# Patient Record
Sex: Male | Born: 1935
Health system: Southern US, Community
[De-identification: ages and names within clinical notes are randomized; demographics above are authoritative.]

## PROBLEM LIST (undated history)

## (undated) DIAGNOSIS — G609 Hereditary and idiopathic neuropathy, unspecified: Secondary | ICD-10-CM

## (undated) DIAGNOSIS — G4733 Obstructive sleep apnea (adult) (pediatric): Secondary | ICD-10-CM

## (undated) DIAGNOSIS — I2510 Atherosclerotic heart disease of native coronary artery without angina pectoris: Secondary | ICD-10-CM

## (undated) DIAGNOSIS — IMO0002 Reserved for concepts with insufficient information to code with codable children: Secondary | ICD-10-CM

## (undated) DIAGNOSIS — G2581 Restless legs syndrome: Secondary | ICD-10-CM

## (undated) DIAGNOSIS — I10 Essential (primary) hypertension: Secondary | ICD-10-CM

## (undated) DIAGNOSIS — M25559 Pain in unspecified hip: Secondary | ICD-10-CM

## (undated) DIAGNOSIS — L02419 Cutaneous abscess of limb, unspecified: Secondary | ICD-10-CM

## (undated) DIAGNOSIS — J189 Pneumonia, unspecified organism: Secondary | ICD-10-CM

## (undated) DIAGNOSIS — L03119 Cellulitis of unspecified part of limb: Secondary | ICD-10-CM

## (undated) DIAGNOSIS — E119 Type 2 diabetes mellitus without complications: Secondary | ICD-10-CM

## (undated) DIAGNOSIS — I319 Disease of pericardium, unspecified: Secondary | ICD-10-CM

## (undated) HISTORY — DX: Cutaneous abscess of limb, unspecified: L02.419

## (undated) HISTORY — DX: Reserved for concepts with insufficient information to code with codable children: IMO0002

## (undated) HISTORY — DX: Restless legs syndrome: G25.81

## (undated) HISTORY — DX: Hereditary and idiopathic neuropathy, unspecified: G60.9

## (undated) HISTORY — DX: Obstructive sleep apnea (adult) (pediatric): G47.33

## (undated) HISTORY — DX: Disease of pericardium, unspecified: I31.9

## (undated) HISTORY — DX: Pneumonia, unspecified organism: J18.9

## (undated) HISTORY — DX: Type 2 diabetes mellitus without complications: E11.9

## (undated) HISTORY — PX: ANAL FISSURE REPAIR: SHX2312

## (undated) HISTORY — DX: Cutaneous abscess of limb, unspecified: L03.119

## (undated) HISTORY — DX: Pain in unspecified hip: M25.559

---

## 1999-10-11 ENCOUNTER — Ambulatory Visit (HOSPITAL_BASED_OUTPATIENT_CLINIC_OR_DEPARTMENT_OTHER): Admission: RE | Admit: 1999-10-11 | Discharge: 1999-10-11 | Payer: Self-pay | Admitting: Pulmonary Disease

## 1999-12-21 ENCOUNTER — Encounter: Admission: RE | Admit: 1999-12-21 | Discharge: 2000-03-20 | Payer: Self-pay | Admitting: Pulmonary Disease

## 2000-05-01 ENCOUNTER — Encounter: Admission: RE | Admit: 2000-05-01 | Discharge: 2000-07-30 | Payer: Self-pay | Admitting: Pulmonary Disease

## 2000-10-31 ENCOUNTER — Ambulatory Visit (HOSPITAL_COMMUNITY): Admission: RE | Admit: 2000-10-31 | Discharge: 2000-10-31 | Payer: Self-pay | Admitting: Gastroenterology

## 2000-10-31 ENCOUNTER — Encounter (INDEPENDENT_AMBULATORY_CARE_PROVIDER_SITE_OTHER): Payer: Self-pay | Admitting: Specialist

## 2004-02-24 ENCOUNTER — Ambulatory Visit: Payer: Self-pay | Admitting: Internal Medicine

## 2004-02-27 ENCOUNTER — Ambulatory Visit: Payer: Self-pay | Admitting: Internal Medicine

## 2004-02-27 ENCOUNTER — Ambulatory Visit: Payer: Self-pay | Admitting: Professional

## 2004-03-19 ENCOUNTER — Ambulatory Visit: Payer: Self-pay | Admitting: Professional

## 2004-04-16 ENCOUNTER — Ambulatory Visit: Payer: Self-pay | Admitting: Professional

## 2004-05-21 ENCOUNTER — Ambulatory Visit: Payer: Self-pay | Admitting: Professional

## 2004-05-21 ENCOUNTER — Ambulatory Visit: Payer: Self-pay | Admitting: Internal Medicine

## 2004-05-28 ENCOUNTER — Ambulatory Visit: Payer: Self-pay | Admitting: Internal Medicine

## 2004-06-18 ENCOUNTER — Ambulatory Visit: Payer: Self-pay | Admitting: Professional

## 2004-07-19 ENCOUNTER — Ambulatory Visit: Payer: Self-pay | Admitting: Professional

## 2004-08-01 ENCOUNTER — Ambulatory Visit: Payer: Self-pay | Admitting: Pulmonary Disease

## 2004-08-30 ENCOUNTER — Ambulatory Visit: Payer: Self-pay | Admitting: Professional

## 2004-09-20 ENCOUNTER — Ambulatory Visit: Payer: Self-pay | Admitting: Internal Medicine

## 2004-09-25 ENCOUNTER — Ambulatory Visit: Payer: Self-pay | Admitting: Internal Medicine

## 2004-10-15 ENCOUNTER — Ambulatory Visit: Payer: Self-pay | Admitting: Professional

## 2004-11-26 ENCOUNTER — Ambulatory Visit: Payer: Self-pay | Admitting: Professional

## 2004-12-24 ENCOUNTER — Ambulatory Visit: Payer: Self-pay | Admitting: Internal Medicine

## 2004-12-24 ENCOUNTER — Ambulatory Visit: Payer: Self-pay | Admitting: Professional

## 2004-12-26 ENCOUNTER — Ambulatory Visit: Payer: Self-pay | Admitting: Internal Medicine

## 2005-01-17 ENCOUNTER — Ambulatory Visit: Payer: Self-pay

## 2005-02-04 ENCOUNTER — Ambulatory Visit: Payer: Self-pay | Admitting: Professional

## 2005-03-21 ENCOUNTER — Ambulatory Visit: Payer: Self-pay | Admitting: Internal Medicine

## 2005-03-27 ENCOUNTER — Ambulatory Visit: Payer: Self-pay | Admitting: Internal Medicine

## 2005-06-24 ENCOUNTER — Ambulatory Visit: Payer: Self-pay | Admitting: Internal Medicine

## 2005-06-26 ENCOUNTER — Ambulatory Visit: Payer: Self-pay | Admitting: Internal Medicine

## 2005-09-24 ENCOUNTER — Ambulatory Visit: Payer: Self-pay | Admitting: Internal Medicine

## 2005-09-27 ENCOUNTER — Ambulatory Visit: Payer: Self-pay | Admitting: Internal Medicine

## 2005-12-27 ENCOUNTER — Ambulatory Visit: Payer: Self-pay | Admitting: Internal Medicine

## 2005-12-31 ENCOUNTER — Ambulatory Visit: Payer: Self-pay | Admitting: Internal Medicine

## 2006-03-18 ENCOUNTER — Ambulatory Visit: Payer: Self-pay | Admitting: Internal Medicine

## 2006-03-18 LAB — CONVERTED CEMR LAB
ALT: 36 units/L (ref 0–40)
Albumin: 4.2 g/dL (ref 3.5–5.2)
Alkaline Phosphatase: 46 units/L (ref 39–117)
BUN: 24 mg/dL — ABNORMAL HIGH (ref 6–23)
CO2: 31 meq/L (ref 19–32)
GFR calc non Af Amer: 54 mL/min
Glucose, Bld: 136 mg/dL — ABNORMAL HIGH (ref 70–99)
Hgb A1c MFr Bld: 6.4 % — ABNORMAL HIGH (ref 4.6–6.0)
Potassium: 3.8 meq/L (ref 3.5–5.1)
Total Bilirubin: 1 mg/dL (ref 0.3–1.2)
Total Protein: 6.9 g/dL (ref 6.0–8.3)

## 2006-03-25 ENCOUNTER — Ambulatory Visit: Payer: Self-pay | Admitting: Internal Medicine

## 2006-06-19 ENCOUNTER — Ambulatory Visit: Payer: Self-pay | Admitting: Internal Medicine

## 2006-06-19 LAB — CONVERTED CEMR LAB
Bilirubin, Direct: 0.1 mg/dL (ref 0.0–0.3)
Cholesterol: 160 mg/dL (ref 0–200)
Direct LDL: 92.1 mg/dL
GFR calc Af Amer: 96 mL/min
GFR calc non Af Amer: 79 mL/min
Glucose, Bld: 127 mg/dL — ABNORMAL HIGH (ref 70–99)
Hgb A1c MFr Bld: 6.4 % — ABNORMAL HIGH (ref 4.6–6.0)
Sodium: 142 meq/L (ref 135–145)
TSH: 1.18 microintl units/mL (ref 0.35–5.50)
Total CHOL/HDL Ratio: 4.7
Triglycerides: 238 mg/dL (ref 0–149)
Vit D, 1,25-Dihydroxy: 9 — ABNORMAL LOW (ref 20–57)

## 2006-06-24 ENCOUNTER — Ambulatory Visit: Payer: Self-pay | Admitting: Internal Medicine

## 2006-09-18 ENCOUNTER — Ambulatory Visit: Payer: Self-pay | Admitting: Internal Medicine

## 2006-09-18 LAB — CONVERTED CEMR LAB
BUN: 15 mg/dL (ref 6–23)
Basophils Relative: 0.3 % (ref 0.0–1.0)
CO2: 33 meq/L — ABNORMAL HIGH (ref 19–32)
Calcium: 9.9 mg/dL (ref 8.4–10.5)
Creatinine, Ser: 0.9 mg/dL (ref 0.4–1.5)
GFR calc Af Amer: 108 mL/min
Monocytes Relative: 6.8 % (ref 3.0–11.0)
Neutro Abs: 5.8 10*3/uL (ref 1.4–7.7)
Platelets: 159 10*3/uL (ref 150–400)
RDW: 13 % (ref 11.5–14.6)
Saturation Ratios: 16.6 % — ABNORMAL LOW (ref 20.0–50.0)
Transferrin: 296.5 mg/dL (ref >=212.0)

## 2006-09-19 ENCOUNTER — Encounter: Payer: Self-pay | Admitting: Internal Medicine

## 2006-09-19 LAB — CONVERTED CEMR LAB: PTH: 7.8 pg/mL — ABNORMAL LOW (ref 14.0–72.0)

## 2006-09-24 ENCOUNTER — Ambulatory Visit: Payer: Self-pay | Admitting: Internal Medicine

## 2006-11-06 DIAGNOSIS — G4733 Obstructive sleep apnea (adult) (pediatric): Secondary | ICD-10-CM | POA: Insufficient documentation

## 2006-11-06 DIAGNOSIS — E119 Type 2 diabetes mellitus without complications: Secondary | ICD-10-CM | POA: Insufficient documentation

## 2006-11-06 DIAGNOSIS — E559 Vitamin D deficiency, unspecified: Secondary | ICD-10-CM | POA: Insufficient documentation

## 2006-11-06 DIAGNOSIS — F411 Generalized anxiety disorder: Secondary | ICD-10-CM | POA: Insufficient documentation

## 2006-11-06 DIAGNOSIS — I10 Essential (primary) hypertension: Secondary | ICD-10-CM | POA: Insufficient documentation

## 2006-11-06 DIAGNOSIS — G4700 Insomnia, unspecified: Secondary | ICD-10-CM

## 2006-11-06 DIAGNOSIS — G2581 Restless legs syndrome: Secondary | ICD-10-CM | POA: Insufficient documentation

## 2006-11-06 DIAGNOSIS — M1990 Unspecified osteoarthritis, unspecified site: Secondary | ICD-10-CM | POA: Insufficient documentation

## 2006-11-06 DIAGNOSIS — B009 Herpesviral infection, unspecified: Secondary | ICD-10-CM | POA: Insufficient documentation

## 2006-11-06 DIAGNOSIS — L719 Rosacea, unspecified: Secondary | ICD-10-CM

## 2006-11-06 DIAGNOSIS — Z860100 Personal history of colon polyps, unspecified: Secondary | ICD-10-CM | POA: Insufficient documentation

## 2006-11-06 DIAGNOSIS — E291 Testicular hypofunction: Secondary | ICD-10-CM

## 2006-11-06 DIAGNOSIS — R7309 Other abnormal glucose: Secondary | ICD-10-CM

## 2006-11-06 DIAGNOSIS — N400 Enlarged prostate without lower urinary tract symptoms: Secondary | ICD-10-CM

## 2007-04-09 HISTORY — PX: OTHER SURGICAL HISTORY: SHX169

## 2007-08-25 ENCOUNTER — Encounter: Payer: Self-pay | Admitting: Internal Medicine

## 2008-02-01 ENCOUNTER — Encounter: Payer: Self-pay | Admitting: Internal Medicine

## 2008-04-25 ENCOUNTER — Encounter: Payer: Self-pay | Admitting: Internal Medicine

## 2008-11-01 ENCOUNTER — Encounter: Payer: Self-pay | Admitting: Internal Medicine

## 2010-02-09 ENCOUNTER — Encounter: Admission: RE | Admit: 2010-02-09 | Discharge: 2010-02-09 | Payer: Self-pay | Admitting: Neurology

## 2010-02-09 IMAGING — CT CT L SPINE W/O CM
4 of 11 series · 10 of 33 positions shown, 12 images · non-contrast
Comparison: None.

CLINICAL DATA: Right lumbar radiculopathy.

CT LUMBAR SPINE WITHOUT CONTRAST
TECHNIQUE: Multidetector CT imaging of the lumbar spine was
performed without intravenous contrast administration. Multiplanar
CT image reconstructions were also generated.

[Series 3: l spine bone · axial · 0.27mm/px · z∈[-299,-219]mm · 2 of 97 slices shown, 3 images]
[im 33/97  soft-tissue]
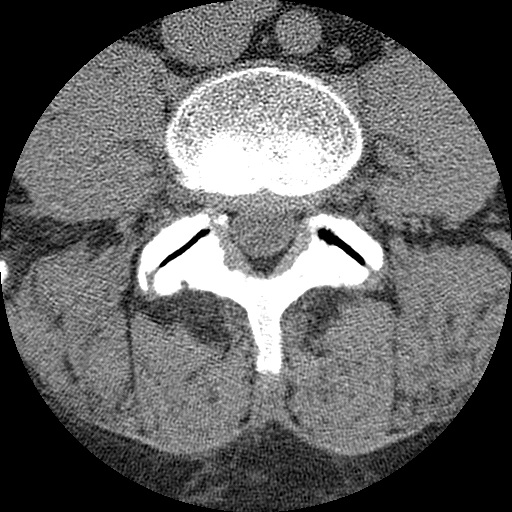
[im 33/97  bone]
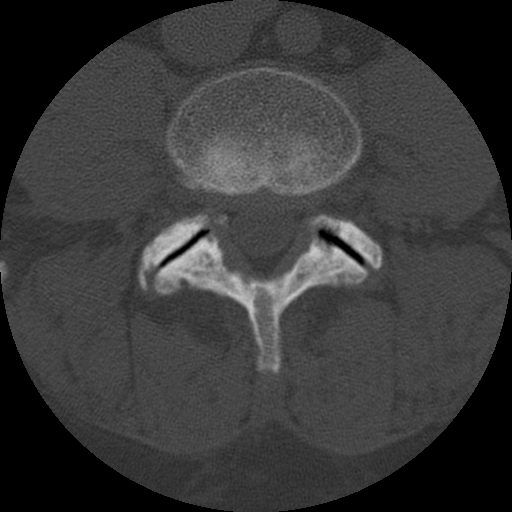
[im 65/97  bone]
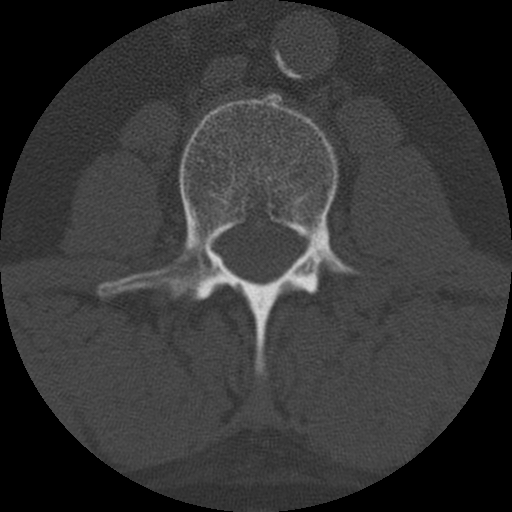

[Series 4: l spine soft · axial · 0.27mm/px · z∈[-299,-219]mm · 2 of 97 slices shown]
[im 33/97  soft-tissue]
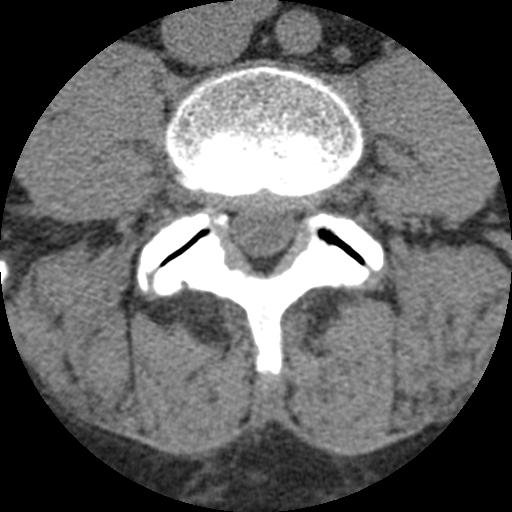
[im 65/97  soft-tissue]
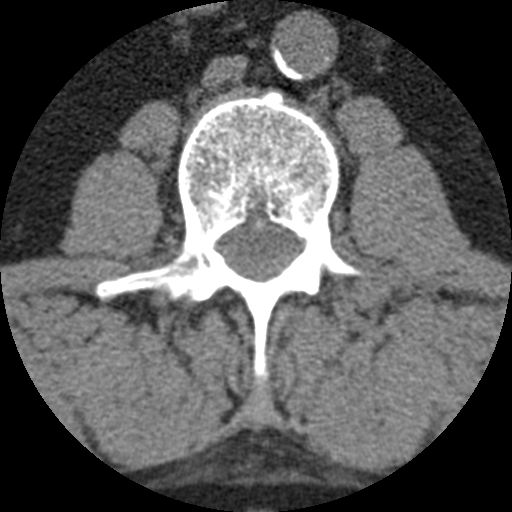

[Series 201: cor lower l-spine · coronal · 0.48mm/px · 1 of 54 slices shown]
[im 27/54  bone]
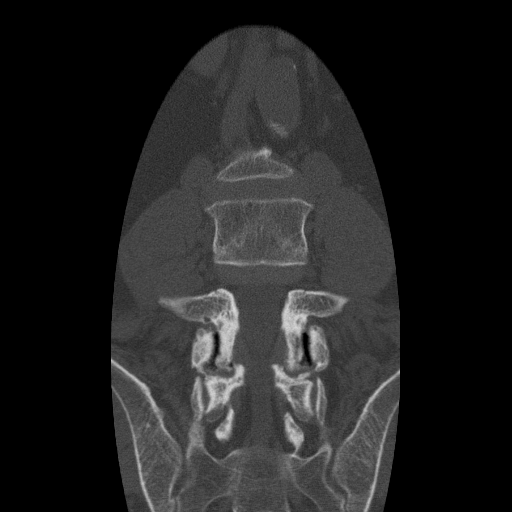

[Series 202: sag l-spine · sagittal · 0.48mm/px · 5 of 55 slices shown, 6 images]
[im 19/55  bone]
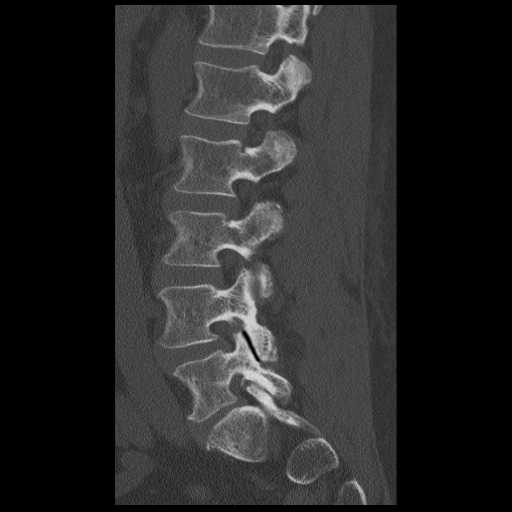
[im 23/55  bone]
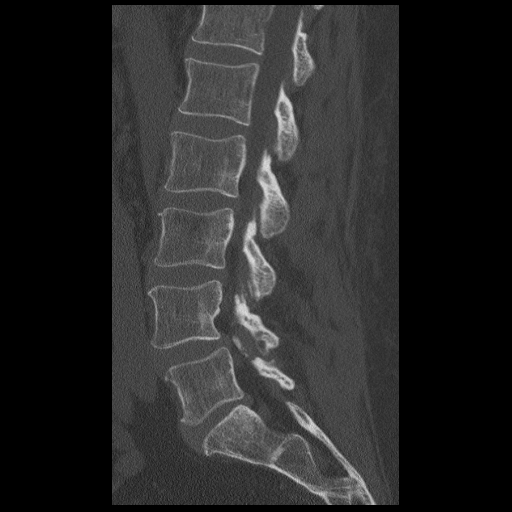
[im 28/55  soft-tissue]
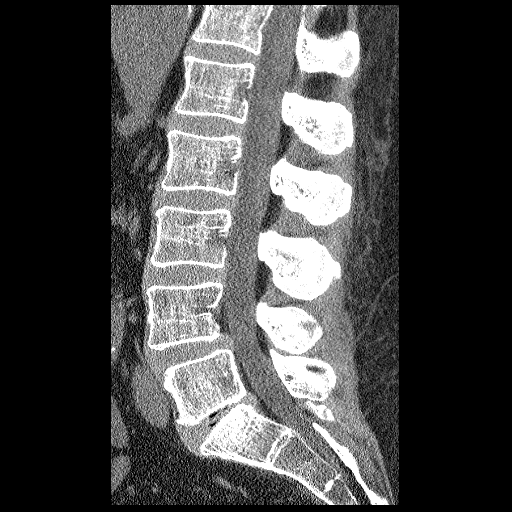
[im 28/55  bone]
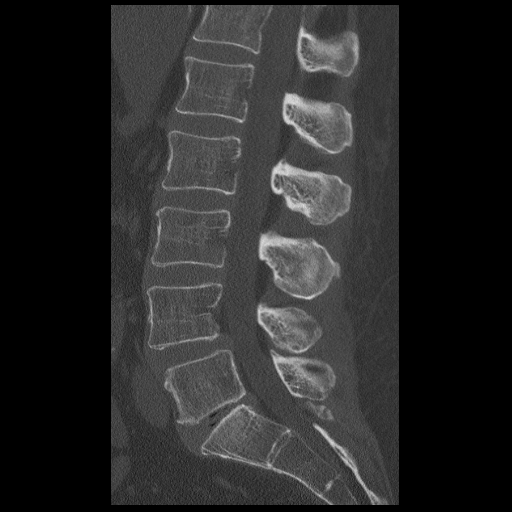
[im 32/55  bone]
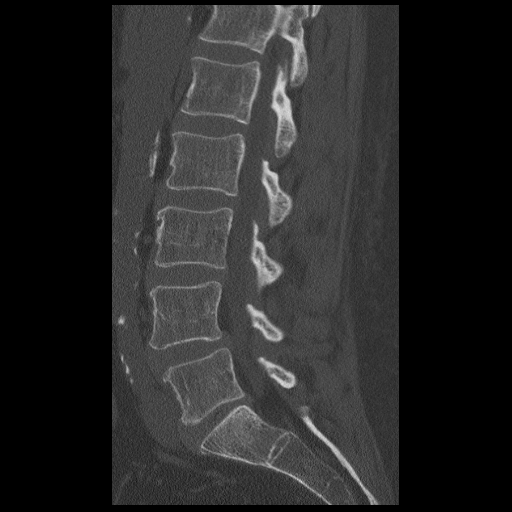
[im 37/55  bone]
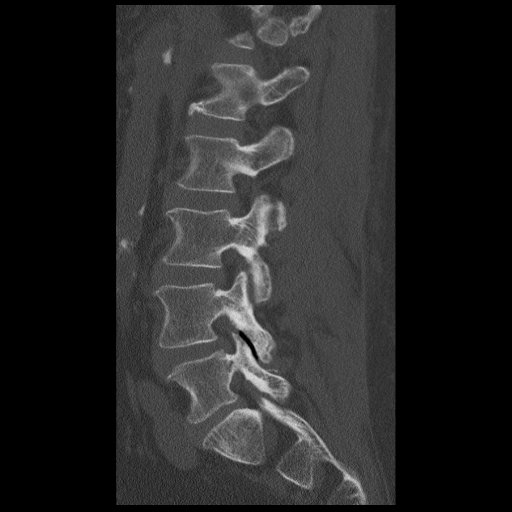

[10 of 33 positions shown; findings below may reference images not displayed]

FINDINGS: Anatomic alignment is present except for 1-2 mm
anterolisthesis L4 on L5.  Coarsening of the trabecula in the
lumbar vertebrae suggests osteopenia.  Correlate with bone density
study.  Vascular calcification without aneurysmal dilatation.  No
prevertebral or paraspinous masses.

The individual disc spaces are examined as follows:

L1-2:  Normal.

L2-3:  Normal.

L3-4:  Mild bulge.  Mild facet arthropathy.

L4-5:  1-2 mm anterolisthesis, facet mediated.  Central bulging
annular fibers.  Severe facet arthropathy and ligamentum flavum
hypertrophy.  Possible L4 and L5 nerve root encroachment, worse on
the right.

L5-S1:  Shallow central and rightward protrusion.  Mild disc space
narrowing.  Right-sided facet arthropathy.  No definite S1 nerve
root encroachment in the canal.  Asymmetric foraminal narrowing on
the right likely compresses the L5 nerve root.
IMPRESSION: Possible symptomatic right-sided neural compression at L4-5 and L5-
S1; see comments above.

1-2 mm facet mediated slip L4-L5.

Coarsening of the lumbar trabeculae could suggest osteopenia.

## 2010-02-28 ENCOUNTER — Encounter
Admission: RE | Admit: 2010-02-28 | Discharge: 2010-04-03 | Payer: Self-pay | Source: Home / Self Care | Attending: Neurology | Admitting: Neurology

## 2010-04-03 ENCOUNTER — Encounter
Admission: RE | Admit: 2010-04-03 | Discharge: 2010-05-08 | Payer: Self-pay | Source: Home / Self Care | Attending: Neurology | Admitting: Neurology

## 2010-08-24 NOTE — Assessment & Plan Note (Signed)
[facility]                                 ON-CALL NOTE   CARNELIUS, HAMMITT                          MRN:          [id number]  DATE:06/04/2006                            DOB:          09/21/1935    Patient named Jason Hartman, caller is Sharlet Salina.  June 04, 2006  at 7:30 a.m. phone number is [phone].  Patient of Dr. Posey Rea. I am  Dr. Milinda Antis on call.   CHIEF COMPLAINT:  ? Blood clot in the leg.   His wife states that woke up yesterday with fever, aches all over, fever  of 102-103.  By the end of the day his leg was kind of patchy red and  swollen and tight.  She gave him Tylenol, Motrin and Advil on and off  through the day.  The fever went down now, the leg redness is worse and  the swelling is worse.  He has had acupuncture recently and has a remote  history of a skin infection.  I told her to take him to the emergency  room now for evaluation and that is what she is going to do.     Marne A. Tower, MD  Electronically Signed    MAT/MedQ  DD: 06/04/2006  DT: 06/04/2006  Job #: [id number]   cc:   Georgina Quint. Plotnikov, MD

## 2012-07-10 ENCOUNTER — Encounter: Payer: Self-pay | Admitting: Neurology

## 2012-09-01 ENCOUNTER — Ambulatory Visit (INDEPENDENT_AMBULATORY_CARE_PROVIDER_SITE_OTHER): Payer: BC Managed Care – PPO | Admitting: Neurology

## 2012-09-01 ENCOUNTER — Encounter: Payer: Self-pay | Admitting: Neurology

## 2012-09-01 VITALS — BP 156/89 | HR 81 | Temp 98.5°F | Ht 70.0 in | Wt 215.0 lb

## 2012-09-01 DIAGNOSIS — R269 Unspecified abnormalities of gait and mobility: Secondary | ICD-10-CM

## 2012-09-01 DIAGNOSIS — G609 Hereditary and idiopathic neuropathy, unspecified: Secondary | ICD-10-CM

## 2012-09-01 DIAGNOSIS — G2581 Restless legs syndrome: Secondary | ICD-10-CM

## 2012-09-01 HISTORY — DX: Restless legs syndrome: G25.81

## 2012-09-01 MED ORDER — GABAPENTIN ENACARBIL ER 600 MG PO TBCR: 600.0000 mg | EXTENDED_RELEASE_TABLET | Freq: Two times a day (BID) | ORAL | Status: DC

## 2012-09-01 MED ORDER — ROTIGOTINE 6 MG/24HR TD PT24: 1.0000 | MEDICATED_PATCH | Freq: Every day | TRANSDERMAL | Status: DC

## 2012-09-01 MED ORDER — METANX 3-35-2 MG PO TABS: 1.0000 | ORAL_TABLET | Freq: Two times a day (BID) | ORAL | Status: DC

## 2012-09-01 MED ORDER — L-METHYLFOLATE-B6-B12 3-35-2 MG PO TABS: 1.0000 | ORAL_TABLET | Freq: Two times a day (BID) | ORAL | Status: DC

## 2012-09-01 NOTE — Patient Instructions (Addendum)
I think overall you are doing fairly well but I do want to suggest a few things today:  Remember to drink plenty of fluid, eat healthy meals and do not skip any meals. Try to eat protein with a every meal and eat a healthy snack such as fruit or nuts in between meals. Try to keep a regular sleep-wake schedule and try to exercise daily, particularly in the form of walking, 20-30 minutes a day, if you can.   As far as your medications are concerned, I would like to suggest no changes.    As far as diagnostic testing: no new test.   I would like to see you back in 6 months, sooner if we need to. Please call us with any interim questions, concerns, problems, updates or refill requests.  Steve is my clinical assistant and will answer any of your questions and relay your messages to me and also relay most of my messages to you.  Our phone number is [phone]. We also have an after hours call service for urgent matters and there is a physician on-call for urgent questions. For any emergencies you know to call 911 or go to the nearest emergency room.    

## 2012-09-01 NOTE — Progress Notes (Signed)
Subjective:    Patient ID: Jason Hartman is a 77 y.o. male.  HPI  Interim history:   Jason Hartman is a 77 year old left-handed gentleman who presents for followup consultation of his restless leg syndrome. He is accompanied by his wife today. This is his first visit with me and he previously followed with Dr. Fayrene Fearing love and was last seen by him on 11/05/2011, and which time Dr. Sandria Manly increased his Neupro to 6 mg daily. He had difficulty with Horizant in the past. Dr. Sandria Manly felt that his flareup of restless leg symptoms were secondary to a decrease in Valium dose and decrease in narcotic pain medication which were decrease while he was in the hospital for pneumonia and cellulitis. He has an underlying medical history of diabetes, restless leg syndrome, high cholesterol, depression, hypertension, recent pneumonia and cellulitis. He is currently on Neupro, doxycycline, metformin, Lovaza, Horizant, Bystolic, Metanx, magnesium, vitamin E, vitamin D, Pepcid, tamsulosin, Diovan, acyclovir, testosterone, Lantus, hydrocodone, baby aspirin, niacin, Flonase, systane.  I reviewed Dr. Imagene Gurney prior notes and the patient's records and below is a summary of that review:  77 year old left-handed gentleman with a at least 15 year history of RLS treated initially with Ultram, Sinemet, Mirapex, Permax, clonazepam, Neurontin, amitriptyline, Sonata, Seroquel, Darvocet, iron IV, Vicodin, Gabitril, Valium, Zoloft, Lexapro, Remeron, Lorcet, alprazolam, tryptophan, Risperdal, oxycodone, morphine, Tylenol No. 4, Demerol, methadone, Wellbutrin. He denies compulsive behavior or augmentation. He does have side effects with Horizant including grogginess. He has obstructive sleep apnea and is on CPAP. There is no family history of RLS. He was evaluated for left hip pain in the past at [facility]. In 2090 abnormal CMP, SEP, TSH, T4, testosterone, ACE level, B12, folate, ESR, paraneoplastic antibodies, ferritin. He was found to have low  parathyroid hormone and low in the. He has a history of pericarditis. CT lumbar spine in November 2011 showed anterolisthesis with degenerative disc disease at L4-5, as well as L5-S1. His RLS symptoms began around 4:30 PM. He is sleepy during the day.  The combination of Metanx, neupro 6 mg and Horizant is working fairly well for him. He has occasional pain in his hands, but also likes to hunt and has repetitive activity with his hands while hunting. He walks regularly and uses a walking stick. He has had some unsteadiness while walking but has not fallen recently. His symptoms of RLS are mainly at night.    His Past Medical History Is Significant For: Past Medical History  Diagnosis Date  . Pneumonia, organism unspecified   . Cellulitis and abscess of leg, except foot   . Obstructive sleep apnea (adult) (pediatric)   . Pain in joint, pelvic region and thigh   . Thoracic or lumbosacral neuritis or radiculitis, unspecified   . Unspecified hereditary and idiopathic peripheral neuropathy   . Unspecified disease of pericardium   . Type II or unspecified type diabetes mellitus without mention of complication, not stated as uncontrolled   . Restless legs syndrome (RLS)   . RLS (restless legs syndrome) 09/01/2012    His Past Surgical History Is Significant For: Past Surgical History  Procedure Laterality Date  . Pericarditis  2009    His Family History Is Significant For: Family History  Problem Relation Age of Onset  . Diabetes Father     His Social History Is Significant For: History   Social History  . Marital Status: Married    Spouse Name: N/A    Number of Children: N/A  . Years  of Education: N/A   Social History Main Topics  . Smoking status: Former Smoker    Quit date: 09/01/1980  . Smokeless tobacco: None  . Alcohol Use: No  . Drug Use: No  . Sexually Active: None   Other Topics Concern  . None   Social History Narrative  . None    His Allergies Are:   Allergies  Allergen Reactions  . Iohexol      Code: HIVES, Desc: alleric to renografin,isovue & omnipaque, hives, requires 13 hr prep//a.calhoun, Onset Date: [id number]   . Morphine Sulfate   . Oxycodone Hcl   . Penicillins   . Quetiapine   :   His Current Medications Are:  Outpatient Encounter Prescriptions as of 09/01/2012  Medication Sig Dispense Refill  . amLODipine (NORVASC) 5 MG tablet       . BYSTOLIC 5 MG tablet       . clindamycin (CLEOCIN) 300 MG capsule       . DIOVAN 320 MG tablet       . Gabapentin Enacarbil (HORIZANT) 600 MG TB24 Take 600 mg by mouth 2 (two) times daily.  180 tablet  3  . HYDROcodone-acetaminophen (NORCO/VICODIN) 5-325 MG per tablet       . l-methylfolate-B6-B12 (METANX) 3-35-2 MG TABS Take 1 tablet by mouth 2 (two) times daily.  180 tablet  3  . LANTUS SOLOSTAR 100 UNIT/ML SOPN       . metFORMIN (GLUCOPHAGE-XR) 500 MG 24 hr tablet       . NOVOFINE 32G X 6 MM MISC       . ONE TOUCH ULTRA TEST test strip       . ONETOUCH DELICA LANCETS 33G MISC       . rotigotine (NEUPRO) 6 MG/24HR Place 1 patch onto the skin daily.  90 patch  3  . tamsulosin (FLOMAX) 0.4 MG CAPS       . triamcinolone lotion (KENALOG) 0.1 %       . [DISCONTINUED] HORIZANT 600 MG TB24       . [DISCONTINUED] l-methylfolate-B6-B12 (METANX) 3-35-2 MG TABS Take 1 tablet by mouth 2 (two) times daily.      . [DISCONTINUED] NEUPRO 6 MG/24HR       . multivitamin (METANX) 3-35-2 MG TABS tablet Take 1 tablet by mouth 2 (two) times daily.  180 tablet  3  . [DISCONTINUED] STENDRA 100 MG TABS        No facility-administered encounter medications on file as of 09/01/2012.    Review of Systems  Constitutional: Positive for fatigue.  Respiratory:       Snoring  Genitourinary: Positive for difficulty urinating.  Musculoskeletal: Positive for joint swelling and arthralgias.       Cramps  Neurological: Positive for weakness.       Memory loss, restless leg  Psychiatric/Behavioral: Positive for  sleep disturbance. The patient is nervous/anxious.     Objective:  Neurologic Exam  Physical Exam Physical Examination:   Filed Vitals:   09/01/12 1555  BP: 156/89  Pulse: 81  Temp: 98.5 F (36.9 C)    General Examination: The patient is a very pleasant 77 y.o. male in no acute distress. He appears well-developed and well-nourished and well groomed.   HEENT: Normocephalic, atraumatic, pupils are equal, round and reactive to light and accommodation. Funduscopic exam is normal with sharp disc margins noted. Extraocular tracking is good without limitation to gaze excursion or nystagmus noted. Normal smooth pursuit is noted. Hearing is grossly intact.  Tympanic membranes are clear bilaterally. Face is symmetric with normal facial animation and normal facial sensation. Speech is clear with no dysarthria noted. There is no hypophonia. There is no lip, neck/head, jaw or voice tremor. Neck is supple with full range of passive and active motion. There are no carotid bruits on auscultation. Oropharynx exam reveals: mild mouth dryness, good dental hygiene and mild airway crowding. Mallampati is class II. Tongue protrudes centrally and palate elevates symmetrically.   Chest: Clear to auscultation without wheezing, rhonchi or crackles noted.  Heart: S1+S2+0, regular and normal without murmurs, rubs or gallops noted.   Abdomen: Soft, non-tender and non-distended with normal bowel sounds appreciated on auscultation.  Extremities: There is no pitting edema in the distal lower extremities bilaterally. Pedal pulses are intact.  Skin: Warm and dry without trophic changes noted. There are no varicose veins.  Musculoskeletal: exam reveals no obvious joint deformities, tenderness or joint swelling or erythema.   Neurologically:  Mental status: The patient is awake, alert and oriented in all 4 spheres. His memory, attention, language and knowledge are appropriate. There is no aphasia, agnosia, apraxia or  anomia. Speech is clear with normal prosody and enunciation. Thought process is linear. Mood is congruent and affect is normal.  Cranial nerves are as described above under HEENT exam. In addition, shoulder shrug is normal with equal shoulder height noted. Motor exam: Normal bulk, strength and tone is noted. There is no drift, tremor or rebound. Romberg is negative. Reflexes are 2+ throughout. Toes are downgoing bilaterally. Fine motor skills are intact with normal finger taps, normal hand movements, normal rapid alternating patting, normal foot taps and normal foot agility.  Cerebellar testing shows no dysmetria or intention tremor on finger to nose testing. Heel to shin is unremarkable bilaterally. There is no truncal or gait ataxia.  Sensory exam is intact to light touch, pinprick, vibration, temperature sense in the upper  extremities and there is mild decrease in vibration and temperature and pinprick sense in the distal lower extremities. There is only a very mild discrepancy between the sensation in the upper and lower extremities though. He was diagnosed with neuropathy in the past. This has not progressed and his perception.   Gait, station and balance are unremarkable with the exception of difficulty with walking in a straight line. No veering to one side is noted. No leaning to one side is noted. Posture is age-appropriate and stance is narrow based. No problems turning are noted. He turns en bloc. Tandem walk is difficult for him. Intact toe and heel stance is noted.               Assessment and Plan:   Assessment and Plan:  In summary, LOWELL MAKARA is a very pleasant 77 y.o.-year old male with a history of Restless leg syndrome and mild neuropathy. Lately he has done well with a combination of Metanx bid and Horizant 600 mg to 1200 mg daily and Neupro 6 mg. His exam appears stable and he is reassured in that regard. I did not recommend any medication changes today. I renewed all his  prescriptions today and encouraged him to continue walking regularly. He is advised to use his walking stick at all times. He has mild gait unsteadiness but this may be medication effect as well. He is advised to drink plenty of fluid and exercise regularly. I would like to see him back in 6 months from now, sooner if the need arises and encouraged them to call  with any interim questions, concerns, problems or updates and refill requests.

## 2012-11-23 ENCOUNTER — Ambulatory Visit: Payer: BC Managed Care – PPO | Admitting: Neurology

## 2012-12-17 ENCOUNTER — Ambulatory Visit (INDEPENDENT_AMBULATORY_CARE_PROVIDER_SITE_OTHER): Payer: BC Managed Care – PPO | Admitting: Neurology

## 2012-12-17 ENCOUNTER — Encounter: Payer: Self-pay | Admitting: Neurology

## 2012-12-17 VITALS — BP 159/84 | HR 78 | Temp 97.1°F | Ht 70.0 in | Wt 216.5 lb

## 2012-12-17 DIAGNOSIS — G609 Hereditary and idiopathic neuropathy, unspecified: Secondary | ICD-10-CM

## 2012-12-17 DIAGNOSIS — G2581 Restless legs syndrome: Secondary | ICD-10-CM

## 2012-12-17 DIAGNOSIS — R269 Unspecified abnormalities of gait and mobility: Secondary | ICD-10-CM

## 2012-12-17 NOTE — Progress Notes (Signed)
Subjective:    Patient ID: Jason Hartman is a 77 y.o. male.  HPI  Interim history:   Jason Hartman is a 77 year old left-handed gentleman who presents for followup consultation of his restless leg syndrome. He is unaccompanied today. I first met him on 09/01/2012, and which time he was doing fairly well with a combination of Metanx bid and Horizant 600 mg to 1200 mg daily and Neupro 6 mg. His exam was stable and I did not recommend any medication changes. I renewed all his prescriptions and encouraged him to continue walking regularly. He was advised to use his walking stick at all times. He had mild gait unsteadiness and evidence of PN. He presents for a sooner than his scheduled 6 month appointment d/t c/o lack of energy. He reports more difficulty with compliance with CPAP and he feels extreme fatigue recently. He is affected by the heat and in May he was able to walk 3 miles, but now he is able to walk only 1 mile. Walking indoors in a Gym helps a little. He yawns a lot. He had a recent stress test, which per him was normal. He has a FU on 12/22/12. He saw his PCP and was referred to PT, and he has been released from PT yesterday. He fell recently while walking outside. He has had a stomach pain and reports a bleeding peptic ulcer years ago. He has an appointment with GI. He has no BRBPR, no recent fevers or chills. He sweats a lot at night and sometimes has to change his clothes at night. He has had blood work through his PCP and his endocrinologist at [facility].   He previously followed with Dr. Fayrene Fearing love and was last seen by him on 11/05/2011, at which time Dr. Sandria Manly increased his Neupro to 6 mg daily. He had difficulty with Horizant in the past. Dr. Sandria Manly felt that his flareup of restless leg symptoms were secondary to a decrease in Valium dose and decrease in narcotic pain medication which were decreased while he was in the hospital for pneumonia and cellulitis. He has an underlying medical history of  diabetes, restless leg syndrome, high cholesterol, depression, hypertension, recent pneumonia and cellulitis. He is currently on Neupro, doxycycline, metformin, Lovaza, Horizant, Bystolic, Metanx, magnesium, vitamin E, vitamin D, Pepcid, tamsulosin, Diovan, acyclovir, testosterone, Lantus, hydrocodone, baby aspirin, niacin, Flonase, systane.  He has an at least 15 year history of RLS treated initially with Ultram, Sinemet, Mirapex, Permax, clonazepam, Neurontin, amitriptyline, Sonata, Seroquel, Darvocet, iron IV, Vicodin, Gabitril, Valium, Zoloft, Lexapro, Remeron, Lorcet, alprazolam, tryptophan, Risperdal, oxycodone, morphine, Tylenol No. 4, Demerol, methadone, Wellbutrin. He denies compulsive behavior or augmentation. He had side effects with Horizant including grogginess. He has obstructive sleep apnea and is on CPAP. There is no family history of RLS. He was evaluated for left hip pain in the past at [facility]. In 2090 abnormal CMP, SEP, TSH, T4, testosterone, ACE level, B12, folate, ESR, paraneoplastic antibodies, ferritin. He was found to have low parathyroid hormone and low in the. He has a history of pericarditis. CT lumbar spine in November 2011 showed anterolisthesis with degenerative disc disease at L4-5, as well as L5-S1. His RLS symptoms begin around 4:30 PM. He is sleepy during the day.   His Past Medical History Is Significant For: Past Medical History  Diagnosis Date  . Pneumonia, organism unspecified   . Cellulitis and abscess of leg, except foot   . Obstructive sleep apnea (adult) (pediatric)   . Pain  in joint, pelvic region and thigh   . Thoracic or lumbosacral neuritis or radiculitis, unspecified   . Unspecified hereditary and idiopathic peripheral neuropathy   . Unspecified disease of pericardium   . Type II or unspecified type diabetes mellitus without mention of complication, not stated as uncontrolled   . Restless legs syndrome (RLS)   . RLS (restless legs syndrome)  09/01/2012    His Past Surgical History Is Significant For: Past Surgical History  Procedure Laterality Date  . Pericarditis  2009    His Family History Is Significant For: Family History  Problem Relation Age of Onset  . Diabetes Father     His Social History Is Significant For: History   Social History  . Marital Status: Married    Spouse Name: Natalia Leatherwood    Number of Children: 2  . Years of Education: College   Occupational History  . CONTRACTOR    Social History Main Topics  . Smoking status: Former Smoker    Quit date: 09/01/1980  . Smokeless tobacco: None  . Alcohol Use: No  . Drug Use: No  . Sexual Activity: None   Other Topics Concern  . None   Social History Narrative   Patient lives at home with spouse.   Caffeine Use: occasionally    His Allergies Are:  Allergies  Allergen Reactions  . Iohexol      Code: HIVES, Desc: alleric to renografin,isovue & omnipaque, hives, requires 13 hr prep//a.calhoun, Onset Date: [id number]   . Morphine Sulfate   . Oxycodone Hcl   . Penicillins   . Quetiapine   :   His Current Medications Are:  Outpatient Encounter Prescriptions as of 12/17/2012  Medication Sig Dispense Refill  . amLODipine (NORVASC) 5 MG tablet       . aspirin EC 325 MG tablet Take 325 mg by mouth daily. 1-2 daily prn      . BYSTOLIC 5 MG tablet       . co-enzyme Q-10 50 MG capsule Take 50 mg by mouth daily.      Marland Kitchen DIOVAN 320 MG tablet       . Gabapentin Enacarbil (HORIZANT) 600 MG TB24 Take 600 mg by mouth 2 (two) times daily.  180 tablet  3  . HYDROcodone-acetaminophen (NORCO/VICODIN) 5-325 MG per tablet Take 1-2 tablets by mouth daily.       Marland Kitchen LANTUS SOLOSTAR 100 UNIT/ML SOPN       . metFORMIN (GLUCOPHAGE-XR) 500 MG 24 hr tablet       . multivitamin (METANX) 3-35-2 MG TABS tablet Take 1 tablet by mouth 2 (two) times daily.  180 tablet  3  . NOVOFINE 32G X 6 MM MISC       . omega-3 acid ethyl esters (LOVAZA) 1 G capsule Take 2 g by mouth 2  (two) times daily. 4 Tabs qd      . ONE TOUCH ULTRA TEST test strip       . ONETOUCH DELICA LANCETS 33G MISC       . rotigotine (NEUPRO) 6 MG/24HR Place 1 patch onto the skin daily.  90 patch  3  . STENDRA 200 MG TABS as needed.      . tamsulosin (FLOMAX) 0.4 MG CAPS       . triamcinolone lotion (KENALOG) 0.1 %       . [DISCONTINUED] clindamycin (CLEOCIN) 300 MG capsule       . [DISCONTINUED] l-methylfolate-B6-B12 (METANX) 3-35-2 MG TABS Take 1 tablet by mouth 2 (  two) times daily.  180 tablet  3   No facility-administered encounter medications on file as of 12/17/2012.  :  Review of Systems  Constitutional: Positive for fatigue.  Respiratory:       Snoring  Cardiovascular: Positive for chest pain.  Genitourinary: Positive for difficulty urinating (urination problems).  Neurological: Positive for dizziness.       Memory Loss Dizziness  Psychiatric/Behavioral: Sleep disturbance: RLS, insomnia.       Depression Not enough sleep Decreased Energy    Objective:  Neurologic Exam  Physical Exam Physical Examination:   Filed Vitals:   12/17/12 1434  BP: 159/84  Pulse: 78  Temp: 97.1 F (36.2 C)   General Examination: The patient is a very pleasant 77 y.o. male in no acute distress. He appears well-developed and well-nourished and well groomed. He is somewhat anxious today and his speech is pressured.   HEENT: Normocephalic, atraumatic, pupils are equal, round and reactive to light and accommodation. Extraocular tracking is good without limitation to gaze excursion or nystagmus noted. Normal smooth pursuit is noted. Hearing is grossly intact. Face is symmetric with normal facial animation and normal facial sensation. Speech is clear with no dysarthria noted. There is no hypophonia. There is no lip, neck/head, jaw or voice tremor. Neck is supple with full range of passive and active motion. There are no carotid bruits on auscultation. Oropharynx exam reveals: mild mouth dryness, good  dental hygiene and mild airway crowding. Mallampati is class II. Tongue protrudes centrally and palate elevates symmetrically.   Chest: Clear to auscultation without wheezing, rhonchi or crackles noted.  Heart: S1+S2+0, regular and normal without murmurs, rubs or gallops noted.   Abdomen: Soft, non-tender and non-distended with normal bowel sounds appreciated on auscultation.  Extremities: There is no pitting edema in the distal lower extremities bilaterally. Pedal pulses are intact.  Skin: Warm and dry without trophic changes noted. There are no varicose veins.  Musculoskeletal: exam reveals no obvious joint deformities, tenderness or joint swelling or erythema.   Neurologically:  Mental status: The patient is awake, alert and oriented in all 4 spheres. His memory, attention, language and knowledge are appropriate. There is no aphasia, agnosia, apraxia or anomia. Speech is clear with normal prosody and enunciation. Thought process is linear. Mood is congruent and affect is normal.  Cranial nerves are as described above under HEENT exam. In addition, shoulder shrug is normal with equal shoulder height noted. Motor exam: Normal bulk, strength and tone is noted. There is no drift, tremor or rebound. Romberg is negative. Reflexes are 2+ throughout. Fine motor skills are intact with normal finger taps, normal hand movements, normal rapid alternating patting, normal foot taps and normal foot agility.  Cerebellar testing shows no dysmetria or intention tremor on finger to nose testing. There is no truncal or gait ataxia.  Sensory exam is intact to light touch, pinprick, vibration, temperature sense in the upper extremities and there is mild decrease in vibration and temperature and pinprick sense in the distal lower extremities. There is only a very mild discrepancy between the sensation in the upper and lower extremities though. He was diagnosed with neuropathy in the past. This has not progressed and  his perception.   Gait, station and balance are unremarkable with the exception of difficulty with walking in a straight line. No veering to one side is noted. No leaning to one side is noted. Posture is age-appropriate and stance is narrow based. No problems turning are noted. He turns en  bloc. Tandem walk is difficult for him. Intact toe and heel stance is noted.               Assessment and Plan:   In summary, ZACKARY MCKEONE is a very pleasant 77 y.o.-year old male with a history of Restless leg syndrome and mild neuropathy with a hx of DDD. He presents with a number of vague and constitutional complaints today and has appointments pending for some of his issues. He has had some blood work from his PCP and his endocrinologist and I am not able to review those results today. He is advised to talk to his PCP about his Sx. I recommend, checking vitamin D level, ESR, RPR, CRP, TFTs, CK level, ANA, RF if not already checked.  From my end of things, he has done reasonably well with a combination of Metanx bid, Horizant 600 mg to 1200 mg daily and Neupro 6 mg. His exam seems stable and he is reassured in that regard. I did not recommend any medication changes today. I explained to him that his gait dysfunction is likely a combination of lower back disease, neuropathy, aging, and medication effects; some of his medications can be sedating. He is advised to use his walking stick at all times. He is advised to drink plenty of fluid and exercise regularly. I would like to see him back in 6 months from now, sooner if the need arises and encouraged them to call with any interim questions, concerns, problems or updates and refill requests.

## 2013-03-01 ENCOUNTER — Ambulatory Visit: Payer: BC Managed Care – PPO | Admitting: Neurology

## 2013-04-20 ENCOUNTER — Ambulatory Visit: Payer: BC Managed Care – PPO | Admitting: Neurology

## 2013-06-21 ENCOUNTER — Ambulatory Visit: Payer: BC Managed Care – PPO | Admitting: Neurology

## 2013-08-18 DIAGNOSIS — J984 Other disorders of lung: Secondary | ICD-10-CM | POA: Insufficient documentation

## 2013-08-18 DIAGNOSIS — L03115 Cellulitis of right lower limb: Secondary | ICD-10-CM | POA: Insufficient documentation

## 2013-08-18 DIAGNOSIS — G20 Parkinson's disease: Secondary | ICD-10-CM | POA: Insufficient documentation

## 2013-08-18 DIAGNOSIS — N529 Male erectile dysfunction, unspecified: Secondary | ICD-10-CM | POA: Insufficient documentation

## 2013-08-18 DIAGNOSIS — M545 Low back pain, unspecified: Secondary | ICD-10-CM | POA: Insufficient documentation

## 2013-08-18 DIAGNOSIS — R413 Other amnesia: Secondary | ICD-10-CM | POA: Insufficient documentation

## 2013-08-18 DIAGNOSIS — D696 Thrombocytopenia, unspecified: Secondary | ICD-10-CM | POA: Insufficient documentation

## 2013-08-18 DIAGNOSIS — D509 Iron deficiency anemia, unspecified: Secondary | ICD-10-CM | POA: Insufficient documentation

## 2013-08-18 DIAGNOSIS — I2510 Atherosclerotic heart disease of native coronary artery without angina pectoris: Secondary | ICD-10-CM | POA: Insufficient documentation

## 2013-12-04 ENCOUNTER — Emergency Department (HOSPITAL_BASED_OUTPATIENT_CLINIC_OR_DEPARTMENT_OTHER): Payer: Medicare Other

## 2013-12-04 ENCOUNTER — Emergency Department (HOSPITAL_BASED_OUTPATIENT_CLINIC_OR_DEPARTMENT_OTHER)
Admission: EM | Admit: 2013-12-04 | Discharge: 2013-12-04 | Disposition: A | Discharge status: Home / Self Care | Payer: Medicare Other | Attending: Emergency Medicine | Admitting: Emergency Medicine

## 2013-12-04 ENCOUNTER — Encounter (HOSPITAL_BASED_OUTPATIENT_CLINIC_OR_DEPARTMENT_OTHER): Payer: Self-pay | Admitting: Emergency Medicine

## 2013-12-04 DIAGNOSIS — Z87891 Personal history of nicotine dependence: Secondary | ICD-10-CM | POA: Insufficient documentation

## 2013-12-04 DIAGNOSIS — Z8669 Personal history of other diseases of the nervous system and sense organs: Secondary | ICD-10-CM | POA: Insufficient documentation

## 2013-12-04 DIAGNOSIS — N23 Unspecified renal colic: Secondary | ICD-10-CM | POA: Diagnosis not present

## 2013-12-04 DIAGNOSIS — Z880 Allergy status to penicillin: Secondary | ICD-10-CM | POA: Diagnosis not present

## 2013-12-04 DIAGNOSIS — Z8701 Personal history of pneumonia (recurrent): Secondary | ICD-10-CM | POA: Diagnosis not present

## 2013-12-04 DIAGNOSIS — Z872 Personal history of diseases of the skin and subcutaneous tissue: Secondary | ICD-10-CM | POA: Diagnosis not present

## 2013-12-04 DIAGNOSIS — E119 Type 2 diabetes mellitus without complications: Secondary | ICD-10-CM | POA: Diagnosis not present

## 2013-12-04 DIAGNOSIS — Z8739 Personal history of other diseases of the musculoskeletal system and connective tissue: Secondary | ICD-10-CM | POA: Insufficient documentation

## 2013-12-04 DIAGNOSIS — Z7982 Long term (current) use of aspirin: Secondary | ICD-10-CM | POA: Insufficient documentation

## 2013-12-04 DIAGNOSIS — I10 Essential (primary) hypertension: Secondary | ICD-10-CM | POA: Diagnosis not present

## 2013-12-04 DIAGNOSIS — R109 Unspecified abdominal pain: Secondary | ICD-10-CM | POA: Diagnosis present

## 2013-12-04 DIAGNOSIS — IMO0002 Reserved for concepts with insufficient information to code with codable children: Secondary | ICD-10-CM | POA: Insufficient documentation

## 2013-12-04 DIAGNOSIS — Z79899 Other long term (current) drug therapy: Secondary | ICD-10-CM | POA: Insufficient documentation

## 2013-12-04 HISTORY — DX: Essential (primary) hypertension: I10

## 2013-12-04 LAB — CBC WITH DIFFERENTIAL/PLATELET
BASOS ABS: 0 10*3/uL (ref 0.0–0.1)
BASOS PCT: 0 % (ref 0–1)
Eosinophils Absolute: 0.1 10*3/uL (ref 0.0–0.7)
Eosinophils Relative: 1 % (ref 0–5)
HCT: 47.9 % (ref 39.0–52.0)
Hemoglobin: 16.3 g/dL (ref 13.0–17.0)
LYMPHS PCT: 9 % — AB (ref 12–46)
Lymphs Abs: 1.1 10*3/uL (ref 0.7–4.0)
MCH: 30.5 pg (ref 26.0–34.0)
MCHC: 34 g/dL (ref 30.0–36.0)
MCV: 89.7 fL (ref 78.0–100.0)
Monocytes Absolute: 0.9 10*3/uL (ref 0.1–1.0)
Monocytes Relative: 7 % (ref 3–12)
NEUTROS ABS: 10 10*3/uL — AB (ref 1.7–7.7)
NEUTROS PCT: 83 % — AB (ref 43–77)
Platelets: 151 10*3/uL (ref 150–400)
RBC: 5.34 MIL/uL (ref 4.22–5.81)
RDW: 13.6 % (ref 11.5–15.5)
WBC: 12.1 10*3/uL — AB (ref 4.0–10.5)

## 2013-12-04 LAB — URINALYSIS, ROUTINE W REFLEX MICROSCOPIC
BILIRUBIN URINE: NEGATIVE
KETONES UR: NEGATIVE mg/dL
LEUKOCYTES UA: NEGATIVE
Nitrite: NEGATIVE
PH: 5.5 (ref 5.0–8.0)
Protein, ur: 30 mg/dL — AB
Specific Gravity, Urine: 1.029 (ref 1.005–1.030)
Urobilinogen, UA: 0.2 mg/dL (ref 0.0–1.0)

## 2013-12-04 LAB — COMPREHENSIVE METABOLIC PANEL
ALBUMIN: 3.9 g/dL (ref 3.5–5.2)
ALK PHOS: 50 U/L (ref 39–117)
ALT: 62 U/L — AB (ref 0–53)
AST: 30 U/L (ref 0–37)
Anion gap: 16 — ABNORMAL HIGH (ref 5–15)
BILIRUBIN TOTAL: 0.8 mg/dL (ref 0.3–1.2)
BUN: 17 mg/dL (ref 6–23)
CHLORIDE: 99 meq/L (ref 96–112)
CO2: 26 meq/L (ref 19–32)
Calcium: 10.5 mg/dL (ref 8.4–10.5)
Creatinine, Ser: 1.3 mg/dL (ref 0.50–1.35)
GFR calc Af Amer: 60 mL/min — ABNORMAL LOW (ref >90)
GFR, EST NON AFRICAN AMERICAN: 52 mL/min — AB (ref >90)
Glucose, Bld: 199 mg/dL — ABNORMAL HIGH (ref 70–99)
POTASSIUM: 4.2 meq/L (ref 3.7–5.3)
SODIUM: 141 meq/L (ref 137–147)
Total Protein: 7.4 g/dL (ref 6.0–8.3)

## 2013-12-04 LAB — URINE MICROSCOPIC-ADD ON

## 2013-12-04 IMAGING — CT CT ABD-PELV W/O CM
2 of 4 series · 16 of 46 positions shown, 18 images · non-contrast
Comparison: CT chest [DATE]

CLINICAL DATA: Right lower quadrant pain since yesterday. Allergy
to contrast material.

EXAM:
CT ABDOMEN AND PELVIS WITHOUT CONTRAST
TECHNIQUE: Multidetector CT imaging of the abdomen and pelvis was performed
following the standard protocol without IV contrast.

[Series 2: abd/pelvis 5.0 b31f · axial · 0.84mm/px · z∈[+923,+1423]mm · 13 of 111 slices shown, 15 images]
[im 6/111  soft-tissue]
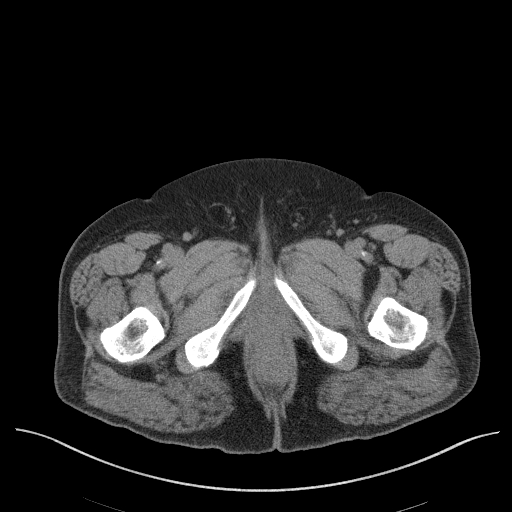
[im 6/111  bone]
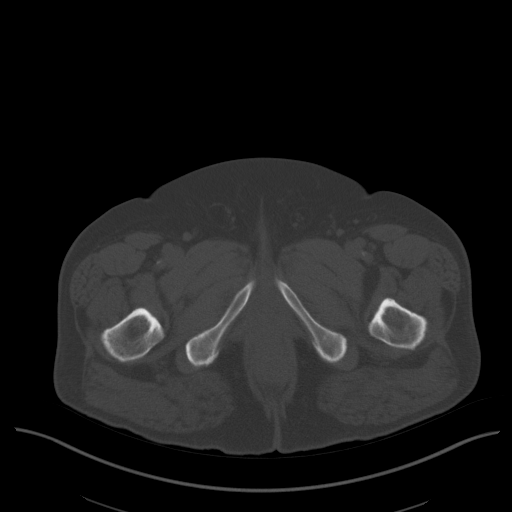
[im 16/111  soft-tissue]
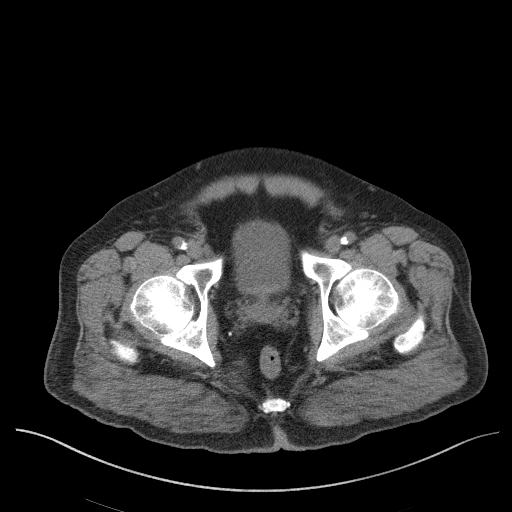
[im 26/111  soft-tissue]
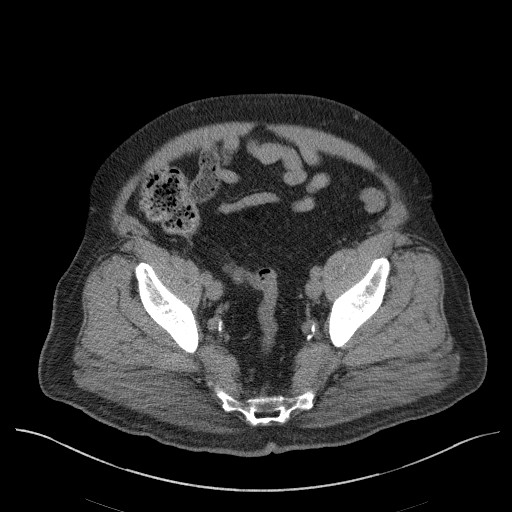
[im 31/111  soft-tissue]
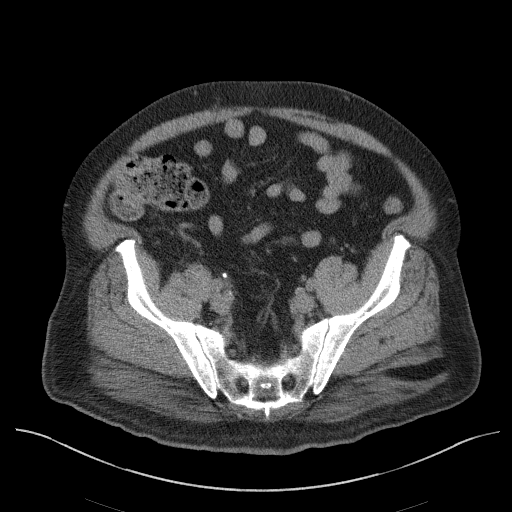
[im 41/111  soft-tissue]
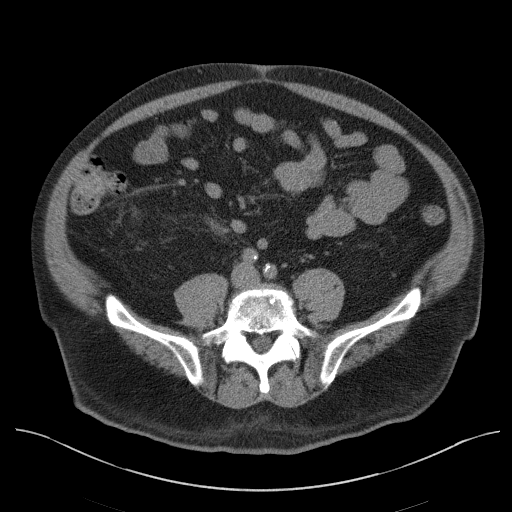
[im 46/111  soft-tissue]
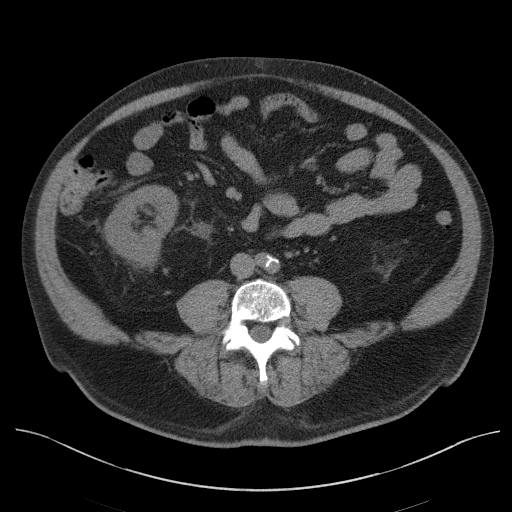
[im 56/111  soft-tissue]
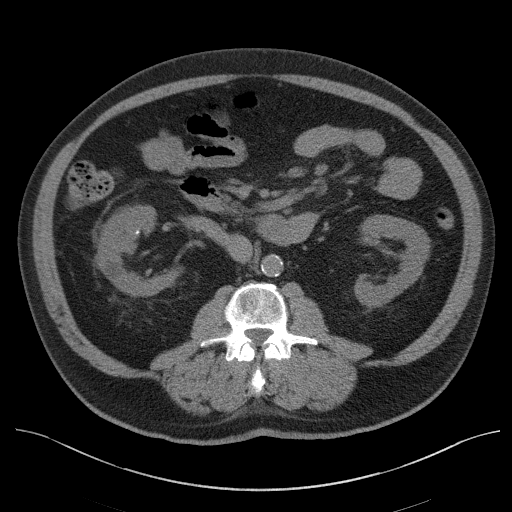
[im 66/111  soft-tissue]
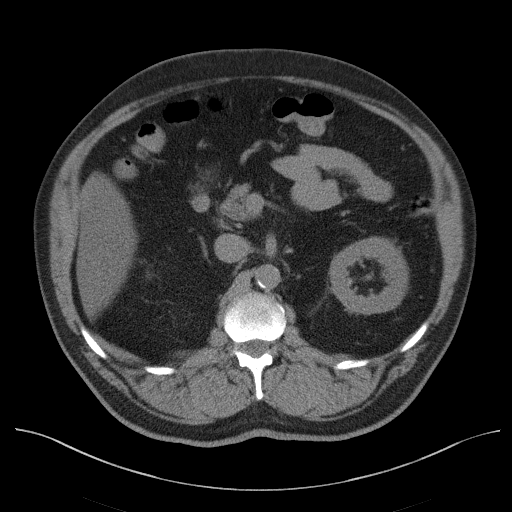
[im 71/111  soft-tissue]
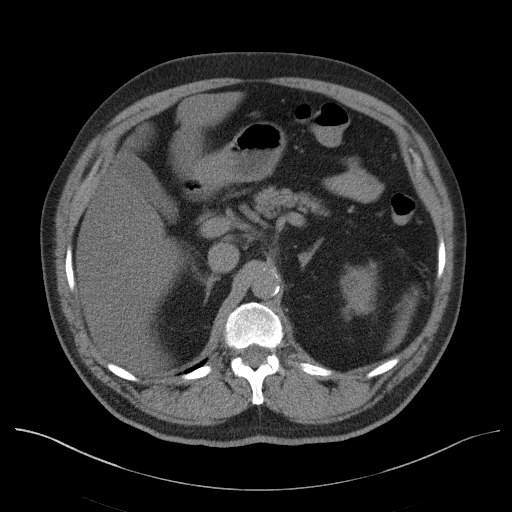
[im 71/111  bone]
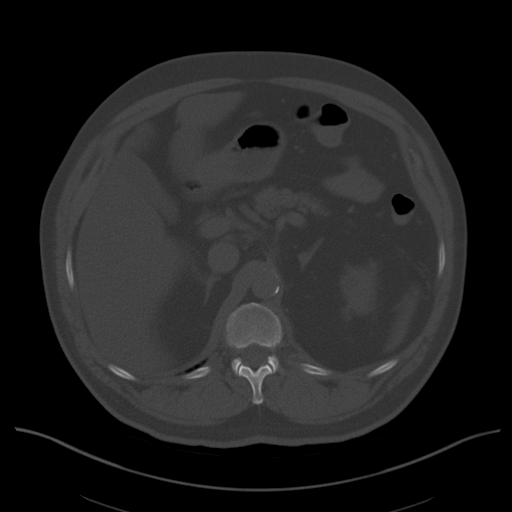
[im 81/111  soft-tissue]
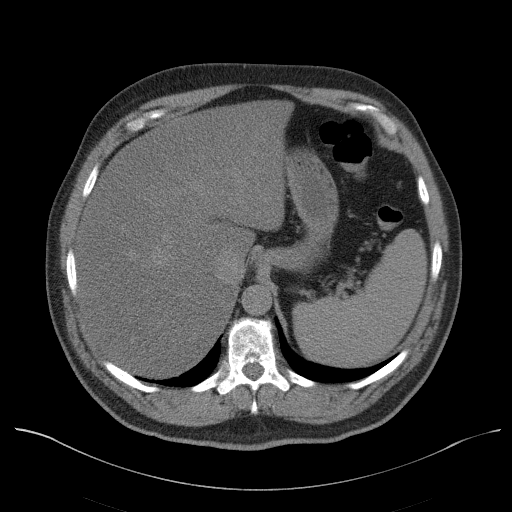
[im 86/111  soft-tissue]
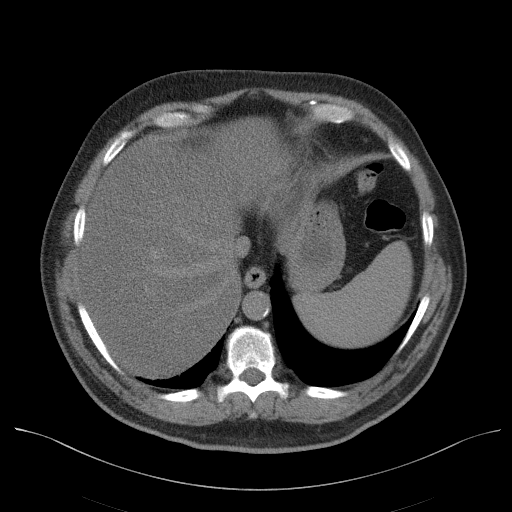
[im 96/111  soft-tissue]
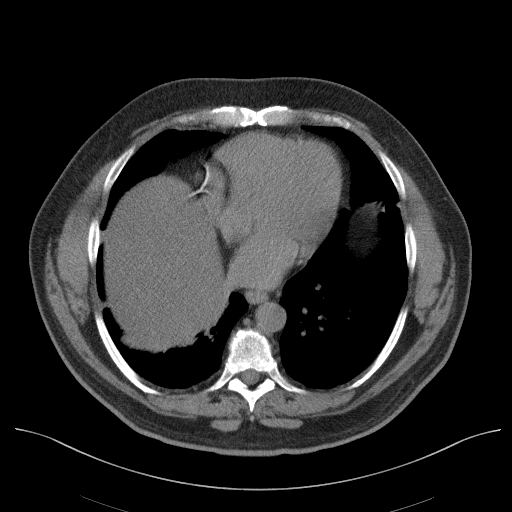
[im 106/111  soft-tissue]
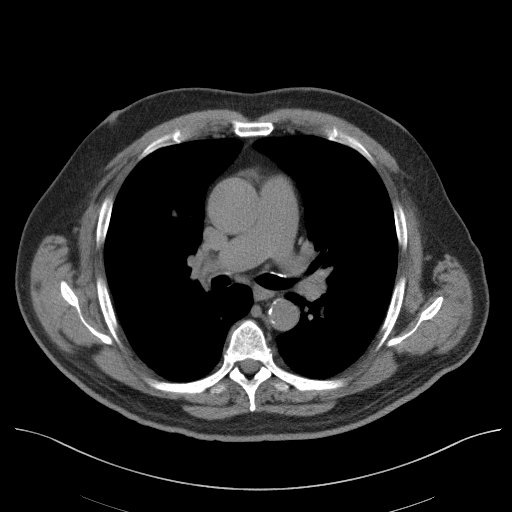

[Series 5: abd/pelvis 3.0 coronal · coronal · 0.96mm/px · 3 of 106 slices shown]
[im 36/106  soft-tissue]
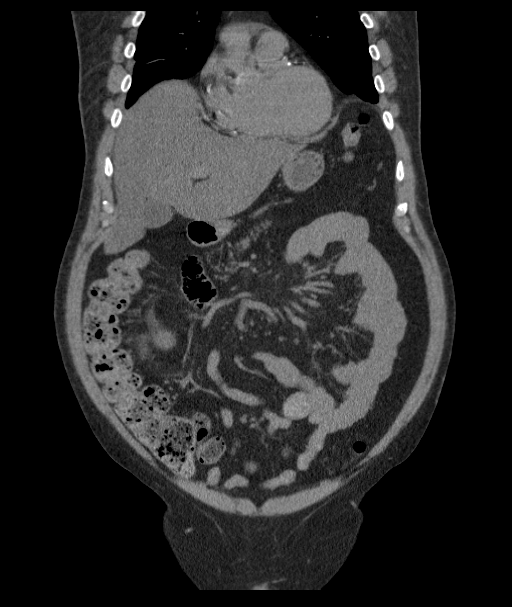
[im 47/106  soft-tissue]
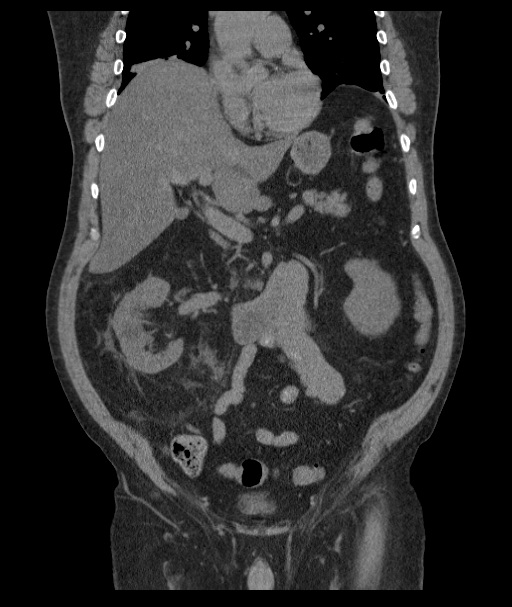
[im 59/106  soft-tissue]
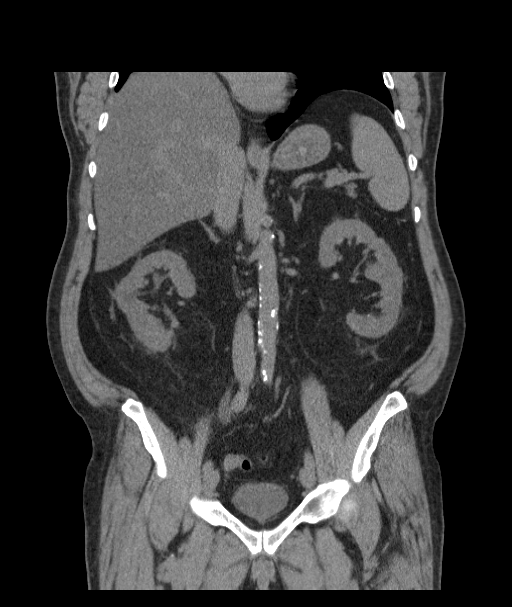

[16 of 46 positions shown; findings below may reference images not displayed]

FINDINGS: Patchy atelectasis or infiltration demonstrated in the lung bases.
Coronary artery calcifications.

Diffuse fatty infiltration of the liver. The gallbladder, pancreas,
spleen, adrenal glands, inferior vena cava, and retroperitoneal
lymph nodes are unremarkable. Calcification of the abdominal aorta
without aneurysm. Multiple stones demonstrated within both kidneys.
There is a 3 mm stone in the distal right ureter at the level of the
sacrum with proximal ureterectasis and mild pyelocaliectasis.
Perirenal and periureteral stranding. Distal ureter is decompressed.
No ureteral stones on the left. No bladder stone or bladder wall
thickening. The stomach, small bowel, and colon are decompressed. No
free air or free fluid in the abdomen. Mesenteric calcifications of
nonspecific etiology but appear benign.

Pelvis: Prostate gland is enlarged at 5 x 4.5 cm. No free or
loculated pelvic fluid collections. The appendix is normal.
Scattered diverticula in the sigmoid colon without evidence of
diverticulitis. Mild degenerative changes in the spine. No
destructive bone lesions.
IMPRESSION: 3 mm stone in the mid/distal right ureter with moderate proximal
obstruction. Multiple bilateral intrarenal stones also demonstrated.
Diffuse fatty infiltration of the liver. Normal appendix.

## 2013-12-04 MED ORDER — KETOROLAC TROMETHAMINE 30 MG/ML IJ SOLN
30.0000 mg | Freq: Once | INTRAMUSCULAR | Status: AC
  Administered 2013-12-04: 30 mg via INTRAVENOUS

## 2013-12-04 MED ORDER — FENTANYL CITRATE 0.05 MG/ML IJ SOLN
100.0000 ug | Freq: Once | INTRAMUSCULAR | Status: AC
  Administered 2013-12-04: 100 ug via INTRAVENOUS

## 2013-12-04 MED ORDER — TAMSULOSIN HCL 0.4 MG PO CAPS
0.4000 mg | ORAL_CAPSULE | Freq: Once | ORAL | Status: AC
  Administered 2013-12-04: 0.4 mg via ORAL
  Filled 2013-12-04: qty 1

## 2013-12-04 MED ORDER — FENTANYL CITRATE 0.05 MG/ML IJ SOLN
INTRAMUSCULAR | Status: AC
  Administered 2013-12-04: 100 ug via INTRAVENOUS
  Filled 2013-12-04: qty 2

## 2013-12-04 MED ORDER — KETOROLAC TROMETHAMINE 30 MG/ML IJ SOLN
INTRAMUSCULAR | Status: AC
  Administered 2013-12-04: 30 mg via INTRAVENOUS
  Filled 2013-12-04: qty 1

## 2013-12-04 MED ORDER — TAMSULOSIN HCL 0.4 MG PO CAPS: ORAL_CAPSULE | ORAL | Status: DC

## 2013-12-04 MED ORDER — ONDANSETRON HCL 4 MG/2ML IJ SOLN
INTRAMUSCULAR | Status: AC
  Filled 2013-12-04: qty 2

## 2013-12-04 MED ORDER — ONDANSETRON HCL 4 MG/2ML IJ SOLN
4.0000 mg | Freq: Once | INTRAMUSCULAR | Status: AC
  Administered 2013-12-04: 4 mg via INTRAVENOUS

## 2013-12-04 MED ORDER — HYDROCODONE-ACETAMINOPHEN 10-325 MG PO TABS: 1.0000 | ORAL_TABLET | ORAL | Status: DC | PRN

## 2013-12-04 NOTE — ED Notes (Signed)
Patient transported to CT 

## 2013-12-04 NOTE — ED Notes (Signed)
abd pain onset yesterday

## 2013-12-04 NOTE — Discharge Instructions (Signed)

## 2013-12-04 NOTE — ED Provider Notes (Signed)
CSN: [id number]     Arrival date & time 12/04/13  0108 History   First MD Initiated Contact with Patient 12/04/13 [phone]     Chief Complaint  Patient presents with  . Abdominal Pain     (Consider location/radiation/quality/duration/timing/severity/associated sxs/prior Treatment) HPI This is a 77 year old male with a two-day history of right lower quadrant abdominal pain. The pain comes and goes and is colicky. It is described as sharp and severe at its worst. It is not worse with palpation or movement. It occasionally radiates to his right testicle. He has had associated nausea but no vomiting. He has had no diarrhea but has felt constipated and bloated. He has taken a laxative and enema without relief.  Past Medical History  Diagnosis Date  . Pneumonia, organism unspecified   . Cellulitis and abscess of leg, except foot   . Obstructive sleep apnea (adult) (pediatric)   . Pain in joint, pelvic region and thigh   . Thoracic or lumbosacral neuritis or radiculitis, unspecified   . Unspecified hereditary and idiopathic peripheral neuropathy   . Unspecified disease of pericardium   . Type II or unspecified type diabetes mellitus without mention of complication, not stated as uncontrolled   . Restless legs syndrome (RLS)   . RLS (restless legs syndrome) 09/01/2012  . Hypertension    Past Surgical History  Procedure Laterality Date  . Pericarditis  2009   Family History  Problem Relation Age of Onset  . Diabetes Father    History  Substance Use Topics  . Smoking status: Former Smoker    Quit date: 09/01/1980  . Smokeless tobacco: Not on file  . Alcohol Use: No    Review of Systems  All other systems reviewed and are negative.  Allergies  Iohexol; Morphine sulfate; Oxycodone hcl; Penicillins; and Quetiapine  Home Medications   Prior to Admission medications   Medication Sig Start Date End Date Taking? Authorizing Provider  amLODipine (NORVASC) 5 MG tablet  06/30/12    Historical Provider, MD  aspirin EC 325 MG tablet Take 325 mg by mouth daily. 1-2 daily prn    Historical Provider, MD  BYSTOLIC 5 MG tablet  4/48/18   Historical Provider, MD  co-enzyme Q-10 50 MG capsule Take 50 mg by mouth daily.    Historical Provider, MD  DIOVAN 320 MG tablet  06/01/12   Historical Provider, MD  Gabapentin Enacarbil (HORIZANT) 600 MG TB24 Take 600 mg by mouth 2 (two) times daily. 09/01/12   Star Age, MD  HYDROcodone-acetaminophen (NORCO/VICODIN) 5-325 MG per tablet Take 1-2 tablets by mouth daily.  06/30/12   Historical Provider, MD  LANTUS SOLOSTAR 100 UNIT/ML SOPN  08/22/12   Historical Provider, MD  metFORMIN (GLUCOPHAGE-XR) 500 MG 24 hr tablet  07/06/12   Historical Provider, MD  multivitamin (METANX) 3-35-2 MG TABS tablet Take 1 tablet by mouth 2 (two) times daily. 09/01/12   Star Age, MD  NOVOFINE 32G X 6 MM MISC  08/19/12   Historical Provider, MD  omega-3 acid ethyl esters (LOVAZA) 1 G capsule Take 2 g by mouth 2 (two) times daily. 4 Tabs qd    Historical Provider, MD  ONE TOUCH ULTRA TEST test strip  07/02/12   Historical Provider, MD  Jonetta Speak LANCETS 56D [place]  07/01/12   Historical Provider, MD  rotigotine (NEUPRO) 6 MG/24HR Place 1 patch onto the skin daily. 09/01/12   Star Age, MD  STENDRA 200 MG TABS as needed. 09/23/12   Historical Provider, MD  tamsulosin (FLOMAX) 0.4 MG CAPS  08/10/12   Historical Provider, MD  triamcinolone lotion (KENALOG) 0.1 %  06/01/12   Historical Provider, MD   BP 154/85  Pulse 81  Temp(Src) 98.2 F (36.8 C) (Oral)  Resp 18  Ht 5' 9.5" (1.765 m)  Wt 215 lb (97.523 kg)  BMI 31.31 kg/m2  SpO2 95%  Physical Exam General: Well-developed, well-nourished male in no acute distress; appearance consistent with age of record HENT: normocephalic; atraumatic Eyes: pupils equal, round and reactive to light; extraocular muscles intact Neck: supple Heart: regular rate and rhythm Lungs: clear to auscultation bilaterally Abdomen:  soft; nondistended; nontender; no masses or hepatosplenomegaly; bowel sounds present GU: no CVA tenderness Extremities: No deformity; full range of motion; pulses normal Neurologic: Awake, alert and oriented; motor function intact in all extremities and symmetric; no facial droop Skin: Warm and dry Psychiatric: Flat affect    ED Course  Procedures (including critical care time)  MDM   Nursing notes and vitals signs, including pulse oximetry, reviewed.  Summary of this visit's results, reviewed by myself:  Labs:  Results for orders placed during the hospital encounter of 12/04/13 (from the past 24 hour(s))  URINALYSIS, ROUTINE W REFLEX MICROSCOPIC     Status: Abnormal   Collection Time    12/04/13  2:10 AM      Result Value Ref Range   Color, Urine YELLOW  YELLOW   APPearance CLEAR  CLEAR   Specific Gravity, Urine 1.029  1.005 - 1.030   pH 5.5  5.0 - 8.0   Glucose, UA >1000 (*) NEGATIVE mg/dL   Hgb urine dipstick SMALL (*) NEGATIVE   Bilirubin Urine NEGATIVE  NEGATIVE   Ketones, ur NEGATIVE  NEGATIVE mg/dL   Protein, ur 30 (*) NEGATIVE mg/dL   Urobilinogen, UA 0.2  0.0 - 1.0 mg/dL   Nitrite NEGATIVE  NEGATIVE   Leukocytes, UA NEGATIVE  NEGATIVE  URINE MICROSCOPIC-ADD ON     Status: None   Collection Time    12/04/13  2:10 AM      Result Value Ref Range   Squamous Epithelial / LPF RARE  RARE   WBC, UA 0-2  <3 WBC/hpf   RBC / HPF 3-6  <3 RBC/hpf   Bacteria, UA RARE  RARE  CBC WITH DIFFERENTIAL     Status: Abnormal   Collection Time    12/04/13  2:30 AM      Result Value Ref Range   WBC 12.1 (*) 4.0 - 10.5 K/uL   RBC 5.34  4.22 - 5.81 MIL/uL   Hemoglobin 16.3  13.0 - 17.0 g/dL   HCT 47.9  39.0 - 52.0 %   MCV 89.7  78.0 - 100.0 fL   MCH 30.5  26.0 - 34.0 pg   MCHC 34.0  30.0 - 36.0 g/dL   RDW 13.6  11.5 - 15.5 %   Platelets 151  150 - 400 K/uL   Neutrophils Relative % 83 (*) 43 - 77 %   Neutro Abs 10.0 (*) 1.7 - 7.7 K/uL   Lymphocytes Relative 9 (*) 12 - 46 %    Lymphs Abs 1.1  0.7 - 4.0 K/uL   Monocytes Relative 7  3 - 12 %   Monocytes Absolute 0.9  0.1 - 1.0 K/uL   Eosinophils Relative 1  0 - 5 %   Eosinophils Absolute 0.1  0.0 - 0.7 K/uL   Basophils Relative 0  0 - 1 %   Basophils Absolute 0.0  0.0 - 0.1 K/uL  COMPREHENSIVE METABOLIC PANEL     Status: Abnormal   Collection Time    12/04/13  2:30 AM      Result Value Ref Range   Sodium 141  137 - 147 mEq/L   Potassium 4.2  3.7 - 5.3 mEq/L   Chloride 99  96 - 112 mEq/L   CO2 26  19 - 32 mEq/L   Glucose, Bld 199 (*) 70 - 99 mg/dL   BUN 17  6 - 23 mg/dL   Creatinine, Ser 1.30  0.50 - 1.35 mg/dL   Calcium 10.5  8.4 - 10.5 mg/dL   Total Protein 7.4  6.0 - 8.3 g/dL   Albumin 3.9  3.5 - 5.2 g/dL   AST 30  0 - 37 U/L   ALT 62 (*) 0 - 53 U/L   Alkaline Phosphatase 50  39 - 117 U/L   Total Bilirubin 0.8  0.3 - 1.2 mg/dL   GFR calc non Af Amer 52 (*) >90 mL/min   GFR calc Af Amer 60 (*) >90 mL/min   Anion gap 16 (*) 5 - 15    Imaging Studies: Ct Abdomen Pelvis Wo Contrast  12/04/2013   CLINICAL DATA:  Right lower quadrant pain since yesterday. Allergy to contrast material.  EXAM: CT ABDOMEN AND PELVIS WITHOUT CONTRAST  TECHNIQUE: Multidetector CT imaging of the abdomen and pelvis was performed following the standard protocol without IV contrast.  COMPARISON:  CT chest 06/08/2013  FINDINGS: Patchy atelectasis or infiltration demonstrated in the lung bases. Coronary artery calcifications.  Diffuse fatty infiltration of the liver. The gallbladder, pancreas, spleen, adrenal glands, inferior vena cava, and retroperitoneal lymph nodes are unremarkable. Calcification of the abdominal aorta without aneurysm. Multiple stones demonstrated within both kidneys. There is a 3 mm stone in the distal right ureter at the level of the sacrum with proximal ureterectasis and mild pyelocaliectasis. Perirenal and periureteral stranding. Distal ureter is decompressed. No ureteral stones on the left. No bladder stone or  bladder wall thickening. The stomach, small bowel, and colon are decompressed. No free air or free fluid in the abdomen. Mesenteric calcifications of nonspecific etiology but appear benign.  Pelvis: Prostate gland is enlarged at 5 x 4.5 cm. No free or loculated pelvic fluid collections. The appendix is normal. Scattered diverticula in the sigmoid colon without evidence of diverticulitis. Mild degenerative changes in the spine. No destructive bone lesions.  IMPRESSION: 3 mm stone in the mid/distal right ureter with moderate proximal obstruction. Multiple bilateral intrarenal stones also demonstrated. Diffuse fatty infiltration of the liver. Normal appendix.   Electronically Signed   By: Lucienne Capers M.D.   On: 12/04/2013 04:19   4:39 AM Pain continues to be well-controlled after IV medications.     Wynetta Fines, MD 12/04/13 [phone]

## 2013-12-05 ENCOUNTER — Encounter (HOSPITAL_BASED_OUTPATIENT_CLINIC_OR_DEPARTMENT_OTHER): Payer: Self-pay | Admitting: Emergency Medicine

## 2013-12-05 ENCOUNTER — Emergency Department (HOSPITAL_BASED_OUTPATIENT_CLINIC_OR_DEPARTMENT_OTHER)
Admission: EM | Admit: 2013-12-05 | Discharge: 2013-12-05 | Disposition: A | Discharge status: Home / Self Care | Payer: Medicare Other | Attending: Emergency Medicine | Admitting: Emergency Medicine

## 2013-12-05 DIAGNOSIS — R109 Unspecified abdominal pain: Secondary | ICD-10-CM | POA: Insufficient documentation

## 2013-12-05 DIAGNOSIS — E119 Type 2 diabetes mellitus without complications: Secondary | ICD-10-CM | POA: Insufficient documentation

## 2013-12-05 DIAGNOSIS — Z880 Allergy status to penicillin: Secondary | ICD-10-CM | POA: Diagnosis not present

## 2013-12-05 DIAGNOSIS — N509 Disorder of male genital organs, unspecified: Secondary | ICD-10-CM | POA: Diagnosis not present

## 2013-12-05 DIAGNOSIS — G2581 Restless legs syndrome: Secondary | ICD-10-CM | POA: Insufficient documentation

## 2013-12-05 DIAGNOSIS — G609 Hereditary and idiopathic neuropathy, unspecified: Secondary | ICD-10-CM | POA: Insufficient documentation

## 2013-12-05 DIAGNOSIS — Z872 Personal history of diseases of the skin and subcutaneous tissue: Secondary | ICD-10-CM | POA: Insufficient documentation

## 2013-12-05 DIAGNOSIS — Z7982 Long term (current) use of aspirin: Secondary | ICD-10-CM | POA: Insufficient documentation

## 2013-12-05 DIAGNOSIS — N23 Unspecified renal colic: Secondary | ICD-10-CM | POA: Insufficient documentation

## 2013-12-05 DIAGNOSIS — Z8701 Personal history of pneumonia (recurrent): Secondary | ICD-10-CM | POA: Insufficient documentation

## 2013-12-05 DIAGNOSIS — I10 Essential (primary) hypertension: Secondary | ICD-10-CM | POA: Diagnosis not present

## 2013-12-05 DIAGNOSIS — Z79899 Other long term (current) drug therapy: Secondary | ICD-10-CM | POA: Diagnosis not present

## 2013-12-05 DIAGNOSIS — Z87891 Personal history of nicotine dependence: Secondary | ICD-10-CM | POA: Insufficient documentation

## 2013-12-05 MED ORDER — KETOROLAC TROMETHAMINE 10 MG PO TABS: 10.0000 mg | ORAL_TABLET | Freq: Four times a day (QID) | ORAL | Status: DC | PRN

## 2013-12-05 MED ORDER — ONDANSETRON HCL 4 MG/2ML IJ SOLN
INTRAMUSCULAR | Status: AC
  Administered 2013-12-05: 4 mg
  Filled 2013-12-05: qty 2

## 2013-12-05 MED ORDER — KETOROLAC TROMETHAMINE 60 MG/2ML IM SOLN
60.0000 mg | Freq: Once | INTRAMUSCULAR | Status: AC
  Administered 2013-12-05: 60 mg via INTRAMUSCULAR
  Filled 2013-12-05: qty 2

## 2013-12-05 NOTE — ED Notes (Signed)
Right flank pain. Vicodin with no relief. Obvious pain and discomfort.

## 2013-12-05 NOTE — Discharge Instructions (Signed)

## 2013-12-05 NOTE — ED Provider Notes (Signed)
CSN: [id number]     Arrival date & time 12/05/13  1505 History  This chart was scribed for Evelina Bucy, MD, by Neta Ehlers, ED Scribe. This patient was seen in room MH07/MH07 and the patient's care was started at 3:43 PM.   First MD Initiated Contact with Patient 12/05/13 1532     Chief Complaint  Patient presents with  . Nephrolithiasis    Patient is a 78 y.o. male presenting with flank pain. The history is provided by the patient. No language interpreter was used.  Flank Pain This is a recurrent problem. The current episode started 1 to 2 hours ago. The problem occurs constantly. The problem has been gradually worsening. Pertinent negatives include no chest pain and no headaches. Nothing aggravates the symptoms. The symptoms are relieved by narcotics. Treatments tried: Hydrocodone  The treatment provided no relief.   HPI Comments: ELBER GALYEAN is a 78 y.o. male, with a h/o HTN and DM, who presents to the Emergency Department complaining of right-sided flank pain which he rates as 9/10 and worsened approximately two hours ago. Mr. Mesa treated the pain PTA with hydrocodone and Vicodin without relief. He endorses mild radiation of the pain to his testicles. He denies fever, nausea, emesis, or diarrhea; in the ED his temperatures is 98.2 F. The pt was treated for similar symptoms in the ED yesterday; a CT of the abdomen revealed a 3 mm calculus in the mid/distal right ureter with moderate proximal obstruction; the pt was prescribed Flomax. Mr. Mccaster has an appointment with a urologist tomorrow.    Past Medical History  Diagnosis Date  . Pneumonia, organism unspecified   . Cellulitis and abscess of leg, except foot   . Obstructive sleep apnea (adult) (pediatric)   . Pain in joint, pelvic region and thigh   . Thoracic or lumbosacral neuritis or radiculitis, unspecified   . Unspecified hereditary and idiopathic peripheral neuropathy   . Unspecified disease of pericardium   . Type II or  unspecified type diabetes mellitus without mention of complication, not stated as uncontrolled   . Restless legs syndrome (RLS)   . RLS (restless legs syndrome) 09/01/2012  . Hypertension    Past Surgical History  Procedure Laterality Date  . Pericarditis  2009   Family History  Problem Relation Age of Onset  . Diabetes Father    History  Substance Use Topics  . Smoking status: Former Smoker    Quit date: 09/01/1980  . Smokeless tobacco: Not on file  . Alcohol Use: No    Review of Systems  Constitutional: Negative for fever.  Cardiovascular: Negative for chest pain.  Gastrointestinal: Negative for nausea, vomiting and diarrhea.  Genitourinary: Positive for flank pain and testicular pain.  Neurological: Negative for headaches.  All other systems reviewed and are negative.   Allergies  Iohexol; Morphine sulfate; Oxycodone hcl; Penicillins; and Quetiapine  Home Medications   Prior to Admission medications   Medication Sig Start Date End Date Taking? Authorizing Provider  amLODipine (NORVASC) 5 MG tablet  06/30/12   Historical Provider, MD  aspirin EC 325 MG tablet Take 325 mg by mouth daily. 1-2 daily prn    Historical Provider, MD  BYSTOLIC 5 MG tablet  7/42/59   Historical Provider, MD  co-enzyme Q-10 50 MG capsule Take 50 mg by mouth daily.    Historical Provider, MD  DIOVAN 320 MG tablet  06/01/12   Historical Provider, MD  Gabapentin Enacarbil (HORIZANT) 600 MG TB24 Take 600 mg by  mouth 2 (two) times daily. 09/01/12   Star Age, MD  HYDROcodone-acetaminophen (NORCO) 10-325 MG per tablet Take 1 tablet by mouth every 4 (four) hours as needed (for pain). 12/04/13   Karen Chafe Molpus, MD  HYDROcodone-acetaminophen (NORCO/VICODIN) 5-325 MG per tablet Take 1-2 tablets by mouth daily.  06/30/12   Historical Provider, MD  LANTUS SOLOSTAR 100 UNIT/ML SOPN  08/22/12   Historical Provider, MD  metFORMIN (GLUCOPHAGE-XR) 500 MG 24 hr tablet  07/06/12   Historical Provider, MD  multivitamin  (METANX) 3-35-2 MG TABS tablet Take 1 tablet by mouth 2 (two) times daily. 09/01/12   Star Age, MD  NOVOFINE 32G X 6 MM MISC  08/19/12   Historical Provider, MD  omega-3 acid ethyl esters (LOVAZA) 1 G capsule Take 2 g by mouth 2 (two) times daily. 4 Tabs qd    Historical Provider, MD  ONE TOUCH ULTRA TEST test strip  07/02/12   Historical Provider, MD  Jonetta Speak LANCETS 62I [place]  07/01/12   Historical Provider, MD  rotigotine (NEUPRO) 6 MG/24HR Place 1 patch onto the skin daily. 09/01/12   Star Age, MD  STENDRA 200 MG TABS as needed. 09/23/12   Historical Provider, MD  tamsulosin (FLOMAX) 0.4 MG CAPS capsule Take one capsule daily until stone passes. 12/04/13   Karen Chafe Molpus, MD  triamcinolone lotion (KENALOG) 0.1 %  06/01/12   Historical Provider, MD   Triage Vitals: BP 182/90  Pulse 91  Temp(Src) 98.2 F (36.8 C)  Resp 20  Ht 5\' 10"  (1.778 m)  Wt 215 lb (97.523 kg)  BMI 30.85 kg/m2  SpO2 97%  Physical Exam  Nursing note and vitals reviewed. Constitutional: He appears well-developed and well-nourished. No distress.  HENT:  Head: Normocephalic and atraumatic.  Mouth/Throat: Oropharynx is clear and moist. No oropharyngeal exudate.  Eyes: Conjunctivae and EOM are normal. Pupils are equal, round, and reactive to light. Right eye exhibits no discharge. Left eye exhibits no discharge. No scleral icterus.  Neck: Normal range of motion. Neck supple. No JVD present. No thyromegaly present.  Cardiovascular: Normal rate, regular rhythm, normal heart sounds and intact distal pulses.  Exam reveals no gallop and no friction rub.   No murmur heard. Pulmonary/Chest: Effort normal and breath sounds normal. No respiratory distress. He has no wheezes. He has no rales.  Abdominal: Soft. Bowel sounds are normal. He exhibits no distension and no mass. There is tenderness (R flank, lower).  Musculoskeletal: Normal range of motion. He exhibits no edema and no tenderness.  Lymphadenopathy:    He has no  cervical adenopathy.  Neurological: He is alert. Coordination normal.  Skin: Skin is warm and dry. No rash noted. No erythema.  Psychiatric: He has a normal mood and affect. His behavior is normal.    ED Course  Procedures (including critical care time)  DIAGNOSTIC STUDIES: Oxygen Saturation is 97% on room air, normal by my interpretation.    COORDINATION OF CARE:  3:51 PM- Discussed treatment plan with patient, and the patient agreed to the plan. The plan includes Toradol IM. Will also provide a prescription of Toradol.   Labs Review Labs Reviewed - No data to display  Imaging Review Ct Abdomen Pelvis Wo Contrast  12/04/2013   CLINICAL DATA:  Right lower quadrant pain since yesterday. Allergy to contrast material.  EXAM: CT ABDOMEN AND PELVIS WITHOUT CONTRAST  TECHNIQUE: Multidetector CT imaging of the abdomen and pelvis was performed following the standard protocol without IV contrast.  COMPARISON:  CT chest 06/08/2013  FINDINGS: Patchy atelectasis or infiltration demonstrated in the lung bases. Coronary artery calcifications.  Diffuse fatty infiltration of the liver. The gallbladder, pancreas, spleen, adrenal glands, inferior vena cava, and retroperitoneal lymph nodes are unremarkable. Calcification of the abdominal aorta without aneurysm. Multiple stones demonstrated within both kidneys. There is a 3 mm stone in the distal right ureter at the level of the sacrum with proximal ureterectasis and mild pyelocaliectasis. Perirenal and periureteral stranding. Distal ureter is decompressed. No ureteral stones on the left. No bladder stone or bladder wall thickening. The stomach, small bowel, and colon are decompressed. No free air or free fluid in the abdomen. Mesenteric calcifications of nonspecific etiology but appear benign.  Pelvis: Prostate gland is enlarged at 5 x 4.5 cm. No free or loculated pelvic fluid collections. The appendix is normal. Scattered diverticula in the sigmoid colon without  evidence of diverticulitis. Mild degenerative changes in the spine. No destructive bone lesions.  IMPRESSION: 3 mm stone in the mid/distal right ureter with moderate proximal obstruction. Multiple bilateral intrarenal stones also demonstrated. Diffuse fatty infiltration of the liver. Normal appendix.   Electronically Signed   By: Lucienne Capers M.D.   On: 12/04/2013 04:19     EKG Interpretation None      MDM   Final diagnoses:  Ureteral colic    78 year old male here with right flank pain. Seen last night given Toradol for a 3 mm kidney stone with great relief. At home today was unable to get comfortable with Vicodin so he returned. No fevers, nausea, vomiting. Pain is the same amount as when he came in last night, and a worsening. Will give Toradol again here. Feeling much better with Toradol, given toradol Rx. Stable for discharge.  I personally performed the services described in this documentation, which was scribed in my presence. The recorded information has been reviewed and is accurate.     Evelina Bucy, MD 12/05/13 [phone]

## 2014-02-03 DIAGNOSIS — E785 Hyperlipidemia, unspecified: Secondary | ICD-10-CM | POA: Insufficient documentation

## 2015-03-13 ENCOUNTER — Encounter: Payer: Self-pay | Admitting: Gastroenterology

## 2015-03-22 DIAGNOSIS — F410 Panic disorder [episodic paroxysmal anxiety] without agoraphobia: Secondary | ICD-10-CM | POA: Insufficient documentation

## 2015-05-02 DIAGNOSIS — R5382 Chronic fatigue, unspecified: Secondary | ICD-10-CM | POA: Insufficient documentation

## 2015-05-02 DIAGNOSIS — R5383 Other fatigue: Secondary | ICD-10-CM

## 2015-05-02 DIAGNOSIS — R5381 Other malaise: Secondary | ICD-10-CM | POA: Insufficient documentation

## 2015-08-31 DIAGNOSIS — E1142 Type 2 diabetes mellitus with diabetic polyneuropathy: Secondary | ICD-10-CM | POA: Insufficient documentation

## 2015-08-31 DIAGNOSIS — R2689 Other abnormalities of gait and mobility: Secondary | ICD-10-CM | POA: Insufficient documentation

## 2015-08-31 DIAGNOSIS — F1120 Opioid dependence, uncomplicated: Secondary | ICD-10-CM | POA: Insufficient documentation

## 2015-09-06 ENCOUNTER — Encounter: Payer: Self-pay | Admitting: Podiatry

## 2015-09-06 ENCOUNTER — Ambulatory Visit (INDEPENDENT_AMBULATORY_CARE_PROVIDER_SITE_OTHER): Payer: Medicare Other | Admitting: Podiatry

## 2015-09-06 VITALS — BP 171/89 | HR 96 | Resp 18

## 2015-09-06 DIAGNOSIS — M79674 Pain in right toe(s): Secondary | ICD-10-CM | POA: Diagnosis not present

## 2015-09-06 DIAGNOSIS — M79675 Pain in left toe(s): Secondary | ICD-10-CM

## 2015-09-06 DIAGNOSIS — B351 Tinea unguium: Secondary | ICD-10-CM

## 2015-09-06 DIAGNOSIS — L84 Corns and callosities: Secondary | ICD-10-CM

## 2015-09-06 NOTE — Progress Notes (Signed)
   Subjective:    Patient ID: Jason Hartman, male    DOB: March 18, 1936, 80 y.o.   MRN: [id number]  HPI  80 year old male presents the office they for consents calluses to the ball of the left foot and to both big toes. He also states he has painful, elongated toenails and he cannot trim himself. Denies any redness advancement toenails of the calluses. He's been using over-the-counter urea cream for the calluses which helps some. No other treatment. No other complaints.  Last A1c "8 something"   Review of Systems  All other systems reviewed and are negative.      Objective:   Physical Exam General: AAO x3, NAD  Dermatological: Hyperkeratotic lesion left some metatarsal 5 in bilateral medial hallux. Upon debridement no underlying ulceration, drainage or other signs of infection. Nails are hypertrophic, dystrophic, brittle, discolored, elongated 10. No swelling redness or drainage. Tenderness nails 1-5 bilaterally.  Vascular: Dorsalis Pedis artery and Posterior Tibial artery pedal pulses are 2/4 bilateral with immedate capillary fill time. Pedal hair growth present. No varicosities and no lower extremity edema present bilateral. There is no pain with calf compression, swelling, warmth, erythema.   Neruologic: Grossly intact via light touch bilateral. Vibratory intact via tuning fork bilateral. Protective threshold with Semmes Wienstein monofilament intact to all pedal sites bilateral.   Musculoskeletal: Prominence of the metatarsal heads plantarly with atrophy of the fat pad. No pain, crepitus, or limitation noted with foot and ankle range of motion bilateral. Muscular strength 5/5 in all groups tested bilateral.  Gait: Unassisted, Nonantalgic.      Assessment & Plan:  Symptomatic onychomycosis, hyperkeratotic lesions. -Treatment options discussed including all alternatives, risks, and complications -Etiology of symptoms were discussed -Nails debrided 10 without complications or  bleeding. -Hyperkeratotic lesions debrided 3 without complications or bleeding. -Daily foot inspection -Continue urea cream over the calluses. Calces on toenails. -Follow-up in 3 months or sooner if any problems arise. In the meantime, encouraged to call the office with any questions, concerns, change in symptoms.   Celesta Gentile, DPM

## 2015-09-14 DIAGNOSIS — B351 Tinea unguium: Secondary | ICD-10-CM

## 2015-09-29 DIAGNOSIS — B351 Tinea unguium: Secondary | ICD-10-CM

## 2015-11-24 DIAGNOSIS — B351 Tinea unguium: Secondary | ICD-10-CM

## 2015-12-04 ENCOUNTER — Ambulatory Visit (INDEPENDENT_AMBULATORY_CARE_PROVIDER_SITE_OTHER): Payer: Medicare Other | Admitting: Podiatry

## 2015-12-04 ENCOUNTER — Encounter: Payer: Self-pay | Admitting: Podiatry

## 2015-12-04 DIAGNOSIS — B351 Tinea unguium: Secondary | ICD-10-CM

## 2015-12-04 DIAGNOSIS — L84 Corns and callosities: Secondary | ICD-10-CM

## 2015-12-04 DIAGNOSIS — M79674 Pain in right toe(s): Secondary | ICD-10-CM | POA: Diagnosis not present

## 2015-12-04 DIAGNOSIS — M79675 Pain in left toe(s): Secondary | ICD-10-CM | POA: Diagnosis not present

## 2015-12-05 NOTE — Progress Notes (Signed)
Subjective: 80 y.o. returns the office today for painful, elongated, thickened toenails which he cannot trim himself. Denies any redness or drainage around the nails. Denies any acute changes since last appointment and no new complaints today. Denies any systemic complaints such as fevers, chills, nausea, vomiting.   Objective: AAO 3, NAD DP/PT pulses palpable, CRT less than 3 seconds Nails hypertrophic, dystrophic, elongated, brittle, discolored 10. There is tenderness overlying the nails 1-5 bilaterally. There is no surrounding erythema or drainage along the nail sites. Hyperkeratotic lesions present left submetatarsal 5 bilateral medial hallux. No underlying ulceration, drainage or other signs of infection. No open lesions or pre-ulcerative lesions are identified. No other areas of tenderness bilateral lower extremities. No overlying edema, erythema, increased warmth. No pain with calf compression, swelling, warmth, erythema.  Assessment: Patient presents with symptomatic onychomycosis; hyperkeratotic lesions  Plan: -Treatment options including alternatives, risks, complications were discussed -Nails sharply debrided 10 without complication/bleeding. -Hyperkeratotic lesions debrided 3 without couple complications or bleeding. -Discussed daily foot inspection. If there are any changes, to call the office immediately.  -Follow-up in 3 months or sooner if any problems are to arise. In the meantime, encouraged to call the office with any questions, concerns, changes symptoms.  Celesta Gentile, DPM

## 2016-03-04 ENCOUNTER — Ambulatory Visit: Payer: Medicare Other | Admitting: Podiatry

## 2016-03-08 ENCOUNTER — Ambulatory Visit (INDEPENDENT_AMBULATORY_CARE_PROVIDER_SITE_OTHER): Payer: Medicare Other | Admitting: Podiatry

## 2016-03-08 ENCOUNTER — Encounter: Payer: Self-pay | Admitting: Podiatry

## 2016-03-08 DIAGNOSIS — L84 Corns and callosities: Secondary | ICD-10-CM | POA: Diagnosis not present

## 2016-03-08 DIAGNOSIS — M79674 Pain in right toe(s): Secondary | ICD-10-CM

## 2016-03-08 DIAGNOSIS — B351 Tinea unguium: Secondary | ICD-10-CM | POA: Diagnosis not present

## 2016-03-08 DIAGNOSIS — M79675 Pain in left toe(s): Secondary | ICD-10-CM

## 2016-03-11 NOTE — Progress Notes (Signed)
Subjective: 80 y.o. returns the office today for painful, elongated, thickened toenails which he cannot trim himself. Denies any redness or drainage around the nails. Also has calluses that needs to be trimmed. Denies any acute changes since last appointment and no new complaints today. Denies any systemic complaints such as fevers, chills, nausea, vomiting.   Objective: AAO 3, NAD DP/PT pulses palpable, CRT less than 3 seconds Nails hypertrophic, dystrophic, elongated, brittle, discolored 10. There is tenderness overlying the nails 1-5 bilaterally. There is no surrounding erythema or drainage along the nail sites. Hyperkeratotic lesions present left submetatarsal 5 bilateral medial hallux. No underlying ulceration, drainage or other signs of infection. No open lesions or pre-ulcerative lesions are identified. No other areas of tenderness bilateral lower extremities. No overlying edema, erythema, increased warmth. No pain with calf compression, swelling, warmth, erythema.  Assessment: Patient presents with symptomatic onychomycosis; hyperkeratotic lesions  Plan: -Treatment options including alternatives, risks, complications were discussed -Nails sharply debrided 10 without complication/bleeding. -Hyperkeratotic lesions debrided 3 without couple complications or bleeding. -Discussed daily foot inspection. If there are any changes, to call the office immediately.  -Follow-up in 3 months or sooner if any problems are to arise. In the meantime, encouraged to call the office with any questions, concerns, changes symptoms.  Celesta Gentile, DPM

## 2016-04-09 DIAGNOSIS — E291 Testicular hypofunction: Secondary | ICD-10-CM | POA: Diagnosis not present

## 2016-04-10 DIAGNOSIS — I10 Essential (primary) hypertension: Secondary | ICD-10-CM | POA: Diagnosis not present

## 2016-04-10 DIAGNOSIS — N3941 Urge incontinence: Secondary | ICD-10-CM | POA: Diagnosis not present

## 2016-04-15 DIAGNOSIS — R399 Unspecified symptoms and signs involving the genitourinary system: Secondary | ICD-10-CM | POA: Insufficient documentation

## 2016-04-15 DIAGNOSIS — R350 Frequency of micturition: Secondary | ICD-10-CM | POA: Diagnosis not present

## 2016-04-15 DIAGNOSIS — Z6829 Body mass index (BMI) 29.0-29.9, adult: Secondary | ICD-10-CM | POA: Diagnosis not present

## 2016-04-23 DIAGNOSIS — R319 Hematuria, unspecified: Secondary | ICD-10-CM | POA: Diagnosis not present

## 2016-04-23 DIAGNOSIS — E291 Testicular hypofunction: Secondary | ICD-10-CM | POA: Diagnosis not present

## 2016-04-23 DIAGNOSIS — Z6831 Body mass index (BMI) 31.0-31.9, adult: Secondary | ICD-10-CM | POA: Diagnosis not present

## 2016-04-23 DIAGNOSIS — R399 Unspecified symptoms and signs involving the genitourinary system: Secondary | ICD-10-CM | POA: Diagnosis not present

## 2016-04-30 DIAGNOSIS — F410 Panic disorder [episodic paroxysmal anxiety] without agoraphobia: Secondary | ICD-10-CM | POA: Diagnosis not present

## 2016-05-02 DIAGNOSIS — R918 Other nonspecific abnormal finding of lung field: Secondary | ICD-10-CM | POA: Diagnosis not present

## 2016-05-02 DIAGNOSIS — R0602 Shortness of breath: Secondary | ICD-10-CM | POA: Diagnosis not present

## 2016-05-02 DIAGNOSIS — J984 Other disorders of lung: Secondary | ICD-10-CM | POA: Diagnosis not present

## 2016-05-02 DIAGNOSIS — J309 Allergic rhinitis, unspecified: Secondary | ICD-10-CM | POA: Diagnosis not present

## 2016-05-02 DIAGNOSIS — R05 Cough: Secondary | ICD-10-CM | POA: Diagnosis not present

## 2016-05-02 DIAGNOSIS — G4733 Obstructive sleep apnea (adult) (pediatric): Secondary | ICD-10-CM | POA: Diagnosis not present

## 2016-05-08 DIAGNOSIS — E291 Testicular hypofunction: Secondary | ICD-10-CM | POA: Diagnosis not present

## 2016-05-22 DIAGNOSIS — E291 Testicular hypofunction: Secondary | ICD-10-CM | POA: Diagnosis not present

## 2016-05-23 DIAGNOSIS — F419 Anxiety disorder, unspecified: Secondary | ICD-10-CM | POA: Diagnosis not present

## 2016-05-23 DIAGNOSIS — G2581 Restless legs syndrome: Secondary | ICD-10-CM | POA: Diagnosis not present

## 2016-05-23 DIAGNOSIS — G20 Parkinson's disease: Secondary | ICD-10-CM | POA: Diagnosis not present

## 2016-05-29 DIAGNOSIS — H35372 Puckering of macula, left eye: Secondary | ICD-10-CM | POA: Diagnosis not present

## 2016-05-29 DIAGNOSIS — H3343 Traction detachment of retina, bilateral: Secondary | ICD-10-CM | POA: Diagnosis not present

## 2016-06-05 DIAGNOSIS — E291 Testicular hypofunction: Secondary | ICD-10-CM | POA: Diagnosis not present

## 2016-06-07 ENCOUNTER — Ambulatory Visit: Payer: Medicare Other | Admitting: Podiatry

## 2016-06-13 DIAGNOSIS — Z85828 Personal history of other malignant neoplasm of skin: Secondary | ICD-10-CM | POA: Diagnosis not present

## 2016-06-13 DIAGNOSIS — L821 Other seborrheic keratosis: Secondary | ICD-10-CM | POA: Diagnosis not present

## 2016-06-13 DIAGNOSIS — L570 Actinic keratosis: Secondary | ICD-10-CM | POA: Diagnosis not present

## 2016-06-13 DIAGNOSIS — D1801 Hemangioma of skin and subcutaneous tissue: Secondary | ICD-10-CM | POA: Diagnosis not present

## 2016-06-13 DIAGNOSIS — C44319 Basal cell carcinoma of skin of other parts of face: Secondary | ICD-10-CM | POA: Diagnosis not present

## 2016-06-13 DIAGNOSIS — C44119 Basal cell carcinoma of skin of left eyelid, including canthus: Secondary | ICD-10-CM | POA: Diagnosis not present

## 2016-06-13 DIAGNOSIS — D225 Melanocytic nevi of trunk: Secondary | ICD-10-CM | POA: Diagnosis not present

## 2016-06-14 DIAGNOSIS — Z298 Encounter for other specified prophylactic measures: Secondary | ICD-10-CM | POA: Diagnosis not present

## 2016-06-18 ENCOUNTER — Ambulatory Visit (INDEPENDENT_AMBULATORY_CARE_PROVIDER_SITE_OTHER): Payer: Medicare Other | Admitting: Podiatry

## 2016-06-18 ENCOUNTER — Encounter: Payer: Self-pay | Admitting: Podiatry

## 2016-06-18 DIAGNOSIS — B351 Tinea unguium: Secondary | ICD-10-CM

## 2016-06-18 DIAGNOSIS — L84 Corns and callosities: Secondary | ICD-10-CM

## 2016-06-18 DIAGNOSIS — M79674 Pain in right toe(s): Secondary | ICD-10-CM | POA: Diagnosis not present

## 2016-06-18 DIAGNOSIS — M79675 Pain in left toe(s): Secondary | ICD-10-CM | POA: Diagnosis not present

## 2016-06-19 NOTE — Progress Notes (Signed)
Subjective: 81 y.o. returns the office today for painful, elongated, thickened toenails which he cannot trim himself. Denies any redness or drainage around the nails. Also has calluses that needs to be trimmed. No redness or drainage. Denies any acute changes since last appointment and no new complaints today. Denies any systemic complaints such as fevers, chills, nausea, vomiting.   Objective: AAO 3, NAD DP/PT pulses palpable, CRT less than 3 seconds Nails hypertrophic, dystrophic, elongated, brittle, discolored 10. There is tenderness overlying the nails 1-5 bilaterally. There is no surrounding erythema or drainage along the nail sites. Hyperkeratotic lesions present left submetatarsal 5 bilateral medial hallux. No underlying ulceration, drainage or other signs of infection. No open lesions or pre-ulcerative lesions are identified. No other areas of tenderness bilateral lower extremities. No overlying edema, erythema, increased warmth. No pain with calf compression, swelling, warmth, erythema.  Assessment: Patient presents with symptomatic onychomycosis; hyperkeratotic lesions  Plan: -Treatment options including alternatives, risks, complications were discussed -Nails sharply debrided 10 without complication/bleeding. -Hyperkeratotic lesions debrided 3 without couple complications or bleeding. -Discussed daily foot inspection. If there are any changes, to call the office immediately.  -Follow-up in 3 months or sooner if any problems are to arise. In the meantime, encouraged to call the office with any questions, concerns, changes symptoms.  Celesta Gentile, DPM

## 2016-06-20 DIAGNOSIS — G4733 Obstructive sleep apnea (adult) (pediatric): Secondary | ICD-10-CM | POA: Diagnosis not present

## 2016-06-20 DIAGNOSIS — J984 Other disorders of lung: Secondary | ICD-10-CM | POA: Diagnosis not present

## 2016-06-20 DIAGNOSIS — E291 Testicular hypofunction: Secondary | ICD-10-CM | POA: Diagnosis not present

## 2016-06-20 DIAGNOSIS — R062 Wheezing: Secondary | ICD-10-CM | POA: Diagnosis not present

## 2016-06-20 DIAGNOSIS — R319 Hematuria, unspecified: Secondary | ICD-10-CM | POA: Diagnosis not present

## 2016-06-24 DIAGNOSIS — Z6833 Body mass index (BMI) 33.0-33.9, adult: Secondary | ICD-10-CM | POA: Diagnosis not present

## 2016-06-24 DIAGNOSIS — Z880 Allergy status to penicillin: Secondary | ICD-10-CM | POA: Diagnosis not present

## 2016-06-24 DIAGNOSIS — E1169 Type 2 diabetes mellitus with other specified complication: Secondary | ICD-10-CM | POA: Diagnosis not present

## 2016-06-24 DIAGNOSIS — E1129 Type 2 diabetes mellitus with other diabetic kidney complication: Secondary | ICD-10-CM | POA: Diagnosis not present

## 2016-06-24 DIAGNOSIS — E669 Obesity, unspecified: Secondary | ICD-10-CM | POA: Diagnosis not present

## 2016-06-24 DIAGNOSIS — Z794 Long term (current) use of insulin: Secondary | ICD-10-CM | POA: Diagnosis not present

## 2016-06-24 DIAGNOSIS — Z91041 Radiographic dye allergy status: Secondary | ICD-10-CM | POA: Diagnosis not present

## 2016-06-24 DIAGNOSIS — G2581 Restless legs syndrome: Secondary | ICD-10-CM | POA: Diagnosis not present

## 2016-06-24 DIAGNOSIS — I10 Essential (primary) hypertension: Secondary | ICD-10-CM | POA: Diagnosis not present

## 2016-06-24 DIAGNOSIS — Z955 Presence of coronary angioplasty implant and graft: Secondary | ICD-10-CM | POA: Diagnosis not present

## 2016-06-24 DIAGNOSIS — E782 Mixed hyperlipidemia: Secondary | ICD-10-CM | POA: Diagnosis not present

## 2016-06-24 DIAGNOSIS — E1159 Type 2 diabetes mellitus with other circulatory complications: Secondary | ICD-10-CM | POA: Diagnosis not present

## 2016-06-24 DIAGNOSIS — E119 Type 2 diabetes mellitus without complications: Secondary | ICD-10-CM | POA: Diagnosis not present

## 2016-06-24 DIAGNOSIS — Z888 Allergy status to other drugs, medicaments and biological substances status: Secondary | ICD-10-CM | POA: Diagnosis not present

## 2016-06-24 DIAGNOSIS — R809 Proteinuria, unspecified: Secondary | ICD-10-CM | POA: Diagnosis not present

## 2016-06-24 DIAGNOSIS — G4733 Obstructive sleep apnea (adult) (pediatric): Secondary | ICD-10-CM | POA: Diagnosis not present

## 2016-06-24 DIAGNOSIS — E291 Testicular hypofunction: Secondary | ICD-10-CM | POA: Diagnosis not present

## 2016-06-24 DIAGNOSIS — Z79899 Other long term (current) drug therapy: Secondary | ICD-10-CM | POA: Diagnosis not present

## 2016-06-25 DIAGNOSIS — Z6832 Body mass index (BMI) 32.0-32.9, adult: Secondary | ICD-10-CM | POA: Diagnosis not present

## 2016-06-25 DIAGNOSIS — Z87442 Personal history of urinary calculi: Secondary | ICD-10-CM | POA: Diagnosis not present

## 2016-06-25 DIAGNOSIS — R399 Unspecified symptoms and signs involving the genitourinary system: Secondary | ICD-10-CM | POA: Diagnosis not present

## 2016-06-25 DIAGNOSIS — N200 Calculus of kidney: Secondary | ICD-10-CM | POA: Diagnosis not present

## 2016-06-26 DIAGNOSIS — I700 Atherosclerosis of aorta: Secondary | ICD-10-CM | POA: Diagnosis not present

## 2016-06-26 DIAGNOSIS — J849 Interstitial pulmonary disease, unspecified: Secondary | ICD-10-CM | POA: Diagnosis not present

## 2016-06-26 DIAGNOSIS — R918 Other nonspecific abnormal finding of lung field: Secondary | ICD-10-CM | POA: Diagnosis not present

## 2016-06-27 DIAGNOSIS — Z85828 Personal history of other malignant neoplasm of skin: Secondary | ICD-10-CM | POA: Diagnosis not present

## 2016-06-27 DIAGNOSIS — C44119 Basal cell carcinoma of skin of left eyelid, including canthus: Secondary | ICD-10-CM | POA: Diagnosis not present

## 2016-06-27 DIAGNOSIS — L570 Actinic keratosis: Secondary | ICD-10-CM | POA: Diagnosis not present

## 2016-07-04 DIAGNOSIS — E291 Testicular hypofunction: Secondary | ICD-10-CM | POA: Diagnosis not present

## 2016-07-18 DIAGNOSIS — M47812 Spondylosis without myelopathy or radiculopathy, cervical region: Secondary | ICD-10-CM | POA: Diagnosis not present

## 2016-07-19 DIAGNOSIS — E291 Testicular hypofunction: Secondary | ICD-10-CM | POA: Diagnosis not present

## 2016-07-24 DIAGNOSIS — H43822 Vitreomacular adhesion, left eye: Secondary | ICD-10-CM | POA: Diagnosis not present

## 2016-07-24 DIAGNOSIS — E119 Type 2 diabetes mellitus without complications: Secondary | ICD-10-CM | POA: Diagnosis not present

## 2016-07-24 DIAGNOSIS — H524 Presbyopia: Secondary | ICD-10-CM | POA: Diagnosis not present

## 2016-07-24 DIAGNOSIS — H25813 Combined forms of age-related cataract, bilateral: Secondary | ICD-10-CM | POA: Diagnosis not present

## 2016-08-02 DIAGNOSIS — E291 Testicular hypofunction: Secondary | ICD-10-CM | POA: Diagnosis not present

## 2016-08-09 DIAGNOSIS — E291 Testicular hypofunction: Secondary | ICD-10-CM | POA: Diagnosis not present

## 2016-08-16 DIAGNOSIS — E291 Testicular hypofunction: Secondary | ICD-10-CM | POA: Diagnosis not present

## 2016-08-21 DIAGNOSIS — Z6830 Body mass index (BMI) 30.0-30.9, adult: Secondary | ICD-10-CM | POA: Diagnosis not present

## 2016-08-21 DIAGNOSIS — R399 Unspecified symptoms and signs involving the genitourinary system: Secondary | ICD-10-CM | POA: Diagnosis not present

## 2016-08-30 DIAGNOSIS — E291 Testicular hypofunction: Secondary | ICD-10-CM | POA: Diagnosis not present

## 2016-08-30 DIAGNOSIS — G4733 Obstructive sleep apnea (adult) (pediatric): Secondary | ICD-10-CM | POA: Diagnosis not present

## 2016-08-30 DIAGNOSIS — R5382 Chronic fatigue, unspecified: Secondary | ICD-10-CM | POA: Diagnosis not present

## 2016-08-30 DIAGNOSIS — I2510 Atherosclerotic heart disease of native coronary artery without angina pectoris: Secondary | ICD-10-CM | POA: Diagnosis not present

## 2016-08-30 DIAGNOSIS — Z6831 Body mass index (BMI) 31.0-31.9, adult: Secondary | ICD-10-CM | POA: Diagnosis not present

## 2016-08-30 DIAGNOSIS — E782 Mixed hyperlipidemia: Secondary | ICD-10-CM | POA: Diagnosis not present

## 2016-09-05 DIAGNOSIS — G20 Parkinson's disease: Secondary | ICD-10-CM | POA: Diagnosis not present

## 2016-09-05 DIAGNOSIS — E1142 Type 2 diabetes mellitus with diabetic polyneuropathy: Secondary | ICD-10-CM | POA: Diagnosis not present

## 2016-09-05 DIAGNOSIS — G2581 Restless legs syndrome: Secondary | ICD-10-CM | POA: Diagnosis not present

## 2016-09-13 DIAGNOSIS — E291 Testicular hypofunction: Secondary | ICD-10-CM | POA: Diagnosis not present

## 2016-09-17 ENCOUNTER — Encounter: Payer: Self-pay | Admitting: Podiatry

## 2016-09-17 ENCOUNTER — Ambulatory Visit (INDEPENDENT_AMBULATORY_CARE_PROVIDER_SITE_OTHER): Payer: Medicare Other | Admitting: Podiatry

## 2016-09-17 DIAGNOSIS — M79675 Pain in left toe(s): Secondary | ICD-10-CM | POA: Diagnosis not present

## 2016-09-17 DIAGNOSIS — B351 Tinea unguium: Secondary | ICD-10-CM

## 2016-09-17 DIAGNOSIS — M79674 Pain in right toe(s): Secondary | ICD-10-CM

## 2016-09-17 DIAGNOSIS — L84 Corns and callosities: Secondary | ICD-10-CM | POA: Diagnosis not present

## 2016-09-17 NOTE — Progress Notes (Signed)
Subjective: 81 y.o. returns the office today for painful, elongated, thickened toenails which he cannot trim himself. Denies any redness or drainage around the nails. Also has calluses that needs to be trimmed. No redness or drainage. Denies any acute changes since last appointment and no new complaints today. Denies any systemic complaints such as fevers, chills, nausea, vomiting.   Objective: AAO 3, NAD DP/PT pulses palpable, CRT less than 3 seconds Nails hypertrophic, dystrophic, elongated, brittle, discolored 10. There is tenderness overlying the nails 1-5 bilaterally. There is no surrounding erythema or drainage along the nail sites. Hyperkeratotic lesions present bilateral medial hallux and heels medially. No underlying ulceration, drainage or other signs of infection. No open lesions or pre-ulcerative lesions are identified. No other areas of tenderness bilateral lower extremities. No overlying edema, erythema, increased warmth. No pain with calf compression, swelling, warmth, erythema.  Assessment: Patient presents with symptomatic onychomycosis; hyperkeratotic lesions  Plan: -Treatment options including alternatives, risks, complications were discussed -Nails sharply debrided 10 without complication/bleeding. -Hyperkeratotic lesions debrided 4 without couple complications or bleeding. -Discussed daily foot inspection. If there are any changes, to call the office immediately.  -Follow-up in 3 months or sooner if any problems are to arise. In the meantime, encouraged to call the office with any questions, concerns, changes symptoms.  Celesta Gentile, DPM

## 2016-09-23 ENCOUNTER — Telehealth: Payer: Self-pay | Admitting: Podiatry

## 2016-09-23 NOTE — Telephone Encounter (Signed)
Returned patient's phone call at [phone] (Home #). Pt wanted to know how much it would cost to get his nails trimmed on a monthly basis.  I talked to [facility], Librarian, academic, to ask about cost. Pt has Medicare/Medicare Part A and B. She stated, "Cost is $110 and there is no discount if you come every month to get nails trimmed. The price is given to [place] by [facility]". I told patient the cost and that we could not give him a discount. Pt stated they understood and said, "I will have to confer with my wife to see if I can do this on a monthly basis".

## 2016-09-26 DIAGNOSIS — Z85828 Personal history of other malignant neoplasm of skin: Secondary | ICD-10-CM | POA: Diagnosis not present

## 2016-09-26 DIAGNOSIS — D225 Melanocytic nevi of trunk: Secondary | ICD-10-CM | POA: Diagnosis not present

## 2016-09-26 DIAGNOSIS — E291 Testicular hypofunction: Secondary | ICD-10-CM | POA: Diagnosis not present

## 2016-09-26 DIAGNOSIS — D485 Neoplasm of uncertain behavior of skin: Secondary | ICD-10-CM | POA: Diagnosis not present

## 2016-09-26 DIAGNOSIS — L570 Actinic keratosis: Secondary | ICD-10-CM | POA: Diagnosis not present

## 2016-09-26 DIAGNOSIS — L918 Other hypertrophic disorders of the skin: Secondary | ICD-10-CM | POA: Diagnosis not present

## 2016-09-26 DIAGNOSIS — L821 Other seborrheic keratosis: Secondary | ICD-10-CM | POA: Diagnosis not present

## 2016-09-26 DIAGNOSIS — D1801 Hemangioma of skin and subcutaneous tissue: Secondary | ICD-10-CM | POA: Diagnosis not present

## 2016-09-27 DIAGNOSIS — G4733 Obstructive sleep apnea (adult) (pediatric): Secondary | ICD-10-CM | POA: Diagnosis not present

## 2016-09-27 DIAGNOSIS — E1159 Type 2 diabetes mellitus with other circulatory complications: Secondary | ICD-10-CM | POA: Diagnosis not present

## 2016-09-27 DIAGNOSIS — Z955 Presence of coronary angioplasty implant and graft: Secondary | ICD-10-CM | POA: Diagnosis not present

## 2016-09-27 DIAGNOSIS — Z6831 Body mass index (BMI) 31.0-31.9, adult: Secondary | ICD-10-CM | POA: Diagnosis not present

## 2016-09-27 DIAGNOSIS — Z79899 Other long term (current) drug therapy: Secondary | ICD-10-CM | POA: Diagnosis not present

## 2016-09-27 DIAGNOSIS — R5383 Other fatigue: Secondary | ICD-10-CM | POA: Diagnosis not present

## 2016-09-27 DIAGNOSIS — E782 Mixed hyperlipidemia: Secondary | ICD-10-CM | POA: Diagnosis not present

## 2016-09-27 DIAGNOSIS — Z888 Allergy status to other drugs, medicaments and biological substances status: Secondary | ICD-10-CM | POA: Diagnosis not present

## 2016-09-27 DIAGNOSIS — E291 Testicular hypofunction: Secondary | ICD-10-CM | POA: Diagnosis not present

## 2016-09-27 DIAGNOSIS — Z91041 Radiographic dye allergy status: Secondary | ICD-10-CM | POA: Diagnosis not present

## 2016-09-27 DIAGNOSIS — Z880 Allergy status to penicillin: Secondary | ICD-10-CM | POA: Diagnosis not present

## 2016-09-27 DIAGNOSIS — I10 Essential (primary) hypertension: Secondary | ICD-10-CM | POA: Diagnosis not present

## 2016-09-27 DIAGNOSIS — E119 Type 2 diabetes mellitus without complications: Secondary | ICD-10-CM | POA: Diagnosis not present

## 2016-09-27 DIAGNOSIS — G2581 Restless legs syndrome: Secondary | ICD-10-CM | POA: Diagnosis not present

## 2016-09-27 DIAGNOSIS — Z794 Long term (current) use of insulin: Secondary | ICD-10-CM | POA: Diagnosis not present

## 2016-10-08 DIAGNOSIS — R5383 Other fatigue: Secondary | ICD-10-CM | POA: Diagnosis not present

## 2016-10-08 DIAGNOSIS — N3941 Urge incontinence: Secondary | ICD-10-CM | POA: Diagnosis not present

## 2016-10-08 DIAGNOSIS — I10 Essential (primary) hypertension: Secondary | ICD-10-CM | POA: Diagnosis not present

## 2016-10-11 DIAGNOSIS — E291 Testicular hypofunction: Secondary | ICD-10-CM | POA: Diagnosis not present

## 2016-10-15 DIAGNOSIS — F410 Panic disorder [episodic paroxysmal anxiety] without agoraphobia: Secondary | ICD-10-CM | POA: Diagnosis not present

## 2016-10-24 DIAGNOSIS — E291 Testicular hypofunction: Secondary | ICD-10-CM | POA: Diagnosis not present

## 2016-10-29 ENCOUNTER — Ambulatory Visit (HOSPITAL_BASED_OUTPATIENT_CLINIC_OR_DEPARTMENT_OTHER)
Admission: RE | Admit: 2016-10-29 | Discharge: 2016-10-29 | Disposition: A | Discharge status: Home / Self Care | Payer: Medicare Other | Source: Ambulatory Visit | Attending: Podiatry | Admitting: Podiatry

## 2016-10-29 ENCOUNTER — Ambulatory Visit (INDEPENDENT_AMBULATORY_CARE_PROVIDER_SITE_OTHER): Payer: Medicare Other | Admitting: Podiatry

## 2016-10-29 ENCOUNTER — Encounter: Payer: Self-pay | Admitting: Podiatry

## 2016-10-29 DIAGNOSIS — M84375A Stress fracture, left foot, initial encounter for fracture: Secondary | ICD-10-CM | POA: Diagnosis not present

## 2016-10-29 DIAGNOSIS — M7989 Other specified soft tissue disorders: Secondary | ICD-10-CM | POA: Insufficient documentation

## 2016-10-29 DIAGNOSIS — M79675 Pain in left toe(s): Secondary | ICD-10-CM

## 2016-10-29 DIAGNOSIS — M25572 Pain in left ankle and joints of left foot: Secondary | ICD-10-CM | POA: Diagnosis not present

## 2016-10-29 DIAGNOSIS — B351 Tinea unguium: Secondary | ICD-10-CM | POA: Diagnosis not present

## 2016-10-29 DIAGNOSIS — M79672 Pain in left foot: Secondary | ICD-10-CM | POA: Diagnosis not present

## 2016-10-29 DIAGNOSIS — M79674 Pain in right toe(s): Secondary | ICD-10-CM

## 2016-10-29 DIAGNOSIS — L84 Corns and callosities: Secondary | ICD-10-CM

## 2016-10-29 IMAGING — CR DG FOOT COMPLETE 3+V*L*
3 series · 3 of 3 positions shown · non-contrast
Comparison: None.

CLINICAL DATA: Plantar foot pain for 1 week.

EXAM:
LEFT FOOT - COMPLETE 3+ VIEW

[t foot ap left]
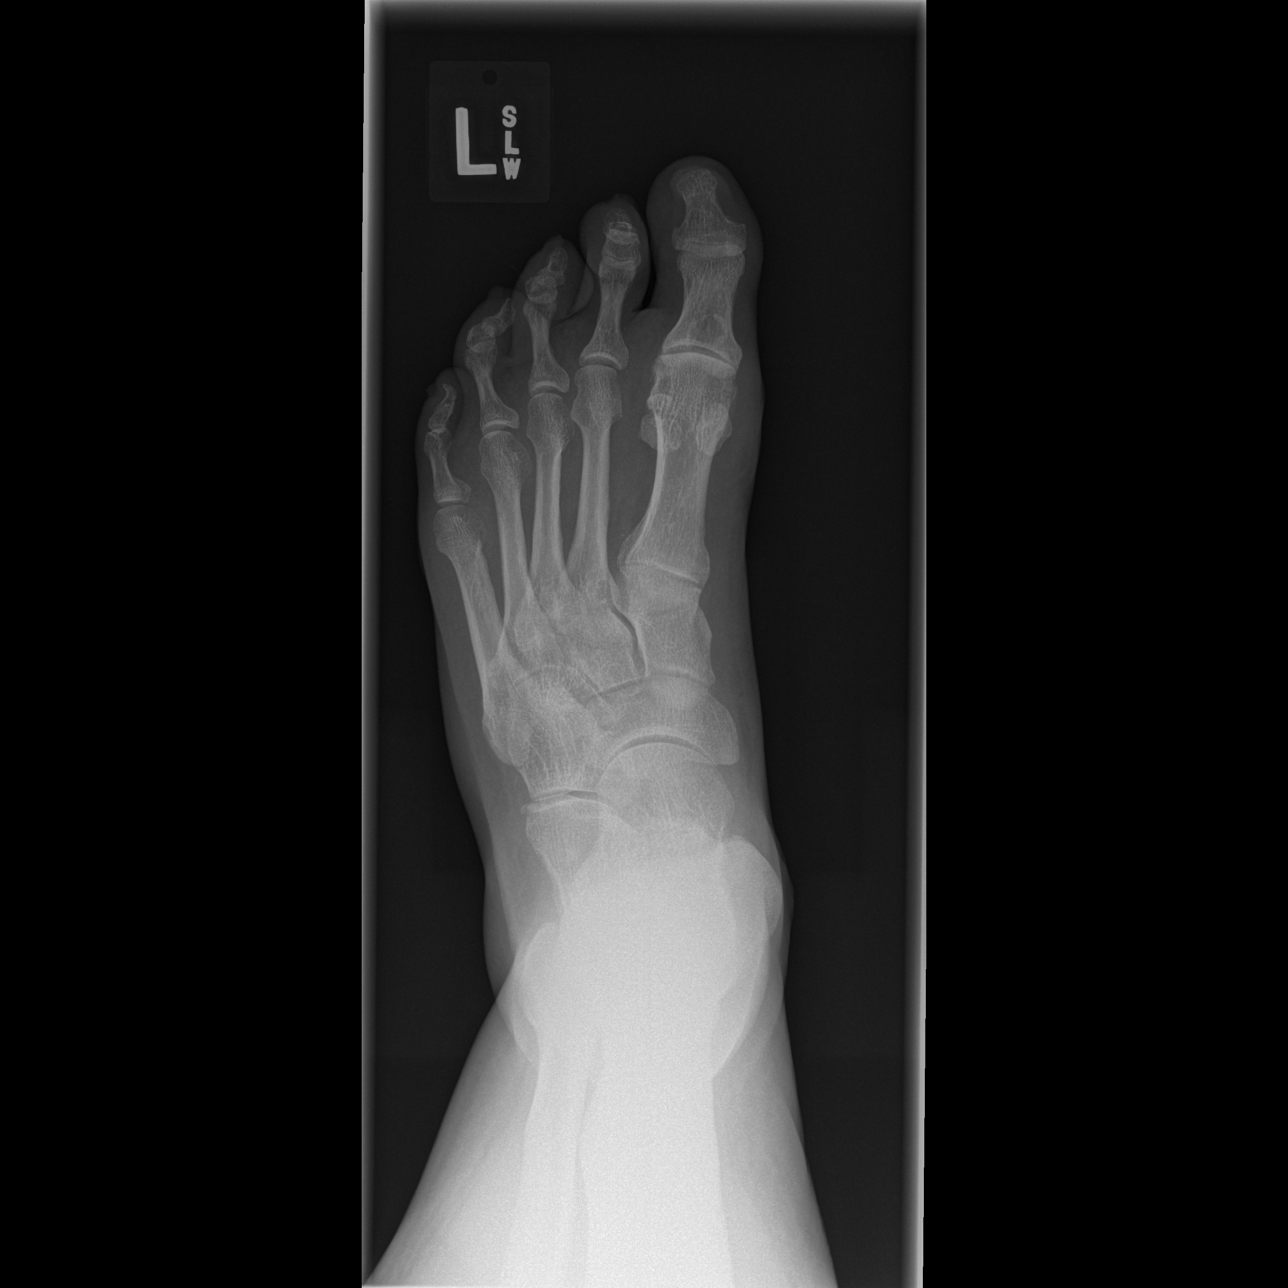

[t foot oblique left]
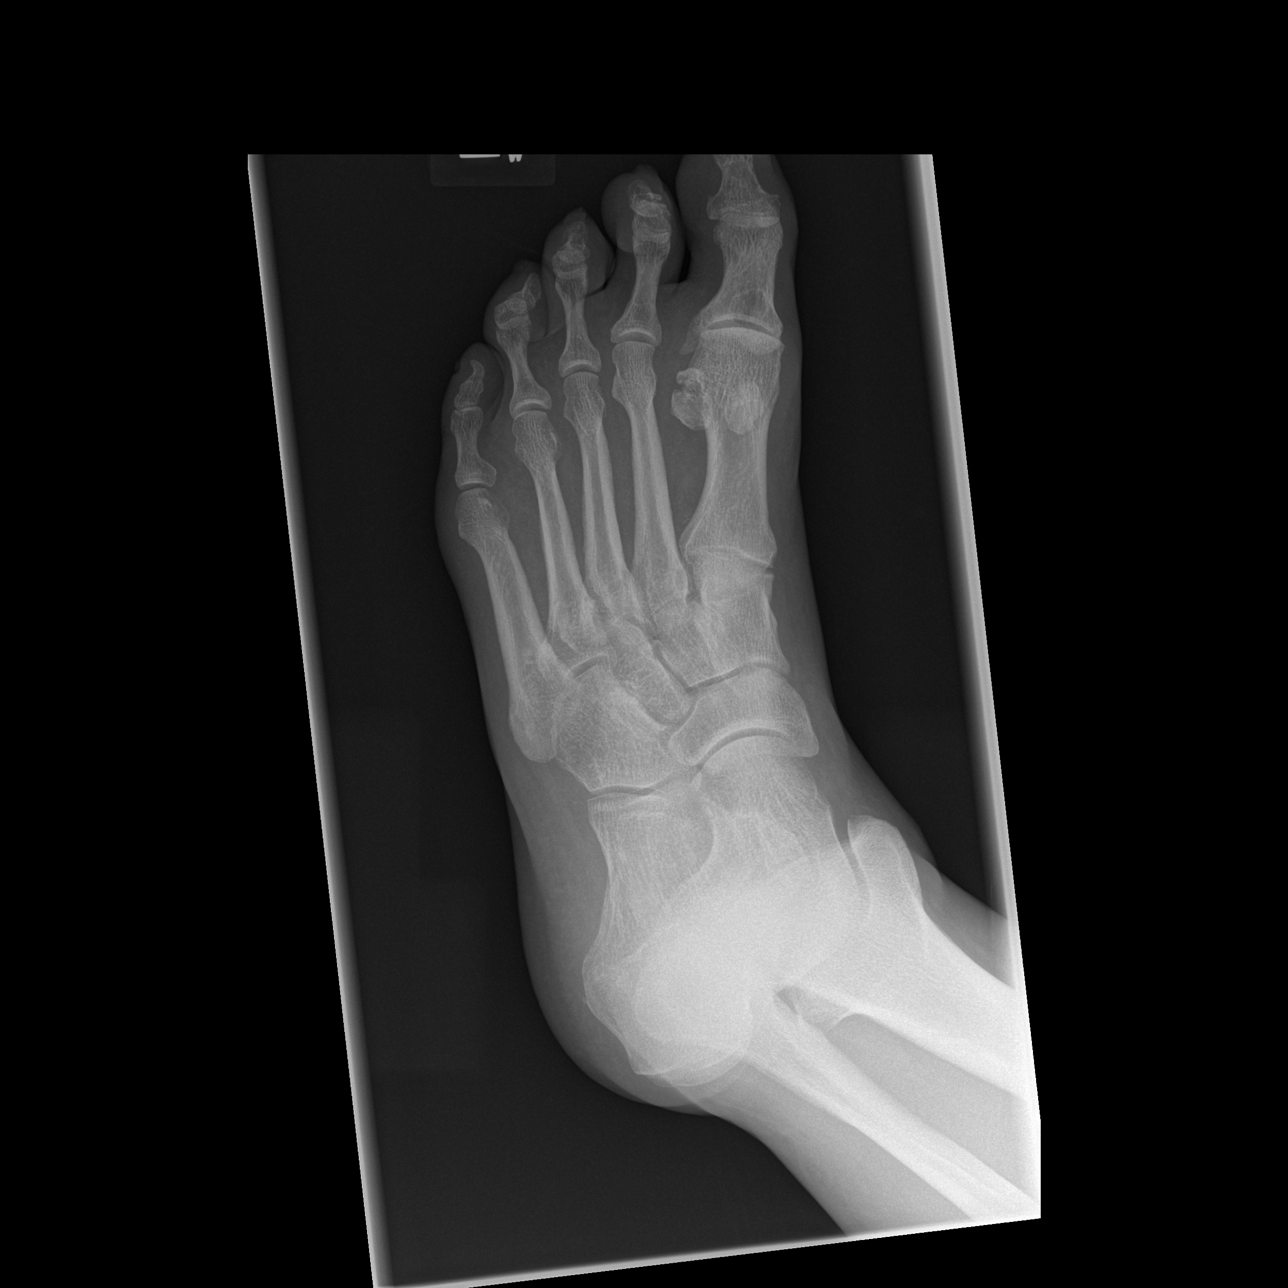

[t foot lat left]
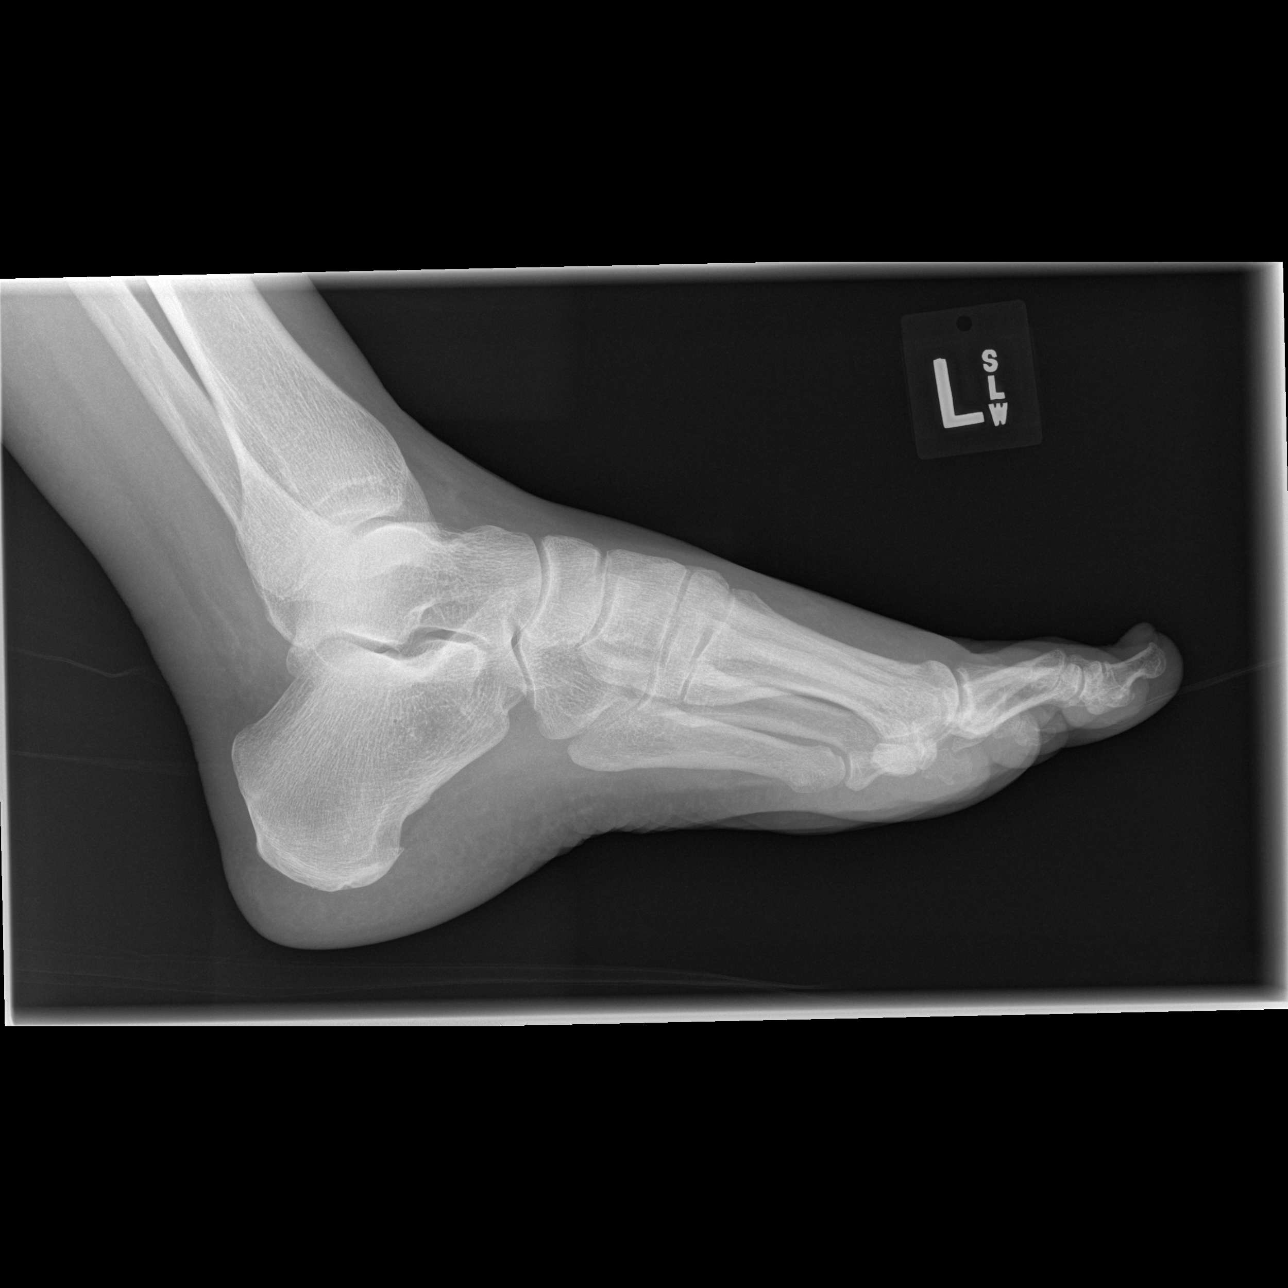

[3 of 3 positions shown; findings below may reference images not displayed]

FINDINGS: There is mild to moderate osteoarthritis of first MTP joint with a
prominent degenerative cyst in the base of the proximal phalanx of
the great toe. The other bones of the foot appear normal with no
evidence of stress fracture or stress reaction. No periosteal new
bone formation.
IMPRESSION: No acute abnormality.  Specifically, no evidence of stress fracture.

Arthritic changes of the first MTP joint.

## 2016-10-29 NOTE — Progress Notes (Signed)
Subjective: 81 y.o. returns the office today for painful, elongated, thickened toenails which he cannot trim himself. Denies any redness or drainage around the nails. Also has calluses that needs to be trimmed but they're doing better. No redness or drainage. He also states he has new concerns today of his left foot. He is no swelling to his left foot which is increased over the last week. He states he has pain to the left foot with dried blood pressure to the foot. Denies any recent injury or trauma. He does work out on a regular basis going to [employer]. Denies any change or increase in activity but the foot is becoming more painful with tried to put weight down. Denies any acute changes since last appointment and no new complaints today. Denies any systemic complaints such as fevers, chills, nausea, vomiting.   Objective: AAO 3, NAD DP/PT pulses palpable, CRT less than 3 seconds Nails hypertrophic, dystrophic, elongated, brittle, discolored 10. There is tenderness overlying the nails 1-5 bilaterally. There is no surrounding erythema or drainage along the nail sites. Hyperkeratotic lesions present bilateral medial hallux and heels medially. No underlying ulceration, drainage or other signs of infection. There is mild to moderate swelling to the left foot and there is no erythema or increase in warmth. The does wish to be tenderness diffusely submetatarsal 1-5 in most of the symptoms are under the sesamoids. There is no other area tenderness within 5 this time. No open lesions or pre-ulcerative lesions are identified. No other areas of tenderness bilateral lower extremities. No overlying edema, erythema, increased warmth. No pain with calf compression, swelling, warmth, erythema.  Assessment: Patient presents with symptomatic onychomycosis; hyperkeratotic lesions; left foot swelling/pain likely result of capsulitis however rule out stress fracture.  Plan: -Treatment options including alternatives,  risks, complications were discussed -Nails sharply debrided 10 without complication/bleeding. -Hyperkeratotic lesions debrided 4 without couple complications or bleeding. -Given the left foot swelling and pain I recommended immobilization in a surgical shoe which was dispensed today. I ordered x-rays today to rule out stress fracture. Elevation. Hold off on exercise for now. -Discussed daily foot inspection. If there are any changes, to call the office immediately.  -Follow-up as scheduled or sooner if any problems are to arise. In the meantime, encouraged to call the office with any questions, concerns, changes symptoms.  Celesta Gentile, DPM

## 2016-10-30 ENCOUNTER — Telehealth: Payer: Self-pay | Admitting: Podiatry

## 2016-10-30 MED ORDER — NONFORMULARY OR COMPOUNDED ITEM: 2 refills | Status: DC

## 2016-10-30 NOTE — Telephone Encounter (Signed)
There is no evidence of fracture. There is arthritis of his big toe joint. Continue to ice/elevate and hold off on going to the gym. If it is more comfortable to wear a regular shoe he can. We can do a topical anti-inflammatory if they would like as well.  Please make sure he has no calf pain, swelling.

## 2016-10-30 NOTE — Addendum Note (Signed)
Addended by: Harriett Sine D on: 10/30/2016 04:42 PM   Modules accepted: Orders

## 2016-10-30 NOTE — Telephone Encounter (Addendum)
Pt's wife, Curt Bears states pt's pain and swelling are much worse today in the foot the leg is not affected, worsened after wearing the shoe. I told Curt Bears to have pt rest, ice 3-4 times daily for 15-20 minutes each session protecting the skin from the ice pack with cloth and I would call with further instructions possibly today or tomorrow, because Dr. Jacqualyn Posey is in surgery. Zadie Rhine Dr. Leigh Aurora 3:52pm orders and she states pt denies calf pain or swelling. I offered the [place] pain Cream Dr. Jacqualyn Posey had recommended and they accepted. Orders faxed to [facility].

## 2016-10-30 NOTE — Telephone Encounter (Signed)
Yes, I am calling for my husband Jason Hartman. He saw Dr. Jacqualyn Posey yesterday because his foot was very swollen and painful. Dr. Jacqualyn Posey did an x-ray but we have not heard the results on the x-ray. Also, Dr. Jacqualyn Posey put him in a support shoe which we think made things worse because this morning his foot is more swollen and very, very painful. Please call me back at [phone].

## 2016-11-01 ENCOUNTER — Telehealth: Payer: Self-pay | Admitting: *Deleted

## 2016-11-01 NOTE — Telephone Encounter (Addendum)
-----   Message from Trula Slade, DPM sent at 10/30/2016  4:00 PM EDT ----- No fracture present. Please let him know. Would stay in surgical shoe unless a regular shoe is more comfortable. Ice/elevation. Hold off on going back to the gym until symptoms resolved.11/01/2016-I informed pt of Dr. Leigh Aurora review of x-ray results and instructions, pt states the surgical shoe makes things worse, is not wearing a shoe at all. I told pt I would tell Dr. Jacqualyn Posey and if he had other instructions I would call again.

## 2016-11-04 ENCOUNTER — Telehealth: Payer: Self-pay | Admitting: Podiatry

## 2016-11-04 ENCOUNTER — Other Ambulatory Visit: Payer: Self-pay | Admitting: Podiatry

## 2016-11-04 MED ORDER — MELOXICAM 15 MG PO TABS: 15.0000 mg | ORAL_TABLET | Freq: Every day | ORAL | 0 refills | Status: DC

## 2016-11-04 NOTE — Telephone Encounter (Signed)
I spoke with pt's wife, Curt Bears, I informed that arthritis when flared can take more than a few days to get under control, to have pt continue to rest and use the antiinflammatory cream.

## 2016-11-04 NOTE — Telephone Encounter (Signed)
Yes I'm calling about my husband Jason Hartman. He saw Dr. Jacqualyn Posey last week and we were told the x-ray showed arthritis per the nurse. He started using the antiinflammatory cream from [employer] this weekend. His foot is just not any better. It still has a lot of swelling, still very red on the bottom, and is still unable to walk on his foot. He has been keeping the foot elevated. We have been using ice packs on it, used the cream, and he had to take some hydrocodone today. We just wanted to know if Dr. Jacqualyn Posey or someone would know how long it would take this arthritis to go down and he would be back to normal. I actually thought it would be today. If someone could please call us back at [phone].

## 2016-11-04 NOTE — Telephone Encounter (Signed)
Please prescribe mobic 15mg  1 tab PO daily x 10 days.

## 2016-11-06 ENCOUNTER — Telehealth: Payer: Self-pay | Admitting: Podiatry

## 2016-11-06 NOTE — Telephone Encounter (Signed)
I informed pt of Dr. Leigh Aurora recommendations. Pt states he would like to discuss and call back.

## 2016-11-06 NOTE — Telephone Encounter (Signed)
It is tough because I don't want to do an oral steroid due to diabetes. We can do a steroid injection.

## 2016-11-06 NOTE — Telephone Encounter (Signed)
Pt's wife called saying that pt's endocrinologist stated pt should not take meloxicam or any anti-inflammatory or NSAID due to previous bleeding ulcer and non bleeding ulcers. Wants to know if there is anything else they can do besides using the compound cream from [employer].

## 2016-11-12 ENCOUNTER — Ambulatory Visit (INDEPENDENT_AMBULATORY_CARE_PROVIDER_SITE_OTHER): Payer: Medicare Other | Admitting: Podiatry

## 2016-11-12 ENCOUNTER — Encounter: Payer: Self-pay | Admitting: Podiatry

## 2016-11-12 VITALS — BP 178/70 | HR 56 | Temp 97.5°F

## 2016-11-12 DIAGNOSIS — M109 Gout, unspecified: Secondary | ICD-10-CM | POA: Diagnosis not present

## 2016-11-12 DIAGNOSIS — M779 Enthesopathy, unspecified: Secondary | ICD-10-CM | POA: Diagnosis not present

## 2016-11-12 MED ORDER — COLCHICINE 0.6 MG PO TABS: 0.6000 mg | ORAL_TABLET | Freq: Every day | ORAL | 0 refills | Status: DC

## 2016-11-12 NOTE — Progress Notes (Signed)
Subjective: Mr. Divis presents the ossicular. Evaluation of left foot swelling, redness and pain. He states the pain has gotten better but still somewhat red and swollen he states that it feels tight. He denies any recent injury or trauma. Denies any systemic complaints such as fevers, chills, nausea, vomiting. No acute changes since last appointment, and no other complaints at this time.   Objective: AAO x3, NAD DP/PT pulses palpable bilaterally, CRT less than 3 seconds Discontinued edema and erythema most in the first MTPJ and today there is tenderness along the entire first MPJ although it is getting better. There is no areas of fluctuance or crepitus is no open sores. There is no ascending saline disc. No open lesions or pre-ulcerative lesions and apparently. There is no pain with calf compression, swelling, warmth, erythema.  Assessment: 81 year old male left first MTPJ capsulitis, likely gout  Plan: -Treatment options discussed including all alternatives, risks, and complications -Etiology of symptoms were discussed -Discussed the steroid injection he wishes to proceed. Today a mixture of Kenalog local anesthetic was infiltrated into and around the first MTPJ on the left foot. He tolerated this well, complications. Post injection care was discussed. -Colchicine prescribed -Bloodwork ordered today -Follow up in 2 weeks or sooner if needed. If he is doing well seen back for routine care.  Celesta Gentile, DPM

## 2016-11-13 LAB — COMPREHENSIVE METABOLIC PANEL
A/G RATIO: 1.7 (ref 1.2–2.2)
ALT: 32 IU/L (ref 0–44)
AST: 23 IU/L (ref 0–40)
Albumin: 4.3 g/dL (ref 3.5–4.8)
Alkaline Phosphatase: 46 IU/L (ref 39–117)
BILIRUBIN TOTAL: 0.4 mg/dL (ref 0.0–1.2)
BUN / CREAT RATIO: 19 (ref 10–24)
BUN: 27 mg/dL (ref 8–27)
CHLORIDE: 102 mmol/L (ref 96–106)
CO2: 25 mmol/L (ref 20–29)
Calcium: 9.2 mg/dL (ref 8.6–10.2)
Creatinine, Ser: 1.41 mg/dL — ABNORMAL HIGH (ref 0.76–1.27)
GFR calc Af Amer: 55 mL/min/{1.73_m2} — ABNORMAL LOW (ref >59)
GFR calc non Af Amer: 47 mL/min/{1.73_m2} — ABNORMAL LOW (ref >59)
Globulin, Total: 2.6 g/dL (ref 1.5–4.5)
Glucose: 151 mg/dL — ABNORMAL HIGH (ref 65–99)
POTASSIUM: 4.3 mmol/L (ref 3.5–5.2)
Sodium: 143 mmol/L (ref 134–144)
Total Protein: 6.9 g/dL (ref 6.0–8.5)

## 2016-11-13 LAB — CBC WITH DIFFERENTIAL/PLATELET
BASOS ABS: 0 10*3/uL (ref 0.0–0.2)
Basos: 0 %
EOS (ABSOLUTE): 0.2 10*3/uL (ref 0.0–0.4)
EOS: 2 %
HEMATOCRIT: 41.9 % (ref 37.5–51.0)
Hemoglobin: 13.2 g/dL (ref 13.0–17.7)
Immature Grans (Abs): 0 10*3/uL (ref 0.0–0.1)
Immature Granulocytes: 0 %
LYMPHS ABS: 1.4 10*3/uL (ref 0.7–3.1)
Lymphs: 14 %
MCH: 26.2 pg — ABNORMAL LOW (ref 26.6–33.0)
MCHC: 31.5 g/dL (ref 31.5–35.7)
MCV: 83 fL (ref 79–97)
MONOS ABS: 0.5 10*3/uL (ref 0.1–0.9)
Monocytes: 5 %
NEUTROS PCT: 79 %
Neutrophils Absolute: 7.7 10*3/uL — ABNORMAL HIGH (ref 1.4–7.0)
PLATELETS: 185 10*3/uL (ref 150–379)
RBC: 5.03 x10E6/uL (ref 4.14–5.80)
RDW: 15.2 % (ref 12.3–15.4)
WBC: 9.8 10*3/uL (ref 3.4–10.8)

## 2016-11-13 LAB — C-REACTIVE PROTEIN: CRP: 7.8 mg/L — AB (ref 0.0–4.9)

## 2016-11-13 LAB — SEDIMENTATION RATE: SED RATE: 4 mm/h (ref 0–30)

## 2016-11-13 LAB — URIC ACID: Uric Acid: 8.4 mg/dL (ref 3.7–8.6)

## 2016-11-14 DIAGNOSIS — E291 Testicular hypofunction: Secondary | ICD-10-CM | POA: Diagnosis not present

## 2016-11-15 NOTE — Telephone Encounter (Signed)
-----   Message from Trula Slade, DPM sent at 11/15/2016  6:32 AM EDT ----- Val- please also send these results to his PCP due to kidney function. They are not in epic. Thank you.

## 2016-11-15 NOTE — Telephone Encounter (Deleted)
-----   Message from Trula Slade, DPM sent at 11/15/2016  6:32 AM EDT ----- Val- please also send these results to his PCP due to kidney function. They are not in epic. Thank you.

## 2016-11-15 NOTE — Telephone Encounter (Signed)
-----   Message from Trula Slade, DPM sent at 11/15/2016  6:31 AM EDT ----- Uric acid is high normal. CRP is elevated and his kidney function is slightly high. Please let him know.

## 2016-11-15 NOTE — Telephone Encounter (Signed)
Faxed copy of 11/12/2016 labs to Dr. Jeralene Huff. Left message for pt to call for results and that labs had been faxed to Dr. Jeralene Huff.

## 2016-11-18 ENCOUNTER — Telehealth: Payer: Self-pay

## 2016-11-18 DIAGNOSIS — N529 Male erectile dysfunction, unspecified: Secondary | ICD-10-CM | POA: Diagnosis not present

## 2016-11-18 DIAGNOSIS — Z6830 Body mass index (BMI) 30.0-30.9, adult: Secondary | ICD-10-CM | POA: Diagnosis not present

## 2016-11-18 NOTE — Telephone Encounter (Signed)
Patient regarding lab results, advised to continue medication for gout until complete. He states that his foot is feeling much better.   Will fax lab results to PCP, patient is to follow up at regularly scheduled appt or sooner is problems arise

## 2016-11-18 NOTE — Telephone Encounter (Signed)
Faxed lab results to Dr Jeralene Huff at [facility] [phone]

## 2016-11-26 DIAGNOSIS — H43812 Vitreous degeneration, left eye: Secondary | ICD-10-CM | POA: Diagnosis not present

## 2016-11-26 DIAGNOSIS — H35372 Puckering of macula, left eye: Secondary | ICD-10-CM | POA: Diagnosis not present

## 2016-11-26 DIAGNOSIS — E119 Type 2 diabetes mellitus without complications: Secondary | ICD-10-CM | POA: Diagnosis not present

## 2016-11-29 DIAGNOSIS — E291 Testicular hypofunction: Secondary | ICD-10-CM | POA: Diagnosis not present

## 2016-12-12 DIAGNOSIS — G2581 Restless legs syndrome: Secondary | ICD-10-CM | POA: Diagnosis not present

## 2016-12-12 DIAGNOSIS — F419 Anxiety disorder, unspecified: Secondary | ICD-10-CM | POA: Diagnosis not present

## 2016-12-12 DIAGNOSIS — M545 Low back pain: Secondary | ICD-10-CM | POA: Diagnosis not present

## 2016-12-13 DIAGNOSIS — E291 Testicular hypofunction: Secondary | ICD-10-CM | POA: Diagnosis not present

## 2016-12-17 ENCOUNTER — Encounter: Payer: Self-pay | Admitting: Podiatry

## 2016-12-17 ENCOUNTER — Ambulatory Visit (INDEPENDENT_AMBULATORY_CARE_PROVIDER_SITE_OTHER): Payer: Medicare Other | Admitting: Podiatry

## 2016-12-17 DIAGNOSIS — L84 Corns and callosities: Secondary | ICD-10-CM | POA: Diagnosis not present

## 2016-12-17 DIAGNOSIS — M79674 Pain in right toe(s): Secondary | ICD-10-CM | POA: Diagnosis not present

## 2016-12-17 DIAGNOSIS — M79675 Pain in left toe(s): Secondary | ICD-10-CM | POA: Diagnosis not present

## 2016-12-17 DIAGNOSIS — E1149 Type 2 diabetes mellitus with other diabetic neurological complication: Secondary | ICD-10-CM | POA: Diagnosis not present

## 2016-12-17 DIAGNOSIS — B351 Tinea unguium: Secondary | ICD-10-CM

## 2016-12-17 NOTE — Progress Notes (Signed)
Subjective: 81 y.o. returns the office today for painful, elongated, thickened toenails which he cannot trim himself. Denies any redness or drainage around the nails. Denies any acute changes since last appointment and no new complaints today. Denies any systemic complaints such as fevers, chills, nausea, vomiting.   Since last appointment he states that he is starting to get pain to his right foot consistent with gout he had some cultures he only took it has resolved the pain.  Objective: AAO 3, NAD DP/PT pulses palpable, CRT less than 3 seconds Protective sensation decreaced with Derrel Nip monofilament  Nails hypertrophic, dystrophic, elongated, brittle, discolored 10. There is tenderness overlying the nails 1-5 bilaterally. There is no surrounding erythema or drainage along the nail sites. Hyperkeratotic lesion left foot submetatarsal 5 as well as the right medial hallux. No underlying ulceration, drainage or any signs of infection are noted today. No open lesions or other pre-ulcerative lesions are identified. There is no severe erythema or areas of tenderness at the repetitive the foot there is no evidence of gout today. No other areas of tenderness bilateral lower extremities. No overlying edema, erythema, increased warmth. No pain with calf compression, swelling, warmth, erythema.  Assessment: Patient presents with symptomatic onychomycosis, hyperkeratotic lesions  Plan: -Treatment options including alternatives, risks, complications were discussed -Nails sharply debrided 10 without complication/bleeding. -Hyperkeratotic lesion sharply debrided 2 without any complications or bleeding. -Discussed daily foot inspection. If there are any changes, to call the office immediately.  -Follow-up in 6 weeks at his request or sooner if any problems are to arise. In the meantime, encouraged to call the office with any questions, concerns, changes symptoms.  Celesta Gentile, DPM

## 2016-12-27 DIAGNOSIS — Z6831 Body mass index (BMI) 31.0-31.9, adult: Secondary | ICD-10-CM | POA: Diagnosis not present

## 2016-12-27 DIAGNOSIS — F5104 Psychophysiologic insomnia: Secondary | ICD-10-CM | POA: Diagnosis not present

## 2016-12-27 DIAGNOSIS — E1159 Type 2 diabetes mellitus with other circulatory complications: Secondary | ICD-10-CM | POA: Diagnosis not present

## 2016-12-27 DIAGNOSIS — Z880 Allergy status to penicillin: Secondary | ICD-10-CM | POA: Diagnosis not present

## 2016-12-27 DIAGNOSIS — Z9861 Coronary angioplasty status: Secondary | ICD-10-CM | POA: Diagnosis not present

## 2016-12-27 DIAGNOSIS — E782 Mixed hyperlipidemia: Secondary | ICD-10-CM | POA: Diagnosis not present

## 2016-12-27 DIAGNOSIS — Z888 Allergy status to other drugs, medicaments and biological substances status: Secondary | ICD-10-CM | POA: Diagnosis not present

## 2016-12-27 DIAGNOSIS — I10 Essential (primary) hypertension: Secondary | ICD-10-CM | POA: Diagnosis not present

## 2016-12-27 DIAGNOSIS — G2581 Restless legs syndrome: Secondary | ICD-10-CM | POA: Diagnosis not present

## 2016-12-27 DIAGNOSIS — E1169 Type 2 diabetes mellitus with other specified complication: Secondary | ICD-10-CM | POA: Diagnosis not present

## 2016-12-27 DIAGNOSIS — Z9989 Dependence on other enabling machines and devices: Secondary | ICD-10-CM | POA: Diagnosis not present

## 2016-12-27 DIAGNOSIS — G4733 Obstructive sleep apnea (adult) (pediatric): Secondary | ICD-10-CM | POA: Diagnosis not present

## 2016-12-27 DIAGNOSIS — R5383 Other fatigue: Secondary | ICD-10-CM | POA: Diagnosis not present

## 2016-12-27 DIAGNOSIS — Z794 Long term (current) use of insulin: Secondary | ICD-10-CM | POA: Diagnosis not present

## 2016-12-30 DIAGNOSIS — E291 Testicular hypofunction: Secondary | ICD-10-CM | POA: Diagnosis not present

## 2017-01-14 DIAGNOSIS — E291 Testicular hypofunction: Secondary | ICD-10-CM | POA: Diagnosis not present

## 2017-01-21 ENCOUNTER — Encounter: Payer: Self-pay | Admitting: Podiatry

## 2017-01-21 ENCOUNTER — Ambulatory Visit (INDEPENDENT_AMBULATORY_CARE_PROVIDER_SITE_OTHER): Payer: Medicare Other | Admitting: Podiatry

## 2017-01-21 DIAGNOSIS — B351 Tinea unguium: Secondary | ICD-10-CM | POA: Diagnosis not present

## 2017-01-21 DIAGNOSIS — M79675 Pain in left toe(s): Secondary | ICD-10-CM | POA: Diagnosis not present

## 2017-01-21 DIAGNOSIS — M79674 Pain in right toe(s): Secondary | ICD-10-CM

## 2017-01-21 DIAGNOSIS — M10072 Idiopathic gout, left ankle and foot: Secondary | ICD-10-CM

## 2017-01-21 DIAGNOSIS — M79676 Pain in unspecified toe(s): Secondary | ICD-10-CM

## 2017-01-21 NOTE — Progress Notes (Signed)
Subjective: 81 y.o. returns the office today for painful, elongated, thickened toenails which he cannot trim himself. Denies any redness or drainage around the nails. Denies any acute changes since last appointment and no new complaints today. Denies any systemic complaints such as fevers, chills, nausea, vomiting.   He states he still gets swelling to the left foot that he no longer has any pain has not had any redness or warmth.  Objective: AAO 3, NAD DP/PT pulses palpable, CRT less than 3 seconds Protective sensation decreaced with Derrel Nip monofilament  Nails hypertrophic, dystrophic, elongated, brittle, discolored 10. There is tenderness overlying the nails 1-5 bilaterally. There is no surrounding erythema or drainage along the nail sites. There is still some mild swelling to the left foot and there is no associated erythema or increase in warmth. There is no area of tenderness is no pain with MPJ, midtarsal, ankle or subtalar joint range of motion. No other areas of tenderness bilateral lower extremities. No overlying edema, erythema, increased warmth. No pain with calf compression, swelling, warmth, erythema.  Assessment: Patient presents with symptomatic onychomycosis, continue swelling  Plan: -Treatment options including alternatives, risks, complications were discussed -Nails sharply debrided 10 without complication/bleeding. -Will check uric acid level given the swelling to left foot however not likely an acute event. Compression anklet was also dispensed. -Discussed daily foot inspection. If there are any changes, to call the office immediately.  -Follow-up in 6 weeks at his request or sooner if any problems are to arise. In the meantime, encouraged to call the office with any questions, concerns, changes symptoms.  Celesta Gentile, DPM

## 2017-01-23 DIAGNOSIS — F410 Panic disorder [episodic paroxysmal anxiety] without agoraphobia: Secondary | ICD-10-CM | POA: Diagnosis not present

## 2017-01-24 ENCOUNTER — Other Ambulatory Visit: Payer: Self-pay | Admitting: Podiatry

## 2017-01-24 DIAGNOSIS — M10072 Idiopathic gout, left ankle and foot: Secondary | ICD-10-CM | POA: Diagnosis not present

## 2017-01-25 LAB — URIC ACID: Uric Acid: 9.7 mg/dL — ABNORMAL HIGH (ref 3.7–8.6)

## 2017-01-28 ENCOUNTER — Ambulatory Visit: Payer: Medicare Other | Admitting: Podiatry

## 2017-01-31 ENCOUNTER — Telehealth: Payer: Self-pay | Admitting: *Deleted

## 2017-01-31 DIAGNOSIS — M109 Gout, unspecified: Secondary | ICD-10-CM

## 2017-01-31 MED ORDER — ALLOPURINOL 100 MG PO TABS
ORAL_TABLET | ORAL | 0 refills | Status: DC
Start: 2017-01-31 — End: 2017-02-03

## 2017-01-31 NOTE — Telephone Encounter (Addendum)
-----   Message from Trula Slade, DPM sent at 01/30/2017  7:17 AM EDT ----- Uric acid is elevated and higher than before. Go ahead and start allopurinol 200mg  daily and recheck uric acid in 2 weeks. Val- can you please order this and let him know? Thanks.01/31/2017-Left message informing pt Dr.Wagoner had reviewed labs and to call for instructions.

## 2017-02-03 DIAGNOSIS — E291 Testicular hypofunction: Secondary | ICD-10-CM | POA: Diagnosis not present

## 2017-02-03 MED ORDER — ALLOPURINOL 100 MG PO TABS: ORAL_TABLET | ORAL | 0 refills | Status: DC

## 2017-02-03 NOTE — Telephone Encounter (Signed)
Pt's wife, Juliann Pulse states she got my message and that pt's pharmacy is [facility] at [address].

## 2017-02-03 NOTE — Telephone Encounter (Signed)
Pt returned call from last week for results and instructions. Left message informing pt of Dr. Leigh Aurora review of results and order, and to call with concerns.

## 2017-02-03 NOTE — Addendum Note (Signed)
Addended by: Harriett Sine D on: 02/03/2017 04:07 PM   Modules accepted: Orders

## 2017-02-14 DIAGNOSIS — Z23 Encounter for immunization: Secondary | ICD-10-CM | POA: Diagnosis not present

## 2017-02-18 DIAGNOSIS — E291 Testicular hypofunction: Secondary | ICD-10-CM | POA: Diagnosis not present

## 2017-02-21 DIAGNOSIS — R197 Diarrhea, unspecified: Secondary | ICD-10-CM | POA: Diagnosis not present

## 2017-02-24 ENCOUNTER — Telehealth: Payer: Self-pay | Admitting: *Deleted

## 2017-02-24 DIAGNOSIS — R197 Diarrhea, unspecified: Secondary | ICD-10-CM | POA: Diagnosis not present

## 2017-02-24 DIAGNOSIS — M109 Gout, unspecified: Secondary | ICD-10-CM

## 2017-02-24 NOTE — Telephone Encounter (Signed)
Dr. Jacqualyn Posey states pt is a [facility] in [place], please orders Uric acid.

## 2017-02-25 DIAGNOSIS — M109 Gout, unspecified: Secondary | ICD-10-CM | POA: Diagnosis not present

## 2017-02-26 LAB — URIC ACID: Uric Acid: 6.8 mg/dL (ref 3.7–8.6)

## 2017-03-04 ENCOUNTER — Telehealth: Payer: Self-pay | Admitting: *Deleted

## 2017-03-04 ENCOUNTER — Other Ambulatory Visit: Payer: Self-pay | Admitting: Podiatry

## 2017-03-04 NOTE — Telephone Encounter (Signed)
-----   Message from Trula Slade, DPM sent at 02/26/2017 11:27 AM EST ----- Please let him know that is uric acid level is normal. Thank you.

## 2017-03-04 NOTE — Telephone Encounter (Signed)
Left message informing pt's wife to have patient to continue the allopurinol as prescribed until seen next week.

## 2017-03-04 NOTE — Telephone Encounter (Signed)
Yes continue the allopurinol at the same dose until I see him. We will look at the next step at his next appointment. Will consider MRI vs. Venous reflux study. He can come in earlier if he needs.

## 2017-03-04 NOTE — Telephone Encounter (Addendum)
Left message to call for results. Pt called and I informed of Dr. Leigh Aurora review of results. Pt states he is not having any pain, but continues to have a lot of swelling, does he need to continue the allopurinol, has an appt 03/11/2017. I told pt I would inform Dr. Jacqualyn Posey and call with further instructions.-

## 2017-03-05 DIAGNOSIS — E291 Testicular hypofunction: Secondary | ICD-10-CM | POA: Diagnosis not present

## 2017-03-05 DIAGNOSIS — N529 Male erectile dysfunction, unspecified: Secondary | ICD-10-CM | POA: Diagnosis not present

## 2017-03-05 DIAGNOSIS — N400 Enlarged prostate without lower urinary tract symptoms: Secondary | ICD-10-CM | POA: Diagnosis not present

## 2017-03-06 NOTE — Telephone Encounter (Signed)
Allopurinol approved by Dr. Jacqualyn Posey.

## 2017-03-11 ENCOUNTER — Encounter: Payer: Self-pay | Admitting: Podiatry

## 2017-03-11 ENCOUNTER — Ambulatory Visit (INDEPENDENT_AMBULATORY_CARE_PROVIDER_SITE_OTHER): Payer: Medicare Other | Admitting: Podiatry

## 2017-03-11 DIAGNOSIS — B351 Tinea unguium: Secondary | ICD-10-CM

## 2017-03-11 DIAGNOSIS — E1149 Type 2 diabetes mellitus with other diabetic neurological complication: Secondary | ICD-10-CM | POA: Diagnosis not present

## 2017-03-11 DIAGNOSIS — M79674 Pain in right toe(s): Secondary | ICD-10-CM

## 2017-03-11 DIAGNOSIS — M79675 Pain in left toe(s): Secondary | ICD-10-CM

## 2017-03-11 DIAGNOSIS — R609 Edema, unspecified: Secondary | ICD-10-CM

## 2017-03-11 MED ORDER — ALLOPURINOL 100 MG PO TABS: ORAL_TABLET | ORAL | 0 refills | Status: DC

## 2017-03-12 DIAGNOSIS — H903 Sensorineural hearing loss, bilateral: Secondary | ICD-10-CM | POA: Diagnosis not present

## 2017-03-12 NOTE — Progress Notes (Signed)
Subjective: 81 y.o. returns the office today for painful, elongated, thickened toenails which he cannot trim himself. Denies any redness or drainage around the nails.  He is also concerned of the left foot is still swelling.  He was treated for gout and he was on allopurinol which he is still been taking.  He says swelling has improved but it does continue.  He denies any change in medications when the swelling started.  He denies any other issues.  Denies any acute changes since last appointment and no new complaints today. Denies any systemic complaints such as fevers, chills, nausea, vomiting.   PCP: Jefm Petty, MD  Objective: AAO 3, NAD DP/PT pulses palpable, CRT less than 3 seconds Protective sensation decreased with Simms Weinstein monofilament Nails hypertrophic, dystrophic, elongated, brittle, discolored 10. There is tenderness overlying the nails 1-5 bilaterally. There is no surrounding erythema or drainage along the nail sites. No open lesions or pre-ulcerative lesions are identified. There is still swelling to the left foot although it does appear to be decreased today.  There is no erythema or increase in warmth.  There is no area of tenderness.  There is pitting edema to the ankle on the left side.  No significant swelling to the right side.  There is no pain with calf compression, swelling, warmth, erythema.  The calf is supple. No other areas of tenderness bilateral lower extremities. No overlying edema, erythema, increased warmth.  Assessment: Patient presents with symptomatic onychomycosis continue left lower extremity swelling;   Plan: -Treatment options including alternatives, risks, complications were discussed -Nails sharply debrided 10 without complication/bleeding. Regard to the swelling I have ordered a venous reflux study.-We will continue allopurinol until his next appointment.  His uric acid levels returned to normal.  Also recommend to follow-up with his  primary care physician.  Discussed compression sock. -Discussed daily foot inspection. If there are any changes, to call the office immediately.  -Follow-up in 6 weeks or sooner if any problems are to arise. In the meantime, encouraged to call the office with any questions, concerns, changes symptoms.  Celesta Gentile, DPM

## 2017-03-13 ENCOUNTER — Other Ambulatory Visit: Payer: Self-pay

## 2017-03-13 DIAGNOSIS — R609 Edema, unspecified: Secondary | ICD-10-CM

## 2017-03-13 DIAGNOSIS — M79674 Pain in right toe(s): Secondary | ICD-10-CM

## 2017-03-13 DIAGNOSIS — M79675 Pain in left toe(s): Principal | ICD-10-CM

## 2017-03-19 DIAGNOSIS — E291 Testicular hypofunction: Secondary | ICD-10-CM | POA: Diagnosis not present

## 2017-03-21 DIAGNOSIS — D1801 Hemangioma of skin and subcutaneous tissue: Secondary | ICD-10-CM | POA: Diagnosis not present

## 2017-03-21 DIAGNOSIS — D225 Melanocytic nevi of trunk: Secondary | ICD-10-CM | POA: Diagnosis not present

## 2017-03-21 DIAGNOSIS — L821 Other seborrheic keratosis: Secondary | ICD-10-CM | POA: Diagnosis not present

## 2017-03-21 DIAGNOSIS — L570 Actinic keratosis: Secondary | ICD-10-CM | POA: Diagnosis not present

## 2017-03-21 DIAGNOSIS — Z85828 Personal history of other malignant neoplasm of skin: Secondary | ICD-10-CM | POA: Diagnosis not present

## 2017-04-03 DIAGNOSIS — E291 Testicular hypofunction: Secondary | ICD-10-CM | POA: Diagnosis not present

## 2017-04-10 DIAGNOSIS — R5383 Other fatigue: Secondary | ICD-10-CM | POA: Diagnosis not present

## 2017-04-10 DIAGNOSIS — N3941 Urge incontinence: Secondary | ICD-10-CM | POA: Diagnosis not present

## 2017-04-10 DIAGNOSIS — I10 Essential (primary) hypertension: Secondary | ICD-10-CM | POA: Diagnosis not present

## 2017-04-10 DIAGNOSIS — G4700 Insomnia, unspecified: Secondary | ICD-10-CM | POA: Diagnosis not present

## 2017-04-11 DIAGNOSIS — N1831 Chronic kidney disease, stage 3a: Secondary | ICD-10-CM | POA: Insufficient documentation

## 2017-04-11 DIAGNOSIS — N183 Chronic kidney disease, stage 3 (moderate): Secondary | ICD-10-CM

## 2017-04-14 DIAGNOSIS — E119 Type 2 diabetes mellitus without complications: Secondary | ICD-10-CM | POA: Diagnosis not present

## 2017-04-14 DIAGNOSIS — Z794 Long term (current) use of insulin: Secondary | ICD-10-CM | POA: Diagnosis not present

## 2017-04-14 DIAGNOSIS — Z79899 Other long term (current) drug therapy: Secondary | ICD-10-CM | POA: Diagnosis not present

## 2017-04-14 DIAGNOSIS — I10 Essential (primary) hypertension: Secondary | ICD-10-CM | POA: Diagnosis not present

## 2017-04-14 DIAGNOSIS — Z888 Allergy status to other drugs, medicaments and biological substances status: Secondary | ICD-10-CM | POA: Diagnosis not present

## 2017-04-14 DIAGNOSIS — G2581 Restless legs syndrome: Secondary | ICD-10-CM | POA: Diagnosis not present

## 2017-04-14 DIAGNOSIS — Z880 Allergy status to penicillin: Secondary | ICD-10-CM | POA: Diagnosis not present

## 2017-04-14 DIAGNOSIS — Z955 Presence of coronary angioplasty implant and graft: Secondary | ICD-10-CM | POA: Diagnosis not present

## 2017-04-14 DIAGNOSIS — E785 Hyperlipidemia, unspecified: Secondary | ICD-10-CM | POA: Diagnosis not present

## 2017-04-14 DIAGNOSIS — I2510 Atherosclerotic heart disease of native coronary artery without angina pectoris: Secondary | ICD-10-CM | POA: Diagnosis not present

## 2017-04-14 DIAGNOSIS — E1159 Type 2 diabetes mellitus with other circulatory complications: Secondary | ICD-10-CM | POA: Diagnosis not present

## 2017-04-14 DIAGNOSIS — G4733 Obstructive sleep apnea (adult) (pediatric): Secondary | ICD-10-CM | POA: Diagnosis not present

## 2017-04-14 DIAGNOSIS — F5104 Psychophysiologic insomnia: Secondary | ICD-10-CM | POA: Diagnosis not present

## 2017-04-14 DIAGNOSIS — E291 Testicular hypofunction: Secondary | ICD-10-CM | POA: Diagnosis not present

## 2017-04-17 DIAGNOSIS — F419 Anxiety disorder, unspecified: Secondary | ICD-10-CM | POA: Diagnosis not present

## 2017-04-17 DIAGNOSIS — G2581 Restless legs syndrome: Secondary | ICD-10-CM | POA: Diagnosis not present

## 2017-04-17 DIAGNOSIS — E291 Testicular hypofunction: Secondary | ICD-10-CM | POA: Diagnosis not present

## 2017-04-24 ENCOUNTER — Ambulatory Visit (HOSPITAL_COMMUNITY)
Admission: RE | Admit: 2017-04-24 | Discharge: 2017-04-24 | Disposition: A | Discharge status: Home / Self Care | Payer: Medicare Other | Source: Ambulatory Visit | Attending: Cardiology | Admitting: Cardiology

## 2017-04-24 ENCOUNTER — Telehealth: Payer: Self-pay | Admitting: *Deleted

## 2017-04-24 DIAGNOSIS — R609 Edema, unspecified: Secondary | ICD-10-CM | POA: Insufficient documentation

## 2017-04-24 NOTE — Telephone Encounter (Signed)
[facility] - [facility] state Marcine Matar put in order as Venous duplex, but Dr. Jacqualyn Posey had ordered Venous Reflux, please change the order pt is coming in today. Order changed as requested.

## 2017-04-28 ENCOUNTER — Telehealth: Payer: Self-pay | Admitting: *Deleted

## 2017-04-28 NOTE — Telephone Encounter (Signed)
-----   Message from Trula Slade, DPM sent at 04/25/2017  4:31 PM EST ----- Please let him know that the venous reflux study was normal and I recommend him to follow back up with his PCP. Could consider an MRI but without pain I do not think it is necessary at this time.

## 2017-04-28 NOTE — Telephone Encounter (Signed)
I informed pt of Dr. Leigh Aurora review of results and recommendations to be followed by PCP. Pt asked why he needed to see his PCP if the test was normal.  I told pt that although the test was normal, there could be other causes for the swelling and his PCP could evaluate.

## 2017-04-29 DIAGNOSIS — R062 Wheezing: Secondary | ICD-10-CM | POA: Diagnosis not present

## 2017-04-29 DIAGNOSIS — G4733 Obstructive sleep apnea (adult) (pediatric): Secondary | ICD-10-CM | POA: Diagnosis not present

## 2017-05-02 ENCOUNTER — Encounter: Payer: Self-pay | Admitting: Podiatry

## 2017-05-02 ENCOUNTER — Ambulatory Visit (INDEPENDENT_AMBULATORY_CARE_PROVIDER_SITE_OTHER): Payer: Medicare Other | Admitting: Podiatry

## 2017-05-02 DIAGNOSIS — M79674 Pain in right toe(s): Secondary | ICD-10-CM | POA: Diagnosis not present

## 2017-05-02 DIAGNOSIS — B351 Tinea unguium: Secondary | ICD-10-CM | POA: Diagnosis not present

## 2017-05-02 DIAGNOSIS — M79675 Pain in left toe(s): Secondary | ICD-10-CM

## 2017-05-02 DIAGNOSIS — M79676 Pain in unspecified toe(s): Secondary | ICD-10-CM | POA: Diagnosis not present

## 2017-05-02 DIAGNOSIS — E1149 Type 2 diabetes mellitus with other diabetic neurological complication: Secondary | ICD-10-CM | POA: Diagnosis not present

## 2017-05-04 NOTE — Progress Notes (Signed)
Subjective: 82 y.o. returns the office today for painful, elongated, thickened toenails which he cannot trim himself. Denies any redness or drainage around the nails.  His swelling resolved and he has no redness or warmth. He is asking if he can stop allopurinol.  Denies any acute changes since last appointment and no new complaints today. Denies any systemic complaints such as fevers, chills, nausea, vomiting.   PCP: Jefm Petty, MD  Objective: AAO 3, NAD DP/PT pulses palpable, CRT less than 3 seconds Protective sensation decreased with Simms Weinstein monofilament Nails hypertrophic, dystrophic, elongated, brittle, discolored 10. There is tenderness overlying the nails 1-5 bilaterally. There is no surrounding erythema or drainage along the nail sites. No open lesions or pre-ulcerative lesions are identified. There is no swelling to the left foot, ankle, leg and there is no erythema or increase in warmth. His symptoms have resolved.  No other areas of tenderness bilateral lower extremities. No overlying edema, erythema, increased warmth.  Assessment: Patient presents with symptomatic onychomycosis continue left lower extremity swelling;   Plan: -Treatment options including alternatives, risks, complications were discussed -Nails sharply debrided 10 without complication/bleeding. -Swelling has resolved. We discussed stopping allopurinol and he is going to do so. If he notices increase in swelling, or other symptoms then we may have to restart it.  -Follow-up as scheduled or sooner if any problems are to arise. In the meantime, encouraged to call the office with any questions, concerns, changes symptoms.  Celesta Gentile, DPM

## 2017-05-06 DIAGNOSIS — E291 Testicular hypofunction: Secondary | ICD-10-CM | POA: Diagnosis not present

## 2017-05-14 DIAGNOSIS — R197 Diarrhea, unspecified: Secondary | ICD-10-CM | POA: Diagnosis not present

## 2017-05-16 ENCOUNTER — Telehealth: Payer: Self-pay | Admitting: *Deleted

## 2017-05-16 DIAGNOSIS — K279 Peptic ulcer, site unspecified, unspecified as acute or chronic, without hemorrhage or perforation: Secondary | ICD-10-CM | POA: Insufficient documentation

## 2017-05-16 DIAGNOSIS — K219 Gastro-esophageal reflux disease without esophagitis: Secondary | ICD-10-CM | POA: Insufficient documentation

## 2017-05-16 MED ORDER — ALLOPURINOL 100 MG PO TABS: 100.0000 mg | ORAL_TABLET | Freq: Every day | ORAL | 0 refills | Status: DC

## 2017-05-16 NOTE — Telephone Encounter (Signed)
Refill request Allopurinol. Reviewed LOV 05/02/2017 and Dr. Jacqualyn Posey had stated if pt had reoccurrence of gout flare would restart allopurinol. Refilled as previously.

## 2017-05-20 ENCOUNTER — Telehealth: Payer: Self-pay | Admitting: Podiatry

## 2017-05-20 DIAGNOSIS — E291 Testicular hypofunction: Secondary | ICD-10-CM | POA: Diagnosis not present

## 2017-05-20 NOTE — Telephone Encounter (Signed)
I'm a pt of Dr. Leigh Aurora. I need to speak to someone concerning some medication that is my understanding he did not want me to take anymore. However, the pharmacy keeps calling saying they have the prescription ready for me to pick up. You can call me back at [phone]. Thank you.

## 2017-05-20 NOTE — Telephone Encounter (Signed)
I told pt that he was correct Dr. Jacqualyn Posey had said he could use the Allopurinol when he had a flare of gout. Pt states he will call [facility] and cancel. Pt states he uses the [facility] on [address].

## 2017-05-26 DIAGNOSIS — D125 Benign neoplasm of sigmoid colon: Secondary | ICD-10-CM | POA: Diagnosis not present

## 2017-05-26 DIAGNOSIS — K648 Other hemorrhoids: Secondary | ICD-10-CM | POA: Diagnosis not present

## 2017-05-26 DIAGNOSIS — D122 Benign neoplasm of ascending colon: Secondary | ICD-10-CM | POA: Diagnosis not present

## 2017-05-26 DIAGNOSIS — K6389 Other specified diseases of intestine: Secondary | ICD-10-CM | POA: Diagnosis not present

## 2017-05-26 DIAGNOSIS — D126 Benign neoplasm of colon, unspecified: Secondary | ICD-10-CM | POA: Diagnosis not present

## 2017-05-26 DIAGNOSIS — R197 Diarrhea, unspecified: Secondary | ICD-10-CM | POA: Diagnosis not present

## 2017-05-26 DIAGNOSIS — K621 Rectal polyp: Secondary | ICD-10-CM | POA: Diagnosis not present

## 2017-05-26 DIAGNOSIS — D124 Benign neoplasm of descending colon: Secondary | ICD-10-CM | POA: Diagnosis not present

## 2017-05-26 DIAGNOSIS — K5730 Diverticulosis of large intestine without perforation or abscess without bleeding: Secondary | ICD-10-CM | POA: Diagnosis not present

## 2017-05-26 DIAGNOSIS — D123 Benign neoplasm of transverse colon: Secondary | ICD-10-CM | POA: Diagnosis not present

## 2017-05-26 DIAGNOSIS — Z86010 Personal history of colonic polyps: Secondary | ICD-10-CM | POA: Diagnosis not present

## 2017-05-26 DIAGNOSIS — Z1211 Encounter for screening for malignant neoplasm of colon: Secondary | ICD-10-CM | POA: Diagnosis not present

## 2017-05-26 DIAGNOSIS — K635 Polyp of colon: Secondary | ICD-10-CM | POA: Diagnosis not present

## 2017-05-26 DIAGNOSIS — D128 Benign neoplasm of rectum: Secondary | ICD-10-CM | POA: Diagnosis not present

## 2017-05-26 DIAGNOSIS — D120 Benign neoplasm of cecum: Secondary | ICD-10-CM | POA: Diagnosis not present

## 2017-06-05 ENCOUNTER — Encounter: Payer: Self-pay | Admitting: Podiatry

## 2017-06-05 ENCOUNTER — Ambulatory Visit (INDEPENDENT_AMBULATORY_CARE_PROVIDER_SITE_OTHER): Payer: Medicare Other

## 2017-06-05 ENCOUNTER — Ambulatory Visit (INDEPENDENT_AMBULATORY_CARE_PROVIDER_SITE_OTHER): Payer: Medicare Other | Admitting: Podiatry

## 2017-06-05 DIAGNOSIS — B351 Tinea unguium: Secondary | ICD-10-CM | POA: Diagnosis not present

## 2017-06-05 DIAGNOSIS — E1149 Type 2 diabetes mellitus with other diabetic neurological complication: Secondary | ICD-10-CM

## 2017-06-05 DIAGNOSIS — M79674 Pain in right toe(s): Secondary | ICD-10-CM

## 2017-06-05 DIAGNOSIS — N390 Urinary tract infection, site not specified: Secondary | ICD-10-CM | POA: Diagnosis not present

## 2017-06-05 DIAGNOSIS — S92504A Nondisplaced unspecified fracture of right lesser toe(s), initial encounter for closed fracture: Secondary | ICD-10-CM

## 2017-06-05 DIAGNOSIS — M79675 Pain in left toe(s): Secondary | ICD-10-CM

## 2017-06-05 DIAGNOSIS — E291 Testicular hypofunction: Secondary | ICD-10-CM | POA: Diagnosis not present

## 2017-06-05 DIAGNOSIS — T148XXA Other injury of unspecified body region, initial encounter: Secondary | ICD-10-CM | POA: Diagnosis not present

## 2017-06-05 DIAGNOSIS — M79671 Pain in right foot: Secondary | ICD-10-CM

## 2017-06-05 DIAGNOSIS — R300 Dysuria: Secondary | ICD-10-CM | POA: Diagnosis not present

## 2017-06-09 DIAGNOSIS — I10 Essential (primary) hypertension: Secondary | ICD-10-CM | POA: Diagnosis not present

## 2017-06-09 DIAGNOSIS — R600 Localized edema: Secondary | ICD-10-CM | POA: Insufficient documentation

## 2017-06-09 DIAGNOSIS — N3941 Urge incontinence: Secondary | ICD-10-CM | POA: Diagnosis not present

## 2017-06-09 NOTE — Progress Notes (Signed)
Subjective: 82 y.o. returns the office today for painful, elongated, thickened toenails which he cannot trim himself . Denies any redness or drainage around the nails.  He also states that his toes are bruised on the right foot this is been ongoing for the last 3-4 days.  He states that his toes are sore when he tries to touch them.  He cannot recall an injury to his feet.  He said no recent treatment for this.  Denies any acute changes since last appointment and no new complaints today. Denies any systemic complaints such as fevers, chills, nausea, vomiting.   PCP: Jefm Petty, MD   Objective: AAO 3, NAD DP/PT pulses palpable, CRT less than 3 seconds; bilateral lower extremity pitting edema Protective sensation decreased with Simms Weinstein monofilament  Nails hypertrophic, dystrophic, elongated, brittle, discolored 10. There is tenderness overlying the nails 1-5 bilaterally. There is no surrounding erythema or drainage along the nail sites. There is bruising to the digits on the right foot mostly along the third and fourth toes.  There is mild tenderness to palpation to the digits.  There is no pain in the metatarsals or other areas of the foot. No open lesions or pre-ulcerative lesions are identified. No other areas of tenderness bilateral lower extremities. No overlying edema, erythema, increased warmth. No pain with calf compression, swelling, warmth, erythema.  Assessment: Patient presents with symptomatic onychomycosis; possible toe fracture  Plan: -Treatment options including alternatives, risks, complications were discussed -Nails sharply debrided 10 without complication/bleeding. -X-rays were obtained and reviewed.  There is a questionable radiolucency of the fourth toe.  Given the pain to the toes as well as the bruising I want him to be immobilized in the surgical shoe which he already has at home.  Elevation.  Also recommended to follow-up with his primary care physician  due to the pitting edema bilaterally. -Discussed daily foot inspection. If there are any changes, to call the office immediately.  -Follow-up in 2 weeks or sooner if any problems are to arise. In the meantime, encouraged to call the office with any questions, concerns, changes symptoms.  Celesta Gentile, DPM

## 2017-06-13 ENCOUNTER — Ambulatory Visit: Payer: Medicare Other | Admitting: Podiatry

## 2017-06-18 DIAGNOSIS — E291 Testicular hypofunction: Secondary | ICD-10-CM | POA: Diagnosis not present

## 2017-06-19 ENCOUNTER — Telehealth: Payer: Self-pay | Admitting: Podiatry

## 2017-06-19 ENCOUNTER — Ambulatory Visit: Payer: Medicare Other | Admitting: Podiatry

## 2017-06-19 NOTE — Telephone Encounter (Signed)
I saw Dr. Jacqualyn Posey the other week. He diagnosed me with a slight stress fracture in my foot. He suggested I wear that boot but I just can't, it's very uncomfortable and actually my foot feels worse when I take it off and put it on. My son had a similar problem and his doctor gave him a metal insert sole to put in his shoe and I'd like to find out if I can get something like that through Dr. Jacqualyn Posey or he could tell me where I can go get it as I want to try that. My phone number is [phone]. Thank you.

## 2017-06-19 NOTE — Telephone Encounter (Signed)
Can do a graphite insert.

## 2017-06-20 NOTE — Telephone Encounter (Signed)
I informed pt of Dr. Jacqualyn Posey recommendation of the graphite insert. Pt asked how long before he would heal. I told pt that it often took 4-6 weeks for a bone callous to be seen on x-ray, which was a stage of bone healing. Pt states he will come get the graphite insert.

## 2017-06-23 DIAGNOSIS — I712 Thoracic aortic aneurysm, without rupture: Secondary | ICD-10-CM | POA: Diagnosis not present

## 2017-06-26 DIAGNOSIS — N509 Disorder of male genital organs, unspecified: Secondary | ICD-10-CM | POA: Diagnosis not present

## 2017-06-27 DIAGNOSIS — K5900 Constipation, unspecified: Secondary | ICD-10-CM | POA: Diagnosis not present

## 2017-06-30 DIAGNOSIS — I712 Thoracic aortic aneurysm, without rupture: Secondary | ICD-10-CM | POA: Diagnosis not present

## 2017-07-02 DIAGNOSIS — E291 Testicular hypofunction: Secondary | ICD-10-CM | POA: Diagnosis not present

## 2017-07-14 DIAGNOSIS — E291 Testicular hypofunction: Secondary | ICD-10-CM | POA: Diagnosis not present

## 2017-07-14 DIAGNOSIS — G4701 Insomnia due to medical condition: Secondary | ICD-10-CM | POA: Diagnosis not present

## 2017-07-14 DIAGNOSIS — E782 Mixed hyperlipidemia: Secondary | ICD-10-CM | POA: Diagnosis not present

## 2017-07-14 DIAGNOSIS — Z9989 Dependence on other enabling machines and devices: Secondary | ICD-10-CM | POA: Diagnosis not present

## 2017-07-14 DIAGNOSIS — Z880 Allergy status to penicillin: Secondary | ICD-10-CM | POA: Diagnosis not present

## 2017-07-14 DIAGNOSIS — E1159 Type 2 diabetes mellitus with other circulatory complications: Secondary | ICD-10-CM | POA: Diagnosis not present

## 2017-07-14 DIAGNOSIS — I2510 Atherosclerotic heart disease of native coronary artery without angina pectoris: Secondary | ICD-10-CM | POA: Diagnosis not present

## 2017-07-14 DIAGNOSIS — I10 Essential (primary) hypertension: Secondary | ICD-10-CM | POA: Diagnosis not present

## 2017-07-14 DIAGNOSIS — Z79899 Other long term (current) drug therapy: Secondary | ICD-10-CM | POA: Diagnosis not present

## 2017-07-14 DIAGNOSIS — Z6832 Body mass index (BMI) 32.0-32.9, adult: Secondary | ICD-10-CM | POA: Diagnosis not present

## 2017-07-14 DIAGNOSIS — G2581 Restless legs syndrome: Secondary | ICD-10-CM | POA: Diagnosis not present

## 2017-07-14 DIAGNOSIS — Z955 Presence of coronary angioplasty implant and graft: Secondary | ICD-10-CM | POA: Diagnosis not present

## 2017-07-14 DIAGNOSIS — Z888 Allergy status to other drugs, medicaments and biological substances status: Secondary | ICD-10-CM | POA: Diagnosis not present

## 2017-07-14 DIAGNOSIS — G4733 Obstructive sleep apnea (adult) (pediatric): Secondary | ICD-10-CM | POA: Diagnosis not present

## 2017-07-14 DIAGNOSIS — Z794 Long term (current) use of insulin: Secondary | ICD-10-CM | POA: Diagnosis not present

## 2017-07-14 DIAGNOSIS — E119 Type 2 diabetes mellitus without complications: Secondary | ICD-10-CM | POA: Diagnosis not present

## 2017-07-17 DIAGNOSIS — E291 Testicular hypofunction: Secondary | ICD-10-CM | POA: Diagnosis not present

## 2017-07-22 DIAGNOSIS — F410 Panic disorder [episodic paroxysmal anxiety] without agoraphobia: Secondary | ICD-10-CM | POA: Diagnosis not present

## 2017-07-28 DIAGNOSIS — H25813 Combined forms of age-related cataract, bilateral: Secondary | ICD-10-CM | POA: Diagnosis not present

## 2017-07-28 DIAGNOSIS — H5203 Hypermetropia, bilateral: Secondary | ICD-10-CM | POA: Diagnosis not present

## 2017-07-28 DIAGNOSIS — H43822 Vitreomacular adhesion, left eye: Secondary | ICD-10-CM | POA: Diagnosis not present

## 2017-07-28 DIAGNOSIS — E119 Type 2 diabetes mellitus without complications: Secondary | ICD-10-CM | POA: Diagnosis not present

## 2017-07-31 ENCOUNTER — Encounter: Payer: Self-pay | Admitting: Podiatry

## 2017-07-31 ENCOUNTER — Ambulatory Visit (INDEPENDENT_AMBULATORY_CARE_PROVIDER_SITE_OTHER): Payer: Medicare Other | Admitting: Podiatry

## 2017-07-31 DIAGNOSIS — M109 Gout, unspecified: Secondary | ICD-10-CM | POA: Diagnosis not present

## 2017-07-31 DIAGNOSIS — M79675 Pain in left toe(s): Secondary | ICD-10-CM

## 2017-07-31 DIAGNOSIS — M79674 Pain in right toe(s): Secondary | ICD-10-CM | POA: Diagnosis not present

## 2017-07-31 DIAGNOSIS — M779 Enthesopathy, unspecified: Secondary | ICD-10-CM

## 2017-07-31 DIAGNOSIS — M79676 Pain in unspecified toe(s): Secondary | ICD-10-CM

## 2017-07-31 DIAGNOSIS — B351 Tinea unguium: Secondary | ICD-10-CM | POA: Diagnosis not present

## 2017-07-31 MED ORDER — COLCHICINE 0.6 MG PO TABS: 0.6000 mg | ORAL_TABLET | Freq: Every day | ORAL | 0 refills | Status: DC

## 2017-07-31 NOTE — Progress Notes (Signed)
Subjective: 82 year old male presents  to the office today for concerns of  painful, elongated toenails that he cannot trim himself.  He also has other concerns as his left foot is been hurting.  He states that it started hurting abruptly at 3 AM yesterday morning.  His wife did give him some colchicine that they had leftover from previous and his pain is much improved today however he does continue.  No recent injury.  He says the swelling has improved mildly although that continues as well.  Pain is improved.  No other concerns. Denies any systemic complaints such as fevers, chills, nausea, vomiting. No acute changes since last appointment, and no other complaints at this time.   Objective: AAO x3, NAD DP/PT pulses palpable bilaterally, CRT less than 3 seconds There is moderate swelling to left foot.  There is mild tenderness along the medial first MPJ on the left foot.  Very minimal redness and warmth of the area.  There is no other area tenderness identified.  There is no open lesions or pre-ulcerative lesions.  Is no drainage or any other signs of infection.   Nails are hypertrophic, dystrophic, brittle, discolored, elongated 10. No surrounding redness or drainage. Tenderness nails 1-5 bilaterally.   No pain with calf compression, swelling, warmth, erythema  Assessment: Capsulitis, gout left foot; symptomatic onychomycosis  Plan: -All treatment options discussed with the patient including all alternatives, risks, complications.  -Regards to the left foot apparently this is a recurrent gout flare.  I did order colchicine once a day for him.  He declines steroid injection today.  Also given recurrence of gout I will refer to rheumatology.  For follow-up. -Nails debrided x10 without any complications or bleeding.  If no improvement over the next couple of days to call the office -RTC 9 weeks for routine care or sooner if needed. -Patient encouraged to call the office with any questions,  concerns, change in symptoms.   Trula Slade DPM

## 2017-08-01 ENCOUNTER — Ambulatory Visit: Payer: Medicare Other | Admitting: Podiatry

## 2017-08-01 ENCOUNTER — Telehealth: Payer: Self-pay | Admitting: *Deleted

## 2017-08-01 DIAGNOSIS — E291 Testicular hypofunction: Secondary | ICD-10-CM | POA: Diagnosis not present

## 2017-08-01 DIAGNOSIS — M79674 Pain in right toe(s): Secondary | ICD-10-CM

## 2017-08-01 DIAGNOSIS — M109 Gout, unspecified: Secondary | ICD-10-CM

## 2017-08-01 DIAGNOSIS — M779 Enthesopathy, unspecified: Secondary | ICD-10-CM

## 2017-08-01 DIAGNOSIS — M79675 Pain in left toe(s): Secondary | ICD-10-CM

## 2017-08-01 NOTE — Telephone Encounter (Signed)
Faxed required form, clinicals and demographics to [facility]. 

## 2017-08-01 NOTE — Telephone Encounter (Signed)
-----   Message from Trula Slade, DPM sent at 07/31/2017 12:30 PM EDT ----- Can you please refer to rheumatology for gout. Can be with Dr. Kathlene November.

## 2017-08-15 DIAGNOSIS — E291 Testicular hypofunction: Secondary | ICD-10-CM | POA: Diagnosis not present

## 2017-08-18 DIAGNOSIS — E1142 Type 2 diabetes mellitus with diabetic polyneuropathy: Secondary | ICD-10-CM | POA: Diagnosis not present

## 2017-08-18 DIAGNOSIS — G2581 Restless legs syndrome: Secondary | ICD-10-CM | POA: Diagnosis not present

## 2017-08-18 DIAGNOSIS — G20 Parkinson's disease: Secondary | ICD-10-CM | POA: Diagnosis not present

## 2017-08-26 DIAGNOSIS — E291 Testicular hypofunction: Secondary | ICD-10-CM | POA: Diagnosis not present

## 2017-09-10 DIAGNOSIS — E291 Testicular hypofunction: Secondary | ICD-10-CM | POA: Diagnosis not present

## 2017-09-10 DIAGNOSIS — N400 Enlarged prostate without lower urinary tract symptoms: Secondary | ICD-10-CM | POA: Diagnosis not present

## 2017-09-17 DIAGNOSIS — H35372 Puckering of macula, left eye: Secondary | ICD-10-CM | POA: Diagnosis not present

## 2017-09-17 DIAGNOSIS — H43823 Vitreomacular adhesion, bilateral: Secondary | ICD-10-CM | POA: Diagnosis not present

## 2017-09-17 DIAGNOSIS — H43812 Vitreous degeneration, left eye: Secondary | ICD-10-CM | POA: Diagnosis not present

## 2017-09-19 DIAGNOSIS — D1801 Hemangioma of skin and subcutaneous tissue: Secondary | ICD-10-CM | POA: Diagnosis not present

## 2017-09-19 DIAGNOSIS — D225 Melanocytic nevi of trunk: Secondary | ICD-10-CM | POA: Diagnosis not present

## 2017-09-19 DIAGNOSIS — L821 Other seborrheic keratosis: Secondary | ICD-10-CM | POA: Diagnosis not present

## 2017-09-19 DIAGNOSIS — L570 Actinic keratosis: Secondary | ICD-10-CM | POA: Diagnosis not present

## 2017-09-19 DIAGNOSIS — Z85828 Personal history of other malignant neoplasm of skin: Secondary | ICD-10-CM | POA: Diagnosis not present

## 2017-09-19 DIAGNOSIS — L814 Other melanin hyperpigmentation: Secondary | ICD-10-CM | POA: Diagnosis not present

## 2017-09-19 DIAGNOSIS — D485 Neoplasm of uncertain behavior of skin: Secondary | ICD-10-CM | POA: Diagnosis not present

## 2017-09-23 ENCOUNTER — Other Ambulatory Visit: Payer: Self-pay

## 2017-09-23 ENCOUNTER — Ambulatory Visit: Payer: Medicare Other | Admitting: Podiatry

## 2017-09-23 DIAGNOSIS — M79674 Pain in right toe(s): Secondary | ICD-10-CM

## 2017-09-23 DIAGNOSIS — M79675 Pain in left toe(s): Secondary | ICD-10-CM

## 2017-09-23 DIAGNOSIS — B351 Tinea unguium: Secondary | ICD-10-CM

## 2017-09-23 DIAGNOSIS — E1149 Type 2 diabetes mellitus with other diabetic neurological complication: Secondary | ICD-10-CM

## 2017-09-23 DIAGNOSIS — L84 Corns and callosities: Secondary | ICD-10-CM

## 2017-09-24 DIAGNOSIS — E291 Testicular hypofunction: Secondary | ICD-10-CM | POA: Diagnosis not present

## 2017-09-29 NOTE — Progress Notes (Signed)
Subjective: 82 y.o. returns the office today for painful, elongated, thickened toenails which he cannot trim himself. Denies any redness or drainage around the nails.  He has not had any recurrence of gout since I last saw him.  Denies any acute changes since last appointment and no new complaints today. Denies any systemic complaints such as fevers, chills, nausea, vomiting.   PCP: Jefm Petty, MD  Objective: AAO 3, NAD DP/PT pulses palpable, CRT less than 3 seconds Nails hypertrophic, dystrophic, elongated, brittle, discolored x 10.. There is tenderness overlying the nails 1-5 bilaterally. There is no surrounding erythema or drainage along the nail sites. Small hyperkeratotic lesion submetatarsal right foot.  No underlying ulceration drainage or signs of infection. No open lesions or pre-ulcerative lesions are identified. No other areas of tenderness bilateral lower extremities. No overlying edema, erythema, increased warmth. No pain with calf compression, swelling, warmth, erythema.  Assessment: Patient presents with symptomatic onychomycosis; hyperkeratotic lesion  Plan: -Treatment options including alternatives, risks, complications were discussed -Nails sharply debrided 10 without complication/bleeding. -Hyperkeratotic lesion sharply debrided x1 without any complications or bleeding -Discussed daily foot inspection. If there are any changes, to call the office immediately.  -Follow-up in 4 weeks at his request.  Or sooner if any problems are to arise. In the meantime, encouraged to call the office with any questions, concerns, changes symptoms.  *He is aware this visit is not covered by insurance an ABN was signed.   Celesta Gentile, DPM

## 2017-09-30 ENCOUNTER — Ambulatory Visit: Payer: Medicare Other | Admitting: Podiatry

## 2017-10-01 DIAGNOSIS — Z6831 Body mass index (BMI) 31.0-31.9, adult: Secondary | ICD-10-CM | POA: Diagnosis not present

## 2017-10-01 DIAGNOSIS — M3500 Sicca syndrome, unspecified: Secondary | ICD-10-CM | POA: Diagnosis not present

## 2017-10-01 DIAGNOSIS — M1009 Idiopathic gout, multiple sites: Secondary | ICD-10-CM | POA: Diagnosis not present

## 2017-10-01 DIAGNOSIS — M150 Primary generalized (osteo)arthritis: Secondary | ICD-10-CM | POA: Diagnosis not present

## 2017-10-01 DIAGNOSIS — R5383 Other fatigue: Secondary | ICD-10-CM | POA: Diagnosis not present

## 2017-10-01 DIAGNOSIS — M2550 Pain in unspecified joint: Secondary | ICD-10-CM | POA: Diagnosis not present

## 2017-10-01 DIAGNOSIS — E669 Obesity, unspecified: Secondary | ICD-10-CM | POA: Diagnosis not present

## 2017-10-08 DIAGNOSIS — E291 Testicular hypofunction: Secondary | ICD-10-CM | POA: Diagnosis not present

## 2017-10-20 DIAGNOSIS — E1169 Type 2 diabetes mellitus with other specified complication: Secondary | ICD-10-CM | POA: Diagnosis not present

## 2017-10-20 DIAGNOSIS — E1159 Type 2 diabetes mellitus with other circulatory complications: Secondary | ICD-10-CM | POA: Diagnosis not present

## 2017-10-20 DIAGNOSIS — I10 Essential (primary) hypertension: Secondary | ICD-10-CM | POA: Diagnosis not present

## 2017-10-20 DIAGNOSIS — Z794 Long term (current) use of insulin: Secondary | ICD-10-CM | POA: Diagnosis not present

## 2017-10-20 DIAGNOSIS — E782 Mixed hyperlipidemia: Secondary | ICD-10-CM | POA: Diagnosis not present

## 2017-10-20 DIAGNOSIS — E119 Type 2 diabetes mellitus without complications: Secondary | ICD-10-CM | POA: Diagnosis not present

## 2017-10-20 DIAGNOSIS — Z6832 Body mass index (BMI) 32.0-32.9, adult: Secondary | ICD-10-CM | POA: Diagnosis not present

## 2017-10-23 DIAGNOSIS — E291 Testicular hypofunction: Secondary | ICD-10-CM | POA: Diagnosis not present

## 2017-10-28 DIAGNOSIS — N3289 Other specified disorders of bladder: Secondary | ICD-10-CM | POA: Diagnosis not present

## 2017-10-28 DIAGNOSIS — R310 Gross hematuria: Secondary | ICD-10-CM | POA: Diagnosis not present

## 2017-10-28 DIAGNOSIS — N529 Male erectile dysfunction, unspecified: Secondary | ICD-10-CM | POA: Diagnosis not present

## 2017-10-28 DIAGNOSIS — N400 Enlarged prostate without lower urinary tract symptoms: Secondary | ICD-10-CM | POA: Diagnosis not present

## 2017-10-28 DIAGNOSIS — E291 Testicular hypofunction: Secondary | ICD-10-CM | POA: Diagnosis not present

## 2017-10-29 DIAGNOSIS — G4733 Obstructive sleep apnea (adult) (pediatric): Secondary | ICD-10-CM | POA: Diagnosis not present

## 2017-10-29 DIAGNOSIS — J309 Allergic rhinitis, unspecified: Secondary | ICD-10-CM | POA: Diagnosis not present

## 2017-10-30 ENCOUNTER — Ambulatory Visit: Payer: Medicare Other | Admitting: Podiatry

## 2017-11-05 DIAGNOSIS — R197 Diarrhea, unspecified: Secondary | ICD-10-CM | POA: Diagnosis not present

## 2017-11-06 ENCOUNTER — Encounter: Payer: Self-pay | Admitting: Podiatry

## 2017-11-06 ENCOUNTER — Ambulatory Visit (INDEPENDENT_AMBULATORY_CARE_PROVIDER_SITE_OTHER): Payer: Medicare Other | Admitting: Podiatry

## 2017-11-06 DIAGNOSIS — M79675 Pain in left toe(s): Secondary | ICD-10-CM

## 2017-11-06 DIAGNOSIS — M79674 Pain in right toe(s): Secondary | ICD-10-CM

## 2017-11-06 DIAGNOSIS — E669 Obesity, unspecified: Secondary | ICD-10-CM | POA: Diagnosis not present

## 2017-11-06 DIAGNOSIS — B351 Tinea unguium: Secondary | ICD-10-CM

## 2017-11-06 DIAGNOSIS — L84 Corns and callosities: Secondary | ICD-10-CM

## 2017-11-06 DIAGNOSIS — M3500 Sicca syndrome, unspecified: Secondary | ICD-10-CM | POA: Diagnosis not present

## 2017-11-06 DIAGNOSIS — E1149 Type 2 diabetes mellitus with other diabetic neurological complication: Secondary | ICD-10-CM

## 2017-11-06 DIAGNOSIS — M1009 Idiopathic gout, multiple sites: Secondary | ICD-10-CM | POA: Diagnosis not present

## 2017-11-06 DIAGNOSIS — M2550 Pain in unspecified joint: Secondary | ICD-10-CM | POA: Diagnosis not present

## 2017-11-06 DIAGNOSIS — Z79899 Other long term (current) drug therapy: Secondary | ICD-10-CM | POA: Diagnosis not present

## 2017-11-06 DIAGNOSIS — Z6831 Body mass index (BMI) 31.0-31.9, adult: Secondary | ICD-10-CM | POA: Diagnosis not present

## 2017-11-06 DIAGNOSIS — M150 Primary generalized (osteo)arthritis: Secondary | ICD-10-CM | POA: Diagnosis not present

## 2017-11-07 NOTE — Progress Notes (Signed)
Subjective: 82 y.o. returns the office today for painful, elongated, thickened toenails which he cannot trim himself. Denies any redness or drainage around the nails.  He has not had any recurrence of gout since I last saw him.  Denies any acute changes since last appointment and no new complaints today. Denies any systemic complaints such as fevers, chills, nausea, vomiting.   PCP: Jefm Petty, MD  Objective: AAO 3, NAD DP/PT pulses palpable, CRT less than 3 seconds Nails hypertrophic, dystrophic, elongated, brittle, discolored x 10.. There is tenderness overlying the nails 1-5 bilaterally. There is no surrounding erythema or drainage along the nail sites. Small hyperkeratotic lesion submetatarsal right foot.  No underlying ulceration drainage or signs of infection. No open lesions or pre-ulcerative lesions are identified. No other areas of tenderness bilateral lower extremities. No overlying edema, erythema, increased warmth. No pain with calf compression, swelling, warmth, erythema.  Assessment: Patient presents with symptomatic onychomycosis; hyperkeratotic lesion  Plan: -Treatment options including alternatives, risks, complications were discussed -Nails sharply debrided 10 without complication/bleeding. -Hyperkeratotic lesion sharply debrided x1 without any complications or bleeding -Discussed daily foot inspection. If there are any changes, to call the office immediately.  -Follow-up in 4 weeks at his request.  Or sooner if any problems are to arise. In the meantime, encouraged to call the office with any questions, concerns, changes symptoms.  *He is aware this visit is not covered by insurance an ABN was signed.   Celesta Gentile, DPM

## 2017-11-11 DIAGNOSIS — R300 Dysuria: Secondary | ICD-10-CM | POA: Diagnosis not present

## 2017-11-11 DIAGNOSIS — E291 Testicular hypofunction: Secondary | ICD-10-CM | POA: Diagnosis not present

## 2017-11-11 DIAGNOSIS — N390 Urinary tract infection, site not specified: Secondary | ICD-10-CM | POA: Diagnosis not present

## 2017-11-19 DIAGNOSIS — E291 Testicular hypofunction: Secondary | ICD-10-CM | POA: Diagnosis not present

## 2017-11-19 DIAGNOSIS — R399 Unspecified symptoms and signs involving the genitourinary system: Secondary | ICD-10-CM | POA: Diagnosis not present

## 2017-11-19 DIAGNOSIS — N400 Enlarged prostate without lower urinary tract symptoms: Secondary | ICD-10-CM | POA: Diagnosis not present

## 2017-11-28 DIAGNOSIS — N401 Enlarged prostate with lower urinary tract symptoms: Secondary | ICD-10-CM | POA: Diagnosis not present

## 2017-11-28 DIAGNOSIS — N138 Other obstructive and reflux uropathy: Secondary | ICD-10-CM | POA: Diagnosis not present

## 2017-11-28 DIAGNOSIS — N200 Calculus of kidney: Secondary | ICD-10-CM | POA: Diagnosis not present

## 2017-11-28 DIAGNOSIS — N281 Cyst of kidney, acquired: Secondary | ICD-10-CM | POA: Diagnosis not present

## 2017-11-28 DIAGNOSIS — E290 Testicular hyperfunction: Secondary | ICD-10-CM | POA: Diagnosis not present

## 2017-11-28 DIAGNOSIS — K760 Fatty (change of) liver, not elsewhere classified: Secondary | ICD-10-CM | POA: Diagnosis not present

## 2017-11-28 DIAGNOSIS — R319 Hematuria, unspecified: Secondary | ICD-10-CM | POA: Diagnosis not present

## 2017-12-01 DIAGNOSIS — M1009 Idiopathic gout, multiple sites: Secondary | ICD-10-CM | POA: Diagnosis not present

## 2017-12-02 DIAGNOSIS — R197 Diarrhea, unspecified: Secondary | ICD-10-CM | POA: Diagnosis not present

## 2017-12-03 DIAGNOSIS — E1121 Type 2 diabetes mellitus with diabetic nephropathy: Secondary | ICD-10-CM | POA: Diagnosis not present

## 2017-12-03 DIAGNOSIS — I2510 Atherosclerotic heart disease of native coronary artery without angina pectoris: Secondary | ICD-10-CM | POA: Diagnosis not present

## 2017-12-03 DIAGNOSIS — R5383 Other fatigue: Secondary | ICD-10-CM | POA: Diagnosis not present

## 2017-12-03 DIAGNOSIS — G2581 Restless legs syndrome: Secondary | ICD-10-CM | POA: Diagnosis not present

## 2017-12-03 DIAGNOSIS — E7800 Pure hypercholesterolemia, unspecified: Secondary | ICD-10-CM | POA: Diagnosis not present

## 2017-12-03 DIAGNOSIS — R269 Unspecified abnormalities of gait and mobility: Secondary | ICD-10-CM | POA: Diagnosis not present

## 2017-12-03 DIAGNOSIS — R197 Diarrhea, unspecified: Secondary | ICD-10-CM | POA: Diagnosis not present

## 2017-12-03 DIAGNOSIS — N183 Chronic kidney disease, stage 3 (moderate): Secondary | ICD-10-CM | POA: Diagnosis not present

## 2017-12-03 DIAGNOSIS — R259 Unspecified abnormal involuntary movements: Secondary | ICD-10-CM | POA: Diagnosis not present

## 2017-12-03 DIAGNOSIS — I129 Hypertensive chronic kidney disease with stage 1 through stage 4 chronic kidney disease, or unspecified chronic kidney disease: Secondary | ICD-10-CM | POA: Diagnosis not present

## 2017-12-03 DIAGNOSIS — I10 Essential (primary) hypertension: Secondary | ICD-10-CM | POA: Diagnosis not present

## 2017-12-09 ENCOUNTER — Encounter: Payer: Self-pay | Admitting: Podiatry

## 2017-12-09 ENCOUNTER — Ambulatory Visit (INDEPENDENT_AMBULATORY_CARE_PROVIDER_SITE_OTHER): Payer: Medicare Other | Admitting: Podiatry

## 2017-12-09 DIAGNOSIS — M79674 Pain in right toe(s): Secondary | ICD-10-CM

## 2017-12-09 DIAGNOSIS — E1149 Type 2 diabetes mellitus with other diabetic neurological complication: Secondary | ICD-10-CM | POA: Diagnosis not present

## 2017-12-09 DIAGNOSIS — Q828 Other specified congenital malformations of skin: Secondary | ICD-10-CM

## 2017-12-09 DIAGNOSIS — M79675 Pain in left toe(s): Secondary | ICD-10-CM | POA: Diagnosis not present

## 2017-12-09 DIAGNOSIS — B351 Tinea unguium: Secondary | ICD-10-CM

## 2017-12-13 NOTE — Progress Notes (Signed)
Subjective: 82 y.o. returns the office today for painful, elongated, thickened toenails which he cannot trim himself. Denies any redness or drainage around the nails.  Denies any systemic complaints such as fevers, chills, nausea, vomiting.   PCP: Jefm Petty, MD  Objective: AAO 3, NAD DP/PT pulses palpable, CRT less than 3 seconds Nails hypertrophic, dystrophic, elongated, brittle, discolored x 10.. There is tenderness overlying the nails 1-5 bilaterally. There is no surrounding erythema or drainage along the nail sites. Small hyperkeratotic lesion submetatarsal right foot.  No underlying ulceration drainage or signs of infection. No open lesions or pre-ulcerative lesions are identified. No other areas of tenderness bilateral lower extremities. No overlying edema, erythema, increased warmth. No pain with calf compression, swelling, warmth, erythema.  Assessment: Patient presents with symptomatic onychomycosis; hyperkeratotic lesion  Plan: -Treatment options including alternatives, risks, complications were discussed -Nails sharply debrided 10 without complication/bleeding. -Hyperkeratotic lesion sharply debrided x1 without any complications or bleeding -Discussed daily foot inspection. If there are any changes, to call the office immediately.  -Follow-up in 4 weeks at his request.  Or sooner if any problems are to arise. In the meantime, encouraged to call the office with any questions, concerns, changes symptoms.  Celesta Gentile, DPM

## 2017-12-18 DIAGNOSIS — E291 Testicular hypofunction: Secondary | ICD-10-CM | POA: Diagnosis not present

## 2017-12-29 DIAGNOSIS — M19012 Primary osteoarthritis, left shoulder: Secondary | ICD-10-CM | POA: Diagnosis not present

## 2018-01-01 DIAGNOSIS — E291 Testicular hypofunction: Secondary | ICD-10-CM | POA: Diagnosis not present

## 2018-01-06 ENCOUNTER — Ambulatory Visit: Payer: Medicare Other | Admitting: Podiatry

## 2018-01-06 DIAGNOSIS — B351 Tinea unguium: Secondary | ICD-10-CM

## 2018-01-06 DIAGNOSIS — E1149 Type 2 diabetes mellitus with other diabetic neurological complication: Secondary | ICD-10-CM

## 2018-01-06 DIAGNOSIS — M79675 Pain in left toe(s): Secondary | ICD-10-CM

## 2018-01-06 DIAGNOSIS — M79674 Pain in right toe(s): Secondary | ICD-10-CM

## 2018-01-06 DIAGNOSIS — Q828 Other specified congenital malformations of skin: Secondary | ICD-10-CM

## 2018-01-06 NOTE — Progress Notes (Signed)
Subjective: 82 y.o. returns the office today for painful, elongated, thickened toenails which he cannot trim himself. He did change shoes and the callus has been getting a little worse on the left foot. He also states that his big toenails are becoming. Denies any redness or drainage around the nails.  Denies any systemic complaints such as fevers, chills, nausea, vomiting.   PCP: Jefm Petty, MD  Objective: AAO 3, NAD DP/PT pulses palpable, CRT less than 3 seconds Nails hypertrophic, dystrophic, elongated, brittle, discolored x 10.. There is tenderness overlying the nails 1-5 bilaterally. There is no surrounding erythema or drainage along the nail sites. Small hyperkeratotic lesion submetatarsal right foot submet 1 and right medial hallux.  No underlying ulceration drainage or signs of infection. No open lesions or pre-ulcerative lesions are identified. No other areas of tenderness bilateral lower extremities. No overlying edema, erythema, increased warmth. No pain with calf compression, swelling, warmth, erythema.  Assessment: Patient presents with symptomatic onychomycosis; hyperkeratotic lesion  Plan: -Treatment options including alternatives, risks, complications were discussed -Nails sharply debrided 10 without complication/bleeding. -Hyperkeratotic lesion sharply debrided x1 without any complications or bleeding -Discussed daily foot inspection. If there are any changes, to call the office immediately.  -Follow-up in 4 weeks at his request.  Or sooner if any problems are to arise. In the meantime, encouraged to call the office with any questions, concerns, changes symptoms.  Celesta Gentile, DPM

## 2018-01-20 DIAGNOSIS — E291 Testicular hypofunction: Secondary | ICD-10-CM | POA: Diagnosis not present

## 2018-01-21 DIAGNOSIS — F410 Panic disorder [episodic paroxysmal anxiety] without agoraphobia: Secondary | ICD-10-CM | POA: Diagnosis not present

## 2018-01-22 DIAGNOSIS — N138 Other obstructive and reflux uropathy: Secondary | ICD-10-CM | POA: Diagnosis not present

## 2018-01-22 DIAGNOSIS — R319 Hematuria, unspecified: Secondary | ICD-10-CM | POA: Diagnosis not present

## 2018-01-22 DIAGNOSIS — N401 Enlarged prostate with lower urinary tract symptoms: Secondary | ICD-10-CM | POA: Diagnosis not present

## 2018-01-22 DIAGNOSIS — R3912 Poor urinary stream: Secondary | ICD-10-CM | POA: Diagnosis not present

## 2018-01-22 DIAGNOSIS — R351 Nocturia: Secondary | ICD-10-CM | POA: Diagnosis not present

## 2018-01-22 DIAGNOSIS — R3915 Urgency of urination: Secondary | ICD-10-CM | POA: Diagnosis not present

## 2018-01-22 DIAGNOSIS — N400 Enlarged prostate without lower urinary tract symptoms: Secondary | ICD-10-CM | POA: Diagnosis not present

## 2018-01-22 DIAGNOSIS — N200 Calculus of kidney: Secondary | ICD-10-CM | POA: Diagnosis not present

## 2018-01-23 DIAGNOSIS — E1159 Type 2 diabetes mellitus with other circulatory complications: Secondary | ICD-10-CM | POA: Diagnosis not present

## 2018-01-23 DIAGNOSIS — Z6831 Body mass index (BMI) 31.0-31.9, adult: Secondary | ICD-10-CM | POA: Diagnosis not present

## 2018-01-23 DIAGNOSIS — Z794 Long term (current) use of insulin: Secondary | ICD-10-CM | POA: Diagnosis not present

## 2018-01-25 DIAGNOSIS — R319 Hematuria, unspecified: Secondary | ICD-10-CM | POA: Diagnosis not present

## 2018-01-25 DIAGNOSIS — N401 Enlarged prostate with lower urinary tract symptoms: Secondary | ICD-10-CM | POA: Diagnosis not present

## 2018-01-25 DIAGNOSIS — N138 Other obstructive and reflux uropathy: Secondary | ICD-10-CM | POA: Diagnosis not present

## 2018-01-25 DIAGNOSIS — R339 Retention of urine, unspecified: Secondary | ICD-10-CM | POA: Diagnosis not present

## 2018-01-25 DIAGNOSIS — K5900 Constipation, unspecified: Secondary | ICD-10-CM | POA: Diagnosis not present

## 2018-01-25 DIAGNOSIS — R1030 Lower abdominal pain, unspecified: Secondary | ICD-10-CM | POA: Diagnosis not present

## 2018-01-30 ENCOUNTER — Ambulatory Visit: Payer: Medicare Other | Admitting: Podiatry

## 2018-02-03 ENCOUNTER — Ambulatory Visit: Payer: Medicare Other | Admitting: Podiatry

## 2018-02-04 DIAGNOSIS — R000 Tachycardia, unspecified: Secondary | ICD-10-CM | POA: Diagnosis not present

## 2018-02-06 DIAGNOSIS — N401 Enlarged prostate with lower urinary tract symptoms: Secondary | ICD-10-CM | POA: Diagnosis present

## 2018-02-06 DIAGNOSIS — Z955 Presence of coronary angioplasty implant and graft: Secondary | ICD-10-CM | POA: Diagnosis not present

## 2018-02-06 DIAGNOSIS — N138 Other obstructive and reflux uropathy: Secondary | ICD-10-CM | POA: Diagnosis present

## 2018-02-06 DIAGNOSIS — I440 Atrioventricular block, first degree: Secondary | ICD-10-CM | POA: Diagnosis not present

## 2018-02-06 DIAGNOSIS — C61 Malignant neoplasm of prostate: Secondary | ICD-10-CM | POA: Diagnosis not present

## 2018-02-06 DIAGNOSIS — I10 Essential (primary) hypertension: Secondary | ICD-10-CM | POA: Diagnosis present

## 2018-02-06 DIAGNOSIS — E119 Type 2 diabetes mellitus without complications: Secondary | ICD-10-CM | POA: Diagnosis present

## 2018-02-13 ENCOUNTER — Ambulatory Visit (INDEPENDENT_AMBULATORY_CARE_PROVIDER_SITE_OTHER): Payer: Medicare Other | Admitting: Podiatry

## 2018-02-13 DIAGNOSIS — E1149 Type 2 diabetes mellitus with other diabetic neurological complication: Secondary | ICD-10-CM

## 2018-02-13 DIAGNOSIS — M79674 Pain in right toe(s): Secondary | ICD-10-CM

## 2018-02-13 DIAGNOSIS — Q828 Other specified congenital malformations of skin: Secondary | ICD-10-CM

## 2018-02-13 DIAGNOSIS — M79675 Pain in left toe(s): Secondary | ICD-10-CM

## 2018-02-13 DIAGNOSIS — B351 Tinea unguium: Secondary | ICD-10-CM

## 2018-02-17 NOTE — Progress Notes (Signed)
Subjective: 82 y.o. returns the office today for painful, elongated, thickened toenails which he cannot trim himself. He did change shoes and the callus has been getting a little worse on the left foot. He also states that his big toenails are becoming. Denies any redness or drainage around the nails.  Denies any systemic complaints such as fevers, chills, nausea, vomiting.   PCP: Jefm Petty, MD  Objective: AAO 3, NAD DP/PT pulses palpable, CRT less than 3 seconds Nails hypertrophic, dystrophic, elongated, brittle, discolored x 10.. There is tenderness overlying the nails 1-5 bilaterally. There is no surrounding erythema or drainage along the nail sites. Small hyperkeratotic lesion submetatarsal right foot submet 1 and right medial hallux.  No underlying ulceration drainage or signs of infection. No open lesions or pre-ulcerative lesions are identified. No pain with calf compression, swelling, warmth, erythema.  Assessment: Patient presents with symptomatic onychomycosis; hyperkeratotic lesion  Plan: -Treatment options including alternatives, risks, complications were discussed -Nails sharply debrided 10 without complication/bleeding. -Hyperkeratotic lesion sharply debrided x1 without any complications or bleeding -Discussed daily foot inspection. If there are any changes, to call the office immediately.  -Follow-up in 4 weeks at his request.  Or sooner if any problems are to arise. In the meantime, encouraged to call the office with any questions, concerns, changes symptoms.  Celesta Gentile, DPM

## 2018-02-23 DIAGNOSIS — G2581 Restless legs syndrome: Secondary | ICD-10-CM | POA: Diagnosis not present

## 2018-02-23 DIAGNOSIS — M545 Low back pain: Secondary | ICD-10-CM | POA: Diagnosis not present

## 2018-02-24 DIAGNOSIS — E1121 Type 2 diabetes mellitus with diabetic nephropathy: Secondary | ICD-10-CM | POA: Diagnosis not present

## 2018-02-24 DIAGNOSIS — R351 Nocturia: Secondary | ICD-10-CM | POA: Diagnosis not present

## 2018-02-24 DIAGNOSIS — N401 Enlarged prostate with lower urinary tract symptoms: Secondary | ICD-10-CM | POA: Diagnosis not present

## 2018-02-24 DIAGNOSIS — Z23 Encounter for immunization: Secondary | ICD-10-CM | POA: Diagnosis not present

## 2018-02-24 DIAGNOSIS — N183 Chronic kidney disease, stage 3 (moderate): Secondary | ICD-10-CM | POA: Diagnosis not present

## 2018-02-24 DIAGNOSIS — I129 Hypertensive chronic kidney disease with stage 1 through stage 4 chronic kidney disease, or unspecified chronic kidney disease: Secondary | ICD-10-CM | POA: Diagnosis not present

## 2018-02-25 DIAGNOSIS — E291 Testicular hypofunction: Secondary | ICD-10-CM | POA: Diagnosis not present

## 2018-02-25 DIAGNOSIS — C679 Malignant neoplasm of bladder, unspecified: Secondary | ICD-10-CM | POA: Diagnosis not present

## 2018-02-25 DIAGNOSIS — R3914 Feeling of incomplete bladder emptying: Secondary | ICD-10-CM | POA: Diagnosis not present

## 2018-02-25 DIAGNOSIS — R300 Dysuria: Secondary | ICD-10-CM | POA: Diagnosis not present

## 2018-02-25 DIAGNOSIS — C680 Malignant neoplasm of urethra: Secondary | ICD-10-CM | POA: Insufficient documentation

## 2018-02-25 DIAGNOSIS — N400 Enlarged prostate without lower urinary tract symptoms: Secondary | ICD-10-CM | POA: Diagnosis not present

## 2018-03-03 DIAGNOSIS — H43822 Vitreomacular adhesion, left eye: Secondary | ICD-10-CM | POA: Diagnosis not present

## 2018-03-03 DIAGNOSIS — H35372 Puckering of macula, left eye: Secondary | ICD-10-CM | POA: Diagnosis not present

## 2018-03-03 DIAGNOSIS — H43812 Vitreous degeneration, left eye: Secondary | ICD-10-CM | POA: Diagnosis not present

## 2018-03-11 DIAGNOSIS — E291 Testicular hypofunction: Secondary | ICD-10-CM | POA: Diagnosis not present

## 2018-03-12 DIAGNOSIS — Z79899 Other long term (current) drug therapy: Secondary | ICD-10-CM | POA: Diagnosis not present

## 2018-03-12 DIAGNOSIS — M150 Primary generalized (osteo)arthritis: Secondary | ICD-10-CM | POA: Diagnosis not present

## 2018-03-12 DIAGNOSIS — M3500 Sicca syndrome, unspecified: Secondary | ICD-10-CM | POA: Diagnosis not present

## 2018-03-12 DIAGNOSIS — M1009 Idiopathic gout, multiple sites: Secondary | ICD-10-CM | POA: Diagnosis not present

## 2018-03-12 DIAGNOSIS — Z6831 Body mass index (BMI) 31.0-31.9, adult: Secondary | ICD-10-CM | POA: Diagnosis not present

## 2018-03-12 DIAGNOSIS — E669 Obesity, unspecified: Secondary | ICD-10-CM | POA: Diagnosis not present

## 2018-03-12 DIAGNOSIS — M2550 Pain in unspecified joint: Secondary | ICD-10-CM | POA: Diagnosis not present

## 2018-03-16 ENCOUNTER — Ambulatory Visit (INDEPENDENT_AMBULATORY_CARE_PROVIDER_SITE_OTHER): Payer: Medicare Other | Admitting: Podiatry

## 2018-03-16 ENCOUNTER — Encounter: Payer: Self-pay | Admitting: Podiatry

## 2018-03-16 DIAGNOSIS — Q828 Other specified congenital malformations of skin: Secondary | ICD-10-CM

## 2018-03-16 DIAGNOSIS — E1149 Type 2 diabetes mellitus with other diabetic neurological complication: Secondary | ICD-10-CM

## 2018-03-16 DIAGNOSIS — M79675 Pain in left toe(s): Secondary | ICD-10-CM | POA: Diagnosis not present

## 2018-03-16 DIAGNOSIS — M79674 Pain in right toe(s): Secondary | ICD-10-CM

## 2018-03-16 DIAGNOSIS — B351 Tinea unguium: Secondary | ICD-10-CM | POA: Diagnosis not present

## 2018-03-19 NOTE — Progress Notes (Signed)
Subjective: 82 y.o. returns the office today for painful, elongated, thickened toenails which he cannot trim himself.  He also states that the callus on the left foot is becoming more bothersome.  Denies any drainage or swelling to the area or any redness.  Denies any redness or drainage around the nails.  Denies any systemic complaints such as fevers, chills, nausea, vomiting.   PCP: Jefm Petty, MD  Objective: AAO 3, NAD DP/PT pulses palpable, CRT less than 3 seconds Nails hypertrophic, dystrophic, elongated, brittle, discolored x 10.. There is tenderness overlying the nails 1-5 bilaterally. There is no surrounding erythema or drainage along the nail sites. Small hyperkeratotic lesion submetatarsal right foot submet 1 and right medial hallux.  No underlying ulceration drainage or signs of infection. No open lesions or pre-ulcerative lesions are identified. No pain with calf compression, swelling, warmth, erythema.  Assessment: Patient presents with symptomatic onychomycosis; hyperkeratotic lesion  Plan: -Treatment options including alternatives, risks, complications were discussed -Nails sharply debrided 10 without complication/bleeding. -Hyperkeratotic lesion sharply debrided x1 without any complications or bleeding -Discussed daily foot inspection. If there are any changes, to call the office immediately.  -Follow-up in 4 weeks at his request.  Or sooner if any problems are to arise. In the meantime, encouraged to call the office with any questions, concerns, changes symptoms.  Celesta Gentile, DPM

## 2018-03-27 DIAGNOSIS — E291 Testicular hypofunction: Secondary | ICD-10-CM | POA: Diagnosis not present

## 2018-04-10 DIAGNOSIS — R8290 Unspecified abnormal findings in urine: Secondary | ICD-10-CM | POA: Diagnosis not present

## 2018-04-10 DIAGNOSIS — N481 Balanitis: Secondary | ICD-10-CM | POA: Diagnosis not present

## 2018-04-10 DIAGNOSIS — N4889 Other specified disorders of penis: Secondary | ICD-10-CM | POA: Insufficient documentation

## 2018-04-10 DIAGNOSIS — E291 Testicular hypofunction: Secondary | ICD-10-CM | POA: Diagnosis not present

## 2018-04-13 ENCOUNTER — Encounter: Payer: Self-pay | Admitting: Podiatry

## 2018-04-13 ENCOUNTER — Ambulatory Visit (INDEPENDENT_AMBULATORY_CARE_PROVIDER_SITE_OTHER): Payer: Medicare Other | Admitting: Podiatry

## 2018-04-13 DIAGNOSIS — E1142 Type 2 diabetes mellitus with diabetic polyneuropathy: Secondary | ICD-10-CM

## 2018-04-13 DIAGNOSIS — M79674 Pain in right toe(s): Secondary | ICD-10-CM

## 2018-04-13 DIAGNOSIS — B351 Tinea unguium: Secondary | ICD-10-CM

## 2018-04-13 DIAGNOSIS — M79675 Pain in left toe(s): Secondary | ICD-10-CM

## 2018-04-13 NOTE — Progress Notes (Signed)
Subjective: Jason Hartman is a 83 y.o. y.o. male who presents today with  cc of painful, discolored, thick toenails and painful callus right foot which interferes with daily activities. Pain is aggravated when wearing enclosed shoe gear and relieved with periodic professional debridement.  He also has RLS and has not been able to get any rest. He has an appointment with Neurology this Wednesday.   Today is his cash visit for nail care. He is seen monthly. He is billed Medicare every 9 weeks  and he pays cash every other month.  Jefm Petty, MD is his PCP.   Current Outpatient Medications:  .  acetaminophen (TYLENOL) 500 MG tablet, Take by mouth., Disp: , Rfl:  .  acyclovir (ZOVIRAX) 800 MG tablet, Take by mouth., Disp: , Rfl:  .  allopurinol (ZYLOPRIM) 100 MG tablet, Take 2 tablets daily., Disp: 60 tablet, Rfl: 0 .  allopurinol (ZYLOPRIM) 100 MG tablet, TAKE 2 TABLETS BY MOUTH EVERY DAY, Disp: 60 tablet, Rfl: 0 .  allopurinol (ZYLOPRIM) 100 MG tablet, Take 1 tablet (100 mg total) by mouth daily., Disp: 60 tablet, Rfl: 0 .  ALPRAZolam (XANAX) 0.5 MG tablet, Take 1 tab twice a day as needed for anxiety, Disp: , Rfl:  .  amLODipine (NORVASC) 5 MG tablet, , Disp: , Rfl:  .  aspirin EC 325 MG tablet, Take 325 mg by mouth daily. 1-2 daily prn, Disp: , Rfl:  .  BYSTOLIC 5 MG tablet, , Disp: , Rfl:  .  cetirizine (ZYRTEC) 10 MG tablet, Take by mouth., Disp: , Rfl:  .  chlorthalidone (HYGROTON) 25 MG tablet, Take by mouth., Disp: , Rfl:  .  ciprofloxacin (CIPRO) 500 MG tablet, TK 1 T PO BID, Disp: , Rfl: 0 .  clotrimazole-betamethasone (LOTRISONE) cream, Apply to affected area twice daily, Disp: , Rfl:  .  co-enzyme Q-10 50 MG capsule, Take 50 mg by mouth daily., Disp: , Rfl:  .  colchicine 0.6 MG tablet, Take 1 tablet (0.6 mg total) by mouth daily., Disp: 10 tablet, Rfl: 0 .  colchicine 0.6 MG tablet, Take 1 tablet (0.6 mg total) by mouth daily., Disp: 10 tablet, Rfl: 0 .  desonide (DESOWEN)  0.05 % cream, Apply topically., Disp: , Rfl:  .  diclofenac sodium (VOLTAREN) 1 % GEL, Apply topically., Disp: , Rfl:  .  DIOVAN 320 MG tablet, , Disp: , Rfl:  .  DOCOSAHEXAENOIC ACID PO, Take 1 g by mouth., Disp: , Rfl:  .  famotidine (PEPCID) 40 MG tablet, Take by mouth., Disp: , Rfl:  .  Flunisolide HFA (AEROSPAN) 80 MCG/ACT AERS, Inhale into the lungs., Disp: , Rfl:  .  fluticasone (FLONASE) 50 MCG/ACT nasal spray, Place into the nose., Disp: , Rfl:  .  fluvoxaMINE (LUVOX) 25 MG tablet, , Disp: , Rfl: 5 .  gabapentin (NEURONTIN) 600 MG tablet, TAKE 2 TABLETS BY MOUTH THREE TIMES DAILY, Disp: , Rfl:  .  Gabapentin Enacarbil (HORIZANT) 600 MG TB24, Take 600 mg by mouth 2 (two) times daily., Disp: 180 tablet, Rfl: 3 .  hydrochlorothiazide (HYDRODIURIL) 12.5 MG tablet, TK 1 T PO QD IN THE MORNING, Disp: , Rfl: 11 .  HYDROcodone-acetaminophen (NORCO) 10-325 MG per tablet, Take 1 tablet by mouth every 4 (four) hours as needed (for pain)., Disp: 30 tablet, Rfl: 0 .  HYDROcodone-acetaminophen (NORCO/VICODIN) 5-325 MG per tablet, Take 1-2 tablets by mouth daily. , Disp: , Rfl:  .  ketorolac (TORADOL) 10 MG tablet, Take 1 tablet (10  mg total) by mouth every 6 (six) hours as needed for moderate pain., Disp: 20 tablet, Rfl: 0 .  L-METHYLFOLATE CALCIUM PO, Take by mouth., Disp: , Rfl:  .  L-Methylfolate-Algae-B12-B6 (METANX) 3-90.314-2-35 MG CAPS, Take by mouth., Disp: , Rfl:  .  LANTUS SOLOSTAR 100 UNIT/ML SOPN, , Disp: , Rfl:  .  liraglutide (VICTOZA) 18 MG/3ML SOPN, Inject into the skin., Disp: , Rfl:  .  magnesium citrate SOLN, Take by mouth., Disp: , Rfl:  .  magnesium citrate SOLN, Take by mouth., Disp: , Rfl:  .  meloxicam (MOBIC) 15 MG tablet, Take 1 tablet (15 mg total) by mouth daily., Disp: 10 tablet, Rfl: 0 .  metFORMIN (GLUCOPHAGE-XR) 500 MG 24 hr tablet, , Disp: , Rfl:  .  multivitamin (METANX) 3-35-2 MG TABS tablet, Take 1 tablet by mouth 2 (two) times daily., Disp: 180 tablet, Rfl: 3 .   nitroGLYCERIN (NITROSTAT) 0.4 MG SL tablet, Place under the tongue., Disp: , Rfl:  .  NONFORMULARY OR COMPOUNDED ITEM, [facility]:  Antiinflammatory cream - Diclofenac 3%, Baclofen 2%, Lidocaine 2%, apply 1-2 grams to affected area 3-4 times daily., Disp: 120 each, Rfl: 2 .  NOVOFINE 32G X 6 MM MISC, , Disp: , Rfl:  .  olmesartan (BENICAR) 40 MG tablet, TAKE 1 TABLET(40 MG) BY MOUTH DAILY, Disp: , Rfl:  .  Omega-3 1000 MG CAPS, Take by mouth., Disp: , Rfl:  .  omega-3 acid ethyl esters (LOVAZA) 1 G capsule, Take 2 g by mouth 2 (two) times daily. 4 Tabs qd, Disp: , Rfl:  .  ONE TOUCH ULTRA TEST test strip, , Disp: , Rfl:  .  ONETOUCH DELICA LANCETS 01B MISC, , Disp: , Rfl:  .  Polyethylene Glycol 3350 (PEG 3350) POWD, Take by mouth., Disp: , Rfl:  .  predniSONE (DELTASONE) 5 MG tablet, , Disp: , Rfl: 0 .  rotigotine (NEUPRO) 6 MG/24HR, Place 1 patch onto the skin daily., Disp: 90 patch, Rfl: 3 .  STENDRA 200 MG TABS, as needed., Disp: , Rfl:  .  tamsulosin (FLOMAX) 0.4 MG CAPS capsule, Take one capsule daily until stone passes., Disp: 30 capsule, Rfl: 0 .  terazosin (HYTRIN) 2 MG capsule, Take by mouth., Disp: , Rfl:  .  testosterone cypionate (DEPOTESTOTERONE CYPIONATE) 100 MG/ML injection, Inject into the muscle., Disp: , Rfl:  .  triamcinolone lotion (KENALOG) 0.1 %, , Disp: , Rfl:  .  Ubiquinol 100 MG CAPS, Take by mouth., Disp: , Rfl:  .  Urea 39 % CREA, Apply topically as needed, Disp: , Rfl:  .  Vilazodone HCl (VIIBRYD) 10 MG TABS, Take 1/2 a day for a week, then 1 a day, Disp: , Rfl:   Allergies  Allergen Reactions  . Atorvastatin Itching and Other (See Comments)    Tired, weakness Tired, weakness TIRED Tired, weakness   . Colesevelam Other (See Comments)    tired TIRED tired   . Iodinated Diagnostic Agents Other (See Comments)    HIVES HIVES   . Iohexol      Code: HIVES, Desc: alleric to renografin,isovue & omnipaque, hives, requires 13 hr prep//a.calhoun, Onset  Date: [id number]   . Lovastatin Other (See Comments)    Tired, nervousness TIRED Tired, nervousness   . Methadone Hcl Other (See Comments)    HALLUCINATIONS HALLUCINATIONS   . Morphine Sulfate   . Other Other (See Comments)    HIVES  . Oxycodone Hcl   . Penicillins   . Promethazine Other (See  Comments)    Mental status change DELIRIUM Mental status change   . Quetiapine   . Rosuvastatin Other (See Comments)    Tired, weakness TIRED Tired, weakness   . Zolpidem Other (See Comments)  . Carbidopa-Levodopa Anxiety  . Clindamycin Rash  . Doxazosin Rash  . Iodine Other (See Comments) and Rash    HIVES  . Linezolid Other (See Comments) and Rash    HIVES     Objective:  Vascular Examination: Capillary refill time <3 seconds x 10 digits Dorsalis pedis and Posterior tibial pulses palpable b/l Digital hair present x 10 digits Skin temperature gradient WNL b/l  Dermatological Examination: Skin with normal turgor, texture and tone b/l  Toenails 1-5 b/l discolored, thick, dystrophic with subungual debris and pain with palpation to nailbeds due to thickness of nails.  Small hyperkeratotic lesion submet head 1 right foot and right medial hallux. No edema, no erythema, no drainage, no flocculence.  Musculoskeletal: Muscle strength 5/5 to all LE muscle groups  Neurological: Sensation diminished with 10 gram monofilament. Vibratory sensation diminished  Assessment: Painful onychomycosis toenails 1-5 b/l NIDDM with neuropathy   Plan: 1.  His next Medicare eligible visit is May 18, 2018. 2. Continue diabetic foot care principles.  3. Toenails 1-5 b/l debrided in length and girth. Offending nail borders b/l great toes debrided and curretaged.  4. Calluses not debrided today. 5. Patient to continue soft, supportive shoe gear 6. Patient to report any pedal injuries to medical professional  7. Follow up 1 month and that will be a Medicare eligible  visit. 8. Patient/POA to call should there be a concern in the interim.

## 2018-04-13 NOTE — Patient Instructions (Signed)
Diabetes Mellitus and Foot Care Foot care is an important part of your health, especially when you have diabetes. Diabetes may cause you to have problems because of poor blood flow (circulation) to your feet and legs, which can cause your skin to:  Become thinner and drier.  Break more easily.  Heal more slowly.  Peel and crack. You may also have nerve damage (neuropathy) in your legs and feet, causing decreased feeling in them. This means that you may not notice minor injuries to your feet that could lead to more serious problems. Noticing and addressing any potential problems early is the best way to prevent future foot problems. How to care for your feet Foot hygiene  Wash your feet daily with warm water and mild soap. Do not use hot water. Then, pat your feet and the areas between your toes until they are completely dry. Do not soak your feet as this can dry your skin.  Trim your toenails straight across. Do not dig under them or around the cuticle. File the edges of your nails with an emery board or nail file.  Apply a moisturizing lotion or petroleum jelly to the skin on your feet and to dry, brittle toenails. Use lotion that does not contain alcohol and is unscented. Do not apply lotion between your toes. Shoes and socks  Wear clean socks or stockings every day. Make sure they are not too tight. Do not wear knee-high stockings since they may decrease blood flow to your legs.  Wear shoes that fit properly and have enough cushioning. Always look in your shoes before you put them on to be sure there are no objects inside.  To break in new shoes, wear them for just a few hours a day. This prevents injuries on your feet. Wounds, scrapes, corns, and calluses  Check your feet daily for blisters, cuts, bruises, sores, and redness. If you cannot see the bottom of your feet, use a mirror or ask someone for help.  Do not cut corns or calluses or try to remove them with medicine.  If you  find a minor scrape, cut, or break in the skin on your feet, keep it and the skin around it clean and dry. You may clean these areas with mild soap and water. Do not clean the area with peroxide, alcohol, or iodine.  If you have a wound, scrape, corn, or callus on your foot, look at it several times a day to make sure it is healing and not infected. Check for: ? Redness, swelling, or pain. ? Fluid or blood. ? Warmth. ? Pus or a bad smell. General instructions  Do not cross your legs. This may decrease blood flow to your feet.  Do not use heating pads or hot water bottles on your feet. They may burn your skin. If you have lost feeling in your feet or legs, you may not know this is happening until it is too late.  Protect your feet from hot and cold by wearing shoes, such as at the beach or on hot pavement.  Schedule a complete foot exam at least once a year (annually) or more often if you have foot problems. If you have foot problems, report any cuts, sores, or bruises to your health care provider immediately. Contact a health care provider if:  You have a medical condition that increases your risk of infection and you have any cuts, sores, or bruises on your feet.  You have an injury that is not   healing.  You have redness on your legs or feet.  You feel burning or tingling in your legs or feet.  You have pain or cramps in your legs and feet.  Your legs or feet are numb.  Your feet always feel cold.  You have pain around a toenail. Get help right away if:  You have a wound, scrape, corn, or callus on your foot and: ? You have pain, swelling, or redness that gets worse. ? You have fluid or blood coming from the wound, scrape, corn, or callus. ? Your wound, scrape, corn, or callus feels warm to the touch. ? You have pus or a bad smell coming from the wound, scrape, corn, or callus. ? You have a fever. ? You have a red line going up your leg. Summary  Check your feet every day  for cuts, sores, red spots, swelling, and blisters.  Moisturize feet and legs daily.  Wear shoes that fit properly and have enough cushioning.  If you have foot problems, report any cuts, sores, or bruises to your health care provider immediately.  Schedule a complete foot exam at least once a year (annually) or more often if you have foot problems. This information is not intended to replace advice given to you by your health care provider. Make sure you discuss any questions you have with your health care provider. Document Released: 03/22/2000 Document Revised: 05/07/2017 Document Reviewed: 04/26/2016 Elsevier Interactive Patient Education  2019 [employer]  Onychomycosis/Fungal Toenails  WHAT IS IT? An infection that lies within the keratin of your nail plate that is caused by a fungus.  WHY ME? Fungal infections affect all ages, sexes, races, and creeds.  There may be many factors that predispose you to a fungal infection such as age, coexisting medical conditions such as diabetes, or an autoimmune disease; stress, medications, fatigue, genetics, etc.  Bottom line: fungus thrives in a warm, moist environment and your shoes offer such a location.  IS IT CONTAGIOUS? Theoretically, yes.  You do not want to share shoes, nail clippers or files with someone who has fungal toenails.  Walking around barefoot in the same room or sleeping in the same bed is unlikely to transfer the organism.  It is important to realize, however, that fungus can spread easily from one nail to the next on the same foot.  HOW DO WE TREAT THIS?  There are several ways to treat this condition.  Treatment may depend on many factors such as age, medications, pregnancy, liver and kidney conditions, etc.  It is best to ask your doctor which options are available to you.  1. No treatment.   Unlike many other medical concerns, you can live with this condition.  However for many people this can be a painful condition and  may lead to ingrown toenails or a bacterial infection.  It is recommended that you keep the nails cut short to help reduce the amount of fungal nail. 2. Topical treatment.  These range from herbal remedies to prescription strength nail lacquers.  About 40-50% effective, topicals require twice daily application for approximately 9 to 12 months or until an entirely new nail has grown out.  The most effective topicals are medical grade medications available through physicians offices. 3. Oral antifungal medications.  With an 80-90% cure rate, the most common oral medication requires 3 to 4 months of therapy and stays in your system for a year as the new nail grows out.  Oral antifungal medications do require   blood work to make sure it is a safe drug for you.  A liver function panel will be performed prior to starting the medication and after the first month of treatment.  It is important to have the blood work performed to avoid any harmful side effects.  In general, this medication safe but blood work is required. 4. Laser Therapy.  This treatment is performed by applying a specialized laser to the affected nail plate.  This therapy is noninvasive, fast, and non-painful.  It is not covered by insurance and is therefore, out of pocket.  The results have been very good with a 80-95% cure rate.  [facility] is the only practice in the area to offer this therapy. 5. Permanent Nail Avulsion.  Removing the entire nail so that a new nail will not grow back. 

## 2018-04-15 DIAGNOSIS — E611 Iron deficiency: Secondary | ICD-10-CM | POA: Diagnosis not present

## 2018-04-15 DIAGNOSIS — M545 Low back pain: Secondary | ICD-10-CM | POA: Diagnosis not present

## 2018-04-15 DIAGNOSIS — G2581 Restless legs syndrome: Secondary | ICD-10-CM | POA: Diagnosis not present

## 2018-04-17 DIAGNOSIS — N35811 Other urethral stricture, male, meatal: Secondary | ICD-10-CM | POA: Diagnosis not present

## 2018-04-17 DIAGNOSIS — N3289 Other specified disorders of bladder: Secondary | ICD-10-CM | POA: Diagnosis not present

## 2018-04-17 DIAGNOSIS — N400 Enlarged prostate without lower urinary tract symptoms: Secondary | ICD-10-CM | POA: Diagnosis not present

## 2018-04-17 DIAGNOSIS — C680 Malignant neoplasm of urethra: Secondary | ICD-10-CM | POA: Diagnosis not present

## 2018-04-17 DIAGNOSIS — R310 Gross hematuria: Secondary | ICD-10-CM | POA: Diagnosis not present

## 2018-04-24 DIAGNOSIS — E291 Testicular hypofunction: Secondary | ICD-10-CM | POA: Diagnosis not present

## 2018-05-04 DIAGNOSIS — E782 Mixed hyperlipidemia: Secondary | ICD-10-CM | POA: Diagnosis not present

## 2018-05-04 DIAGNOSIS — I10 Essential (primary) hypertension: Secondary | ICD-10-CM | POA: Diagnosis not present

## 2018-05-04 DIAGNOSIS — E1159 Type 2 diabetes mellitus with other circulatory complications: Secondary | ICD-10-CM | POA: Diagnosis not present

## 2018-05-04 DIAGNOSIS — Z794 Long term (current) use of insulin: Secondary | ICD-10-CM | POA: Diagnosis not present

## 2018-05-04 DIAGNOSIS — Z6831 Body mass index (BMI) 31.0-31.9, adult: Secondary | ICD-10-CM | POA: Diagnosis not present

## 2018-05-06 DIAGNOSIS — E1121 Type 2 diabetes mellitus with diabetic nephropathy: Secondary | ICD-10-CM | POA: Diagnosis not present

## 2018-05-06 DIAGNOSIS — I129 Hypertensive chronic kidney disease with stage 1 through stage 4 chronic kidney disease, or unspecified chronic kidney disease: Secondary | ICD-10-CM | POA: Diagnosis not present

## 2018-05-06 DIAGNOSIS — G2581 Restless legs syndrome: Secondary | ICD-10-CM | POA: Diagnosis not present

## 2018-05-06 DIAGNOSIS — N183 Chronic kidney disease, stage 3 (moderate): Secondary | ICD-10-CM | POA: Diagnosis not present

## 2018-05-06 DIAGNOSIS — K5901 Slow transit constipation: Secondary | ICD-10-CM | POA: Diagnosis not present

## 2018-05-08 DIAGNOSIS — E291 Testicular hypofunction: Secondary | ICD-10-CM | POA: Diagnosis not present

## 2018-05-11 DIAGNOSIS — R319 Hematuria, unspecified: Secondary | ICD-10-CM | POA: Diagnosis not present

## 2018-05-11 DIAGNOSIS — Z87891 Personal history of nicotine dependence: Secondary | ICD-10-CM | POA: Diagnosis not present

## 2018-05-11 DIAGNOSIS — N35911 Unspecified urethral stricture, male, meatal: Secondary | ICD-10-CM | POA: Diagnosis not present

## 2018-05-11 DIAGNOSIS — E1122 Type 2 diabetes mellitus with diabetic chronic kidney disease: Secondary | ICD-10-CM | POA: Diagnosis not present

## 2018-05-11 DIAGNOSIS — Z794 Long term (current) use of insulin: Secondary | ICD-10-CM | POA: Diagnosis not present

## 2018-05-11 DIAGNOSIS — N183 Chronic kidney disease, stage 3 (moderate): Secondary | ICD-10-CM | POA: Diagnosis not present

## 2018-05-11 DIAGNOSIS — D4959 Neoplasm of unspecified behavior of other genitourinary organ: Secondary | ICD-10-CM | POA: Diagnosis not present

## 2018-05-11 DIAGNOSIS — I129 Hypertensive chronic kidney disease with stage 1 through stage 4 chronic kidney disease, or unspecified chronic kidney disease: Secondary | ICD-10-CM | POA: Diagnosis not present

## 2018-05-11 DIAGNOSIS — N320 Bladder-neck obstruction: Secondary | ICD-10-CM | POA: Diagnosis not present

## 2018-05-11 DIAGNOSIS — N400 Enlarged prostate without lower urinary tract symptoms: Secondary | ICD-10-CM | POA: Diagnosis not present

## 2018-05-11 DIAGNOSIS — N3001 Acute cystitis with hematuria: Secondary | ICD-10-CM | POA: Diagnosis not present

## 2018-05-11 DIAGNOSIS — N3289 Other specified disorders of bladder: Secondary | ICD-10-CM | POA: Diagnosis not present

## 2018-05-11 DIAGNOSIS — G4730 Sleep apnea, unspecified: Secondary | ICD-10-CM | POA: Diagnosis not present

## 2018-05-12 DIAGNOSIS — E1122 Type 2 diabetes mellitus with diabetic chronic kidney disease: Secondary | ICD-10-CM | POA: Diagnosis not present

## 2018-05-12 DIAGNOSIS — N35911 Unspecified urethral stricture, male, meatal: Secondary | ICD-10-CM | POA: Diagnosis not present

## 2018-05-12 DIAGNOSIS — G4730 Sleep apnea, unspecified: Secondary | ICD-10-CM | POA: Diagnosis not present

## 2018-05-12 DIAGNOSIS — R319 Hematuria, unspecified: Secondary | ICD-10-CM | POA: Diagnosis not present

## 2018-05-12 DIAGNOSIS — N320 Bladder-neck obstruction: Secondary | ICD-10-CM | POA: Diagnosis not present

## 2018-05-12 DIAGNOSIS — N400 Enlarged prostate without lower urinary tract symptoms: Secondary | ICD-10-CM | POA: Diagnosis not present

## 2018-05-12 DIAGNOSIS — C679 Malignant neoplasm of bladder, unspecified: Secondary | ICD-10-CM | POA: Diagnosis not present

## 2018-05-15 DIAGNOSIS — F410 Panic disorder [episodic paroxysmal anxiety] without agoraphobia: Secondary | ICD-10-CM | POA: Diagnosis not present

## 2018-05-18 ENCOUNTER — Ambulatory Visit (INDEPENDENT_AMBULATORY_CARE_PROVIDER_SITE_OTHER): Payer: Medicare Other | Admitting: Podiatry

## 2018-05-18 ENCOUNTER — Encounter: Payer: Self-pay | Admitting: Podiatry

## 2018-05-18 DIAGNOSIS — L84 Corns and callosities: Secondary | ICD-10-CM | POA: Diagnosis not present

## 2018-05-18 DIAGNOSIS — M79674 Pain in right toe(s): Secondary | ICD-10-CM

## 2018-05-18 DIAGNOSIS — B351 Tinea unguium: Secondary | ICD-10-CM

## 2018-05-18 DIAGNOSIS — M79675 Pain in left toe(s): Secondary | ICD-10-CM | POA: Diagnosis not present

## 2018-05-18 DIAGNOSIS — E1142 Type 2 diabetes mellitus with diabetic polyneuropathy: Secondary | ICD-10-CM | POA: Diagnosis not present

## 2018-05-18 NOTE — Progress Notes (Signed)
Subjective: Jason Hartman presents today with painful, thick toenails 1-5 b/l that he cannot cut and which interfere with daily activities.  Pain is aggravated when wearing enclosed shoe gear.  Jefm Petty, MD is his PCP.   Jason Hartman continues to suffer from a severe case of RLS. He is followed by Neurology for this.   Current Outpatient Medications:  .  acetaminophen (TYLENOL) 500 MG tablet, Take by mouth., Disp: , Rfl:  .  acyclovir (ZOVIRAX) 800 MG tablet, Take by mouth., Disp: , Rfl:  .  allopurinol (ZYLOPRIM) 100 MG tablet, Take 2 tablets daily., Disp: 60 tablet, Rfl: 0 .  allopurinol (ZYLOPRIM) 100 MG tablet, TAKE 2 TABLETS BY MOUTH EVERY DAY, Disp: 60 tablet, Rfl: 0 .  allopurinol (ZYLOPRIM) 100 MG tablet, Take 1 tablet (100 mg total) by mouth daily., Disp: 60 tablet, Rfl: 0 .  ALPRAZolam (XANAX) 0.5 MG tablet, Take 1 tab twice a day as needed for anxiety, Disp: , Rfl:  .  amantadine (SYMMETREL) 100 MG capsule, TK 1 C PO QHS FOR 3 DAYS THEN TK 1 C PO BID, Disp: , Rfl:  .  amLODipine (NORVASC) 5 MG tablet, , Disp: , Rfl:  .  aspirin EC 325 MG tablet, Take 325 mg by mouth daily. 1-2 daily prn, Disp: , Rfl:  .  BYSTOLIC 5 MG tablet, , Disp: , Rfl:  .  cetirizine (ZYRTEC) 10 MG tablet, Take by mouth., Disp: , Rfl:  .  chlorthalidone (HYGROTON) 25 MG tablet, Take by mouth., Disp: , Rfl:  .  ciprofloxacin (CIPRO) 500 MG tablet, TK 1 T PO BID, Disp: , Rfl: 0 .  clonazePAM (KLONOPIN) 0.5 MG tablet, Take 1 tab twice a day consistently, and 1 extra a day if needed (to replace xanax), Disp: , Rfl:  .  clotrimazole-betamethasone (LOTRISONE) cream, Apply to affected area twice daily, Disp: , Rfl:  .  co-enzyme Q-10 50 MG capsule, Take 50 mg by mouth daily., Disp: , Rfl:  .  colchicine 0.6 MG tablet, Take 1 tablet (0.6 mg total) by mouth daily., Disp: 10 tablet, Rfl: 0 .  colchicine 0.6 MG tablet, Take 1 tablet (0.6 mg total) by mouth daily., Disp: 10 tablet, Rfl: 0 .  desonide (DESOWEN) 0.05  % cream, Apply topically., Disp: , Rfl:  .  diclofenac sodium (VOLTAREN) 1 % GEL, Apply topically., Disp: , Rfl:  .  DIOVAN 320 MG tablet, , Disp: , Rfl:  .  DOCOSAHEXAENOIC ACID PO, Take 1 g by mouth., Disp: , Rfl:  .  famotidine (PEPCID) 40 MG tablet, Take by mouth., Disp: , Rfl:  .  Flunisolide HFA (AEROSPAN) 80 MCG/ACT AERS, Inhale into the lungs., Disp: , Rfl:  .  fluticasone (FLONASE) 50 MCG/ACT nasal spray, Place into the nose., Disp: , Rfl:  .  fluvoxaMINE (LUVOX) 25 MG tablet, , Disp: , Rfl: 5 .  gabapentin (NEURONTIN) 600 MG tablet, TAKE 2 TABLETS BY MOUTH THREE TIMES DAILY, Disp: , Rfl:  .  Gabapentin Enacarbil (HORIZANT) 600 MG TB24, Take 600 mg by mouth 2 (two) times daily., Disp: 180 tablet, Rfl: 3 .  hydrochlorothiazide (HYDRODIURIL) 12.5 MG tablet, TK 1 T PO QD IN THE MORNING, Disp: , Rfl: 11 .  HYDROcodone-acetaminophen (NORCO) 10-325 MG per tablet, Take 1 tablet by mouth every 4 (four) hours as needed (for pain)., Disp: 30 tablet, Rfl: 0 .  HYDROcodone-acetaminophen (NORCO/VICODIN) 5-325 MG per tablet, Take 1-2 tablets by mouth daily. , Disp: , Rfl:  .  ketorolac (  TORADOL) 10 MG tablet, Take 1 tablet (10 mg total) by mouth every 6 (six) hours as needed for moderate pain., Disp: 20 tablet, Rfl: 0 .  L-METHYLFOLATE CALCIUM PO, Take by mouth., Disp: , Rfl:  .  L-Methylfolate-Algae-B12-B6 (METANX) 3-90.314-2-35 MG CAPS, Take by mouth., Disp: , Rfl:  .  LANTUS SOLOSTAR 100 UNIT/ML SOPN, , Disp: , Rfl:  .  liraglutide (VICTOZA) 18 MG/3ML SOPN, Inject into the skin., Disp: , Rfl:  .  magnesium citrate SOLN, Take by mouth., Disp: , Rfl:  .  magnesium citrate SOLN, Take by mouth., Disp: , Rfl:  .  meloxicam (MOBIC) 15 MG tablet, Take 1 tablet (15 mg total) by mouth daily., Disp: 10 tablet, Rfl: 0 .  metFORMIN (GLUCOPHAGE-XR) 500 MG 24 hr tablet, , Disp: , Rfl:  .  multivitamin (METANX) 3-35-2 MG TABS tablet, Take 1 tablet by mouth 2 (two) times daily., Disp: 180 tablet, Rfl: 3 .   nitroGLYCERIN (NITROSTAT) 0.4 MG SL tablet, Place under the tongue., Disp: , Rfl:  .  NONFORMULARY OR COMPOUNDED ITEM, [facility]:  Antiinflammatory cream - Diclofenac 3%, Baclofen 2%, Lidocaine 2%, apply 1-2 grams to affected area 3-4 times daily., Disp: 120 each, Rfl: 2 .  NOVOFINE 32G X 6 MM MISC, , Disp: , Rfl:  .  olmesartan (BENICAR) 40 MG tablet, TAKE 1 TABLET(40 MG) BY MOUTH DAILY, Disp: , Rfl:  .  Omega-3 1000 MG CAPS, Take by mouth., Disp: , Rfl:  .  omega-3 acid ethyl esters (LOVAZA) 1 G capsule, Take 2 g by mouth 2 (two) times daily. 4 Tabs qd, Disp: , Rfl:  .  ONE TOUCH ULTRA TEST test strip, , Disp: , Rfl:  .  ONETOUCH DELICA LANCETS 88C MISC, , Disp: , Rfl:  .  oxybutynin (DITROPAN) 5 MG tablet, TK 1 T PO BID, Disp: , Rfl:  .  OXYCONTIN 10 MG 12 hr tablet, TK 1 T PO Q 12 H FOR 5 DAYS, Disp: , Rfl:  .  Polyethylene Glycol 3350 (PEG 3350) POWD, Take by mouth., Disp: , Rfl:  .  predniSONE (DELTASONE) 5 MG tablet, , Disp: , Rfl: 0 .  rotigotine (NEUPRO) 6 MG/24HR, Place 1 patch onto the skin daily., Disp: 90 patch, Rfl: 3 .  STENDRA 200 MG TABS, as needed., Disp: , Rfl:  .  tamsulosin (FLOMAX) 0.4 MG CAPS capsule, Take one capsule daily until stone passes., Disp: 30 capsule, Rfl: 0 .  terazosin (HYTRIN) 2 MG capsule, Take by mouth., Disp: , Rfl:  .  testosterone cypionate (DEPOTESTOTERONE CYPIONATE) 100 MG/ML injection, Inject into the muscle., Disp: , Rfl:  .  triamcinolone lotion (KENALOG) 0.1 %, , Disp: , Rfl:  .  Ubiquinol 100 MG CAPS, Take by mouth., Disp: , Rfl:  .  Urea 39 % CREA, Apply topically as needed, Disp: , Rfl:  .  Vilazodone HCl (VIIBRYD) 10 MG TABS, Take 1/2 a day for a week, then 1 a day, Disp: , Rfl:   Allergies  Allergen Reactions  . Atorvastatin Itching and Other (See Comments)    Tired, weakness Tired, weakness TIRED Tired, weakness   . Colesevelam Other (See Comments)    tired TIRED tired   . Iodinated Diagnostic Agents Other (See Comments)     HIVES HIVES   . Iohexol      Code: HIVES, Desc: alleric to renografin,isovue & omnipaque, hives, requires 13 hr prep//a.calhoun, Onset Date: 16606301   . Lovastatin Other (See Comments)    Tired, nervousness  TIRED Tired, nervousness   . Methadone Hcl Other (See Comments)    HALLUCINATIONS HALLUCINATIONS   . Morphine Sulfate   . Other Other (See Comments)    HIVES  . Oxycodone Hcl   . Penicillins   . Promethazine Other (See Comments)    Mental status change DELIRIUM Mental status change   . Quetiapine   . Rosuvastatin Other (See Comments)    Tired, weakness TIRED Tired, weakness   . Zolpidem Other (See Comments)  . Carbidopa-Levodopa Anxiety  . Clindamycin Rash  . Doxazosin Rash  . Iodine Other (See Comments) and Rash    HIVES  . Linezolid Other (See Comments) and Rash    HIVES     Objective:  Vascular Examination: Capillary refill time <3 seconds x 10 digits  Dorsalis pedis and Posterior tibial pulses palpable b/l  Digital hair present x 10 digits  Skin temperature gradient WNL b/l  Dermatological Examination: Skin with normal turgor, texture and tone b/l  Toenails 1-5 b/l discolored, thick, dystrophic with subungual debris and pain with palpation to nailbeds due to thickness of nails.  Hyperkeratotic lesion submet head 5 left foot with tenderness to palpation. No edema, no erythema, no drainage.  Musculoskeletal: Muscle strength 5/5 to all LE muscle groups  No gross bony deformities b/l.  No pain, crepitus or joint limitation noted with ROM.   Neurological: Sensation diminished with 10 gram monofilament. Vibratory sensation diminished.   Assessment: 1. Painful onychomycosis toenails 1-5 b/l  2. Callus submet head 5 left, submet 1 right 3. NIDDM with neuropathy  Plan: 1. Toenails 1-5 b/l were debrided in length and girth without iatrogenic bleeding. 2. Calluses pared submet head 5 left, submet head 1 right with sterile scalpel without  incident. 3. Patient to continue soft, supportive shoe gear. Will research benefits for orthotics. I don't think they will be covered, but he does qualify for diabetic shoes. Will discuss with [facility]. 4. Patient to report any pedal injuries to medical professional immediately. 5. Follow up 4 weeks for non-eligible foot care visit.  6. Patient/POA to call should there be a concern in the interim.

## 2018-05-22 DIAGNOSIS — E291 Testicular hypofunction: Secondary | ICD-10-CM | POA: Diagnosis not present

## 2018-05-27 DIAGNOSIS — G2581 Restless legs syndrome: Secondary | ICD-10-CM | POA: Diagnosis not present

## 2018-05-27 DIAGNOSIS — G4733 Obstructive sleep apnea (adult) (pediatric): Secondary | ICD-10-CM | POA: Diagnosis not present

## 2018-05-28 DIAGNOSIS — C679 Malignant neoplasm of bladder, unspecified: Secondary | ICD-10-CM | POA: Diagnosis not present

## 2018-05-28 DIAGNOSIS — R310 Gross hematuria: Secondary | ICD-10-CM | POA: Diagnosis not present

## 2018-05-28 DIAGNOSIS — R319 Hematuria, unspecified: Secondary | ICD-10-CM | POA: Diagnosis not present

## 2018-05-28 DIAGNOSIS — N3289 Other specified disorders of bladder: Secondary | ICD-10-CM | POA: Diagnosis not present

## 2018-05-28 DIAGNOSIS — R8290 Unspecified abnormal findings in urine: Secondary | ICD-10-CM | POA: Diagnosis not present

## 2018-05-29 ENCOUNTER — Ambulatory Visit: Payer: Medicare Other | Admitting: Orthotics

## 2018-06-05 DIAGNOSIS — E291 Testicular hypofunction: Secondary | ICD-10-CM | POA: Diagnosis not present

## 2018-06-15 ENCOUNTER — Ambulatory Visit: Payer: Medicare Other | Admitting: Podiatry

## 2018-06-15 DIAGNOSIS — L821 Other seborrheic keratosis: Secondary | ICD-10-CM | POA: Diagnosis not present

## 2018-06-15 DIAGNOSIS — C44519 Basal cell carcinoma of skin of other part of trunk: Secondary | ICD-10-CM | POA: Diagnosis not present

## 2018-06-15 DIAGNOSIS — B0089 Other herpesviral infection: Secondary | ICD-10-CM | POA: Diagnosis not present

## 2018-06-15 DIAGNOSIS — D225 Melanocytic nevi of trunk: Secondary | ICD-10-CM | POA: Diagnosis not present

## 2018-06-15 DIAGNOSIS — L570 Actinic keratosis: Secondary | ICD-10-CM | POA: Diagnosis not present

## 2018-06-15 DIAGNOSIS — Z85828 Personal history of other malignant neoplasm of skin: Secondary | ICD-10-CM | POA: Diagnosis not present

## 2018-06-15 DIAGNOSIS — D1801 Hemangioma of skin and subcutaneous tissue: Secondary | ICD-10-CM | POA: Diagnosis not present

## 2018-06-16 ENCOUNTER — Ambulatory Visit (INDEPENDENT_AMBULATORY_CARE_PROVIDER_SITE_OTHER): Payer: Medicare Other | Admitting: Podiatry

## 2018-06-16 ENCOUNTER — Other Ambulatory Visit: Payer: Self-pay

## 2018-06-16 DIAGNOSIS — M79675 Pain in left toe(s): Secondary | ICD-10-CM

## 2018-06-16 DIAGNOSIS — M79674 Pain in right toe(s): Secondary | ICD-10-CM

## 2018-06-16 DIAGNOSIS — B351 Tinea unguium: Secondary | ICD-10-CM

## 2018-06-16 DIAGNOSIS — L84 Corns and callosities: Secondary | ICD-10-CM

## 2018-06-16 NOTE — Patient Instructions (Signed)
Diabetes Mellitus and Foot Care Foot care is an important part of your health, especially when you have diabetes. Diabetes may cause you to have problems because of poor blood flow (circulation) to your feet and legs, which can cause your skin to:  Become thinner and drier.  Break more easily.  Heal more slowly.  Peel and crack. You may also have nerve damage (neuropathy) in your legs and feet, causing decreased feeling in them. This means that you may not notice minor injuries to your feet that could lead to more serious problems. Noticing and addressing any potential problems early is the best way to prevent future foot problems. How to care for your feet Foot hygiene  Wash your feet daily with warm water and mild soap. Do not use hot water. Then, pat your feet and the areas between your toes until they are completely dry. Do not soak your feet as this can dry your skin.  Trim your toenails straight across. Do not dig under them or around the cuticle. File the edges of your nails with an emery board or nail file.  Apply a moisturizing lotion or petroleum jelly to the skin on your feet and to dry, brittle toenails. Use lotion that does not contain alcohol and is unscented. Do not apply lotion between your toes. Shoes and socks  Wear clean socks or stockings every day. Make sure they are not too tight. Do not wear knee-high stockings since they may decrease blood flow to your legs.  Wear shoes that fit properly and have enough cushioning. Always look in your shoes before you put them on to be sure there are no objects inside.  To break in new shoes, wear them for just a few hours a day. This prevents injuries on your feet. Wounds, scrapes, corns, and calluses  Check your feet daily for blisters, cuts, bruises, sores, and redness. If you cannot see the bottom of your feet, use a mirror or ask someone for help.  Do not cut corns or calluses or try to remove them with medicine.  If you  find a minor scrape, cut, or break in the skin on your feet, keep it and the skin around it clean and dry. You may clean these areas with mild soap and water. Do not clean the area with peroxide, alcohol, or iodine.  If you have a wound, scrape, corn, or callus on your foot, look at it several times a day to make sure it is healing and not infected. Check for: ? Redness, swelling, or pain. ? Fluid or blood. ? Warmth. ? Pus or a bad smell. General instructions  Do not cross your legs. This may decrease blood flow to your feet.  Do not use heating pads or hot water bottles on your feet. They may burn your skin. If you have lost feeling in your feet or legs, you may not know this is happening until it is too late.  Protect your feet from hot and cold by wearing shoes, such as at the beach or on hot pavement.  Schedule a complete foot exam at least once a year (annually) or more often if you have foot problems. If you have foot problems, report any cuts, sores, or bruises to your health care provider immediately. Contact a health care provider if:  You have a medical condition that increases your risk of infection and you have any cuts, sores, or bruises on your feet.  You have an injury that is not   healing.  You have redness on your legs or feet.  You feel burning or tingling in your legs or feet.  You have pain or cramps in your legs and feet.  Your legs or feet are numb.  Your feet always feel cold.  You have pain around a toenail. Get help right away if:  You have a wound, scrape, corn, or callus on your foot and: ? You have pain, swelling, or redness that gets worse. ? You have fluid or blood coming from the wound, scrape, corn, or callus. ? Your wound, scrape, corn, or callus feels warm to the touch. ? You have pus or a bad smell coming from the wound, scrape, corn, or callus. ? You have a fever. ? You have a red line going up your leg. Summary  Check your feet every day  for cuts, sores, red spots, swelling, and blisters.  Moisturize feet and legs daily.  Wear shoes that fit properly and have enough cushioning.  If you have foot problems, report any cuts, sores, or bruises to your health care provider immediately.  Schedule a complete foot exam at least once a year (annually) or more often if you have foot problems. This information is not intended to replace advice given to you by your health care provider. Make sure you discuss any questions you have with your health care provider. Document Released: 03/22/2000 Document Revised: 05/07/2017 Document Reviewed: 04/26/2016 Elsevier Interactive Patient Education  2019 [employer]  Onychomycosis/Fungal Toenails  WHAT IS IT? An infection that lies within the keratin of your nail plate that is caused by a fungus.  WHY ME? Fungal infections affect all ages, sexes, races, and creeds.  There may be many factors that predispose you to a fungal infection such as age, coexisting medical conditions such as diabetes, or an autoimmune disease; stress, medications, fatigue, genetics, etc.  Bottom line: fungus thrives in a warm, moist environment and your shoes offer such a location.  IS IT CONTAGIOUS? Theoretically, yes.  You do not want to share shoes, nail clippers or files with someone who has fungal toenails.  Walking around barefoot in the same room or sleeping in the same bed is unlikely to transfer the organism.  It is important to realize, however, that fungus can spread easily from one nail to the next on the same foot.  HOW DO WE TREAT THIS?  There are several ways to treat this condition.  Treatment may depend on many factors such as age, medications, pregnancy, liver and kidney conditions, etc.  It is best to ask your doctor which options are available to you.  1. No treatment.   Unlike many other medical concerns, you can live with this condition.  However for many people this can be a painful condition and  may lead to ingrown toenails or a bacterial infection.  It is recommended that you keep the nails cut short to help reduce the amount of fungal nail. 2. Topical treatment.  These range from herbal remedies to prescription strength nail lacquers.  About 40-50% effective, topicals require twice daily application for approximately 9 to 12 months or until an entirely new nail has grown out.  The most effective topicals are medical grade medications available through physicians offices. 3. Oral antifungal medications.  With an 80-90% cure rate, the most common oral medication requires 3 to 4 months of therapy and stays in your system for a year as the new nail grows out.  Oral antifungal medications do require   blood work to make sure it is a safe drug for you.  A liver function panel will be performed prior to starting the medication and after the first month of treatment.  It is important to have the blood work performed to avoid any harmful side effects.  In general, this medication safe but blood work is required. 4. Laser Therapy.  This treatment is performed by applying a specialized laser to the affected nail plate.  This therapy is noninvasive, fast, and non-painful.  It is not covered by insurance and is therefore, out of pocket.  The results have been very good with a 80-95% cure rate.  [facility] is the only practice in the area to offer this therapy. 5. Permanent Nail Avulsion.  Removing the entire nail so that a new nail will not grow back. 

## 2018-06-17 DIAGNOSIS — R8290 Unspecified abnormal findings in urine: Secondary | ICD-10-CM | POA: Diagnosis not present

## 2018-06-17 DIAGNOSIS — R3914 Feeling of incomplete bladder emptying: Secondary | ICD-10-CM | POA: Diagnosis not present

## 2018-06-17 DIAGNOSIS — C679 Malignant neoplasm of bladder, unspecified: Secondary | ICD-10-CM | POA: Diagnosis not present

## 2018-06-19 DIAGNOSIS — E291 Testicular hypofunction: Secondary | ICD-10-CM | POA: Diagnosis not present

## 2018-06-22 DIAGNOSIS — C679 Malignant neoplasm of bladder, unspecified: Secondary | ICD-10-CM | POA: Diagnosis not present

## 2018-06-25 NOTE — Progress Notes (Signed)
Subjective: Jason Hartman presents with diabetes, diabetic neuropathy and cc of painful, discolored, thick toenails and painful calluses which interfere with activities of daily living. Pain is aggravated when wearing enclosed shoe gear. Pain is relieved with periodic professional debridement.  Kalish, Michael, MD his PCP.   Current Outpatient Medications:  .  acetaminophen (TYLENOL) 500 MG tablet, Take by mouth., Disp: , Rfl:  .  acyclovir (ZOVIRAX) 800 MG tablet, Take by mouth., Disp: , Rfl:  .  allopurinol (ZYLOPRIM) 100 MG tablet, Take 2 tablets daily., Disp: 60 tablet, Rfl: 0 .  allopurinol (ZYLOPRIM) 100 MG tablet, TAKE 2 TABLETS BY MOUTH EVERY DAY, Disp: 60 tablet, Rfl: 0 .  allopurinol (ZYLOPRIM) 100 MG tablet, Take 1 tablet (100 mg total) by mouth daily., Disp: 60 tablet, Rfl: 0 .  allopurinol (ZYLOPRIM) 300 MG tablet, TK 1 T PO D UTD, Disp: , Rfl:  .  ALPRAZolam (XANAX) 0.5 MG tablet, Take 1 tab twice a day as needed for anxiety, Disp: , Rfl:  .  amantadine (SYMMETREL) 100 MG capsule, TK 1 C PO QHS FOR 3 DAYS THEN TK 1 C PO BID, Disp: , Rfl:  .  amLODipine (NORVASC) 5 MG tablet, , Disp: , Rfl:  .  aspirin EC 325 MG tablet, Take 325 mg by mouth daily. 1-2 daily prn, Disp: , Rfl:  .  BYSTOLIC 5 MG tablet, , Disp: , Rfl:  .  cetirizine (ZYRTEC) 10 MG tablet, Take by mouth., Disp: , Rfl:  .  chlorthalidone (HYGROTON) 25 MG tablet, Take by mouth., Disp: , Rfl:  .  ciprofloxacin (CIPRO) 500 MG tablet, TK 1 T PO BID, Disp: , Rfl: 0 .  clonazePAM (KLONOPIN) 0.5 MG tablet, Take 1 tab twice a day consistently, and 1 extra a day if needed (to replace xanax), Disp: , Rfl:  .  clotrimazole-betamethasone (LOTRISONE) cream, Apply to affected area twice daily, Disp: , Rfl:  .  co-enzyme Q-10 50 MG capsule, Take 50 mg by mouth daily., Disp: , Rfl:  .  colchicine 0.6 MG tablet, Take 1 tablet (0.6 mg total) by mouth daily., Disp: 10 tablet, Rfl: 0 .  colchicine 0.6 MG tablet, Take 1 tablet (0.6 mg  total) by mouth daily., Disp: 10 tablet, Rfl: 0 .  desonide (DESOWEN) 0.05 % cream, Apply topically., Disp: , Rfl:  .  diclofenac sodium (VOLTAREN) 1 % GEL, Apply topically., Disp: , Rfl:  .  DIOVAN 320 MG tablet, , Disp: , Rfl:  .  DOCOSAHEXAENOIC ACID PO, Take 1 g by mouth., Disp: , Rfl:  .  famotidine (PEPCID) 40 MG tablet, Take by mouth., Disp: , Rfl:  .  ferrous sulfate 325 (65 FE) MG EC tablet, , Disp: , Rfl:  .  fluconazole (DIFLUCAN) 200 MG tablet, Take by mouth., Disp: , Rfl:  .  Flunisolide HFA (AEROSPAN) 80 MCG/ACT AERS, Inhale into the lungs., Disp: , Rfl:  .  fluticasone (FLONASE) 50 MCG/ACT nasal spray, Place into the nose., Disp: , Rfl:  .  fluvoxaMINE (LUVOX) 25 MG tablet, , Disp: , Rfl: 5 .  gabapentin (NEURONTIN) 600 MG tablet, TAKE 2 TABLETS BY MOUTH THREE TIMES DAILY, Disp: , Rfl:  .  Gabapentin Enacarbil (HORIZANT) 600 MG TB24, Take 600 mg by mouth 2 (two) times daily., Disp: 180 tablet, Rfl: 3 .  hydrochlorothiazide (HYDRODIURIL) 12.5 MG tablet, TK 1 T PO QD IN THE MORNING, Disp: , Rfl: 11 .  HYDROcodone-acetaminophen (NORCO) 10-325 MG per tablet, Take 1 tablet by mouth   every 4 (four) hours as needed (for pain)., Disp: 30 tablet, Rfl: 0 .  HYDROcodone-acetaminophen (NORCO/VICODIN) 5-325 MG per tablet, Take 1-2 tablets by mouth daily. , Disp: , Rfl:  .  ketorolac (TORADOL) 10 MG tablet, Take 1 tablet (10 mg total) by mouth every 6 (six) hours as needed for moderate pain., Disp: 20 tablet, Rfl: 0 .  L-METHYLFOLATE CALCIUM PO, Take by mouth., Disp: , Rfl:  .  L-Methylfolate-Algae-B12-B6 (METANX) 3-90.314-2-35 MG CAPS, Take by mouth., Disp: , Rfl:  .  LANTUS SOLOSTAR 100 UNIT/ML SOPN, , Disp: , Rfl:  .  liraglutide (VICTOZA) 18 MG/3ML SOPN, Inject into the skin., Disp: , Rfl:  .  magnesium citrate SOLN, Take by mouth., Disp: , Rfl:  .  magnesium citrate SOLN, Take by mouth., Disp: , Rfl:  .  meloxicam (MOBIC) 15 MG tablet, Take 1 tablet (15 mg total) by mouth daily., Disp: 10  tablet, Rfl: 0 .  metFORMIN (GLUCOPHAGE-XR) 500 MG 24 hr tablet, , Disp: , Rfl:  .  multivitamin (METANX) 3-35-2 MG TABS tablet, Take 1 tablet by mouth 2 (two) times daily., Disp: 180 tablet, Rfl: 3 .  nitroGLYCERIN (NITROSTAT) 0.4 MG SL tablet, Place under the tongue., Disp: , Rfl:  .  NONFORMULARY OR COMPOUNDED ITEM, [facility]:  Antiinflammatory cream - Diclofenac 3%, Baclofen 2%, Lidocaine 2%, apply 1-2 grams to affected area 3-4 times daily., Disp: 120 each, Rfl: 2 .  NOVOFINE 32G X 6 MM MISC, , Disp: , Rfl:  .  olmesartan (BENICAR) 40 MG tablet, TAKE 1 TABLET(40 MG) BY MOUTH DAILY, Disp: , Rfl:  .  Omega-3 1000 MG CAPS, Take by mouth., Disp: , Rfl:  .  omega-3 acid ethyl esters (LOVAZA) 1 G capsule, Take 2 g by mouth 2 (two) times daily. 4 Tabs qd, Disp: , Rfl:  .  ONE TOUCH ULTRA TEST test strip, , Disp: , Rfl:  .  ONETOUCH DELICA LANCETS 33G MISC, , Disp: , Rfl:  .  oxybutynin (DITROPAN) 5 MG tablet, TK 1 T PO BID, Disp: , Rfl:  .  OXYCONTIN 10 MG 12 hr tablet, TK 1 T PO Q 12 H FOR 5 DAYS, Disp: , Rfl:  .  Polyethylene Glycol 3350 (PEG 3350) POWD, Take by mouth., Disp: , Rfl:  .  predniSONE (DELTASONE) 5 MG tablet, , Disp: , Rfl: 0 .  rotigotine (NEUPRO) 6 MG/24HR, Place 1 patch onto the skin daily., Disp: 90 patch, Rfl: 3 .  STENDRA 200 MG TABS, as needed., Disp: , Rfl:  .  tamsulosin (FLOMAX) 0.4 MG CAPS capsule, Take one capsule daily until stone passes., Disp: 30 capsule, Rfl: 0 .  terazosin (HYTRIN) 2 MG capsule, Take by mouth., Disp: , Rfl:  .  testosterone cypionate (DEPOTESTOTERONE CYPIONATE) 100 MG/ML injection, Inject into the muscle., Disp: , Rfl:  .  triamcinolone lotion (KENALOG) 0.1 %, , Disp: , Rfl:  .  Ubiquinol 100 MG CAPS, Take by mouth., Disp: , Rfl:  .  Urea 39 % CREA, Apply topically as needed, Disp: , Rfl:  .  Vilazodone HCl (VIIBRYD) 10 MG TABS, Take 1/2 a day for a week, then 1 a day, Disp: , Rfl:   Allergies  Allergen Reactions  . Atorvastatin  Itching and Other (See Comments)    Tired, weakness Tired, weakness TIRED Tired, weakness   . Colesevelam Other (See Comments)    tired TIRED tired   . Iodinated Diagnostic Agents Other (See Comments)    HIVES HIVES   .   Iohexol      Code: HIVES, Desc: alleric to renografin,isovue & omnipaque, hives, requires 13 hr prep//a.calhoun, Onset Date: [id number]   . Lovastatin Other (See Comments)    Tired, nervousness TIRED Tired, nervousness   . Methadone Hcl Other (See Comments)    HALLUCINATIONS HALLUCINATIONS   . Morphine Sulfate   . Other Other (See Comments)    HIVES  . Oxycodone Hcl   . Penicillins   . Promethazine Other (See Comments)    Mental status change DELIRIUM Mental status change   . Quetiapine   . Rosuvastatin Other (See Comments)    Tired, weakness TIRED Tired, weakness   . Zolpidem Other (See Comments)  . Carbidopa-Levodopa Anxiety  . Clindamycin Rash  . Doxazosin Rash  . Iodine Other (See Comments) and Rash    HIVES  . Linezolid Other (See Comments) and Rash    HIVES     Vascular Examination: Capillary refill time <3 seconds x 10 digits.  Dorsalis pedis and Posterior tibial pulses present b/l.  Digital hair present x 10 digits.  Skin temperature within normal limits bilaterally.  Dermatological Examination: Skin with normal turgor, texture and tone b/l.  Toenails 1-5 b/l discolored, thick, dystrophic with subungual debris and pain with palpation to nailbeds due to thickness of nails.  Hyperkeratotic lesion submet head 5 left foot, sub-met head 1 right foot with tenderness to palpation. No edema, no erythema, no drainage, no flocculence.  Musculoskeletal: Muscle strength 5/5 to all LE muscle groups  Neurological: Sensation diminished with 10 gram monofilament.  Vibratory sensation diminished  Assessment: 1. Painful onychomycosis toenails 1-5 b/l 2. Calluses submetatarsal head 5 left foot, sub-met head 1 right foot 3. NIDDM with  Diabetic neuropathy  Plan: 1. Continue diabetic foot care principles.  Literature dispensed on today. 2. Toenails 1-5 b/l were debrided in length and girth without iatrogenic bleeding. Calluses pared submetatarsal head(s) 1 right foot, submetatarsal head 5 left foot utilizing sterile scalpel blade without incident.  Patient to continue felt callus pads daily. 3. Patient to continue soft, supportive shoe gear daily. 4. Patient to report any pedal injuries to medical professional immediately. 5. Follow up 3 months.  6. Patient/POA to call should there be a concern in the interim. 

## 2018-06-26 ENCOUNTER — Encounter: Payer: Self-pay | Admitting: Podiatry

## 2018-06-30 DIAGNOSIS — E291 Testicular hypofunction: Secondary | ICD-10-CM | POA: Diagnosis not present

## 2018-06-30 DIAGNOSIS — C679 Malignant neoplasm of bladder, unspecified: Secondary | ICD-10-CM | POA: Diagnosis not present

## 2018-07-02 DIAGNOSIS — M19012 Primary osteoarthritis, left shoulder: Secondary | ICD-10-CM | POA: Diagnosis not present

## 2018-07-13 ENCOUNTER — Other Ambulatory Visit: Payer: Self-pay

## 2018-07-13 ENCOUNTER — Ambulatory Visit (INDEPENDENT_AMBULATORY_CARE_PROVIDER_SITE_OTHER): Payer: Medicare Other | Admitting: Podiatry

## 2018-07-13 ENCOUNTER — Encounter: Payer: Self-pay | Admitting: Podiatry

## 2018-07-13 VITALS — Temp 97.7°F

## 2018-07-13 DIAGNOSIS — E1142 Type 2 diabetes mellitus with diabetic polyneuropathy: Secondary | ICD-10-CM

## 2018-07-13 DIAGNOSIS — L84 Corns and callosities: Secondary | ICD-10-CM

## 2018-07-13 DIAGNOSIS — Q828 Other specified congenital malformations of skin: Secondary | ICD-10-CM | POA: Diagnosis not present

## 2018-07-13 DIAGNOSIS — M79674 Pain in right toe(s): Secondary | ICD-10-CM

## 2018-07-13 DIAGNOSIS — M79675 Pain in left toe(s): Secondary | ICD-10-CM

## 2018-07-15 NOTE — Progress Notes (Addendum)
Subjective: Jason Hartman is a 83 y.o. y.o. male who presents today with painful callus plantar aspect right foot which interferes with daily activities. Pain is aggravated when weightbearing and relieved with periodic professional debridement.  His wife is present during today's visit. Their daughter sent them N95 masks and he says he cannot wear it because he is claustrophobic.  He relates the felt callus pads really make his left foot more tolerable when weightbearing.  [name], MD is his PCP.   Current Outpatient Medications:  .  acetaminophen (TYLENOL) 500 MG tablet, Take by mouth., Disp: , Rfl:  .  acyclovir (ZOVIRAX) 800 MG tablet, Take by mouth., Disp: , Rfl:  .  allopurinol (ZYLOPRIM) 100 MG tablet, Take 2 tablets daily., Disp: 60 tablet, Rfl: 0 .  allopurinol (ZYLOPRIM) 100 MG tablet, TAKE 2 TABLETS BY MOUTH EVERY DAY, Disp: 60 tablet, Rfl: 0 .  allopurinol (ZYLOPRIM) 100 MG tablet, Take 1 tablet (100 mg total) by mouth daily., Disp: 60 tablet, Rfl: 0 .  allopurinol (ZYLOPRIM) 300 MG tablet, TK 1 T PO D UTD, Disp: , Rfl:  .  ALPRAZolam (XANAX) 0.5 MG tablet, Take 1 tab twice a day as needed for anxiety, Disp: , Rfl:  .  amantadine (SYMMETREL) 100 MG capsule, TK 1 C PO QHS FOR 3 DAYS THEN TK 1 C PO BID, Disp: , Rfl:  .  amLODipine (NORVASC) 5 MG tablet, , Disp: , Rfl:  .  aspirin EC 325 MG tablet, Take 325 mg by mouth daily. 1-2 daily prn, Disp: , Rfl:  .  BYSTOLIC 5 MG tablet, , Disp: , Rfl:  .  cetirizine (ZYRTEC) 10 MG tablet, Take by mouth., Disp: , Rfl:  .  chlorthalidone (HYGROTON) 25 MG tablet, Take by mouth., Disp: , Rfl:  .  ciprofloxacin (CIPRO) 500 MG tablet, TK 1 T PO BID, Disp: , Rfl: 0 .  clonazePAM (KLONOPIN) 0.5 MG tablet, Take 1 tab twice a day consistently, and 1 extra a day if needed (to replace xanax), Disp: , Rfl:  .  clotrimazole-betamethasone (LOTRISONE) cream, Apply to affected area twice daily, Disp: , Rfl:  .  co-enzyme Q-10 50 MG capsule, Take 50  mg by mouth daily., Disp: , Rfl:  .  colchicine 0.6 MG tablet, Take 1 tablet (0.6 mg total) by mouth daily., Disp: 10 tablet, Rfl: 0 .  colchicine 0.6 MG tablet, Take 1 tablet (0.6 mg total) by mouth daily., Disp: 10 tablet, Rfl: 0 .  desonide (DESOWEN) 0.05 % cream, Apply topically., Disp: , Rfl:  .  diclofenac sodium (VOLTAREN) 1 % GEL, Apply topically., Disp: , Rfl:  .  DIOVAN 320 MG tablet, , Disp: , Rfl:  .  DOCOSAHEXAENOIC ACID PO, Take 1 g by mouth., Disp: , Rfl:  .  famotidine (PEPCID) 40 MG tablet, Take by mouth., Disp: , Rfl:  .  ferrous sulfate 325 (65 FE) MG EC tablet, , Disp: , Rfl:  .  fluconazole (DIFLUCAN) 200 MG tablet, Take by mouth., Disp: , Rfl:  .  Flunisolide HFA (AEROSPAN) 80 MCG/ACT AERS, Inhale into the lungs., Disp: , Rfl:  .  fluticasone (FLONASE) 50 MCG/ACT nasal spray, Place into the nose., Disp: , Rfl:  .  fluvoxaMINE (LUVOX) 25 MG tablet, , Disp: , Rfl: 5 .  gabapentin (NEURONTIN) 600 MG tablet, TAKE 2 TABLETS BY MOUTH THREE TIMES DAILY, Disp: , Rfl:  .  Gabapentin Enacarbil (HORIZANT) 600 MG TB24, Take 600 mg by mouth 2 (two) times daily.,  Disp: 180 tablet, Rfl: 3 .  hydrochlorothiazide (HYDRODIURIL) 12.5 MG tablet, TK 1 T PO QD IN THE MORNING, Disp: , Rfl: 11 .  HYDROcodone-acetaminophen (NORCO) 10-325 MG per tablet, Take 1 tablet by mouth every 4 (four) hours as needed (for pain)., Disp: 30 tablet, Rfl: 0 .  HYDROcodone-acetaminophen (NORCO/VICODIN) 5-325 MG per tablet, Take 1-2 tablets by mouth daily. , Disp: , Rfl:  .  ketorolac (TORADOL) 10 MG tablet, Take 1 tablet (10 mg total) by mouth every 6 (six) hours as needed for moderate pain., Disp: 20 tablet, Rfl: 0 .  L-METHYLFOLATE CALCIUM PO, Take by mouth., Disp: , Rfl:  .  L-Methylfolate-Algae-B12-B6 (METANX) 3-90.314-2-35 MG CAPS, Take by mouth., Disp: , Rfl:  .  LANTUS SOLOSTAR 100 UNIT/ML SOPN, , Disp: , Rfl:  .  liraglutide (VICTOZA) 18 MG/3ML SOPN, Inject into the skin., Disp: , Rfl:  .  magnesium  citrate SOLN, Take by mouth., Disp: , Rfl:  .  magnesium citrate SOLN, Take by mouth., Disp: , Rfl:  .  meloxicam (MOBIC) 15 MG tablet, Take 1 tablet (15 mg total) by mouth daily., Disp: 10 tablet, Rfl: 0 .  metFORMIN (GLUCOPHAGE-XR) 500 MG 24 hr tablet, , Disp: , Rfl:  .  multivitamin (METANX) 3-35-2 MG TABS tablet, Take 1 tablet by mouth 2 (two) times daily., Disp: 180 tablet, Rfl: 3 .  nitroGLYCERIN (NITROSTAT) 0.4 MG SL tablet, Place under the tongue., Disp: , Rfl:  .  NONFORMULARY OR COMPOUNDED ITEM, [facility]:  Antiinflammatory cream - Diclofenac 3%, Baclofen 2%, Lidocaine 2%, apply 1-2 grams to affected area 3-4 times daily., Disp: 120 each, Rfl: 2 .  NOVOFINE 32G X 6 MM MISC, , Disp: , Rfl:  .  olmesartan (BENICAR) 40 MG tablet, TAKE 1 TABLET(40 MG) BY MOUTH DAILY, Disp: , Rfl:  .  Omega-3 1000 MG CAPS, Take by mouth., Disp: , Rfl:  .  omega-3 acid ethyl esters (LOVAZA) 1 G capsule, Take 2 g by mouth 2 (two) times daily. 4 Tabs qd, Disp: , Rfl:  .  ONE TOUCH ULTRA TEST test strip, , Disp: , Rfl:  .  ONETOUCH DELICA LANCETS 40G MISC, , Disp: , Rfl:  .  oxybutynin (DITROPAN) 5 MG tablet, TK 1 T PO BID, Disp: , Rfl:  .  OXYCONTIN 10 MG 12 hr tablet, TK 1 T PO Q 12 H FOR 5 DAYS, Disp: , Rfl:  .  Polyethylene Glycol 3350 (PEG 3350) POWD, Take by mouth., Disp: , Rfl:  .  predniSONE (DELTASONE) 5 MG tablet, , Disp: , Rfl: 0 .  rotigotine (NEUPRO) 6 MG/24HR, Place 1 patch onto the skin daily., Disp: 90 patch, Rfl: 3 .  STENDRA 200 MG TABS, as needed., Disp: , Rfl:  .  tamsulosin (FLOMAX) 0.4 MG CAPS capsule, Take one capsule daily until stone passes., Disp: 30 capsule, Rfl: 0 .  terazosin (HYTRIN) 2 MG capsule, Take by mouth., Disp: , Rfl:  .  testosterone cypionate (DEPOTESTOTERONE CYPIONATE) 100 MG/ML injection, Inject into the muscle., Disp: , Rfl:  .  triamcinolone lotion (KENALOG) 0.1 %, , Disp: , Rfl:  .  Ubiquinol 100 MG CAPS, Take by mouth., Disp: , Rfl:  .  Urea 39 % CREA,  Apply topically as needed, Disp: , Rfl:  .  Vilazodone HCl (VIIBRYD) 10 MG TABS, Take 1/2 a day for a week, then 1 a day, Disp: , Rfl:   Allergies  Allergen Reactions  . Atorvastatin Itching and Other (See Comments)  Tired, weakness Tired, weakness TIRED Tired, weakness   . Colesevelam Other (See Comments)    tired TIRED tired   . Iodinated Diagnostic Agents Other (See Comments)    HIVES HIVES   . Iohexol      Code: HIVES, Desc: alleric to renografin,isovue & omnipaque, hives, requires 13 hr prep//a.calhoun, Onset Date: [id number]   . Lovastatin Other (See Comments)    Tired, nervousness TIRED Tired, nervousness   . Methadone Hcl Other (See Comments)    HALLUCINATIONS HALLUCINATIONS   . Morphine Sulfate   . Other Other (See Comments)    HIVES  . Oxycodone Hcl   . Penicillins   . Promethazine Other (See Comments)    Mental status change DELIRIUM Mental status change   . Quetiapine   . Rosuvastatin Other (See Comments)    Tired, weakness TIRED Tired, weakness   . Zolpidem Other (See Comments)  . Carbidopa-Levodopa Anxiety  . Clindamycin Rash  . Doxazosin Rash  . Iodine Other (See Comments) and Rash    HIVES  . Linezolid Other (See Comments) and Rash    HIVES     Objective:  Vascular Examination: Capillary refill time <3 seconds x 10 digits.  Dorsalis pedis pulses palpable b/l.  Posterior tibial pulses palpable b/l.  Digital hair present x 10 digits.  Skin temperature gradient WNL b/l.  Dermatological Examination: Skin with normal turgor, texture and tone b/l.  Toenails 1-5 b/l discolored, thick, dystrophic with subungual debris and pain with palpation to nailbeds due to thickness of nails. Adequate length today.  Hyperkeratotic lesion right hallux. No erythema, no edema, no drainage, no flocculence noted.  Porokeratotic lesion submet head 5 left foot with tenderness to palpation. No erythema, no edema, no drainage, no flocculence.    Musculoskeletal: Muscle strength 5/5 to all LE muscle groups  Neurological: Sensation diminished with 10 gram monofilament.  Vibratory sensation diminished.  Assessment: Painful porokeratotic lesion submet head 5 left foot Callus right hallux Diabetic neuropathy   Plan: 1. Continue diabetic foot care principles. Literature dispensed on today. 2. Hyperkeratotic lesion(s) right hallux pared with sterile scalpel blade without incident. 3. Porokeratosis submet head 5 left foot pared and enucleated with sterile scalpel blade without incident. 4. Patient to continue soft, supportive shoe gear daily. 5. Patient to report any pedal injuries to medical professional immediately. 6. Follow up 5 weeks.  7. Patient/POA to call should there be a concern in the interim.

## 2018-07-16 DIAGNOSIS — E291 Testicular hypofunction: Secondary | ICD-10-CM | POA: Diagnosis not present

## 2018-07-16 DIAGNOSIS — C679 Malignant neoplasm of bladder, unspecified: Secondary | ICD-10-CM | POA: Diagnosis not present

## 2018-07-23 DIAGNOSIS — C679 Malignant neoplasm of bladder, unspecified: Secondary | ICD-10-CM | POA: Diagnosis not present

## 2018-07-31 DIAGNOSIS — C679 Malignant neoplasm of bladder, unspecified: Secondary | ICD-10-CM | POA: Diagnosis not present

## 2018-08-07 DIAGNOSIS — E291 Testicular hypofunction: Secondary | ICD-10-CM | POA: Diagnosis not present

## 2018-08-07 DIAGNOSIS — C679 Malignant neoplasm of bladder, unspecified: Secondary | ICD-10-CM | POA: Diagnosis not present

## 2018-08-17 ENCOUNTER — Ambulatory Visit (INDEPENDENT_AMBULATORY_CARE_PROVIDER_SITE_OTHER): Payer: Medicare Other | Admitting: Podiatry

## 2018-08-17 ENCOUNTER — Encounter: Payer: Self-pay | Admitting: Podiatry

## 2018-08-17 ENCOUNTER — Other Ambulatory Visit: Payer: Self-pay

## 2018-08-17 VITALS — Temp 97.7°F

## 2018-08-17 DIAGNOSIS — Q828 Other specified congenital malformations of skin: Secondary | ICD-10-CM

## 2018-08-17 DIAGNOSIS — E1142 Type 2 diabetes mellitus with diabetic polyneuropathy: Secondary | ICD-10-CM | POA: Diagnosis not present

## 2018-08-17 DIAGNOSIS — B351 Tinea unguium: Secondary | ICD-10-CM | POA: Diagnosis not present

## 2018-08-17 DIAGNOSIS — L84 Corns and callosities: Secondary | ICD-10-CM | POA: Diagnosis not present

## 2018-08-17 DIAGNOSIS — M79674 Pain in right toe(s): Secondary | ICD-10-CM | POA: Diagnosis not present

## 2018-08-17 DIAGNOSIS — M79675 Pain in left toe(s): Secondary | ICD-10-CM

## 2018-08-17 NOTE — Patient Instructions (Signed)

## 2018-08-21 DIAGNOSIS — C675 Malignant neoplasm of bladder neck: Secondary | ICD-10-CM | POA: Diagnosis not present

## 2018-08-21 DIAGNOSIS — E291 Testicular hypofunction: Secondary | ICD-10-CM | POA: Diagnosis not present

## 2018-08-23 ENCOUNTER — Encounter: Payer: Self-pay | Admitting: Podiatry

## 2018-08-23 NOTE — Progress Notes (Signed)
Subjective: Jason Hartman presents to clinic today with painful, thick toenails 1-5 b/l that he cannot cut and which interfere with daily activities.  Jason Hartman also has chronic painful callus plantar aspect left foot. Today, he states it was really sore on yesterday. He removed his callus pad and rubbed Aspercreme on it and it resolved.    Jason Manes, MD is his PCP. Last visit was January 2020.   Current Outpatient Medications:  .  acetaminophen (TYLENOL) 500 MG tablet, Take by mouth., Disp: , Rfl:  .  acyclovir (ZOVIRAX) 800 MG tablet, Take by mouth., Disp: , Rfl:  .  allopurinol (ZYLOPRIM) 100 MG tablet, Take 2 tablets daily., Disp: 60 tablet, Rfl: 0 .  allopurinol (ZYLOPRIM) 100 MG tablet, TAKE 2 TABLETS BY MOUTH EVERY DAY, Disp: 60 tablet, Rfl: 0 .  allopurinol (ZYLOPRIM) 100 MG tablet, Take 1 tablet (100 mg total) by mouth daily., Disp: 60 tablet, Rfl: 0 .  allopurinol (ZYLOPRIM) 300 MG tablet, TK 1 T PO D UTD, Disp: , Rfl:  .  ALPRAZolam (XANAX) 0.5 MG tablet, Take 1 tab twice a day as needed for anxiety, Disp: , Rfl:  .  amantadine (SYMMETREL) 100 MG capsule, TK 1 C PO QHS FOR 3 DAYS THEN TK 1 C PO BID, Disp: , Rfl:  .  amLODipine (NORVASC) 5 MG tablet, , Disp: , Rfl:  .  aspirin EC 325 MG tablet, Take 325 mg by mouth daily. 1-2 daily prn, Disp: , Rfl:  .  BYSTOLIC 5 MG tablet, , Disp: , Rfl:  .  cetirizine (ZYRTEC) 10 MG tablet, Take by mouth., Disp: , Rfl:  .  chlorthalidone (HYGROTON) 25 MG tablet, Take by mouth., Disp: , Rfl:  .  ciprofloxacin (CIPRO) 500 MG tablet, TK 1 T PO BID, Disp: , Rfl: 0 .  clonazePAM (KLONOPIN) 0.5 MG tablet, Take 1 tab twice a day consistently, and 1 extra a day if needed (to replace xanax), Disp: , Rfl:  .  clotrimazole-betamethasone (LOTRISONE) cream, Apply to affected area twice daily, Disp: , Rfl:  .  co-enzyme Q-10 50 MG capsule, Take 50 mg by mouth daily., Disp: , Rfl:  .  colchicine 0.6 MG tablet, Take 1 tablet (0.6 mg total) by mouth daily.,  Disp: 10 tablet, Rfl: 0 .  colchicine 0.6 MG tablet, Take 1 tablet (0.6 mg total) by mouth daily., Disp: 10 tablet, Rfl: 0 .  desonide (DESOWEN) 0.05 % cream, Apply topically., Disp: , Rfl:  .  diclofenac sodium (VOLTAREN) 1 % GEL, Apply topically., Disp: , Rfl:  .  DIOVAN 320 MG tablet, , Disp: , Rfl:  .  DOCOSAHEXAENOIC ACID PO, Take 1 g by mouth., Disp: , Rfl:  .  famotidine (PEPCID) 40 MG tablet, Take by mouth., Disp: , Rfl:  .  ferrous sulfate 325 (65 FE) MG EC tablet, , Disp: , Rfl:  .  fluconazole (DIFLUCAN) 200 MG tablet, Take by mouth., Disp: , Rfl:  .  Flunisolide HFA (AEROSPAN) 80 MCG/ACT AERS, Inhale into the lungs., Disp: , Rfl:  .  fluticasone (FLONASE) 50 MCG/ACT nasal spray, Place into the nose., Disp: , Rfl:  .  fluvoxaMINE (LUVOX) 25 MG tablet, , Disp: , Rfl: 5 .  gabapentin (NEURONTIN) 600 MG tablet, TAKE 2 TABLETS BY MOUTH THREE TIMES DAILY, Disp: , Rfl:  .  Gabapentin Enacarbil (HORIZANT) 600 MG TB24, Take 600 mg by mouth 2 (two) times daily., Disp: 180 tablet, Rfl: 3 .  hydrochlorothiazide (HYDRODIURIL) 12.5 MG tablet,  TK 1 T PO QD IN THE MORNING, Disp: , Rfl: 11 .  HYDROcodone-acetaminophen (NORCO) 10-325 MG per tablet, Take 1 tablet by mouth every 4 (four) hours as needed (for pain)., Disp: 30 tablet, Rfl: 0 .  HYDROcodone-acetaminophen (NORCO/VICODIN) 5-325 MG per tablet, Take 1-2 tablets by mouth daily. , Disp: , Rfl:  .  ketorolac (TORADOL) 10 MG tablet, Take 1 tablet (10 mg total) by mouth every 6 (six) hours as needed for moderate pain., Disp: 20 tablet, Rfl: 0 .  L-METHYLFOLATE CALCIUM PO, Take by mouth., Disp: , Rfl:  .  L-Methylfolate-Algae-B12-B6 (METANX) 3-90.314-2-35 MG CAPS, Take by mouth., Disp: , Rfl:  .  LANTUS SOLOSTAR 100 UNIT/ML SOPN, , Disp: , Rfl:  .  magnesium citrate SOLN, Take by mouth., Disp: , Rfl:  .  magnesium citrate SOLN, Take by mouth., Disp: , Rfl:  .  meloxicam (MOBIC) 15 MG tablet, Take 1 tablet (15 mg total) by mouth daily., Disp: 10  tablet, Rfl: 0 .  metFORMIN (GLUCOPHAGE-XR) 500 MG 24 hr tablet, , Disp: , Rfl:  .  multivitamin (METANX) 3-35-2 MG TABS tablet, Take 1 tablet by mouth 2 (two) times daily., Disp: 180 tablet, Rfl: 3 .  nitroGLYCERIN (NITROSTAT) 0.4 MG SL tablet, Place under the tongue., Disp: , Rfl:  .  NONFORMULARY OR COMPOUNDED ITEM, [facility]:  Antiinflammatory cream - Diclofenac 3%, Baclofen 2%, Lidocaine 2%, apply 1-2 grams to affected area 3-4 times daily., Disp: 120 each, Rfl: 2 .  NOVOFINE 32G X 6 MM MISC, , Disp: , Rfl:  .  olmesartan (BENICAR) 40 MG tablet, TAKE 1 TABLET(40 MG) BY MOUTH DAILY, Disp: , Rfl:  .  Omega-3 1000 MG CAPS, Take by mouth., Disp: , Rfl:  .  omega-3 acid ethyl esters (LOVAZA) 1 G capsule, Take 2 g by mouth 2 (two) times daily. 4 Tabs qd, Disp: , Rfl:  .  ONE TOUCH ULTRA TEST test strip, , Disp: , Rfl:  .  ONETOUCH DELICA LANCETS 81E MISC, , Disp: , Rfl:  .  oxybutynin (DITROPAN) 5 MG tablet, TK 1 T PO BID, Disp: , Rfl:  .  OXYCONTIN 10 MG 12 hr tablet, TK 1 T PO Q 12 H FOR 5 DAYS, Disp: , Rfl:  .  Polyethylene Glycol 3350 (PEG 3350) POWD, Take by mouth., Disp: , Rfl:  .  predniSONE (DELTASONE) 5 MG tablet, , Disp: , Rfl: 0 .  rotigotine (NEUPRO) 6 MG/24HR, Place 1 patch onto the skin daily., Disp: 90 patch, Rfl: 3 .  STENDRA 200 MG TABS, as needed., Disp: , Rfl:  .  tamsulosin (FLOMAX) 0.4 MG CAPS capsule, Take one capsule daily until stone passes., Disp: 30 capsule, Rfl: 0 .  terazosin (HYTRIN) 2 MG capsule, Take by mouth., Disp: , Rfl:  .  testosterone cypionate (DEPOTESTOTERONE CYPIONATE) 100 MG/ML injection, Inject into the muscle., Disp: , Rfl:  .  triamcinolone lotion (KENALOG) 0.1 %, , Disp: , Rfl:  .  Ubiquinol 100 MG CAPS, Take by mouth., Disp: , Rfl:  .  Urea 39 % CREA, Apply topically as needed, Disp: , Rfl:  .  Vilazodone HCl (VIIBRYD) 10 MG TABS, Take 1/2 a day for a week, then 1 a day, Disp: , Rfl:  .  liraglutide (VICTOZA) 18 MG/3ML SOPN, Inject into the  skin., Disp: , Rfl:   Allergies  Allergen Reactions  . Atorvastatin Itching and Other (See Comments)    Tired, weakness Tired, weakness TIRED Tired, weakness   . Colesevelam  Other (See Comments)    tired TIRED tired   . Iodinated Diagnostic Agents Other (See Comments)    HIVES HIVES   . Iohexol      Code: HIVES, Desc: alleric to renografin,isovue & omnipaque, hives, requires 13 hr prep//a.calhoun, Onset Date: [id number]   . Lovastatin Other (See Comments)    Tired, nervousness TIRED Tired, nervousness   . Methadone Hcl Other (See Comments)    HALLUCINATIONS HALLUCINATIONS   . Morphine Sulfate   . Other Other (See Comments)    HIVES  . Oxycodone Hcl   . Penicillins   . Promethazine Other (See Comments)    Mental status change DELIRIUM Mental status change   . Quetiapine   . Rosuvastatin Other (See Comments)    Tired, weakness TIRED Tired, weakness   . Zolpidem Other (See Comments)  . Carbidopa-Levodopa Anxiety  . Clindamycin Rash  . Doxazosin Rash  . Iodine Other (See Comments) and Rash    HIVES  . Linezolid Other (See Comments) and Rash    HIVES     Objective: Vitals:   08/17/18 1514  Temp: 97.7 F (36.5 C)   Vascular Examination: Capillary refill time <3 seconds  x 10 digits.  Dorsalis pedis and Posterior tibial pulses palpable b/l.  Digital hair present x 10 digits.  Skin temperature gradient WNL b/l.  Dermatological Examination: Skin with normal turgor, texture and tone b/l.  Toenails 1-5 b/l discolored, thick, dystrophic with subungual debris and pain with palpation to nailbeds due to thickness of nails.  Hyperkeratotic lesion right hallux. No erythema, no edema, no drainage no flocculence.  Porokeratotic lesion submet head 5 left foot with tenderness to palpation. No erythema, no edema, no drainage no flocculence.  Musculoskeletal: Muscle strength 5/5 to all LE muscle groups.  No gross bony deformities b/l.  No pain, crepitus  or joint limitation noted with ROM.   Neurological: Sensation diminished with 10 gram monofilament.  Vibratory sensation diminished.  Assessment: Painful onychomycosis toenails 1-5 b/l  Porokeratosis submet head 5 left Callus right hallux Diabetic neuropathy  Plan: 1. Toenails 1-5 b/l were debrided in length and girth without iatrogenic bleeding. 2. Calluses pared right hallux utilizing sterile scalpel blade without incident. Porokeratotic lesions pared and enucleated submet head 5 left foot utilizing sterile scalpel blade without incident.  Advised patient to refrain from callus pads for a few days. Be cognizant of shoe gear he is using. Patient to continue soft, supportive shoe gear daily. Patient to report any pedal injuries to medical professional immediately. Last Medicare visit 07/13/2018. Follow up 4-5 weeks for Medicare eligible visit. Patient/POA to call should there be a concern in the interim.

## 2018-09-11 DIAGNOSIS — I129 Hypertensive chronic kidney disease with stage 1 through stage 4 chronic kidney disease, or unspecified chronic kidney disease: Secondary | ICD-10-CM | POA: Diagnosis not present

## 2018-09-11 DIAGNOSIS — N183 Chronic kidney disease, stage 3 (moderate): Secondary | ICD-10-CM | POA: Diagnosis not present

## 2018-09-12 DIAGNOSIS — N481 Balanitis: Secondary | ICD-10-CM | POA: Diagnosis not present

## 2018-09-12 DIAGNOSIS — N39498 Other specified urinary incontinence: Secondary | ICD-10-CM | POA: Diagnosis not present

## 2018-09-12 DIAGNOSIS — N472 Paraphimosis: Secondary | ICD-10-CM | POA: Diagnosis not present

## 2018-09-14 DIAGNOSIS — E119 Type 2 diabetes mellitus without complications: Secondary | ICD-10-CM | POA: Diagnosis not present

## 2018-09-14 DIAGNOSIS — E291 Testicular hypofunction: Secondary | ICD-10-CM | POA: Diagnosis not present

## 2018-09-14 DIAGNOSIS — I10 Essential (primary) hypertension: Secondary | ICD-10-CM | POA: Diagnosis not present

## 2018-09-14 DIAGNOSIS — Z794 Long term (current) use of insulin: Secondary | ICD-10-CM | POA: Diagnosis not present

## 2018-09-14 DIAGNOSIS — E785 Hyperlipidemia, unspecified: Secondary | ICD-10-CM | POA: Diagnosis not present

## 2018-09-18 DIAGNOSIS — G20 Parkinson's disease: Secondary | ICD-10-CM | POA: Diagnosis not present

## 2018-09-18 DIAGNOSIS — M545 Low back pain: Secondary | ICD-10-CM | POA: Diagnosis not present

## 2018-09-18 DIAGNOSIS — E1142 Type 2 diabetes mellitus with diabetic polyneuropathy: Secondary | ICD-10-CM | POA: Diagnosis not present

## 2018-09-18 DIAGNOSIS — G4700 Insomnia, unspecified: Secondary | ICD-10-CM | POA: Diagnosis not present

## 2018-09-18 DIAGNOSIS — F419 Anxiety disorder, unspecified: Secondary | ICD-10-CM | POA: Diagnosis not present

## 2018-09-18 DIAGNOSIS — G2581 Restless legs syndrome: Secondary | ICD-10-CM | POA: Diagnosis not present

## 2018-09-18 DIAGNOSIS — R2681 Unsteadiness on feet: Secondary | ICD-10-CM | POA: Diagnosis not present

## 2018-09-21 ENCOUNTER — Other Ambulatory Visit: Payer: Self-pay

## 2018-09-21 ENCOUNTER — Encounter: Payer: Self-pay | Admitting: Podiatry

## 2018-09-21 ENCOUNTER — Ambulatory Visit (INDEPENDENT_AMBULATORY_CARE_PROVIDER_SITE_OTHER): Payer: Medicare Other | Admitting: Podiatry

## 2018-09-21 VITALS — Temp 98.4°F

## 2018-09-21 DIAGNOSIS — M79674 Pain in right toe(s): Secondary | ICD-10-CM

## 2018-09-21 DIAGNOSIS — E1142 Type 2 diabetes mellitus with diabetic polyneuropathy: Secondary | ICD-10-CM | POA: Diagnosis not present

## 2018-09-21 DIAGNOSIS — M79675 Pain in left toe(s): Secondary | ICD-10-CM | POA: Diagnosis not present

## 2018-09-21 DIAGNOSIS — L84 Corns and callosities: Secondary | ICD-10-CM | POA: Diagnosis not present

## 2018-09-21 DIAGNOSIS — B351 Tinea unguium: Secondary | ICD-10-CM | POA: Diagnosis not present

## 2018-09-21 NOTE — Patient Instructions (Signed)
Diabetes Mellitus and Foot Care Foot care is an important part of your health, especially when you have diabetes. Diabetes may cause you to have problems because of poor blood flow (circulation) to your feet and legs, which can cause your skin to:  Become thinner and drier.  Break more easily.  Heal more slowly.  Peel and crack. You may also have nerve damage (neuropathy) in your legs and feet, causing decreased feeling in them. This means that you may not notice minor injuries to your feet that could lead to more serious problems. Noticing and addressing any potential problems early is the best way to prevent future foot problems. How to care for your feet Foot hygiene  Wash your feet daily with warm water and mild soap. Do not use hot water. Then, pat your feet and the areas between your toes until they are completely dry. Do not soak your feet as this can dry your skin.  Trim your toenails straight across. Do not dig under them or around the cuticle. File the edges of your nails with an emery board or nail file.  Apply a moisturizing lotion or petroleum jelly to the skin on your feet and to dry, brittle toenails. Use lotion that does not contain alcohol and is unscented. Do not apply lotion between your toes. Shoes and socks  Wear clean socks or stockings every day. Make sure they are not too tight. Do not wear knee-high stockings since they may decrease blood flow to your legs.  Wear shoes that fit properly and have enough cushioning. Always look in your shoes before you put them on to be sure there are no objects inside.  To break in new shoes, wear them for just a few hours a day. This prevents injuries on your feet. Wounds, scrapes, corns, and calluses  Check your feet daily for blisters, cuts, bruises, sores, and redness. If you cannot see the bottom of your feet, use a mirror or ask someone for help.  Do not cut corns or calluses or try to remove them with medicine.  If you  find a minor scrape, cut, or break in the skin on your feet, keep it and the skin around it clean and dry. You may clean these areas with mild soap and water. Do not clean the area with peroxide, alcohol, or iodine.  If you have a wound, scrape, corn, or callus on your foot, look at it several times a day to make sure it is healing and not infected. Check for: ? Redness, swelling, or pain. ? Fluid or blood. ? Warmth. ? Pus or a bad smell. General instructions  Do not cross your legs. This may decrease blood flow to your feet.  Do not use heating pads or hot water bottles on your feet. They may burn your skin. If you have lost feeling in your feet or legs, you may not know this is happening until it is too late.  Protect your feet from hot and cold by wearing shoes, such as at the beach or on hot pavement.  Schedule a complete foot exam at least once a year (annually) or more often if you have foot problems. If you have foot problems, report any cuts, sores, or bruises to your health care provider immediately. Contact a health care provider if:  You have a medical condition that increases your risk of infection and you have any cuts, sores, or bruises on your feet.  You have an injury that is not   healing.  You have redness on your legs or feet.  You feel burning or tingling in your legs or feet.  You have pain or cramps in your legs and feet.  Your legs or feet are numb.  Your feet always feel cold.  You have pain around a toenail. Get help right away if:  You have a wound, scrape, corn, or callus on your foot and: ? You have pain, swelling, or redness that gets worse. ? You have fluid or blood coming from the wound, scrape, corn, or callus. ? Your wound, scrape, corn, or callus feels warm to the touch. ? You have pus or a bad smell coming from the wound, scrape, corn, or callus. ? You have a fever. ? You have a red line going up your leg. Summary  Check your feet every day  for cuts, sores, red spots, swelling, and blisters.  Moisturize feet and legs daily.  Wear shoes that fit properly and have enough cushioning.  If you have foot problems, report any cuts, sores, or bruises to your health care provider immediately.  Schedule a complete foot exam at least once a year (annually) or more often if you have foot problems. This information is not intended to replace advice given to you by your health care provider. Make sure you discuss any questions you have with your health care provider. Document Released: 03/22/2000 Document Revised: 05/07/2017 Document Reviewed: 04/26/2016 Elsevier Interactive Patient Education  2019 [employer]  Corns and Calluses Corns are small areas of thickened skin that occur on the top, sides, or tip of a toe. They contain a cone-shaped core with a point that can press on a nerve below. This causes pain.  Calluses are areas of thickened skin that can occur anywhere on the body, including the hands, fingers, palms, soles of the feet, and heels. Calluses are usually larger than corns. What are the causes? Corns and calluses are caused by rubbing (friction) or pressure, such as from shoes that are too tight or do not fit properly. What increases the risk? Corns are more likely to develop in people who have misshapen toes (toe deformities), such as hammer toes. Calluses can occur with friction to any area of the skin. They are more likely to develop in people who:  Work with their hands.  Wear shoes that fit poorly, are too tight, or are high-heeled.  Have toe deformities. What are the signs or symptoms? Symptoms of a corn or callus include:  A hard growth on the skin.  Pain or tenderness under the skin.  Redness and swelling.  Increased discomfort while wearing tight-fitting shoes, if your feet are affected. If a corn or callus becomes infected, symptoms may include:  Redness and swelling that gets  worse.  Pain.  Fluid, blood, or pus draining from the corn or callus. How is this diagnosed? Corns and calluses may be diagnosed based on your symptoms, your medical history, and a physical exam. How is this treated? Treatment for corns and calluses may include:  Removing the cause of the friction or pressure. This may involve: ? Changing your shoes. ? Wearing shoe inserts (orthotics) or other protective layers in your shoes, such as a corn pad. ? Wearing gloves.  Applying medicine to the skin (topical medicine) to help soften skin in the hardened, thickened areas.  Removing layers of dead skin with a file to reduce the size of the corn or callus.  Removing the corn or callus with a scalpel   or laser.  Taking antibiotic medicines, if your corn or callus is infected.  Having surgery, if a toe deformity is the cause. Follow these instructions at home:   Take over-the-counter and prescription medicines only as told by your health care provider.  If you were prescribed an antibiotic, take it as told by your health care provider. Do not stop taking it even if your condition starts to improve.  Wear shoes that fit well. Avoid wearing high-heeled shoes and shoes that are too tight or too loose.  Wear any padding, protective layers, gloves, or orthotics as told by your health care provider.  Soak your hands or feet and then use a file or pumice stone to soften your corn or callus. Do this as told by your health care provider.  Check your corn or callus every day for symptoms of infection. Contact a health care provider if you:  Notice that your symptoms do not improve with treatment.  Have redness or swelling that gets worse.  Notice that your corn or callus becomes painful.  Have fluid, blood, or pus coming from your corn or callus.  Have new symptoms. Summary  Corns are small areas of thickened skin that occur on the top, sides, or tip of a toe.  Calluses are areas of  thickened skin that can occur anywhere on the body, including the hands, fingers, palms, and soles of the feet. Calluses are usually larger than corns.  Corns and calluses are caused by rubbing (friction) or pressure, such as from shoes that are too tight or do not fit properly.  Treatment may include wearing any padding, protective layers, gloves, or orthotics as told by your health care provider. This information is not intended to replace advice given to you by your health care provider. Make sure you discuss any questions you have with your health care provider. Document Released: 12/30/2003 Document Revised: 02/05/2017 Document Reviewed: 02/05/2017 Elsevier Interactive Patient Education  2019 [employer]  

## 2018-09-29 DIAGNOSIS — E291 Testicular hypofunction: Secondary | ICD-10-CM | POA: Diagnosis not present

## 2018-09-29 DIAGNOSIS — C675 Malignant neoplasm of bladder neck: Secondary | ICD-10-CM | POA: Diagnosis not present

## 2018-10-01 NOTE — Progress Notes (Signed)
Subjective: Jason Hartman presents with h/o diabetes, diabetic neuropathy and cc of painful, discolored, thick toenails and painful calluses which interfere with activities of daily living. Pain is aggravated when wearing enclosed shoe gear. Pain is relieved with periodic professional debridement.  Jason Manes, MD is his PCP. Last visit 08/17/2018 per patient recall.   Current Outpatient Medications:  .  acetaminophen (TYLENOL) 500 MG tablet, Take by mouth., Disp: , Rfl:  .  acyclovir (ZOVIRAX) 800 MG tablet, Take by mouth., Disp: , Rfl:  .  allopurinol (ZYLOPRIM) 100 MG tablet, Take 2 tablets daily., Disp: 60 tablet, Rfl: 0 .  allopurinol (ZYLOPRIM) 100 MG tablet, TAKE 2 TABLETS BY MOUTH EVERY DAY, Disp: 60 tablet, Rfl: 0 .  allopurinol (ZYLOPRIM) 100 MG tablet, Take 1 tablet (100 mg total) by mouth daily., Disp: 60 tablet, Rfl: 0 .  allopurinol (ZYLOPRIM) 300 MG tablet, TK 1 T PO D UTD, Disp: , Rfl:  .  ALPRAZolam (XANAX) 0.5 MG tablet, Take 1 tab twice a day as needed for anxiety, Disp: , Rfl:  .  amantadine (SYMMETREL) 100 MG capsule, TK 1 C PO QHS FOR 3 DAYS THEN TK 1 C PO BID, Disp: , Rfl:  .  amLODipine (NORVASC) 5 MG tablet, , Disp: , Rfl:  .  aspirin EC 325 MG tablet, Take 325 mg by mouth daily. 1-2 daily prn, Disp: , Rfl:  .  BYSTOLIC 5 MG tablet, , Disp: , Rfl:  .  cetirizine (ZYRTEC) 10 MG tablet, Take by mouth., Disp: , Rfl:  .  chlorthalidone (HYGROTON) 25 MG tablet, Take by mouth., Disp: , Rfl:  .  ciprofloxacin (CIPRO) 500 MG tablet, TK 1 T PO BID, Disp: , Rfl: 0 .  clonazePAM (KLONOPIN) 0.5 MG tablet, Take 1 tab twice a day consistently, and 1 extra a day if needed (to replace xanax), Disp: , Rfl:  .  clotrimazole-betamethasone (LOTRISONE) cream, Apply to affected area twice daily, Disp: , Rfl:  .  co-enzyme Q-10 50 MG capsule, Take 50 mg by mouth daily., Disp: , Rfl:  .  colchicine 0.6 MG tablet, Take 1 tablet (0.6 mg total) by mouth daily., Disp: 10 tablet, Rfl: 0 .   colchicine 0.6 MG tablet, Take 1 tablet (0.6 mg total) by mouth daily., Disp: 10 tablet, Rfl: 0 .  desonide (DESOWEN) 0.05 % cream, Apply topically., Disp: , Rfl:  .  diclofenac sodium (VOLTAREN) 1 % GEL, Apply topically., Disp: , Rfl:  .  DIOVAN 320 MG tablet, , Disp: , Rfl:  .  DOCOSAHEXAENOIC ACID PO, Take 1 g by mouth., Disp: , Rfl:  .  famotidine (PEPCID) 40 MG tablet, Take by mouth., Disp: , Rfl:  .  ferrous sulfate 325 (65 FE) MG EC tablet, , Disp: , Rfl:  .  fluconazole (DIFLUCAN) 200 MG tablet, Take by mouth., Disp: , Rfl:  .  Flunisolide HFA (AEROSPAN) 80 MCG/ACT AERS, Inhale into the lungs., Disp: , Rfl:  .  fluticasone (FLONASE) 50 MCG/ACT nasal spray, Place into the nose., Disp: , Rfl:  .  fluvoxaMINE (LUVOX) 25 MG tablet, , Disp: , Rfl: 5 .  gabapentin (NEURONTIN) 600 MG tablet, TAKE 2 TABLETS BY MOUTH THREE TIMES DAILY, Disp: , Rfl:  .  Gabapentin Enacarbil (HORIZANT) 600 MG TB24, Take 600 mg by mouth 2 (two) times daily., Disp: 180 tablet, Rfl: 3 .  hydrochlorothiazide (HYDRODIURIL) 12.5 MG tablet, TK 1 T PO QD IN THE MORNING, Disp: , Rfl: 11 .  HYDROcodone-acetaminophen (NORCO) 10-325  MG per tablet, Take 1 tablet by mouth every 4 (four) hours as needed (for pain)., Disp: 30 tablet, Rfl: 0 .  HYDROcodone-acetaminophen (NORCO/VICODIN) 5-325 MG per tablet, Take 1-2 tablets by mouth daily. , Disp: , Rfl:  .  ketorolac (TORADOL) 10 MG tablet, Take 1 tablet (10 mg total) by mouth every 6 (six) hours as needed for moderate pain., Disp: 20 tablet, Rfl: 0 .  L-METHYLFOLATE CALCIUM PO, Take by mouth., Disp: , Rfl:  .  L-Methylfolate-Algae-B12-B6 (METANX) 3-90.314-2-35 MG CAPS, Take by mouth., Disp: , Rfl:  .  LANTUS SOLOSTAR 100 UNIT/ML SOPN, , Disp: , Rfl:  .  liraglutide (VICTOZA) 18 MG/3ML SOPN, Inject into the skin., Disp: , Rfl:  .  magnesium citrate SOLN, Take by mouth., Disp: , Rfl:  .  magnesium citrate SOLN, Take by mouth., Disp: , Rfl:  .  meloxicam (MOBIC) 15 MG tablet, Take  1 tablet (15 mg total) by mouth daily., Disp: 10 tablet, Rfl: 0 .  metFORMIN (GLUCOPHAGE-XR) 500 MG 24 hr tablet, , Disp: , Rfl:  .  multivitamin (METANX) 3-35-2 MG TABS tablet, Take 1 tablet by mouth 2 (two) times daily., Disp: 180 tablet, Rfl: 3 .  nitroGLYCERIN (NITROSTAT) 0.4 MG SL tablet, Place under the tongue., Disp: , Rfl:  .  NONFORMULARY OR COMPOUNDED ITEM, [facility]:  Antiinflammatory cream - Diclofenac 3%, Baclofen 2%, Lidocaine 2%, apply 1-2 grams to affected area 3-4 times daily., Disp: 120 each, Rfl: 2 .  NOVOFINE 32G X 6 MM MISC, , Disp: , Rfl:  .  olmesartan (BENICAR) 40 MG tablet, TAKE 1 TABLET(40 MG) BY MOUTH DAILY, Disp: , Rfl:  .  Omega-3 1000 MG CAPS, Take by mouth., Disp: , Rfl:  .  omega-3 acid ethyl esters (LOVAZA) 1 G capsule, Take 2 g by mouth 2 (two) times daily. 4 Tabs qd, Disp: , Rfl:  .  ONE TOUCH ULTRA TEST test strip, , Disp: , Rfl:  .  ONETOUCH DELICA LANCETS 82N MISC, , Disp: , Rfl:  .  oxybutynin (DITROPAN) 5 MG tablet, TK 1 T PO BID, Disp: , Rfl:  .  OXYCONTIN 10 MG 12 hr tablet, TK 1 T PO Q 12 H FOR 5 DAYS, Disp: , Rfl:  .  Polyethylene Glycol 3350 (PEG 3350) POWD, Take by mouth., Disp: , Rfl:  .  predniSONE (DELTASONE) 5 MG tablet, , Disp: , Rfl: 0 .  rotigotine (NEUPRO) 6 MG/24HR, Place 1 patch onto the skin daily., Disp: 90 patch, Rfl: 3 .  STENDRA 200 MG TABS, as needed., Disp: , Rfl:  .  tamsulosin (FLOMAX) 0.4 MG CAPS capsule, Take one capsule daily until stone passes., Disp: 30 capsule, Rfl: 0 .  terazosin (HYTRIN) 2 MG capsule, Take by mouth., Disp: , Rfl:  .  testosterone cypionate (DEPOTESTOTERONE CYPIONATE) 100 MG/ML injection, Inject into the muscle., Disp: , Rfl:  .  triamcinolone lotion (KENALOG) 0.1 %, , Disp: , Rfl:  .  Ubiquinol 100 MG CAPS, Take by mouth., Disp: , Rfl:  .  Urea 39 % CREA, Apply topically as needed, Disp: , Rfl:  .  Vilazodone HCl (VIIBRYD) 10 MG TABS, Take 1/2 a day for a week, then 1 a day, Disp: , Rfl:    Allergies  Allergen Reactions  . Atorvastatin Itching and Other (See Comments)    Tired, weakness Tired, weakness TIRED Tired, weakness   . Colesevelam Other (See Comments)    tired TIRED tired   . Iodinated Diagnostic Agents Other (See  Comments)    HIVES HIVES   . Iohexol      Code: HIVES, Desc: alleric to renografin,isovue & omnipaque, hives, requires 13 hr prep//a.calhoun, Onset Date: [id number]   . Lovastatin Other (See Comments)    Tired, nervousness TIRED Tired, nervousness   . Methadone Hcl Other (See Comments)    HALLUCINATIONS HALLUCINATIONS   . Morphine Sulfate   . Other Other (See Comments)    HIVES  . Oxycodone Hcl   . Penicillins   . Promethazine Other (See Comments)    Mental status change DELIRIUM Mental status change   . Quetiapine   . Rosuvastatin Other (See Comments)    Tired, weakness TIRED Tired, weakness   . Zolpidem Other (See Comments)  . Carbidopa-Levodopa Anxiety  . Clindamycin Rash  . Doxazosin Rash  . Iodine Other (See Comments) and Rash    HIVES  . Linezolid Other (See Comments) and Rash    HIVES     Objective: Vitals:   09/21/18 1037  Temp: 98.4 F (36.9 C)    Vascular Examination: Capillary refill time < seconds x 10 digits.  Dorsalis pedis pulses palpable b/l.  Posterior tibial pulses palpable b/l.  Digital hair present x 10 digits.  Skin temperature gradient WNL b/l.  Dermatological Examination: Skin with normal turgor, texture and tone b/l.  Toenails 1-5 b/l discolored, thick, dystrophic with subungual debris and pain with palpation to nailbeds due to thickness of nails. Incurvated nailplate left great toe lateral border with tenderness to palpation. No erythema, no edema, no drainage noted.  Hyperkeratotic lesion(s) submet head 5 left foot and right hallux. No erythema, no edema, no drainage, no flocculence noted.   Musculoskeletal: Muscle strength 5/5 to all LE muscle groups.  No pain, crepitus or  joint limitation with passive/active ROM.  Neurological: Sensation diminished b/l with 10 gram monofilament.  Vibratory sensation diminished  Assessment: 1. Painful onychomycosis toenails 1-5 b/l 2. Callus submet head 5 left foot 3. Callus right hallux 4. NIDDM with Diabetic neuropathy  Plan: 1. Continue diabetic foot care principles. Literature dispensed on today. 2. Toenails 1-5 b/l were debrided in length and girth without iatrogenic bleeding.Offending nail border debrided and curretaged left great toe. Border cleansed with alcohol. Antibiotic ointment applied. Mr. Heeter instructed to apply triple antibiotic ointment to bilateral great toes once daily for one week. 3. Calluses pared utilizing sterile scalpel blade submetatarsal head(s) 5 left foot without incident.and sustained small laceration during paring of right hallux callus. Lumicain hemostatic solution and antibiotic ointment applied.  4. Patient to continue soft, supportive shoe gear 5. Patient to report any pedal injuries to medical professional  6. Follow up 4 weeks for non-Medicare eligible visit. 7. Patient/POA to call should there be a concern in the interim.

## 2018-10-02 DIAGNOSIS — M19012 Primary osteoarthritis, left shoulder: Secondary | ICD-10-CM | POA: Diagnosis not present

## 2018-10-06 DIAGNOSIS — R896 Abnormal cytological findings in specimens from other organs, systems and tissues: Secondary | ICD-10-CM | POA: Diagnosis not present

## 2018-10-06 DIAGNOSIS — C675 Malignant neoplasm of bladder neck: Secondary | ICD-10-CM | POA: Diagnosis not present

## 2018-10-14 DIAGNOSIS — E291 Testicular hypofunction: Secondary | ICD-10-CM | POA: Diagnosis not present

## 2018-10-19 DIAGNOSIS — E1121 Type 2 diabetes mellitus with diabetic nephropathy: Secondary | ICD-10-CM | POA: Diagnosis not present

## 2018-10-19 DIAGNOSIS — R000 Tachycardia, unspecified: Secondary | ICD-10-CM | POA: Diagnosis not present

## 2018-10-19 DIAGNOSIS — I129 Hypertensive chronic kidney disease with stage 1 through stage 4 chronic kidney disease, or unspecified chronic kidney disease: Secondary | ICD-10-CM | POA: Diagnosis not present

## 2018-10-19 DIAGNOSIS — N390 Urinary tract infection, site not specified: Secondary | ICD-10-CM | POA: Diagnosis not present

## 2018-10-19 DIAGNOSIS — Z79899 Other long term (current) drug therapy: Secondary | ICD-10-CM | POA: Diagnosis not present

## 2018-10-19 DIAGNOSIS — L749 Eccrine sweat disorder, unspecified: Secondary | ICD-10-CM | POA: Diagnosis not present

## 2018-10-19 DIAGNOSIS — K5901 Slow transit constipation: Secondary | ICD-10-CM | POA: Diagnosis not present

## 2018-10-19 DIAGNOSIS — N183 Chronic kidney disease, stage 3 (moderate): Secondary | ICD-10-CM | POA: Diagnosis not present

## 2018-10-20 DIAGNOSIS — N390 Urinary tract infection, site not specified: Secondary | ICD-10-CM | POA: Diagnosis not present

## 2018-10-23 ENCOUNTER — Ambulatory Visit (INDEPENDENT_AMBULATORY_CARE_PROVIDER_SITE_OTHER): Payer: Medicare Other | Admitting: Podiatry

## 2018-10-23 ENCOUNTER — Other Ambulatory Visit: Payer: Self-pay

## 2018-10-23 ENCOUNTER — Encounter: Payer: Self-pay | Admitting: Podiatry

## 2018-10-23 VITALS — Temp 97.2°F

## 2018-10-23 DIAGNOSIS — M79674 Pain in right toe(s): Secondary | ICD-10-CM

## 2018-10-23 DIAGNOSIS — B351 Tinea unguium: Secondary | ICD-10-CM

## 2018-10-23 DIAGNOSIS — M79675 Pain in left toe(s): Secondary | ICD-10-CM

## 2018-10-23 NOTE — Patient Instructions (Signed)

## 2018-10-26 DIAGNOSIS — E1121 Type 2 diabetes mellitus with diabetic nephropathy: Secondary | ICD-10-CM | POA: Diagnosis not present

## 2018-10-26 DIAGNOSIS — R259 Unspecified abnormal involuntary movements: Secondary | ICD-10-CM | POA: Diagnosis not present

## 2018-10-26 DIAGNOSIS — N183 Chronic kidney disease, stage 3 (moderate): Secondary | ICD-10-CM | POA: Diagnosis not present

## 2018-10-26 DIAGNOSIS — L749 Eccrine sweat disorder, unspecified: Secondary | ICD-10-CM | POA: Diagnosis not present

## 2018-10-26 DIAGNOSIS — I129 Hypertensive chronic kidney disease with stage 1 through stage 4 chronic kidney disease, or unspecified chronic kidney disease: Secondary | ICD-10-CM | POA: Diagnosis not present

## 2018-10-26 NOTE — Progress Notes (Signed)
Subjective: Presents for non-Medicare covered visit Jason Hartman presents with diabetes, diabetic neuropathy and cc of painful, discolored, thick toenails which interfere with activities of daily living. Pain is aggravated when wearing enclosed shoe gear. Pain is relieved with periodic professional.   He relates his callus feels much better using the Foot Miracle Cream.  [name], MD is his PCP.   His wife is present during the visit.   Current Outpatient Medications:  .  methylPREDNISolone acetate (DEPO-MEDROL) 80 MG/ML injection, by Intra-Lesional route., Disp: , Rfl:  .  acetaminophen (TYLENOL) 500 MG tablet, Take by mouth., Disp: , Rfl:  .  acyclovir (ZOVIRAX) 800 MG tablet, Take by mouth., Disp: , Rfl:  .  allopurinol (ZYLOPRIM) 100 MG tablet, Take 2 tablets daily., Disp: 60 tablet, Rfl: 0 .  allopurinol (ZYLOPRIM) 100 MG tablet, TAKE 2 TABLETS BY MOUTH EVERY DAY, Disp: 60 tablet, Rfl: 0 .  allopurinol (ZYLOPRIM) 100 MG tablet, Take 1 tablet (100 mg total) by mouth daily., Disp: 60 tablet, Rfl: 0 .  allopurinol (ZYLOPRIM) 300 MG tablet, TK 1 T PO D UTD, Disp: , Rfl:  .  ALPRAZolam (XANAX) 0.5 MG tablet, Take 1 tab twice a day as needed for anxiety, Disp: , Rfl:  .  amantadine (SYMMETREL) 100 MG capsule, TK 1 C PO QHS FOR 3 DAYS THEN TK 1 C PO BID, Disp: , Rfl:  .  amLODipine (NORVASC) 5 MG tablet, , Disp: , Rfl:  .  aspirin EC 325 MG tablet, Take 325 mg by mouth daily. 1-2 daily prn, Disp: , Rfl:  .  BYSTOLIC 5 MG tablet, , Disp: , Rfl:  .  cetirizine (ZYRTEC) 10 MG tablet, Take by mouth., Disp: , Rfl:  .  chlorthalidone (HYGROTON) 25 MG tablet, Take by mouth., Disp: , Rfl:  .  ciprofloxacin (CIPRO) 500 MG tablet, TK 1 T PO BID, Disp: , Rfl: 0 .  clonazePAM (KLONOPIN) 0.5 MG tablet, Take 1 tab twice a day consistently, and 1 extra a day if needed (to replace xanax), Disp: , Rfl:  .  clotrimazole-betamethasone (LOTRISONE) cream, Apply to affected area twice daily, Disp: , Rfl:   .  co-enzyme Q-10 50 MG capsule, Take 50 mg by mouth daily., Disp: , Rfl:  .  colchicine 0.6 MG tablet, Take 1 tablet (0.6 mg total) by mouth daily., Disp: 10 tablet, Rfl: 0 .  colchicine 0.6 MG tablet, Take 1 tablet (0.6 mg total) by mouth daily., Disp: 10 tablet, Rfl: 0 .  desonide (DESOWEN) 0.05 % cream, Apply topically., Disp: , Rfl:  .  diclofenac sodium (VOLTAREN) 1 % GEL, Apply topically., Disp: , Rfl:  .  diltiazem (CARDIZEM CD) 120 MG 24 hr capsule, TK 1 C PO QD, Disp: , Rfl:  .  DIOVAN 320 MG tablet, , Disp: , Rfl:  .  DOCOSAHEXAENOIC ACID PO, Take 1 g by mouth., Disp: , Rfl:  .  famotidine (PEPCID) 40 MG tablet, Take by mouth., Disp: , Rfl:  .  ferrous sulfate 325 (65 FE) MG EC tablet, , Disp: , Rfl:  .  fluconazole (DIFLUCAN) 200 MG tablet, Take by mouth., Disp: , Rfl:  .  Flunisolide HFA (AEROSPAN) 80 MCG/ACT AERS, Inhale into the lungs., Disp: , Rfl:  .  fluticasone (FLONASE) 50 MCG/ACT nasal spray, Place into the nose., Disp: , Rfl:  .  fluvoxaMINE (LUVOX) 25 MG tablet, , Disp: , Rfl: 5 .  gabapentin (NEURONTIN) 600 MG tablet, TAKE 2 TABLETS BY MOUTH THREE TIMES  DAILY, Disp: , Rfl:  .  Gabapentin Enacarbil (HORIZANT) 600 MG TB24, Take 600 mg by mouth 2 (two) times daily., Disp: 180 tablet, Rfl: 3 .  hydrochlorothiazide (HYDRODIURIL) 12.5 MG tablet, TK 1 T PO QD IN THE MORNING, Disp: , Rfl: 11 .  HYDROcodone-acetaminophen (NORCO) 10-325 MG per tablet, Take 1 tablet by mouth every 4 (four) hours as needed (for pain)., Disp: 30 tablet, Rfl: 0 .  HYDROcodone-acetaminophen (NORCO/VICODIN) 5-325 MG per tablet, Take 1-2 tablets by mouth daily. , Disp: , Rfl:  .  ketorolac (TORADOL) 10 MG tablet, Take 1 tablet (10 mg total) by mouth every 6 (six) hours as needed for moderate pain., Disp: 20 tablet, Rfl: 0 .  L-METHYLFOLATE CALCIUM PO, Take by mouth., Disp: , Rfl:  .  L-Methylfolate-Algae-B12-B6 (METANX) 3-90.314-2-35 MG CAPS, Take by mouth., Disp: , Rfl:  .  LANTUS SOLOSTAR 100 UNIT/ML  SOPN, , Disp: , Rfl:  .  liraglutide (VICTOZA) 18 MG/3ML SOPN, Inject into the skin., Disp: , Rfl:  .  magnesium citrate SOLN, Take by mouth., Disp: , Rfl:  .  magnesium citrate SOLN, Take by mouth., Disp: , Rfl:  .  meloxicam (MOBIC) 15 MG tablet, Take 1 tablet (15 mg total) by mouth daily., Disp: 10 tablet, Rfl: 0 .  metFORMIN (GLUCOPHAGE-XR) 500 MG 24 hr tablet, , Disp: , Rfl:  .  metoprolol succinate (TOPROL-XL) 25 MG 24 hr tablet, TK 1 T PO QD, Disp: , Rfl:  .  multivitamin (METANX) 3-35-2 MG TABS tablet, Take 1 tablet by mouth 2 (two) times daily., Disp: 180 tablet, Rfl: 3 .  nitroGLYCERIN (NITROSTAT) 0.4 MG SL tablet, Place under the tongue., Disp: , Rfl:  .  NONFORMULARY OR COMPOUNDED ITEM, [facility]:  Antiinflammatory cream - Diclofenac 3%, Baclofen 2%, Lidocaine 2%, apply 1-2 grams to affected area 3-4 times daily., Disp: 120 each, Rfl: 2 .  NOVOFINE 32G X 6 MM MISC, , Disp: , Rfl:  .  olmesartan (BENICAR) 40 MG tablet, TAKE 1 TABLET(40 MG) BY MOUTH DAILY, Disp: , Rfl:  .  Omega-3 1000 MG CAPS, Take by mouth., Disp: , Rfl:  .  omega-3 acid ethyl esters (LOVAZA) 1 G capsule, Take 2 g by mouth 2 (two) times daily. 4 Tabs qd, Disp: , Rfl:  .  ONE TOUCH ULTRA TEST test strip, , Disp: , Rfl:  .  ONETOUCH DELICA LANCETS 93J MISC, , Disp: , Rfl:  .  oxybutynin (DITROPAN) 5 MG tablet, TK 1 T PO BID, Disp: , Rfl:  .  OXYCONTIN 10 MG 12 hr tablet, TK 1 T PO Q 12 H FOR 5 DAYS, Disp: , Rfl:  .  Polyethylene Glycol 3350 (PEG 3350) POWD, Take by mouth., Disp: , Rfl:  .  predniSONE (DELTASONE) 5 MG tablet, , Disp: , Rfl: 0 .  rotigotine (NEUPRO) 6 MG/24HR, Place 1 patch onto the skin daily., Disp: 90 patch, Rfl: 3 .  STENDRA 200 MG TABS, as needed., Disp: , Rfl:  .  tamsulosin (FLOMAX) 0.4 MG CAPS capsule, Take one capsule daily until stone passes., Disp: 30 capsule, Rfl: 0 .  terazosin (HYTRIN) 2 MG capsule, Take by mouth., Disp: , Rfl:  .  testosterone cypionate (DEPOTESTOTERONE  CYPIONATE) 100 MG/ML injection, Inject into the muscle., Disp: , Rfl:  .  triamcinolone lotion (KENALOG) 0.1 %, , Disp: , Rfl:  .  Ubiquinol 100 MG CAPS, Take by mouth., Disp: , Rfl:  .  Urea 39 % CREA, Apply topically as needed, Disp: ,  Rfl:  .  Vilazodone HCl (VIIBRYD) 10 MG TABS, Take 1/2 a day for a week, then 1 a day, Disp: , Rfl:   Allergies  Allergen Reactions  . Atorvastatin Itching and Other (See Comments)    Tired, weakness Tired, weakness TIRED Tired, weakness   . Colesevelam Other (See Comments)    tired TIRED tired   . Iodinated Diagnostic Agents Other (See Comments)    HIVES HIVES   . Iohexol      Code: HIVES, Desc: alleric to renografin,isovue & omnipaque, hives, requires 13 hr prep//a.calhoun, Onset Date: [id number]   . Lovastatin Other (See Comments)    Tired, nervousness TIRED Tired, nervousness   . Methadone Hcl Other (See Comments)    HALLUCINATIONS HALLUCINATIONS   . Morphine Sulfate   . Other Other (See Comments)    HIVES  . Oxycodone Hcl   . Penicillins   . Promethazine Other (See Comments)    Mental status change DELIRIUM Mental status change   . Quetiapine   . Rosuvastatin Other (See Comments)    Tired, weakness TIRED Tired, weakness   . Zolpidem Other (See Comments)  . Carbidopa-Levodopa Anxiety  . Clindamycin Rash  . Doxazosin Rash  . Iodine Other (See Comments) and Rash    HIVES  . Linezolid Other (See Comments) and Rash    HIVES     Objective: Vitals:   10/23/18 0907  Temp: (!) 97.2 F (36.2 C)    Vascular Examination: Capillary refill time <3 seconds  x 10 digits.  Dorsalis pedis pulses palpable b/l.  Posterior tibial pulses palpable b/l.  Digital hair present x 10 digits.  Skin temperature gradient WNL b/l.  Dermatological Examination: Skin with normal turgor, texture and tone b/l.  Toenails 1-5 b/l discolored, thick, dystrophic with subungual debris and pain with palpation to nailbeds due to thickness of  nails.  Musculoskeletal: Muscle strength 5/5 to all LE muscle groups.  No pain, crepitus or joint limitation with passive/active ROM.  Neurological: Sensation diminished with 10 gram monofilament.  Vibratory sensation diminished  Assessment: 1. Painful onychomycosis toenails 1-5 b/l 2. NIDDM with Diabetic neuropathy  Plan: 1. Continue diabetic foot care principles. Literature dispensed on today. 2. Toenails 1-5 b/l were debrided in length and girth without iatrogenic bleeding. 3. Patient to continue soft, supportive shoe gear 4. Patient to report any pedal injuries to medical professional  5. Follow up 4 weeks for Medicare covered visit.  6. Patient/POA to call should there be a concern in the interim.

## 2018-10-28 DIAGNOSIS — E291 Testicular hypofunction: Secondary | ICD-10-CM | POA: Diagnosis not present

## 2018-10-30 ENCOUNTER — Other Ambulatory Visit: Payer: Self-pay | Admitting: Geriatric Medicine

## 2018-10-30 ENCOUNTER — Telehealth: Payer: Self-pay | Admitting: Nurse Practitioner

## 2018-10-30 DIAGNOSIS — R61 Generalized hyperhidrosis: Secondary | ICD-10-CM

## 2018-10-30 NOTE — Telephone Encounter (Signed)
Phone call to patient to review instructions for 13 hr prep for CT w/ contrast on 8/4 at 1340. Prescription called into  Pharmacy. Pt aware and verbalized understanding of instructions. Prescription: 0040- 50mg  Prednisone 0640- 50mg  Prednisone 1240- 50mg  Prednisone and 50mg  Benadryl

## 2018-11-04 DIAGNOSIS — R1011 Right upper quadrant pain: Secondary | ICD-10-CM | POA: Diagnosis not present

## 2018-11-04 DIAGNOSIS — K5900 Constipation, unspecified: Secondary | ICD-10-CM | POA: Diagnosis not present

## 2018-11-04 DIAGNOSIS — R194 Change in bowel habit: Secondary | ICD-10-CM | POA: Diagnosis not present

## 2018-11-04 DIAGNOSIS — R197 Diarrhea, unspecified: Secondary | ICD-10-CM | POA: Diagnosis not present

## 2018-11-10 ENCOUNTER — Ambulatory Visit
Admission: RE | Admit: 2018-11-10 | Discharge: 2018-11-10 | Disposition: A | Discharge status: Home / Self Care | Payer: Medicare Other | Source: Ambulatory Visit | Attending: Geriatric Medicine | Admitting: Geriatric Medicine

## 2018-11-10 DIAGNOSIS — R61 Generalized hyperhidrosis: Secondary | ICD-10-CM

## 2018-11-10 DIAGNOSIS — K5730 Diverticulosis of large intestine without perforation or abscess without bleeding: Secondary | ICD-10-CM | POA: Diagnosis not present

## 2018-11-10 DIAGNOSIS — R918 Other nonspecific abnormal finding of lung field: Secondary | ICD-10-CM | POA: Diagnosis not present

## 2018-11-10 DIAGNOSIS — C61 Malignant neoplasm of prostate: Secondary | ICD-10-CM | POA: Diagnosis not present

## 2018-11-10 IMAGING — CT CT ABDOMEN AND PELVIS WITH CONTRAST
2 of 5 series · 12 of 46 positions shown, 14 images · IV contrast (iopamidol)
Comparison: CT abdomen pelvis [DATE], CT chest [DATE]

CLINICAL DATA: Persistent diaphoresis at night, evaluate for
adenopathy. No history of malignancy

EXAM:
CT CHEST, ABDOMEN, AND PELVIS WITH CONTRAST
TECHNIQUE: Multidetector CT imaging of the chest, abdomen and pelvis was
performed following the standard protocol during bolus
administration of intravenous contrast.
CONTRAST:  80 mL [PF] IOPAMIDOL ([PF]) INJECTION 61%

[Series 2: cap with 5.00 br40 s3 axial · axial · 0.74mm/px · z∈[+1281,+1876]mm · 9 of 139 slices shown, 11 images]
[im 10/139  soft-tissue]
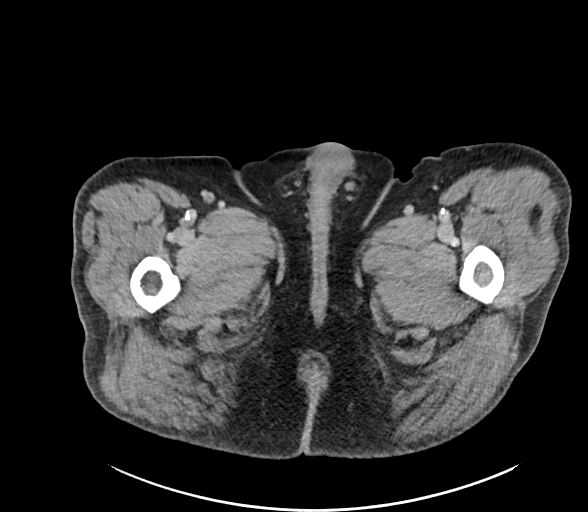
[im 10/139  bone]
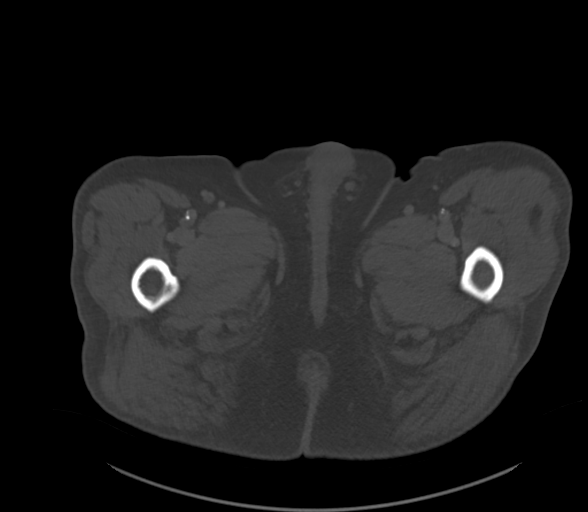
[im 30/139  soft-tissue]
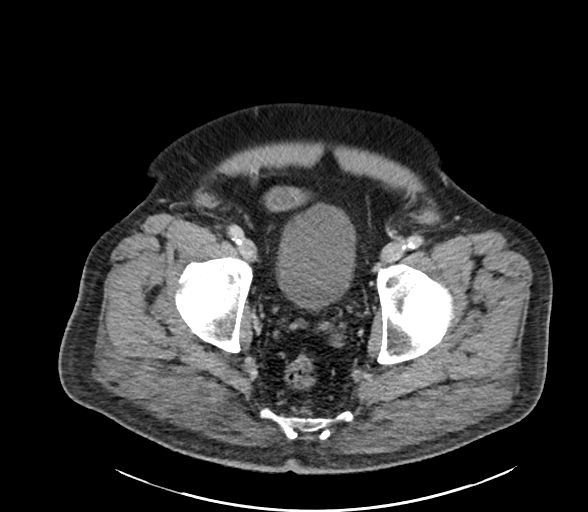
[im 40/139  soft-tissue]
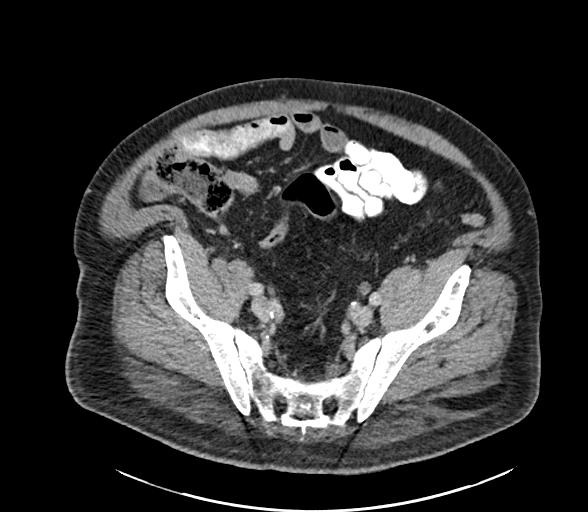
[im 60/139  soft-tissue]
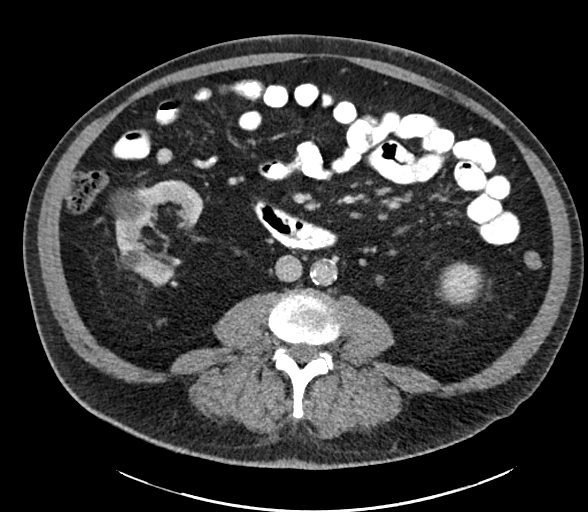
[im 70/139  soft-tissue]
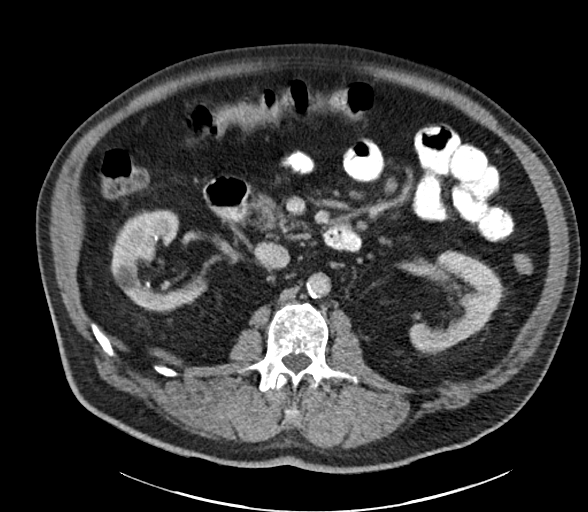
[im 79/139  soft-tissue]
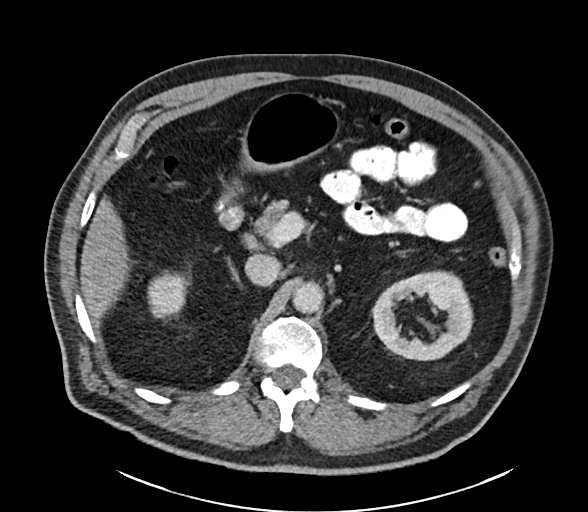
[im 99/139  soft-tissue]
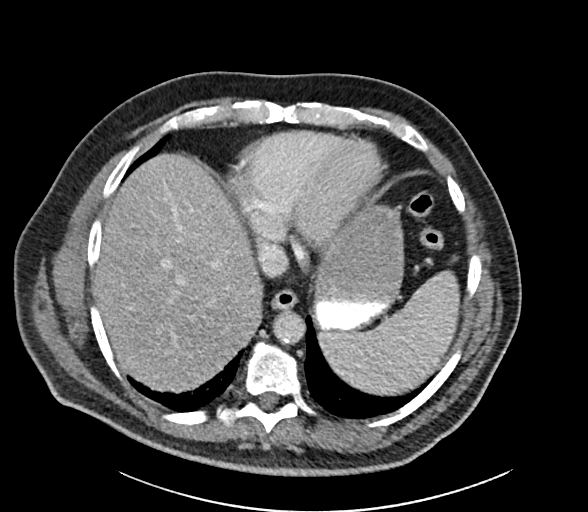
[im 109/139  soft-tissue]
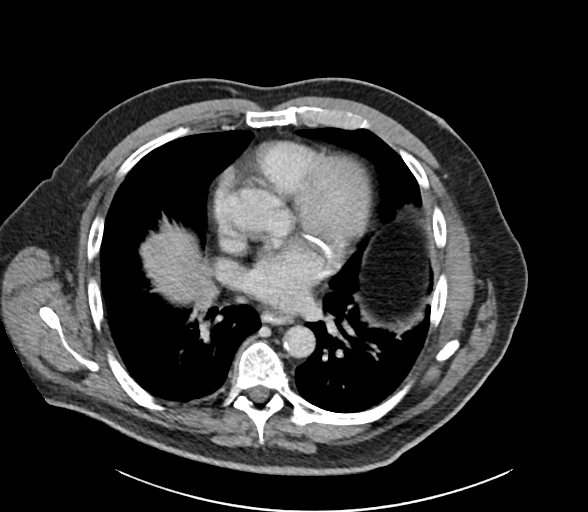
[im 129/139  soft-tissue]
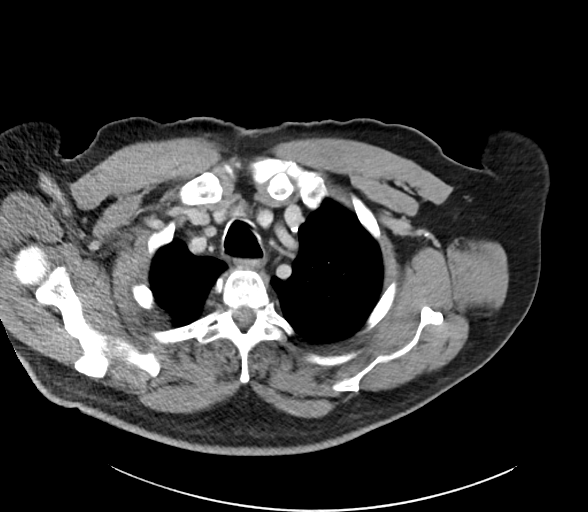
[im 129/139  bone]
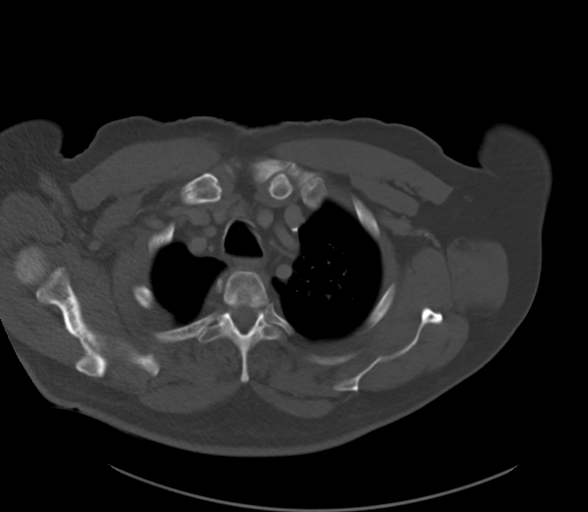

[Series 6: cap with 2.00 br40 s3 cor · coronal · 0.86mm/px · 3 of 190 slices shown]
[im 64/190  soft-tissue]
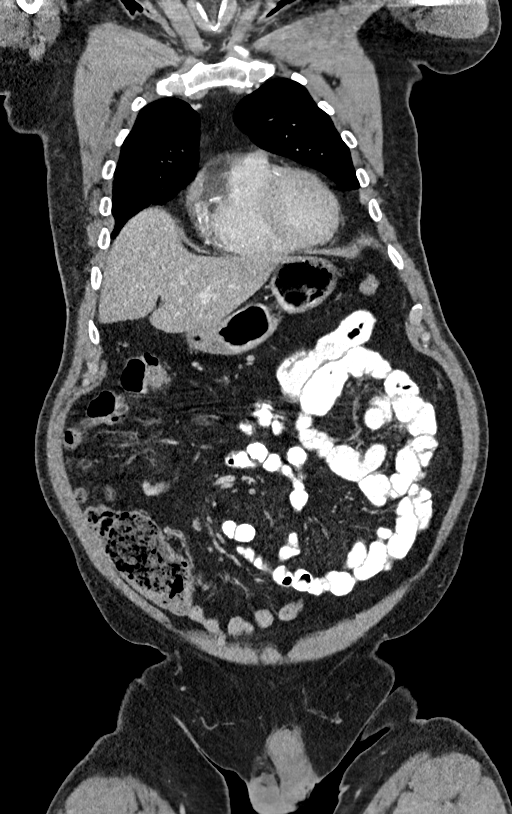
[im 85/190  soft-tissue]
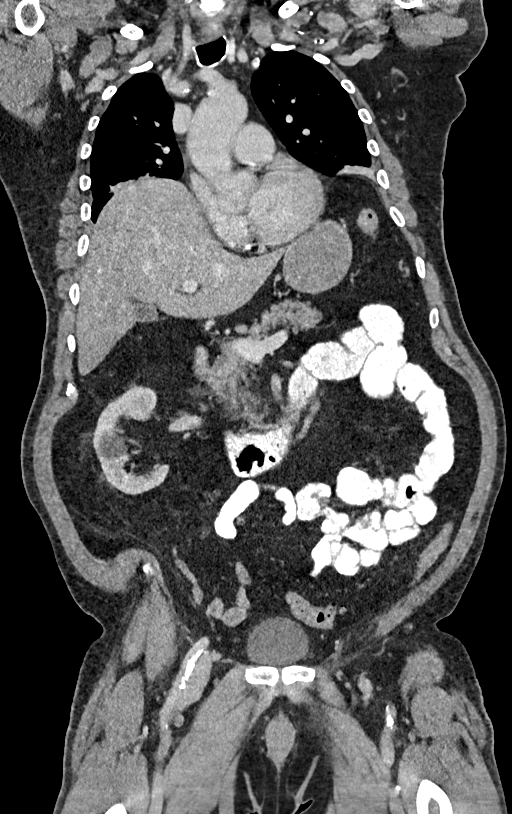
[im 106/190  soft-tissue]
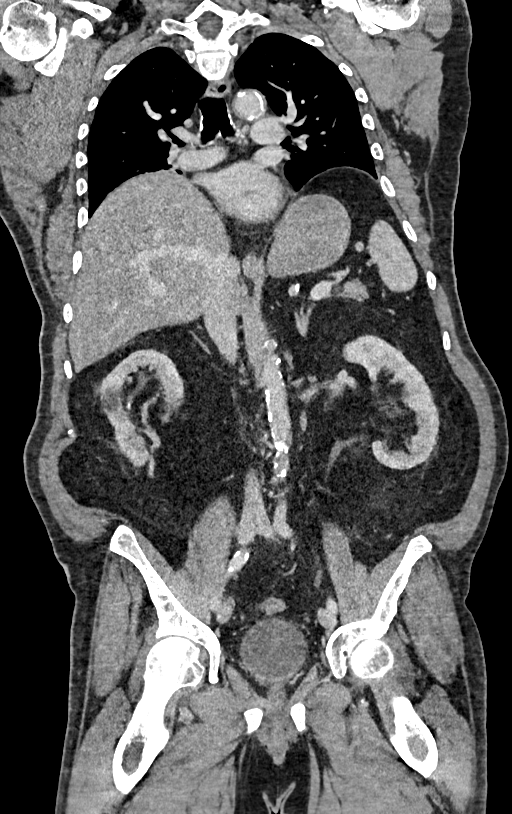

[12 of 46 positions shown; findings below may reference images not displayed]

FINDINGS: CT CHEST FINDINGS

Cardiovascular: Stable dilation of the ascending aorta measuring up
to 4.2 cm. Cardiac pulsation artifact limits evaluation of the
aortic root. There is preservation of the BAZA junction.
Extensive atherosclerotic calcification of the coronary arteries.
Normal heart size. No pericardial effusion.

Mediastinum/Nodes: No enlarged mediastinal or axillary lymph nodes.
Thyroid gland, trachea, and esophagus demonstrate no significant
findings.

Lungs/Pleura: Stable areas of bandlike opacity in the lung bases
compatible with scarring. Dependent atelectasis is seen posteriorly.
No concerning consolidative process. No suspicious nodules or
masses. No pneumothorax. No effusion.

Musculoskeletal: No chest wall mass or suspicious bone lesions
identified. Slight exaggeration of the thoracic kyphosis similar to
prior.

CT ABDOMEN PELVIS FINDINGS

Hepatobiliary: No focal liver abnormality is seen. Focal
hyperattenuating thickening at the gallbladder fundus compatible
with adenomyomatosis. No gallstones, other gallbladder wall
thickening, or biliary dilatation.

Pancreas: Unremarkable. No pancreatic ductal dilatation or
surrounding inflammatory changes.

Spleen: Normal in size without focal abnormality.

Adrenals/Urinary Tract: Adrenal glands are normal and symmetric.

Few fluid attenuation cysts are present in both kidneys. Additional
intermediate attenuation cyst is seen in the left kidney, interpolar
region measuring up to 1.8 cm with a density 50 Hounsfield units
stable in size and attenuation from the noncontrast CT dated [DATE]. These compared to findings favor benign entity such as a
hyperdense or proteinaceous cyst. No hydronephrosis. Ureters are
normal course and caliber. Urinary bladder is unremarkable.

Stomach/Bowel: Distal esophagus, stomach and duodenal sweep are
unremarkable. No bowel wall thickening or dilatation. No evidence of
obstruction. A normal appendix is visualized. Scattered colonic
diverticula without focal pericolonic inflammation to suggest
diverticulitis.

Vascular/Lymphatic: Atherosclerotic plaque within the normal caliber
aorta. No suspicious or enlarged lymph nodes in the included
lymphatic chains.

Reproductive: Keyhole defect at the level of the central prostate
may reflect prior TURP. The prostate and seminal vesicles are
otherwise unremarkable.

Other: No abdominopelvic free fluid or free gas. No bowel containing
hernias.

Musculoskeletal: Multilevel degenerative changes are present in the
imaged portions of the spine. Findings are maximal at L5-S1. No
acute osseous abnormality or suspicious osseous lesion.
IMPRESSION: 1. No evidence of lymphadenopathy in the chest, abdomen, or pelvis.
2. Hyperattenuating cyst in the left kidney with lack of enhancement
postcontrast administration stable size since [PF] favoring benign
hyperdense/proteinaceous cyst. If felt to be clinically warranted
multiphase CT could be obtained.
3. Diverticulosis without evidence of diverticulitis.
4. Adenomyomatosis of the gallbladder fundus.
5. Extensive three-vessel coronary artery disease.
6. Stable dilation of the ascending aorta measuring up to 4.2 cm.
Recommend semi-annual imaging followup by CTA or MRA and referral to
cardiothoracic surgery if not already obtained. This recommendation
follows [PF] ACCF/AHA/AATS/ACR/ASA/SCA/BAZA/BAZA/BAZA/BAZA Guidelines
for the Diagnosis and Management of Patients With Thoracic Aortic
Disease. Circulation. [PF]; 121: E266-e36. Aortic aneurysm NOS
([PF]-[PF])
7. Aortic Atherosclerosis ([PF]-[PF]).

## 2018-11-10 IMAGING — CT CT CHEST WITH CONTRAST
2 of 5 series · 12 of 46 positions shown, 14 images · IV contrast (iopamidol)
Comparison: CT abdomen pelvis [DATE], CT chest [DATE]

CLINICAL DATA: Persistent diaphoresis at night, evaluate for
adenopathy. No history of malignancy

EXAM:
CT CHEST, ABDOMEN, AND PELVIS WITH CONTRAST
TECHNIQUE: Multidetector CT imaging of the chest, abdomen and pelvis was
performed following the standard protocol during bolus
administration of intravenous contrast.
CONTRAST:  80 mL [PF] IOPAMIDOL ([PF]) INJECTION 61%

[Series 2: cap with 5.00 br40 s3 axial · axial · 0.74mm/px · z∈[+1281,+1876]mm · 9 of 139 slices shown, 11 images]
[im 10/139  soft-tissue]
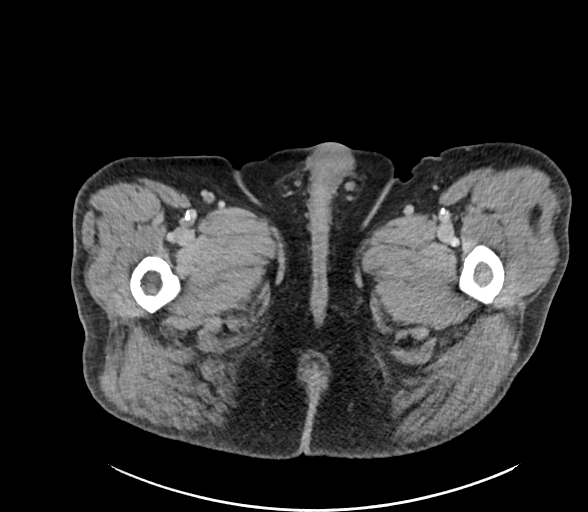
[im 10/139  bone]
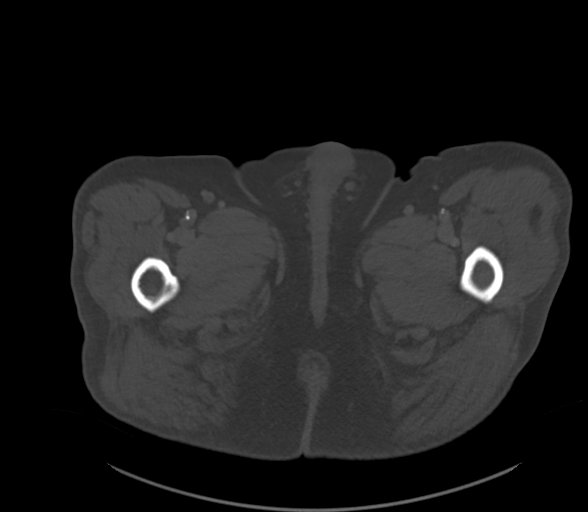
[im 30/139  soft-tissue]
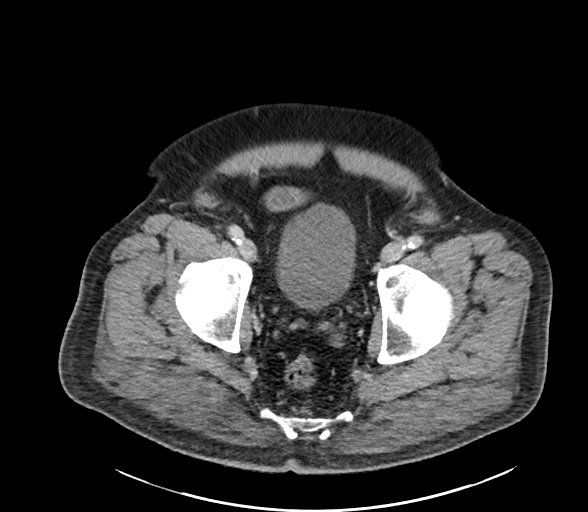
[im 40/139  soft-tissue]
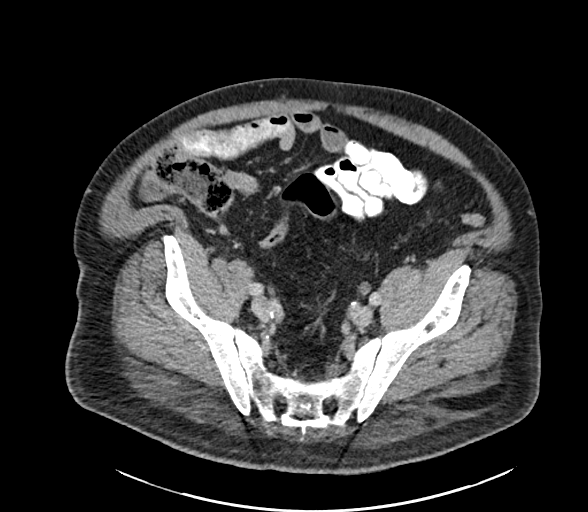
[im 60/139  soft-tissue]
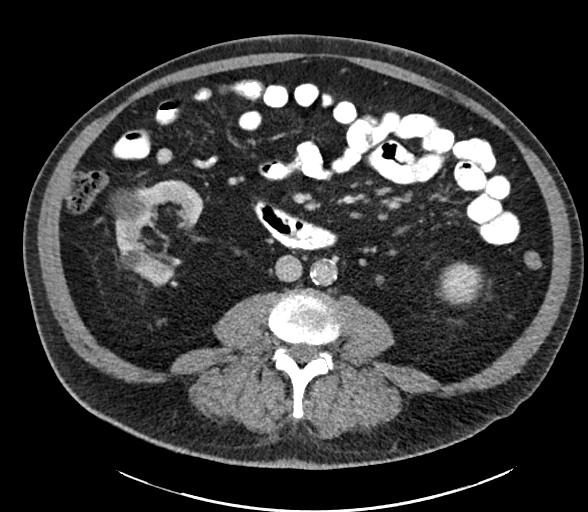
[im 70/139  soft-tissue]
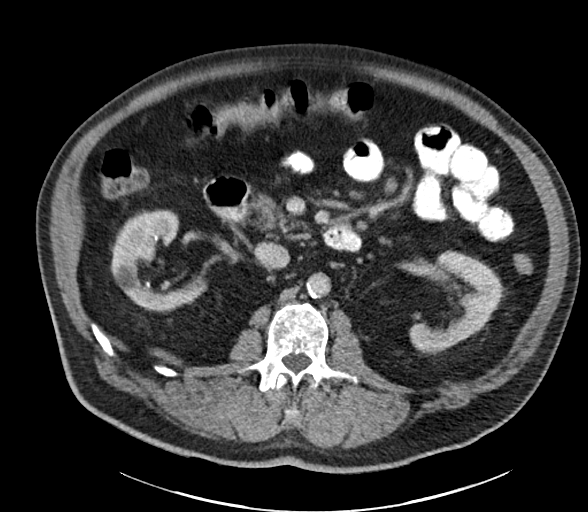
[im 79/139  soft-tissue]
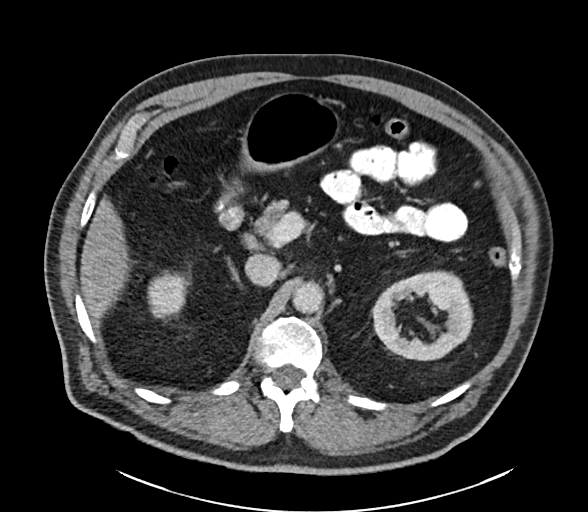
[im 99/139  soft-tissue]
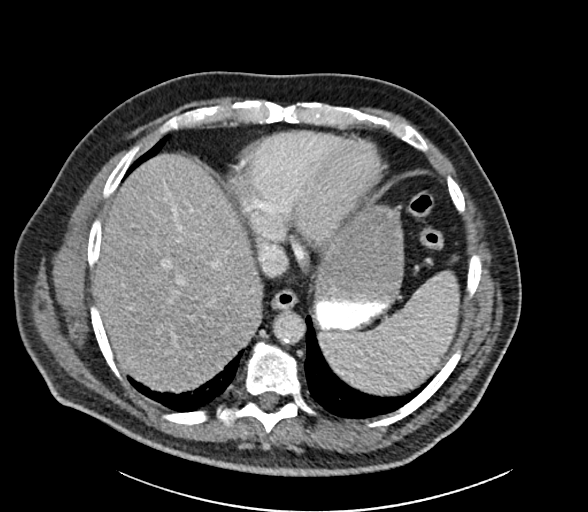
[im 109/139  soft-tissue]
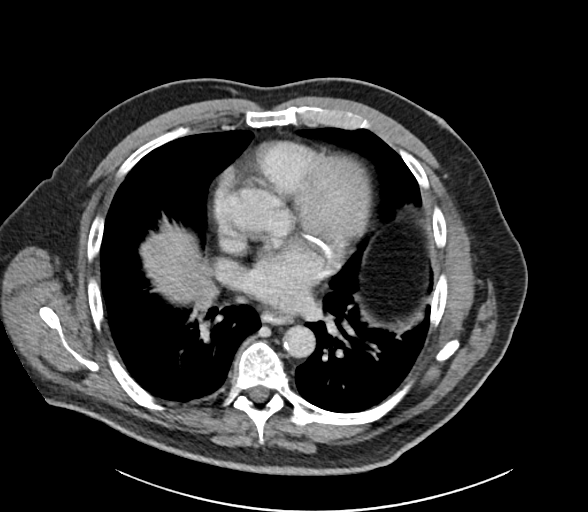
[im 129/139  soft-tissue]
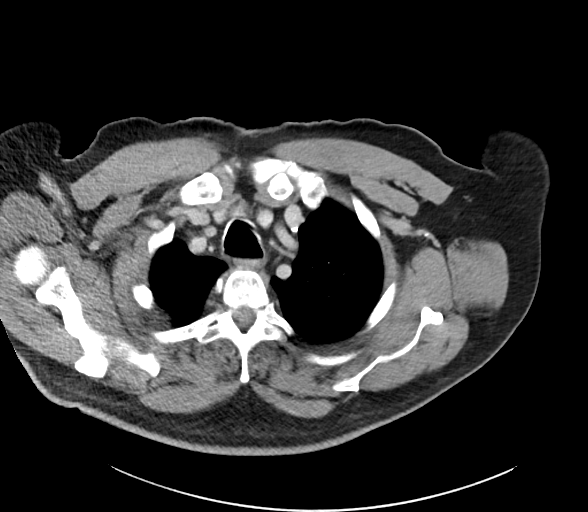
[im 129/139  bone]
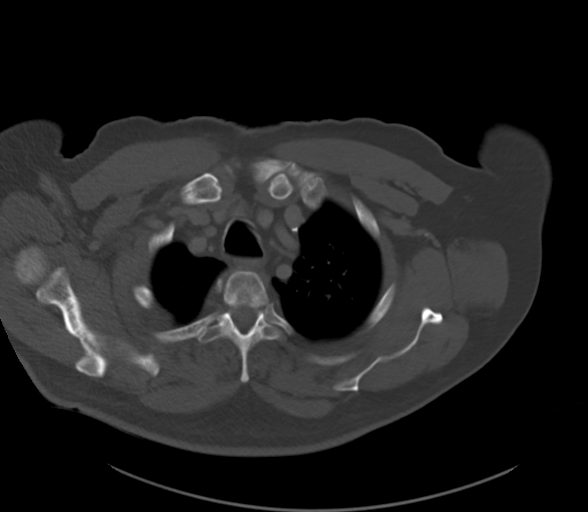

[Series 6: cap with 2.00 br40 s3 cor · coronal · 0.86mm/px · 3 of 190 slices shown]
[im 64/190  soft-tissue]
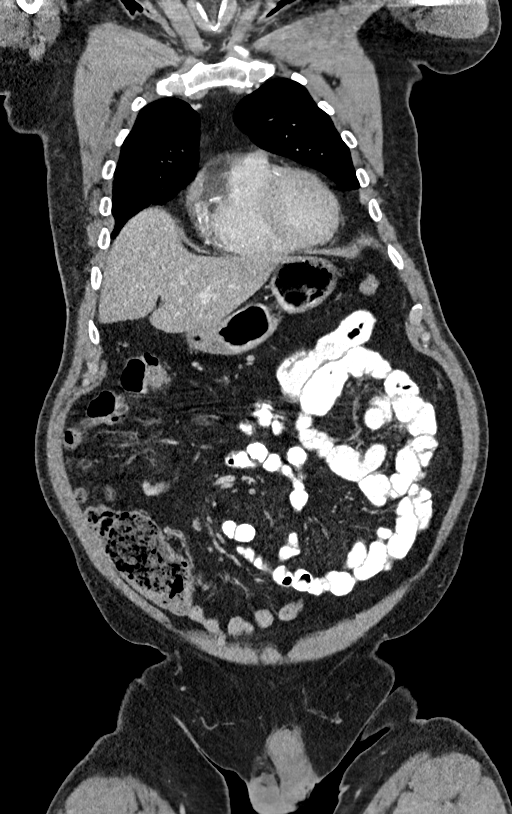
[im 85/190  soft-tissue]
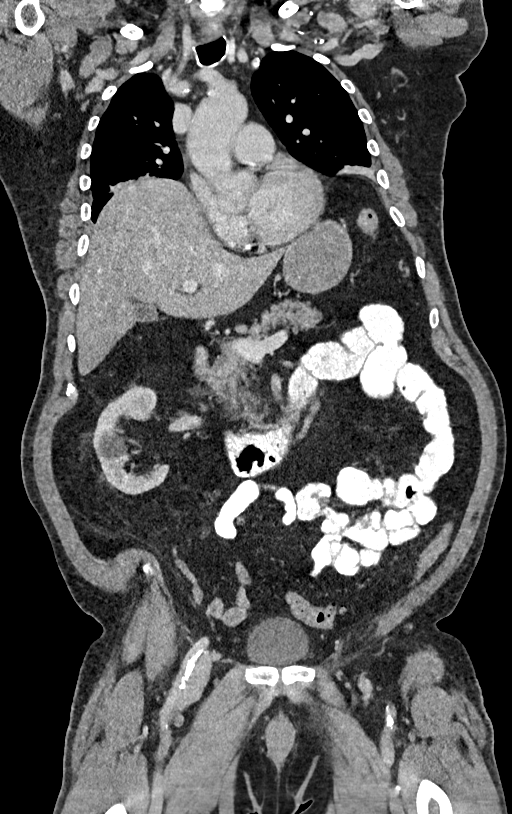
[im 106/190  soft-tissue]
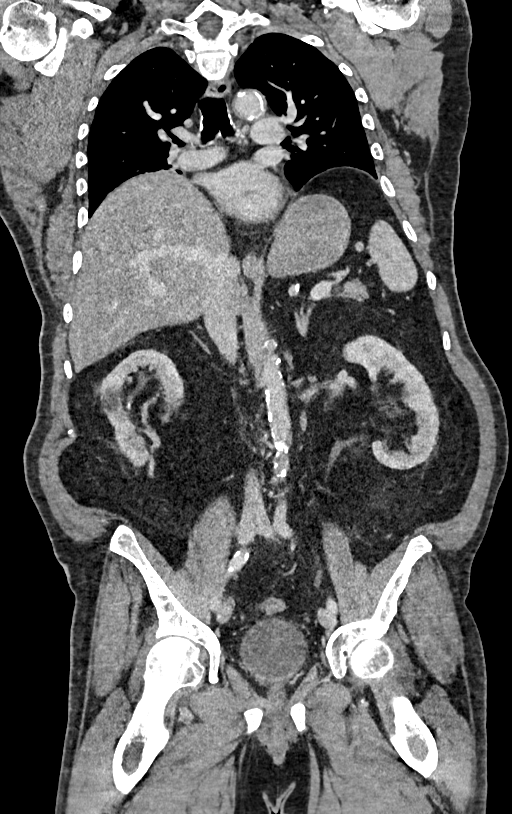

[12 of 46 positions shown; findings below may reference images not displayed]

FINDINGS: CT CHEST FINDINGS

Cardiovascular: Stable dilation of the ascending aorta measuring up
to 4.2 cm. Cardiac pulsation artifact limits evaluation of the
aortic root. There is preservation of the BAZA junction.
Extensive atherosclerotic calcification of the coronary arteries.
Normal heart size. No pericardial effusion.

Mediastinum/Nodes: No enlarged mediastinal or axillary lymph nodes.
Thyroid gland, trachea, and esophagus demonstrate no significant
findings.

Lungs/Pleura: Stable areas of bandlike opacity in the lung bases
compatible with scarring. Dependent atelectasis is seen posteriorly.
No concerning consolidative process. No suspicious nodules or
masses. No pneumothorax. No effusion.

Musculoskeletal: No chest wall mass or suspicious bone lesions
identified. Slight exaggeration of the thoracic kyphosis similar to
prior.

CT ABDOMEN PELVIS FINDINGS

Hepatobiliary: No focal liver abnormality is seen. Focal
hyperattenuating thickening at the gallbladder fundus compatible
with adenomyomatosis. No gallstones, other gallbladder wall
thickening, or biliary dilatation.

Pancreas: Unremarkable. No pancreatic ductal dilatation or
surrounding inflammatory changes.

Spleen: Normal in size without focal abnormality.

Adrenals/Urinary Tract: Adrenal glands are normal and symmetric.

Few fluid attenuation cysts are present in both kidneys. Additional
intermediate attenuation cyst is seen in the left kidney, interpolar
region measuring up to 1.8 cm with a density 50 Hounsfield units
stable in size and attenuation from the noncontrast CT dated [DATE]. These compared to findings favor benign entity such as a
hyperdense or proteinaceous cyst. No hydronephrosis. Ureters are
normal course and caliber. Urinary bladder is unremarkable.

Stomach/Bowel: Distal esophagus, stomach and duodenal sweep are
unremarkable. No bowel wall thickening or dilatation. No evidence of
obstruction. A normal appendix is visualized. Scattered colonic
diverticula without focal pericolonic inflammation to suggest
diverticulitis.

Vascular/Lymphatic: Atherosclerotic plaque within the normal caliber
aorta. No suspicious or enlarged lymph nodes in the included
lymphatic chains.

Reproductive: Keyhole defect at the level of the central prostate
may reflect prior TURP. The prostate and seminal vesicles are
otherwise unremarkable.

Other: No abdominopelvic free fluid or free gas. No bowel containing
hernias.

Musculoskeletal: Multilevel degenerative changes are present in the
imaged portions of the spine. Findings are maximal at L5-S1. No
acute osseous abnormality or suspicious osseous lesion.
IMPRESSION: 1. No evidence of lymphadenopathy in the chest, abdomen, or pelvis.
2. Hyperattenuating cyst in the left kidney with lack of enhancement
postcontrast administration stable size since [PF] favoring benign
hyperdense/proteinaceous cyst. If felt to be clinically warranted
multiphase CT could be obtained.
3. Diverticulosis without evidence of diverticulitis.
4. Adenomyomatosis of the gallbladder fundus.
5. Extensive three-vessel coronary artery disease.
6. Stable dilation of the ascending aorta measuring up to 4.2 cm.
Recommend semi-annual imaging followup by CTA or MRA and referral to
cardiothoracic surgery if not already obtained. This recommendation
follows [PF] ACCF/AHA/AATS/ACR/ASA/SCA/BAZA/BAZA/BAZA/BAZA Guidelines
for the Diagnosis and Management of Patients With Thoracic Aortic
Disease. Circulation. [PF]; 121: [id number]. Aortic aneurysm NOS
([PF]-[PF])
7. Aortic Atherosclerosis ([PF]-[PF]).

## 2018-11-10 MED ORDER — IOPAMIDOL (ISOVUE-300) INJECTION 61%
100.0000 mL | Freq: Once | INTRAVENOUS | Status: AC | PRN
  Administered 2018-11-10: 100 mL via INTRAVENOUS

## 2018-11-17 DIAGNOSIS — E291 Testicular hypofunction: Secondary | ICD-10-CM | POA: Diagnosis not present

## 2018-11-17 DIAGNOSIS — R8271 Bacteriuria: Secondary | ICD-10-CM | POA: Diagnosis not present

## 2018-11-17 DIAGNOSIS — N471 Phimosis: Secondary | ICD-10-CM | POA: Diagnosis not present

## 2018-11-17 DIAGNOSIS — C675 Malignant neoplasm of bladder neck: Secondary | ICD-10-CM | POA: Diagnosis not present

## 2018-11-17 DIAGNOSIS — B951 Streptococcus, group B, as the cause of diseases classified elsewhere: Secondary | ICD-10-CM | POA: Diagnosis not present

## 2018-11-17 DIAGNOSIS — N390 Urinary tract infection, site not specified: Secondary | ICD-10-CM | POA: Diagnosis not present

## 2018-11-18 ENCOUNTER — Ambulatory Visit (INDEPENDENT_AMBULATORY_CARE_PROVIDER_SITE_OTHER): Payer: Medicare Other | Admitting: Podiatry

## 2018-11-18 ENCOUNTER — Other Ambulatory Visit: Payer: Self-pay

## 2018-11-18 ENCOUNTER — Encounter: Payer: Self-pay | Admitting: Podiatry

## 2018-11-18 VITALS — Temp 97.8°F

## 2018-11-18 DIAGNOSIS — M79674 Pain in right toe(s): Secondary | ICD-10-CM | POA: Diagnosis not present

## 2018-11-18 DIAGNOSIS — E1142 Type 2 diabetes mellitus with diabetic polyneuropathy: Secondary | ICD-10-CM

## 2018-11-18 DIAGNOSIS — L84 Corns and callosities: Secondary | ICD-10-CM

## 2018-11-18 DIAGNOSIS — B351 Tinea unguium: Secondary | ICD-10-CM | POA: Diagnosis not present

## 2018-11-18 DIAGNOSIS — M79675 Pain in left toe(s): Secondary | ICD-10-CM

## 2018-11-18 NOTE — Patient Instructions (Signed)

## 2018-11-26 DIAGNOSIS — M19012 Primary osteoarthritis, left shoulder: Secondary | ICD-10-CM | POA: Diagnosis not present

## 2018-11-27 DIAGNOSIS — F410 Panic disorder [episodic paroxysmal anxiety] without agoraphobia: Secondary | ICD-10-CM | POA: Diagnosis not present

## 2018-11-29 NOTE — Progress Notes (Signed)
Subjective: Jason Hartman presents to clinic for preventative diabetic foot care. He c/o painful mycotic toenails and callus right foot which are aggravated when weightbearing with and without shoe gear.  This pain limits his daily activities. Pain symptoms resolve with periodic professional debridement.  He is seen monthly due to pain. He states his left foot callus feels better when he uses the Foot Miracle Cream.   His wife is present during the visit.   Current Outpatient Medications:  .  acetaminophen (TYLENOL) 500 MG tablet, Take by mouth., Disp: , Rfl:  .  acyclovir (ZOVIRAX) 800 MG tablet, Take by mouth., Disp: , Rfl:  .  allopurinol (ZYLOPRIM) 100 MG tablet, Take 2 tablets daily., Disp: 60 tablet, Rfl: 0 .  allopurinol (ZYLOPRIM) 100 MG tablet, TAKE 2 TABLETS BY MOUTH EVERY DAY, Disp: 60 tablet, Rfl: 0 .  allopurinol (ZYLOPRIM) 100 MG tablet, Take 1 tablet (100 mg total) by mouth daily., Disp: 60 tablet, Rfl: 0 .  allopurinol (ZYLOPRIM) 300 MG tablet, TK 1 T PO D UTD, Disp: , Rfl:  .  ALPRAZolam (XANAX) 0.5 MG tablet, Take 1 tab twice a day as needed for anxiety, Disp: , Rfl:  .  amantadine (SYMMETREL) 100 MG capsule, TK 1 C PO QHS FOR 3 DAYS THEN TK 1 C PO BID, Disp: , Rfl:  .  amLODipine (NORVASC) 5 MG tablet, , Disp: , Rfl:  .  aspirin EC 325 MG tablet, Take 325 mg by mouth daily. 1-2 daily prn, Disp: , Rfl:  .  BYSTOLIC 5 MG tablet, , Disp: , Rfl:  .  cetirizine (ZYRTEC) 10 MG tablet, Take by mouth., Disp: , Rfl:  .  chlorthalidone (HYGROTON) 25 MG tablet, Take by mouth., Disp: , Rfl:  .  ciprofloxacin (CIPRO) 500 MG tablet, TK 1 T PO BID, Disp: , Rfl: 0 .  clonazePAM (KLONOPIN) 0.5 MG tablet, Take 1 tab twice a day consistently, and 1 extra a day if needed (to replace xanax), Disp: , Rfl:  .  clotrimazole-betamethasone (LOTRISONE) cream, Apply to affected area twice daily, Disp: , Rfl:  .  co-enzyme Q-10 50 MG capsule, Take 50 mg by mouth daily., Disp: , Rfl:  .  colchicine  0.6 MG tablet, Take 1 tablet (0.6 mg total) by mouth daily., Disp: 10 tablet, Rfl: 0 .  colchicine 0.6 MG tablet, Take 1 tablet (0.6 mg total) by mouth daily., Disp: 10 tablet, Rfl: 0 .  desonide (DESOWEN) 0.05 % cream, Apply topically., Disp: , Rfl:  .  diclofenac sodium (VOLTAREN) 1 % GEL, Apply topically., Disp: , Rfl:  .  diltiazem (CARDIZEM CD) 120 MG 24 hr capsule, TK 1 C PO QD, Disp: , Rfl:  .  DIOVAN 320 MG tablet, , Disp: , Rfl:  .  DOCOSAHEXAENOIC ACID PO, Take 1 g by mouth., Disp: , Rfl:  .  famotidine (PEPCID) 40 MG tablet, Take by mouth., Disp: , Rfl:  .  ferrous sulfate 325 (65 FE) MG EC tablet, , Disp: , Rfl:  .  fluconazole (DIFLUCAN) 200 MG tablet, Take by mouth., Disp: , Rfl:  .  Flunisolide HFA (AEROSPAN) 80 MCG/ACT AERS, Inhale into the lungs., Disp: , Rfl:  .  fluticasone (FLONASE) 50 MCG/ACT nasal spray, Place into the nose., Disp: , Rfl:  .  fluvoxaMINE (LUVOX) 25 MG tablet, , Disp: , Rfl: 5 .  gabapentin (NEURONTIN) 600 MG tablet, TAKE 2 TABLETS BY MOUTH THREE TIMES DAILY, Disp: , Rfl:  .  Gabapentin Enacarbil (HORIZANT)  600 MG TB24, Take 600 mg by mouth 2 (two) times daily., Disp: 180 tablet, Rfl: 3 .  hydrochlorothiazide (HYDRODIURIL) 12.5 MG tablet, TK 1 T PO QD IN THE MORNING, Disp: , Rfl: 11 .  HYDROcodone-acetaminophen (NORCO) 10-325 MG per tablet, Take 1 tablet by mouth every 4 (four) hours as needed (for pain)., Disp: 30 tablet, Rfl: 0 .  HYDROcodone-acetaminophen (NORCO/VICODIN) 5-325 MG per tablet, Take 1-2 tablets by mouth daily. , Disp: , Rfl:  .  ketorolac (TORADOL) 10 MG tablet, Take 1 tablet (10 mg total) by mouth every 6 (six) hours as needed for moderate pain., Disp: 20 tablet, Rfl: 0 .  L-METHYLFOLATE CALCIUM PO, Take by mouth., Disp: , Rfl:  .  L-Methylfolate-Algae-B12-B6 (METANX) 3-90.314-2-35 MG CAPS, Take by mouth., Disp: , Rfl:  .  LANTUS SOLOSTAR 100 UNIT/ML SOPN, , Disp: , Rfl:  .  liraglutide (VICTOZA) 18 MG/3ML SOPN, Inject into the skin.,  Disp: , Rfl:  .  magnesium citrate SOLN, Take by mouth., Disp: , Rfl:  .  magnesium citrate SOLN, Take by mouth., Disp: , Rfl:  .  meloxicam (MOBIC) 15 MG tablet, Take 1 tablet (15 mg total) by mouth daily., Disp: 10 tablet, Rfl: 0 .  metFORMIN (GLUCOPHAGE-XR) 500 MG 24 hr tablet, , Disp: , Rfl:  .  methylPREDNISolone acetate (DEPO-MEDROL) 80 MG/ML injection, by Intra-Lesional route., Disp: , Rfl:  .  metoprolol succinate (TOPROL-XL) 25 MG 24 hr tablet, TK 1 T PO QD, Disp: , Rfl:  .  multivitamin (METANX) 3-35-2 MG TABS tablet, Take 1 tablet by mouth 2 (two) times daily., Disp: 180 tablet, Rfl: 3 .  nitroGLYCERIN (NITROSTAT) 0.4 MG SL tablet, Place under the tongue., Disp: , Rfl:  .  NONFORMULARY OR COMPOUNDED ITEM, [facility]:  Antiinflammatory cream - Diclofenac 3%, Baclofen 2%, Lidocaine 2%, apply 1-2 grams to affected area 3-4 times daily., Disp: 120 each, Rfl: 2 .  NOVOFINE 32G X 6 MM MISC, , Disp: , Rfl:  .  olmesartan (BENICAR) 40 MG tablet, TAKE 1 TABLET(40 MG) BY MOUTH DAILY, Disp: , Rfl:  .  Omega-3 1000 MG CAPS, Take by mouth., Disp: , Rfl:  .  omega-3 acid ethyl esters (LOVAZA) 1 G capsule, Take 2 g by mouth 2 (two) times daily. 4 Tabs qd, Disp: , Rfl:  .  ONE TOUCH ULTRA TEST test strip, , Disp: , Rfl:  .  ONETOUCH DELICA LANCETS 99991111 MISC, , Disp: , Rfl:  .  oxybutynin (DITROPAN) 5 MG tablet, TK 1 T PO BID, Disp: , Rfl:  .  OXYCONTIN 10 MG 12 hr tablet, TK 1 T PO Q 12 H FOR 5 DAYS, Disp: , Rfl:  .  Polyethylene Glycol 3350 (PEG 3350) POWD, Take by mouth., Disp: , Rfl:  .  predniSONE (DELTASONE) 5 MG tablet, , Disp: , Rfl: 0 .  rotigotine (NEUPRO) 6 MG/24HR, Place 1 patch onto the skin daily., Disp: 90 patch, Rfl: 3 .  STENDRA 200 MG TABS, as needed., Disp: , Rfl:  .  tamsulosin (FLOMAX) 0.4 MG CAPS capsule, Take one capsule daily until stone passes., Disp: 30 capsule, Rfl: 0 .  terazosin (HYTRIN) 2 MG capsule, Take by mouth., Disp: , Rfl:  .  testosterone cypionate  (DEPOTESTOTERONE CYPIONATE) 100 MG/ML injection, Inject into the muscle., Disp: , Rfl:  .  triamcinolone lotion (KENALOG) 0.1 %, , Disp: , Rfl:  .  Ubiquinol 100 MG CAPS, Take by mouth., Disp: , Rfl:  .  Urea 39 % CREA,  Apply topically as needed, Disp: , Rfl:  .  Vilazodone HCl (VIIBRYD) 10 MG TABS, Take 1/2 a day for a week, then 1 a day, Disp: , Rfl:    Allergies  Allergen Reactions  . Atorvastatin Itching and Other (See Comments)    Tired, weakness Tired, weakness TIRED Tired, weakness   . Colesevelam Other (See Comments)    tired TIRED tired   . Iodinated Diagnostic Agents Other (See Comments)    HIVES HIVES   . Iohexol      Code: HIVES, Desc: alleric to renografin,isovue & omnipaque, hives, requires 13 hr prep//a.calhoun, Onset Date: [id number]   . Lovastatin Other (See Comments)    Tired, nervousness TIRED Tired, nervousness   . Methadone Hcl Other (See Comments)    HALLUCINATIONS HALLUCINATIONS   . Morphine Sulfate   . Other Other (See Comments)    HIVES  . Oxycodone Hcl   . Penicillins   . Promethazine Other (See Comments)    Mental status change DELIRIUM Mental status change   . Quetiapine   . Rosuvastatin Other (See Comments)    Tired, weakness TIRED Tired, weakness   . Zolpidem Other (See Comments)  . Carbidopa-Levodopa Anxiety  . Clindamycin Rash  . Doxazosin Rash  . Iodine Other (See Comments) and Rash    HIVES  . Linezolid Other (See Comments) and Rash    HIVES      Objective: Vitals:   11/18/18 1523  Temp: 97.8 F (36.6 C)    Physical Examination:  Vascular  Examination: Capillary refill time < 3 seconds x 10 digits.  Palpable DP/PT pulses b/l.  Digital hair present b/l x 10.   No edema noted b/l.  Skin temperature gradient WNL b/l.  Dermatological Examination: Skin with normal turgor, texture and tone b/l.  No open wounds b/l.  No interdigital macerations noted b/l.  Elongated, thick, discolored brittle toenails  with subungual debris and pain on dorsal palpation of nailbeds 1-5 b/l.  Hyperkeratotic lesion submet head 5 left foot and right hallux with tenderness to palpation. No edema, no erythema, no drainage, no flocculence.  Musculoskeletal Examination: Muscle strength 5/5 to all muscle groups b/l.  No pain, crepitus or joint discomfort with active/passive ROM.  Neurological Examination: Sensation diminished b/l with 10 gram monofilament.  Assessment: 1. Mycotic nail infection with pain 1-5 b/l 2. Calluses submet head 5 left foot and right hallux 3. NIDDM with neuropathy  Plan: 1. Toenails 1-5 b/l were debrided in length and girth without iatrogenic laceration. 2. Calluses pared submetatarsal head 5 left foot and right hallux utilizing sterile scalpel blade without incident. 3. Continue soft, supportive shoe gear daily. 4. Report any pedal injuries to medical professional. 5. Follow up 5 weeks. 6. Patient/POA to call should there be a question/concern in there interim.

## 2018-12-01 DIAGNOSIS — C675 Malignant neoplasm of bladder neck: Secondary | ICD-10-CM | POA: Diagnosis not present

## 2018-12-01 DIAGNOSIS — E291 Testicular hypofunction: Secondary | ICD-10-CM | POA: Diagnosis not present

## 2018-12-08 DIAGNOSIS — C675 Malignant neoplasm of bladder neck: Secondary | ICD-10-CM | POA: Diagnosis not present

## 2018-12-15 DIAGNOSIS — E291 Testicular hypofunction: Secondary | ICD-10-CM | POA: Diagnosis not present

## 2018-12-15 DIAGNOSIS — C675 Malignant neoplasm of bladder neck: Secondary | ICD-10-CM | POA: Diagnosis not present

## 2018-12-28 DIAGNOSIS — E782 Mixed hyperlipidemia: Secondary | ICD-10-CM | POA: Diagnosis not present

## 2018-12-28 DIAGNOSIS — I10 Essential (primary) hypertension: Secondary | ICD-10-CM | POA: Diagnosis not present

## 2018-12-28 DIAGNOSIS — Z794 Long term (current) use of insulin: Secondary | ICD-10-CM | POA: Diagnosis not present

## 2018-12-28 DIAGNOSIS — Z6830 Body mass index (BMI) 30.0-30.9, adult: Secondary | ICD-10-CM | POA: Diagnosis not present

## 2018-12-28 DIAGNOSIS — E1159 Type 2 diabetes mellitus with other circulatory complications: Secondary | ICD-10-CM | POA: Diagnosis not present

## 2019-01-04 DIAGNOSIS — E291 Testicular hypofunction: Secondary | ICD-10-CM | POA: Diagnosis not present

## 2019-01-15 DIAGNOSIS — E7800 Pure hypercholesterolemia, unspecified: Secondary | ICD-10-CM | POA: Diagnosis not present

## 2019-01-15 DIAGNOSIS — I2510 Atherosclerotic heart disease of native coronary artery without angina pectoris: Secondary | ICD-10-CM | POA: Diagnosis not present

## 2019-01-15 DIAGNOSIS — I517 Cardiomegaly: Secondary | ICD-10-CM | POA: Diagnosis not present

## 2019-01-15 DIAGNOSIS — Z789 Other specified health status: Secondary | ICD-10-CM | POA: Diagnosis not present

## 2019-01-20 DIAGNOSIS — E113292 Type 2 diabetes mellitus with mild nonproliferative diabetic retinopathy without macular edema, left eye: Secondary | ICD-10-CM | POA: Diagnosis not present

## 2019-01-20 DIAGNOSIS — E291 Testicular hypofunction: Secondary | ICD-10-CM | POA: Diagnosis not present

## 2019-01-20 DIAGNOSIS — H43822 Vitreomacular adhesion, left eye: Secondary | ICD-10-CM | POA: Diagnosis not present

## 2019-01-20 DIAGNOSIS — H35373 Puckering of macula, bilateral: Secondary | ICD-10-CM | POA: Diagnosis not present

## 2019-01-22 ENCOUNTER — Other Ambulatory Visit: Payer: Self-pay

## 2019-01-22 ENCOUNTER — Ambulatory Visit (INDEPENDENT_AMBULATORY_CARE_PROVIDER_SITE_OTHER): Payer: Medicare Other | Admitting: Podiatry

## 2019-01-22 DIAGNOSIS — E1142 Type 2 diabetes mellitus with diabetic polyneuropathy: Secondary | ICD-10-CM

## 2019-01-22 DIAGNOSIS — B351 Tinea unguium: Secondary | ICD-10-CM | POA: Diagnosis not present

## 2019-01-22 DIAGNOSIS — M79674 Pain in right toe(s): Secondary | ICD-10-CM | POA: Diagnosis not present

## 2019-01-22 DIAGNOSIS — L84 Corns and callosities: Secondary | ICD-10-CM

## 2019-01-22 DIAGNOSIS — M79675 Pain in left toe(s): Secondary | ICD-10-CM | POA: Diagnosis not present

## 2019-01-28 ENCOUNTER — Encounter: Payer: Self-pay | Admitting: Podiatry

## 2019-01-28 NOTE — Progress Notes (Signed)
Subjective: Jason Hartman is seen today for follow up painful, elongated, thickened toenails 1-5 b/l feet that he cannot cut. Pain interferes with daily activities. Aggravating factor includes wearing enclosed shoe gear and relieved with periodic debridement.   His wife is present during the visit.  Current Outpatient Medications on File Prior to Visit  Medication Sig  . acetaminophen (TYLENOL) 500 MG tablet Take by mouth.  Marland Kitchen acyclovir (ZOVIRAX) 800 MG tablet Take by mouth.  Marland Kitchen allopurinol (ZYLOPRIM) 100 MG tablet Take 2 tablets daily.  Marland Kitchen allopurinol (ZYLOPRIM) 100 MG tablet TAKE 2 TABLETS BY MOUTH EVERY DAY  . allopurinol (ZYLOPRIM) 100 MG tablet Take 1 tablet (100 mg total) by mouth daily.  Marland Kitchen allopurinol (ZYLOPRIM) 300 MG tablet TK 1 T PO D UTD  . ALPRAZolam (XANAX) 0.5 MG tablet Take 1 tab twice a day as needed for anxiety  . amantadine (SYMMETREL) 100 MG capsule TK 1 C PO QHS FOR 3 DAYS THEN TK 1 C PO BID  . amLODipine (NORVASC) 5 MG tablet   . aspirin EC 325 MG tablet Take 325 mg by mouth daily. 1-2 daily prn  . BYSTOLIC 5 MG tablet   . cetirizine (ZYRTEC) 10 MG tablet Take by mouth.  . chlorthalidone (HYGROTON) 25 MG tablet Take by mouth.  . ciprofloxacin (CIPRO) 500 MG tablet TK 1 T PO BID  . clonazePAM (KLONOPIN) 0.5 MG tablet Take 1 tab twice a day consistently, and 1 extra a day if needed (to replace xanax)  . clotrimazole-betamethasone (LOTRISONE) cream Apply to affected area twice daily  . co-enzyme Q-10 50 MG capsule Take 50 mg by mouth daily.  . colchicine 0.6 MG tablet Take 1 tablet (0.6 mg total) by mouth daily.  . colchicine 0.6 MG tablet Take 1 tablet (0.6 mg total) by mouth daily.  Marland Kitchen desonide (DESOWEN) 0.05 % cream Apply topically.  . diclofenac sodium (VOLTAREN) 1 % GEL Apply topically.  Marland Kitchen diltiazem (CARDIZEM CD) 120 MG 24 hr capsule TK 1 C PO QD  . DIOVAN 320 MG tablet   . DOCOSAHEXAENOIC ACID PO Take 1 g by mouth.  . famotidine (PEPCID) 40 MG tablet Take by mouth.   . ferrous sulfate 325 (65 FE) MG EC tablet   . fluconazole (DIFLUCAN) 200 MG tablet Take by mouth.  . Flunisolide HFA (AEROSPAN) 80 MCG/ACT AERS Inhale into the lungs.  . fluticasone (FLONASE) 50 MCG/ACT nasal spray Place into the nose.  . fluvoxaMINE (LUVOX) 25 MG tablet   . gabapentin (NEURONTIN) 600 MG tablet TAKE 2 TABLETS BY MOUTH THREE TIMES DAILY  . Gabapentin Enacarbil (HORIZANT) 600 MG TB24 Take 600 mg by mouth 2 (two) times daily.  . hydrochlorothiazide (HYDRODIURIL) 12.5 MG tablet TK 1 T PO QD IN THE MORNING  . HYDROcodone-acetaminophen (NORCO) 10-325 MG per tablet Take 1 tablet by mouth every 4 (four) hours as needed (for pain).  Marland Kitchen HYDROcodone-acetaminophen (NORCO/VICODIN) 5-325 MG per tablet Take 1-2 tablets by mouth daily.   Marland Kitchen ketorolac (TORADOL) 10 MG tablet Take 1 tablet (10 mg total) by mouth every 6 (six) hours as needed for moderate pain.  . L-METHYLFOLATE CALCIUM PO Take by mouth.  Marland Kitchen L-Methylfolate-Algae-B12-B6 (METANX) 3-90.314-2-35 MG CAPS Take by mouth.  Marland Kitchen LANTUS SOLOSTAR 100 UNIT/ML SOPN   . liraglutide (VICTOZA) 18 MG/3ML SOPN Inject into the skin.  . magnesium citrate SOLN Take by mouth.  . magnesium citrate SOLN Take by mouth.  . meloxicam (MOBIC) 15 MG tablet Take 1 tablet (15 mg total)  by mouth daily.  . metFORMIN (GLUCOPHAGE-XR) 500 MG 24 hr tablet   . methylPREDNISolone acetate (DEPO-MEDROL) 80 MG/ML injection by Intra-Lesional route.  . metoprolol succinate (TOPROL-XL) 25 MG 24 hr tablet TK 1 T PO QD  . multivitamin (METANX) 3-35-2 MG TABS tablet Take 1 tablet by mouth 2 (two) times daily.  . nitroGLYCERIN (NITROSTAT) 0.4 MG SL tablet Place under the tongue.  . NONFORMULARY OR COMPOUNDED ITEM [facility]:  Antiinflammatory cream - Diclofenac 3%, Baclofen 2%, Lidocaine 2%, apply 1-2 grams to affected area 3-4 times daily.  Marland Kitchen NOVOFINE 32G X 6 MM MISC   . olmesartan (BENICAR) 40 MG tablet TAKE 1 TABLET(40 MG) BY MOUTH DAILY  . Omega-3 1000 MG CAPS Take  by mouth.  . omega-3 acid ethyl esters (LOVAZA) 1 G capsule Take 2 g by mouth 2 (two) times daily. 4 Tabs qd  . ONE TOUCH ULTRA TEST test strip   . ONETOUCH DELICA LANCETS 99991111 MISC   . oxybutynin (DITROPAN) 5 MG tablet TK 1 T PO BID  . OXYCONTIN 10 MG 12 hr tablet TK 1 T PO Q 12 H FOR 5 DAYS  . Polyethylene Glycol 3350 (PEG 3350) POWD Take by mouth.  . predniSONE (DELTASONE) 5 MG tablet   . rotigotine (NEUPRO) 6 MG/24HR Place 1 patch onto the skin daily.  Marland Kitchen STENDRA 200 MG TABS as needed.  . tamsulosin (FLOMAX) 0.4 MG CAPS capsule Take one capsule daily until stone passes.  Marland Kitchen terazosin (HYTRIN) 2 MG capsule Take by mouth.  . testosterone cypionate (DEPOTESTOTERONE CYPIONATE) 100 MG/ML injection Inject into the muscle.  . triamcinolone lotion (KENALOG) 0.1 %   . Ubiquinol 100 MG CAPS Take by mouth.  . Urea 39 % CREA Apply topically as needed  . Vilazodone HCl (VIIBRYD) 10 MG TABS Take 1/2 a day for a week, then 1 a day   No current facility-administered medications on file prior to visit.      Allergies  Allergen Reactions  . Atorvastatin Itching and Other (See Comments)    Tired, weakness Tired, weakness TIRED Tired, weakness   . Colesevelam Other (See Comments)    tired TIRED tired   . Iodinated Diagnostic Agents Other (See Comments)    HIVES HIVES   . Iohexol      Code: HIVES, Desc: alleric to renografin,isovue & omnipaque, hives, requires 13 hr prep//a.calhoun, Onset Date: [id number]   . Lovastatin Other (See Comments)    Tired, nervousness TIRED Tired, nervousness   . Methadone Hcl Other (See Comments)    HALLUCINATIONS HALLUCINATIONS   . Morphine Sulfate   . Other Other (See Comments)    HIVES  . Oxycodone Hcl   . Penicillins   . Promethazine Other (See Comments)    Mental status change DELIRIUM Mental status change   . Quetiapine   . Rosuvastatin Other (See Comments)    Tired, weakness TIRED Tired, weakness   . Zolpidem Other (See Comments)  .  Carbidopa-Levodopa Anxiety  . Clindamycin Rash  . Doxazosin Rash  . Iodine Other (See Comments) and Rash    HIVES  . Linezolid Other (See Comments) and Rash    HIVES      Objective:  Vascular Examination: Capillary refill time <3 seconds x 10 digits.  Dorsalis pedis present b/l.  Posterior tibial pulses present b/l.  Digital hair present x 10 digits.  Skin temperature gradient WNL b/l.   Dermatological Examination: Skin with normal turgor, texture and tone b/l  Toenails 1-5 b/l  discolored, thick, dystrophic with subungual debris and pain with palpation to nailbeds due to thickness of nails.  Hyperkeratotic lesion submet head 5 left foot with tenderness to palpation. No edema, no erythema, no drainage, no flocculence.  Musculoskeletal: Muscle strength 5/5 to all LE muscle groups b/l.  No gross bony deformities b/l.  No pain, crepitus or joint limitation noted with ROM.   Neurological Examination: Protective sensation diminished b/l with 10 gram monofilament bilaterally.  Assessment: Painful onychomycosis toenails 1-5 b/l  Callus submet head 5 left foot NIDDM with neuropathy  Plan: 1. Non-Medicare covered visit for: Toenails 1-5 b/l were debrided in length and girth without iatrogenic bleeding. Calluses pared submetatarsal head(s) 5 left foot utilizing sterile scalpel blade without incident. 2. Patient to continue soft, supportive shoe gear daily. 3. Patient to report any pedal injuries to medical professional immediately. 4. Follow up 4 weeks.  5. Patient/POA to call should there be a concern in the interim.

## 2019-02-03 DIAGNOSIS — G2581 Restless legs syndrome: Secondary | ICD-10-CM | POA: Diagnosis not present

## 2019-02-03 DIAGNOSIS — E1121 Type 2 diabetes mellitus with diabetic nephropathy: Secondary | ICD-10-CM | POA: Diagnosis not present

## 2019-02-03 DIAGNOSIS — H903 Sensorineural hearing loss, bilateral: Secondary | ICD-10-CM | POA: Diagnosis not present

## 2019-02-03 DIAGNOSIS — I129 Hypertensive chronic kidney disease with stage 1 through stage 4 chronic kidney disease, or unspecified chronic kidney disease: Secondary | ICD-10-CM | POA: Diagnosis not present

## 2019-02-03 DIAGNOSIS — N1832 Chronic kidney disease, stage 3b: Secondary | ICD-10-CM | POA: Diagnosis not present

## 2019-02-03 DIAGNOSIS — Z23 Encounter for immunization: Secondary | ICD-10-CM | POA: Diagnosis not present

## 2019-02-09 DIAGNOSIS — E291 Testicular hypofunction: Secondary | ICD-10-CM | POA: Diagnosis not present

## 2019-02-11 DIAGNOSIS — M19012 Primary osteoarthritis, left shoulder: Secondary | ICD-10-CM | POA: Diagnosis not present

## 2019-02-12 DIAGNOSIS — H0014 Chalazion left upper eyelid: Secondary | ICD-10-CM | POA: Diagnosis not present

## 2019-02-12 DIAGNOSIS — H0100B Unspecified blepharitis left eye, upper and lower eyelids: Secondary | ICD-10-CM | POA: Diagnosis not present

## 2019-02-12 DIAGNOSIS — H1032 Unspecified acute conjunctivitis, left eye: Secondary | ICD-10-CM | POA: Diagnosis not present

## 2019-02-12 DIAGNOSIS — H16202 Unspecified keratoconjunctivitis, left eye: Secondary | ICD-10-CM | POA: Diagnosis not present

## 2019-02-19 ENCOUNTER — Encounter: Payer: Self-pay | Admitting: Podiatry

## 2019-02-19 ENCOUNTER — Other Ambulatory Visit: Payer: Self-pay

## 2019-02-19 ENCOUNTER — Ambulatory Visit (INDEPENDENT_AMBULATORY_CARE_PROVIDER_SITE_OTHER): Payer: Medicare Other | Admitting: Podiatry

## 2019-02-19 DIAGNOSIS — M79674 Pain in right toe(s): Secondary | ICD-10-CM | POA: Diagnosis not present

## 2019-02-19 DIAGNOSIS — M79675 Pain in left toe(s): Secondary | ICD-10-CM | POA: Diagnosis not present

## 2019-02-19 DIAGNOSIS — B351 Tinea unguium: Secondary | ICD-10-CM | POA: Diagnosis not present

## 2019-02-25 DIAGNOSIS — E291 Testicular hypofunction: Secondary | ICD-10-CM | POA: Diagnosis not present

## 2019-02-25 NOTE — Progress Notes (Signed)
Subjective: Jason Hartman is seen today for follow up painful, elongated, thickened toenails 1-5 b/l feet that he cannot cut. Pain interferes with daily activities. Aggravating factor includes wearing enclosed shoe gear and relieved with periodic debridement.  Today is his self-pay visit.  Current Outpatient Medications on File Prior to Visit  Medication Sig  . acetaminophen (TYLENOL) 500 MG tablet Take by mouth.  Marland Kitchen acyclovir (ZOVIRAX) 800 MG tablet Take by mouth.  Marland Kitchen allopurinol (ZYLOPRIM) 100 MG tablet Take 2 tablets daily.  Marland Kitchen allopurinol (ZYLOPRIM) 100 MG tablet TAKE 2 TABLETS BY MOUTH EVERY DAY  . allopurinol (ZYLOPRIM) 100 MG tablet Take 1 tablet (100 mg total) by mouth daily.  Marland Kitchen allopurinol (ZYLOPRIM) 300 MG tablet TK 1 T PO D UTD  . ALPRAZolam (XANAX) 0.5 MG tablet Take 1 tab twice a day as needed for anxiety  . amantadine (SYMMETREL) 100 MG capsule TK 1 C PO QHS FOR 3 DAYS THEN TK 1 C PO BID  . amLODipine (NORVASC) 5 MG tablet   . aspirin EC 325 MG tablet Take 325 mg by mouth daily. 1-2 daily prn  . BYSTOLIC 5 MG tablet   . cetirizine (ZYRTEC) 10 MG tablet Take by mouth.  . chlorthalidone (HYGROTON) 25 MG tablet Take by mouth.  . ciprofloxacin (CIPRO) 500 MG tablet TK 1 T PO BID  . clonazePAM (KLONOPIN) 0.5 MG tablet Take 1 tab twice a day consistently, and 1 extra a day if needed (to replace xanax)  . clotrimazole-betamethasone (LOTRISONE) cream Apply to affected area twice daily  . co-enzyme Q-10 50 MG capsule Take 50 mg by mouth daily.  . colchicine 0.6 MG tablet Take 1 tablet (0.6 mg total) by mouth daily.  . colchicine 0.6 MG tablet Take 1 tablet (0.6 mg total) by mouth daily.  Marland Kitchen desonide (DESOWEN) 0.05 % cream Apply topically.  . diclofenac sodium (VOLTAREN) 1 % GEL Apply topically.  Marland Kitchen diltiazem (CARDIZEM CD) 120 MG 24 hr capsule TK 1 C PO QD  . DIOVAN 320 MG tablet   . DOCOSAHEXAENOIC ACID PO Take 1 g by mouth.  . erythromycin ophthalmic ointment APP TO LEFT EYELID MARGIN  BID FOR 1 WEEK THEN QD  . famotidine (PEPCID) 40 MG tablet Take by mouth.  . ferrous sulfate 325 (65 FE) MG EC tablet   . fluconazole (DIFLUCAN) 200 MG tablet Take by mouth.  . Flunisolide HFA (AEROSPAN) 80 MCG/ACT AERS Inhale into the lungs.  . fluticasone (FLONASE) 50 MCG/ACT nasal spray Place into the nose.  . fluvoxaMINE (LUVOX) 25 MG tablet   . gabapentin (NEURONTIN) 600 MG tablet TAKE 2 TABLETS BY MOUTH THREE TIMES DAILY  . Gabapentin Enacarbil (HORIZANT) 600 MG TB24 Take 600 mg by mouth 2 (two) times daily.  . hydrochlorothiazide (HYDRODIURIL) 12.5 MG tablet TK 1 T PO QD IN THE MORNING  . HYDROcodone-acetaminophen (NORCO) 10-325 MG per tablet Take 1 tablet by mouth every 4 (four) hours as needed (for pain).  Marland Kitchen HYDROcodone-acetaminophen (NORCO/VICODIN) 5-325 MG per tablet Take 1-2 tablets by mouth daily.   Marland Kitchen ketorolac (TORADOL) 10 MG tablet Take 1 tablet (10 mg total) by mouth every 6 (six) hours as needed for moderate pain.  . L-METHYLFOLATE CALCIUM PO Take by mouth.  Marland Kitchen L-Methylfolate-Algae-B12-B6 (METANX) 3-90.314-2-35 MG CAPS Take by mouth.  Marland Kitchen LANTUS SOLOSTAR 100 UNIT/ML SOPN   . liraglutide (VICTOZA) 18 MG/3ML SOPN Inject into the skin.  . magnesium citrate SOLN Take by mouth.  . magnesium citrate SOLN Take by mouth.  Marland Kitchen  meloxicam (MOBIC) 15 MG tablet Take 1 tablet (15 mg total) by mouth daily.  . metFORMIN (GLUCOPHAGE-XR) 500 MG 24 hr tablet   . methylPREDNISolone acetate (DEPO-MEDROL) 80 MG/ML injection by Intra-Lesional route.  . metoprolol succinate (TOPROL-XL) 25 MG 24 hr tablet TK 1 T PO QD  . multivitamin (METANX) 3-35-2 MG TABS tablet Take 1 tablet by mouth 2 (two) times daily.  . nitroGLYCERIN (NITROSTAT) 0.4 MG SL tablet Place under the tongue.  . NONFORMULARY OR COMPOUNDED ITEM [facility]:  Antiinflammatory cream - Diclofenac 3%, Baclofen 2%, Lidocaine 2%, apply 1-2 grams to affected area 3-4 times daily.  Marland Kitchen NOVOFINE 32G X 6 MM MISC   . ofloxacin (OCUFLOX) 0.3 %  ophthalmic solution SMARTSIG:1-2 Drop(s) Left Eye 4 Times Daily  . olmesartan (BENICAR) 40 MG tablet TAKE 1 TABLET(40 MG) BY MOUTH DAILY  . Omega-3 1000 MG CAPS Take by mouth.  . omega-3 acid ethyl esters (LOVAZA) 1 G capsule Take 2 g by mouth 2 (two) times daily. 4 Tabs qd  . ONE TOUCH ULTRA TEST test strip   . ONETOUCH DELICA LANCETS 99991111 MISC   . oxybutynin (DITROPAN) 5 MG tablet TK 1 T PO BID  . OXYCONTIN 10 MG 12 hr tablet TK 1 T PO Q 12 H FOR 5 DAYS  . Polyethylene Glycol 3350 (PEG 3350) POWD Take by mouth.  . predniSONE (DELTASONE) 5 MG tablet   . rotigotine (NEUPRO) 6 MG/24HR Place 1 patch onto the skin daily.  Marland Kitchen STENDRA 200 MG TABS as needed.  . tamsulosin (FLOMAX) 0.4 MG CAPS capsule Take one capsule daily until stone passes.  Marland Kitchen terazosin (HYTRIN) 2 MG capsule Take by mouth.  . testosterone cypionate (DEPOTESTOTERONE CYPIONATE) 100 MG/ML injection Inject into the muscle.  . triamcinolone lotion (KENALOG) 0.1 %   . Ubiquinol 100 MG CAPS Take by mouth.  . Urea 39 % CREA Apply topically as needed  . Vilazodone HCl (VIIBRYD) 10 MG TABS Take 1/2 a day for a week, then 1 a day   No current facility-administered medications on file prior to visit.      Allergies  Allergen Reactions  . Atorvastatin Itching and Other (See Comments)    Tired, weakness Tired, weakness TIRED Tired, weakness   . Colesevelam Other (See Comments)    tired TIRED tired   . Iodinated Diagnostic Agents Other (See Comments)    HIVES HIVES   . Iohexol      Code: HIVES, Desc: alleric to renografin,isovue & omnipaque, hives, requires 13 hr prep//a.calhoun, Onset Date: [id number]   . Lovastatin Other (See Comments)    Tired, nervousness TIRED Tired, nervousness   . Methadone Hcl Other (See Comments)    HALLUCINATIONS HALLUCINATIONS   . Morphine Sulfate   . Other Other (See Comments)    HIVES  . Oxycodone Hcl   . Penicillins   . Promethazine Other (See Comments)    Mental status  change DELIRIUM Mental status change   . Quetiapine   . Rosuvastatin Other (See Comments)    Tired, weakness TIRED Tired, weakness   . Zolpidem Other (See Comments)  . Carbidopa-Levodopa Anxiety  . Clindamycin Rash  . Doxazosin Rash  . Iodine Other (See Comments) and Rash    HIVES  . Linezolid Other (See Comments) and Rash    HIVES    Objective:  Vascular Examination: Capillary refill time <3 seconds b/l.  Dorsalis pedis present b/l.  Posterior tibial pulses present b/l.  +Digital hair b/l.  Skin temperature  gradient WNL b/l.   Dermatological Examination: Skin with normal turgor, texture and tone b/l.  Toenails 1-5 b/l discolored, thick, dystrophic with subungual debris and pain with palpation to nailbeds due to thickness of nails.  Musculoskeletal: Muscle strength 5/5 to all LE muscle groups b/l.  No gross bony deformities b/l.  No pain, crepitus or joint limitation noted with ROM.   Neurological Examination: Protective sensation absent with 10 gram monofilament bilaterally.  Assessment: Painful onychomycosis toenails 1-5 b/l   Plan: 1. Toenails 1-5 b/l were debrided in length and girth without iatrogenic bleeding. 2. Patient to continue soft, supportive shoe gear 3. Patient to report any pedal injuries to medical professional immediately. 4. Follow up 5 weeks for Medicare visit. 5. Patient/POA to call should there be a concern in the interim.

## 2019-03-11 DIAGNOSIS — E291 Testicular hypofunction: Secondary | ICD-10-CM | POA: Diagnosis not present

## 2019-03-29 ENCOUNTER — Ambulatory Visit: Payer: Medicare Other | Admitting: Podiatry

## 2019-04-07 ENCOUNTER — Ambulatory Visit (INDEPENDENT_AMBULATORY_CARE_PROVIDER_SITE_OTHER): Payer: Medicare Other | Admitting: Podiatry

## 2019-04-07 ENCOUNTER — Other Ambulatory Visit: Payer: Self-pay

## 2019-04-07 ENCOUNTER — Encounter: Payer: Self-pay | Admitting: Podiatry

## 2019-04-07 DIAGNOSIS — E1142 Type 2 diabetes mellitus with diabetic polyneuropathy: Secondary | ICD-10-CM | POA: Diagnosis not present

## 2019-04-07 DIAGNOSIS — M79675 Pain in left toe(s): Secondary | ICD-10-CM

## 2019-04-07 DIAGNOSIS — M79674 Pain in right toe(s): Secondary | ICD-10-CM | POA: Diagnosis not present

## 2019-04-07 DIAGNOSIS — B351 Tinea unguium: Secondary | ICD-10-CM | POA: Diagnosis not present

## 2019-04-07 NOTE — Progress Notes (Signed)
Complaint:  Visit Type: Patient returns to my office for continued preventative foot care services. Complaint: Patient states" my nails have grown long and thick and become painful to walk and wear shoes" Patient has been diagnosed with DM with diabetic peripheral neuropathy.. The patient presents for preventative foot care services.  Podiatric Exam: Vascular: dorsalis pedis and posterior tibial pulses are palpable bilateral. Capillary return is immediate. Temperature gradient is WNL. Skin turgor WNL  Sensorium: Sbsent  Semmes Weinstein monofilament test. Normal tactile sensation bilaterally. Nail Exam: Pt has thick disfigured discolored nails with subungual debris noted bilateral entire nail hallux through fifth toenails Ulcer Exam: There is no evidence of ulcer or pre-ulcerative changes or infection. Orthopedic Exam: Muscle tone and strength are WNL. No limitations in general ROM. No crepitus or effusions noted. Foot type and digits show no abnormalities. Bony prominences are unremarkable. Skin: No Porokeratosis. No infection or ulcers  Diagnosis:  Onychomycosis, , Pain in right toe, pain in left toes  Treatment & Plan Procedures and Treatment: Consent by patient was obtained for treatment procedures.   Debridement of mycotic and hypertrophic toenails, 1 through 5 bilateral and clearing of subungual debris. No ulceration, no infection noted.  Return Visit-Office Procedure: Patient instructed to return to the office for a follow up visit 6 weeks  for continued evaluation and treatment.    Gardiner Barefoot DPM

## 2019-04-12 DIAGNOSIS — E291 Testicular hypofunction: Secondary | ICD-10-CM | POA: Diagnosis not present

## 2019-04-25 ENCOUNTER — Observation Stay (HOSPITAL_BASED_OUTPATIENT_CLINIC_OR_DEPARTMENT_OTHER)
Admission: EM | Admit: 2019-04-25 | Discharge: 2019-04-27 | Disposition: A | Discharge status: Home / Self Care | Payer: Medicare Other | Attending: Internal Medicine | Admitting: Internal Medicine

## 2019-04-25 ENCOUNTER — Encounter (HOSPITAL_BASED_OUTPATIENT_CLINIC_OR_DEPARTMENT_OTHER): Payer: Self-pay

## 2019-04-25 ENCOUNTER — Other Ambulatory Visit: Payer: Self-pay

## 2019-04-25 DIAGNOSIS — E860 Dehydration: Secondary | ICD-10-CM | POA: Diagnosis not present

## 2019-04-25 DIAGNOSIS — G4733 Obstructive sleep apnea (adult) (pediatric): Secondary | ICD-10-CM | POA: Diagnosis not present

## 2019-04-25 DIAGNOSIS — Z881 Allergy status to other antibiotic agents status: Secondary | ICD-10-CM | POA: Insufficient documentation

## 2019-04-25 DIAGNOSIS — Z888 Allergy status to other drugs, medicaments and biological substances status: Secondary | ICD-10-CM | POA: Insufficient documentation

## 2019-04-25 DIAGNOSIS — Z87891 Personal history of nicotine dependence: Secondary | ICD-10-CM | POA: Diagnosis not present

## 2019-04-25 DIAGNOSIS — G20 Parkinson's disease: Secondary | ICD-10-CM | POA: Diagnosis not present

## 2019-04-25 DIAGNOSIS — Z79899 Other long term (current) drug therapy: Secondary | ICD-10-CM | POA: Diagnosis not present

## 2019-04-25 DIAGNOSIS — Z880 Allergy status to penicillin: Secondary | ICD-10-CM | POA: Insufficient documentation

## 2019-04-25 DIAGNOSIS — E1165 Type 2 diabetes mellitus with hyperglycemia: Secondary | ICD-10-CM | POA: Diagnosis not present

## 2019-04-25 DIAGNOSIS — M1990 Unspecified osteoarthritis, unspecified site: Secondary | ICD-10-CM | POA: Insufficient documentation

## 2019-04-25 DIAGNOSIS — I119 Hypertensive heart disease without heart failure: Secondary | ICD-10-CM | POA: Diagnosis present

## 2019-04-25 DIAGNOSIS — Z885 Allergy status to narcotic agent status: Secondary | ICD-10-CM | POA: Diagnosis not present

## 2019-04-25 DIAGNOSIS — K760 Fatty (change of) liver, not elsewhere classified: Secondary | ICD-10-CM | POA: Insufficient documentation

## 2019-04-25 DIAGNOSIS — I160 Hypertensive urgency: Secondary | ICD-10-CM | POA: Diagnosis not present

## 2019-04-25 DIAGNOSIS — K5730 Diverticulosis of large intestine without perforation or abscess without bleeding: Secondary | ICD-10-CM | POA: Diagnosis not present

## 2019-04-25 DIAGNOSIS — E785 Hyperlipidemia, unspecified: Secondary | ICD-10-CM | POA: Insufficient documentation

## 2019-04-25 DIAGNOSIS — G2581 Restless legs syndrome: Secondary | ICD-10-CM | POA: Diagnosis not present

## 2019-04-25 DIAGNOSIS — R7989 Other specified abnormal findings of blood chemistry: Secondary | ICD-10-CM | POA: Diagnosis not present

## 2019-04-25 DIAGNOSIS — E1142 Type 2 diabetes mellitus with diabetic polyneuropathy: Secondary | ICD-10-CM

## 2019-04-25 DIAGNOSIS — R112 Nausea with vomiting, unspecified: Secondary | ICD-10-CM

## 2019-04-25 DIAGNOSIS — Z7982 Long term (current) use of aspirin: Secondary | ICD-10-CM | POA: Insufficient documentation

## 2019-04-25 DIAGNOSIS — R945 Abnormal results of liver function studies: Secondary | ICD-10-CM

## 2019-04-25 DIAGNOSIS — Z794 Long term (current) use of insulin: Secondary | ICD-10-CM | POA: Diagnosis not present

## 2019-04-25 DIAGNOSIS — F419 Anxiety disorder, unspecified: Secondary | ICD-10-CM | POA: Insufficient documentation

## 2019-04-25 DIAGNOSIS — R778 Other specified abnormalities of plasma proteins: Secondary | ICD-10-CM | POA: Diagnosis not present

## 2019-04-25 DIAGNOSIS — I2510 Atherosclerotic heart disease of native coronary artery without angina pectoris: Secondary | ICD-10-CM | POA: Insufficient documentation

## 2019-04-25 DIAGNOSIS — J9811 Atelectasis: Secondary | ICD-10-CM | POA: Diagnosis not present

## 2019-04-25 DIAGNOSIS — Z20822 Contact with and (suspected) exposure to covid-19: Secondary | ICD-10-CM | POA: Insufficient documentation

## 2019-04-25 DIAGNOSIS — Z833 Family history of diabetes mellitus: Secondary | ICD-10-CM | POA: Insufficient documentation

## 2019-04-25 DIAGNOSIS — K219 Gastro-esophageal reflux disease without esophagitis: Secondary | ICD-10-CM | POA: Insufficient documentation

## 2019-04-25 DIAGNOSIS — R9431 Abnormal electrocardiogram [ECG] [EKG]: Secondary | ICD-10-CM | POA: Diagnosis not present

## 2019-04-25 MED ORDER — LORAZEPAM 2 MG/ML IJ SOLN
0.5000 mg | Freq: Once | INTRAMUSCULAR | Status: AC
  Administered 2019-04-25: 0.5 mg via INTRAVENOUS
  Filled 2019-04-25: qty 1

## 2019-04-25 MED ORDER — ONDANSETRON HCL 4 MG/2ML IJ SOLN
4.0000 mg | Freq: Once | INTRAMUSCULAR | Status: AC
  Administered 2019-04-25: 4 mg via INTRAVENOUS
  Filled 2019-04-25: qty 2

## 2019-04-25 NOTE — ED Notes (Signed)
Pt reports vomiting X 2 since yesterday.  Denies any diarrhea or pain.

## 2019-04-25 NOTE — ED Triage Notes (Signed)
Pt screaming "I'm sick." Pt refuses to explain. When questioned further. Pt has had N/V since yesterday. Denies CP, ShOB, or fever.

## 2019-04-25 NOTE — ED Provider Notes (Addendum)
[place] EMERGENCY DEPARTMENT Provider Note   CSN: [id number] Arrival date & time: 04/25/19  2306     History Chief Complaint  Patient presents with  . Emesis    Jason Hartman is a 84 y.o. male.  HPI     This is an 84 year old male with a history of hypertension, restless leg, diabetes who presents with nausea and vomiting.  Patient reports onset of symptoms yesterday.  He is very anxious on exam.  He recurrently states, "please help me" and "don't leave me."  He will not elaborate on his symptoms.  He denies sick contacts.  Denies fevers.  Denies chest pain, shortness of breath, abdominal pain, diarrhea.  He cannot identify anything that makes his symptoms better or worse.  He has not taken anything for his symptoms.  Denies Covid exposures.  Past Medical History:  Diagnosis Date  . Cellulitis and abscess of leg, except foot   . Hypertension   . Obstructive sleep apnea (adult) (pediatric)   . Pain in joint, pelvic region and thigh   . Pneumonia, organism unspecified(486)   . Restless legs syndrome (RLS)   . RLS (restless legs syndrome) 09/01/2012  . Thoracic or lumbosacral neuritis or radiculitis, unspecified   . Type II or unspecified type diabetes mellitus without mention of complication, not stated as uncontrolled   . Unspecified disease of pericardium   . Unspecified hereditary and idiopathic peripheral neuropathy     Patient Active Problem List   Diagnosis Date Noted  . Hypertensive urgency 04/26/2019  . Malignant HTN with heart disease, w/o CHF, w/o chronic kidney disease 04/26/2019  . Elevated troponin   . LFTs abnormal   . Penis pain 04/10/2018  . Urethra cancer ([place]) 02/25/2018  . Lower extremity edema 06/09/2017  . Peptic ulcer disease 05/16/2017  . GERD (gastroesophageal reflux disease) 05/16/2017  . Chronic kidney disease, stage 3 04/11/2017  . Lower urinary tract symptoms (LUTS) 04/15/2016  . Narcotic dependence ([place]) 08/31/2015  .  Imbalance 08/31/2015  . Diabetic peripheral neuropathy ([place]) 08/31/2015  . Other malaise and fatigue 05/02/2015  . Chronic fatigue 05/02/2015  . Panic disorder without agoraphobia 03/22/2015  . Hyperlipidemia 02/03/2014  . Thrombocytopenia ([place]) 08/18/2013  . Parkinson disease ([place]) 08/18/2013  . Other disorders of lung 08/18/2013  . Organic impotence 08/18/2013  . Memory loss 08/18/2013  . Low back pain 08/18/2013  . Iron deficiency anemia 08/18/2013  . Hypercalcemia 08/18/2013  . Cellulitis of right leg 08/18/2013  . Atherosclerotic heart disease of native coronary artery without angina pectoris 08/18/2013  . RLS (restless legs syndrome) 09/01/2012  . HERPES SIMPLEX INFECTION 11/06/2006  . DIABETES MELLITUS, TYPE II 11/06/2006  . HYPOGONADISM 11/06/2006  . VITAMIN D DEFICIENCY 11/06/2006  . ANXIETY 11/06/2006  . OBSTRUCTIVE SLEEP APNEA 11/06/2006  . RESTLESS LEG SYNDROME 11/06/2006  . HYPERTENSION 11/06/2006  . BENIGN PROSTATIC HYPERTROPHY 11/06/2006  . ROSACEA 11/06/2006  . OSTEOARTHRITIS 11/06/2006  . INSOMNIA 11/06/2006  . HYPERGLYCEMIA 11/06/2006  . COLONIC POLYPS, HX OF 11/06/2006    Past Surgical History:  Procedure Laterality Date  . ANAL FISSURE REPAIR    . pericarditis  2009       Family History  Problem Relation Age of Onset  . Diabetes Father     Social History   Tobacco Use  . Smoking status: Former Smoker    Quit date: 09/01/1980    Years since quitting: 38.6  . Smokeless tobacco: Never Used  Substance Use Topics  . Alcohol  use: No  . Drug use: No    Home Medications Prior to Admission medications   Medication Sig Start Date End Date Taking? Authorizing Provider  ALPRAZolam Duanne Moron) 0.5 MG tablet Take 0.5 mg by mouth 2 (two) times daily as needed for anxiety.  10/15/16  Yes [provider]  cetirizine (ZYRTEC) 10 MG tablet Take 10 mg by mouth daily as needed for allergies.    Yes [provider]  co-enzyme Q-10 50 MG capsule  Take 50 mg by mouth daily.   Yes [provider]  diltiazem (CARDIZEM CD) 120 MG 24 hr capsule Take 120 mg by mouth daily.  10/19/18  Yes [provider]  famotidine (PEPCID) 40 MG tablet Take 40 mg by mouth daily.  04/20/14  Yes [provider]  ferrous sulfate 325 (65 FE) MG EC tablet Take 325 mg by mouth daily.  05/28/18  Yes [provider]  fluticasone (FLONASE) 50 MCG/ACT nasal spray Place 2 sprays into the nose daily.  04/11/14  Yes [provider]  HYDROcodone-acetaminophen (NORCO/VICODIN) 5-325 MG per tablet Take 1 tablet by mouth at bedtime as needed for moderate pain.  06/30/12  Yes [provider]  L-Methylfolate-Algae-B12-B6 Glade Stanford) 3-90.314-2-35 MG CAPS Take by mouth. 06/11/17  Yes [provider]  LANTUS SOLOSTAR 100 UNIT/ML SOPN Inject 85 Units into the skin 2 (two) times daily.  08/22/12  Yes [provider]  liraglutide (VICTOZA) 18 MG/3ML SOPN Inject 1.2 mg into the skin daily.  09/16/16 04/26/19 Yes [provider]  metFORMIN (GLUCOPHAGE-XR) 500 MG 24 hr tablet  07/06/12  Yes [provider]  valsartan (DIOVAN) 320 MG tablet Take 320 mg by mouth daily.   Yes [provider]  allopurinol (ZYLOPRIM) 100 MG tablet Take 2 tablets daily. Patient not taking: Reported on 04/26/2019 02/03/17   Trula Slade, DPM  aspirin EC 81 MG tablet Take 1 tablet (81 mg total) by mouth daily. 04/27/19 04/26/20  Geradine Girt, DO  gabapentin (NEURONTIN) 600 MG tablet Take 1 tablet (600 mg total) by mouth 3 (three) times daily. 04/27/19   Eulogio Bear U, DO  NEUPRO 8 MG/24HR PT24 Place 1 patch onto the skin daily. 04/27/19   Geradine Girt, DO    Allergies    Atorvastatin, Colesevelam, Iodinated diagnostic agents, Iohexol, Lovastatin, Methadone hcl, Morphine sulfate, Other, Oxycodone hcl, Penicillins, Promethazine, Quetiapine, Rosuvastatin, Zolpidem, Carbidopa-levodopa, Clindamycin, Doxazosin, Iodine, and  Linezolid  Review of Systems   Review of Systems  Constitutional: Negative for fever.  Respiratory: Negative for shortness of breath.   Cardiovascular: Negative for chest pain.  Gastrointestinal: Positive for nausea and vomiting. Negative for abdominal pain and diarrhea.  Genitourinary: Negative for dysuria.  Psychiatric/Behavioral: The patient is nervous/anxious.   All other systems reviewed and are negative.   Physical Exam Updated Vital Signs BP (!) 143/81   Pulse 88   Temp 98.4 F (36.9 C) (Oral)   Resp 18   Ht 1.753 m (5\' 9" )   Wt 97.1 kg   SpO2 94%   BMI 31.60 kg/m   Physical Exam Vitals and nursing note reviewed.  Constitutional:      Appearance: He is well-developed.     Comments: Anxious appearing, nontoxic  HENT:     Head: Normocephalic and atraumatic.     Mouth/Throat:     Mouth: Mucous membranes are moist.  Eyes:     Extraocular Movements: Extraocular movements intact.     Pupils: Pupils are equal, round, and reactive  to light.  Cardiovascular:     Rate and Rhythm: Regular rhythm. Tachycardia present.     Heart sounds: Normal heart sounds. No murmur.  Pulmonary:     Effort: Pulmonary effort is normal. No respiratory distress.     Breath sounds: Normal breath sounds. No wheezing.  Abdominal:     General: Bowel sounds are normal.     Palpations: Abdomen is soft.     Tenderness: There is abdominal tenderness. There is no rebound.     Comments: Diffuse tenderness to palpation, no rebound or guarding, no point tenderness or signs of peritonitis  Musculoskeletal:     Cervical back: Neck supple.     Right lower leg: No edema.     Left lower leg: No edema.  Skin:    General: Skin is warm and dry.  Neurological:     Mental Status: He is alert and oriented to person, place, and time.  Psychiatric:        Mood and Affect: Mood normal.     ED Results / Procedures / Treatments   Labs (all labs ordered are listed, but only abnormal results are  displayed) Labs Reviewed  CBC WITH DIFFERENTIAL/PLATELET - Abnormal; Notable for the following components:      Result Value   WBC 13.8 (*)    RBC 6.10 (*)    Hemoglobin 17.7 (*)    HCT 54.5 (*)    Neutro Abs 12.2 (*)    Abs Immature Granulocytes 0.08 (*)    All other components within normal limits  URINALYSIS, ROUTINE W REFLEX MICROSCOPIC - Abnormal; Notable for the following components:   Specific Gravity, Urine >1.030 (*)    Glucose, UA >=500 (*)    Hgb urine dipstick LARGE (*)    Ketones, ur 40 (*)    Protein, ur >300 (*)    All other components within normal limits  COMPREHENSIVE METABOLIC PANEL - Abnormal; Notable for the following components:   CO2 20 (*)    Glucose, Bld 308 (*)    BUN 25 (*)    ALT 53 (*)    Total Bilirubin 2.1 (*)    GFR calc non Af Amer 59 (*)    Anion gap 17 (*)    All other components within normal limits  URINALYSIS, MICROSCOPIC (REFLEX) - Abnormal; Notable for the following components:   Bacteria, UA FEW (*)    Non Squamous Epithelial PRESENT (*)    All other components within normal limits  BASIC METABOLIC PANEL - Abnormal; Notable for the following components:   CO2 21 (*)    Glucose, Bld 288 (*)    BUN 26 (*)    GFR calc non Af Amer 59 (*)    All other components within normal limits  GLUCOSE, CAPILLARY - Abnormal; Notable for the following components:   Glucose-Capillary 141 (*)    All other components within normal limits  HEMOGLOBIN A1C - Abnormal; Notable for the following components:   Hgb A1c MFr Bld 9.1 (*)    All other components within normal limits  BASIC METABOLIC PANEL - Abnormal; Notable for the following components:   Glucose, Bld 179 (*)    BUN 34 (*)    Creatinine, Ser 1.53 (*)    Calcium 8.2 (*)    GFR calc non Af Amer 42 (*)    GFR calc Af Amer 49 (*)    All other components within normal limits  HEPATIC FUNCTION PANEL - Abnormal; Notable for the following components:  Total Protein 6.4 (*)    Bilirubin, Direct  0.3 (*)    All other components within normal limits  GLUCOSE, CAPILLARY - Abnormal; Notable for the following components:   Glucose-Capillary 349 (*)    All other components within normal limits  GLUCOSE, CAPILLARY - Abnormal; Notable for the following components:   Glucose-Capillary 171 (*)    All other components within normal limits  GLUCOSE, CAPILLARY - Abnormal; Notable for the following components:   Glucose-Capillary 244 (*)    All other components within normal limits  GLUCOSE, CAPILLARY - Abnormal; Notable for the following components:   Glucose-Capillary 234 (*)    All other components within normal limits  BASIC METABOLIC PANEL - Abnormal; Notable for the following components:   Glucose, Bld 245 (*)    BUN 34 (*)    Creatinine, Ser 1.36 (*)    Calcium 8.5 (*)    GFR calc non Af Amer 48 (*)    GFR calc Af Amer 56 (*)    All other components within normal limits  GLUCOSE, CAPILLARY - Abnormal; Notable for the following components:   Glucose-Capillary 216 (*)    All other components within normal limits  CBG MONITORING, ED - Abnormal; Notable for the following components:   Glucose-Capillary 147 (*)    All other components within normal limits  CBG MONITORING, ED - Abnormal; Notable for the following components:   Glucose-Capillary 224 (*)    All other components within normal limits  TROPONIN I (HIGH SENSITIVITY) - Abnormal; Notable for the following components:   Troponin I (High Sensitivity) 31 (*)    All other components within normal limits  TROPONIN I (HIGH SENSITIVITY) - Abnormal; Notable for the following components:   Troponin I (High Sensitivity) 44 (*)    All other components within normal limits  TROPONIN I (HIGH SENSITIVITY) - Abnormal; Notable for the following components:   Troponin I (High Sensitivity) 55 (*)    All other components within normal limits  TROPONIN I (HIGH SENSITIVITY) - Abnormal; Notable for the following components:   Troponin I (High  Sensitivity) 40 (*)    All other components within normal limits  TROPONIN I (HIGH SENSITIVITY) - Abnormal; Notable for the following components:   Troponin I (High Sensitivity) 32 (*)    All other components within normal limits  SARS CORONAVIRUS 2 BY RT PCR (HOSPITAL ORDER, [facility])  LIPASE, BLOOD  TROPONIN I (HIGH SENSITIVITY)    EKG EKG Interpretation  Date/Time:  Monday April 26 2019 10:03:16 EST Ventricular Rate:  88 PR Interval:    QRS Duration: 94 QT Interval:  379 QTC Calculation: 459 R Axis:   37 Text Interpretation: Sinus rhythm Sinus pause Abnormal R-wave progression, early transition Nonspecific T abnormalities, lateral leads Confirmed by Nanda Quinton [phone]) on 04/26/2019 10:42:54 AM   Radiology No results found.  Procedures Procedures (including critical care time)  CRITICAL CARE Performed by: Merryl Hacker   Total critical care time: 45 minutes  Critical care time was exclusive of separately billable procedures and treating other patients.  Critical care was necessary to treat or prevent imminent or life-threatening deterioration.  Critical care was time spent personally by me on the following activities: development of treatment plan with patient and/or surrogate as well as nursing, discussions with consultants, evaluation of patient's response to treatment, examination of patient, obtaining history from patient or surrogate, ordering and performing treatments and interventions, ordering and review of laboratory studies, ordering  and review of radiographic studies, pulse oximetry and re-evaluation of patient's condition.   Medications Ordered in ED Medications  LORazepam (ATIVAN) injection 0.5 mg (0.5 mg Intravenous Given 04/25/19 2347)  ondansetron (ZOFRAN) injection 4 mg (4 mg Intravenous Given 04/25/19 2347)  sodium chloride 0.9 % bolus 1,000 mL (0 mLs Intravenous Stopped 04/26/19 0225)  gabapentin (NEURONTIN) capsule  300 mg (300 mg Oral Given 04/26/19 0133)  aspirin EC tablet 325 mg (325 mg Oral Given 04/26/19 0254)  LORazepam (ATIVAN) injection 0.5 mg (0.5 mg Intravenous Given 04/26/19 0347)  0.9 %  sodium chloride infusion ( Intravenous New Bag/Given 04/26/19 0345)  diltiazem (CARDIZEM CD) 24 hr capsule 120 mg (120 mg Oral Given 04/26/19 0346)  docusate sodium (COLACE) capsule 100 mg (100 mg Oral Given 04/26/19 1405)  insulin aspart (novoLOG) injection 4 Units (4 Units Subcutaneous Given 04/26/19 2156)    ED Course  I have reviewed the triage vital signs and the nursing notes.  Pertinent labs & imaging results that were available during my care of the patient were reviewed by me and considered in my medical decision making (see chart for details).  Clinical Course as of Apr 29 1122  Mon Apr 26, 2019  0328 Spoke with the patient's stepdaughter.  She reports that he has been without his medications for over the last 2 days.  Her mother usually tends to his medications but she has been having medical problems as well.  She states that at baseline he is very anxious especially without his wife.  She can confirm diltiazem dosing.  He is on Lantus and NovoLog but with unknown amounts.   [CH]    Clinical Course User Index [CH] Rosell Khouri, Barbette Hair, MD   MDM Rules/Calculators/A&P                       Patient presents with nausea.  He is very anxious on exam.  He denies any other symptoms besides nausea.  He is afebrile.  Low risk Covid regarding exposures.  Vital signs notable for heart rate of 116 and blood pressure of 200/104.  Patient was given fluids and Ativan.  Lab work was obtained.  EKG shows no evidence of acute ischemia.  Work is evident of dehydration with hemoconcentration with leukocytosis and elevated hemoglobin.  Again he was given fluids.  Additionally he has a slight anion gap at 17 with elevated glucose to 308.  He is not a type I diabetic.  Suspect this may be related to dehydration versus DKA.  No  history of DKA.  Initial troponin is negative.  Repeat troponin elevated to 31.  This may be related to hypertensive urgency and or emergency.  After speaking with his stepdaughter, concern for patient not receiving his medications over the last 2 days.  Patient was given a dose of his carvedilol and was placed on sliding scale insulin.  Repeat BMP shows improving gap after just fluids.  Will not start glucose stabilizer.  Patient continues to be without chest pain or discomfort.  Regarding his vomiting and abdominal tenderness, will obtain CT to r/o obstructive process.  I asked his stepdaughter whether he takes Xanax daily and whether he could be withdrawing.  She is unsure.  He was given another dose of Ativan.  Given evidence of elevated troponin, dehydration, hyperglycemia, will admit for observation.  Suspect he may have some element of hypertensive urgency and less likely his nausea be in an anginal equivalent.  He has not  had any emesis while in the emergency department.   Final Clinical Impression(s) / ED Diagnoses Final diagnoses:  Dehydration  Elevated troponin  Hypertensive urgency  Nausea and vomiting, intractability of vomiting not specified, unspecified vomiting type    Rx / DC Orders ED Discharge Orders         Ordered    Increase activity slowly     04/27/19 1329    Increase activity slowly     04/27/19 1329    Diet - low sodium heart healthy     04/27/19 1329    Diet Carb Modified     04/27/19 1329    NEUPRO 8 MG/24HR PT24  Daily     04/27/19 1329    gabapentin (NEURONTIN) 600 MG tablet  3 times daily     04/27/19 1329    aspirin EC 81 MG tablet  Daily     04/27/19 1329           Miette Molenda, Barbette Hair, MD 04/26/19 [id number]    Merryl Hacker, MD 04/30/19 1124    Dina Rich, Barbette Hair, MD 04/30/19 1126

## 2019-04-25 NOTE — ED Notes (Signed)
Per daughter Sonia Baller - pt hasn't had his medications since Friday night or Saturday morning. His wife usually manages them but she is in the hospital. She reports that he last had insulin yesterday, states today he had some gabapentin and zofran but threw that up. Also had diltiazem today at 2000.

## 2019-04-26 ENCOUNTER — Encounter (HOSPITAL_BASED_OUTPATIENT_CLINIC_OR_DEPARTMENT_OTHER): Payer: Self-pay | Admitting: Internal Medicine

## 2019-04-26 ENCOUNTER — Emergency Department (HOSPITAL_BASED_OUTPATIENT_CLINIC_OR_DEPARTMENT_OTHER): Payer: Medicare Other

## 2019-04-26 DIAGNOSIS — Z880 Allergy status to penicillin: Secondary | ICD-10-CM | POA: Diagnosis not present

## 2019-04-26 DIAGNOSIS — Z833 Family history of diabetes mellitus: Secondary | ICD-10-CM | POA: Diagnosis not present

## 2019-04-26 DIAGNOSIS — F419 Anxiety disorder, unspecified: Secondary | ICD-10-CM | POA: Diagnosis not present

## 2019-04-26 DIAGNOSIS — K760 Fatty (change of) liver, not elsewhere classified: Secondary | ICD-10-CM | POA: Diagnosis not present

## 2019-04-26 DIAGNOSIS — Z20822 Contact with and (suspected) exposure to covid-19: Secondary | ICD-10-CM | POA: Diagnosis not present

## 2019-04-26 DIAGNOSIS — E860 Dehydration: Secondary | ICD-10-CM | POA: Diagnosis not present

## 2019-04-26 DIAGNOSIS — I160 Hypertensive urgency: Secondary | ICD-10-CM | POA: Diagnosis not present

## 2019-04-26 DIAGNOSIS — R7989 Other specified abnormal findings of blood chemistry: Secondary | ICD-10-CM

## 2019-04-26 DIAGNOSIS — E785 Hyperlipidemia, unspecified: Secondary | ICD-10-CM | POA: Diagnosis not present

## 2019-04-26 DIAGNOSIS — G4733 Obstructive sleep apnea (adult) (pediatric): Secondary | ICD-10-CM | POA: Diagnosis not present

## 2019-04-26 DIAGNOSIS — Z881 Allergy status to other antibiotic agents status: Secondary | ICD-10-CM | POA: Diagnosis not present

## 2019-04-26 DIAGNOSIS — Z87891 Personal history of nicotine dependence: Secondary | ICD-10-CM | POA: Diagnosis not present

## 2019-04-26 DIAGNOSIS — I119 Hypertensive heart disease without heart failure: Secondary | ICD-10-CM | POA: Diagnosis present

## 2019-04-26 DIAGNOSIS — G20 Parkinson's disease: Secondary | ICD-10-CM | POA: Diagnosis not present

## 2019-04-26 DIAGNOSIS — K219 Gastro-esophageal reflux disease without esophagitis: Secondary | ICD-10-CM | POA: Diagnosis not present

## 2019-04-26 DIAGNOSIS — R945 Abnormal results of liver function studies: Secondary | ICD-10-CM | POA: Diagnosis not present

## 2019-04-26 DIAGNOSIS — I2510 Atherosclerotic heart disease of native coronary artery without angina pectoris: Secondary | ICD-10-CM | POA: Diagnosis not present

## 2019-04-26 DIAGNOSIS — K5730 Diverticulosis of large intestine without perforation or abscess without bleeding: Secondary | ICD-10-CM | POA: Diagnosis not present

## 2019-04-26 DIAGNOSIS — E1142 Type 2 diabetes mellitus with diabetic polyneuropathy: Secondary | ICD-10-CM | POA: Diagnosis not present

## 2019-04-26 DIAGNOSIS — Z79899 Other long term (current) drug therapy: Secondary | ICD-10-CM | POA: Diagnosis not present

## 2019-04-26 DIAGNOSIS — R778 Other specified abnormalities of plasma proteins: Secondary | ICD-10-CM | POA: Diagnosis not present

## 2019-04-26 DIAGNOSIS — E1165 Type 2 diabetes mellitus with hyperglycemia: Secondary | ICD-10-CM | POA: Diagnosis not present

## 2019-04-26 DIAGNOSIS — G2581 Restless legs syndrome: Secondary | ICD-10-CM | POA: Diagnosis not present

## 2019-04-26 DIAGNOSIS — Z7982 Long term (current) use of aspirin: Secondary | ICD-10-CM | POA: Diagnosis not present

## 2019-04-26 DIAGNOSIS — J9811 Atelectasis: Secondary | ICD-10-CM | POA: Diagnosis not present

## 2019-04-26 DIAGNOSIS — Z794 Long term (current) use of insulin: Secondary | ICD-10-CM | POA: Diagnosis not present

## 2019-04-26 DIAGNOSIS — Z885 Allergy status to narcotic agent status: Secondary | ICD-10-CM | POA: Diagnosis not present

## 2019-04-26 DIAGNOSIS — M1990 Unspecified osteoarthritis, unspecified site: Secondary | ICD-10-CM | POA: Diagnosis not present

## 2019-04-26 DIAGNOSIS — R112 Nausea with vomiting, unspecified: Secondary | ICD-10-CM | POA: Diagnosis not present

## 2019-04-26 LAB — BASIC METABOLIC PANEL
Anion gap: 15 (ref 5–15)
BUN: 26 mg/dL — ABNORMAL HIGH (ref 8–23)
CO2: 21 mmol/L — ABNORMAL LOW (ref 22–32)
Calcium: 9.2 mg/dL (ref 8.9–10.3)
Chloride: 103 mmol/L (ref 98–111)
Creatinine, Ser: 1.16 mg/dL (ref 0.61–1.24)
GFR calc Af Amer: >60 mL/min (ref >60)
GFR calc non Af Amer: 59 mL/min — ABNORMAL LOW (ref >60)
Glucose, Bld: 288 mg/dL — ABNORMAL HIGH (ref 70–99)
Potassium: 3.8 mmol/L (ref 3.5–5.1)
Sodium: 139 mmol/L (ref 135–145)

## 2019-04-26 LAB — COMPREHENSIVE METABOLIC PANEL
ALT: 53 U/L — ABNORMAL HIGH (ref 0–44)
AST: 33 U/L (ref 15–41)
Albumin: 4.3 g/dL (ref 3.5–5.0)
Alkaline Phosphatase: 52 U/L (ref 38–126)
Anion gap: 17 — ABNORMAL HIGH (ref 5–15)
BUN: 25 mg/dL — ABNORMAL HIGH (ref 8–23)
CO2: 20 mmol/L — ABNORMAL LOW (ref 22–32)
Calcium: 9.8 mg/dL (ref 8.9–10.3)
Chloride: 100 mmol/L (ref 98–111)
Creatinine, Ser: 1.15 mg/dL (ref 0.61–1.24)
GFR calc Af Amer: >60 mL/min (ref >60)
GFR calc non Af Amer: 59 mL/min — ABNORMAL LOW (ref >60)
Glucose, Bld: 308 mg/dL — ABNORMAL HIGH (ref 70–99)
Potassium: 4 mmol/L (ref 3.5–5.1)
Sodium: 137 mmol/L (ref 135–145)
Total Bilirubin: 2.1 mg/dL — ABNORMAL HIGH (ref 0.3–1.2)
Total Protein: 7.9 g/dL (ref 6.5–8.1)

## 2019-04-26 LAB — CBC WITH DIFFERENTIAL/PLATELET
Abs Immature Granulocytes: 0.08 10*3/uL — ABNORMAL HIGH (ref 0.00–0.07)
Basophils Absolute: 0 10*3/uL (ref 0.0–0.1)
Basophils Relative: 0 %
Eosinophils Absolute: 0 10*3/uL (ref 0.0–0.5)
Eosinophils Relative: 0 %
HCT: 54.5 % — ABNORMAL HIGH (ref 39.0–52.0)
Hemoglobin: 17.7 g/dL — ABNORMAL HIGH (ref 13.0–17.0)
Immature Granulocytes: 1 %
Lymphocytes Relative: 7 %
Lymphs Abs: 1 10*3/uL (ref 0.7–4.0)
MCH: 29 pg (ref 26.0–34.0)
MCHC: 32.5 g/dL (ref 30.0–36.0)
MCV: 89.3 fL (ref 80.0–100.0)
Monocytes Absolute: 0.6 10*3/uL (ref 0.1–1.0)
Monocytes Relative: 4 %
Neutro Abs: 12.2 10*3/uL — ABNORMAL HIGH (ref 1.7–7.7)
Neutrophils Relative %: 88 %
Platelets: 152 10*3/uL (ref 150–400)
RBC: 6.1 MIL/uL — ABNORMAL HIGH (ref 4.22–5.81)
RDW: 14.4 % (ref 11.5–15.5)
WBC: 13.8 10*3/uL — ABNORMAL HIGH (ref 4.0–10.5)
nRBC: 0 % (ref 0.0–0.2)

## 2019-04-26 LAB — URINALYSIS, ROUTINE W REFLEX MICROSCOPIC
Bilirubin Urine: NEGATIVE
Glucose, UA: >=500 mg/dL — AB
Ketones, ur: 40 mg/dL — AB
Leukocytes,Ua: NEGATIVE
Nitrite: NEGATIVE
Protein, ur: >300 mg/dL — AB
Specific Gravity, Urine: >1.03 — ABNORMAL HIGH (ref 1.005–1.030)
pH: 5.5 (ref 5.0–8.0)

## 2019-04-26 LAB — URINALYSIS, MICROSCOPIC (REFLEX)

## 2019-04-26 LAB — GLUCOSE, CAPILLARY
Glucose-Capillary: 141 mg/dL — ABNORMAL HIGH (ref 70–99)
Glucose-Capillary: 349 mg/dL — ABNORMAL HIGH (ref 70–99)

## 2019-04-26 LAB — TROPONIN I (HIGH SENSITIVITY)
Troponin I (High Sensitivity): 16 ng/L (ref <18)
Troponin I (High Sensitivity): 31 ng/L — ABNORMAL HIGH (ref <18)
Troponin I (High Sensitivity): 40 ng/L — ABNORMAL HIGH (ref <18)
Troponin I (High Sensitivity): 44 ng/L — ABNORMAL HIGH (ref <18)
Troponin I (High Sensitivity): 55 ng/L — ABNORMAL HIGH (ref <18)

## 2019-04-26 LAB — SARS CORONAVIRUS 2 BY RT PCR (HOSPITAL ORDER, PERFORMED IN ~~LOC~~ HOSPITAL LAB): SARS Coronavirus 2: NEGATIVE

## 2019-04-26 LAB — HEMOGLOBIN A1C
Hgb A1c MFr Bld: 9.1 % — ABNORMAL HIGH (ref 4.8–5.6)
Mean Plasma Glucose: 214.47 mg/dL

## 2019-04-26 LAB — CBG MONITORING, ED
Glucose-Capillary: 147 mg/dL — ABNORMAL HIGH (ref 70–99)
Glucose-Capillary: 224 mg/dL — ABNORMAL HIGH (ref 70–99)

## 2019-04-26 LAB — LIPASE, BLOOD: Lipase: 23 U/L (ref 11–51)

## 2019-04-26 IMAGING — DX DG CHEST 1V PORT
1 series · 1 of 1 positions shown · non-contrast
Comparison: Lung bases from abdominal CT earlier this day.

CLINICAL DATA: Elevated troponin. Nausea and vomiting.

EXAM:
PORTABLE CHEST 1 VIEW

[chest ap]
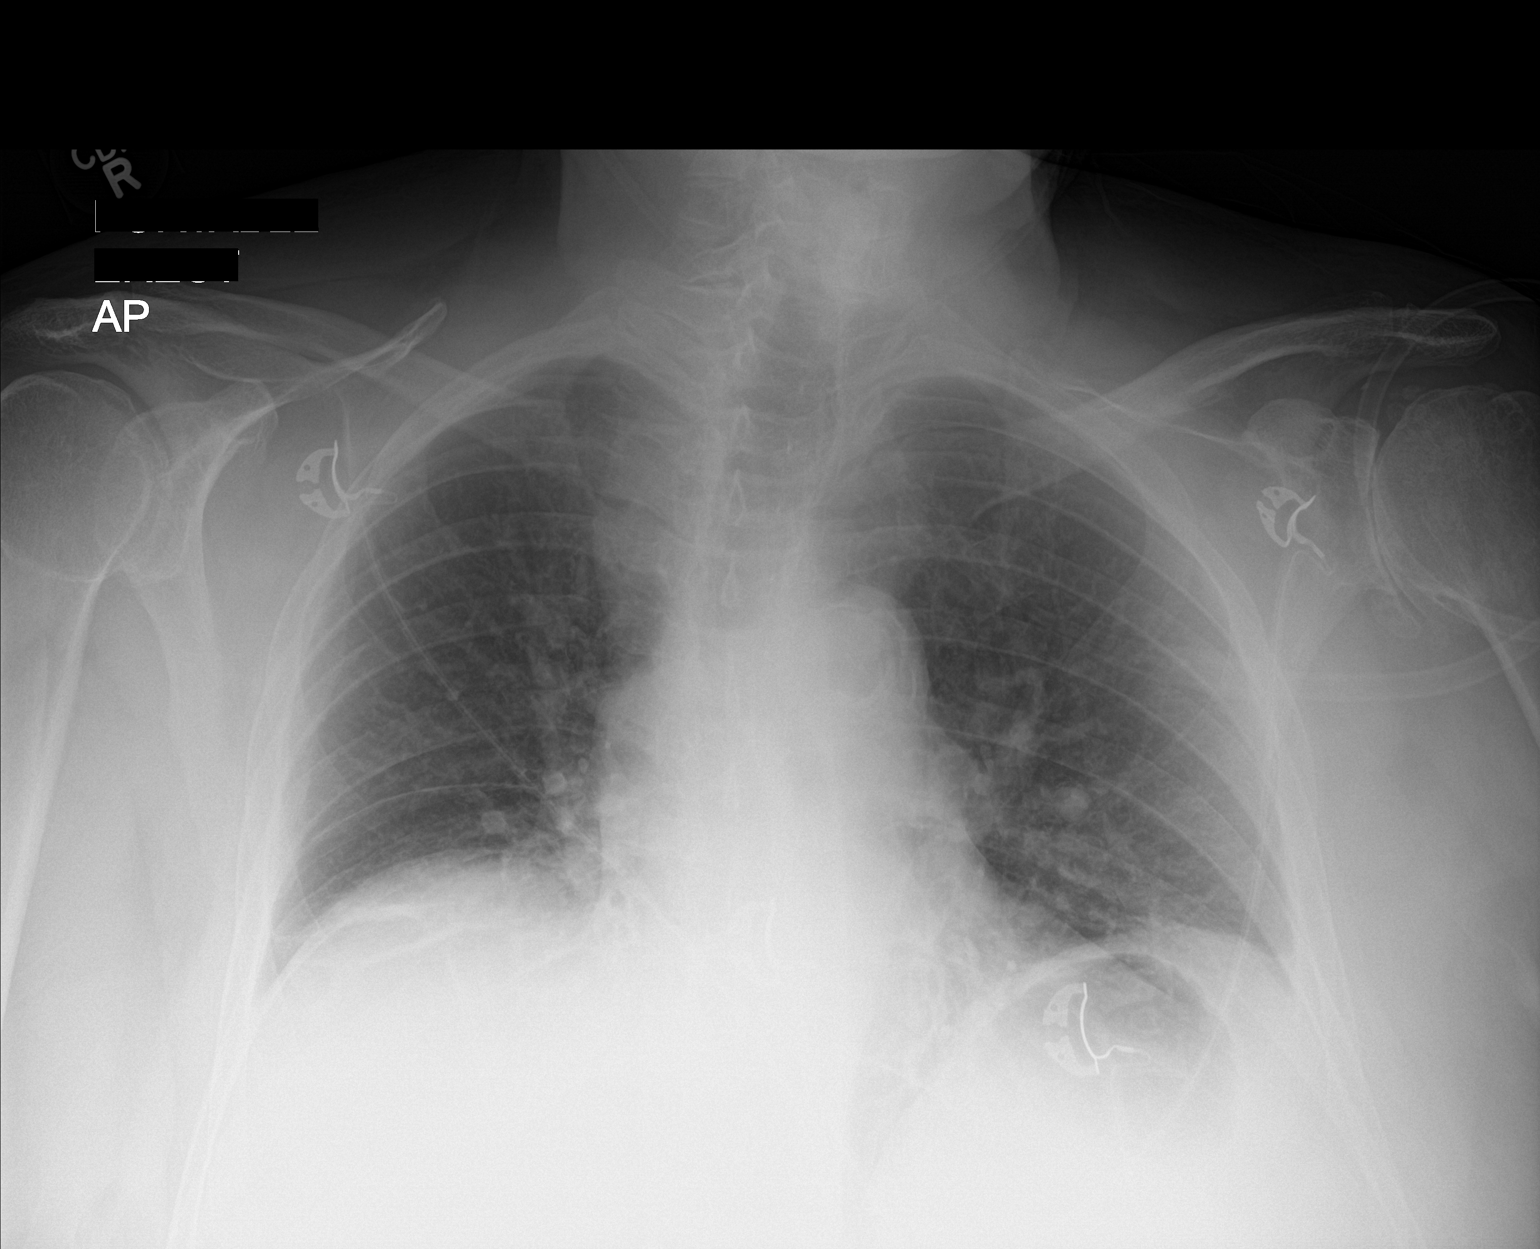

[1 of 1 positions shown; findings below may reference images not displayed]

FINDINGS: Low lung volumes. Mild streaky bibasilar atelectasis. Heart appears
normal in size, not well assessed due to positioning and low lung
volumes. Aortic atherosclerosis. No pulmonary edema, large pleural
effusion or pneumothorax. Degenerative change in the left shoulder.
IMPRESSION: Hypoventilatory chest with bibasilar atelectasis.

Aortic Atherosclerosis ([E7]-[E7]).

## 2019-04-26 IMAGING — CT CT ABD-PELV W/O CM
2 of 4 series · 16 of 46 positions shown, 18 images · non-contrast
Comparison: [DATE]

CLINICAL DATA: Nausea and vomiting.

EXAM:
CT ABDOMEN AND PELVIS WITHOUT CONTRAST
TECHNIQUE: Multidetector CT imaging of the abdomen and pelvis was performed
following the standard protocol without IV contrast.

[Series 7: axial st · axial · 0.94mm/px · z∈[-549,-74]mm · 13 of 105 slices shown, 15 images]
[im 5/105  soft-tissue]
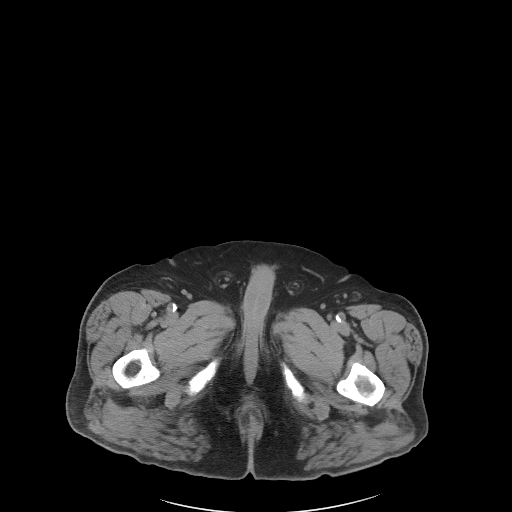
[im 5/105  bone]
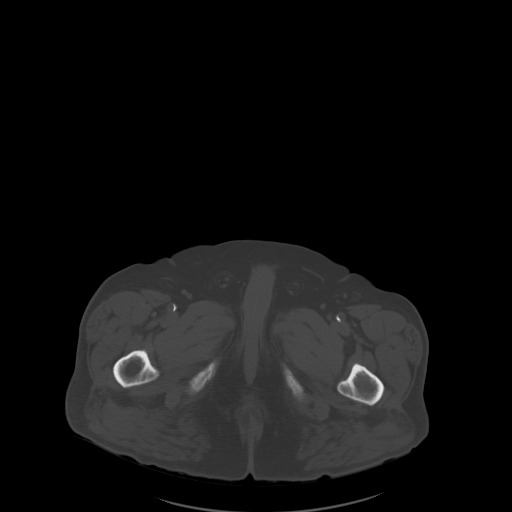
[im 15/105  soft-tissue]
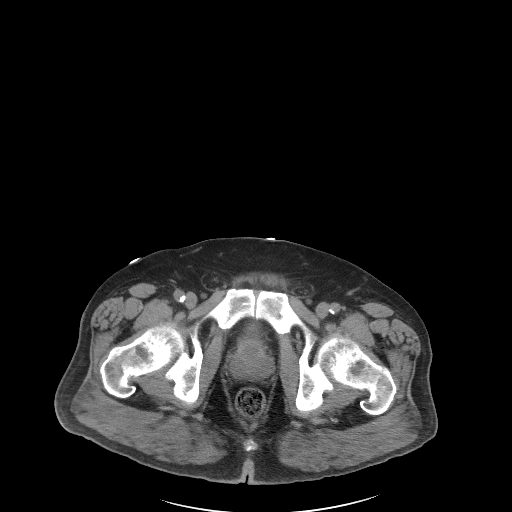
[im 24/105  soft-tissue]
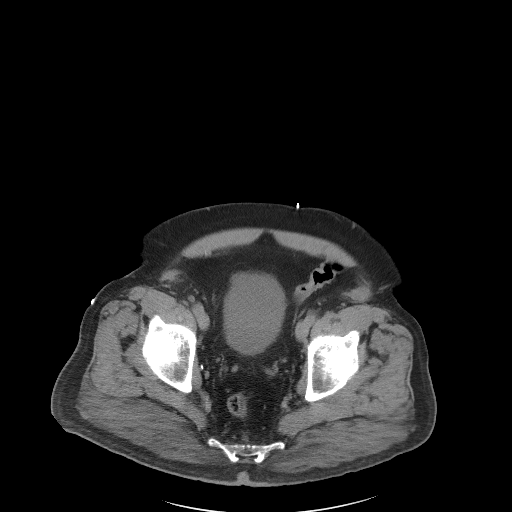
[im 29/105  soft-tissue]
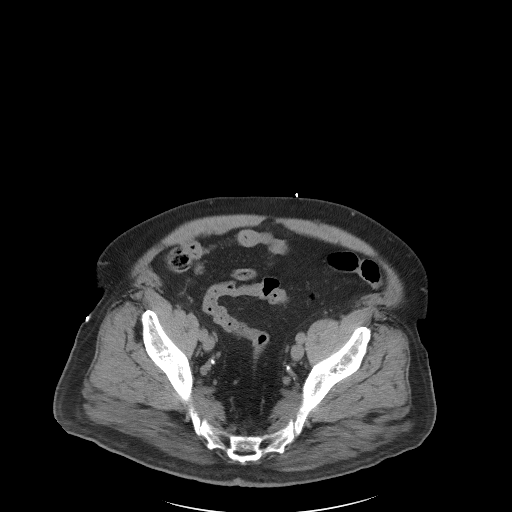
[im 38/105  soft-tissue]
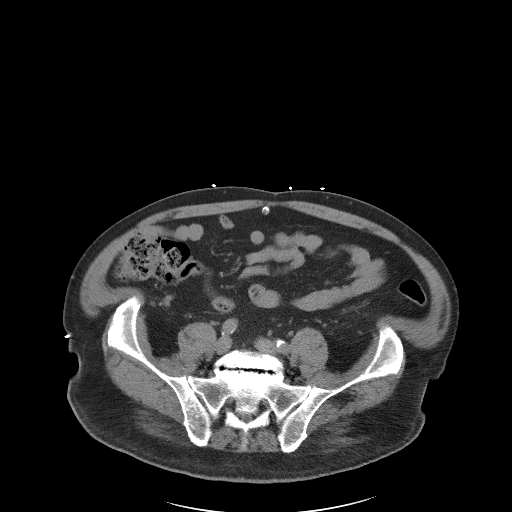
[im 43/105  soft-tissue]
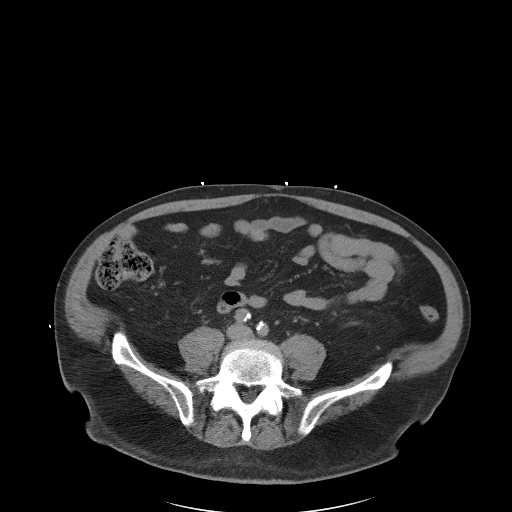
[im 53/105  soft-tissue]
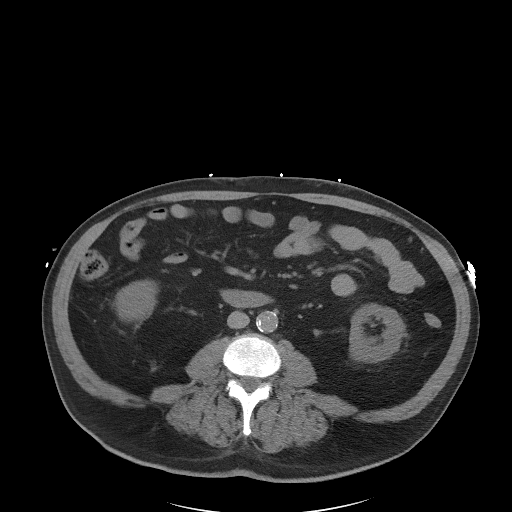
[im 62/105  soft-tissue]
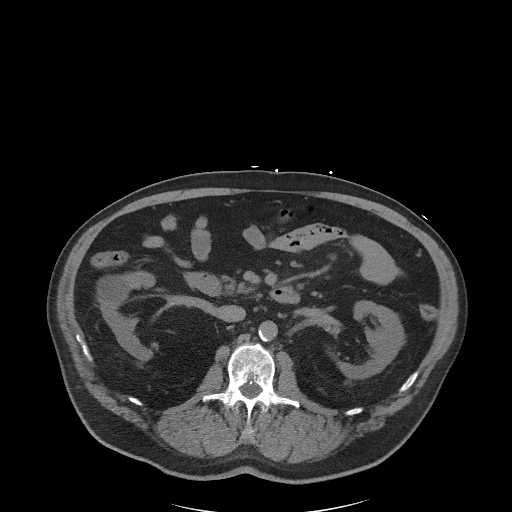
[im 67/105  soft-tissue]
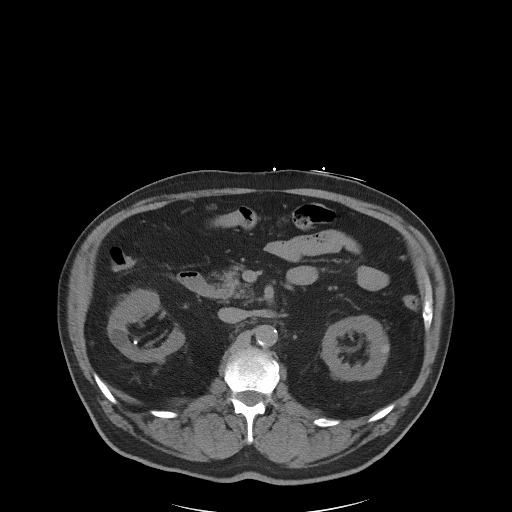
[im 67/105  bone]
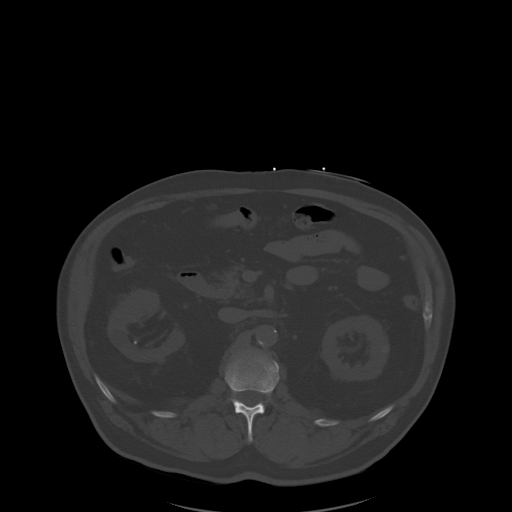
[im 76/105  soft-tissue]
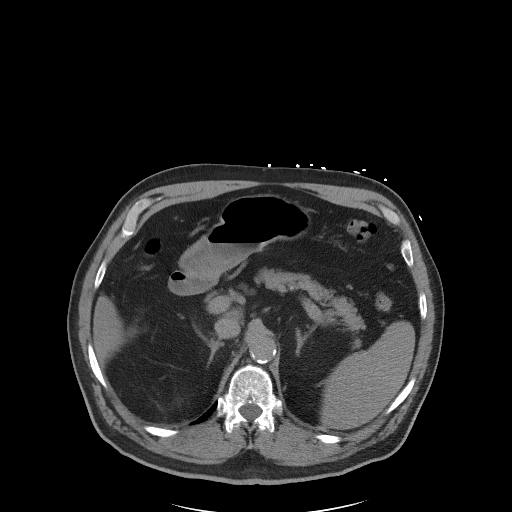
[im 81/105  soft-tissue]
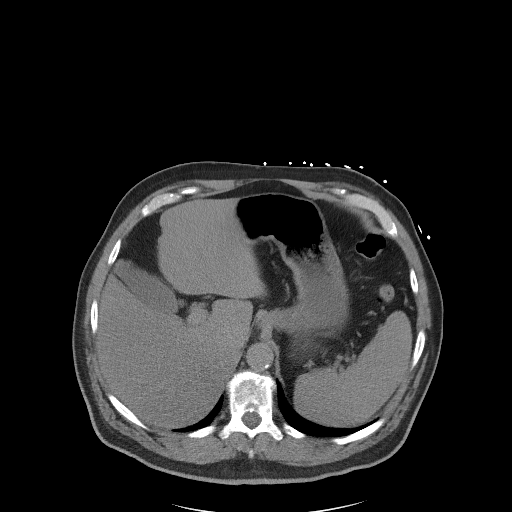
[im 90/105  soft-tissue]
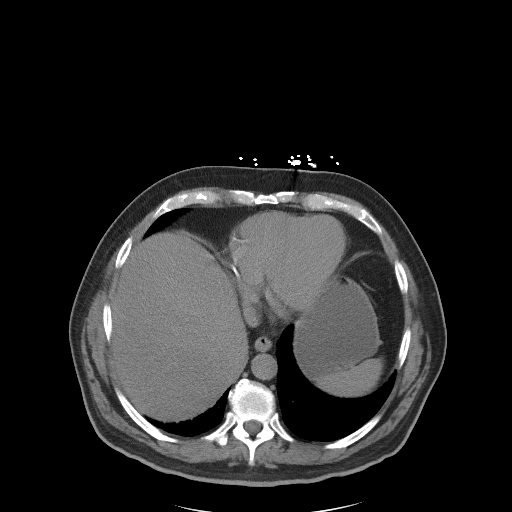
[im 100/105  soft-tissue]
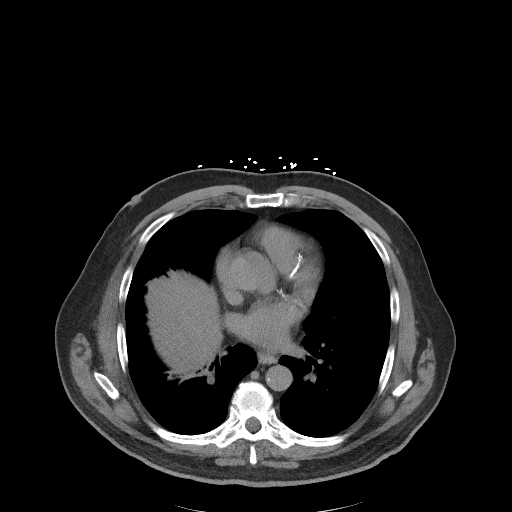

[Series 8: coronal st · coronal · 0.82mm/px · 3 of 90 slices shown]
[im 30/90  soft-tissue]
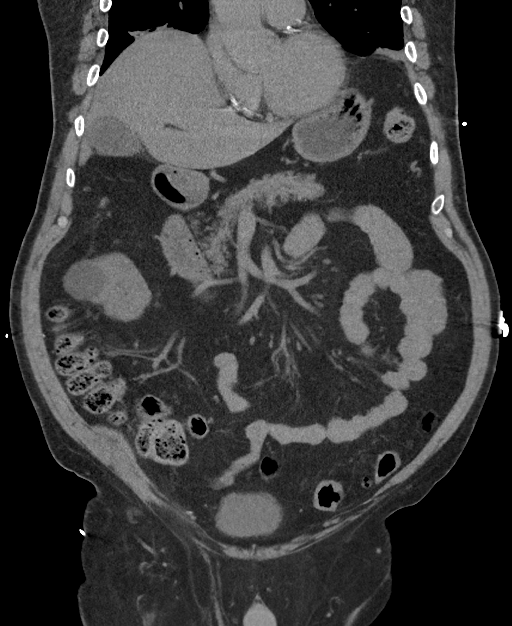
[im 40/90  soft-tissue]
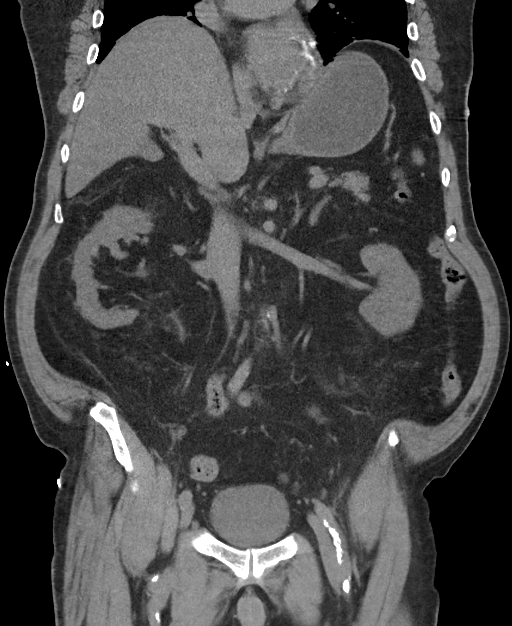
[im 50/90  soft-tissue]
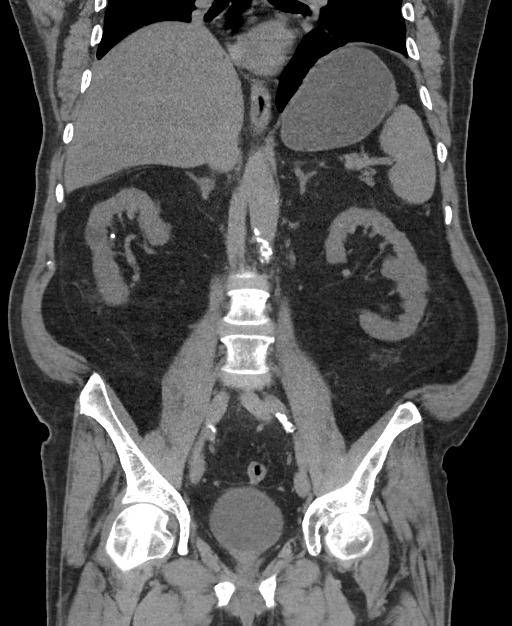

[16 of 46 positions shown; findings below may reference images not displayed]

FINDINGS: Lower chest: There is atelectasis versus scarring at the lung bases
the ascending thoracic aorta appears to be at least 4.1 cm in
diameter and therefore is aneurysmal. Aortic calcifications are
noted. Coronary artery calcifications are noted.

Hepatobiliary: There is decreased hepatic attenuation suggestive of
hepatic steatosis. Normal gallbladder.There is no biliary ductal
dilation.

Pancreas: Normal contours without ductal dilatation. No
peripancreatic fluid collection.

Spleen: No splenic laceration or hematoma.

Adrenals/Urinary Tract:

--Adrenal glands: No adrenal hemorrhage.

--Right kidney/ureter: Again noted is a hyperdense exophytic
structure arising from the interpolar region of the right kidney
measuring approximately 1.2 cm. Multiple nonobstructing stones are
noted. Multiple cysts are noted.

--Left kidney/ureter: There is an exophytic hyperdense 1.6 cm nodule
rising from the interpolar region of the left kidney. There is no
hydronephrosis. There are no radiopaque obstructing kidney stones.

--Urinary bladder: Unremarkable.

Stomach/Bowel:

--Stomach/Duodenum: No hiatal hernia or other gastric abnormality.
Normal duodenal course and caliber.

--Small bowel: No dilatation or inflammation.

--Colon: Rectosigmoid diverticulosis without acute inflammation.

--Appendix: Normal.

Vascular/Lymphatic: Atherosclerotic calcification is present within
the non-aneurysmal abdominal aorta, without hemodynamically
significant stenosis.

--No retroperitoneal lymphadenopathy.

--there are mildly enlarged mesenteric lymph nodes, stable from
prior study.

--No pelvic or inguinal lymphadenopathy.

Reproductive: Unremarkable

Other: No ascites or free air. There are bilateral fat containing
inguinal hernias.

Musculoskeletal. No acute displaced fractures.
IMPRESSION: 1. Rectosigmoid diverticulosis without acute diverticulitis.
2. Stable ascending thoracic aortic aneurysm.
3. Hepatic steatosis.
4. Bilateral fat containing inguinal hernias.

Aortic Atherosclerosis ([KC]-[KC]).

## 2019-04-26 MED ORDER — ASPIRIN EC 325 MG PO TBEC
325.0000 mg | DELAYED_RELEASE_TABLET | Freq: Once | ORAL | Status: AC
  Administered 2019-04-26: 325 mg via ORAL
  Filled 2019-04-26: qty 1

## 2019-04-26 MED ORDER — INSULIN ASPART 100 UNIT/ML ~~LOC~~ SOLN
0.0000 [IU] | SUBCUTANEOUS | Status: DC
  Administered 2019-04-26: 5 [IU] via SUBCUTANEOUS
  Administered 2019-04-26: 8 [IU] via SUBCUTANEOUS
  Administered 2019-04-26: 2 [IU] via SUBCUTANEOUS
  Filled 2019-04-26 (×3): qty 1

## 2019-04-26 MED ORDER — ALLOPURINOL 100 MG PO TABS
200.0000 mg | ORAL_TABLET | Freq: Every day | ORAL | Status: DC
  Administered 2019-04-26 – 2019-04-27 (×2): 200 mg via ORAL
  Filled 2019-04-26 (×2): qty 2

## 2019-04-26 MED ORDER — MELOXICAM 7.5 MG PO TABS
15.0000 mg | ORAL_TABLET | Freq: Every day | ORAL | Status: DC
  Administered 2019-04-26 – 2019-04-27 (×2): 15 mg via ORAL
  Filled 2019-04-26 (×2): qty 2

## 2019-04-26 MED ORDER — AMLODIPINE BESYLATE 5 MG PO TABS
5.0000 mg | ORAL_TABLET | Freq: Every day | ORAL | Status: DC
  Administered 2019-04-26 – 2019-04-27 (×2): 5 mg via ORAL
  Filled 2019-04-26 (×2): qty 1

## 2019-04-26 MED ORDER — TAMSULOSIN HCL 0.4 MG PO CAPS
0.4000 mg | ORAL_CAPSULE | Freq: Every day | ORAL | Status: DC
  Administered 2019-04-26: 0.4 mg via ORAL
  Filled 2019-04-26: qty 1

## 2019-04-26 MED ORDER — SODIUM CHLORIDE 0.9 % IV BOLUS
1000.0000 mL | Freq: Once | INTRAVENOUS | Status: AC
  Administered 2019-04-26: 1000 mL via INTRAVENOUS

## 2019-04-26 MED ORDER — L-METHYLFOLATE-B6-B12 3-35-2 MG PO TABS
1.0000 | ORAL_TABLET | Freq: Two times a day (BID) | ORAL | Status: DC
  Administered 2019-04-26 – 2019-04-27 (×2): 1 via ORAL
  Filled 2019-04-26 (×3): qty 1

## 2019-04-26 MED ORDER — COLCHICINE 0.6 MG PO TABS
0.6000 mg | ORAL_TABLET | Freq: Every day | ORAL | Status: DC
  Administered 2019-04-26 – 2019-04-27 (×2): 0.6 mg via ORAL
  Filled 2019-04-26 (×2): qty 1

## 2019-04-26 MED ORDER — HYDROCODONE-ACETAMINOPHEN 5-325 MG PO TABS
1.0000 | ORAL_TABLET | Freq: Four times a day (QID) | ORAL | Status: DC | PRN
  Administered 2019-04-26: 1 via ORAL
  Filled 2019-04-26: qty 1

## 2019-04-26 MED ORDER — HYDROCODONE-ACETAMINOPHEN 5-325 MG PO TABS: 1.0000 | ORAL_TABLET | Freq: Every day | ORAL | Status: DC

## 2019-04-26 MED ORDER — INSULIN ASPART 100 UNIT/ML ~~LOC~~ SOLN
0.0000 [IU] | Freq: Three times a day (TID) | SUBCUTANEOUS | Status: DC
  Administered 2019-04-27 (×2): 5 [IU] via SUBCUTANEOUS

## 2019-04-26 MED ORDER — ASPIRIN EC 325 MG PO TBEC
325.0000 mg | DELAYED_RELEASE_TABLET | Freq: Every day | ORAL | Status: DC
  Administered 2019-04-26 – 2019-04-27 (×2): 325 mg via ORAL
  Filled 2019-04-26 (×2): qty 1

## 2019-04-26 MED ORDER — CLONAZEPAM 0.5 MG PO TABS
0.5000 mg | ORAL_TABLET | Freq: Two times a day (BID) | ORAL | Status: DC
  Administered 2019-04-26 – 2019-04-27 (×2): 0.5 mg via ORAL
  Filled 2019-04-26 (×2): qty 1

## 2019-04-26 MED ORDER — HEPARIN SODIUM (PORCINE) 5000 UNIT/ML IJ SOLN
5000.0000 [IU] | Freq: Three times a day (TID) | INTRAMUSCULAR | Status: DC
  Administered 2019-04-26 – 2019-04-27 (×2): 5000 [IU] via SUBCUTANEOUS
  Filled 2019-04-26 (×2): qty 1

## 2019-04-26 MED ORDER — ROTIGOTINE 6 MG/24HR TD PT24: 1.0000 | MEDICATED_PATCH | Freq: Every day | TRANSDERMAL | Status: DC

## 2019-04-26 MED ORDER — FERROUS SULFATE 325 (65 FE) MG PO TABS
325.0000 mg | ORAL_TABLET | Freq: Every day | ORAL | Status: DC
  Administered 2019-04-27: 325 mg via ORAL
  Filled 2019-04-26 (×2): qty 1

## 2019-04-26 MED ORDER — SODIUM CHLORIDE 0.9 % IV SOLN: Freq: Once | INTRAVENOUS | Status: AC

## 2019-04-26 MED ORDER — HYDROCODONE-ACETAMINOPHEN 10-325 MG PO TABS: 1.0000 | ORAL_TABLET | ORAL | Status: DC | PRN

## 2019-04-26 MED ORDER — IRBESARTAN 300 MG PO TABS
300.0000 mg | ORAL_TABLET | Freq: Every day | ORAL | Status: DC
  Administered 2019-04-26 – 2019-04-27 (×2): 300 mg via ORAL
  Filled 2019-04-26 (×2): qty 1

## 2019-04-26 MED ORDER — GABAPENTIN 400 MG PO CAPS
400.0000 mg | ORAL_CAPSULE | Freq: Three times a day (TID) | ORAL | Status: DC
  Administered 2019-04-26 – 2019-04-27 (×2): 400 mg via ORAL
  Filled 2019-04-26 (×2): qty 1

## 2019-04-26 MED ORDER — GABAPENTIN 300 MG PO CAPS
300.0000 mg | ORAL_CAPSULE | Freq: Once | ORAL | Status: AC
  Administered 2019-04-26: 300 mg via ORAL
  Filled 2019-04-26: qty 1

## 2019-04-26 MED ORDER — LORAZEPAM 2 MG/ML IJ SOLN
0.5000 mg | Freq: Once | INTRAMUSCULAR | Status: AC
  Administered 2019-04-26: 0.5 mg via INTRAVENOUS
  Filled 2019-04-26: qty 1

## 2019-04-26 MED ORDER — GABAPENTIN 300 MG PO CAPS
600.0000 mg | ORAL_CAPSULE | Freq: Three times a day (TID) | ORAL | Status: DC
  Administered 2019-04-26: 600 mg via ORAL
  Filled 2019-04-26: qty 2

## 2019-04-26 MED ORDER — TERAZOSIN HCL 1 MG PO CAPS
2.0000 mg | ORAL_CAPSULE | Freq: Every day | ORAL | Status: DC
  Filled 2019-04-26: qty 1
  Filled 2019-04-26 (×2): qty 2

## 2019-04-26 MED ORDER — FLUTICASONE PROPIONATE 50 MCG/ACT NA SUSP
1.0000 | Freq: Every day | NASAL | Status: DC
  Administered 2019-04-26 – 2019-04-27 (×2): 1 via NASAL
  Filled 2019-04-26: qty 16

## 2019-04-26 MED ORDER — NEBIVOLOL HCL 5 MG PO TABS
5.0000 mg | ORAL_TABLET | Freq: Every day | ORAL | Status: DC
  Administered 2019-04-26 – 2019-04-27 (×2): 5 mg via ORAL
  Filled 2019-04-26 (×2): qty 1

## 2019-04-26 MED ORDER — ALPRAZOLAM 0.25 MG PO TABS
0.2500 mg | ORAL_TABLET | Freq: Two times a day (BID) | ORAL | Status: DC | PRN
  Administered 2019-04-26: 0.25 mg via ORAL
  Filled 2019-04-26: qty 1

## 2019-04-26 MED ORDER — INSULIN ASPART 100 UNIT/ML ~~LOC~~ SOLN
4.0000 [IU] | Freq: Once | SUBCUTANEOUS | Status: AC
  Administered 2019-04-26: 4 [IU] via SUBCUTANEOUS

## 2019-04-26 MED ORDER — DOCUSATE SODIUM 100 MG PO CAPS
100.0000 mg | ORAL_CAPSULE | Freq: Once | ORAL | Status: AC
  Administered 2019-04-26: 100 mg via ORAL
  Filled 2019-04-26: qty 1

## 2019-04-26 MED ORDER — DILTIAZEM HCL ER COATED BEADS 120 MG PO CP24
120.0000 mg | ORAL_CAPSULE | Freq: Once | ORAL | Status: AC
  Administered 2019-04-26: 120 mg via ORAL
  Filled 2019-04-26: qty 1

## 2019-04-26 MED ORDER — FAMOTIDINE 20 MG PO TABS
20.0000 mg | ORAL_TABLET | Freq: Every day | ORAL | Status: DC
  Administered 2019-04-26 – 2019-04-27 (×2): 20 mg via ORAL
  Filled 2019-04-26 (×2): qty 1

## 2019-04-26 MED ORDER — KETOROLAC TROMETHAMINE 10 MG PO TABS
10.0000 mg | ORAL_TABLET | Freq: Four times a day (QID) | ORAL | Status: DC | PRN
  Filled 2019-04-26: qty 1

## 2019-04-26 MED ORDER — GABAPENTIN ENACARBIL ER 600 MG PO TBCR: 600.0000 mg | EXTENDED_RELEASE_TABLET | Freq: Two times a day (BID) | ORAL | Status: DC

## 2019-04-26 NOTE — ED Notes (Signed)
Pt's 02 sats decreased to 85% after ativan. Pt encouraged to deep breath and sats increased to 95%. Pt. Resting again and 02 sats decreased to 85% on RA. 02 placed at 2L via n/c. 02 sats increased to 94%. Primary RN and MD aware.

## 2019-04-26 NOTE — ED Notes (Signed)
X-ray at bedside

## 2019-04-26 NOTE — ED Notes (Signed)
In room to give aspirin per order.  Noted to be asleep.  O2 sat hanging out in upper 80's to low 90's.  Endorses a hx of sleep apnea.  Placed back on 2L via nasal cannula at this time.

## 2019-04-26 NOTE — ED Notes (Signed)
Pt assisted with urinal

## 2019-04-26 NOTE — ED Notes (Signed)
Spoke with Dr. Dina Rich and she request Novolog coverage for blood sugar (288) that  Resulted at 0315 be given now.

## 2019-04-26 NOTE — ED Notes (Signed)
Patient found with oxygen on forehead with O2 sat noted to be 95-96% on room air.  Left off at this time.

## 2019-04-26 NOTE — Progress Notes (Signed)
Patient admitted in room 10 2W from [facility]. Patient alert oriented X 4 no complaint of pain, VS within normal range. Pt is in bed locked at lower level, bedside table call light and telephone within patient reach. We will continue to monitor patient.

## 2019-04-26 NOTE — ED Notes (Signed)
Pt requesting gabapentin, EDP notified.

## 2019-04-26 NOTE — ED Notes (Signed)
Pt assisted to BSC

## 2019-04-26 NOTE — ED Notes (Signed)
Pt son called, and call transferred to portable phone for pt to be able to speak to son.

## 2019-04-26 NOTE — Plan of Care (Signed)
  Problem: Clinical Measurements: Goal: Ability to maintain clinical measurements within normal limits will improve Outcome: Progressing Goal: Will remain free from infection Outcome: Progressing   Problem: Elimination: Goal: Will not experience complications related to bowel motility Outcome: Progressing   Problem: Safety: Goal: Ability to remain free from injury will improve Outcome: Progressing   

## 2019-04-26 NOTE — ED Notes (Signed)
Called into room by patient.  Yelling "you haven't got my results back yet"  Explained they were not back.  States just package me up and get me out of here.  Upon arrival back at desk results posted.  Water provided with okay from Dr Dina Rich.

## 2019-04-26 NOTE — ED Notes (Signed)
Family called Probation officer) and updated that we are waiting for bed at [facility].

## 2019-04-26 NOTE — H&P (Addendum)
History and Physical    Jason Hartman [id number] DOB: [birth year] DOA: 04/25/2019  PCP: [name], MD   Patient coming from: Home  I have personally briefly reviewed patient's old medical records in [place]  Chief Complaint: I feel better  HPI: THIAGO CURE is a 84 y.o. male with medical history significant of HTN, gout, restless leg syndrome, IDDM, polypharmacy presented with nauseous vomiting.  Patient asked more than 20 medication for his chronic ongoing problem, his wife has been taking care of organize all his medications and slotting all his daily supplies.  But for the last 2 days the patient wife has been sick with her medical problems and was not able to manage the patient's medication supplies, ended up patient did not take any of his blood pressure medications for 2 days, and started to feel nauseous and vomited couple times of stomach content.  Denied any chest pain no short of breath, no cough no fever or chills, no palpitations no headaches no blurred vision no numbness were weakness of any of his limbs.  He did take his insulin for the last 2 days. ED Course: He went to [facility] ER, was found his sugar in the 300s, with mild elevation of anion gap, systolic pressure in [id number], HR in 100s, CT abd showed diverticulosis and a stable ascending thoracic aneurysm.  Review of Systems: As per HPI otherwise 10 point review of systems negative.    Past Medical History:  Diagnosis Date  . Cellulitis and abscess of leg, except foot   . Hypertension   . Obstructive sleep apnea (adult) (pediatric)   . Pain in joint, pelvic region and thigh   . Pneumonia, organism unspecified(486)   . Restless legs syndrome (RLS)   . RLS (restless legs syndrome) 09/01/2012  . Thoracic or lumbosacral neuritis or radiculitis, unspecified   . Type II or unspecified type diabetes mellitus without mention of complication, not stated as uncontrolled   . Unspecified disease of pericardium   .  Unspecified hereditary and idiopathic peripheral neuropathy     Past Surgical History:  Procedure Laterality Date  . ANAL FISSURE REPAIR    . pericarditis  2009     reports that he quit smoking about 38 years ago. He has never used smokeless tobacco. He reports that he does not drink alcohol or use drugs.  Allergies  Allergen Reactions  . Atorvastatin Itching and Other (See Comments)    Tired, weakness Tired, weakness TIRED Tired, weakness   . Colesevelam Other (See Comments)    tired TIRED tired   . Iodinated Diagnostic Agents Other (See Comments)    HIVES HIVES   . Iohexol      Code: HIVES, Desc: alleric to renografin,isovue & omnipaque, hives, requires 13 hr prep//a.calhoun, Onset Date: [id number]   . Lovastatin Other (See Comments)    Tired, nervousness TIRED Tired, nervousness   . Methadone Hcl Other (See Comments)    HALLUCINATIONS HALLUCINATIONS   . Morphine Sulfate   . Other Other (See Comments)    HIVES  . Oxycodone Hcl   . Penicillins   . Promethazine Other (See Comments)    Mental status change DELIRIUM Mental status change   . Quetiapine   . Rosuvastatin Other (See Comments)    Tired, weakness TIRED Tired, weakness   . Zolpidem Other (See Comments)  . Carbidopa-Levodopa Anxiety  . Clindamycin Rash  . Doxazosin Rash  . Iodine Other (See Comments) and Rash    HIVES  .  Linezolid Other (See Comments) and Rash    HIVES     Family History  Problem Relation Age of Onset  . Diabetes Father      Prior to Admission medications   Medication Sig Start Date End Date Taking? Authorizing Provider  aspirin EC 325 MG tablet Take 325-650 mg by mouth daily as needed for mild pain or moderate pain.    Yes [provider]  chlorthalidone (HYGROTON) 25 MG tablet Take 12.5 mg by mouth every morning.  01/09/16  Yes [provider]  co-enzyme Q-10 50 MG capsule Take 50 mg by mouth daily.   Yes [provider]  diltiazem  (CARDIZEM CD) 120 MG 24 hr capsule Take 120 mg by mouth daily.  10/19/18  Yes [provider]  famotidine (PEPCID) 40 MG tablet Take 40 mg by mouth daily.  04/20/14  Yes [provider]  gabapentin (NEURONTIN) 600 MG tablet Take 1,200 mg by mouth 3 (three) times daily.  06/21/16  Yes [provider]  hydrochlorothiazide (HYDRODIURIL) 12.5 MG tablet Take 12.5 mg by mouth daily.  02/25/18  Yes [provider]  L-Methylfolate-Algae-B12-B6 Glade Stanford) 3-90.314-2-35 MG CAPS Take by mouth. 06/11/17  Yes [provider]  liraglutide (VICTOZA) 18 MG/3ML SOPN Inject 1.2 mg into the skin daily.  09/16/16 04/26/19 Yes [provider]  metFORMIN (GLUCOPHAGE-XR) 500 MG 24 hr tablet  07/06/12  Yes [provider]  NEUPRO 8 MG/24HR PT24 Place 3 patches onto the skin every other day. 04/19/19  Yes [provider]  nitroGLYCERIN (NITROSTAT) 0.4 MG SL tablet Place 0.4 mg under the tongue every 5 (five) minutes as needed for chest pain.  01/23/15  Yes [provider]  Omega-3 1000 MG CAPS Take by mouth.   Yes [provider]  valsartan (DIOVAN) 320 MG tablet Take 320 mg by mouth daily.   Yes [provider]  acyclovir (ZOVIRAX) 800 MG tablet Take by mouth. 03/23/12   [provider]  allopurinol (ZYLOPRIM) 100 MG tablet Take 2 tablets daily. Patient not taking: Reported on 04/26/2019 02/03/17   Trula Slade, DPM  allopurinol (ZYLOPRIM) 100 MG tablet TAKE 2 TABLETS BY MOUTH EVERY DAY Patient not taking: Reported on 04/26/2019 03/11/17   Trula Slade, DPM  allopurinol (ZYLOPRIM) 100 MG tablet Take 1 tablet (100 mg total) by mouth daily. Patient not taking: Reported on 04/26/2019 05/16/17   Trula Slade, DPM  allopurinol (ZYLOPRIM) 300 MG tablet Take 300 mg by mouth daily.  05/27/18   [provider]  ALPRAZolam Duanne Moron) 0.5 MG tablet Take 0.5 mg by mouth 2 (two) times daily as needed for anxiety.  10/15/16    [provider]  amantadine (SYMMETREL) 100 MG capsule TK 1 C PO QHS FOR 3 DAYS THEN TK 1 C PO BID 04/23/18   [provider]  amLODipine (NORVASC) 5 MG tablet  06/30/12   [provider]  BYSTOLIC 5 MG tablet  [id number]   [provider]  cetirizine (ZYRTEC) 10 MG tablet Take by mouth.    [provider]  clonazePAM (KLONOPIN) 0.5 MG tablet Take 0.5 mg by mouth 2 (two) times daily.  05/15/18   [provider]  clotrimazole-betamethasone (LOTRISONE) cream Apply to affected area twice daily 04/10/18   [provider]  colchicine 0.6 MG tablet Take 1 tablet (0.6 mg total) by mouth daily. Patient not taking: Reported on 04/26/2019 11/12/16   Trula Slade, DPM  colchicine 0.6 MG tablet Take  1 tablet (0.6 mg total) by mouth daily. 07/31/17   Trula Slade, DPM  desonide (DESOWEN) 0.05 % cream Apply topically.    [provider]  diclofenac sodium (VOLTAREN) 1 % GEL Apply topically.    [provider]  DOCOSAHEXAENOIC ACID PO Take 1 g by mouth.    [provider]  ferrous sulfate 325 (65 FE) MG EC tablet  05/28/18   [provider]  Flunisolide HFA (AEROSPAN) 80 MCG/ACT AERS Inhale into the lungs.    [provider]  fluticasone (FLONASE) 50 MCG/ACT nasal spray Place 2 sprays into the nose daily.  04/11/14   [provider]  fluvoxaMINE (LUVOX) 25 MG tablet  07/22/17   [provider]  Gabapentin Enacarbil (HORIZANT) 600 MG TB24 Take 600 mg by mouth 2 (two) times daily. Patient not taking: Reported on 04/26/2019 09/01/12   Star Age, MD  HYDROcodone-acetaminophen (NORCO) 10-325 MG per tablet Take 1 tablet by mouth every 4 (four) hours as needed (for pain). Patient not taking: Reported on 04/26/2019 12/04/13   Molpus, John, MD  HYDROcodone-acetaminophen (NORCO/VICODIN) 5-325 MG per tablet Take 1-2 tablets by mouth daily.  06/30/12   [provider]  ketorolac (TORADOL) 10  MG tablet Take 1 tablet (10 mg total) by mouth every 6 (six) hours as needed for moderate pain. 12/05/13   Evelina Bucy, MD  L-METHYLFOLATE CALCIUM PO Take by mouth. 06/11/17   [provider]  LANTUS SOLOSTAR 100 UNIT/ML SOPN  08/22/12   [provider]  magnesium citrate SOLN Take by mouth.    [provider]  meloxicam (MOBIC) 15 MG tablet Take 1 tablet (15 mg total) by mouth daily. 11/04/16   Trula Slade, DPM  methylPREDNISolone acetate (DEPO-MEDROL) 80 MG/ML injection by Intra-Lesional route. 09/18/18   [provider]  metoprolol succinate (TOPROL-XL) 25 MG 24 hr tablet Take 25 mg by mouth daily.  09/16/18   [provider]  multivitamin (METANX) 3-35-2 MG TABS tablet Take 1 tablet by mouth 2 (two) times daily. 09/01/12   Star Age, MD  NONFORMULARY OR COMPOUNDED [place]:  Antiinflammatory cream - Diclofenac 3%, Baclofen 2%, Lidocaine 2%, apply 1-2 grams to affected area 3-4 times daily. 10/30/16   Trula Slade, DPM  olmesartan (BENICAR) 40 MG tablet Take 40 mg by mouth daily.  11/08/16   [provider]  omega-3 acid ethyl esters (LOVAZA) 1 G capsule Take 2 g by mouth 2 (two) times daily. 4 Tabs qd    [provider]  ONE TOUCH ULTRA TEST test strip  07/02/12   [provider]  oxybutynin (DITROPAN) 5 MG tablet TK 1 T PO BID 05/12/18   [provider]  STENDRA 200 MG TABS as needed. 09/23/12   [provider]  tamsulosin (FLOMAX) 0.4 MG CAPS capsule Take one capsule daily until stone passes. Patient not taking: Reported on 04/26/2019 12/04/13   Molpus, John, MD  terazosin (HYTRIN) 2 MG capsule Take by mouth. 06/09/17   [provider]  testosterone cypionate (DEPOTESTOTERONE CYPIONATE) 100 MG/ML injection Inject into the muscle. 12/17/16   [provider]  Ubiquinol 100 MG CAPS Take by mouth. 03/23/12   [provider]  Urea 39 % CREA Apply topically as needed     [provider]  Vilazodone HCl (VIIBRYD) 10 MG TABS Take 1/2 a day for a week, then 1 a day 10/30/16   [provider]    Physical Exam: Vitals:  04/26/19 1200 04/26/19 1245 04/26/19 1330 04/26/19 1556  BP: 138/71 117/71 (!) 141/71 121/64  Pulse: (!) 103 91 95 85  Resp: (!) 27 20 (!) 23 19  Temp:    98.3 F (36.8 C)  TempSrc:    Oral  SpO2: 90% (!) 85% 90% 96%  Weight:      Height:        Constitutional: NAD, calm, comfortable Vitals:   04/26/19 1200 04/26/19 1245 04/26/19 1330 04/26/19 1556  BP: 138/71 117/71 (!) 141/71 121/64  Pulse: (!) 103 91 95 85  Resp: (!) 27 20 (!) 23 19  Temp:    98.3 F (36.8 C)  TempSrc:    Oral  SpO2: 90% (!) 85% 90% 96%  Weight:      Height:       Eyes: PERRL, lids and conjunctivae normal ENMT: Mucous membranes are moist. Posterior pharynx clear of any exudate or lesions.Normal dentition.  Neck: normal, supple, no masses, no thyromegaly Respiratory: clear to auscultation bilaterally, no wheezing, no crackles. Normal respiratory effort. No accessory muscle use.  Cardiovascular: Regular rate and rhythm, no murmurs / rubs / gallops. No extremity edema. 2+ pedal pulses. No carotid bruits.  Abdomen: no tenderness, no masses palpated. No hepatosplenomegaly. Bowel sounds positive.  Musculoskeletal: no clubbing / cyanosis. No joint deformity upper and lower extremities. Good ROM, no contractures. Normal muscle tone.  Skin: no rashes, lesions, ulcers. No induration Neurologic: CN 2-12 grossly intact. Sensation intact, DTR normal. Strength 5/5 in all 4.  Psychiatric: Normal judgment and insight. Alert and oriented x 3. Normal mood.     Labs on Admission: I have personally reviewed following labs and imaging studies  CBC: Recent Labs  Lab 04/25/19 2343  WBC 13.8*  NEUTROABS 12.2*  HGB 17.7*  HCT 54.5*  MCV 89.3  PLT 0000000   Basic Metabolic Panel: Recent Labs  Lab 04/26/19 0019 04/26/19 0145  NA 137 139  K 4.0 3.8  CL  100 103  CO2 20* 21*  GLUCOSE 308* 288*  BUN 25* 26*  CREATININE 1.15 1.16  CALCIUM 9.8 9.2   GFR: Estimated Creatinine Clearance: 57.4 mL/min (by C-G formula based on SCr of 1.16 mg/dL). Liver Function Tests: Recent Labs  Lab 04/26/19 0019  AST 33  ALT 53*  ALKPHOS 52  BILITOT 2.1*  PROT 7.9  ALBUMIN 4.3   Recent Labs  Lab 04/26/19 0019  LIPASE 23   No results for input(s): AMMONIA in the last 168 hours. Coagulation Profile: No results for input(s): INR, PROTIME in the last 168 hours. Cardiac Enzymes: No results for input(s): CKTOTAL, CKMB, CKMBINDEX, TROPONINI in the last 168 hours. BNP (last 3 results) No results for input(s): PROBNP in the last 8760 hours. HbA1C: No results for input(s): HGBA1C in the last 72 hours. CBG: Recent Labs  Lab 04/26/19 0748 04/26/19 1405 04/26/19 1620  GLUCAP 147* 224* 141*   Lipid Profile: No results for input(s): CHOL, HDL, LDLCALC, TRIG, CHOLHDL, LDLDIRECT in the last 72 hours. Thyroid Function Tests: No results for input(s): TSH, T4TOTAL, FREET4, T3FREE, THYROIDAB in the last 72 hours. Anemia Panel: No results for input(s): VITAMINB12, FOLATE, FERRITIN, TIBC, IRON, RETICCTPCT in the last 72 hours. Urine analysis:    Component Value Date/Time   COLORURINE YELLOW 04/26/2019 0038   APPEARANCEUR CLEAR 04/26/2019 0038   LABSPEC >1.030 (H) 04/26/2019 0038   PHURINE 5.5 04/26/2019 0038   GLUCOSEU >=500 (A) 04/26/2019 0038   HGBUR LARGE (A) 04/26/2019 0038   BILIRUBINUR NEGATIVE  04/26/2019 0038   KETONESUR 40 (A) 04/26/2019 0038   PROTEINUR >300 (A) 04/26/2019 0038   UROBILINOGEN 0.2 12/04/2013 0210   NITRITE NEGATIVE 04/26/2019 0038   LEUKOCYTESUR NEGATIVE 04/26/2019 0038    Radiological Exams on Admission: CT ABDOMEN PELVIS WO CONTRAST  Result Date: 04/26/2019 CLINICAL DATA:  Nausea and vomiting. EXAM: CT ABDOMEN AND PELVIS WITHOUT CONTRAST TECHNIQUE: Multidetector CT imaging of the abdomen and pelvis was performed  following the standard protocol without IV contrast. COMPARISON:  November 10, 2018 FINDINGS: Lower chest: There is atelectasis versus scarring at the lung bases the ascending thoracic aorta appears to be at least 4.1 cm in diameter and therefore is aneurysmal. Aortic calcifications are noted. Coronary artery calcifications are noted. Hepatobiliary: There is decreased hepatic attenuation suggestive of hepatic steatosis. Normal gallbladder.There is no biliary ductal dilation. Pancreas: Normal contours without ductal dilatation. No peripancreatic fluid collection. Spleen: No splenic laceration or hematoma. Adrenals/Urinary Tract: --Adrenal glands: No adrenal hemorrhage. --Right kidney/ureter: Again noted is a hyperdense exophytic structure arising from the interpolar region of the right kidney measuring approximately 1.2 cm. Multiple nonobstructing stones are noted. Multiple cysts are noted. --Left kidney/ureter: There is an exophytic hyperdense 1.6 cm nodule rising from the interpolar region of the left kidney. There is no hydronephrosis. There are no radiopaque obstructing kidney stones. --Urinary bladder: Unremarkable. Stomach/Bowel: --Stomach/Duodenum: No hiatal hernia or other gastric abnormality. Normal duodenal course and caliber. --Small bowel: No dilatation or inflammation. --Colon: Rectosigmoid diverticulosis without acute inflammation. --Appendix: Normal. Vascular/Lymphatic: Atherosclerotic calcification is present within the non-aneurysmal abdominal aorta, without hemodynamically significant stenosis. --No retroperitoneal lymphadenopathy. --there are mildly enlarged mesenteric lymph nodes, stable from prior study. --No pelvic or inguinal lymphadenopathy. Reproductive: Unremarkable Other: No ascites or free air. There are bilateral fat containing inguinal hernias. Musculoskeletal. No acute displaced fractures. IMPRESSION: 1. Rectosigmoid diverticulosis without acute diverticulitis. 2. Stable ascending thoracic  aortic aneurysm. 3. Hepatic steatosis. 4. Bilateral fat containing inguinal hernias. Aortic Atherosclerosis (ICD10-I70.0). Electronically Signed   By: Constance Holster M.D.   On: 04/26/2019 02:32   DG Chest Portable 1 View  Result Date: 04/26/2019 CLINICAL DATA:  Elevated troponin. Nausea and vomiting. EXAM: PORTABLE CHEST 1 VIEW COMPARISON:  Lung bases from abdominal CT earlier this day. FINDINGS: Low lung volumes. Mild streaky bibasilar atelectasis. Heart appears normal in size, not well assessed due to positioning and low lung volumes. Aortic atherosclerosis. No pulmonary edema, large pleural effusion or pneumothorax. Degenerative change in the left shoulder. IMPRESSION: Hypoventilatory chest with bibasilar atelectasis. Aortic Atherosclerosis (ICD10-I70.0). Electronically Signed   By: Keith Rake M.D.   On: 04/26/2019 04:10    EKG: Independently reviewed.   Assessment/Plan Active Problems:   Hypertensive urgency   Malignant HTN with heart disease, w/o CHF, w/o chronic kidney disease  HTN emergency, nausea shortly patient on 3 different blood pressure medications and none of them are on max dosage.  However when I offered to adjust his BP meds the patient was not very happy and want to discuss with his PCP. I stop his HCTZ and chlorthalidone because those 2 will precipitate his gout flareup.  Stop his Cardizem, we can titrate up the beta-blocker.  Continue amlodipine and ARB. Not very clear whether this hypertension emergency episode comes solely from noncompliance with BP meds versus withdrawing from his benzo, and the fact that his blood pressure significant improved after given IV Ativan at [facility] ER. Downgrade to MedSurg, likely can be discharged in 24 hours.  Elevated troponins, likely from uncontrolled hypertension,  pattern is flat, doubt ACS.  1 more set of Trop tomorrow morning.  EKG showed some nonspecific T wave flattening in the lateral leads, repeat EKG. also ordered  echocardiogram.  Polypharmacy, but reluctant to cut any of his medications, challenged " each medication was ordered by a specialist"  Impending DKA, resolved.  On sliding scale.  Leukocytosis, probably reactive and from hemoconcentration as RBC level also increased.  Check tomorrow.  Bilirubinemia, no RUQ tenderness on physical exam, CT abdomen showed no significant hepatobiliary etiology, will order RUQ ultrasound and repeat LFT tomorrow.    DVT prophylaxis: Heparin subcu Code Status: Full code Family Communication: None at bedside Disposition Plan: Home Consults called: None Admission status: PCU admission, downgrade to MedSurg   Lequita Halt MD [facility] Pager [phone]  If 7PM-7AM, please contact night-coverage [email or web address] Password [facility]  04/26/2019, 6:33 PM

## 2019-04-26 NOTE — ED Notes (Signed)
Called into room once again wanting to know when his legs will stop "bothering him".  Encouraged to give gabapentin time to work.  Assisted to stand beside bed to urinate.  Continues to ask for something to drink.  Encouraged to wait for CT results.

## 2019-04-27 ENCOUNTER — Observation Stay (HOSPITAL_COMMUNITY): Payer: Medicare Other

## 2019-04-27 ENCOUNTER — Observation Stay (HOSPITAL_BASED_OUTPATIENT_CLINIC_OR_DEPARTMENT_OTHER): Payer: Medicare Other

## 2019-04-27 DIAGNOSIS — K760 Fatty (change of) liver, not elsewhere classified: Secondary | ICD-10-CM | POA: Diagnosis not present

## 2019-04-27 DIAGNOSIS — I119 Hypertensive heart disease without heart failure: Secondary | ICD-10-CM | POA: Diagnosis not present

## 2019-04-27 DIAGNOSIS — E860 Dehydration: Secondary | ICD-10-CM

## 2019-04-27 DIAGNOSIS — R778 Other specified abnormalities of plasma proteins: Secondary | ICD-10-CM | POA: Diagnosis not present

## 2019-04-27 DIAGNOSIS — R9431 Abnormal electrocardiogram [ECG] [EKG]: Secondary | ICD-10-CM | POA: Diagnosis not present

## 2019-04-27 DIAGNOSIS — R945 Abnormal results of liver function studies: Secondary | ICD-10-CM | POA: Diagnosis not present

## 2019-04-27 LAB — BASIC METABOLIC PANEL
Anion gap: 9 (ref 5–15)
Anion gap: 12 (ref 5–15)
BUN: 34 mg/dL — ABNORMAL HIGH (ref 8–23)
BUN: 34 mg/dL — ABNORMAL HIGH (ref 8–23)
CO2: 25 mmol/L (ref 22–32)
CO2: 22 mmol/L (ref 22–32)
Calcium: 8.2 mg/dL — ABNORMAL LOW (ref 8.9–10.3)
Calcium: 8.5 mg/dL — ABNORMAL LOW (ref 8.9–10.3)
Chloride: 104 mmol/L (ref 98–111)
Chloride: 102 mmol/L (ref 98–111)
Creatinine, Ser: 1.53 mg/dL — ABNORMAL HIGH (ref 0.61–1.24)
Creatinine, Ser: 1.36 mg/dL — ABNORMAL HIGH (ref 0.61–1.24)
GFR calc Af Amer: 49 mL/min — ABNORMAL LOW (ref >60)
GFR calc Af Amer: 56 mL/min — ABNORMAL LOW (ref >60)
GFR calc non Af Amer: 42 mL/min — ABNORMAL LOW (ref >60)
GFR calc non Af Amer: 48 mL/min — ABNORMAL LOW (ref >60)
Glucose, Bld: 179 mg/dL — ABNORMAL HIGH (ref 70–99)
Glucose, Bld: 245 mg/dL — ABNORMAL HIGH (ref 70–99)
Potassium: 3.7 mmol/L (ref 3.5–5.1)
Potassium: 3.9 mmol/L (ref 3.5–5.1)
Sodium: 138 mmol/L (ref 135–145)
Sodium: 136 mmol/L (ref 135–145)

## 2019-04-27 LAB — HEPATIC FUNCTION PANEL
ALT: 44 U/L (ref 0–44)
AST: 36 U/L (ref 15–41)
Albumin: 3.5 g/dL (ref 3.5–5.0)
Alkaline Phosphatase: 46 U/L (ref 38–126)
Bilirubin, Direct: 0.3 mg/dL — ABNORMAL HIGH (ref 0.0–0.2)
Indirect Bilirubin: 0.9 mg/dL (ref 0.3–0.9)
Total Bilirubin: 1.2 mg/dL (ref 0.3–1.2)
Total Protein: 6.4 g/dL — ABNORMAL LOW (ref 6.5–8.1)

## 2019-04-27 LAB — GLUCOSE, CAPILLARY
Glucose-Capillary: 171 mg/dL — ABNORMAL HIGH (ref 70–99)
Glucose-Capillary: 234 mg/dL — ABNORMAL HIGH (ref 70–99)
Glucose-Capillary: 244 mg/dL — ABNORMAL HIGH (ref 70–99)
Glucose-Capillary: 216 mg/dL — ABNORMAL HIGH (ref 70–99)

## 2019-04-27 LAB — ECHOCARDIOGRAM COMPLETE
Height: 69 in
Weight: 3424 oz

## 2019-04-27 LAB — TROPONIN I (HIGH SENSITIVITY): Troponin I (High Sensitivity): 32 ng/L — ABNORMAL HIGH (ref <18)

## 2019-04-27 IMAGING — US US ABDOMEN LIMITED
1 series · 14 of 25 positions shown · non-contrast
Comparison: CT yesterday

CLINICAL DATA: Abnormal LFTs.

EXAM:
ULTRASOUND ABDOMEN LIMITED RIGHT UPPER QUADRANT

[Series 1: us abdomen limited · 14 of 39 slices shown]
[im 1/39]
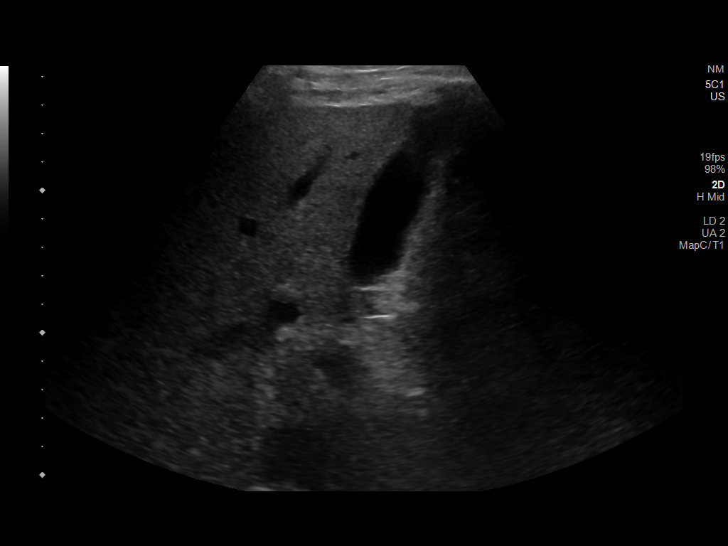
[im 4/39]
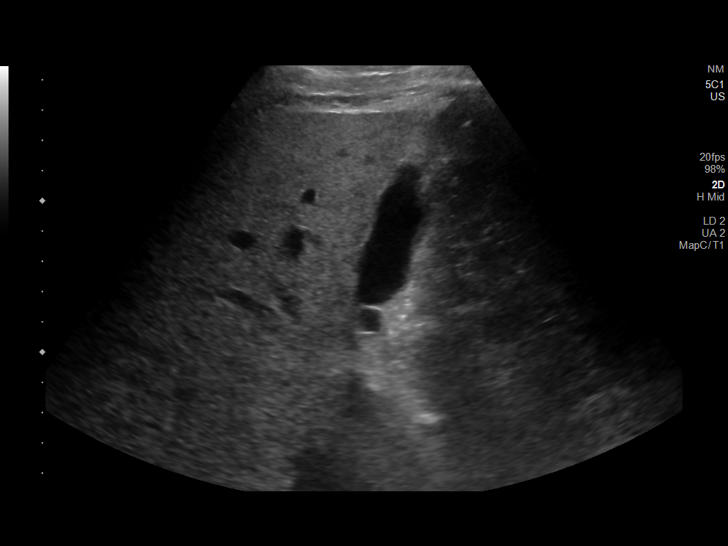
[im 7/39]
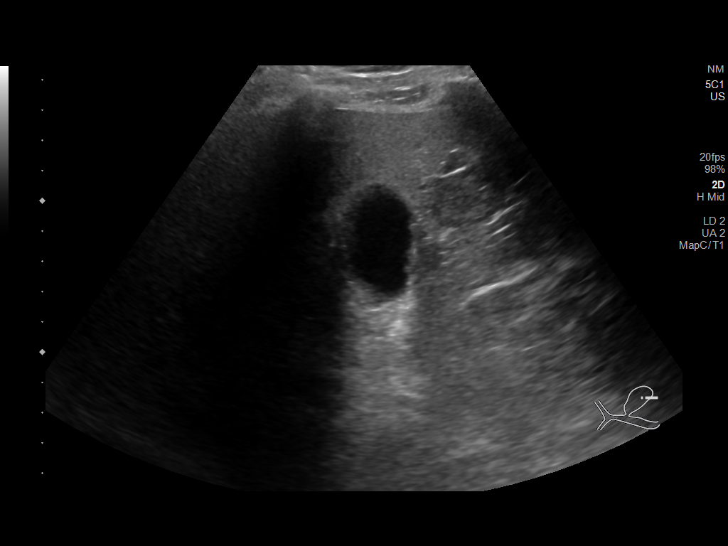
[im 10/39]
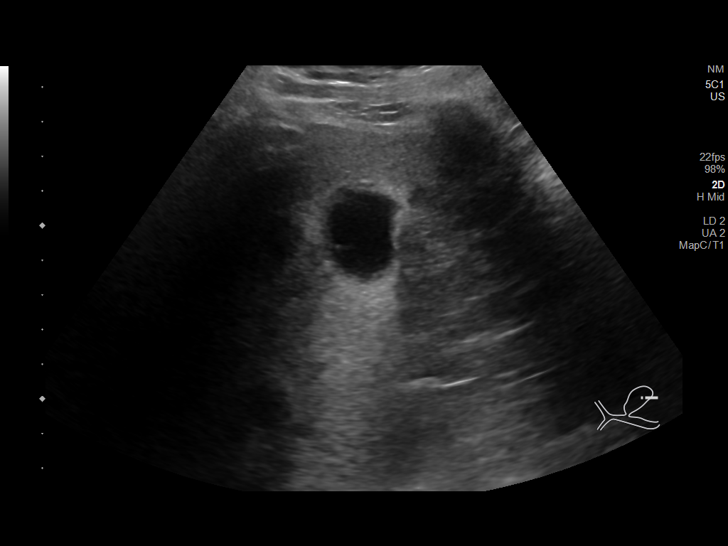
[im 13/39]
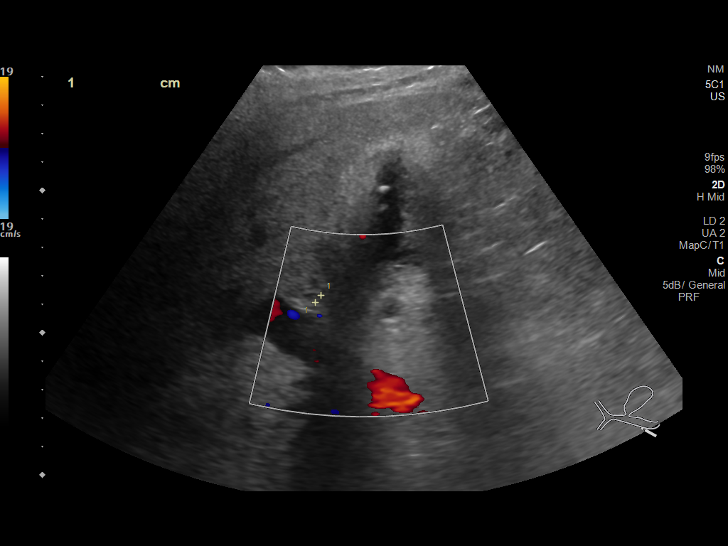
[im 15/39]
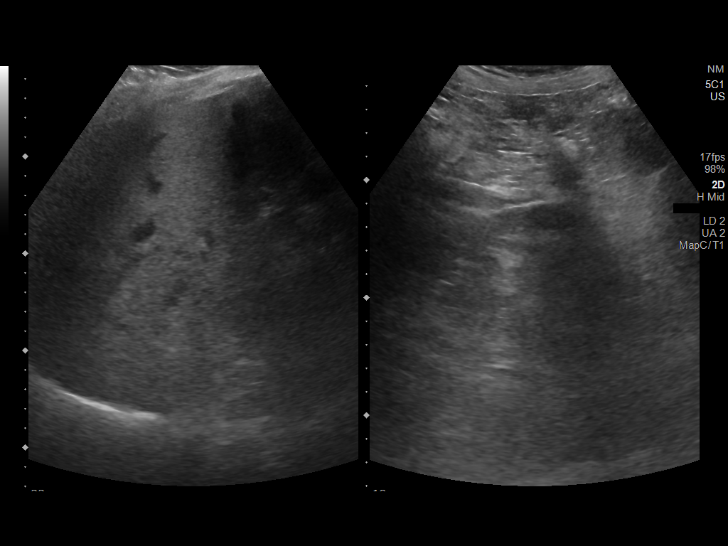
[im 18/39]
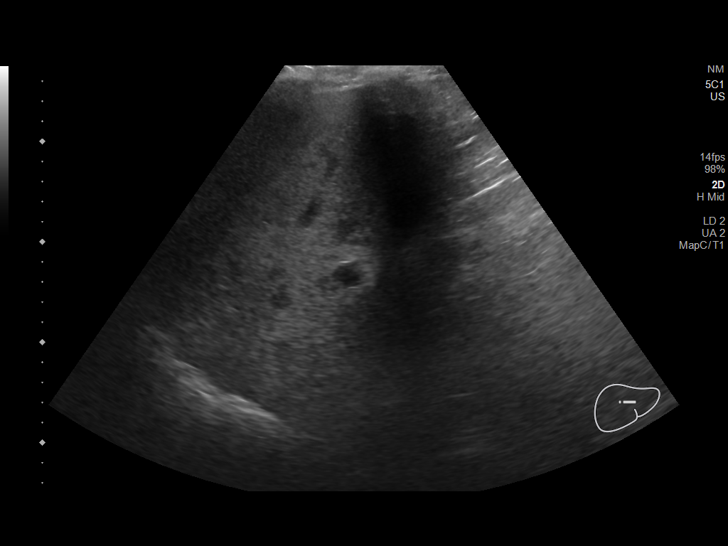
[im 21/39]
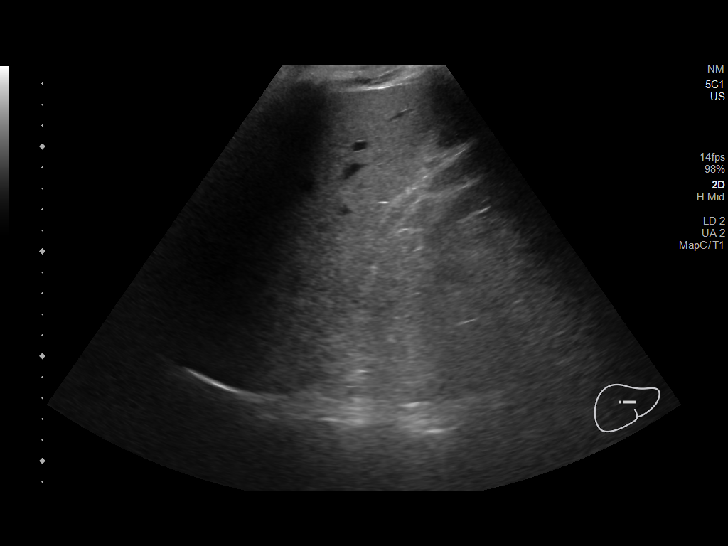
[im 24/39]
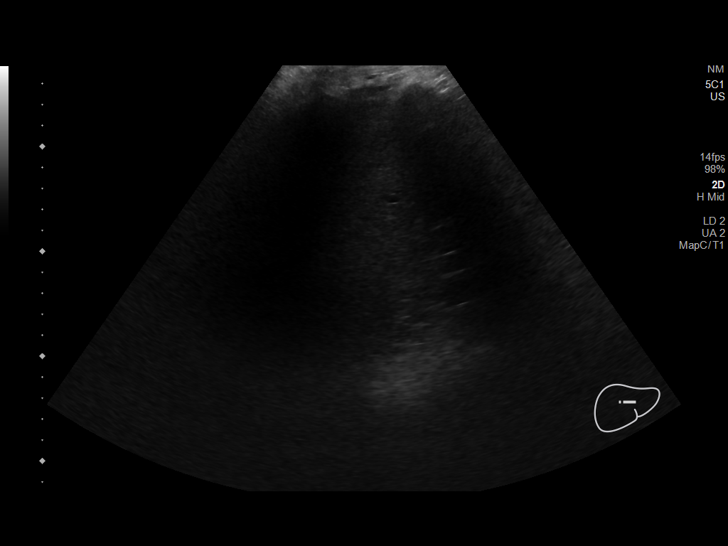
[im 26/39]
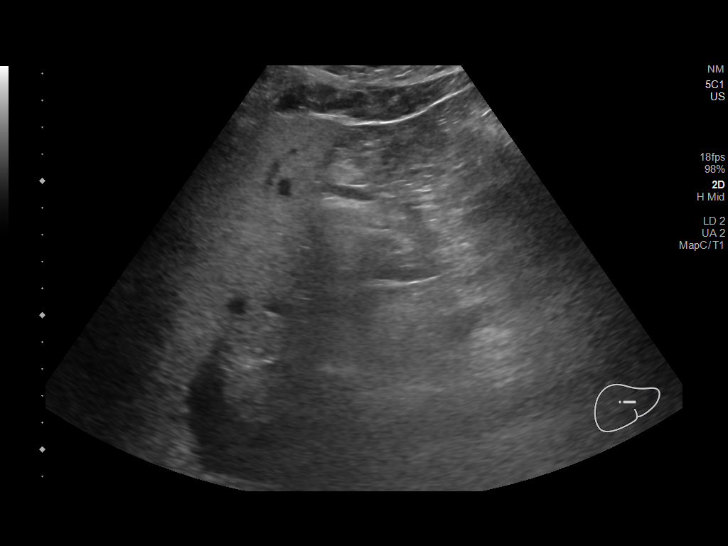
[im 29/39]
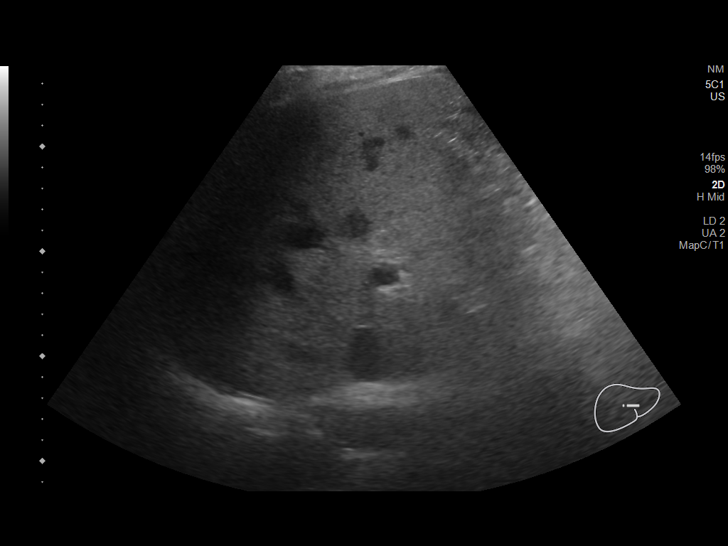
[im 32/39]
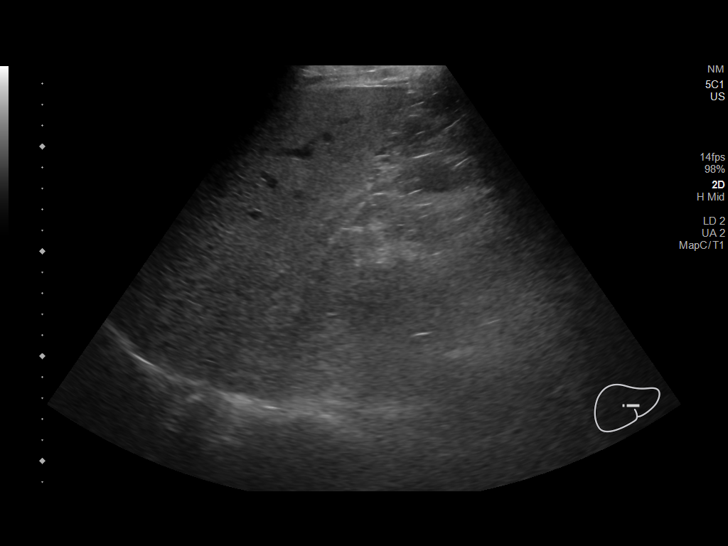
[im 35/39]
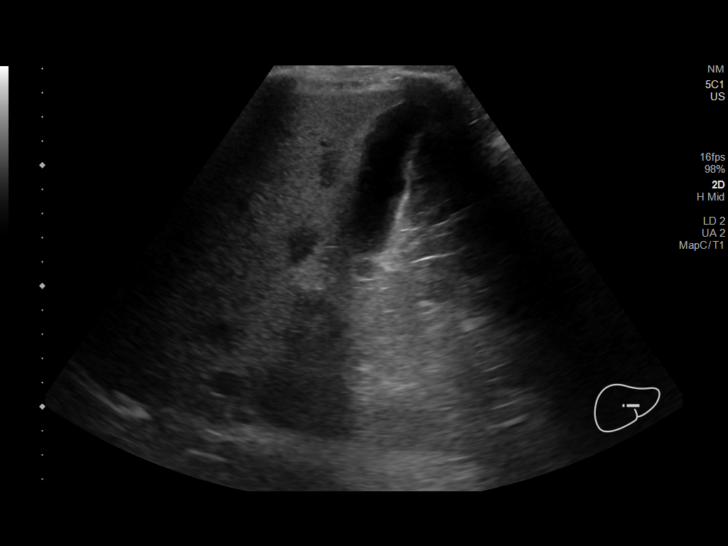
[im 39/39]
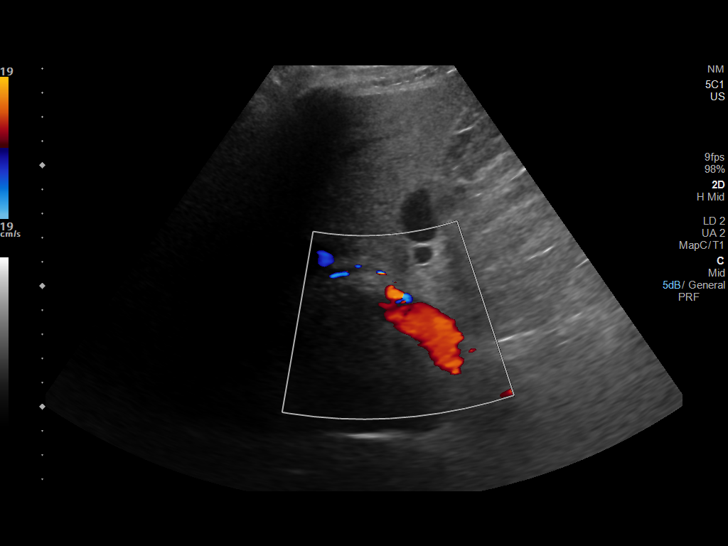

[14 of 25 positions shown; findings below may reference images not displayed]

FINDINGS: Gallbladder:

No gallstones or wall thickening visualized. No sonographic Murphy
sign noted by sonographer.

Common bile duct:

Diameter: 3 mm, normal.

Liver:

No focal lesion identified. Heterogeneously increased in parenchymal
echogenicity. Focal fatty sparing adjacent the gallbladder fossa.
Portal vein is patent on color Doppler imaging with normal direction
of blood flow towards the liver.

Other: None.
IMPRESSION: 1. Hepatic steatosis.
2. Normal sonographic appearance of gallbladder. No biliary
dilatation.

## 2019-04-27 MED ORDER — NEUPRO 8 MG/24HR TD PT24: 1.0000 | MEDICATED_PATCH | Freq: Every day | TRANSDERMAL | Status: DC

## 2019-04-27 MED ORDER — ASPIRIN EC 81 MG PO TBEC: 81.0000 mg | DELAYED_RELEASE_TABLET | Freq: Every day | ORAL | 2 refills | Status: DC

## 2019-04-27 MED ORDER — GABAPENTIN 600 MG PO TABS: 600.0000 mg | ORAL_TABLET | Freq: Three times a day (TID) | ORAL | Status: DC

## 2019-04-27 NOTE — Care Management Obs Status (Signed)
[place] NOTIFICATION   Patient Details  Name: Jason Hartman MRN: [id number] Date of Birth: 1935-04-29   Medicare Observation Status Notification Given:  Yes    Pollie Friar, RN 04/27/2019, 2:34 PM

## 2019-04-27 NOTE — TOC Transition Note (Signed)
Transition of Care [facility]) - CM/SW Discharge Note   Patient Details  Name: Jason Hartman MRN: [id number] Date of Birth: 1935/07/23  Transition of Care [facility]) CM/SW Contact:  Pollie Friar, RN Phone Number: 04/27/2019, 2:34 PM   Clinical Narrative:    Pt discharging home with self care. Pt has supervision at home.  Son is to provide transportation home today. They deny any issues with home medications.    Final next level of care: Home/Self Care Barriers to Discharge: No Barriers Identified   Patient Goals and CMS Choice        Discharge Placement                       Discharge Plan and Services                                     Social Determinants of Health (SDOH) Interventions     Readmission Risk Interventions No flowsheet data found.

## 2019-04-27 NOTE — Plan of Care (Signed)
Care plan goals met - pt adequate for discharge 

## 2019-04-27 NOTE — Progress Notes (Signed)
  Echocardiogram 2D Echocardiogram has been performed.  Jason Hartman 04/27/2019, 9:09 AM

## 2019-04-27 NOTE — Progress Notes (Signed)
Pt expresses wish to leave AMA. AMA form at the bedside. Pt informed that he may need to pay for his stay if he leaves AMA - pt agreed to stay.

## 2019-04-27 NOTE — Progress Notes (Signed)
Patient was stable at discharge. I removed his IV. We reviewed the discharge education. Patient and son verbalized understanding and had no further questions. Patient left with belongings in hand.

## 2019-04-27 NOTE — Discharge Summary (Addendum)
Physician Discharge Summary  Jason Hartman [id number] DOB: 03/01/1936 DOA: 04/25/2019  PCP: [name], MD  Admit date: 04/25/2019 Discharge date: 04/27/2019  Admitted From: Home Discharge disposition: Home   Recommendations for Outpatient Follow-Up:   1. Patient's wife has been sick so he has not been getting his medications at home I suspect this was the root of the reason patient came here 2. I spoke with a nurse at [facility] at [place] and got a copy of patient's home meds to verify and I have updated his home meds to the proper medications in epic 3. Held HCTZ due to dehydration (was NPO for RUQ U/Q)-- consider resuming at next office visit 4. CMP at next office visit 5. Son to help with medications   Discharge Diagnosis:   Active Problems:   Hypertensive urgency   Malignant HTN with heart disease, w/o CHF, w/o chronic kidney disease   Elevated troponin   LFTs abnormal    Discharge Condition: Improved.  Diet recommendation: Low sodium, heart healthy.  Carbohydrate-modified.  Wound care: None.  Code status: Full.   History of Present Illness:  Jason Hartman is a 84 y.o. male with medical history significant of HTN, gout, restless leg syndrome, IDDM, polypharmacy presented with nauseous vomiting.  Patient asked more than 20 medication for his chronic ongoing problem, his wife has been taking care of organize all his medications and slotting all his daily supplies.  But for the last 2 days the patient wife has been sick with her medical problems and was not able to manage the patient's medication supplies, ended up patient did not take any of his blood pressure medications for 2 days, and started to feel nauseous and vomited couple times of stomach content.  Denied any chest pain no short of breath, no cough no fever or chills, no palpitations no headaches no blurred vision no numbness were weakness of any of his limbs.  He did take his insulin for the last 2  days. ED Course: He went to [facility] ER, was found his sugar in the 300s, with mild elevation of anion gap, systolic pressure in [id number], HR in 100s, CT abd showed diverticulosis and a stable ascending thoracic aneurysm.   Hospital Course by Problem:   HTN emergency Not very clear whether this hypertension emergency episode comes solely from noncompliance with BP meds versus withdrawing from his benzo, and the fact that his blood pressure significant improved after given IV Ativan at [facility] ER.  Elevated troponins, likely from uncontrolled hypertension, pattern is flat, doubt ACS.   -EKG showed some nonspecific T wave flattening in the lateral leads -echo:  Left ventricular ejection fraction, by visual estimation, is 55 to 60%. The left ventricle has normal function. There is moderately increased left ventricular hypertrophy.  2. The left ventricle has no regional wall motion abnormalities.  3. Global right ventricle has normal systolic function.The right ventricular size is normal. No increase in right ventricular wall thickness.  Leukocytosis -Trending down so likely reactive  Bilirubinemia -Question if due to dehydration -Check CMP at next office visit  Uncontrolled DM type 2- hyperglycemia -not taking meds at home due to wife's hospitalization -resume home meds under son's supervision    Medical Consultants:      Discharge Exam:   Vitals:   04/27/19 0744 04/27/19 0748  BP: (!) 143/81 (!) 143/81  Pulse: 89 88  Resp:    Temp: 98.4 F (36.9 C)   SpO2:  Vitals:   04/26/19 2021 04/27/19 0528 04/27/19 0744 04/27/19 0748  BP: 120/66 (!) 111/58 (!) 143/81 (!) 143/81  Pulse: 81 69 89 88  Resp:  18    Temp: 97.8 F (36.6 C) 98.7 F (37.1 C) 98.4 F (36.9 C)   TempSrc: Oral Oral Oral   SpO2: 96% 94%    Weight:      Height:        General exam: Anxious about wife's hospitalization   The results of significant diagnostics from this hospitalization  (including imaging, microbiology, ancillary and laboratory) are listed below for reference.     Procedures and Diagnostic Studies:   CT ABDOMEN PELVIS WO CONTRAST  Result Date: 04/26/2019 CLINICAL DATA:  Nausea and vomiting. EXAM: CT ABDOMEN AND PELVIS WITHOUT CONTRAST TECHNIQUE: Multidetector CT imaging of the abdomen and pelvis was performed following the standard protocol without IV contrast. COMPARISON:  November 10, 2018 FINDINGS: Lower chest: There is atelectasis versus scarring at the lung bases the ascending thoracic aorta appears to be at least 4.1 cm in diameter and therefore is aneurysmal. Aortic calcifications are noted. Coronary artery calcifications are noted. Hepatobiliary: There is decreased hepatic attenuation suggestive of hepatic steatosis. Normal gallbladder.There is no biliary ductal dilation. Pancreas: Normal contours without ductal dilatation. No peripancreatic fluid collection. Spleen: No splenic laceration or hematoma. Adrenals/Urinary Tract: --Adrenal glands: No adrenal hemorrhage. --Right kidney/ureter: Again noted is a hyperdense exophytic structure arising from the interpolar region of the right kidney measuring approximately 1.2 cm. Multiple nonobstructing stones are noted. Multiple cysts are noted. --Left kidney/ureter: There is an exophytic hyperdense 1.6 cm nodule rising from the interpolar region of the left kidney. There is no hydronephrosis. There are no radiopaque obstructing kidney stones. --Urinary bladder: Unremarkable. Stomach/Bowel: --Stomach/Duodenum: No hiatal hernia or other gastric abnormality. Normal duodenal course and caliber. --Small bowel: No dilatation or inflammation. --Colon: Rectosigmoid diverticulosis without acute inflammation. --Appendix: Normal. Vascular/Lymphatic: Atherosclerotic calcification is present within the non-aneurysmal abdominal aorta, without hemodynamically significant stenosis. --No retroperitoneal lymphadenopathy. --there are mildly  enlarged mesenteric lymph nodes, stable from prior study. --No pelvic or inguinal lymphadenopathy. Reproductive: Unremarkable Other: No ascites or free air. There are bilateral fat containing inguinal hernias. Musculoskeletal. No acute displaced fractures. IMPRESSION: 1. Rectosigmoid diverticulosis without acute diverticulitis. 2. Stable ascending thoracic aortic aneurysm. 3. Hepatic steatosis. 4. Bilateral fat containing inguinal hernias. Aortic Atherosclerosis (ICD10-I70.0). Electronically Signed   By: Constance Holster M.D.   On: 04/26/2019 02:32   DG Chest Portable 1 View  Result Date: 04/26/2019 CLINICAL DATA:  Elevated troponin. Nausea and vomiting. EXAM: PORTABLE CHEST 1 VIEW COMPARISON:  Lung bases from abdominal CT earlier this day. FINDINGS: Low lung volumes. Mild streaky bibasilar atelectasis. Heart appears normal in size, not well assessed due to positioning and low lung volumes. Aortic atherosclerosis. No pulmonary edema, large pleural effusion or pneumothorax. Degenerative change in the left shoulder. IMPRESSION: Hypoventilatory chest with bibasilar atelectasis. Aortic Atherosclerosis (ICD10-I70.0). Electronically Signed   By: Keith Rake M.D.   On: 04/26/2019 04:10     Labs:   Basic Metabolic Panel: Recent Labs  Lab 04/26/19 0019 04/26/19 0019 04/26/19 0145 04/26/19 0145 04/27/19 0423 04/27/19 1015  NA 137  --  139  --  138 136  K 4.0   < > 3.8   < > 3.7 3.9  CL 100  --  103  --  104 102  CO2 20*  --  21*  --  25 22  GLUCOSE 308*  --  288*  --  179* 245*  BUN 25*  --  26*  --  34* 34*  CREATININE 1.15  --  1.16  --  1.53* 1.36*  CALCIUM 9.8  --  9.2  --  8.2* 8.5*   < > = values in this interval not displayed.   GFR Estimated Creatinine Clearance: 49 mL/min (A) (by C-G formula based on SCr of 1.36 mg/dL (H)). Liver Function Tests: Recent Labs  Lab 04/26/19 0019 04/27/19 0423  AST 33 36  ALT 53* 44  ALKPHOS 52 46  BILITOT 2.1* 1.2  PROT 7.9 6.4*  ALBUMIN  4.3 3.5   Recent Labs  Lab 04/26/19 0019  LIPASE 23   No results for input(s): AMMONIA in the last 168 hours. Coagulation profile No results for input(s): INR, PROTIME in the last 168 hours.  CBC: Recent Labs  Lab 04/25/19 2343  WBC 13.8*  NEUTROABS 12.2*  HGB 17.7*  HCT 54.5*  MCV 89.3  PLT 152   Cardiac Enzymes: No results for input(s): CKTOTAL, CKMB, CKMBINDEX, TROPONINI in the last 168 hours. BNP: Invalid input(s): POCBNP CBG: Recent Labs  Lab 04/26/19 2038 04/27/19 0123 04/27/19 0748 04/27/19 0750 04/27/19 1054  GLUCAP 349* 171* 244* 234* 216*   D-Dimer No results for input(s): DDIMER in the last 72 hours. Hgb A1c Recent Labs    04/26/19 1857  HGBA1C 9.1*   Lipid Profile No results for input(s): CHOL, HDL, LDLCALC, TRIG, CHOLHDL, LDLDIRECT in the last 72 hours. Thyroid function studies No results for input(s): TSH, T4TOTAL, T3FREE, THYROIDAB in the last 72 hours.  Invalid input(s): FREET3 Anemia work up No results for input(s): VITAMINB12, FOLATE, FERRITIN, TIBC, IRON, RETICCTPCT in the last 72 hours. Microbiology Recent Results (from the past 240 hour(s))  SARS Coronavirus 2 by RT PCR (hospital order, performed in [facility] hospital lab) Nasopharyngeal Nasopharyngeal Swab     Status: None   Collection Time: 04/26/19  3:44 AM   Specimen: Nasopharyngeal Swab  Result Value Ref Range Status   SARS Coronavirus 2 NEGATIVE NEGATIVE Final    Comment: Performed at [facility], [address] [place], [place] [address]     Discharge Instructions:   Discharge Instructions    Diet - low sodium heart healthy   Complete by: As directed    Diet Carb Modified   Complete by: As directed    Increase activity slowly   Complete by: As directed    Increase activity slowly   Complete by: As directed      Allergies as of 04/27/2019      Reactions   Atorvastatin Itching, Other (See Comments)   Tired, weakness   Colesevelam Other (See  Comments)   tired   Iodinated Diagnostic Agents Hives      Iohexol Hives   Code: HIVES, Desc: alleric to renografin,isovue & omnipaque, hives, requires 13 hr prep//a.calhoun, Onset Date: [id number]   Lovastatin Other (See Comments)   Tired, nervousness   Methadone Hcl Other (See Comments)   HALLUCINATIONS   Morphine Sulfate Other (See Comments)   Pt not sure about allergy    Other Hives   Oxycodone Hcl Other (See Comments)   Pt not sure about allergy    Penicillins Other (See Comments)   Pt not sure about allergy    Promethazine Other (See Comments)   Mental status change, DELIRIUM   Quetiapine Other (See Comments)   Pt not sure about allergy    Rosuvastatin Other (See Comments)   Tired, weakness  Zolpidem Other (See Comments)   Carbidopa-levodopa Anxiety   Clindamycin Rash   Doxazosin Rash   Iodine Hives, Rash   Linezolid Hives, Rash         Medication List    STOP taking these medications   chlorthalidone 25 MG tablet Commonly known as: HYGROTON   colchicine 0.6 MG tablet   Gabapentin Enacarbil 600 MG Tbcr Commonly known as: Horizant   hydrochlorothiazide 12.5 MG tablet Commonly known as: HYDRODIURIL   ketorolac 10 MG tablet Commonly known as: TORADOL   meloxicam 15 MG tablet Commonly known as: MOBIC   multivitamin 3-35-2 MG Tabs tablet   nitroGLYCERIN 0.4 MG SL tablet Commonly known as: NITROSTAT   NONFORMULARY OR COMPOUNDED ITEM   omega-3 acid ethyl esters 1 g capsule Commonly known as: LOVAZA   tamsulosin 0.4 MG Caps capsule Commonly known as: FLOMAX     TAKE these medications   allopurinol 100 MG tablet Commonly known as: Zyloprim Take 2 tablets daily. What changed: Another medication with the same name was removed. Continue taking this medication, and follow the directions you see here.   ALPRAZolam 0.5 MG tablet Commonly known as: XANAX Take 0.5 mg by mouth 2 (two) times daily as needed for anxiety.   aspirin EC 81 MG tablet Take 1  tablet (81 mg total) by mouth daily. What changed:   medication strength  how much to take  when to take this  reasons to take this   cetirizine 10 MG tablet Commonly known as: ZYRTEC Take 10 mg by mouth daily as needed for allergies.   co-enzyme Q-10 50 MG capsule Take 50 mg by mouth daily.   diltiazem 120 MG 24 hr capsule Commonly known as: CARDIZEM CD Take 120 mg by mouth daily.   famotidine 40 MG tablet Commonly known as: PEPCID Take 40 mg by mouth daily.   ferrous sulfate 325 (65 FE) MG EC tablet Take 325 mg by mouth daily.   fluticasone 50 MCG/ACT nasal spray Commonly known as: FLONASE Place 2 sprays into the nose daily.   gabapentin 600 MG tablet Commonly known as: NEURONTIN Take 1 tablet (600 mg total) by mouth 3 (three) times daily. What changed: how much to take   HYDROcodone-acetaminophen 5-325 MG tablet Commonly known as: NORCO/VICODIN Take 1 tablet by mouth at bedtime as needed for moderate pain. What changed: Another medication with the same name was removed. Continue taking this medication, and follow the directions you see here.   Lantus SoloStar 100 UNIT/ML Solostar Pen Generic drug: Insulin Glargine Inject 85 Units into the skin 2 (two) times daily.   Metanx 3-90.314-2-35 MG Caps Take by mouth.   metFORMIN 500 MG 24 hr tablet Commonly known as: GLUCOPHAGE-XR   Neupro 8 MG/24HR Pt24 Generic drug: Rotigotine Place 1 patch onto the skin daily. What changed:   how much to take  when to take this   valsartan 320 MG tablet Commonly known as: DIOVAN Take 320 mg by mouth daily.   Victoza 18 MG/3ML Sopn Generic drug: liraglutide Inject 1.2 mg into the skin daily.      Follow-up Information    Stoneking, Hal, MD Follow up in 1 week(s).   Specialty: Internal Medicine Contact information: [address] [place] [address] [phone]            Time coordinating discharge: 35 min  Signed:  Geradine Girt DO  [facility] 04/27/2019, 1:30 PM

## 2019-04-27 NOTE — Plan of Care (Signed)

## 2019-04-30 IMAGING — CR DG ELBOW COMPLETE 3+V*L*
4 series · 4 of 4 positions shown · non-contrast
Comparison: None.

CLINICAL DATA: Left elbow pain after fall.

EXAM:
LEFT ELBOW - COMPLETE 3+ VIEW

[elbow ap]
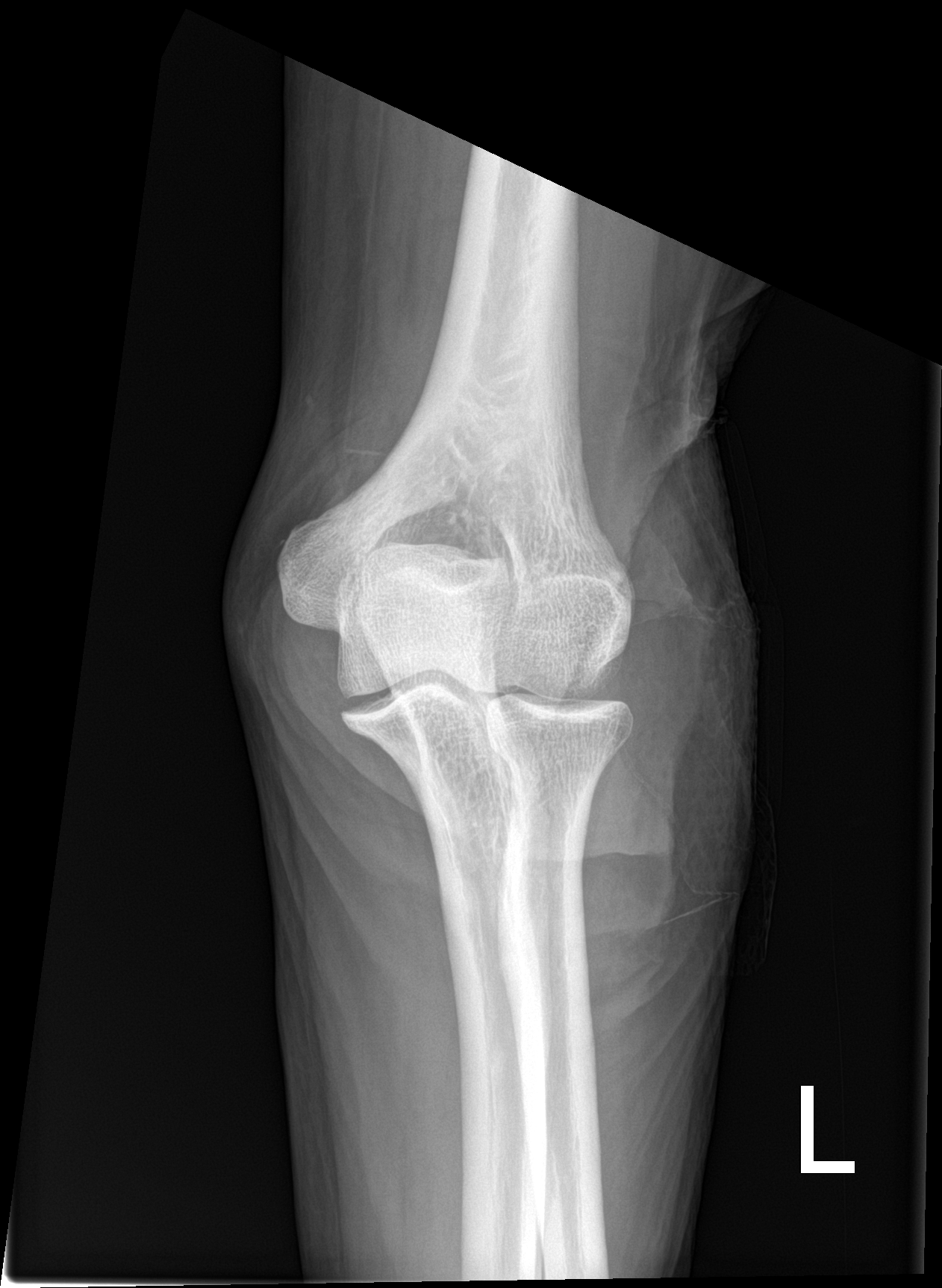

[elbow obl (1 of 2)]
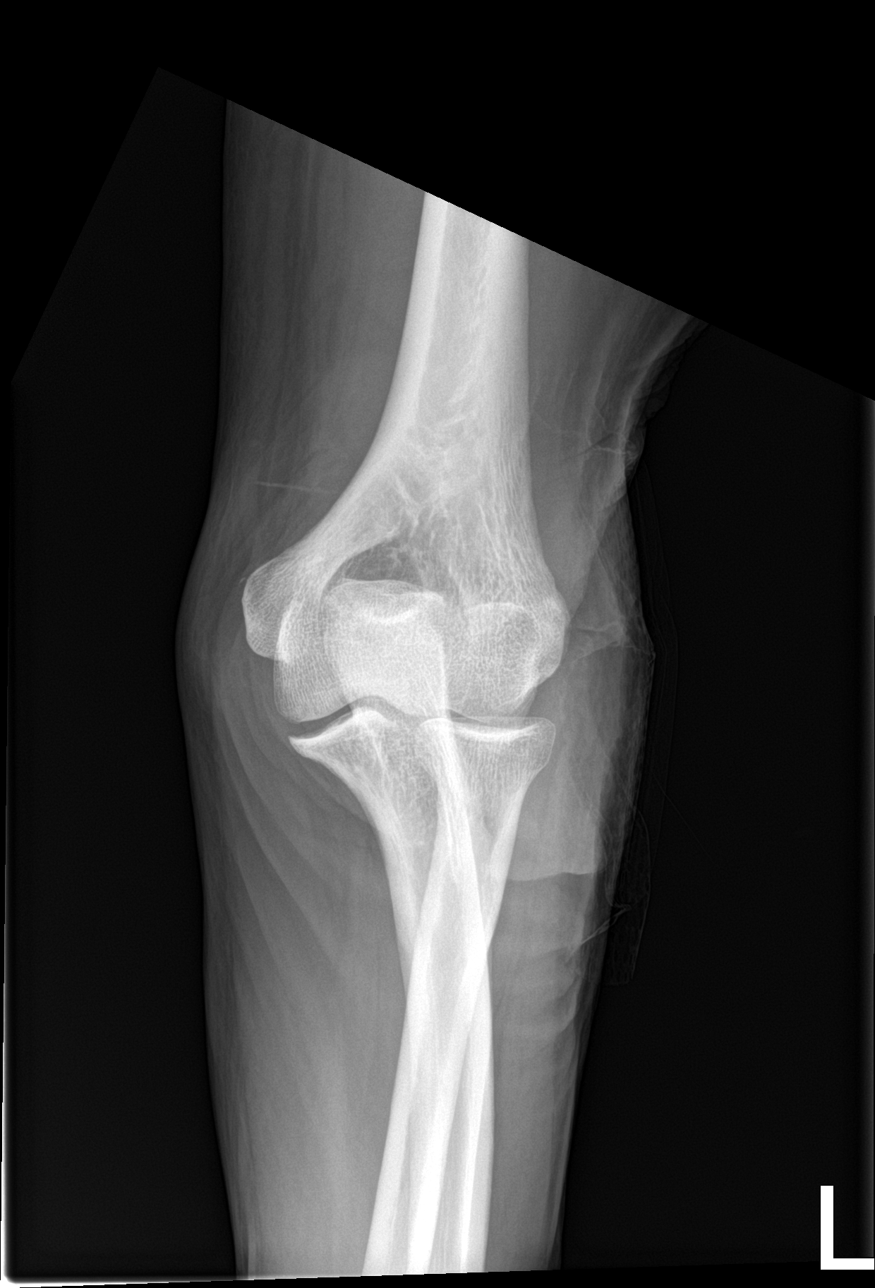

[elbow obl (2 of 2)]
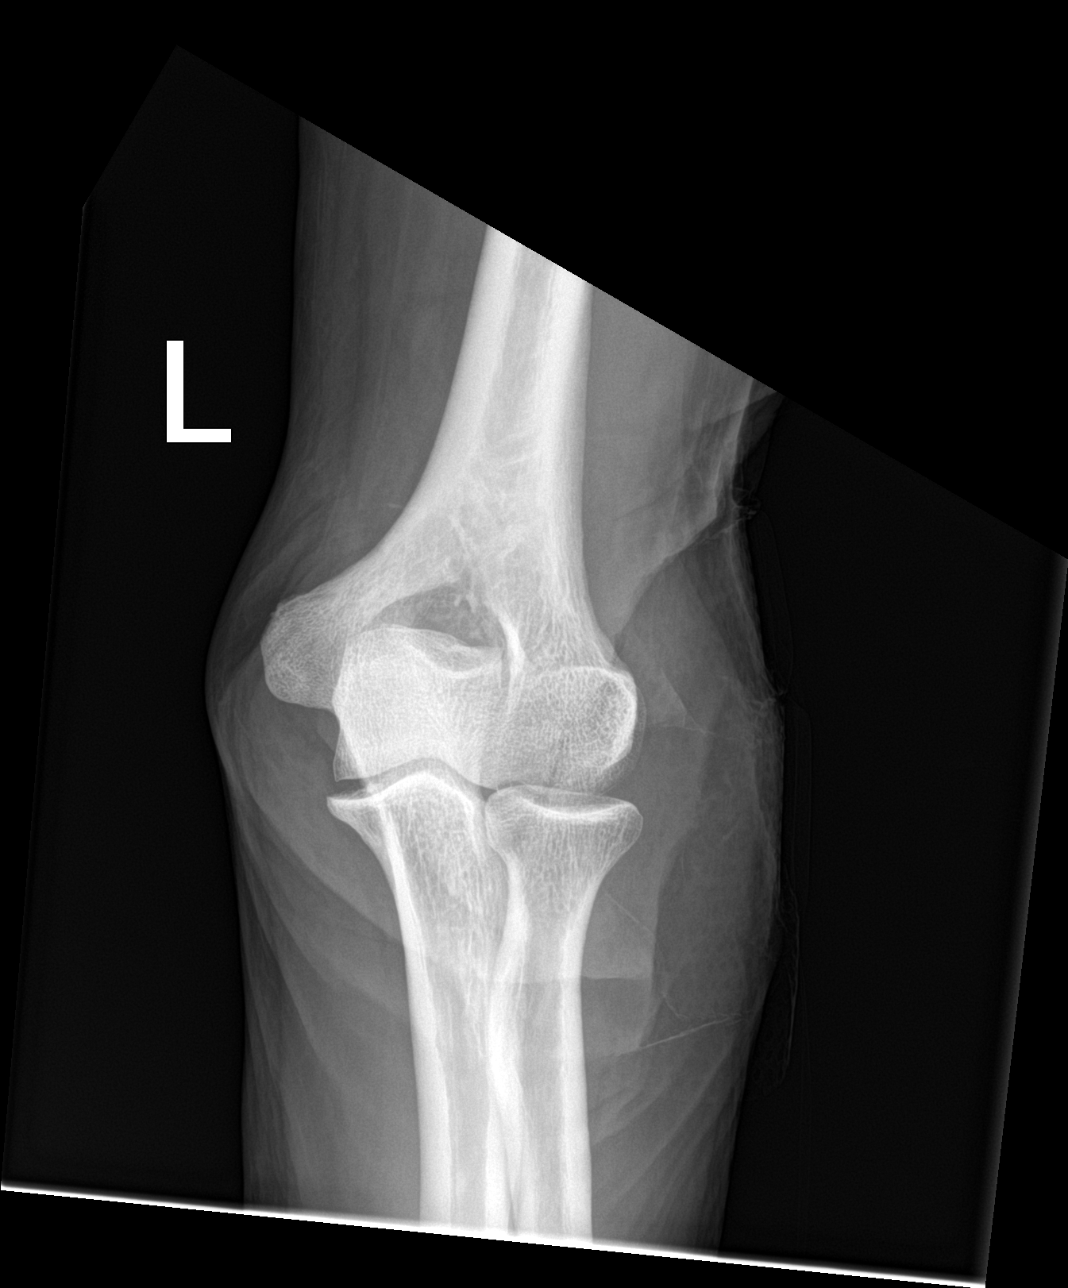

[elbow lat]
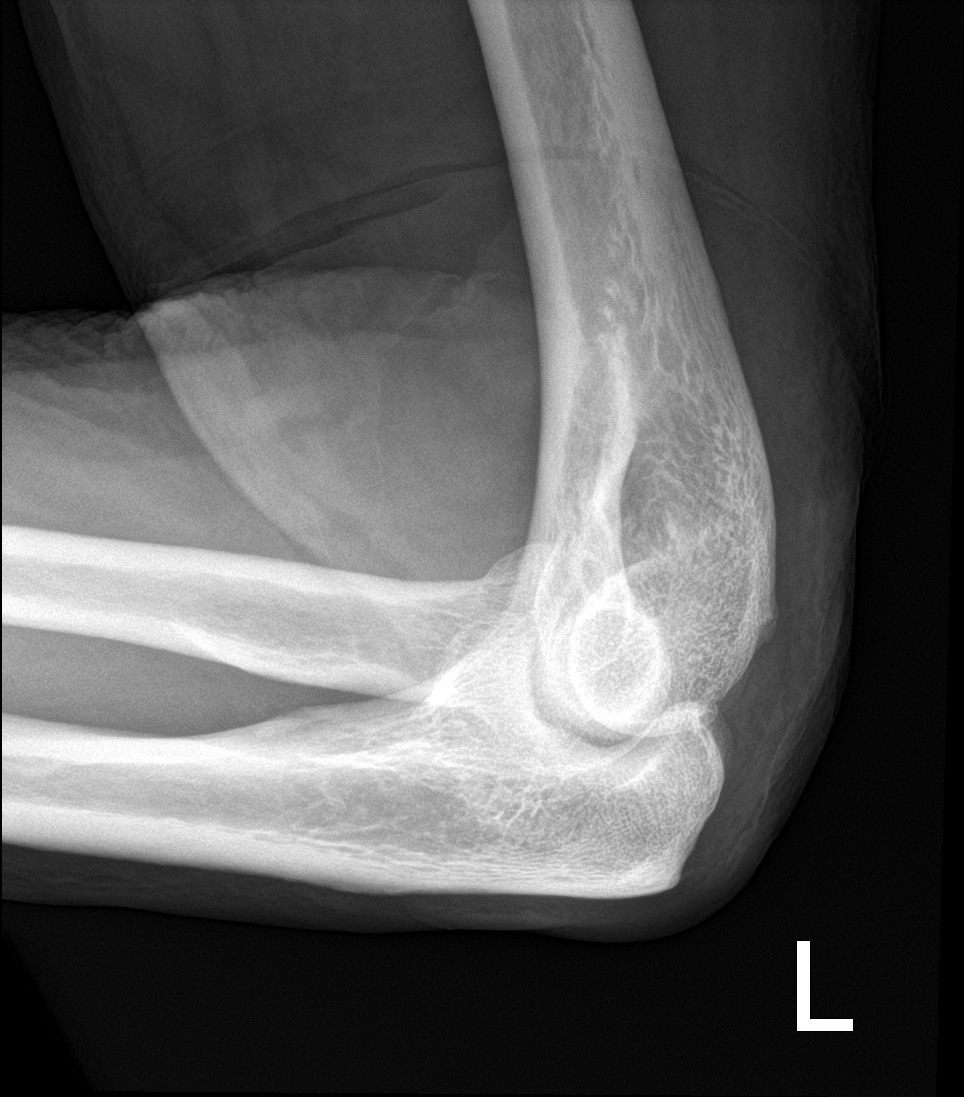

[4 of 4 positions shown; findings below may reference images not displayed]

FINDINGS: There is no evidence of fracture, dislocation, or joint effusion.
There is no evidence of arthropathy or other focal bone abnormality.
Soft tissues are unremarkable.
IMPRESSION: Negative.

## 2019-05-07 DIAGNOSIS — N1831 Chronic kidney disease, stage 3a: Secondary | ICD-10-CM | POA: Diagnosis not present

## 2019-05-07 DIAGNOSIS — E1121 Type 2 diabetes mellitus with diabetic nephropathy: Secondary | ICD-10-CM | POA: Diagnosis not present

## 2019-05-07 DIAGNOSIS — Z7984 Long term (current) use of oral hypoglycemic drugs: Secondary | ICD-10-CM | POA: Diagnosis not present

## 2019-05-07 DIAGNOSIS — I129 Hypertensive chronic kidney disease with stage 1 through stage 4 chronic kidney disease, or unspecified chronic kidney disease: Secondary | ICD-10-CM | POA: Diagnosis not present

## 2019-05-07 DIAGNOSIS — G2581 Restless legs syndrome: Secondary | ICD-10-CM | POA: Diagnosis not present

## 2019-05-13 DIAGNOSIS — E291 Testicular hypofunction: Secondary | ICD-10-CM | POA: Diagnosis not present

## 2019-05-17 ENCOUNTER — Other Ambulatory Visit: Payer: Self-pay

## 2019-05-17 ENCOUNTER — Encounter: Payer: Self-pay | Admitting: Podiatry

## 2019-05-17 ENCOUNTER — Ambulatory Visit (INDEPENDENT_AMBULATORY_CARE_PROVIDER_SITE_OTHER): Payer: Medicare Other | Admitting: Podiatry

## 2019-05-17 DIAGNOSIS — M79671 Pain in right foot: Secondary | ICD-10-CM | POA: Diagnosis not present

## 2019-05-17 DIAGNOSIS — R234 Changes in skin texture: Secondary | ICD-10-CM

## 2019-05-17 DIAGNOSIS — M79672 Pain in left foot: Secondary | ICD-10-CM

## 2019-05-17 NOTE — Patient Instructions (Addendum)
Apply Neosporin Ointment to both heels once daily. Cover left heel with 2-inch fabric band-aid.  Apply Aquaphor Ointment to both heels before bedtime.   Diabetes Mellitus and Foot Care Foot care is an important part of your health, especially when you have diabetes. Diabetes may cause you to have problems because of poor blood flow (circulation) to your feet and legs, which can cause your skin to:  Become thinner and drier.  Break more easily.  Heal more slowly.  Peel and crack. You may also have nerve damage (neuropathy) in your legs and feet, causing decreased feeling in them. This means that you may not notice minor injuries to your feet that could lead to more serious problems. Noticing and addressing any potential problems early is the best way to prevent future foot problems. How to care for your feet Foot hygiene  Wash your feet daily with warm water and mild soap. Do not use hot water. Then, pat your feet and the areas between your toes until they are completely dry. Do not soak your feet as this can dry your skin.  Trim your toenails straight across. Do not dig under them or around the cuticle. File the edges of your nails with an emery board or nail file.  Apply a moisturizing lotion or petroleum jelly to the skin on your feet and to dry, brittle toenails. Use lotion that does not contain alcohol and is unscented. Do not apply lotion between your toes. Shoes and socks  Wear clean socks or stockings every day. Make sure they are not too tight. Do not wear knee-high stockings since they may decrease blood flow to your legs.  Wear shoes that fit properly and have enough cushioning. Always look in your shoes before you put them on to be sure there are no objects inside.  To break in new shoes, wear them for just a few hours a day. This prevents injuries on your feet. Wounds, scrapes, corns, and calluses  Check your feet daily for blisters, cuts, bruises, sores, and redness. If  you cannot see the bottom of your feet, use a mirror or ask someone for help.  Do not cut corns or calluses or try to remove them with medicine.  If you find a minor scrape, cut, or break in the skin on your feet, keep it and the skin around it clean and dry. You may clean these areas with mild soap and water. Do not clean the area with peroxide, alcohol, or iodine.  If you have a wound, scrape, corn, or callus on your foot, look at it several times a day to make sure it is healing and not infected. Check for: ? Redness, swelling, or pain. ? Fluid or blood. ? Warmth. ? Pus or a bad smell. General instructions  Do not cross your legs. This may decrease blood flow to your feet.  Do not use heating pads or hot water bottles on your feet. They may burn your skin. If you have lost feeling in your feet or legs, you may not know this is happening until it is too late.  Protect your feet from hot and cold by wearing shoes, such as at the beach or on hot pavement.  Schedule a complete foot exam at least once a year (annually) or more often if you have foot problems. If you have foot problems, report any cuts, sores, or bruises to your health care provider immediately. Contact a health care provider if:  You have a medical  condition that increases your risk of infection and you have any cuts, sores, or bruises on your feet.  You have an injury that is not healing.  You have redness on your legs or feet.  You feel burning or tingling in your legs or feet.  You have pain or cramps in your legs and feet.  Your legs or feet are numb.  Your feet always feel cold.  You have pain around a toenail. Get help right away if:  You have a wound, scrape, corn, or callus on your foot and: ? You have pain, swelling, or redness that gets worse. ? You have fluid or blood coming from the wound, scrape, corn, or callus. ? Your wound, scrape, corn, or callus feels warm to the touch. ? You have pus or a  bad smell coming from the wound, scrape, corn, or callus. ? You have a fever. ? You have a red line going up your leg. Summary  Check your feet every day for cuts, sores, red spots, swelling, and blisters.  Moisturize feet and legs daily.  Wear shoes that fit properly and have enough cushioning.  If you have foot problems, report any cuts, sores, or bruises to your health care provider immediately.  Schedule a complete foot exam at least once a year (annually) or more often if you have foot problems. This information is not intended to replace advice given to you by your health care provider. Make sure you discuss any questions you have with your health care provider. Document Revised: 12/16/2018 Document Reviewed: 04/26/2016 Elsevier Patient Education  [place].

## 2019-05-18 DIAGNOSIS — N400 Enlarged prostate without lower urinary tract symptoms: Secondary | ICD-10-CM | POA: Diagnosis not present

## 2019-05-18 DIAGNOSIS — E291 Testicular hypofunction: Secondary | ICD-10-CM | POA: Diagnosis not present

## 2019-05-18 DIAGNOSIS — N390 Urinary tract infection, site not specified: Secondary | ICD-10-CM | POA: Diagnosis not present

## 2019-05-20 DIAGNOSIS — F419 Anxiety disorder, unspecified: Secondary | ICD-10-CM | POA: Diagnosis not present

## 2019-05-20 DIAGNOSIS — M545 Low back pain: Secondary | ICD-10-CM | POA: Diagnosis not present

## 2019-05-20 DIAGNOSIS — G20 Parkinson's disease: Secondary | ICD-10-CM | POA: Diagnosis not present

## 2019-05-20 DIAGNOSIS — G2581 Restless legs syndrome: Secondary | ICD-10-CM | POA: Diagnosis not present

## 2019-05-20 NOTE — Progress Notes (Signed)
Subjective: Jason Hartman presents today for follow up of follow up and relates new problem of left heel pain. Present for the past few weeks and is painful when pressure is applied.   Allergies  Allergen Reactions  . Atorvastatin Itching and Other (See Comments)    Tired, weakness   . Colesevelam Other (See Comments)    tired   . Iodinated Diagnostic Agents Hives       . Iohexol Hives    Code: HIVES, Desc: alleric to renografin,isovue & omnipaque, hives, requires 13 hr prep//a.calhoun, Onset Date: [id number]   . Lovastatin Other (See Comments)    Tired, nervousness   . Methadone Hcl Other (See Comments)    HALLUCINATIONS   . Morphine Sulfate Other (See Comments)    Pt not sure about allergy   . Other Hives  . Oxycodone Hcl Other (See Comments)    Pt not sure about allergy   . Penicillins Other (See Comments)    Pt not sure about allergy   . Promethazine Other (See Comments)    Mental status change, DELIRIUM    . Quetiapine Other (See Comments)    Pt not sure about allergy   . Rosuvastatin Other (See Comments)    Tired, weakness   . Zolpidem Other (See Comments)  . Carbidopa-Levodopa Anxiety  . Clindamycin Rash  . Doxazosin Rash  . Iodine Hives and Rash  . Linezolid Hives and Rash        Objective: There were no vitals filed for this visit.  Vascular Examination:  Capillary fill time to digits <3s b/l, palpable DP pulses b/l, palpable PT pulses b/l, pedal hair present b/l and skin temperature gradient within normal limits b/l  Dermatological Examination: Pedal skin with normal turgor, texture and tone bilaterally, no interdigital macerations bilaterally and Fissuring of skin noted b/l heels. Fissuring is longitudinal and located circumference of both heels and tender to palpation.  Musculoskeletal: Normal muscle strength 5/5 to all lower extremity muscle groups bilaterally, no gross bony deformities bilaterally and no pain crepitus or joint limitation noted  with ROM b/l  Neurological: Protective sensation absent with 10g monofilament b/l  Assessment: Fissure in skin of both feet  Pain in both feet  Plan: -Continue diabetic foot care principles. Literature dispensed on today.  -Patient to continue soft, supportive shoe gear daily. -Patient to report any pedal injuries to medical professional immediately. -Patient to apply Neosporin Cream to both heels once daily. -Patient to apply Aquaphor Ointment to both heels after Neosporin is applied once daily. -Patient/POA to call should there be question/concern in the interim.   Return in about 4 weeks (around 06/14/2019) for diabetic nail trim.

## 2019-05-26 DIAGNOSIS — C679 Malignant neoplasm of bladder, unspecified: Secondary | ICD-10-CM | POA: Diagnosis not present

## 2019-05-26 DIAGNOSIS — N3289 Other specified disorders of bladder: Secondary | ICD-10-CM | POA: Diagnosis not present

## 2019-05-26 DIAGNOSIS — N390 Urinary tract infection, site not specified: Secondary | ICD-10-CM | POA: Diagnosis not present

## 2019-05-26 DIAGNOSIS — E291 Testicular hypofunction: Secondary | ICD-10-CM | POA: Diagnosis not present

## 2019-05-26 DIAGNOSIS — C675 Malignant neoplasm of bladder neck: Secondary | ICD-10-CM | POA: Diagnosis not present

## 2019-05-26 DIAGNOSIS — N35819 Other urethral stricture, male, unspecified site: Secondary | ICD-10-CM | POA: Diagnosis not present

## 2019-06-11 DIAGNOSIS — E291 Testicular hypofunction: Secondary | ICD-10-CM | POA: Diagnosis not present

## 2019-06-15 ENCOUNTER — Ambulatory Visit (INDEPENDENT_AMBULATORY_CARE_PROVIDER_SITE_OTHER): Payer: Medicare Other | Admitting: Podiatry

## 2019-06-15 ENCOUNTER — Other Ambulatory Visit: Payer: Self-pay

## 2019-06-15 ENCOUNTER — Encounter: Payer: Self-pay | Admitting: Podiatry

## 2019-06-15 DIAGNOSIS — M79671 Pain in right foot: Secondary | ICD-10-CM

## 2019-06-15 DIAGNOSIS — M79672 Pain in left foot: Secondary | ICD-10-CM | POA: Diagnosis not present

## 2019-06-15 DIAGNOSIS — R234 Changes in skin texture: Secondary | ICD-10-CM | POA: Diagnosis not present

## 2019-06-15 NOTE — Patient Instructions (Signed)
Diabetes Mellitus and Foot Care Foot care is an important part of your health, especially when you have diabetes. Diabetes may cause you to have problems because of poor blood flow (circulation) to your feet and legs, which can cause your skin to:  Become thinner and drier.  Break more easily.  Heal more slowly.  Peel and crack. You may also have nerve damage (neuropathy) in your legs and feet, causing decreased feeling in them. This means that you may not notice minor injuries to your feet that could lead to more serious problems. Noticing and addressing any potential problems early is the best way to prevent future foot problems. How to care for your feet Foot hygiene  Wash your feet daily with warm water and mild soap. Do not use hot water. Then, pat your feet and the areas between your toes until they are completely dry. Do not soak your feet as this can dry your skin.  Trim your toenails straight across. Do not dig under them or around the cuticle. File the edges of your nails with an emery board or nail file.  Apply a moisturizing lotion or petroleum jelly to the skin on your feet and to dry, brittle toenails. Use lotion that does not contain alcohol and is unscented. Do not apply lotion between your toes. Shoes and socks  Wear clean socks or stockings every day. Make sure they are not too tight. Do not wear knee-high stockings since they may decrease blood flow to your legs.  Wear shoes that fit properly and have enough cushioning. Always look in your shoes before you put them on to be sure there are no objects inside.  To break in new shoes, wear them for just a few hours a day. This prevents injuries on your feet. Wounds, scrapes, corns, and calluses  Check your feet daily for blisters, cuts, bruises, sores, and redness. If you cannot see the bottom of your feet, use a mirror or ask someone for help.  Do not cut corns or calluses or try to remove them with medicine.  If you  find a minor scrape, cut, or break in the skin on your feet, keep it and the skin around it clean and dry. You may clean these areas with mild soap and water. Do not clean the area with peroxide, alcohol, or iodine.  If you have a wound, scrape, corn, or callus on your foot, look at it several times a day to make sure it is healing and not infected. Check for: ? Redness, swelling, or pain. ? Fluid or blood. ? Warmth. ? Pus or a bad smell. General instructions  Do not cross your legs. This may decrease blood flow to your feet.  Do not use heating pads or hot water bottles on your feet. They may burn your skin. If you have lost feeling in your feet or legs, you may not know this is happening until it is too late.  Protect your feet from hot and cold by wearing shoes, such as at the beach or on hot pavement.  Schedule a complete foot exam at least once a year (annually) or more often if you have foot problems. If you have foot problems, report any cuts, sores, or bruises to your health care provider immediately. Contact a health care provider if:  You have a medical condition that increases your risk of infection and you have any cuts, sores, or bruises on your feet.  You have an injury that is not   healing.  You have redness on your legs or feet.  You feel burning or tingling in your legs or feet.  You have pain or cramps in your legs and feet.  Your legs or feet are numb.  Your feet always feel cold.  You have pain around a toenail. Get help right away if:  You have a wound, scrape, corn, or callus on your foot and: ? You have pain, swelling, or redness that gets worse. ? You have fluid or blood coming from the wound, scrape, corn, or callus. ? Your wound, scrape, corn, or callus feels warm to the touch. ? You have pus or a bad smell coming from the wound, scrape, corn, or callus. ? You have a fever. ? You have a red line going up your leg. Summary  Check your feet every day  for cuts, sores, red spots, swelling, and blisters.  Moisturize feet and legs daily.  Wear shoes that fit properly and have enough cushioning.  If you have foot problems, report any cuts, sores, or bruises to your health care provider immediately.  Schedule a complete foot exam at least once a year (annually) or more often if you have foot problems. This information is not intended to replace advice given to you by your health care provider. Make sure you discuss any questions you have with your health care provider. Document Revised: 12/16/2018 Document Reviewed: 04/26/2016 Elsevier Patient Education  2020 [employer]  Corns and Calluses Corns are small areas of thickened skin that occur on the top, sides, or tip of a toe. They contain a cone-shaped core with a point that can press on a nerve below. This causes pain.  Calluses are areas of thickened skin that can occur anywhere on the body, including the hands, fingers, palms, soles of the feet, and heels. Calluses are usually larger than corns. What are the causes? Corns and calluses are caused by rubbing (friction) or pressure, such as from shoes that are too tight or do not fit properly. What increases the risk? Corns are more likely to develop in people who have misshapen toes (toe deformities), such as hammer toes. Calluses can occur with friction to any area of the skin. They are more likely to develop in people who:  Work with their hands.  Wear shoes that fit poorly, are too tight, or are high-heeled.  Have toe deformities. What are the signs or symptoms? Symptoms of a corn or callus include:  A hard growth on the skin.  Pain or tenderness under the skin.  Redness and swelling.  Increased discomfort while wearing tight-fitting shoes, if your feet are affected. If a corn or callus becomes infected, symptoms may include:  Redness and swelling that gets worse.  Pain.  Fluid, blood, or pus draining from the corn or  callus. How is this diagnosed? Corns and calluses may be diagnosed based on your symptoms, your medical history, and a physical exam. How is this treated? Treatment for corns and calluses may include:  Removing the cause of the friction or pressure. This may involve: ? Changing your shoes. ? Wearing shoe inserts (orthotics) or other protective layers in your shoes, such as a corn pad. ? Wearing gloves.  Applying medicine to the skin (topical medicine) to help soften skin in the hardened, thickened areas.  Removing layers of dead skin with a file to reduce the size of the corn or callus.  Removing the corn or callus with a scalpel or laser.  Taking   antibiotic medicines, if your corn or callus is infected.  Having surgery, if a toe deformity is the cause. Follow these instructions at home:   Take over-the-counter and prescription medicines only as told by your health care provider.  If you were prescribed an antibiotic, take it as told by your health care provider. Do not stop taking it even if your condition starts to improve.  Wear shoes that fit well. Avoid wearing high-heeled shoes and shoes that are too tight or too loose.  Wear any padding, protective layers, gloves, or orthotics as told by your health care provider.  Soak your hands or feet and then use a file or pumice stone to soften your corn or callus. Do this as told by your health care provider.  Check your corn or callus every day for symptoms of infection. Contact a health care provider if you:  Notice that your symptoms do not improve with treatment.  Have redness or swelling that gets worse.  Notice that your corn or callus becomes painful.  Have fluid, blood, or pus coming from your corn or callus.  Have new symptoms. Summary  Corns are small areas of thickened skin that occur on the top, sides, or tip of a toe.  Calluses are areas of thickened skin that can occur anywhere on the body, including the  hands, fingers, palms, and soles of the feet. Calluses are usually larger than corns.  Corns and calluses are caused by rubbing (friction) or pressure, such as from shoes that are too tight or do not fit properly.  Treatment may include wearing any padding, protective layers, gloves, or orthotics as told by your health care provider. This information is not intended to replace advice given to you by your health care provider. Make sure you discuss any questions you have with your health care provider. Document Revised: 07/15/2018 Document Reviewed: 02/05/2017 Elsevier Patient Education  2020 [employer]  

## 2019-06-16 DIAGNOSIS — D485 Neoplasm of uncertain behavior of skin: Secondary | ICD-10-CM | POA: Diagnosis not present

## 2019-06-16 DIAGNOSIS — L821 Other seborrheic keratosis: Secondary | ICD-10-CM | POA: Diagnosis not present

## 2019-06-16 DIAGNOSIS — Z85828 Personal history of other malignant neoplasm of skin: Secondary | ICD-10-CM | POA: Diagnosis not present

## 2019-06-16 DIAGNOSIS — D225 Melanocytic nevi of trunk: Secondary | ICD-10-CM | POA: Diagnosis not present

## 2019-06-16 DIAGNOSIS — L570 Actinic keratosis: Secondary | ICD-10-CM | POA: Diagnosis not present

## 2019-06-16 DIAGNOSIS — L814 Other melanin hyperpigmentation: Secondary | ICD-10-CM | POA: Diagnosis not present

## 2019-06-16 DIAGNOSIS — D1801 Hemangioma of skin and subcutaneous tissue: Secondary | ICD-10-CM | POA: Diagnosis not present

## 2019-06-21 NOTE — Progress Notes (Signed)
Subjective: Jason Hartman presents today for follow up of painful heel fissure b/l heels He relates his heel feels much better with application of Neosporin and Aquaphor Ointment. His wife is present during the visit. They voice no new pedal concerns on today's visit..   Allergies  Allergen Reactions  . Atorvastatin Itching and Other (See Comments)    Tired, weakness   . Colesevelam Other (See Comments)    tired   . Iodinated Diagnostic Agents Hives       . Iohexol Hives    Code: HIVES, Desc: alleric to renografin,isovue & omnipaque, hives, requires 13 hr prep//a.calhoun, Onset Date: [id number]   . Lovastatin Other (See Comments)    Tired, nervousness   . Methadone Hcl Other (See Comments)    HALLUCINATIONS   . Morphine Sulfate Other (See Comments)    Pt not sure about allergy   . Other Hives  . Oxycodone Hcl Other (See Comments)    Pt not sure about allergy   . Penicillins Other (See Comments)    Pt not sure about allergy   . Promethazine Other (See Comments)    Mental status change, DELIRIUM    . Quetiapine Other (See Comments)    Pt not sure about allergy   . Rosuvastatin Other (See Comments)    Tired, weakness   . Zolpidem Other (See Comments)  . Carbidopa-Levodopa Anxiety  . Clindamycin Rash  . Doxazosin Rash  . Iodine Hives and Rash  . Linezolid Hives and Rash          Objective: There were no vitals filed for this visit.  Pt 84 y.o. year old male  in NAD. AAO x 3.   Vascular Examination:  Capillary fill time to digits <3 seconds b/l. Palpable DP pulses b/l. Palpable PT pulses b/l. Pedal hair present b/l. Skin temperature gradient within normal limits b/l.  Dermatological Examination: Pedal skin with normal turgor, texture and tone bilaterally. No open wounds bilaterally. No interdigital macerations bilaterally. heel fissures b/l posterior heels resolved. No erythema, no tenderness to palpation, no edema, no flocculence.  Musculoskeletal: Normal muscle  strength 5/5 to all lower extremity muscle groups bilaterally, no gross bony deformities bilaterally and no pain crepitus or joint limitation noted with ROM b/l  Neurological: Protective sensation diminished with 10g monofilament b/l.  Assessment: 1. Fissure in skin of both feet   2. Pain in both feet    Plan: -Heel fissures resolved b/l. Continue Aquaphor Ointment to both heels daily to prevent heel fissures.. -Continue diabetic foot care principles. Literature dispensed on today.  -Patient to continue soft, supportive shoe gear daily. -Patient to report any pedal injuries to medical professional immediately. -Patient/POA to call should there be question/concern in the interim.  Return in about 5 weeks (around 07/20/2019) for diabetic nail and callus trim.

## 2019-07-16 DIAGNOSIS — C680 Malignant neoplasm of urethra: Secondary | ICD-10-CM | POA: Diagnosis not present

## 2019-07-16 DIAGNOSIS — N390 Urinary tract infection, site not specified: Secondary | ICD-10-CM | POA: Diagnosis not present

## 2019-07-16 DIAGNOSIS — R319 Hematuria, unspecified: Secondary | ICD-10-CM | POA: Diagnosis not present

## 2019-07-23 DIAGNOSIS — C680 Malignant neoplasm of urethra: Secondary | ICD-10-CM | POA: Diagnosis not present

## 2019-07-23 DIAGNOSIS — C675 Malignant neoplasm of bladder neck: Secondary | ICD-10-CM | POA: Diagnosis not present

## 2019-07-23 DIAGNOSIS — E291 Testicular hypofunction: Secondary | ICD-10-CM | POA: Diagnosis not present

## 2019-07-26 ENCOUNTER — Other Ambulatory Visit: Payer: Self-pay

## 2019-07-26 ENCOUNTER — Ambulatory Visit (INDEPENDENT_AMBULATORY_CARE_PROVIDER_SITE_OTHER): Payer: Medicare Other | Admitting: Podiatry

## 2019-07-26 VITALS — Temp 96.0°F

## 2019-07-26 DIAGNOSIS — L84 Corns and callosities: Secondary | ICD-10-CM

## 2019-07-26 DIAGNOSIS — B351 Tinea unguium: Secondary | ICD-10-CM | POA: Diagnosis not present

## 2019-07-26 DIAGNOSIS — M79674 Pain in right toe(s): Secondary | ICD-10-CM

## 2019-07-26 DIAGNOSIS — E1142 Type 2 diabetes mellitus with diabetic polyneuropathy: Secondary | ICD-10-CM | POA: Diagnosis not present

## 2019-07-26 DIAGNOSIS — M79675 Pain in left toe(s): Secondary | ICD-10-CM | POA: Diagnosis not present

## 2019-07-26 NOTE — Patient Instructions (Signed)
Diabetes Mellitus and Foot Care Foot care is an important part of your health, especially when you have diabetes. Diabetes may cause you to have problems because of poor blood flow (circulation) to your feet and legs, which can cause your skin to:  Become thinner and drier.  Break more easily.  Heal more slowly.  Peel and crack. You may also have nerve damage (neuropathy) in your legs and feet, causing decreased feeling in them. This means that you may not notice minor injuries to your feet that could lead to more serious problems. Noticing and addressing any potential problems early is the best way to prevent future foot problems. How to care for your feet Foot hygiene  Wash your feet daily with warm water and mild soap. Do not use hot water. Then, pat your feet and the areas between your toes until they are completely dry. Do not soak your feet as this can dry your skin.  Trim your toenails straight across. Do not dig under them or around the cuticle. File the edges of your nails with an emery board or nail file.  Apply a moisturizing lotion or petroleum jelly to the skin on your feet and to dry, brittle toenails. Use lotion that does not contain alcohol and is unscented. Do not apply lotion between your toes. Shoes and socks  Wear clean socks or stockings every day. Make sure they are not too tight. Do not wear knee-high stockings since they may decrease blood flow to your legs.  Wear shoes that fit properly and have enough cushioning. Always look in your shoes before you put them on to be sure there are no objects inside.  To break in new shoes, wear them for just a few hours a day. This prevents injuries on your feet. Wounds, scrapes, corns, and calluses  Check your feet daily for blisters, cuts, bruises, sores, and redness. If you cannot see the bottom of your feet, use a mirror or ask someone for help.  Do not cut corns or calluses or try to remove them with medicine.  If you  find a minor scrape, cut, or break in the skin on your feet, keep it and the skin around it clean and dry. You may clean these areas with mild soap and water. Do not clean the area with peroxide, alcohol, or iodine.  If you have a wound, scrape, corn, or callus on your foot, look at it several times a day to make sure it is healing and not infected. Check for: ? Redness, swelling, or pain. ? Fluid or blood. ? Warmth. ? Pus or a bad smell. General instructions  Do not cross your legs. This may decrease blood flow to your feet.  Do not use heating pads or hot water bottles on your feet. They may burn your skin. If you have lost feeling in your feet or legs, you may not know this is happening until it is too late.  Protect your feet from hot and cold by wearing shoes, such as at the beach or on hot pavement.  Schedule a complete foot exam at least once a year (annually) or more often if you have foot problems. If you have foot problems, report any cuts, sores, or bruises to your health care provider immediately. Contact a health care provider if:  You have a medical condition that increases your risk of infection and you have any cuts, sores, or bruises on your feet.  You have an injury that is not   healing.  You have redness on your legs or feet.  You feel burning or tingling in your legs or feet.  You have pain or cramps in your legs and feet.  Your legs or feet are numb.  Your feet always feel cold.  You have pain around a toenail. Get help right away if:  You have a wound, scrape, corn, or callus on your foot and: ? You have pain, swelling, or redness that gets worse. ? You have fluid or blood coming from the wound, scrape, corn, or callus. ? Your wound, scrape, corn, or callus feels warm to the touch. ? You have pus or a bad smell coming from the wound, scrape, corn, or callus. ? You have a fever. ? You have a red line going up your leg. Summary  Check your feet every day  for cuts, sores, red spots, swelling, and blisters.  Moisturize feet and legs daily.  Wear shoes that fit properly and have enough cushioning.  If you have foot problems, report any cuts, sores, or bruises to your health care provider immediately.  Schedule a complete foot exam at least once a year (annually) or more often if you have foot problems. This information is not intended to replace advice given to you by your health care provider. Make sure you discuss any questions you have with your health care provider. Document Revised: 12/16/2018 Document Reviewed: 04/26/2016 Elsevier Patient Education  2020 [employer]  Corns and Calluses Corns are small areas of thickened skin that occur on the top, sides, or tip of a toe. They contain a cone-shaped core with a point that can press on a nerve below. This causes pain.  Calluses are areas of thickened skin that can occur anywhere on the body, including the hands, fingers, palms, soles of the feet, and heels. Calluses are usually larger than corns. What are the causes? Corns and calluses are caused by rubbing (friction) or pressure, such as from shoes that are too tight or do not fit properly. What increases the risk? Corns are more likely to develop in people who have misshapen toes (toe deformities), such as hammer toes. Calluses can occur with friction to any area of the skin. They are more likely to develop in people who:  Work with their hands.  Wear shoes that fit poorly, are too tight, or are high-heeled.  Have toe deformities. What are the signs or symptoms? Symptoms of a corn or callus include:  A hard growth on the skin.  Pain or tenderness under the skin.  Redness and swelling.  Increased discomfort while wearing tight-fitting shoes, if your feet are affected. If a corn or callus becomes infected, symptoms may include:  Redness and swelling that gets worse.  Pain.  Fluid, blood, or pus draining from the corn or  callus. How is this diagnosed? Corns and calluses may be diagnosed based on your symptoms, your medical history, and a physical exam. How is this treated? Treatment for corns and calluses may include:  Removing the cause of the friction or pressure. This may involve: ? Changing your shoes. ? Wearing shoe inserts (orthotics) or other protective layers in your shoes, such as a corn pad. ? Wearing gloves.  Applying medicine to the skin (topical medicine) to help soften skin in the hardened, thickened areas.  Removing layers of dead skin with a file to reduce the size of the corn or callus.  Removing the corn or callus with a scalpel or laser.  Taking   antibiotic medicines, if your corn or callus is infected.  Having surgery, if a toe deformity is the cause. Follow these instructions at home:   Take over-the-counter and prescription medicines only as told by your health care provider.  If you were prescribed an antibiotic, take it as told by your health care provider. Do not stop taking it even if your condition starts to improve.  Wear shoes that fit well. Avoid wearing high-heeled shoes and shoes that are too tight or too loose.  Wear any padding, protective layers, gloves, or orthotics as told by your health care provider.  Soak your hands or feet and then use a file or pumice stone to soften your corn or callus. Do this as told by your health care provider.  Check your corn or callus every day for symptoms of infection. Contact a health care provider if you:  Notice that your symptoms do not improve with treatment.  Have redness or swelling that gets worse.  Notice that your corn or callus becomes painful.  Have fluid, blood, or pus coming from your corn or callus.  Have new symptoms. Summary  Corns are small areas of thickened skin that occur on the top, sides, or tip of a toe.  Calluses are areas of thickened skin that can occur anywhere on the body, including the  hands, fingers, palms, and soles of the feet. Calluses are usually larger than corns.  Corns and calluses are caused by rubbing (friction) or pressure, such as from shoes that are too tight or do not fit properly.  Treatment may include wearing any padding, protective layers, gloves, or orthotics as told by your health care provider. This information is not intended to replace advice given to you by your health care provider. Make sure you discuss any questions you have with your health care provider. Document Revised: 07/15/2018 Document Reviewed: 02/05/2017 Elsevier Patient Education  2020 [employer]  

## 2019-07-29 ENCOUNTER — Encounter: Payer: Self-pay | Admitting: Podiatry

## 2019-07-29 NOTE — Progress Notes (Signed)
Subjective: Jason Hartman presents today for follow up of at risk foot care with history of diabetic neuropathy and callus(es) b/l feet and painful mycotic toenails b/l that are difficult to trim. Pain interferes with ambulation. Aggravating factors include wearing enclosed shoe gear. Pain is relieved with periodic professional debridement.   His wife is present during the visit. Jason Hartman relates his left foot callus is tender on today's visit. He denies any redness, drainage or swelling from the callus. He also states his heels feel better and he continues to apply Aquaphor Ointment to them daily.   Allergies  Allergen Reactions  . Atorvastatin Itching and Other (See Comments)    Tired, weakness   . Colesevelam Other (See Comments)    tired   . Iodinated Diagnostic Agents Hives       . Iohexol Hives    Code: HIVES, Desc: alleric to renografin,isovue & omnipaque, hives, requires 13 hr prep//a.calhoun, Onset Date: [id number]   . Lovastatin Other (See Comments)    Tired, nervousness   . Methadone Hcl Other (See Comments)    HALLUCINATIONS   . Morphine Sulfate Other (See Comments)    Pt not sure about allergy   . Other Hives  . Oxycodone Hcl Other (See Comments)    Pt not sure about allergy   . Penicillins Other (See Comments)    Pt not sure about allergy   . Promethazine Other (See Comments)    Mental status change, DELIRIUM    . Quetiapine Other (See Comments)    Pt not sure about allergy   . Rosuvastatin Other (See Comments)    Tired, weakness   . Zolpidem Other (See Comments)  . Carbidopa-Levodopa Anxiety  . Clindamycin Rash  . Doxazosin Rash  . Iodine Hives and Rash  . Linezolid Hives and Rash          Objective: Vitals:   07/26/19 1323  Temp: (!) 72 F (35.6 C)    Pt is a pleasant 84 y.o. year old Caucasian male  in NAD. AAO x 3.   Vascular Examination:  Capillary refill time to digits immediate b/l. Palpable DP pulses b/l. Palpable PT pulses b/l. Pedal  hair present b/l. Skin temperature gradient within normal limits b/l.  Dermatological Examination: Pedal skin with normal turgor, texture and tone bilaterally. No open wounds bilaterally. No interdigital macerations bilaterally. Toenails 1-5 b/l elongated, dystrophic, thickened, crumbly with subungual debris and tenderness to dorsal palpation. Hyperkeratotic lesion(s) R hallux and submet head 5 left foot.  No erythema, no edema, no drainage, no flocculence.  Musculoskeletal: Normal muscle strength 5/5 to all lower extremity muscle groups bilaterally, no gross bony deformities bilaterally, no pain crepitus or joint limitation noted with ROM b/l and patient ambulates independent of any assistive aids  Neurological: Protective sensation diminished with 10g monofilament b/l. Vibratory sensation decreased b/l. Babinski reflex negative b/l. Clonus negative b/l.  Assessment: 1. Pain due to onychomycosis of toenails of both feet   2. Callus   3. Diabetic peripheral neuropathy associated with type 2 diabetes mellitus ([place])    Plan: -Continue diabetic foot care principles. Literature dispensed on today.  -Toenails 1-5 b/l were debrided in length and girth with sterile nail nippers and dremel without iatrogenic bleeding.  -Callus(es) R hallux and submet head 5 left foot were debrided without complication or incident. Total number debrided =2. -Patient to continue soft, supportive shoe gear daily. -Patient to report any pedal injuries to medical professional immediately. -Patient/POA to call should there be  question/concern in the interim.  Return in about 4 weeks (around 08/23/2019) for diabetic nail and callus trim.

## 2019-07-30 DIAGNOSIS — Z5111 Encounter for antineoplastic chemotherapy: Secondary | ICD-10-CM | POA: Diagnosis not present

## 2019-07-30 DIAGNOSIS — C675 Malignant neoplasm of bladder neck: Secondary | ICD-10-CM | POA: Diagnosis not present

## 2019-08-23 ENCOUNTER — Ambulatory Visit: Payer: Medicare Other | Admitting: Podiatry

## 2019-08-23 ENCOUNTER — Ambulatory Visit (INDEPENDENT_AMBULATORY_CARE_PROVIDER_SITE_OTHER): Payer: Medicare Other | Admitting: Podiatry

## 2019-08-23 ENCOUNTER — Other Ambulatory Visit: Payer: Self-pay

## 2019-08-23 DIAGNOSIS — M79675 Pain in left toe(s): Secondary | ICD-10-CM | POA: Diagnosis not present

## 2019-08-23 DIAGNOSIS — L84 Corns and callosities: Secondary | ICD-10-CM | POA: Diagnosis not present

## 2019-08-23 DIAGNOSIS — M79674 Pain in right toe(s): Secondary | ICD-10-CM

## 2019-08-23 DIAGNOSIS — B351 Tinea unguium: Secondary | ICD-10-CM | POA: Diagnosis not present

## 2019-08-23 DIAGNOSIS — E1142 Type 2 diabetes mellitus with diabetic polyneuropathy: Secondary | ICD-10-CM

## 2019-08-23 NOTE — Patient Instructions (Signed)
Diabetes Mellitus and Foot Care Foot care is an important part of your health, especially when you have diabetes. Diabetes may cause you to have problems because of poor blood flow (circulation) to your feet and legs, which can cause your skin to:  Become thinner and drier.  Break more easily.  Heal more slowly.  Peel and crack. You may also have nerve damage (neuropathy) in your legs and feet, causing decreased feeling in them. This means that you may not notice minor injuries to your feet that could lead to more serious problems. Noticing and addressing any potential problems early is the best way to prevent future foot problems. How to care for your feet Foot hygiene  Wash your feet daily with warm water and mild soap. Do not use hot water. Then, pat your feet and the areas between your toes until they are completely dry. Do not soak your feet as this can dry your skin.  Trim your toenails straight across. Do not dig under them or around the cuticle. File the edges of your nails with an emery board or nail file.  Apply a moisturizing lotion or petroleum jelly to the skin on your feet and to dry, brittle toenails. Use lotion that does not contain alcohol and is unscented. Do not apply lotion between your toes. Shoes and socks  Wear clean socks or stockings every day. Make sure they are not too tight. Do not wear knee-high stockings since they may decrease blood flow to your legs.  Wear shoes that fit properly and have enough cushioning. Always look in your shoes before you put them on to be sure there are no objects inside.  To break in new shoes, wear them for just a few hours a day. This prevents injuries on your feet. Wounds, scrapes, corns, and calluses  Check your feet daily for blisters, cuts, bruises, sores, and redness. If you cannot see the bottom of your feet, use a mirror or ask someone for help.  Do not cut corns or calluses or try to remove them with medicine.  If you  find a minor scrape, cut, or break in the skin on your feet, keep it and the skin around it clean and dry. You may clean these areas with mild soap and water. Do not clean the area with peroxide, alcohol, or iodine.  If you have a wound, scrape, corn, or callus on your foot, look at it several times a day to make sure it is healing and not infected. Check for: ? Redness, swelling, or pain. ? Fluid or blood. ? Warmth. ? Pus or a bad smell. General instructions  Do not cross your legs. This may decrease blood flow to your feet.  Do not use heating pads or hot water bottles on your feet. They may burn your skin. If you have lost feeling in your feet or legs, you may not know this is happening until it is too late.  Protect your feet from hot and cold by wearing shoes, such as at the beach or on hot pavement.  Schedule a complete foot exam at least once a year (annually) or more often if you have foot problems. If you have foot problems, report any cuts, sores, or bruises to your health care provider immediately. Contact a health care provider if:  You have a medical condition that increases your risk of infection and you have any cuts, sores, or bruises on your feet.  You have an injury that is not   healing.  You have redness on your legs or feet.  You feel burning or tingling in your legs or feet.  You have pain or cramps in your legs and feet.  Your legs or feet are numb.  Your feet always feel cold.  You have pain around a toenail. Get help right away if:  You have a wound, scrape, corn, or callus on your foot and: ? You have pain, swelling, or redness that gets worse. ? You have fluid or blood coming from the wound, scrape, corn, or callus. ? Your wound, scrape, corn, or callus feels warm to the touch. ? You have pus or a bad smell coming from the wound, scrape, corn, or callus. ? You have a fever. ? You have a red line going up your leg. Summary  Check your feet every day  for cuts, sores, red spots, swelling, and blisters.  Moisturize feet and legs daily.  Wear shoes that fit properly and have enough cushioning.  If you have foot problems, report any cuts, sores, or bruises to your health care provider immediately.  Schedule a complete foot exam at least once a year (annually) or more often if you have foot problems. This information is not intended to replace advice given to you by your health care provider. Make sure you discuss any questions you have with your health care provider. Document Revised: 12/16/2018 Document Reviewed: 04/26/2016 Elsevier Patient Education  2020 [employer]  

## 2019-08-29 ENCOUNTER — Encounter: Payer: Self-pay | Admitting: Podiatry

## 2019-08-29 NOTE — Progress Notes (Signed)
Subjective: Jason Hartman presents today at risk foot care with history of diabetic neuropathy and callus(es) left foot and right hallux and painful mycotic toenails b/l that are difficult to trim. Pain interferes with ambulation. Aggravating factors include wearing enclosed shoe gear. Pain is relieved with periodic professional debridement.   He continues to use Aquaphor Ointment to heels daily.  Jason Manes, MD is patient's PCP. Last visit was: 07/30/2019.  Past Medical History:  Diagnosis Date  . Cellulitis and abscess of leg, except foot   . Hypertension   . Obstructive sleep apnea (adult) (pediatric)   . Pain in joint, pelvic region and thigh   . Pneumonia, organism unspecified(486)   . Restless legs syndrome (RLS)   . RLS (restless legs syndrome) 09/01/2012  . Thoracic or lumbosacral neuritis or radiculitis, unspecified   . Type II or unspecified type diabetes mellitus without mention of complication, not stated as uncontrolled   . Unspecified disease of pericardium   . Unspecified hereditary and idiopathic peripheral neuropathy      Current Outpatient Medications on File Prior to Visit  Medication Sig Dispense Refill  . allopurinol (ZYLOPRIM) 100 MG tablet Take 2 tablets daily. (Patient not taking: Reported on 04/26/2019) 60 tablet 0  . ALPRAZolam (XANAX) 0.5 MG tablet Take 0.5 mg by mouth 2 (two) times daily as needed for anxiety.     Marland Kitchen aspirin EC 81 MG tablet Take 1 tablet (81 mg total) by mouth daily. 150 tablet 2  . cetirizine (ZYRTEC) 10 MG tablet Take 10 mg by mouth daily as needed for allergies.     . chlorthalidone (HYGROTON) 25 MG tablet TAKE 1/2 TABLET BY MOUTH EVERY DAY IN THE MORNING WITH FOOD    . ciprofloxacin (CIPRO) 500 MG tablet Take 500 mg by mouth 2 (two) times daily.    Marland Kitchen co-enzyme Q-10 50 MG capsule Take 50 mg by mouth daily.    Marland Kitchen diltiazem (CARDIZEM CD) 120 MG 24 hr capsule Take 120 mg by mouth daily.     . famotidine (PEPCID) 40 MG tablet Take 40 mg by  mouth daily.     . ferrous sulfate 325 (65 FE) MG EC tablet Take 325 mg by mouth daily.     . fluconazole (DIFLUCAN) 200 MG tablet     . fluticasone (FLONASE) 50 MCG/ACT nasal spray Place 2 sprays into the nose daily.     Marland Kitchen gabapentin (NEURONTIN) 600 MG tablet Take 1 tablet (600 mg total) by mouth 3 (three) times daily.    Marland Kitchen HYDROcodone-acetaminophen (NORCO/VICODIN) 5-325 MG per tablet Take 1 tablet by mouth at bedtime as needed for moderate pain.     Marland Kitchen L-Methylfolate-Algae-B12-B6 (METANX) 3-90.314-2-35 MG CAPS Take by mouth.    Marland Kitchen LANTUS SOLOSTAR 100 UNIT/ML SOPN Inject 85 Units into the skin 2 (two) times daily.     Marland Kitchen liraglutide (VICTOZA) 18 MG/3ML SOPN Inject 1.2 mg into the skin daily.     . magnesium citrate SOLN Take by mouth.    . metFORMIN (GLUCOPHAGE-XR) 500 MG 24 hr tablet     . NEUPRO 8 MG/24HR PT24 Place 1 patch onto the skin daily. 30 patch   . ofloxacin (OCUFLOX) 0.3 % ophthalmic solution INSTILL 1 TO 2 DROPS IN LEFT EYE FOUR TIMES DAILY FOR ACUTE OPIOID THERAPY DAYS    . Omega-3 Fatty Acids (FISH OIL) 1000 MG CAPS Take by mouth.    . polyethylene glycol powder (GLYCOLAX/MIRALAX) 17 GM/SCOOP powder Take by mouth.    . valsartan (  DIOVAN) 320 MG tablet Take 320 mg by mouth daily.     No current facility-administered medications on file prior to visit.     Allergies  Allergen Reactions  . Atorvastatin Itching and Other (See Comments)    Tired, weakness   . Colesevelam Other (See Comments)    tired   . Iodinated Diagnostic Agents Hives       . Iohexol Hives    Code: HIVES, Desc: alleric to renografin,isovue & omnipaque, hives, requires 13 hr prep//a.calhoun, Onset Date: [id number]   . Lovastatin Other (See Comments)    Tired, nervousness   . Methadone Hcl Other (See Comments)    HALLUCINATIONS   . Morphine Sulfate Other (See Comments)    Pt not sure about allergy   . Other Hives  . Oxycodone Hcl Other (See Comments)    Pt not sure about allergy   . Penicillins  Other (See Comments)    Pt not sure about allergy   . Promethazine Other (See Comments)    Mental status change, DELIRIUM    . Quetiapine Other (See Comments)    Pt not sure about allergy   . Rosuvastatin Other (See Comments)    Tired, weakness   . Zolpidem Other (See Comments)  . Carbidopa-Levodopa Anxiety  . Clindamycin Rash  . Doxazosin Rash  . Iodine Hives and Rash  . Linezolid Hives and Rash         Objective: Jason Hartman is a pleasant 84 y.o. y.o. Patient Race: White or Caucasian [1]  male in NAD. AAO x 3.  There were no vitals filed for this visit.  Vascular Examination: Neurovascular status unchanged b/l. Capillary refill time to digits immediate b/l. Palpable DP pulses b/l. Palpable PT pulses b/l. Pedal hair present b/l. Skin temperature gradient within normal limits b/l. No edema noted b/l.  Dermatological Examination: Pedal skin with normal turgor, texture and tone bilaterally. No open wounds bilaterally. No interdigital macerations bilaterally. Toenails 1-5 b/l elongated, dystrophic, thickened, crumbly with subungual debris and tenderness to dorsal palpation. Hyperkeratotic lesion(s) R hallux and submet head 5 left foot.  No erythema, no edema, no drainage, no flocculence.  Musculoskeletal: Normal muscle strength 5/5 to all lower extremity muscle groups bilaterally. No gross bony deformities bilaterally. No pain crepitus or joint limitation noted with ROM b/l. Patient ambulates independent of any assistive aids.  Neurological Examination: Protective sensation diminished with 10g monofilament b/l. Vibratory sensation decreased b/l.  Assessment: 1. Pain due to onychomycosis of toenails of both feet   2. Callus   3. Diabetic peripheral neuropathy associated with type 2 diabetes mellitus (Jason Hartman)    Plan: -Examined patient. -No new findings. No new orders. -Toenails 1-5 b/l were debrided in length and girth with sterile nail nippers and dremel without iatrogenic  bleeding.  -Callus(es) R hallux and submet head 5 left foot pared utilizing sterile scalpel blade without complication or incident. Total number debrided =2. -Continue Aquaphor Ointment to heels daily. -Patient to continue soft, supportive shoe gear daily. -Patient to report any pedal injuries to medical professional immediately. -Patient/POA to call should there be question/concern in the interim.  -Next visit will be Medicare covered visit.  Return in about 5 weeks (around 09/27/2019) for diabetic nail and callus trim.  Marzetta Board, DPM

## 2019-09-27 ENCOUNTER — Ambulatory Visit: Payer: Medicare Other | Admitting: Podiatry

## 2019-10-04 ENCOUNTER — Ambulatory Visit: Payer: Medicare Other | Admitting: Podiatry

## 2019-10-15 ENCOUNTER — Ambulatory Visit (INDEPENDENT_AMBULATORY_CARE_PROVIDER_SITE_OTHER): Payer: Medicare Other | Admitting: Podiatry

## 2019-10-15 ENCOUNTER — Encounter: Payer: Self-pay | Admitting: Podiatry

## 2019-10-15 ENCOUNTER — Other Ambulatory Visit: Payer: Self-pay

## 2019-10-15 VITALS — Temp 96.7°F

## 2019-10-15 DIAGNOSIS — L84 Corns and callosities: Secondary | ICD-10-CM

## 2019-10-15 DIAGNOSIS — M79674 Pain in right toe(s): Secondary | ICD-10-CM

## 2019-10-15 DIAGNOSIS — E1149 Type 2 diabetes mellitus with other diabetic neurological complication: Secondary | ICD-10-CM

## 2019-10-15 DIAGNOSIS — B351 Tinea unguium: Secondary | ICD-10-CM

## 2019-10-15 DIAGNOSIS — M79675 Pain in left toe(s): Secondary | ICD-10-CM

## 2019-10-19 NOTE — Progress Notes (Signed)
Subjective: Jason Hartman presents today at risk foot care with history of diabetic neuropathy and painful callus(es) right hallux and left foot and painful thick toenails that are difficult to trim. Pain interferes with ambulation. Aggravating factors include wearing enclosed shoe gear. Pain is relieved with periodic professional debridement.   Neuropathy pain is managed with gabapentin.  His wife is present during today's visit. She has recuperated from her surgery and is getting stronger.   Jason Hartman states his left foot plantar callus is tender on today. He voices no new pedal problems on today's visit.  Jason Manes, MD is patient's PCP. Last visit was: 07/30/2019.  Past Medical History:  Diagnosis Date  . Cellulitis and abscess of leg, except foot   . Hypertension   . Obstructive sleep apnea (adult) (pediatric)   . Pain in joint, pelvic region and thigh   . Pneumonia, organism unspecified(486)   . Restless legs syndrome (RLS)   . RLS (restless legs syndrome) 09/01/2012  . Thoracic or lumbosacral neuritis or radiculitis, unspecified   . Type II or unspecified type diabetes mellitus without mention of complication, not stated as uncontrolled   . Unspecified disease of pericardium   . Unspecified hereditary and idiopathic peripheral neuropathy      Patient Active Problem List   Diagnosis Date Noted  . Hypertensive urgency 04/26/2019  . Malignant HTN with heart disease, w/o CHF, w/o chronic kidney disease 04/26/2019  . Elevated troponin   . LFTs abnormal   . Penis pain 04/10/2018  . Urethra cancer ([address]) 02/25/2018  . Lower extremity edema 06/09/2017  . Peptic ulcer disease 05/16/2017  . GERD (gastroesophageal reflux disease) 05/16/2017  . Chronic kidney disease, stage 3 04/11/2017  . Lower urinary tract symptoms (LUTS) 04/15/2016  . Narcotic dependence ([place]) 08/31/2015  . Imbalance 08/31/2015  . Diabetic peripheral neuropathy ([place]) 08/31/2015  . Other malaise and fatigue  05/02/2015  . Chronic fatigue 05/02/2015  . Panic disorder without agoraphobia 03/22/2015  . Hyperlipidemia 02/03/2014  . Thrombocytopenia ([place]) 08/18/2013  . Parkinson disease ([place]) 08/18/2013  . Other disorders of lung 08/18/2013  . Organic impotence 08/18/2013  . Memory loss 08/18/2013  . Low back pain 08/18/2013  . Iron deficiency anemia 08/18/2013  . Hypercalcemia 08/18/2013  . Cellulitis of right leg 08/18/2013  . Atherosclerotic heart disease of native coronary artery without angina pectoris 08/18/2013  . RLS (restless legs syndrome) 09/01/2012  . HERPES SIMPLEX INFECTION 11/06/2006  . DIABETES MELLITUS, TYPE II 11/06/2006  . HYPOGONADISM 11/06/2006  . VITAMIN D DEFICIENCY 11/06/2006  . ANXIETY 11/06/2006  . OBSTRUCTIVE SLEEP APNEA 11/06/2006  . RESTLESS LEG SYNDROME 11/06/2006  . HYPERTENSION 11/06/2006  . BENIGN PROSTATIC HYPERTROPHY 11/06/2006  . ROSACEA 11/06/2006  . OSTEOARTHRITIS 11/06/2006  . INSOMNIA 11/06/2006  . HYPERGLYCEMIA 11/06/2006  . COLONIC POLYPS, HX OF 11/06/2006    Current Outpatient Medications on File Prior to Visit  Medication Sig Dispense Refill  . allopurinol (ZYLOPRIM) 100 MG tablet Take 2 tablets daily. 60 tablet 0  . ALPRAZolam (XANAX) 0.5 MG tablet Take 0.5 mg by mouth 2 (two) times daily as needed for anxiety.     Marland Kitchen aspirin EC 81 MG tablet Take 1 tablet (81 mg total) by mouth daily. 150 tablet 2  . cetirizine (ZYRTEC) 10 MG tablet Take 10 mg by mouth daily as needed for allergies.     . chlorthalidone (HYGROTON) 25 MG tablet TAKE 1/2 TABLET BY MOUTH EVERY DAY IN THE MORNING WITH FOOD    .  co-enzyme Q-10 50 MG capsule Take 50 mg by mouth daily.    Marland Kitchen diltiazem (CARDIZEM CD) 120 MG 24 hr capsule Take 120 mg by mouth daily.     . famotidine (PEPCID) 40 MG tablet Take 40 mg by mouth daily.     . ferrous sulfate 325 (65 FE) MG EC tablet Take 325 mg by mouth daily.     . fluconazole (DIFLUCAN) 200 MG tablet     . fluticasone (FLONASE) 50  MCG/ACT nasal spray Place 2 sprays into the nose daily.     Marland Kitchen gabapentin (NEURONTIN) 600 MG tablet Take 1 tablet (600 mg total) by mouth 3 (three) times daily.    Marland Kitchen HYDROcodone-acetaminophen (NORCO/VICODIN) 5-325 MG per tablet Take 1 tablet by mouth at bedtime as needed for moderate pain.     Marland Kitchen L-Methylfolate-Algae-B12-B6 (METANX) 3-90.314-2-35 MG CAPS Take by mouth.    Marland Kitchen LANTUS SOLOSTAR 100 UNIT/ML SOPN Inject 85 Units into the skin 2 (two) times daily.     . magnesium citrate SOLN Take by mouth.    . metFORMIN (GLUCOPHAGE-XR) 500 MG 24 hr tablet     . NEUPRO 8 MG/24HR PT24 Place 1 patch onto the skin daily. 30 patch   . ofloxacin (OCUFLOX) 0.3 % ophthalmic solution INSTILL 1 TO 2 DROPS IN LEFT EYE FOUR TIMES DAILY FOR ACUTE OPIOID THERAPY DAYS    . Omega-3 Fatty Acids (FISH OIL) 1000 MG CAPS Take by mouth.    . polyethylene glycol powder (GLYCOLAX/MIRALAX) 17 GM/SCOOP powder Take by mouth.    . valsartan (DIOVAN) 320 MG tablet Take 320 mg by mouth daily.    . ciprofloxacin (CIPRO) 500 MG tablet Take 500 mg by mouth 2 (two) times daily.    Marland Kitchen liraglutide (VICTOZA) 18 MG/3ML SOPN Inject 1.2 mg into the skin daily.      No current facility-administered medications on file prior to visit.     Allergies  Allergen Reactions  . Atorvastatin Itching and Other (See Comments)    Tired, weakness   . Colesevelam Other (See Comments)    tired   . Iodinated Diagnostic Agents Hives       . Iohexol Hives    Code: HIVES, Desc: alleric to renografin,isovue & omnipaque, hives, requires 13 hr prep//a.calhoun, Onset Date: [id number]   . Lovastatin Other (See Comments)    Tired, nervousness   . Methadone Hcl Other (See Comments)    HALLUCINATIONS   . Morphine Sulfate Other (See Comments)    Pt not sure about allergy   . Other Hives  . Oxycodone Hcl Other (See Comments)    Pt not sure about allergy   . Penicillins Other (See Comments)    Pt not sure about allergy   . Promethazine Other (See  Comments)    Mental status change, DELIRIUM    . Quetiapine Other (See Comments)    Pt not sure about allergy   . Rosuvastatin Other (See Comments)    Tired, weakness   . Zolpidem Other (See Comments)  . Carbidopa-Levodopa Anxiety  . Clindamycin Rash  . Doxazosin Rash  . Iodine Hives and Rash  . Linezolid Hives and Rash         Objective: Jason Hartman is a pleasant 84 y.o. Caucasian male obese in NAD. AAO x 3.  Vitals:   10/15/19 0923  Temp: (!) 96.7 F (35.9 C)    Vascular Examination: Capillary refill time to digits immediate b/l. Palpable pedal pulses b/l LE. Pedal hair present.  Lower extremity skin temperature gradient within normal limits. No pain with calf compression b/l. No edema noted b/l lower extremities.  Dermatological Examination: Pedal skin with normal turgor, texture and tone bilaterally. No open wounds bilaterally. No interdigital macerations bilaterally. Toenails 1-5 b/l elongated, discolored, dystrophic, thickened, crumbly with subungual debris and tenderness to dorsal palpation. Hyperkeratotic lesion(s) R hallux and submet head 5 left foot.  No erythema, no edema, no drainage, no flocculence.  Musculoskeletal: Normal muscle strength 5/5 to all lower extremity muscle groups bilaterally. No pain crepitus or joint limitation noted with ROM b/l. No gross bony deformities bilaterally. Uses walking stick for ambulation assistance.  Neurological Examination: Pt has subjective symptoms of neuropathy. Protective sensation diminished with 10g monofilament b/l. Vibratory sensation decreased b/l. Clonus negative b/l.  Last A1c: Hemoglobin A1C Latest Ref Rng & Units 04/26/2019  HGBA1C 4.8 - 5.6 % 9.1(H)  Some recent data might be hidden     Assessment: 1. Pain due to onychomycosis of toenails of both feet   2. Callus   3. Type II diabetes mellitus with neurological manifestations ([place])    Plan: -Examined patient. -No new findings. No new orders. -Continue  diabetic foot care principles. -Toenails 1-5 b/l were debrided in length and girth with sterile nail nippers and dremel without iatrogenic bleeding.  -Callus(es) R hallux and submet head 5 left foot pared utilizing sterile scalpel blade without complication or incident. Total number debrided =2. -Patient to report any pedal injuries to medical professional immediately. -Patient to continue soft, supportive shoe gear daily. -Patient/POA to call should there be question/concern in the interim.  Return in about 5 weeks (around 11/19/2019) for for non-Medicare covered self-pay visit.  Marzetta Board, DPM

## 2019-10-27 DIAGNOSIS — N401 Enlarged prostate with lower urinary tract symptoms: Secondary | ICD-10-CM | POA: Diagnosis not present

## 2019-10-27 DIAGNOSIS — N183 Chronic kidney disease, stage 3 unspecified: Secondary | ICD-10-CM | POA: Diagnosis not present

## 2019-10-27 DIAGNOSIS — E1121 Type 2 diabetes mellitus with diabetic nephropathy: Secondary | ICD-10-CM | POA: Diagnosis not present

## 2019-10-27 DIAGNOSIS — I129 Hypertensive chronic kidney disease with stage 1 through stage 4 chronic kidney disease, or unspecified chronic kidney disease: Secondary | ICD-10-CM | POA: Diagnosis not present

## 2019-11-03 DIAGNOSIS — E291 Testicular hypofunction: Secondary | ICD-10-CM | POA: Diagnosis not present

## 2019-11-08 DIAGNOSIS — R338 Other retention of urine: Secondary | ICD-10-CM | POA: Diagnosis not present

## 2019-11-11 ENCOUNTER — Other Ambulatory Visit: Payer: Self-pay | Admitting: Geriatric Medicine

## 2019-11-11 DIAGNOSIS — I77810 Thoracic aortic ectasia: Secondary | ICD-10-CM

## 2019-11-17 ENCOUNTER — Telehealth: Payer: Self-pay

## 2019-11-17 NOTE — Telephone Encounter (Signed)
13 hour prep called in to pt's [facility] in [place] and lmom for pt that this was done.  Prednisone 50 mg PO 11/25/19 @ 0120, 0720 and 1320; Benadryl 50 mg PO @ 1320.

## 2019-11-18 DIAGNOSIS — E291 Testicular hypofunction: Secondary | ICD-10-CM | POA: Diagnosis not present

## 2019-11-19 ENCOUNTER — Other Ambulatory Visit: Payer: Self-pay

## 2019-11-19 ENCOUNTER — Ambulatory Visit (INDEPENDENT_AMBULATORY_CARE_PROVIDER_SITE_OTHER): Payer: Medicare Other | Admitting: Podiatry

## 2019-11-19 ENCOUNTER — Encounter: Payer: Self-pay | Admitting: Podiatry

## 2019-11-19 DIAGNOSIS — M79674 Pain in right toe(s): Secondary | ICD-10-CM

## 2019-11-19 DIAGNOSIS — E1142 Type 2 diabetes mellitus with diabetic polyneuropathy: Secondary | ICD-10-CM | POA: Diagnosis not present

## 2019-11-19 DIAGNOSIS — E1149 Type 2 diabetes mellitus with other diabetic neurological complication: Secondary | ICD-10-CM

## 2019-11-19 DIAGNOSIS — L84 Corns and callosities: Secondary | ICD-10-CM | POA: Diagnosis not present

## 2019-11-19 DIAGNOSIS — B351 Tinea unguium: Secondary | ICD-10-CM

## 2019-11-19 DIAGNOSIS — M79675 Pain in left toe(s): Secondary | ICD-10-CM | POA: Diagnosis not present

## 2019-11-20 NOTE — Progress Notes (Signed)
Subjective: Jason Hartman presents today at risk foot care with history of diabetic neuropathy and painful callus(es) right hallux and left foot and painful thick toenails that are difficult to trim. Pain interferes with ambulation. Aggravating factors include wearing enclosed shoe gear. Pain is relieved with periodic professional debridement.   He is here for cash-pay visit today.  Neuropathy pain is managed with gabapentin.  His wife is present during today's visit. She has recuperated from her surgery and is getting stronger.   Jason Hartman states his left foot plantar callus is tender on today. He voices no new pedal problems on today's visit.  Jason Manes, MD is patient's PCP. Last visit was: 07/30/2019.  Past Medical History:  Diagnosis Date   Cellulitis and abscess of leg, except foot    Hypertension    Obstructive sleep apnea (adult) (pediatric)    Pain in joint, pelvic region and thigh    Pneumonia, organism unspecified(486)    Restless legs syndrome (RLS)    RLS (restless legs syndrome) 09/01/2012   Thoracic or lumbosacral neuritis or radiculitis, unspecified    Type II or unspecified type diabetes mellitus without mention of complication, not stated as uncontrolled    Unspecified disease of pericardium    Unspecified hereditary and idiopathic peripheral neuropathy      Patient Active Problem List   Diagnosis Date Noted   Hypertensive urgency 04/26/2019   Malignant HTN with heart disease, w/o CHF, w/o chronic kidney disease 04/26/2019   Elevated troponin    LFTs abnormal    Penis pain 04/10/2018   Urethra cancer ([place]) 02/25/2018   Lower extremity edema 06/09/2017   Peptic ulcer disease 05/16/2017   GERD (gastroesophageal reflux disease) 05/16/2017   Chronic kidney disease, stage 3 04/11/2017   Lower urinary tract symptoms (LUTS) 04/15/2016   Narcotic dependence ([place]) 08/31/2015   Imbalance 08/31/2015   Diabetic peripheral neuropathy ([place])  08/31/2015   Other malaise and fatigue 05/02/2015   Chronic fatigue 05/02/2015   Panic disorder without agoraphobia 03/22/2015   Hyperlipidemia 02/03/2014   Thrombocytopenia ([place]) 08/18/2013   Parkinson disease ([place]) 08/18/2013   Other disorders of lung 08/18/2013   Organic impotence 08/18/2013   Memory loss 08/18/2013   Low back pain 08/18/2013   Iron deficiency anemia 08/18/2013   Hypercalcemia 08/18/2013   Cellulitis of right leg 08/18/2013   Atherosclerotic heart disease of native coronary artery without angina pectoris 08/18/2013   RLS (restless legs syndrome) 09/01/2012   HERPES SIMPLEX INFECTION 11/06/2006   DIABETES MELLITUS, TYPE II 11/06/2006   HYPOGONADISM 11/06/2006   VITAMIN D DEFICIENCY 11/06/2006   ANXIETY 11/06/2006   OBSTRUCTIVE SLEEP APNEA 11/06/2006   RESTLESS LEG SYNDROME 11/06/2006   HYPERTENSION 11/06/2006   BENIGN PROSTATIC HYPERTROPHY 11/06/2006   ROSACEA 11/06/2006   OSTEOARTHRITIS 11/06/2006   INSOMNIA 11/06/2006   HYPERGLYCEMIA 11/06/2006   COLONIC POLYPS, HX OF 11/06/2006    Current Outpatient Medications on File Prior to Visit  Medication Sig Dispense Refill   allopurinol (ZYLOPRIM) 100 MG tablet Take 2 tablets daily. 60 tablet 0   ALPRAZolam (XANAX) 0.5 MG tablet Take 0.5 mg by mouth 2 (two) times daily as needed for anxiety.      aspirin EC 81 MG tablet Take 1 tablet (81 mg total) by mouth daily. 150 tablet 2   cetirizine (ZYRTEC) 10 MG tablet Take 10 mg by mouth daily as needed for allergies.      chlorthalidone (HYGROTON) 25 MG tablet TAKE 1/2 TABLET BY MOUTH EVERY DAY IN  THE MORNING WITH FOOD     ciprofloxacin (CIPRO) 500 MG tablet Take 500 mg by mouth 2 (two) times daily.     clotrimazole-betamethasone (LOTRISONE) cream Apply topically 2 (two) times daily.     co-enzyme Q-10 50 MG capsule Take 50 mg by mouth daily.     diltiazem (CARDIZEM CD) 120 MG 24 hr capsule Take 120 mg by mouth daily.       famotidine (PEPCID) 40 MG tablet Take 40 mg by mouth daily.      ferrous sulfate 325 (65 FE) MG EC tablet Take 325 mg by mouth daily.      fluconazole (DIFLUCAN) 200 MG tablet      fluticasone (FLONASE) 50 MCG/ACT nasal spray Place 2 sprays into the nose daily.      gabapentin (NEURONTIN) 600 MG tablet Take 1 tablet (600 mg total) by mouth 3 (three) times daily.     HYDROcodone-acetaminophen (NORCO/VICODIN) 5-325 MG per tablet Take 1 tablet by mouth at bedtime as needed for moderate pain.      L-Methylfolate-Algae-B12-B6 (METANX) 3-90.314-2-35 MG CAPS Take by mouth.     LANTUS SOLOSTAR 100 UNIT/ML SOPN Inject 85 Units into the skin 2 (two) times daily.      liraglutide (VICTOZA) 18 MG/3ML SOPN Inject 1.2 mg into the skin daily.      magnesium citrate SOLN Take by mouth.     metFORMIN (GLUCOPHAGE-XR) 500 MG 24 hr tablet      NEUPRO 8 MG/24HR PT24 Place 1 patch onto the skin daily. 30 patch    ofloxacin (OCUFLOX) 0.3 % ophthalmic solution INSTILL 1 TO 2 DROPS IN LEFT EYE FOUR TIMES DAILY FOR ACUTE OPIOID THERAPY DAYS     Omega-3 Fatty Acids (FISH OIL) 1000 MG CAPS Take by mouth.     polyethylene glycol powder (GLYCOLAX/MIRALAX) 17 GM/SCOOP powder Take by mouth.     predniSONE (DELTASONE) 50 MG tablet Take by mouth.     valsartan (DIOVAN) 320 MG tablet Take 320 mg by mouth daily.     No current facility-administered medications on file prior to visit.     Allergies  Allergen Reactions   Iodinated Diagnostic Agents Hives    alleric to renografin,isovue & omnipaque, hives, requires 13 hr prep//a.calhoun, Onset Date: [id number]      Methadone Hcl Other (See Comments)    HALLUCINATIONS    Promethazine Other (See Comments)    Mental status change, DELIRIUM     Morphine Sulfate Other (See Comments)    Pt not sure about allergy    Oxycodone Hcl Other (See Comments)    Pt not sure about allergy    Penicillins Other (See Comments)    Pt not sure about allergy     Quetiapine Other (See Comments)    Pt not sure about allergy    Zolpidem Other (See Comments)   Atorvastatin Itching and Other (See Comments)    Tired, weakness    Carbidopa-Levodopa Anxiety   Clindamycin Rash   Colesevelam Other (See Comments)    tired    Doxazosin Rash   Iodine Hives and Rash   Linezolid Hives and Rash        Lovastatin Other (See Comments)    Tired, nervousness    Rosuvastatin Other (See Comments)    Tired, weakness     Objective: Jason Hartman is a pleasant 84 y.o. Caucasian male obese in NAD. AAO x 3.  There were no vitals filed for this visit.  Vascular Examination: Capillary refill  time to digits immediate b/l. Palpable pedal pulses b/l LE. Pedal hair present. Lower extremity skin temperature gradient within normal limits. No pain with calf compression b/l. No edema noted b/l lower extremities.  Dermatological Examination: Pedal skin with normal turgor, texture and tone bilaterally. No open wounds bilaterally. No interdigital macerations bilaterally. Toenails 1-5 b/l elongated, discolored, dystrophic, thickened, crumbly with subungual debris and tenderness to dorsal palpation. Hyperkeratotic lesion(s) R hallux and submet head 5 left foot.  No erythema, no edema, no drainage, no flocculence.  Musculoskeletal: Normal muscle strength 5/5 to all lower extremity muscle groups bilaterally. No pain crepitus or joint limitation noted with ROM b/l. No gross bony deformities bilaterally. Uses walking stick for ambulation assistance.  Neurological Examination: Pt has subjective symptoms of neuropathy. Protective sensation diminished with 10g monofilament b/l. Vibratory sensation decreased b/l. Clonus negative b/l.  Last A1c: Hemoglobin A1C Latest Ref Rng & Units 04/26/2019  HGBA1C 4.8 - 5.6 % 9.1(H)  Some recent data might be hidden     Assessment: 1. Pain due to onychomycosis of toenails of both feet   2. Callus   3. Type II diabetes mellitus  with neurological manifestations ([place])   4. Diabetic peripheral neuropathy associated with type 2 diabetes mellitus ([place])    Plan: -Examined patient. -No new findings. No new orders. -Continue diabetic foot care principles. -Toenails 1-5 b/l were debrided in length and girth with sterile nail nippers and dremel without iatrogenic bleeding.  -Callus(es) R hallux and submet head 5 left foot pared utilizing sterile scalpel blade without complication or incident. Total number debrided =2. -Patient to report any pedal injuries to medical professional immediately. -Patient to continue soft, supportive shoe gear daily. -Patient/POA to call should there be question/concern in the interim.  Return in about 4 weeks (around 12/17/2019) for diabetic nail and callus trim.  Marzetta Board, DPM

## 2019-11-23 ENCOUNTER — Other Ambulatory Visit: Payer: Self-pay | Admitting: Geriatric Medicine

## 2019-11-23 DIAGNOSIS — I77810 Thoracic aortic ectasia: Secondary | ICD-10-CM

## 2019-11-23 DIAGNOSIS — K5900 Constipation, unspecified: Secondary | ICD-10-CM | POA: Diagnosis not present

## 2019-11-23 DIAGNOSIS — K219 Gastro-esophageal reflux disease without esophagitis: Secondary | ICD-10-CM | POA: Diagnosis not present

## 2019-11-23 DIAGNOSIS — R1310 Dysphagia, unspecified: Secondary | ICD-10-CM | POA: Diagnosis not present

## 2019-11-25 ENCOUNTER — Inpatient Hospital Stay: Admission: RE | Admit: 2019-11-25 | Payer: Medicare Other | Source: Ambulatory Visit

## 2019-11-26 DIAGNOSIS — M19012 Primary osteoarthritis, left shoulder: Secondary | ICD-10-CM | POA: Diagnosis not present

## 2019-11-29 ENCOUNTER — Other Ambulatory Visit: Payer: Self-pay | Admitting: Geriatric Medicine

## 2019-11-29 ENCOUNTER — Telehealth: Payer: Self-pay

## 2019-11-29 DIAGNOSIS — I77810 Thoracic aortic ectasia: Secondary | ICD-10-CM

## 2019-11-29 NOTE — Telephone Encounter (Signed)
Spoke with wife to let her know new 13-hr prep was just called in to their [facility] in [place].  Prednisone 50mg  PO 12/01/19 @ 0130, 0730 and 1330; Benadryl 50mg  PO @ 1330.  Wife stated an understanding of these instructions.

## 2019-12-01 ENCOUNTER — Ambulatory Visit
Admission: RE | Admit: 2019-12-01 | Discharge: 2019-12-01 | Disposition: A | Discharge status: Home / Self Care | Payer: Medicare Other | Source: Ambulatory Visit | Attending: Geriatric Medicine | Admitting: Geriatric Medicine

## 2019-12-01 ENCOUNTER — Other Ambulatory Visit: Payer: Self-pay

## 2019-12-01 ENCOUNTER — Other Ambulatory Visit: Payer: Self-pay | Admitting: Geriatric Medicine

## 2019-12-01 DIAGNOSIS — I77810 Thoracic aortic ectasia: Secondary | ICD-10-CM

## 2019-12-03 DIAGNOSIS — E291 Testicular hypofunction: Secondary | ICD-10-CM | POA: Diagnosis not present

## 2019-12-07 DIAGNOSIS — E1121 Type 2 diabetes mellitus with diabetic nephropathy: Secondary | ICD-10-CM | POA: Diagnosis not present

## 2019-12-07 DIAGNOSIS — N183 Chronic kidney disease, stage 3 unspecified: Secondary | ICD-10-CM | POA: Diagnosis not present

## 2019-12-07 DIAGNOSIS — G20 Parkinson's disease: Secondary | ICD-10-CM | POA: Diagnosis not present

## 2019-12-07 DIAGNOSIS — N401 Enlarged prostate with lower urinary tract symptoms: Secondary | ICD-10-CM | POA: Diagnosis not present

## 2019-12-07 DIAGNOSIS — I129 Hypertensive chronic kidney disease with stage 1 through stage 4 chronic kidney disease, or unspecified chronic kidney disease: Secondary | ICD-10-CM | POA: Diagnosis not present

## 2019-12-07 DIAGNOSIS — G2581 Restless legs syndrome: Secondary | ICD-10-CM | POA: Diagnosis not present

## 2019-12-14 ENCOUNTER — Telehealth: Payer: Self-pay | Admitting: Nurse Practitioner

## 2019-12-14 DIAGNOSIS — E291 Testicular hypofunction: Secondary | ICD-10-CM | POA: Diagnosis not present

## 2019-12-14 DIAGNOSIS — N472 Paraphimosis: Secondary | ICD-10-CM | POA: Diagnosis not present

## 2019-12-14 DIAGNOSIS — Z8551 Personal history of malignant neoplasm of bladder: Secondary | ICD-10-CM | POA: Diagnosis not present

## 2019-12-14 DIAGNOSIS — C680 Malignant neoplasm of urethra: Secondary | ICD-10-CM | POA: Diagnosis not present

## 2019-12-14 NOTE — Telephone Encounter (Signed)
Phone call to patient to review instructions for 13 hr prep for CT w/ contrast on 12/21/19 at 1440. Prescription called into [facility]. Pt aware and verbalized understanding of instructions. Prescription: 0140- 50mg  Prednisone 0740- 50mg  Prednisone 1340- 50mg  Prednisone and 50mg  Benadryl

## 2019-12-21 ENCOUNTER — Ambulatory Visit
Admission: RE | Admit: 2019-12-21 | Discharge: 2019-12-21 | Disposition: A | Discharge status: Home / Self Care | Payer: Medicare Other | Source: Ambulatory Visit | Attending: Geriatric Medicine | Admitting: Geriatric Medicine

## 2019-12-21 ENCOUNTER — Other Ambulatory Visit: Payer: Self-pay | Admitting: Geriatric Medicine

## 2019-12-21 ENCOUNTER — Other Ambulatory Visit: Payer: Self-pay

## 2019-12-21 DIAGNOSIS — I77810 Thoracic aortic ectasia: Secondary | ICD-10-CM

## 2019-12-21 DIAGNOSIS — I2510 Atherosclerotic heart disease of native coronary artery without angina pectoris: Secondary | ICD-10-CM | POA: Diagnosis not present

## 2019-12-21 DIAGNOSIS — I700 Atherosclerosis of aorta: Secondary | ICD-10-CM | POA: Diagnosis not present

## 2019-12-21 DIAGNOSIS — I712 Thoracic aortic aneurysm, without rupture: Secondary | ICD-10-CM | POA: Diagnosis not present

## 2019-12-21 IMAGING — CT CT CHEST W/O CM
2 of 4 series · 12 of 36 positions shown, 15 images · non-contrast
Comparison: Chest CT [DATE].

CLINICAL DATA: Follow up thoracic aortic aneurysm. Iodinated
contrast allergy.

EXAM:
CT CHEST WITHOUT CONTRAST
TECHNIQUE: Multidetector CT imaging of the chest was performed following the
standard protocol without IV contrast.

[Series 2: chest 2.00 br40 s3 · axial · 0.65mm/px · z∈[+1460,+1704]mm · 9 of 146 slices shown, 12 images (1 of 2)]
[im 12/146  mediastinal]
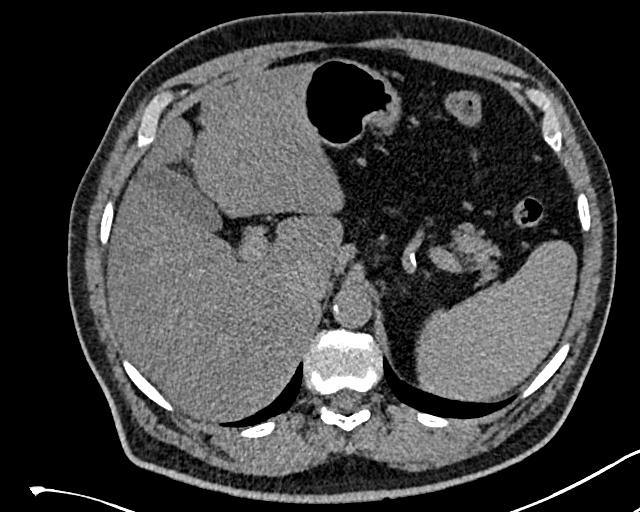
[im 12/146  lung]
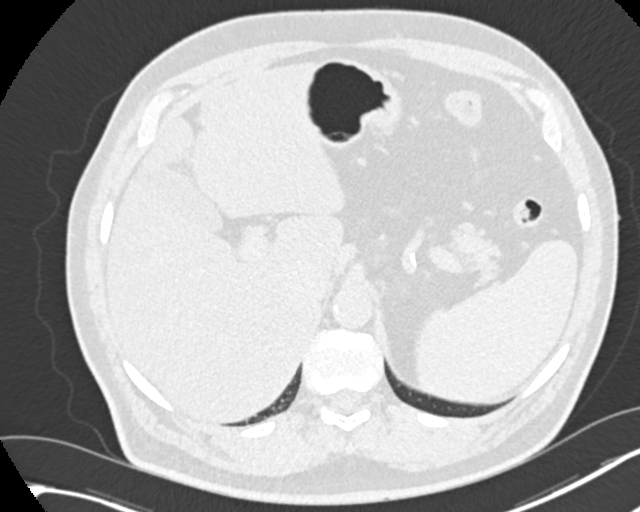
[im 34/146  lung]
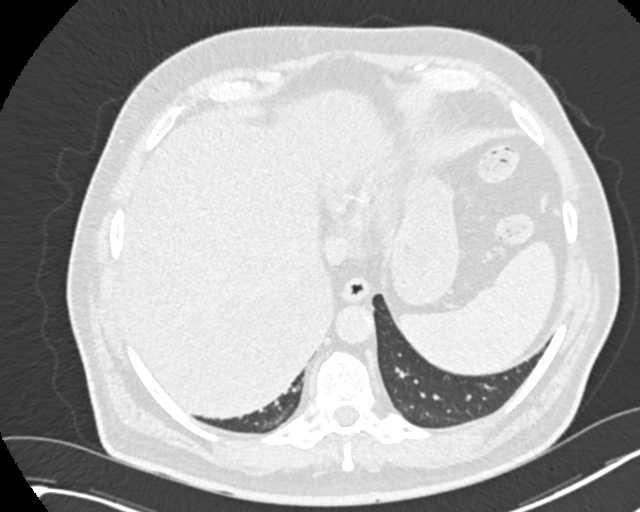
[im 45/146  lung]
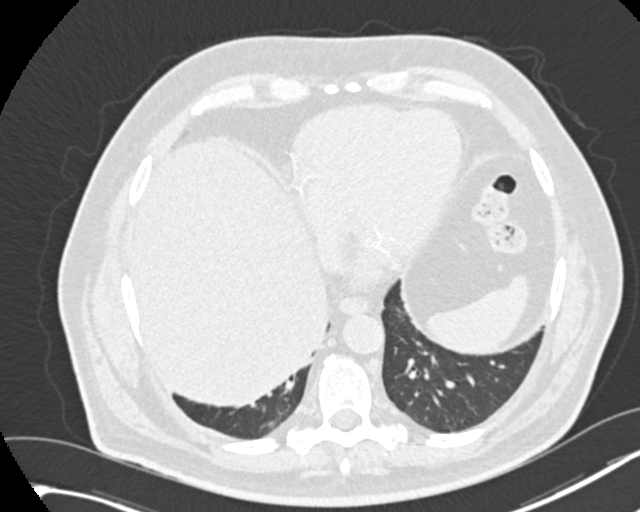
[im 56/146  lung]
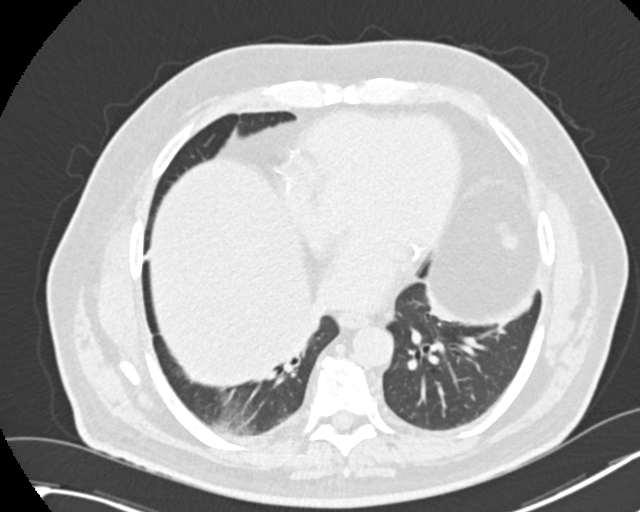
[im 79/146  mediastinal]
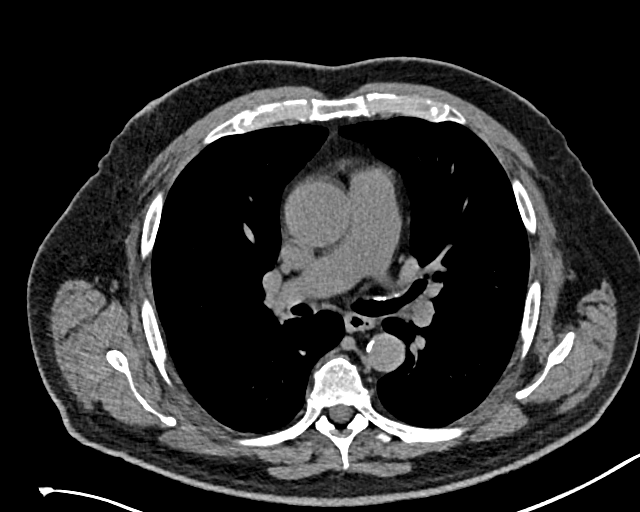
[im 79/146  lung]
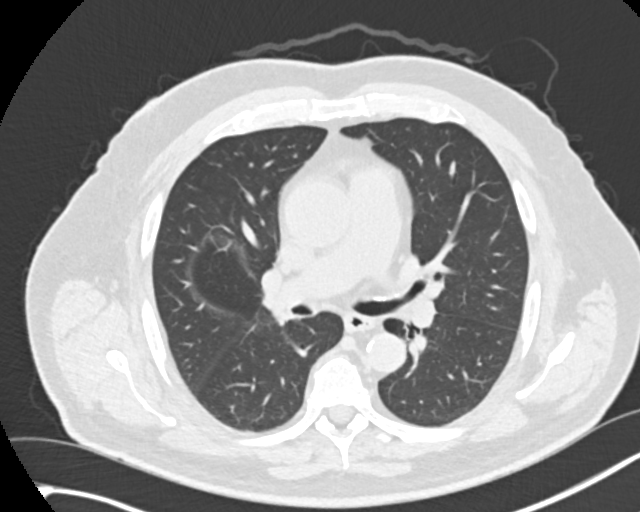
[im 90/146  lung]
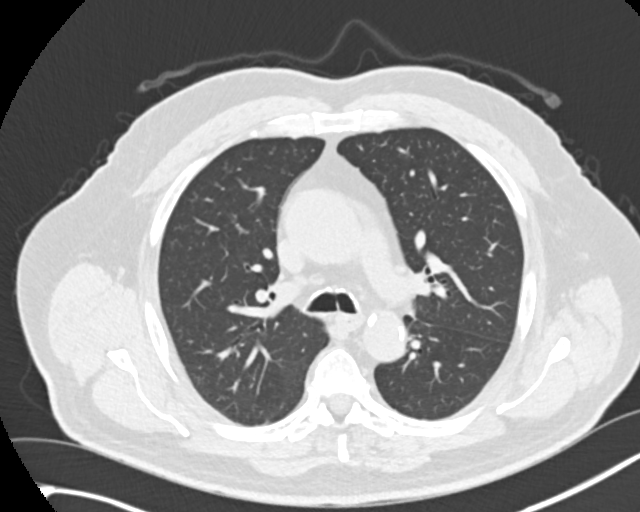
[im 101/146  lung]
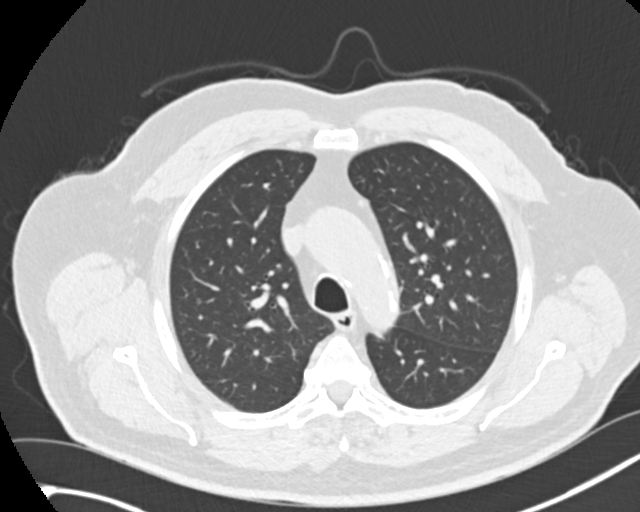
[im 123/146  lung]
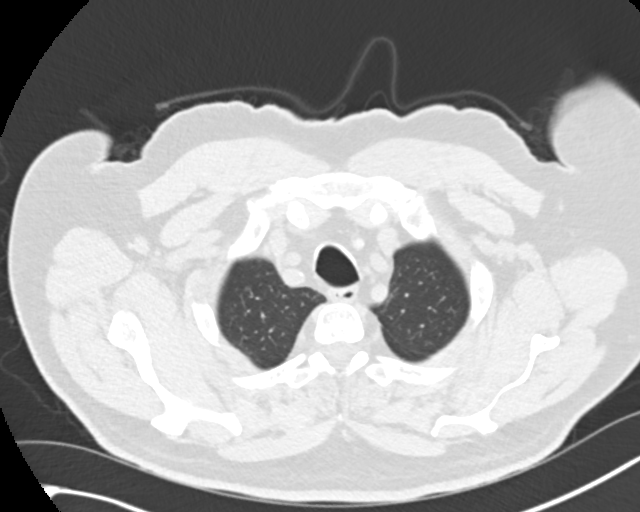
[im 134/146  mediastinal]
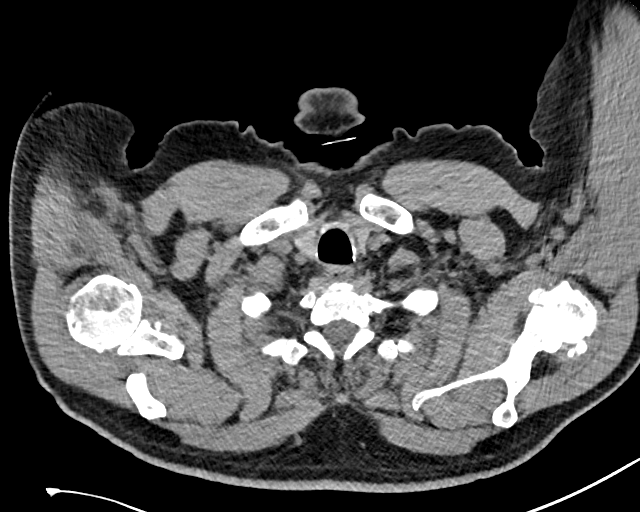
[im 134/146  lung]
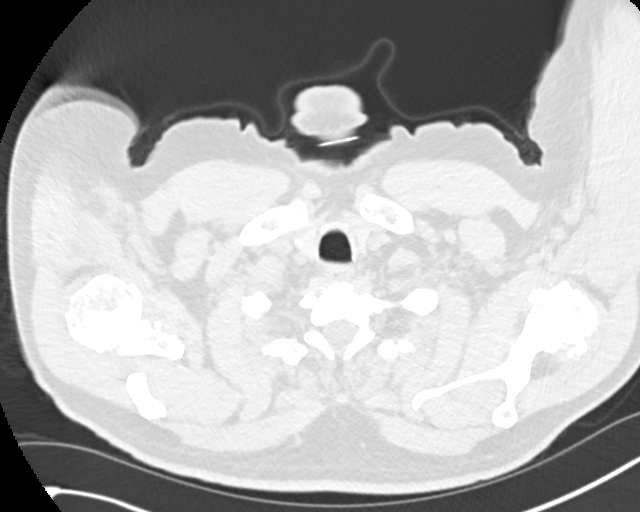

[Series 4: chest 2.00 br40 s3 · coronal · 0.57mm/px · 3 of 167 slices shown (2 of 2)]
[im 34/167  lung]
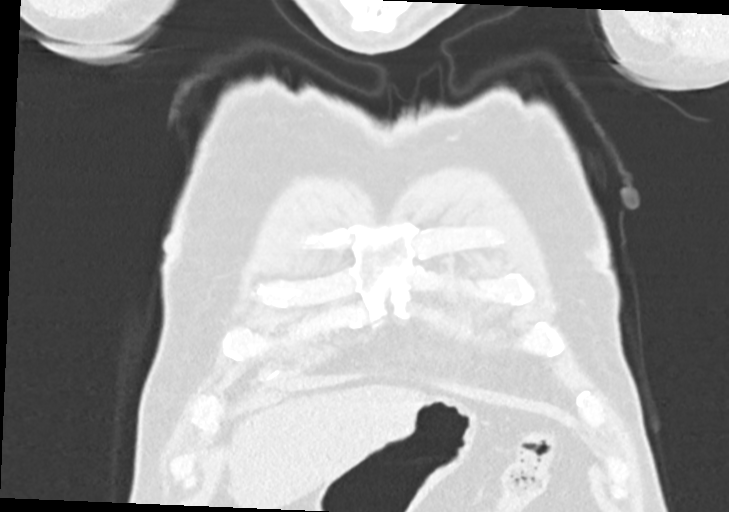
[im 67/167  lung]
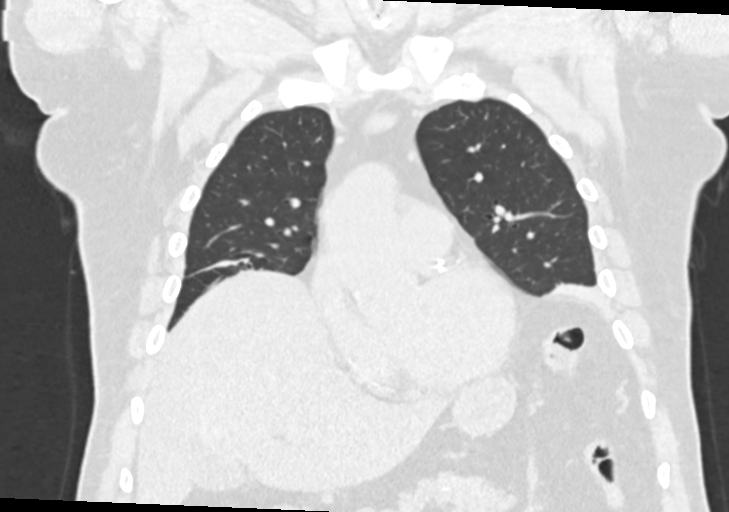
[im 100/167  lung]
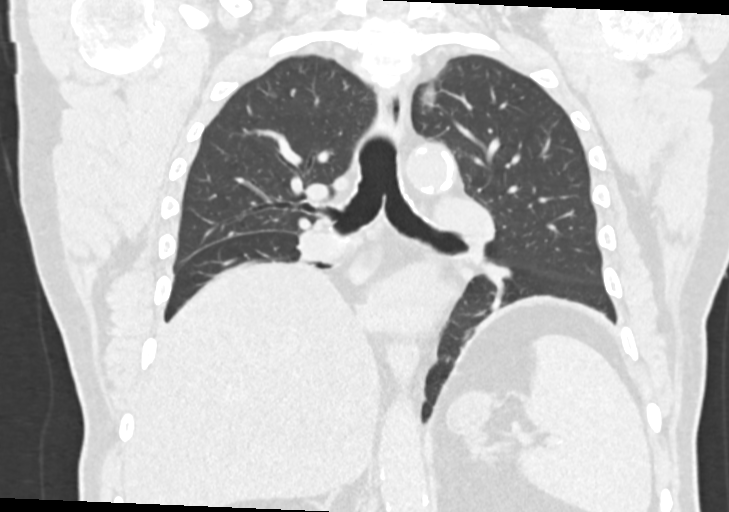

[12 of 36 positions shown; findings below may reference images not displayed]

FINDINGS: Cardiovascular: Atherosclerosis of the aorta, great vessels and
coronary arteries again noted. There is stable dilatation of the
ascending aorta, having a maximal diameter 4.4 cm. No displaced
intimal calcifications or acute vascular findings on noncontrast
imaging. The heart size is normal. There is no pericardial effusion.

Mediastinum/Nodes: There are no enlarged mediastinal, hilar or
axillary lymph nodes. The thyroid gland, trachea and esophagus
demonstrate no significant findings.

Lungs/Pleura: Chronic low lung volumes with asymmetric elevation of
the right hemidiaphragm. No pleural effusion or pneumothorax. There
is stable chronic atelectasis or scarring at both lung bases there
are small ground-glass nodules in both lungs, largest measuring 5 mm
in the right upper lobe on image [DATE]. These are stable. There are
no suspicious pulmonary nodules.

Upper abdomen: Generalized hepatic low density consistent with
steatosis. No focal or acute findings.

Musculoskeletal/Chest wall: There is no chest wall mass or
suspicious osseous finding.
IMPRESSION: 1. Stable dilatation of the ascending aorta to 4.4 cm. Recommend
annual imaging followup by CTA or MRA. This recommendation follows
[[place]] ACCF/AHA/AATS/ACR/ASA/SCA/KASMI/KASMI/KASMI/KASMI Guidelines for the
Diagnosis and Management of Patients with Thoracic Aortic Disease.
Circulation. [[place]]; 121: [id number]
2. No acute chest findings.
3. Extensive three-vessel coronary artery atherosclerosis. Aortic
Atherosclerosis ([WY]-[WY]).
4. Hepatic steatosis.

## 2019-12-24 ENCOUNTER — Other Ambulatory Visit: Payer: Self-pay

## 2019-12-24 ENCOUNTER — Encounter: Payer: Self-pay | Admitting: Podiatry

## 2019-12-24 ENCOUNTER — Ambulatory Visit (INDEPENDENT_AMBULATORY_CARE_PROVIDER_SITE_OTHER): Payer: Medicare Other | Admitting: Podiatry

## 2019-12-24 DIAGNOSIS — M79675 Pain in left toe(s): Secondary | ICD-10-CM

## 2019-12-24 DIAGNOSIS — E1149 Type 2 diabetes mellitus with other diabetic neurological complication: Secondary | ICD-10-CM | POA: Diagnosis not present

## 2019-12-24 DIAGNOSIS — L84 Corns and callosities: Secondary | ICD-10-CM | POA: Diagnosis not present

## 2019-12-24 DIAGNOSIS — B351 Tinea unguium: Secondary | ICD-10-CM | POA: Diagnosis not present

## 2019-12-24 DIAGNOSIS — M79674 Pain in right toe(s): Secondary | ICD-10-CM | POA: Diagnosis not present

## 2019-12-26 NOTE — Progress Notes (Signed)
Subjective: Jason Hartman presents today at risk foot care with history of diabetic neuropathy and painful callus(es) right hallux and left foot and painful thick toenails that are difficult to trim. Pain interferes with ambulation. Aggravating factors include wearing enclosed shoe gear. Pain is relieved with periodic professional debridement.   He is here for Medicare covered visit on today. His neuropathy pain is managed with gabapentin.  His wife is present during today's visit.  Jason Hartman states his left foot plantar callus is tender on today. He voices no new pedal problems on today's visit.  Jason Manes, MD is patient's PCP. Last visit was: 12/03/2019.  Past Medical History:  Diagnosis Date  . Cellulitis and abscess of leg, except foot   . Hypertension   . Obstructive sleep apnea (adult) (pediatric)   . Pain in joint, pelvic region and thigh   . Pneumonia, organism unspecified(486)   . Restless legs syndrome (RLS)   . RLS (restless legs syndrome) 09/01/2012  . Thoracic or lumbosacral neuritis or radiculitis, unspecified   . Type II or unspecified type diabetes mellitus without mention of complication, not stated as uncontrolled   . Unspecified disease of pericardium   . Unspecified hereditary and idiopathic peripheral neuropathy      Patient Active Problem List   Diagnosis Date Noted  . Recurrent UTI 07/16/2019  . Hypertensive urgency 04/26/2019  . Malignant HTN with heart disease, w/o CHF, w/o chronic kidney disease 04/26/2019  . Elevated troponin   . LFTs abnormal   . Penis pain 04/10/2018  . Urethra cancer ([place]) 02/25/2018  . Lower extremity edema 06/09/2017  . Peptic ulcer disease 05/16/2017  . GERD (gastroesophageal reflux disease) 05/16/2017  . Chronic kidney disease, stage 3 04/11/2017  . Lower urinary tract symptoms (LUTS) 04/15/2016  . Narcotic dependence ([place]) 08/31/2015  . Imbalance 08/31/2015  . Diabetic peripheral neuropathy ([place]) 08/31/2015  . Other  malaise and fatigue 05/02/2015  . Chronic fatigue 05/02/2015  . Panic disorder without agoraphobia 03/22/2015  . Hyperlipidemia 02/03/2014  . Thrombocytopenia ([place]) 08/18/2013  . Parkinson disease ([place]) 08/18/2013  . Other disorders of lung 08/18/2013  . Organic impotence 08/18/2013  . Memory loss 08/18/2013  . Low back pain 08/18/2013  . Iron deficiency anemia 08/18/2013  . Hypercalcemia 08/18/2013  . Cellulitis of right leg 08/18/2013  . Atherosclerotic heart disease of native coronary artery without angina pectoris 08/18/2013  . RLS (restless legs syndrome) 09/01/2012  . HERPES SIMPLEX INFECTION 11/06/2006  . DIABETES MELLITUS, TYPE II 11/06/2006  . HYPOGONADISM 11/06/2006  . VITAMIN D DEFICIENCY 11/06/2006  . ANXIETY 11/06/2006  . OBSTRUCTIVE SLEEP APNEA 11/06/2006  . RESTLESS LEG SYNDROME 11/06/2006  . HYPERTENSION 11/06/2006  . BENIGN PROSTATIC HYPERTROPHY 11/06/2006  . ROSACEA 11/06/2006  . OSTEOARTHRITIS 11/06/2006  . INSOMNIA 11/06/2006  . HYPERGLYCEMIA 11/06/2006  . COLONIC POLYPS, HX OF 11/06/2006    Current Outpatient Medications on File Prior to Visit  Medication Sig Dispense Refill  . allopurinol (ZYLOPRIM) 100 MG tablet Take 2 tablets daily. 60 tablet 0  . ALPRAZolam (XANAX) 0.5 MG tablet Take 0.5 mg by mouth 2 (two) times daily as needed for anxiety.     Marland Kitchen aspirin EC 81 MG tablet Take 1 tablet (81 mg total) by mouth daily. 150 tablet 2  . cetirizine (ZYRTEC) 10 MG tablet Take 10 mg by mouth daily as needed for allergies.     . chlorthalidone (HYGROTON) 25 MG tablet TAKE 1/2 TABLET BY MOUTH EVERY DAY IN THE MORNING WITH FOOD    .  ciprofloxacin (CIPRO) 500 MG tablet Take 500 mg by mouth 2 (two) times daily.    . clotrimazole-betamethasone (LOTRISONE) cream Apply topically 2 (two) times daily.    Marland Kitchen co-enzyme Q-10 50 MG capsule Take 50 mg by mouth daily.    Marland Kitchen diltiazem (CARDIZEM CD) 120 MG 24 hr capsule Take 120 mg by mouth daily.     . famotidine (PEPCID) 40 MG  tablet Take 40 mg by mouth daily.     . ferrous sulfate 325 (65 FE) MG EC tablet Take 325 mg by mouth daily.     . fluconazole (DIFLUCAN) 200 MG tablet     . fluticasone (FLONASE) 50 MCG/ACT nasal spray Place 2 sprays into the nose daily.     Marland Kitchen gabapentin (NEURONTIN) 600 MG tablet Take 1 tablet (600 mg total) by mouth 3 (three) times daily.    Marland Kitchen HYDROcodone-acetaminophen (NORCO/VICODIN) 5-325 MG per tablet Take 1 tablet by mouth at bedtime as needed for moderate pain.     Marland Kitchen L-Methylfolate-Algae-B12-B6 (METANX) 3-90.314-2-35 MG CAPS Take by mouth.    Marland Kitchen LANTUS SOLOSTAR 100 UNIT/ML SOPN Inject 85 Units into the skin 2 (two) times daily.     Marland Kitchen liraglutide (VICTOZA) 18 MG/3ML SOPN Inject 1.2 mg into the skin daily.     . magnesium citrate SOLN Take by mouth.    . metFORMIN (GLUCOPHAGE-XR) 500 MG 24 hr tablet     . NEUPRO 8 MG/24HR PT24 Place 1 patch onto the skin daily. 30 patch   . ofloxacin (OCUFLOX) 0.3 % ophthalmic solution INSTILL 1 TO 2 DROPS IN LEFT EYE FOUR TIMES DAILY FOR ACUTE OPIOID THERAPY DAYS    . Omega-3 Fatty Acids (FISH OIL) 1000 MG CAPS Take by mouth.    . polyethylene glycol powder (GLYCOLAX/MIRALAX) 17 GM/SCOOP powder Take by mouth.    . predniSONE (DELTASONE) 50 MG tablet Take by mouth.    . valsartan (DIOVAN) 320 MG tablet Take 320 mg by mouth daily.     No current facility-administered medications on file prior to visit.     Allergies  Allergen Reactions  . Iodinated Diagnostic Agents Hives    alleric to renografin,isovue & omnipaque, hives, requires 13 hr prep//a.calhoun, Onset Date: [id number]     . Iodine Hives and Rash    alleric to renografin,isovue & omnipaque, hives, requires 13 hr prep//a.calhoun, Onset Date: [id number]  . Linezolid Hives and Rash       . Methadone Hcl Other (See Comments)    HALLUCINATIONS   . Promethazine Other (See Comments)    Mental status change, DELIRIUM    . Morphine Sulfate Other (See Comments)    Pt not sure about allergy   .  Oxycodone Hcl Other (See Comments)    Pt not sure about allergy   . Penicillins Other (See Comments)    Pt not sure about allergy   . Quetiapine Other (See Comments)    Pt not sure about allergy   . Zolpidem Other (See Comments)  . Atorvastatin Itching and Other (See Comments)    Tired, weakness   . Carbidopa-Levodopa Anxiety  . Clindamycin Rash  . Colesevelam Other (See Comments)    tired   . Doxazosin Rash  . Lovastatin Other (See Comments)    Tired, nervousness   . Rosuvastatin Other (See Comments)    Tired, weakness     Objective: Jason Hartman is a pleasant 84 y.o. Caucasian male obese in NAD. AAO x 3.  There were no vitals filed  for this visit.  Vascular Examination: Capillary refill time to digits immediate b/l. Palpable pedal pulses b/l LE. Pedal hair present. Lower extremity skin temperature gradient within normal limits. No pain with calf compression b/l. No edema noted b/l lower extremities.  Dermatological Examination: Pedal skin with normal turgor, texture and tone bilaterally. No open wounds bilaterally. No interdigital macerations bilaterally. Toenails 1-5 b/l elongated, discolored, dystrophic, thickened, crumbly with subungual debris and tenderness to dorsal palpation. Hyperkeratotic lesion(s) R hallux and submet head 5 left foot.  No erythema, no edema, no drainage, no flocculence.  Musculoskeletal: Normal muscle strength 5/5 to all lower extremity muscle groups bilaterally. No pain crepitus or joint limitation noted with ROM b/l. No gross bony deformities bilaterally. Uses walking stick for ambulation assistance.  Neurological Examination: Pt has subjective symptoms of neuropathy. Protective sensation diminished with 10g monofilament b/l. Vibratory sensation decreased b/l. Clonus negative b/l.  Last A1c: Hemoglobin A1C Latest Ref Rng & Units 04/26/2019  HGBA1C 4.8 - 5.6 % 9.1(H)  Some recent data might be hidden     Assessment: 1. Pain due to  onychomycosis of toenails of both feet   2. Callus   3. Type II diabetes mellitus with neurological manifestations ([place])    Plan: -Examined patient. -No new findings. No new orders. -Continue diabetic foot care principles. -Toenails 1-5 b/l were debrided in length and girth with sterile nail nippers and dremel without iatrogenic bleeding.  -Callus(es) R hallux and submet head 5 left foot pared utilizing sterile scalpel blade without complication or incident. Total number debrided =2. -Patient to report any pedal injuries to medical professional immediately. -Patient to continue soft, supportive shoe gear daily. -Patient/POA to call should there be question/concern in the interim.  Return in 5 weeks (on 01/28/2020) for cash pay diabetic foot care.   Marzetta Board, DPM

## 2019-12-28 DIAGNOSIS — E291 Testicular hypofunction: Secondary | ICD-10-CM | POA: Diagnosis not present

## 2020-01-04 DIAGNOSIS — K5730 Diverticulosis of large intestine without perforation or abscess without bleeding: Secondary | ICD-10-CM | POA: Diagnosis not present

## 2020-01-04 DIAGNOSIS — K6389 Other specified diseases of intestine: Secondary | ICD-10-CM | POA: Diagnosis not present

## 2020-01-04 DIAGNOSIS — Z1211 Encounter for screening for malignant neoplasm of colon: Secondary | ICD-10-CM | POA: Diagnosis not present

## 2020-01-04 DIAGNOSIS — D124 Benign neoplasm of descending colon: Secondary | ICD-10-CM | POA: Diagnosis not present

## 2020-01-04 DIAGNOSIS — K648 Other hemorrhoids: Secondary | ICD-10-CM | POA: Diagnosis not present

## 2020-01-04 DIAGNOSIS — Z86010 Personal history of colonic polyps: Secondary | ICD-10-CM | POA: Diagnosis not present

## 2020-01-04 DIAGNOSIS — K635 Polyp of colon: Secondary | ICD-10-CM | POA: Diagnosis not present

## 2020-01-07 DIAGNOSIS — Z87891 Personal history of nicotine dependence: Secondary | ICD-10-CM | POA: Diagnosis not present

## 2020-01-07 DIAGNOSIS — Z955 Presence of coronary angioplasty implant and graft: Secondary | ICD-10-CM | POA: Diagnosis not present

## 2020-01-07 DIAGNOSIS — Z01812 Encounter for preprocedural laboratory examination: Secondary | ICD-10-CM | POA: Diagnosis not present

## 2020-01-07 DIAGNOSIS — N183 Chronic kidney disease, stage 3 unspecified: Secondary | ICD-10-CM | POA: Diagnosis not present

## 2020-01-07 DIAGNOSIS — G4730 Sleep apnea, unspecified: Secondary | ICD-10-CM | POA: Diagnosis not present

## 2020-01-07 DIAGNOSIS — Z7982 Long term (current) use of aspirin: Secondary | ICD-10-CM | POA: Diagnosis not present

## 2020-01-07 DIAGNOSIS — I129 Hypertensive chronic kidney disease with stage 1 through stage 4 chronic kidney disease, or unspecified chronic kidney disease: Secondary | ICD-10-CM | POA: Diagnosis not present

## 2020-01-07 DIAGNOSIS — I2510 Atherosclerotic heart disease of native coronary artery without angina pectoris: Secondary | ICD-10-CM | POA: Diagnosis not present

## 2020-01-07 DIAGNOSIS — E291 Testicular hypofunction: Secondary | ICD-10-CM | POA: Diagnosis not present

## 2020-01-17 DIAGNOSIS — Z6830 Body mass index (BMI) 30.0-30.9, adult: Secondary | ICD-10-CM | POA: Diagnosis not present

## 2020-01-17 DIAGNOSIS — E782 Mixed hyperlipidemia: Secondary | ICD-10-CM | POA: Diagnosis not present

## 2020-01-17 DIAGNOSIS — E1159 Type 2 diabetes mellitus with other circulatory complications: Secondary | ICD-10-CM | POA: Diagnosis not present

## 2020-01-17 DIAGNOSIS — I10 Essential (primary) hypertension: Secondary | ICD-10-CM | POA: Diagnosis not present

## 2020-01-17 DIAGNOSIS — Z794 Long term (current) use of insulin: Secondary | ICD-10-CM | POA: Diagnosis not present

## 2020-01-25 DIAGNOSIS — I129 Hypertensive chronic kidney disease with stage 1 through stage 4 chronic kidney disease, or unspecified chronic kidney disease: Secondary | ICD-10-CM | POA: Diagnosis not present

## 2020-01-25 DIAGNOSIS — N183 Chronic kidney disease, stage 3 unspecified: Secondary | ICD-10-CM | POA: Diagnosis not present

## 2020-01-25 DIAGNOSIS — N401 Enlarged prostate with lower urinary tract symptoms: Secondary | ICD-10-CM | POA: Diagnosis not present

## 2020-01-25 DIAGNOSIS — E1121 Type 2 diabetes mellitus with diabetic nephropathy: Secondary | ICD-10-CM | POA: Diagnosis not present

## 2020-01-28 ENCOUNTER — Encounter: Payer: Self-pay | Admitting: Podiatry

## 2020-01-28 ENCOUNTER — Other Ambulatory Visit: Payer: Self-pay

## 2020-01-28 ENCOUNTER — Ambulatory Visit (INDEPENDENT_AMBULATORY_CARE_PROVIDER_SITE_OTHER): Payer: Medicare Other | Admitting: Podiatry

## 2020-01-28 DIAGNOSIS — E1142 Type 2 diabetes mellitus with diabetic polyneuropathy: Secondary | ICD-10-CM

## 2020-01-28 DIAGNOSIS — B351 Tinea unguium: Secondary | ICD-10-CM | POA: Diagnosis not present

## 2020-01-28 DIAGNOSIS — M79674 Pain in right toe(s): Secondary | ICD-10-CM | POA: Diagnosis not present

## 2020-01-28 DIAGNOSIS — M79675 Pain in left toe(s): Secondary | ICD-10-CM | POA: Diagnosis not present

## 2020-01-28 DIAGNOSIS — L84 Corns and callosities: Secondary | ICD-10-CM

## 2020-01-30 NOTE — Progress Notes (Signed)
Subjective: RANDOLF SANSOUCIE presents today at risk foot care with history of diabetic neuropathy and painful callus(es) right hallux and left foot and painful thick toenails that are difficult to trim. Pain interferes with ambulation. Aggravating factors include wearing enclosed shoe gear. Pain is relieved with periodic professional debridement.   He is here for cash-pay visit on today. His neuropathy pain is managed with gabapentin.  His wife is present during today's visit.  Wife states she has been in the hospital.  [name], MD is patient's PCP. Last visit was: 12/28/2019.  Past Medical History:  Diagnosis Date  . Cellulitis and abscess of leg, except foot   . Hypertension   . Obstructive sleep apnea (adult) (pediatric)   . Pain in joint, pelvic region and thigh   . Pneumonia, organism unspecified(486)   . Restless legs syndrome (RLS)   . RLS (restless legs syndrome) 09/01/2012  . Thoracic or lumbosacral neuritis or radiculitis, unspecified   . Type II or unspecified type diabetes mellitus without mention of complication, not stated as uncontrolled   . Unspecified disease of pericardium   . Unspecified hereditary and idiopathic peripheral neuropathy      Patient Active Problem List   Diagnosis Date Noted  . Recurrent UTI 07/16/2019  . Hypertensive urgency 04/26/2019  . Malignant HTN with heart disease, w/o CHF, w/o chronic kidney disease 04/26/2019  . Elevated troponin   . LFTs abnormal   . Statin intolerance 01/15/2019  . Penis pain 04/10/2018  . 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 Lower extremity edema 06/09/2017  . Peptic ulcer disease 05/16/2017  . GERD (gastroesophageal reflux disease) 05/16/2017  . Chronic kidney disease, stage 3 ([place]) 04/11/2017  . Lower urinary tract symptoms (LUTS) 04/15/2016  . Narcotic dependence ([place]) 08/31/2015  . Imbalance 08/31/2015  . Diabetic peripheral neuropathy ([place]) 08/31/2015  . Other malaise and fatigue 05/02/2015  . Chronic  fatigue 05/02/2015  . Panic disorder without agoraphobia 03/22/2015  . Hyperlipidemia 02/03/2014  . Thrombocytopenia ([place]) 08/18/2013  . Parkinson disease ([place]) 08/18/2013  . Other disorders of lung 08/18/2013  . Organic impotence 08/18/2013  . Memory loss 08/18/2013  . Low back pain 08/18/2013  . Iron deficiency anemia 08/18/2013  . Hypercalcemia 08/18/2013  . Cellulitis of right leg 08/18/2013  . Atherosclerotic heart disease of native coronary artery without angina pectoris 08/18/2013  . RLS (restless legs syndrome) 09/01/2012  . HERPES SIMPLEX INFECTION 11/06/2006  . DIABETES MELLITUS, TYPE II 11/06/2006  . HYPOGONADISM 11/06/2006  . VITAMIN D DEFICIENCY 11/06/2006  . ANXIETY 11/06/2006  . OBSTRUCTIVE SLEEP APNEA 11/06/2006  . RESTLESS LEG SYNDROME 11/06/2006  . HYPERTENSION 11/06/2006  . BENIGN PROSTATIC HYPERTROPHY 11/06/2006  . ROSACEA 11/06/2006  . OSTEOARTHRITIS 11/06/2006  . INSOMNIA 11/06/2006  . HYPERGLYCEMIA 11/06/2006  . COLONIC POLYPS, HX OF 11/06/2006    Current Outpatient Medications on File Prior to Visit  Medication Sig Dispense Refill  . acyclovir (ZOVIRAX) 800 MG tablet Take 800 mg by mouth daily as needed.    Marland Kitchen allopurinol (ZYLOPRIM) 100 MG tablet Take 2 tablets daily. 60 tablet 0  . ALPRAZolam (XANAX) 0.5 MG tablet Take 0.5 mg by mouth 2 (two) times daily as needed for anxiety.     Marland Kitchen amitriptyline (ELAVIL) 25 MG tablet     . aspirin 81 MG chewable tablet Chew by mouth.    . BD PEN NEEDLE NANO 2ND GEN 32G X 4 MM MISC FOR LANTUS AND VICTOZA PENS    . cetirizine (ZYRTEC) 10 MG tablet  Take by mouth.    . chlorthalidone (HYGROTON) 25 MG tablet TAKE 1/2 TABLET BY MOUTH EVERY DAY IN THE MORNING WITH FOOD    . ciprofloxacin (CIPRO) 500 MG tablet Take 500 mg by mouth 2 (two) times daily.    . clotrimazole-betamethasone (LOTRISONE) cream Apply topically 2 (two) times daily.    Marland Kitchen co-enzyme Q-10 50 MG capsule Take 50 mg by mouth daily.    . cyproheptadine  (PERIACTIN) 4 MG tablet Take by mouth.    . diltiazem (CARDIZEM CD) 120 MG 24 hr capsule Take 120 mg by mouth daily.     . famotidine (PEPCID) 40 MG tablet Take 40 mg by mouth daily.     . ferrous sulfate 325 (65 FE) MG EC tablet Take 325 mg by mouth daily.     . fluconazole (DIFLUCAN) 200 MG tablet     . fluticasone (FLONASE) 50 MCG/ACT nasal spray Place 2 sprays into the nose daily.     Marland Kitchen gabapentin (NEURONTIN) 600 MG tablet Take 2 tablets by mouth 3 (three) times daily.    Marland Kitchen HYDROcodone-acetaminophen (NORCO/VICODIN) 5-325 MG tablet Take by mouth.    Marland Kitchen imipramine (TOFRANIL) 10 MG tablet Take by mouth.    . insulin glargine (LANTUS SOLOSTAR) 100 UNIT/ML Solostar Pen Inject into the skin.    Marland Kitchen L-Methylfolate-Algae-B12-B6 (METANX) 3-90.314-2-35 MG CAPS Take by mouth.    . liraglutide (VICTOZA) 18 MG/3ML SOPN Inject into the skin.    . magnesium citrate SOLN Take by mouth.    . metFORMIN (GLUCOPHAGE-XR) 500 MG 24 hr tablet Take by mouth.    . NEUPRO 8 MG/24HR PT24 Place 1 patch onto the skin daily. 30 patch   . ofloxacin (OCUFLOX) 0.3 % ophthalmic solution INSTILL 1 TO 2 DROPS IN LEFT EYE FOUR TIMES DAILY FOR ACUTE OPIOID THERAPY DAYS    . Omega-3 Fatty Acids (FISH OIL) 1000 MG CAPS Take by mouth.    Marland Kitchen OZEMPIC, 0.25 OR 0.5 MG/DOSE, 2 MG/1.5ML SOPN Inject into the skin.    . polyethylene glycol powder (GLYCOLAX/MIRALAX) 17 GM/SCOOP powder Take by mouth.    . predniSONE (DELTASONE) 50 MG tablet Take by mouth.    . primidone (MYSOLINE) 50 MG tablet Take by mouth.    . Semaglutide,0.25 or 0.5MG /DOS, (OZEMPIC, 0.25 OR 0.5 MG/DOSE,) 2 MG/1.5ML SOPN Inject into the skin.    . valsartan (DIOVAN) 320 MG tablet Take 320 mg by mouth daily.     No current facility-administered medications on file prior to visit.     Allergies  Allergen Reactions  . Iodinated Diagnostic Agents Hives    alleric to renografin,isovue & omnipaque, hives, requires 13 hr prep//a.calhoun, Onset Date: [id number]     .  Iodine Hives and Rash    alleric to renografin,isovue & omnipaque, hives, requires 13 hr prep//a.calhoun, Onset Date: [id number]  . Linezolid Hives and Rash       . Methadone Hcl Other (See Comments)    HALLUCINATIONS   . Promethazine Other (See Comments)    Mental status change, DELIRIUM    . Morphine Sulfate Other (See Comments)    Pt not sure about allergy   . Oxycodone Hcl Other (See Comments)    Pt not sure about allergy   . Penicillins Other (See Comments)    Pt not sure about allergy   . Quetiapine Other (See Comments)    Pt not sure about allergy   . Zolpidem Other (See Comments)  . Atorvastatin Itching and Other (See Comments)  Tired, weakness   . Carbidopa-Levodopa Anxiety  . Clindamycin Rash  . Colesevelam Other (See Comments)    tired   . Doxazosin Rash  . Lovastatin Other (See Comments)    Tired, nervousness   . Rosuvastatin Other (See Comments)    Tired, weakness     Objective: MALCOLM QUAST is a pleasant 84 y.o. Caucasian male obese in NAD. AAO x 3.  There were no vitals filed for this visit.  Vascular Examination: Capillary refill time to digits immediate b/l. Palpable pedal pulses b/l LE. Pedal hair present. Lower extremity skin temperature gradient within normal limits. No pain with calf compression b/l. No edema noted b/l lower extremities.  Dermatological Examination: Pedal skin with normal turgor, texture and tone bilaterally. No open wounds bilaterally. No interdigital macerations bilaterally. Toenails 1-5 b/l elongated, discolored, dystrophic, thickened, crumbly with subungual debris and tenderness to dorsal palpation. Hyperkeratotic lesion(s) R hallux and submet head 5 left foot.  No erythema, no edema, no drainage, no flocculence.  Musculoskeletal: Normal muscle strength 5/5 to all lower extremity muscle groups bilaterally. No pain crepitus or joint limitation noted with ROM b/l. No gross bony deformities bilaterally. Uses walking stick for  ambulation assistance.  Neurological Examination: Pt has subjective symptoms of neuropathy. Protective sensation diminished with 10g monofilament b/l. Vibratory sensation decreased b/l. Clonus negative b/l.  Last A1c: Hemoglobin A1C Latest Ref Rng & Units 04/26/2019  HGBA1C 4.8 - 5.6 % 9.1(H)  Some recent data might be hidden     Assessment: 1. Pain due to onychomycosis of toenails of both feet   2. Callus   3. Diabetic peripheral neuropathy associated with type 2 diabetes mellitus ([place])    Plan: -Examined patient. -No new findings. No new orders. -Continue diabetic foot care principles. -Toenails 1-5 b/l were debrided in length and girth with sterile nail nippers and dremel without iatrogenic bleeding.  -Callus(es) R hallux and submet head 5 left foot pared utilizing sterile scalpel blade without complication or incident. Total number debrided =2. -Patient to report any pedal injuries to medical professional immediately. -Patient to continue soft, supportive shoe gear daily. -Patient/POA to call should there be question/concern in the interim.  Return in 5 weeks (on 01/28/2020) for cash pay diabetic foot care.   Marzetta Board, DPM

## 2020-02-08 DIAGNOSIS — N35919 Unspecified urethral stricture, male, unspecified site: Secondary | ICD-10-CM | POA: Diagnosis not present

## 2020-02-08 DIAGNOSIS — E291 Testicular hypofunction: Secondary | ICD-10-CM | POA: Diagnosis not present

## 2020-02-08 DIAGNOSIS — N481 Balanitis: Secondary | ICD-10-CM | POA: Diagnosis not present

## 2020-02-08 DIAGNOSIS — N472 Paraphimosis: Secondary | ICD-10-CM | POA: Diagnosis not present

## 2020-02-08 DIAGNOSIS — Z8551 Personal history of malignant neoplasm of bladder: Secondary | ICD-10-CM | POA: Diagnosis not present

## 2020-02-14 DIAGNOSIS — I2510 Atherosclerotic heart disease of native coronary artery without angina pectoris: Secondary | ICD-10-CM | POA: Diagnosis not present

## 2020-02-14 DIAGNOSIS — E785 Hyperlipidemia, unspecified: Secondary | ICD-10-CM | POA: Diagnosis not present

## 2020-02-14 DIAGNOSIS — I10 Essential (primary) hypertension: Secondary | ICD-10-CM | POA: Diagnosis not present

## 2020-02-17 DIAGNOSIS — Z23 Encounter for immunization: Secondary | ICD-10-CM | POA: Diagnosis not present

## 2020-02-17 DIAGNOSIS — M25512 Pain in left shoulder: Secondary | ICD-10-CM | POA: Diagnosis not present

## 2020-02-17 DIAGNOSIS — N1831 Chronic kidney disease, stage 3a: Secondary | ICD-10-CM | POA: Diagnosis not present

## 2020-02-17 DIAGNOSIS — R110 Nausea: Secondary | ICD-10-CM | POA: Diagnosis not present

## 2020-02-17 DIAGNOSIS — E1121 Type 2 diabetes mellitus with diabetic nephropathy: Secondary | ICD-10-CM | POA: Diagnosis not present

## 2020-02-17 DIAGNOSIS — I129 Hypertensive chronic kidney disease with stage 1 through stage 4 chronic kidney disease, or unspecified chronic kidney disease: Secondary | ICD-10-CM | POA: Diagnosis not present

## 2020-02-17 DIAGNOSIS — I714 Abdominal aortic aneurysm, without rupture: Secondary | ICD-10-CM | POA: Diagnosis not present

## 2020-02-17 DIAGNOSIS — Z7984 Long term (current) use of oral hypoglycemic drugs: Secondary | ICD-10-CM | POA: Diagnosis not present

## 2020-02-17 DIAGNOSIS — R269 Unspecified abnormalities of gait and mobility: Secondary | ICD-10-CM | POA: Diagnosis not present

## 2020-02-19 ENCOUNTER — Encounter (HOSPITAL_BASED_OUTPATIENT_CLINIC_OR_DEPARTMENT_OTHER): Payer: Self-pay | Admitting: Emergency Medicine

## 2020-02-19 ENCOUNTER — Emergency Department (HOSPITAL_BASED_OUTPATIENT_CLINIC_OR_DEPARTMENT_OTHER): Payer: Medicare Other

## 2020-02-19 ENCOUNTER — Emergency Department (HOSPITAL_BASED_OUTPATIENT_CLINIC_OR_DEPARTMENT_OTHER)
Admission: EM | Admit: 2020-02-19 | Discharge: 2020-02-19 | Disposition: A | Discharge status: Home / Self Care | Payer: Medicare Other | Attending: Emergency Medicine | Admitting: Emergency Medicine

## 2020-02-19 DIAGNOSIS — M47817 Spondylosis without myelopathy or radiculopathy, lumbosacral region: Secondary | ICD-10-CM | POA: Diagnosis not present

## 2020-02-19 DIAGNOSIS — K5900 Constipation, unspecified: Secondary | ICD-10-CM | POA: Diagnosis not present

## 2020-02-19 DIAGNOSIS — R10817 Generalized abdominal tenderness: Secondary | ICD-10-CM | POA: Diagnosis not present

## 2020-02-19 DIAGNOSIS — Z20822 Contact with and (suspected) exposure to covid-19: Secondary | ICD-10-CM | POA: Diagnosis not present

## 2020-02-19 DIAGNOSIS — E1140 Type 2 diabetes mellitus with diabetic neuropathy, unspecified: Secondary | ICD-10-CM | POA: Insufficient documentation

## 2020-02-19 DIAGNOSIS — K219 Gastro-esophageal reflux disease without esophagitis: Secondary | ICD-10-CM | POA: Insufficient documentation

## 2020-02-19 DIAGNOSIS — G20 Parkinson's disease: Secondary | ICD-10-CM | POA: Diagnosis not present

## 2020-02-19 DIAGNOSIS — Z79899 Other long term (current) drug therapy: Secondary | ICD-10-CM | POA: Insufficient documentation

## 2020-02-19 DIAGNOSIS — Z8554 Personal history of malignant neoplasm of ureter: Secondary | ICD-10-CM | POA: Insufficient documentation

## 2020-02-19 DIAGNOSIS — N281 Cyst of kidney, acquired: Secondary | ICD-10-CM | POA: Diagnosis not present

## 2020-02-19 DIAGNOSIS — K5790 Diverticulosis of intestine, part unspecified, without perforation or abscess without bleeding: Secondary | ICD-10-CM | POA: Diagnosis not present

## 2020-02-19 DIAGNOSIS — N183 Chronic kidney disease, stage 3 unspecified: Secondary | ICD-10-CM | POA: Diagnosis not present

## 2020-02-19 DIAGNOSIS — Z87891 Personal history of nicotine dependence: Secondary | ICD-10-CM | POA: Insufficient documentation

## 2020-02-19 DIAGNOSIS — I2510 Atherosclerotic heart disease of native coronary artery without angina pectoris: Secondary | ICD-10-CM | POA: Insufficient documentation

## 2020-02-19 DIAGNOSIS — R000 Tachycardia, unspecified: Secondary | ICD-10-CM | POA: Insufficient documentation

## 2020-02-19 DIAGNOSIS — Z794 Long term (current) use of insulin: Secondary | ICD-10-CM | POA: Insufficient documentation

## 2020-02-19 DIAGNOSIS — Z7984 Long term (current) use of oral hypoglycemic drugs: Secondary | ICD-10-CM | POA: Diagnosis not present

## 2020-02-19 DIAGNOSIS — Z7982 Long term (current) use of aspirin: Secondary | ICD-10-CM | POA: Insufficient documentation

## 2020-02-19 DIAGNOSIS — R911 Solitary pulmonary nodule: Secondary | ICD-10-CM

## 2020-02-19 DIAGNOSIS — I129 Hypertensive chronic kidney disease with stage 1 through stage 4 chronic kidney disease, or unspecified chronic kidney disease: Secondary | ICD-10-CM | POA: Insufficient documentation

## 2020-02-19 LAB — CBC WITH DIFFERENTIAL/PLATELET
Abs Immature Granulocytes: 0.08 10*3/uL — ABNORMAL HIGH (ref 0.00–0.07)
Basophils Absolute: 0.1 10*3/uL (ref 0.0–0.1)
Basophils Relative: 1 %
Eosinophils Absolute: 0.2 10*3/uL (ref 0.0–0.5)
Eosinophils Relative: 2 %
HCT: 48.2 % (ref 39.0–52.0)
Hemoglobin: 15.5 g/dL (ref 13.0–17.0)
Immature Granulocytes: 1 %
Lymphocytes Relative: 10 %
Lymphs Abs: 1.2 10*3/uL (ref 0.7–4.0)
MCH: 28.8 pg (ref 26.0–34.0)
MCHC: 32.2 g/dL (ref 30.0–36.0)
MCV: 89.4 fL (ref 80.0–100.0)
Monocytes Absolute: 0.9 10*3/uL (ref 0.1–1.0)
Monocytes Relative: 7 %
Neutro Abs: 9.5 10*3/uL — ABNORMAL HIGH (ref 1.7–7.7)
Neutrophils Relative %: 79 %
Platelets: 153 10*3/uL (ref 150–400)
RBC: 5.39 MIL/uL (ref 4.22–5.81)
RDW: 15.9 % — ABNORMAL HIGH (ref 11.5–15.5)
WBC: 11.9 10*3/uL — ABNORMAL HIGH (ref 4.0–10.5)
nRBC: 0 % (ref 0.0–0.2)

## 2020-02-19 LAB — COMPREHENSIVE METABOLIC PANEL
ALT: 44 U/L (ref 0–44)
AST: 42 U/L — ABNORMAL HIGH (ref 15–41)
Albumin: 4.1 g/dL (ref 3.5–5.0)
Alkaline Phosphatase: 45 U/L (ref 38–126)
Anion gap: 13 (ref 5–15)
BUN: 31 mg/dL — ABNORMAL HIGH (ref 8–23)
CO2: 24 mmol/L (ref 22–32)
Calcium: 9.7 mg/dL (ref 8.9–10.3)
Chloride: 102 mmol/L (ref 98–111)
Creatinine, Ser: 1.35 mg/dL — ABNORMAL HIGH (ref 0.61–1.24)
GFR, Estimated: 53 mL/min — ABNORMAL LOW (ref >60)
Glucose, Bld: 104 mg/dL — ABNORMAL HIGH (ref 70–99)
Potassium: 4.3 mmol/L (ref 3.5–5.1)
Sodium: 139 mmol/L (ref 135–145)
Total Bilirubin: 1.2 mg/dL (ref 0.3–1.2)
Total Protein: 7.6 g/dL (ref 6.5–8.1)

## 2020-02-19 LAB — LIPASE, BLOOD: Lipase: 39 U/L (ref 11–51)

## 2020-02-19 LAB — RESPIRATORY PANEL BY RT PCR (FLU A&B, COVID)
Influenza A by PCR: NEGATIVE
Influenza B by PCR: NEGATIVE
SARS Coronavirus 2 by RT PCR: NEGATIVE

## 2020-02-19 IMAGING — CT CT ABD-PELV W/O CM
2 of 4 series · 15 of 46 positions shown, 17 images · non-contrast
Comparison: [DATE]

CLINICAL DATA: Acute abdominal pain and constipation.

EXAM:
CT ABDOMEN AND PELVIS WITHOUT CONTRAST
TECHNIQUE: Multidetector CT imaging of the abdomen and pelvis was performed
following the standard protocol without IV contrast.

[Series 2: axial st · axial · 0.88mm/px · z∈[-516,+8]mm · 12 of 117 slices shown, 14 images]
[im 6/117  soft-tissue]
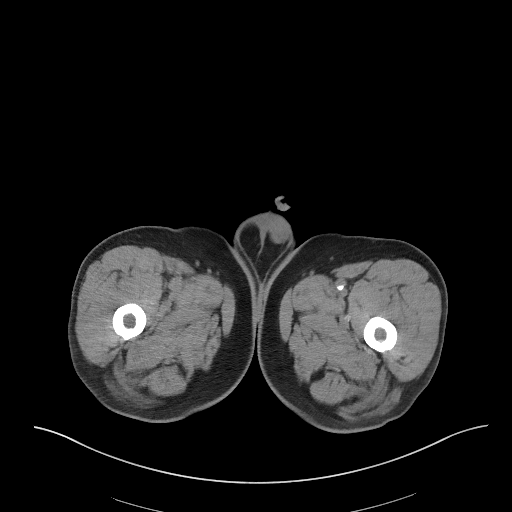
[im 6/117  bone]
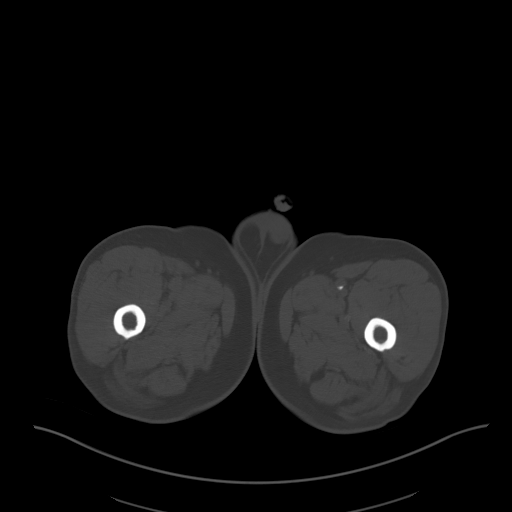
[im 16/117  soft-tissue]
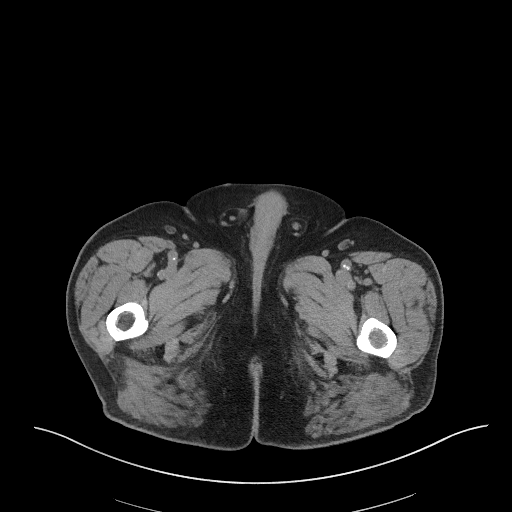
[im 26/117  soft-tissue]
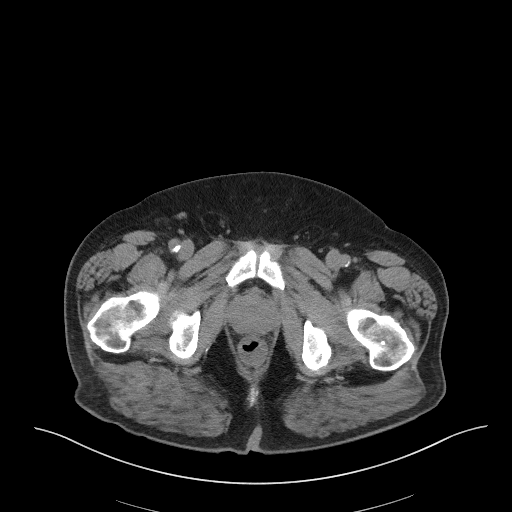
[im 36/117  soft-tissue]
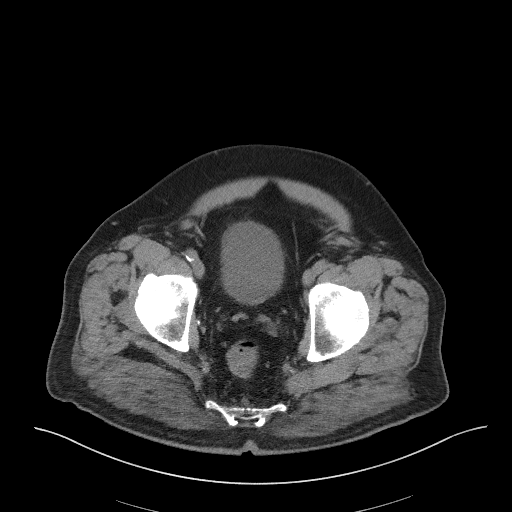
[im 46/117  soft-tissue]
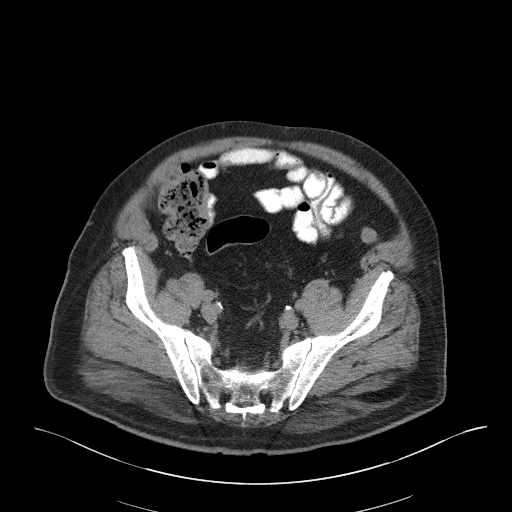
[im 56/117  soft-tissue]
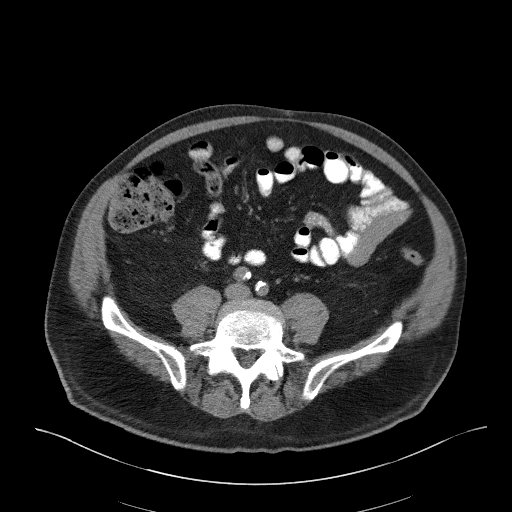
[im 61/117  soft-tissue]
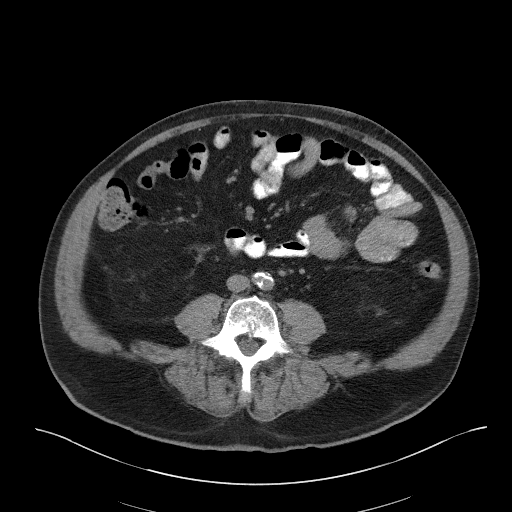
[im 71/117  soft-tissue]
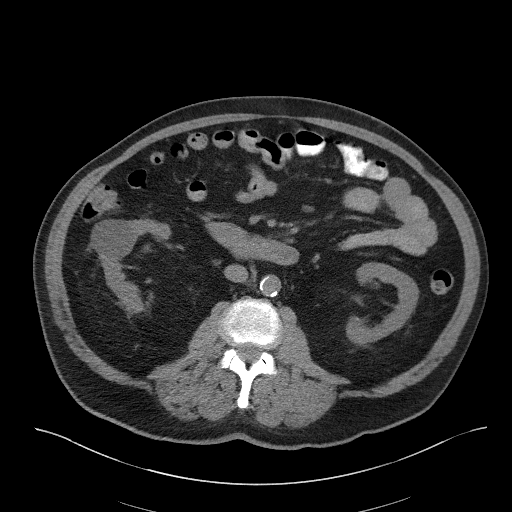
[im 81/117  soft-tissue]
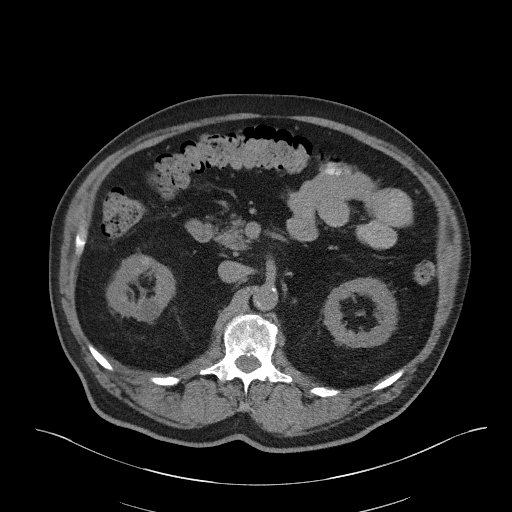
[im 81/117  bone]
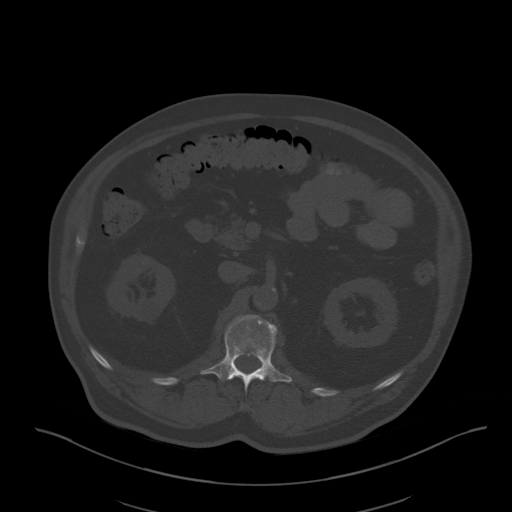
[im 91/117  soft-tissue]
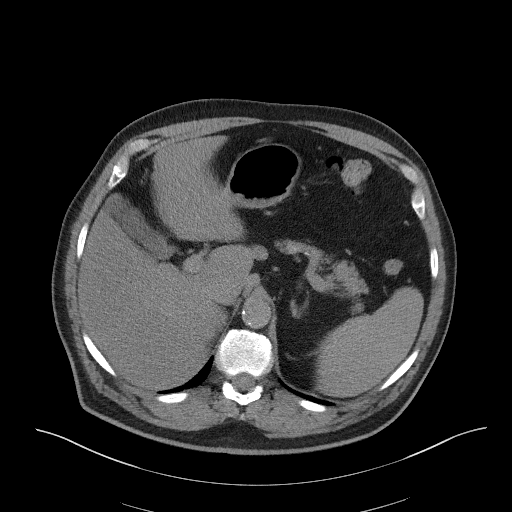
[im 101/117  soft-tissue]
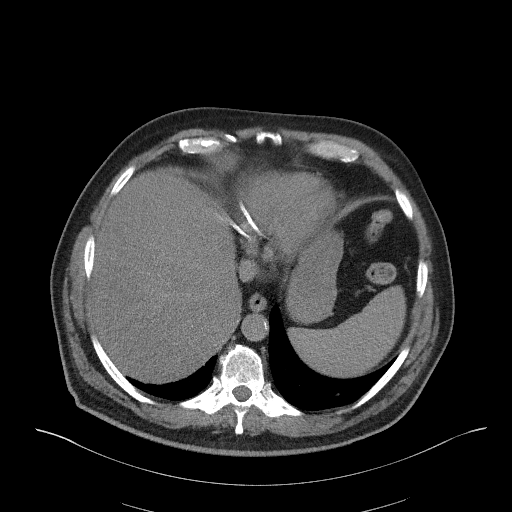
[im 111/117  soft-tissue]
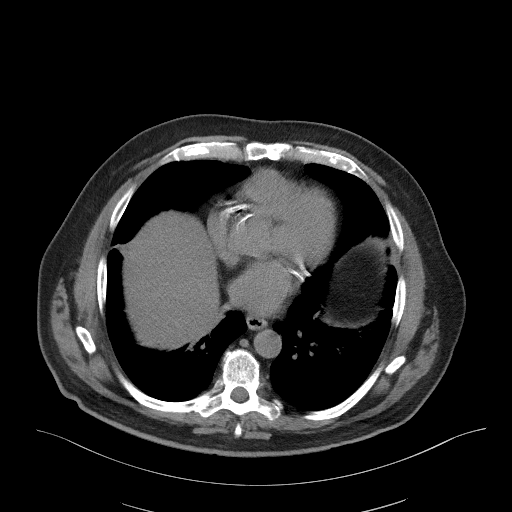

[Series 5: coronal st · coronal · 0.84mm/px · 3 of 110 slices shown]
[im 37/110  soft-tissue]
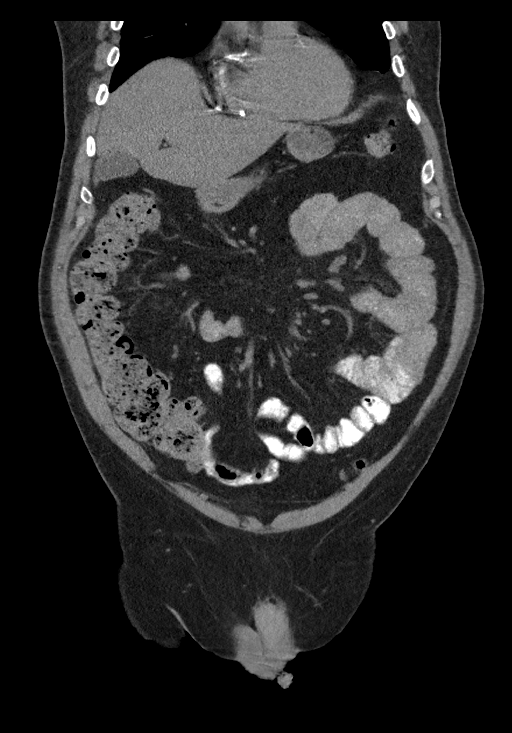
[im 49/110  soft-tissue]
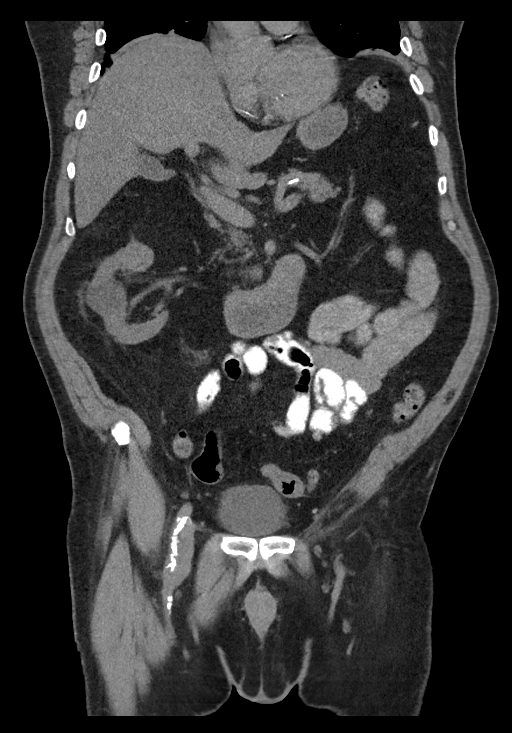
[im 61/110  soft-tissue]
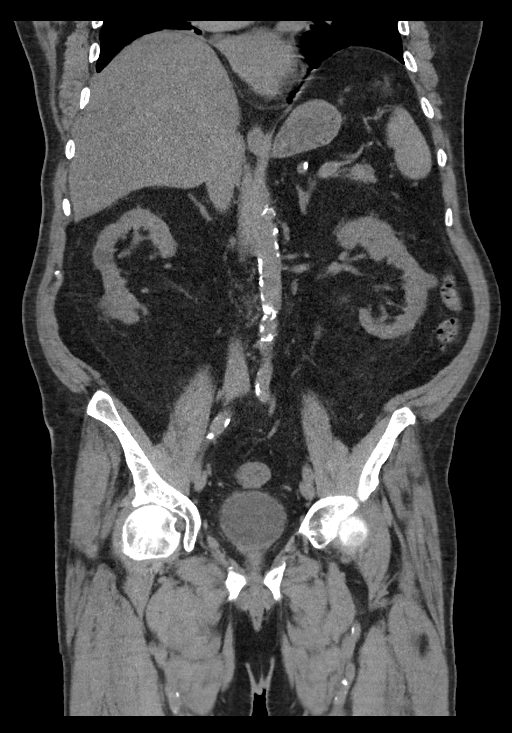

[15 of 46 positions shown; findings below may reference images not displayed]

FINDINGS: Lower chest: 7 ground-glass and solid pulmonary nodules in the left
lower lobe with predominant subpleural distribution. The dominant
nodule measures 1 cm, image 12/38,4. Calcific atherosclerotic
disease of the coronary arteries.

Hepatobiliary: No focal liver abnormality is seen. No gallstones,
gallbladder wall thickening, or biliary dilatation.

Pancreas: Unremarkable. No pancreatic ductal dilatation or
surrounding inflammatory changes.

Spleen: Normal in size without focal abnormality.

Adrenals/Urinary Tract: Bilateral cortical thinning. Bilateral renal
cysts. The dominant right midpole region cyst measures 3.4 cm.
Normal adrenal glands. Normal urinary bladder.

Stomach/Bowel: Stomach is within normal limits. Appendix appears
normal. No evidence of bowel wall thickening, distention, or
inflammatory changes. Mild stool burden. Scattered diverticulosis
without evidence of diverticulitis of the colon.

Vascular/Lymphatic: Aortic atherosclerosis. No enlarged abdominal or
pelvic lymph nodes.

Reproductive: Prostate is unremarkable.

Other: No abdominal wall hernia or abnormality. No abdominopelvic
ascites.

Musculoskeletal: Osteoarthritic changes of the lower lumbosacral
spine.
IMPRESSION: 1. No evidence of acute abnormalities within the abdomen or pelvis.
2. Scattered diverticulosis without evidence of diverticulitis.
3. Mild stool burden.
4. 7 ground-glass and solid pulmonary nodules in the left lower lobe
with predominant subpleural distribution. The dominant nodule
measures 1 cm. These were not present on the chest CT dated
[DATE], favoring infectious or inflammatory etiology.
Please correlate clinically. Non-contrast chest CT at 3-6 months is
recommended. If the nodules are stable at time of repeat CT, then
future CT at 18-24 months (from today's scan) is considered optional
for low-risk patients, but is recommended for high-risk patients.
This recommendation follows the consensus statement: Guidelines for
Management of Incidental Pulmonary Nodules Detected on CT Images:
5. Calcific atherosclerotic disease of the coronary arteries and
aorta.

Aortic Atherosclerosis ([Q4]-[Q4]).

## 2020-02-19 MED ORDER — SODIUM CHLORIDE 0.9 % IV BOLUS
500.0000 mL | Freq: Once | INTRAVENOUS | Status: AC
  Administered 2020-02-19: 500 mL via INTRAVENOUS

## 2020-02-19 MED ORDER — GLYCERIN (LAXATIVE) 2.1 G RE SUPP
1.0000 | Freq: Once | RECTAL | Status: AC
  Administered 2020-02-19: 1 via RECTAL
  Filled 2020-02-19: qty 1

## 2020-02-19 NOTE — ED Notes (Signed)
VS not obtained d/t pt insistence on leaving.

## 2020-02-19 NOTE — ED Notes (Signed)
Pt in CT.

## 2020-02-19 NOTE — ED Notes (Signed)
Pt had minimal results after suppository; small round piece of stool noted along with some liquid.

## 2020-02-19 NOTE — Discharge Instructions (Addendum)
Recommend noncontrast CT of the chest in 3 to 6 months to follow-up on left lower lobe lung nodules.  Use glycerin suppositories at home as needed as directed. Recheck with your primary care provider Return to ER for worsening or concerning symptoms.

## 2020-02-19 NOTE — ED Provider Notes (Signed)
[facility]ER[facility] DEPARTMENT Provider Note   CSN: [id number] Arrival date & time: 02/19/20  1200     History Chief Complaint  Patient presents with  . Constipation    Jason Hartman is a 84 y.o. male.  84 year old male with past medical history of diabetes, HTN, Parkinson's disease, stage III chronic kidney disease, urethral cancer, presents with complaint of abdominal pain with constipation.  Patient states that he has tried stool softeners, laxatives, enemas at home and is unable to pass stool for the past 3 days.  Reports nausea, is taking Zofran.  Denies fevers, chills.  No other complaints or concerns today.  Patient is a former smoker, no prior abdominal surgeries.        Past Medical History:  Diagnosis Date  . Cellulitis and abscess of leg, except foot   . Hypertension   . Obstructive sleep apnea (adult) (pediatric)   . Pain in joint, pelvic region and thigh   . Pneumonia, organism unspecified(486)   . Restless legs syndrome (RLS)   . RLS (restless legs syndrome) 09/01/2012  . Thoracic or lumbosacral neuritis or radiculitis, unspecified   . Type II or unspecified type diabetes mellitus without mention of complication, not stated as uncontrolled   . Unspecified disease of pericardium   . Unspecified hereditary and idiopathic peripheral neuropathy     Patient Active Problem List   Diagnosis Date Noted  . Recurrent UTI 07/16/2019  . Hypertensive urgency 04/26/2019  . Malignant HTN with heart disease, w/o CHF, w/o chronic kidney disease 04/26/2019  . Elevated troponin   . LFTs abnormal   . Statin intolerance 01/15/2019  . Penis pain 04/10/2018  . Urethra cancer ([place]) 02/25/2018  . Lower extremity edema 06/09/2017  . Peptic ulcer disease 05/16/2017  . GERD (gastroesophageal reflux disease) 05/16/2017  . Chronic kidney disease, stage 3 ([place]) 04/11/2017  . Lower urinary tract symptoms (LUTS) 04/15/2016  . Narcotic dependence ([place]) 08/31/2015  .  Imbalance 08/31/2015  . Diabetic peripheral neuropathy ([place]) 08/31/2015  . Other malaise and fatigue 05/02/2015  . Chronic fatigue 05/02/2015  . Panic disorder without agoraphobia 03/22/2015  . Hyperlipidemia 02/03/2014  . Thrombocytopenia ([place]) 08/18/2013  . Parkinson disease ([place]) 08/18/2013  . Other disorders of lung 08/18/2013  . Organic impotence 08/18/2013  . Memory loss 08/18/2013  . Low back pain 08/18/2013  . Iron deficiency anemia 08/18/2013  . Hypercalcemia 08/18/2013  . Cellulitis of right leg 08/18/2013  . Atherosclerotic heart disease of native coronary artery without angina pectoris 08/18/2013  . RLS (restless legs syndrome) 09/01/2012  . HERPES SIMPLEX INFECTION 11/06/2006  . DIABETES MELLITUS, TYPE II 11/06/2006  . HYPOGONADISM 11/06/2006  . VITAMIN D DEFICIENCY 11/06/2006  . ANXIETY 11/06/2006  . OBSTRUCTIVE SLEEP APNEA 11/06/2006  . RESTLESS LEG SYNDROME 11/06/2006  . HYPERTENSION 11/06/2006  . BENIGN PROSTATIC HYPERTROPHY 11/06/2006  . ROSACEA 11/06/2006  . OSTEOARTHRITIS 11/06/2006  . INSOMNIA 11/06/2006  . HYPERGLYCEMIA 11/06/2006  . COLONIC POLYPS, HX OF 11/06/2006    Past Surgical History:  Procedure Laterality Date  . ANAL FISSURE REPAIR    . pericarditis  2009       Family History  Problem Relation Age of Onset  . Diabetes Father     Social History   Tobacco Use  . Smoking status: Former Smoker    Quit date: 09/01/1980    Years since quitting: 39.4  . Smokeless tobacco: Never Used  Vaping Use  . Vaping Use: Never used  Substance Use Topics  .  Alcohol use: No  . Drug use: No    Home Medications Prior to Admission medications   Medication Sig Start Date End Date Taking? Authorizing Provider  acyclovir (ZOVIRAX) 800 MG tablet Take 800 mg by mouth daily as needed. 01/06/20   [provider]  allopurinol (ZYLOPRIM) 100 MG tablet Take 2 tablets daily. 02/03/17   Trula Slade, DPM  ALPRAZolam Duanne Moron) 0.5 MG tablet Take  0.5 mg by mouth 2 (two) times daily as needed for anxiety.  10/15/16   [provider]  amitriptyline (ELAVIL) 25 MG tablet  12/07/19   [provider]  aspirin 81 MG chewable tablet Chew by mouth.    [provider]  BD PEN NEEDLE NANO 2ND GEN 32G X 4 MM MISC FOR LANTUS AND VICTOZA PENS 11/30/19   [provider]  cetirizine (ZYRTEC) 10 MG tablet Take by mouth.    [provider]  chlorthalidone (HYGROTON) 25 MG tablet TAKE 1/2 TABLET BY MOUTH EVERY DAY IN THE MORNING WITH FOOD 07/16/19   [provider]  ciprofloxacin (CIPRO) 500 MG tablet Take 500 mg by mouth 2 (two) times daily. 07/16/19   [provider]  clotrimazole-betamethasone (LOTRISONE) cream Apply topically 2 (two) times daily. 11/03/19   [provider]  co-enzyme Q-10 50 MG capsule Take 50 mg by mouth daily.    [provider]  cyproheptadine (PERIACTIN) 4 MG tablet Take by mouth. 01/15/20   [provider]  diltiazem (CARDIZEM CD) 120 MG 24 hr capsule Take 120 mg by mouth daily.  10/19/18   [provider]  famotidine (PEPCID) 40 MG tablet Take 40 mg by mouth daily.  04/20/14   [provider]  ferrous sulfate 325 (65 FE) MG EC tablet Take 325 mg by mouth daily.  05/28/18   [provider]  fluconazole (DIFLUCAN) 200 MG tablet  05/19/19   [provider]  fluticasone (FLONASE) 50 MCG/ACT nasal spray Place 2 sprays into the nose daily.  04/11/14   [provider]  gabapentin (NEURONTIN) 600 MG tablet Take 2 tablets by mouth 3 (three) times daily. 01/07/20   [provider]  HYDROcodone-acetaminophen (NORCO/VICODIN) 5-325 MG tablet Take by mouth. 01/26/20 02/25/20  [provider]  imipramine (TOFRANIL) 10 MG tablet Take by mouth. 12/17/19   [provider]  insulin glargine (LANTUS SOLOSTAR) 100 UNIT/ML Solostar Pen Inject into the skin. 01/17/20   [provider]    L-Methylfolate-Algae-B12-B6 Glade [place]) 3-90.314-2-35 MG CAPS Take by mouth. 06/11/17   [provider]  liraglutide (VICTOZA) 18 MG/3ML SOPN Inject into the skin. 09/16/16   [provider]  magnesium citrate SOLN Take by mouth.    [provider]  metFORMIN (GLUCOPHAGE-XR) 500 MG 24 hr tablet Take by mouth. 01/17/20   [provider]  NEUPRO 8 MG/24HR PT24 Place 1 patch onto the skin daily. 04/27/19   Geradine Girt, DO  ofloxacin (OCUFLOX) 0.3 % ophthalmic solution INSTILL 1 TO 2 DROPS IN LEFT EYE FOUR TIMES DAILY FOR ACUTE OPIOID THERAPY DAYS 03/07/19   [provider]  Omega-3 Fatty Acids (FISH OIL) 1000 MG CAPS Take by mouth.    [provider]  OZEMPIC, 0.25 OR 0.5 MG/DOSE, 2 MG/1.5ML SOPN Inject into the skin. 01/17/20   [provider]  polyethylene glycol powder (GLYCOLAX/MIRALAX) 17 GM/SCOOP powder Take by mouth.    [provider]  predniSONE (DELTASONE) 50 MG tablet Take by mouth. 11/17/19   [provider]  primidone (MYSOLINE) 50 MG tablet Take by mouth. 01/24/20   [provider]  Semaglutide,0.25 or 0.5MG /DOS, (OZEMPIC, 0.25 OR 0.5 MG/DOSE,) 2 MG/1.5ML SOPN Inject into the skin. 01/17/20   [provider]  valsartan (DIOVAN) 320 MG tablet Take 320 mg by mouth daily.    [provider]    Allergies    Iodinated diagnostic agents, Iodine, Linezolid, Methadone hcl, Promethazine, Morphine sulfate, Oxycodone hcl, Penicillins, Quetiapine, Zolpidem, Atorvastatin, Carbidopa-levodopa, Clindamycin, Colesevelam, Doxazosin, Lovastatin, and Rosuvastatin  Review of Systems   Review of Systems  Constitutional: Negative for chills and fever.  Respiratory: Negative for shortness of breath.   Cardiovascular: Negative for chest pain.  Gastrointestinal: Positive for abdominal distention, abdominal pain, constipation and nausea. Negative for diarrhea and vomiting.  Genitourinary: Negative for  difficulty urinating and dysuria.  Musculoskeletal: Negative for arthralgias and myalgias.  Skin: Negative for wound.  Allergic/Immunologic: Positive for immunocompromised state.  Neurological: Negative for weakness.  Psychiatric/Behavioral: The patient is nervous/anxious.     Physical Exam Updated Vital Signs BP (!) 167/95   Pulse (!) 114   Temp 98.3 F (36.8 C) (Oral)   Resp (!) 24   Ht 5\' 9"  (1.753 m)   Wt 90.7 kg   SpO2 97%   BMI 29.53 kg/m   Physical Exam Vitals and nursing note reviewed.  Constitutional:      General: He is not in acute distress.    Appearance: He is well-developed. He is not diaphoretic.  HENT:     Head: Normocephalic and atraumatic.     Mouth/Throat:     Mouth: Mucous membranes are moist.  Cardiovascular:     Rate and Rhythm: Regular rhythm. Tachycardia present.     Pulses: Normal pulses.     Heart sounds: Normal heart sounds.  Pulmonary:     Effort: Pulmonary effort is normal.     Breath sounds: Normal breath sounds.  Abdominal:     General: Bowel sounds are increased. There is distension.     Palpations: Abdomen is soft.     Tenderness: There is generalized abdominal tenderness.  Skin:    General: Skin is warm and dry.     Findings: No erythema or rash.  Neurological:     Mental Status: He is alert and oriented to person, place, and time.  Psychiatric:        Mood and Affect: Mood is anxious.     ED Results / Procedures / Treatments   Labs (all labs ordered are listed, but only abnormal results are displayed) Labs Reviewed  COMPREHENSIVE METABOLIC PANEL - Abnormal; Notable for the following components:      Result Value   Glucose, Bld 104 (*)    BUN 31 (*)    Creatinine, Ser 1.35 (*)    AST 42 (*)    GFR, Estimated 53 (*)    All other components within normal limits  CBC WITH DIFFERENTIAL/PLATELET - Abnormal; Notable for the following components:   WBC 11.9 (*)    RDW 15.9 (*)    Neutro Abs 9.5 (*)    Abs Immature  Granulocytes 0.08 (*)    All other components within normal limits  RESPIRATORY PANEL BY RT PCR (FLU A&B, COVID)  LIPASE, BLOOD  URINALYSIS, ROUTINE W REFLEX MICROSCOPIC    EKG None  Radiology CT Abdomen Pelvis Wo Contrast  Result Date: 02/19/2020 CLINICAL DATA:  Acute abdominal pain and constipation. EXAM: CT ABDOMEN AND PELVIS WITHOUT CONTRAST TECHNIQUE: Multidetector CT imaging of the abdomen and  pelvis was performed following the standard protocol without IV contrast. COMPARISON:  April 26, 2019 FINDINGS: Lower chest: 7 ground-glass and solid pulmonary nodules in the left lower lobe with predominant subpleural distribution. The dominant nodule measures 1 cm, image 12/38,4. Calcific atherosclerotic disease of the coronary arteries. Hepatobiliary: No focal liver abnormality is seen. No gallstones, gallbladder wall thickening, or biliary dilatation. Pancreas: Unremarkable. No pancreatic ductal dilatation or surrounding inflammatory changes. Spleen: Normal in size without focal abnormality. Adrenals/Urinary Tract: Bilateral cortical thinning. Bilateral renal cysts. The dominant right midpole region cyst measures 3.4 cm. Normal adrenal glands. Normal urinary bladder. Stomach/Bowel: Stomach is within normal limits. Appendix appears normal. No evidence of bowel wall thickening, distention, or inflammatory changes. Mild stool burden. Scattered diverticulosis without evidence of diverticulitis of the colon. Vascular/Lymphatic: Aortic atherosclerosis. No enlarged abdominal or pelvic lymph nodes. Reproductive: Prostate is unremarkable. Other: No abdominal wall hernia or abnormality. No abdominopelvic ascites. Musculoskeletal: Osteoarthritic changes of the lower lumbosacral spine. IMPRESSION: 1. No evidence of acute abnormalities within the abdomen or pelvis. 2. Scattered diverticulosis without evidence of diverticulitis. 3. Mild stool burden. 4. 7 ground-glass and solid pulmonary nodules in the left lower  lobe with predominant subpleural distribution. The dominant nodule measures 1 cm. These were not present on the chest CT dated December 21, 2019, favoring infectious or inflammatory etiology. Please correlate clinically. Non-contrast chest CT at 3-6 months is recommended. If the nodules are stable at time of repeat CT, then future CT at 18-24 months (from today's scan) is considered optional for low-risk patients, but is recommended for high-risk patients. This recommendation follows the consensus statement: Guidelines for Management of Incidental Pulmonary Nodules Detected on CT Images: From the Fleischner Society 2017; Radiology 2017; 284:228-243. 5. Calcific atherosclerotic disease of the coronary arteries and aorta. Aortic Atherosclerosis (ICD10-I70.0). Electronically Signed   By: Fidela Salisbury M.D.   On: 02/19/2020 14:38    Procedures Procedures (including critical care time)  Medications Ordered in ED Medications  Glycerin (Adult) 2.1 g suppository 1 suppository (1 suppository Rectal Given 02/19/20 1300)  sodium chloride 0.9 % bolus 500 mL (0 mLs Intravenous Stopped 02/19/20 1526)    ED Course  I have reviewed the triage vital signs and the nursing notes.  Pertinent labs & imaging results that were available during my care of the patient were reviewed by me and considered in my medical decision making (see chart for details).  Clinical Course as of Feb 18 1534  Sat Feb 19, 7375  [id number] 84 year old male with complaint of abdominal pain with constipation. On exam, patient is very anxious, generalized abdominal tenderness with hyperactive bowel sounds. Glycerin suppository placed with relief.  Labs without significant change including CBC, CMP, lipase. CT with mild constipation, incidental left lower lung nodules, new from 9/21 scan, no history of recent illness. COVID test sent, discussed results, recommend repeat CT chest in 3-6 months per radiology recommendation. Patient requested  enema, no significant output. Rectal exam, no stool in rectal vault.   Recommend use glycerin suppositories at home as needed and follow up with PCP.   [LM]  1534 Patient was tachycardic and hypertensive throughout his stay, reports this is normal for him from anxiety.    [LM]    Clinical Course User Index [LM] Roque Lias   MDM Rules/Calculators/A&P                          Final Clinical Impression(s) / ED Diagnoses  Final diagnoses:  Constipation, unspecified constipation type  Lung nodule    Rx / DC Orders ED Discharge Orders    None       Roque Lias 02/19/20 1535    Isla Pence, MD 02/20/20 [id number]

## 2020-02-19 NOTE — ED Triage Notes (Signed)
Constipation x 3 days. Has tried miralax, stool softener, and a fleet enema.

## 2020-02-19 NOTE — ED Notes (Signed)
SSE given per order.  No stool noted out with enema.  Pink tinged fluid back out.  PA notified and at the bedside.

## 2020-02-28 DIAGNOSIS — N183 Chronic kidney disease, stage 3 unspecified: Secondary | ICD-10-CM | POA: Diagnosis not present

## 2020-02-28 DIAGNOSIS — N401 Enlarged prostate with lower urinary tract symptoms: Secondary | ICD-10-CM | POA: Diagnosis not present

## 2020-02-28 DIAGNOSIS — I129 Hypertensive chronic kidney disease with stage 1 through stage 4 chronic kidney disease, or unspecified chronic kidney disease: Secondary | ICD-10-CM | POA: Diagnosis not present

## 2020-02-28 DIAGNOSIS — N1831 Chronic kidney disease, stage 3a: Secondary | ICD-10-CM | POA: Diagnosis not present

## 2020-02-28 DIAGNOSIS — E1121 Type 2 diabetes mellitus with diabetic nephropathy: Secondary | ICD-10-CM | POA: Diagnosis not present

## 2020-03-01 DIAGNOSIS — K219 Gastro-esophageal reflux disease without esophagitis: Secondary | ICD-10-CM | POA: Diagnosis not present

## 2020-03-01 DIAGNOSIS — K5900 Constipation, unspecified: Secondary | ICD-10-CM | POA: Diagnosis not present

## 2020-03-01 DIAGNOSIS — R110 Nausea: Secondary | ICD-10-CM | POA: Diagnosis not present

## 2020-03-06 DIAGNOSIS — G20 Parkinson's disease: Secondary | ICD-10-CM | POA: Diagnosis not present

## 2020-03-06 DIAGNOSIS — Z8551 Personal history of malignant neoplasm of bladder: Secondary | ICD-10-CM | POA: Diagnosis not present

## 2020-03-06 DIAGNOSIS — I498 Other specified cardiac arrhythmias: Secondary | ICD-10-CM | POA: Diagnosis not present

## 2020-03-06 DIAGNOSIS — Z794 Long term (current) use of insulin: Secondary | ICD-10-CM | POA: Diagnosis not present

## 2020-03-06 DIAGNOSIS — Z955 Presence of coronary angioplasty implant and graft: Secondary | ICD-10-CM | POA: Diagnosis not present

## 2020-03-06 DIAGNOSIS — N183 Chronic kidney disease, stage 3 unspecified: Secondary | ICD-10-CM | POA: Diagnosis not present

## 2020-03-06 DIAGNOSIS — G4730 Sleep apnea, unspecified: Secondary | ICD-10-CM | POA: Diagnosis not present

## 2020-03-06 DIAGNOSIS — I2510 Atherosclerotic heart disease of native coronary artery without angina pectoris: Secondary | ICD-10-CM | POA: Diagnosis not present

## 2020-03-06 DIAGNOSIS — N35919 Unspecified urethral stricture, male, unspecified site: Secondary | ICD-10-CM | POA: Diagnosis not present

## 2020-03-06 DIAGNOSIS — I129 Hypertensive chronic kidney disease with stage 1 through stage 4 chronic kidney disease, or unspecified chronic kidney disease: Secondary | ICD-10-CM | POA: Diagnosis not present

## 2020-03-06 DIAGNOSIS — Z87891 Personal history of nicotine dependence: Secondary | ICD-10-CM | POA: Diagnosis not present

## 2020-03-06 DIAGNOSIS — E1122 Type 2 diabetes mellitus with diabetic chronic kidney disease: Secondary | ICD-10-CM | POA: Diagnosis not present

## 2020-03-06 DIAGNOSIS — N472 Paraphimosis: Secondary | ICD-10-CM | POA: Diagnosis not present

## 2020-03-10 DIAGNOSIS — Z4889 Encounter for other specified surgical aftercare: Secondary | ICD-10-CM | POA: Diagnosis not present

## 2020-03-10 DIAGNOSIS — C675 Malignant neoplasm of bladder neck: Secondary | ICD-10-CM | POA: Diagnosis not present

## 2020-03-10 DIAGNOSIS — E291 Testicular hypofunction: Secondary | ICD-10-CM | POA: Diagnosis not present

## 2020-03-15 DIAGNOSIS — R0989 Other specified symptoms and signs involving the circulatory and respiratory systems: Secondary | ICD-10-CM | POA: Diagnosis not present

## 2020-03-15 DIAGNOSIS — G4733 Obstructive sleep apnea (adult) (pediatric): Secondary | ICD-10-CM | POA: Diagnosis not present

## 2020-03-17 ENCOUNTER — Ambulatory Visit (INDEPENDENT_AMBULATORY_CARE_PROVIDER_SITE_OTHER): Payer: Medicare Other | Admitting: Podiatry

## 2020-03-17 ENCOUNTER — Other Ambulatory Visit: Payer: Self-pay

## 2020-03-17 DIAGNOSIS — M79675 Pain in left toe(s): Secondary | ICD-10-CM

## 2020-03-17 DIAGNOSIS — I714 Abdominal aortic aneurysm, without rupture, unspecified: Secondary | ICD-10-CM | POA: Insufficient documentation

## 2020-03-17 DIAGNOSIS — E1149 Type 2 diabetes mellitus with other diabetic neurological complication: Secondary | ICD-10-CM

## 2020-03-17 DIAGNOSIS — I77810 Thoracic aortic ectasia: Secondary | ICD-10-CM | POA: Insufficient documentation

## 2020-03-17 DIAGNOSIS — B351 Tinea unguium: Secondary | ICD-10-CM | POA: Diagnosis not present

## 2020-03-17 DIAGNOSIS — M79674 Pain in right toe(s): Secondary | ICD-10-CM

## 2020-03-17 DIAGNOSIS — E1142 Type 2 diabetes mellitus with diabetic polyneuropathy: Secondary | ICD-10-CM

## 2020-03-17 DIAGNOSIS — E1121 Type 2 diabetes mellitus with diabetic nephropathy: Secondary | ICD-10-CM | POA: Insufficient documentation

## 2020-03-17 DIAGNOSIS — R269 Unspecified abnormalities of gait and mobility: Secondary | ICD-10-CM | POA: Insufficient documentation

## 2020-03-17 DIAGNOSIS — L84 Corns and callosities: Secondary | ICD-10-CM

## 2020-03-23 ENCOUNTER — Encounter: Payer: Self-pay | Admitting: Podiatry

## 2020-03-23 NOTE — Progress Notes (Signed)
Subjective: Jason Hartman presents today at risk foot care with history of diabetic neuropathy and painful callus(es) right hallux and left foot and painful thick toenails that are difficult to trim. Pain interferes with ambulation. Aggravating factors include wearing enclosed shoe gear. Pain is relieved with periodic professional debridement.   His neuropathy pain is managed with gabapentin.  His wife is present during today's visit.    Jason Manes, Jason Hartman is patient's PCP. Last visit was: 02/28/2020.  Past Medical History:  Diagnosis Date  . Cellulitis and abscess of leg, except foot   . Hypertension   . Obstructive sleep apnea (adult) (pediatric)   . Pain in joint, pelvic region and thigh   . Pneumonia, organism unspecified(486)   . Restless legs syndrome (RLS)   . RLS (restless legs syndrome) 09/01/2012  . Thoracic or lumbosacral neuritis or radiculitis, unspecified   . Type II or unspecified type diabetes mellitus without mention of complication, not stated as uncontrolled   . Unspecified disease of pericardium   . Unspecified hereditary and idiopathic peripheral neuropathy      Patient Active Problem List   Diagnosis Date Noted  . Abdominal aortic aneurysm without rupture ([place]) 03/17/2020  . Abnormal gait 03/17/2020  . Ascending aorta dilatation ([facility]) 03/17/2020  . Diabetic renal disease ([place]) 03/17/2020  . Recurrent UTI 07/16/2019  . Hypertensive urgency 04/26/2019  . Malignant HTN with heart disease, w/o CHF, w/o chronic kidney disease 04/26/2019  . Elevated troponin   . LFTs abnormal   . Statin intolerance 01/15/2019  . Penis pain 04/10/2018  . Urethra cancer ([place]) 02/25/2018  . Lower extremity edema 06/09/2017  . Peptic ulcer disease 05/16/2017  . GERD (gastroesophageal reflux disease) 05/16/2017  . Chronic kidney disease, stage 3 ([place]) 04/11/2017  . Lower urinary tract symptoms (LUTS) 04/15/2016  . Narcotic dependence ([place]) 08/31/2015  . Imbalance 08/31/2015  .  Diabetic peripheral neuropathy ([place]) 08/31/2015  . Other malaise and fatigue 05/02/2015  . Chronic fatigue 05/02/2015  . Panic disorder without agoraphobia 03/22/2015  . Hyperlipidemia 02/03/2014  . Thrombocytopenia ([place]) 08/18/2013  . Parkinson disease ([place]) 08/18/2013  . Other disorders of lung 08/18/2013  . Organic impotence 08/18/2013  . Memory loss 08/18/2013  . Low back pain 08/18/2013  . Iron deficiency anemia 08/18/2013  . Hypercalcemia 08/18/2013  . Cellulitis of right leg 08/18/2013  . Atherosclerotic heart disease of native coronary artery without angina pectoris 08/18/2013  . RLS (restless legs syndrome) 09/01/2012  . HERPES SIMPLEX INFECTION 11/06/2006  . DIABETES MELLITUS, TYPE II 11/06/2006  . HYPOGONADISM 11/06/2006  . VITAMIN D DEFICIENCY 11/06/2006  . ANXIETY 11/06/2006  . OBSTRUCTIVE SLEEP APNEA 11/06/2006  . RESTLESS LEG SYNDROME 11/06/2006  . HYPERTENSION 11/06/2006  . BENIGN PROSTATIC HYPERTROPHY 11/06/2006  . ROSACEA 11/06/2006  . OSTEOARTHRITIS 11/06/2006  . INSOMNIA 11/06/2006  . HYPERGLYCEMIA 11/06/2006  . COLONIC POLYPS, HX OF 11/06/2006    Current Outpatient Medications on File Prior to Visit  Medication Sig Dispense Refill  . acyclovir (ZOVIRAX) 800 MG tablet Take 800 mg by mouth daily as needed.    Marland Kitchen allopurinol (ZYLOPRIM) 100 MG tablet Take 2 tablets daily. 60 tablet 0  . ALPRAZolam (XANAX) 0.5 MG tablet Take 0.5 mg by mouth 2 (two) times daily as needed for anxiety.     Marland Kitchen amitriptyline (ELAVIL) 25 MG tablet     . aspirin 81 MG chewable tablet Chew by mouth.    . BD PEN NEEDLE NANO 2ND GEN 32G X 4 MM MISC FOR  LANTUS AND VICTOZA PENS    . cetirizine (ZYRTEC) 10 MG tablet Take by mouth.    . chlorthalidone (HYGROTON) 25 MG tablet TAKE 1/2 TABLET BY MOUTH EVERY DAY IN THE MORNING WITH FOOD    . ciprofloxacin (CIPRO) 500 MG tablet Take 500 mg by mouth 2 (two) times daily.    . clotrimazole-betamethasone (LOTRISONE) cream Apply topically 2 (two)  times daily.    Marland Kitchen co-enzyme Q-10 50 MG capsule Take 50 mg by mouth daily.    . cyproheptadine (PERIACTIN) 4 MG tablet Take by mouth.    . diltiazem (CARDIZEM CD) 120 MG 24 hr capsule Take 120 mg by mouth daily.     . famotidine (PEPCID) 40 MG tablet Take 40 mg by mouth daily.     . ferrous sulfate 325 (65 FE) MG EC tablet Take 325 mg by mouth daily.     . fluconazole (DIFLUCAN) 200 MG tablet     . fluticasone (FLONASE) 50 MCG/ACT nasal spray Place 2 sprays into the nose daily.     Marland Kitchen gabapentin (NEURONTIN) 600 MG tablet Take 2 tablets by mouth 3 (three) times daily.    Marland Kitchen imipramine (TOFRANIL) 10 MG tablet Take by mouth.    . insulin glargine (LANTUS SOLOSTAR) 100 UNIT/ML Solostar Pen Inject into the skin.    Marland Kitchen L-Methylfolate-Algae-B12-B6 (METANX) 3-90.314-2-35 MG CAPS Take by mouth.    . liraglutide (VICTOZA) 18 MG/3ML SOPN Inject into the skin.    . magnesium citrate SOLN Take by mouth.    . metFORMIN (GLUCOPHAGE-XR) 500 MG 24 hr tablet Take by mouth.    . NEUPRO 8 MG/24HR PT24 Place 1 patch onto the skin daily. 30 patch   . ofloxacin (OCUFLOX) 0.3 % ophthalmic solution INSTILL 1 TO 2 DROPS IN LEFT EYE FOUR TIMES DAILY FOR ACUTE OPIOID THERAPY DAYS    . Omega-3 Fatty Acids (FISH OIL) 1000 MG CAPS Take by mouth.    Marland Kitchen OZEMPIC, 0.25 OR 0.5 MG/DOSE, 2 MG/1.5ML SOPN Inject into the skin.    . polyethylene glycol powder (GLYCOLAX/MIRALAX) 17 GM/SCOOP powder Take by mouth.    . predniSONE (DELTASONE) 50 MG tablet Take by mouth.    . primidone (MYSOLINE) 50 MG tablet Take by mouth.    . Semaglutide,0.25 or 0.5MG /DOS, (OZEMPIC, 0.25 OR 0.5 MG/DOSE,) 2 MG/1.5ML SOPN Inject into the skin.    . valsartan (DIOVAN) 320 MG tablet Take 320 mg by mouth daily.     No current facility-administered medications on file prior to visit.     Allergies  Allergen Reactions  . Iodinated Diagnostic Agents Hives    alleric to renografin,isovue & omnipaque, hives, requires 13 hr prep//a.calhoun, Onset Date:  [id number]     . Iodine Hives and Rash    alleric to renografin,isovue & omnipaque, hives, requires 13 hr prep//a.calhoun, Onset Date: [id number]  . Linezolid Hives and Rash       . Methadone Hcl Other (See Comments)    HALLUCINATIONS   . Promethazine Other (See Comments)    Mental status change, DELIRIUM    . Morphine Sulfate Other (See Comments)    Pt not sure about allergy   . Oxycodone Hcl Other (See Comments)    Pt not sure about allergy   . Penicillins Other (See Comments)    Pt not sure about allergy   . Quetiapine Other (See Comments)    Pt not sure about allergy   . Zolpidem Other (See Comments)  . Atorvastatin Itching and Other (See  Comments)    Tired, weakness   . Carbidopa-Levodopa Anxiety  . Clindamycin Rash  . Colesevelam Other (See Comments)    tired   . Doxazosin Rash  . Lovastatin Other (See Comments)    Tired, nervousness   . Rosuvastatin Other (See Comments)    Tired, weakness     Objective: Jason Hartman is a pleasant 84 y.o. Caucasian male obese in NAD. AAO x 3.  There were no vitals filed for this visit.  Vascular Examination: Capillary refill time to digits immediate b/l. Palpable pedal pulses b/l LE. Pedal hair present. Lower extremity skin temperature gradient within normal limits. No pain with calf compression b/l. No edema noted b/l lower extremities.  Dermatological Examination: Pedal skin with normal turgor, texture and tone bilaterally. No open wounds bilaterally. No interdigital macerations bilaterally. Toenails 1-5 b/l elongated, discolored, dystrophic, thickened, crumbly with subungual debris and tenderness to dorsal palpation. Hyperkeratotic lesion(s) R hallux and submet head 5 left foot.  No erythema, no edema, no drainage, no flocculence.  Musculoskeletal: Normal muscle strength 5/5 to all lower extremity muscle groups bilaterally. No pain crepitus or joint limitation noted with ROM b/l. No gross bony deformities bilaterally. Uses  walking stick for ambulation assistance.  Neurological Examination: Pt has subjective symptoms of neuropathy. Protective sensation diminished with 10g monofilament b/l. Vibratory sensation decreased b/l. Clonus negative b/l.  Last A1c: Hemoglobin A1C Latest Ref Rng & Units 04/26/2019  HGBA1C 4.8 - 5.6 % 9.1(H)  Some recent data might be hidden   Assessment: 1. Pain due to onychomycosis of toenails of both feet   2. Callus   3. Diabetic peripheral neuropathy associated with type 2 diabetes mellitus (Jason Hartman)   4. Type II diabetes mellitus with neurological manifestations (Jason Hartman)    Plan: -Examined patient. -No new findings. No new orders. -Continue diabetic foot care principles. -Toenails 1-5 b/l were debrided in length and girth with sterile nail nippers and dremel without iatrogenic bleeding.  -Callus(es) R hallux and submet head 5 left foot pared utilizing sterile scalpel blade without complication or incident. Total number debrided =2. -Patient to report any pedal injuries to medical professional immediately. -Patient to continue soft, supportive shoe gear daily. -Patient/POA to call should there be question/concern in the interim.  Return in 5 weeks for cash pay diabetic foot care.   Marzetta Board, DPM

## 2020-04-13 DIAGNOSIS — C675 Malignant neoplasm of bladder neck: Secondary | ICD-10-CM | POA: Diagnosis not present

## 2020-04-13 DIAGNOSIS — E291 Testicular hypofunction: Secondary | ICD-10-CM | POA: Diagnosis not present

## 2020-04-18 ENCOUNTER — Ambulatory Visit (INDEPENDENT_AMBULATORY_CARE_PROVIDER_SITE_OTHER): Payer: Medicare Other | Admitting: Podiatry

## 2020-04-18 ENCOUNTER — Other Ambulatory Visit: Payer: Self-pay

## 2020-04-18 ENCOUNTER — Encounter: Payer: Self-pay | Admitting: Podiatry

## 2020-04-18 DIAGNOSIS — M79674 Pain in right toe(s): Secondary | ICD-10-CM

## 2020-04-18 DIAGNOSIS — E1142 Type 2 diabetes mellitus with diabetic polyneuropathy: Secondary | ICD-10-CM | POA: Diagnosis not present

## 2020-04-18 DIAGNOSIS — M79675 Pain in left toe(s): Secondary | ICD-10-CM

## 2020-04-18 DIAGNOSIS — L84 Corns and callosities: Secondary | ICD-10-CM | POA: Diagnosis not present

## 2020-04-18 DIAGNOSIS — B351 Tinea unguium: Secondary | ICD-10-CM | POA: Diagnosis not present

## 2020-04-22 NOTE — Progress Notes (Signed)
Subjective: Jason Hartman presents today at risk foot care with history of diabetic neuropathy and painful callus(es) right hallux and left foot and painful thick toenails that are difficult to trim. Pain interferes with ambulation. Aggravating factors include wearing enclosed shoe gear. Pain is relieved with periodic professional debridement.  His wife is present during today's visit.    [name], MD is patient's PCP. Last visit was: 02/28/2020.  Past Medical History:  Diagnosis Date  . Cellulitis and abscess of leg, except foot   . Hypertension   . Obstructive sleep apnea (adult) (pediatric)   . Pain in joint, pelvic region and thigh   . Pneumonia, organism unspecified(486)   . Restless legs syndrome (RLS)   . RLS (restless legs syndrome) 09/01/2012  . Thoracic or lumbosacral neuritis or radiculitis, unspecified   . Type II or unspecified type diabetes mellitus without mention of complication, not stated as uncontrolled   . Unspecified disease of pericardium   . Unspecified hereditary and idiopathic peripheral neuropathy      Patient Active Problem List   Diagnosis Date Noted  . Abdominal aortic aneurysm without rupture ([place]) 03/17/2020  . Abnormal gait 03/17/2020  . Ascending aorta dilatation (H[facility]) 03/17/2020  . Diabetic renal disease ([place]) 03/17/2020  . Recurrent UTI 07/16/2019  . Hypertensive urgency 04/26/2019  . Malignant HTN with heart disease, w/o CHF, w/o chronic kidney disease 04/26/2019  . Elevated troponin   . LFTs abnormal   . Statin intolerance 01/15/2019  . Penis pain 04/10/2018  . Urethra cancer ([place]) 02/25/2018  . Lower extremity edema 06/09/2017  . Peptic ulcer disease 05/16/2017  . GERD (gastroesophageal reflux disease) 05/16/2017  . Chronic kidney disease, stage 3 ([place]) 04/11/2017  . Lower urinary tract symptoms (LUTS) 04/15/2016  . Narcotic dependence ([place]) 08/31/2015  . Imbalance 08/31/2015  . Diabetic peripheral neuropathy ([place]) 08/31/2015  .  Other malaise and fatigue 05/02/2015  . Chronic fatigue 05/02/2015  . Panic disorder without agoraphobia 03/22/2015  . Hyperlipidemia 02/03/2014  . Thrombocytopenia ([place]) 08/18/2013  . Parkinson disease ([place]) 08/18/2013  . Other disorders of lung 08/18/2013  . Organic impotence 08/18/2013  . Memory loss 08/18/2013  . Low back pain 08/18/2013  . Iron deficiency anemia 08/18/2013  . Hypercalcemia 08/18/2013  . Cellulitis of right leg 08/18/2013  . Atherosclerotic heart disease of native coronary artery without angina pectoris 08/18/2013  . RLS (restless legs syndrome) 09/01/2012  . HERPES SIMPLEX INFECTION 11/06/2006  . DIABETES MELLITUS, TYPE II 11/06/2006  . HYPOGONADISM 11/06/2006  . VITAMIN D DEFICIENCY 11/06/2006  . ANXIETY 11/06/2006  . OBSTRUCTIVE SLEEP APNEA 11/06/2006  . RESTLESS LEG SYNDROME 11/06/2006  . HYPERTENSION 11/06/2006  . BENIGN PROSTATIC HYPERTROPHY 11/06/2006  . ROSACEA 11/06/2006  . OSTEOARTHRITIS 11/06/2006  . INSOMNIA 11/06/2006  . HYPERGLYCEMIA 11/06/2006  . COLONIC POLYPS, HX OF 11/06/2006    Current Outpatient Medications on File Prior to Visit  Medication Sig Dispense Refill  . diltiazem (CARDIZEM CD) 120 MG 24 hr capsule 1 capsule    . gabapentin (NEURONTIN) 300 MG capsule 2 tablet 2    . metFORMIN (GLUCOPHAGE-XR) 500 MG 24 hr tablet 1 tablet    . oxybutynin (DITROPAN) 5 MG tablet Take by mouth.    . tadalafil (CIALIS) 5 MG tablet Take 1 tablet by mouth daily.    Marland Kitchen acetaminophen (ACETAMINOPHEN EXTRA STRENGTH) 500 MG tablet 1 tablet as needed    . acyclovir (ZOVIRAX) 800 MG tablet Take 800 mg by mouth daily as needed.    Marland Kitchen  allopurinol (ZYLOPRIM) 100 MG tablet Take 2 tablets daily. 60 tablet 0  . ALPRAZolam (XANAX) 0.5 MG tablet Take 0.5 mg by mouth 2 (two) times daily as needed for anxiety.     Marland Kitchen amitriptyline (ELAVIL) 25 MG tablet     . aspirin (ASPIRIN ADULT LOW DOSE) 81 MG EC tablet 1 tablet    . BD PEN NEEDLE NANO 2ND GEN 32G X 4 MM MISC FOR  LANTUS AND VICTOZA PENS    . cetirizine (ZYRTEC ALLERGY) 10 MG tablet 1 tablet    . chlorthalidone (HYGROTON) 25 MG tablet TAKE 1/2 TABLET BY MOUTH EVERY DAY IN THE MORNING WITH FOOD    . ciprofloxacin (CIPRO) 500 MG tablet Take 500 mg by mouth 2 (two) times daily.    . clotrimazole-betamethasone (LOTRISONE) cream Apply topically 2 (two) times daily.    Marland Kitchen co-enzyme Q-10 50 MG capsule Take 50 mg by mouth daily.    . cyproheptadine (PERIACTIN) 4 MG tablet Take by mouth.    . famotidine (PEPCID) 40 MG tablet Take 40 mg by mouth daily.     . ferrous sulfate 325 (65 FE) MG EC tablet Take 325 mg by mouth daily.     . fluconazole (DIFLUCAN) 200 MG tablet     . fluticasone (FLONASE ALLERGY RELIEF) 50 MCG/ACT nasal spray 2 spray in each nostril    . HYDROcodone-acetaminophen (NORCO) 7.5-325 MG tablet 1 tablet as needed    . imipramine (TOFRANIL) 10 MG tablet Take by mouth.    . insulin glargine (LANTUS) 100 UNIT/ML injection 90 units    . L-Methylfolate-Algae-B12-B6 (METANX) 3-90.314-2-35 MG CAPS Take by mouth.    . liraglutide (VICTOZA) 18 MG/3ML SOPN Inject into the skin.    . magnesium citrate SOLN Take by mouth.    Marland Kitchen ofloxacin (OCUFLOX) 0.3 % ophthalmic solution INSTILL 1 TO 2 DROPS IN LEFT EYE FOUR TIMES DAILY FOR ACUTE OPIOID THERAPY DAYS    . Omega-3 Fatty Acids (FISH OIL) 1000 MG CAPS Take by mouth.    Marland Kitchen omeprazole (PRILOSEC) 40 MG capsule Take 40 mg by mouth daily.    . ondansetron (ZOFRAN-ODT) 4 MG disintegrating tablet Take 4 mg by mouth every 8 (eight) hours as needed.    Marland Kitchen OZEMPIC, 0.25 OR 0.5 MG/DOSE, 2 MG/1.5ML SOPN Inject into the skin.    . polyethylene glycol powder (MIRALAX) 17 GM/SCOOP powder 1 capful    . predniSONE (DELTASONE) 50 MG tablet Take by mouth.    . primidone (MYSOLINE) 50 MG tablet Take by mouth.    . Rotigotine (NEUPRO) 8 MG/24HR PT24 1 patch to skin    . Semaglutide,0.25 or 0.5MG /DOS, (OZEMPIC, 0.25 OR 0.5 MG/DOSE,) 2 MG/1.5ML SOPN Inject into the skin.    .  Semaglutide,0.25 or 0.5MG /DOS, (OZEMPIC, 0.25 OR 0.5 MG/DOSE,) 2 MG/1.5ML SOPN Titrate 0.25 mg once weekly for 4 weeks then 0.5 mg once weekly for 2 weeks    . sulfamethoxazole-trimethoprim (BACTRIM DS) 800-160 MG tablet Take 1 tablet by mouth 2 (two) times daily.    . valsartan (DIOVAN) 320 MG tablet 1 tablet     No current facility-administered medications on file prior to visit.     Allergies  Allergen Reactions  . Iodinated Diagnostic Agents Hives    alleric to renografin,isovue & omnipaque, hives, requires 13 hr prep//a.calhoun, Onset Date: [id number]     . Iodine Hives and Rash    alleric to renografin,isovue & omnipaque, hives, requires 13 hr prep//a.calhoun, Onset Date: [id number]  . Linezolid Hives  and Rash       . Methadone Hcl Other (See Comments)    HALLUCINATIONS   . Promethazine Other (See Comments)    Mental status change, DELIRIUM   Other reaction(s): erratic behavior  . Morphine Sulfate Other (See Comments)    Pt not sure about allergy   . Oxycodone Hcl Other (See Comments)    Pt not sure about allergy   . Penicillins Other (See Comments)    Pt not sure about allergy   . Quetiapine Other (See Comments)    Pt not sure about allergy   . Statins     Other reaction(s): muscle weakness  . Zolpidem Other (See Comments)    Other reaction(s): erratic behanvior  . Atorvastatin Itching and Other (See Comments)    Tired, weakness   . Carbidopa-Levodopa Anxiety  . Clindamycin Rash  . Colesevelam Other (See Comments)    tired   . Doxazosin Rash  . Lovastatin Other (See Comments)    Tired, nervousness   . Rosuvastatin Other (See Comments)    Tired, weakness     Objective: CLAUDIA TEWKSBURY is a pleasant 85 y.o. Caucasian male obese in NAD. AAO x 3.  There were no vitals filed for this visit.  Vascular Examination: Capillary refill time to digits immediate b/l. Palpable pedal pulses b/l LE. Pedal hair present. Lower extremity skin temperature gradient within  normal limits. No pain with calf compression b/l. No edema noted b/l lower extremities.  Dermatological Examination: Pedal skin with normal turgor, texture and tone bilaterally. No open wounds bilaterally. No interdigital macerations bilaterally. Toenails 1-5 b/l elongated, discolored, dystrophic, thickened, crumbly with subungual debris and tenderness to dorsal palpation. Hyperkeratotic lesion(s) R hallux and submet head 5 left foot.  No erythema, no edema, no drainage, no flocculence.  Musculoskeletal: Normal muscle strength 5/5 to all lower extremity muscle groups bilaterally. No pain crepitus or joint limitation noted with ROM b/l. No gross bony deformities bilaterally. Uses walking stick for ambulation assistance.  Neurological Examination: Pt has subjective symptoms of neuropathy. Protective sensation diminished with 10g monofilament b/l. Vibratory sensation decreased b/l. Clonus negative b/l.  Last A1c: Hemoglobin A1C Latest Ref Rng & Units 04/26/2019  HGBA1C 4.8 - 5.6 % 9.1(H)  Some recent data might be hidden   Assessment: 1. Pain due to onychomycosis of toenails of both feet   2. Callus   3. Diabetic peripheral neuropathy associated with type 2 diabetes mellitus ([place])    Plan: -Examined patient. -No new findings. No new orders. -Continue diabetic foot care principles. -Toenails 1-5 b/l were debrided in length and girth with sterile nail nippers and dremel without iatrogenic bleeding.  -Callus(es) R hallux and submet head 5 left foot pared utilizing sterile scalpel blade without complication or incident. Total number debrided =2. -Patient to report any pedal injuries to medical professional immediately. -Patient to continue soft, supportive shoe gear daily. -Patient/POA to call should there be question/concern in the interim.  Return in 5 weeks.   Marzetta Board, DPM

## 2020-05-05 DIAGNOSIS — E291 Testicular hypofunction: Secondary | ICD-10-CM | POA: Diagnosis not present

## 2020-05-08 DIAGNOSIS — N401 Enlarged prostate with lower urinary tract symptoms: Secondary | ICD-10-CM | POA: Diagnosis not present

## 2020-05-08 DIAGNOSIS — E1121 Type 2 diabetes mellitus with diabetic nephropathy: Secondary | ICD-10-CM | POA: Diagnosis not present

## 2020-05-08 DIAGNOSIS — N1831 Chronic kidney disease, stage 3a: Secondary | ICD-10-CM | POA: Diagnosis not present

## 2020-05-08 DIAGNOSIS — I129 Hypertensive chronic kidney disease with stage 1 through stage 4 chronic kidney disease, or unspecified chronic kidney disease: Secondary | ICD-10-CM | POA: Diagnosis not present

## 2020-05-08 DIAGNOSIS — N183 Chronic kidney disease, stage 3 unspecified: Secondary | ICD-10-CM | POA: Diagnosis not present

## 2020-05-23 ENCOUNTER — Other Ambulatory Visit: Payer: Self-pay

## 2020-05-23 ENCOUNTER — Ambulatory Visit (INDEPENDENT_AMBULATORY_CARE_PROVIDER_SITE_OTHER): Payer: Medicare Other | Admitting: Podiatry

## 2020-05-23 DIAGNOSIS — M79675 Pain in left toe(s): Secondary | ICD-10-CM

## 2020-05-23 DIAGNOSIS — B351 Tinea unguium: Secondary | ICD-10-CM | POA: Diagnosis not present

## 2020-05-23 DIAGNOSIS — M79674 Pain in right toe(s): Secondary | ICD-10-CM

## 2020-05-23 NOTE — Patient Instructions (Signed)
Medicare Visit today. Toenails only. No calluses. Next appointment already scheduled per Mr. Stacey

## 2020-05-24 DIAGNOSIS — E291 Testicular hypofunction: Secondary | ICD-10-CM | POA: Diagnosis not present

## 2020-05-28 ENCOUNTER — Encounter: Payer: Self-pay | Admitting: Podiatry

## 2020-05-28 NOTE — Progress Notes (Signed)
Subjective:  Patient ID: Jason Hartman, male    DOB: 12-19-1935,  MRN: [id number]  Jason Hartman presents to clinic today for painful thick toenails that are difficult to trim. Pain interferes with ambulation. Aggravating factors include wearing enclosed shoe gear. Pain is relieved with periodic professional debridement.  Allergies  Allergen Reactions  . Oxycodone Anaphylaxis    Other reaction(s): Other (See Comments) Cannot recall specifics  . Iodinated Diagnostic Agents Hives    alleric to renografin,isovue & omnipaque, hives, requires 13 hr prep//a.calhoun, Onset Date: [id number]     . Iodine Hives and Rash    alleric to renografin,isovue & omnipaque, hives, requires 13 hr prep//a.calhoun, Onset Date: [id number] allergic to renografin,isovue & omnipaque, hives, requires 13 hr prep//a.calhoun, Onset Date: [id number]  . Linezolid Hives and Rash       . Methadone Hcl Other (See Comments)    HALLUCINATIONS   . Promethazine Other (See Comments)    Mental status change, DELIRIUM   Other reaction(s): erratic behavior Other reaction(s): Other (See Comments) Mental status change DELIRIUM (pulled out IV)  . Morphine Sulfate Other (See Comments)    Pt not sure about allergy   . Oxycodone Hcl Other (See Comments)    Pt not sure about allergy   . Penicillins Other (See Comments)    Pt not sure about allergy   . Quetiapine Other (See Comments)    Pt not sure about allergy  Other reaction(s): Other (See Comments) Pt not sure about allergy  Pt not sure about allergy   . Statins     Other reaction(s): muscle weakness  . Zolpidem Other (See Comments)    Other reaction(s): erratic behanvior Other reaction(s): Delusions (intolerance) Altered mental status  . Atorvastatin Itching and Other (See Comments)    Tired, weakness   . Carbidopa-Levodopa Anxiety  . Clindamycin Rash  . Colesevelam Other (See Comments)    tired   . Doxazosin Rash  . Lovastatin Other (See Comments)    Tired,  nervousness   . Rosuvastatin Other (See Comments)    Tired, weakness     Review of Systems: Negative except as noted in the HPI. Objective:   Constitutional Jason Hartman is a pleasant 85 y.o. Caucasian male, in NAD. AAO x 3.   Vascular Capillary refill time to digits immediate b/l. Palpable pedal pulses b/l LE. Pedal hair absent. Lower extremity skin temperature gradient within normal limits. No edema noted b/l lower extremities. No cyanosis or clubbing noted.  Neurologic Normal speech. Oriented to person, place, and time. Pt has subjective symptoms of neuropathy. Protective sensation diminished with 10g monofilament b/l. Vibratory sensation decreased b/l.  Dermatologic Pedal skin with normal turgor, texture and tone bilaterally. No open wounds bilaterally. No interdigital macerations bilaterally. Toenails 1-5 b/l elongated, discolored, dystrophic, thickened, crumbly with subungual debris and tenderness to dorsal palpation. Asymptomatic hyperkeratotic lesions right hallux and submet head 5 left foot.  Orthopedic: Normal muscle strength 5/5 to all lower extremity muscle groups bilaterally. No pain crepitus or joint limitation noted with ROM b/l. Uses walking stick for ambulation assistance.   Radiographs: None Assessment:   1. Pain due to onychomycosis of toenails of both feet    Plan:  Patient was evaluated and treated and all questions answered.  Onychomycosis with pain -Nails palliatively debridement as below -Educated on self-care  Procedure: Nail Debridement Rationale: Pain Type of Debridement: manual, sharp debridement. Instrumentation: Nail nipper, rotary burr. Number of Nails: 10 -Examined patient. -Continue diabetic foot care principles. -Patient  to continue soft, supportive shoe gear daily. -Toenails 1-5 b/l were debrided in length and girth with sterile nail nippers and dremel without iatrogenic bleeding.  -Patient to report any pedal injuries to medical professional  immediately. -Patient/POA to call should there be question/concern in the interim.  Return in about 5 weeks (around 06/27/2020) for noncovered Medicare visit.  Marzetta Board, DPM

## 2020-06-02 DIAGNOSIS — M19012 Primary osteoarthritis, left shoulder: Secondary | ICD-10-CM | POA: Diagnosis not present

## 2020-06-30 ENCOUNTER — Encounter: Payer: Self-pay | Admitting: Podiatry

## 2020-06-30 ENCOUNTER — Ambulatory Visit (INDEPENDENT_AMBULATORY_CARE_PROVIDER_SITE_OTHER): Payer: Medicare Other | Admitting: Podiatry

## 2020-06-30 ENCOUNTER — Other Ambulatory Visit: Payer: Self-pay

## 2020-06-30 DIAGNOSIS — M79675 Pain in left toe(s): Secondary | ICD-10-CM

## 2020-06-30 DIAGNOSIS — M79674 Pain in right toe(s): Secondary | ICD-10-CM

## 2020-06-30 DIAGNOSIS — L84 Corns and callosities: Secondary | ICD-10-CM

## 2020-06-30 DIAGNOSIS — B351 Tinea unguium: Secondary | ICD-10-CM

## 2020-06-30 DIAGNOSIS — E1142 Type 2 diabetes mellitus with diabetic polyneuropathy: Secondary | ICD-10-CM | POA: Diagnosis not present

## 2020-07-02 NOTE — Progress Notes (Signed)
Subjective:  Patient ID: Jason Hartman, male    DOB: 08/19/1935,  MRN: [id number]  Jason Hartman presents to clinic today for cash visit for painful thick toenails that are difficult to trim. Pain interferes with ambulation. Aggravating factors include wearing enclosed shoe gear. Pain is relieved with periodic professional debridement.  Allergies  Allergen Reactions  . Oxycodone Anaphylaxis    Other reaction(s): Other (See Comments) Cannot recall specifics  . Iodinated Diagnostic Agents Hives    alleric to renografin,isovue & omnipaque, hives, requires 13 hr prep//a.calhoun, Onset Date: [id number]     . Iodine Hives and Rash    alleric to renografin,isovue & omnipaque, hives, requires 13 hr prep//a.calhoun, Onset Date: [id number] allergic to renografin,isovue & omnipaque, hives, requires 13 hr prep//a.calhoun, Onset Date: [id number]  . Linezolid Hives and Rash       . Methadone Hcl Other (See Comments)    HALLUCINATIONS   . Promethazine Other (See Comments)    Mental status change, DELIRIUM   Other reaction(s): erratic behavior Other reaction(s): Other (See Comments) Mental status change DELIRIUM (pulled out IV)  . Morphine Sulfate Other (See Comments)    Pt not sure about allergy   . Oxycodone Hcl Other (See Comments)    Pt not sure about allergy   . Penicillins Other (See Comments)    Pt not sure about allergy   . Quetiapine Other (See Comments)    Pt not sure about allergy  Other reaction(s): Other (See Comments) Pt not sure about allergy  Pt not sure about allergy   . Statins     Other reaction(s): muscle weakness  . Zolpidem Other (See Comments)    Other reaction(s): erratic behanvior Other reaction(s): Delusions (intolerance) Altered mental status  . Atorvastatin Itching and Other (See Comments)    Tired, weakness   . Carbidopa-Levodopa Anxiety  . Clindamycin Rash  . Colesevelam Other (See Comments)    tired   . Doxazosin Rash  . Lovastatin Other (See  Comments)    Tired, nervousness   . Rosuvastatin Other (See Comments)    Tired, weakness     Review of Systems: Negative except as noted in the HPI. Objective:   Constitutional Jason Hartman is a pleasant 85 y.o. Caucasian male, in NAD. AAO x 3.   Vascular Capillary refill time to digits immediate b/l. Palpable pedal pulses b/l LE. Pedal hair absent. Lower extremity skin temperature gradient within normal limits. No edema noted b/l lower extremities. No cyanosis or clubbing noted.  Neurologic Normal speech. Oriented to person, place, and time. Pt has subjective symptoms of neuropathy. Protective sensation diminished with 10g monofilament b/l. Vibratory sensation decreased b/l.  Dermatologic Pedal skin with normal turgor, texture and tone bilaterally. No open wounds bilaterally. No interdigital macerations bilaterally. Toenails 1-5 b/l elongated, discolored, dystrophic, thickened, crumbly with subungual debris and tenderness to dorsal palpation. Hyperkeratotic lesion(s) submet head 5 left foot.  No erythema, no edema, no drainage, no fluctuance.  Orthopedic: Normal muscle strength 5/5 to all lower extremity muscle groups bilaterally. No pain crepitus or joint limitation noted with ROM b/l. Uses walking stick for ambulation assistance.   Radiographs: None Assessment:   1. Pain due to onychomycosis of toenails of both feet   2. Callus   3. Diabetic peripheral neuropathy associated with type 2 diabetes mellitus ([place])    Plan:  Patient was evaluated and treated and all questions answered.  Onychomycosis with pain -Nails palliatively debridement as below -Educated on self-care  Procedure: Nail Debridement  Rationale: Pain Type of Debridement: manual, sharp debridement. Instrumentation: Nail nipper, rotary burr. Number of Nails: 10 -Examined patient. -Continue diabetic foot care principles. -Patient to continue soft, supportive shoe gear daily. -Toenails 1-5 b/l were debrided in length  and girth with sterile nail nippers and dremel without iatrogenic bleeding.  -Callus(es) submet head 5 left foot pared utilizing sterile scalpel blade without complication or incident. Total number debrided =1. -Patient to report any pedal injuries to medical professional immediately. -Patient/POA to call should there be question/concern in the interim.  Return in about 5 weeks (around 06/27/2020) for covered Medicare visit.  Marzetta Board, DPM

## 2020-07-05 DIAGNOSIS — E1121 Type 2 diabetes mellitus with diabetic nephropathy: Secondary | ICD-10-CM | POA: Diagnosis not present

## 2020-07-05 DIAGNOSIS — N1831 Chronic kidney disease, stage 3a: Secondary | ICD-10-CM | POA: Diagnosis not present

## 2020-07-05 DIAGNOSIS — I129 Hypertensive chronic kidney disease with stage 1 through stage 4 chronic kidney disease, or unspecified chronic kidney disease: Secondary | ICD-10-CM | POA: Diagnosis not present

## 2020-07-05 DIAGNOSIS — N401 Enlarged prostate with lower urinary tract symptoms: Secondary | ICD-10-CM | POA: Diagnosis not present

## 2020-07-07 DIAGNOSIS — E291 Testicular hypofunction: Secondary | ICD-10-CM | POA: Diagnosis not present

## 2020-07-18 DIAGNOSIS — L720 Epidermal cyst: Secondary | ICD-10-CM | POA: Diagnosis not present

## 2020-07-18 DIAGNOSIS — L814 Other melanin hyperpigmentation: Secondary | ICD-10-CM | POA: Diagnosis not present

## 2020-07-18 DIAGNOSIS — L821 Other seborrheic keratosis: Secondary | ICD-10-CM | POA: Diagnosis not present

## 2020-07-18 DIAGNOSIS — L905 Scar conditions and fibrosis of skin: Secondary | ICD-10-CM | POA: Diagnosis not present

## 2020-07-18 DIAGNOSIS — Z85828 Personal history of other malignant neoplasm of skin: Secondary | ICD-10-CM | POA: Diagnosis not present

## 2020-07-18 DIAGNOSIS — D225 Melanocytic nevi of trunk: Secondary | ICD-10-CM | POA: Diagnosis not present

## 2020-07-18 DIAGNOSIS — L570 Actinic keratosis: Secondary | ICD-10-CM | POA: Diagnosis not present

## 2020-07-18 DIAGNOSIS — L820 Inflamed seborrheic keratosis: Secondary | ICD-10-CM | POA: Diagnosis not present

## 2020-07-18 DIAGNOSIS — D1801 Hemangioma of skin and subcutaneous tissue: Secondary | ICD-10-CM | POA: Diagnosis not present

## 2020-07-21 DIAGNOSIS — E291 Testicular hypofunction: Secondary | ICD-10-CM | POA: Diagnosis not present

## 2020-08-02 DIAGNOSIS — G2581 Restless legs syndrome: Secondary | ICD-10-CM | POA: Diagnosis not present

## 2020-08-02 DIAGNOSIS — G20 Parkinson's disease: Secondary | ICD-10-CM | POA: Diagnosis not present

## 2020-08-03 DIAGNOSIS — H25813 Combined forms of age-related cataract, bilateral: Secondary | ICD-10-CM | POA: Diagnosis not present

## 2020-08-03 DIAGNOSIS — H43813 Vitreous degeneration, bilateral: Secondary | ICD-10-CM | POA: Diagnosis not present

## 2020-08-03 DIAGNOSIS — H524 Presbyopia: Secondary | ICD-10-CM | POA: Diagnosis not present

## 2020-08-03 DIAGNOSIS — E119 Type 2 diabetes mellitus without complications: Secondary | ICD-10-CM | POA: Diagnosis not present

## 2020-08-04 DIAGNOSIS — E291 Testicular hypofunction: Secondary | ICD-10-CM | POA: Diagnosis not present

## 2020-08-07 ENCOUNTER — Encounter: Payer: Self-pay | Admitting: Podiatry

## 2020-08-07 ENCOUNTER — Other Ambulatory Visit: Payer: Self-pay

## 2020-08-07 ENCOUNTER — Ambulatory Visit (INDEPENDENT_AMBULATORY_CARE_PROVIDER_SITE_OTHER): Payer: Medicare Other | Admitting: Podiatry

## 2020-08-07 DIAGNOSIS — E1142 Type 2 diabetes mellitus with diabetic polyneuropathy: Secondary | ICD-10-CM

## 2020-08-07 DIAGNOSIS — B351 Tinea unguium: Secondary | ICD-10-CM

## 2020-08-07 DIAGNOSIS — M79674 Pain in right toe(s): Secondary | ICD-10-CM

## 2020-08-07 DIAGNOSIS — L84 Corns and callosities: Secondary | ICD-10-CM

## 2020-08-07 DIAGNOSIS — S9032XA Contusion of left foot, initial encounter: Secondary | ICD-10-CM

## 2020-08-07 DIAGNOSIS — M79675 Pain in left toe(s): Secondary | ICD-10-CM | POA: Diagnosis not present

## 2020-08-07 DIAGNOSIS — K5901 Slow transit constipation: Secondary | ICD-10-CM | POA: Insufficient documentation

## 2020-08-10 NOTE — Progress Notes (Signed)
Subjective:  Patient ID: Jason Hartman, male    DOB: 02-Nov-1935,  MRN: [id number]  85 y.o. male presents at risk foot care with history of diabetic neuropathy and callus(es) b/l feet and painful thick toenails that are difficult to trim. Painful toenails interfere with ambulation. Aggravating factors include wearing enclosed shoe gear. Pain is relieved with periodic professional debridement. Painful calluses are aggravated when weightbearing with and without shoegear. Pain is relieved with periodic professional debridement..  Pain interferes with ambulation. Aggravating factors include wearing enclosed shoe gear. Pain is relieved with periodic professional debridement.  His neuropathy is bothering him on today's visit.  He is accompanied by his wife on today's visit.  He does have a new bruise on the top of his left foot. He states it is tender when pressure applied. He nor his wife can recall any episodes of trauma to his foot. They deny any falls. He is refusing xray on today's visit.  Allergies  Allergen Reactions  . Oxycodone Anaphylaxis    Other reaction(s): Other (See Comments) Cannot recall specifics  . Iodinated Diagnostic Agents Hives    alleric to renografin,isovue & omnipaque, hives, requires 13 hr prep//a.calhoun, Onset Date: [id number]     . Iodine Hives and Rash    alleric to renografin,isovue & omnipaque, hives, requires 13 hr prep//a.calhoun, Onset Date: [id number] allergic to renografin,isovue & omnipaque, hives, requires 13 hr prep//a.calhoun, Onset Date: [id number]  . Linezolid Hives and Rash       . Methadone Hcl Other (See Comments)    HALLUCINATIONS   . Promethazine Other (See Comments)    Mental status change, DELIRIUM   Other reaction(s): erratic behavior Other reaction(s): Other (See Comments) Mental status change DELIRIUM (pulled out IV)  . Morphine Sulfate Other (See Comments)    Pt not sure about allergy   . Oxycodone Hcl Other (See Comments)    Pt not  sure about allergy   . Penicillins Other (See Comments)    Pt not sure about allergy   . Quetiapine Other (See Comments)    Pt not sure about allergy  Other reaction(s): Other (See Comments) Pt not sure about allergy  Pt not sure about allergy   . Statins     Other reaction(s): muscle weakness  . Zolpidem Other (See Comments)    Other reaction(s): erratic behanvior Other reaction(s): Delusions (intolerance) Altered mental status  . Atorvastatin Itching and Other (See Comments)    Tired, weakness   . Carbidopa-Levodopa Anxiety  . Clindamycin Rash  . Colesevelam Other (See Comments)    tired   . Doxazosin Rash  . Lovastatin Other (See Comments)    Tired, nervousness   . Rosuvastatin Other (See Comments)    Tired, weakness     Review of Systems: Negative except as noted in the HPI.   Objective:   Constitutional Pt is a pleasant 85 y.o. Caucasian male in NAD. AAO x 3.   Vascular Capillary refill time to digits immediate b/l. Palpable pedal pulses b/l LE. Pedal hair absent. Lower extremity skin temperature gradient within normal limits.  Neurologic Protective sensation intact 5/5 intact bilaterally with 10g monofilament b/l.  Dermatologic Pedal skin with normal turgor, texture and tone bilaterally. No open wounds bilaterally. No interdigital macerations bilaterally. Toenails 1-5 b/l elongated, discolored, dystrophic, thickened, crumbly with subungual debris and tenderness to dorsal palpation. Hyperkeratotic lesion(s) R hallux and submet head 5 left foot.  No erythema, no edema, no drainage, no fluctuance.  He has a slight contusion noted  on the dorsal aspect of his left foot.  There is trace edema and slight tenderness.  No warmth, no erythema, no fluctuance.  Orthopedic: Normal muscle strength 5/5 to all lower extremity muscle groups bilaterally. No pain crepitus or joint limitation noted with ROM b/l. No gross bony deformities bilaterally. Uses walking stick for ambulation  assistance.   Radiographs: None Assessment:   1. Pain due to onychomycosis of toenails of both feet   2. Callus   3. Diabetic peripheral neuropathy associated with type 2 diabetes mellitus ([place])   4. Contusion of left foot, initial encounter     Plan:  Patient was evaluated and treated and all questions answered.  Onychomycosis with pain -Nails palliatively debridement as below. -Educated on self-care  Procedure: Nail Debridement Rationale: Pain Type of Debridement: manual, sharp debridement. Instrumentation: Nail nipper, rotary burr. Number of Nails: 10  -Examined patient. -Patient to continue soft, supportive shoe gear daily. -Patient does have a bruise that was found on examination today.  He denies any new episodes of trauma or fall.  He refuses x-rays on today's visit.  Wife was informed should condition worsen or become problematic to call the office and we will x-ray his foot.  She related understanding.  Will not bill in evaluation and management for him today due to his refusal for work-up of the bruise of his left foot. -Toenails 1-5 b/l were debrided in length and girth with sterile nail nippers and dremel without iatrogenic bleeding.  -Callus(es) R hallux and submet head 5 left foot pared utilizing sterile scalpel blade without complication or incident. Total number debrided =2. -Patient to report any pedal injuries to medical professional immediately. -Patient/POA to call should there be question/concern in the interim.  Return in about 5 weeks (around 09/11/2020).  Marzetta Board, DPM

## 2020-08-18 DIAGNOSIS — E291 Testicular hypofunction: Secondary | ICD-10-CM | POA: Diagnosis not present

## 2020-08-25 DIAGNOSIS — F411 Generalized anxiety disorder: Secondary | ICD-10-CM | POA: Diagnosis not present

## 2020-08-25 DIAGNOSIS — E1142 Type 2 diabetes mellitus with diabetic polyneuropathy: Secondary | ICD-10-CM | POA: Diagnosis not present

## 2020-08-25 DIAGNOSIS — Z8551 Personal history of malignant neoplasm of bladder: Secondary | ICD-10-CM | POA: Diagnosis not present

## 2020-08-25 DIAGNOSIS — Z9989 Dependence on other enabling machines and devices: Secondary | ICD-10-CM | POA: Diagnosis not present

## 2020-08-25 DIAGNOSIS — F410 Panic disorder [episodic paroxysmal anxiety] without agoraphobia: Secondary | ICD-10-CM | POA: Diagnosis not present

## 2020-08-25 DIAGNOSIS — Z789 Other specified health status: Secondary | ICD-10-CM | POA: Diagnosis not present

## 2020-08-25 DIAGNOSIS — I129 Hypertensive chronic kidney disease with stage 1 through stage 4 chronic kidney disease, or unspecified chronic kidney disease: Secondary | ICD-10-CM | POA: Diagnosis not present

## 2020-08-25 DIAGNOSIS — E1165 Type 2 diabetes mellitus with hyperglycemia: Secondary | ICD-10-CM | POA: Diagnosis not present

## 2020-08-25 DIAGNOSIS — Z794 Long term (current) use of insulin: Secondary | ICD-10-CM | POA: Diagnosis not present

## 2020-08-25 DIAGNOSIS — N1832 Chronic kidney disease, stage 3b: Secondary | ICD-10-CM | POA: Diagnosis not present

## 2020-08-25 DIAGNOSIS — I2510 Atherosclerotic heart disease of native coronary artery without angina pectoris: Secondary | ICD-10-CM | POA: Diagnosis not present

## 2020-08-25 DIAGNOSIS — G20 Parkinson's disease: Secondary | ICD-10-CM | POA: Diagnosis not present

## 2020-08-28 DIAGNOSIS — N1832 Chronic kidney disease, stage 3b: Secondary | ICD-10-CM | POA: Diagnosis not present

## 2020-09-05 ENCOUNTER — Encounter (INDEPENDENT_AMBULATORY_CARE_PROVIDER_SITE_OTHER): Payer: Medicare Other | Admitting: Ophthalmology

## 2020-09-08 ENCOUNTER — Ambulatory Visit: Payer: Medicare Other | Admitting: Podiatry

## 2020-09-08 ENCOUNTER — Inpatient Hospital Stay (HOSPITAL_COMMUNITY)
Admission: EM | Admit: 2020-09-08 | Discharge: 2020-09-25 | DRG: 871 | Disposition: A | Discharge status: Skilled Nursing Facility | Payer: Medicare Other | Attending: Student | Admitting: Student

## 2020-09-08 ENCOUNTER — Other Ambulatory Visit: Payer: Self-pay

## 2020-09-08 ENCOUNTER — Encounter (HOSPITAL_COMMUNITY): Payer: Self-pay | Admitting: Emergency Medicine

## 2020-09-08 ENCOUNTER — Emergency Department (HOSPITAL_COMMUNITY): Payer: Medicare Other

## 2020-09-08 DIAGNOSIS — B957 Other staphylococcus as the cause of diseases classified elsewhere: Secondary | ICD-10-CM | POA: Diagnosis present

## 2020-09-08 DIAGNOSIS — Z794 Long term (current) use of insulin: Secondary | ICD-10-CM

## 2020-09-08 DIAGNOSIS — R6520 Severe sepsis without septic shock: Secondary | ICD-10-CM | POA: Diagnosis present

## 2020-09-08 DIAGNOSIS — A419 Sepsis, unspecified organism: Secondary | ICD-10-CM | POA: Diagnosis not present

## 2020-09-08 DIAGNOSIS — R6510 Systemic inflammatory response syndrome (SIRS) of non-infectious origin without acute organ dysfunction: Secondary | ICD-10-CM | POA: Diagnosis present

## 2020-09-08 DIAGNOSIS — N1831 Chronic kidney disease, stage 3a: Secondary | ICD-10-CM | POA: Diagnosis not present

## 2020-09-08 DIAGNOSIS — G8929 Other chronic pain: Secondary | ICD-10-CM | POA: Diagnosis present

## 2020-09-08 DIAGNOSIS — R7989 Other specified abnormal findings of blood chemistry: Secondary | ICD-10-CM

## 2020-09-08 DIAGNOSIS — R0682 Tachypnea, not elsewhere classified: Secondary | ICD-10-CM

## 2020-09-08 DIAGNOSIS — Z7952 Long term (current) use of systemic steroids: Secondary | ICD-10-CM

## 2020-09-08 DIAGNOSIS — F410 Panic disorder [episodic paroxysmal anxiety] without agoraphobia: Secondary | ICD-10-CM | POA: Diagnosis present

## 2020-09-08 DIAGNOSIS — Z955 Presence of coronary angioplasty implant and graft: Secondary | ICD-10-CM

## 2020-09-08 DIAGNOSIS — I4892 Unspecified atrial flutter: Secondary | ICD-10-CM | POA: Diagnosis present

## 2020-09-08 DIAGNOSIS — Z91041 Radiographic dye allergy status: Secondary | ICD-10-CM

## 2020-09-08 DIAGNOSIS — N136 Pyonephrosis: Secondary | ICD-10-CM | POA: Diagnosis present

## 2020-09-08 DIAGNOSIS — M6282 Rhabdomyolysis: Secondary | ICD-10-CM | POA: Diagnosis not present

## 2020-09-08 DIAGNOSIS — E860 Dehydration: Secondary | ICD-10-CM | POA: Diagnosis not present

## 2020-09-08 DIAGNOSIS — E1122 Type 2 diabetes mellitus with diabetic chronic kidney disease: Secondary | ICD-10-CM

## 2020-09-08 DIAGNOSIS — F419 Anxiety disorder, unspecified: Secondary | ICD-10-CM | POA: Diagnosis not present

## 2020-09-08 DIAGNOSIS — J9811 Atelectasis: Secondary | ICD-10-CM | POA: Diagnosis present

## 2020-09-08 DIAGNOSIS — R338 Other retention of urine: Secondary | ICD-10-CM | POA: Diagnosis present

## 2020-09-08 DIAGNOSIS — M4802 Spinal stenosis, cervical region: Secondary | ICD-10-CM | POA: Diagnosis present

## 2020-09-08 DIAGNOSIS — G4733 Obstructive sleep apnea (adult) (pediatric): Secondary | ICD-10-CM | POA: Diagnosis present

## 2020-09-08 DIAGNOSIS — R110 Nausea: Secondary | ICD-10-CM | POA: Diagnosis not present

## 2020-09-08 DIAGNOSIS — I2510 Atherosclerotic heart disease of native coronary artery without angina pectoris: Secondary | ICD-10-CM | POA: Diagnosis present

## 2020-09-08 DIAGNOSIS — Z23 Encounter for immunization: Secondary | ICD-10-CM

## 2020-09-08 DIAGNOSIS — I493 Ventricular premature depolarization: Secondary | ICD-10-CM | POA: Diagnosis present

## 2020-09-08 DIAGNOSIS — F0390 Unspecified dementia without behavioral disturbance: Secondary | ICD-10-CM | POA: Diagnosis present

## 2020-09-08 DIAGNOSIS — N99820 Postprocedural hemorrhage and hematoma of a genitourinary system organ or structure following a genitourinary system procedure: Secondary | ICD-10-CM | POA: Diagnosis not present

## 2020-09-08 DIAGNOSIS — G2581 Restless legs syndrome: Secondary | ICD-10-CM | POA: Diagnosis present

## 2020-09-08 DIAGNOSIS — E1140 Type 2 diabetes mellitus with diabetic neuropathy, unspecified: Secondary | ICD-10-CM | POA: Diagnosis present

## 2020-09-08 DIAGNOSIS — Y838 Other surgical procedures as the cause of abnormal reaction of the patient, or of later complication, without mention of misadventure at the time of the procedure: Secondary | ICD-10-CM | POA: Diagnosis present

## 2020-09-08 DIAGNOSIS — Z9119 Patient's noncompliance with other medical treatment and regimen: Secondary | ICD-10-CM

## 2020-09-08 DIAGNOSIS — E119 Type 2 diabetes mellitus without complications: Secondary | ICD-10-CM

## 2020-09-08 DIAGNOSIS — N35819 Other urethral stricture, male, unspecified site: Secondary | ICD-10-CM | POA: Diagnosis present

## 2020-09-08 DIAGNOSIS — N179 Acute kidney failure, unspecified: Secondary | ICD-10-CM | POA: Diagnosis present

## 2020-09-08 DIAGNOSIS — Z885 Allergy status to narcotic agent status: Secondary | ICD-10-CM

## 2020-09-08 DIAGNOSIS — R579 Shock, unspecified: Secondary | ICD-10-CM

## 2020-09-08 DIAGNOSIS — N390 Urinary tract infection, site not specified: Secondary | ICD-10-CM

## 2020-09-08 DIAGNOSIS — R61 Generalized hyperhidrosis: Secondary | ICD-10-CM | POA: Diagnosis not present

## 2020-09-08 DIAGNOSIS — Z7901 Long term (current) use of anticoagulants: Secondary | ICD-10-CM

## 2020-09-08 DIAGNOSIS — N320 Bladder-neck obstruction: Secondary | ICD-10-CM | POA: Diagnosis present

## 2020-09-08 DIAGNOSIS — Z87891 Personal history of nicotine dependence: Secondary | ICD-10-CM

## 2020-09-08 DIAGNOSIS — E1165 Type 2 diabetes mellitus with hyperglycemia: Secondary | ICD-10-CM | POA: Diagnosis not present

## 2020-09-08 DIAGNOSIS — Z9114 Patient's other noncompliance with medication regimen: Secondary | ICD-10-CM

## 2020-09-08 DIAGNOSIS — Z833 Family history of diabetes mellitus: Secondary | ICD-10-CM

## 2020-09-08 DIAGNOSIS — R4182 Altered mental status, unspecified: Secondary | ICD-10-CM

## 2020-09-08 DIAGNOSIS — E870 Hyperosmolality and hypernatremia: Secondary | ICD-10-CM | POA: Diagnosis not present

## 2020-09-08 DIAGNOSIS — G20 Parkinson's disease: Secondary | ICD-10-CM

## 2020-09-08 DIAGNOSIS — I248 Other forms of acute ischemic heart disease: Secondary | ICD-10-CM | POA: Diagnosis present

## 2020-09-08 DIAGNOSIS — R17 Unspecified jaundice: Secondary | ICD-10-CM

## 2020-09-08 DIAGNOSIS — I129 Hypertensive chronic kidney disease with stage 1 through stage 4 chronic kidney disease, or unspecified chronic kidney disease: Secondary | ICD-10-CM | POA: Diagnosis not present

## 2020-09-08 DIAGNOSIS — Z79899 Other long term (current) drug therapy: Secondary | ICD-10-CM

## 2020-09-08 DIAGNOSIS — I119 Hypertensive heart disease without heart failure: Secondary | ICD-10-CM | POA: Diagnosis present

## 2020-09-08 DIAGNOSIS — B952 Enterococcus as the cause of diseases classified elsewhere: Secondary | ICD-10-CM | POA: Diagnosis present

## 2020-09-08 DIAGNOSIS — E669 Obesity, unspecified: Secondary | ICD-10-CM | POA: Diagnosis present

## 2020-09-08 DIAGNOSIS — E11649 Type 2 diabetes mellitus with hypoglycemia without coma: Secondary | ICD-10-CM | POA: Diagnosis present

## 2020-09-08 DIAGNOSIS — G219 Secondary parkinsonism, unspecified: Secondary | ICD-10-CM | POA: Diagnosis present

## 2020-09-08 DIAGNOSIS — N183 Chronic kidney disease, stage 3 unspecified: Secondary | ICD-10-CM | POA: Diagnosis not present

## 2020-09-08 DIAGNOSIS — R059 Cough, unspecified: Secondary | ICD-10-CM | POA: Diagnosis not present

## 2020-09-08 DIAGNOSIS — R0902 Hypoxemia: Secondary | ICD-10-CM | POA: Diagnosis not present

## 2020-09-08 DIAGNOSIS — R000 Tachycardia, unspecified: Secondary | ICD-10-CM | POA: Diagnosis not present

## 2020-09-08 DIAGNOSIS — Z888 Allergy status to other drugs, medicaments and biological substances status: Secondary | ICD-10-CM

## 2020-09-08 DIAGNOSIS — E861 Hypovolemia: Secondary | ICD-10-CM | POA: Diagnosis not present

## 2020-09-08 DIAGNOSIS — I10 Essential (primary) hypertension: Secondary | ICD-10-CM | POA: Diagnosis not present

## 2020-09-08 DIAGNOSIS — R52 Pain, unspecified: Secondary | ICD-10-CM

## 2020-09-08 DIAGNOSIS — Z6832 Body mass index (BMI) 32.0-32.9, adult: Secondary | ICD-10-CM

## 2020-09-08 DIAGNOSIS — F1123 Opioid dependence with withdrawal: Secondary | ICD-10-CM | POA: Diagnosis present

## 2020-09-08 DIAGNOSIS — D751 Secondary polycythemia: Secondary | ICD-10-CM | POA: Diagnosis present

## 2020-09-08 DIAGNOSIS — Z20822 Contact with and (suspected) exposure to covid-19: Secondary | ICD-10-CM | POA: Diagnosis present

## 2020-09-08 DIAGNOSIS — I480 Paroxysmal atrial fibrillation: Secondary | ICD-10-CM | POA: Diagnosis present

## 2020-09-08 DIAGNOSIS — E1121 Type 2 diabetes mellitus with diabetic nephropathy: Secondary | ICD-10-CM | POA: Diagnosis not present

## 2020-09-08 DIAGNOSIS — N401 Enlarged prostate with lower urinary tract symptoms: Secondary | ICD-10-CM | POA: Diagnosis not present

## 2020-09-08 DIAGNOSIS — R778 Other specified abnormalities of plasma proteins: Secondary | ICD-10-CM

## 2020-09-08 DIAGNOSIS — G9341 Metabolic encephalopathy: Secondary | ICD-10-CM

## 2020-09-08 DIAGNOSIS — D696 Thrombocytopenia, unspecified: Secondary | ICD-10-CM | POA: Diagnosis not present

## 2020-09-08 LAB — I-STAT CHEM 8, ED
BUN: 39 mg/dL — ABNORMAL HIGH (ref 8–23)
Calcium, Ion: 1.03 mmol/L — ABNORMAL LOW (ref 1.15–1.40)
Chloride: 98 mmol/L (ref 98–111)
Creatinine, Ser: 1.3 mg/dL — ABNORMAL HIGH (ref 0.61–1.24)
Glucose, Bld: 308 mg/dL — ABNORMAL HIGH (ref 70–99)
HCT: 59 % — ABNORMAL HIGH (ref 39.0–52.0)
Hemoglobin: 20.1 g/dL — ABNORMAL HIGH (ref 13.0–17.0)
Potassium: 4.3 mmol/L (ref 3.5–5.1)
Sodium: 134 mmol/L — ABNORMAL LOW (ref 135–145)
TCO2: 20 mmol/L — ABNORMAL LOW (ref 22–32)

## 2020-09-08 IMAGING — DX DG CHEST 1V PORT
1 series · 1 of 1 positions shown · non-contrast
Comparison: [DATE]

CLINICAL DATA: Anxiety with nausea and cough.

EXAM:
PORTABLE CHEST 1 VIEW

[chest ap]
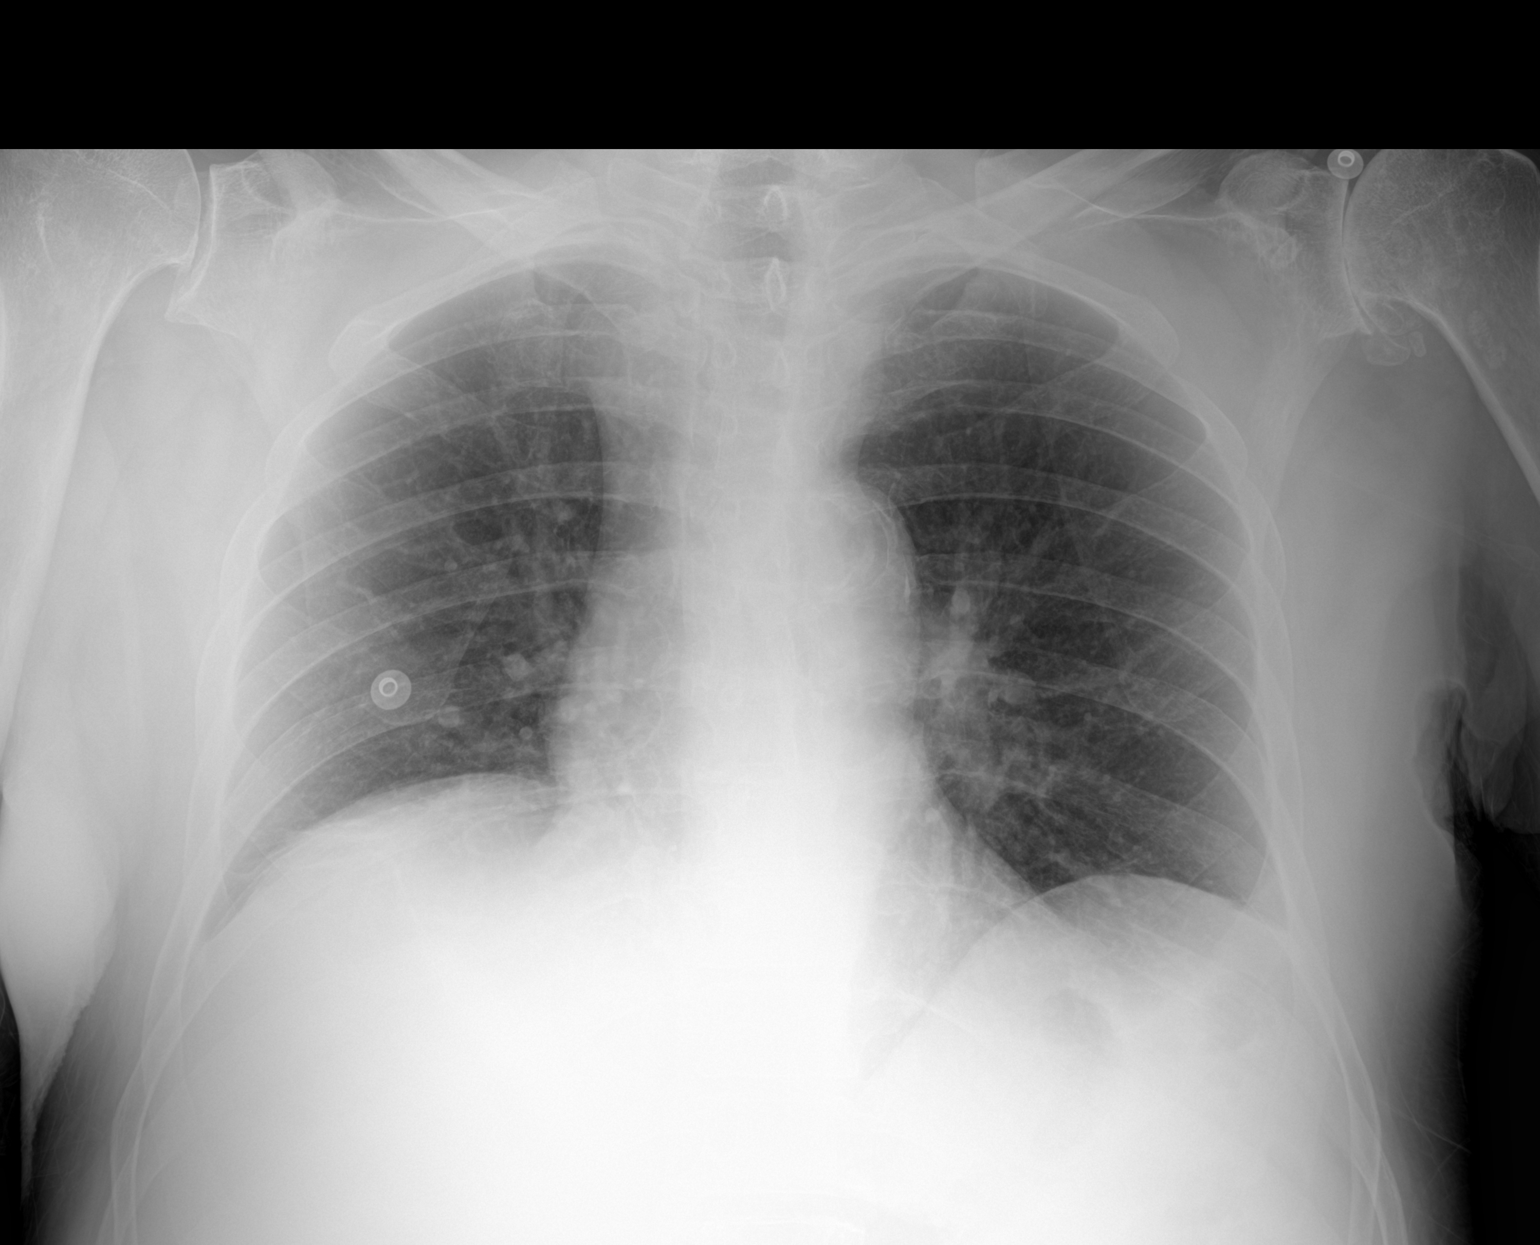

[1 of 1 positions shown; findings below may reference images not displayed]

FINDINGS: Low lung volumes are seen which is likely secondary to the degree of
patient inspiration. Mild, stable linear scarring and/or atelectasis
is seen within the right lung base. There is no evidence of a
pleural effusion or pneumothorax. The heart size and mediastinal
contours are within normal limits. Mild to moderate severity
calcification of the thoracic aorta is seen. Degenerative changes
seen involving the left shoulder.
IMPRESSION: Mild, stable right basilar linear scarring and/or atelectasis.

## 2020-09-08 NOTE — ED Triage Notes (Signed)
Pt came from home via EMS. C/c: severe anxiety, Nausea, cough. PMH of severe anxiety for years, pt prescribed cymbalta a few days ago but hasn't been taking it. Pt also noncompliant with BP and DM meds for a few days (hasn't taken insulin ond metformin). Per family, pt has been in this state for a few days so they are concerned.  CBG: 275 160/100 120-130 bpm 96% on RA

## 2020-09-09 ENCOUNTER — Encounter (HOSPITAL_COMMUNITY): Payer: Self-pay | Admitting: Emergency Medicine

## 2020-09-09 ENCOUNTER — Observation Stay (HOSPITAL_COMMUNITY): Payer: Medicare Other

## 2020-09-09 DIAGNOSIS — Z794 Long term (current) use of insulin: Secondary | ICD-10-CM | POA: Diagnosis not present

## 2020-09-09 DIAGNOSIS — G4733 Obstructive sleep apnea (adult) (pediatric): Secondary | ICD-10-CM | POA: Diagnosis not present

## 2020-09-09 DIAGNOSIS — N1831 Chronic kidney disease, stage 3a: Secondary | ICD-10-CM | POA: Diagnosis not present

## 2020-09-09 DIAGNOSIS — R61 Generalized hyperhidrosis: Secondary | ICD-10-CM | POA: Insufficient documentation

## 2020-09-09 DIAGNOSIS — R791 Abnormal coagulation profile: Secondary | ICD-10-CM | POA: Diagnosis not present

## 2020-09-09 DIAGNOSIS — F419 Anxiety disorder, unspecified: Secondary | ICD-10-CM | POA: Diagnosis present

## 2020-09-09 DIAGNOSIS — E1122 Type 2 diabetes mellitus with diabetic chronic kidney disease: Secondary | ICD-10-CM | POA: Diagnosis not present

## 2020-09-09 DIAGNOSIS — R7989 Other specified abnormal findings of blood chemistry: Secondary | ICD-10-CM | POA: Diagnosis not present

## 2020-09-09 DIAGNOSIS — R6510 Systemic inflammatory response syndrome (SIRS) of non-infectious origin without acute organ dysfunction: Secondary | ICD-10-CM | POA: Diagnosis not present

## 2020-09-09 DIAGNOSIS — R008 Other abnormalities of heart beat: Secondary | ICD-10-CM

## 2020-09-09 DIAGNOSIS — R778 Other specified abnormalities of plasma proteins: Secondary | ICD-10-CM | POA: Diagnosis not present

## 2020-09-09 LAB — ECHOCARDIOGRAM COMPLETE
Area-P 1/2: 3.53 cm2
Height: 64 in
S' Lateral: 2.8 cm
Weight: 2848 oz

## 2020-09-09 LAB — CBG MONITORING, ED
Glucose-Capillary: 298 mg/dL — ABNORMAL HIGH (ref 70–99)
Glucose-Capillary: 193 mg/dL — ABNORMAL HIGH (ref 70–99)
Glucose-Capillary: 121 mg/dL — ABNORMAL HIGH (ref 70–99)

## 2020-09-09 LAB — BLOOD CULTURE ID PANEL (REFLEXED) - BCID2

## 2020-09-09 LAB — URINALYSIS, ROUTINE W REFLEX MICROSCOPIC
Bilirubin Urine: NEGATIVE
Glucose, UA: >=500 mg/dL — AB
Ketones, ur: 80 mg/dL — AB
Nitrite: NEGATIVE
Protein, ur: >=300 mg/dL — AB
Specific Gravity, Urine: 1.017 (ref 1.005–1.030)
pH: 5 (ref 5.0–8.0)

## 2020-09-09 LAB — HEPATIC FUNCTION PANEL
ALT: 32 U/L (ref 0–44)
AST: 27 U/L (ref 15–41)
Albumin: 4.3 g/dL (ref 3.5–5.0)
Alkaline Phosphatase: 65 U/L (ref 38–126)
Bilirubin, Direct: 0.5 mg/dL — ABNORMAL HIGH (ref 0.0–0.2)
Indirect Bilirubin: 2.2 mg/dL — ABNORMAL HIGH (ref 0.3–0.9)
Total Bilirubin: 2.7 mg/dL — ABNORMAL HIGH (ref 0.3–1.2)
Total Protein: 7.6 g/dL (ref 6.5–8.1)

## 2020-09-09 LAB — GLUCOSE, CAPILLARY
Glucose-Capillary: 56 mg/dL — ABNORMAL LOW (ref 70–99)
Glucose-Capillary: 119 mg/dL — ABNORMAL HIGH (ref 70–99)
Glucose-Capillary: 68 mg/dL — ABNORMAL LOW (ref 70–99)
Glucose-Capillary: 57 mg/dL — ABNORMAL LOW (ref 70–99)
Glucose-Capillary: 82 mg/dL (ref 70–99)
Glucose-Capillary: 92 mg/dL (ref 70–99)

## 2020-09-09 LAB — CBC WITH DIFFERENTIAL/PLATELET
Abs Immature Granulocytes: 0.06 10*3/uL (ref 0.00–0.07)
Basophils Absolute: 0 10*3/uL (ref 0.0–0.1)
Basophils Relative: 0 %
Eosinophils Absolute: 0 10*3/uL (ref 0.0–0.5)
Eosinophils Relative: 0 %
HCT: 58.5 % — ABNORMAL HIGH (ref 39.0–52.0)
Hemoglobin: 19 g/dL — ABNORMAL HIGH (ref 13.0–17.0)
Immature Granulocytes: 0 %
Lymphocytes Relative: 5 %
Lymphs Abs: 0.7 10*3/uL (ref 0.7–4.0)
MCH: 28.3 pg (ref 26.0–34.0)
MCHC: 32.5 g/dL (ref 30.0–36.0)
MCV: 87.2 fL (ref 80.0–100.0)
Monocytes Absolute: 0.9 10*3/uL (ref 0.1–1.0)
Monocytes Relative: 6 %
Neutro Abs: 14 10*3/uL — ABNORMAL HIGH (ref 1.7–7.7)
Neutrophils Relative %: 89 %
Platelets: 200 10*3/uL (ref 150–400)
RBC: 6.71 MIL/uL — ABNORMAL HIGH (ref 4.22–5.81)
RDW: 14.6 % (ref 11.5–15.5)
WBC: 15.7 10*3/uL — ABNORMAL HIGH (ref 4.0–10.5)
nRBC: 0 % (ref 0.0–0.2)

## 2020-09-09 LAB — RAPID URINE DRUG SCREEN, HOSP PERFORMED
Amphetamines: NOT DETECTED
Barbiturates: NOT DETECTED
Benzodiazepines: NOT DETECTED
Cocaine: NOT DETECTED
Opiates: NOT DETECTED
Tetrahydrocannabinol: NOT DETECTED

## 2020-09-09 LAB — TROPONIN I (HIGH SENSITIVITY)
Troponin I (High Sensitivity): 28 ng/L — ABNORMAL HIGH (ref <18)
Troponin I (High Sensitivity): 32 ng/L — ABNORMAL HIGH (ref <18)

## 2020-09-09 LAB — TSH: TSH: 1.094 u[IU]/mL (ref 0.350–4.500)

## 2020-09-09 LAB — RESP PANEL BY RT-PCR (FLU A&B, COVID) ARPGX2
Influenza A by PCR: NEGATIVE
Influenza B by PCR: NEGATIVE
SARS Coronavirus 2 by RT PCR: NEGATIVE

## 2020-09-09 LAB — CK: Total CK: 263 U/L (ref 49–397)

## 2020-09-09 LAB — D-DIMER, QUANTITATIVE: D-Dimer, Quant: 1.87 ug/mL-FEU — ABNORMAL HIGH (ref 0.00–0.50)

## 2020-09-09 LAB — C-REACTIVE PROTEIN: CRP: 0.6 mg/dL (ref <1.0)

## 2020-09-09 IMAGING — US US ABDOMEN LIMITED
1 series · 15 of 25 positions shown · non-contrast
Comparison: CT [DATE]

CLINICAL DATA: Increased bilirubin.

EXAM:
ULTRASOUND ABDOMEN LIMITED RIGHT UPPER QUADRANT

[Series 1: us abdomen limited ruq mc & wl · 15 of 25 slices shown]
[im 1/25]
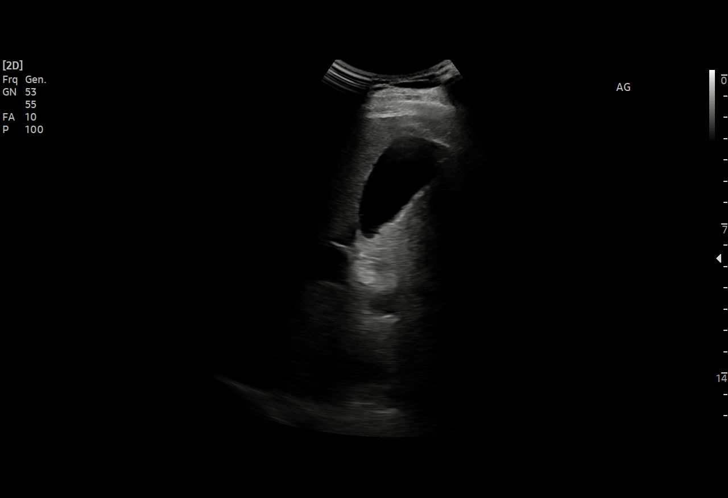
[im 3/25]
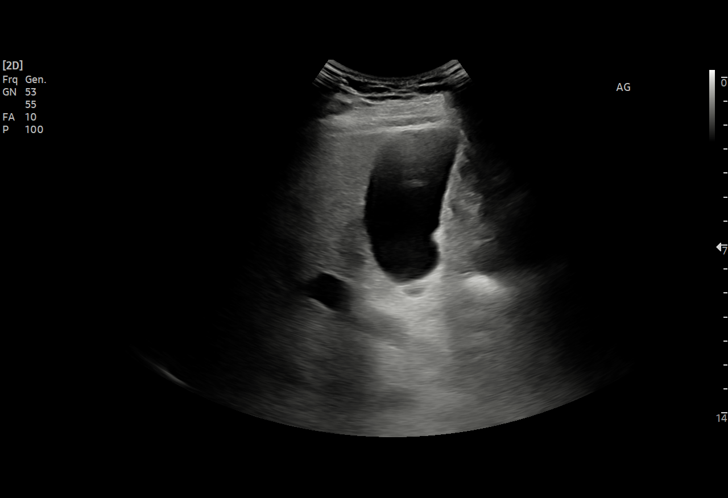
[im 5/25]
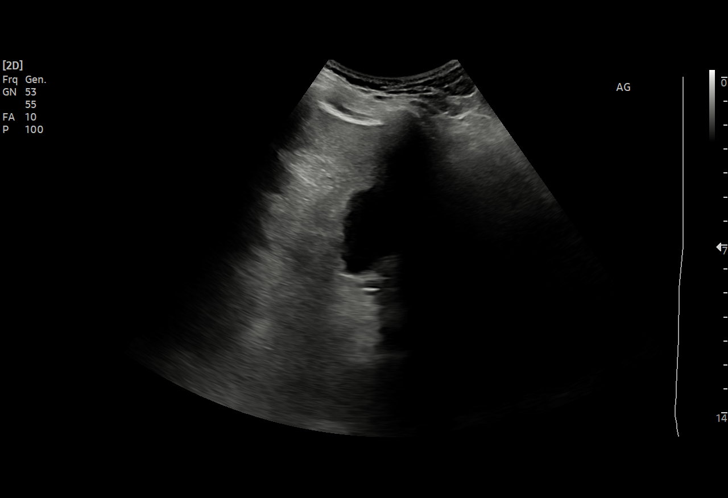
[im 6/25]
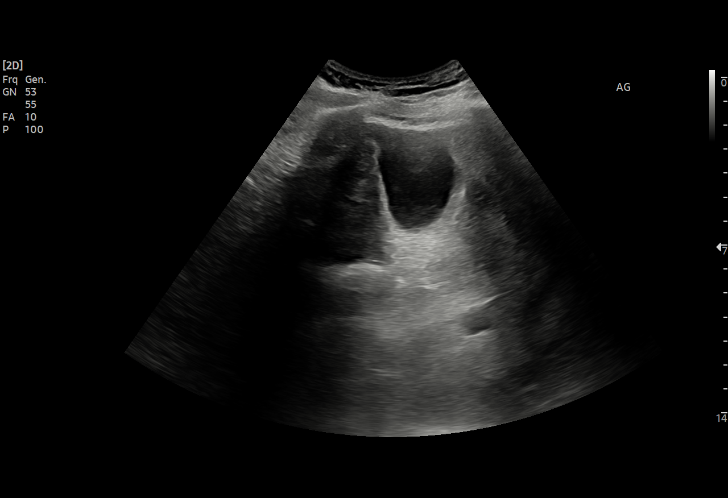
[im 8/25]
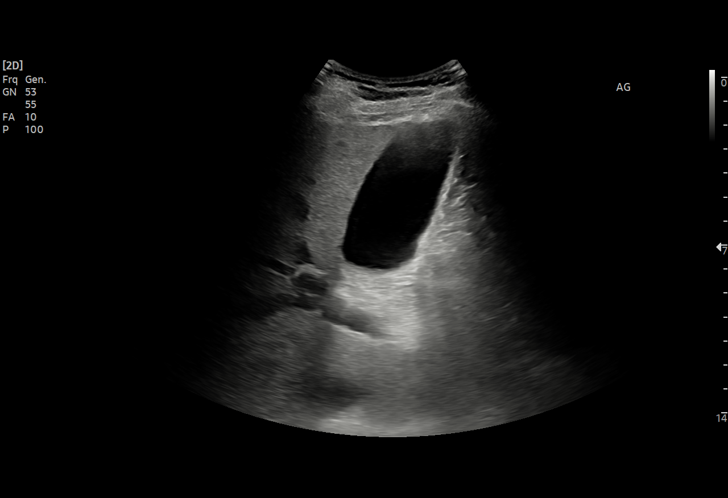
[im 10/25]
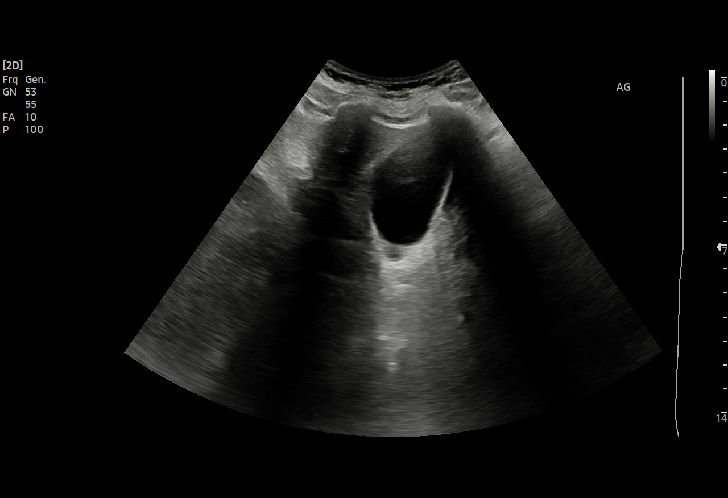
[im 11/25]
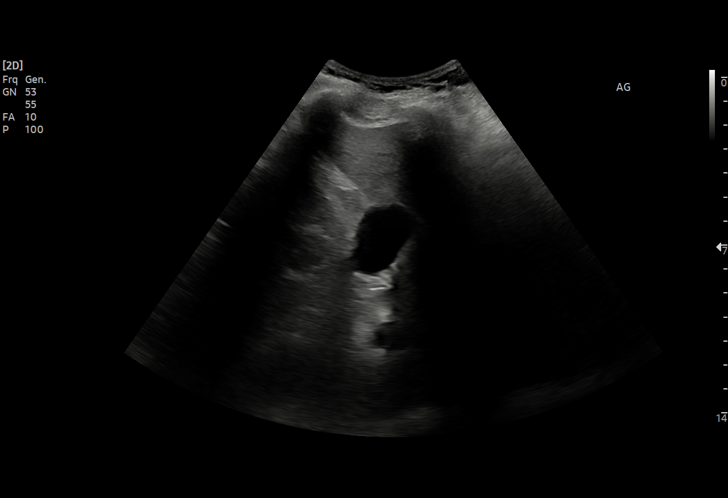
[im 13/25]
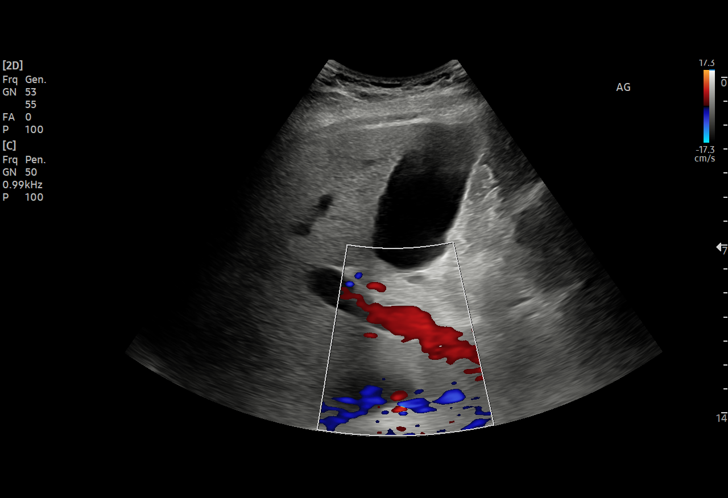
[im 15/25]
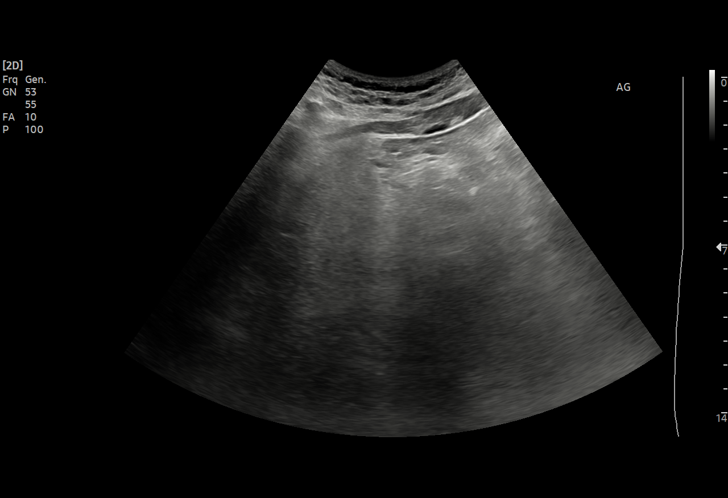
[im 16/25]
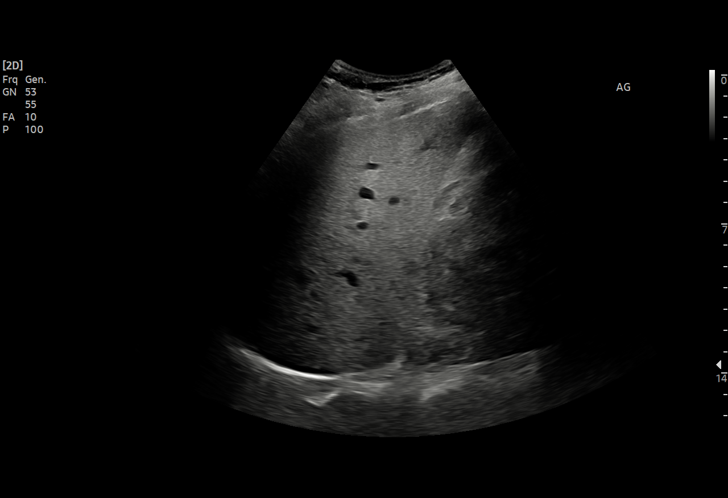
[im 18/25]
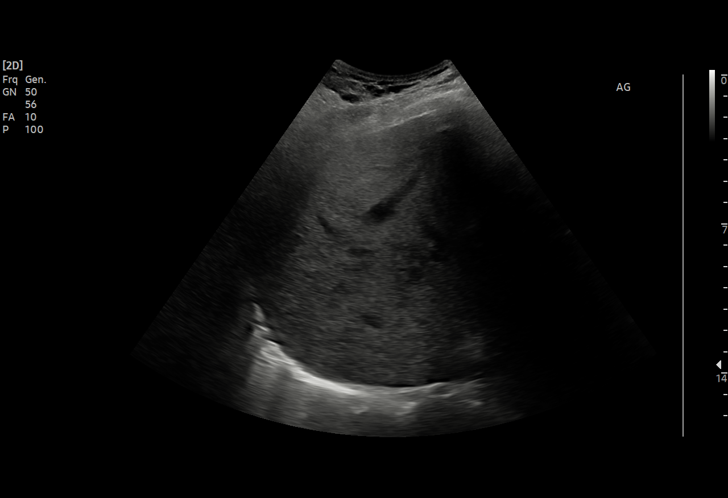
[im 20/25]
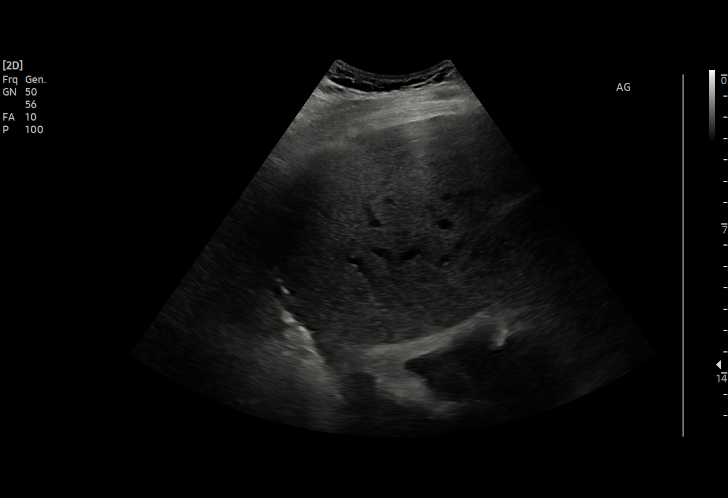
[im 21/25]
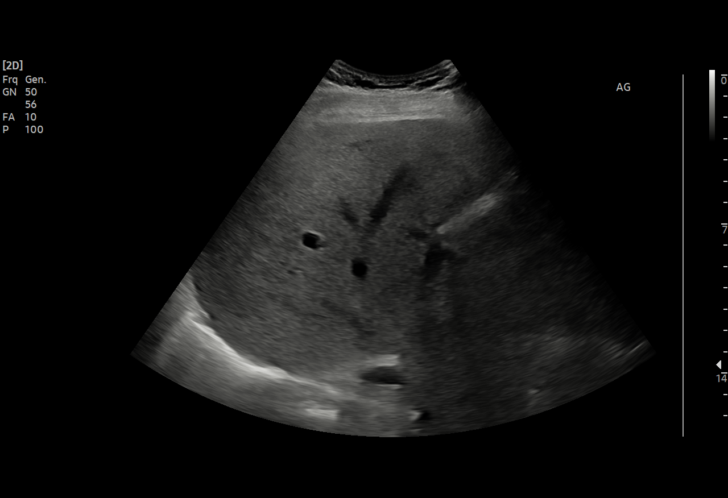
[im 23/25]
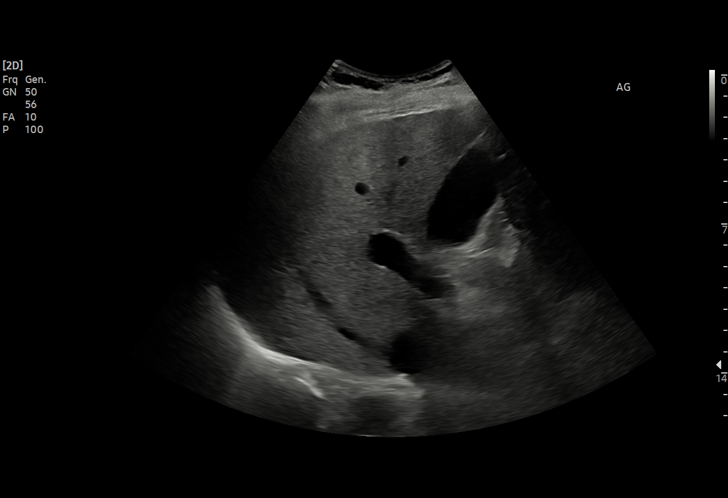
[im 25/25]
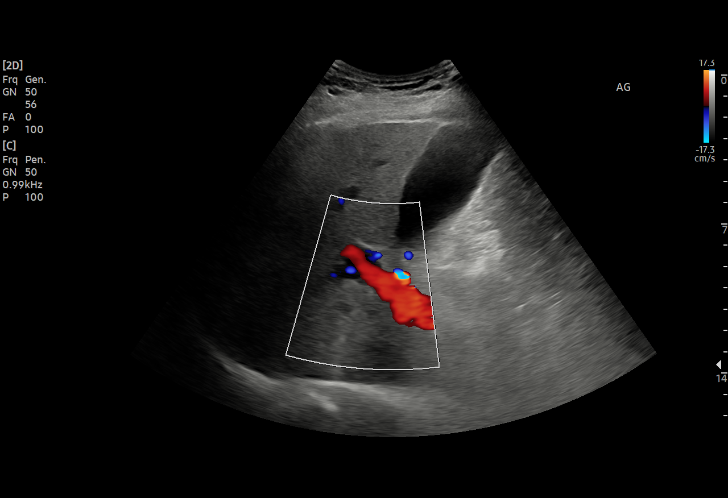

[15 of 25 positions shown; findings below may reference images not displayed]

FINDINGS: Gallbladder:

No gallstones or wall thickening visualized. No sonographic Murphy
sign noted by sonographer.

Common bile duct:

Diameter: Normal at 2 mm

Liver:

No focal lesion identified. Within normal limits in parenchymal
echogenicity. Portal vein is patent on color Doppler imaging with
normal direction of blood flow towards the liver.

Other: None.
IMPRESSION: 1. Normal gallbladder and common bile duct.
2. No biliary duct dilatation.

## 2020-09-09 MED ORDER — INSULIN GLARGINE 100 UNIT/ML ~~LOC~~ SOLN
30.0000 [IU] | Freq: Two times a day (BID) | SUBCUTANEOUS | Status: DC
  Administered 2020-09-09: 30 [IU] via SUBCUTANEOUS
  Filled 2020-09-09 (×2): qty 0.3

## 2020-09-09 MED ORDER — SENNOSIDES-DOCUSATE SODIUM 8.6-50 MG PO TABS
1.0000 | ORAL_TABLET | Freq: Every evening | ORAL | Status: DC | PRN
  Administered 2020-09-19: 1 via ORAL
  Filled 2020-09-09: qty 1

## 2020-09-09 MED ORDER — LACTATED RINGERS IV SOLN: INTRAVENOUS | Status: DC

## 2020-09-09 MED ORDER — DEXTROSE 10 % IV SOLN: INTRAVENOUS | Status: DC

## 2020-09-09 MED ORDER — INSULIN GLARGINE 100 UNIT/ML ~~LOC~~ SOLN
50.0000 [IU] | Freq: Two times a day (BID) | SUBCUTANEOUS | Status: DC
  Administered 2020-09-09 (×2): 50 [IU] via SUBCUTANEOUS
  Filled 2020-09-09 (×3): qty 0.5

## 2020-09-09 MED ORDER — SODIUM CHLORIDE 0.9 % IV SOLN: Freq: Once | INTRAVENOUS | Status: AC

## 2020-09-09 MED ORDER — TRAMADOL HCL 50 MG PO TABS: 50.0000 mg | ORAL_TABLET | Freq: Two times a day (BID) | ORAL | Status: DC | PRN

## 2020-09-09 MED ORDER — TECHNETIUM TO 99M ALBUMIN AGGREGATED: 4.1000 | Freq: Once | INTRAVENOUS | Status: DC | PRN

## 2020-09-09 MED ORDER — HEPARIN SODIUM (PORCINE) 5000 UNIT/ML IJ SOLN
5000.0000 [IU] | Freq: Three times a day (TID) | INTRAMUSCULAR | Status: DC
  Administered 2020-09-09 – 2020-09-10 (×5): 5000 [IU] via SUBCUTANEOUS
  Filled 2020-09-09 (×5): qty 1

## 2020-09-09 MED ORDER — INSULIN ASPART 100 UNIT/ML IJ SOLN
0.0000 [IU] | Freq: Every day | INTRAMUSCULAR | Status: DC
  Administered 2020-09-09: 3 [IU] via SUBCUTANEOUS
  Filled 2020-09-09: qty 0.05

## 2020-09-09 MED ORDER — ACETAMINOPHEN 650 MG RE SUPP: 650.0000 mg | Freq: Four times a day (QID) | RECTAL | Status: DC | PRN

## 2020-09-09 MED ORDER — SODIUM CHLORIDE 0.9 % IV BOLUS
500.0000 mL | Freq: Once | INTRAVENOUS | Status: AC
  Administered 2020-09-09: 500 mL via INTRAVENOUS

## 2020-09-09 MED ORDER — SODIUM CHLORIDE 0.9 % IV SOLN
1.0000 g | Freq: Once | INTRAVENOUS | Status: AC
  Administered 2020-09-09: 1 g via INTRAVENOUS
  Filled 2020-09-09: qty 1

## 2020-09-09 MED ORDER — DILTIAZEM HCL ER COATED BEADS 120 MG PO CP24
120.0000 mg | ORAL_CAPSULE | Freq: Every day | ORAL | Status: DC
  Administered 2020-09-09: 120 mg via ORAL
  Filled 2020-09-09: qty 1

## 2020-09-09 MED ORDER — ACETAMINOPHEN 325 MG PO TABS
650.0000 mg | ORAL_TABLET | Freq: Four times a day (QID) | ORAL | Status: DC | PRN
  Administered 2020-09-11 – 2020-09-24 (×11): 650 mg via ORAL
  Filled 2020-09-09 (×12): qty 2

## 2020-09-09 MED ORDER — HALOPERIDOL LACTATE 2 MG/ML PO CONC
2.0000 mg | Freq: Four times a day (QID) | ORAL | Status: DC | PRN
  Administered 2020-09-09: 2 mg via ORAL
  Filled 2020-09-09 (×2): qty 1

## 2020-09-09 MED ORDER — DIAZEPAM 5 MG/ML IJ SOLN
5.0000 mg | Freq: Once | INTRAMUSCULAR | Status: AC
  Administered 2020-09-09: 5 mg via INTRAVENOUS
  Filled 2020-09-09: qty 2

## 2020-09-09 MED ORDER — OFLOXACIN 0.3 % OP SOLN: 1.0000 [drp] | Freq: Four times a day (QID) | OPHTHALMIC | Status: DC | PRN

## 2020-09-09 MED ORDER — ONDANSETRON HCL 4 MG/2ML IJ SOLN: 4.0000 mg | Freq: Four times a day (QID) | INTRAMUSCULAR | Status: DC | PRN

## 2020-09-09 MED ORDER — LORAZEPAM 2 MG/ML IJ SOLN
1.0000 mg | INTRAMUSCULAR | Status: DC | PRN
  Administered 2020-09-09 – 2020-09-10 (×3): 1 mg via INTRAVENOUS
  Filled 2020-09-09 (×3): qty 1

## 2020-09-09 MED ORDER — ONDANSETRON HCL 4 MG PO TABS: 4.0000 mg | ORAL_TABLET | Freq: Four times a day (QID) | ORAL | Status: DC | PRN

## 2020-09-09 MED ORDER — SODIUM CHLORIDE 0.9 % IV SOLN: INTRAVENOUS | Status: DC

## 2020-09-09 MED ORDER — ROTIGOTINE 8 MG/24HR TD PT24: 1.0000 | MEDICATED_PATCH | Freq: Every evening | TRANSDERMAL | Status: DC

## 2020-09-09 MED ORDER — INSULIN ASPART 100 UNIT/ML IJ SOLN
0.0000 [IU] | Freq: Three times a day (TID) | INTRAMUSCULAR | Status: DC
  Administered 2020-09-09: 2 [IU] via SUBCUTANEOUS
  Administered 2020-09-09: 3 [IU] via SUBCUTANEOUS
  Filled 2020-09-09: qty 0.15

## 2020-09-09 MED ORDER — BUSPIRONE HCL 10 MG PO TABS: 10.0000 mg | ORAL_TABLET | Freq: Two times a day (BID) | ORAL | Status: DC

## 2020-09-09 NOTE — ED Notes (Signed)
Patient cleaned up again after urinating again on the bed and floor. Linens changed.

## 2020-09-09 NOTE — H&P (Signed)
History and Physical    Jason Hartman ZOX:[id number] DOB: 1935-04-25 DOA: 09/08/2020  PCP: [name], MD   Patient coming from: Home   Chief Complaint: Anxiety, sweating   HPI: Jason Hartman is a 85 y.o. male with medical history significant for OSA, Parkinson's, restless leg syndrome, anxiety, chronic pain, chronic kidney disease, and insulin-dependent diabetes mellitus, now presenting to the emergency department for evaluation of anxiety and diaphoresis.  Patient has a long history of anxiety and per report of his family, has been increasingly anxious over the past couple weeks, has been saying that he feels like he is going to die, and has been sweating profusely.  Patient states that he feels "horrible," but has difficulty providing any more specificity though he denies chest pain, abdominal pain, flank pain, cough, dysuria, vomiting, or diarrhea.  He also denies hallucinations or suicidal ideation.  Patient states that he recently was told to stop taking Xanax.  He states that he was started on a different medication for anxiety but does not remember the name of it.  Family reported that the patient has not been taking his medications recently.  ED Course: Upon arrival to the ED, patient is found to be afebrile, saturating mid 90s on room air, tachycardic to 120, and with stable blood pressure.  EKG features sinus tachycardia with PVCs.  Chest x-ray with mild stable right basilar scar or atelectasis.  Chemistry panel notable for glucose 308, bilirubin 2.7, and creatinine 1.30.  CBC features a leukocytosis to 15,700 and polycythemia with hemoglobin 19.0.  High-sensitivity troponin was 28 and 32.  Urinalysis with glucosuria, ketonuria, microscopic hematuria, and proteinuria.  Blood cultures were collected in the emergency department and the patient was given IV fluids and Azactam.  Review of Systems:  All other systems reviewed and apart from HPI, are negative.  Past Medical History:   Diagnosis Date  . Cellulitis and abscess of leg, except foot   . Hypertension   . Obstructive sleep apnea (adult) (pediatric)   . Pain in joint, pelvic region and thigh   . Pneumonia, organism unspecified(486)   . Restless legs syndrome (RLS)   . RLS (restless legs syndrome) 09/01/2012  . Thoracic or lumbosacral neuritis or radiculitis, unspecified   . Type II or unspecified type diabetes mellitus without mention of complication, not stated as uncontrolled   . Unspecified disease of pericardium   . Unspecified hereditary and idiopathic peripheral neuropathy     Past Surgical History:  Procedure Laterality Date  . ANAL FISSURE REPAIR    . pericarditis  2009    Social History:   reports that he quit smoking about 40 years ago. He has never used smokeless tobacco. He reports that he does not drink alcohol and does not use drugs.  Allergies  Allergen Reactions  . Oxycodone Anaphylaxis    Other reaction(s): Other (See Comments) Cannot recall specifics  . Iodinated Diagnostic Agents Hives    alleric to renografin,isovue & omnipaque, hives, requires 13 hr prep//a.calhoun, Onset Date: [id number]     . Iodine Hives and Rash    alleric to renografin,isovue & omnipaque, hives, requires 13 hr prep//a.calhoun, Onset Date: [id number] allergic to renografin,isovue & omnipaque, hives, requires 13 hr prep//a.calhoun, Onset Date: [id number]  . Linezolid Hives and Rash       . Methadone Hcl Other (See Comments)    HALLUCINATIONS   . Promethazine Other (See Comments)    Mental status change, DELIRIUM   Other reaction(s): erratic behavior Other reaction(s):  Other (See Comments) Mental status change DELIRIUM (pulled out IV)  . Morphine Sulfate Other (See Comments)    Pt not sure about allergy   . Other Hives    Other reaction(s): erratic behanvior Other reaction(s): erratic behavior  . Oxycodone Hcl Other (See Comments)    Pt not sure about allergy   . Penicillins Other (See Comments)     Pt not sure about allergy   . Quetiapine Other (See Comments)    Pt not sure about allergy  Other reaction(s): Other (See Comments) Pt not sure about allergy  Pt not sure about allergy   . Statins     Other reaction(s): muscle weakness Other reaction(s): muscle weakness  . Zolpidem Other (See Comments)    Other reaction(s): erratic behanvior Other reaction(s): Delusions (intolerance) Altered mental status  . Atorvastatin Itching and Other (See Comments)    Tired, weakness   . Carbidopa-Levodopa Anxiety  . Clindamycin Rash  . Colesevelam Other (See Comments)    tired   . Doxazosin Rash  . Lovastatin Other (See Comments)    Tired, nervousness   . Rosuvastatin Other (See Comments)    Tired, weakness     Family History  Problem Relation Age of Onset  . Diabetes Father      Prior to Admission medications   Medication Sig Start Date End Date Taking? Authorizing Provider  acetaminophen (ACETAMINOPHEN EXTRA STRENGTH) 500 MG tablet 1 tablet as needed    [provider]  acyclovir (ZOVIRAX) 800 MG tablet Take 800 mg by mouth daily as needed. 01/06/20   [provider]  allopurinol (ZYLOPRIM) 100 MG tablet Take 2 tablets daily. 02/03/17   Trula Slade, DPM  ALPRAZolam Duanne Moron) 0.5 MG tablet Take 0.5 mg by mouth 2 (two) times daily as needed for anxiety.  10/15/16   [provider]  amitriptyline (ELAVIL) 25 MG tablet  12/07/19   [provider]  amLODipine (NORVASC) 10 MG tablet Take 1 tablet by mouth daily. 07/06/13   [provider]  aspirin (ASPIRIN ADULT LOW DOSE) 81 MG EC tablet 1 tablet    [provider]  BD PEN NEEDLE NANO 2ND GEN 32G X 4 MM MISC FOR LANTUS AND VICTOZA PENS 11/30/19   [provider]  cetirizine (ZYRTEC ALLERGY) 10 MG tablet 1 tablet    [provider]  chlorthalidone (HYGROTON) 25 MG tablet TAKE 1/2 TABLET BY MOUTH EVERY DAY IN THE MORNING WITH FOOD 07/16/19   [provider]  ciprofloxacin (CIPRO) 500 MG tablet Take 500 mg by mouth 2 (two) times daily. 07/16/19   [provider]  clotrimazole-betamethasone (LOTRISONE) cream Apply topically 2 (two) times daily. 11/03/19   [provider]  co-enzyme Q-10 50 MG capsule Take 50 mg by mouth daily.    [provider]  cyproheptadine (PERIACTIN) 4 MG tablet Take by mouth. 01/15/20   [provider]  diltiazem (CARDIZEM CD) 120 MG 24 hr capsule Take 1 tablet by mouth daily.    [provider]  famotidine (PEPCID) 40 MG tablet Take 40 mg by mouth daily.  04/20/14   [provider]  ferrous sulfate 325 (65 FE) MG EC tablet Take 325 mg by mouth daily.  05/28/18   [provider]  fluconazole (DIFLUCAN) 200 MG tablet  05/19/19   [provider]  fluticasone (FLONASE) 50 MCG/ACT nasal spray Place 2 sprays into both nostrils daily. 06/21/20   [provider]  gabapentin (NEURONTIN) 300 MG capsule  2 tablet 2 04/27/19   [provider]  imipramine (TOFRANIL) 10 MG tablet Take by mouth. 12/17/19   [provider]  L-Methylfolate-Algae-B12-B6 Glade Stanford) 3-90.314-2-35 MG CAPS Take by mouth. 06/11/17   [provider]  LANTUS SOLOSTAR 100 UNIT/ML Solostar Pen Inject into the skin. 06/05/20   [provider]  liraglutide (VICTOZA) 18 MG/3ML SOPN Inject into the skin. 09/16/16   [provider]  magnesium citrate SOLN Take by mouth.    [provider]  metFORMIN (GLUCOPHAGE-XR) 500 MG 24 hr tablet 1 tablet 01/17/20   [provider]  nitroGLYCERIN (NITROSTAT) 0.4 MG SL tablet TAKE 1 TABLET UNDER THE TONGUE EVERY 5 MINUTES AS NEEDED FOR CHEST PAIN. Up to 3 doses. If no relief call 911 06/22/20   [provider]  ofloxacin (OCUFLOX) 0.3 % ophthalmic solution INSTILL 1 TO 2 DROPS IN LEFT EYE FOUR TIMES DAILY FOR ACUTE OPIOID THERAPY DAYS 03/07/19   [provider]  Omega-3 Fatty Acids (FISH OIL)  1000 MG CAPS Take by mouth.    [provider]  omeprazole (PRILOSEC) 40 MG capsule Take 40 mg by mouth daily. 02/07/20   [provider]  ondansetron (ZOFRAN-ODT) 4 MG disintegrating tablet Take 4 mg by mouth every 8 (eight) hours as needed. 02/07/20   [provider]  oxybutynin (DITROPAN) 5 MG tablet Take by mouth. 03/06/20   [provider]  OZEMPIC, 0.25 OR 0.5 MG/DOSE, 2 MG/1.5ML SOPN Inject into the skin. 01/17/20   [provider]  polyethylene glycol powder (MIRALAX) 17 GM/SCOOP powder 1 capful    [provider]  predniSONE (DELTASONE) 50 MG tablet Take by mouth. 11/17/19   [provider]  primidone (MYSOLINE) 50 MG tablet Take by mouth. 01/24/20   [provider]  Rotigotine (NEUPRO) 8 MG/24HR PT24 1 patch to skin    [provider]  Semaglutide,0.25 or 0.5MG /DOS, (OZEMPIC, 0.25 OR 0.5 MG/DOSE,) 2 MG/1.5ML SOPN Inject into the skin. 01/17/20   [provider]  Semaglutide,0.25 or 0.5MG /DOS, (OZEMPIC, 0.25 OR 0.5 MG/DOSE,) 2 MG/1.5ML SOPN Titrate 0.25 mg once weekly for 4 weeks then 0.5 mg once weekly for 2 weeks    [provider]  sulfamethoxazole-trimethoprim (BACTRIM DS) 800-160 MG tablet Take 1 tablet by mouth 2 (two) times daily. 03/06/20   [provider]  tadalafil (CIALIS) 5 MG tablet Take 1 tablet by mouth daily. 04/13/20   [provider]  valsartan (DIOVAN) 320 MG tablet 1 tablet    [provider]    Physical Exam: Vitals:   09/09/20 0200 09/09/20 0330 09/09/20 0430 09/09/20 0500  BP: (!) 147/84 (!) 141/88 (!) 129/107 (!) 141/66  Pulse: (!) 120 (!) 122 (!) 118 (!) 118  Resp: 20 20 20  (!) 21  Temp:      TempSrc:      SpO2: 94% 96% 93% 98%  Weight:      Height:        Constitutional: NAD, anxious  Eyes: PERTLA, lids and conjunctivae normal ENMT: Mucous membranes are moist. Posterior pharynx clear of any exudate or lesions.   Neck: supple, no  masses  Respiratory:  no wheezing, no crackles. No accessory muscle use.  Cardiovascular: Rate ~120 and regular. No extremity edema.   Abdomen: No distension, no tenderness, soft. Bowel sounds active.  Musculoskeletal: no clubbing / cyanosis. No joint deformity upper and lower extremities.   Skin: no significant rashes, lesions, ulcers. Sweaty, well-perfused. Neurologic: CN 2-12 grossly intact. Sensation intact.  Moving all extremities.  Psychiatric: Alert and oriented to person, place, and situation. Severely anxious.    Labs and Imaging on Admission: I have personally reviewed following labs and imaging studies  CBC: Recent Labs  Lab 09/08/20 2308 09/08/20 2339  WBC 15.7*  --   NEUTROABS 14.0*  --   HGB 19.0* 20.1*  HCT 58.5* 59.0*  MCV 87.2  --   PLT 200  --    Basic Metabolic Panel: Recent Labs  Lab 09/08/20 2339  NA 134*  K 4.3  CL 98  GLUCOSE 308*  BUN 39*  CREATININE 1.30*   GFR: Estimated Creatinine Clearance: 42 mL/min (A) (by C-G formula based on SCr of 1.3 mg/dL (H)). Liver Function Tests: Recent Labs  Lab 09/08/20 2308  AST 27  ALT 32  ALKPHOS 65  BILITOT 2.7*  PROT 7.6  ALBUMIN 4.3   No results for input(s): LIPASE, AMYLASE in the last 168 hours. No results for input(s): AMMONIA in the last 168 hours. Coagulation Profile: No results for input(s): INR, PROTIME in the last 168 hours. Cardiac Enzymes: No results for input(s): CKTOTAL, CKMB, CKMBINDEX, TROPONINI in the last 168 hours. BNP (last 3 results) No results for input(s): PROBNP in the last 8760 hours. HbA1C: No results for input(s): HGBA1C in the last 72 hours. CBG: Recent Labs  Lab 09/09/20 0151  GLUCAP 298*   Lipid Profile: No results for input(s): CHOL, HDL, LDLCALC, TRIG, CHOLHDL, LDLDIRECT in the last 72 hours. Thyroid Function Tests: No results for input(s): TSH, T4TOTAL, FREET4, T3FREE, THYROIDAB in the last 72 hours. Anemia Panel: No results for input(s): VITAMINB12,  FOLATE, FERRITIN, TIBC, IRON, RETICCTPCT in the last 72 hours. Urine analysis:    Component Value Date/Time   COLORURINE YELLOW 09/08/2020 2335   APPEARANCEUR HAZY (A) 09/08/2020 2335   LABSPEC 1.017 09/08/2020 2335   PHURINE 5.0 09/08/2020 2335   GLUCOSEU >=500 (A) 09/08/2020 2335   HGBUR MODERATE (A) 09/08/2020 2335   BILIRUBINUR NEGATIVE 09/08/2020 2335   KETONESUR 80 (A) 09/08/2020 2335   PROTEINUR >=300 (A) 09/08/2020 2335   UROBILINOGEN 0.2 12/04/2013 0210   NITRITE NEGATIVE 09/08/2020 2335   LEUKOCYTESUR TRACE (A) 09/08/2020 2335   Sepsis Labs: @LABRCNTIP (procalcitonin:4,lacticidven:4) ) Recent Results (from the past 240 hour(s))  Resp Panel by RT-PCR (Flu A&B, Covid) Nasopharyngeal Swab     Status: None   Collection Time: 09/08/20 11:41 PM   Specimen: Nasopharyngeal Swab; Nasopharyngeal(NP) swabs in vial transport medium  Result Value Ref Range Status   SARS Coronavirus 2 by RT PCR NEGATIVE NEGATIVE Final    Comment: (NOTE) SARS-CoV-2 target nucleic acids are NOT DETECTED.  The SARS-CoV-2 RNA is generally detectable in upper respiratory specimens during the acute phase of infection. The lowest concentration of SARS-CoV-2 viral copies this assay can detect is 138 copies/mL. A negative result does not preclude SARS-Cov-2 infection and should not be used as the sole basis for treatment or other patient management decisions. A negative result may occur with  improper specimen collection/handling, submission of specimen other than nasopharyngeal swab, presence of viral mutation(s) within the areas targeted by this assay, and inadequate number of viral copies(<138 copies/mL). A negative result must be combined with clinical observations, patient history, and epidemiological information. The expected result is Negative.  Fact Sheet for Patients:  EntrepreneurPulse.com.au  Fact Sheet for Healthcare Providers:   IncredibleEmployment.be  This test is no t yet approved or cleared by the [place] FDA and  has been authorized for detection and/or diagnosis  of SARS-CoV-2 by FDA under an Emergency Use Authorization (EUA). This EUA will remain  in effect (meaning this test can be used) for the duration of the COVID-19 declaration under Section 564(b)(1) of the Act, 21 U.S.C.section 360bbb-3(b)(1), unless the authorization is terminated  or revoked sooner.       Influenza A by PCR NEGATIVE NEGATIVE Final   Influenza B by PCR NEGATIVE NEGATIVE Final    Comment: (NOTE) The Xpert Xpress SARS-CoV-2/FLU/RSV plus assay is intended as an aid in the diagnosis of influenza from Nasopharyngeal swab specimens and should not be used as a sole basis for treatment. Nasal washings and aspirates are unacceptable for Xpert Xpress SARS-CoV-2/FLU/RSV testing.  Fact Sheet for Patients: EntrepreneurPulse.com.au  Fact Sheet for Healthcare Providers: IncredibleEmployment.be  This test is not yet approved or cleared by the [place] FDA and has been authorized for detection and/or diagnosis of SARS-CoV-2 by FDA under an Emergency Use Authorization (EUA). This EUA will remain in effect (meaning this test can be used) for the duration of the COVID-19 declaration under Section 564(b)(1) of the Act, 21 U.S.C. section 360bbb-3(b)(1), unless the authorization is terminated or revoked.  Performed at [facility], [place] [facility] [address] [place], [place] [address]      Radiological Exams on Admission: DG Chest Portable 1 View  Result Date: 09/08/2020 CLINICAL DATA:  Anxiety with nausea and cough. EXAM: PORTABLE CHEST 1 VIEW COMPARISON:  April 26, 2019 FINDINGS: Low lung volumes are seen which is likely secondary to the degree of patient inspiration. Mild, stable linear scarring and/or atelectasis is seen within the right lung base. There is no  evidence of a pleural effusion or pneumothorax. The heart size and mediastinal contours are within normal limits. Mild to moderate severity calcification of the thoracic aorta is seen. Degenerative changes seen involving the left shoulder. IMPRESSION: Mild, stable right basilar linear scarring and/or atelectasis. Electronically Signed   By: Virgina Norfolk M.D.   On: 09/08/2020 23:46    EKG: Independently reviewed. Sinus tachycardia, rate 125, PVCs.   Assessment/Plan   1. Panic  - Patient has hx of anxiety and has been increasingly anxious, diaphoretic, and reporting that he feels like he is dying for roughly 2 weeks now  - Patient reports recently stopping Xanax and benzodiazepine withdrawal was considered but he has not been prescribed Xanax since Aug 2020 per database review  - He was given Valium in ED  - Check TSH, urine metanephrines and catecholamines, follow-up pharmacy medication reconciliation    2. SIRS  - WBC is 15.7 with tachycardia in ED  - There is no fever, no acute CXR findings or respiratory complaints, he denies dysuria or flank pain, no meningismus, no rash, and abdominal exam is benign  - Blood cultures were collected in ED and he was treated with Azactam for abnormal UA  - Plan to culture urine, follow blood cultures, monitor off of antibiotics   3. Elevated troponin  - HS troponin 28 then 32  - He denies chest pain, appears to chronic elevation in this range, and there is low-suspicion for ACS    4. OSA  - Continue CPAP qHS    5. Parkinson disease  - Unable to confirm current medications, please follow-up on pharmacy medication-reconciliation    6. CKD IIIa  - SCR is 1.30 on admission, similar to priors  - Renally-dose medications, monitor    7. Insulin-dependent DM  - A1c was 6.5% in May 2022  - Continue CBG checks  and insulin     DVT prophylaxis: sq heparin  Code Status: Full  Level of Care: Level of care: Telemetry Family Communication: Spouse not  answering phone   Disposition Plan:  Patient is from: home  Anticipated d/c is to: TBD Anticipated d/c date is: Possibly as early as 09/10/20 Patient currently: Pending pharmacy medication-reconciliation, repeat labs, possible psychiatry consultation  Consults called: None  Admission status: Observation     Vianne Bulls, MD [facility]  09/09/2020, 6:17 AM

## 2020-09-09 NOTE — Progress Notes (Addendum)
PROGRESS NOTE    Jason Hartman  WUJ:[id number] DOB: [birth year] DOA: 09/08/2020 PCP: [name], MD   Chief Complaint  Patient presents with  . Anxiety  . Nausea   Brief Narrative:   Jason Hartman is Jason Hartman 85 y.o. male with medical history significant for OSA, Parkinson's, restless leg syndrome, anxiety, chronic pain, chronic kidney disease, and insulin-dependent diabetes mellitus, now presenting to the emergency department for evaluation of anxiety and diaphoresis.  Patient has Jason Hartman long history of anxiety and per report of his family, has been increasingly anxious over the past couple weeks, has been saying that he feels like he is going to die, and has been sweating profusely.  Patient states that he feels "horrible," but has difficulty providing any more specificity though he denies chest pain, abdominal pain, flank pain, cough, dysuria, vomiting, or diarrhea.  He also denies hallucinations or suicidal ideation.  Patient states that he recently was told to stop taking Xanax.  He states that he was started on [name] different medication for anxiety but does not remember the name of it.  Family reported that the patient has not been taking his medications recently.  ED Course: Upon arrival to the ED, patient is found to be afebrile, saturating mid 90s on room air, tachycardic to 120, and with stable blood pressure.  EKG features sinus tachycardia with PVCs.  Chest x-ray with mild stable right basilar scar or atelectasis.  Chemistry panel notable for glucose 308, bilirubin 2.7, and creatinine 1.30.  CBC features Jason Hartman leukocytosis to 15,700 and polycythemia with hemoglobin 19.0.  High-sensitivity troponin was 28 and 32.  Urinalysis with glucosuria, ketonuria, microscopic hematuria, and proteinuria.  Blood cultures were collected in the emergency department and the patient was given IV fluids and Azactam.  Assessment & Plan:   Principal Problem:   Anxiety Active Problems:   Type II diabetes mellitus ([place])    OBSTRUCTIVE SLEEP APNEA   Parkinson disease ([place])   Chronic kidney disease, stage 3a ([place])   Malignant HTN with heart disease, w/o CHF, w/o chronic kidney disease   Elevated troponin   SIRS (systemic inflammatory response syndrome) ([place])  2. SIRS  - WBC is 15.7 with tachycardia in ED  - EKG shows sinus tach - There is no fever, no acute CXR findings or respiratory complaints, he denies dysuria or flank pain, no meningismus, no rash, and abdominal exam is benign  - Blood cultures were collected in ED and he was treated with Azactam for abnormal UA  - Plan to culture urine, follow blood cultures, monitor off of antibiotics for now - follow sed rate, CRP, CK - he's started tramadol about 4 weeks ago, cymbalta and buspar around 2 weeks ago - constellation of symptoms (agitation, anxiety, flushed, diaphoretic, tachycardia) and his medicines are somewhat concerning for serotonin syndrome? (though exam not clearly indicative of this -- he was not clearly hyperreflexic on my exam and did not have clear inducible clonus for me - no rigidity either).  Hold tramadol, buspar, cymbalta.  Ativan prn.    1. Panic  - Patient has hx of anxiety and has been increasingly anxious, diaphoretic, and reporting that he feels like he is dying for roughly 2 weeks now  - Patient reports recently stopping Xanax and benzodiazepine withdrawal was considered but he has not been prescribed Xanax since Aug 2020 per database review  - He was given Valium in ED  - Check TSH (wnl), urine metanephrines and catecholamines, follow-up pharmacy medication reconciliation   -  prn ativan for now  3. Elevated troponin  Elevated D dimer  - HS troponin 28 then 32  - echo is without wall motion abnormalities, EF 55-60% (see report) - follow LE [place] (negative), will get VQ scan - He denies chest pain, appears to chronic elevation in this range, and there is low-suspicion for ACS    4. OSA  - Continue CPAP qHS    5. Parkinson  disease  - continue neupro   6. CKD IIIa  - SCR is 1.30 on admission, similar to priors  - Renally-dose medications, monitor    7. Insulin-dependent DM  - A1c was 6.5% in May 2022  - Continue CBG checks and insulin    # Elevated Bilirubin - RUQ [place]  # Hypertension - resume diltiazem   DVT prophylaxis: heparin  Code Status: full  Family Communication: discussed with daughter Disposition:   Status is: Observation  The patient remains OBS appropriate and will d/c before 2 midnights.  Dispo: The patient is from: Home              Anticipated d/c is to: Home              Patient currently is not medically stable to d/c.   Difficult to place patient No       Consultants:   none  Procedures:  Echo IMPRESSIONS    1. Left ventricular ejection fraction, by estimation, is 55 to 60%. The  left ventricle has normal function. The left ventricle has no regional  wall motion abnormalities. There is mild left ventricular hypertrophy.  Left ventricular diastolic parameters  are consistent with Grade I diastolic dysfunction (impaired relaxation).  2. Right ventricular systolic function is normal. The right ventricular  size is normal. Tricuspid regurgitation signal is inadequate for assessing  PA pressure.  3. Left atrial size was mildly dilated.  4. The mitral valve is grossly normal. Trivial mitral valve  regurgitation.  5. The aortic valve is tricuspid. There is mild calcification of the  aortic valve. Aortic valve regurgitation is trivial. Mild aortic valve  sclerosis is present, with no evidence of aortic valve stenosis.  Antimicrobials:  Anti-infectives (From admission, onward)   Start     Dose/Rate Route Frequency Ordered Stop   09/09/20 0115  aztreonam (AZACTAM) 1 g in sodium chloride 0.9 % 100 mL IVPB        1 g 200 mL/hr over 30 Minutes Intravenous  Once 09/09/20 0106 09/09/20 0307         Subjective: asking for help using bathroom Asks me to ask  nurse if they can help him nurse to help him to bathroom   Objective: Vitals:   09/09/20 1100 09/09/20 1130 09/09/20 1200 09/09/20 1230  BP: 135/78 (!) 155/91 137/83 135/70  Pulse: 82 (!) 57 (!) 107 (!) 101  Resp: 16 (!) 32 13 12  Temp:      TempSrc:      SpO2: 95% 91% 99% 94%  Weight:      Height:        Intake/Output Summary (Last 24 hours) at 09/09/2020 1325 Last data filed at 09/09/2020 0307 Gross per 24 hour  Intake 600 ml  Output --  Net 600 ml   Filed Weights   09/08/20 2205  Weight: 80.7 kg    Examination:  General exam: Appears anxious Respiratory system: Clear to auscultation. Respiratory effort normal. Cardiovascular system: S1 & S2 heard, RRR. Gastrointestinal system: Abdomen is nondistended, soft and nontender.  Central nervous system: Alert - anxious - asking for help using bathroom. No focal neurological deficits.  No rigidity.  No clear hyperrelexia able to be induced.  Spontaneous whole leg rhythmic movements noticed on few occasions and when trying to illicit babinski.   Extremities: no LEE    Data Reviewed: I have personally reviewed following labs and imaging studies  CBC: Recent Labs  Lab 09/08/20 2308 09/08/20 2339  WBC 15.7*  --   NEUTROABS 14.0*  --   HGB 19.0* 20.1*  HCT 58.5* 59.0*  MCV 87.2  --   PLT 200  --     Basic Metabolic Panel: Recent Labs  Lab 09/08/20 2339  NA 134*  K 4.3  CL 98  GLUCOSE 308*  BUN 39*  CREATININE 1.30*    GFR: Estimated Creatinine Clearance: 42 mL/min (Tosha Belgarde) (by C-G formula based on SCr of 1.3 mg/dL (H)).  Liver Function Tests: Recent Labs  Lab 09/08/20 2308  AST 27  ALT 32  ALKPHOS 65  BILITOT 2.7*  PROT 7.6  ALBUMIN 4.3    CBG: Recent Labs  Lab 09/09/20 0151 09/09/20 0725 09/09/20 1122  GLUCAP 298* 193* 121*     Recent Results (from the past 240 hour(s))  Resp Panel by RT-PCR (Flu Destanie Tibbetts&B, Covid) Nasopharyngeal Swab     Status: None   Collection Time: 09/08/20 11:41 PM   Specimen:  Nasopharyngeal Swab; Nasopharyngeal(NP) swabs in vial transport medium  Result Value Ref Range Status   SARS Coronavirus 2 by RT PCR NEGATIVE NEGATIVE Final    Comment: (NOTE) SARS-CoV-2 target nucleic acids are NOT DETECTED.  The SARS-CoV-2 RNA is generally detectable in upper respiratory specimens during the acute phase of infection. The lowest concentration of SARS-CoV-2 viral copies this assay can detect is 138 copies/mL. Emony Dormer negative result does not preclude SARS-Cov-2 infection and should not be used as the sole basis for treatment or other patient management decisions. Marisabel Macpherson negative result may occur with  improper specimen collection/handling, submission of specimen other than nasopharyngeal swab, presence of viral mutation(s) within the areas targeted by this assay, and inadequate number of viral copies(<138 copies/mL). Jasmen Emrich negative result must be combined with clinical observations, patient history, and epidemiological information. The expected result is Negative.  Fact Sheet for Patients:  EntrepreneurPulse.com.au  Fact Sheet for Healthcare Providers:  IncredibleEmployment.be  This test is no t yet approved or cleared by the [place] FDA and  has been authorized for detection and/or diagnosis of SARS-CoV-2 by FDA under an Emergency Use Authorization (EUA). This EUA will remain  in effect (meaning this test can be used) for the duration of the COVID-19 declaration under Section 564(b)(1) of the Act, 21 U.S.C.section 360bbb-3(b)(1), unless the authorization is terminated  or revoked sooner.       Influenza Donalee Gaumond by PCR NEGATIVE NEGATIVE Final   Influenza B by PCR NEGATIVE NEGATIVE Final    Comment: (NOTE) The Xpert Xpress SARS-CoV-2/FLU/RSV plus assay is intended as an aid in the diagnosis of influenza from Nasopharyngeal swab specimens and should not be used as Jaynell Castagnola sole basis for treatment. Nasal washings and aspirates are unacceptable for  Xpert Xpress SARS-CoV-2/FLU/RSV testing.  Fact Sheet for Patients: EntrepreneurPulse.com.au  Fact Sheet for Healthcare Providers: IncredibleEmployment.be  This test is not yet approved or cleared by the [place] FDA and has been authorized for detection and/or diagnosis of SARS-CoV-2 by FDA under an Emergency Use Authorization (EUA). This EUA will remain in effect (meaning this test can be used) for  the duration of the COVID-19 declaration under Section 564(b)(1) of the Act, 21 U.S.C. section 360bbb-3(b)(1), unless the authorization is terminated or revoked.  Performed at [facility], [place] [address], [place], [place] [address]          Radiology Studies: DG Chest Portable 1 View  Result Date: 09/08/2020 CLINICAL DATA:  Anxiety with nausea and cough. EXAM: PORTABLE CHEST 1 VIEW COMPARISON:  April 26, 2019 FINDINGS: Low lung volumes are seen which is likely secondary to the degree of patient inspiration. Mild, stable linear scarring and/or atelectasis is seen within the right lung base. There is no evidence of Jamarques Pinedo pleural effusion or pneumothorax. The heart size and mediastinal contours are within normal limits. Mild to moderate severity calcification of the thoracic aorta is seen. Degenerative changes seen involving the left shoulder. IMPRESSION: Mild, stable right basilar linear scarring and/or atelectasis. Electronically Signed   By: Virgina Norfolk M.D.   On: 09/08/2020 23:46   ECHOCARDIOGRAM COMPLETE  Result Date: 09/09/2020    ECHOCARDIOGRAM REPORT   Patient Name:   Jason Hartman Date of Exam: 09/09/2020 Medical Rec #:  [id number]     Height:       64.0 in Accession #:    [id number]    Weight:       178.0 lb Date of Birth:  1935/06/21    BSA:          1.862 m Patient Age:    42 years      BP:           155/91 mmHg Patient Gender: M             HR:           104 bpm. Exam Location:  Inpatient Procedure: 2D Echo, Cardiac  Doppler and Color Doppler Indications:    Other abnormalities of the heart R00.8  History:        Patient has prior history of Echocardiogram examinations, most                 recent 04/27/2019. Risk Factors:Hypertension. Patient has been                 sweating profusely for four months without fever per patient.  Sonographer:    Merrie Roof RDCS Referring Phys: [place]  1. Left ventricular ejection fraction, by estimation, is 55 to 60%. The left ventricle has normal function. The left ventricle has no regional wall motion abnormalities. There is mild left ventricular hypertrophy. Left ventricular diastolic parameters are consistent with Grade I diastolic dysfunction (impaired relaxation).  2. Right ventricular systolic function is normal. The right ventricular size is normal. Tricuspid regurgitation signal is inadequate for assessing PA pressure.  3. Left atrial size was mildly dilated.  4. The mitral valve is grossly normal. Trivial mitral valve regurgitation.  5. The aortic valve is tricuspid. There is mild calcification of the aortic valve. Aortic valve regurgitation is trivial. Mild aortic valve sclerosis is present, with no evidence of aortic valve stenosis. FINDINGS  Left Ventricle: Left ventricular ejection fraction, by estimation, is 55 to 60%. The left ventricle has normal function. The left ventricle has no regional wall motion abnormalities. The left ventricular internal cavity size was normal in size. There is  mild left ventricular hypertrophy. Left ventricular diastolic parameters are consistent with Grade I diastolic dysfunction (impaired relaxation). Right Ventricle: The right ventricular size is normal. No increase in right ventricular wall thickness.  Right ventricular systolic function is normal. Tricuspid regurgitation signal is inadequate for assessing PA pressure. Left Atrium: Left atrial size was mildly dilated. Right Atrium: Right atrial size was normal in  size. Pericardium: There is no evidence of pericardial effusion. Mitral Valve: The mitral valve is grossly normal. Mild mitral annular calcification. Trivial mitral valve regurgitation. Tricuspid Valve: The tricuspid valve is grossly normal. Tricuspid valve regurgitation is trivial. Aortic Valve: The aortic valve is tricuspid. There is mild calcification of the aortic valve. There is mild aortic valve annular calcification. Aortic valve regurgitation is trivial. Mild aortic valve sclerosis is present, with no evidence of aortic valve stenosis. Pulmonic Valve: The pulmonic valve was grossly normal. Pulmonic valve regurgitation is trivial. Aorta: The aortic root is normal in size and structure. IAS/Shunts: The interatrial septum appears to be lipomatous. No atrial level shunt detected by color flow Doppler.  LEFT VENTRICLE PLAX 2D LVIDd:         3.30 cm  Diastology LVIDs:         2.80 cm  LV e' medial:    5.44 cm/s LV PW:         1.30 cm  LV E/e' medial:  13.2 LV IVS:        1.10 cm  LV e' lateral:   8.05 cm/s LVOT diam:     2.00 cm  LV E/e' lateral: 8.9 LV SV:         46 LV SV Index:   25 LVOT Area:     3.14 cm  LEFT ATRIUM             Index LA diam:        4.00 cm 2.15 cm/m LA Vol (A2C):   59.8 ml 32.12 ml/m LA Vol (A4C):   65.6 ml 35.23 ml/m LA Biplane Vol: 65.1 ml 34.97 ml/m  AORTIC VALVE LVOT Vmax:   102.00 cm/s LVOT Vmean:  73.500 cm/s LVOT VTI:    0.146 m  AORTA Ao Root diam: 3.60 cm MITRAL VALVE MV Area (PHT): 3.53 cm     SHUNTS MV Decel Time: 215 msec     Systemic VTI:  0.15 m MV E velocity: 72.00 cm/s   Systemic Diam: 2.00 cm MV Ramsey Guadamuz velocity: 116.00 cm/s MV E/Tashay Bozich ratio:  0.62 Rozann Lesches MD Electronically signed by Rozann Lesches MD Signature Date/Time: 09/09/2020/1:20:33 PM    Final         Scheduled Meds: . heparin  5,000 Units Subcutaneous Q8H  . insulin aspart  0-15 Units Subcutaneous TID WC  . insulin aspart  0-5 Units Subcutaneous QHS  . insulin glargine  50 Units Subcutaneous BID    Continuous Infusions: . sodium chloride 100 mL/hr at 09/09/20 0625     LOS: 0 days    Time spent: over 30 min    Fayrene Helper, MD [facility]   To contact the attending provider between 7A-7P or the covering provider during after hours 7P-7A, please log into the web site [email or web address] and access using universal [place] password for that web site. If you do not have the password, please call the hospital operator.  09/09/2020, 1:25 PM

## 2020-09-09 NOTE — ED Notes (Signed)
Patient is ready for [place], RN has been notified to check admission information

## 2020-09-09 NOTE — ED Notes (Signed)
Patient is resting quietly.  Awaiting admission

## 2020-09-09 NOTE — Progress Notes (Signed)
Bilateral lower extremity venous duplex has been completed. Preliminary results can be found in CV Proc through chart review.  Results were given to the patient's nurse, Caren Griffins.  09/09/20 1:23 PM Carlos Levering RVT

## 2020-09-09 NOTE — ED Notes (Signed)
Updated  The patient's wife Jason Hartman and she is going to update the son, Jason Hartman

## 2020-09-09 NOTE — Progress Notes (Signed)
PHARMACY - PHYSICIAN COMMUNICATION CRITICAL VALUE ALERT - BLOOD CULTURE IDENTIFICATION (BCID)  Jason Hartman is an 85 y.o. male who presented to [facility] on 09/08/2020 with a chief complaint of anxiety  Assessment:   Presented with general feeling bad but unable to provide further description per notes.   Given Aztreonam x1 for possible UTI in ED, however currently monitoring off antibiotics since patient without urinary symptoms and no other source of infection identified.   He remains afebrile with mild leukocytosis.   Aerobic bottle of 1 blood cx set is now + [facility] (BCID + MRSE).  Likely contaminant.   Name of physician (or Provider) Contacted: Ardith Dark  Current antibiotics: none  Changes to prescribed antibiotics recommended:  No changes recommended.  Continue to monitor off antibiotics.   Results for orders placed or performed during the hospital encounter of 09/08/20  Blood Culture ID Panel (Reflexed) (Collected: 09/09/2020  1:03 AM)  Result Value Ref Range   Enterococcus faecalis NOT DETECTED NOT DETECTED   Enterococcus Faecium NOT DETECTED NOT DETECTED   Listeria monocytogenes NOT DETECTED NOT DETECTED   Staphylococcus species DETECTED (A) NOT DETECTED   Staphylococcus aureus (BCID) NOT DETECTED NOT DETECTED   Staphylococcus epidermidis DETECTED (A) NOT DETECTED   Staphylococcus lugdunensis NOT DETECTED NOT DETECTED   Streptococcus species NOT DETECTED NOT DETECTED   Streptococcus agalactiae NOT DETECTED NOT DETECTED   Streptococcus pneumoniae NOT DETECTED NOT DETECTED   Streptococcus pyogenes NOT DETECTED NOT DETECTED   A.calcoaceticus-baumannii NOT DETECTED NOT DETECTED   Bacteroides fragilis NOT DETECTED NOT DETECTED   Enterobacterales NOT DETECTED NOT DETECTED   Enterobacter cloacae complex NOT DETECTED NOT DETECTED   Escherichia coli NOT DETECTED NOT DETECTED   Klebsiella aerogenes NOT DETECTED NOT DETECTED   Klebsiella oxytoca NOT DETECTED NOT DETECTED   Klebsiella  pneumoniae NOT DETECTED NOT DETECTED   Proteus species NOT DETECTED NOT DETECTED   Salmonella species NOT DETECTED NOT DETECTED   Serratia marcescens NOT DETECTED NOT DETECTED   Haemophilus influenzae NOT DETECTED NOT DETECTED   Neisseria meningitidis NOT DETECTED NOT DETECTED   Pseudomonas aeruginosa NOT DETECTED NOT DETECTED   Stenotrophomonas maltophilia NOT DETECTED NOT DETECTED   Candida albicans NOT DETECTED NOT DETECTED   Candida auris NOT DETECTED NOT DETECTED   Candida glabrata NOT DETECTED NOT DETECTED   Candida krusei NOT DETECTED NOT DETECTED   Candida parapsilosis NOT DETECTED NOT DETECTED   Candida tropicalis NOT DETECTED NOT DETECTED   Cryptococcus neoformans/gattii NOT DETECTED NOT DETECTED   Methicillin resistance mecA/C DETECTED (A) NOT DETECTED    Netta Cedars  PharmD 09/09/2020  10:50 PM

## 2020-09-09 NOTE — ED Notes (Signed)
Echo at bedside

## 2020-09-09 NOTE — ED Notes (Signed)
Ultrasound at bedside

## 2020-09-09 NOTE — ED Notes (Signed)
Patient peed on the floor. With the urinal being at the bedside.

## 2020-09-09 NOTE — ED Notes (Signed)
Patient requested that the nurse call his wife and tell her to come to the hospital to sit with him.  I did call his wife again, and she reports that she will be here when he gets an admission bed due to her health conditions, she does not want to sit in the ER

## 2020-09-09 NOTE — Progress Notes (Addendum)
Hypoglycemic Event  CBG: 68   Treatment:4oz. Juice given per protocol   Symptoms: diaphoretic, dizziness   Follow-up CBG: Time: 1803   CBG Result: 119  Possible Reasons for Event: poor po intake   Comments/MD notified: Dr. Florene Glen, see new orders.     Rueben Bash

## 2020-09-09 NOTE — Progress Notes (Signed)
Hypoglycemic Event  CBG: 56  Treatment: 4oz. Juice given per protocol.   Symptoms:diaphoretic, dizziness   Follow-up CBG: Time: 1733  CBG Result: 68  Possible Reasons for Event: poor PO intake   Comments/MD notified: Dr. Florene Glen made aware see new orders.     Jason Hartman

## 2020-09-09 NOTE — ED Provider Notes (Signed)
[facility] DEPT Provider Note   CSN: [id number] Arrival date & time: 09/08/20  2149     History Chief Complaint  Patient presents with  . Anxiety  . Nausea    Jason Hartman is a 85 y.o. male.  The history is provided by the patient and a relative. The history is limited by the condition of the patient.  Anxiety This is a chronic problem. The current episode started more than 1 week ago. The problem occurs constantly. Pertinent negatives include no chest pain, no abdominal pain and no headaches. Nothing aggravates the symptoms. Nothing relieves the symptoms. He has tried nothing for the symptoms. The treatment provided no relief.  Patient presents with a multiple complaints including anxiety, cough,  Nausea without emesis, diaphoresis for a few weeks and poor PO intake.      Past Medical History:  Diagnosis Date  . Cellulitis and abscess of leg, except foot   . Hypertension   . Obstructive sleep apnea (adult) (pediatric)   . Pain in joint, pelvic region and thigh   . Pneumonia, organism unspecified(486)   . Restless legs syndrome (RLS)   . RLS (restless legs syndrome) 09/01/2012  . Thoracic or lumbosacral neuritis or radiculitis, unspecified   . Type II or unspecified type diabetes mellitus without mention of complication, not stated as uncontrolled   . Unspecified disease of pericardium   . Unspecified hereditary and idiopathic peripheral neuropathy     Patient Active Problem List   Diagnosis Date Noted  . Slow transit constipation 08/07/2020  . Abdominal aortic aneurysm without rupture ([place]) 03/17/2020  . Abnormal gait 03/17/2020  . Ascending aorta dilatation (H[facility]) 03/17/2020  . Diabetic renal disease ([place]) 03/17/2020  . Recurrent UTI 07/16/2019  . Hypertensive urgency 04/26/2019  . Malignant HTN with heart disease, w/o CHF, w/o chronic kidney disease 04/26/2019  . Elevated troponin   . LFTs abnormal   . Statin intolerance 01/15/2019  .  Penis pain 04/10/2018  . Urethra cancer ([place]) 02/25/2018  . Lower extremity edema 06/09/2017  . Peptic ulcer disease 05/16/2017  . GERD (gastroesophageal reflux disease) 05/16/2017  . Chronic kidney disease, stage 3 ([place]) 04/11/2017  . Lower urinary tract symptoms (LUTS) 04/15/2016  . Narcotic dependence ([place]) 08/31/2015  . Imbalance 08/31/2015  . Diabetic peripheral neuropathy ([place]) 08/31/2015  . Other malaise and fatigue 05/02/2015  . Chronic fatigue 05/02/2015  . Panic disorder without agoraphobia 03/22/2015  . Hyperlipidemia 02/03/2014  . Thrombocytopenia ([place]) 08/18/2013  . Parkinson disease ([place]) 08/18/2013  . Other disorders of lung 08/18/2013  . Organic impotence 08/18/2013  . Memory loss 08/18/2013  . Low back pain 08/18/2013  . Iron deficiency anemia 08/18/2013  . Hypercalcemia 08/18/2013  . Cellulitis of right leg 08/18/2013  . Atherosclerotic heart disease of native coronary artery without angina pectoris 08/18/2013  . RLS (restless legs syndrome) 09/01/2012  . HERPES SIMPLEX INFECTION 11/06/2006  . DIABETES MELLITUS, TYPE II 11/06/2006  . HYPOGONADISM 11/06/2006  . VITAMIN D DEFICIENCY 11/06/2006  . ANXIETY 11/06/2006  . OBSTRUCTIVE SLEEP APNEA 11/06/2006  . RESTLESS LEG SYNDROME 11/06/2006  . HYPERTENSION 11/06/2006  . BENIGN PROSTATIC HYPERTROPHY 11/06/2006  . ROSACEA 11/06/2006  . OSTEOARTHRITIS 11/06/2006  . INSOMNIA 11/06/2006  . HYPERGLYCEMIA 11/06/2006  . COLONIC POLYPS, HX OF 11/06/2006    Past Surgical History:  Procedure Laterality Date  . ANAL FISSURE REPAIR    . pericarditis  2009       Family History  Problem Relation Age of  Onset  . Diabetes Father     Social History   Tobacco Use  . Smoking status: Former Smoker    Quit date: 09/01/1980    Years since quitting: 40.0  . Smokeless tobacco: Never Used  Vaping Use  . Vaping Use: Never used  Substance Use Topics  . Alcohol use: No  . Drug use: No    Home Medications Prior to  Admission medications   Medication Sig Start Date End Date Taking? Authorizing Provider  acetaminophen (ACETAMINOPHEN EXTRA STRENGTH) 500 MG tablet 1 tablet as needed    [provider]  acyclovir (ZOVIRAX) 800 MG tablet Take 800 mg by mouth daily as needed. 01/06/20   [provider]  allopurinol (ZYLOPRIM) 100 MG tablet Take 2 tablets daily. 02/03/17   Trula Slade, DPM  ALPRAZolam Duanne Moron) 0.5 MG tablet Take 0.5 mg by mouth 2 (two) times daily as needed for anxiety.  10/15/16   [provider]  amitriptyline (ELAVIL) 25 MG tablet  12/07/19   [provider]  amLODipine (NORVASC) 10 MG tablet Take 1 tablet by mouth daily. 07/06/13   [provider]  aspirin (ASPIRIN ADULT LOW DOSE) 81 MG EC tablet 1 tablet    [provider]  BD PEN NEEDLE NANO 2ND GEN 32G X 4 MM MISC FOR LANTUS AND VICTOZA PENS 11/30/19   [provider]  cetirizine (ZYRTEC ALLERGY) 10 MG tablet 1 tablet    [provider]  chlorthalidone (HYGROTON) 25 MG tablet TAKE 1/2 TABLET BY MOUTH EVERY DAY IN THE MORNING WITH FOOD 07/16/19   [provider]  ciprofloxacin (CIPRO) 500 MG tablet Take 500 mg by mouth 2 (two) times daily. 07/16/19   [provider]  clotrimazole-betamethasone (LOTRISONE) cream Apply topically 2 (two) times daily. 11/03/19   [provider]  co-enzyme Q-10 50 MG capsule Take 50 mg by mouth daily.    [provider]  cyproheptadine (PERIACTIN) 4 MG tablet Take by mouth. 01/15/20   [provider]  diltiazem (CARDIZEM CD) 120 MG 24 hr capsule Take 1 tablet by mouth daily.    [provider]  famotidine (PEPCID) 40 MG tablet Take 40 mg by mouth daily.  04/20/14   [provider]  ferrous sulfate 325 (65 FE) MG EC tablet Take 325 mg by mouth daily.  05/28/18   [provider]  fluconazole (DIFLUCAN) 200 MG tablet  05/19/19   [provider]  fluticasone (FLONASE) 50  MCG/ACT nasal spray Place 2 sprays into both nostrils daily. 06/21/20   [provider]  gabapentin (NEURONTIN) 300 MG capsule 2 tablet 2 04/27/19   [provider]  imipramine (TOFRANIL) 10 MG tablet Take by mouth. 12/17/19   [provider]  L-Methylfolate-Algae-B12-B6 Glade Stanford) 3-90.314-2-35 MG CAPS Take by mouth. 06/11/17   [provider]  LANTUS SOLOSTAR 100 UNIT/ML Solostar Pen Inject into the skin. 06/05/20   [provider]  liraglutide (VICTOZA) 18 MG/3ML SOPN Inject into the skin. 09/16/16   [provider]  magnesium citrate SOLN Take by mouth.    [provider]  metFORMIN (GLUCOPHAGE-XR) 500 MG 24 hr tablet 1 tablet 01/17/20   [provider]  nitroGLYCERIN (NITROSTAT) 0.4 MG SL tablet TAKE 1 TABLET UNDER THE TONGUE EVERY 5 MINUTES AS NEEDED FOR CHEST PAIN. Up to 3 doses. If no relief call 911 06/22/20   [provider]  ofloxacin (OCUFLOX) 0.3 % ophthalmic solution INSTILL 1 TO 2 DROPS IN LEFT EYE  FOUR TIMES DAILY FOR ACUTE OPIOID THERAPY DAYS 03/07/19   [provider]  Omega-3 Fatty Acids (FISH OIL) 1000 MG CAPS Take by mouth.    [provider]  omeprazole (PRILOSEC) 40 MG capsule Take 40 mg by mouth daily. 02/07/20   [provider]  ondansetron (ZOFRAN-ODT) 4 MG disintegrating tablet Take 4 mg by mouth every 8 (eight) hours as needed. 02/07/20   [provider]  oxybutynin (DITROPAN) 5 MG tablet Take by mouth. 03/06/20   [provider]  OZEMPIC, 0.25 OR 0.5 MG/DOSE, 2 MG/1.5ML SOPN Inject into the skin. 01/17/20   [provider]  polyethylene glycol powder (MIRALAX) 17 GM/SCOOP powder 1 capful    [provider]  predniSONE (DELTASONE) 50 MG tablet Take by mouth. 11/17/19   [provider]  primidone (MYSOLINE) 50 MG tablet Take by mouth. 01/24/20   [provider]  Rotigotine (NEUPRO) 8 MG/24HR PT24 1 patch to skin    [provider]  Semaglutide,0.25 or 0.5MG /DOS, (OZEMPIC, 0.25 OR 0.5 MG/DOSE,) 2 MG/1.5ML SOPN Inject into the skin. 01/17/20   [provider]  Semaglutide,0.25 or 0.5MG /DOS, (OZEMPIC, 0.25 OR 0.5 MG/DOSE,) 2 MG/1.5ML SOPN Titrate 0.25 mg once weekly for 4 weeks then 0.5 mg once weekly for 2 weeks    [provider]  sulfamethoxazole-trimethoprim (BACTRIM DS) 800-160 MG tablet Take 1 tablet by mouth 2 (two) times daily. 03/06/20   [provider]  tadalafil (CIALIS) 5 MG tablet Take 1 tablet by mouth daily. 04/13/20   [provider]  valsartan (DIOVAN) 320 MG tablet 1 tablet    [provider]    Allergies    Oxycodone, Iodinated diagnostic agents, Iodine, Linezolid, Methadone hcl, Promethazine, Morphine sulfate, Other, Oxycodone hcl, Penicillins, Quetiapine, Statins, Zolpidem, Atorvastatin, Carbidopa-levodopa, Clindamycin, Colesevelam, Doxazosin, Lovastatin, and Rosuvastatin  Review of Systems   Review of Systems  Constitutional: Positive for diaphoresis.  HENT: Positive for facial swelling.   Eyes: Negative for redness.  Respiratory: Negative for wheezing.   Cardiovascular: Negative for chest pain.  Gastrointestinal: Positive for nausea. Negative for abdominal pain.  Genitourinary: Negative for dysuria.  Musculoskeletal: Negative for neck stiffness.  Skin: Negative for rash.  Neurological: Negative for facial asymmetry and headaches.  Psychiatric/Behavioral: The patient is nervous/anxious.     Physical Exam Updated Vital Signs BP 140/71   Pulse (!) 126   Temp 98.4 F (36.9 C) (Oral)   Resp (!) 26   Ht 5\' 4"  (1.626 m)   Wt 80.7 kg   SpO2 96%   BMI 30.55 kg/m   Physical Exam Vitals and nursing note reviewed.  Constitutional:      Appearance: Normal appearance. He is not diaphoretic.  HENT:     Head: Normocephalic and atraumatic.     Nose: Nose normal.  Eyes:     Extraocular Movements: Extraocular movements intact.      Conjunctiva/sclera: Conjunctivae normal.     Pupils: Pupils are equal, round, and reactive to light.  Cardiovascular:     Rate and Rhythm: Regular rhythm. Tachycardia present.     Pulses: Normal pulses.     Heart sounds: Normal heart sounds.  Pulmonary:     Effort: Pulmonary effort is normal.     Breath sounds: Normal breath sounds.  Abdominal:     General: Abdomen is flat. Bowel sounds are normal.     Palpations: Abdomen is soft.     Tenderness: There is no abdominal tenderness. There is no guarding.  Musculoskeletal:  General: Normal range of motion.     Cervical back: Normal range of motion and neck supple.  Skin:    General: Skin is warm and dry.     Capillary Refill: Capillary refill takes less than 2 seconds.  Neurological:     General: No focal deficit present.     Mental Status: He is alert and oriented to person, place, and time.     Deep Tendon Reflexes: Reflexes normal.  Psychiatric:        Mood and Affect: Mood is anxious.     ED Results / Procedures / Treatments   Labs (all labs ordered are listed, but only abnormal results are displayed) Results for orders placed or performed during the hospital encounter of 09/08/20  CBC with Differential/Platelet  Result Value Ref Range   WBC 15.7 (H) 4.0 - 10.5 K/uL   RBC 6.71 (H) 4.22 - 5.81 MIL/uL   Hemoglobin 19.0 (H) 13.0 - 17.0 g/dL   HCT 58.5 (H) 39.0 - 52.0 %   MCV 87.2 80.0 - 100.0 fL   MCH 28.3 26.0 - 34.0 pg   MCHC 32.5 30.0 - 36.0 g/dL   RDW 14.6 11.5 - 15.5 %   Platelets 200 150 - 400 K/uL   nRBC 0.0 0.0 - 0.2 %   Neutrophils Relative % 89 %   Neutro Abs 14.0 (H) 1.7 - 7.7 K/uL   Lymphocytes Relative 5 %   Lymphs Abs 0.7 0.7 - 4.0 K/uL   Monocytes Relative 6 %   Monocytes Absolute 0.9 0.1 - 1.0 K/uL   Eosinophils Relative 0 %   Eosinophils Absolute 0.0 0.0 - 0.5 K/uL   Basophils Relative 0 %   Basophils Absolute 0.0 0.0 - 0.1 K/uL   Immature Granulocytes 0 %   Abs Immature Granulocytes 0.06  0.00 - 0.07 K/uL  Urinalysis, Routine w reflex microscopic Urine, Clean Catch  Result Value Ref Range   Color, Urine YELLOW YELLOW   APPearance HAZY (A) CLEAR   Specific Gravity, Urine 1.017 1.005 - 1.030   pH 5.0 5.0 - 8.0   Glucose, UA >=500 (A) NEGATIVE mg/dL   Hgb urine dipstick MODERATE (A) NEGATIVE   Bilirubin Urine NEGATIVE NEGATIVE   Ketones, ur 80 (A) NEGATIVE mg/dL   Protein, ur >=300 (A) NEGATIVE mg/dL   Nitrite NEGATIVE NEGATIVE   Leukocytes,Ua TRACE (A) NEGATIVE   RBC / HPF 21-50 0 - 5 RBC/hpf   WBC, UA 21-50 0 - 5 WBC/hpf   Bacteria, UA RARE (A) NONE SEEN   Squamous Epithelial / LPF 0-5 0 - 5   Mucus PRESENT   Hepatic function panel  Result Value Ref Range   Total Protein 7.6 6.5 - 8.1 g/dL   Albumin 4.3 3.5 - 5.0 g/dL   AST 27 15 - 41 U/L   ALT 32 0 - 44 U/L   Alkaline Phosphatase 65 38 - 126 U/L   Total Bilirubin 2.7 (H) 0.3 - 1.2 mg/dL   Bilirubin, Direct 0.5 (H) 0.0 - 0.2 mg/dL   Indirect Bilirubin 2.2 (H) 0.3 - 0.9 mg/dL  I-stat chem 8, ED (not at [facility] or [facility])  Result Value Ref Range   Sodium 134 (L) 135 - 145 mmol/L   Potassium 4.3 3.5 - 5.1 mmol/L   Chloride 98 98 - 111 mmol/L   BUN 39 (H) 8 - 23 mg/dL   Creatinine, Ser 1.30 (H) 0.61 - 1.24 mg/dL   Glucose, Bld 308 (H) 70 - 99 mg/dL  Calcium, Ion 1.03 (L) 1.15 - 1.40 mmol/L   TCO2 20 (L) 22 - 32 mmol/L   Hemoglobin 20.1 (H) 13.0 - 17.0 g/dL   HCT 59.0 (H) 39.0 - 52.0 %  Troponin I (High Sensitivity)  Result Value Ref Range   Troponin I (High Sensitivity) 28 (H) <18 ng/L   DG Chest Portable 1 View  Result Date: 09/08/2020 CLINICAL DATA:  Anxiety with nausea and cough. EXAM: PORTABLE CHEST 1 VIEW COMPARISON:  April 26, 2019 FINDINGS: Low lung volumes are seen which is likely secondary to the degree of patient inspiration. Mild, stable linear scarring and/or atelectasis is seen within the right lung base. There is no evidence of a pleural effusion or pneumothorax. The heart size and mediastinal  contours are within normal limits. Mild to moderate severity calcification of the thoracic aorta is seen. Degenerative changes seen involving the left shoulder. IMPRESSION: Mild, stable right basilar linear scarring and/or atelectasis. Electronically Signed   By: Virgina Norfolk M.D.   On: 09/08/2020 23:46    EKG EKG Interpretation  Date/Time:  Friday September 08 2020 23:41:22 EDT Ventricular Rate:  125 PR Interval:  151 QRS Duration: 77 QT Interval:  312 QTC Calculation: 450 R Axis:   67 Text Interpretation: Sinus tachycardia Multiple premature complexes, vent & supraven Probable left ventricular hypertrophy Confirmed by Dory Horn) on 09/08/2020 11:44:06 PM   Radiology DG Chest Portable 1 View  Result Date: 09/08/2020 CLINICAL DATA:  Anxiety with nausea and cough. EXAM: PORTABLE CHEST 1 VIEW COMPARISON:  April 26, 2019 FINDINGS: Low lung volumes are seen which is likely secondary to the degree of patient inspiration. Mild, stable linear scarring and/or atelectasis is seen within the right lung base. There is no evidence of a pleural effusion or pneumothorax. The heart size and mediastinal contours are within normal limits. Mild to moderate severity calcification of the thoracic aorta is seen. Degenerative changes seen involving the left shoulder. IMPRESSION: Mild, stable right basilar linear scarring and/or atelectasis. Electronically Signed   By: Virgina Norfolk M.D.   On: 09/08/2020 23:46    Procedures Procedures   Medications Ordered in ED Medications  sodium chloride 0.9 % bolus 500 mL (has no administration in time range)  aztreonam (AZACTAM) 1 g in sodium chloride 0.9 % 100 mL IVPB (has no administration in time range)  0.9 %  sodium chloride infusion ( Intravenous New Bag/Given 09/09/20 0011)    ED Course  I have reviewed the triage vital signs and the nursing notes.  Pertinent labs & imaging results that were available during my care of the patient were reviewed  by me and considered in my medical decision making (see chart for details).    This could all be a medication effect, but with the sweating and elevated troponin we must consider cardiac etiology.  Moreover the patient is clearly dehydrated based on urine and hemoconcetration.  Will treat with IVF.  Will need a rule out and echo.    Final Clinical Impression(s) / ED Diagnoses Final diagnoses:  Urinary tract infection without hematuria, site unspecified  Dehydration  Anxiety  Elevated troponin I level  Diaphoresis   Admit to medicine    Golda Zavalza, MD 09/09/20 0111

## 2020-09-09 NOTE — Progress Notes (Signed)
Patient refuses CPAP for tonight.  

## 2020-09-09 NOTE — ED Notes (Signed)
Patient helped to bedside commode. New chux placed on bed.

## 2020-09-09 NOTE — Progress Notes (Signed)
  Echocardiogram 2D Echocardiogram has been performed.  Merrie Roof F 09/09/2020, 11:46 AM

## 2020-09-09 NOTE — ED Notes (Signed)
Vascular called and reports that patient is negative for DVT

## 2020-09-09 NOTE — ED Notes (Signed)
PT REQUEST WATER BEFORE BLOOD WORK. YELLED AT WRITER TO GIVE HIM WATER. REFUSE LABS

## 2020-09-10 ENCOUNTER — Other Ambulatory Visit: Payer: Self-pay

## 2020-09-10 ENCOUNTER — Observation Stay (HOSPITAL_COMMUNITY): Payer: Medicare Other

## 2020-09-10 ENCOUNTER — Inpatient Hospital Stay (HOSPITAL_COMMUNITY): Payer: Medicare Other

## 2020-09-10 ENCOUNTER — Observation Stay: Payer: Self-pay

## 2020-09-10 DIAGNOSIS — I10 Essential (primary) hypertension: Secondary | ICD-10-CM | POA: Diagnosis not present

## 2020-09-10 DIAGNOSIS — E46 Unspecified protein-calorie malnutrition: Secondary | ICD-10-CM | POA: Diagnosis not present

## 2020-09-10 DIAGNOSIS — Z23 Encounter for immunization: Secondary | ICD-10-CM | POA: Diagnosis not present

## 2020-09-10 DIAGNOSIS — N1831 Chronic kidney disease, stage 3a: Secondary | ICD-10-CM | POA: Diagnosis not present

## 2020-09-10 DIAGNOSIS — G20 Parkinson's disease: Secondary | ICD-10-CM

## 2020-09-10 DIAGNOSIS — R0602 Shortness of breath: Secondary | ICD-10-CM | POA: Diagnosis not present

## 2020-09-10 DIAGNOSIS — Z6832 Body mass index (BMI) 32.0-32.9, adult: Secondary | ICD-10-CM | POA: Diagnosis not present

## 2020-09-10 DIAGNOSIS — Z20822 Contact with and (suspected) exposure to covid-19: Secondary | ICD-10-CM | POA: Diagnosis not present

## 2020-09-10 DIAGNOSIS — K5901 Slow transit constipation: Secondary | ICD-10-CM | POA: Diagnosis not present

## 2020-09-10 DIAGNOSIS — E11649 Type 2 diabetes mellitus with hypoglycemia without coma: Secondary | ICD-10-CM | POA: Diagnosis not present

## 2020-09-10 DIAGNOSIS — G2581 Restless legs syndrome: Secondary | ICD-10-CM | POA: Diagnosis not present

## 2020-09-10 DIAGNOSIS — Z9189 Other specified personal risk factors, not elsewhere classified: Secondary | ICD-10-CM | POA: Diagnosis not present

## 2020-09-10 DIAGNOSIS — R778 Other specified abnormalities of plasma proteins: Secondary | ICD-10-CM | POA: Diagnosis not present

## 2020-09-10 DIAGNOSIS — N183 Chronic kidney disease, stage 3 unspecified: Secondary | ICD-10-CM | POA: Diagnosis not present

## 2020-09-10 DIAGNOSIS — E860 Dehydration: Secondary | ICD-10-CM | POA: Diagnosis not present

## 2020-09-10 DIAGNOSIS — G9341 Metabolic encephalopathy: Secondary | ICD-10-CM | POA: Diagnosis present

## 2020-09-10 DIAGNOSIS — R110 Nausea: Secondary | ICD-10-CM | POA: Diagnosis present

## 2020-09-10 DIAGNOSIS — F419 Anxiety disorder, unspecified: Secondary | ICD-10-CM | POA: Diagnosis not present

## 2020-09-10 DIAGNOSIS — E669 Obesity, unspecified: Secondary | ICD-10-CM | POA: Diagnosis present

## 2020-09-10 DIAGNOSIS — F0390 Unspecified dementia without behavioral disturbance: Secondary | ICD-10-CM | POA: Diagnosis present

## 2020-09-10 DIAGNOSIS — M4312 Spondylolisthesis, cervical region: Secondary | ICD-10-CM | POA: Diagnosis not present

## 2020-09-10 DIAGNOSIS — J9811 Atelectasis: Secondary | ICD-10-CM | POA: Diagnosis not present

## 2020-09-10 DIAGNOSIS — G219 Secondary parkinsonism, unspecified: Secondary | ICD-10-CM | POA: Diagnosis not present

## 2020-09-10 DIAGNOSIS — Z794 Long term (current) use of insulin: Secondary | ICD-10-CM | POA: Diagnosis not present

## 2020-09-10 DIAGNOSIS — R7881 Bacteremia: Secondary | ICD-10-CM | POA: Diagnosis not present

## 2020-09-10 DIAGNOSIS — Z8669 Personal history of other diseases of the nervous system and sense organs: Secondary | ICD-10-CM | POA: Diagnosis not present

## 2020-09-10 DIAGNOSIS — G8929 Other chronic pain: Secondary | ICD-10-CM | POA: Diagnosis not present

## 2020-09-10 DIAGNOSIS — E1121 Type 2 diabetes mellitus with diabetic nephropathy: Secondary | ICD-10-CM | POA: Diagnosis not present

## 2020-09-10 DIAGNOSIS — R41841 Cognitive communication deficit: Secondary | ICD-10-CM | POA: Diagnosis not present

## 2020-09-10 DIAGNOSIS — R5381 Other malaise: Secondary | ICD-10-CM | POA: Diagnosis not present

## 2020-09-10 DIAGNOSIS — S9032XA Contusion of left foot, initial encounter: Secondary | ICD-10-CM | POA: Diagnosis not present

## 2020-09-10 DIAGNOSIS — N200 Calculus of kidney: Secondary | ICD-10-CM | POA: Diagnosis not present

## 2020-09-10 DIAGNOSIS — I480 Paroxysmal atrial fibrillation: Secondary | ICD-10-CM | POA: Diagnosis not present

## 2020-09-10 DIAGNOSIS — Z7401 Bed confinement status: Secondary | ICD-10-CM | POA: Diagnosis not present

## 2020-09-10 DIAGNOSIS — R1312 Dysphagia, oropharyngeal phase: Secondary | ICD-10-CM | POA: Diagnosis not present

## 2020-09-10 DIAGNOSIS — G9340 Encephalopathy, unspecified: Secondary | ICD-10-CM | POA: Diagnosis not present

## 2020-09-10 DIAGNOSIS — R4182 Altered mental status, unspecified: Secondary | ICD-10-CM | POA: Diagnosis not present

## 2020-09-10 DIAGNOSIS — I4892 Unspecified atrial flutter: Secondary | ICD-10-CM | POA: Diagnosis not present

## 2020-09-10 DIAGNOSIS — N136 Pyonephrosis: Secondary | ICD-10-CM | POA: Diagnosis present

## 2020-09-10 DIAGNOSIS — N320 Bladder-neck obstruction: Secondary | ICD-10-CM | POA: Diagnosis not present

## 2020-09-10 DIAGNOSIS — N281 Cyst of kidney, acquired: Secondary | ICD-10-CM | POA: Diagnosis not present

## 2020-09-10 DIAGNOSIS — I248 Other forms of acute ischemic heart disease: Secondary | ICD-10-CM

## 2020-09-10 DIAGNOSIS — N390 Urinary tract infection, site not specified: Secondary | ICD-10-CM | POA: Diagnosis not present

## 2020-09-10 DIAGNOSIS — S90122A Contusion of left lesser toe(s) without damage to nail, initial encounter: Secondary | ICD-10-CM | POA: Diagnosis not present

## 2020-09-10 DIAGNOSIS — N179 Acute kidney failure, unspecified: Secondary | ICD-10-CM | POA: Diagnosis not present

## 2020-09-10 DIAGNOSIS — M4802 Spinal stenosis, cervical region: Secondary | ICD-10-CM | POA: Diagnosis not present

## 2020-09-10 DIAGNOSIS — M47812 Spondylosis without myelopathy or radiculopathy, cervical region: Secondary | ICD-10-CM | POA: Diagnosis not present

## 2020-09-10 DIAGNOSIS — Y838 Other surgical procedures as the cause of abnormal reaction of the patient, or of later complication, without mention of misadventure at the time of the procedure: Secondary | ICD-10-CM | POA: Diagnosis present

## 2020-09-10 DIAGNOSIS — R1310 Dysphagia, unspecified: Secondary | ICD-10-CM | POA: Diagnosis not present

## 2020-09-10 DIAGNOSIS — E870 Hyperosmolality and hypernatremia: Secondary | ICD-10-CM | POA: Diagnosis not present

## 2020-09-10 DIAGNOSIS — R6510 Systemic inflammatory response syndrome (SIRS) of non-infectious origin without acute organ dysfunction: Secondary | ICD-10-CM | POA: Diagnosis not present

## 2020-09-10 DIAGNOSIS — N1330 Unspecified hydronephrosis: Secondary | ICD-10-CM | POA: Diagnosis not present

## 2020-09-10 DIAGNOSIS — R0682 Tachypnea, not elsewhere classified: Secondary | ICD-10-CM | POA: Diagnosis not present

## 2020-09-10 DIAGNOSIS — E1122 Type 2 diabetes mellitus with diabetic chronic kidney disease: Secondary | ICD-10-CM | POA: Diagnosis not present

## 2020-09-10 DIAGNOSIS — K219 Gastro-esophageal reflux disease without esophagitis: Secondary | ICD-10-CM | POA: Diagnosis not present

## 2020-09-10 DIAGNOSIS — R338 Other retention of urine: Secondary | ICD-10-CM | POA: Diagnosis not present

## 2020-09-10 DIAGNOSIS — D751 Secondary polycythemia: Secondary | ICD-10-CM | POA: Diagnosis not present

## 2020-09-10 DIAGNOSIS — F1123 Opioid dependence with withdrawal: Secondary | ICD-10-CM | POA: Diagnosis present

## 2020-09-10 DIAGNOSIS — N401 Enlarged prostate with lower urinary tract symptoms: Secondary | ICD-10-CM | POA: Diagnosis not present

## 2020-09-10 DIAGNOSIS — N3289 Other specified disorders of bladder: Secondary | ICD-10-CM | POA: Diagnosis not present

## 2020-09-10 DIAGNOSIS — G4733 Obstructive sleep apnea (adult) (pediatric): Secondary | ICD-10-CM | POA: Diagnosis not present

## 2020-09-10 DIAGNOSIS — I129 Hypertensive chronic kidney disease with stage 1 through stage 4 chronic kidney disease, or unspecified chronic kidney disease: Secondary | ICD-10-CM | POA: Diagnosis not present

## 2020-09-10 DIAGNOSIS — G319 Degenerative disease of nervous system, unspecified: Secondary | ICD-10-CM | POA: Diagnosis not present

## 2020-09-10 DIAGNOSIS — E119 Type 2 diabetes mellitus without complications: Secondary | ICD-10-CM | POA: Diagnosis not present

## 2020-09-10 DIAGNOSIS — D696 Thrombocytopenia, unspecified: Secondary | ICD-10-CM | POA: Diagnosis not present

## 2020-09-10 DIAGNOSIS — R339 Retention of urine, unspecified: Secondary | ICD-10-CM | POA: Diagnosis not present

## 2020-09-10 DIAGNOSIS — M6281 Muscle weakness (generalized): Secondary | ICD-10-CM | POA: Diagnosis not present

## 2020-09-10 DIAGNOSIS — R0902 Hypoxemia: Secondary | ICD-10-CM | POA: Diagnosis not present

## 2020-09-10 DIAGNOSIS — A419 Sepsis, unspecified organism: Secondary | ICD-10-CM | POA: Diagnosis not present

## 2020-09-10 DIAGNOSIS — N35919 Unspecified urethral stricture, male, unspecified site: Secondary | ICD-10-CM | POA: Diagnosis not present

## 2020-09-10 DIAGNOSIS — I119 Hypertensive heart disease without heart failure: Secondary | ICD-10-CM | POA: Diagnosis not present

## 2020-09-10 DIAGNOSIS — E785 Hyperlipidemia, unspecified: Secondary | ICD-10-CM | POA: Diagnosis not present

## 2020-09-10 DIAGNOSIS — E1142 Type 2 diabetes mellitus with diabetic polyneuropathy: Secondary | ICD-10-CM | POA: Diagnosis not present

## 2020-09-10 DIAGNOSIS — I517 Cardiomegaly: Secondary | ICD-10-CM | POA: Diagnosis not present

## 2020-09-10 DIAGNOSIS — E1140 Type 2 diabetes mellitus with diabetic neuropathy, unspecified: Secondary | ICD-10-CM | POA: Diagnosis present

## 2020-09-10 LAB — VITAMIN B12: Vitamin B-12: 718 pg/mL (ref 180–914)

## 2020-09-10 LAB — URINE CULTURE: Culture: <10000 — AB

## 2020-09-10 LAB — COMPREHENSIVE METABOLIC PANEL
ALT: 37 U/L (ref 0–44)
AST: 64 U/L — ABNORMAL HIGH (ref 15–41)
Albumin: 3.4 g/dL — ABNORMAL LOW (ref 3.5–5.0)
Alkaline Phosphatase: 46 U/L (ref 38–126)
Anion gap: 11 (ref 5–15)
BUN: 41 mg/dL — ABNORMAL HIGH (ref 8–23)
CO2: 24 mmol/L (ref 22–32)
Calcium: 9.3 mg/dL (ref 8.9–10.3)
Chloride: 108 mmol/L (ref 98–111)
Creatinine, Ser: 2.2 mg/dL — ABNORMAL HIGH (ref 0.61–1.24)
GFR, Estimated: 29 mL/min — ABNORMAL LOW (ref >60)
Glucose, Bld: 74 mg/dL (ref 70–99)
Potassium: 4.2 mmol/L (ref 3.5–5.1)
Sodium: 143 mmol/L (ref 135–145)
Total Bilirubin: 1.8 mg/dL — ABNORMAL HIGH (ref 0.3–1.2)
Total Protein: 5.8 g/dL — ABNORMAL LOW (ref 6.5–8.1)

## 2020-09-10 LAB — BLOOD GAS, ARTERIAL
Acid-Base Excess: 0.3 mmol/L (ref 0.0–2.0)
Bicarbonate: 21.6 mmol/L (ref 20.0–28.0)
Drawn by: 11249
FIO2: 44
O2 Content: 6 L/min
O2 Saturation: 97.8 %
Patient temperature: 97.9
pCO2 arterial: 28.3 mmHg — ABNORMAL LOW (ref 32.0–48.0)
pH, Arterial: 7.492 — ABNORMAL HIGH (ref 7.350–7.450)
pO2, Arterial: 94.1 mmHg (ref 83.0–108.0)

## 2020-09-10 LAB — BASIC METABOLIC PANEL
Anion gap: 10 (ref 5–15)
BUN: 48 mg/dL — ABNORMAL HIGH (ref 8–23)
CO2: 23 mmol/L (ref 22–32)
Calcium: 8.9 mg/dL (ref 8.9–10.3)
Chloride: 108 mmol/L (ref 98–111)
Creatinine, Ser: 1.98 mg/dL — ABNORMAL HIGH (ref 0.61–1.24)
GFR, Estimated: 33 mL/min — ABNORMAL LOW (ref >60)
Glucose, Bld: 72 mg/dL (ref 70–99)
Potassium: 3.6 mmol/L (ref 3.5–5.1)
Sodium: 141 mmol/L (ref 135–145)

## 2020-09-10 LAB — PROCALCITONIN: Procalcitonin: 0.3 ng/mL

## 2020-09-10 LAB — BRAIN NATRIURETIC PEPTIDE: B Natriuretic Peptide: 95.6 pg/mL (ref 0.0–100.0)

## 2020-09-10 LAB — GLUCOSE, CAPILLARY
Glucose-Capillary: 56 mg/dL — ABNORMAL LOW (ref 70–99)
Glucose-Capillary: 97 mg/dL (ref 70–99)
Glucose-Capillary: 76 mg/dL (ref 70–99)
Glucose-Capillary: 59 mg/dL — ABNORMAL LOW (ref 70–99)
Glucose-Capillary: 99 mg/dL (ref 70–99)
Glucose-Capillary: 58 mg/dL — ABNORMAL LOW (ref 70–99)
Glucose-Capillary: 49 mg/dL — ABNORMAL LOW (ref 70–99)
Glucose-Capillary: 261 mg/dL — ABNORMAL HIGH (ref 70–99)
Glucose-Capillary: 120 mg/dL — ABNORMAL HIGH (ref 70–99)
Glucose-Capillary: 78 mg/dL (ref 70–99)
Glucose-Capillary: 77 mg/dL (ref 70–99)
Glucose-Capillary: 74 mg/dL (ref 70–99)
Glucose-Capillary: 97 mg/dL (ref 70–99)
Glucose-Capillary: 72 mg/dL (ref 70–99)
Glucose-Capillary: 75 mg/dL (ref 70–99)
Glucose-Capillary: 85 mg/dL (ref 70–99)

## 2020-09-10 LAB — MRSA PCR SCREENING: MRSA by PCR: POSITIVE — AB

## 2020-09-10 LAB — CBC
HCT: 54.7 % — ABNORMAL HIGH (ref 39.0–52.0)
Hemoglobin: 17.6 g/dL — ABNORMAL HIGH (ref 13.0–17.0)
MCH: 28.6 pg (ref 26.0–34.0)
MCHC: 32.2 g/dL (ref 30.0–36.0)
MCV: 88.8 fL (ref 80.0–100.0)
Platelets: 210 10*3/uL (ref 150–400)
RBC: 6.16 MIL/uL — ABNORMAL HIGH (ref 4.22–5.81)
RDW: 14.3 % (ref 11.5–15.5)
WBC: 20 10*3/uL — ABNORMAL HIGH (ref 4.0–10.5)
nRBC: 0 % (ref 0.0–0.2)

## 2020-09-10 LAB — TROPONIN I (HIGH SENSITIVITY)
Troponin I (High Sensitivity): 388 ng/L (ref <18)
Troponin I (High Sensitivity): 300 ng/L (ref <18)

## 2020-09-10 LAB — SEDIMENTATION RATE: Sed Rate: 0 mm/hr (ref 0–16)

## 2020-09-10 LAB — FOLATE: Folate: 20.7 ng/mL (ref >5.9)

## 2020-09-10 LAB — AMMONIA: Ammonia: 23 umol/L (ref 9–35)

## 2020-09-10 LAB — CORTISOL: Cortisol, Plasma: 35.5 ug/dL

## 2020-09-10 LAB — CK: Total CK: 2578 U/L — ABNORMAL HIGH (ref 49–397)

## 2020-09-10 LAB — T4, FREE: Free T4: 1.3 ng/dL — ABNORMAL HIGH (ref 0.61–1.12)

## 2020-09-10 LAB — APTT: aPTT: 28 seconds (ref 24–36)

## 2020-09-10 LAB — PROTIME-INR
INR: 1.2 (ref 0.8–1.2)
Prothrombin Time: 15 seconds (ref 11.4–15.2)

## 2020-09-10 IMAGING — CT CT HEAD W/O CM
3 of 5 series · 14 of 47 positions shown, 16 images · non-contrast
Comparison: None

CLINICAL DATA: Delirium, increased altered mental status

EXAM:
CT HEAD WITHOUT CONTRAST
TECHNIQUE: Contiguous axial images were obtained from the base of the skull
through the vertex without intravenous contrast. Sagittal and
coronal MPR images reconstructed from axial data set.

[Series 4: coronal soft tissue · coronal · 0.35mm/px · 3 of 69 slices shown]
[im 23/69  brain]
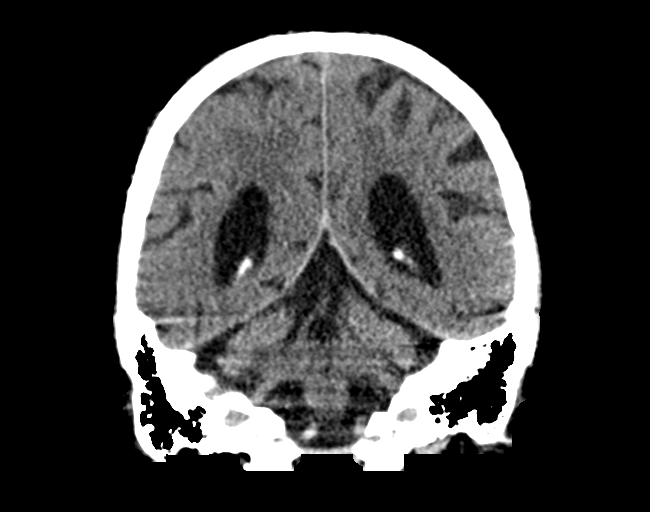
[im 31/69  brain]
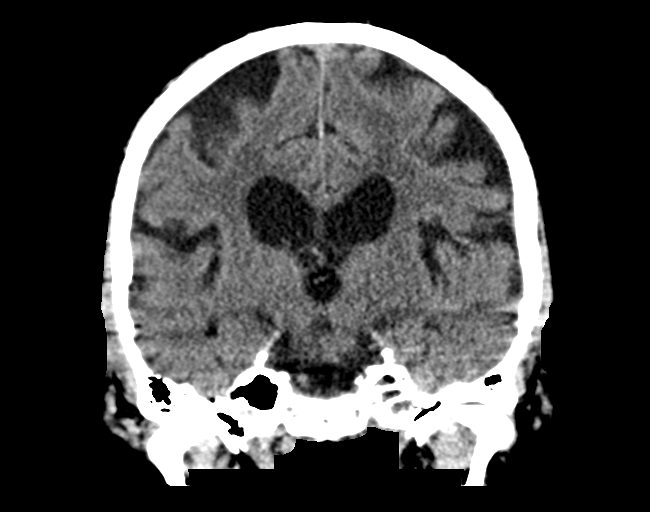
[im 38/69  brain]
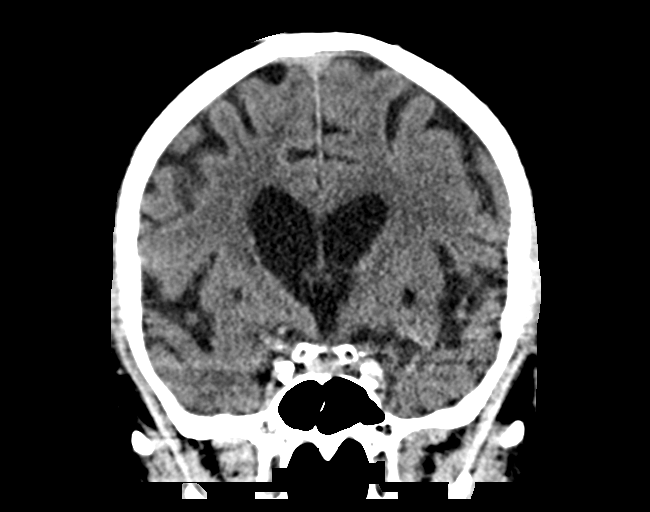

[Series 5: sagittal soft tissue · sagittal · 0.35mm/px · 3 of 74 slices shown]
[im 25/74  brain]
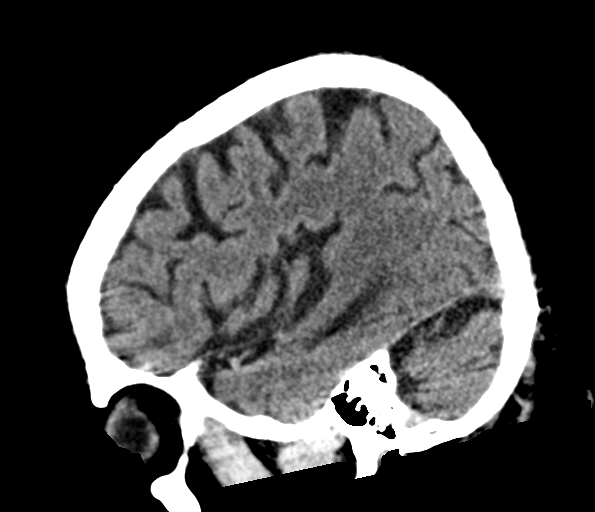
[im 37/74  brain]
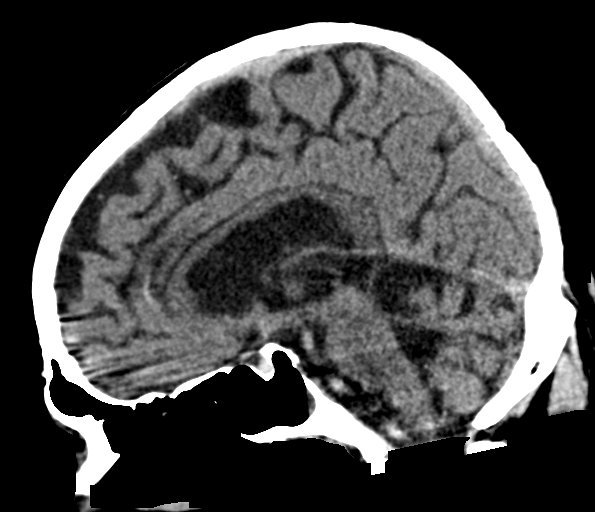
[im 49/74  brain]
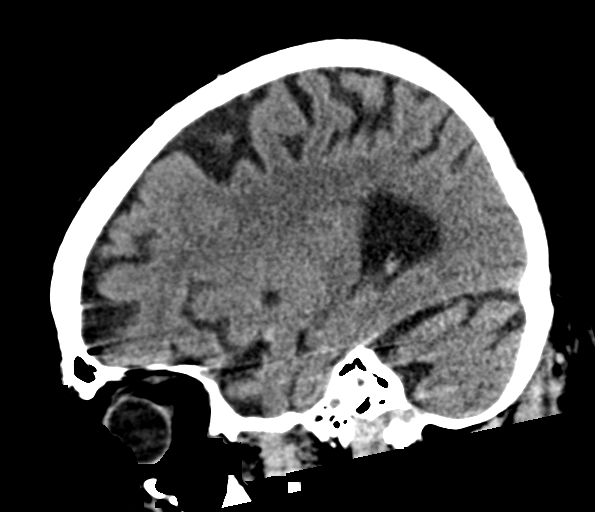

[Series 6: axial soft tissue · axial · 0.40mm/px · z∈[+1521,+1653]mm · 8 of 59 slices shown, 10 images]
[im 7/59  brain]
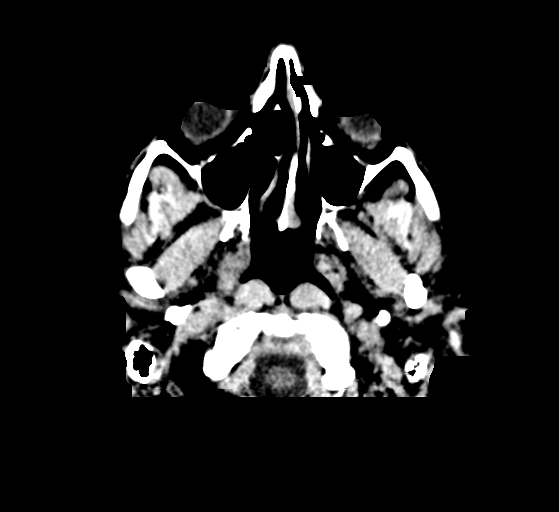
[im 7/59  bone]
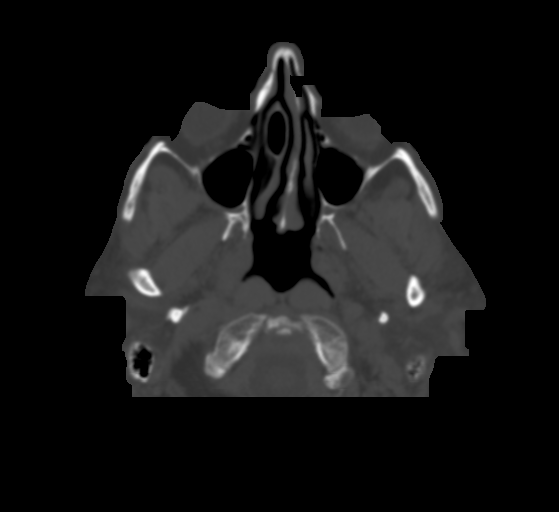
[im 13/59  brain]
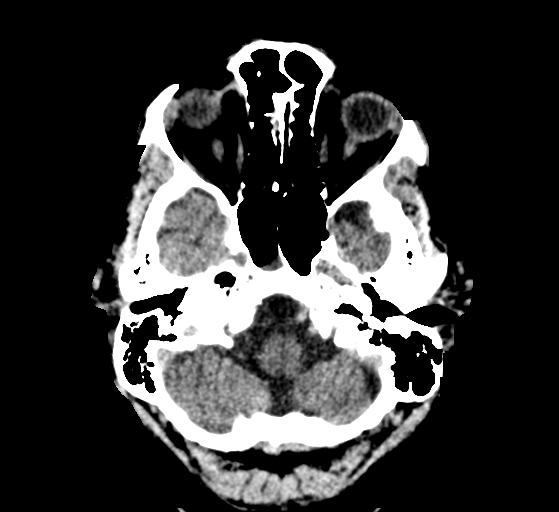
[im 20/59  brain]
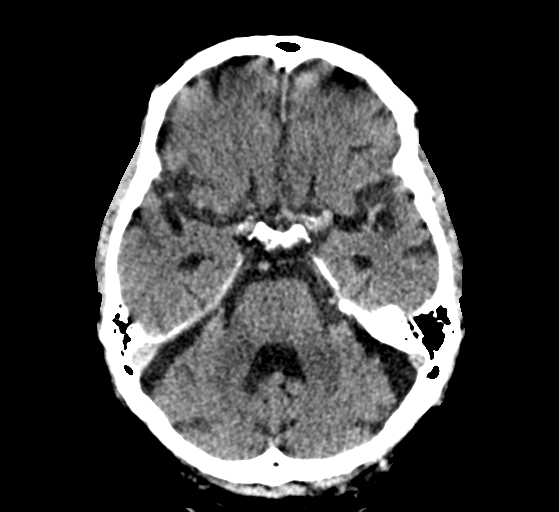
[im 26/59  brain]
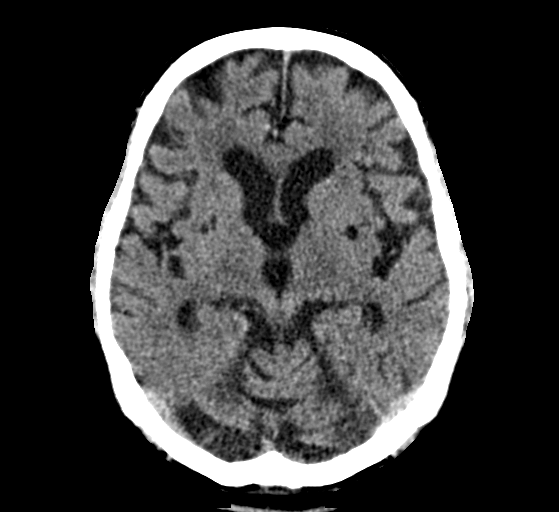
[im 33/59  brain]
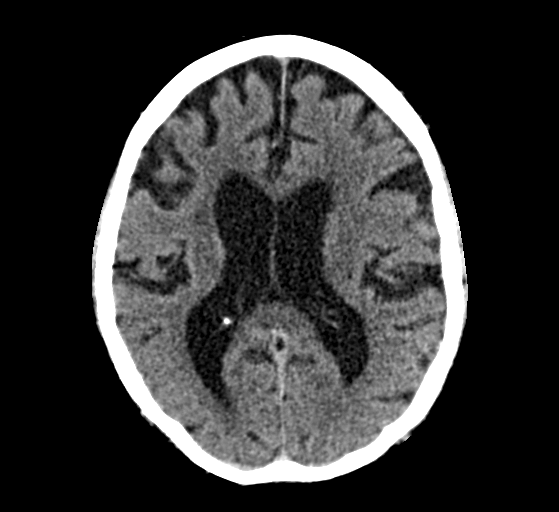
[im 33/59  bone]
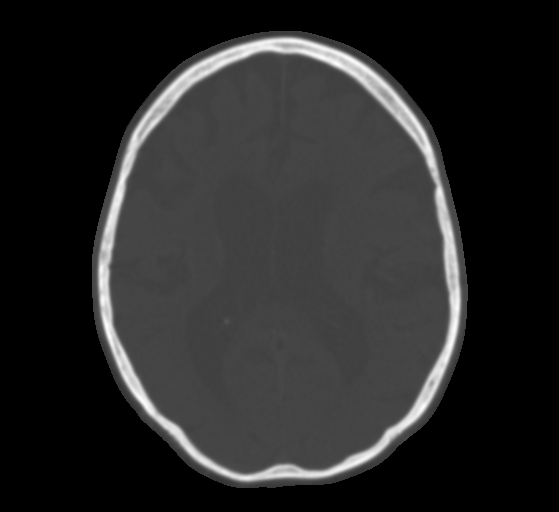
[im 39/59  brain]
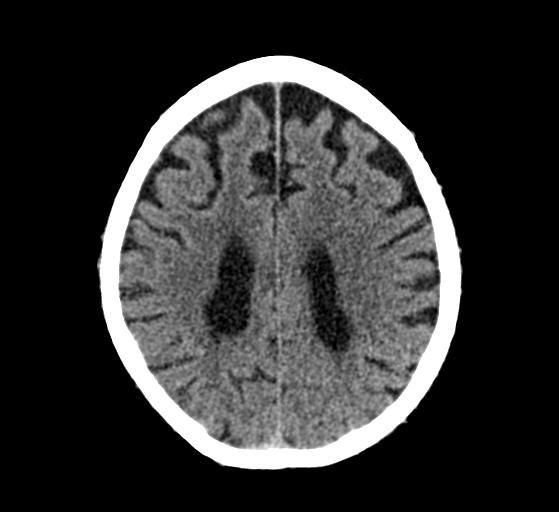
[im 46/59  brain]
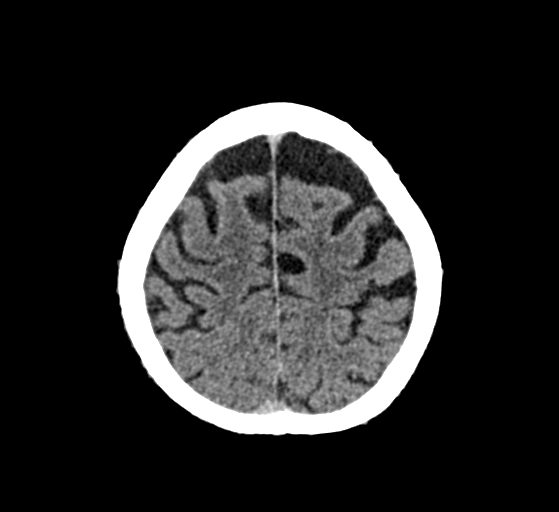
[im 52/59  brain]
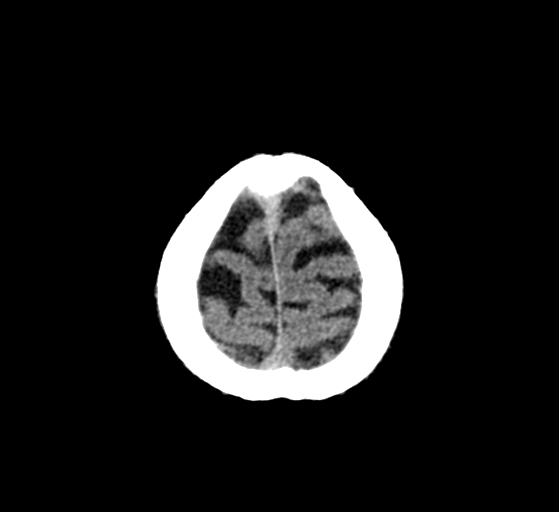

[14 of 47 positions shown; findings below may reference images not displayed]

FINDINGS: Brain: Generalized atrophy. Normal ventricular morphology. No
midline shift or mass effect. Small vessel chronic ischemic changes
of deep cerebral white matter. Old lacunar infarcts at basal ganglia
bilaterally. No intracranial hemorrhage, mass lesion, or evidence of
acute infarction. No extra-axial fluid collections.

Vascular: No hyperdense vessels. Atherosclerotic calcification of
internal carotid and vertebral arteries at skull base noted.

Skull: Intact

Sinuses/Orbits: Small amount of fluid within sphenoid sinus.
Paranasal sinuses and mastoid air cells otherwise clear.

Other: N/A
IMPRESSION: Atrophy with small vessel chronic ischemic changes of deep cerebral
white matter.

Old lacunar infarcts at basal ganglia bilaterally.

No acute intracranial abnormalities.

Small amount of nonspecific fluid within sphenoid spinous.

## 2020-09-10 IMAGING — DX DG CHEST 1V PORT
1 series · 1 of 1 positions shown · non-contrast
Comparison: Portable exam [AW] hours compared to [DATE]

CLINICAL DATA: Increased shortness of breath

EXAM:
PORTABLE CHEST 1 VIEW

[chest ap]
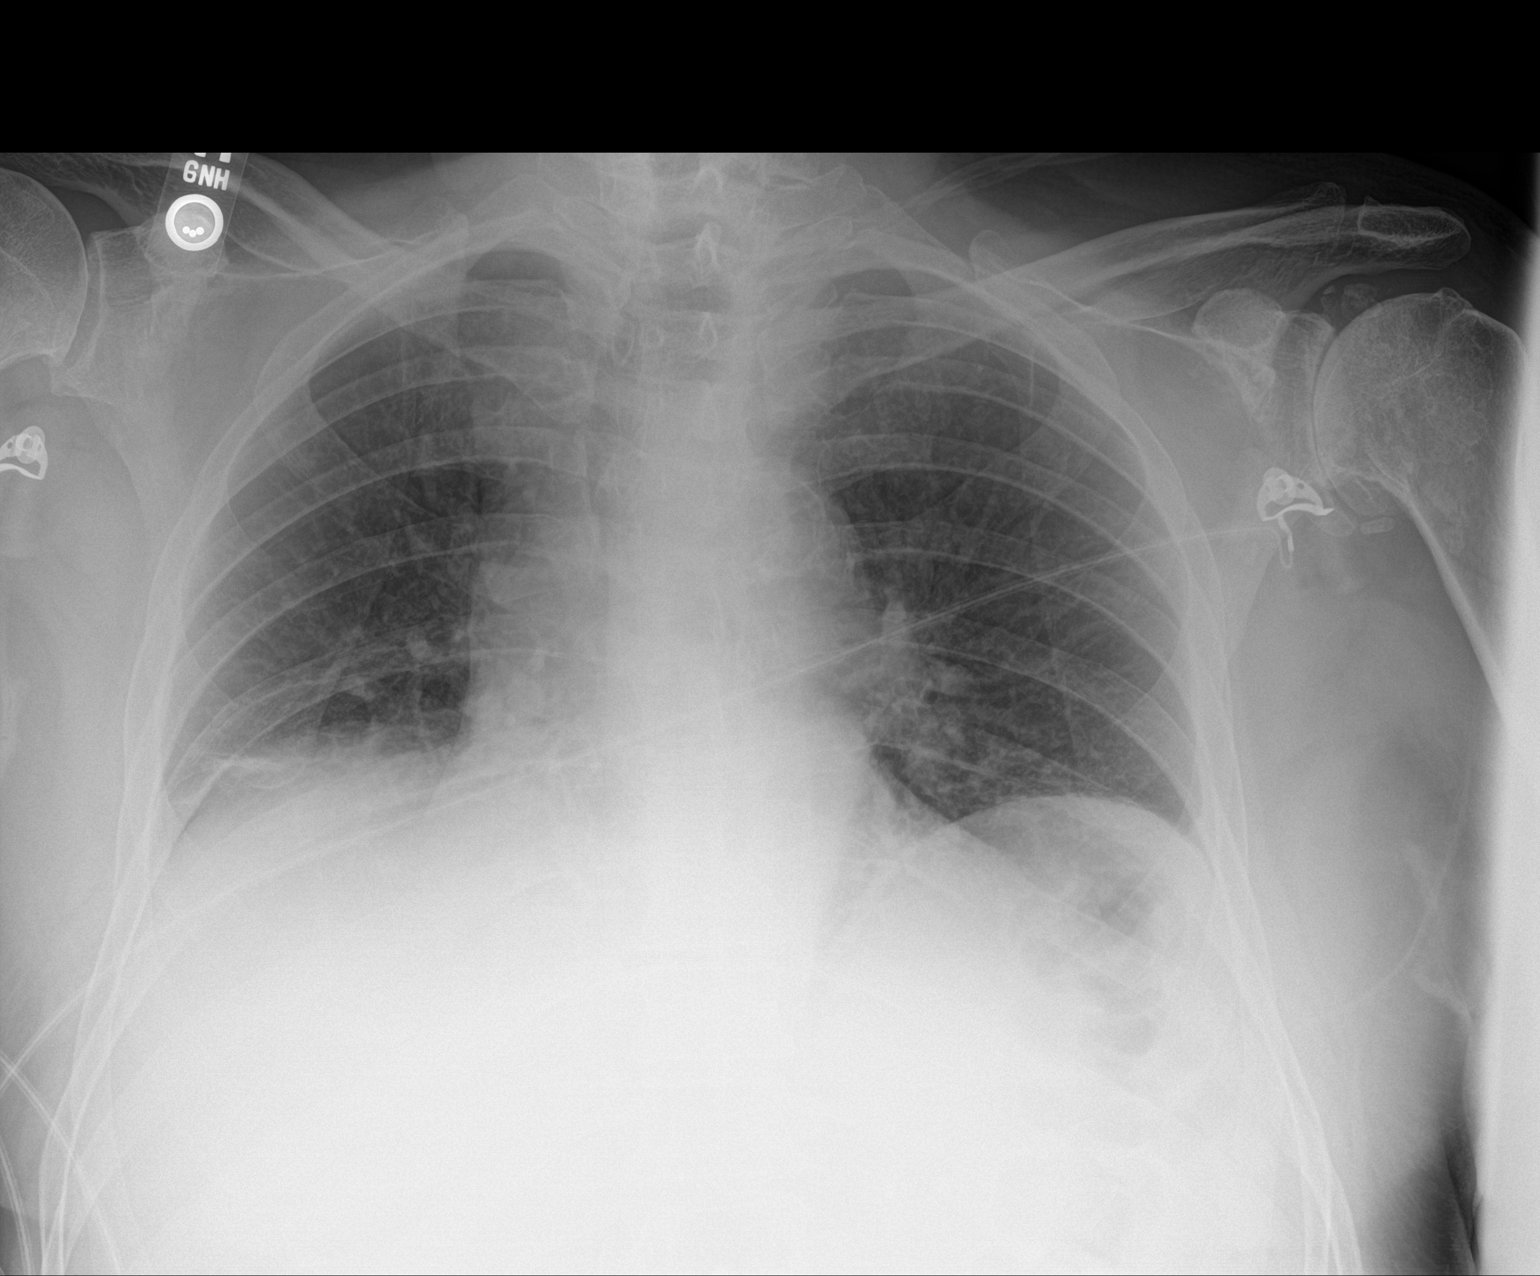

[1 of 1 positions shown; findings below may reference images not displayed]

FINDINGS: Enlargement of cardiac silhouette.

Atherosclerotic calcification aorta.

Mediastinal contours and pulmonary vascularity normal.

Decreased lung volumes with bibasilar atelectasis.

Upper lungs clear.

No pleural effusion or pneumothorax.

Advanced LEFT glenohumeral degenerative changes with calcified loose
bodies.
IMPRESSION: Decreased lung volumes with bibasilar atelectasis.

Enlargement of cardiac silhouette.

Aortic Atherosclerosis ([AW]-[AW]).

## 2020-09-10 IMAGING — DX DG FOOT 2V*L*
2 series · 2 of 2 positions shown · non-contrast
Comparison: None.

CLINICAL DATA: LEFT foot bruising.

EXAM:
LEFT FOOT - 2 VIEW

[foot ap]
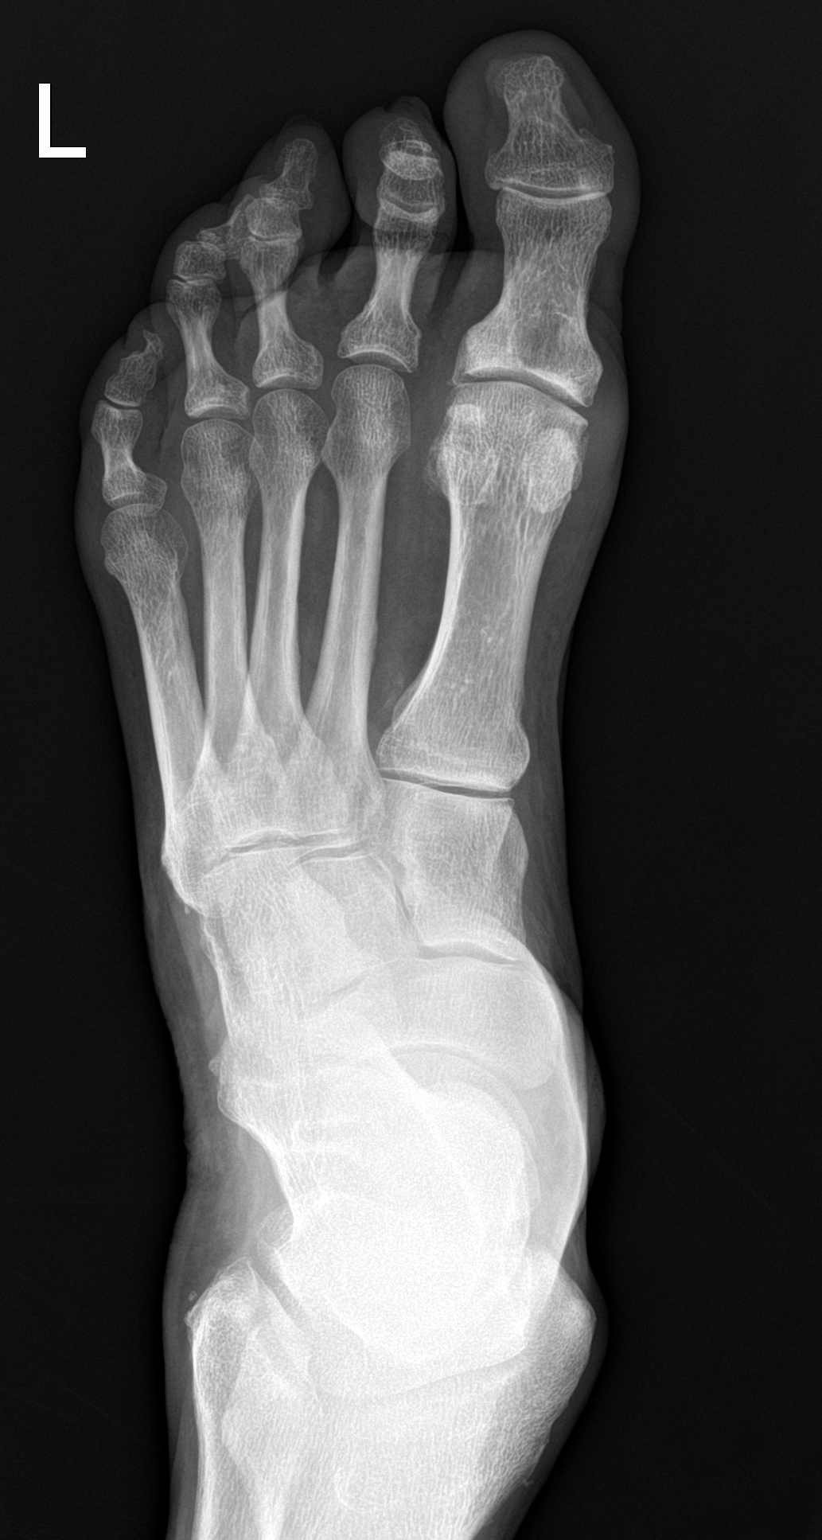

[foot lat]
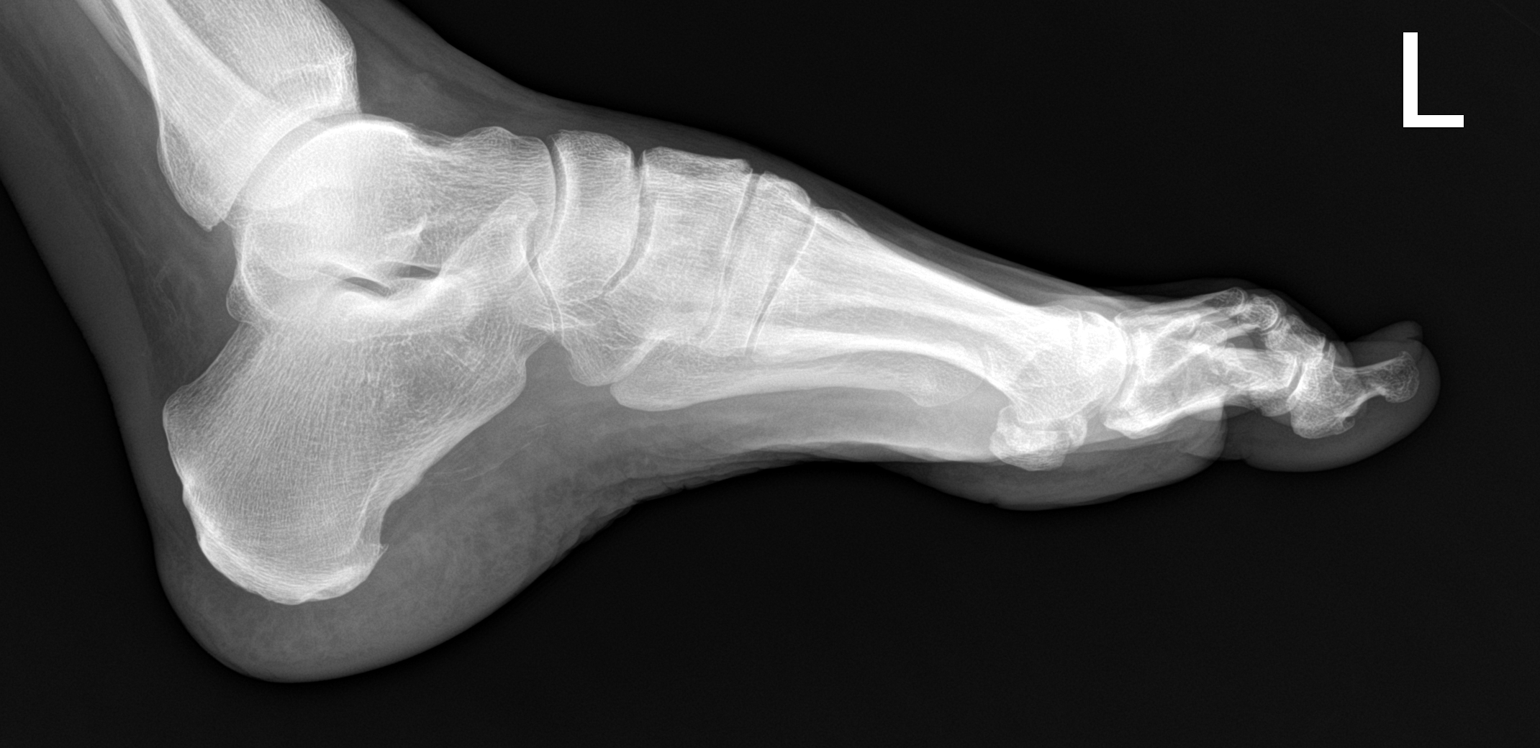

[2 of 2 positions shown; findings below may reference images not displayed]

FINDINGS: Osseous alignment is normal. No fracture line or displaced fracture
fragment is seen. No degenerative change. Adjacent soft tissues are
unremarkable.
IMPRESSION: Negative.

## 2020-09-10 MED ORDER — DEXTROSE 50 % IV SOLN
INTRAVENOUS | Status: AC
  Administered 2020-09-10: 50 mL
  Filled 2020-09-10: qty 50

## 2020-09-10 MED ORDER — HYDROMORPHONE HCL 1 MG/ML IJ SOLN
0.5000 mg | INTRAMUSCULAR | Status: DC
  Administered 2020-09-10 (×2): 0.5 mg via INTRAVENOUS
  Filled 2020-09-10 (×2): qty 1

## 2020-09-10 MED ORDER — PHENTOLAMINE MESYLATE 5 MG IJ SOLR
5.0000 mg | Freq: Once | INTRAMUSCULAR | Status: AC
Start: 2020-09-10 — End: 2020-09-10
  Administered 2020-09-10: 5 mg via SUBCUTANEOUS
  Filled 2020-09-10 (×2): qty 5

## 2020-09-10 MED ORDER — HALOPERIDOL LACTATE 5 MG/ML IJ SOLN
INTRAMUSCULAR | Status: AC
  Filled 2020-09-10: qty 1

## 2020-09-10 MED ORDER — MUPIROCIN 2 % EX OINT
1.0000 "application " | TOPICAL_OINTMENT | Freq: Two times a day (BID) | CUTANEOUS | Status: AC
  Administered 2020-09-10 – 2020-09-14 (×10): 1 via NASAL
  Filled 2020-09-10: qty 22

## 2020-09-10 MED ORDER — NALOXONE HCL 0.4 MG/ML IJ SOLN
0.4000 mg | INTRAMUSCULAR | Status: DC | PRN
  Filled 2020-09-10: qty 1

## 2020-09-10 MED ORDER — LACTATED RINGERS IV BOLUS
1000.0000 mL | Freq: Once | INTRAVENOUS | Status: AC
  Administered 2020-09-10: 1000 mL via INTRAVENOUS

## 2020-09-10 MED ORDER — HYDROMORPHONE HCL 1 MG/ML IJ SOLN
0.5000 mg | INTRAMUSCULAR | Status: DC | PRN
  Administered 2020-09-10 – 2020-09-11 (×2): 0.5 mg via INTRAVENOUS
  Filled 2020-09-10 (×3): qty 1

## 2020-09-10 MED ORDER — OLANZAPINE 10 MG IM SOLR
2.5000 mg | Freq: Once | INTRAMUSCULAR | Status: DC | PRN
  Filled 2020-09-10: qty 10

## 2020-09-10 MED ORDER — NOREPINEPHRINE 4 MG/250ML-% IV SOLN
2.0000 ug/min | INTRAVENOUS | Status: DC
  Administered 2020-09-10: 2 ug/min via INTRAVENOUS
  Filled 2020-09-10: qty 250

## 2020-09-10 MED ORDER — DEXTROSE 50 % IV SOLN: 25.0000 mL | Freq: Once | INTRAVENOUS | Status: AC

## 2020-09-10 MED ORDER — CHLORHEXIDINE GLUCONATE CLOTH 2 % EX PADS: 6.0000 | MEDICATED_PAD | Freq: Every day | CUTANEOUS | Status: DC

## 2020-09-10 MED ORDER — HEPARIN BOLUS VIA INFUSION
4000.0000 [IU] | Freq: Once | INTRAVENOUS | Status: DC
  Filled 2020-09-10: qty 4000

## 2020-09-10 MED ORDER — THIAMINE HCL 100 MG/ML IJ SOLN
100.0000 mg | Freq: Every day | INTRAMUSCULAR | Status: DC
  Administered 2020-09-10 – 2020-09-11 (×2): 100 mg via INTRAVENOUS
  Filled 2020-09-10 (×2): qty 2

## 2020-09-10 MED ORDER — HALOPERIDOL LACTATE 5 MG/ML IJ SOLN
5.0000 mg | Freq: Four times a day (QID) | INTRAMUSCULAR | Status: AC | PRN
Start: 2020-09-10 — End: 2020-09-10
  Administered 2020-09-10: 5 mg via INTRAVENOUS
  Filled 2020-09-10: qty 1

## 2020-09-10 MED ORDER — DEXTROSE 50 % IV SOLN
INTRAVENOUS | Status: AC
  Administered 2020-09-10: 25 mL via INTRAVENOUS
  Filled 2020-09-10: qty 50

## 2020-09-10 MED ORDER — DIAZEPAM 5 MG/ML IJ SOLN
5.0000 mg | INTRAMUSCULAR | Status: DC | PRN
  Administered 2020-09-10 – 2020-09-15 (×7): 5 mg via INTRAVENOUS
  Filled 2020-09-10 (×8): qty 2

## 2020-09-10 MED ORDER — SODIUM CHLORIDE 0.9 % IV SOLN: 250.0000 mL | INTRAVENOUS | Status: DC

## 2020-09-10 MED ORDER — DEXMEDETOMIDINE HCL IN NACL 200 MCG/50ML IV SOLN
0.4000 ug/kg/h | INTRAVENOUS | Status: DC
  Administered 2020-09-10: 0.4 ug/kg/h via INTRAVENOUS
  Filled 2020-09-10 (×2): qty 100

## 2020-09-10 MED ORDER — SODIUM CHLORIDE 0.9 % IV SOLN: INTRAVENOUS | Status: DC | PRN

## 2020-09-10 MED ORDER — ORAL CARE MOUTH RINSE
15.0000 mL | Freq: Two times a day (BID) | OROMUCOSAL | Status: DC
  Administered 2020-09-10 – 2020-09-25 (×28): 15 mL via OROMUCOSAL

## 2020-09-10 MED ORDER — DIAZEPAM 5 MG/ML IJ SOLN
INTRAMUSCULAR | Status: AC
  Filled 2020-09-10: qty 2

## 2020-09-10 MED ORDER — INSULIN ASPART 100 UNIT/ML IJ SOLN
0.0000 [IU] | INTRAMUSCULAR | Status: DC
  Administered 2020-09-11 (×2): 1 [IU] via SUBCUTANEOUS
  Administered 2020-09-11 (×3): 2 [IU] via SUBCUTANEOUS
  Administered 2020-09-12 (×2): 3 [IU] via SUBCUTANEOUS
  Administered 2020-09-12: 1 [IU] via SUBCUTANEOUS
  Administered 2020-09-12: 2 [IU] via SUBCUTANEOUS
  Administered 2020-09-12: 5 [IU] via SUBCUTANEOUS
  Administered 2020-09-13: 2 [IU] via SUBCUTANEOUS
  Administered 2020-09-13: 1 [IU] via SUBCUTANEOUS
  Administered 2020-09-13 (×2): 2 [IU] via SUBCUTANEOUS
  Administered 2020-09-13: 3 [IU] via SUBCUTANEOUS
  Administered 2020-09-13 (×2): 2 [IU] via SUBCUTANEOUS
  Administered 2020-09-14: 5 [IU] via SUBCUTANEOUS
  Administered 2020-09-14: 2 [IU] via SUBCUTANEOUS
  Administered 2020-09-14: 5 [IU] via SUBCUTANEOUS
  Administered 2020-09-14: 3 [IU] via SUBCUTANEOUS
  Administered 2020-09-14: 2 [IU] via SUBCUTANEOUS
  Administered 2020-09-15: 3 [IU] via SUBCUTANEOUS
  Administered 2020-09-15 (×3): 5 [IU] via SUBCUTANEOUS
  Administered 2020-09-15 (×2): 3 [IU] via SUBCUTANEOUS
  Administered 2020-09-16: 5 [IU] via SUBCUTANEOUS
  Administered 2020-09-16 (×2): 3 [IU] via SUBCUTANEOUS
  Administered 2020-09-16: 2 [IU] via SUBCUTANEOUS
  Administered 2020-09-16 – 2020-09-17 (×4): 3 [IU] via SUBCUTANEOUS
  Administered 2020-09-17 (×4): 2 [IU] via SUBCUTANEOUS
  Administered 2020-09-17: 3 [IU] via SUBCUTANEOUS
  Administered 2020-09-18: 5 [IU] via SUBCUTANEOUS
  Administered 2020-09-18: 1 [IU] via SUBCUTANEOUS
  Administered 2020-09-18: 2 [IU] via SUBCUTANEOUS
  Administered 2020-09-18: 3 [IU] via SUBCUTANEOUS
  Administered 2020-09-18: 5 [IU] via SUBCUTANEOUS
  Administered 2020-09-19 (×2): 2 [IU] via SUBCUTANEOUS
  Administered 2020-09-19: 1 [IU] via SUBCUTANEOUS
  Administered 2020-09-20 (×2): 3 [IU] via SUBCUTANEOUS
  Administered 2020-09-20: 2 [IU] via SUBCUTANEOUS
  Administered 2020-09-20: 1 [IU] via SUBCUTANEOUS
  Administered 2020-09-20 – 2020-09-21 (×2): 2 [IU] via SUBCUTANEOUS
  Administered 2020-09-21: 3 [IU] via SUBCUTANEOUS
  Administered 2020-09-21: 1 [IU] via SUBCUTANEOUS
  Administered 2020-09-21: 2 [IU] via SUBCUTANEOUS
  Administered 2020-09-22: 1 [IU] via SUBCUTANEOUS
  Administered 2020-09-22: 2 [IU] via SUBCUTANEOUS
  Administered 2020-09-22: 1 [IU] via SUBCUTANEOUS
  Administered 2020-09-22 (×2): 2 [IU] via SUBCUTANEOUS
  Administered 2020-09-23: 1 [IU] via SUBCUTANEOUS
  Administered 2020-09-23 (×2): 2 [IU] via SUBCUTANEOUS

## 2020-09-10 MED ORDER — DEXTROSE 10 % IV SOLN: INTRAVENOUS | Status: DC

## 2020-09-10 MED ORDER — DIAZEPAM 5 MG/ML IJ SOLN
5.0000 mg | Freq: Once | INTRAMUSCULAR | Status: AC
  Administered 2020-09-10: 5 mg via INTRAVENOUS

## 2020-09-10 MED ORDER — HEPARIN (PORCINE) 25000 UT/250ML-% IV SOLN
1300.0000 [IU]/h | INTRAVENOUS | Status: DC
  Administered 2020-09-10 – 2020-09-11 (×2): 1100 [IU]/h via INTRAVENOUS
  Filled 2020-09-10 (×2): qty 250

## 2020-09-10 MED ORDER — CHLORHEXIDINE GLUCONATE CLOTH 2 % EX PADS
6.0000 | MEDICATED_PAD | Freq: Every day | CUTANEOUS | Status: DC
  Administered 2020-09-10 (×2): 6 via TOPICAL

## 2020-09-10 MED ORDER — DEXTROSE 50 % IV SOLN
INTRAVENOUS | Status: AC
  Filled 2020-09-10: qty 50

## 2020-09-10 MED ORDER — HYDROMORPHONE HCL 1 MG/ML IJ SOLN
0.5000 mg | Freq: Once | INTRAMUSCULAR | Status: AC
  Administered 2020-09-10: 0.5 mg via INTRAVENOUS
  Filled 2020-09-10: qty 1

## 2020-09-10 MED ORDER — DEXTROSE 50 % IV SOLN: 12.5000 g | INTRAVENOUS | Status: DC

## 2020-09-10 MED ORDER — NITROGLYCERIN 2 % TD OINT
1.0000 [in_us] | TOPICAL_OINTMENT | Freq: Three times a day (TID) | TRANSDERMAL | Status: AC
  Administered 2020-09-10 – 2020-09-12 (×6): 1 [in_us] via TOPICAL
  Filled 2020-09-10: qty 30

## 2020-09-10 MED ORDER — DEXTROSE 5 % IV SOLN: INTRAVENOUS | Status: DC

## 2020-09-10 MED ORDER — HALOPERIDOL LACTATE 5 MG/ML IJ SOLN: 5.0000 mg | Freq: Four times a day (QID) | INTRAMUSCULAR | Status: DC | PRN

## 2020-09-10 MED ORDER — DIAZEPAM 5 MG/ML IJ SOLN
5.0000 mg | INTRAMUSCULAR | Status: DC
  Administered 2020-09-10 – 2020-09-14 (×22): 5 mg via INTRAVENOUS
  Filled 2020-09-10 (×23): qty 2

## 2020-09-10 NOTE — Consult Note (Signed)
Neurology Consultation  Reason for Consult: Concern for serotonin syndrome versus opiate withdrawal Referring Physician: Dr. Florene Glen  CC: Encephalopathy  History is obtained from: Chart review, Wife via telephone, Patient unable to provide history due to AMS  HPI: Jason Hartman is a 85 y.o. male with a medical history significant for hypertension, type 2 diabetes mellitus with neuropathy, chronic kidney disease, obstructive sleep apnea, restless legs syndrome, anxiety with panic disorder, narcotic dependence, and possible mild parkinson's disease with micrographia and left upper extremity resting tremor who presented to [facility] 09/09/2020 for evaluation of diaphoresis and anxiety. Per patient's wife, Thursday 09/07/2020 Jason Hartman began to experience diaphoresis worsening on Friday 09/08/2020 soaking through his shirt evert 15 - 20 minutes and became lethargic on Friday with decreased PO intake and noted that he did not take any of his medications on Friday. She contacted his PCP who was concerned about dehydration and recommended that he go to the ED for further evaluation. EMS was activated to their home with findings of hypertension and tachycardia and transported Jason Hartman to [facility]. Initially, he was tachycardic to the 120s with a leukocytosis with a WBC 15,700, and a UA with glucosuria, ketonuria, proteinuria, and microscopic hematuria. He was transferred to the ICU 09/10/2020 for worsening agitation requiring sedation and neurology was consulted for further evaluation and management.  Hospital course is additionally been complicated by atrial flutter, which did convert back to sinus rhythm, with troponinemia for which cardiology is consulted  At baseline, Jason Hartman has severe restless leg syndrome for which he had been on a Neuropro patch, gabapentin, and hydrocodone. About one month ago (08/02/2020), his hydrocodone was replaced with tramadol with some initial improvement in sleep from his RLS. His wife states  that she manages all of his medication at home and has not given him the hydrocodone since he was switched at his last outpatient visit on 08/02/2020. His wife expressed that Jason Hartman has always had anxiety with panic disorder and recently saw a new PCP who prescribed Cymbalta and buspirone approximately 2 weeks ago. His anxiety is managed by psychiatry. She states that he sometimes becomes confused to time, calls family members by the wrong name, and has visual hallucinations mainly when he wakes from sleeping. He also has been off balance due to diabetic neuropathy and walks with a cane but he has not fallen in approximately 8 months.   Additional history provided to the MD later by Jason Hartman, patient's son who is a physical therapist.  He notes that his father has had a much struggle with restless leg syndrome as well as severe anxiety and he has significant concern about his father's medication use in the past including "taking gabapentin like candy."  While the patient's wife (son's stepmother) does manage the patient's medications there is recently been some concern about her memory as well.  The patient reports that his father's medication use has been escalating in the past few years, he has tried innumerable different medications and he has been similarly agitated at home in the past few days prior to arrival at the hospital as he is here at the hospital.  ROS: Unable to obtain due to altered mental status.   Past Medical History:  Diagnosis Date  . Cellulitis and abscess of leg, except foot   . Hypertension   . Obstructive sleep apnea (adult) (pediatric)   . Pain in joint, pelvic region and thigh   . Pneumonia, organism unspecified(486)   . Restless  legs syndrome (RLS)   . RLS (restless legs syndrome) 09/01/2012  . Thoracic or lumbosacral neuritis or radiculitis, unspecified   . Type II or unspecified type diabetes mellitus without mention of complication, not stated as uncontrolled    . Unspecified disease of pericardium   . Unspecified hereditary and idiopathic peripheral neuropathy    Past Surgical History:  Procedure Laterality Date  . ANAL FISSURE REPAIR    . pericarditis  2009   Family History  Problem Relation Age of Onset  . Diabetes Father    Social History:   reports that he quit smoking about 40 years ago. He has never used smokeless tobacco. He reports that he does not drink alcohol and does not use drugs.  Medications  Current Facility-Administered Medications:  .  0.9 %  sodium chloride infusion, 250 mL, Intravenous, Continuous, Florene Glen, A Clint Lipps., MD .  Place/Maintain arterial line, , , Until Discontinued **AND** 0.9 %  sodium chloride infusion, , Intra-arterial, PRN, Clarene Critchley, RN .  acetaminophen (TYLENOL) tablet 650 mg, 650 mg, Oral, Q6H PRN **OR** acetaminophen (TYLENOL) suppository 650 mg, 650 mg, Rectal, Q6H PRN, Opyd, Ilene Qua, MD .  Chlorhexidine Gluconate Cloth 2 % PADS 6 each, 6 each, Topical, Daily, Opyd, Ilene Qua, MD, 6 each at 09/10/20 0804 .  dextrose 10 % infusion, , Intravenous, Continuous, Elodia Florence., MD, Last Rate: 50 mL/hr at 09/10/20 0841, Rate Change at 09/10/20 0841 .  diazepam (VALIUM) 5 MG/ML injection, , , ,  .  diazepam (VALIUM) injection 5 mg, 5 mg, Intravenous, Q4H PRN, Lang Snow, FNP .  diazepam (VALIUM) injection 5 mg, 5 mg, Intravenous, Q4H, Lang Snow, FNP .  heparin injection 5,000 Units, 5,000 Units, Subcutaneous, Q8H, Opyd, Ilene Qua, MD, 5,000 Units at 09/10/20 0640 .  HYDROmorphone (DILAUDID) injection 0.5 mg, 0.5 mg, Intravenous, Q4H, Newell Coral E, MD .  insulin aspart (novoLOG) injection 0-9 Units, 0-9 Units, Subcutaneous, Q4H, Berle Mull M, MD .  lactated ringers bolus 1,000 mL, 1,000 mL, Intravenous, Once, Elodia Florence., MD .  lactated ringers infusion, , Intravenous, Continuous, Elodia Florence., MD, Last Rate: 100 mL/hr at 09/09/20 1754, Infusion  Verify at 09/09/20 1754 .  LORazepam (ATIVAN) injection 1 mg, 1 mg, Intravenous, Q4H PRN, Elodia Florence., MD, 1 mg at 09/10/20 0028 .  MEDLINE mouth rinse, 15 mL, Mouth Rinse, BID, Florene Glen, A Clint Lipps., MD .  naloxone [facility]) injection 0.4 mg, 0.4 mg, Intravenous, PRN, Elodia Florence., MD .  norepinephrine (LEVOPHED) 4mg  in 279mL premix infusion, 2-10 mcg/min, Intravenous, Titrated, Elodia Florence., MD, Last Rate: 7.5 mL/hr at 09/10/20 0931, 2 mcg/min at 09/10/20 0931 .  ofloxacin (OCUFLOX) 0.3 % ophthalmic solution 1 drop, 1 drop, Both Eyes, QID PRN, Elodia Florence., MD .  ondansetron [facility]) tablet 4 mg, 4 mg, Oral, Q6H PRN **OR** ondansetron (ZOFRAN) injection 4 mg, 4 mg, Intravenous, Q6H PRN, Opyd, Ilene Qua, MD .  senna-docusate (Senokot-S) tablet 1 tablet, 1 tablet, Oral, QHS PRN, Opyd, Timothy S, MD .  technetium albumin aggregated (MAA) injection solution 4.1 millicurie, 4.1 millicurie, Intravenous, Once PRN, Felipa Emory, MD  Exam: Current vital signs: BP (!) 84/42   Pulse 85   Temp (!) 97.5 F (36.4 C) (Oral)   Resp (!) 22   Ht 5\' 4"  (1.626 m)   Wt 80.7 kg   SpO2 95%   BMI 30.55 kg/m  Vital signs in last 24 hours:  Temp:  [97.5 F (36.4 C)] 97.5 F (36.4 C) (06/04 1719) Pulse Rate:  [40-139] 85 (06/05 0834) Resp:  [12-32] 22 (06/05 0834) BP: (84-159)/(42-107) 84/42 (06/05 0834) SpO2:  [91 %-100 %] 95 % (06/05 0834)  GENERAL: Somnolent off of sedation, in no obvious distress on evaluation.  On later attending evaluation quite agitated, trying to get out of bed, requiring multiple people to help redirect him before becoming somnolent again when administered 0.5 mg Dilaudid Head: Normocephalic and atraumatic, no neck stiffness EENT: No OP obstruction, normal conjunctivae, dry mucous membranes, no apparent photophobia LUNGS: Mildly increased respiratory rate, normal respiratory effort, nasal cannula in place CV: Regular rate on telemetry,  hypotensive on cardiac monitor  ABDOMEN: Soft, non-distended Ext: cool to touch with ecchymosis of the 2nd and 3rd toes of the left foot.  Neuro examination partially limited due to sedation 09/10/2020: Diazepam 5 mg IV 06:41, 07:15 Haldol 03:30 Dilaudid 07:25, 10:39 Precedex gtt iff at 07:32 Ativan 1 mg 00:28 He additionally briefly required Levophed support  NEURO:  Mental Status: Somnolent, arouses to repeated noxious stimuli but quickly falls back to sleep. He is able to state his first name but is unable to communicate further with examiner for evaluation of orientation questions. Moans with application of noxious stimuli.   Intermittently opens eyes to loud voice briefly 1 - 2 seconds but quickly closes them with resistance to passive eye opening.  Speech is limited with repeated stimulation, he does not attempt to name, repeat, or converse with examiner.  There is no evidence of neglect noted on examination.  Cranial Nerves:  II: PERRL 2 mm / brisk. Only briefly opens eyes and does not fixate or track.  III, IV, VI: Oculocephalic reflex intact.  Hartman: Unable to assess facial sensation.  VII: Face is symmetric resting and grimacing. VIII: Hearing is intact to loud voice with repeated stimulation IX, X: Unable to assess due to patient mental status.  XI: Head appears midline.  XII: Patient does not protrude tongue to command Motor: Bilateral lower extremities with spontaneous restless movements and brisk withdrawal to noxious stimuli.  Bilateral upper extremities 2/5 with minimal spontaneous movement and no attempt at antigravity movement on examination.  On later attending examination just before Dilaudid administration he moves the bilateral upper and lower extremities equally spontaneously, trying to get out of bed though he is redirectable Tone is decreased. Bulk is normal.  No rigidity, tremor, or clonus noted on examination. Sensation: Grimaces with application of noxious stimuli  throughout with brisk withdrawal of lower extremities with noxious stimulation. Bilateral upper extremities with minimal withdrawal with application of noxious stimuli bilaterally but with grimace and vocalization with noxious stimuli equally throughout all 4 extremities.  Coordination: Unable to assess due to patient mental status DTRs: 2+ and symmetric triceps and patellae, trace biceps and brachioradialis bilaterally Plantars: Toes downgoing bilaterally Gait: deferred  Labs I have reviewed labs in epic and the results pertinent to this consultation are:  He has developed a significant AKI from creatinine of 1.3 on admission to 2.2 at 9:30 AM today the elevated AST at 64 and T bili improving from 2.2 on admission to 1.8 Troponin peak at 388 now downtrending Ammonia 23 Folate 20.7 (normal) vitamin B12 718 (normal) D-dimer was elevated at 1.87 for which VQ scan was obtained which was negative for PE  CBC    Component Value Date/Time   WBC 20.0 (H) 09/10/2020 0926   RBC 6.16 (H) 09/10/2020 0926   HGB 17.6 (  H) 09/10/2020 0926   HGB 13.2 11/12/2016 1129   HCT 54.7 (H) 09/10/2020 0926   HCT 41.9 11/12/2016 1129   PLT 210 09/10/2020 0926   PLT 185 11/12/2016 1129   MCV 88.8 09/10/2020 0926   MCV 83 11/12/2016 1129   MCH 28.6 09/10/2020 0926   MCHC 32.2 09/10/2020 0926   RDW 14.3 09/10/2020 0926   RDW 15.2 11/12/2016 1129   LYMPHSABS 0.7 09/08/2020 2308   LYMPHSABS 1.4 11/12/2016 1129   MONOABS 0.9 09/08/2020 2308   EOSABS 0.0 09/08/2020 2308   EOSABS 0.2 11/12/2016 1129   BASOSABS 0.0 09/08/2020 2308   BASOSABS 0.0 11/12/2016 1129   CMP     Component Value Date/Time   NA 134 (L) 09/08/2020 2339   NA 143 11/12/2016 1128   K 4.3 09/08/2020 2339   CL 98 09/08/2020 2339   CO2 24 02/19/2020 1256   GLUCOSE 308 (H) 09/08/2020 2339   GLUCOSE 136 (H) 03/18/2006 1245   BUN 39 (H) 09/08/2020 2339   BUN 27 11/12/2016 1128   CREATININE 1.30 (H) 09/08/2020 2339   CALCIUM 9.7  02/19/2020 1256   CALCIUM 10.2 09/19/2006 0100   PROT 7.6 09/08/2020 2308   PROT 6.9 11/12/2016 1128   ALBUMIN 4.3 09/08/2020 2308   ALBUMIN 4.3 11/12/2016 1128   AST 27 09/08/2020 2308   ALT 32 09/08/2020 2308   ALKPHOS 65 09/08/2020 2308   BILITOT 2.7 (H) 09/08/2020 2308   BILITOT 0.4 11/12/2016 1128   GFRNONAA 53 (L) 02/19/2020 1256   GFRAA 56 (L) 04/27/2019 1015   Lipid Panel     Component Value Date/Time   CHOL 160 06/19/2006 1510   TRIG 238 (HH) 06/19/2006 1510   HDL 34.4 (L) 06/19/2006 1510   CHOLHDL 4.7 CALC 06/19/2006 1510   VLDL 48 (H) 06/19/2006 1510   LDLDIRECT 92.1 06/19/2006 1510   Urinalysis    Component Value Date/Time   COLORURINE YELLOW 09/08/2020 2335   APPEARANCEUR HAZY (A) 09/08/2020 2335   LABSPEC 1.017 09/08/2020 2335   PHURINE 5.0 09/08/2020 2335   GLUCOSEU >=500 (A) 09/08/2020 2335   HGBUR MODERATE (A) 09/08/2020 2335   BILIRUBINUR NEGATIVE 09/08/2020 2335   KETONESUR 80 (A) 09/08/2020 2335   PROTEINUR >=300 (A) 09/08/2020 2335   UROBILINOGEN 0.2 12/04/2013 0210   NITRITE NEGATIVE 09/08/2020 2335   LEUKOCYTESUR TRACE (A) 09/08/2020 2335  Urine culture with less than 10,000 colonies, no further work-up  Drugs of Abuse     Component Value Date/Time   LABOPIA NONE DETECTED 09/08/2020 2335   COCAINSCRNUR NONE DETECTED 09/08/2020 2335   LABBENZ NONE DETECTED 09/08/2020 2335   AMPHETMU NONE DETECTED 09/08/2020 2335   THCU NONE DETECTED 09/08/2020 2335   LABBARB NONE DETECTED 09/08/2020 2335    No results found for: VITAMINB12 Results for orders placed or performed during the hospital encounter of 09/08/20  Resp Panel by RT-PCR (Flu A&B, Covid) Nasopharyngeal Swab     Status: None   Collection Time: 09/08/20 11:41 PM   Specimen: Nasopharyngeal Swab; Nasopharyngeal(NP) swabs in vial transport medium  Result Value Ref Range Status   SARS Coronavirus 2 by RT PCR NEGATIVE NEGATIVE Final    Comment: (NOTE) SARS-CoV-2 target nucleic acids are NOT  DETECTED.  The SARS-CoV-2 RNA is generally detectable in upper respiratory specimens during the acute phase of infection. The lowest concentration of SARS-CoV-2 viral copies this assay can detect is 138 copies/mL. A negative result does not preclude SARS-Cov-2 infection and should not be used as  the sole basis for treatment or other patient management decisions. A negative result may occur with  improper specimen collection/handling, submission of specimen other than nasopharyngeal swab, presence of viral mutation(s) within the areas targeted by this assay, and inadequate number of viral copies(<138 copies/mL). A negative result must be combined with clinical observations, patient history, and epidemiological information. The expected result is Negative.  Fact Sheet for Patients:  EntrepreneurPulse.com.au  Fact Sheet for Healthcare Providers:  IncredibleEmployment.be  This test is no t yet approved or cleared by the [place] FDA and  has been authorized for detection and/or diagnosis of SARS-CoV-2 by FDA under an Emergency Use Authorization (EUA). This EUA will remain  in effect (meaning this test can be used) for the duration of the COVID-19 declaration under Section 564(b)(1) of the Act, 21 U.S.C.section 360bbb-3(b)(1), unless the authorization is terminated  or revoked sooner.       Influenza A by PCR NEGATIVE NEGATIVE Final   Influenza B by PCR NEGATIVE NEGATIVE Final    Comment: (NOTE) The Xpert Xpress SARS-CoV-2/FLU/RSV plus assay is intended as an aid in the diagnosis of influenza from Nasopharyngeal swab specimens and should not be used as a sole basis for treatment. Nasal washings and aspirates are unacceptable for Xpert Xpress SARS-CoV-2/FLU/RSV testing.  Fact Sheet for Patients: EntrepreneurPulse.com.au  Fact Sheet for Healthcare Providers: IncredibleEmployment.be  This test is not yet  approved or cleared by the [place] FDA and has been authorized for detection and/or diagnosis of SARS-CoV-2 by FDA under an Emergency Use Authorization (EUA). This EUA will remain in effect (meaning this test can be used) for the duration of the COVID-19 declaration under Section 564(b)(1) of the Act, 21 U.S.C. section 360bbb-3(b)(1), unless the authorization is terminated or revoked.  Performed at [facility], [place] [address], [place], [place] [address]   Blood culture (routine x 2)     Status: None (Preliminary result)   Collection Time: 09/09/20  1:03 AM   Specimen: BLOOD  Result Value Ref Range Status   Specimen Description   Final    BLOOD RIGHT WRIST Performed at [facility] [address] [place], [place] [address]    Special Requests   Final    BOTTLES DRAWN AEROBIC AND ANAEROBIC Blood Culture adequate volume Performed at [facility] [address], [place], [place] [address]    Culture  Setup Time   Final    GRAM POSITIVE COCCI IN CLUSTERS IN BOTH AEROBIC AND ANAEROBIC BOTTLES CRITICAL RESULT CALLED TO, READ BACK BY AND VERIFIED WITH: PHARMD MICHELLE LILLISTAN 09/09/2020 AT 2220 A.HUGHES Performed at [facility], [place] [address] [place], [place] [address]    Culture GRAM POSITIVE COCCI  Final   Report Status PENDING  Incomplete  Blood Culture ID Panel (Reflexed)     Status: Abnormal   Collection Time: 09/09/20  1:03 AM  Result Value Ref Range Status   Enterococcus faecalis NOT DETECTED NOT DETECTED Final   Enterococcus Faecium NOT DETECTED NOT DETECTED Final   Listeria monocytogenes NOT DETECTED NOT DETECTED Final   Staphylococcus species DETECTED (A) NOT DETECTED Final    Comment: CRITICAL RESULT CALLED TO, READ BACK BY AND VERIFIED WITH: PHARMD MICHELLE LILLISTAN 09/09/2020 AT 2220 A.HUGHES    Staphylococcus aureus (BCID) NOT DETECTED NOT DETECTED Final   Staphylococcus epidermidis  DETECTED (A) NOT DETECTED Final    Comment: Methicillin (oxacillin) resistant coagulase negative staphylococcus. Possible blood culture contaminant (unless isolated from more  than one blood culture draw or clinical case suggests pathogenicity). No antibiotic treatment is indicated for blood  culture contaminants. CRITICAL RESULT CALLED TO, READ BACK BY AND VERIFIED WITH: PHARMD MICHELLE LILLISTAN 09/09/2020 AT 2220 A.HUGHES    Staphylococcus lugdunensis NOT DETECTED NOT DETECTED Final   Streptococcus species NOT DETECTED NOT DETECTED Final   Streptococcus agalactiae NOT DETECTED NOT DETECTED Final   Streptococcus pneumoniae NOT DETECTED NOT DETECTED Final   Streptococcus pyogenes NOT DETECTED NOT DETECTED Final   A.calcoaceticus-baumannii NOT DETECTED NOT DETECTED Final   Bacteroides fragilis NOT DETECTED NOT DETECTED Final   Enterobacterales NOT DETECTED NOT DETECTED Final   Enterobacter cloacae complex NOT DETECTED NOT DETECTED Final   Escherichia coli NOT DETECTED NOT DETECTED Final   Klebsiella aerogenes NOT DETECTED NOT DETECTED Final   Klebsiella oxytoca NOT DETECTED NOT DETECTED Final   Klebsiella pneumoniae NOT DETECTED NOT DETECTED Final   Proteus species NOT DETECTED NOT DETECTED Final   Salmonella species NOT DETECTED NOT DETECTED Final   Serratia marcescens NOT DETECTED NOT DETECTED Final   Haemophilus influenzae NOT DETECTED NOT DETECTED Final   Neisseria meningitidis NOT DETECTED NOT DETECTED Final   Pseudomonas aeruginosa NOT DETECTED NOT DETECTED Final   Stenotrophomonas maltophilia NOT DETECTED NOT DETECTED Final   Candida albicans NOT DETECTED NOT DETECTED Final   Candida auris NOT DETECTED NOT DETECTED Final   Candida glabrata NOT DETECTED NOT DETECTED Final   Candida krusei NOT DETECTED NOT DETECTED Final   Candida parapsilosis NOT DETECTED NOT DETECTED Final   Candida tropicalis NOT DETECTED NOT DETECTED Final   Cryptococcus neoformans/gattii NOT DETECTED NOT  DETECTED Final   Methicillin resistance mecA/C DETECTED (A) NOT DETECTED Final    Comment: CRITICAL RESULT CALLED TO, READ BACK BY AND VERIFIED WITH: PHARMD MICHELLE LILLISTAN 09/09/2020 AT 2220 A.HUGHES Performed at [facility], [place] [address], [place], [place] [address]    Imaging I have reviewed the images obtained: Imaging: CT without acute intracranial process, there is significant atrophy slightly more predominant in the frontotemporal lobes with chronic microvascular changes as well as old lacunar infarcts  Assessment: 85 year old male with history as above who presented to the ED for evaluation of anxiety, diaphoresis, lethargy, and decreased PO intake. Initially found to be tachycardic, hypoglycemic, with borderline UA and increasing encephalopathy and agitation overnight requiring transfer to ICU and sedation.  - Examination reveals somnolent patient with multiple sedating medications overnight for agitation: Diazepam 5 mg IV 06:41, 07:15, Haldol 03:30, Dilaudid 07:25, 10:39, Precedex gtt iff at 07:32, Ativan 1 mg 00:28). Patient hypotensive with SBP in the 70s during initial examination, improved while examiner at bedside to a SBP in the 110's.  - Initial work up significant for leukocytosis with a WBC 15.7k increasing to 20, tachycardia to the 120s, creatinine at 1.3 consistent with patient's baseline creatinine, UA with glycosuria, ketonuria, microscopic hematuria, and proteinuria s/p Azactam. Pulmonary perfusion imaging with very low probability for PE.  - Patient with multiple recent medication changes for RLS and anxiety- d/c hydrocodone approximately 1 month ago and replaced with tramadol, approximately 2 weeks ago placed on buspirone and Cymbalta for anxiety. - Please note diagnosis of Parkinson's disease is inaccurate, has been updated to parkinsonism - DDx includes toxic metabolic encephalopathy, polypharmacy in elderly adult with history of CKD, delirium, serotonin  syndrome - patient without hyperreflexia, rigidity, or clonus on examination, however Cymbalta and Buspar initiated together approximtely 2 weeks ago; less likely opiate withdrawal or new onset seizures.  Impression: Acute encephalopathy- likely multifactorial  Mixed hypoactive/hyperactive delirium  Recommendations: - STAT CT Head personally reviewed by attending physician, agree with radiology read there is no acute intracranial process - Hold on MRI as patient unlikely to tolerate this and do not have an index of suspicion high enough for structural brain injury given nonfocal exam to outweigh the risks of anesthesia for such a scan - Routine EEG (will be completed Monday, ordered) - Vitamin B12, and ammonia resulted normal, please follow-up pending thiamine level  - With unexplained leukocytosis and altered mental status, if no other etiology of infection is determined patient should have lumbar puncture with protein, glucose, cell counts and tubes 1 and 4, bacterial culture, HSV 1 and 2 testing, VZV testing. This will need to be completed by IR if unable to be done bedside by CCM team; given liquid stool consider C. difficile testing first to see if that explains his leukocytosis. - Remainder of infectious work-up per primary team, please note infection can play significant role in delirium - Agree with extended drug of abuse panel ordered previously - Appreciate A-flutter management by cardiology - Appreciate agitation management by CCM team agree with avoiding Haldol; likely has secondary parkinsonism based on conversation with family and not true Parkinson's disease though would err on the side of caution and avoid antipsychotics  Pt seen by NP/Neuro and later by MD. Note/plan to be edited by MD as needed.  Anibal Henderson, AGAC-NP [facility] Pager: [phone]  Attending Neurologist's note:  In brief this is a patient for whom we are consulted regarding agitation and  concern for potential toxidrome.  Suspect there may be several competing toxidromes occurring concurrently given the polypharmacy patient has been experiencing at home including dopaminergic agents, gabapentioids, opiates, serotonergic/norepinephrine agents.  Per family there is also significant psychiatric history of anxiety and restless leg symptoms that have been historically refractory to treatment which may be playing a significant role.  Suspect the combination of these factors are resulting in a mixed hypoactive/hyperactive delirium.  Low concern for a primary neurological process, but have obtained head CT to rule out acute structural process and will obtain EEG as well.  Suspect patient will not tolerate MRI at this time, so will not pursue this imaging for now.  Lumbar puncture should be obtained if no other etiology for his brisk leukocytosis is determined  I personally saw this patient, gathering history, performing a full neurologic examination, reviewing relevant labs, personally reviewing relevant imaging including head CT, and formulated the assessment and plan, adding the note above for completeness and clarity to accurately reflect my thoughts  Lesleigh Noe MD-PhD [facility] [phone] Available 7 AM to 7 PM, outside these hours please contact Neurologist on call listed on AMION

## 2020-09-10 NOTE — Progress Notes (Signed)
Hypoglycemic Event  CBG: 57 at 2131  Treatment: orange juice given per hypoglycemia algorithm  Symptoms: diaphoresis, slight tremor   Follow-up CBG: 82 Time: 2151 CBG Result: 92 at 2248  Possible Reasons for Event: Uncertain, pt remains on D10 at 50 infusion initiated on day shift  Comments/MD notified: M. Sharlet Salina NP     Fotini Lemus A Tyjai Matuszak

## 2020-09-10 NOTE — Progress Notes (Signed)
ANTICOAGULATION CONSULT NOTE - Follow Up Consult  Pharmacy Consult for heparin infusion Indication: atrial fibrillation  Allergies  Allergen Reactions  . Oxycodone Anaphylaxis    Other reaction(s): Other (See Comments) Cannot recall specifics  . Iodinated Diagnostic Agents Hives    alleric to renografin,isovue & omnipaque, hives, requires 13 hr prep//a.calhoun, Onset Date: [id number]     . Iodine Hives and Rash    alleric to renografin,isovue & omnipaque, hives, requires 13 hr prep//a.calhoun, Onset Date: [id number] allergic to renografin,isovue & omnipaque, hives, requires 13 hr prep//a.calhoun, Onset Date: [id number]  . Linezolid Hives and Rash       . Methadone Hcl Other (See Comments)    HALLUCINATIONS   . Promethazine Other (See Comments)    Mental status change, DELIRIUM   Other reaction(s): erratic behavior Other reaction(s): Other (See Comments) Mental status change DELIRIUM (pulled out IV)  . Morphine Sulfate Other (See Comments)    Pt not sure about allergy   . Other Hives    Other reaction(s): erratic behanvior Other reaction(s): erratic behavior  . Oxycodone Hcl Other (See Comments)    Pt not sure about allergy   . Penicillins Other (See Comments)    Pt not sure about allergy   . Quetiapine Other (See Comments)    Pt not sure about allergy  Other reaction(s): Other (See Comments) Pt not sure about allergy  Pt not sure about allergy   . Statins     Other reaction(s): muscle weakness Other reaction(s): muscle weakness  . Zolpidem Other (See Comments)    Other reaction(s): erratic behanvior Other reaction(s): Delusions (intolerance) Altered mental status  . Atorvastatin Itching and Other (See Comments)    Tired, weakness   . Carbidopa-Levodopa Anxiety  . Clindamycin Rash  . Colesevelam Other (See Comments)    tired   . Doxazosin Rash  . Lovastatin Other (See Comments)    Tired, nervousness   . Rosuvastatin Other (See Comments)    Tired, weakness      Patient Measurements: Height: 5\' 4"  (162.6 cm) Weight: 80.7 kg (178 lb) IBW/kg (Calculated) : 59.2 Heparin Dosing Weight: 76 kg  Vital Signs: Temp: 98 F (36.7 C) (06/05 1200) Temp Source: Axillary (06/05 1200) BP: 107/42 (06/05 0900) Pulse Rate: 108 (06/05 1400)  Labs: Recent Labs    09/08/20 2308 09/08/20 2339 09/09/20 0400 09/09/20 0635 09/10/20 0926 09/10/20 1108 09/10/20 1242  HGB 19.0* 20.1*  --   --  17.6*  --   --   HCT 58.5* 59.0*  --   --  54.7*  --   --   PLT 200  --   --   --  210  --   --   APTT  --   --   --   --   --  28  --   LABPROT  --   --   --   --   --  15.0  --   INR  --   --   --   --   --  1.2  --   CREATININE  --  1.30*  --   --  2.20*  --   --   CKTOTAL  --   --   --  263  --  2,578*  --   TROPONINIHS 28*  --  32*  --   --  388* 300*    Estimated Creatinine Clearance: 24.8 mL/min (A) (by C-G formula based on SCr of 2.2 mg/dL (H)).  Medications:  No anticoagulation prior to admission  Assessment: Pharmacy to dose heparin infusion for atrial fibrillation.  Hemoglobin slightly elevated and platelets WNL.  Baseline INR and aPTT WNL.  Was on heparin subq for dvt prophylaxis, last dose 6/5 1246.    Goal of Therapy:  Heparin level 0.3-0.7 units/ml Monitor platelets by anticoagulation protocol: Yes   Plan:  No heparin bolus Start heparin infusion at 1100 units/hr Check anti-Xa level in 8 hours and daily while on heparin Monitor CBC, s/s bleeding   [place] Latulippe P. Legrand Como, PharmD, [place] Please utilize Amion for appropriate phone number to reach the unit pharmacist ([place]) 09/10/2020 3:58 PM

## 2020-09-10 NOTE — Progress Notes (Addendum)
PICC order received.  Noted hx CKD with Cr Clearance of 26 this am and positive blood cx.  Recommend CVC placement for IV needs at this time.  New order from Dr Florene Glen to cancel PICC placement.  Clarene Critchley RN notified.

## 2020-09-10 NOTE — Progress Notes (Addendum)
PROGRESS NOTE    Jason Hartman  RSW:[id number] DOB: 1935/12/14 DOA: 09/08/2020 PCP: [name], MD   Chief Complaint  Patient presents with  . Anxiety  . Nausea   Brief Narrative:   Jason Hartman is Jason Hartman 85 y.o. male with medical history significant for OSA, Parkinson's, restless leg syndrome, anxiety, chronic pain, chronic kidney disease, and insulin-dependent diabetes mellitus, now presenting to the emergency department for evaluation of anxiety and diaphoresis.  Patient has Jason Hartman long history of anxiety and per report of his family, has been increasingly anxious over the past couple weeks, has been saying that he feels like he is going to die, and has been sweating profusely.  Patient states that he feels "horrible," but has difficulty providing any more specificity though he denies chest pain, abdominal pain, flank pain, cough, dysuria, vomiting, or diarrhea.  He also denies hallucinations or suicidal ideation.  Patient states that he recently was told to stop taking Xanax.  He states that he was started on Javid Kemler different medication for anxiety but does not remember the name of it.  Family reported that the patient has not been taking his medications recently.  He's been admitted with acute metabolic encephalopathy/agitated delirium with unknown etiology.  Recently started on tramadol, celexa, buspar (which would be concerning for serotonin syndrome, though physical exam not c/w this) vs medication withdrawal (opiate/etoh/benzo? - utox negative at presentation) vs other causes.  He was transferred to stepdown overnight on 6/4-5 with worsening delirium and hemodynamic instability.    Assessment & Plan:   Principal Problem:   Anxiety Active Problems:   Type II diabetes mellitus (HCC)   OBSTRUCTIVE SLEEP APNEA   Parkinson disease (HCC)   Chronic kidney disease, stage 3a (HCC)   Malignant HTN with heart disease, w/o CHF, w/o chronic kidney disease   Elevated troponin   SIRS (systemic  inflammatory response syndrome) (HCC)   Acute metabolic encephalopathy  # Acute Metabolic Encephalopathy - etiology unclear - serotonin syndrome (exam not c/w this) vs withdrawal (opiate/benzo/etoh - denies etoh hx, utox negative at presentation), vs infection or other causes - under evaluation at this time - appreciate neurology evaluation - appreciate PCCM evaluation and assistance as well   - recommending repeat CK, free t4, CMP, cortisol, b12, RPR - precedex, scheduled benzos, avoid antipsychotics, schedule dilaudid - will continue benzos/opiates at this time as allowed by hemodynamic status (see below) - precedex currently on hold - follow ammonia, b12, folate, RPR - pending urine metanephrines and catecholamines - follow blood cx/urine cx, repeat blood cultures, procalcitonin - sed rate wnl, CRP wnl - follow repeat inflammatory markers  # Systemic Inflammatory Response Syndrome - leukocytosis, tachycardia, tachypnea  - no clear infection at this point - follow procalcitonin and repeat inflammatory markers today - suspect this is 2/2 underlying cause for above  # Hypotension  - s/p IVF bolus, transiently on levophed - follow blood pressures - Tinzley Dalia line in place 6/5, will d/c when able - caution with narcotics   4. OSA  - Continue CPAP qHS   - consider nasal trumpet - caution with narcotics  # Atrial Flutter - echo 6/4 with EF 55-60%, no regional WMA.  Normal RVSF.   - continue diltiazem  - follow troponins - consider anticoagulation, will discuss with wife  # Staph Epidermidis Bacteremia - suspect contaminant (in 1/2 culture sets) - follow repeat cultures  1. Anxiety  Panic Attacks  - Patient has hx of anxiety and has been increasingly anxious, diaphoretic,  and reporting that he feels like he is dying for roughly 2 weeks now - suspect related to above - Patient reports recently stopping Xanax and benzodiazepine withdrawal was considered but he has not been prescribed  Xanax since Aug 2020 per database review  - Check TSH (wnl), urine metanephrines and catecholamines  - anxiolytics as above  3. Elevated troponin  Elevated D dimer  - HS troponin 28 then 32  - echo is without wall motion abnormalities, EF 55-60% (see report) - follow LE [place] (negative), VQ scan is low prob for PE - He denies chest pain, appears to chronic elevation in this range, and there is low-suspicion for ACS    5. Parkinson disease  - holding neupro right now (2 patches on this morning) - per PCCM note, was taking gabapentin instead of neupro? Med rec doesn't reflect, will discuss with wife (wife notes he was taking patch - plan to resume 6/6)  6. Acute Kidney Injury on CKD IIIa  - worsening today, suspect related to dehydration/volume depletion with hypotension above - follow with IVF - strict I/O, daily weights  7. Insulin-dependent DM  Hypoglycemia  - A1c was 6.5% in May 2022  - holding basal insulin at this time - hypoglycemic this AM in setting of basal insulin - D10 for now  # Elevated Bilirubin - RUQ US - wnl  # Hypertension - resume diltiazem   DVT prophylaxis: heparin  Code Status: full  Family Communication: discussed with wife 6/4 ( note incorrectly said daughter) Disposition:   Status is: Observation  The patient remains OBS appropriate and will d/c before 2 midnights.  Dispo: The patient is from: Home              Anticipated d/c is to: Home              Patient currently is not medically stable to d/c.   Difficult to place patient No       Consultants:   none  Procedures:  Echo IMPRESSIONS    1. Left ventricular ejection fraction, by estimation, is 55 to 60%. The  left ventricle has normal function. The left ventricle has no regional  wall motion abnormalities. There is mild left ventricular hypertrophy.  Left ventricular diastolic parameters  are consistent with Grade I diastolic dysfunction (impaired relaxation).  2. Right  ventricular systolic function is normal. The right ventricular  size is normal. Tricuspid regurgitation signal is inadequate for assessing  PA pressure.  3. Left atrial size was mildly dilated.  4. The mitral valve is grossly normal. Trivial mitral valve  regurgitation.  5. The aortic valve is tricuspid. There is mild calcification of the  aortic valve. Aortic valve regurgitation is trivial. Mild aortic valve  sclerosis is present, with no evidence of aortic valve stenosis.  Girtha Kilgore line 6/5 Antimicrobials:  Anti-infectives (From admission, onward)   Start     Dose/Rate Route Frequency Ordered Stop   09/09/20 0115  aztreonam (AZACTAM) 1 g in sodium chloride 0.9 % 100 mL IVPB        1 g 200 mL/hr over 30 Minutes Intravenous  Once 09/09/20 0106 09/09/20 0307         Subjective: Lethargic, minimal speech  Objective: Vitals:   09/10/20 0700 09/10/20 0834 09/10/20 0900 09/10/20 1018  BP: (!) 146/107 (!) 84/42 (!) 107/42   Pulse: 86 85 99 84  Resp: (!) 30 (!) 22 18 10   Temp:      TempSrc:  SpO2: 97% 95% (!) 88% 97%  Weight:      Height:        Intake/Output Summary (Last 24 hours) at 09/10/2020 1033 Last data filed at 09/10/2020 1000 Gross per 24 hour  Intake 2773.85 ml  Output 950 ml  Net 1823.85 ml   Filed Weights   09/08/20 2205  Weight: 80.7 kg    Examination:  General: lethargic Cardiovascular: RRR Lungs: Clear to auscultation bilaterally Abdomen: Soft, nontender, nondistended Neurological: Lethargic, does follow some commands and limited speech. Moves all extremities 4. Cranial nerves II through XII grossly intact. Extremities: No clubbing or cyanosis. No edema.      Data Reviewed: I have personally reviewed following labs and imaging studies  CBC: Recent Labs  Lab 09/08/20 2308 09/08/20 2339 09/10/20 0926  WBC 15.7*  --  20.0*  NEUTROABS 14.0*  --   --   HGB 19.0* 20.1* 17.6*  HCT 58.5* 59.0* 54.7*  MCV 87.2  --  88.8  PLT 200  --  210     Basic Metabolic Panel: Recent Labs  Lab 09/08/20 2339 09/10/20 0926  NA 134* 143  K 4.3 4.2  CL 98 108  CO2  --  24  GLUCOSE 308* 74  BUN 39* 41*  CREATININE 1.30* 2.20*  CALCIUM  --  9.3    GFR: Estimated Creatinine Clearance: 24.8 mL/min (Kaytelynn Scripter) (by C-G formula based on SCr of 2.2 mg/dL (H)).  Liver Function Tests: Recent Labs  Lab 09/08/20 2308 09/10/20 0926  AST 27 64*  ALT 32 37  ALKPHOS 65 46  BILITOT 2.7* 1.8*  PROT 7.6 5.8*  ALBUMIN 4.3 3.4*    CBG: Recent Labs  Lab 09/10/20 0413 09/10/20 0431 09/10/20 0624 09/10/20 0715 09/10/20 0738  GLUCAP 59* 99 58* 49* 261*     Recent Results (from the past 240 hour(s))  Resp Panel by RT-PCR (Flu Naphtali Riede&B, Covid) Nasopharyngeal Swab     Status: None   Collection Time: 09/08/20 11:41 PM   Specimen: Nasopharyngeal Swab; Nasopharyngeal(NP) swabs in vial transport medium  Result Value Ref Range Status   SARS Coronavirus 2 by RT PCR NEGATIVE NEGATIVE Final    Comment: (NOTE) SARS-CoV-2 target nucleic acids are NOT DETECTED.  The SARS-CoV-2 RNA is generally detectable in upper respiratory specimens during the acute phase of infection. The lowest concentration of SARS-CoV-2 viral copies this assay can detect is 138 copies/mL. Shayonna Ocampo negative result does not preclude SARS-Cov-2 infection and should not be used as the sole basis for treatment or other patient management decisions. Yutaka Holberg negative result may occur with  improper specimen collection/handling, submission of specimen other than nasopharyngeal swab, presence of viral mutation(s) within the areas targeted by this assay, and inadequate number of viral copies(<138 copies/mL). Willam Munford negative result must be combined with clinical observations, patient history, and epidemiological information. The expected result is Negative.  Fact Sheet for Patients:  EntrepreneurPulse.com.au  Fact Sheet for Healthcare Providers:   IncredibleEmployment.be  This test is no t yet approved or cleared by the [place] FDA and  has been authorized for detection and/or diagnosis of SARS-CoV-2 by FDA under an Emergency Use Authorization (EUA). This EUA will remain  in effect (meaning this test can be used) for the duration of the COVID-19 declaration under Section 564(b)(1) of the Act, 21 U.S.C.section 360bbb-3(b)(1), unless the authorization is terminated  or revoked sooner.       Influenza Litha Lamartina by PCR NEGATIVE NEGATIVE Final   Influenza B by PCR NEGATIVE NEGATIVE  Final    Comment: (NOTE) The Xpert Xpress SARS-CoV-2/FLU/RSV plus assay is intended as an aid in the diagnosis of influenza from Nasopharyngeal swab specimens and should not be used as Chezney Huether sole basis for treatment. Nasal washings and aspirates are unacceptable for Xpert Xpress SARS-CoV-2/FLU/RSV testing.  Fact Sheet for Patients: EntrepreneurPulse.com.au  Fact Sheet for Healthcare Providers: IncredibleEmployment.be  This test is not yet approved or cleared by the [place] FDA and has been authorized for detection and/or diagnosis of SARS-CoV-2 by FDA under an Emergency Use Authorization (EUA). This EUA will remain in effect (meaning this test can be used) for the duration of the COVID-19 declaration under Section 564(b)(1) of the Act, 21 U.S.C. section 360bbb-3(b)(1), unless the authorization is terminated or revoked.  Performed at [facility] ([facility] [place] [address], [place], [place] [address]   Blood culture (routine x 2)     Status: None (Preliminary result)   Collection Time: 09/09/20  1:03 AM   Specimen: BLOOD  Result Value Ref Range Status   Specimen Description   Final    BLOOD RIGHT WRIST Performed at [facility] [address], [place], [place] [address]    Special Requests   Final    BOTTLES DRAWN AEROBIC AND ANAEROBIC Blood Culture adequate  volume Performed at [facility] [address] [place], [place] [address]    Culture  Setup Time   Final    GRAM POSITIVE COCCI IN CLUSTERS IN BOTH AEROBIC AND ANAEROBIC BOTTLES CRITICAL RESULT CALLED TO, READ BACK BY AND VERIFIED WITH: PHARMD MICHELLE LILLISTAN 09/09/2020 AT 2220 Cristian [email or web address].HUGHES Performed at [facility], [place] [address], [place], [place] [address]    Culture GRAM POSITIVE COCCI  Final   Report Status PENDING  Incomplete  Blood Culture ID Panel (Reflexed)     Status: Abnormal   Collection Time: 09/09/20  1:03 AM  Result Value Ref Range Status   Enterococcus faecalis NOT DETECTED NOT DETECTED Final   Enterococcus Faecium NOT DETECTED NOT DETECTED Final   Listeria monocytogenes NOT DETECTED NOT DETECTED Final   Staphylococcus species DETECTED (Chidiebere Wynn) NOT DETECTED Final    Comment: CRITICAL RESULT CALLED TO, READ BACK BY AND VERIFIED WITH: PHARMD MICHELLE LILLISTAN 09/09/2020 AT 2220 Keeon [email or web address]    Staphylococcus aureus (BCID) NOT DETECTED NOT DETECTED Final   Staphylococcus epidermidis DETECTED (Nariya Neumeyer) NOT DETECTED Final    Comment: Methicillin (oxacillin) resistant coagulase negative staphylococcus. Possible blood culture contaminant (unless isolated from more than one blood culture draw or clinical case suggests pathogenicity). No antibiotic treatment is indicated for blood  culture contaminants. CRITICAL RESULT CALLED TO, READ BACK BY AND VERIFIED WITH: PHARMD MICHELLE LILLISTAN 09/09/2020 AT 2220 Sadarius Norman.HUGHES    Staphylococcus lugdunensis NOT DETECTED NOT DETECTED Final   Streptococcus species NOT DETECTED NOT DETECTED Final   Streptococcus agalactiae NOT DETECTED NOT DETECTED Final   Streptococcus pneumoniae NOT DETECTED NOT DETECTED Final   Streptococcus pyogenes NOT DETECTED NOT DETECTED Final   Sokha Craker.calcoaceticus-baumannii NOT DETECTED NOT DETECTED Final   Bacteroides fragilis NOT DETECTED NOT DETECTED Final   Enterobacterales NOT DETECTED NOT DETECTED  Final   Enterobacter cloacae complex NOT DETECTED NOT DETECTED Final   Escherichia coli NOT DETECTED NOT DETECTED Final   Klebsiella aerogenes NOT DETECTED NOT DETECTED Final   Klebsiella oxytoca NOT DETECTED NOT DETECTED Final   Klebsiella pneumoniae NOT DETECTED NOT DETECTED Final   Proteus species NOT DETECTED NOT DETECTED Final   Salmonella species NOT DETECTED NOT DETECTED Final  Serratia marcescens NOT DETECTED NOT DETECTED Final   Haemophilus influenzae NOT DETECTED NOT DETECTED Final   Neisseria meningitidis NOT DETECTED NOT DETECTED Final   Pseudomonas aeruginosa NOT DETECTED NOT DETECTED Final   Stenotrophomonas maltophilia NOT DETECTED NOT DETECTED Final   Candida albicans NOT DETECTED NOT DETECTED Final   Candida auris NOT DETECTED NOT DETECTED Final   Candida glabrata NOT DETECTED NOT DETECTED Final   Candida krusei NOT DETECTED NOT DETECTED Final   Candida parapsilosis NOT DETECTED NOT DETECTED Final   Candida tropicalis NOT DETECTED NOT DETECTED Final   Cryptococcus neoformans/gattii NOT DETECTED NOT DETECTED Final   Methicillin resistance mecA/C DETECTED (Lowell Mcgurk) NOT DETECTED Final    Comment: CRITICAL RESULT CALLED TO, READ BACK BY AND VERIFIED WITH: PHARMD MICHELLE LILLISTAN 09/09/2020 AT 2220 Kenzie [email or web address] Performed at [facility], [place] [address], [place], [place] [address]          Radiology Studies: NM Pulmonary Perfusion  Result Date: 09/09/2020 CLINICAL DATA:  Elevated D-dimer, rule out PE EXAM: NUCLEAR MEDICINE PERFUSION LUNG SCAN TECHNIQUE: Perfusion images were obtained in multiple projections after intravenous injection of radiopharmaceutical. Ventilation scans intentionally deferred if perfusion scan and chest x-ray adequate for interpretation during COVID 19 epidemic. RADIOPHARMACEUTICALS:  4.1 mCi Tc-14m MAA IV COMPARISON:  Chest radiograph, 09/08/2020 FINDINGS: Normal, homogeneous perfusion of the lungs bilaterally. No suspicious perfusion defects.  IMPRESSION: Very low probability examination for pulmonary embolism by modified perfusion only PIOPED criteria (PE absent). Electronically Signed   By: Eddie Candle M.D.   On: 09/09/2020 17:18   DG Chest Port 1 View  Result Date: 09/10/2020 CLINICAL DATA:  Increased shortness of breath EXAM: PORTABLE CHEST 1 VIEW COMPARISON:  Portable exam 0932 hours compared to 09/08/2020 FINDINGS: Enlargement of cardiac silhouette. Atherosclerotic calcification aorta. Mediastinal contours and pulmonary vascularity normal. Decreased lung volumes with bibasilar atelectasis. Upper lungs clear. No pleural effusion or pneumothorax. Advanced LEFT glenohumeral degenerative changes with calcified loose bodies. IMPRESSION: Decreased lung volumes with bibasilar atelectasis. Enlargement of cardiac silhouette. Aortic Atherosclerosis (ICD10-I70.0). Electronically Signed   By: Lavonia Dana M.D.   On: 09/10/2020 10:13   DG Chest Portable 1 View  Result Date: 09/08/2020 CLINICAL DATA:  Anxiety with nausea and cough. EXAM: PORTABLE CHEST 1 VIEW COMPARISON:  April 26, 2019 FINDINGS: Low lung volumes are seen which is likely secondary to the degree of patient inspiration. Mild, stable linear scarring and/or atelectasis is seen within the right lung base. There is no evidence of Nazariah Cadet pleural effusion or pneumothorax. The heart size and mediastinal contours are within normal limits. Mild to moderate severity calcification of the thoracic aorta is seen. Degenerative changes seen involving the left shoulder. IMPRESSION: Mild, stable right basilar linear scarring and/or atelectasis. Electronically Signed   By: Virgina Norfolk M.D.   On: 09/08/2020 23:46   ECHOCARDIOGRAM COMPLETE  Result Date: 09/09/2020    ECHOCARDIOGRAM REPORT   Patient Name:   Jason Hartman Date of Exam: 09/09/2020 Medical Rec #:  [id number]     Height:       64.0 in Accession #:    [id number]    Weight:       178.0 lb Date of Birth:  Dec 13, 1935    BSA:          1.862 m Patient  Age:    17 years      BP:           155/91 mmHg Patient Gender: M  HR:           104 bpm. Exam Location:  Inpatient Procedure: 2D Echo, Cardiac Doppler and Color Doppler Indications:    Other abnormalities of the heart R00.8  History:        Patient has prior history of Echocardiogram examinations, most                 recent 04/27/2019. Risk Factors:Hypertension. Patient has been                 sweating profusely for four months without fever per patient.  Sonographer:    Merrie Roof RDCS Referring Phys: [place]  1. Left ventricular ejection fraction, by estimation, is 55 to 60%. The left ventricle has normal function. The left ventricle has no regional wall motion abnormalities. There is mild left ventricular hypertrophy. Left ventricular diastolic parameters are consistent with Grade I diastolic dysfunction (impaired relaxation).  2. Right ventricular systolic function is normal. The right ventricular size is normal. Tricuspid regurgitation signal is inadequate for assessing PA pressure.  3. Left atrial size was mildly dilated.  4. The mitral valve is grossly normal. Trivial mitral valve regurgitation.  5. The aortic valve is tricuspid. There is mild calcification of the aortic valve. Aortic valve regurgitation is trivial. Mild aortic valve sclerosis is present, with no evidence of aortic valve stenosis. FINDINGS  Left Ventricle: Left ventricular ejection fraction, by estimation, is 55 to 60%. The left ventricle has normal function. The left ventricle has no regional wall motion abnormalities. The left ventricular internal cavity size was normal in size. There is  mild left ventricular hypertrophy. Left ventricular diastolic parameters are consistent with Grade I diastolic dysfunction (impaired relaxation). Right Ventricle: The right ventricular size is normal. No increase in right ventricular wall thickness. Right ventricular systolic function is normal. Tricuspid  regurgitation signal is inadequate for assessing PA pressure. Left Atrium: Left atrial size was mildly dilated. Right Atrium: Right atrial size was normal in size. Pericardium: There is no evidence of pericardial effusion. Mitral Valve: The mitral valve is grossly normal. Mild mitral annular calcification. Trivial mitral valve regurgitation. Tricuspid Valve: The tricuspid valve is grossly normal. Tricuspid valve regurgitation is trivial. Aortic Valve: The aortic valve is tricuspid. There is mild calcification of the aortic valve. There is mild aortic valve annular calcification. Aortic valve regurgitation is trivial. Mild aortic valve sclerosis is present, with no evidence of aortic valve stenosis. Pulmonic Valve: The pulmonic valve was grossly normal. Pulmonic valve regurgitation is trivial. Aorta: The aortic root is normal in size and structure. IAS/Shunts: The interatrial septum appears to be lipomatous. No atrial level shunt detected by color flow Doppler.  LEFT VENTRICLE PLAX 2D LVIDd:         3.30 cm  Diastology LVIDs:         2.80 cm  LV e' medial:    5.44 cm/s LV PW:         1.30 cm  LV E/e' medial:  13.2 LV IVS:        1.10 cm  LV e' lateral:   8.05 cm/s LVOT diam:     2.00 cm  LV E/e' lateral: 8.9 LV SV:         46 LV SV Index:   25 LVOT Area:     3.14 cm  LEFT ATRIUM             Index LA diam:  4.00 cm 2.15 cm/m LA Vol (A2C):   59.8 ml 32.12 ml/m LA Vol (A4C):   65.6 ml 35.23 ml/m LA Biplane Vol: 65.1 ml 34.97 ml/m  AORTIC VALVE LVOT Vmax:   102.00 cm/s LVOT Vmean:  73.500 cm/s LVOT VTI:    0.146 m  AORTA Ao Root diam: 3.60 cm MITRAL VALVE MV Area (PHT): 3.53 cm     SHUNTS MV Decel Time: 215 msec     Systemic VTI:  0.15 m MV E velocity: 72.00 cm/s   Systemic Diam: 2.00 cm MV Zenora Karpel velocity: 116.00 cm/s MV E/Ruchama Kubicek ratio:  0.62 Rozann Lesches MD Electronically signed by Rozann Lesches MD Signature Date/Time: 09/09/2020/1:20:33 PM    Final    VAS [place] LOWER EXTREMITY VENOUS (DVT)  Result Date:  09/09/2020  Lower Venous DVT Study Patient Name:  Jason Hartman  Date of Exam:   09/09/2020 Medical Rec #: [id number]      Accession #:    [id number] Date of Birth: 1935/09/03     Patient Gender: M Patient Age:   84Y Exam Location:  [facility] Procedure:      VAS [place] LOWER EXTREMITY VENOUS (DVT) Referring Phys: [id number] Rene Sizelove CALDWELL POWELL JR --------------------------------------------------------------------------------  Indications: Elevated Ddimer.  Risk Factors: None identified. Limitations: Poor ultrasound/tissue interface. Comparison Study: No prior studies. Performing Technologist: Oliver Hum RVT  Examination Guidelines: Shalona Harbour complete evaluation includes B-mode imaging, spectral Doppler, color Doppler, and power Doppler as needed of all accessible portions of each vessel. Bilateral testing is considered an integral part of Adryan Shin complete examination. Limited examinations for reoccurring indications may be performed as noted. The reflux portion of the exam is performed with the patient in reverse Trendelenburg.  +---------+---------------+---------+-----------+----------+--------------+ RIGHT    CompressibilityPhasicitySpontaneityPropertiesThrombus Aging +---------+---------------+---------+-----------+----------+--------------+ CFV      Full           Yes      Yes                                 +---------+---------------+---------+-----------+----------+--------------+ SFJ      Full                                                        +---------+---------------+---------+-----------+----------+--------------+ FV Prox  Full                                                        +---------+---------------+---------+-----------+----------+--------------+ FV Mid   Full                                                        +---------+---------------+---------+-----------+----------+--------------+ FV DistalFull                                                         +---------+---------------+---------+-----------+----------+--------------+  PFV      Full                                                        +---------+---------------+---------+-----------+----------+--------------+ POP      Full           Yes      Yes                                 +---------+---------------+---------+-----------+----------+--------------+ PTV      Full                                                        +---------+---------------+---------+-----------+----------+--------------+ PERO     Full                                                        +---------+---------------+---------+-----------+----------+--------------+   +---------+---------------+---------+-----------+----------+--------------+ LEFT     CompressibilityPhasicitySpontaneityPropertiesThrombus Aging +---------+---------------+---------+-----------+----------+--------------+ CFV      Full           Yes      Yes                                 +---------+---------------+---------+-----------+----------+--------------+ SFJ      Full                                                        +---------+---------------+---------+-----------+----------+--------------+ FV Prox  Full                                                        +---------+---------------+---------+-----------+----------+--------------+ FV Mid   Full                                                        +---------+---------------+---------+-----------+----------+--------------+ FV DistalFull                                                        +---------+---------------+---------+-----------+----------+--------------+ PFV      Full                                                        +---------+---------------+---------+-----------+----------+--------------+  POP      Full           Yes      Yes                                  +---------+---------------+---------+-----------+----------+--------------+ PTV      Full                                                        +---------+---------------+---------+-----------+----------+--------------+ PERO     Full                                                        +---------+---------------+---------+-----------+----------+--------------+    Summary: RIGHT: - There is no evidence of deep vein thrombosis in the lower extremity.  - No cystic structure found in the popliteal fossa.  LEFT: - There is no evidence of deep vein thrombosis in the lower extremity.  - No cystic structure found in the popliteal fossa.  *See table(s) above for measurements and observations.    Preliminary    [place] EKG SITE RITE  Result Date: 09/10/2020 If Site Rite image not attached, placement could not be confirmed due to current cardiac rhythm.  US Abdomen Limited RUQ (LIVER/GB)  Result Date: 09/09/2020 CLINICAL DATA:  Increased bilirubin. EXAM: ULTRASOUND ABDOMEN LIMITED RIGHT UPPER QUADRANT COMPARISON:  CT 02/19/2020 FINDINGS: Gallbladder: No gallstones or wall thickening visualized. No sonographic Murphy sign noted by sonographer. Common bile duct: Diameter: Normal at 2 mm Liver: No focal lesion identified. Within normal limits in parenchymal echogenicity. Portal vein is patent on color Doppler imaging with normal direction of blood flow towards the liver. Other: None. IMPRESSION: 1. Normal gallbladder and common bile duct. 2. No biliary duct dilatation. Electronically Signed   By: Suzy Bouchard M.D.   On: 09/09/2020 15:56        Scheduled Meds: . Chlorhexidine Gluconate Cloth  6 each Topical Daily  . diazepam      . diazepam  5 mg Intravenous Q4H  . heparin  5,000 Units Subcutaneous Q8H  .  HYDROmorphone (DILAUDID) injection  0.5 mg Intravenous Q4H  . insulin aspart  0-9 Units Subcutaneous Q4H  . mouth rinse  15 mL Mouth Rinse BID   Continuous Infusions: . sodium chloride     . sodium chloride    . dextrose 50 mL/hr at 09/10/20 1000  . lactated ringers    . lactated ringers 100 mL/hr at 09/09/20 1754  . norepinephrine (LEVOPHED) Adult infusion 5 mcg/min (09/10/20 1000)     LOS: 0 days    Time spent: over 30 min 35 min critical care with acute metabolic encephalopathy    Fayrene Helper, MD [facility]   To contact the attending provider between 7A-7P or the covering provider during after hours 7P-7A, please log into the web site [email or web address] and access using universal [place] password for that web site. If you do not have the password, please call the hospital operator.  09/10/2020, 10:33 AM

## 2020-09-10 NOTE — Progress Notes (Signed)
NP saw pt at bedside.  Very agitated, yelling out and thrashing in bed, despite bilateral wrist restraints.  Added bilateral ankle restraints.  No notable response from 4mg  Ativan and 12 mg Haldol.  Precedex drip initiated and maxed  Out.  PCCM consult with MD at bedside.

## 2020-09-10 NOTE — Progress Notes (Signed)
Pt transferred from 1616 to 1234 in the ICU at approximately 0315. NP Lang Snow came bedside after secure chatting with me, his care nurse, due to my ongoing concerns about his severe anxiety/agitation with minimal response to PRNs, two hypoglycemic episodes,increased urination frequency, several loose stools and profuse diaphoresis. Bilateral wrist restraints placed just before 0200 for safety, and pulling at lines, patient became increasingly weak attempting to stand at EOB and not following commands when he was earlier in the shift. When restraints placed I was able to hook tele monitor back up without pt removing, Mr Jilek was tachycardic to the 150s sustained and more agitated, when M. Sharlet Salina arrived bedside she asked that 2L [place] be placed due to tachycardia, after speaking with me and assessing the patient she informed me that Genele (sp?) the rapid response RN would be coming bedside and we would possibly be transferring Mr Cerami to the ICU. After asking M. Sharlet Salina  if I needed to contact family about the transfer and the restraint use she informed me that the receiving staff in the ICU would do so. 24 hr urine collection, pt glasses and other pt belongings all accompanied pt to new room at time of transfer.

## 2020-09-10 NOTE — Consult Note (Addendum)
Cardiology Consultation:   Patient ID: Jason Hartman MRN: [id number]; DOB: 07/23/1935  Admit date: 09/08/2020 Date of Consult: 09/10/2020  PCP:  Jason Manes, MD   [facility] Providers Cardiologist:  None        Patient Profile:   Jason Hartman is a 85 y.o. male with a hx of OSA, Parkinson's disease, CKD, IDDM who is being seen 09/10/2020 for the evaluation of atrial flutter at the request of Dr. Florene Glen  History of Present Illness:   [name] is an 85 year old male with a history of OSA, Parkinson's disease, CKD, IDDM who we are consulted to see for evaluation of atrial flutter and troponin elevation.  He has a history of anxiety and was recently told to stop taking Xanax.  He presented to the ED on 6/3 with worsening anxiety and diaphoresis.  In the ED, noted to be normotensive, tachycardic to 120, SPO2 mid 90s on room air.  Initial EKG showed sinus tachycardia with PVCs.  Chest x-ray showed mild stable right basilar scar atelectasis.  Labs notable for creatinine 1.3, WBC 15.7, hemoglobin 19, troponin 28 > 32.  There was concern for serotonin syndrome and tramadol, BuSpar, and Cymbalta were held.  Echocardiogram on 6/4 showed LVEF 55 to 60%, no wall motion abnormalities.  This morning he was very agitated, required Precedex drip as well as Haldol and Ativan.  Also noted to be in atrial flutter with rates up to 160s.  Has since converted to sinus rhythm.  He is currently agitated, unable to follow commands or answer questions.   Past Medical History:  Diagnosis Date  . Cellulitis and abscess of leg, except foot   . Hypertension   . Obstructive sleep apnea (adult) (pediatric)   . Pain in joint, pelvic region and thigh   . Pneumonia, organism unspecified(486)   . Restless legs syndrome (RLS)   . RLS (restless legs syndrome) 09/01/2012  . Thoracic or lumbosacral neuritis or radiculitis, unspecified   . Type II or unspecified type diabetes mellitus without mention of complication, not  stated as uncontrolled   . Unspecified disease of pericardium   . Unspecified hereditary and idiopathic peripheral neuropathy     Past Surgical History:  Procedure Laterality Date  . ANAL FISSURE REPAIR    . pericarditis  2009      Inpatient Medications: Scheduled Meds: . [START ON 09/11/2020] Chlorhexidine Gluconate Cloth  6 each Topical Q0600  . diazepam      . diazepam  5 mg Intravenous Q4H  . heparin  5,000 Units Subcutaneous Q8H  .  HYDROmorphone (DILAUDID) injection  0.5 mg Intravenous Q4H  . insulin aspart  0-9 Units Subcutaneous Q4H  . mouth rinse  15 mL Mouth Rinse BID  . mupirocin ointment  1 application Nasal BID  . nitroGLYCERIN  1 inch Topical Q8H  . thiamine injection  100 mg Intravenous Daily   Continuous Infusions: . sodium chloride    . sodium chloride    . dextrose 50 mL/hr at 09/10/20 1200  . lactated ringers 100 mL/hr at 09/09/20 1754  . norepinephrine (LEVOPHED) Adult infusion Stopped (09/10/20 1011)   PRN Meds: Place/Maintain arterial line **AND** sodium chloride, acetaminophen **OR** acetaminophen, diazepam, naLOXone (NARCAN)  injection, ofloxacin, ondansetron **OR** ondansetron (ZOFRAN) IV, senna-docusate, technetium albumin aggregated  Allergies:    Allergies  Allergen Reactions  . Oxycodone Anaphylaxis    Other reaction(s): Other (See Comments) Cannot recall specifics  . Iodinated Diagnostic Agents Hives    alleric to renografin,isovue &  omnipaque, hives, requires 13 hr prep//a.calhoun, Onset Date: [id number]     . Iodine Hives and Rash    alleric to renografin,isovue & omnipaque, hives, requires 13 hr prep//a.calhoun, Onset Date: [id number] allergic to renografin,isovue & omnipaque, hives, requires 13 hr prep//a.calhoun, Onset Date: [id number]  . Linezolid Hives and Rash       . Methadone Hcl Other (See Comments)    HALLUCINATIONS   . Promethazine Other (See Comments)    Mental status change, DELIRIUM   Other reaction(s): erratic  behavior Other reaction(s): Other (See Comments) Mental status change DELIRIUM (pulled out IV)  . Morphine Sulfate Other (See Comments)    Pt not sure about allergy   . Other Hives    Other reaction(s): erratic behanvior Other reaction(s): erratic behavior  . Oxycodone Hcl Other (See Comments)    Pt not sure about allergy   . Penicillins Other (See Comments)    Pt not sure about allergy   . Quetiapine Other (See Comments)    Pt not sure about allergy  Other reaction(s): Other (See Comments) Pt not sure about allergy  Pt not sure about allergy   . Statins     Other reaction(s): muscle weakness Other reaction(s): muscle weakness  . Zolpidem Other (See Comments)    Other reaction(s): erratic behanvior Other reaction(s): Delusions (intolerance) Altered mental status  . Atorvastatin Itching and Other (See Comments)    Tired, weakness   . Carbidopa-Levodopa Anxiety  . Clindamycin Rash  . Colesevelam Other (See Comments)    tired   . Doxazosin Rash  . Lovastatin Other (See Comments)    Tired, nervousness   . Rosuvastatin Other (See Comments)    Tired, weakness     Social History:   Social History   Socioeconomic History  . Marital status: Married    Spouse name: Jason Hartman  . Number of children: 2  . Years of education: College  . Highest education level: Not on file  Occupational History  . Occupation: Marine scientist: [employer]  Tobacco Use  . Smoking status: Former Smoker    Quit date: 09/01/1980    Years since quitting: 40.0  . Smokeless tobacco: Never Used  Vaping Use  . Vaping Use: Never used  Substance and Sexual Activity  . Alcohol use: No  . Drug use: No  . Sexual activity: Not on file  Other Topics Concern  . Not on file  Social History Narrative   Patient lives at home with spouse.   Caffeine Use: occasionally   Social Determinants of Health   Financial Resource Strain: Not on file  Food Insecurity: Not on file  Transportation  Needs: Not on file  Physical Activity: Not on file  Stress: Not on file  Social Connections: Not on file  Intimate Partner Violence: Not on file    Family History:    Family History  Problem Relation Age of Onset  . Diabetes Father      ROS:  Please see the history of present illness.   Unable to assess given encephalopathy  Physical Exam/Data:   Vitals:   09/10/20 1018 09/10/20 1030 09/10/20 1100 09/10/20 1200  BP:      Pulse: 84 81 92 87  Resp: 10 (!) 23 16 12   Temp:    98 F (36.7 C)  TempSrc:    Axillary  SpO2: 97% 99% 98% 99%  Weight:      Height:        Intake/Output Summary (Last  24 hours) at 09/10/2020 1412 Last data filed at 09/10/2020 1200 Gross per 24 hour  Intake 3541.34 ml  Output 950 ml  Net 2591.34 ml   Last 3 Weights 09/08/2020 02/19/2020 04/26/2019  Weight (lbs) 178 lb 200 lb 214 lb  Weight (kg) 80.74 kg 90.719 kg 97.07 kg     Body mass index is 30.55 kg/m.  General:  Agitated, confused Neck: no JVD appreciated Cardiac:  normal S1, S2; tachycardic, regular rate, no murmurs Lungs:  clear to auscultation bilaterally, no wheezing, rhonchi or rales  Abd: soft, nontender Ext: no edema Musculoskeletal:  No deformities Skin: warm and dry  Neuro:  Confused, agitated Psych:  Unable to assess  EKG:  The EKG was personally reviewed and demonstrates: Normal sinus rhythm, rate 107, PAC, LVH, QTC 456 Telemetry:  Telemetry was personally reviewed and demonstrates: Was in 2-1 a flutter in the 160s and converted to 4-1 atrial flutter in the 80s, then converted to normal sinus rhythm with rates 90s to 120s  Relevant CV Studies:   Laboratory Data:  High Sensitivity Troponin:   Recent Labs  Lab 09/08/20 2308 09/09/20 0400 09/10/20 1108 09/10/20 1242  TROPONINIHS 28* 32* 388* 300*     Chemistry Recent Labs  Lab 09/08/20 2339 09/10/20 0926  NA 134* 143  K 4.3 4.2  CL 98 108  CO2  --  24  GLUCOSE 308* 74  BUN 39* 41*  CREATININE 1.30* 2.20*   CALCIUM  --  9.3  GFRNONAA  --  29*  ANIONGAP  --  11    Recent Labs  Lab 09/08/20 2308 09/10/20 0926  PROT 7.6 5.8*  ALBUMIN 4.3 3.4*  AST 27 64*  ALT 32 37  ALKPHOS 65 46  BILITOT 2.7* 1.8*   Hematology Recent Labs  Lab 09/08/20 2308 09/08/20 2339 09/10/20 0926  WBC 15.7*  --  20.0*  RBC 6.71*  --  6.16*  HGB 19.0* 20.1* 17.6*  HCT 58.5* 59.0* 54.7*  MCV 87.2  --  88.8  MCH 28.3  --  28.6  MCHC 32.5  --  32.2  RDW 14.6  --  14.3  PLT 200  --  210   BNP Recent Labs  Lab 09/10/20 1108  BNP 95.6    DDimer  Recent Labs  Lab 09/09/20 0830  DDIMER 1.87*     Radiology/Studies:  NM Pulmonary Perfusion  Result Date: 09/09/2020 CLINICAL DATA:  Elevated D-dimer, rule out PE EXAM: NUCLEAR MEDICINE PERFUSION LUNG SCAN TECHNIQUE: Perfusion images were obtained in multiple projections after intravenous injection of radiopharmaceutical. Ventilation scans intentionally deferred if perfusion scan and chest x-ray adequate for interpretation during COVID 19 epidemic. RADIOPHARMACEUTICALS:  4.1 mCi Tc-69m MAA IV COMPARISON:  Chest radiograph, 09/08/2020 FINDINGS: Normal, homogeneous perfusion of the lungs bilaterally. No suspicious perfusion defects. IMPRESSION: Very low probability examination for pulmonary embolism by modified perfusion only PIOPED criteria (PE absent). Electronically Signed   By: Eddie Candle M.D.   On: 09/09/2020 17:18   DG Chest Port 1 View  Result Date: 09/10/2020 CLINICAL DATA:  Increased shortness of breath EXAM: PORTABLE CHEST 1 VIEW COMPARISON:  Portable exam 0932 hours compared to 09/08/2020 FINDINGS: Enlargement of cardiac silhouette. Atherosclerotic calcification aorta. Mediastinal contours and pulmonary vascularity normal. Decreased lung volumes with bibasilar atelectasis. Upper lungs clear. No pleural effusion or pneumothorax. Advanced LEFT glenohumeral degenerative changes with calcified loose bodies. IMPRESSION: Decreased lung volumes with bibasilar  atelectasis. Enlargement of cardiac silhouette. Aortic Atherosclerosis (ICD10-I70.0). Electronically Signed   By:  Lavonia Dana M.D.   On: 09/10/2020 10:13   DG Chest Portable 1 View  Result Date: 09/08/2020 CLINICAL DATA:  Anxiety with nausea and cough. EXAM: PORTABLE CHEST 1 VIEW COMPARISON:  April 26, 2019 FINDINGS: Low lung volumes are seen which is likely secondary to the degree of patient inspiration. Mild, stable linear scarring and/or atelectasis is seen within the right lung base. There is no evidence of a pleural effusion or pneumothorax. The heart size and mediastinal contours are within normal limits. Mild to moderate severity calcification of the thoracic aorta is seen. Degenerative changes seen involving the left shoulder. IMPRESSION: Mild, stable right basilar linear scarring and/or atelectasis. Electronically Signed   By: Virgina Norfolk M.D.   On: 09/08/2020 23:46   ECHOCARDIOGRAM COMPLETE  Result Date: 09/09/2020    ECHOCARDIOGRAM REPORT   Patient Name:   JAKALEB PAYER Date of Exam: 09/09/2020 Medical Rec #:  [id number]     Height:       64.0 in Accession #:    [id number]    Weight:       178.0 lb Date of Birth:  [birth year]    BSA:          1.862 m Patient Age:    23 years      BP:           155/91 mmHg Patient Gender: M             HR:           104 bpm. Exam Location:  Inpatient Procedure: 2D Echo, Cardiac Doppler and Color Doppler Indications:    Other abnormalities of the heart R00.8  History:        Patient has prior history of Echocardiogram examinations, most                 recent 04/27/2019. Risk Factors:Hypertension. Patient has been                 sweating profusely for four months without fever per patient.  Sonographer:    Merrie Roof RDCS Referring Phys: Bode  1. Left ventricular ejection fraction, by estimation, is 55 to 60%. The left ventricle has normal function. The left ventricle has no regional wall motion abnormalities. There is mild  left ventricular hypertrophy. Left ventricular diastolic parameters are consistent with Grade I diastolic dysfunction (impaired relaxation).  2. Right ventricular systolic function is normal. The right ventricular size is normal. Tricuspid regurgitation signal is inadequate for assessing PA pressure.  3. Left atrial size was mildly dilated.  4. The mitral valve is grossly normal. Trivial mitral valve regurgitation.  5. The aortic valve is tricuspid. There is mild calcification of the aortic valve. Aortic valve regurgitation is trivial. Mild aortic valve sclerosis is present, with no evidence of aortic valve stenosis. FINDINGS  Left Ventricle: Left ventricular ejection fraction, by estimation, is 55 to 60%. The left ventricle has normal function. The left ventricle has no regional wall motion abnormalities. The left ventricular internal cavity size was normal in size. There is  mild left ventricular hypertrophy. Left ventricular diastolic parameters are consistent with Grade I diastolic dysfunction (impaired relaxation). Right Ventricle: The right ventricular size is normal. No increase in right ventricular wall thickness. Right ventricular systolic function is normal. Tricuspid regurgitation signal is inadequate for assessing PA pressure. Left Atrium: Left atrial size was mildly dilated. Right Atrium: Right atrial size was normal in size. Pericardium: There is no evidence  of pericardial effusion. Mitral Valve: The mitral valve is grossly normal. Mild mitral annular calcification. Trivial mitral valve regurgitation. Tricuspid Valve: The tricuspid valve is grossly normal. Tricuspid valve regurgitation is trivial. Aortic Valve: The aortic valve is tricuspid. There is mild calcification of the aortic valve. There is mild aortic valve annular calcification. Aortic valve regurgitation is trivial. Mild aortic valve sclerosis is present, with no evidence of aortic valve stenosis. Pulmonic Valve: The pulmonic valve was  grossly normal. Pulmonic valve regurgitation is trivial. Aorta: The aortic root is normal in size and structure. IAS/Shunts: The interatrial septum appears to be lipomatous. No atrial level shunt detected by color flow Doppler.  LEFT VENTRICLE PLAX 2D LVIDd:         3.30 cm  Diastology LVIDs:         2.80 cm  LV e' medial:    5.44 cm/s LV PW:         1.30 cm  LV E/e' medial:  13.2 LV IVS:        1.10 cm  LV e' lateral:   8.05 cm/s LVOT diam:     2.00 cm  LV E/e' lateral: 8.9 LV SV:         46 LV SV Index:   25 LVOT Area:     3.14 cm  LEFT ATRIUM             Index LA diam:        4.00 cm 2.15 cm/m LA Vol (A2C):   59.8 ml 32.12 ml/m LA Vol (A4C):   65.6 ml 35.23 ml/m LA Biplane Vol: 65.1 ml 34.97 ml/m  AORTIC VALVE LVOT Vmax:   102.00 cm/s LVOT Vmean:  73.500 cm/s LVOT VTI:    0.146 m  AORTA Ao Root diam: 3.60 cm MITRAL VALVE MV Area (PHT): 3.53 cm     SHUNTS MV Decel Time: 215 msec     Systemic VTI:  0.15 m MV E velocity: 72.00 cm/s   Systemic Diam: 2.00 cm MV A velocity: 116.00 cm/s MV E/A ratio:  0.62 Rozann Lesches MD Electronically signed by Rozann Lesches MD Signature Date/Time: 09/09/2020/1:20:33 PM    Final    VAS [place] LOWER EXTREMITY VENOUS (DVT)  Result Date: 09/09/2020  Lower Venous DVT Study Patient Name:  HRIDAY STAI  Date of Exam:   09/09/2020 Medical Rec #: [id number]      Accession #:    [id number] Date of Birth: 1936/04/02     Patient Gender: M Patient Age:   84Y Exam Location:  [facility] Procedure:      VAS [place] LOWER EXTREMITY VENOUS (DVT) Referring Phys: [id number] CALDWELL POWELL JR --------------------------------------------------------------------------------  Indications: Elevated Ddimer.  Risk Factors: None identified. Limitations: Poor ultrasound/tissue interface. Comparison Study: No prior studies. Performing Technologist: Oliver Hum RVT  Examination Guidelines: A complete evaluation includes B-mode imaging, spectral Doppler, color Doppler, and power Doppler as needed of  all accessible portions of each vessel. Bilateral testing is considered an integral part of a complete examination. Limited examinations for reoccurring indications may be performed as noted. The reflux portion of the exam is performed with the patient in reverse Trendelenburg.  +---------+---------------+---------+-----------+----------+--------------+ RIGHT    CompressibilityPhasicitySpontaneityPropertiesThrombus Aging +---------+---------------+---------+-----------+----------+--------------+ CFV      Full           Yes      Yes                                 +---------+---------------+---------+-----------+----------+--------------+  SFJ      Full                                                        +---------+---------------+---------+-----------+----------+--------------+ FV Prox  Full                                                        +---------+---------------+---------+-----------+----------+--------------+ FV Mid   Full                                                        +---------+---------------+---------+-----------+----------+--------------+ FV DistalFull                                                        +---------+---------------+---------+-----------+----------+--------------+ PFV      Full                                                        +---------+---------------+---------+-----------+----------+--------------+ POP      Full           Yes      Yes                                 +---------+---------------+---------+-----------+----------+--------------+ PTV      Full                                                        +---------+---------------+---------+-----------+----------+--------------+ PERO     Full                                                        +---------+---------------+---------+-----------+----------+--------------+    +---------+---------------+---------+-----------+----------+--------------+ LEFT     CompressibilityPhasicitySpontaneityPropertiesThrombus Aging +---------+---------------+---------+-----------+----------+--------------+ CFV      Full           Yes      Yes                                 +---------+---------------+---------+-----------+----------+--------------+ SFJ      Full                                                        +---------+---------------+---------+-----------+----------+--------------+  FV Prox  Full                                                        +---------+---------------+---------+-----------+----------+--------------+ FV Mid   Full                                                        +---------+---------------+---------+-----------+----------+--------------+ FV DistalFull                                                        +---------+---------------+---------+-----------+----------+--------------+ PFV      Full                                                        +---------+---------------+---------+-----------+----------+--------------+ POP      Full           Yes      Yes                                 +---------+---------------+---------+-----------+----------+--------------+ PTV      Full                                                        +---------+---------------+---------+-----------+----------+--------------+ PERO     Full                                                        +---------+---------------+---------+-----------+----------+--------------+    Summary: RIGHT: - There is no evidence of deep vein thrombosis in the lower extremity.  - No cystic structure found in the popliteal fossa.  LEFT: - There is no evidence of deep vein thrombosis in the lower extremity.  - No cystic structure found in the popliteal fossa.  *See table(s) above for measurements and observations.    Preliminary    [place]  EKG SITE RITE  Result Date: 09/10/2020 If Site Rite image not attached, placement could not be confirmed due to current cardiac rhythm.  US Abdomen Limited RUQ (LIVER/GB)  Result Date: 09/09/2020 CLINICAL DATA:  Increased bilirubin. EXAM: ULTRASOUND ABDOMEN LIMITED RIGHT UPPER QUADRANT COMPARISON:  CT 02/19/2020 FINDINGS: Gallbladder: No gallstones or wall thickening visualized. No sonographic Murphy sign noted by sonographer. Common bile duct: Diameter: Normal at 2 mm Liver: No focal lesion identified. Within normal limits in parenchymal echogenicity. Portal vein is patent on color Doppler imaging with normal direction of blood flow towards the liver. Other: None. IMPRESSION: 1. Normal gallbladder  and common bile duct. 2. No biliary duct dilatation. Electronically Signed   By: Suzy Bouchard M.D.   On: 09/09/2020 15:56     Assessment and Plan:   Atrial flutter: Was in aflutter with 2 1 conduction, rate 160s earlier today.  Improved to 4:1 conduction with rate in the 80s and has since converted back to sinus rhythm.  CHA2DS2-VASc score 4 (hypertension, age x2, diabetes) -Recommend starting anticoagulation.  Head CT is planned, would recommend starting heparin drip if head CT unremarkable -Rates appeared controlled in 80s when agitation was controlled and has now converted to sinus. If having further issues with Afib/flutter with RVR would recommend starting amiodarone to maintain sinus rhythm  Troponin elevation: Troponin 388 > 300.  Suspect demand ischemia in setting of acute illness and tachyarrhythmia as above.  Echocardiogram yesterday showed normal LVEF 55 to 60%, no wall motion abnormalities, grade 1 diastolic dysfunction, normal RV function, no significant valvular disease.  No ischemic evaluation recommended at this time.   For questions or updates, please contact [place] Please consult [email or web address] for contact info under    Signed, Donato Heinz, MD  09/10/2020 2:12  PM

## 2020-09-10 NOTE — Progress Notes (Signed)
Hypoglycemic Event  CBG: 56 at  0110  Treatment: Orange juice and Lemon lime soda per hypoglycemia algorithm   Symptoms: ongoing diaphoresis, increased confusion, may be unrelated   Follow-up CBG:97  Time:0142  CBG Result: 76 at 0248  Possible Reasons for Event: Uncertain, U98 infusing at 57ml/hr as ordered  Comments/MD notified:M. Sharlet Salina NP     Jason Hartman

## 2020-09-10 NOTE — Consult Note (Addendum)
NAME:  Jason Hartman, MRN:  [id number], DOB:  [birth year], LOS: 0 ADMISSION DATE:  09/08/2020, CONSULTATION DATE:  09/10/20 REFERRING MD:  Florene Glen, CHIEF COMPLAINT:  anxiety   History of Present Illness:  Jason Hartman is a 85 y.o. male with medical history significant for OSA, Parkinson's, restless leg syndrome, anxiety, chronic pain, chronic kidney disease, and insulin-dependent diabetes mellitus, now presenting to the emergency department for evaluation of anxiety and diaphoresis. He was admitted for further workup of panic, SIRS of unclear etiology on whom we were consulted for worsening agitation, hyperactive delirium failing to respond to precedex, IV haldol.   Hospital course: Upon arrival to the ED, patient was found to be afebrile, saturating mid 90s on room air, tachycardic to 120, and with stable blood pressure. EKG features sinus tachycardia with PVCs. Chest x-ray with mild stable right basilar scar or atelectasis. Chemistry panel notable for glucose 308, bilirubin 2.7, and creatinine 1.30. CBC features a leukocytosis to 15,700 and polycythemia with hemoglobin 19.0. High-sensitivity troponin was 28 and 32. Urinalysis with glucosuria, ketonuria, microscopic hematuria, and proteinuria. Blood cultures were collected in the emergency department and the patient was given IV fluids and Azactam. On admission, ABX were held without clear evidence of underlying infectious process, and he was given prn ativan and haldol for agitation intermittently.  This morning his agitation acutely worsened despite trial of 4mg  ativan, 12mg  haldol and precedex.  Pertinent  Medical History  Parkinson's disease OSA RLS IDDM  Significant Hospital Events: Including procedures, antibiotic start and stop dates in addition to other pertinent events   .   Interim History / Subjective:  Patient admitted overnight to critical care floor for increased agitation, tachycardia and diaphoresis  Objective   Blood  pressure (!) 159/65, pulse (!) 139, temperature (!) 97.5 F (36.4 C), temperature source Oral, resp. rate (!) 23, height 5\' 4"  (1.626 m), weight 80.7 kg, SpO2 100 %.        Intake/Output Summary (Last 24 hours) at 09/10/2020 0622 Last data filed at 09/10/2020 0545 Gross per 24 hour  Intake 1427.26 ml  Output 950 ml  Net 477.26 ml   Filed Weights   09/08/20 2205  Weight: 80.7 kg    Examination: General: Patient moans to voice does not folow commands. Clearly agitated HENT: Dry mucus membranes Lungs: Clear with no crackles noted Cardiovascular: Tachycardic but regular. No murmurs noted Abdomen: Soft non distended Extremities: Not rigid or hyperreflexic Neuro: No focal deficits appreciated. No clonus or rigidity noted. No hyperreflexia appreciated GU: Deferred  Labs/imaging that I havepersonally reviewed  (right click and "Reselect all SmartList Selections" daily)   6/3 CXR clear   6/4 abdominal US unremarkable  6/4 V/Q scan low probability PE  6/4 [place] BLE DVT negative  Resolved Hospital Problem list   n/a  Assessment & Plan:   # Acute encephalopathy: Today's acute deterioration may reflect developing NMS in setting of haldol administration/underlying parkinson's although he hasn't been febrile vs serotonin syndrome (recent starts of buspar, cymbalta, tramadol) vs benzo or alcohol withdrawal although timing doesn't seem to fit reported history of benzo prescribing or cessation and he reports no etoh use vs dysautonomia/multi-system atrophy (although acuity doesn't quite fit). Also patient with prominent diarrhea on floor so opiate withdrawal is also on differential. Patients heart rate improved dramatically with 0.5 mg of dilaudid pointing towards opiate withdrawal. Sweating much worse with Cymbalta per wife would avoid restarting at discharge.  - repeat CK, free t4, CMP, cortisol, b12, RPR -  continue precedex - would favor use of scheduled benzos (5 mg Q 4 of valium) for  agitation in this setting and avoid typical antipsychotics -Also schedule dilaudid 0.5 mg every 4 hours. Can increase as needed -Hopefully will calm down and would work on enteral access to give oral opiates -When enteral access attained start gabapentin back   # Tachycardia-with anxiety - f/u EKG -Improved singificnatly after 0.5 mg of dialudid - f/u response to mgmt of acute agitation as above -Attain BCX and UCX. In case this is possible Sepsis but seems unlikely. If remains tachycardic consider antibiotics  # Parkinson's disease: - Hold neuropro for now. Had two patches on on the floor and thus could have dopamine overdrive -Need to restart on 09/11/2020 AM pending patient clinical course -Stopped taking neuropro a month ago and started taking gabapentin instead.  -Start gabapentin when eneterall access when   # Hypoglycemia: poor oral intake - holding insulin - continue d10, q4 glucose checks   # CKD: Avoid nephrotoxins as able   # IDDM: - q4 checks - hold Lantus, and mealtime -Will need D10 infusion  OSA-Patient non compliant with CPAP -Patient at risk for obstructing as he comes more sedated. Need HOB at 30 degrees and than can consider nasal trumpet        Best practice (right click and "Reselect all SmartList Selections" daily)  Diet:  NPO Pain/Anxiety/Delirium protocol (if indicated):Valium and dilaudid scheduled VAP protocol (if indicated): Not indicated DVT prophylaxis: Subcutaneous Heparin GI prophylaxis: N/A Glucose control:  FSBS Q4 hours Central venous access:  N/A Arterial line:  N/A Foley:  N/A Mobility:  bed rest  PT consulted: N/A Last date of multidisciplinary goals of care discussion [09/10/20] talked with wife and she is aware of plan Code Status:  full code Disposition: ICU  Labs   CBC: Recent Labs  Lab 09/08/20 2308 09/08/20 2339  WBC 15.7*  --   NEUTROABS 14.0*  --   HGB 19.0* 20.1*  HCT 58.5* 59.0*  MCV 87.2  --   PLT 200  --      Basic Metabolic Panel: Recent Labs  Lab 09/08/20 2339  NA 134*  K 4.3  CL 98  GLUCOSE 308*  BUN 39*  CREATININE 1.30*   GFR: Estimated Creatinine Clearance: 42 mL/min (A) (by C-G formula based on SCr of 1.3 mg/dL (H)). Recent Labs  Lab 09/08/20 2308  WBC 15.7*    Liver Function Tests: Recent Labs  Lab 09/08/20 2308  AST 27  ALT 32  ALKPHOS 65  BILITOT 2.7*  PROT 7.6  ALBUMIN 4.3   No results for input(s): LIPASE, AMYLASE in the last 168 hours. No results for input(s): AMMONIA in the last 168 hours.  ABG    Component Value Date/Time   TCO2 20 (L) 09/08/2020 2339     Coagulation Profile: No results for input(s): INR, PROTIME in the last 168 hours.  Cardiac Enzymes: Recent Labs  Lab 09/09/20 0635  CKTOTAL 263    HbA1C: Hgb A1c MFr Bld  Date/Time Value Ref Range Status  04/26/2019 06:57 PM 9.1 (H) 4.8 - 5.6 % Final    Comment:    (NOTE) Pre diabetes:          5.7%-6.4% Diabetes:              >6.4% Glycemic control for   <7.0% adults with diabetes   09/18/2006 07:58 AM 7.0 (H) 4.6 - 6.0 % Final    Comment:    See  lab report for associated comment(s)    CBG: Recent Labs  Lab 09/10/20 0110 09/10/20 0142 09/10/20 0248 09/10/20 0413 09/10/20 0431  GLUCAP 56* 97 76 59* 99    Review of Systems:   Not able to attain given patients status  Past Medical History:  He,  has a past medical history of Cellulitis and abscess of leg, except foot, Hypertension, Obstructive sleep apnea (adult) (pediatric), Pain in joint, pelvic region and thigh, Pneumonia, organism unspecified(486), Restless legs syndrome (RLS), RLS (restless legs syndrome) (09/01/2012), Thoracic or lumbosacral neuritis or radiculitis, unspecified, Type II or unspecified type diabetes mellitus without mention of complication, not stated as uncontrolled, Unspecified disease of pericardium, and Unspecified hereditary and idiopathic peripheral neuropathy.   Surgical History:   Past  Surgical History:  Procedure Laterality Date  . ANAL FISSURE REPAIR    . pericarditis  2009     Social History:   reports that he quit smoking about 40 years ago. He has never used smokeless tobacco. He reports that he does not drink alcohol and does not use drugs.   Family History:  His family history includes Diabetes in his father.   Allergies Allergies  Allergen Reactions  . Oxycodone Anaphylaxis    Other reaction(s): Other (See Comments) Cannot recall specifics  . Iodinated Diagnostic Agents Hives    alleric to renografin,isovue & omnipaque, hives, requires 13 hr prep//a.calhoun, Onset Date: [id number]     . Iodine Hives and Rash    alleric to renografin,isovue & omnipaque, hives, requires 13 hr prep//a.calhoun, Onset Date: [id number] allergic to renografin,isovue & omnipaque, hives, requires 13 hr prep//a.calhoun, Onset Date: [id number]  . Linezolid Hives and Rash       . Methadone Hcl Other (See Comments)    HALLUCINATIONS   . Promethazine Other (See Comments)    Mental status change, DELIRIUM   Other reaction(s): erratic behavior Other reaction(s): Other (See Comments) Mental status change DELIRIUM (pulled out IV)  . Morphine Sulfate Other (See Comments)    Pt not sure about allergy   . Other Hives    Other reaction(s): erratic behanvior Other reaction(s): erratic behavior  . Oxycodone Hcl Other (See Comments)    Pt not sure about allergy   . Penicillins Other (See Comments)    Pt not sure about allergy   . Quetiapine Other (See Comments)    Pt not sure about allergy  Other reaction(s): Other (See Comments) Pt not sure about allergy  Pt not sure about allergy   . Statins     Other reaction(s): muscle weakness Other reaction(s): muscle weakness  . Zolpidem Other (See Comments)    Other reaction(s): erratic behanvior Other reaction(s): Delusions (intolerance) Altered mental status  . Atorvastatin Itching and Other (See Comments)    Tired, weakness   .  Carbidopa-Levodopa Anxiety  . Clindamycin Rash  . Colesevelam Other (See Comments)    tired   . Doxazosin Rash  . Lovastatin Other (See Comments)    Tired, nervousness   . Rosuvastatin Other (See Comments)    Tired, weakness      Home Medications  Prior to Admission medications   Medication Sig Start Date End Date Taking? Authorizing Provider  acetaminophen (TYLENOL) 500 MG tablet Take 500 mg by mouth every 6 (six) hours as needed for mild pain.   Yes [provider]  aspirin 81 MG EC tablet Take 81 mg by mouth daily.   Yes [provider]  busPIRone (BUSPAR) 10 MG tablet Take 10 mg  by mouth 2 (two) times daily. 08/25/20  Yes [provider]  chlorthalidone (HYGROTON) 25 MG tablet Take 12.5 mg by mouth daily. 07/16/19  Yes [provider]  co-enzyme Q-10 50 MG capsule Take 50 mg by mouth daily.   Yes [provider]  diltiazem (CARDIZEM CD) 120 MG 24 hr capsule Take 120 mg by mouth daily.   Yes [provider]  DULoxetine (CYMBALTA) 20 MG capsule Take 20 mg by mouth daily.   Yes [provider]  ferrous sulfate 325 (65 FE) MG EC tablet Take 325 mg by mouth daily.  05/28/18  Yes [provider]  fluticasone (FLONASE) 50 MCG/ACT nasal spray Place 2 sprays into both nostrils daily. 06/21/20  Yes [provider]  gabapentin (NEURONTIN) 600 MG tablet Take 1,200 mg by mouth 3 (three) times daily. 04/27/19  Yes [provider]  HYDROcodone-acetaminophen (NORCO/VICODIN) 5-325 MG tablet Take 1 tablet by mouth at bedtime as needed for pain. 07/17/20  Yes [provider]  LANTUS SOLOSTAR 100 UNIT/ML Solostar Pen Inject 90 Units into the skin 2 (two) times daily. 06/05/20  Yes [provider]  metFORMIN (GLUCOPHAGE-XR) 500 MG 24 hr tablet Take 500 mg by mouth daily with breakfast. 01/17/20  Yes [provider]  nitroGLYCERIN (NITROSTAT) 0.4 MG SL tablet Place 0.4 mg under the tongue every 5  (five) minutes as needed for chest pain. 06/22/20  Yes [provider]  ofloxacin (OCUFLOX) 0.3 % ophthalmic solution Place 1 drop into both eyes 4 (four) times daily as needed (eye irritation). 03/07/19  Yes [provider]  Omega-3 Fatty Acids (FISH OIL) 1000 MG CAPS Take 1,000 mg by mouth daily.   Yes [provider]  ondansetron (ZOFRAN-ODT) 4 MG disintegrating tablet Take 4 mg by mouth every 8 (eight) hours as needed for nausea or vomiting. 02/07/20  Yes [provider]  polyethylene glycol powder (GLYCOLAX/MIRALAX) 17 GM/SCOOP powder Take 17 g by mouth daily.   Yes [provider]  Rotigotine (NEUPRO) 8 MG/24HR PT24 Place 1 patch onto the skin every evening.   Yes [provider]  Semaglutide,0.25 or 0.5MG /DOS, (OZEMPIC, 0.25 OR 0.5 MG/DOSE,) 2 MG/1.5ML SOPN Inject 0.5 mg into the skin every Tuesday.   Yes [provider]  tadalafil (CIALIS) 5 MG tablet Take 5 mg by mouth daily. 04/13/20  Yes [provider]  traMADol (ULTRAM) 50 MG tablet Take 50 mg by mouth every 6 (six) hours as needed for pain. 08/25/20  Yes [provider]  valsartan (DIOVAN) 320 MG tablet Take 320 mg by mouth daily.   Yes [provider]  allopurinol (ZYLOPRIM) 100 MG tablet Take 2 tablets daily. Patient not taking: No sig reported 02/03/17   Trula Slade, DPM  BD PEN NEEDLE NANO 2ND GEN 32G X 4 MM MISC FOR LANTUS AND VICTOZA PENS 11/30/19   [provider]     Critical care time: 55  minutes

## 2020-09-10 NOTE — Procedures (Signed)
Arterial Catheter Insertion Procedure Note  REGGINALD PASK  [id number]  1935-04-11  Date:09/10/20  Time:10:17 AM    Provider Performing: Rosann Auerbach    Procedure: Insertion of Arterial Line [phone]) without US guidance  Indication(s) Blood pressure monitoring and/or need for frequent ABGs  Consent Unable to obtain consent due to emergent nature of procedure.  Anesthesia None   Time Out Verified patient identification, verified procedure, site/side was marked, verified correct patient position, special equipment/implants available, medications/allergies/relevant history reviewed, required imaging and test results available.   Sterile Technique Maximal sterile technique including full sterile barrier drape, hand hygiene, sterile gown, sterile gloves, mask, hair covering, sterile ultrasound probe cover (if used).   Procedure Description Area of catheter insertion was cleaned with chlorhexidine and draped in sterile fashion. Without real-time ultrasound guidance an arterial catheter was placed into the right radial artery.  Appropriate arterial tracings confirmed on monitor.     Complications/Tolerance None; patient tolerated the procedure well.   EBL Minimal   Specimen(s) None

## 2020-09-11 ENCOUNTER — Inpatient Hospital Stay (HOSPITAL_COMMUNITY): Payer: Medicare Other

## 2020-09-11 DIAGNOSIS — N1831 Chronic kidney disease, stage 3a: Secondary | ICD-10-CM | POA: Diagnosis not present

## 2020-09-11 DIAGNOSIS — I480 Paroxysmal atrial fibrillation: Secondary | ICD-10-CM

## 2020-09-11 DIAGNOSIS — E1121 Type 2 diabetes mellitus with diabetic nephropathy: Secondary | ICD-10-CM | POA: Diagnosis not present

## 2020-09-11 DIAGNOSIS — G9340 Encephalopathy, unspecified: Secondary | ICD-10-CM

## 2020-09-11 DIAGNOSIS — N401 Enlarged prostate with lower urinary tract symptoms: Secondary | ICD-10-CM | POA: Diagnosis not present

## 2020-09-11 DIAGNOSIS — N183 Chronic kidney disease, stage 3 unspecified: Secondary | ICD-10-CM | POA: Diagnosis not present

## 2020-09-11 DIAGNOSIS — I129 Hypertensive chronic kidney disease with stage 1 through stage 4 chronic kidney disease, or unspecified chronic kidney disease: Secondary | ICD-10-CM | POA: Diagnosis not present

## 2020-09-11 LAB — GLUCOSE, CAPILLARY
Glucose-Capillary: 142 mg/dL — ABNORMAL HIGH (ref 70–99)
Glucose-Capillary: 145 mg/dL — ABNORMAL HIGH (ref 70–99)
Glucose-Capillary: 154 mg/dL — ABNORMAL HIGH (ref 70–99)
Glucose-Capillary: 159 mg/dL — ABNORMAL HIGH (ref 70–99)
Glucose-Capillary: 160 mg/dL — ABNORMAL HIGH (ref 70–99)
Glucose-Capillary: 99 mg/dL (ref 70–99)

## 2020-09-11 LAB — COMPREHENSIVE METABOLIC PANEL
ALT: 48 U/L — ABNORMAL HIGH (ref 0–44)
AST: 109 U/L — ABNORMAL HIGH (ref 15–41)
Albumin: 3.7 g/dL (ref 3.5–5.0)
Alkaline Phosphatase: 50 U/L (ref 38–126)
Anion gap: 9 (ref 5–15)
BUN: 49 mg/dL — ABNORMAL HIGH (ref 8–23)
CO2: 25 mmol/L (ref 22–32)
Calcium: 8.8 mg/dL — ABNORMAL LOW (ref 8.9–10.3)
Chloride: 106 mmol/L (ref 98–111)
Creatinine, Ser: 1.62 mg/dL — ABNORMAL HIGH (ref 0.61–1.24)
GFR, Estimated: 42 mL/min — ABNORMAL LOW (ref >60)
Glucose, Bld: 103 mg/dL — ABNORMAL HIGH (ref 70–99)
Potassium: 3.5 mmol/L (ref 3.5–5.1)
Sodium: 140 mmol/L (ref 135–145)
Total Bilirubin: 1.6 mg/dL — ABNORMAL HIGH (ref 0.3–1.2)
Total Protein: 6.2 g/dL — ABNORMAL LOW (ref 6.5–8.1)

## 2020-09-11 LAB — CBC WITH DIFFERENTIAL/PLATELET
Abs Immature Granulocytes: 0.09 10*3/uL — ABNORMAL HIGH (ref 0.00–0.07)
Basophils Absolute: 0.1 10*3/uL (ref 0.0–0.1)
Basophils Relative: 1 %
Eosinophils Absolute: 0 10*3/uL (ref 0.0–0.5)
Eosinophils Relative: 0 %
HCT: 54.4 % — ABNORMAL HIGH (ref 39.0–52.0)
Hemoglobin: 17.4 g/dL — ABNORMAL HIGH (ref 13.0–17.0)
Immature Granulocytes: 1 %
Lymphocytes Relative: 10 %
Lymphs Abs: 1.6 10*3/uL (ref 0.7–4.0)
MCH: 28.6 pg (ref 26.0–34.0)
MCHC: 32 g/dL (ref 30.0–36.0)
MCV: 89.3 fL (ref 80.0–100.0)
Monocytes Absolute: 1.2 10*3/uL — ABNORMAL HIGH (ref 0.1–1.0)
Monocytes Relative: 7 %
Neutro Abs: 13.9 10*3/uL — ABNORMAL HIGH (ref 1.7–7.7)
Neutrophils Relative %: 81 %
Platelets: 167 10*3/uL (ref 150–400)
RBC: 6.09 MIL/uL — ABNORMAL HIGH (ref 4.22–5.81)
RDW: 14.6 % (ref 11.5–15.5)
WBC: 16.8 10*3/uL — ABNORMAL HIGH (ref 4.0–10.5)
nRBC: 0 % (ref 0.0–0.2)

## 2020-09-11 LAB — MAGNESIUM: Magnesium: 2.1 mg/dL (ref 1.7–2.4)

## 2020-09-11 LAB — HEPARIN LEVEL (UNFRACTIONATED): Heparin Unfractionated: 0.49 IU/mL (ref 0.30–0.70)

## 2020-09-11 LAB — CK: Total CK: 1227 U/L — ABNORMAL HIGH (ref 49–397)

## 2020-09-11 LAB — PROCALCITONIN: Procalcitonin: 0.26 ng/mL

## 2020-09-11 LAB — PHOSPHORUS: Phosphorus: 3.9 mg/dL (ref 2.5–4.6)

## 2020-09-11 LAB — RPR: RPR Ser Ql: NONREACTIVE

## 2020-09-11 IMAGING — DX DG TOE 2ND 2+V*L*
3 series · 3 of 3 positions shown · non-contrast
Comparison: Foot radiograph 652

CLINICAL DATA: Pain and bruising.

EXAM:
LEFT SECOND TOE

[toe ap]
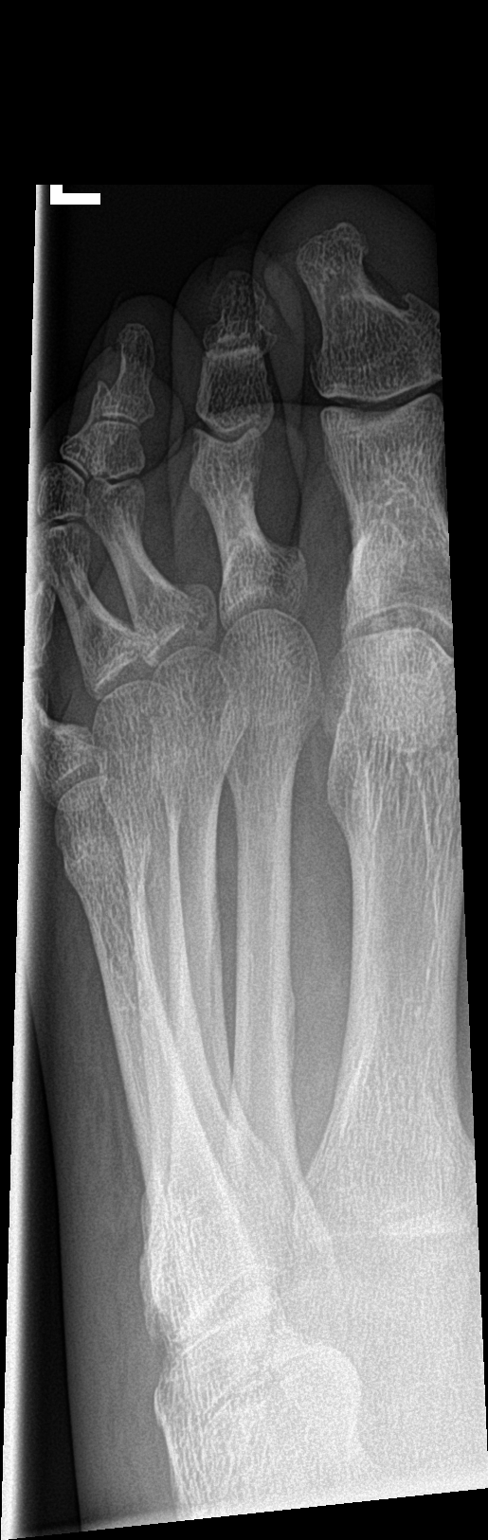

[toe obl]
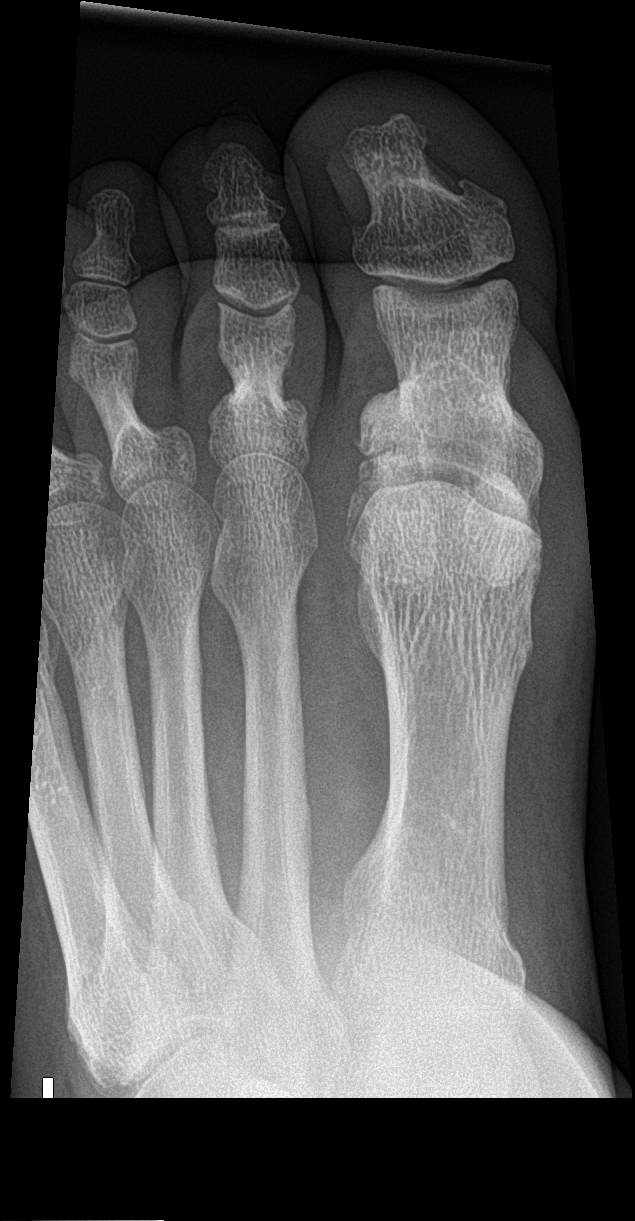

[toe lat]
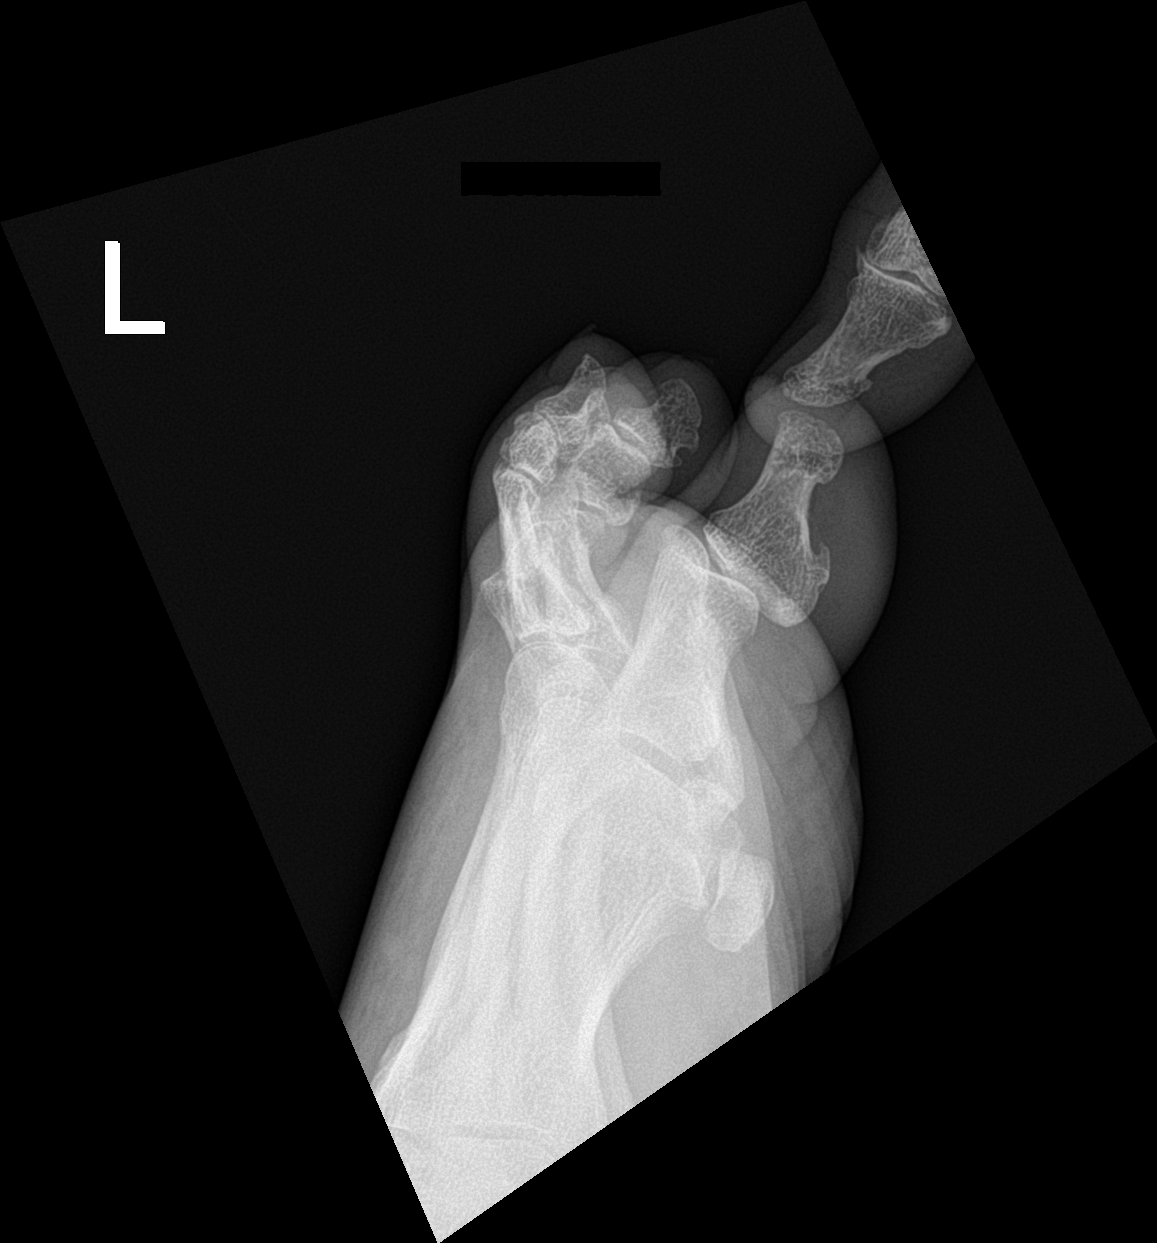

[3 of 3 positions shown; findings below may reference images not displayed]

FINDINGS: There is no evidence of fracture or dislocation. Mild degenerative
change of the proximal interphalangeal joint with spurring. No
erosion or bone destruction. Soft tissues are unremarkable.
IMPRESSION: Mild degenerative change without acute findings of the second toe.

## 2020-09-11 IMAGING — DX DG TOE 3RD 2+V*L*
3 series · 3 of 3 positions shown · non-contrast
Comparison: Foot radiograph [DATE]

CLINICAL DATA: Pain and bruising.

EXAM:
LEFT THIRD TOE

[toe ap]
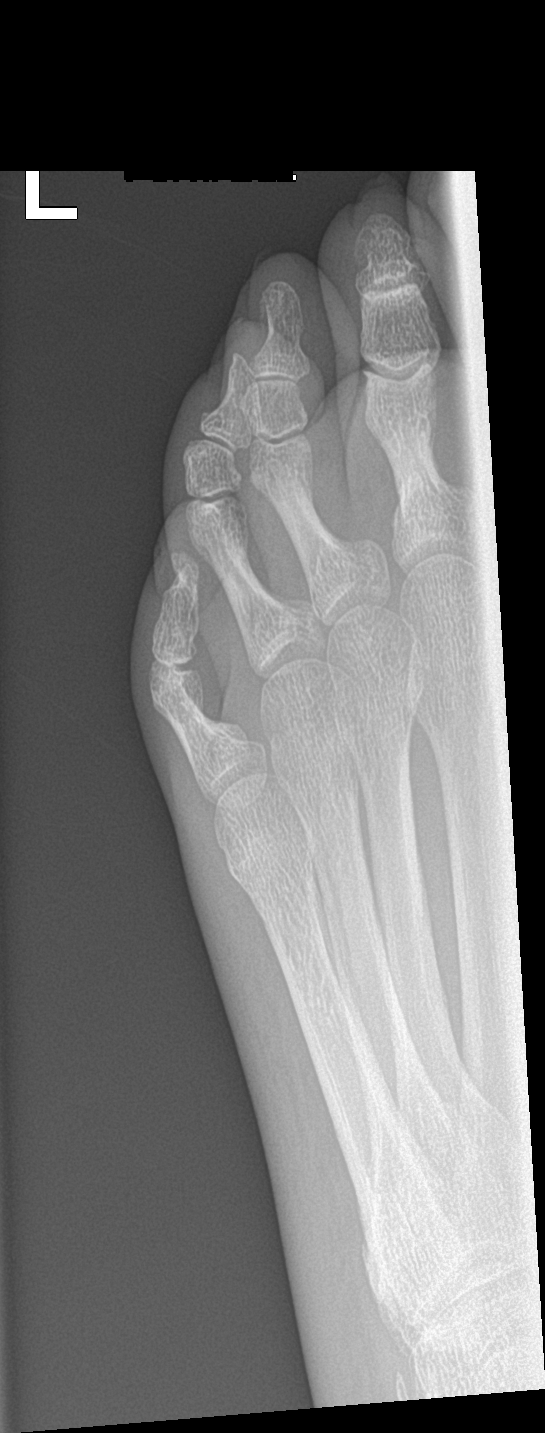

[toe obl]
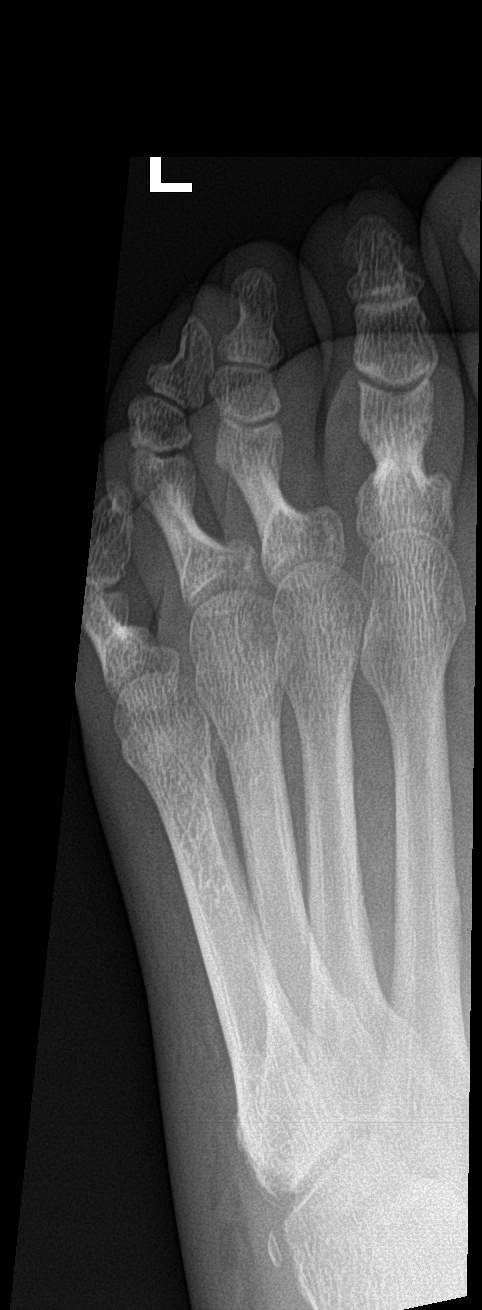

[toe lat]
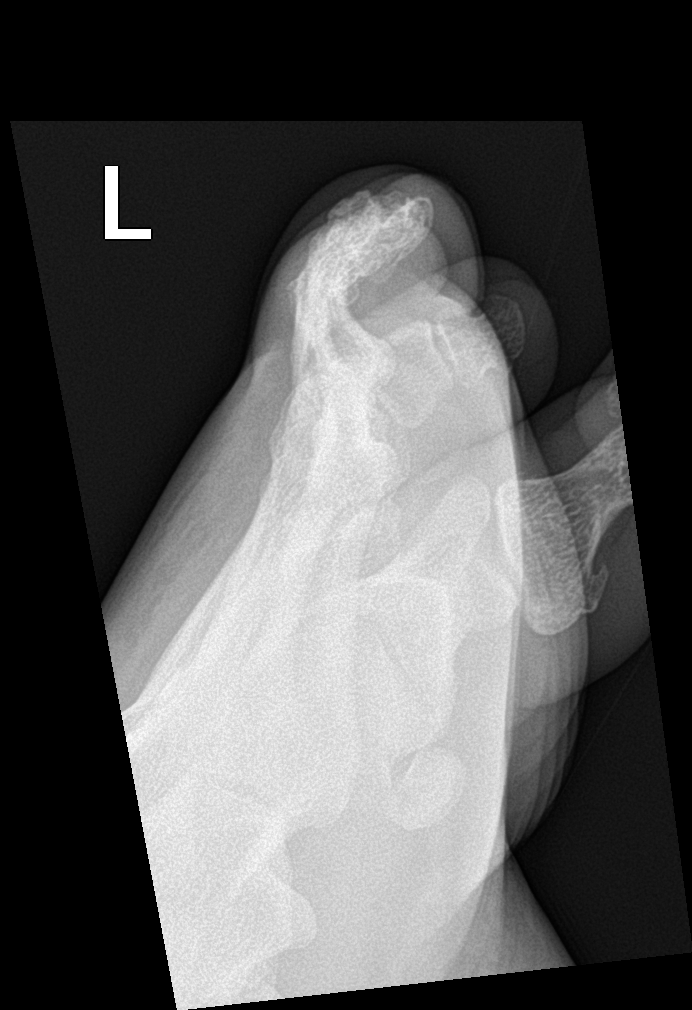

[3 of 3 positions shown; findings below may reference images not displayed]

FINDINGS: Lateral view limited by positioning and osseous overlap. There is no
evidence of fracture or dislocation. There is no evidence of
arthropathy or other focal bone abnormality. Soft tissues are
unremarkable.
IMPRESSION: Negative radiographs of the left third toe.

## 2020-09-11 MED ORDER — AMIODARONE IV BOLUS ONLY 150 MG/100ML
150.0000 mg | Freq: Once | INTRAVENOUS | Status: AC
  Administered 2020-09-11: 150 mg via INTRAVENOUS
  Filled 2020-09-11: qty 100

## 2020-09-11 MED ORDER — HYDROMORPHONE HCL 1 MG/ML IJ SOLN
0.5000 mg | INTRAMUSCULAR | Status: DC | PRN
  Administered 2020-09-11 (×2): 0.5 mg via INTRAVENOUS
  Filled 2020-09-11 (×2): qty 1

## 2020-09-11 MED ORDER — POTASSIUM CHLORIDE 10 MEQ/100ML IV SOLN: 10.0000 meq | INTRAVENOUS | Status: DC

## 2020-09-11 MED ORDER — HYDROMORPHONE HCL 1 MG/ML IJ SOLN
0.5000 mg | Freq: Once | INTRAMUSCULAR | Status: AC
  Administered 2020-09-11: 0.5 mg via INTRAVENOUS

## 2020-09-11 MED ORDER — POTASSIUM CHLORIDE 10 MEQ/100ML IV SOLN
10.0000 meq | INTRAVENOUS | Status: AC
  Administered 2020-09-11 (×4): 10 meq via INTRAVENOUS
  Filled 2020-09-11 (×4): qty 100

## 2020-09-11 MED ORDER — HYDRALAZINE HCL 20 MG/ML IJ SOLN
10.0000 mg | Freq: Four times a day (QID) | INTRAMUSCULAR | Status: DC | PRN
  Administered 2020-09-12 – 2020-09-15 (×6): 10 mg via INTRAVENOUS
  Filled 2020-09-11 (×6): qty 1

## 2020-09-11 MED ORDER — ROTIGOTINE 8 MG/24HR TD PT24
1.0000 | MEDICATED_PATCH | Freq: Every evening | TRANSDERMAL | Status: DC
  Administered 2020-09-11 – 2020-09-24 (×14): 1 via TRANSDERMAL
  Filled 2020-09-11: qty 1

## 2020-09-11 MED ORDER — AMIODARONE HCL 200 MG PO TABS
200.0000 mg | ORAL_TABLET | Freq: Every day | ORAL | Status: DC
  Administered 2020-09-11 – 2020-09-25 (×13): 200 mg via ORAL
  Filled 2020-09-11 (×13): qty 1

## 2020-09-11 MED ORDER — KCL-LACTATED RINGERS-D5W 20 MEQ/L IV SOLN
INTRAVENOUS | Status: DC
  Filled 2020-09-11 (×9): qty 1000

## 2020-09-11 MED ORDER — CLEVIDIPINE BUTYRATE 0.5 MG/ML IV EMUL
0.0000 mg/h | INTRAVENOUS | Status: DC
Start: 2020-09-11 — End: 2020-09-11
  Filled 2020-09-11: qty 50

## 2020-09-11 MED ORDER — CHLORHEXIDINE GLUCONATE CLOTH 2 % EX PADS: 6.0000 | MEDICATED_PAD | Freq: Every day | CUTANEOUS | Status: DC

## 2020-09-11 MED ORDER — METOPROLOL SUCCINATE ER 25 MG PO TB24
50.0000 mg | ORAL_TABLET | Freq: Every day | ORAL | Status: DC
  Administered 2020-09-11 – 2020-09-13 (×3): 50 mg via ORAL
  Filled 2020-09-11 (×3): qty 2

## 2020-09-11 MED ORDER — HYDROMORPHONE HCL 1 MG/ML IJ SOLN
0.5000 mg | Freq: Four times a day (QID) | INTRAMUSCULAR | Status: DC | PRN
  Administered 2020-09-12 – 2020-09-15 (×11): 0.5 mg via INTRAVENOUS
  Filled 2020-09-11 (×13): qty 1

## 2020-09-11 NOTE — Progress Notes (Signed)
SLP Cancellation Note  Patient Details Name: Jason Hartman MRN: [id number] DOB: 1936-02-27   Cancelled treatment:       Reason Eval/Treat Not Completed: Patient's level of consciousness. Patient asleep and per RN who is in room, has been very lethargic and sedated. SLP to check in later today for patient readiness to have BSE. Thank you for this consult!  Sonia Baller, MA, CCC-SLP Speech Therapy

## 2020-09-11 NOTE — Progress Notes (Signed)
PCCM Interval Note  Improving mental status. No sedation, vasopressor or vent needs.  Pulmonary will sign off. Please re-consult if needed.

## 2020-09-11 NOTE — Evaluation (Signed)
Clinical/Bedside Swallow Evaluation Patient Details  Name: Jason Hartman MRN: [id number] Date of Birth: 06-Feb-1936  Today's Date: 09/11/2020 Time: SLP Start Time (ACUTE ONLY): 1420 SLP Stop Time (ACUTE ONLY): 1440 SLP Time Calculation (min) (ACUTE ONLY): 20 min  Past Medical History:  Past Medical History:  Diagnosis Date  . Cellulitis and abscess of leg, except foot   . Hypertension   . Obstructive sleep apnea (adult) (pediatric)   . Pain in joint, pelvic region and thigh   . Pneumonia, organism unspecified(486)   . Restless legs syndrome (RLS)   . RLS (restless legs syndrome) 09/01/2012  . Thoracic or lumbosacral neuritis or radiculitis, unspecified   . Type II or unspecified type diabetes mellitus without mention of complication, not stated as uncontrolled   . Unspecified disease of pericardium   . Unspecified hereditary and idiopathic peripheral neuropathy    Past Surgical History:  Past Surgical History:  Procedure Laterality Date  . ANAL FISSURE REPAIR    . pericarditis  2009   HPI:  Patient is an 84 y.o. male with PMH: OSA, Parkinson's disease, RLS, anxiety, chronic pain, CKD, DM-2 insulin dependent, who presented to ED for evaluation of anxiety and diaphoresis. Per family report, patient has been increasingly anxious over past couple weeks. Patient reported in ED that he had recently been told to stop taking Xanax and was started on something else but couldnt remember name. Family reported patient has not been taking his medications recently. CT head negative for intracranial abnormalities and CXR revealed decreased lung volumes with bibasilar atelectasis.   Assessment / Plan / Recommendation Clinical Impression  Patient presents with a mild oropharyngeal dysphagia with mild instances overall of immediate and slightly delayed coughing during intake of thin liquids. He did have one incidence of coughing following consumption of graham crackers and water which lasted  approximately 5-7 seconds. Voice remained clear and no change in vitals. Patient is anxious and with some confusion, however he is able to feed himself and is not impulsive or unsafe with PO intake. SLP recommends to continue on Regular solids, thin liquids and plan for SLP to check patient's toleration of PO diet at least one more time to ensure safety. SLP Visit Diagnosis: Dysphagia, unspecified (R13.10)    Aspiration Risk  Mild aspiration risk    Diet Recommendation Regular;Thin liquid   Liquid Administration via: Cup;Straw Medication Administration: Whole meds with liquid Postural Changes: Seated upright at 90 degrees    Other  Recommendations Oral Care Recommendations: Oral care BID   Follow up Recommendations None      Frequency and Duration min 1 x/week  1 week       Prognosis Prognosis for Safe Diet Advancement: Good      Swallow Study   General Date of Onset: 09/11/20 HPI: Patient is an 85 y.o. male with PMH: OSA, Parkinson's disease, RLS, anxiety, chronic pain, CKD, DM-2 insulin dependent, who presented to ED for evaluation of anxiety and diaphoresis. Per family report, patient has been increasingly anxious over past couple weeks. Patient reported in ED that he had recently been told to stop taking Xanax and was started on something else but couldnt remember name. Family reported patient has not been taking his medications recently. CT head negative for intracranial abnormalities and CXR revealed decreased lung volumes with bibasilar atelectasis. Type of Study: Bedside Swallow Evaluation Previous Swallow Assessment: None found Diet Prior to this Study: Regular;Thin liquids Temperature Spikes Noted: No Respiratory Status: Nasal cannula History of Recent Intubation: No  Behavior/Cognition: Alert;Cooperative Oral Cavity Assessment: Within Functional Limits Oral Care Completed by SLP: No Oral Cavity - Dentition: Adequate natural dentition Vision: Functional for  self-feeding Self-Feeding Abilities: Able to feed self Patient Positioning: Upright in bed Baseline Vocal Quality: Normal Volitional Cough: Strong Volitional Swallow: Able to elicit    Oral/Motor/Sensory Function Overall Oral Motor/Sensory Function: Within functional limits   Ice Chips     Thin Liquid Thin Liquid: Impaired Presentation: Straw;Self Fed Pharyngeal  Phase Impairments: Cough - Immediate;Cough - Delayed Other Comments: mostly mild intermittent dry sounding cough when drinking water, however at end of evaluation, he had a coughing spell that lasted over 5-7 seconds    Nectar Thick     Honey Thick     Puree Puree: Not tested   Solid     Solid: Within functional limits Presentation: [place], MA, CCC-SLP Speech Therapy

## 2020-09-11 NOTE — Progress Notes (Signed)
eLink Physician-Brief Progress Note Patient Name: DAVARION CUFFEE DOB: 1935-05-19 MRN: [id number]   Date of Service  09/11/2020  HPI/Events of Note  RN reports patient needing Dilaudid more frequently than Q4H PRN to maintain calm.   eICU Interventions  Adjusted dose to Q3H PRN.     Intervention Category Minor Interventions: Agitation / anxiety - evaluation and management  Marily Lente Amanuel [place] 09/11/2020, 2:17 AM

## 2020-09-11 NOTE — TOC Initial Note (Signed)
Transition of Care [facility]) - Initial/Assessment Note    Patient Details  Name: Jason Hartman MRN: [id number] Date of Birth: 01-22-1936  Transition of Care [facility]) CM/SW Contact:    Leeroy Cha, RN Phone Number: 09/11/2020, 8:15 AM  Clinical Narrative:                 85 y.o. male with medical history significant for OSA, Parkinson's, restless leg syndrome, anxiety, chronic pain, chronic kidney disease, and insulin-dependent diabetes mellitus, now presenting to the emergency department for evaluation of anxiety and diaphoresis.  Patient has a long history of anxiety and per report of his family, has been increasingly anxious over the past couple weeks, has been saying that he feels like he is going to die, and has been sweating profusely.  Patient states that he feels "horrible," but has difficulty providing any more specificity though he denies chest pain, abdominal pain, flank pain, cough, dysuria, vomiting, or diarrhea.  He also denies hallucinations or suicidal ideation.  Patient states that he recently was told to stop taking Xanax.  He states that he was started on a different medication for anxiety but does not remember the name of it.  Family reported that the patient has not been taking his medications recently.  ED Course: Upon arrival to the ED, patient is found to be afebrile, saturating mid 90s on room air, tachycardic to 120, and with stable blood pressure.  EKG features sinus tachycardia with PVCs.  Chest x-ray with mild stable right basilar scar or atelectasis.  Chemistry panel notable for glucose 308, bilirubin 2.7, and creatinine 1.30.  CBC features a leukocytosis to 15,700 and polycythemia with hemoglobin 19.0.  High-sensitivity troponin was 28 and 32.  Urinalysis with glucosuria, ketonuria, microscopic hematuria, and proteinuria.  Blood cultures were collected in the emergency department and the patient was given IV fluids and Azactam. Patient is from home with wife, plan is to  return to home Expected Discharge Plan: Home/Self Care Barriers to Discharge: Continued Medical Work up   Patient Goals and CMS Choice Patient states their goals for this hospitalization and ongoing recovery are:: unable to state CMS Medicare.gov Compare Post Acute Care list provided to:: Patient    Expected Discharge Plan and Services Expected Discharge Plan: Home/Self Care   Discharge Planning Services: CM Consult   Living arrangements for the past 2 months: Single Family Home                                      Prior Living Arrangements/Services Living arrangements for the past 2 months: Single Family Home Lives with:: Spouse Patient language and need for interpreter reviewed:: Yes Do you feel safe going back to the place where you live?: Yes      Need for Family Participation in Patient Care: Yes (Comment) Care giver support system in place?: Yes (comment) (wife is the caregiver)   Criminal Activity/Legal Involvement Pertinent to Current Situation/Hospitalization: No - Comment as needed  Activities of Daily Living Home Assistive Devices/Equipment: None ADL Screening (condition at time of admission) Patient's cognitive ability adequate to safely complete daily activities?: No Is the patient deaf or have difficulty hearing?: No Does the patient have difficulty seeing, even when wearing glasses/contacts?: No Does the patient have difficulty concentrating, remembering, or making decisions?: Yes Patient able to express need for assistance with ADLs?: No Does the patient have difficulty dressing or bathing?: Yes  Independently performs ADLs?: No Does the patient have difficulty walking or climbing stairs?: Yes Weakness of Legs: Both Weakness of Arms/Hands: Both  Permission Sought/Granted                  Emotional Assessment Appearance:: Appears stated age Attitude/Demeanor/Rapport: Other (comment) (confused)   Orientation: : Fluctuating Orientation  (Suspected and/or reported Sundowners) Alcohol / Substance Use: Not Applicable Psych Involvement: No (comment)  Admission diagnosis:  Dehydration [E86.0] Diaphoresis [R61] Anxiety [F41.9] Elevated troponin I level [R77.8] Elevated bilirubin [R17] Urinary tract infection without hematuria, site unspecified [P82.4] Acute metabolic encephalopathy [M35.36] Patient Active Problem List   Diagnosis Date Noted  . Acute metabolic encephalopathy 14/43/1540  . Anxiety 09/09/2020  . SIRS (systemic inflammatory response syndrome) ([place]) 09/09/2020  . Diaphoresis   . Slow transit constipation 08/07/2020  . Abdominal aortic aneurysm without rupture ([place]) 03/17/2020  . Abnormal gait 03/17/2020  . Ascending aorta dilatation (H[facility]) 03/17/2020  . Diabetic renal disease ([place]) 03/17/2020  . Recurrent UTI 07/16/2019  . Hypertensive urgency 04/26/2019  . Malignant HTN with heart disease, w/o CHF, w/o chronic kidney disease 04/26/2019  . Elevated troponin   . LFTs abnormal   . Statin intolerance 01/15/2019  . Penis pain 04/10/2018  . Urethra cancer ([place]) 02/25/2018  . Lower extremity edema 06/09/2017  . Peptic ulcer disease 05/16/2017  . GERD (gastroesophageal reflux disease) 05/16/2017  . Chronic kidney disease, stage 3a ([place]) 04/11/2017  . Lower urinary tract symptoms (LUTS) 04/15/2016  . Narcotic dependence ([place]) 08/31/2015  . Imbalance 08/31/2015  . Diabetic peripheral neuropathy ([place]) 08/31/2015  . Other malaise and fatigue 05/02/2015  . Chronic fatigue 05/02/2015  . Panic disorder without agoraphobia 03/22/2015  . Hyperlipidemia 02/03/2014  . Thrombocytopenia ([place]) 08/18/2013  . Parkinsonism ([place]) 08/18/2013  . Other disorders of lung 08/18/2013  . Organic impotence 08/18/2013  . Memory loss 08/18/2013  . Low back pain 08/18/2013  . Iron deficiency anemia 08/18/2013  . Hypercalcemia 08/18/2013  . Cellulitis of right leg 08/18/2013  . Atherosclerotic heart disease of native coronary  artery without angina pectoris 08/18/2013  . RLS (restless legs syndrome) 09/01/2012  . HERPES SIMPLEX INFECTION 11/06/2006  . Type II diabetes mellitus ([place]) 11/06/2006  . HYPOGONADISM 11/06/2006  . VITAMIN D DEFICIENCY 11/06/2006  . ANXIETY 11/06/2006  . OBSTRUCTIVE SLEEP APNEA 11/06/2006  . RESTLESS LEG SYNDROME 11/06/2006  . HYPERTENSION 11/06/2006  . BENIGN PROSTATIC HYPERTROPHY 11/06/2006  . ROSACEA 11/06/2006  . OSTEOARTHRITIS 11/06/2006  . INSOMNIA 11/06/2006  . HYPERGLYCEMIA 11/06/2006  . COLONIC POLYPS, HX OF 11/06/2006   PCP:  [name], MD Pharmacy:   [place], [place] - [address] [place] [address] Phone: [phone] Fax: [id number] - [place], [place] - [address] AT [place] [address] [place] [address] Phone: [phone] Fax: [phone]     Social Determinants of Health (SDOH) Interventions    Readmission Risk Interventions No flowsheet data found.

## 2020-09-11 NOTE — Progress Notes (Addendum)
Neurology Progress Note  S: Per RN, patient required multiple sedative medications to keep him calm. Today, he has been alert, calm, and cooperative. He is talking sensibly today. He has not had sedative medication today. Per PCCM MD, today is the best patient has been in re: mental status.   O: Current vital signs: BP (!) 131/54   Pulse 75   Temp (!) 97.5 F (36.4 C) (Oral)   Resp 15   Ht 5\' 4"  (1.626 m)   Wt 83.8 kg   SpO2 99%   BMI 31.71 kg/m  Vital signs in last 24 hours: Temp:  [96.8 F (36 C)-98.4 F (36.9 C)] 97.5 F (36.4 C) (06/06 1600) Pulse Rate:  [55-108] 75 (06/06 1800) Resp:  [11-22] 15 (06/06 1800) BP: (113-165)/(51-103) 131/54 (06/06 1800) SpO2:  [94 %-100 %] 99 % (06/06 1800) Arterial Line BP: (118-226)/(59-97) 151/71 (06/06 1300) Weight:  [83.8 kg] 83.8 kg (06/06 0418)  GENERAL: Awake, alert in NAD. Afebrile.  HEENT: Normocephalic and atraumatic LUNGS: Normal respiratory effort.  CV: RRR on tele  NEURO:  Mental Status: Alert and oriented to self and place. Disoriented to time. Tells NP he is in the hospital because he was sick at home.  Speech/Language: speech is without aphasia or dysarthria. Naming, repetition, fluency, and comprehension intact. PERRL. Conservation officer, nature. Moves all 4 extremities purposefully. Grips are 4/5 and equal.   Medications  Current Facility-Administered Medications:  .  0.9 %  sodium chloride infusion, 250 mL, Intravenous, Continuous, Florene Glen, A Clint Lipps., MD .  [CANCELED] Place/Maintain arterial line, , , Until Discontinued **AND** 0.9 %  sodium chloride infusion, , Intra-arterial, PRN, Clarene Critchley, RN .  acetaminophen (TYLENOL) tablet 650 mg, 650 mg, Oral, Q6H PRN, 650 mg at 09/11/20 1116 **OR** acetaminophen (TYLENOL) suppository 650 mg, 650 mg, Rectal, Q6H PRN, Opyd, Timothy S, MD .  amiodarone (PACERONE) tablet 200 mg, 200 mg, Oral, Daily, Josue Hector, MD, 200 mg at 09/11/20 1045 .  [START ON 09/12/2020] Chlorhexidine  Gluconate Cloth 2 % PADS 6 each, 6 each, Topical, Q0600, Elodia Florence., MD .  dextrose 5% in lactated ringers with KCl 20 mEq/L infusion, , Intravenous, Continuous, Florene Glen, Kendra Opitz., MD .  diazepam (VALIUM) injection 5 mg, 5 mg, Intravenous, Q4H PRN, Lang Snow, FNP, 5 mg at 09/11/20 0553 .  diazepam (VALIUM) injection 5 mg, 5 mg, Intravenous, Q4H, Lang Snow, FNP, 5 mg at 09/11/20 1509 .  heparin ADULT infusion 100 units/mL (25000 units/233mL), 1,100 Units/hr, Intravenous, Continuous, Efraim Kaufmann, RPH, Last Rate: 11 mL/hr at 09/11/20 1718, 1,100 Units/hr at 09/11/20 1718 .  hydrALAZINE (APRESOLINE) injection 10 mg, 10 mg, Intravenous, Q6H PRN, Margaretha Seeds, MD .  HYDROmorphone (DILAUDID) injection 0.5 mg, 0.5 mg, Intravenous, Q6H PRN, Elodia Florence., MD .  insulin aspart (novoLOG) injection 0-9 Units, 0-9 Units, Subcutaneous, Q4H, Lavina Hamman, MD, 2 Units at 09/11/20 1610 .  MEDLINE mouth rinse, 15 mL, Mouth Rinse, BID, Elodia Florence., MD, 15 mL at 09/11/20 1006 .  metoprolol succinate (TOPROL-XL) 24 hr tablet 50 mg, 50 mg, Oral, Daily, Josue Hector, MD, 50 mg at 09/11/20 1045 .  mupirocin ointment (BACTROBAN) 2 % 1 application, 1 application, Nasal, BID, Elodia Florence., MD, 1 application at 33/82/50 1001 .  naloxone [facility]) injection 0.4 mg, 0.4 mg, Intravenous, PRN, Elodia Florence., MD .  nitroGLYCERIN (NITROGLYN) 2 % ointment 1 inch, 1 inch, Topical, Q8H, Tanda Rockers,  MD, 1 inch at 09/11/20 1349 .  ofloxacin (OCUFLOX) 0.3 % ophthalmic solution 1 drop, 1 drop, Both Eyes, QID PRN, Elodia Florence., MD .  ondansetron [facility]) tablet 4 mg, 4 mg, Oral, Q6H PRN **OR** ondansetron (ZOFRAN) injection 4 mg, 4 mg, Intravenous, Q6H PRN, Opyd, Ilene Qua, MD .  Rotigotine PT24 1 patch, 1 patch, Transdermal, QPM, Elodia Florence., MD, 1 patch at 09/11/20 1751 .  senna-docusate (Senokot-S) tablet 1 tablet, 1 tablet, Oral,  QHS PRN, Opyd, Timothy S, MD .  technetium albumin aggregated (MAA) injection solution 4.1 millicurie, 4.1 millicurie, Intravenous, Once PRN, Felipa [place], MD .  thiamine (B-1) injection 100 mg, 100 mg, Intravenous, Daily, Elodia Florence., MD, 100 mg at 09/11/20 2248  Imaging  CT Head Atrophy with small vessel chronic ischemic changes of deep cerebral white matter. Old lacunar infarcts at basal ganglia bilaterally. No acute intracranial abnormalities.  EEG: This study is suggestive of mild diffuse encephalopathy, nonspecific etiology. No seizures or epileptiform discharges were seen throughout the recording.  Assessment: 85 yo male that presented to the ED 2 days ago for evaluation of diaphoresis, lethargy, anxiety and poor po intake. He had increased encephalopathy as evidenced by altered mental status and agitation and required sedation in ICU.  His acute encephalopathy thought to be multifactorial due to toxic metabolic encephalopathy and polypharmacy with addition of Cymbalta and Buspar 2 weeks prior to admission. Serotonin syndrome was considered but his exam was not consistent with this. EEG today was negative for seizures, so doubt his AMS was due to seizures.   Recommendations: 1. Continue Thiamine daily.  2. F/up extended drug screen.  3. Likely do not need MRI brain for now as exam is not likely consistent with structural abnormality, plus patient is refusing.  4. Wean and stop Benzo use if possible.  5. Avoid antipsychotics.  6. As no definite source of infection has been found, agree with LP tomorrow under fluoroscopy.  7. Will follow.   Pt seen by Clance Boll, MSN, APN-BC/Nurse Practitioner/Neuro and plan discussed with MD.   Pager: [phone]   Attending Attestation  I agree with the findings and recommendations in NP Kirby-Graham's note above. EEG neg for seizures. Will f/u LP tomorrow.  Su Monks, MD [facility] [phone] If  7pm- 7am, please page neurology on call as listed in [place].

## 2020-09-11 NOTE — Progress Notes (Signed)
ANTICOAGULATION CONSULT NOTE - Follow Up Consult  Pharmacy Consult for heparin infusion Indication: atrial fibrillation  Allergies  Allergen Reactions  . Oxycodone Anaphylaxis    Other reaction(s): Other (See Comments) Cannot recall specifics  . Iodinated Diagnostic Agents Hives    alleric to renografin,isovue & omnipaque, hives, requires 13 hr prep//a.calhoun, Onset Date: [id number]     . Iodine Hives and Rash    alleric to renografin,isovue & omnipaque, hives, requires 13 hr prep//a.calhoun, Onset Date: [id number] allergic to renografin,isovue & omnipaque, hives, requires 13 hr prep//a.calhoun, Onset Date: [id number]  . Linezolid Hives and Rash       . Methadone Hcl Other (See Comments)    HALLUCINATIONS   . Promethazine Other (See Comments)    Mental status change, DELIRIUM   Other reaction(s): erratic behavior Other reaction(s): Other (See Comments) Mental status change DELIRIUM (pulled out IV)  . Morphine Sulfate Other (See Comments)    Pt not sure about allergy   . Other Hives    Other reaction(s): erratic behanvior Other reaction(s): erratic behavior  . Oxycodone Hcl Other (See Comments)    Pt not sure about allergy   . Penicillins Other (See Comments)    Pt not sure about allergy   . Quetiapine Other (See Comments)    Pt not sure about allergy  Other reaction(s): Other (See Comments) Pt not sure about allergy  Pt not sure about allergy   . Statins     Other reaction(s): muscle weakness Other reaction(s): muscle weakness  . Zolpidem Other (See Comments)    Other reaction(s): erratic behanvior Other reaction(s): Delusions (intolerance) Altered mental status  . Atorvastatin Itching and Other (See Comments)    Tired, weakness   . Carbidopa-Levodopa Anxiety  . Clindamycin Rash  . Colesevelam Other (See Comments)    tired   . Doxazosin Rash  . Lovastatin Other (See Comments)    Tired, nervousness   . Rosuvastatin Other (See Comments)    Tired, weakness      Patient Measurements: Height: 5\' 4"  (162.6 cm) Weight: 83.8 kg (184 lb 11.9 oz) IBW/kg (Calculated) : 59.2 Heparin Dosing Weight: 76 kg  Vital Signs: Temp: 97.5 F (36.4 C) (06/06 0400) Temp Source: Axillary (06/06 0400) Pulse Rate: 105 (06/06 0226)  Labs: Recent Labs    09/08/20 2308 09/08/20 2308 09/08/20 2339 09/09/20 0400 09/09/20 0635 09/10/20 0926 09/10/20 1108 09/10/20 1242 09/10/20 1629 09/11/20 0318 09/11/20 0325  HGB 19.0*  --  20.1*  --   --  17.6*  --   --   --   --  17.4*  HCT 58.5*  --  59.0*  --   --  54.7*  --   --   --   --  54.4*  PLT 200  --   --   --   --  210  --   --   --   --  167  APTT  --   --   --   --   --   --  28  --   --   --   --   LABPROT  --   --   --   --   --   --  15.0  --   --   --   --   INR  --   --   --   --   --   --  1.2  --   --   --   --   HEPARINUNFRC  --   --   --   --   --   --   --   --   --  0.49  --   CREATININE  --    < > 1.30*  --   --  2.20*  --   --  1.98*  --  1.62*  CKTOTAL  --   --   --   --  263  --  2,578*  --   --   --   --   TROPONINIHS 28*  --   --  32*  --   --  388* 300*  --   --   --    < > = values in this interval not displayed.    Estimated Creatinine Clearance: 34.3 mL/min (A) (by C-G formula based on SCr of 1.62 mg/dL (H)).   Medications:  No anticoagulation prior to admission  Assessment: Pharmacy to dose heparin infusion for atrial fibrillation.  Hemoglobin slightly elevated and platelets WNL.  Baseline INR and aPTT WNL.  Was on heparin subq for dvt prophylaxis, last dose 6/5 1246.    09/11/2020:  Initial heparin level 0.49- therapeutic on IV heparin infusion at 1100 units/hr  No bleeding or infusion related issues reported by RN  CBC- Hg elevated but stable, pltc WNL  Goal of Therapy:  Heparin level 0.3-0.7 units/ml Monitor platelets by anticoagulation protocol: Yes   Plan:  Continue IV heparin infusion at 1100 units/hr Check confirmatory anti-Xa level at 1100 Daily heparin  level & CBC while on heparin Monitor for any s/s bleeding   Netta Cedars, PharmD, BCPS 09/11/2020 4:21 AM

## 2020-09-11 NOTE — Procedures (Signed)
Patient Name: SHAFER SWAMY  MRN: [id number]  Epilepsy Attending: Lora Havens  Referring Physician/Provider: Dr Margaretha Seeds Date: 09/11/2020 Duration: 1 hour 8 mins  Patient history: 85 year old male with altered mental status.  EEG to evaluate for seizures.  Level of alertness: Awake  AEDs during EEG study: Valium  Technical aspects: This EEG study was done with ceribell EEG system. Electrical activity was reviewed with a high frequency filter of 70Hz  and a low frequency filter of 1Hz . EEG data were recorded continuously and digitally stored.   Description: The posterior dominant rhythm consists of 9 Hz activity of moderate voltage (25-35 uV) seen predominantly in posterior head regions, symmetric and reactive to eye opening and eye closing. EEG showed cintermittent generalized 3 to 6 Hz theta-delta slowing. Hyperventilation and photic stimulation were not performed.     ABNORMALITY - Intermittent slow, generalized  IMPRESSION: This study is suggestive of mild diffuse encephalopathy, nonspecific etiology. No seizures or epileptiform discharges were seen throughout the recording.  Andromeda Poppen Barbra Sarks

## 2020-09-11 NOTE — Progress Notes (Signed)
EEG Headband in place 0932  Pre EEG skin integrity Clear dry and intact

## 2020-09-11 NOTE — Progress Notes (Signed)
Primary cardiologist :  Gayla Doss   Subjective:  Delirium   Objective:  Vitals:   09/11/20 0634 09/11/20 0642 09/11/20 0700 09/11/20 0758  BP:      Pulse: (!) 108 95 97   Resp: 15 15 13    Temp:    98.4 F (36.9 C)  TempSrc:      SpO2: 99% 95% 96%   Weight:      Height:        Intake/Output from previous day:  Intake/Output Summary (Last 24 hours) at 09/11/2020 0805 Last data filed at 09/11/2020 7782 Gross per 24 hour  Intake 2995.54 ml  Output 675 ml  Net 2320.54 ml    Physical Exam: Agitated Lungs clear No murmur  Abdomen soft No edema   Lab Results: Basic Metabolic Panel: Recent Labs    09/10/20 1629 09/11/20 0325  NA 141 140  K 3.6 3.5  CL 108 106  CO2 23 25  GLUCOSE 72 103*  BUN 48* 49*  CREATININE 1.98* 1.62*  CALCIUM 8.9 8.8*  MG  --  2.1  PHOS  --  3.9   Liver Function Tests: Recent Labs    09/10/20 0926 09/11/20 0325  AST 64* 109*  ALT 37 48*  ALKPHOS 46 50  BILITOT 1.8* 1.6*  PROT 5.8* 6.2*  ALBUMIN 3.4* 3.7   No results for input(s): LIPASE, AMYLASE in the last 72 hours. CBC: Recent Labs    09/08/20 2308 09/08/20 2339 09/10/20 0926 09/11/20 0325  WBC 15.7*  --  20.0* 16.8*  NEUTROABS 14.0*  --   --  13.9*  HGB 19.0*   < > 17.6* 17.4*  HCT 58.5*   < > 54.7* 54.4*  MCV 87.2  --  88.8 89.3  PLT 200  --  210 167   < > = values in this interval not displayed.   Cardiac Enzymes: Recent Labs    09/09/20 0635 09/10/20 1108  CKTOTAL 263 2,578*   BNP: Invalid input(s): POCBNP D-Dimer: Recent Labs    09/09/20 0830  DDIMER 1.87*   Hemoglobin A1C: Thyroid Function Tests: Recent Labs    09/09/20 0635  TSH 1.094   Anemia Panel: Recent Labs    09/10/20 1108  [place] 718  FOLATE 20.7    Imaging: Imaging results have been reviewed  Cardiac Studies:  ECG: SR sinus arrhythmia 09/10/20   Telemetry:  SR with PVC  Echo: EF 55-60% mild LAE trivial MR   Medications:   . [START ON 09/12/2020] Chlorhexidine  Gluconate Cloth  6 each Topical Q0600  . diazepam  5 mg Intravenous Q4H  . insulin aspart  0-9 Units Subcutaneous Q4H  . mouth rinse  15 mL Mouth Rinse BID  . mupirocin ointment  1 application Nasal BID  . nitroGLYCERIN  1 inch Topical Q8H  . thiamine injection  100 mg Intravenous Daily     . sodium chloride    . sodium chloride    . dextrose 50 mL/hr at 09/11/20 0642  . heparin 1,100 Units/hr (09/11/20 0642)    Assessment/Plan:  Jason Hartman is a 85 y.o. male with a hx of OSA, Parkinson's disease, CKD, IDDM who is being seen 09/10/2020 for the evaluation of atrial flutter at the request of Dr. Florene Glen  1. PAF:  Echo with no structural heart disease Arrhythmia driven by neurologic issues, panic attacks anxiety and ? serotonin syndrome with polypharmacy Per Dr Gardiner Rhyme started on heparin transition to [place] when more stable CT head negative  W/u for neuro per primary service NSR this am Start low dose amiodarone and beta blocker PO intake may be an issue  Jason Hartman 09/11/2020, 8:05 AM

## 2020-09-11 NOTE — Progress Notes (Signed)
PROGRESS NOTE    Jason Hartman  OHY:[id number] DOB: 28-Mar-1936 DOA: 09/08/2020 PCP: [name], MD   Chief Complaint  Patient presents with  . Anxiety  . Nausea   Brief Narrative:   Jason Hartman is Jason Hartman 85 y.o. male with medical history significant for OSA, Parkinson's, restless leg syndrome, anxiety, chronic pain, chronic kidney disease, and insulin-dependent diabetes mellitus, now presenting to the emergency department for evaluation of anxiety and diaphoresis.  Patient has Jason Hartman long history of anxiety and per report of his family, has been increasingly anxious over the past couple weeks, has been saying that he feels like he is going to die, and has been sweating profusely.  Patient states that he feels "horrible," but has difficulty providing any more specificity though he denies chest pain, abdominal pain, flank pain, cough, dysuria, vomiting, or diarrhea.  He also denies hallucinations or suicidal ideation.  Patient states that he recently was told to stop taking Xanax.  He states that he was started on Jason Hartman different medication for anxiety but does not remember the name of it.  Family reported that the patient has not been taking his medications recently.  He's been admitted with acute metabolic encephalopathy/agitated delirium with unknown etiology.  Recently started on tramadol, celexa, buspar (which would be concerning for serotonin syndrome, though physical exam not c/w this) vs medication withdrawal (opiate/etoh/benzo? - utox negative at presentation) vs other causes.  He was transferred to stepdown overnight on 6/4-5 with worsening delirium and hemodynamic instability.    Assessment & Plan:   Principal Problem:   Anxiety Active Problems:   Type II diabetes mellitus (HCC)   OBSTRUCTIVE SLEEP APNEA   Parkinsonism (HCC)   Chronic kidney disease, stage 3a (HCC)   Malignant HTN with heart disease, w/o CHF, w/o chronic kidney disease   Elevated troponin   SIRS (systemic inflammatory  response syndrome) (HCC)   Acute metabolic encephalopathy  # Acute Metabolic Encephalopathy - etiology unclear - serotonin syndrome (exam not c/w this) vs polypharmacy vs withdrawal (opiate/benzo/etoh - denies etoh hx, utox negative at presentation), vs infection or other causes - under evaluation at this time - appreciate neurology evaluation  - suspected multifacorial etiology -> planning for EEG, recommending LP   - head CT without acute intracranial abnormality (see report)  - appreciate PCCM evaluation and assistance as well   - recommending repeat CK (elevated), free t4 (elevated with normal TSH), CMP, cortisol (35.5), b12 (wnl), RPR (pending) - precedex is off at this time, avoid typical antipsychotics - scheduled/prn benzos - will try to limit opiates as able as he's currently obtunded from sedating meds - follow ammonia (wnl), b12 (wnl), folate (wnl), RPR (pending) - vitamin b12 pending - pending urine metanephrines and catecholamines - follow blood cx/urine cx, repeat blood cultures, procalcitonin - sed rate wnl, CRP wnl - follow repeat inflammatory markers  # Systemic Inflammatory Response Syndrome - leukocytosis, tachycardia, tachypnea  - remains afebrile - no clear infection at this point - follow procalcitonin and repeat inflammatory markers today - suspect this is 2/2 underlying cause for above  # Hypotension  - s/p IVF bolus, transiently on levophed - follow blood pressures - overall improved - Velma Hanna line in place 6/5, will d/c when able -> potentially d/c today if able - caution with narcotics   4. OSA  - Continue CPAP qHS   - consider nasal trumpet - caution with narcotics  # Atrial Flutter - echo 6/4 with EF 55-60%, no regional WMA.  Normal RVSF.   - continue diltiazem  - follow troponins - started on heparin  - cardiology c/s, appreciate recommendations - arrhythmia driven by neurologic issues - recommending low dose amio and beta blocker   # Staph Hominis  Bacteremia - suspect contaminant (in 1/2 culture sets) - follow repeat cultures from 6/5  1. Anxiety  Panic Attacks  - Patient has hx of anxiety and has been increasingly anxious, diaphoretic, and reporting that he feels like he is dying for roughly 2 weeks now - suspect related to above - Patient reports recently stopping Xanax and benzodiazepine withdrawal was considered but he has not been prescribed Xanax since Aug 2020 per database review  - Check TSH (wnl), urine metanephrines and catecholamines  - anxiolytics as above  3. Elevated troponin  Elevated D dimer  - troponin rising yesterday - likely demand in setting of tachyarrhythmia - echo is without wall motion abnormalities, EF 55-60% (see report) - follow LE [place] (negative), VQ scan is low prob for PE - no ischemic eval recommended per cards  5. Parkinsonism - per neurology, likely secondary parkinsonism and not true PD - resume neupro   # RLS - holding gabapentin  6. Acute Kidney Injury on CKD IIIa  - worsening today, suspect related to dehydration/volume depletion with hypotension above - follow with IVF - strict I/O, daily weights  7. Insulin-dependent DM  Hypoglycemia  - A1c was 6.5% in May 2022  - holding basal insulin at this time - D10 for now - improved  # Elevated Bilirubin  Elevated LFT's - RUQ US - wnl - hemodynamically mediated with hypotension yesterday? Follow   # Hypertension - resume diltiazem   DVT prophylaxis: heparin  Code Status: full  Family Communication: discussed with wife 6/5  Disposition:   Status is: Observation  The patient remains OBS appropriate and will d/c before 2 midnights.  Dispo: The patient is from: Home              Anticipated d/c is to: Home              Patient currently is not medically stable to d/c.   Difficult to place patient No       Consultants:   none  Procedures:  Echo IMPRESSIONS    1. Left ventricular ejection fraction, by estimation,  is 55 to 60%. The  left ventricle has normal function. The left ventricle has no regional  wall motion abnormalities. There is mild left ventricular hypertrophy.  Left ventricular diastolic parameters  are consistent with Grade I diastolic dysfunction (impaired relaxation).  2. Right ventricular systolic function is normal. The right ventricular  size is normal. Tricuspid regurgitation signal is inadequate for assessing  PA pressure.  3. Left atrial size was mildly dilated.  4. The mitral valve is grossly normal. Trivial mitral valve  regurgitation.  5. The aortic valve is tricuspid. There is mild calcification of the  aortic valve. Aortic valve regurgitation is trivial. Mild aortic valve  sclerosis is present, with no evidence of aortic valve stenosis.  Fantasy Donald line 6/5 Antimicrobials:  Anti-infectives (From admission, onward)   Start     Dose/Rate Route Frequency Ordered Stop   09/09/20 0115  aztreonam (AZACTAM) 1 g in sodium chloride 0.9 % 100 mL IVPB        1 g 200 mL/hr over 30 Minutes Intravenous  Once 09/09/20 0106 09/09/20 0307         Subjective: obtunded  Objective: Vitals:   09/11/20  9741 09/11/20 0700 09/11/20 0758 09/11/20 0800  BP:      Pulse: 95 97  96  Resp: 15 13  13   Temp:   98.4 F (36.9 C)   TempSrc:      SpO2: 95% 96%  97%  Weight:      Height:        Intake/Output Summary (Last 24 hours) at 09/11/2020 0855 Last data filed at 09/11/2020 6384 Gross per 24 hour  Intake 2995.54 ml  Output 675 ml  Net 2320.54 ml   Filed Weights   09/08/20 2205 09/11/20 0418  Weight: 80.7 kg 83.8 kg    Examination:  General: No acute distress. Cardiovascular: Heart sounds show Jim Lundin regular rate, and rhythm.  Lungs: Clear to auscultation bilaterally  Abdomen: Soft, nontender, nondistended  Neurological: opens eyes to physical stimuli, doesn't speak, follows commands to squeeze fingers and wiggle toes. Moves all extremities 4 . Cranial nerves II through XII grossly  intact. Extremities: No clubbing or cyanosis. No edema.       Data Reviewed: I have personally reviewed following labs and imaging studies  CBC: Recent Labs  Lab 09/08/20 2308 09/08/20 2339 09/10/20 0926 09/11/20 0325  WBC 15.7*  --  20.0* 16.8*  NEUTROABS 14.0*  --   --  13.9*  HGB 19.0* 20.1* 17.6* 17.4*  HCT 58.5* 59.0* 54.7* 54.4*  MCV 87.2  --  88.8 89.3  PLT 200  --  210 536    Basic Metabolic Panel: Recent Labs  Lab 09/08/20 2339 09/10/20 0926 09/10/20 1629 09/11/20 0325  NA 134* 143 141 140  K 4.3 4.2 3.6 3.5  CL 98 108 108 106  CO2  --  24 23 25   GLUCOSE 308* 74 72 103*  BUN 39* 41* 48* 49*  CREATININE 1.30* 2.20* 1.98* 1.62*  CALCIUM  --  9.3 8.9 8.8*  MG  --   --   --  2.1  PHOS  --   --   --  3.9    GFR: Estimated Creatinine Clearance: 34.3 mL/min (Laekyn Rayos) (by C-G formula based on SCr of 1.62 mg/dL (H)).  Liver Function Tests: Recent Labs  Lab 09/08/20 2308 09/10/20 0926 09/11/20 0325  AST 27 64* 109*  ALT 32 37 48*  ALKPHOS 65 46 50  BILITOT 2.7* 1.8* 1.6*  PROT 7.6 5.8* 6.2*  ALBUMIN 4.3 3.4* 3.7    CBG: Recent Labs  Lab 09/10/20 1828 09/10/20 1934 09/10/20 2248 09/11/20 0301 09/11/20 0726  GLUCAP 72 75 85 99 145*     Recent Results (from the past 240 hour(s))  Urine culture     Status: Abnormal   Collection Time: 09/08/20 11:35 PM   Specimen: Urine, Clean Catch  Result Value Ref Range Status   Specimen Description   Final    URINE, CLEAN CATCH Performed at [facility], [place] [address] [place], [place] [address]    Special Requests   Final    NONE Performed at [facility], [place] [address] [place], [place] [address]    Culture (Chiyeko Ferre)  Final    <10,000 COLONIES/mL INSIGNIFICANT GROWTH Performed at [facility] [address], [place], [place] [address]    Report Status 09/10/2020 FINAL  Final  Resp Panel by RT-PCR (Flu Bao Coreas&B, Covid) Nasopharyngeal Swab     Status: None    Collection Time: 09/08/20 11:41 PM   Specimen: Nasopharyngeal Swab; Nasopharyngeal(NP) swabs in vial transport medium  Result Value Ref Range Status  SARS Coronavirus 2 by RT PCR NEGATIVE NEGATIVE Final    Comment: (NOTE) SARS-CoV-2 target nucleic acids are NOT DETECTED.  The SARS-CoV-2 RNA is generally detectable in upper respiratory specimens during the acute phase of infection. The lowest concentration of SARS-CoV-2 viral copies this assay can detect is 138 copies/mL. Chealsey Miyamoto negative result does not preclude SARS-Cov-2 infection and should not be used as the sole basis for treatment or other patient management decisions. Ahaana Rochette negative result may occur with  improper specimen collection/handling, submission of specimen other than nasopharyngeal swab, presence of viral mutation(s) within the areas targeted by this assay, and inadequate number of viral copies(<138 copies/mL). Honestii Marton negative result must be combined with clinical observations, patient history, and epidemiological information. The expected result is Negative.  Fact Sheet for Patients:  EntrepreneurPulse.com.au  Fact Sheet for Healthcare Providers:  IncredibleEmployment.be  This test is no t yet approved or cleared by the [place] FDA and  has been authorized for detection and/or diagnosis of SARS-CoV-2 by FDA under an Emergency Use Authorization (EUA). This EUA will remain  in effect (meaning this test can be used) for the duration of the COVID-19 declaration under Section 564(b)(1) of the Act, 21 U.S.C.section 360bbb-3(b)(1), unless the authorization is terminated  or revoked sooner.       Influenza Abiageal Blowe by PCR NEGATIVE NEGATIVE Final   Influenza B by PCR NEGATIVE NEGATIVE Final    Comment: (NOTE) The Xpert Xpress SARS-CoV-2/FLU/RSV plus assay is intended as an aid in the diagnosis of influenza from Nasopharyngeal swab specimens and should not be used as Aby Gessel sole basis for treatment.  Nasal washings and aspirates are unacceptable for Xpert Xpress SARS-CoV-2/FLU/RSV testing.  Fact Sheet for Patients: EntrepreneurPulse.com.au  Fact Sheet for Healthcare Providers: IncredibleEmployment.be  This test is not yet approved or cleared by the [place] FDA and has been authorized for detection and/or diagnosis of SARS-CoV-2 by FDA under an Emergency Use Authorization (EUA). This EUA will remain in effect (meaning this test can be used) for the duration of the COVID-19 declaration under Section 564(b)(1) of the Act, 21 U.S.C. section 360bbb-3(b)(1), unless the authorization is terminated or revoked.  Performed at [facility], [place] [address] [place], [place] [address]   Blood culture (routine x 2)     Status: Abnormal (Preliminary result)   Collection Time: 09/09/20  1:03 AM   Specimen: BLOOD  Result Value Ref Range Status   Specimen Description   Final    BLOOD RIGHT WRIST Performed at [facility] [address], [place], [place] [address]    Special Requests   Final    BOTTLES DRAWN AEROBIC AND ANAEROBIC Blood Culture adequate volume Performed at [facility] [address] [place], [place] [address]    Culture  Setup Time   Final    GRAM POSITIVE COCCI IN CLUSTERS IN BOTH AEROBIC AND ANAEROBIC BOTTLES CRITICAL RESULT CALLED TO, READ BACK BY AND VERIFIED WITH: PHARMD MICHELLE LILLISTAN 09/09/2020 AT 2220 Ruthanne [email or web address]    Culture (Shadeed Colberg)  Final    STAPHYLOCOCCUS HOMINIS CULTURE REINCUBATED FOR BETTER GROWTH Performed at [facility], [place] [address], [place], [place] [address]    Report Status PENDING  Incomplete  Blood Culture ID Panel (Reflexed)     Status: Abnormal   Collection Time: 09/09/20  1:03 AM  Result Value Ref Range Status   Enterococcus faecalis NOT DETECTED NOT DETECTED Final   Enterococcus Faecium NOT DETECTED NOT DETECTED Final   Listeria  monocytogenes NOT DETECTED NOT DETECTED Final   Staphylococcus species DETECTED (Tramya Schoenfelder) NOT DETECTED Final    Comment: CRITICAL RESULT CALLED TO, READ BACK BY AND VERIFIED WITH: PHARMD MICHELLE LILLISTAN 09/09/2020 AT 2220 Anastyn [email or web address].[email or web address]    Staphylococcus aureus (BCID) NOT DETECTED NOT DETECTED Final   Staphylococcus epidermidis DETECTED (Oluwadarasimi Favor) NOT DETECTED Final    Comment: Methicillin (oxacillin) resistant coagulase negative staphylococcus. Possible blood culture contaminant (unless isolated from more than one blood culture draw or clinical case suggests pathogenicity). No antibiotic treatment is indicated for blood  culture contaminants. CRITICAL RESULT CALLED TO, READ BACK BY AND VERIFIED WITH: PHARMD MICHELLE LILLISTAN 09/09/2020 AT 2220 Shaft [email or web address].HUGHES    Staphylococcus lugdunensis NOT DETECTED NOT DETECTED Final   Streptococcus species NOT DETECTED NOT DETECTED Final   Streptococcus agalactiae NOT DETECTED NOT DETECTED Final   Streptococcus pneumoniae NOT DETECTED NOT DETECTED Final   Streptococcus pyogenes NOT DETECTED NOT DETECTED Final   Taym Twist.calcoaceticus-baumannii NOT DETECTED NOT DETECTED Final   Bacteroides fragilis NOT DETECTED NOT DETECTED Final   Enterobacterales NOT DETECTED NOT DETECTED Final   Enterobacter cloacae complex NOT DETECTED NOT DETECTED Final   Escherichia coli NOT DETECTED NOT DETECTED Final   Klebsiella aerogenes NOT DETECTED NOT DETECTED Final   Klebsiella oxytoca NOT DETECTED NOT DETECTED Final   Klebsiella pneumoniae NOT DETECTED NOT DETECTED Final   Proteus species NOT DETECTED NOT DETECTED Final   Salmonella species NOT DETECTED NOT DETECTED Final   Serratia marcescens NOT DETECTED NOT DETECTED Final   Haemophilus influenzae NOT DETECTED NOT DETECTED Final   Neisseria meningitidis NOT DETECTED NOT DETECTED Final   Pseudomonas aeruginosa NOT DETECTED NOT DETECTED Final   Stenotrophomonas maltophilia NOT DETECTED NOT DETECTED Final   Candida albicans NOT DETECTED NOT  DETECTED Final   Candida auris NOT DETECTED NOT DETECTED Final   Candida glabrata NOT DETECTED NOT DETECTED Final   Candida krusei NOT DETECTED NOT DETECTED Final   Candida parapsilosis NOT DETECTED NOT DETECTED Final   Candida tropicalis NOT DETECTED NOT DETECTED Final   Cryptococcus neoformans/gattii NOT DETECTED NOT DETECTED Final   Methicillin resistance mecA/C DETECTED (Verdene Creson) NOT DETECTED Final    Comment: CRITICAL RESULT CALLED TO, READ BACK BY AND VERIFIED WITH: PHARMD MICHELLE LILLISTAN 09/09/2020 AT 2220 Kahlin Mark.HUGHES Performed at [facility], [place] [address], [place], [place] [address]   Blood culture (routine x 2)     Status: None (Preliminary result)   Collection Time: 09/09/20  1:08 AM   Specimen: BLOOD  Result Value Ref Range Status   Specimen Description   Final    BLOOD LEFT WRIST Performed at [facility] [address] [place], [place] [address]    Special Requests   Final    BOTTLES DRAWN AEROBIC AND ANAEROBIC Blood Culture adequate volume Performed at [facility] [address] [place], [place] [address]    Culture   Final    NO GROWTH 2 DAYS Performed at [facility] [address], [place],  [address]    Report Status PENDING  Incomplete  MRSA PCR Screening     Status: Abnormal   Collection Time: 09/10/20  5:19 AM   Specimen: Nasal Mucosa; Nasopharyngeal  Result Value Ref Range Status   MRSA by PCR POSITIVE (Madge Therrien) NEGATIVE Final    Comment:        The GeneXpert MRSA Assay (FDA approved for NASAL specimens only), is one component of Niclas Markell comprehensive MRSA colonization surveillance program. It is not intended  to diagnose MRSA infection nor to guide or monitor treatment for MRSA infections. RESULT CALLED TO, READ BACK BY AND VERIFIED WITH: HEAVNER,Neshia Mckenzie. RN AT 1150 09/10/20 MULLINS,T Performed at [facility], [place] [address], [place], [place] [address]   Culture, blood (routine x 2)      Status: None (Preliminary result)   Collection Time: 09/10/20 11:03 AM   Specimen: BLOOD  Result Value Ref Range Status   Specimen Description   Final    BLOOD LEFT FOREARM Performed at [facility] [address] [place], [place] [address]    Special Requests   Final    BOTTLES DRAWN AEROBIC ONLY Blood Culture results may not be optimal due to an inadequate volume of blood received in culture bottles Performed at [facility] [address], [place], [place] [address]    Culture   Final    NO GROWTH < 24 HOURS Performed at [facility] [address] [place], [place] [address]    Report Status PENDING  Incomplete  Culture, blood (routine x 2)     Status: None (Preliminary result)   Collection Time: 09/10/20 11:03 AM   Specimen: BLOOD  Result Value Ref Range Status   Specimen Description   Final    BLOOD RIGHT ANTECUBITAL Performed at [facility] [address], [place], [place] [address]    Special Requests   Final    BOTTLES DRAWN AEROBIC AND ANAEROBIC Blood Culture adequate volume Performed at [facility] [address], [place], [place] [address]    Culture   Final    NO GROWTH < 24 HOURS Performed at [facility] [address], [place], [place] [address]    Report Status PENDING  Incomplete         Radiology Studies: CT HEAD WO CONTRAST  Result Date: 09/10/2020 CLINICAL DATA:  Delirium, increased altered mental status EXAM: CT HEAD WITHOUT CONTRAST TECHNIQUE: Contiguous axial images were obtained from the base of the skull through the vertex without intravenous contrast. Sagittal and coronal MPR images reconstructed from axial data set. COMPARISON:  None FINDINGS: Brain: Generalized atrophy. Normal ventricular morphology. No midline shift or mass effect. Small vessel chronic ischemic changes of deep cerebral white matter. Old lacunar infarcts at basal ganglia bilaterally.  No intracranial hemorrhage, mass lesion, or evidence of acute infarction. No extra-axial fluid collections. Vascular: No hyperdense vessels. Atherosclerotic calcification of internal carotid and vertebral arteries at skull base noted. Skull: Intact Sinuses/Orbits: Small amount of fluid within sphenoid sinus. Paranasal sinuses and mastoid air cells otherwise clear. Other: N/Ricard Faulkner IMPRESSION: Atrophy with small vessel chronic ischemic changes of deep cerebral white matter. Old lacunar infarcts at basal ganglia bilaterally. No acute intracranial abnormalities. Small amount of nonspecific fluid within sphenoid spinous. Electronically Signed   By: Lavonia Dana M.D.   On: 09/10/2020 15:32   NM Pulmonary Perfusion  Result Date: 09/09/2020 CLINICAL DATA:  Elevated D-dimer, rule out PE EXAM: NUCLEAR MEDICINE PERFUSION LUNG SCAN TECHNIQUE: Perfusion images were obtained in multiple projections after intravenous injection of radiopharmaceutical. Ventilation scans intentionally deferred if perfusion scan and chest x-ray adequate for interpretation during COVID 19 epidemic. RADIOPHARMACEUTICALS:  4.1 mCi Tc-65m MAA IV COMPARISON:  Chest radiograph, 09/08/2020 FINDINGS: Normal, homogeneous perfusion of the lungs bilaterally. No suspicious perfusion defects. IMPRESSION: Very low probability examination for pulmonary embolism by modified perfusion only PIOPED criteria (PE absent). Electronically Signed   By: Dorna Bloom.D.  On: 09/09/2020 17:18   DG Chest Port 1 View  Result Date: 09/10/2020 CLINICAL DATA:  Increased shortness of breath EXAM: PORTABLE CHEST 1 VIEW COMPARISON:  Portable exam 0932 hours compared to 09/08/2020 FINDINGS: Enlargement of cardiac silhouette. Atherosclerotic calcification aorta. Mediastinal contours and pulmonary vascularity normal. Decreased lung volumes with bibasilar atelectasis. Upper lungs clear. No pleural effusion or pneumothorax. Advanced LEFT glenohumeral degenerative changes with calcified  loose bodies. IMPRESSION: Decreased lung volumes with bibasilar atelectasis. Enlargement of cardiac silhouette. Aortic Atherosclerosis (ICD10-I70.0). Electronically Signed   By: Lavonia Dana M.D.   On: 09/10/2020 10:13   DG Foot 2 Views Left  Result Date: 09/10/2020 CLINICAL DATA:  LEFT foot bruising. EXAM: LEFT FOOT - 2 VIEW COMPARISON:  None. FINDINGS: Osseous alignment is normal. No fracture line or displaced fracture fragment is seen. No degenerative change. Adjacent soft tissues are unremarkable. IMPRESSION: Negative. Electronically Signed   By: Franki Cabot M.D.   On: 09/10/2020 16:44   ECHOCARDIOGRAM COMPLETE  Result Date: 09/09/2020    ECHOCARDIOGRAM REPORT   Patient Name:   Jason Hartman Date of Exam: 09/09/2020 Medical Rec #:  [id number]     Height:       64.0 in Accession #:    [id number]    Weight:       178.0 lb Date of Birth:  [birth year]    BSA:          1.862 m Patient Age:    77 years      BP:           155/91 mmHg Patient Gender: M             HR:           104 bpm. Exam Location:  Inpatient Procedure: 2D Echo, Cardiac Doppler and Color Doppler Indications:    Other abnormalities of the heart R00.8  History:        Patient has prior history of Echocardiogram examinations, most                 recent 04/27/2019. Risk Factors:Hypertension. Patient has been                 sweating profusely for four months without fever per patient.  Sonographer:    Merrie Roof RDCS Referring Phys: [place]  1. Left ventricular ejection fraction, by estimation, is 55 to 60%. The left ventricle has normal function. The left ventricle has no regional wall motion abnormalities. There is mild left ventricular hypertrophy. Left ventricular diastolic parameters are consistent with Grade I diastolic dysfunction (impaired relaxation).  2. Right ventricular systolic function is normal. The right ventricular size is normal. Tricuspid regurgitation signal is inadequate for assessing PA pressure.   3. Left atrial size was mildly dilated.  4. The mitral valve is grossly normal. Trivial mitral valve regurgitation.  5. The aortic valve is tricuspid. There is mild calcification of the aortic valve. Aortic valve regurgitation is trivial. Mild aortic valve sclerosis is present, with no evidence of aortic valve stenosis. FINDINGS  Left Ventricle: Left ventricular ejection fraction, by estimation, is 55 to 60%. The left ventricle has normal function. The left ventricle has no regional wall motion abnormalities. The left ventricular internal cavity size was normal in size. There is  mild left ventricular hypertrophy. Left ventricular diastolic parameters are consistent with Grade I diastolic dysfunction (impaired relaxation). Right Ventricle: The right ventricular size is normal. No increase in right ventricular wall thickness.  Right ventricular systolic function is normal. Tricuspid regurgitation signal is inadequate for assessing PA pressure. Left Atrium: Left atrial size was mildly dilated. Right Atrium: Right atrial size was normal in size. Pericardium: There is no evidence of pericardial effusion. Mitral Valve: The mitral valve is grossly normal. Mild mitral annular calcification. Trivial mitral valve regurgitation. Tricuspid Valve: The tricuspid valve is grossly normal. Tricuspid valve regurgitation is trivial. Aortic Valve: The aortic valve is tricuspid. There is mild calcification of the aortic valve. There is mild aortic valve annular calcification. Aortic valve regurgitation is trivial. Mild aortic valve sclerosis is present, with no evidence of aortic valve stenosis. Pulmonic Valve: The pulmonic valve was grossly normal. Pulmonic valve regurgitation is trivial. Aorta: The aortic root is normal in size and structure. IAS/Shunts: The interatrial septum appears to be lipomatous. No atrial level shunt detected by color flow Doppler.  LEFT VENTRICLE PLAX 2D LVIDd:         3.30 cm  Diastology LVIDs:         2.80  cm  LV e' medial:    5.44 cm/s LV PW:         1.30 cm  LV E/e' medial:  13.2 LV IVS:        1.10 cm  LV e' lateral:   8.05 cm/s LVOT diam:     2.00 cm  LV E/e' lateral: 8.9 LV SV:         46 LV SV Index:   25 LVOT Area:     3.14 cm  LEFT ATRIUM             Index LA diam:        4.00 cm 2.15 cm/m LA Vol (A2C):   59.8 ml 32.12 ml/m LA Vol (A4C):   65.6 ml 35.23 ml/m LA Biplane Vol: 65.1 ml 34.97 ml/m  AORTIC VALVE LVOT Vmax:   102.00 cm/s LVOT Vmean:  73.500 cm/s LVOT VTI:    0.146 m  AORTA Ao Root diam: 3.60 cm MITRAL VALVE MV Area (PHT): 3.53 cm     SHUNTS MV Decel Time: 215 msec     Systemic VTI:  0.15 m MV E velocity: 72.00 cm/s   Systemic Diam: 2.00 cm MV Kalvin Buss velocity: 116.00 cm/s MV E/Sharea Guinther ratio:  0.62 Rozann Lesches MD Electronically signed by Rozann Lesches MD Signature Date/Time: 09/09/2020/1:20:33 PM    Final    VAS [place] LOWER EXTREMITY VENOUS (DVT)  Result Date: 09/10/2020  Lower Venous DVT Study Patient Name:  Jason Hartman  Date of Exam:   09/09/2020 Medical Rec #: [id number]      Accession #:    [id number] Date of Birth: [birth year]     Patient Gender: M Patient Age:   84Y Exam Location:  [facility] Procedure:      VAS [place] LOWER EXTREMITY VENOUS (DVT) Referring Phys: [id number] Khye Hochstetler CALDWELL POWELL JR --------------------------------------------------------------------------------  Indications: Elevated Ddimer.  Risk Factors: None identified. Limitations: Poor ultrasound/tissue interface. Comparison Study: No prior studies. Performing Technologist: Oliver Hum RVT  Examination Guidelines: Malyn Aytes complete evaluation includes B-mode imaging, spectral Doppler, color Doppler, and power Doppler as needed of all accessible portions of each vessel. Bilateral testing is considered an integral part of Airam Runions complete examination. Limited examinations for reoccurring indications may be performed as noted. The reflux portion of the exam is performed with the patient in reverse Trendelenburg.   +---------+---------------+---------+-----------+----------+--------------+ RIGHT    CompressibilityPhasicitySpontaneityPropertiesThrombus Aging +---------+---------------+---------+-----------+----------+--------------+ CFV      Full  Yes      Yes                                 +---------+---------------+---------+-----------+----------+--------------+ SFJ      Full                                                        +---------+---------------+---------+-----------+----------+--------------+ FV Prox  Full                                                        +---------+---------------+---------+-----------+----------+--------------+ FV Mid   Full                                                        +---------+---------------+---------+-----------+----------+--------------+ FV DistalFull                                                        +---------+---------------+---------+-----------+----------+--------------+ PFV      Full                                                        +---------+---------------+---------+-----------+----------+--------------+ POP      Full           Yes      Yes                                 +---------+---------------+---------+-----------+----------+--------------+ PTV      Full                                                        +---------+---------------+---------+-----------+----------+--------------+ PERO     Full                                                        +---------+---------------+---------+-----------+----------+--------------+   +---------+---------------+---------+-----------+----------+--------------+ LEFT     CompressibilityPhasicitySpontaneityPropertiesThrombus Aging +---------+---------------+---------+-----------+----------+--------------+ CFV      Full           Yes      Yes                                  +---------+---------------+---------+-----------+----------+--------------+ SFJ  Full                                                        +---------+---------------+---------+-----------+----------+--------------+ FV Prox  Full                                                        +---------+---------------+---------+-----------+----------+--------------+ FV Mid   Full                                                        +---------+---------------+---------+-----------+----------+--------------+ FV DistalFull                                                        +---------+---------------+---------+-----------+----------+--------------+ PFV      Full                                                        +---------+---------------+---------+-----------+----------+--------------+ POP      Full           Yes      Yes                                 +---------+---------------+---------+-----------+----------+--------------+ PTV      Full                                                        +---------+---------------+---------+-----------+----------+--------------+ PERO     Full                                                        +---------+---------------+---------+-----------+----------+--------------+     Summary: RIGHT: - There is no evidence of deep vein thrombosis in the lower extremity.  - No cystic structure found in the popliteal fossa.  LEFT: - There is no evidence of deep vein thrombosis in the lower extremity.  - No cystic structure found in the popliteal fossa.  *See table(s) above for measurements and observations. Electronically signed by Ruta Hinds MD on 09/10/2020 at 5:10:11 PM.    Final    [place] EKG SITE RITE  Result Date: 09/10/2020 If Site Rite image not attached, placement could not be confirmed due to current cardiac rhythm.  US Abdomen Limited RUQ (LIVER/GB)  Result Date: 09/09/2020 CLINICAL DATA:  Increased bilirubin.  EXAM: ULTRASOUND ABDOMEN LIMITED RIGHT UPPER QUADRANT COMPARISON:  CT 02/19/2020 FINDINGS: Gallbladder: No gallstones or wall thickening visualized. No sonographic Murphy sign noted by sonographer. Common bile duct: Diameter: Normal at 2 mm Liver: No focal lesion identified. Within normal limits in parenchymal echogenicity. Portal vein is patent on color Doppler imaging with normal direction of blood flow towards the liver. Other: None. IMPRESSION: 1. Normal gallbladder and common bile duct. 2. No biliary duct dilatation. Electronically Signed   By: Suzy Bouchard M.D.   On: 09/09/2020 15:56        Scheduled Meds: . amiodarone  200 mg Oral Daily  . [START ON 09/12/2020] Chlorhexidine Gluconate Cloth  6 each Topical Q0600  . diazepam  5 mg Intravenous Q4H  . insulin aspart  0-9 Units Subcutaneous Q4H  . mouth rinse  15 mL Mouth Rinse BID  . metoprolol succinate  50 mg Oral Daily  . mupirocin ointment  1 application Nasal BID  . nitroGLYCERIN  1 inch Topical Q8H  . thiamine injection  100 mg Intravenous Daily   Continuous Infusions: . sodium chloride    . sodium chloride    . clevidipine    . dextrose 50 mL/hr at 09/11/20 0642  . heparin 1,100 Units/hr (09/11/20 0642)  . potassium chloride       LOS: 1 day    Time spent: over 30 min 35 min critical care with acute metabolic encephalopathy    Fayrene Helper, MD [facility]   To contact the attending provider between 7A-7P or the covering provider during after hours 7P-7A, please log into the web site [email or web address] and access using universal [place] password for that web site. If you do not have the password, please call the hospital operator.  09/11/2020, 8:55 AM

## 2020-09-11 NOTE — Consult Note (Signed)
NAME:  Jason Hartman, MRN:  [id number], DOB:  11/30/1935, LOS: 1 ADMISSION DATE:  09/08/2020, CONSULTATION DATE:  09/10/20 REFERRING MD:  Florene Glen, CHIEF COMPLAINT:  anxiety   History of Present Illness:  Jason Hartman is a 85 y.o. male with medical history significant for OSA, Parkinson's, restless leg syndrome, anxiety, chronic pain, chronic kidney disease, and insulin-dependent diabetes mellitus, now presenting to the emergency department for evaluation of anxiety and diaphoresis. He was admitted for further workup of panic, SIRS of unclear etiology on whom we were consulted for worsening agitation, hyperactive delirium failing to respond to precedex, IV haldol.   Hospital course: Upon arrival to the ED, patient was found to be afebrile, saturating mid 90s on room air, tachycardic to 120, and with stable blood pressure. EKG features sinus tachycardia with PVCs. Chest x-ray with mild stable right basilar scar or atelectasis. Chemistry panel notable for glucose 308, bilirubin 2.7, and creatinine 1.30. CBC features a leukocytosis to 15,700 and polycythemia with hemoglobin 19.0. High-sensitivity troponin was 28 and 32. Urinalysis with glucosuria, ketonuria, microscopic hematuria, and proteinuria. Blood cultures were collected in the emergency department and the patient was given IV fluids and Azactam. On admission, ABX were held without clear evidence of underlying infectious process, and he was given prn ativan and haldol for agitation intermittently.  This morning his agitation acutely worsened despite trial of 4mg  ativan, 12mg  haldol and precedex.  Pertinent  Medical History  Parkinson's disease OSA RLS IDDM  Significant Hospital Events: Including procedures, antibiotic start and stop dates in addition to other pertinent events   .   Interim History / Subjective:  Off Precedex > 24 hours. Remains encephalopathic. Intermittently hypertensive with agitation  Objective   Blood pressure (!)  107/42, pulse 96, temperature 98.4 F (36.9 C), resp. rate 13, height 5\' 4"  (1.626 m), weight 83.8 kg, SpO2 97 %.        Intake/Output Summary (Last 24 hours) at 09/11/2020 0839 Last data filed at 09/11/2020 7622 Gross per 24 hour  Intake 2995.54 ml  Output 675 ml  Net 2320.54 ml   Filed Weights   09/08/20 2205 09/11/20 0418  Weight: 80.7 kg 83.8 kg   Physical Exam: General: Elderly and chronically ill-appearing, encephalopathic HENT: [place], AT, OP clear, MMM Eyes: EOMI, no scleral icterus Respiratory: Clear to auscultation bilaterally.  No crackles, wheezing or rales Cardiovascular: RRR, -M/R/G, no JVD GI: BS+, soft, nontender Extremities:-Edema,-tenderness Neuro: Encephalopathic, intermittently opens eyes to touch, does not follow commands, moves extremities x 4 spontaneously, no neck or limb rigidity   Labs/imaging that I havepersonally reviewed  (right click and "Reselect all SmartList Selections" daily)   6/3 CXR clear   6/4 abdominal US unremarkable  6/4 V/Q scan low probability PE  6/4 [place] BLE DVT negative  Bun/Cr 49/1.62 - improving WBC 16.8 - improving Hg 17 - ?hemoconcentrated  Resolved Hospital Problem list   n/a  Assessment & Plan:   # Acute encephalopathy: Complicated by polypharmacy, delirium, withdrawal, ?NMS, underlying anxiety and RLS refractory to treatment. Possible hypertensive encephalopathy with BP elevated to >200s intermittently CK - 2578, free T4 slightly elevated, random cortisol appropriate, RPR pending, B12 wnl - Off Precedex - Appreciate Neuro recs. Primary team plans for LP with IR - Rapid [facility] available at [facility] (will notify [employer] rep) - Scheduled benzos (5 mg Q 4 of valium) for agitation in this setting and avoid typical antipsychotics -Scheduled dilaudid -Plan for enteral access -When enteral access attained start gabapentin back -BP  control with PRN IV anti-hypertensive agents. Consider cleviprex if persisent - Repeat CK  - Consider  MRI if work-up negative  # Tachycardia-with anxiety - Cardiology following - Telemetry  # Parkinson's disease: - Hold neuropro for now. Had two patches on on the floor and thus could have dopamine overdrive - Need to restart on 09/11/2020 AM pending patient clinical course - Stopped taking neuropro a month ago and started taking gabapentin instead.  -Start gabapentin when enteral access obtained  # Hypoglycemia: poor oral intake - holding insulin - continue d10, q4 glucose checks  - start TF when enteral access obtained  # AoCKD: Avoid nephrotoxins as able Maintenance IVF  # IDDM: - q4 checks - hold Lantus, and mealtime -Will need D10 infusion  OSA-Patient non compliant with CPAP -Patient at risk for obstructing as he comes more sedated. Need HOB at 30 degrees and than can consider nasal trumpet    Best practice (right click and "Reselect all SmartList Selections" daily)  Diet:  NPO Pain/Anxiety/Delirium protocol (if indicated):Valium and dilaudid scheduled VAP protocol (if indicated): Not indicated DVT prophylaxis: Subcutaneous Heparin GI prophylaxis: N/A Glucose control:  FSBS Q4 hours Central venous access:  N/A Arterial line:  N/A Foley:  N/A Mobility:  bed rest  PT consulted: N/A Last date of multidisciplinary goals of care discussion [09/10/20] talked with wife and she is aware of plan Code Status:  full code Disposition: Step down unit with TRH  PCCM available as needed  Labs   CBC: Recent Labs  Lab 09/08/20 2308 09/08/20 2339 09/10/20 0926 09/11/20 0325  WBC 15.7*  --  20.0* 16.8*  NEUTROABS 14.0*  --   --  13.9*  HGB 19.0* 20.1* 17.6* 17.4*  HCT 58.5* 59.0* 54.7* 54.4*  MCV 87.2  --  88.8 89.3  PLT 200  --  210 177    Basic Metabolic Panel: Recent Labs  Lab 09/08/20 2339 09/10/20 0926 09/10/20 1629 09/11/20 0325  NA 134* 143 141 140  K 4.3 4.2 3.6 3.5  CL 98 108 108 106  CO2  --  24 23 25   GLUCOSE 308* 74 72 103*  BUN 39* 41* 48* 49*   CREATININE 1.30* 2.20* 1.98* 1.62*  CALCIUM  --  9.3 8.9 8.8*  MG  --   --   --  2.1  PHOS  --   --   --  3.9   GFR: Estimated Creatinine Clearance: 34.3 mL/min (A) (by C-G formula based on SCr of 1.62 mg/dL (H)). Recent Labs  Lab 09/08/20 2308 09/10/20 0926 09/10/20 1108 09/11/20 0325  PROCALCITON  --   --  0.30 0.26  WBC 15.7* 20.0*  --  16.8*    Liver Function Tests: Recent Labs  Lab 09/08/20 2308 09/10/20 0926 09/11/20 0325  AST 27 64* 109*  ALT 32 37 48*  ALKPHOS 65 46 50  BILITOT 2.7* 1.8* 1.6*  PROT 7.6 5.8* 6.2*  ALBUMIN 4.3 3.4* 3.7   No results for input(s): LIPASE, AMYLASE in the last 168 hours. Recent Labs  Lab 09/10/20 1108  AMMONIA 23    ABG    Component Value Date/Time   PHART 7.492 (H) 09/10/2020 0625   PCO2ART 28.3 (L) 09/10/2020 0625   PO2ART 94.1 09/10/2020 0625   HCO3 21.6 09/10/2020 0625   TCO2 20 (L) 09/08/2020 2339   O2SAT 97.8 09/10/2020 0625     Coagulation Profile: Recent Labs  Lab 09/10/20 1108  INR 1.2    Cardiac Enzymes: Recent Labs  Lab 09/09/20 0635 09/10/20 1108  CKTOTAL 263 2,578*    HbA1C: Hgb A1c MFr Bld  Date/Time Value Ref Range Status  04/26/2019 06:57 PM 9.1 (H) 4.8 - 5.6 % Final    Comment:    (NOTE) Pre diabetes:          5.7%-6.4% Diabetes:              >6.4% Glycemic control for   <7.0% adults with diabetes   09/18/2006 07:58 AM 7.0 (H) 4.6 - 6.0 % Final    Comment:    See lab report for associated comment(s)    CBG: Recent Labs  Lab 09/10/20 1828 09/10/20 1934 09/10/20 2248 09/11/20 0301 09/11/20 0726  GLUCAP 72 75 85 99 145*    The patient is critically ill with acute encephalopathy and hypertensive urgency and requires high complexity decision making for assessment and support, frequent evaluation and titration of therapies, application of advanced monitoring technologies and extensive interpretation of multiple databases.  Independent Critical Care Time: 75 Minutes.   Rodman Pickle, M.D. [facility] Pulmonary/Critical Care Medicine 09/11/2020 8:40 AM   Please see Amion for pager number to reach on-call Pulmonary and Critical Care Team.

## 2020-09-12 ENCOUNTER — Encounter (INDEPENDENT_AMBULATORY_CARE_PROVIDER_SITE_OTHER): Payer: Medicare Other | Admitting: Ophthalmology

## 2020-09-12 ENCOUNTER — Encounter (HOSPITAL_COMMUNITY): Payer: Self-pay | Admitting: Family Medicine

## 2020-09-12 DIAGNOSIS — R6510 Systemic inflammatory response syndrome (SIRS) of non-infectious origin without acute organ dysfunction: Secondary | ICD-10-CM

## 2020-09-12 DIAGNOSIS — R778 Other specified abnormalities of plasma proteins: Secondary | ICD-10-CM

## 2020-09-12 LAB — CBC WITH DIFFERENTIAL/PLATELET
Abs Immature Granulocytes: 0.1 10*3/uL — ABNORMAL HIGH (ref 0.00–0.07)
Basophils Absolute: 0.1 10*3/uL (ref 0.0–0.1)
Basophils Relative: 0 %
Eosinophils Absolute: 0.1 10*3/uL (ref 0.0–0.5)
Eosinophils Relative: 1 %
HCT: 48.9 % (ref 39.0–52.0)
Hemoglobin: 15.1 g/dL (ref 13.0–17.0)
Immature Granulocytes: 1 %
Lymphocytes Relative: 7 %
Lymphs Abs: 0.9 10*3/uL (ref 0.7–4.0)
MCH: 28.6 pg (ref 26.0–34.0)
MCHC: 30.9 g/dL (ref 30.0–36.0)
MCV: 92.6 fL (ref 80.0–100.0)
Monocytes Absolute: 0.9 10*3/uL (ref 0.1–1.0)
Monocytes Relative: 7 %
Neutro Abs: 11.5 10*3/uL — ABNORMAL HIGH (ref 1.7–7.7)
Neutrophils Relative %: 84 %
Platelets: 152 10*3/uL (ref 150–400)
RBC: 5.28 MIL/uL (ref 4.22–5.81)
RDW: 14.3 % (ref 11.5–15.5)
WBC: 13.6 10*3/uL — ABNORMAL HIGH (ref 4.0–10.5)
nRBC: 0 % (ref 0.0–0.2)

## 2020-09-12 LAB — COMPREHENSIVE METABOLIC PANEL
ALT: 38 U/L (ref 0–44)
AST: 49 U/L — ABNORMAL HIGH (ref 15–41)
Albumin: 2.9 g/dL — ABNORMAL LOW (ref 3.5–5.0)
Alkaline Phosphatase: 38 U/L (ref 38–126)
Anion gap: 7 (ref 5–15)
BUN: 42 mg/dL — ABNORMAL HIGH (ref 8–23)
CO2: 23 mmol/L (ref 22–32)
Calcium: 8.2 mg/dL — ABNORMAL LOW (ref 8.9–10.3)
Chloride: 102 mmol/L (ref 98–111)
Creatinine, Ser: 1.53 mg/dL — ABNORMAL HIGH (ref 0.61–1.24)
GFR, Estimated: 45 mL/min — ABNORMAL LOW (ref >60)
Glucose, Bld: 164 mg/dL — ABNORMAL HIGH (ref 70–99)
Potassium: 3.9 mmol/L (ref 3.5–5.1)
Sodium: 132 mmol/L — ABNORMAL LOW (ref 135–145)
Total Bilirubin: 2 mg/dL — ABNORMAL HIGH (ref 0.3–1.2)
Total Protein: 5.2 g/dL — ABNORMAL LOW (ref 6.5–8.1)

## 2020-09-12 LAB — GLUCOSE, CAPILLARY
Glucose-Capillary: 143 mg/dL — ABNORMAL HIGH (ref 70–99)
Glucose-Capillary: 158 mg/dL — ABNORMAL HIGH (ref 70–99)
Glucose-Capillary: 222 mg/dL — ABNORMAL HIGH (ref 70–99)
Glucose-Capillary: 227 mg/dL — ABNORMAL HIGH (ref 70–99)
Glucose-Capillary: 248 mg/dL — ABNORMAL HIGH (ref 70–99)

## 2020-09-12 LAB — URINALYSIS, ROUTINE W REFLEX MICROSCOPIC
Bilirubin Urine: NEGATIVE
Glucose, UA: >=500 mg/dL — AB
Ketones, ur: NEGATIVE mg/dL
Nitrite: NEGATIVE
Protein, ur: NEGATIVE mg/dL
Specific Gravity, Urine: 1.005 (ref 1.005–1.030)
WBC, UA: >50 WBC/hpf — ABNORMAL HIGH (ref 0–5)
pH: 6 (ref 5.0–8.0)

## 2020-09-12 LAB — PHOSPHORUS: Phosphorus: 3 mg/dL (ref 2.5–4.6)

## 2020-09-12 LAB — MAGNESIUM: Magnesium: 1.9 mg/dL (ref 1.7–2.4)

## 2020-09-12 LAB — HEPARIN LEVEL (UNFRACTIONATED)
Heparin Unfractionated: 0.16 IU/mL — ABNORMAL LOW (ref 0.30–0.70)
Heparin Unfractionated: 0.21 IU/mL — ABNORMAL LOW (ref 0.30–0.70)
Heparin Unfractionated: 0.29 IU/mL — ABNORMAL LOW (ref 0.30–0.70)

## 2020-09-12 LAB — PROCALCITONIN: Procalcitonin: 0.18 ng/mL

## 2020-09-12 LAB — CK: Total CK: 493 U/L — ABNORMAL HIGH (ref 49–397)

## 2020-09-12 MED ORDER — HEPARIN (PORCINE) 25000 UT/250ML-% IV SOLN
1500.0000 [IU]/h | INTRAVENOUS | Status: DC
  Administered 2020-09-12: 1500 [IU]/h via INTRAVENOUS
  Filled 2020-09-12: qty 250

## 2020-09-12 MED ORDER — SODIUM CHLORIDE 0.9 % IV SOLN
1.0000 g | INTRAVENOUS | Status: DC
  Administered 2020-09-12 – 2020-09-15 (×3): 1 g via INTRAVENOUS
  Filled 2020-09-12 (×2): qty 1
  Filled 2020-09-12: qty 10

## 2020-09-12 MED ORDER — DIPHENHYDRAMINE HCL 50 MG/ML IJ SOLN
25.0000 mg | Freq: Once | INTRAMUSCULAR | Status: AC
  Administered 2020-09-12: 25 mg via INTRAVENOUS
  Filled 2020-09-12: qty 1

## 2020-09-12 MED ORDER — CHLORHEXIDINE GLUCONATE CLOTH 2 % EX PADS: 6.0000 | MEDICATED_PAD | Freq: Every day | CUTANEOUS | Status: DC

## 2020-09-12 MED ORDER — HYDROXYZINE HCL 50 MG/ML IM SOLN
25.0000 mg | Freq: Four times a day (QID) | INTRAMUSCULAR | Status: DC | PRN
  Administered 2020-09-12 (×2): 25 mg via INTRAMUSCULAR
  Filled 2020-09-12 (×5): qty 0.5

## 2020-09-12 MED ORDER — HEPARIN (PORCINE) 25000 UT/250ML-% IV SOLN
1650.0000 [IU]/h | INTRAVENOUS | Status: AC
  Administered 2020-09-13: 1650 [IU]/h via INTRAVENOUS
  Filled 2020-09-12: qty 250

## 2020-09-12 MED ORDER — THIAMINE HCL 100 MG PO TABS
100.0000 mg | ORAL_TABLET | Freq: Every day | ORAL | Status: DC
  Administered 2020-09-12 – 2020-09-25 (×12): 100 mg via ORAL
  Filled 2020-09-12 (×12): qty 1

## 2020-09-12 MED ORDER — PHENAZOPYRIDINE HCL 200 MG PO TABS
200.0000 mg | ORAL_TABLET | Freq: Three times a day (TID) | ORAL | Status: AC
  Administered 2020-09-12 – 2020-09-15 (×9): 200 mg via ORAL
  Filled 2020-09-12 (×9): qty 1

## 2020-09-12 MED ORDER — DIPHENHYDRAMINE-ZINC ACETATE 2-0.1 % EX CREA
TOPICAL_CREAM | Freq: Two times a day (BID) | CUTANEOUS | Status: DC | PRN
  Administered 2020-09-12 – 2020-09-15 (×3): 1 via TOPICAL
  Filled 2020-09-12: qty 28

## 2020-09-12 MED ORDER — HEPARIN (PORCINE) 25000 UT/250ML-% IV SOLN
1300.0000 [IU]/h | INTRAVENOUS | Status: DC
  Administered 2020-09-12: 1300 [IU]/h via INTRAVENOUS

## 2020-09-12 NOTE — Progress Notes (Signed)
  Speech Language Pathology Treatment: Dysphagia  Patient Details Name: Jason Hartman MRN: [id number] DOB: April 30, 1935 Today's Date: 09/12/2020 Time: 1015-1029 SLP Time Calculation (min) (ACUTE ONLY): 14 min  Assessment / Plan / Recommendation Clinical Impression  Pt strategy utlity assessed during meal, pt consumed nearly all items on tray including eggs, pancakes, muffin, fruit, coffee and orange juice.  His mastication ability was intact with laryngeal elevation/swallow appreciated via observation. Cough x1 post-swallow of orange juice *occuring of approximately 15 total boluses, 7 liquid boluses*.  Venipuncture staff drawing blood during the single coughing episode - which is likely due to swallow/respiration discoordination.  Pt is afebrile and today he is on Room Air.  CXR showed bilateral atelectasis.  Educated pt to swallow precautions given his Parkinson's including alternative ways to take medications.  Pt denies dysphagia - does admit to rare occurence of coughing with intake.   No acute SLP follow up indicated as pt appears to be tolerating po diet well without clinical indications of aspiration.    HPI HPI: Patient is an 85 y.o. male with PMH: OSA, Parkinson's disease, RLS, anxiety, chronic pain, CKD, DM-2 insulin dependent, who presented to ED for evaluation of anxiety and diaphoresis. Per family report, patient has been increasingly anxious over past couple weeks. Patient reported in ED that he had recently been told to stop taking Xanax and was started on something else but couldnt remember name. Family reported patient has not been taking his medications recently. CT head negative for intracranial abnormalities and CXR revealed decreased lung volumes with bibasilar atelectasis.  CXR revealed old bilateral basal ganglia cvas- which pt denies.      SLP Plan  All goals met       Recommendations  Diet recommendations: Regular;Thin liquid Liquids provided via: Cup;Straw Medication  Administration: Whole meds with liquid Supervision: Patient able to self feed Compensations: Minimize environmental distractions;Slow rate;Small sips/bites Postural Changes and/or Swallow Maneuvers: Seated upright 90 degrees;Upright 30-60 min after meal                Oral Care Recommendations: Oral care BID Follow up Recommendations: None SLP Visit Diagnosis: Dysphagia, unspecified (R13.10) Plan: All goals met       GO                Jason Hartman 09/12/2020, 10:39 AM   Kathleen Lime, MS [facility] SLP Acute Rehab Services Office [phone] Pager [phone]

## 2020-09-12 NOTE — Progress Notes (Signed)
ANTICOAGULATION CONSULT NOTE - Follow Up Consult  Pharmacy Consult for heparin infusion Indication: atrial fibrillation  Allergies  Allergen Reactions  . Oxycodone Anaphylaxis    Other reaction(s): Other (See Comments) Cannot recall specifics  . Iodinated Diagnostic Agents Hives    alleric to renografin,isovue & omnipaque, hives, requires 13 hr prep//a.calhoun, Onset Date: [id number]     . Iodine Hives and Rash    alleric to renografin,isovue & omnipaque, hives, requires 13 hr prep//a.calhoun, Onset Date: [id number] allergic to renografin,isovue & omnipaque, hives, requires 13 hr prep//a.calhoun, Onset Date: [id number]  . Linezolid Hives and Rash       . Methadone Hcl Other (See Comments)    HALLUCINATIONS   . Promethazine Other (See Comments)    Mental status change, DELIRIUM   Other reaction(s): erratic behavior Other reaction(s): Other (See Comments) Mental status change DELIRIUM (pulled out IV)  . Morphine Sulfate Other (See Comments)    Pt not sure about allergy   . Other Hives    Other reaction(s): erratic behanvior Other reaction(s): erratic behavior  . Oxycodone Hcl Other (See Comments)    Pt not sure about allergy   . Penicillins Other (See Comments)    Pt not sure about allergy   . Quetiapine Other (See Comments)    Pt not sure about allergy  Other reaction(s): Other (See Comments) Pt not sure about allergy  Pt not sure about allergy   . Statins     Other reaction(s): muscle weakness Other reaction(s): muscle weakness  . Zolpidem Other (See Comments)    Other reaction(s): erratic behanvior Other reaction(s): Delusions (intolerance) Altered mental status  . Atorvastatin Itching and Other (See Comments)    Tired, weakness   . Carbidopa-Levodopa Anxiety  . Clindamycin Rash  . Colesevelam Other (See Comments)    tired   . Doxazosin Rash  . Lovastatin Other (See Comments)    Tired, nervousness   . Rosuvastatin Other (See Comments)    Tired, weakness      Patient Measurements: Height: 5\' 4"  (162.6 cm) Weight: 83.8 kg (184 lb 11.9 oz) IBW/kg (Calculated) : 59.2 Heparin Dosing Weight: 76 kg  Vital Signs: Temp: 97.5 F (36.4 C) (06/07 0000) Temp Source: Oral (06/07 0000) BP: 102/47 (06/07 0000) Pulse Rate: 77 (06/07 0000)  Labs: Recent Labs    09/09/20 0400 09/09/20 0635 09/10/20 0926 09/10/20 1108 09/10/20 1242 09/10/20 1629 09/11/20 0318 09/11/20 0325 09/11/20 0910 09/12/20 0038  HGB  --   --  17.6*  --   --   --   --  17.4*  --   --   HCT  --   --  54.7*  --   --   --   --  54.4*  --   --   PLT  --   --  210  --   --   --   --  167  --   --   APTT  --   --   --  28  --   --   --   --   --   --   LABPROT  --   --   --  15.0  --   --   --   --   --   --   INR  --   --   --  1.2  --   --   --   --   --   --   HEPARINUNFRC  --   --   --   --   --   --  0.49  --   --  0.16*  CREATININE  --    < > 2.20*  --   --  1.98*  --  1.62*  --  1.53*  CKTOTAL  --    < >  --  2,578*  --   --   --   --  1,227* 493*  TROPONINIHS 32*  --   --  388* 300*  --   --   --   --   --    < > = values in this interval not displayed.    Estimated Creatinine Clearance: 36.3 mL/min (A) (by C-G formula based on SCr of 1.53 mg/dL (H)).   Medications:  No anticoagulation prior to admission  Assessment: Pharmacy to dose heparin infusion for atrial fibrillation.  Hemoglobin slightly elevated and platelets WNL.  Baseline INR and aPTT WNL.  Was on heparin subq for dvt prophylaxis, last dose 6/5 1246.    09/12/2020:  Heparin off 1030-->1550 on 6/6. Heparin level 0.16- now subtherapeutic after resuming IV heparin infusion at 1100 units/hr  No bleeding or infusion related issues reported by RN  CBC- Hg elevated but stable, pltc WNL  Goal of Therapy:  Heparin level 0.3-0.7 units/ml Monitor platelets by anticoagulation protocol: Yes   Plan:   Increase IV heparin infusion to 1300 units/hr Check anti-Xa level in 8 hrs Daily heparin level & CBC  while on heparin Monitor for any s/s bleeding F/U clinical course for appropriateness to switch to [place], PharmD, BCPS 09/12/2020 1:32 AM

## 2020-09-12 NOTE — Progress Notes (Addendum)
Patient c/o itching on his upper bath even after a bath and itching and putting cream on his back. There are small red spots on this area on both sides of his back. Paged Jeannette Corpus. New order for benadryl IV and benadryl cream. Administered both and will continue to monitor.   Patient still c/o itching. Notified X. Blount. New order for Hydraxazine. Given and will continue to monitor.

## 2020-09-12 NOTE — Progress Notes (Signed)
ANTICOAGULATION CONSULT NOTE - Follow Up Consult  Pharmacy Consult for heparin infusion Indication: atrial fibrillation  Allergies  Allergen Reactions  . Oxycodone Anaphylaxis    Other reaction(s): Other (See Comments) Cannot recall specifics  . Iodinated Diagnostic Agents Hives    alleric to renografin,isovue & omnipaque, hives, requires 13 hr prep//a.calhoun, Onset Date: [id number]     . Iodine Hives and Rash    alleric to renografin,isovue & omnipaque, hives, requires 13 hr prep//a.calhoun, Onset Date: [id number] allergic to renografin,isovue & omnipaque, hives, requires 13 hr prep//a.calhoun, Onset Date: [id number]  . Linezolid Hives and Rash       . Methadone Hcl Other (See Comments)    HALLUCINATIONS   . Promethazine Other (See Comments)    Mental status change, DELIRIUM   Other reaction(s): erratic behavior Other reaction(s): Other (See Comments) Mental status change DELIRIUM (pulled out IV)  . Morphine Sulfate Other (See Comments)    Pt not sure about allergy   . Other Hives    Other reaction(s): erratic behanvior Other reaction(s): erratic behavior  . Oxycodone Hcl Other (See Comments)    Pt not sure about allergy   . Penicillins Other (See Comments)    Pt not sure about allergy   . Quetiapine Other (See Comments)    Pt not sure about allergy  Other reaction(s): Other (See Comments) Pt not sure about allergy  Pt not sure about allergy   . Statins     Other reaction(s): muscle weakness Other reaction(s): muscle weakness  . Zolpidem Other (See Comments)    Other reaction(s): erratic behanvior Other reaction(s): Delusions (intolerance) Altered mental status  . Atorvastatin Itching and Other (See Comments)    Tired, weakness   . Carbidopa-Levodopa Anxiety  . Clindamycin Rash  . Colesevelam Other (See Comments)    tired   . Doxazosin Rash  . Lovastatin Other (See Comments)    Tired, nervousness   . Rosuvastatin Other (See Comments)    Tired, weakness      Patient Measurements: Height: 5\' 4"  (162.6 cm) Weight: 85.3 kg (188 lb 0.8 oz) IBW/kg (Calculated) : 59.2 Heparin Dosing Weight: 76 kg  Vital Signs: Temp: 97.7 F (36.5 C) (06/07 0830) Temp Source: Oral (06/07 0830) BP: 129/58 (06/07 1200) Pulse Rate: 84 (06/07 1200)  Labs: Recent Labs    09/10/20 0926 09/10/20 1108 09/10/20 1242 09/10/20 1629 09/11/20 0318 09/11/20 0325 09/11/20 0910 09/12/20 0038 09/12/20 1150  HGB 17.6*  --   --   --   --  17.4*  --  15.1  --   HCT 54.7*  --   --   --   --  54.4*  --  48.9  --   PLT 210  --   --   --   --  167  --  152  --   APTT  --  28  --   --   --   --   --   --   --   LABPROT  --  15.0  --   --   --   --   --   --   --   INR  --  1.2  --   --   --   --   --   --   --   HEPARINUNFRC  --   --   --   --  0.49  --   --  0.16* 0.21*  CREATININE 2.20*  --   --  1.98*  --  1.62*  --  1.53*  --   CKTOTAL  --  2,578*  --   --   --   --  1,227* 493*  --   TROPONINIHS  --  388* 300*  --   --   --   --   --   --     Estimated Creatinine Clearance: 36.6 mL/min (A) (by C-G formula based on SCr of 1.53 mg/dL (H)).   Medications:  No anticoagulation prior to admission  Assessment: Pharmacy to dose heparin infusion for atrial fibrillation.  Hemoglobin slightly elevated and platelets WNL.  Baseline INR and aPTT WNL.  Was on heparin subq for dvt prophylaxis, last dose 6/5 1246.    09/12/2020:  Heparin level remains low @ 0.21 after rate increased to 1300 units/hr  No bleeding or infusion related issues reported by RN  CBC- WNL Order to start apixaban for Afib per card Pt may get LP today. D/w Dr Johnsie Cancel- continue bridge therapy with heparin drip until LP.  OK to wait until tomorrow/1 day after LP to start apixaban  Goal of Therapy:  Heparin level 0.3-0.7 units/ml Monitor platelets by anticoagulation protocol: Yes   Plan:  No information in chart notes @ possible LP today  Increase IV heparin infusion to 1500 units/hr Check  anti-Xa level in 8 hrs Daily heparin level & CBC while on heparin Monitor for any s/s bleeding Has orders to switch to apixaban per cards to start 1 day after LP  Eudelia Bunch, Pharm.D 09/12/2020 12:25 PM

## 2020-09-12 NOTE — Progress Notes (Addendum)
Progress Note    Jason Hartman  SMO:[id number] DOB: [birth year]  DOA: 09/08/2020 PCP: [name], MD      Brief Narrative:    Medical records reviewed and are as summarized below:  Jason Hartman is a 85 y.o. male with medical history significant forOSA, Parkinson's, restless leg syndrome, anxiety, chronic pain, chronic kidney disease, and insulin-dependent diabetes mellitus, who presented to the hospital with anxiety and diaphoresis.  Reportedly, patient has been feeling nauseous for about 2 weeks prior to admission and had complained that he felt like he was going to die.  Reportedly, he had recently been told to stop taking Xanax.  He was admitted to the hospital for acute metabolic encephalopathy/delirium of unclear etiology.  He had recently been started on tramadol, Celexa and buspirone and there was concern for serotonin syndrome but this was eventually ruled out.  Opioid withdrawal was also in the differential diagnosis.  He was transferred to stepdown unit for sedation (IV Precedex drip) because of worsening agitation.  Cardiologist was consulted for atrial flutter with RVR.  He was started on IV heparin infusion, metoprolol and amiodarone.    Assessment/Plan:   Principal Problem:   Anxiety Active Problems:   Type II diabetes mellitus (HCC)   OBSTRUCTIVE SLEEP APNEA   Parkinsonism (HCC)   Chronic kidney disease, stage 3a (HCC)   Malignant HTN with heart disease, w/o CHF, w/o chronic kidney disease   Elevated troponin   SIRS (systemic inflammatory response syndrome) (HCC)   Acute metabolic encephalopathy    Body mass index is 32.28 kg/m.  (Obesity)   Acute metabolic encephalopathy/delirium with agitation: He is off of IV Precedex drip.  Continue supportive care.  Lumbar puncture is pending. CT head and EEG were unremarkable.  Vitamin B12, RPR and ammonia were within normal limits.  SIRS: No clear evidence of infection at this time.  Hypotension: Required  Levophed briefly.  BP has improved.  Atrial flutter with RVR: Continue IV heparin, amiodarone and metoprolol.  Monitor heparin level per protocol.  Plan to transition to Eliquis tomorrow.  Follow-up with cardiologist.  Elevated troponin: Likely from demand ischemia/tachycardia.  2D echo showed EF estimated at 55 to 60%.  Elevated D-dimer: Venous duplex of lower extremity was negative and VQ scan was low probability for PE.  AKI on CKD stage IIIa: Improving.  Continue IV fluids and monitor BMP.  Other comorbidities include type II DM, anxiety, panic attacks, parkinsonism, hypertension    Diet Order            Diet heart healthy/carb modified Room service appropriate? Yes; Fluid consistency: Thin  Diet effective now                    Consultants:  Neurologist  Cardiologist  Intensivist  Procedures:  Plan for lumbar puncture today    Medications:   . amiodarone  200 mg Oral Daily  . Chlorhexidine Gluconate Cloth  6 each Topical Q0600  . diazepam  5 mg Intravenous Q4H  . insulin aspart  0-9 Units Subcutaneous Q4H  . mouth rinse  15 mL Mouth Rinse BID  . metoprolol succinate  50 mg Oral Daily  . mupirocin ointment  1 application Nasal BID  . Rotigotine  1 patch Transdermal QPM  . thiamine  100 mg Oral Daily   Continuous Infusions: . sodium chloride    . sodium chloride    . dextrose 5% lactated ringers with KCl 20 mEq/L 75 mL/hr at  09/12/20 1501  . heparin 1,500 Units/hr (09/12/20 1230)     Anti-infectives (From admission, onward)   Start     Dose/Rate Route Frequency Ordered Stop   09/09/20 0115  aztreonam (AZACTAM) 1 g in sodium chloride 0.9 % 100 mL IVPB        1 g 200 mL/hr over 30 Minutes Intravenous  Once 09/09/20 0106 09/09/20 0307             Family Communication/Anticipated D/C date and plan/Code Status   DVT prophylaxis:      Code Status: Full Code  Family Communication: None Disposition Plan: =   Status is:  Inpatient  Remains inpatient appropriate because:Inpatient level of care appropriate due to severity of illness   Dispo: The patient is from: Home              Anticipated d/c is to: SNF              Patient currently is not medically stable to d/c.   Difficult to place patient No           Subjective:   Interval events noted.  He is asking for cream crackers.  Objective:    Vitals:   09/12/20 1200 09/12/20 1500 09/12/20 1515 09/12/20 1549  BP: (!) 129/58   (!) 114/52  Pulse: 84 84  84  Resp: (!) 23 (!) 29  (!) 29  Temp:   97.8 F (36.6 C)   TempSrc:   Oral   SpO2: 95% 96%  94%  Weight:      Height:       No data found.   Intake/Output Summary (Last 24 hours) at 09/12/2020 1554 Last data filed at 09/12/2020 1501 Gross per 24 hour  Intake 1578.45 ml  Output 1201 ml  Net 377.45 ml   Filed Weights   09/08/20 2205 09/11/20 0418 09/12/20 0500  Weight: 80.7 kg 83.8 kg 85.3 kg    Exam:  GEN: NAD SKIN: Maculopapular rash on the back EYES: EOMI ENT: MMM CV: RRR PULM: CTA B ABD: soft, ND, NT, +BS CNS: AAO x 2 (person and place), non focal EXT: No edema or tenderness        Data Reviewed:   I have personally reviewed following labs and imaging studies:  Labs: Labs show the following:   Basic Metabolic Panel: Recent Labs  Lab 09/08/20 2339 09/10/20 0926 09/10/20 1629 09/11/20 0325 09/12/20 0038  NA 134* 143 141 140 132*  K 4.3 4.2 3.6 3.5 3.9  CL 98 108 108 106 102  CO2  --  24 23 25 23   GLUCOSE 308* 74 72 103* 164*  BUN 39* 41* 48* 49* 42*  CREATININE 1.30* 2.20* 1.98* 1.62* 1.53*  CALCIUM  --  9.3 8.9 8.8* 8.2*  MG  --   --   --  2.1 1.9  PHOS  --   --   --  3.9 3.0   GFR Estimated Creatinine Clearance: 36.6 mL/min (A) (by C-G formula based on SCr of 1.53 mg/dL (H)). Liver Function Tests: Recent Labs  Lab 09/08/20 2308 09/10/20 0926 09/11/20 0325 09/12/20 0038  AST 27 64* 109* 49*  ALT 32 37 48* 38  ALKPHOS 65 46 50 38   BILITOT 2.7* 1.8* 1.6* 2.0*  PROT 7.6 5.8* 6.2* 5.2*  ALBUMIN 4.3 3.4* 3.7 2.9*   No results for input(s): LIPASE, AMYLASE in the last 168 hours. Recent Labs  Lab 09/10/20 1108  AMMONIA 23   Coagulation  profile Recent Labs  Lab 09/10/20 1108  INR 1.2    CBC: Recent Labs  Lab 09/08/20 2308 09/08/20 2339 09/10/20 0926 09/11/20 0325 09/12/20 0038  WBC 15.7*  --  20.0* 16.8* 13.6*  NEUTROABS 14.0*  --   --  13.9* 11.5*  HGB 19.0* 20.1* 17.6* 17.4* 15.1  HCT 58.5* 59.0* 54.7* 54.4* 48.9  MCV 87.2  --  88.8 89.3 92.6  PLT 200  --  210 167 152   Cardiac Enzymes: Recent Labs  Lab 09/09/20 0635 09/10/20 1108 09/11/20 0910 09/12/20 0038  CKTOTAL 263 2,578* 1,227* 493*   BNP (last 3 results) No results for input(s): PROBNP in the last 8760 hours. CBG: Recent Labs  Lab 09/11/20 2337 09/12/20 0352 09/12/20 0757 09/12/20 1222 09/12/20 1517  GLUCAP 154* 143* 158* 222* 227*   D-Dimer: No results for input(s): DDIMER in the last 72 hours. Hgb A1c: No results for input(s): HGBA1C in the last 72 hours. Lipid Profile: No results for input(s): CHOL, HDL, LDLCALC, TRIG, CHOLHDL, LDLDIRECT in the last 72 hours. Thyroid function studies: No results for input(s): TSH, T4TOTAL, T3FREE, THYROIDAB in the last 72 hours.  Invalid input(s): FREET3 Anemia work up: Recent Labs    09/10/20 1108  VITAMINB12 718  FOLATE 20.7   Sepsis Labs: Recent Labs  Lab 09/08/20 2308 09/10/20 0926 09/10/20 1108 09/11/20 0325 09/12/20 0038  PROCALCITON  --   --  0.30 0.26 0.18  WBC 15.7* 20.0*  --  16.8* 13.6*    Microbiology Recent Results (from the past 240 hour(s))  Urine culture     Status: Abnormal   Collection Time: 09/08/20 11:35 PM   Specimen: Urine, Clean Catch  Result Value Ref Range Status   Specimen Description   Final    URINE, CLEAN CATCH Performed at [facility], [place] [address], [place], [place] [address]    Special Requests   Final     NONE Performed at [facility], [place] [address], [place], [place] [address]    Culture (A)  Final    <10,000 COLONIES/mL INSIGNIFICANT GROWTH Performed at [place] [address] [place],  [address]    Report Status 09/10/2020 FINAL  Final  Resp Panel by RT-PCR (Flu A&B, Covid) Nasopharyngeal Swab     Status: None   Collection Time: 09/08/20 11:41 PM   Specimen: Nasopharyngeal Swab; Nasopharyngeal(NP) swabs in vial transport medium  Result Value Ref Range Status   SARS Coronavirus 2 by RT PCR NEGATIVE NEGATIVE Final    Comment: (NOTE) SARS-CoV-2 target nucleic acids are NOT DETECTED.  The SARS-CoV-2 RNA is generally detectable in upper respiratory specimens during the acute phase of infection. The lowest concentration of SARS-CoV-2 viral copies this assay can detect is 138 copies/mL. A negative result does not preclude SARS-Cov-2 infection and should not be used as the sole basis for treatment or other patient management decisions. A negative result may occur with  improper specimen collection/handling, submission of specimen other than nasopharyngeal swab, presence of viral mutation(s) within the areas targeted by this assay, and inadequate number of viral copies(<138 copies/mL). A negative result must be combined with clinical observations, patient history, and epidemiological information. The expected result is Negative.  Fact Sheet for Patients:  EntrepreneurPulse.com.au  Fact Sheet for Healthcare Providers:  IncredibleEmployment.be  This test is no t yet approved or cleared by the [place] FDA and  has been authorized for detection and/or diagnosis of SARS-CoV-2 by FDA under an  Emergency Use Authorization (EUA). This EUA will remain  in effect (meaning this test can be used) for the duration of the COVID-19 declaration under Section 564(b)(1) of the Act, 21 U.S.C.section 360bbb-3(b)(1), unless  the authorization is terminated  or revoked sooner.       Influenza A by PCR NEGATIVE NEGATIVE Final   Influenza B by PCR NEGATIVE NEGATIVE Final    Comment: (NOTE) The Xpert Xpress SARS-CoV-2/FLU/RSV plus assay is intended as an aid in the diagnosis of influenza from Nasopharyngeal swab specimens and should not be used as a sole basis for treatment. Nasal washings and aspirates are unacceptable for Xpert Xpress SARS-CoV-2/FLU/RSV testing.  Fact Sheet for Patients: EntrepreneurPulse.com.au  Fact Sheet for Healthcare Providers: IncredibleEmployment.be  This test is not yet approved or cleared by the [place] FDA and has been authorized for detection and/or diagnosis of SARS-CoV-2 by FDA under an Emergency Use Authorization (EUA). This EUA will remain in effect (meaning this test can be used) for the duration of the COVID-19 declaration under Section 564(b)(1) of the Act, 21 U.S.C. section 360bbb-3(b)(1), unless the authorization is terminated or revoked.  Performed at [facility], [place] [address] [place], [place] [address]   Blood culture (routine x 2)     Status: Abnormal (Preliminary result)   Collection Time: 09/09/20  1:03 AM   Specimen: BLOOD  Result Value Ref Range Status   Specimen Description   Final    BLOOD RIGHT WRIST Performed at [place] [address] [place], [place] [address]    Special Requests   Final    BOTTLES DRAWN AEROBIC AND ANAEROBIC Blood Culture adequate volume Performed at [place] [address] [place], [place] [address]    Culture  Setup Time   Final    GRAM POSITIVE COCCI IN CLUSTERS IN BOTH AEROBIC AND ANAEROBIC BOTTLES CRITICAL RESULT CALLED TO, READ BACK BY AND VERIFIED WITH: PHARMD MICHELLE LILLISTAN 09/09/2020 AT 2220 A.HUGHES    Culture (A)  Final    STAPHYLOCOCCUS HOMINIS CULTURE REINCUBATED FOR BETTER GROWTH Performed  at [facility], [place] [address] [place], [place] [address]    Report Status PENDING  Incomplete  Blood Culture ID Panel (Reflexed)     Status: Abnormal   Collection Time: 09/09/20  1:03 AM  Result Value Ref Range Status   Enterococcus faecalis NOT DETECTED NOT DETECTED Final   Enterococcus Faecium NOT DETECTED NOT DETECTED Final   Listeria monocytogenes NOT DETECTED NOT DETECTED Final   Staphylococcus species DETECTED (A) NOT DETECTED Final    Comment: CRITICAL RESULT CALLED TO, READ BACK BY AND VERIFIED WITH: PHARMD MICHELLE LILLISTAN 09/09/2020 AT 2220 A.HUGHES    Staphylococcus aureus (BCID) NOT DETECTED NOT DETECTED Final   Staphylococcus epidermidis DETECTED (A) NOT DETECTED Final    Comment: Methicillin (oxacillin) resistant coagulase negative staphylococcus. Possible blood culture contaminant (unless isolated from more than one blood culture draw or clinical case suggests pathogenicity). No antibiotic treatment is indicated for blood  culture contaminants. CRITICAL RESULT CALLED TO, READ BACK BY AND VERIFIED WITH: PHARMD MICHELLE LILLISTAN 09/09/2020 AT 2220 A.HUGHES    Staphylococcus lugdunensis NOT DETECTED NOT DETECTED Final   Streptococcus species NOT DETECTED NOT DETECTED Final   Streptococcus agalactiae NOT DETECTED NOT DETECTED Final   Streptococcus pneumoniae NOT DETECTED NOT DETECTED Final   Streptococcus pyogenes NOT DETECTED NOT DETECTED Final   A.calcoaceticus-baumannii NOT DETECTED NOT DETECTED Final   Bacteroides fragilis NOT DETECTED NOT DETECTED Final  Enterobacterales NOT DETECTED NOT DETECTED Final   Enterobacter cloacae complex NOT DETECTED NOT DETECTED Final   Escherichia coli NOT DETECTED NOT DETECTED Final   Klebsiella aerogenes NOT DETECTED NOT DETECTED Final   Klebsiella oxytoca NOT DETECTED NOT DETECTED Final   Klebsiella pneumoniae NOT DETECTED NOT DETECTED Final   Proteus species NOT DETECTED NOT DETECTED Final   Salmonella species NOT  DETECTED NOT DETECTED Final   Serratia marcescens NOT DETECTED NOT DETECTED Final   Haemophilus influenzae NOT DETECTED NOT DETECTED Final   Neisseria meningitidis NOT DETECTED NOT DETECTED Final   Pseudomonas aeruginosa NOT DETECTED NOT DETECTED Final   Stenotrophomonas maltophilia NOT DETECTED NOT DETECTED Final   Candida albicans NOT DETECTED NOT DETECTED Final   Candida auris NOT DETECTED NOT DETECTED Final   Candida glabrata NOT DETECTED NOT DETECTED Final   Candida krusei NOT DETECTED NOT DETECTED Final   Candida parapsilosis NOT DETECTED NOT DETECTED Final   Candida tropicalis NOT DETECTED NOT DETECTED Final   Cryptococcus neoformans/gattii NOT DETECTED NOT DETECTED Final   Methicillin resistance mecA/C DETECTED (A) NOT DETECTED Final    Comment: CRITICAL RESULT CALLED TO, READ BACK BY AND VERIFIED WITH: PHARMD MICHELLE LILLISTAN 09/09/2020 AT 2220 A.HUGHES Performed at [facility], [place] [address], [place], [place] [address]   Blood culture (routine x 2)     Status: None (Preliminary result)   Collection Time: 09/09/20  1:08 AM   Specimen: BLOOD  Result Value Ref Range Status   Specimen Description   Final    BLOOD LEFT WRIST Performed at [place] [address], [place], [place] [address]    Special Requests   Final    BOTTLES DRAWN AEROBIC AND ANAEROBIC Blood Culture adequate volume Performed at [place] [address], [place], [place] [address]    Culture   Final    NO GROWTH 3 DAYS Performed at [facility], [place] [address], [place], [place] [address]    Report Status PENDING  Incomplete  MRSA PCR Screening     Status: Abnormal   Collection Time: 09/10/20  5:19 AM   Specimen: Nasal Mucosa; Nasopharyngeal  Result Value Ref Range Status   MRSA by PCR POSITIVE (A) NEGATIVE Final    Comment:        The GeneXpert MRSA Assay (FDA approved for NASAL specimens only), is one component of a comprehensive MRSA  colonization surveillance program. It is not intended to diagnose MRSA infection nor to guide or monitor treatment for MRSA infections. RESULT CALLED TO, READ BACK BY AND VERIFIED WITH: HEAVNER,A. RN AT 1150 09/10/20 MULLINS,T Performed at [facility], [place] [address] [place], [place] [address]   Culture, blood (routine x 2)     Status: None (Preliminary result)   Collection Time: 09/10/20 11:03 AM   Specimen: BLOOD  Result Value Ref Range Status   Specimen Description   Final    BLOOD LEFT FOREARM Performed at [place] [address], [place], [place] [address]    Special Requests   Final    BOTTLES DRAWN AEROBIC ONLY Blood Culture results may not be optimal due to an inadequate volume of blood received in culture bottles Performed at [place] [address], [place],  [address]    Culture   Final    NO GROWTH 2 DAYS Performed at [place] [address] [place],  [address]  Report Status PENDING  Incomplete  Culture, blood (routine x 2)     Status: None (Preliminary result)   Collection Time: 09/10/20 11:03 AM   Specimen: BLOOD  Result Value Ref Range Status   Specimen Description   Final    BLOOD RIGHT ANTECUBITAL Performed at [place] [address], [place], [place] [address]    Special Requests   Final    BOTTLES DRAWN AEROBIC AND ANAEROBIC Blood Culture adequate volume Performed at [place] [address], [place], [place] [address]    Culture   Final    NO GROWTH 2 DAYS Performed at [place] [address], [place], [place] [address]    Report Status PENDING  Incomplete    Procedures and diagnostic studies:  DG Foot 2 Views Left  Result Date: 09/10/2020 CLINICAL DATA:  LEFT foot bruising. EXAM: LEFT FOOT - 2 VIEW COMPARISON:  None. FINDINGS: Osseous alignment is normal. No fracture line or displaced fracture  fragment is seen. No degenerative change. Adjacent soft tissues are unremarkable. IMPRESSION: Negative. Electronically Signed   By: Franki Cabot M.D.   On: 09/10/2020 16:44   DG Toe 2nd Left  Result Date: 09/11/2020 CLINICAL DATA:  Pain and bruising. EXAM: LEFT SECOND TOE COMPARISON:  Foot radiograph 652 FINDINGS: There is no evidence of fracture or dislocation. Mild degenerative change of the proximal interphalangeal joint with spurring. No erosion or bone destruction. Soft tissues are unremarkable. IMPRESSION: Mild degenerative change without acute findings of the second toe. Electronically Signed   By: Keith Rake M.D.   On: 09/11/2020 21:59   DG Toe 3rd Left  Result Date: 09/11/2020 CLINICAL DATA:  Pain and bruising. EXAM: LEFT THIRD TOE COMPARISON:  Foot radiograph 09/10/2020 FINDINGS: Lateral view limited by positioning and osseous overlap. There is no evidence of fracture or dislocation. There is no evidence of arthropathy or other focal bone abnormality. Soft tissues are unremarkable. IMPRESSION: Negative radiographs of the left third toe. Electronically Signed   By: Keith Rake M.D.   On: 09/11/2020 21:58   EEG adult  Result Date: 09/11/2020 Lora Havens, MD     09/11/2020  1:27 PM Patient Name: Jason Hartman MRN: [id number] Epilepsy Attending: Lora Havens Referring Physician/Provider: Dr Margaretha Seeds Date: 09/11/2020 Duration: 1 hour 8 mins Patient history: 85 year old male with altered mental status.  EEG to evaluate for seizures. Level of alertness: Awake AEDs during EEG study: [place] Technical aspects: This EEG study was done with ceribell EEG system. Electrical activity was reviewed with a high frequency filter of 70Hz  and a low frequency filter of 1Hz . EEG data were recorded continuously and digitally stored. Description: The posterior dominant rhythm consists of 9 Hz activity of moderate voltage (25-35 uV) seen predominantly in posterior head regions, symmetric and  reactive to eye opening and eye closing. EEG showed cintermittent generalized 3 to 6 Hz theta-delta slowing. Hyperventilation and photic stimulation were not performed.   ABNORMALITY - Intermittent slow, generalized IMPRESSION: This study is suggestive of mild diffuse encephalopathy, nonspecific etiology. No seizures or epileptiform discharges were seen throughout the recording. Jason Hartman               LOS: 2 days   Jason Hartman  [facility]   Pager on [email or web address]. If 7PM-7AM, please contact night-coverage at [email or web address]     09/12/2020, 3:54 PM

## 2020-09-12 NOTE — Progress Notes (Signed)
PHARMACIST - PHYSICIAN COMMUNICATION    CONCERNING: IV to Oral Route Change Policy  RECOMMENDATION: This patient is receiving thiamine by the intravenous route.  Based on criteria approved by the Pharmacy and Therapeutics Committee, the intravenous medication(s) is/are being converted to the equivalent oral dose form(s).   DESCRIPTION: These criteria include: The patient is eating (either orally or via tube) and/or has been taking other orally administered medications for a least 24 hours The patient has no evidence of active gastrointestinal bleeding or impaired GI absorption (gastrectomy, short bowel, patient on TNA or NPO).  If you have questions about this conversion, please contact the Pharmacy Department  []  ( [phone] )  [place] []  ( [phone] )  [facility] []  ( [phone] )  [place] []  ( [phone] )  [facility] [x]  ( [phone] )  [facility]  

## 2020-09-12 NOTE — Progress Notes (Signed)
Pharmacy: anticoag  Order to start apixaban for Afib. Pt may get LP today. D/w Dr Johnsie Cancel- continue bridge therapy with heparin drip until LP.  OK to wait until tomorrow/1 day after LP to start apixaban.  Plan: continue heparin at 1300 units/hr HL ordered for 10 am F/u timing of LP  Eudelia Bunch, Pharm.D 09/12/2020 8:26 AM

## 2020-09-12 NOTE — Progress Notes (Signed)
Primary cardiologist :  Gayla Doss   Subjective:  Much improved MS this am Complains of being cold   Objective:  Vitals:   09/12/20 0459 09/12/20 0500 09/12/20 0503 09/12/20 0600  BP: (!) 114/52  (!) 109/46 (!) 115/56  Pulse: 65  65 70  Resp: 17  14 17   Temp:      TempSrc:      SpO2: 100%  100% 100%  Weight:  85.3 kg    Height:        Intake/Output from previous day:  Intake/Output Summary (Last 24 hours) at 09/12/2020 0759 Last data filed at 09/12/2020 0504 Gross per 24 hour  Intake 1598.43 ml  Output 1050 ml  Net 548.43 ml    Physical Exam: Normal MS Lungs clear Normal cardiac sounds Abdomen soft  No edema PT palpable bilaterally   Lab Results: Basic Metabolic Panel: Recent Labs    09/11/20 0325 09/12/20 0038  NA 140 132*  K 3.5 3.9  CL 106 102  CO2 25 23  GLUCOSE 103* 164*  BUN 49* 42*  CREATININE 1.62* 1.53*  CALCIUM 8.8* 8.2*  MG 2.1 1.9  PHOS 3.9 3.0   Liver Function Tests: Recent Labs    09/11/20 0325 09/12/20 0038  AST 109* 49*  ALT 48* 38  ALKPHOS 50 38  BILITOT 1.6* 2.0*  PROT 6.2* 5.2*  ALBUMIN 3.7 2.9*   No results for input(s): LIPASE, AMYLASE in the last 72 hours. CBC: Recent Labs    09/11/20 0325 09/12/20 0038  WBC 16.8* 13.6*  NEUTROABS 13.9* 11.5*  HGB 17.4* 15.1  HCT 54.4* 48.9  MCV 89.3 92.6  PLT 167 152   Cardiac Enzymes: Recent Labs    09/10/20 1108 09/11/20 0910 09/12/20 0038  CKTOTAL 2,578* 1,227* 493*   BNP: Invalid input(s): POCBNP D-Dimer: Recent Labs    09/09/20 0830  DDIMER 1.87*   Hemoglobin A1C: Thyroid Function Tests: No results for input(s): TSH, T4TOTAL, T3FREE, THYROIDAB in the last 72 hours.  Invalid input(s): FREET3 Anemia Panel: Recent Labs    09/10/20 1108  VITAMINB12 718  FOLATE 20.7    Imaging: Imaging results have been reviewed  Cardiac Studies:  ECG: SR sinus arrhythmia 09/10/20   Telemetry:  SR with PVC rare   Echo: EF 55-60% mild LAE trivial MR    Medications:   . amiodarone  200 mg Oral Daily  . Chlorhexidine Gluconate Cloth  6 each Topical Q0600  . diazepam  5 mg Intravenous Q4H  . insulin aspart  0-9 Units Subcutaneous Q4H  . mouth rinse  15 mL Mouth Rinse BID  . metoprolol succinate  50 mg Oral Daily  . mupirocin ointment  1 application Nasal BID  . Rotigotine  1 patch Transdermal QPM  . thiamine  100 mg Oral Daily     . sodium chloride    . sodium chloride    . dextrose 5% lactated ringers with KCl 20 mEq/L 75 mL/hr at 09/12/20 0320  . heparin 1,300 Units/hr (09/12/20 0320)    Assessment/Plan:  Jason Hartman is a 85 y.o. male with a hx of OSA, Parkinson's disease, CKD, IDDM who is being seen 09/10/2020 for the evaluation of atrial flutter at the request of Dr. Florene Glen  1. PAF:  Echo with no structural heart disease Arrhythmia driven by neurologic issues, panic attacks anxiety and ? serotonin syndrome with polypharmacy Per Dr Gardiner Rhyme started on heparin transition to [place] when more stable CT head negative W/u for neuro per  primary service NSR this am Start low dose amiodarone and beta blocker PO intake may be an issue He should be able to take PO this am change heparin to eliquis 5 mg bid   Jenkins Rouge 09/12/2020, 7:59 AM

## 2020-09-12 NOTE — Progress Notes (Signed)
Brief Pharmacy Note:  39 y/oM on IV heparin for atrial fibrillation.    Confirmed with RN that patient did not have LP today and heparin infusion was therefore not paused/stopped for any reason   PM heparin level = 0.29 units/mL, slightly subtherapeutic  CBC: H/H, Pltc WNL  No bleeding or infusion issues noted per nursing   Plan:  Increase heparin infusion to 1650 units/hr  Heparin level 8 hours after rate change  Daily CBC, heparin level  F/u in AM regarding plans for LP vs. switching anticoagulation to [place], PharmD, BCPS Clinical Pharmacist  09/12/2020 9:12 PM

## 2020-09-12 NOTE — Progress Notes (Addendum)
Pt seen, no increased wob or respiratory distress noted.  HR85, RR19, spo2 97% on room air.  Bipap not indicated at this time.

## 2020-09-12 NOTE — Evaluation (Signed)
Physical Therapy Evaluation Patient Details Name: Jason Hartman MRN: [id number] DOB: [birth year] Today's Date: 09/12/2020   History of Present Illness  85 yo male admitted with anxiety, afib, ams, agitation requiring sedation. hx of osa, parkinson's ckd, dm  Clinical Impression  Limited bed level eval on today 2* lethargy. Total A +2 for attempt at bed mobility. Per RN, recently medicated. RN also stated it took +3 for them to assist pt on/off bsc. Pt unable to provide PLOF/history at time of eval. No family present. Will plan to follow and progress activity as able. Recommendation is for SNF at this time.     Follow Up Recommendations SNF    Equipment Recommendations  None recommended by PT    Recommendations for Other Services       Precautions / Restrictions Precautions Precautions: Fall Restrictions Weight Bearing Restrictions: No      Mobility  Bed Mobility Overal bed mobility: Needs Assistance Bed Mobility: Supine to Sit;Sit to Supine     Supine to sit: Total assist;+2 for physical assistance;+2 for safety/equipment;HOB elevated Sit to supine: Total assist;+2 for physical assistance;+2 for safety/equipment;HOB elevated   General bed mobility comments: Partially sat pt up EOB. Pt initiated ~5%. Too lethargic and unsafe to continue to returned to supine.    Transfers                    Ambulation/Gait                Stairs            Wheelchair Mobility    Modified Rankin (Stroke Patients Only)       Balance Overall balance assessment: Needs assistance;History of Falls                                           Pertinent Vitals/Pain Pain Assessment: No/denies pain    Home Living                        Prior Function           Comments: pt unable to provide history at time of eval 2* lethargy     Hand Dominance        Extremity/Trunk Assessment   Upper Extremity Assessment Upper Extremity  Assessment: Generalized weakness    Lower Extremity Assessment Lower Extremity Assessment: Generalized weakness       Communication      Cognition Arousal/Alertness: Lethargic Behavior During Therapy: Flat affect Overall Cognitive Status: Difficult to assess                                        General Comments      Exercises     Assessment/Plan    PT Assessment Patient needs continued PT services  PT Problem List Decreased strength;Decreased mobility;Decreased activity tolerance;Decreased balance;Decreased knowledge of use of DME       PT Treatment Interventions DME instruction;Gait training;Therapeutic exercise;Balance training;Functional mobility training;Therapeutic activities;Patient/family education    PT Goals (Current goals can be found in the Care Plan section)  Acute Rehab PT Goals Patient Stated Goal: none staated PT Goal Formulation: Patient unable to participate in goal setting Time For Goal Achievement: 09/26/20 Potential to Achieve Goals: Fair    Frequency  Min 2X/week   Barriers to discharge        Co-evaluation               AM-PAC PT "6 Clicks" Mobility  Outcome Measure Help needed turning from your back to your side while in a flat bed without using bedrails?: Total Help needed moving from lying on your back to sitting on the side of a flat bed without using bedrails?: Total Help needed moving to and from a bed to a chair (including a wheelchair)?: Total Help needed standing up from a chair using your arms (e.g., wheelchair or bedside chair)?: Total Help needed to walk in hospital room?: Total Help needed climbing 3-5 steps with a railing? : Total 6 Click Score: 6    End of Session   Activity Tolerance: Patient limited by lethargy Patient left: in bed;with call bell/phone within reach;with bed alarm set   PT Visit Diagnosis: Difficulty in walking, not elsewhere classified (R26.2)    Time: 3254-9826 PT Time  Calculation (min) (ACUTE ONLY): 8 min   Charges:   PT Evaluation $PT Eval Moderate Complexity: 1 Mod            Doreatha Massed, PT Acute Rehabilitation  Office: [phone] Pager: [phone]

## 2020-09-12 NOTE — Plan of Care (Signed)
Discussed in the beginning of the shift with patient plan of care for the evening, pain management and itching with no evidence of learning at this time.  We called and talked to his wife Inez Catalina so he could tell her goodnight.  Patient is very anxious and yelling out at this time.  He is complaining of it burning when he is urinating.  Even though he has had 900 ml out from his external primofit which looked amber, cloudy with sediment his bladder scan showed 435 ml and area is tender when touching   Problem: Education: Goal: Knowledge of General Education information will improve Description: Including pain rating scale, medication(s)/side effects and non-pharmacologic comfort measures Outcome: Not Progressing   Problem: Health Behavior/Discharge Planning: Goal: Ability to manage health-related needs will improve Outcome: Not Progressing   Problem: Activity: Goal: Risk for activity intolerance will decrease Outcome: Not Progressing   Problem: Coping: Goal: Level of anxiety will decrease Outcome: Not Progressing   Problem: Elimination: Goal: Will not experience complications related to urinary retention Outcome: Not Progressing   Problem: Pain Managment: Goal: General experience of comfort will improve Outcome: Not Progressing

## 2020-09-13 ENCOUNTER — Ambulatory Visit: Payer: Medicare Other | Admitting: Podiatry

## 2020-09-13 ENCOUNTER — Other Ambulatory Visit: Payer: Self-pay

## 2020-09-13 DIAGNOSIS — R4182 Altered mental status, unspecified: Secondary | ICD-10-CM

## 2020-09-13 LAB — GLUCOSE, CAPILLARY
Glucose-Capillary: 144 mg/dL — ABNORMAL HIGH (ref 70–99)
Glucose-Capillary: 157 mg/dL — ABNORMAL HIGH (ref 70–99)
Glucose-Capillary: 162 mg/dL — ABNORMAL HIGH (ref 70–99)
Glucose-Capillary: 181 mg/dL — ABNORMAL HIGH (ref 70–99)
Glucose-Capillary: 184 mg/dL — ABNORMAL HIGH (ref 70–99)
Glucose-Capillary: 204 mg/dL — ABNORMAL HIGH (ref 70–99)

## 2020-09-13 LAB — COMPREHENSIVE METABOLIC PANEL
ALT: 35 U/L (ref 0–44)
AST: 34 U/L (ref 15–41)
Albumin: 3 g/dL — ABNORMAL LOW (ref 3.5–5.0)
Alkaline Phosphatase: 46 U/L (ref 38–126)
Anion gap: 8 (ref 5–15)
BUN: 30 mg/dL — ABNORMAL HIGH (ref 8–23)
CO2: 22 mmol/L (ref 22–32)
Calcium: 8.7 mg/dL — ABNORMAL LOW (ref 8.9–10.3)
Chloride: 108 mmol/L (ref 98–111)
Creatinine, Ser: 1.27 mg/dL — ABNORMAL HIGH (ref 0.61–1.24)
GFR, Estimated: 56 mL/min — ABNORMAL LOW (ref >60)
Glucose, Bld: 177 mg/dL — ABNORMAL HIGH (ref 70–99)
Potassium: 4.4 mmol/L (ref 3.5–5.1)
Sodium: 138 mmol/L (ref 135–145)
Total Bilirubin: 0.9 mg/dL (ref 0.3–1.2)
Total Protein: 5.4 g/dL — ABNORMAL LOW (ref 6.5–8.1)

## 2020-09-13 LAB — CBC
HCT: 47 % (ref 39.0–52.0)
Hemoglobin: 14.6 g/dL (ref 13.0–17.0)
MCH: 28.4 pg (ref 26.0–34.0)
MCHC: 31.1 g/dL (ref 30.0–36.0)
MCV: 91.4 fL (ref 80.0–100.0)
Platelets: 111 10*3/uL — ABNORMAL LOW (ref 150–400)
RBC: 5.14 MIL/uL (ref 4.22–5.81)
RDW: 14.2 % (ref 11.5–15.5)
WBC: 9 10*3/uL (ref 4.0–10.5)
nRBC: 0 % (ref 0.0–0.2)

## 2020-09-13 LAB — CULTURE, BLOOD (ROUTINE X 2): Special Requests: ADEQUATE

## 2020-09-13 LAB — HEPARIN LEVEL (UNFRACTIONATED): Heparin Unfractionated: 0.36 IU/mL (ref 0.30–0.70)

## 2020-09-13 MED ORDER — APIXABAN 5 MG PO TABS
5.0000 mg | ORAL_TABLET | Freq: Two times a day (BID) | ORAL | Status: DC
  Administered 2020-09-13 – 2020-09-15 (×5): 5 mg via ORAL
  Filled 2020-09-13 (×5): qty 1

## 2020-09-13 MED ORDER — APIXABAN 2.5 MG PO TABS: 2.5000 mg | ORAL_TABLET | Freq: Two times a day (BID) | ORAL | Status: DC

## 2020-09-13 NOTE — Progress Notes (Signed)
[facility] PROGRESS NOTE   Jason Hartman JME:[id number] DOB: [birth year] DOA: 09/08/2020  PCP: [name], MD  Brief History/Interval Summary: 85 y.o.malewith medical history significant forOSA, Parkinson's, restless leg syndrome, anxiety, chronic pain, chronic kidney disease, and insulin-dependent diabetes mellitus, who presented to the emergency department for evaluation of anxiety and diaphoresis. Patient has had a lot of medication changes recently including discontinuation of alprazolam and initiation of tramadol Celexa BuSpar.  During the course of this hospital stay has become very encephalopathic.  Neurology was consulted.  He had to be moved to the stepdown unit.    Reason for Visit: Acute metabolic encephalopathy  Consultants: Neurology, pulmonology, cardiology  Procedures:   EEG did not show any epileptiform or seizure activity  Antibiotics: Anti-infectives (From admission, onward)   Start     Dose/Rate Route Frequency Ordered Stop   09/13/20 0000  cefTRIAXone (ROCEPHIN) 1 g in sodium chloride 0.9 % 100 mL IVPB        1 g 200 mL/hr over 30 Minutes Intravenous Every 24 hours 09/12/20 2339     09/09/20 0115  aztreonam (AZACTAM) 1 g in sodium chloride 0.9 % 100 mL IVPB        1 g 200 mL/hr over 30 Minutes Intravenous  Once 09/09/20 0106 09/09/20 0307      Subjective/Interval History: Patient noted to be in mittens.  He was able to answer most of my questions appropriately.  Denies any headache.  No neck pain.  No chest pain.  Denies shortness of breath nausea or vomiting.  Does complain of burning with urination.     Assessment/Plan:  Acute metabolic encephalopathy Etiology is unclear.  Differential diagnosis has been broad including polypharmacy, withdrawal, serotonin syndrome which has been less likely.   Infectious etiology was also considered although patient did not have any obvious meningeal signs.  LP was considered however it appears that his  wife did not want him to undergo this test.  Since meningitis seems to be less it is reasonable not to pursue the LP at this time.   CT head shows chronic changes without any acute findings. Patient does not have any focal neurological deficits. Neurology has been following.  EEG did not show any epileptiform activity. TSH 1.09 with free T4 of 1.3. Cortisol level 35.5.  I29 was normal.  Folic acid ammonia levels were normal. UA was noted to be abnormal yesterday.  Patient was complaining of dysuria and had suprapubic tenderness.  Started on ceftriaxone. Patient was requiring Precedex infusion which has been discontinued.  Mentation seems to be slightly better based on reports.  Continue to monitor for now.  Urinary tract infection UA noted to be abnormal.  Patient with dysuria and suprapubic tenderness.  Follow-up on cultures.  Continue ceftriaxone.  Hypotension Was transiently on Levophed but now blood pressure is noted to be in the hypertensive range.  Mild rhabdomyolysis CK level improved from 2578 to 493.    Atrial flutter, new diagnosis Echocardiogram showed normal EF.  Cardiology was following.  Patient was on heparin infusion which has been transitioned to apixaban.  Patient remains on amiodarone and metoprolol.  Staph hominis bacteremia Thought to be contaminant.  Obstructive sleep apnea CPAP nightly.  Elevated D-dimer Lower extremity Doppler studies negative.  VQ scan low probability.  History of parkinsonism's Noted to be on Rotigotine patch.  History of anxiety Recently stopped Xanax and benzodiazepine withdrawal was considered.  Apparently has been taking gabapentin which is currently on hold.  History  of restless leg syndrome Gabapentin is on hold.  Acute kidney injury on chronic kidney disease stage IIIa Electrolytes are stable.  Monitor urine output.  Insulin-dependent diabetes mellitus HbA1c was 6.5 in May.  Holding basal insulin.  Currently on D5 infusion.   Monitor CBGs.  Essential hypertension Remains on metoprolol.  Monitor blood pressures closely.  Obesity Estimated body mass index is 32.54 kg/m as calculated from the following:   Height as of this encounter: 5\' 4"  (1.626 m).   Weight as of this encounter: 86 kg.    DVT Prophylaxis: Currently on IV heparin will be transition to Eliquis Code Status: Full code Family Communication: No family at bedside Disposition Plan: May need to go to rehab.  PT and OT evaluation.  Status is: Inpatient  Remains inpatient appropriate because:Altered mental status and IV treatments appropriate due to intensity of illness or inability to take PO   Dispo: The patient is from: Home              Anticipated d/c is to: SNF              Patient currently is not medically stable to d/c.   Difficult to place patient No       Medications:  Scheduled: . amiodarone  200 mg Oral Daily  . diazepam  5 mg Intravenous Q4H  . insulin aspart  0-9 Units Subcutaneous Q4H  . mouth rinse  15 mL Mouth Rinse BID  . metoprolol succinate  50 mg Oral Daily  . mupirocin ointment  1 application Nasal BID  . phenazopyridine  200 mg Oral TID WC  . Rotigotine  1 patch Transdermal QPM  . thiamine  100 mg Oral Daily   Continuous: . sodium chloride    . sodium chloride    . cefTRIAXone (ROCEPHIN)  IV Stopped (09/13/20 0026)  . dextrose 5% lactated ringers with KCl 20 mEq/L 75 mL/hr at 09/13/20 0356  . heparin 1,650 Units/hr (09/13/20 0905)   PRN:[CANCELED] Place/Maintain arterial line **AND** sodium chloride, acetaminophen **OR** acetaminophen, diazepam, diphenhydrAMINE-zinc acetate, hydrALAZINE, HYDROmorphone (DILAUDID) injection, hydrOXYzine, naLOXone (NARCAN)  injection, ofloxacin, ondansetron **OR** ondansetron (ZOFRAN) IV, senna-docusate, technetium albumin aggregated   Objective:  Vital Signs  Vitals:   09/13/20 0500 09/13/20 0600 09/13/20 0720 09/13/20 0800  BP: (!) 144/59 (!) 151/84  (!) 150/78   Pulse: 85 81  80  Resp: 15 (!) 21  17  Temp:   97.6 F (36.4 C)   TempSrc:   Oral   SpO2: 97% 99%  97%  Weight:      Height:        Intake/Output Summary (Last 24 hours) at 09/13/2020 1020 Last data filed at 09/13/2020 0607 Gross per 24 hour  Intake 2845.38 ml  Output 2851 ml  Net -5.62 ml   Filed Weights   09/11/20 0418 09/12/20 0500 09/13/20 0412  Weight: 83.8 kg 85.3 kg 86 kg    General appearance: Somewhat distracted but awake.  Answers a few questions appropriately.  No distress. Neck is soft and supple. Resp: Clear to auscultation bilaterally.  Normal effort Cardio: S1-S2 is normal regular.  No S3-S4.  No rubs murmurs or bruit GI: Abdomen is soft.  Nontender nondistended.  Bowel sounds are present normal.  No masses organomegaly Extremities: No edema.  Moving all of his extremities Neurologic:   No focal neurological deficits.    Lab Results:  Data Reviewed: I have personally reviewed following labs and imaging studies  CBC: Recent Labs  Lab 09/08/20 2308 09/08/20 2339 09/10/20 0926 09/11/20 0325 09/12/20 0038 09/13/20 0511  WBC 15.7*  --  20.0* 16.8* 13.6* 9.0  NEUTROABS 14.0*  --   --  13.9* 11.5*  --   HGB 19.0* 20.1* 17.6* 17.4* 15.1 14.6  HCT 58.5* 59.0* 54.7* 54.4* 48.9 47.0  MCV 87.2  --  88.8 89.3 92.6 91.4  PLT 200  --  210 167 152 111*    Basic Metabolic Panel: Recent Labs  Lab 09/10/20 0926 09/10/20 1629 09/11/20 0325 09/12/20 0038 09/13/20 0511  NA 143 141 140 132* 138  K 4.2 3.6 3.5 3.9 4.4  CL 108 108 106 102 108  CO2 24 23 25 23 22   GLUCOSE 74 72 103* 164* 177*  BUN 41* 48* 49* 42* 30*  CREATININE 2.20* 1.98* 1.62* 1.53* 1.27*  CALCIUM 9.3 8.9 8.8* 8.2* 8.7*  MG  --   --  2.1 1.9  --   PHOS  --   --  3.9 3.0  --     GFR: Estimated Creatinine Clearance: 44.3 mL/min (A) (by C-G formula based on SCr of 1.27 mg/dL (H)).  Liver Function Tests: Recent Labs  Lab 09/08/20 2308 09/10/20 0926 09/11/20 0325 09/12/20 0038  09/13/20 0511  AST 27 64* 109* 49* 34  ALT 32 37 48* 38 35  ALKPHOS 65 46 50 38 46  BILITOT 2.7* 1.8* 1.6* 2.0* 0.9  PROT 7.6 5.8* 6.2* 5.2* 5.4*  ALBUMIN 4.3 3.4* 3.7 2.9* 3.0*     Recent Labs  Lab 09/10/20 1108  AMMONIA 23    Coagulation Profile: Recent Labs  Lab 09/10/20 1108  INR 1.2    Cardiac Enzymes: Recent Labs  Lab 09/09/20 0635 09/10/20 1108 09/11/20 0910 09/12/20 0038  CKTOTAL 263 2,578* 1,227* 493*     CBG: Recent Labs  Lab 09/12/20 1517 09/12/20 1947 09/13/20 0012 09/13/20 0329 09/13/20 0734  GLUCAP 227* 248* 144* 157* 181*    Thyroid Function Tests: Recent Labs    09/10/20 1108  FREET4 1.30*    Anemia Panel: Recent Labs    09/10/20 1108  VITAMINB12 718  FOLATE 20.7    Recent Results (from the past 240 hour(s))  Urine culture     Status: Abnormal   Collection Time: 09/08/20 11:35 PM   Specimen: Urine, Clean Catch  Result Value Ref Range Status   Specimen Description   Final    URINE, CLEAN CATCH Performed at [facility], [place] [address] [place], [place] [address]    Special Requests   Final    NONE Performed at [facility], [place] [address] [place], [place] [address]    Culture (A)  Final    <10,000 COLONIES/mL INSIGNIFICANT GROWTH Performed at [place] [address], [place], [place] [address]    Report Status 09/10/2020 FINAL  Final  Resp Panel by RT-PCR (Flu A&B, Covid) Nasopharyngeal Swab     Status: None   Collection Time: 09/08/20 11:41 PM   Specimen: Nasopharyngeal Swab; Nasopharyngeal(NP) swabs in vial transport medium  Result Value Ref Range Status   SARS Coronavirus 2 by RT PCR NEGATIVE NEGATIVE Final    Comment: (NOTE) SARS-CoV-2 target nucleic acids are NOT DETECTED.  The SARS-CoV-2 RNA is generally detectable in upper respiratory specimens during the acute phase of infection. The lowest concentration of SARS-CoV-2 viral copies this assay can detect  is 138 copies/mL. A negative result does not preclude SARS-Cov-2 infection and should not be used as  the sole basis for treatment or other patient management decisions. A negative result may occur with  improper specimen collection/handling, submission of specimen other than nasopharyngeal swab, presence of viral mutation(s) within the areas targeted by this assay, and inadequate number of viral copies(<138 copies/mL). A negative result must be combined with clinical observations, patient history, and epidemiological information. The expected result is Negative.  Fact Sheet for Patients:  EntrepreneurPulse.com.au  Fact Sheet for Healthcare Providers:  IncredibleEmployment.be  This test is no t yet approved or cleared by the [place] FDA and  has been authorized for detection and/or diagnosis of SARS-CoV-2 by FDA under an Emergency Use Authorization (EUA). This EUA will remain  in effect (meaning this test can be used) for the duration of the COVID-19 declaration under Section 564(b)(1) of the Act, 21 U.S.C.section 360bbb-3(b)(1), unless the authorization is terminated  or revoked sooner.       Influenza A by PCR NEGATIVE NEGATIVE Final   Influenza B by PCR NEGATIVE NEGATIVE Final    Comment: (NOTE) The Xpert Xpress SARS-CoV-2/FLU/RSV plus assay is intended as an aid in the diagnosis of influenza from Nasopharyngeal swab specimens and should not be used as a sole basis for treatment. Nasal washings and aspirates are unacceptable for Xpert Xpress SARS-CoV-2/FLU/RSV testing.  Fact Sheet for Patients: EntrepreneurPulse.com.au  Fact Sheet for Healthcare Providers: IncredibleEmployment.be  This test is not yet approved or cleared by the [place] FDA and has been authorized for detection and/or diagnosis of SARS-CoV-2 by FDA under an Emergency Use Authorization (EUA). This EUA will remain in effect  (meaning this test can be used) for the duration of the COVID-19 declaration under Section 564(b)(1) of the Act, 21 U.S.C. section 360bbb-3(b)(1), unless the authorization is terminated or revoked.  Performed at [facility], [place] [address], [place], [place] [address]   Blood culture (routine x 2)     Status: Abnormal (Preliminary result)   Collection Time: 09/09/20  1:03 AM   Specimen: BLOOD  Result Value Ref Range Status   Specimen Description   Final    BLOOD RIGHT WRIST Performed at [place] [address] [place], [place] [address]    Special Requests   Final    BOTTLES DRAWN AEROBIC AND ANAEROBIC Blood Culture adequate volume Performed at [place] [address], [place], [place] [address]    Culture  Setup Time   Final    GRAM POSITIVE COCCI IN CLUSTERS IN BOTH AEROBIC AND ANAEROBIC BOTTLES CRITICAL RESULT CALLED TO, READ BACK BY AND VERIFIED WITH: PHARMD MICHELLE LILLISTAN 09/09/2020 AT 2220 A.HUGHES    Culture (A)  Final    STAPHYLOCOCCUS HOMINIS CULTURE REINCUBATED FOR BETTER GROWTH Performed at [facility], [place] [address], [place], [place] [address]    Report Status PENDING  Incomplete  Blood Culture ID Panel (Reflexed)     Status: Abnormal   Collection Time: 09/09/20  1:03 AM  Result Value Ref Range Status   Enterococcus faecalis NOT DETECTED NOT DETECTED Final   Enterococcus Faecium NOT DETECTED NOT DETECTED Final   Listeria monocytogenes NOT DETECTED NOT DETECTED Final   Staphylococcus species DETECTED (A) NOT DETECTED Final    Comment: CRITICAL RESULT CALLED TO, READ BACK BY AND VERIFIED WITH: PHARMD MICHELLE LILLISTAN 09/09/2020 AT 2220 A.HUGHES    Staphylococcus aureus (BCID) NOT DETECTED NOT DETECTED Final   Staphylococcus epidermidis DETECTED (A) NOT DETECTED Final    Comment: Methicillin (oxacillin) resistant coagulase negative staphylococcus.  Possible blood culture contaminant  (unless isolated from more than one blood culture draw or clinical case suggests pathogenicity). No antibiotic treatment is indicated for blood  culture contaminants. CRITICAL RESULT CALLED TO, READ BACK BY AND VERIFIED WITH: PHARMD MICHELLE LILLISTAN 09/09/2020 AT 2220 A.HUGHES    Staphylococcus lugdunensis NOT DETECTED NOT DETECTED Final   Streptococcus species NOT DETECTED NOT DETECTED Final   Streptococcus agalactiae NOT DETECTED NOT DETECTED Final   Streptococcus pneumoniae NOT DETECTED NOT DETECTED Final   Streptococcus pyogenes NOT DETECTED NOT DETECTED Final   A.calcoaceticus-baumannii NOT DETECTED NOT DETECTED Final   Bacteroides fragilis NOT DETECTED NOT DETECTED Final   Enterobacterales NOT DETECTED NOT DETECTED Final   Enterobacter cloacae complex NOT DETECTED NOT DETECTED Final   Escherichia coli NOT DETECTED NOT DETECTED Final   Klebsiella aerogenes NOT DETECTED NOT DETECTED Final   Klebsiella oxytoca NOT DETECTED NOT DETECTED Final   Klebsiella pneumoniae NOT DETECTED NOT DETECTED Final   Proteus species NOT DETECTED NOT DETECTED Final   Salmonella species NOT DETECTED NOT DETECTED Final   Serratia marcescens NOT DETECTED NOT DETECTED Final   Haemophilus influenzae NOT DETECTED NOT DETECTED Final   Neisseria meningitidis NOT DETECTED NOT DETECTED Final   Pseudomonas aeruginosa NOT DETECTED NOT DETECTED Final   Stenotrophomonas maltophilia NOT DETECTED NOT DETECTED Final   Candida albicans NOT DETECTED NOT DETECTED Final   Candida auris NOT DETECTED NOT DETECTED Final   Candida glabrata NOT DETECTED NOT DETECTED Final   Candida krusei NOT DETECTED NOT DETECTED Final   Candida parapsilosis NOT DETECTED NOT DETECTED Final   Candida tropicalis NOT DETECTED NOT DETECTED Final   Cryptococcus neoformans/gattii NOT DETECTED NOT DETECTED Final   Methicillin resistance mecA/C DETECTED (A) NOT DETECTED Final    Comment: CRITICAL RESULT CALLED TO, READ BACK BY AND VERIFIED  WITH: PHARMD MICHELLE LILLISTAN 09/09/2020 AT 2220 A.HUGHES Performed at [facility], [place] [address], [place], [place] [address]   Blood culture (routine x 2)     Status: None (Preliminary result)   Collection Time: 09/09/20  1:08 AM   Specimen: BLOOD  Result Value Ref Range Status   Specimen Description   Final    BLOOD LEFT WRIST Performed at [place] [address] [place], [place] [address]    Special Requests   Final    BOTTLES DRAWN AEROBIC AND ANAEROBIC Blood Culture adequate volume Performed at [place] [address] [place], [place] [address]    Culture   Final    NO GROWTH 4 DAYS Performed at [facility], [place] [address], [place], [place] [address]    Report Status PENDING  Incomplete  MRSA PCR Screening     Status: Abnormal   Collection Time: 09/10/20  5:19 AM   Specimen: Nasal Mucosa; Nasopharyngeal  Result Value Ref Range Status   MRSA by PCR POSITIVE (A) NEGATIVE Final    Comment:        The GeneXpert MRSA Assay (FDA approved for NASAL specimens only), is one component of a comprehensive MRSA colonization surveillance program. It is not intended to diagnose MRSA infection nor to guide or monitor treatment for MRSA infections. RESULT CALLED TO, READ BACK BY AND VERIFIED WITH: HEAVNER,A. RN AT 1150 09/10/20 MULLINS,T Performed at [facility], [place] [address], [place], [place] [address]   Culture, blood (routine x 2)     Status: None (Preliminary result)   Collection Time: 09/10/20 11:03 AM  Specimen: BLOOD  Result Value Ref Range Status   Specimen Description   Final    BLOOD LEFT FOREARM Performed at [place] [address], [place], [place] [address]    Special Requests   Final    BOTTLES DRAWN AEROBIC ONLY Blood Culture results may not be optimal due to an inadequate volume of blood received in culture bottles Performed at [place] [address] [place], [place] [address]    Culture   Final    NO GROWTH 3 DAYS Performed at [facility], [place] [address], [place], [place] [address]    Report Status PENDING  Incomplete  Culture, blood (routine x 2)     Status: None (Preliminary result)   Collection Time: 09/10/20 11:03 AM   Specimen: BLOOD  Result Value Ref Range Status   Specimen Description   Final    BLOOD RIGHT ANTECUBITAL Performed at [place] [address] [place], [place] [address]    Special Requests   Final    BOTTLES DRAWN AEROBIC AND ANAEROBIC Blood Culture adequate volume Performed at [place] [address] [place], [place] [address]    Culture   Final    NO GROWTH 3 DAYS Performed at [facility], [place] [address], [place], [place] [address]    Report Status PENDING  Incomplete      Radiology Studies: DG Toe 2nd Left  Result Date: 09/11/2020 CLINICAL DATA:  Pain and bruising. EXAM: LEFT SECOND TOE COMPARISON:  Foot radiograph 652 FINDINGS: There is no evidence of fracture or dislocation. Mild degenerative change of the proximal interphalangeal joint with spurring. No erosion or bone destruction. Soft tissues are unremarkable. IMPRESSION: Mild degenerative change without acute findings of the second toe. Electronically Signed   By: Keith Rake M.D.   On: 09/11/2020 21:59   DG Toe 3rd Left  Result Date: 09/11/2020 CLINICAL DATA:  Pain and bruising. EXAM: LEFT THIRD TOE COMPARISON:  Foot radiograph 09/10/2020 FINDINGS: Lateral view limited by positioning and osseous overlap. There is no evidence of fracture or dislocation. There is no evidence of arthropathy or other focal bone abnormality. Soft tissues are unremarkable. IMPRESSION: Negative radiographs of the left third toe. Electronically Signed   By: Keith Rake M.D.   On: 09/11/2020 21:58   EEG adult  Result Date: 09/11/2020 Lora Havens, MD     09/11/2020  1:27  PM Patient Name: BRADLEE HEITMAN MRN: [id number] Epilepsy Attending: Lora Havens Referring Physician/Provider: Dr Margaretha Seeds Date: 09/11/2020 Duration: 1 hour 8 mins Patient history: 85 year old male with altered mental status.  EEG to evaluate for seizures. Level of alertness: Awake AEDs during EEG study: [place] Technical aspects: This EEG study was done with ceribell EEG system. Electrical activity was reviewed with a high frequency filter of 70Hz  and a low frequency filter of 1Hz . EEG data were recorded continuously and digitally stored. Description: The posterior dominant rhythm consists of 9 Hz activity of moderate voltage (25-35 uV) seen predominantly in posterior head regions, symmetric and reactive to eye opening and eye closing. EEG showed cintermittent generalized 3 to 6 Hz theta-delta slowing. Hyperventilation and photic stimulation were not performed.   ABNORMALITY - Intermittent slow, generalized IMPRESSION: This study is suggestive of mild diffuse encephalopathy, nonspecific etiology. No seizures or epileptiform discharges were seen throughout the recording. [place]       LOS: 3 days   [employer]  [facility] operational officer on [email or web address]  09/13/2020, 10:20 AM

## 2020-09-13 NOTE — Progress Notes (Signed)
ANTICOAGULATION CONSULT NOTE - Follow Up Consult  Pharmacy Consult for heparin infusion Indication: atrial fibrillation  Allergies  Allergen Reactions  . Oxycodone Anaphylaxis    Other reaction(s): Other (See Comments) Cannot recall specifics  . Iodinated Diagnostic Agents Hives    alleric to renografin,isovue & omnipaque, hives, requires 13 hr prep//a.calhoun, Onset Date: [id number]     . Iodine Hives and Rash    alleric to renografin,isovue & omnipaque, hives, requires 13 hr prep//a.calhoun, Onset Date: [id number] allergic to renografin,isovue & omnipaque, hives, requires 13 hr prep//a.calhoun, Onset Date: [id number]  . Linezolid Hives and Rash       . Methadone Hcl Other (See Comments)    HALLUCINATIONS   . Promethazine Other (See Comments)    Mental status change, DELIRIUM   Other reaction(s): erratic behavior Other reaction(s): Other (See Comments) Mental status change DELIRIUM (pulled out IV)  . Morphine Sulfate Other (See Comments)    Pt not sure about allergy   . Other Hives    Other reaction(s): erratic behanvior Other reaction(s): erratic behavior  . Oxycodone Hcl Other (See Comments)    Pt not sure about allergy   . Penicillins Other (See Comments)    Pt not sure about allergy   . Quetiapine Other (See Comments)    Pt not sure about allergy  Other reaction(s): Other (See Comments) Pt not sure about allergy  Pt not sure about allergy   . Statins     Other reaction(s): muscle weakness Other reaction(s): muscle weakness  . Zolpidem Other (See Comments)    Other reaction(s): erratic behanvior Other reaction(s): Delusions (intolerance) Altered mental status  . Atorvastatin Itching and Other (See Comments)    Tired, weakness   . Carbidopa-Levodopa Anxiety  . Chlorhexidine Gluconate [Chlorhexidine] Hives and Rash  . Clindamycin Rash  . Colesevelam Other (See Comments)    tired   . Doxazosin Rash  . Lovastatin Other (See Comments)    Tired, nervousness    . Rosuvastatin Other (See Comments)    Tired, weakness     Patient Measurements: Height: 5\' 4"  (162.6 cm) Weight: 86 kg (189 lb 9.5 oz) IBW/kg (Calculated) : 59.2 Heparin Dosing Weight: 76 kg  Vital Signs: Temp: 98.2 F (36.8 C) (06/08 0300) Temp Source: Oral (06/08 0300) BP: 151/84 (06/08 0600) Pulse Rate: 81 (06/08 0600)  Labs: Recent Labs    09/10/20 0926 09/10/20 1108 09/10/20 1242 09/10/20 1629 09/11/20 0325 09/11/20 0910 09/12/20 0038 09/12/20 1150 09/12/20 2039 09/13/20 0511  HGB 17.6*  --   --   --  17.4*  --  15.1  --   --   --   HCT 54.7*  --   --   --  54.4*  --  48.9  --   --   --   PLT 210  --   --   --  167  --  152  --   --   --   APTT  --  28  --   --   --   --   --   --   --   --   LABPROT  --  15.0  --   --   --   --   --   --   --   --   INR  --  1.2  --   --   --   --   --   --   --   --   HEPARINUNFRC  --   --   --    < >  --   --  0.16* 0.21* 0.29* 0.36  CREATININE 2.20*  --   --    < > 1.62*  --  1.53*  --   --  1.27*  CKTOTAL  --  2,578*  --   --   --  1,227* 493*  --   --   --   TROPONINIHS  --  388* 300*  --   --   --   --   --   --   --    < > = values in this interval not displayed.    Estimated Creatinine Clearance: 44.3 mL/min (A) (by C-G formula based on SCr of 1.27 mg/dL (H)).   Medications:  No anticoagulation prior to admission  Assessment: Pharmacy to dose heparin infusion for atrial fibrillation.  Hemoglobin slightly elevated and platelets WNL.  Baseline INR and aPTT WNL.  Was on heparin subq for dvt prophylaxis, last dose 6/5 1246.    09/13/2020:  Heparin level now therapeutic @ 0.36 after rate increased to 1500 units/hr  No bleeding or infusion related issues reported by RN  CBC- WNL Order to start apixaban for Afib per cardiology but may get LP today. D/w Dr Johnsie Cancel- continue bridge therapy with heparin drip until LP.  OK to wait until tomorrow/1 day after LP to start apixaban  Goal of Therapy:  Heparin level  0.3-0.7 units/ml Monitor platelets by anticoagulation protocol: Yes   Plan:  Continue IV heparin infusion to 1500 units/hr Daily heparin level & CBC while on heparin Monitor for any s/s bleeding Has orders to switch to apixaban per cards to start 1 day after LP-->f/u plans re: timing of LP  Netta Cedars, Pharm.D 09/13/2020 6:12 AM

## 2020-09-13 NOTE — Progress Notes (Addendum)
[facility] for heparin infusion >> eliquis Indication: atrial fibrillation  Allergies  Allergen Reactions  . Oxycodone Anaphylaxis    Other reaction(s): Other (See Comments) Cannot recall specifics  . Iodinated Diagnostic Agents Hives    alleric to renografin,isovue & omnipaque, hives, requires 13 hr prep//a.calhoun, Onset Date: [id number]     . Iodine Hives and Rash    alleric to renografin,isovue & omnipaque, hives, requires 13 hr prep//a.calhoun, Onset Date: [id number] allergic to renografin,isovue & omnipaque, hives, requires 13 hr prep//a.calhoun, Onset Date: [id number]  . Linezolid Hives and Rash       . Methadone Hcl Other (See Comments)    HALLUCINATIONS   . Promethazine Other (See Comments)    Mental status change, DELIRIUM   Other reaction(s): erratic behavior Other reaction(s): Other (See Comments) Mental status change DELIRIUM (pulled out IV)  . Morphine Sulfate Other (See Comments)    Pt not sure about allergy   . Other Hives    Other reaction(s): erratic behanvior Other reaction(s): erratic behavior  . Oxycodone Hcl Other (See Comments)    Pt not sure about allergy   . Penicillins Other (See Comments)    Pt not sure about allergy   . Quetiapine Other (See Comments)    Pt not sure about allergy  Other reaction(s): Other (See Comments) Pt not sure about allergy  Pt not sure about allergy   . Statins     Other reaction(s): muscle weakness Other reaction(s): muscle weakness  . Zolpidem Other (See Comments)    Other reaction(s): erratic behanvior Other reaction(s): Delusions (intolerance) Altered mental status  . Atorvastatin Itching and Other (See Comments)    Tired, weakness   . Carbidopa-Levodopa Anxiety  . Chlorhexidine Gluconate [Chlorhexidine] Hives and Rash  . Clindamycin Rash  . Colesevelam Other (See Comments)    tired   . Doxazosin Rash  . Lovastatin Other (See Comments)    Tired, nervousness   .  Rosuvastatin Other (See Comments)    Tired, weakness     Patient Measurements: Height: 5\' 4"  (162.6 cm) Weight: 86 kg (189 lb 9.5 oz) IBW/kg (Calculated) : 59.2 Heparin Dosing Weight: 76 kg  Vital Signs: Temp: 97.6 F (36.4 C) (06/08 0720) Temp Source: Oral (06/08 0720) BP: 150/78 (06/08 0800) Pulse Rate: 80 (06/08 0800)  Labs: Recent Labs    09/10/20 1242 09/10/20 1629 09/11/20 0325 09/11/20 0910 09/12/20 0038 09/12/20 1150 09/12/20 2039 09/13/20 0511  HGB  --    < > 17.4*  --  15.1  --   --  14.6  HCT  --   --  54.4*  --  48.9  --   --  47.0  PLT  --   --  167  --  152  --   --  111*  HEPARINUNFRC  --    < >  --   --  0.16* 0.21* 0.29* 0.36  CREATININE  --    < > 1.62*  --  1.53*  --   --  1.27*  CKTOTAL  --   --   --  1,227* 493*  --   --   --   TROPONINIHS 300*  --   --   --   --   --   --   --    < > = values in this interval not displayed.    Estimated Creatinine Clearance: 44.3 mL/min (A) (by C-G formula based on SCr of 1.27 mg/dL (H)).   Medications:  No anticoagulation prior to admission  Assessment: Pharmacy to dose heparin infusion for atrial fibrillation.  Hemoglobin slightly elevated and platelets WNL.  Baseline INR and aPTT WNL.  Was on heparin subq for dvt prophylaxis, last dose 6/5 1246.    09/13/2020:  OK to defer LP per neurology  Per [facility], transition to apixaban  CBC WNL  SCr 1.27, 84 y/o, Wt: 86 kg  Plan:  Initiate apixaban 5 mg bid D/C heparin infusion at time of first dose Follow SCr closely  Ulice Dash D  09/13/2020 11:40 AM

## 2020-09-13 NOTE — Progress Notes (Signed)
Neurology Progress Note  Brief HPI: 85 y.o. with PMHx of HTN, type 2 DM with neuropathy, CKD, OSA, RLS, anxiety with panic disorder, and parkinsonism who presented to the ED 09/09/2020 for evaluation of diaphoresis and anxiety with acute encephalopathy. He was initially found to have a leukocytosis and was transferred to the ICU for worsening agitation requiring sedation.   Interval hx: Continued frequent administration of deliriogenic prn medications incl diazepam, dilaudid, hydroxyzine for agitation. Precedex weaned off. Fulminant UTI on UA today (not present on admission) Heparin infusion initiated today per pharmacy for atrial fibrillation   Exam: Vitals:   09/13/20 0800 09/13/20 1155  BP: (!) 150/78   Pulse: 80   Resp: 17   Temp:  (!) 97.4 F (36.3 C)  SpO2: 97%    Gen: Comfortably laying in bed, in no acute distress Resp: non-labored breathing, no respiratory distress, spontaneous weak cough noted Abd: soft, non-tender Genitourinary: Urinary frequency and urgency noted during assessment  Neuro: Mental Status: Sleeping initially on examination, wakes to voice, oriented to person and age after repeatedly giving his birth date for his age. He is unable to state the correct year and consistently states "19" when asked what the current year is. When asked if it was 2022 he states "no". When asked the month he states "it's time to go fishing". States incorrectly that he is in [facility]. He is unable to provide a clear or coherent history of present illness.  Poor attention is noted.  Naming, fluency, comprehension, and repetition are intact.  There is no evidence of aphasia or neglect on examination.  Cranial Nerves: PERRL 4 mm / brisk, EOMI without ptosis, visual fields are full, sensation to light touch intact and symmetric to face, hearing is intact to voice, shoulders shrug symmetrically, phonation is intact, palate rises symmetrically, tongue protrudes midline.   Motor: Right upper extremity 4/5 strength with antigravity movement, left upper extremity with 3/5 strength with some antigravity movement but with drift on examination. Right hand grip strength > left hand grip strength.  Bilateral lower extremity weakness noted on examination with pain limited assessment of right lower extremity. Both lower extremities with immediate drift to bed on assessment.  Sensory: Sensation to light touch intact and symmetric in bilateral upper and lower extremities.  Gait: Deferred  Pertinent Labs: CBC    Component Value Date/Time   WBC 9.0 09/13/2020 0511   RBC 5.14 09/13/2020 0511   HGB 14.6 09/13/2020 0511   HGB 13.2 11/12/2016 1129   HCT 47.0 09/13/2020 0511   HCT 41.9 11/12/2016 1129   PLT 111 (L) 09/13/2020 0511   PLT 185 11/12/2016 1129   MCV 91.4 09/13/2020 0511   MCV 83 11/12/2016 1129   MCH 28.4 09/13/2020 0511   MCHC 31.1 09/13/2020 0511   RDW 14.2 09/13/2020 0511   RDW 15.2 11/12/2016 1129   LYMPHSABS 0.9 09/12/2020 0038   LYMPHSABS 1.4 11/12/2016 1129   MONOABS 0.9 09/12/2020 0038   EOSABS 0.1 09/12/2020 0038   EOSABS 0.2 11/12/2016 1129   BASOSABS 0.1 09/12/2020 0038   BASOSABS 0.0 11/12/2016 1129   CMP     Component Value Date/Time   NA 138 09/13/2020 0511   NA 143 11/12/2016 1128   K 4.4 09/13/2020 0511   CL 108 09/13/2020 0511   CO2 22 09/13/2020 0511   GLUCOSE 177 (H) 09/13/2020 0511   GLUCOSE 136 (H) 03/18/2006 1245   BUN 30 (H) 09/13/2020 0511   BUN 27 11/12/2016 1128  CREATININE 1.27 (H) 09/13/2020 0511   CALCIUM 8.7 (L) 09/13/2020 0511   CALCIUM 10.2 09/19/2006 0100   PROT 5.4 (L) 09/13/2020 0511   PROT 6.9 11/12/2016 1128   ALBUMIN 3.0 (L) 09/13/2020 0511   ALBUMIN 4.3 11/12/2016 1128   AST 34 09/13/2020 0511   ALT 35 09/13/2020 0511   ALKPHOS 46 09/13/2020 0511   BILITOT 0.9 09/13/2020 0511   BILITOT 0.4 11/12/2016 1128   GFRNONAA 56 (L) 09/13/2020 0511   GFRAA 56 (L) 04/27/2019 1015   Urinalysis     Component Value Date/Time   COLORURINE YELLOW 09/12/2020 2253   APPEARANCEUR HAZY (A) 09/12/2020 2253   LABSPEC 1.005 09/12/2020 2253   PHURINE 6.0 09/12/2020 2253   GLUCOSEU >=500 (A) 09/12/2020 2253   HGBUR LARGE (A) 09/12/2020 2253   BILIRUBINUR NEGATIVE 09/12/2020 2253   KETONESUR NEGATIVE 09/12/2020 2253   PROTEINUR NEGATIVE 09/12/2020 2253   UROBILINOGEN 0.2 12/04/2013 0210   NITRITE NEGATIVE 09/12/2020 2253   LEUKOCYTESUR LARGE (A) 09/12/2020 2253   Drugs of Abuse     Component Value Date/Time   LABOPIA NONE DETECTED 09/08/2020 2335   COCAINSCRNUR NONE DETECTED 09/08/2020 2335   LABBENZ NONE DETECTED 09/08/2020 2335   AMPHETMU NONE DETECTED 09/08/2020 2335   THCU NONE DETECTED 09/08/2020 2335   LABBARB NONE DETECTED 09/08/2020 2335    Lab Results  Component Value Date   VITAMINB12 718 09/10/2020   Lab Results  Component Value Date   TSH 1.094 09/09/2020   Imaging Reviewed: CT Head without contrast 09/10/2020: Atrophy with small vessel chronic ischemic changes of deep cerebral white matter. Old lacunar infarcts at basal ganglia bilaterally. No acute intracranial abnormalities. Small amount of nonspecific fluid within sphenoid spinous.  EEG 09/11/2020: This study is suggestive of mild diffuse encephalopathy, nonspecific etiology. No seizures or epileptiform discharges were seen throughout the recording.  Assessment/Plan: 85 yo male that presented to the ED 09/09/2020 for evaluation of diaphoresis, lethargy, anxiety and poor po intake. He had increased encephalopathy as evidenced by altered mental status and agitation and required sedation in ICU.  His acute encephalopathy thought to be multifactorial due to toxic metabolic encephalopathy and polypharmacy with addition of Cymbalta and Buspar 2 weeks prior to admission. Serotonin syndrome was considered but his exam was not consistent with this. - Favor multifactorial etiology given ongoing polypharmacy with multiple  deliriogenic medications, concurrent infection (not present on admission), delirium. I called IR to discuss status of LP, they said they had him on table Mon to do it and wife stated she did not want him to undergo procedure. I think that is fine given his leukocytosis is not v impressive and he has not been febrile or c/o h/a. - EEG 09/11/2020 was negative for seizures, so doubt his AMS was due to seizures - F/u extended drugs of abuse screen - Minimize deliriogenic medications - dilaudid, versed, and hydroxyzine are all likely to worsen encephalopathy/agitation in octagenarian with underlying dementia. Recommend trial of olanzapine 2.5mg  bid, uptitrated prn.  - Consider psychiatry consult for further mgmt of agitation. At this point there is no further inpatient neurologic workup recommended. Neurology will not continue to actively follow, but please re-engage if new neurologic concerns arise.   Anibal Henderson, AGACNP-BC [facility] [phone]  Neurology Attending Attestation   I examined the patient and discussed plan with Ms. Toberman NP. Above note has been edited by me to reflect my findings and recommendations.    Su Monks, MD [facility] [phone]   If  7pm- 7am, please page neurology on call as listed in [place].

## 2020-09-13 NOTE — Progress Notes (Addendum)
Progress Note  Patient Name: Jason Hartman Date of Encounter: 09/13/2020  [facility] Cardiologist: Dr. Gardiner Rhyme  Subjective   Alert but not oriented (unable to tell, year, location, or who is the current president, oriented to self only). Denies any CP or SOB.   Inpatient Medications    Scheduled Meds:  amiodarone  200 mg Oral Daily   diazepam  5 mg Intravenous Q4H   insulin aspart  0-9 Units Subcutaneous Q4H   mouth rinse  15 mL Mouth Rinse BID   metoprolol succinate  50 mg Oral Daily   mupirocin ointment  1 application Nasal BID   phenazopyridine  200 mg Oral TID WC   Rotigotine  1 patch Transdermal QPM   thiamine  100 mg Oral Daily   Continuous Infusions:  sodium chloride     sodium chloride     cefTRIAXone (ROCEPHIN)  IV Stopped (09/13/20 0026)   dextrose 5% lactated ringers with KCl 20 mEq/L 75 mL/hr at 09/13/20 0356   heparin 1,650 Units/hr (09/13/20 0607)   PRN Meds: [CANCELED] Place/Maintain arterial line **AND** sodium chloride, acetaminophen **OR** acetaminophen, diazepam, diphenhydrAMINE-zinc acetate, hydrALAZINE, HYDROmorphone (DILAUDID) injection, hydrOXYzine, naLOXone (NARCAN)  injection, ofloxacin, ondansetron **OR** ondansetron (ZOFRAN) IV, senna-docusate, technetium albumin aggregated   Vital Signs    Vitals:   09/13/20 0439 09/13/20 0500 09/13/20 0600 09/13/20 0720  BP:  (!) 144/59 (!) 151/84   Pulse: 83 85 81   Resp: 15 15 (!) 21   Temp:    97.6 F (36.4 C)  TempSrc:    Oral  SpO2: 95% 97% 99%   Weight:      Height:        Intake/Output Summary (Last 24 hours) at 09/13/2020 0853 Last data filed at 09/13/2020 1017 Gross per 24 hour  Intake 2845.38 ml  Output 2851 ml  Net -5.62 ml   Last 3 Weights 09/13/2020 09/12/2020 09/11/2020  Weight (lbs) 189 lb 9.5 oz 188 lb 0.8 oz 184 lb 11.9 oz  Weight (kg) 86 kg 85.3 kg 83.8 kg      Telemetry    NSR with no significant atrial fibrillation or atrial flutter overnight.  - Personally Reviewed  ECG     Sinus rhythm with PACs, 09/10/2020 - Personally Reviewed  Physical Exam   GEN: confused. Only moving very slowly Neck: No JVD Cardiac: RRR, no murmurs, rubs, or gallops.  Respiratory: Clear to auscultation bilaterally. GI: Soft, nontender, non-distended  MS: No edema; No deformity. Neuro:  unable to assess, depressed movement.  Psych: flat   Labs    High Sensitivity Troponin:   Recent Labs  Lab 09/08/20 2308 09/09/20 0400 09/10/20 1108 09/10/20 1242  TROPONINIHS 28* 32* 388* 300*      Chemistry Recent Labs  Lab 09/11/20 0325 09/12/20 0038 09/13/20 0511  NA 140 132* 138  K 3.5 3.9 4.4  CL 106 102 108  CO2 25 23 22   GLUCOSE 103* 164* 177*  BUN 49* 42* 30*  CREATININE 1.62* 1.53* 1.27*  CALCIUM 8.8* 8.2* 8.7*  PROT 6.2* 5.2* 5.4*  ALBUMIN 3.7 2.9* 3.0*  AST 109* 49* 34  ALT 48* 38 35  ALKPHOS 50 38 46  BILITOT 1.6* 2.0* 0.9  GFRNONAA 42* 45* 56*  ANIONGAP 9 7 8      Hematology Recent Labs  Lab 09/11/20 0325 09/12/20 0038 09/13/20 0511  WBC 16.8* 13.6* 9.0  RBC 6.09* 5.28 5.14  HGB 17.4* 15.1 14.6  HCT 54.4* 48.9 47.0  MCV 89.3 92.6  91.4  MCH 28.6 28.6 28.4  MCHC 32.0 30.9 31.1  RDW 14.6 14.3 14.2  PLT 167 152 111*    BNP Recent Labs  Lab 09/10/20 1108  BNP 95.6     DDimer  Recent Labs  Lab 09/09/20 0830  DDIMER 1.87*     Radiology    DG Toe 2nd Left  Result Date: 09/11/2020 CLINICAL DATA:  Pain and bruising. EXAM: LEFT SECOND TOE COMPARISON:  Foot radiograph 652 FINDINGS: There is no evidence of fracture or dislocation. Mild degenerative change of the proximal interphalangeal joint with spurring. No erosion or bone destruction. Soft tissues are unremarkable. IMPRESSION: Mild degenerative change without acute findings of the second toe. Electronically Signed   By: Keith Rake M.D.   On: 09/11/2020 21:59   DG Toe 3rd Left  Result Date: 09/11/2020 CLINICAL DATA:  Pain and bruising. EXAM: LEFT THIRD TOE COMPARISON:  Foot radiograph  09/10/2020 FINDINGS: Lateral view limited by positioning and osseous overlap. There is no evidence of fracture or dislocation. There is no evidence of arthropathy or other focal bone abnormality. Soft tissues are unremarkable. IMPRESSION: Negative radiographs of the left third toe. Electronically Signed   By: Keith Rake M.D.   On: 09/11/2020 21:58   EEG adult  Result Date: 09/11/2020 Lora Havens, MD     09/11/2020  1:27 PM Patient Name: Jason Hartman MRN: [id number] Epilepsy Attending: Lora Havens Referring Physician/Provider: Dr Margaretha Seeds Date: 09/11/2020 Duration: 1 hour 8 mins Patient history: 85 year old male with altered mental status.  EEG to evaluate for seizures. Level of alertness: Awake AEDs during EEG study: Val[place] Technical aspects: This EEG study was done with ceribell EEG system. Electrical activity was reviewed with a high frequency filter of 70Hz  and a low frequency filter of 1Hz . EEG data were recorded continuously and digitally stored. Description: The posterior dominant rhythm consists of 9 Hz activity of moderate voltage (25-35 uV) seen predominantly in posterior head regions, symmetric and reactive to eye opening and eye closing. EEG showed cintermittent generalized 3 to 6 Hz theta-delta slowing. Hyperventilation and photic stimulation were not performed.   ABNORMALITY - Intermittent slow, generalized IMPRESSION: This study is suggestive of mild diffuse encephalopathy, nonspecific etiology. No seizures or epileptiform discharges were seen throughout the recording. Lora Havens    Cardiac Studies   Echo 09/09/2020  1. Left ventricular ejection fraction, by estimation, is 55 to 60%. The  left ventricle has normal function. The left ventricle has no regional  wall motion abnormalities. There is mild left ventricular hypertrophy.  Left ventricular diastolic parameters  are consistent with Grade I diastolic dysfunction (impaired relaxation).   2. Right ventricular  systolic function is normal. The right ventricular  size is normal. Tricuspid regurgitation signal is inadequate for assessing  PA pressure.   3. Left atrial size was mildly dilated.   4. The mitral valve is grossly normal. Trivial mitral valve  regurgitation.   5. The aortic valve is tricuspid. There is mild calcification of the  aortic valve. Aortic valve regurgitation is trivial. Mild aortic valve  sclerosis is present, with no evidence of aortic valve stenosis.   Patient Profile     85 y.o. male with PMH of OSA, parkinson's disease, CKD, and IDDM who presented with anxiety and diaphoresis after stopping Xanax. He was noted to be in sinus tach with PVCs on arrival. Echo showed normal EF. He went into atrial flutter during this admission.  Assessment & Plan  1. Atrial flutter with RVR  - Echo shows normal EF  - continue amiodarone and metoprolol.   - more lethargic today, alert but not oriented. Plan is to switch to Eliquis 5mg  BID today. Eventually, medication will need to be managed by SNF. Patient cannot manage his own medications.  2. Elevated troponin: suspect demand ischemia in the setting of acute illness and tachycardia  3. Acute on chronic renal insufficiency: renal function back to baseline, Cr 1.27 this morning.   4. Confusion: CT of head, no acute abnormality.       For questions or updates, please contact [place] Please consult [email or web address] for contact info under        Signed, Almyra Deforest, [place]  09/13/2020, 8:53 AM    Patient examined chart reviewed. More alert but still with confusion Maintaining NSR on amiodarone and metroprolol To start eliquis and transition away from heparin now.   Jenkins Rouge MD Leonidas Romberg

## 2020-09-14 ENCOUNTER — Inpatient Hospital Stay (HOSPITAL_COMMUNITY): Payer: Medicare Other

## 2020-09-14 DIAGNOSIS — I480 Paroxysmal atrial fibrillation: Secondary | ICD-10-CM

## 2020-09-14 DIAGNOSIS — R4182 Altered mental status, unspecified: Secondary | ICD-10-CM

## 2020-09-14 LAB — CBC
HCT: 47.4 % (ref 39.0–52.0)
Hemoglobin: 14.7 g/dL (ref 13.0–17.0)
MCH: 28.5 pg (ref 26.0–34.0)
MCHC: 31 g/dL (ref 30.0–36.0)
MCV: 91.9 fL (ref 80.0–100.0)
Platelets: 125 10*3/uL — ABNORMAL LOW (ref 150–400)
RBC: 5.16 MIL/uL (ref 4.22–5.81)
RDW: 14.4 % (ref 11.5–15.5)
WBC: 8.9 10*3/uL (ref 4.0–10.5)
nRBC: 0 % (ref 0.0–0.2)

## 2020-09-14 LAB — BASIC METABOLIC PANEL
Anion gap: 5 (ref 5–15)
BUN: 21 mg/dL (ref 8–23)
CO2: 25 mmol/L (ref 22–32)
Calcium: 8.8 mg/dL — ABNORMAL LOW (ref 8.9–10.3)
Chloride: 107 mmol/L (ref 98–111)
Creatinine, Ser: 1 mg/dL (ref 0.61–1.24)
GFR, Estimated: >60 mL/min (ref >60)
Glucose, Bld: 178 mg/dL — ABNORMAL HIGH (ref 70–99)
Potassium: 4.2 mmol/L (ref 3.5–5.1)
Sodium: 137 mmol/L (ref 135–145)

## 2020-09-14 LAB — GLUCOSE, CAPILLARY
Glucose-Capillary: 184 mg/dL — ABNORMAL HIGH (ref 70–99)
Glucose-Capillary: 189 mg/dL — ABNORMAL HIGH (ref 70–99)
Glucose-Capillary: 157 mg/dL — ABNORMAL HIGH (ref 70–99)
Glucose-Capillary: 223 mg/dL — ABNORMAL HIGH (ref 70–99)
Glucose-Capillary: 255 mg/dL — ABNORMAL HIGH (ref 70–99)
Glucose-Capillary: 261 mg/dL — ABNORMAL HIGH (ref 70–99)
Glucose-Capillary: 243 mg/dL — ABNORMAL HIGH (ref 70–99)

## 2020-09-14 LAB — CULTURE, BLOOD (ROUTINE X 2)
Culture: NO GROWTH
Special Requests: ADEQUATE

## 2020-09-14 LAB — VARICELLA-ZOSTER BY PCR: Varicella-Zoster, PCR: NEGATIVE

## 2020-09-14 IMAGING — CT CT ABD-PELV W/O CM
2 of 4 series · 16 of 46 positions shown, 18 images · non-contrast
Comparison: Ultrasound 5 days ago.  CT abdomen [DATE].

CLINICAL DATA: Acute non localized abdominal pain.

EXAM:
CT ABDOMEN AND PELVIS WITHOUT CONTRAST
TECHNIQUE: Multidetector CT imaging of the abdomen and pelvis was performed
following the standard protocol without IV contrast.

[Series 2: axial st · axial · 0.84mm/px · z∈[-642,-182]mm · 13 of 104 slices shown, 15 images]
[im 6/104  soft-tissue]
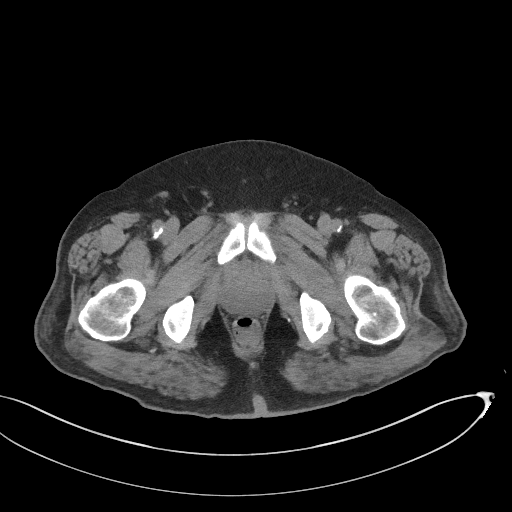
[im 6/104  bone]
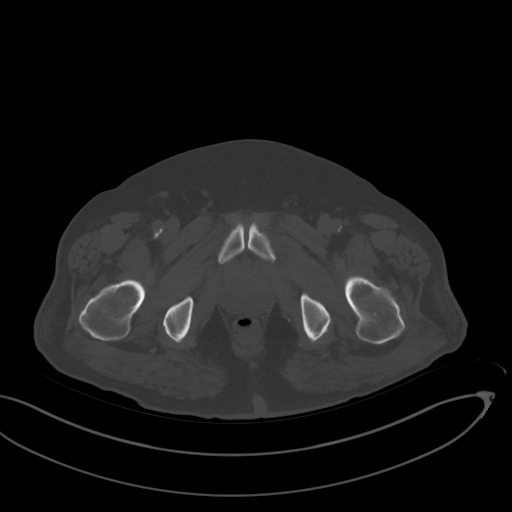
[im 12/104  soft-tissue]
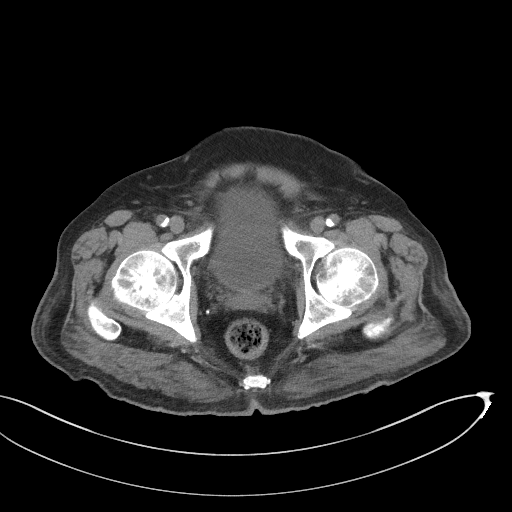
[im 23/104  soft-tissue]
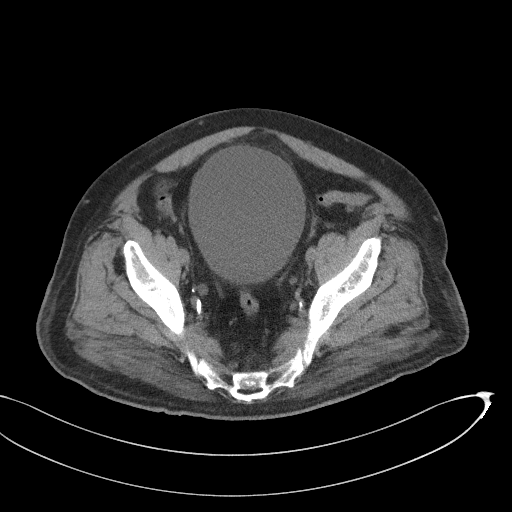
[im 29/104  soft-tissue]
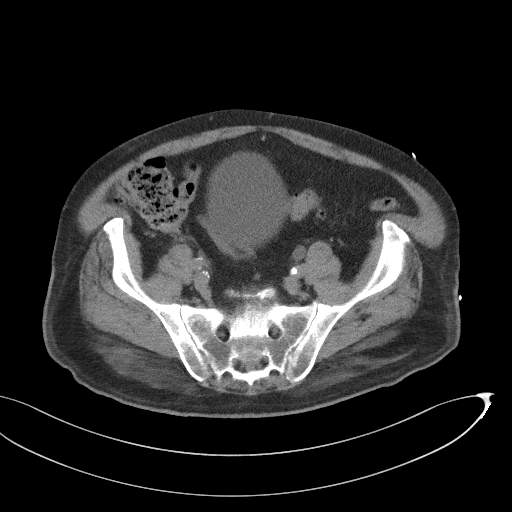
[im 35/104  soft-tissue]
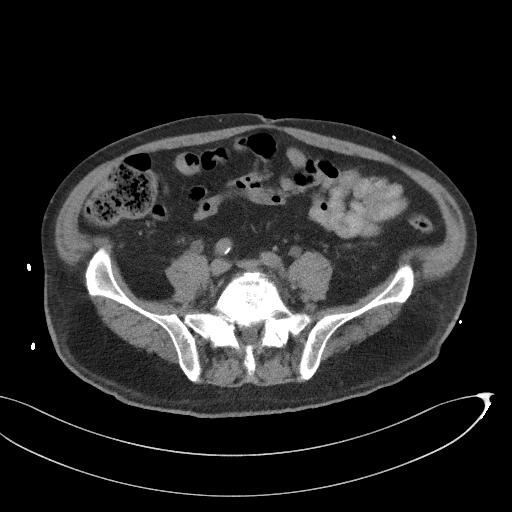
[im 46/104  soft-tissue]
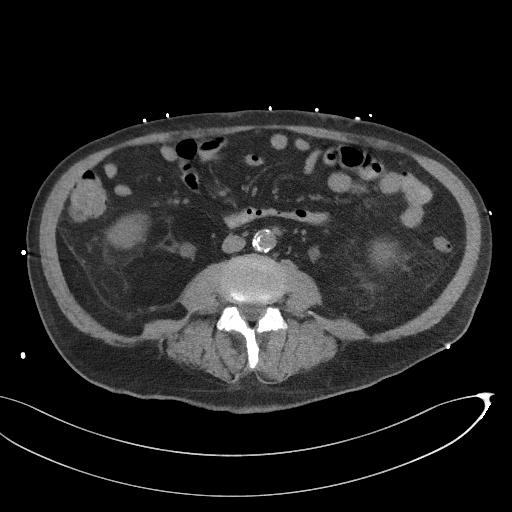
[im 52/104  soft-tissue]
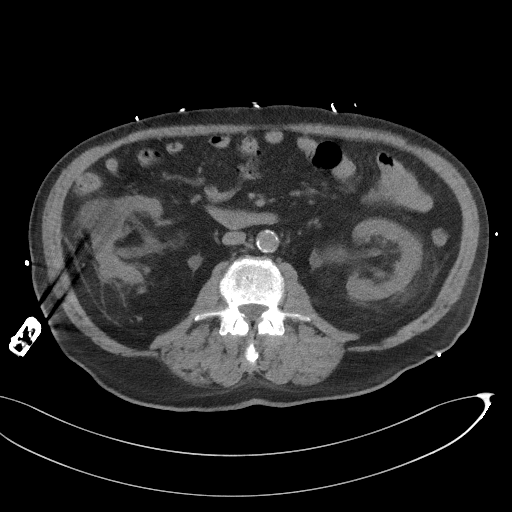
[im 58/104  soft-tissue]
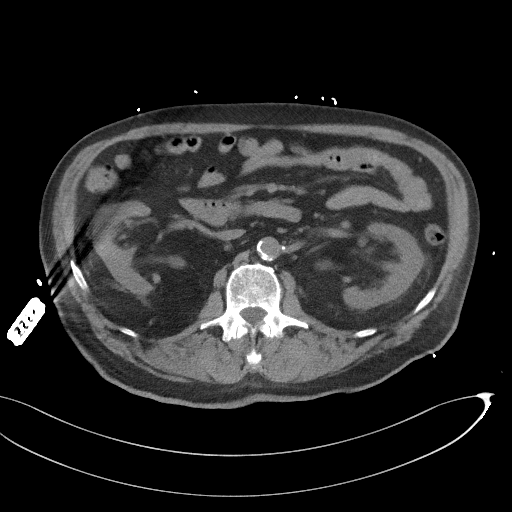
[im 69/104  soft-tissue]
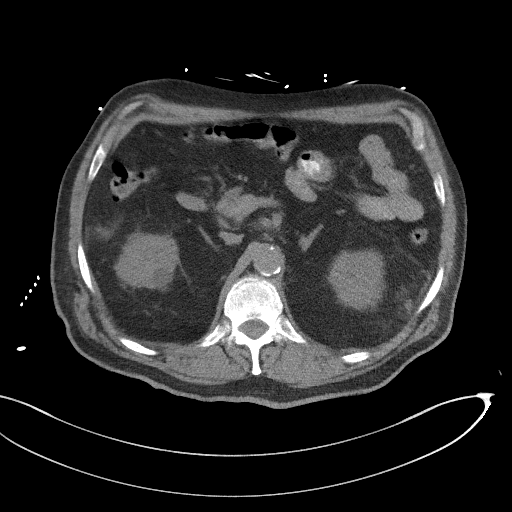
[im 69/104  bone]
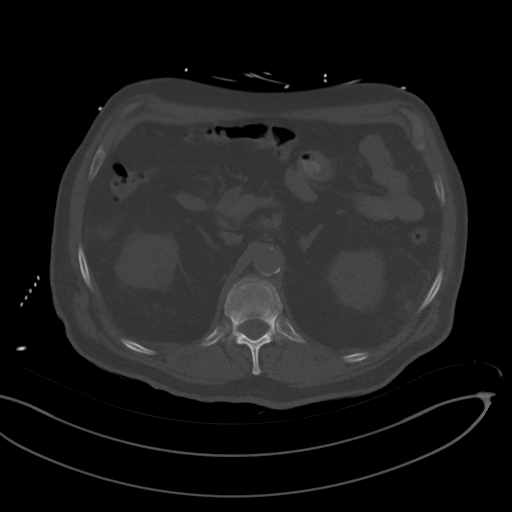
[im 75/104  soft-tissue]
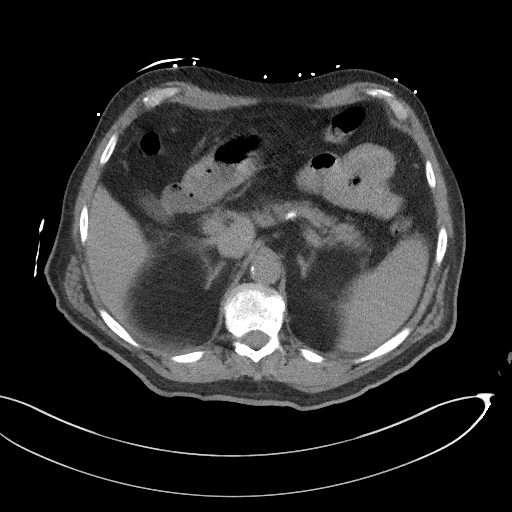
[im 81/104  soft-tissue]
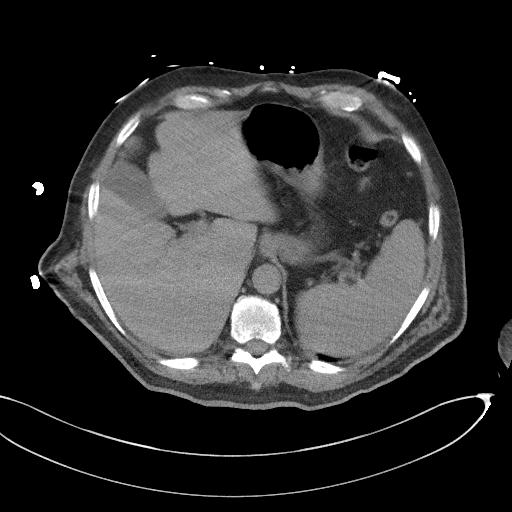
[im 92/104  soft-tissue]
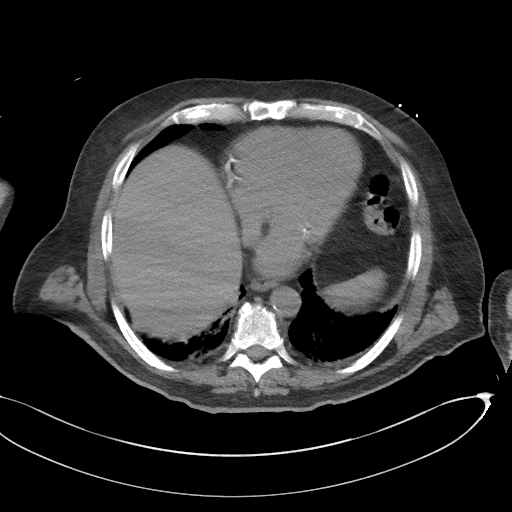
[im 98/104  soft-tissue]
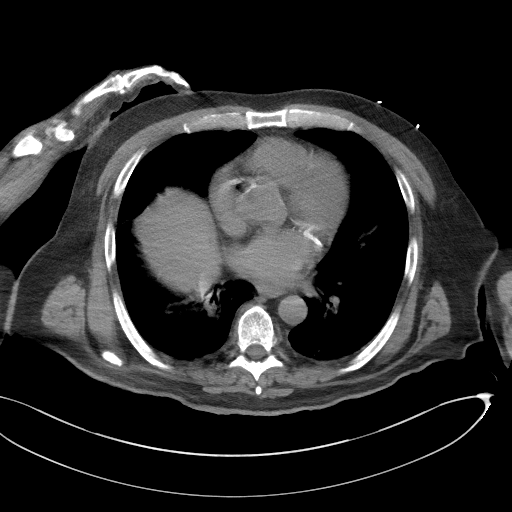

[Series 4: coronal st · coronal · 0.84mm/px · 3 of 100 slices shown]
[im 34/100  soft-tissue]
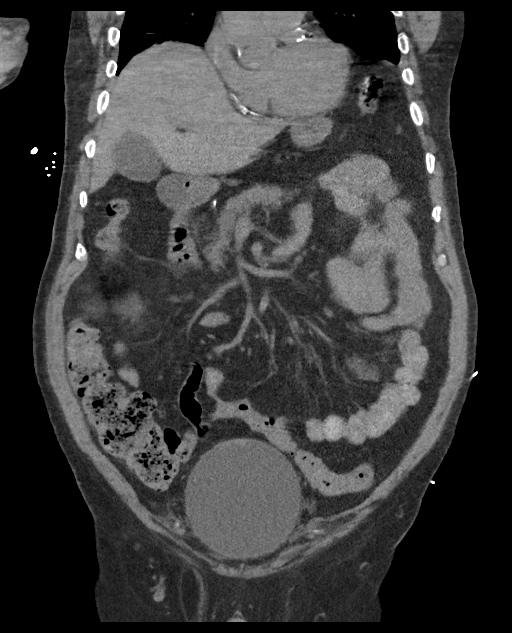
[im 45/100  soft-tissue]
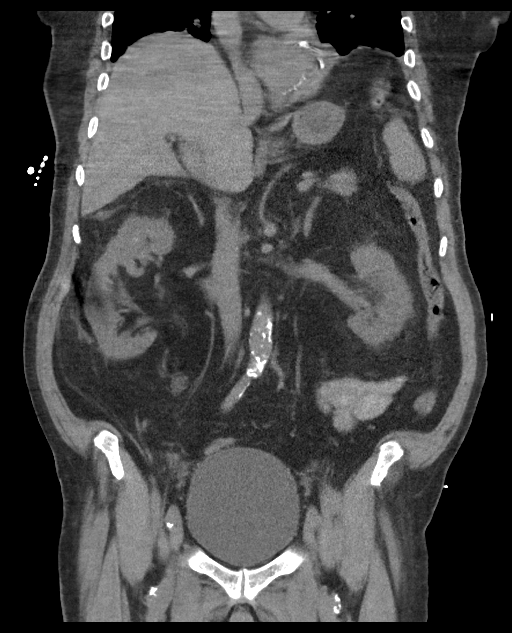
[im 56/100  soft-tissue]
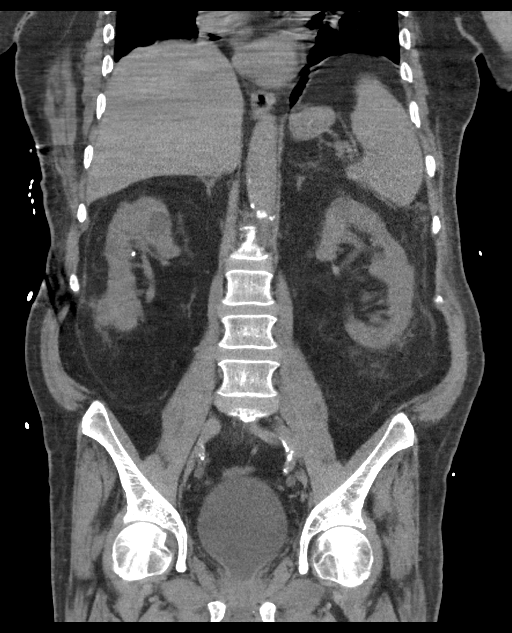

[16 of 46 positions shown; findings below may reference images not displayed]

FINDINGS: Lower chest: Mild atelectasis at the lung bases which obscures the
small nodules discussed on the previous exam. No pleural effusion.

Hepatobiliary: No liver abnormality seen without contrast. No
calcified gallstones.

Pancreas: Normal

Spleen: Normal

Adrenals/Urinary Tract: Adrenal glands are normal. Right kidney
shows a few punctate nonobstructing calculi. Renal cysts are present
as seen previously, the largest in the lateral midportion measuring
3.7 cm in diameter. There is hydroureteronephrosis with the ureter
dilated all the way to the bladder. No obstructing lesion seen at
the UVJ. Left kidney shows a few small cysts as seen previously.
Fullness of the renal collecting system in ureter on this side as
well all the way to the bladder, without obstructing lesion. The
bladder is distended. No obstructing lesion evident within the
prostate or proximal penile urethra.

Stomach/Bowel: Stomach is normal. Small intestine is normal. Colon
is normal.

Vascular/Lymphatic: Aortic atherosclerosis. No aneurysm. IVC is
normal. No retroperitoneal adenopathy.

Reproductive: Negative

Other: No free fluid or air.

Musculoskeletal: Chronic lower lumbar degenerative changes.
IMPRESSION: Distended bladder. This could be neurogenic or due to relative
outlet obstruction. Backflow dilatation of the renal collecting
systems and ureters without evidence of obstructing lesion. Chronic
renal cysts. Two nonobstructing renal calculi on the right.

Atelectasis at both lung bases. Cannot evaluate the small peripheral
densities described in the left lower lobe on the study last
[REDACTED].

Aortic atherosclerosis.

## 2020-09-14 MED ORDER — IRBESARTAN 300 MG PO TABS
300.0000 mg | ORAL_TABLET | Freq: Every day | ORAL | Status: DC
  Administered 2020-09-14 – 2020-09-15 (×2): 300 mg via ORAL
  Filled 2020-09-14 (×3): qty 1

## 2020-09-14 MED ORDER — BARIUM SULFATE 2.1 % PO SUSP: 1000.0000 mL | Freq: Once | ORAL | Status: DC

## 2020-09-14 MED ORDER — METOPROLOL SUCCINATE ER 25 MG PO TB24
100.0000 mg | ORAL_TABLET | Freq: Every day | ORAL | Status: DC
  Administered 2020-09-14 – 2020-09-15 (×2): 100 mg via ORAL
  Filled 2020-09-14 (×2): qty 4

## 2020-09-14 MED ORDER — DIAZEPAM 5 MG PO TABS
5.0000 mg | ORAL_TABLET | Freq: Four times a day (QID) | ORAL | Status: DC
  Administered 2020-09-14 – 2020-09-15 (×6): 5 mg via ORAL
  Filled 2020-09-14 (×6): qty 1

## 2020-09-14 NOTE — Progress Notes (Addendum)
Progress Note  Patient Name: Jason Hartman Date of Encounter: 09/14/2020  [facility] Cardiologist: None [place] [facility]   Subjective   No chest pain, pt with lethargy, wakes to some questions.   Inpatient Medications    Scheduled Meds:  amiodarone  200 mg Oral Daily   apixaban  5 mg Oral BID   diazepam  5 mg Intravenous Q4H   insulin aspart  0-9 Units Subcutaneous Q4H   mouth rinse  15 mL Mouth Rinse BID   metoprolol succinate  50 mg Oral Daily   mupirocin ointment  1 application Nasal BID   phenazopyridine  200 mg Oral TID WC   Rotigotine  1 patch Transdermal QPM   thiamine  100 mg Oral Daily   Continuous Infusions:  sodium chloride     sodium chloride     cefTRIAXone (ROCEPHIN)  IV Stopped (09/14/20 0028)   dextrose 5% lactated ringers with KCl 20 mEq/L 75 mL/hr at 09/14/20 0600   PRN Meds: [CANCELED] Place/Maintain arterial line **AND** sodium chloride, acetaminophen **OR** acetaminophen, diazepam, diphenhydrAMINE-zinc acetate, hydrALAZINE, HYDROmorphone (DILAUDID) injection, naLOXone (NARCAN)  injection, ofloxacin, ondansetron **OR** ondansetron (ZOFRAN) IV, senna-docusate, technetium albumin aggregated   Vital Signs    Vitals:   09/14/20 0443 09/14/20 0500 09/14/20 0600 09/14/20 0700  BP:  (!) 164/70 (!) 181/87 (!) 187/81  Pulse:  75 79 77  Resp:  (!) 23 (!) 30 (!) 31  Temp:      TempSrc:      SpO2:  97% 97% 97%  Weight: 84.3 kg     Height:        Intake/Output Summary (Last 24 hours) at 09/14/2020 0756 Last data filed at 09/14/2020 0630 Gross per 24 hour  Intake 2073.48 ml  Output 1750 ml  Net 323.48 ml   Last 3 Weights 09/14/2020 09/13/2020 09/12/2020  Weight (lbs) 185 lb 13.6 oz 189 lb 9.5 oz 188 lb 0.8 oz  Weight (kg) 84.3 kg 86 kg 85.3 kg      Telemetry    SR  - Personally Reviewed  ECG    No new - Personally Reviewed  Physical Exam   GEN: No acute distress.  Has lethargy, answers questions Neck: No JVD Cardiac: RRR, no murmurs,  rubs, or gallops.  Respiratory: Clear to auscultation bilaterally. GI: Soft, nontender, non-distended  MS: No edema; No deformity. Neuro:  Nonfocal - oriented to place and person. Did not answer what month. Psych: Normal affect   Labs    High Sensitivity Troponin:   Recent Labs  Lab 09/08/20 2308 09/09/20 0400 09/10/20 1108 09/10/20 1242  TROPONINIHS 28* 32* 388* 300*      Chemistry Recent Labs  Lab 09/11/20 0325 09/12/20 0038 09/13/20 0511 09/14/20 0254  NA 140 132* 138 137  K 3.5 3.9 4.4 4.2  CL 106 102 108 107  CO2 25 23 22 25   GLUCOSE 103* 164* 177* 178*  BUN 49* 42* 30* 21  CREATININE 1.62* 1.53* 1.27* 1.00  CALCIUM 8.8* 8.2* 8.7* 8.8*  PROT 6.2* 5.2* 5.4*  --   ALBUMIN 3.7 2.9* 3.0*  --   AST 109* 49* 34  --   ALT 48* 38 35  --   ALKPHOS 50 38 46  --   BILITOT 1.6* 2.0* 0.9  --   GFRNONAA 42* 45* 56* >60  ANIONGAP 9 7 8 5      Hematology Recent Labs  Lab 09/12/20 0038 09/13/20 0511 09/14/20 0254  WBC 13.6* 9.0 8.9  RBC 5.28 5.14 5.16  HGB 15.1 14.6 14.7  HCT 48.9 47.0 47.4  MCV 92.6 91.4 91.9  MCH 28.6 28.4 28.5  MCHC 30.9 31.1 31.0  RDW 14.3 14.2 14.4  PLT 152 111* 125*    BNP Recent Labs  Lab 09/10/20 1108  BNP 95.6     DDimer  Recent Labs  Lab 09/09/20 0830  DDIMER 1.87*     Radiology    No results found.  Cardiac Studies    Echo 09/09/2020  1. Left ventricular ejection fraction, by estimation, is 55 to 60%. The  left ventricle has normal function. The left ventricle has no regional  wall motion abnormalities. There is mild left ventricular hypertrophy.  Left ventricular diastolic parameters  are consistent with Grade I diastolic dysfunction (impaired relaxation).   2. Right ventricular systolic function is normal. The right ventricular  size is normal. Tricuspid regurgitation signal is inadequate for assessing  PA pressure.   3. Left atrial size was mildly dilated.   4. The mitral valve is grossly normal. Trivial  mitral valve  regurgitation.   5. The aortic valve is tricuspid. There is mild calcification of the  aortic valve. Aortic valve regurgitation is trivial. Mild aortic valve  sclerosis is present, with no evidence of aortic valve stenosis.    Patient Profile     85 y.o. male with PMH of OSA, parkinson's disease, CAD with stents in 2013, CKD, and IDDM who presented with anxiety and diaphoresis after stopping Xanax. He was noted to be in sinus tach with PVCs on arrival. Echo showed normal EF. He went into atrial flutter during this admission.  Assessment & Plan     1. Atrial flutter with RVR--Now in              - Echo shows normal EF             - continue amiodarone and metoprolol.             - still lethargic today, alert but not oriented X 2 at least. Now on Eliquis 5mg  BID today. Eventually, medication will need to be managed by SNF. Patient cannot manage his own medications.   2. Elevated troponin: suspect demand ischemia in the setting of acute illness and tachycardia along with mild rhabdomyolysis.   3. Acute on chronic renal insufficiency: renal function back to baseline, Cr 1.00 this morning.   4. Confusion: CT of head, no acute abnormality. acute metabolic encephalopathy per Neuro and IM.  5.  HTN 187/81 today on metoprolol XL 50, at home on valsartan 320 mg will defer to MD to resume, Cr has improved.  6.  Mild thrombocytopenia plts 125k  7. UTI, not present on admit, on ABx  8.  Hx CAD with stents placed 2013, ( 2 Xience stents in RCA and one in OM) hx of statin intolerance.         For questions or updates, please contact [place] Please consult [email or web address] for contact info under        Signed, Cecilie Kicks, NP  09/14/2020, 7:56 AM    Patient examined chart reviewed Exam with lethargy but MS has improved over the week Rhythm stable NSR Continue eliquis and beta blocker Add back ARB for HTN.  Follow PLTls to make sure stays safe to continue  anticoagulation. ? When to transition to oral antibiotics as MS improves  Jenkins Rouge MD [facility]

## 2020-09-14 NOTE — Progress Notes (Signed)
PT Cancellation Note  Patient Details Name: ATWELL MCDANEL MRN: [id number] DOB: 31-Dec-1935   Cancelled Treatment:    Reason Eval/Treat Not Completed: Medical issues which prohibited therapy. BP too elevated for therapy.   Vitals:    09/14/20 1200 09/14/20 1300 09/14/20 1312  BP: (!) 190/73 (!) 205/85 (!) 221/97  Pulse: 80 87 83  Resp: (!) 36 (!) 37   Temp: (!) 97.4 F (36.3 C)    SpO2: 97% 96% 96%     Gwynneth Albright PT, DPT Acute Rehabilitation Services Office [phone] Pager [phone]

## 2020-09-14 NOTE — TOC Progression Note (Signed)
Transition of Care [facility]) - Progression Note    Patient Details  Name: Jason Hartman MRN: [id number] Date of Birth: 02/12/1936  Transition of [facility] CM/SW Contact  Leeroy Cha, RN Phone Number: 09/14/2020, 9:44 AM  Clinical Narrative:    85 y.o. male with medical history significant for OSA, Parkinson's, restless leg syndrome, anxiety, chronic pain, chronic kidney disease, and insulin-dependent diabetes mellitus, who presented to the emergency department for evaluation of anxiety and diaphoresis.  Patient has had a lot of medication changes recently including discontinuation of alprazolam and initiation of tramadol Celexa BuSpar.  During the course of this hospital stay has become very encephalopathic.  Neurology was consulted.  He had to be moved to the stepdown unit.    plan: following for progression  Reason for Visit: Acute metabolic encephalopathy   Consultants: Neurology, pulmonology, cardiology   Procedures:   EEG did not show any epileptiform or seizure activity   Antibiotics: Anti-infectives (From admission, onward)       Expected Discharge Plan: Home/Self Care Barriers to Discharge: Continued Medical Work up  Expected Discharge Plan and Services Expected Discharge Plan: Home/Self Care   Discharge Planning Services: CM Consult   Living arrangements for the past 2 months: Single Family Home                                       Social Determinants of Health (SDOH) Interventions    Readmission Risk Interventions No flowsheet data found.

## 2020-09-14 NOTE — Discharge Instructions (Signed)
Information on my medicine - ELIQUIS (apixaban)  This medication education was reviewed with me or my healthcare representative as part of my discharge preparation.  TWhy was Eliquis prescribed for you? Eliquis was prescribed for you to reduce the risk of a blood clot forming that can cause a stroke if you have a medical condition called atrial fibrillation (a type of irregular heartbeat).  What do You need to know about Eliquis ? Take your Eliquis TWICE DAILY - one tablet in the morning and one tablet in the evening with or without food. If you have difficulty swallowing the tablet whole please discuss with your pharmacist how to take the medication safely.  Take Eliquis exactly as prescribed by your doctor and DO NOT stop taking Eliquis without talking to the doctor who prescribed the medication.  Stopping may increase your risk of developing a stroke.  Refill your prescription before you run out.  After discharge, you should have regular check-up appointments with your healthcare provider that is prescribing your Eliquis.  In the future your dose may need to be changed if your kidney function or weight changes by a significant amount or as you get older.  What do you do if you miss a dose? If you miss a dose, take it as soon as you remember on the same day and resume taking twice daily.  Do not take more than one dose of ELIQUIS at the same time to make up a missed dose.  Important Safety Information A possible side effect of Eliquis is bleeding. You should call your healthcare provider right away if you experience any of the following: Bleeding from an injury or your nose that does not stop. Unusual colored urine (red or dark brown) or unusual colored stools (red or black). Unusual bruising for unknown reasons. A serious fall or if you hit your head (even if there is no bleeding).  Some medicines may interact with Eliquis and might increase your risk of bleeding or clotting while  on Eliquis. To help avoid this, consult your healthcare provider or pharmacist prior to using any new prescription or non-prescription medications, including herbals, vitamins, non-steroidal anti-inflammatory drugs (NSAIDs) and supplements.  This website has more information on Eliquis (apixaban): http://www.eliquis.com/eliquis/home  

## 2020-09-14 NOTE — Progress Notes (Addendum)
[facility] PROGRESS NOTE   Jason Hartman HFW:[id number] DOB: Nov 01, 1935 DOA: 09/08/2020  PCP: [name], MD  Brief History/Interval Summary: 85 y.o. male with medical history significant for OSA, Parkinson's, restless leg syndrome, anxiety, chronic pain, chronic kidney disease, and insulin-dependent diabetes mellitus, who presented to the emergency department for evaluation of anxiety and diaphoresis.  Patient has had a lot of medication changes recently including discontinuation of alprazolam and initiation of tramadol Celexa BuSpar.  During the course of this hospital stay has become very encephalopathic.  Neurology was consulted.  He had to be moved to the stepdown unit.    Reason for Visit: Acute metabolic encephalopathy  Consultants: Neurology, pulmonology, cardiology  Procedures:   EEG did not show any epileptiform or seizure activity  Antibiotics: Anti-infectives (From admission, onward)    Start     Dose/Rate Route Frequency Ordered Stop   09/13/20 0000  cefTRIAXone (ROCEPHIN) 1 g in sodium chloride 0.9 % 100 mL IVPB        1 g 200 mL/hr over 30 Minutes Intravenous Every 24 hours 09/12/20 2339     09/09/20 0115  aztreonam (AZACTAM) 1 g in sodium chloride 0.9 % 100 mL IVPB        1 g 200 mL/hr over 30 Minutes Intravenous  Once 09/09/20 0106 09/09/20 0307       Subjective/Interval History: Patient remains in mittens.  Discussed with nursing staff.  No significant agitation noted overnight.  Remains confused.  History is limited.     Assessment/Plan:  Acute metabolic encephalopathy Etiology is unclear.  Differential diagnosis has been broad including polypharmacy, withdrawal, serotonin syndrome which has been less likely.  UTI could also be contributing. LP was considered however it appears that his wife did not want him to undergo this test.  Since meningitis seems to be less likely it is reasonable not to pursue the LP at this time.   CT head shows  chronic changes without any acute findings. EEG did not show any epileptiform activity. TSH 1.09 with free T4 of 1.3. Cortisol level 35.5.  O27 was normal.  Folic acid ammonia levels were normal. UA was noted to be abnormal.  Patient was complaining of dysuria and had suprapubic tenderness.  Started on ceftriaxone. Patient was requiring Precedex infusion which has been discontinued.   Remains confused.  Agitation seems to be better than before.  Neurology has signed off.  Will consult psychiatry to assist with management.  Noted to be on around-the-clock Valium.  We will decrease the frequency and change to oral.    Urinary tract infection UA was noted to be abnormal.  Urine culture is pending.  Patient is on ceftriaxone.  Continues to have significant suprapubic tenderness.  No recent abdominal imaging studies noted.  He did have a right upper quadrant ultrasound which concerning findings in the hepatobiliary system.  We will first do a bladder scan and if there is no significant retention then we may need to proceed with CT scan of the abdomen pelvis without contrast.   Essential hypertension/transient hypotension Was transiently on Levophed.  Now blood pressure in the hypertensive range.  Noted to be on metoprolol.  We will go up on the dose of the metoprolol.  Mild rhabdomyolysis CK level improved from 2578 to 493.    Atrial flutter, new diagnosis Echocardiogram showed normal EF.  Cardiology following.  Patient was on heparin infusion which has been transitioned to apixaban.  Patient remains on amiodarone and metoprolol.  Staph  hominis bacteremia Thought to be contaminant.  Repeat cultures were negative.  Obstructive sleep apnea CPAP nightly.  Elevated D-dimer Lower extremity Doppler studies negative.  VQ scan low probability.  History of parkinsonism's Noted to be on Rotigotine patch.  History of anxiety Recently stopped Xanax and benzodiazepine withdrawal was considered.   Apparently has been taking gabapentin which is currently on hold. Currently on Valium intravenously.  We will change to oral and decrease the frequency.  History of restless leg syndrome Gabapentin is on hold.  Acute kidney injury on chronic kidney disease stage IIIa Electrolytes are stable.  Monitor urine output.  Insulin-dependent diabetes mellitus HbA1c was 6.5 in May.  Holding basal insulin.  Currently on D5 infusion.  Monitor CBGs.  Obesity Estimated body mass index is 31.9 kg/m as calculated from the following:   Height as of this encounter: 5\' 4"  (1.626 m).   Weight as of this encounter: 84.3 kg.    DVT Prophylaxis: Apixaban Code Status: Full code Family Communication: No family at bedside Disposition Plan: May need to go to rehab.  PT and OT evaluation.  Status is: Inpatient  Remains inpatient appropriate because:Altered mental status and IV treatments appropriate due to intensity of illness or inability to take PO   Dispo: The patient is from: Home              Anticipated d/c is to: SNF              Patient currently is not medically stable to d/c.   Difficult to place patient No       Medications:  Scheduled:  amiodarone  200 mg Oral Daily   apixaban  5 mg Oral BID   diazepam  5 mg Intravenous Q4H   insulin aspart  0-9 Units Subcutaneous Q4H   mouth rinse  15 mL Mouth Rinse BID   metoprolol succinate  50 mg Oral Daily   mupirocin ointment  1 application Nasal BID   phenazopyridine  200 mg Oral TID WC   Rotigotine  1 patch Transdermal QPM   thiamine  100 mg Oral Daily   Continuous:  sodium chloride     sodium chloride     cefTRIAXone (ROCEPHIN)  IV Stopped (09/14/20 0028)   dextrose 5% lactated ringers with KCl 20 mEq/L 75 mL/hr at 09/14/20 0600   PRN:[CANCELED] Place/Maintain arterial line **AND** sodium chloride, acetaminophen **OR** acetaminophen, diazepam, diphenhydrAMINE-zinc acetate, hydrALAZINE, HYDROmorphone (DILAUDID) injection, naLOXone  (NARCAN)  injection, ofloxacin, ondansetron **OR** ondansetron (ZOFRAN) IV, senna-docusate, technetium albumin aggregated   Objective:  Vital Signs  Vitals:   09/14/20 0500 09/14/20 0600 09/14/20 0700 09/14/20 0800  BP: (!) 164/70 (!) 181/87 (!) 187/81 (!) 145/71  Pulse: 75 79 77 82  Resp: (!) 23 (!) 30 (!) 31 (!) 29  Temp:    98.6 F (37 C)  TempSrc:    Oral  SpO2: 97% 97% 97% 95%  Weight:      Height:        Intake/Output Summary (Last 24 hours) at 09/14/2020 0858 Last data filed at 09/14/2020 0630 Gross per 24 hour  Intake 2073.48 ml  Output 1250 ml  Net 823.48 ml    Filed Weights   09/12/20 0500 09/13/20 0412 09/14/20 0443  Weight: 85.3 kg 86 kg 84.3 kg    General appearance: Remains distracted.  In no distress. Resp: Clear to auscultation bilaterally.  Normal effort Cardio: S1-S2 is normal regular.  No S3-S4.  No rubs murmurs or bruit GI: Abdomen  is soft.  Remains tender in the suprapubic area with some guarding.  No rebound or rigidity.  Bowel sounds normal.   Extremities: No edema.  Moving all of his extremities Neurologic: Remains disoriented.  No focal neurological deficits.     Lab Results:  Data Reviewed: I have personally reviewed following labs and imaging studies  CBC: Recent Labs  Lab 09/08/20 2308 09/08/20 2339 09/10/20 0926 09/11/20 0325 09/12/20 0038 09/13/20 0511 09/14/20 0254  WBC 15.7*  --  20.0* 16.8* 13.6* 9.0 8.9  NEUTROABS 14.0*  --   --  13.9* 11.5*  --   --   HGB 19.0*   < > 17.6* 17.4* 15.1 14.6 14.7  HCT 58.5*   < > 54.7* 54.4* 48.9 47.0 47.4  MCV 87.2  --  88.8 89.3 92.6 91.4 91.9  PLT 200  --  210 167 152 111* 125*   < > = values in this interval not displayed.     Basic Metabolic Panel: Recent Labs  Lab 09/10/20 1629 09/11/20 0325 09/12/20 0038 09/13/20 0511 09/14/20 0254  NA 141 140 132* 138 137  K 3.6 3.5 3.9 4.4 4.2  CL 108 106 102 108 107  CO2 23 25 23 22 25   GLUCOSE 72 103* 164* 177* 178*  BUN 48* 49* 42*  30* 21  CREATININE 1.98* 1.62* 1.53* 1.27* 1.00  CALCIUM 8.9 8.8* 8.2* 8.7* 8.8*  MG  --  2.1 1.9  --   --   PHOS  --  3.9 3.0  --   --      GFR: Estimated Creatinine Clearance: 55.7 mL/min (by C-G formula based on SCr of 1 mg/dL).  Liver Function Tests: Recent Labs  Lab 09/08/20 2308 09/10/20 0926 09/11/20 0325 09/12/20 0038 09/13/20 0511  AST 27 64* 109* 49* 34  ALT 32 37 48* 38 35  ALKPHOS 65 46 50 38 46  BILITOT 2.7* 1.8* 1.6* 2.0* 0.9  PROT 7.6 5.8* 6.2* 5.2* 5.4*  ALBUMIN 4.3 3.4* 3.7 2.9* 3.0*      Recent Labs  Lab 09/10/20 1108  AMMONIA 23     Coagulation Profile: Recent Labs  Lab 09/10/20 1108  INR 1.2     Cardiac Enzymes: Recent Labs  Lab 09/09/20 0635 09/10/20 1108 09/11/20 0910 09/12/20 0038  CKTOTAL 263 2,578* 1,227* 493*      CBG: Recent Labs  Lab 09/13/20 1549 09/13/20 1927 09/13/20 2327 09/14/20 0330 09/14/20 0747  GLUCAP 184* 162* 184* 189* 157*       Recent Results (from the past 240 hour(s))  Urine culture     Status: Abnormal   Collection Time: 09/08/20 11:35 PM   Specimen: Urine, Clean Catch  Result Value Ref Range Status   Specimen Description   Final    URINE, CLEAN CATCH Performed at [facility], [place] [address] [place], [place] [address]    Special Requests   Final    NONE Performed at [facility], [place] [address] [place], [place] [address]    Culture (A)  Final    <10,000 COLONIES/mL INSIGNIFICANT GROWTH Performed at [place] [address] [place], [place] [address]    Report Status 09/10/2020 FINAL  Final  Resp Panel by RT-PCR (Flu A&B, Covid) Nasopharyngeal Swab     Status: None   Collection Time: 09/08/20 11:41 PM   Specimen: Nasopharyngeal Swab; Nasopharyngeal(NP) swabs in vial transport medium  Result Value Ref Range Status   SARS Coronavirus 2 by  RT PCR NEGATIVE NEGATIVE Final    Comment: (NOTE) SARS-CoV-2 target nucleic acids are NOT  DETECTED.  The SARS-CoV-2 RNA is generally detectable in upper respiratory specimens during the acute phase of infection. The lowest concentration of SARS-CoV-2 viral copies this assay can detect is 138 copies/mL. A negative result does not preclude SARS-Cov-2 infection and should not be used as the sole basis for treatment or other patient management decisions. A negative result may occur with  improper specimen collection/handling, submission of specimen other than nasopharyngeal swab, presence of viral mutation(s) within the areas targeted by this assay, and inadequate number of viral copies(<138 copies/mL). A negative result must be combined with clinical observations, patient history, and epidemiological information. The expected result is Negative.  Fact Sheet for Patients:  EntrepreneurPulse.com.au  Fact Sheet for Healthcare Providers:  IncredibleEmployment.be  This test is no t yet approved or cleared by the [place] FDA and  has been authorized for detection and/or diagnosis of SARS-CoV-2 by FDA under an Emergency Use Authorization (EUA). This EUA will remain  in effect (meaning this test can be used) for the duration of the COVID-19 declaration under Section 564(b)(1) of the Act, 21 U.S.C.section 360bbb-3(b)(1), unless the authorization is terminated  or revoked sooner.       Influenza A by PCR NEGATIVE NEGATIVE Final   Influenza B by PCR NEGATIVE NEGATIVE Final    Comment: (NOTE) The Xpert Xpress SARS-CoV-2/FLU/RSV plus assay is intended as an aid in the diagnosis of influenza from Nasopharyngeal swab specimens and should not be used as a sole basis for treatment. Nasal washings and aspirates are unacceptable for Xpert Xpress SARS-CoV-2/FLU/RSV testing.  Fact Sheet for Patients: EntrepreneurPulse.com.au  Fact Sheet for Healthcare Providers: IncredibleEmployment.be  This test is not yet  approved or cleared by the [place] FDA and has been authorized for detection and/or diagnosis of SARS-CoV-2 by FDA under an Emergency Use Authorization (EUA). This EUA will remain in effect (meaning this test can be used) for the duration of the COVID-19 declaration under Section 564(b)(1) of the Act, 21 U.S.C. section 360bbb-3(b)(1), unless the authorization is terminated or revoked.  Performed at [facility], [place] [address] [place], [place] [address]   Blood culture (routine x 2)     Status: Abnormal   Collection Time: 09/09/20  1:03 AM   Specimen: BLOOD  Result Value Ref Range Status   Specimen Description   Final    BLOOD RIGHT WRIST Performed at [place] [address] [place], [place] [address]    Special Requests   Final    BOTTLES DRAWN AEROBIC AND ANAEROBIC Blood Culture adequate volume Performed at [place] [address] [place], [place] [address]    Culture  Setup Time   Final    GRAM POSITIVE COCCI IN CLUSTERS IN BOTH AEROBIC AND ANAEROBIC BOTTLES CRITICAL RESULT CALLED TO, READ BACK BY AND VERIFIED WITH: PHARMD MICHELLE LILLISTAN 09/09/2020 AT 2220 A.HUGHES    Culture (A)  Final    STAPHYLOCOCCUS HOMINIS UNABLE TO ISOLATE S.EPIDERMIDIS THE SIGNIFICANCE OF ISOLATING THIS ORGANISM FROM A SINGLE SET OF BLOOD CULTURES WHEN MULTIPLE SETS ARE DRAWN IS UNCERTAIN. PLEASE NOTIFY THE MICROBIOLOGY DEPARTMENT WITHIN ONE WEEK IF SPECIATION AND SENSITIVITIES ARE REQUIRED. Performed at [facility], [place] [address], [place], [place] [address]    Report Status 09/13/2020 FINAL  Final  Blood Culture ID Panel (Reflexed)     Status: Abnormal   Collection Time: 09/09/20  1:03 AM  Result Value Ref Range Status   Enterococcus faecalis NOT DETECTED NOT DETECTED Final   Enterococcus Faecium NOT DETECTED NOT DETECTED Final   Listeria monocytogenes NOT DETECTED NOT DETECTED Final   Staphylococcus species  DETECTED (A) NOT DETECTED Final    Comment: CRITICAL RESULT CALLED TO, READ BACK BY AND VERIFIED WITH: PHARMD MICHELLE LILLISTAN 09/09/2020 AT 2220 A.HUGHES    Staphylococcus aureus (BCID) NOT DETECTED NOT DETECTED Final   Staphylococcus epidermidis DETECTED (A) NOT DETECTED Final    Comment: Methicillin (oxacillin) resistant coagulase negative staphylococcus. Possible blood culture contaminant (unless isolated from more than one blood culture draw or clinical case suggests pathogenicity). No antibiotic treatment is indicated for blood  culture contaminants. CRITICAL RESULT CALLED TO, READ BACK BY AND VERIFIED WITH: PHARMD MICHELLE LILLISTAN 09/09/2020 AT 2220 A.HUGHES    Staphylococcus lugdunensis NOT DETECTED NOT DETECTED Final   Streptococcus species NOT DETECTED NOT DETECTED Final   Streptococcus agalactiae NOT DETECTED NOT DETECTED Final   Streptococcus pneumoniae NOT DETECTED NOT DETECTED Final   Streptococcus pyogenes NOT DETECTED NOT DETECTED Final   A.calcoaceticus-baumannii NOT DETECTED NOT DETECTED Final   Bacteroides fragilis NOT DETECTED NOT DETECTED Final   Enterobacterales NOT DETECTED NOT DETECTED Final   Enterobacter cloacae complex NOT DETECTED NOT DETECTED Final   Escherichia coli NOT DETECTED NOT DETECTED Final   Klebsiella aerogenes NOT DETECTED NOT DETECTED Final   Klebsiella oxytoca NOT DETECTED NOT DETECTED Final   Klebsiella pneumoniae NOT DETECTED NOT DETECTED Final   Proteus species NOT DETECTED NOT DETECTED Final   Salmonella species NOT DETECTED NOT DETECTED Final   Serratia marcescens NOT DETECTED NOT DETECTED Final   Haemophilus influenzae NOT DETECTED NOT DETECTED Final   Neisseria meningitidis NOT DETECTED NOT DETECTED Final   Pseudomonas aeruginosa NOT DETECTED NOT DETECTED Final   Stenotrophomonas maltophilia NOT DETECTED NOT DETECTED Final   Candida albicans NOT DETECTED NOT DETECTED Final   Candida auris NOT DETECTED NOT DETECTED Final   Candida  glabrata NOT DETECTED NOT DETECTED Final   Candida krusei NOT DETECTED NOT DETECTED Final   Candida parapsilosis NOT DETECTED NOT DETECTED Final   Candida tropicalis NOT DETECTED NOT DETECTED Final   Cryptococcus neoformans/gattii NOT DETECTED NOT DETECTED Final   Methicillin resistance mecA/C DETECTED (A) NOT DETECTED Final    Comment: CRITICAL RESULT CALLED TO, READ BACK BY AND VERIFIED WITH: PHARMD MICHELLE LILLISTAN 09/09/2020 AT 2220 A.HUGHES Performed at [facility], [place] [address] [place], [place] [address]   Blood culture (routine x 2)     Status: None   Collection Time: 09/09/20  1:08 AM   Specimen: BLOOD  Result Value Ref Range Status   Specimen Description   Final    BLOOD LEFT WRIST Performed at [place] [address] [place], [place] [address]    Special Requests   Final    BOTTLES DRAWN AEROBIC AND ANAEROBIC Blood Culture adequate volume Performed at [place] [address], [place], [place] [address]    Culture   Final    NO GROWTH 5 DAYS Performed at [facility], [place] [address] [place],  [address]    Report Status 09/14/2020 FINAL  Final  MRSA PCR Screening     Status: Abnormal   Collection Time: 09/10/20  5:19 AM   Specimen: Nasal Mucosa; Nasopharyngeal  Result Value Ref Range Status   MRSA by PCR POSITIVE (A) NEGATIVE Final    Comment:  The GeneXpert MRSA Assay (FDA approved for NASAL specimens only), is one component of a comprehensive MRSA colonization surveillance program. It is not intended to diagnose MRSA infection nor to guide or monitor treatment for MRSA infections. RESULT CALLED TO, READ BACK BY AND VERIFIED WITH: HEAVNER,A. RN AT 1150 09/10/20 MULLINS,T Performed at [facility], [place] [address] [place], [place] [address]   Culture, blood (routine x 2)     Status: None (Preliminary result)   Collection Time: 09/10/20 11:03 AM   Specimen: BLOOD   Result Value Ref Range Status   Specimen Description   Final    BLOOD LEFT FOREARM Performed at [place] [address], [place], [place] [address]    Special Requests   Final    BOTTLES DRAWN AEROBIC ONLY Blood Culture results may not be optimal due to an inadequate volume of blood received in culture bottles Performed at [place] [address] [place], [place] [address]    Culture   Final    NO GROWTH 4 DAYS Performed at [facility], [place] [address], [place], [place] [address]    Report Status PENDING  Incomplete  Culture, blood (routine x 2)     Status: None (Preliminary result)   Collection Time: 09/10/20 11:03 AM   Specimen: BLOOD  Result Value Ref Range Status   Specimen Description   Final    BLOOD RIGHT ANTECUBITAL Performed at [place] [address], [place], [place] [address]    Special Requests   Final    BOTTLES DRAWN AEROBIC AND ANAEROBIC Blood Culture adequate volume Performed at [place] [address], [place], [place] [address]    Culture   Final    NO GROWTH 4 DAYS Performed at [facility], [place] [address], [place], [place] [address]    Report Status PENDING  Incomplete  Culture, Urine     Status: None (Preliminary result)   Collection Time: 09/12/20 11:39 PM   Specimen: Urine, Clean Catch  Result Value Ref Range Status   Specimen Description   Final    URINE, CLEAN CATCH Performed at [facility], [place] [address], [place], [place] [address]    Special Requests   Final    NONE Performed at [facility], [place] [address] [place], [place] [address]    Culture   Final    CULTURE REINCUBATED FOR BETTER GROWTH Performed at [facility], [place] [address] [place], [place] [address]    Report Status PENDING  Incomplete      Radiology Studies: No results found.      LOS: 4 days   Ajahni Nay  [employer] on [email or web address]  09/14/2020, 8:58 AM

## 2020-09-14 NOTE — Progress Notes (Signed)
In and Out cath attempted by 2 RN s, unsuccessful, catheter meeting resistance

## 2020-09-15 ENCOUNTER — Inpatient Hospital Stay (HOSPITAL_COMMUNITY): Payer: Medicare Other

## 2020-09-15 DIAGNOSIS — G9341 Metabolic encephalopathy: Secondary | ICD-10-CM

## 2020-09-15 DIAGNOSIS — Z794 Long term (current) use of insulin: Secondary | ICD-10-CM

## 2020-09-15 DIAGNOSIS — N1831 Chronic kidney disease, stage 3a: Secondary | ICD-10-CM

## 2020-09-15 DIAGNOSIS — E1122 Type 2 diabetes mellitus with diabetic chronic kidney disease: Secondary | ICD-10-CM

## 2020-09-15 DIAGNOSIS — I10 Essential (primary) hypertension: Secondary | ICD-10-CM

## 2020-09-15 DIAGNOSIS — N390 Urinary tract infection, site not specified: Secondary | ICD-10-CM

## 2020-09-15 LAB — BLOOD GAS, VENOUS
Acid-Base Excess: 1.9 mmol/L (ref 0.0–2.0)
Bicarbonate: 24.9 mmol/L (ref 20.0–28.0)
O2 Saturation: 89.6 %
Patient temperature: 98.6
pCO2, Ven: 35.9 mmHg — ABNORMAL LOW (ref 44.0–60.0)
pH, Ven: 7.455 — ABNORMAL HIGH (ref 7.250–7.430)
pO2, Ven: 60 mmHg — ABNORMAL HIGH (ref 32.0–45.0)

## 2020-09-15 LAB — CULTURE, BLOOD (ROUTINE X 2)
Culture: NO GROWTH
Culture: NO GROWTH
Special Requests: ADEQUATE

## 2020-09-15 LAB — BASIC METABOLIC PANEL
Anion gap: 12 (ref 5–15)
BUN: 21 mg/dL (ref 8–23)
CO2: 22 mmol/L (ref 22–32)
Calcium: 9.2 mg/dL (ref 8.9–10.3)
Chloride: 101 mmol/L (ref 98–111)
Creatinine, Ser: 1.05 mg/dL (ref 0.61–1.24)
GFR, Estimated: >60 mL/min (ref >60)
Glucose, Bld: 224 mg/dL — ABNORMAL HIGH (ref 70–99)
Potassium: 4 mmol/L (ref 3.5–5.1)
Sodium: 135 mmol/L (ref 135–145)

## 2020-09-15 LAB — CBC
HCT: 55.7 % — ABNORMAL HIGH (ref 39.0–52.0)
Hemoglobin: 17.8 g/dL — ABNORMAL HIGH (ref 13.0–17.0)
MCH: 28.6 pg (ref 26.0–34.0)
MCHC: 32 g/dL (ref 30.0–36.0)
MCV: 89.4 fL (ref 80.0–100.0)
Platelets: 159 10*3/uL (ref 150–400)
RBC: 6.23 MIL/uL — ABNORMAL HIGH (ref 4.22–5.81)
RDW: 15 % (ref 11.5–15.5)
WBC: 15.5 10*3/uL — ABNORMAL HIGH (ref 4.0–10.5)
nRBC: 0 % (ref 0.0–0.2)

## 2020-09-15 LAB — GLUCOSE, CAPILLARY
Glucose-Capillary: 234 mg/dL — ABNORMAL HIGH (ref 70–99)
Glucose-Capillary: 299 mg/dL — ABNORMAL HIGH (ref 70–99)
Glucose-Capillary: 280 mg/dL — ABNORMAL HIGH (ref 70–99)
Glucose-Capillary: 287 mg/dL — ABNORMAL HIGH (ref 70–99)
Glucose-Capillary: 242 mg/dL — ABNORMAL HIGH (ref 70–99)

## 2020-09-15 LAB — URINE CULTURE: Culture: >=100000 — AB

## 2020-09-15 IMAGING — DX DG CHEST 1V PORT
1 series · 1 of 1 positions shown · non-contrast
Comparison: [DATE]

CLINICAL DATA: Tachypnea

EXAM:
PORTABLE CHEST 1 VIEW

[chest ap]
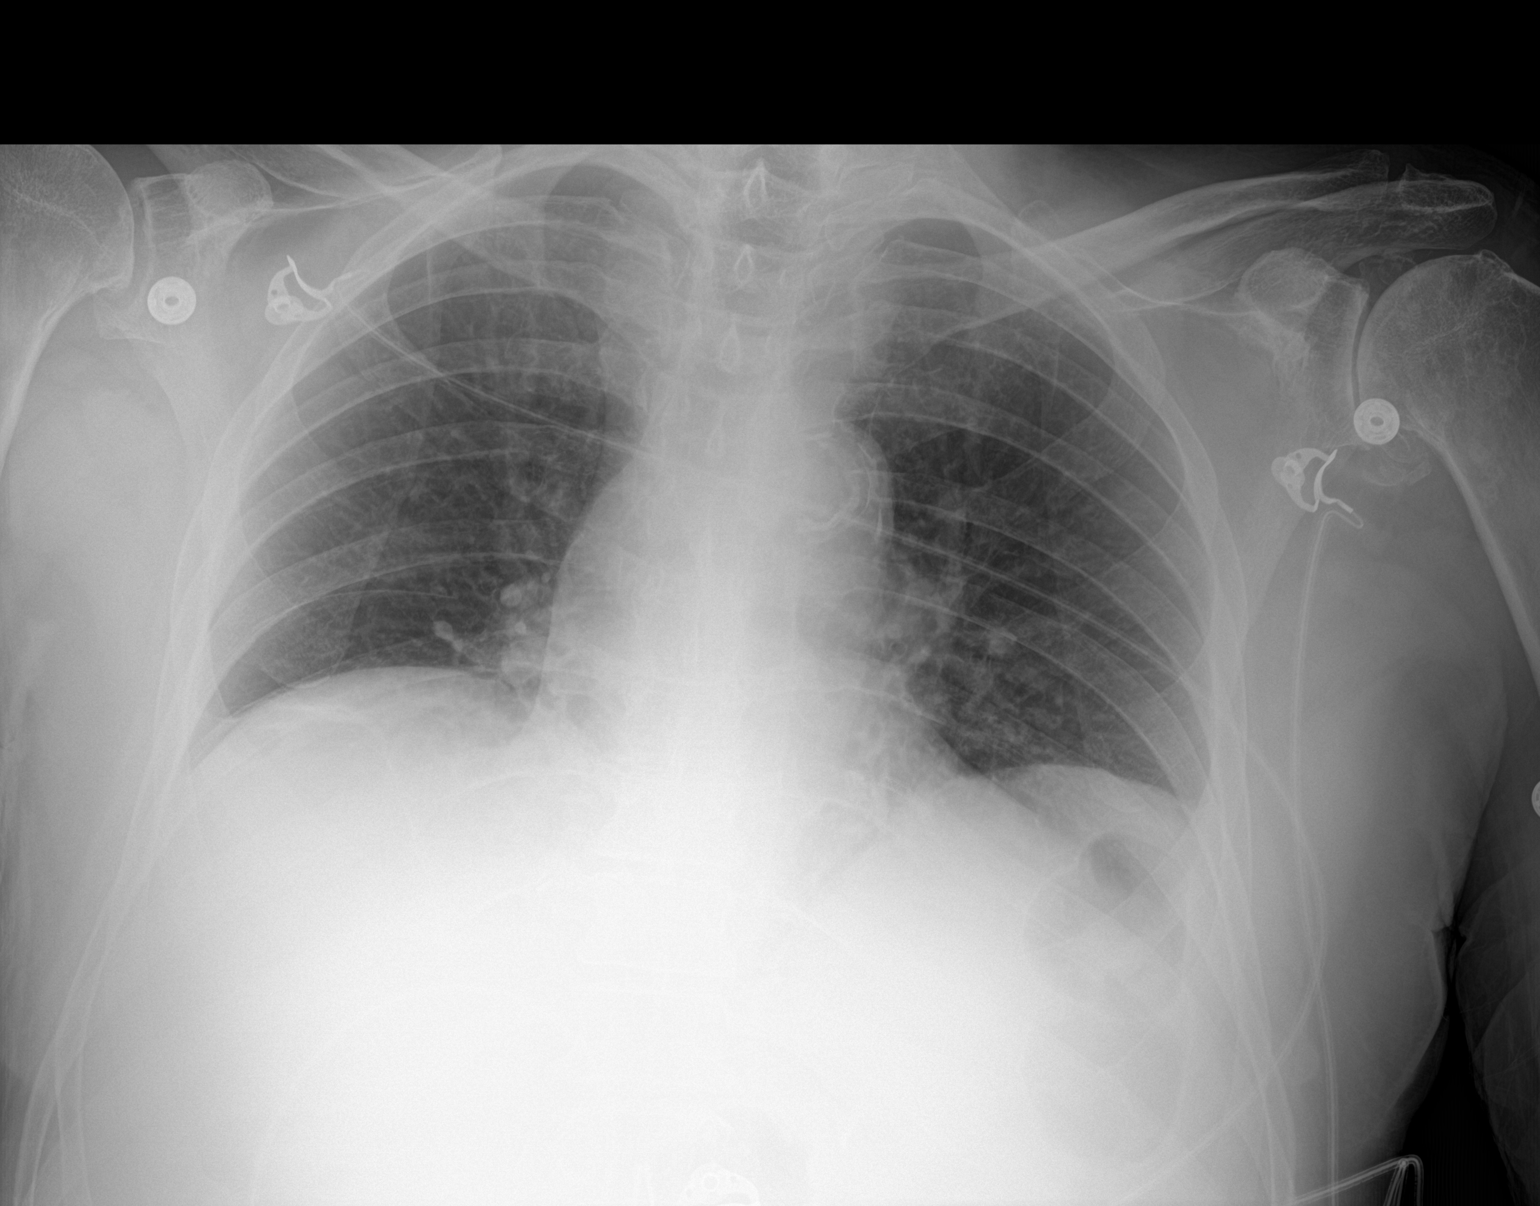

[1 of 1 positions shown; findings below may reference images not displayed]

FINDINGS: Low lung volumes, right base atelectasis. Heart is normal size.
Aortic atherosclerosis. No effusions or pneumothorax. No acute bony
abnormality.
IMPRESSION: Low lung volumes.  Right base atelectasis.

## 2020-09-15 MED ORDER — SODIUM CHLORIDE 0.9 % IV SOLN
3.0000 g | Freq: Three times a day (TID) | INTRAVENOUS | Status: DC
  Administered 2020-09-15 – 2020-09-18 (×9): 3 g via INTRAVENOUS
  Filled 2020-09-15: qty 3
  Filled 2020-09-15 (×6): qty 8
  Filled 2020-09-15: qty 3
  Filled 2020-09-15: qty 8
  Filled 2020-09-15: qty 3

## 2020-09-15 MED ORDER — INSULIN GLARGINE 100 UNIT/ML ~~LOC~~ SOLN
5.0000 [IU] | Freq: Every day | SUBCUTANEOUS | Status: DC
  Administered 2020-09-15 – 2020-09-25 (×11): 5 [IU] via SUBCUTANEOUS
  Filled 2020-09-15 (×12): qty 0.05

## 2020-09-15 NOTE — Progress Notes (Addendum)
Progress Note  Patient Name: Jason Hartman Date of Encounter: 09/15/2020  [facility] Cardiologist: None  [place] cards  Subjective   No chest pain but having catheter placed and pt with lower abd pain.   Inpatient Medications    Scheduled Meds:  amiodarone  200 mg Oral Daily   apixaban  5 mg Oral BID   diazepam  5 mg Oral QID   insulin aspart  0-9 Units Subcutaneous Q4H   irbesartan  300 mg Oral Daily   mouth rinse  15 mL Mouth Rinse BID   metoprolol succinate  100 mg Oral Daily   phenazopyridine  200 mg Oral TID WC   Rotigotine  1 patch Transdermal QPM   thiamine  100 mg Oral Daily   Continuous Infusions:  sodium chloride     sodium chloride     cefTRIAXone (ROCEPHIN)  IV Stopped (09/15/20 0033)   dextrose 5% lactated ringers with KCl 20 mEq/L 75 mL/hr at 09/15/20 0720   PRN Meds: [CANCELED] Place/Maintain arterial line **AND** sodium chloride, acetaminophen **OR** acetaminophen, diazepam, diphenhydrAMINE-zinc acetate, hydrALAZINE, HYDROmorphone (DILAUDID) injection, naLOXone (NARCAN)  injection, ofloxacin, ondansetron **OR** ondansetron (ZOFRAN) IV, senna-docusate, technetium albumin aggregated   Vital Signs    Vitals:   09/15/20 0300 09/15/20 0334 09/15/20 0500 09/15/20 0700  BP: (!) 184/74   (!) 230/93  Pulse: 99   (!) 112  Resp: 18   (!) 21  Temp:  98.7 F (37.1 C)    TempSrc:  Axillary    SpO2: 93%   93%  Weight:   81.9 kg   Height:        Intake/Output Summary (Last 24 hours) at 09/15/2020 0806 Last data filed at 09/15/2020 0234 Gross per 24 hour  Intake 1551.17 ml  Output 2425 ml  Net -873.83 ml   Last 3 Weights 09/15/2020 09/14/2020 09/13/2020  Weight (lbs) 180 lb 8.9 oz 185 lb 13.6 oz 189 lb 9.5 oz  Weight (kg) 81.9 kg 84.3 kg 86 kg      Telemetry    SR with PACs  - Personally Reviewed  ECG    No new - Personally Reviewed  Physical Exam   GEN: No acute distress.  Though pain with urinary cath.  Neck: No JVD Cardiac: RRR, no  murmurs, rubs, or gallops.  Respiratory: Clear to auscultation bilaterally. GI: Soft, nontender, non-distended  MS: No edema; No deformity. Neuro:  Nonfocal  Psych: Normal affect   Labs    High Sensitivity Troponin:   Recent Labs  Lab 09/08/20 2308 09/09/20 0400 09/10/20 1108 09/10/20 1242  TROPONINIHS 28* 32* 388* 300*      Chemistry Recent Labs  Lab 09/11/20 0325 09/12/20 0038 09/13/20 0511 09/14/20 0254 09/15/20 0300  NA 140 132* 138 137 135  K 3.5 3.9 4.4 4.2 4.0  CL 106 102 108 107 101  CO2 25 23 22 25 22   GLUCOSE 103* 164* 177* 178* 224*  BUN 49* 42* 30* 21 21  CREATININE 1.62* 1.53* 1.27* 1.00 1.05  CALCIUM 8.8* 8.2* 8.7* 8.8* 9.2  PROT 6.2* 5.2* 5.4*  --   --   ALBUMIN 3.7 2.9* 3.0*  --   --   AST 109* 49* 34  --   --   ALT 48* 38 35  --   --   ALKPHOS 50 38 46  --   --   BILITOT 1.6* 2.0* 0.9  --   --   GFRNONAA 42* 45* 56* >60 >60  ANIONGAP 9 7 8 5 12      Hematology Recent Labs  Lab 09/13/20 0511 09/14/20 0254 09/15/20 0300  WBC 9.0 8.9 15.5*  RBC 5.14 5.16 6.23*  HGB 14.6 14.7 17.8*  HCT 47.0 47.4 55.7*  MCV 91.4 91.9 89.4  MCH 28.4 28.5 28.6  MCHC 31.1 31.0 32.0  RDW 14.2 14.4 15.0  PLT 111* 125* 159    BNP Recent Labs  Lab 09/10/20 1108  BNP 95.6     DDimer  Recent Labs  Lab 09/09/20 0830  DDIMER 1.87*     Radiology    CT ABDOMEN PELVIS WO CONTRAST  Result Date: 09/14/2020 CLINICAL DATA:  Acute non localized abdominal pain. EXAM: CT ABDOMEN AND PELVIS WITHOUT CONTRAST TECHNIQUE: Multidetector CT imaging of the abdomen and pelvis was performed following the standard protocol without IV contrast. COMPARISON:  Ultrasound 5 days ago.  CT abdomen 02/19/2020. FINDINGS: Lower chest: Mild atelectasis at the lung bases which obscures the small nodules discussed on the previous exam. No pleural effusion. Hepatobiliary: No liver abnormality seen without contrast. No calcified gallstones. Pancreas: Normal Spleen: Normal Adrenals/Urinary  Tract: Adrenal glands are normal. Right kidney shows a few punctate nonobstructing calculi. Renal cysts are present as seen previously, the largest in the lateral midportion measuring 3.7 cm in diameter. There is hydroureteronephrosis with the ureter dilated all the way to the bladder. No obstructing lesion seen at the UVJ. Left kidney shows a few small cysts as seen previously. Fullness of the renal collecting system in ureter on this side as well all the way to the bladder, without obstructing lesion. The bladder is distended. No obstructing lesion evident within the prostate or proximal penile urethra. Stomach/Bowel: Stomach is normal. Small intestine is normal. Colon is normal. Vascular/Lymphatic: Aortic atherosclerosis. No aneurysm. IVC is normal. No retroperitoneal adenopathy. Reproductive: Negative Other: No free fluid or air. Musculoskeletal: Chronic lower lumbar degenerative changes. IMPRESSION: Distended bladder. This could be neurogenic or due to relative outlet obstruction. Backflow dilatation of the renal collecting systems and ureters without evidence of obstructing lesion. Chronic renal cysts. Two nonobstructing renal calculi on the right. Atelectasis at both lung bases. Cannot evaluate the small peripheral densities described in the left lower lobe on the study last November. Aortic atherosclerosis. Electronically Signed   By: Nelson Chimes M.D.   On: 09/14/2020 19:33    Cardiac Studies   Echo 09/09/2020  1. Left ventricular ejection fraction, by estimation, is 55 to 60%. The  left ventricle has normal function. The left ventricle has no regional  wall motion abnormalities. There is mild left ventricular hypertrophy.  Left ventricular diastolic parameters  are consistent with Grade I diastolic dysfunction (impaired relaxation).   2. Right ventricular systolic function is normal. The right ventricular  size is normal. Tricuspid regurgitation signal is inadequate for assessing  PA pressure.    3. Left atrial size was mildly dilated.   4. The mitral valve is grossly normal. Trivial mitral valve  regurgitation.   5. The aortic valve is tricuspid. There is mild calcification of the  aortic valve. Aortic valve regurgitation is trivial. Mild aortic valve  sclerosis is present, with no evidence of aortic valve stenosis.    Patient Profile     85 y.o. male with PMH of OSA, parkinson's disease, CAD with stents in 2013, CKD, and IDDM who presented with anxiety and diaphoresis after stopping Xanax. He was noted to be in sinus tach with PVCs on arrival. Echo showed normal EF. He went into  atrial flutter during this admission.    Assessment & Plan    1. Atrial flutter with RVR--Now in SR to ST with PACs             - Echo shows normal EF             - continue amiodarone and metoprolol.             - still somewhat lethargic today, but with pain awake. Now on Eliquis 5mg  BID today. Eventually, medication will need to be managed by SNF. Patient cannot manage his own medications.   2. Elevated troponin: suspect demand ischemia in the setting of acute illness and tachycardia along with mild rhabdomyolysis.   3. Acute on chronic renal insufficiency: renal function back to baseline, Cr 1.05 this morning.   4. Confusion: CT of head, no acute abnormality. acute metabolic encephalopathy per Neuro and IM.   5.  HTN 187/81 today on metoprolol XL 50, at home on valsartan 320 mg will defer to MD to resume, Cr has improved.   6.  Mild thrombocytopenia plts 125k now improved to 159k   7. UTI, not present on admit, on ABx  WBC 15.5, Hgb 17.8  urology to place cath -RNs unable to place coude catheter. --distended bladder on T of abd yesterday.   8.  Hx CAD with stents placed 2013, ( 2 Xience stents in RCA and one in OM) hx of statin intolerance.  9.  DM-2/anxiety/parkinson's/OSA followed by IM         For questions or updates, please contact [place] Please consult [email or web address] for  contact info under        Signed, Cecilie Kicks, NP  09/15/2020, 8:06 AM    Patient examined chart reviewed Telemetry with NSR continue amiodarone and eliquis cardiac status is stable  Jenkins Rouge MD Leonidas Romberg

## 2020-09-15 NOTE — Progress Notes (Signed)
Pt with poor responsiveness to painful stimuli. Received report that pt has been lethargic since foley was inserted by Urologist however prior had been confused and severely agitated for days. Vitals are stable apart from increased respirations. CBG is 242. PERRLA present and brisk. With painful stimuli pt does grimace. MD on call for triad notified and orders received. Will continue to monitor.

## 2020-09-15 NOTE — Consult Note (Signed)
Urology Consult  Referring physician: Dr. [place] Pink Reason for referral: urinary retention, difficult foley placement  Chief Complaint: Urinary retention  History of Present Illness: Jason Hartman is a 85yo with a history DMII admitted with anxiety and concern for sepsis. He then developed encephalopathy and is currently in the stepdown unit. He is currently confused and cannot give any urologic history. He has been urinating small volumes since hospitalization and was found to have 1L on CT. Multiple attempts were made by the nursing staff to place a foley which were unsuccessful. His urine culture was positive for enterococcus.   Past Medical History:  Diagnosis Date   Cellulitis and abscess of leg, except foot    Hypertension    Obstructive sleep apnea (adult) (pediatric)    Pain in joint, pelvic region and thigh    Pneumonia, organism unspecified(486)    Restless legs syndrome (RLS)    RLS (restless legs syndrome) 09/01/2012   Thoracic or lumbosacral neuritis or radiculitis, unspecified    Type II or unspecified type diabetes mellitus without mention of complication, not stated as uncontrolled    Unspecified disease of pericardium    Unspecified hereditary and idiopathic peripheral neuropathy    Past Surgical History:  Procedure Laterality Date   ANAL FISSURE REPAIR     pericarditis  2009    Medications: I have reviewed the patient's current medications. Allergies:  Allergies  Allergen Reactions   Oxycodone Anaphylaxis    Other reaction(s): Other (See Comments) Cannot recall specifics   Iodinated Diagnostic Agents Hives    alleric to renografin,isovue & omnipaque, hives, requires 13 hr prep//a.calhoun, Onset Date: [id number]      Iodine Hives and Rash    alleric to renografin,isovue & omnipaque, hives, requires 13 hr prep//a.calhoun, Onset Date: [id number] allergic to renografin,isovue & omnipaque, hives, requires 13 hr prep//a.calhoun, Onset Date: [id number]   Linezolid Hives and  Rash        Methadone Hcl Other (See Comments)    HALLUCINATIONS    Promethazine Other (See Comments)    Mental status change, DELIRIUM   Other reaction(s): erratic behavior Other reaction(s): Other (See Comments) Mental status change DELIRIUM (pulled out IV)   Morphine Sulfate Other (See Comments)    Pt not sure about allergy    Other Hives    Other reaction(s): erratic behanvior Other reaction(s): erratic behavior   Oxycodone Hcl Other (See Comments)    Pt not sure about allergy    Quetiapine Other (See Comments)    Pt not sure about allergy  Other reaction(s): Other (See Comments) Pt not sure about allergy  Pt not sure about allergy    Statins     Other reaction(s): muscle weakness Other reaction(s): muscle weakness   Zolpidem Other (See Comments)    Other reaction(s): erratic behanvior Other reaction(s): Delusions (intolerance) Altered mental status   Atorvastatin Itching and Other (See Comments)    Tired, weakness    Carbidopa-Levodopa Anxiety   Chlorhexidine Gluconate [Chlorhexidine] Hives and Rash   Clindamycin Rash   Colesevelam Other (See Comments)    tired    Doxazosin Rash   Lovastatin Other (See Comments)    Tired, nervousness    Rosuvastatin Other (See Comments)    Tired, weakness     Family History  Problem Relation Age of Onset   Diabetes Father    Social History:  reports that he quit smoking about 40 years ago. He has never used smokeless tobacco. He reports that he does not drink alcohol and  does not use drugs.  Review of Systems  Unable to perform ROS: Mental status change   Physical Exam:  Vital signs in last 24 hours: Temp:  [97.7 F (36.5 C)-98.9 F (37.2 C)] 98.9 F (37.2 C) (06/10 1600) Pulse Rate:  [84-112] 107 (06/10 1800) Resp:  [16-38] 38 (06/10 1800) BP: (110-230)/(48-126) 110/52 (06/10 1800) SpO2:  [85 %-96 %] 93 % (06/10 1800) Weight:  [81.9 kg] 81.9 kg (06/10 0500) Physical Exam Vitals reviewed.  Constitutional:       Appearance: Normal appearance.  HENT:     Head: Normocephalic and atraumatic.     Nose: Nose normal. No congestion.  Eyes:     General: No scleral icterus.       Right eye: No discharge.        Left eye: No discharge.  Cardiovascular:     Rate and Rhythm: Tachycardia present.  Pulmonary:     Effort: Pulmonary effort is normal. No respiratory distress.  Abdominal:     General: Abdomen is flat. There is no distension.  Genitourinary:    Penis: Normal.      Testes: Normal.  Musculoskeletal:        General: No swelling. Normal range of motion.     Cervical back: Normal range of motion and neck supple.  Skin:    General: Skin is warm and dry.  Neurological:     Mental Status: He is disoriented.    Laboratory Data:  Results for orders placed or performed during the hospital encounter of 09/08/20 (from the past 72 hour(s))  Heparin level (unfractionated)     Status: Abnormal   Collection Time: 09/12/20  8:39 PM  Result Value Ref Range   Heparin Unfractionated 0.29 (L) 0.30 - 0.70 IU/mL    Comment: (NOTE) The clinical reportable range upper limit is being lowered to >1.10 to align with the FDA approved guidance for the current laboratory assay.  If heparin results are below expected values, and patient dosage has  been confirmed, suggest follow up testing of antithrombin III levels. Performed at [facility], [place] [address] , [place] [address]   Urinalysis, Routine w reflex microscopic     Status: Abnormal   Collection Time: 09/12/20 10:53 PM  Result Value Ref Range   Color, Urine YELLOW YELLOW   APPearance HAZY (A) CLEAR   Specific Gravity, Urine 1.005 1.005 - 1.030   pH 6.0 5.0 - 8.0   Glucose, UA >=500 (A) NEGATIVE mg/dL   Hgb urine dipstick LARGE (A) NEGATIVE   Bilirubin Urine NEGATIVE NEGATIVE   Ketones, ur NEGATIVE NEGATIVE mg/dL   Protein, ur NEGATIVE NEGATIVE mg/dL   Nitrite NEGATIVE NEGATIVE   Leukocytes,Ua LARGE (A) NEGATIVE    RBC / HPF 0-5 0 - 5 RBC/hpf   WBC, UA >50 (H) 0 - 5 WBC/hpf   Bacteria, UA RARE (A) NONE SEEN   Squamous Epithelial / LPF 0-5 0 - 5   WBC Clumps PRESENT    Mucus PRESENT     Comment: Performed at [facility], [place] [address], [place], [place] [address]  Culture, Urine     Status: Abnormal   Collection Time: 09/12/20 11:39 PM   Specimen: Urine, Clean Catch  Result Value Ref Range   Specimen Description      URINE, CLEAN CATCH Performed at [facility], [place] [address], [place], [place] [address]    Special Requests      NONE Performed at [facility]  [facility], [place] [address] [place], [place] [address]    Culture (A)     >=100,000 COLONIES/mL ENTEROCOCCUS FAECALIS 10,000 COLONIES/mL ACINETOBACTER CALCOACETICUS/BAUMANNII COMPLEX    Report Status 09/15/2020 FINAL    Organism ID, Bacteria ENTEROCOCCUS FAECALIS (A)    Organism ID, Bacteria ACINETOBACTER CALCOACETICUS/BAUMANNII COMPLEX (A)       Susceptibility   Acinetobacter calcoaceticus/baumannii complex - MIC*    CEFTAZIDIME 4 SENSITIVE Sensitive     CIPROFLOXACIN <=0.25 SENSITIVE Sensitive     GENTAMICIN <=1 SENSITIVE Sensitive     IMIPENEM <=0.25 SENSITIVE Sensitive     PIP/TAZO 8 SENSITIVE Sensitive     TRIMETH/SULFA <=20 SENSITIVE Sensitive     AMPICILLIN/SULBACTAM <=2 SENSITIVE Sensitive     * 10,000 COLONIES/mL ACINETOBACTER CALCOACETICUS/BAUMANNII COMPLEX   Enterococcus faecalis - MIC*    AMPICILLIN <=2 SENSITIVE Sensitive     NITROFURANTOIN <=16 SENSITIVE Sensitive     VANCOMYCIN 1 SENSITIVE Sensitive     * >=100,000 COLONIES/mL ENTEROCOCCUS FAECALIS  Glucose, capillary     Status: Abnormal   Collection Time: 09/13/20 12:12 AM  Result Value Ref Range   Glucose-Capillary 144 (H) 70 - 99 mg/dL    Comment: Glucose reference range applies only to samples taken after fasting for at least 8 hours.   Comment 1 Notify RN    Comment 2 Document in Chart   Glucose, capillary      Status: Abnormal   Collection Time: 09/13/20  3:29 AM  Result Value Ref Range   Glucose-Capillary 157 (H) 70 - 99 mg/dL    Comment: Glucose reference range applies only to samples taken after fasting for at least 8 hours.   Comment 1 Notify RN    Comment 2 Document in Chart   CBC     Status: Abnormal   Collection Time: 09/13/20  5:11 AM  Result Value Ref Range   WBC 9.0 4.0 - 10.5 K/uL   RBC 5.14 4.22 - 5.81 MIL/uL   Hemoglobin 14.6 13.0 - 17.0 g/dL   HCT 47.0 39.0 - 52.0 %   MCV 91.4 80.0 - 100.0 fL   MCH 28.4 26.0 - 34.0 pg   MCHC 31.1 30.0 - 36.0 g/dL   RDW 14.2 11.5 - 15.5 %   Platelets 111 (L) 150 - 400 K/uL    Comment: SPECIMEN CHECKED FOR CLOTS Immature Platelet Fraction may be clinically indicated, consider ordering this additional test [id number]    nRBC 0.0 0.0 - 0.2 %    Comment: Performed at [facility], [place] [address] [place], [place] [address]  Comprehensive metabolic panel     Status: Abnormal   Collection Time: 09/13/20  5:11 AM  Result Value Ref Range   Sodium 138 135 - 145 mmol/L   Potassium 4.4 3.5 - 5.1 mmol/L   Chloride 108 98 - 111 mmol/L   CO2 22 22 - 32 mmol/L   Glucose, Bld 177 (H) 70 - 99 mg/dL    Comment: Glucose reference range applies only to samples taken after fasting for at least 8 hours.   BUN 30 (H) 8 - 23 mg/dL   Creatinine, Ser 1.27 (H) 0.61 - 1.24 mg/dL   Calcium 8.7 (L) 8.9 - 10.3 mg/dL   Total Protein 5.4 (L) 6.5 - 8.1 g/dL   Albumin 3.0 (L) 3.5 - 5.0 g/dL   AST 34 15 - 41 U/L   ALT 35 0 - 44 U/L   Alkaline Phosphatase 46 38 - 126 U/L   Total  Bilirubin 0.9 0.3 - 1.2 mg/dL   GFR, Estimated 56 (L) >60 mL/min    Comment: (NOTE) Calculated using the CKD-EPI Creatinine Equation (2021)    Anion gap 8 5 - 15    Comment: Performed at [facility], [place] [address], [place], [place] [address]  Heparin level (unfractionated)     Status: None   Collection Time: 09/13/20  5:11 AM  Result Value Ref  Range   Heparin Unfractionated 0.36 0.30 - 0.70 IU/mL    Comment: (NOTE) The clinical reportable range upper limit is being lowered to >1.10 to align with the FDA approved guidance for the current laboratory assay.  If heparin results are below expected values, and patient dosage has  been confirmed, suggest follow up testing of antithrombin III levels. Performed at [facility], [place] [address], [place], [place] [address]   Glucose, capillary     Status: Abnormal   Collection Time: 09/13/20  7:34 AM  Result Value Ref Range   Glucose-Capillary 181 (H) 70 - 99 mg/dL    Comment: Glucose reference range applies only to samples taken after fasting for at least 8 hours.   Comment 1 Notify RN    Comment 2 Document in Chart   Glucose, capillary     Status: Abnormal   Collection Time: 09/13/20 11:57 AM  Result Value Ref Range   Glucose-Capillary 204 (H) 70 - 99 mg/dL    Comment: Glucose reference range applies only to samples taken after fasting for at least 8 hours.   Comment 1 Notify RN    Comment 2 Document in Chart   Glucose, capillary     Status: Abnormal   Collection Time: 09/13/20  3:49 PM  Result Value Ref Range   Glucose-Capillary 184 (H) 70 - 99 mg/dL    Comment: Glucose reference range applies only to samples taken after fasting for at least 8 hours.   Comment 1 Notify RN    Comment 2 Document in Chart   Glucose, capillary     Status: Abnormal   Collection Time: 09/13/20  7:27 PM  Result Value Ref Range   Glucose-Capillary 162 (H) 70 - 99 mg/dL    Comment: Glucose reference range applies only to samples taken after fasting for at least 8 hours.  Glucose, capillary     Status: Abnormal   Collection Time: 09/13/20 11:27 PM  Result Value Ref Range   Glucose-Capillary 184 (H) 70 - 99 mg/dL    Comment: Glucose reference range applies only to samples taken after fasting for at least 8 hours.  CBC     Status: Abnormal   Collection Time: 09/14/20  2:54 AM   Result Value Ref Range   WBC 8.9 4.0 - 10.5 K/uL   RBC 5.16 4.22 - 5.81 MIL/uL   Hemoglobin 14.7 13.0 - 17.0 g/dL   HCT 47.4 39.0 - 52.0 %   MCV 91.9 80.0 - 100.0 fL   MCH 28.5 26.0 - 34.0 pg   MCHC 31.0 30.0 - 36.0 g/dL   RDW 14.4 11.5 - 15.5 %   Platelets 125 (L) 150 - 400 K/uL    Comment: SPECIMEN CHECKED FOR CLOTS CONSISTENT WITH PREVIOUS RESULT REPEATED TO VERIFY    nRBC 0.0 0.0 - 0.2 %    Comment: Performed at [facility], [place] [address], [place], [place] [address]  Basic metabolic panel     Status: Abnormal   Collection Time: 09/14/20  2:54 AM  Result Value  Ref Range   Sodium 137 135 - 145 mmol/L   Potassium 4.2 3.5 - 5.1 mmol/L   Chloride 107 98 - 111 mmol/L   CO2 25 22 - 32 mmol/L   Glucose, Bld 178 (H) 70 - 99 mg/dL    Comment: Glucose reference range applies only to samples taken after fasting for at least 8 hours.   BUN 21 8 - 23 mg/dL   Creatinine, Ser 1.00 0.61 - 1.24 mg/dL   Calcium 8.8 (L) 8.9 - 10.3 mg/dL   GFR, Estimated >60 >60 mL/min    Comment: (NOTE) Calculated using the CKD-EPI Creatinine Equation (2021)    Anion gap 5 5 - 15    Comment: Performed at [facility], [place] [address], [place], [place] [address]  Glucose, capillary     Status: Abnormal   Collection Time: 09/14/20  3:30 AM  Result Value Ref Range   Glucose-Capillary 189 (H) 70 - 99 mg/dL    Comment: Glucose reference range applies only to samples taken after fasting for at least 8 hours.  Glucose, capillary     Status: Abnormal   Collection Time: 09/14/20  7:47 AM  Result Value Ref Range   Glucose-Capillary 157 (H) 70 - 99 mg/dL    Comment: Glucose reference range applies only to samples taken after fasting for at least 8 hours.  Glucose, capillary     Status: Abnormal   Collection Time: 09/14/20 11:39 AM  Result Value Ref Range   Glucose-Capillary 223 (H) 70 - 99 mg/dL    Comment: Glucose reference range applies only to samples taken after  fasting for at least 8 hours.  Glucose, capillary     Status: Abnormal   Collection Time: 09/14/20  3:46 PM  Result Value Ref Range   Glucose-Capillary 255 (H) 70 - 99 mg/dL    Comment: Glucose reference range applies only to samples taken after fasting for at least 8 hours.  Glucose, capillary     Status: Abnormal   Collection Time: 09/14/20  7:29 PM  Result Value Ref Range   Glucose-Capillary 261 (H) 70 - 99 mg/dL    Comment: Glucose reference range applies only to samples taken after fasting for at least 8 hours.  Glucose, capillary     Status: Abnormal   Collection Time: 09/14/20 11:03 PM  Result Value Ref Range   Glucose-Capillary 243 (H) 70 - 99 mg/dL    Comment: Glucose reference range applies only to samples taken after fasting for at least 8 hours.  CBC     Status: Abnormal   Collection Time: 09/15/20  3:00 AM  Result Value Ref Range   WBC 15.5 (H) 4.0 - 10.5 K/uL   RBC 6.23 (H) 4.22 - 5.81 MIL/uL   Hemoglobin 17.8 (H) 13.0 - 17.0 g/dL   HCT 55.7 (H) 39.0 - 52.0 %   MCV 89.4 80.0 - 100.0 fL   MCH 28.6 26.0 - 34.0 pg   MCHC 32.0 30.0 - 36.0 g/dL   RDW 15.0 11.5 - 15.5 %   Platelets 159 150 - 400 K/uL   nRBC 0.0 0.0 - 0.2 %    Comment: Performed at [facility], [place] [address] [place], [place] [address]  Basic metabolic panel     Status: Abnormal   Collection Time: 09/15/20  3:00 AM  Result Value Ref Range   Sodium 135 135 - 145 mmol/L   Potassium 4.0 3.5 - 5.1 mmol/L   Chloride 101 98 -  111 mmol/L   CO2 22 22 - 32 mmol/L   Glucose, Bld 224 (H) 70 - 99 mg/dL    Comment: Glucose reference range applies only to samples taken after fasting for at least 8 hours.   BUN 21 8 - 23 mg/dL   Creatinine, Ser 1.05 0.61 - 1.24 mg/dL   Calcium 9.2 8.9 - 10.3 mg/dL   GFR, Estimated >60 >60 mL/min    Comment: (NOTE) Calculated using the CKD-EPI Creatinine Equation (2021)    Anion gap 12 5 - 15    Comment: Performed at [facility], [place]  [address] [place], [place] [address]  Glucose, capillary     Status: Abnormal   Collection Time: 09/15/20  3:11 AM  Result Value Ref Range   Glucose-Capillary 234 (H) 70 - 99 mg/dL    Comment: Glucose reference range applies only to samples taken after fasting for at least 8 hours.  Glucose, capillary     Status: Abnormal   Collection Time: 09/15/20  7:26 AM  Result Value Ref Range   Glucose-Capillary 299 (H) 70 - 99 mg/dL    Comment: Glucose reference range applies only to samples taken after fasting for at least 8 hours.  Glucose, capillary     Status: Abnormal   Collection Time: 09/15/20 11:07 AM  Result Value Ref Range   Glucose-Capillary 280 (H) 70 - 99 mg/dL    Comment: Glucose reference range applies only to samples taken after fasting for at least 8 hours.  Glucose, capillary     Status: Abnormal   Collection Time: 09/15/20  3:51 PM  Result Value Ref Range   Glucose-Capillary 287 (H) 70 - 99 mg/dL    Comment: Glucose reference range applies only to samples taken after fasting for at least 8 hours.  Glucose, capillary     Status: Abnormal   Collection Time: 09/15/20  7:32 PM  Result Value Ref Range   Glucose-Capillary 242 (H) 70 - 99 mg/dL    Comment: Glucose reference range applies only to samples taken after fasting for at least 8 hours.   Recent Results (from the past 240 hour(s))  Urine culture     Status: Abnormal   Collection Time: 09/08/20 11:35 PM   Specimen: Urine, Clean Catch  Result Value Ref Range Status   Specimen Description   Final    URINE, CLEAN CATCH Performed at [facility], [place] [address] [place], [place] [address]    Special Requests   Final    NONE Performed at [facility], [place] [address] [place], [place] [address]    Culture (A)  Final    <10,000 COLONIES/mL INSIGNIFICANT GROWTH Performed at [place] [address], [place], [place] [address]    Report Status 09/10/2020 FINAL  Final  Resp  Panel by RT-PCR (Flu A&B, Covid) Nasopharyngeal Swab     Status: None   Collection Time: 09/08/20 11:41 PM   Specimen: Nasopharyngeal Swab; Nasopharyngeal(NP) swabs in vial transport medium  Result Value Ref Range Status   SARS Coronavirus 2 by RT PCR NEGATIVE NEGATIVE Final    Comment: (NOTE) SARS-CoV-2 target nucleic acids are NOT DETECTED.  The SARS-CoV-2 RNA is generally detectable in upper respiratory specimens during the acute phase of infection. The lowest concentration of SARS-CoV-2 viral copies this assay can detect is 138 copies/mL. A negative result does not preclude SARS-Cov-2 infection and should not be used as the sole basis for treatment or other patient  management decisions. A negative result may occur with  improper specimen collection/handling, submission of specimen other than nasopharyngeal swab, presence of viral mutation(s) within the areas targeted by this assay, and inadequate number of viral copies(<138 copies/mL). A negative result must be combined with clinical observations, patient history, and epidemiological information. The expected result is Negative.  Fact Sheet for Patients:  EntrepreneurPulse.com.au  Fact Sheet for Healthcare Providers:  IncredibleEmployment.be  This test is no t yet approved or cleared by the [place] FDA and  has been authorized for detection and/or diagnosis of SARS-CoV-2 by FDA under an Emergency Use Authorization (EUA). This EUA will remain  in effect (meaning this test can be used) for the duration of the COVID-19 declaration under Section 564(b)(1) of the Act, 21 U.S.C.section 360bbb-3(b)(1), unless the authorization is terminated  or revoked sooner.       Influenza A by PCR NEGATIVE NEGATIVE Final   Influenza B by PCR NEGATIVE NEGATIVE Final    Comment: (NOTE) The Xpert Xpress SARS-CoV-2/FLU/RSV plus assay is intended as an aid in the diagnosis of influenza from Nasopharyngeal  swab specimens and should not be used as a sole basis for treatment. Nasal washings and aspirates are unacceptable for Xpert Xpress SARS-CoV-2/FLU/RSV testing.  Fact Sheet for Patients: EntrepreneurPulse.com.au  Fact Sheet for Healthcare Providers: IncredibleEmployment.be  This test is not yet approved or cleared by the [place] FDA and has been authorized for detection and/or diagnosis of SARS-CoV-2 by FDA under an Emergency Use Authorization (EUA). This EUA will remain in effect (meaning this test can be used) for the duration of the COVID-19 declaration under Section 564(b)(1) of the Act, 21 U.S.C. section 360bbb-3(b)(1), unless the authorization is terminated or revoked.  Performed at [facility], [place] [address], [place], [place] [address]   Blood culture (routine x 2)     Status: Abnormal   Collection Time: 09/09/20  1:03 AM   Specimen: BLOOD  Result Value Ref Range Status   Specimen Description   Final    BLOOD RIGHT WRIST Performed at [place] [address] [place], [place] [address]    Special Requests   Final    BOTTLES DRAWN AEROBIC AND ANAEROBIC Blood Culture adequate volume Performed at [place] [address], [place], [place] [address]    Culture  Setup Time   Final    GRAM POSITIVE COCCI IN CLUSTERS IN BOTH AEROBIC AND ANAEROBIC BOTTLES CRITICAL RESULT CALLED TO, READ BACK BY AND VERIFIED WITH: PHARMD MICHELLE LILLISTAN 09/09/2020 AT 2220 A.HUGHES    Culture (A)  Final    STAPHYLOCOCCUS HOMINIS UNABLE TO ISOLATE S.EPIDERMIDIS THE SIGNIFICANCE OF ISOLATING THIS ORGANISM FROM A SINGLE SET OF BLOOD CULTURES WHEN MULTIPLE SETS ARE DRAWN IS UNCERTAIN. PLEASE NOTIFY THE MICROBIOLOGY DEPARTMENT WITHIN ONE WEEK IF SPECIATION AND SENSITIVITIES ARE REQUIRED. Performed at [facility], [place] [address] [place], [place] [address]    Report Status  09/13/2020 FINAL  Final  Blood Culture ID Panel (Reflexed)     Status: Abnormal   Collection Time: 09/09/20  1:03 AM  Result Value Ref Range Status   Enterococcus faecalis NOT DETECTED NOT DETECTED Final   Enterococcus Faecium NOT DETECTED NOT DETECTED Final   Listeria monocytogenes NOT DETECTED NOT DETECTED Final   Staphylococcus species DETECTED (A) NOT DETECTED Final    Comment: CRITICAL RESULT CALLED TO, READ BACK BY AND VERIFIED WITH: PHARMD MICHELLE LILLISTAN 09/09/2020 AT 2220 A.HUGHES    Staphylococcus aureus (  BCID) NOT DETECTED NOT DETECTED Final   Staphylococcus epidermidis DETECTED (A) NOT DETECTED Final    Comment: Methicillin (oxacillin) resistant coagulase negative staphylococcus. Possible blood culture contaminant (unless isolated from more than one blood culture draw or clinical case suggests pathogenicity). No antibiotic treatment is indicated for blood  culture contaminants. CRITICAL RESULT CALLED TO, READ BACK BY AND VERIFIED WITH: PHARMD MICHELLE LILLISTAN 09/09/2020 AT 2220 A.HUGHES    Staphylococcus lugdunensis NOT DETECTED NOT DETECTED Final   Streptococcus species NOT DETECTED NOT DETECTED Final   Streptococcus agalactiae NOT DETECTED NOT DETECTED Final   Streptococcus pneumoniae NOT DETECTED NOT DETECTED Final   Streptococcus pyogenes NOT DETECTED NOT DETECTED Final   A.calcoaceticus-baumannii NOT DETECTED NOT DETECTED Final   Bacteroides fragilis NOT DETECTED NOT DETECTED Final   Enterobacterales NOT DETECTED NOT DETECTED Final   Enterobacter cloacae complex NOT DETECTED NOT DETECTED Final   Escherichia coli NOT DETECTED NOT DETECTED Final   Klebsiella aerogenes NOT DETECTED NOT DETECTED Final   Klebsiella oxytoca NOT DETECTED NOT DETECTED Final   Klebsiella pneumoniae NOT DETECTED NOT DETECTED Final   Proteus species NOT DETECTED NOT DETECTED Final   Salmonella species NOT DETECTED NOT DETECTED Final   Serratia marcescens NOT DETECTED NOT DETECTED Final    Haemophilus influenzae NOT DETECTED NOT DETECTED Final   Neisseria meningitidis NOT DETECTED NOT DETECTED Final   Pseudomonas aeruginosa NOT DETECTED NOT DETECTED Final   Stenotrophomonas maltophilia NOT DETECTED NOT DETECTED Final   Candida albicans NOT DETECTED NOT DETECTED Final   Candida auris NOT DETECTED NOT DETECTED Final   Candida glabrata NOT DETECTED NOT DETECTED Final   Candida krusei NOT DETECTED NOT DETECTED Final   Candida parapsilosis NOT DETECTED NOT DETECTED Final   Candida tropicalis NOT DETECTED NOT DETECTED Final   Cryptococcus neoformans/gattii NOT DETECTED NOT DETECTED Final   Methicillin resistance mecA/C DETECTED (A) NOT DETECTED Final    Comment: CRITICAL RESULT CALLED TO, READ BACK BY AND VERIFIED WITH: PHARMD MICHELLE LILLISTAN 09/09/2020 AT 2220 A.HUGHES Performed at [facility], [place] [address] [place], [place] [address]   Blood culture (routine x 2)     Status: None   Collection Time: 09/09/20  1:08 AM   Specimen: BLOOD  Result Value Ref Range Status   Specimen Description   Final    BLOOD LEFT WRIST Performed at [facility] [address] [place], [place] [address]    Special Requests   Final    BOTTLES DRAWN AEROBIC AND ANAEROBIC Blood Culture adequate volume Performed at [facility] [address] [place], [place] [address]    Culture   Final    NO GROWTH 5 DAYS Performed at [facility], [place] [address], [place], [place] [address]    Report Status 09/14/2020 FINAL  Final  MRSA PCR Screening     Status: Abnormal   Collection Time: 09/10/20  5:19 AM   Specimen: Nasal Mucosa; Nasopharyngeal  Result Value Ref Range Status   MRSA by PCR POSITIVE (A) NEGATIVE Final    Comment:        The GeneXpert MRSA Assay (FDA approved for NASAL specimens only), is one component of a comprehensive MRSA colonization surveillance program. It is not intended to diagnose MRSA infection nor to guide  or monitor treatment for MRSA infections. RESULT CALLED TO, READ BACK BY AND VERIFIED WITH: HEAVNER,A. RN AT 1150 09/10/20 MULLINS,T Performed at [facility], [place] [address] [place], [place] [address]  Culture, blood (routine x 2)     Status: None   Collection Time: 09/10/20 11:03 AM   Specimen: BLOOD  Result Value Ref Range Status   Specimen Description   Final    BLOOD LEFT FOREARM Performed at [facility] [address], [place], [place] [address]    Special Requests   Final    BOTTLES DRAWN AEROBIC ONLY Blood Culture results may not be optimal due to an inadequate volume of blood received in culture bottles Performed at [facility] [address] [place], [place] [address]    Culture   Final    NO GROWTH 5 DAYS Performed at [facility], [place] [address], [place], [place] [address]    Report Status 09/15/2020 FINAL  Final  Culture, blood (routine x 2)     Status: None   Collection Time: 09/10/20 11:03 AM   Specimen: BLOOD  Result Value Ref Range Status   Specimen Description   Final    BLOOD RIGHT ANTECUBITAL Performed at [facility] [address], [place], [place] [address]    Special Requests   Final    BOTTLES DRAWN AEROBIC AND ANAEROBIC Blood Culture adequate volume Performed at [facility] [address], [place], [place] [address]    Culture   Final    NO GROWTH 5 DAYS Performed at [facility], [place] [address], [place], [place] [address]    Report Status 09/15/2020 FINAL  Final  Culture, Urine     Status: Abnormal   Collection Time: 09/12/20 11:39 PM   Specimen: Urine, Clean Catch  Result Value Ref Range Status   Specimen Description   Final    URINE, CLEAN CATCH Performed at [facility], [place] [address], [place], [place] [address]    Special Requests   Final    NONE Performed at [facility], [place]  [address], [place], [place] [address]    Culture (A)  Final    >=100,000 COLONIES/mL ENTEROCOCCUS FAECALIS 10,000 COLONIES/mL ACINETOBACTER CALCOACETICUS/BAUMANNII COMPLEX    Report Status 09/15/2020 FINAL  Final   Organism ID, Bacteria ENTEROCOCCUS FAECALIS (A)  Final   Organism ID, Bacteria ACINETOBACTER CALCOACETICUS/BAUMANNII COMPLEX (A)  Final      Susceptibility   Acinetobacter calcoaceticus/baumannii complex - MIC*    CEFTAZIDIME 4 SENSITIVE Sensitive     CIPROFLOXACIN <=0.25 SENSITIVE Sensitive     GENTAMICIN <=1 SENSITIVE Sensitive     IMIPENEM <=0.25 SENSITIVE Sensitive     PIP/TAZO 8 SENSITIVE Sensitive     TRIMETH/SULFA <=20 SENSITIVE Sensitive     AMPICILLIN/SULBACTAM <=2 SENSITIVE Sensitive     * 10,000 COLONIES/mL ACINETOBACTER CALCOACETICUS/BAUMANNII COMPLEX   Enterococcus faecalis - MIC*    AMPICILLIN <=2 SENSITIVE Sensitive     NITROFURANTOIN <=16 SENSITIVE Sensitive     VANCOMYCIN 1 SENSITIVE Sensitive     * >=100,000 COLONIES/mL ENTEROCOCCUS FAECALIS   Creatinine: Recent Labs    09/10/20 0926 09/10/20 1629 09/11/20 0325 09/12/20 0038 09/13/20 0511 09/14/20 0254 09/15/20 0300  CREATININE 2.20* 1.98* 1.62* 1.53* 1.27* 1.00 1.05   Baseline Creatinine: 1  Foley Catheter Placemenet--Complex  Patient was prepped and draped in the usual sterile fashion using betadine solution. 2% viscous lidocaine was inserted per urethra. A 0.038 sensor was advanced per urethra without significant resistance or recoiling. Upon passage through the bladder neck with the wire urine was noted to emanate from the urethral meatus. Using the heyman dilators  the penile urethral stricture was sequentially dilated from 8 french to 18 french. A 18 Fr council catheter was placed via the Blitz technique and passed easily with immediate return of >1000 cc of clear, amber colored urine without clot. 10 cc sterile water was placed in the balloon port.    Impression/Assessment:  85yo with  urinary retention and penile urethral stricture  Plan:  Urinary retention: Please continue indwelling foley pending improvement in his encephalopathy. Due to his urethral stricture the foley should remain in place for 1 week Urethral stricture: 16 french foley placed after dilating penile urethral strictures from 7F to 17F. The foley should remain in place for 1 week prior to attempting a voiding trial  Nicolette Bang 09/15/2020, 8:16 PM

## 2020-09-15 NOTE — Progress Notes (Signed)
Two urology RN s attempted to place an 18F coude catheter but met immediate resistance and were unsuccessful. Primary RN and charge RN made aware.    

## 2020-09-15 NOTE — Progress Notes (Signed)
DM2Inpatient Diabetes Program Recommendations  AACE/ADA: New Consensus Statement on Inpatient Glycemic Control (2015)  Target Ranges:  Prepandial:   less than 140 mg/dL      Peak postprandial:   less than 180 mg/dL (1-2 hours)      Critically ill patients:  140 - 180 mg/dL   Lab Results  Component Value Date   GLUCAP 287 (H) 09/15/2020   HGBA1C 9.1 (H) 04/26/2019    Review of Glycemic Control  Diabetes history: DM2 Outpatient Diabetes medications: Lantus 90 units BID (wife verified per RN), metformin 500 mg with breakfast, Ozempic 0.5 mg Q weekly on Tuesday. Current orders for Inpatient glycemic control: Lantus 5 units QD, Novolog 0-9 units Q4H.  HgbA1C - 9.1% (04/26/19)   Inpatient Diabetes Program Recommendations:    Increase Lantus to 5 units BID Increase Novolog to 0-15 units Q4H Need updated HgbA1C to assess glycemic control prior to admission.  Follow glucose trends.  Thank you. Lorenda Peck, RD, LDN, CDE Inpatient Diabetes Coordinator [phone]

## 2020-09-15 NOTE — Progress Notes (Signed)
[facility] PROGRESS NOTE   Jason Hartman RSW:[id number] DOB: [birth year] DOA: 09/08/2020  PCP: [name], MD  Brief History/Interval Summary: 85 y.o. male with medical history significant for OSA, Parkinson's, restless leg syndrome, anxiety, chronic pain, chronic kidney disease, and insulin-dependent diabetes mellitus, who presented to the emergency department for evaluation of anxiety and diaphoresis.  Patient has had a lot of medication changes recently including discontinuation of alprazolam and initiation of tramadol Celexa BuSpar.  During the course of this hospital stay has become very encephalopathic.  Neurology was consulted.  He had to be moved to the stepdown unit.    Reason for Visit: Acute metabolic encephalopathy  Consultants:  Neurology Pulmonology Cardiology Urology  Procedures:   EEG did not show any epileptiform or seizure activity  Antibiotics: Anti-infectives (From admission, onward)    Start     Dose/Rate Route Frequency Ordered Stop   09/15/20 1000  Ampicillin-Sulbactam (UNASYN) 3 g in sodium chloride 0.9 % 100 mL IVPB        3 g 200 mL/hr over 30 Minutes Intravenous Every 8 hours 09/15/20 0821     09/13/20 0000  cefTRIAXone (ROCEPHIN) 1 g in sodium chloride 0.9 % 100 mL IVPB  Status:  Discontinued        1 g 200 mL/hr over 30 Minutes Intravenous Every 24 hours 09/12/20 2339 09/15/20 0809   09/09/20 0115  aztreonam (AZACTAM) 1 g in sodium chloride 0.9 % 100 mL IVPB        1 g 200 mL/hr over 30 Minutes Intravenous  Once 09/09/20 0106 09/09/20 0307       Subjective/Interval History: Patient remains distracted and mildly confused.  Remains in mittens.  Continues to have abdominal pain.  History is limited.    Assessment/Plan:  Acute metabolic encephalopathy Etiology is unclear.  Differential diagnosis has been broad including polypharmacy, withdrawal, serotonin syndrome which has been less likely.  UTI could also be contributing. LP was  considered however it appears that his wife did not want him to undergo this test.  Since meningitis seems to be less likely it is reasonable not to pursue the LP at this time.   CT head shows chronic changes without any acute findings. EEG did not show any epileptiform activity. TSH 1.09 with free T4 of 1.3. Cortisol level 35.5.  K93 was normal.  Folic acid ammonia levels were normal. UA was noted to be abnormal.  Patient was complaining of dysuria and had suprapubic tenderness.  Started on ceftriaxone. Patient was requiring Precedex infusion which has been discontinued.   Neurology has signed off.  They did suggest consulting psychiatry. However the fact that patient is now found to have distention of urinary bladder could be contributing to his alteration in mental status and elevated blood pressure.  We will wait for this issue to resolve before consulting psychiatry.   Dose of Valium was decreased yesterday.    Urinary bladder distention with possible bladder outlet obstruction CT scan raised concern for distention of the urinary bladder.  This is concerning for some form of bladder outlet obstruction.  Could be the reason for his suprapubic tenderness.  He has been urinating though.  Attempts to place Foley catheter during the course of the night have been unsuccessful.  A coud catheter was attempted by urology fluid nursing staff and they were also unsuccessful.  Discussed with Dr. Alyson Ingles with urology who will come by to evaluate patient.  Urinary tract infection  UA was noted to be  abnormal.  Patient was placed on ceftriaxone.  Culture showing Enterococcus as well as Acinetobacter.  Discussed with pharmacy.  Pharmacy to verify patient's penicillin allergy with his spouse and then will change antibiotic to Unasyn if possible. If not we may have to consider a fluoroquinolone.    Essential hypertension/transient hypotension Was transiently on Levophed.  Significantly elevated blood  pressure most likely due to his bladder distention and retention.  Should improve as his obstruction is relieved hopefully sometime this morning.   Continue metoprolol.  Patient's ARB was also resumed yesterday.  Also on as needed medications.    Mild rhabdomyolysis CK level improved from 2578 to 493.    Atrial flutter, new diagnosis Echocardiogram showed normal EF.  Cardiology has been following.  Patient was on heparin infusion which has been transitioned to apixaban.  Patient remains on amiodarone and metoprolol.  Staph hominis bacteremia Thought to be contaminant.  Repeat cultures were negative.  Obstructive sleep apnea CPAP nightly.  Elevated D-dimer Lower extremity Doppler studies negative.  VQ scan low probability.  History of parkinsonism's Noted to be on Rotigotine patch.  History of anxiety Recently stopped Xanax and benzodiazepine withdrawal was considered.  Apparently has been taking gabapentin which is currently on hold. Valium was changed to oral on a scheduled basis.  History of restless leg syndrome Gabapentin is on hold.  Acute kidney injury on chronic kidney disease stage IIIa Electrolytes are stable.  Monitor urine output.  Insulin-dependent diabetes mellitus HbA1c was 6.5 in May.  CBGs noted to be elevated most likely due to D5 infusion.  Initiate basal insulin.  Continue SSI.    Obesity Estimated body mass index is 30.99 kg/m as calculated from the following:   Height as of this encounter: 5\' 4"  (1.626 m).   Weight as of this encounter: 81.9 kg.    DVT Prophylaxis: Apixaban Code Status: Full code Family Communication: No family at bedside.  Wife was updated yesterday.  We will do so again today. Disposition Plan: May need to go to rehab.  PT and OT evaluation.  Status is: Inpatient  Remains inpatient appropriate because:Altered mental status and IV treatments appropriate due to intensity of illness or inability to take PO   Dispo: The patient  is from: Home              Anticipated d/c is to: SNF              Patient currently is not medically stable to d/c.   Difficult to place patient No       Medications:  Scheduled:  amiodarone  200 mg Oral Daily   apixaban  5 mg Oral BID   diazepam  5 mg Oral QID   insulin aspart  0-9 Units Subcutaneous Q4H   irbesartan  300 mg Oral Daily   mouth rinse  15 mL Mouth Rinse BID   metoprolol succinate  100 mg Oral Daily   Rotigotine  1 patch Transdermal QPM   thiamine  100 mg Oral Daily   Continuous:  sodium chloride     sodium chloride     ampicillin-sulbactam (UNASYN) IV Stopped (09/15/20 1009)   dextrose 5% lactated ringers with KCl 20 mEq/L 75 mL/hr at 09/15/20 1135   PRN:[CANCELED] Place/Maintain arterial line **AND** sodium chloride, acetaminophen **OR** acetaminophen, diazepam, diphenhydrAMINE-zinc acetate, hydrALAZINE, HYDROmorphone (DILAUDID) injection, naLOXone (NARCAN)  injection, ofloxacin, ondansetron **OR** ondansetron (ZOFRAN) IV, senna-docusate, technetium albumin aggregated   Objective:  Vital Signs  Vitals:   09/15/20 0900  09/15/20 0944 09/15/20 1000 09/15/20 1100  BP: (!) 200/76 (!) 179/90 (!) 185/126 (!) 196/94  Pulse: (!) 111 (!) 102 (!) 101 93  Resp: 20   (!) 29  Temp:      TempSrc:      SpO2: 93%  92% (!) 86%  Weight:      Height:        Intake/Output Summary (Last 24 hours) at 09/15/2020 1150 Last data filed at 09/15/2020 1135 Gross per 24 hour  Intake 2234.42 ml  Output 1750 ml  Net 484.42 ml    Filed Weights   09/13/20 0412 09/14/20 0443 09/15/20 0500  Weight: 86 kg 84.3 kg 81.9 kg   General appearance: Awake and alert but distracted and confused. Resp: Clear to auscultation bilaterally.  Normal effort Cardio: S1-S2 is normal regular.  No S3-S4.  No rubs murmurs or bruit GI: Abdomen is soft.  Remains tender in the suprapubic area with some guarding.  Bowel sounds present.   Extremities: No edema.  Moving all of his  extremities Neurologic: Remains disoriented.  No focal neurological deficits.      Lab Results:  Data Reviewed: I have personally reviewed following labs and imaging studies  CBC: Recent Labs  Lab 09/08/20 2308 09/08/20 2339 09/11/20 0325 09/12/20 0038 09/13/20 0511 09/14/20 0254 09/15/20 0300  WBC 15.7*   < > 16.8* 13.6* 9.0 8.9 15.5*  NEUTROABS 14.0*  --  13.9* 11.5*  --   --   --   HGB 19.0*   < > 17.4* 15.1 14.6 14.7 17.8*  HCT 58.5*   < > 54.4* 48.9 47.0 47.4 55.7*  MCV 87.2   < > 89.3 92.6 91.4 91.9 89.4  PLT 200   < > 167 152 111* 125* 159   < > = values in this interval not displayed.     Basic Metabolic Panel: Recent Labs  Lab 09/11/20 0325 09/12/20 0038 09/13/20 0511 09/14/20 0254 09/15/20 0300  NA 140 132* 138 137 135  K 3.5 3.9 4.4 4.2 4.0  CL 106 102 108 107 101  CO2 25 23 22 25 22   GLUCOSE 103* 164* 177* 178* 224*  BUN 49* 42* 30* 21 21  CREATININE 1.62* 1.53* 1.27* 1.00 1.05  CALCIUM 8.8* 8.2* 8.7* 8.8* 9.2  MG 2.1 1.9  --   --   --   PHOS 3.9 3.0  --   --   --      GFR: Estimated Creatinine Clearance: 52.4 mL/min (by C-G formula based on SCr of 1.05 mg/dL).  Liver Function Tests: Recent Labs  Lab 09/08/20 2308 09/10/20 0926 09/11/20 0325 09/12/20 0038 09/13/20 0511  AST 27 64* 109* 49* 34  ALT 32 37 48* 38 35  ALKPHOS 65 46 50 38 46  BILITOT 2.7* 1.8* 1.6* 2.0* 0.9  PROT 7.6 5.8* 6.2* 5.2* 5.4*  ALBUMIN 4.3 3.4* 3.7 2.9* 3.0*      Recent Labs  Lab 09/10/20 1108  AMMONIA 23     Coagulation Profile: Recent Labs  Lab 09/10/20 1108  INR 1.2     Cardiac Enzymes: Recent Labs  Lab 09/09/20 0635 09/10/20 1108 09/11/20 0910 09/12/20 0038  CKTOTAL 263 2,578* 1,227* 493*      CBG: Recent Labs  Lab 09/14/20 1929 09/14/20 2303 09/15/20 0311 09/15/20 0726 09/15/20 1107  GLUCAP 261* 243* 234* 299* 280*       Recent Results (from the past 240 hour(s))  Urine culture     Status: Abnormal  Collection  Time: 09/08/20 11:35 PM   Specimen: Urine, Clean Catch  Result Value Ref Range Status   Specimen Description   Final    URINE, CLEAN CATCH Performed at [facility], [place] [address], [place], [place] [address]    Special Requests   Final    NONE Performed at [facility], [place] [address] [place], [place] [address]    Culture (A)  Final    <10,000 COLONIES/mL INSIGNIFICANT GROWTH Performed at [facility] [address], [place], [place] [address]    Report Status 09/10/2020 FINAL  Final  Resp Panel by RT-PCR (Flu A&B, Covid) Nasopharyngeal Swab     Status: None   Collection Time: 09/08/20 11:41 PM   Specimen: Nasopharyngeal Swab; Nasopharyngeal(NP) swabs in vial transport medium  Result Value Ref Range Status   SARS Coronavirus 2 by RT PCR NEGATIVE NEGATIVE Final    Comment: (NOTE) SARS-CoV-2 target nucleic acids are NOT DETECTED.  The SARS-CoV-2 RNA is generally detectable in upper respiratory specimens during the acute phase of infection. The lowest concentration of SARS-CoV-2 viral copies this assay can detect is 138 copies/mL. A negative result does not preclude SARS-Cov-2 infection and should not be used as the sole basis for treatment or other patient management decisions. A negative result may occur with  improper specimen collection/handling, submission of specimen other than nasopharyngeal swab, presence of viral mutation(s) within the areas targeted by this assay, and inadequate number of viral copies(<138 copies/mL). A negative result must be combined with clinical observations, patient history, and epidemiological information. The expected result is Negative.  Fact Sheet for Patients:  EntrepreneurPulse.com.au  Fact Sheet for Healthcare Providers:  IncredibleEmployment.be  This test is no t yet approved or cleared by the [place] FDA and  has been authorized for detection and/or  diagnosis of SARS-CoV-2 by FDA under an Emergency Use Authorization (EUA). This EUA will remain  in effect (meaning this test can be used) for the duration of the COVID-19 declaration under Section 564(b)(1) of the Act, 21 U.S.C.section 360bbb-3(b)(1), unless the authorization is terminated  or revoked sooner.       Influenza A by PCR NEGATIVE NEGATIVE Final   Influenza B by PCR NEGATIVE NEGATIVE Final    Comment: (NOTE) The Xpert Xpress SARS-CoV-2/FLU/RSV plus assay is intended as an aid in the diagnosis of influenza from Nasopharyngeal swab specimens and should not be used as a sole basis for treatment. Nasal washings and aspirates are unacceptable for Xpert Xpress SARS-CoV-2/FLU/RSV testing.  Fact Sheet for Patients: EntrepreneurPulse.com.au  Fact Sheet for Healthcare Providers: IncredibleEmployment.be  This test is not yet approved or cleared by the [place] FDA and has been authorized for detection and/or diagnosis of SARS-CoV-2 by FDA under an Emergency Use Authorization (EUA). This EUA will remain in effect (meaning this test can be used) for the duration of the COVID-19 declaration under Section 564(b)(1) of the Act, 21 U.S.C. section 360bbb-3(b)(1), unless the authorization is terminated or revoked.  Performed at [facility], [place] [address] [place], [place] [address]   Blood culture (routine x 2)     Status: Abnormal   Collection Time: 09/09/20  1:03 AM   Specimen: BLOOD  Result Value Ref Range Status   Specimen Description   Final    BLOOD RIGHT WRIST Performed at [facility] [address], [name], [place] [address]    Special Requests   Final    BOTTLES DRAWN AEROBIC  AND ANAEROBIC Blood Culture adequate volume Performed at [facility] [address], [place], [place] [address]    Culture  Setup Time   Final    GRAM POSITIVE COCCI IN CLUSTERS IN BOTH  AEROBIC AND ANAEROBIC BOTTLES CRITICAL RESULT CALLED TO, READ BACK BY AND VERIFIED WITH: PHARMD MICHELLE LILLISTAN 09/09/2020 AT 2220 A.HUGHES    Culture (A)  Final    STAPHYLOCOCCUS HOMINIS UNABLE TO ISOLATE S.EPIDERMIDIS THE SIGNIFICANCE OF ISOLATING THIS ORGANISM FROM A SINGLE SET OF BLOOD CULTURES WHEN MULTIPLE SETS ARE DRAWN IS UNCERTAIN. PLEASE NOTIFY THE MICROBIOLOGY DEPARTMENT WITHIN ONE WEEK IF SPECIATION AND SENSITIVITIES ARE REQUIRED. Performed at [facility], [place] [address], [place], [place] [address]    Report Status 09/13/2020 FINAL  Final  Blood Culture ID Panel (Reflexed)     Status: Abnormal   Collection Time: 09/09/20  1:03 AM  Result Value Ref Range Status   Enterococcus faecalis NOT DETECTED NOT DETECTED Final   Enterococcus Faecium NOT DETECTED NOT DETECTED Final   Listeria monocytogenes NOT DETECTED NOT DETECTED Final   Staphylococcus species DETECTED (A) NOT DETECTED Final    Comment: CRITICAL RESULT CALLED TO, READ BACK BY AND VERIFIED WITH: PHARMD MICHELLE LILLISTAN 09/09/2020 AT 2220 A.HUGHES    Staphylococcus aureus (BCID) NOT DETECTED NOT DETECTED Final   Staphylococcus epidermidis DETECTED (A) NOT DETECTED Final    Comment: Methicillin (oxacillin) resistant coagulase negative staphylococcus. Possible blood culture contaminant (unless isolated from more than one blood culture draw or clinical case suggests pathogenicity). No antibiotic treatment is indicated for blood  culture contaminants. CRITICAL RESULT CALLED TO, READ BACK BY AND VERIFIED WITH: PHARMD MICHELLE LILLISTAN 09/09/2020 AT 2220 A.HUGHES    Staphylococcus lugdunensis NOT DETECTED NOT DETECTED Final   Streptococcus species NOT DETECTED NOT DETECTED Final   Streptococcus agalactiae NOT DETECTED NOT DETECTED Final   Streptococcus pneumoniae NOT DETECTED NOT DETECTED Final   Streptococcus pyogenes NOT DETECTED NOT DETECTED Final   A.calcoaceticus-baumannii NOT DETECTED NOT DETECTED Final    Bacteroides fragilis NOT DETECTED NOT DETECTED Final   Enterobacterales NOT DETECTED NOT DETECTED Final   Enterobacter cloacae complex NOT DETECTED NOT DETECTED Final   Escherichia coli NOT DETECTED NOT DETECTED Final   Klebsiella aerogenes NOT DETECTED NOT DETECTED Final   Klebsiella oxytoca NOT DETECTED NOT DETECTED Final   Klebsiella pneumoniae NOT DETECTED NOT DETECTED Final   Proteus species NOT DETECTED NOT DETECTED Final   Salmonella species NOT DETECTED NOT DETECTED Final   Serratia marcescens NOT DETECTED NOT DETECTED Final   Haemophilus influenzae NOT DETECTED NOT DETECTED Final   Neisseria meningitidis NOT DETECTED NOT DETECTED Final   Pseudomonas aeruginosa NOT DETECTED NOT DETECTED Final   Stenotrophomonas maltophilia NOT DETECTED NOT DETECTED Final   Candida albicans NOT DETECTED NOT DETECTED Final   Candida auris NOT DETECTED NOT DETECTED Final   Candida glabrata NOT DETECTED NOT DETECTED Final   Candida krusei NOT DETECTED NOT DETECTED Final   Candida parapsilosis NOT DETECTED NOT DETECTED Final   Candida tropicalis NOT DETECTED NOT DETECTED Final   Cryptococcus neoformans/gattii NOT DETECTED NOT DETECTED Final   Methicillin resistance mecA/C DETECTED (A) NOT DETECTED Final    Comment: CRITICAL RESULT CALLED TO, READ BACK BY AND VERIFIED WITH: PHARMD MICHELLE LILLISTAN 09/09/2020 AT 2220 A.HUGHES Performed at [facility], [place] [address], [place], [place] [address]   Blood culture (routine x 2)     Status: None   Collection Time: 09/09/20  1:08 AM  Specimen: BLOOD  Result Value Ref Range Status   Specimen Description   Final    BLOOD LEFT WRIST Performed at [facility] [address], [place], [place] [address]    Special Requests   Final    BOTTLES DRAWN AEROBIC AND ANAEROBIC Blood Culture adequate volume Performed at [facility] [address], [place], [place] [address]    Culture   Final    NO GROWTH 5  DAYS Performed at [facility], [place] [address], [place], [place] [address]    Report Status 09/14/2020 FINAL  Final  MRSA PCR Screening     Status: Abnormal   Collection Time: 09/10/20  5:19 AM   Specimen: Nasal Mucosa; Nasopharyngeal  Result Value Ref Range Status   MRSA by PCR POSITIVE (A) NEGATIVE Final    Comment:        The GeneXpert MRSA Assay (FDA approved for NASAL specimens only), is one component of a comprehensive MRSA colonization surveillance program. It is not intended to diagnose MRSA infection nor to guide or monitor treatment for MRSA infections. RESULT CALLED TO, READ BACK BY AND VERIFIED WITH: HEAVNER,A. RN AT 1150 09/10/20 MULLINS,T Performed at [facility], [place] [address], [place], [place] [address]   Culture, blood (routine x 2)     Status: None   Collection Time: 09/10/20 11:03 AM   Specimen: BLOOD  Result Value Ref Range Status   Specimen Description   Final    BLOOD LEFT FOREARM Performed at [facility] [address] [place], [place] [address]    Special Requests   Final    BOTTLES DRAWN AEROBIC ONLY Blood Culture results may not be optimal due to an inadequate volume of blood received in culture bottles Performed at [facility] [address] [place], [place] [address]    Culture   Final    NO GROWTH 5 DAYS Performed at [facility], [place] [address] [place], [place] [address]    Report Status 09/15/2020 FINAL  Final  Culture, blood (routine x 2)     Status: None   Collection Time: 09/10/20 11:03 AM   Specimen: BLOOD  Result Value Ref Range Status   Specimen Description   Final    BLOOD RIGHT ANTECUBITAL Performed at [facility] [address] [place], [place] [address]    Special Requests   Final    BOTTLES DRAWN AEROBIC AND ANAEROBIC Blood Culture adequate volume Performed at [facility] [address] [place],  [place] [address]    Culture   Final    NO GROWTH 5 DAYS Performed at [facility], [place] [address], [place], [place] [address]    Report Status 09/15/2020 FINAL  Final  Culture, Urine     Status: Abnormal   Collection Time: 09/12/20 11:39 PM   Specimen: Urine, Clean Catch  Result Value Ref Range Status   Specimen Description   Final    URINE, CLEAN CATCH Performed at [facility], [place] [address], [place], [place] [address]    Special Requests   Final    NONE Performed at [facility], [place] [address] [place],  [address]    Culture (A)  Final    >=100,000 COLONIES/mL ENTEROCOCCUS FAECALIS 10,000 COLONIES/mL ACINETOBACTER CALCOACETICUS/BAUMANNII COMPLEX    Report Status 09/15/2020 FINAL  Final   Organism ID, Bacteria ENTEROCOCCUS FAECALIS (A)  Final  Organism ID, Bacteria ACINETOBACTER CALCOACETICUS/BAUMANNII COMPLEX (A)  Final      Susceptibility   Acinetobacter calcoaceticus/baumannii complex - MIC*    CEFTAZIDIME 4 SENSITIVE Sensitive     CIPROFLOXACIN <=0.25 SENSITIVE Sensitive     GENTAMICIN <=1 SENSITIVE Sensitive     IMIPENEM <=0.25 SENSITIVE Sensitive     PIP/TAZO 8 SENSITIVE Sensitive     TRIMETH/SULFA <=20 SENSITIVE Sensitive     AMPICILLIN/SULBACTAM <=2 SENSITIVE Sensitive     * 10,000 COLONIES/mL ACINETOBACTER CALCOACETICUS/BAUMANNII COMPLEX   Enterococcus faecalis - MIC*    AMPICILLIN <=2 SENSITIVE Sensitive     NITROFURANTOIN <=16 SENSITIVE Sensitive     VANCOMYCIN 1 SENSITIVE Sensitive     * >=100,000 COLONIES/mL ENTEROCOCCUS FAECALIS      Radiology Studies: CT ABDOMEN PELVIS WO CONTRAST  Result Date: 09/14/2020 CLINICAL DATA:  Acute non localized abdominal pain. EXAM: CT ABDOMEN AND PELVIS WITHOUT CONTRAST TECHNIQUE: Multidetector CT imaging of the abdomen and pelvis was performed following the standard protocol without IV contrast. COMPARISON:  Ultrasound 5 days ago.  CT abdomen 02/19/2020. FINDINGS: Lower chest:  Mild atelectasis at the lung bases which obscures the small nodules discussed on the previous exam. No pleural effusion. Hepatobiliary: No liver abnormality seen without contrast. No calcified gallstones. Pancreas: Normal Spleen: Normal Adrenals/Urinary Tract: Adrenal glands are normal. Right kidney shows a few punctate nonobstructing calculi. Renal cysts are present as seen previously, the largest in the lateral midportion measuring 3.7 cm in diameter. There is hydroureteronephrosis with the ureter dilated all the way to the bladder. No obstructing lesion seen at the UVJ. Left kidney shows a few small cysts as seen previously. Fullness of the renal collecting system in ureter on this side as well all the way to the bladder, without obstructing lesion. The bladder is distended. No obstructing lesion evident within the prostate or proximal penile urethra. Stomach/Bowel: Stomach is normal. Small intestine is normal. Colon is normal. Vascular/Lymphatic: Aortic atherosclerosis. No aneurysm. IVC is normal. No retroperitoneal adenopathy. Reproductive: Negative Other: No free fluid or air. Musculoskeletal: Chronic lower lumbar degenerative changes. IMPRESSION: Distended bladder. This could be neurogenic or due to relative outlet obstruction. Backflow dilatation of the renal collecting systems and ureters without evidence of obstructing lesion. Chronic renal cysts. Two nonobstructing renal calculi on the right. Atelectasis at both lung bases. Cannot evaluate the small peripheral densities described in the left lower lobe on the study last November. Aortic atherosclerosis. Electronically Signed   By: Nelson Chimes M.D.   On: 09/14/2020 19:33        LOS: 5 days   [facility] Pager on [email or web address]  09/15/2020, 11:50 AM

## 2020-09-16 ENCOUNTER — Inpatient Hospital Stay (HOSPITAL_COMMUNITY): Payer: Medicare Other

## 2020-09-16 LAB — CBC
HCT: 56.4 % — ABNORMAL HIGH (ref 39.0–52.0)
Hemoglobin: 17.6 g/dL — ABNORMAL HIGH (ref 13.0–17.0)
MCH: 28.6 pg (ref 26.0–34.0)
MCHC: 31.2 g/dL (ref 30.0–36.0)
MCV: 91.7 fL (ref 80.0–100.0)
Platelets: 191 10*3/uL (ref 150–400)
RBC: 6.15 MIL/uL — ABNORMAL HIGH (ref 4.22–5.81)
RDW: 14.7 % (ref 11.5–15.5)
WBC: 15.6 10*3/uL — ABNORMAL HIGH (ref 4.0–10.5)
nRBC: 0 % (ref 0.0–0.2)

## 2020-09-16 LAB — BLOOD GAS, VENOUS
Acid-Base Excess: 0.6 mmol/L (ref 0.0–2.0)
Bicarbonate: 24.1 mmol/L (ref 20.0–28.0)
O2 Saturation: 75.4 %
Patient temperature: 98.6
pCO2, Ven: 37.5 mmHg — ABNORMAL LOW (ref 44.0–60.0)
pH, Ven: 7.424 (ref 7.250–7.430)
pO2, Ven: 44.1 mmHg (ref 32.0–45.0)

## 2020-09-16 LAB — GLUCOSE, CAPILLARY
Glucose-Capillary: 200 mg/dL — ABNORMAL HIGH (ref 70–99)
Glucose-Capillary: 204 mg/dL — ABNORMAL HIGH (ref 70–99)
Glucose-Capillary: 212 mg/dL — ABNORMAL HIGH (ref 70–99)
Glucose-Capillary: 213 mg/dL — ABNORMAL HIGH (ref 70–99)
Glucose-Capillary: 224 mg/dL — ABNORMAL HIGH (ref 70–99)
Glucose-Capillary: 228 mg/dL — ABNORMAL HIGH (ref 70–99)
Glucose-Capillary: 257 mg/dL — ABNORMAL HIGH (ref 70–99)

## 2020-09-16 LAB — BASIC METABOLIC PANEL
Anion gap: 11 (ref 5–15)
BUN: 42 mg/dL — ABNORMAL HIGH (ref 8–23)
CO2: 23 mmol/L (ref 22–32)
Calcium: 9.1 mg/dL (ref 8.9–10.3)
Chloride: 107 mmol/L (ref 98–111)
Creatinine, Ser: 1.34 mg/dL — ABNORMAL HIGH (ref 0.61–1.24)
GFR, Estimated: 53 mL/min — ABNORMAL LOW (ref >60)
Glucose, Bld: 213 mg/dL — ABNORMAL HIGH (ref 70–99)
Potassium: 4.2 mmol/L (ref 3.5–5.1)
Sodium: 141 mmol/L (ref 135–145)

## 2020-09-16 LAB — TROPONIN I (HIGH SENSITIVITY): Troponin I (High Sensitivity): 22 ng/L — ABNORMAL HIGH (ref <18)

## 2020-09-16 IMAGING — DX DG CHEST 1V PORT
1 series · 1 of 1 positions shown · non-contrast
Comparison: Prior chest x-ray yesterday [DATE]

CLINICAL DATA: Hypoxia

EXAM:
PORTABLE CHEST 1 VIEW

[chest ap]
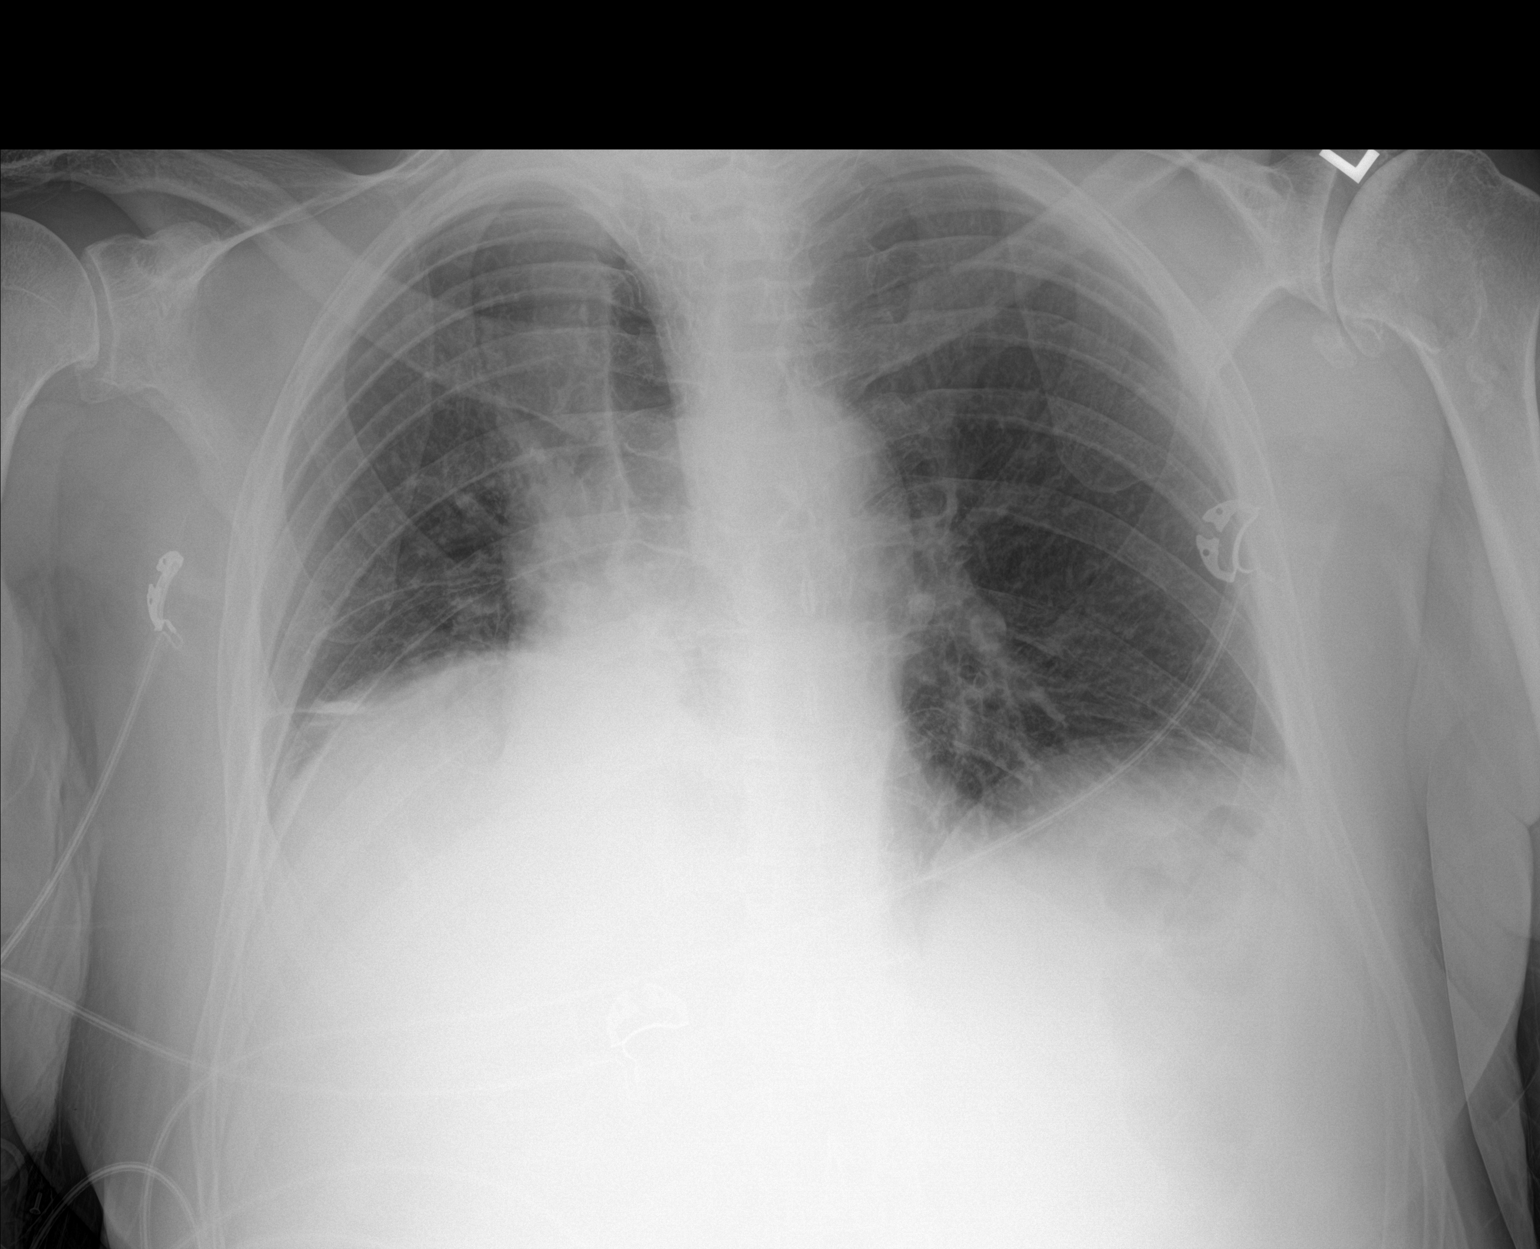

[1 of 1 positions shown; findings below may reference images not displayed]

FINDINGS: Patient is markedly rotated toward the right. This results in
distortion of the cardiac and mediastinal contours. Grossly, the
heart and mediastinum appear unchanged. Atherosclerotic
calcifications again noted in the transverse aorta. Progressive
elevation of the right hemidiaphragm with increasing linear
perihilar and basilar opacities most consistent with atelectasis.
The aerated portions of the lungs are otherwise clear. No
significant pulmonary vascular congestion or evidence of edema. No
large effusion or pneumothorax. Left greater than right humeral
joint osteoarthritis.
IMPRESSION: 1. Patient is significantly rotated toward the right.
2. Increasing right perihilar and basilar atelectasis with
progressive elevation of the right hemidiaphragm.
3. Otherwise, no new acute cardiopulmonary process.

## 2020-09-16 IMAGING — CT CT HEAD W/O CM
4 series · 16 of 47 positions shown, 18 images · non-contrast
Comparison: [DATE]

CLINICAL DATA: Mental status change

EXAM:
CT HEAD WITHOUT CONTRAST
TECHNIQUE: Contiguous axial images were obtained from the base of the skull
through the vertex without intravenous contrast.

[Series 2: head wo · axial · 0.47mm/px · z∈[-157,-32]mm · 7 of 35 slices shown, 9 images]
[im 5/35  brain]
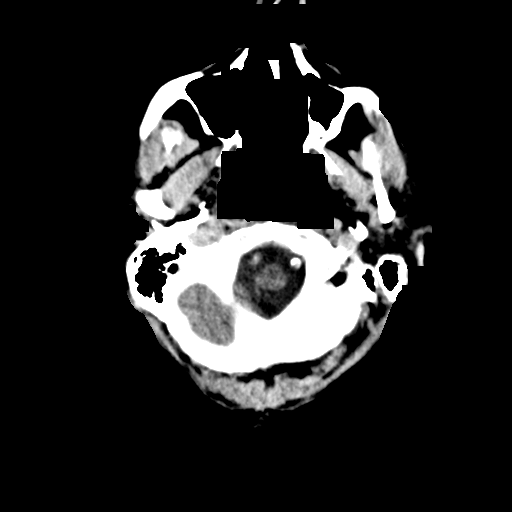
[im 5/35  bone]
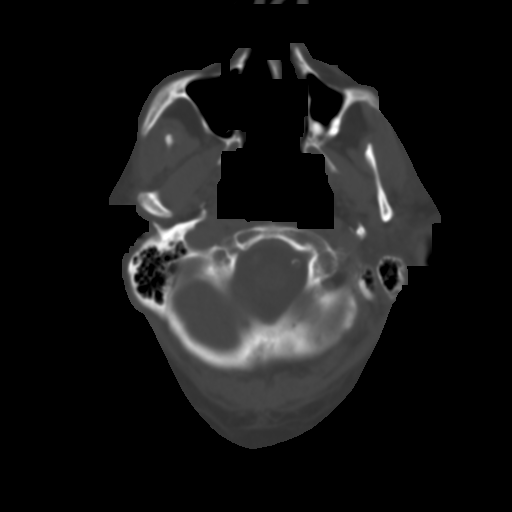
[im 9/35  brain]
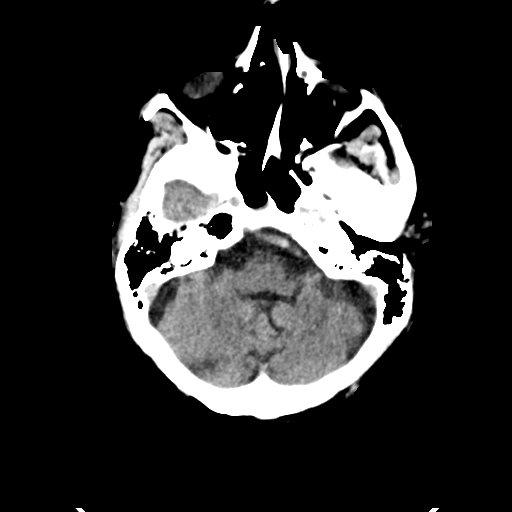
[im 13/35  brain]
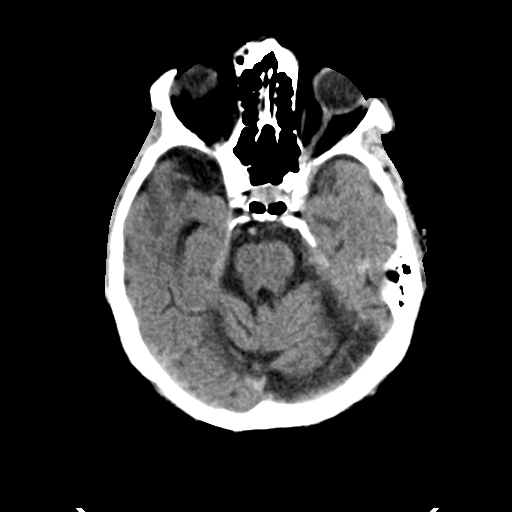
[im 18/35  brain]
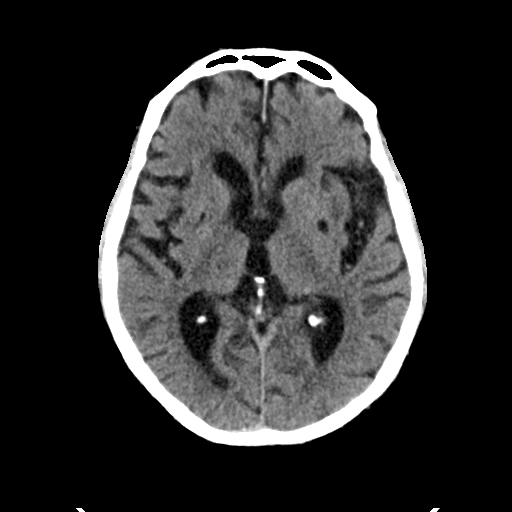
[im 22/35  brain]
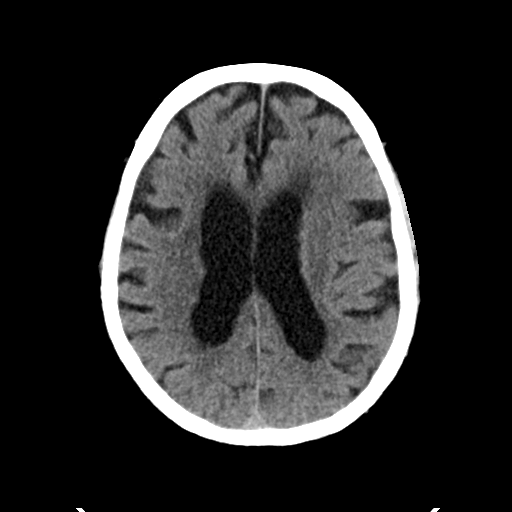
[im 22/35  bone]
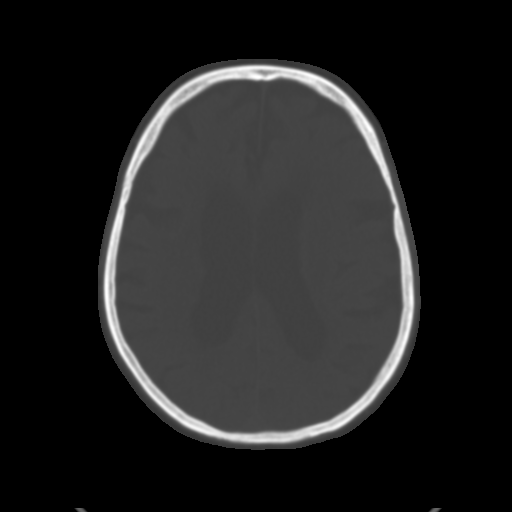
[im 26/35  brain]
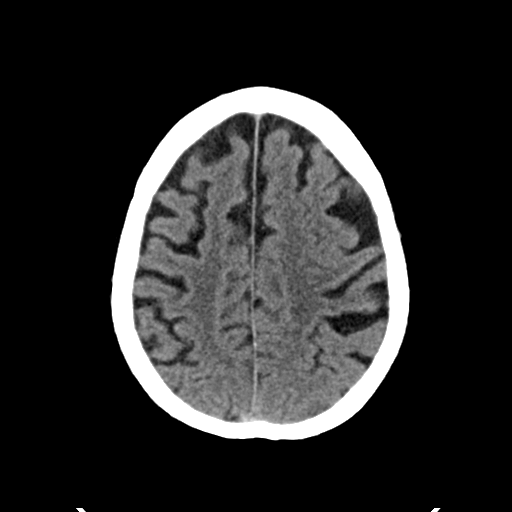
[im 30/35  brain]
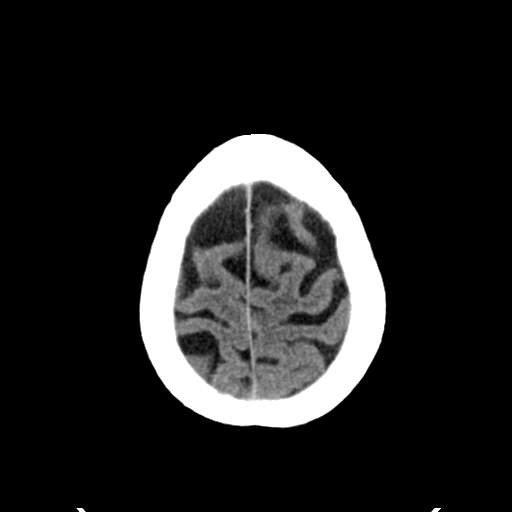

[Series 3: head bone · axial · 0.47mm/px · z∈[-161,-127]mm · 3 of 87 slices shown]
[im 9/87  bone]
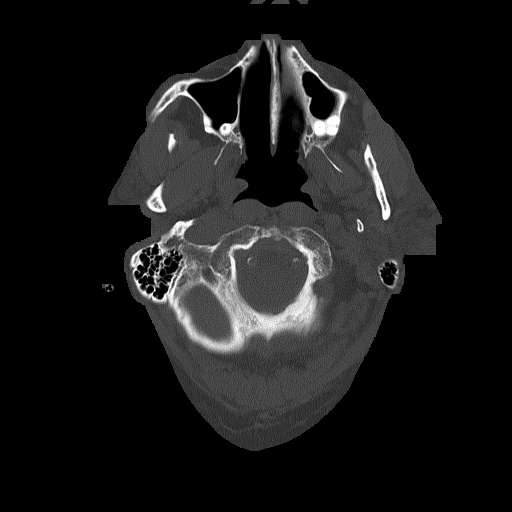
[im 18/87  bone]
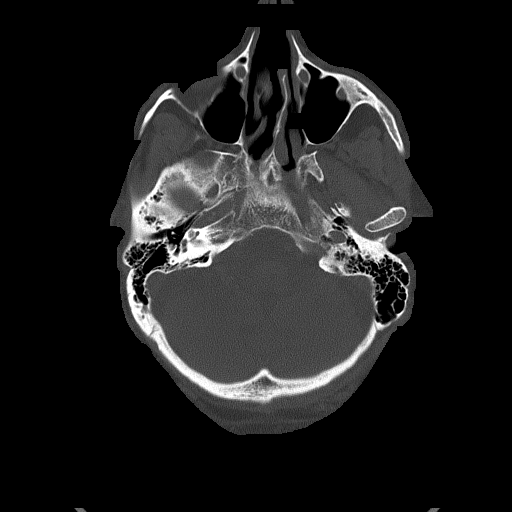
[im 26/87  bone]
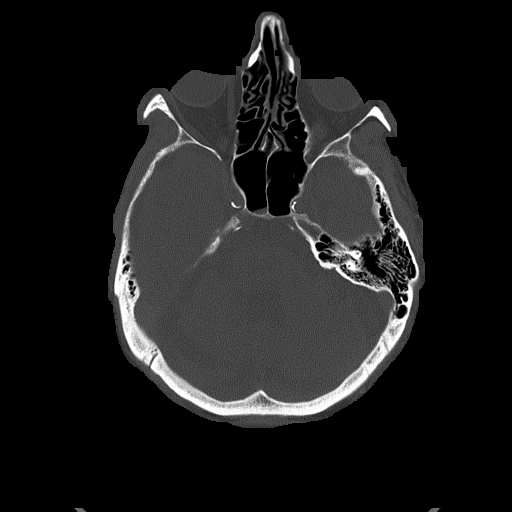

[Series 4: coronal soft tissue · coronal · 0.35mm/px · 3 of 76 slices shown]
[im 26/76  brain]
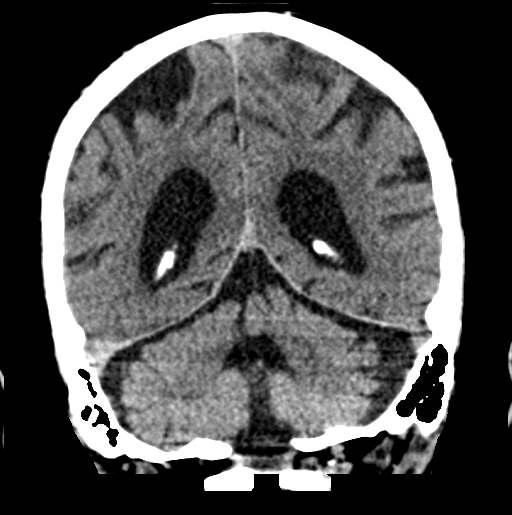
[im 34/76  brain]
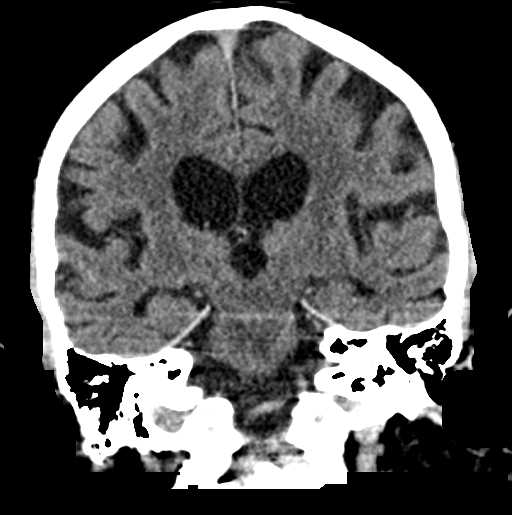
[im 42/76  brain]
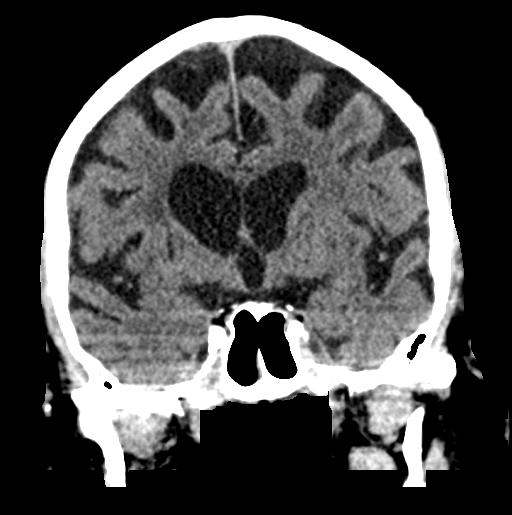

[Series 5: sagittal soft tissue · sagittal · 0.32mm/px · 3 of 67 slices shown]
[im 23/67  brain]
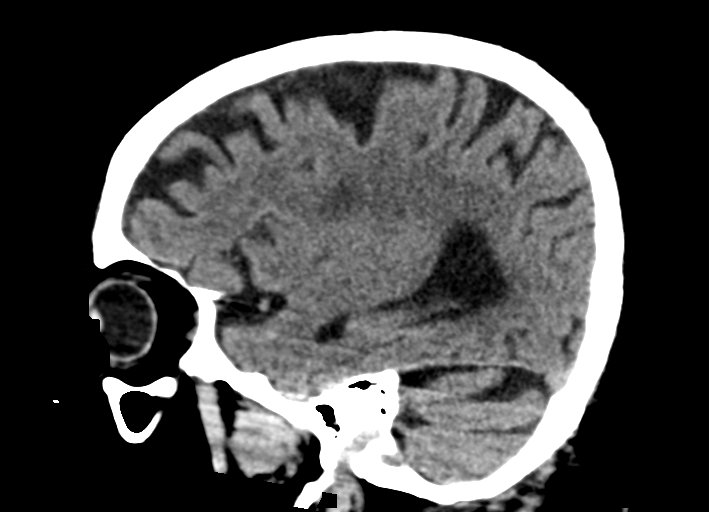
[im 34/67  brain]
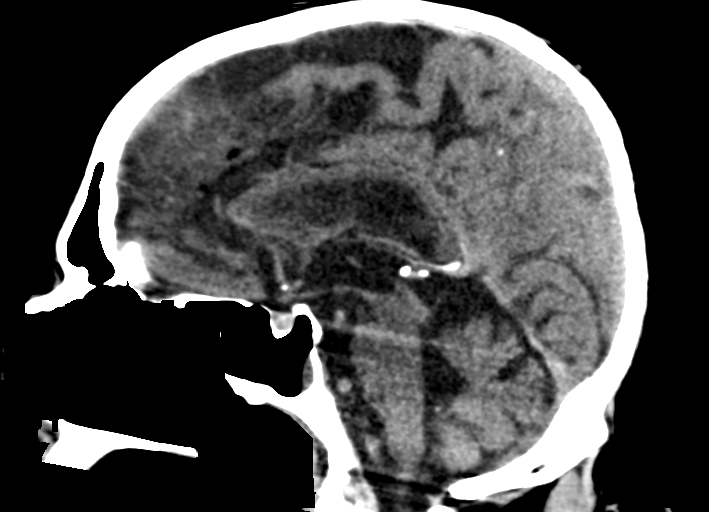
[im 45/67  brain]
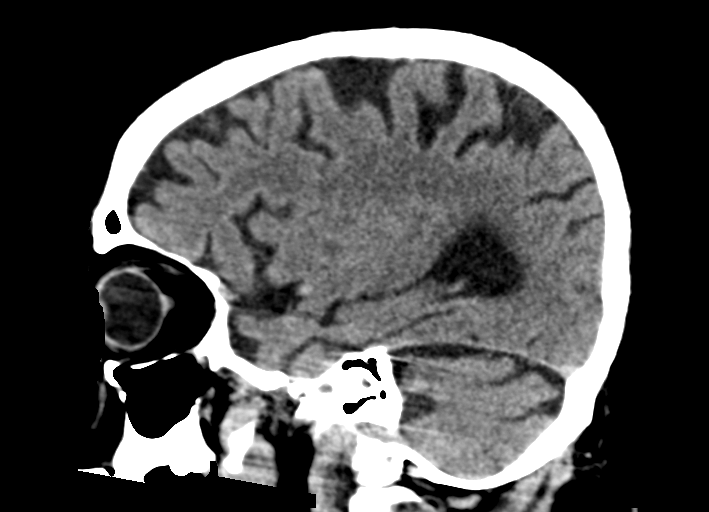

[16 of 47 positions shown; findings below may reference images not displayed]

FINDINGS: Brain: There is no acute intracranial hemorrhage, mass effect, or
edema. Gray-white differentiation is preserved. There is no
extra-axial fluid collection. Prominence of the ventricles and sulci
reflects stable generalized cerebral and cerebellar volume loss.
Patchy areas of low-attenuation the supratentorial white matter are
nonspecific but probably reflects stable chronic microvascular
ischemic changes. There are chronic small vessel infarcts of the
basal ganglia bilaterally.

Vascular: There is atherosclerotic calcification at the skull base.

Skull: Calvarium is unremarkable.

Sinuses/Orbits: No acute finding.

Other: None.
IMPRESSION: No acute intracranial abnormality or significant change since recent
prior study.

## 2020-09-16 MED ORDER — LIDOCAINE 5 % EX PTCH
1.0000 | MEDICATED_PATCH | CUTANEOUS | Status: DC
  Administered 2020-09-16 – 2020-09-24 (×9): 1 via TRANSDERMAL
  Filled 2020-09-16 (×9): qty 1

## 2020-09-16 MED ORDER — ALBUTEROL SULFATE (2.5 MG/3ML) 0.083% IN NEBU
2.5000 mg | INHALATION_SOLUTION | Freq: Once | RESPIRATORY_TRACT | Status: AC
  Administered 2020-09-16: 2.5 mg via RESPIRATORY_TRACT
  Filled 2020-09-16: qty 3

## 2020-09-16 MED ORDER — CHLORHEXIDINE GLUCONATE 0.12 % MT SOLN: 15.0000 mL | Freq: Two times a day (BID) | OROMUCOSAL | Status: DC

## 2020-09-16 MED ORDER — LORAZEPAM 2 MG/ML IJ SOLN: 1.0000 mg | Freq: Once | INTRAMUSCULAR | Status: DC

## 2020-09-16 MED ORDER — SODIUM CHLORIDE 0.9 % IV BOLUS
500.0000 mL | Freq: Once | INTRAVENOUS | Status: AC
  Administered 2020-09-16: 500 mL via INTRAVENOUS

## 2020-09-16 MED ORDER — DIPHENHYDRAMINE-ZINC ACETATE 2-0.1 % EX CREA
TOPICAL_CREAM | Freq: Two times a day (BID) | CUTANEOUS | Status: DC | PRN
  Administered 2020-09-17: 1 via TOPICAL
  Filled 2020-09-16: qty 28

## 2020-09-16 NOTE — Progress Notes (Signed)
Patient seen and evaluated at bedside. Multiple calls throughout the night. The first concern was that the patient was obtunded. He had been receiving valium throughout the day, so night dose was held. VBG was done was was reassuring. CXR showed atelectasis. Later in the night he started to have dips in his O2 sats down to 86%. O2 was increased, without much change. This is partially because patient is mouth breathing. He had an order for Bipap form June 5th, but was not able to be on BiPAP tonight because of level of alertness. When I saw patient he opened his eyes to voice, and tried to answer questions, but couldn't stay awake, or speak loud enough for any meaningful communication. He was slumped over to the right, and his breath sounds were diminished on the right. This is after an albuterol treatment earlier in the night. RN will try NRB, since patient is mouth breathing. Another VBG and another CXR have been ordered. Fluids held at this time, as RN reported congested breath sounds, although by the time of my exam congested breath sounds seem to have improved. EKG ordered, and trop as well. Patient currently satting at 90% on 12L salter [place]. HR normal. Will continue to monitor.

## 2020-09-16 NOTE — Progress Notes (Signed)
0400 pt desatting into 80's on 2 liters [place]. Oxygen increased to 6 liters with no improvement. Pt remains with decrease responsiveness though improved slightly from earlier in the shift. Pt with weak congested cough and oral suction performed. MD on call notified and RT called to bedside. Bipap considered however pt's poor responsiveness makes that contraindicated at this time. Orders given for breathing tx, xray, labwork and to d/c'd fluid. RT placed pt on 12 liters salter [place]. Will continue to monitor.

## 2020-09-16 NOTE — Progress Notes (Signed)
Progress Note  Patient Name: Jason Hartman Date of Encounter: 09/16/2020  [facility] Cardiologist: None  [place] cards  Subjective   No cardiac symptoms   Inpatient Medications    Scheduled Meds:  amiodarone  200 mg Oral Daily   apixaban  5 mg Oral BID   insulin aspart  0-9 Units Subcutaneous Q4H   insulin glargine  5 Units Subcutaneous Daily   irbesartan  300 mg Oral Daily   mouth rinse  15 mL Mouth Rinse BID   metoprolol succinate  100 mg Oral Daily   Rotigotine  1 patch Transdermal QPM   thiamine  100 mg Oral Daily   Continuous Infusions:  sodium chloride     sodium chloride     ampicillin-sulbactam (UNASYN) IV Stopped (09/16/20 0229)   PRN Meds: [CANCELED] Place/Maintain arterial line **AND** sodium chloride, acetaminophen **OR** acetaminophen, diphenhydrAMINE-zinc acetate, hydrALAZINE, HYDROmorphone (DILAUDID) injection, naLOXone (NARCAN)  injection, ofloxacin, ondansetron **OR** ondansetron (ZOFRAN) IV, senna-docusate, technetium albumin aggregated   Vital Signs    Vitals:   09/16/20 0509 09/16/20 0520 09/16/20 0527 09/16/20 0600  BP:   (!) 101/51 118/63  Pulse:  96 94 (!) 103  Resp:  (!) 40 (!) 43 (!) 31  Temp:      TempSrc:      SpO2:  92% 90% 91%  Weight: 81.7 kg     Height:        Intake/Output Summary (Last 24 hours) at 09/16/2020 0758 Last data filed at 09/16/2020 0508 Gross per 24 hour  Intake 1737.63 ml  Output 1500 ml  Net 237.63 ml   Last 3 Weights 09/16/2020 09/15/2020 09/14/2020  Weight (lbs) 180 lb 1.9 oz 180 lb 8.9 oz 185 lb 13.6 oz  Weight (kg) 81.7 kg 81.9 kg 84.3 kg      Telemetry    NSR 09/16/2020   ECG    No new - Personally Reviewed  Physical Exam   GEN: No acute distress.  Though pain with urinary cath.  Neck: No JVD Cardiac: RRR, no murmurs, rubs, or gallops.  Respiratory: Clear to auscultation bilaterally. GI: Soft, nontender, non-distended  MS: No edema; No deformity. Neuro:  Nonfocal  Psych: Normal affect    Labs    High Sensitivity Troponin:   Recent Labs  Lab 09/08/20 2308 09/09/20 0400 09/10/20 1108 09/10/20 1242 09/16/20 0239  TROPONINIHS 28* 32* 388* 300* 22*      Chemistry Recent Labs  Lab 09/11/20 0325 09/12/20 0038 09/13/20 0511 09/14/20 0254 09/15/20 0300 09/16/20 0239  NA 140 132* 138 137 135 141  K 3.5 3.9 4.4 4.2 4.0 4.2  CL 106 102 108 107 101 107  CO2 25 23 22 25 22 23   GLUCOSE 103* 164* 177* 178* 224* 213*  BUN 49* 42* 30* 21 21 42*  CREATININE 1.62* 1.53* 1.27* 1.00 1.05 1.34*  CALCIUM 8.8* 8.2* 8.7* 8.8* 9.2 9.1  PROT 6.2* 5.2* 5.4*  --   --   --   ALBUMIN 3.7 2.9* 3.0*  --   --   --   AST 109* 49* 34  --   --   --   ALT 48* 38 35  --   --   --   ALKPHOS 50 38 46  --   --   --   BILITOT 1.6* 2.0* 0.9  --   --   --   GFRNONAA 42* 45* 56* >60 >60 53*  ANIONGAP 9 7 8 5 12  11  Hematology Recent Labs  Lab 09/14/20 0254 09/15/20 0300 09/16/20 0239  WBC 8.9 15.5* 15.6*  RBC 5.16 6.23* 6.15*  HGB 14.7 17.8* 17.6*  HCT 47.4 55.7* 56.4*  MCV 91.9 89.4 91.7  MCH 28.5 28.6 28.6  MCHC 31.0 32.0 31.2  RDW 14.4 15.0 14.7  PLT 125* 159 191    BNP Recent Labs  Lab 09/10/20 1108  BNP 95.6     DDimer  Recent Labs  Lab 09/09/20 0830  DDIMER 1.87*     Radiology    CT ABDOMEN PELVIS WO CONTRAST  Result Date: 09/14/2020 CLINICAL DATA:  Acute non localized abdominal pain. EXAM: CT ABDOMEN AND PELVIS WITHOUT CONTRAST TECHNIQUE: Multidetector CT imaging of the abdomen and pelvis was performed following the standard protocol without IV contrast. COMPARISON:  Ultrasound 5 days ago.  CT abdomen 02/19/2020. FINDINGS: Lower chest: Mild atelectasis at the lung bases which obscures the small nodules discussed on the previous exam. No pleural effusion. Hepatobiliary: No liver abnormality seen without contrast. No calcified gallstones. Pancreas: Normal Spleen: Normal Adrenals/Urinary Tract: Adrenal glands are normal. Right kidney shows a few punctate  nonobstructing calculi. Renal cysts are present as seen previously, the largest in the lateral midportion measuring 3.7 cm in diameter. There is hydroureteronephrosis with the ureter dilated all the way to the bladder. No obstructing lesion seen at the UVJ. Left kidney shows a few small cysts as seen previously. Fullness of the renal collecting system in ureter on this side as well all the way to the bladder, without obstructing lesion. The bladder is distended. No obstructing lesion evident within the prostate or proximal penile urethra. Stomach/Bowel: Stomach is normal. Small intestine is normal. Colon is normal. Vascular/Lymphatic: Aortic atherosclerosis. No aneurysm. IVC is normal. No retroperitoneal adenopathy. Reproductive: Negative Other: No free fluid or air. Musculoskeletal: Chronic lower lumbar degenerative changes. IMPRESSION: Distended bladder. This could be neurogenic or due to relative outlet obstruction. Backflow dilatation of the renal collecting systems and ureters without evidence of obstructing lesion. Chronic renal cysts. Two nonobstructing renal calculi on the right. Atelectasis at both lung bases. Cannot evaluate the small peripheral densities described in the left lower lobe on the study last November. Aortic atherosclerosis. Electronically Signed   By: Nelson Chimes M.D.   On: 09/14/2020 19:33   DG CHEST PORT 1 VIEW  Result Date: 09/15/2020 CLINICAL DATA:  Tachypnea EXAM: PORTABLE CHEST 1 VIEW COMPARISON:  09/10/2020 FINDINGS: Low lung volumes, right base atelectasis. Heart is normal size. Aortic atherosclerosis. No effusions or pneumothorax. No acute bony abnormality. IMPRESSION: Low lung volumes.  Right base atelectasis. Electronically Signed   By: Rolm Baptise M.D.   On: 09/15/2020 22:06    Cardiac Studies   Echo 09/09/2020  1. Left ventricular ejection fraction, by estimation, is 55 to 60%. The  left ventricle has normal function. The left ventricle has no regional  wall motion  abnormalities. There is mild left ventricular hypertrophy.  Left ventricular diastolic parameters  are consistent with Grade I diastolic dysfunction (impaired relaxation).   2. Right ventricular systolic function is normal. The right ventricular  size is normal. Tricuspid regurgitation signal is inadequate for assessing  PA pressure.   3. Left atrial size was mildly dilated.   4. The mitral valve is grossly normal. Trivial mitral valve  regurgitation.   5. The aortic valve is tricuspid. There is mild calcification of the  aortic valve. Aortic valve regurgitation is trivial. Mild aortic valve  sclerosis is present, with no evidence of aortic  valve stenosis.    Patient Profile     85 y.o. male with PMH of OSA, parkinson's disease, CAD with stents in 2013, CKD, and IDDM who presented with anxiety and diaphoresis after stopping Xanax. He was noted to be in sinus tach with PVCs on arrival. Echo showed normal EF. He went into atrial flutter during this admission.    Assessment & Plan    1. Atrial flutter with RVR--Now in SR to ST with PACs             - Echo shows normal EF             - continue oral amiodarone beta blocker and eliquis    2. Elevated troponin: suspect demand ischemia in the setting of acute illness and tachycardia along with mild rhabdomyolysis. Troponin now back to normal 22    3. Acute on chronic renal insufficiency: renal function back to baseline, Cr 1.05 this morning.   4. Confusion: CT of head, no acute abnormality. acute metabolic encephalopathy per Neuro and IM.   5.  HTN 187/81 today on metoprolol XL 50, at home on valsartan 320 mg will defer to MD to resume, Cr has improved.   6.  Mild thrombocytopenia plts 1now normal 191 this am    7. UTI, not present on admit, on ABx  WBC 15.5,  iv antibiotics d/c baldder decompression    8.  Hx CAD with stents placed 2013, ( 2 Xience stents in RCA and one in OM) hx of statin intolerance.  9.   DM-2/anxiety/parkinson's/OSA followed by IM       Cardiology status and rhythm appear stable will sign off   For questions or updates, please contact [place] Please consult [email or web address] for contact info under        Signed, Jenkins Rouge, MD  09/16/2020, 7:58 AM

## 2020-09-16 NOTE — Evaluation (Signed)
Clinical/Bedside Swallow Evaluation Patient Details  Name: Jason Hartman MRN: [id number] Date of Birth: 10/01/1935  Today's Date: 09/16/2020 Time: SLP Start Time (ACUTE ONLY): 1520 SLP Stop Time (ACUTE ONLY): 5462 SLP Time Calculation (min) (ACUTE ONLY): 11 min  Past Medical History:  Past Medical History:  Diagnosis Date   Cellulitis and abscess of leg, except foot    Hypertension    Obstructive sleep apnea (adult) (pediatric)    Pain in joint, pelvic region and thigh    Pneumonia, organism unspecified(486)    Restless legs syndrome (RLS)    RLS (restless legs syndrome) 09/01/2012   Thoracic or lumbosacral neuritis or radiculitis, unspecified    Type II or unspecified type diabetes mellitus without mention of complication, not stated as uncontrolled    Unspecified disease of pericardium    Unspecified hereditary and idiopathic peripheral neuropathy    Past Surgical History:  Past Surgical History:  Procedure Laterality Date   ANAL FISSURE REPAIR     pericarditis  2009   HPI:  85 y.o. male with medical history significant for OSA, Parkinson's, restless leg syndrome, anxiety, chronic pain, chronic kidney disease, and insulin-dependent diabetes mellitus, who presented to the emergency department for evaluation of anxiety and diaphoresis.  Patient has had a lot of medication changes recently including discontinuation of alprazolam and initiation of tramadol Celexa BuSpar.  During the course of this hospital stay has become very encephalopathic, etiology unclear.   Assessment / Plan / Recommendation Clinical Impression  Patient is not safe to resume a po diet at this time. Patient is lethargic, arousable but falls back to sleep quickly, with decreased rotary mastication and oral holding of limited boluses provided (ice chips) followed by delayed cough suggestive of decreased airway protection. Mentation will need to improve before additional po trials are provided. Will plan to f/u in  am. SLP Visit Diagnosis: Dysphagia, unspecified (R13.10)    Aspiration Risk  Risk for inadequate nutrition/hydration;Severe aspiration risk    Diet Recommendation NPO   Medication Administration: Via alternative means    Other  Recommendations Oral Care Recommendations: Oral care QID   Follow up Recommendations  (TBD)      Frequency and Duration min 2x/week  2 weeks       Prognosis Prognosis for Safe Diet Advancement: Good Barriers to Reach Goals: Cognitive deficits;Other (Comment) (mentation)      Swallow Study   General HPI: 85 y.o. male with medical history significant for OSA, Parkinson's, restless leg syndrome, anxiety, chronic pain, chronic kidney disease, and insulin-dependent diabetes mellitus, who presented to the emergency department for evaluation of anxiety and diaphoresis.  Patient has had a lot of medication changes recently including discontinuation of alprazolam and initiation of tramadol Celexa BuSpar.  During the course of this hospital stay has become very encephalopathic, etiology unclear. Type of Study: Bedside Swallow Evaluation Previous Swallow Assessment: previous bedside eval complete this admission recommended a regular diet, thin liquid. Diet Prior to this Study: NPO Temperature Spikes Noted: No Respiratory Status: Nasal cannula (HFNC) History of Recent Intubation: No Behavior/Cognition: Lethargic/Drowsy Oral Cavity Assessment: Dry Oral Care Completed by SLP: Yes Oral Cavity - Dentition: Adequate natural dentition Vision: Functional for self-feeding Self-Feeding Abilities: Needs assist Patient Positioning: Upright in bed Baseline Vocal Quality: Breathy;Low vocal intensity Volitional Cough: Congested;Weak Volitional Swallow: Unable to elicit    Oral/Motor/Sensory Function Overall Oral Motor/Sensory Function: Generalized oral weakness   Ice Chips Ice chips: Impaired Presentation: Spoon Oral Phase Impairments: Reduced labial seal;Impaired  mastication Oral Phase  Functional Implications: Oral holding Pharyngeal Phase Impairments: Cough - Delayed   Thin Liquid Thin Liquid: Not tested    Nectar Thick Nectar Thick Liquid: Not tested   Honey Thick Honey Thick Liquid: Not tested   Puree Puree: Not tested   Solid     Solid: Not tested     Gabriel Rainwater MA, CCC-SLP  Marithza Malachi Meryl 09/16/2020,3:40 PM

## 2020-09-16 NOTE — Progress Notes (Signed)
SLP Cancellation Note  Patient Details Name: SIMEON VERA MRN: [id number] DOB: 1935/04/20   Cancelled treatment:       Reason Eval/Treat Not Completed: Patient at procedure or test/unavailable  Gabriel Rainwater MA, CCC-SLP  Paublo Warshawsky Meryl 09/16/2020, 2:58 PM

## 2020-09-16 NOTE — Progress Notes (Signed)
[facility] PROGRESS NOTE   Jason Hartman DHR:[id number] DOB: [birth year] DOA: 09/08/2020  PCP: [name], MD  Brief History/Interval Summary: 85 y.o. male with medical history significant for OSA, Parkinson's, restless leg syndrome, anxiety, chronic pain, chronic kidney disease, and insulin-dependent diabetes mellitus, who presented to the emergency department for evaluation of anxiety and diaphoresis.  Patient has had a lot of medication changes recently including discontinuation of alprazolam and initiation of tramadol Celexa BuSpar.  During the course of this hospital stay has become very encephalopathic.  Neurology was consulted.  He had to be moved to the stepdown unit.    Reason for Visit: Acute metabolic encephalopathy  Consultants:  Neurology Pulmonology Cardiology Urology  Procedures:   EEG did not show any epileptiform or seizure activity  Antibiotics: Anti-infectives (From admission, onward)    Start     Dose/Rate Route Frequency Ordered Stop   09/15/20 1000  Ampicillin-Sulbactam (UNASYN) 3 g in sodium chloride 0.9 % 100 mL IVPB        3 g 200 mL/hr over 30 Minutes Intravenous Every 8 hours 09/15/20 0821     09/13/20 0000  cefTRIAXone (ROCEPHIN) 1 g in sodium chloride 0.9 % 100 mL IVPB  Status:  Discontinued        1 g 200 mL/hr over 30 Minutes Intravenous Every 24 hours 09/12/20 2339 09/15/20 0809   09/09/20 0115  aztreonam (AZACTAM) 1 g in sodium chloride 0.9 % 100 mL IVPB        1 g 200 mL/hr over 30 Minutes Intravenous  Once 09/09/20 0106 09/09/20 0307       Subjective/Interval History: Overnight events noted.  Patient had not noted to be lethargic early this morning and noted to be hypoxic.  He was placed on nonrebreather because he was noted to be breathing through his mouth primarily.  He is arousable but goes right back to sleep.     Assessment/Plan:  Acute metabolic encephalopathy Etiology is unclear.  Differential diagnosis has been broad  including polypharmacy, withdrawal, serotonin syndrome which has been less likely.  UTI could also be contributing. LP was considered however it appears that his wife did not want him to undergo this test.  Thought to be less likely so LP was not pursued. CT head shows chronic changes without any acute findings. EEG did not show any epileptiform activity. TSH 1.09 with free T4 of 1.3. Cortisol level 35.5.  I68 was normal.  Folic acid ammonia levels were normal. UA was noted to be abnormal.  Patient was complaining of dysuria and had suprapubic tenderness.  Started on antibiotics. Patient was requiring Precedex infusion which has been discontinued.   Neurology has signed off.   Patient subsequently found to have significantly distended urinary bladder which was likely reason for his pain and agitation.  Agitation subsided once a Foley catheter was placed by urology on 6/10. His lethargy this morning is likely due to medications.  We will stop the Valium.  Continue to watch closely.  No focal neurological deficits appreciated.  Urinary bladder distention with possible bladder outlet obstruction CT scan raised concern for distention of the urinary bladder.  This is concerning for some form of bladder outlet obstruction.  Likely reason for his suprapubic tenderness and pain issues. Attempts to place Foley by nursing staff were unsuccessful.  Urology was subsequently consulted.  After using multiple dilators of catheter was placed successfully. Discussed with wife.  Patient follows with a urologist in [facility] and has a  known history of urethral strictures and sees a urologist multiple times during the year and undergoes cystoscopy every so often.  Urinary tract infection with Enterococcus UA was noted to be abnormal.  Patient was placed on ceftriaxone.  She is grew greater than 100,000 colonies of Enterococcus and less than 100,000 of Acinetobacter. Antibiotics changed over to Unasyn.    Essential  hypertension/transient hypotension Initially hypotensive requiring Levophed.  Subsequently hypertensive with systolics greater than 295 likely due to retention of urine, bladder distention and pain.  Blood pressure has been better controlled the last 24 hours.  Continue to monitor.  He remains on metoprolol and ARB.  Mild rhabdomyolysis CK level improved from 2578 to 493.    Atrial flutter, new diagnosis Echocardiogram showed normal EF.  Cardiac status appears to be stable on amiodarone and metoprolol.  He is on apixaban although poor oral intake is noted.  If there is no improvement in the next 24 hours may have to use Lovenox.  Staph hominis bacteremia Thought to be contaminant.  Repeat cultures were negative.  Obstructive sleep apnea CPAP nightly.  Elevated D-dimer Lower extremity Doppler studies negative.  VQ scan low probability.  History of parkinsonism's Noted to be on Rotigotine patch.  History of anxiety Recently stopped Xanax and benzodiazepine and so withdrawal was considered.  Apparently has been taking high doses of gabapentin which is currently on hold.  Will resume at a lower dose once he is more awake and alert.  Valium is discontinued due to lethargy.  History of restless leg syndrome Gabapentin is on hold as discussed above.  Acute kidney injury on chronic kidney disease stage IIIa Electrolytes are stable.  Monitor urine output.  Insulin-dependent diabetes mellitus HbA1c was 6.5 in May.  CBGs noted to be elevated most likely due to D5 infusion.  Basal insulin was initiated.  Continue SSI.  Obesity Estimated body mass index is 30.92 kg/m as calculated from the following:   Height as of this encounter: 5\' 4"  (1.626 m).   Weight as of this encounter: 81.7 kg.  Goals of care Long discussion with patient's wife regarding his tenuous status this morning.  Based on his previous wishes she would like for him to remain full code for now.  She will discuss with other  family members.  DVT Prophylaxis: Apixaban Code Status: Full code Family Communication: Wife updated today.   Disposition Plan: May need to go to rehab.  PT and OT evaluation.  Status is: Inpatient  Remains inpatient appropriate because:Altered mental status and IV treatments appropriate due to intensity of illness or inability to take PO   Dispo: The patient is from: Home              Anticipated d/c is to: SNF              Patient currently is not medically stable to d/c.   Difficult to place patient No       Medications:  Scheduled:  amiodarone  200 mg Oral Daily   apixaban  5 mg Oral BID   insulin aspart  0-9 Units Subcutaneous Q4H   insulin glargine  5 Units Subcutaneous Daily   irbesartan  300 mg Oral Daily   mouth rinse  15 mL Mouth Rinse BID   metoprolol succinate  100 mg Oral Daily   Rotigotine  1 patch Transdermal QPM   thiamine  100 mg Oral Daily   Continuous:  sodium chloride     sodium chloride  ampicillin-sulbactam (UNASYN) IV Stopped (09/16/20 0229)   PRN:[CANCELED] Place/Maintain arterial line **AND** sodium chloride, acetaminophen **OR** acetaminophen, diphenhydrAMINE-zinc acetate, hydrALAZINE, HYDROmorphone (DILAUDID) injection, naLOXone (NARCAN)  injection, ofloxacin, ondansetron **OR** ondansetron (ZOFRAN) IV, senna-docusate, technetium albumin aggregated   Objective:  Vital Signs  Vitals:   09/16/20 0520 09/16/20 0527 09/16/20 0600 09/16/20 0800  BP:  (!) 101/51 118/63   Pulse: 96 94 (!) 103   Resp: (!) 40 (!) 43 (!) 31   Temp:    97.7 F (36.5 C)  TempSrc:    Axillary  SpO2: 92% 90% 91%   Weight:      Height:        Intake/Output Summary (Last 24 hours) at 09/16/2020 0900 Last data filed at 09/16/2020 0508 Gross per 24 hour  Intake 1737.63 ml  Output 1500 ml  Net 237.63 ml    Filed Weights   09/14/20 0443 09/15/20 0500 09/16/20 0509  Weight: 84.3 kg 81.9 kg 81.7 kg    General appearance: Lethargic.  Arousable but goes  right back to sleep Resp: Noted to be tachypneic with no use of accessory muscles.  Diminished air entry at the bases.  Few crackles but no wheezing or rhonchi. Cardio: S1-S2 is normal regular.  No S3-S4.  No rubs murmurs or bruit GI: Abdomen is soft.  Less tender in the suprapubic area.  No masses organomegaly.  Bowel sounds present.   Extremities: No edema.  Noted to be moving his extremities Neurologic:  No focal neurological deficits.     Lab Results:  Data Reviewed: I have personally reviewed following labs and imaging studies  CBC: Recent Labs  Lab 09/11/20 0325 09/12/20 0038 09/13/20 0511 09/14/20 0254 09/15/20 0300 09/16/20 0239  WBC 16.8* 13.6* 9.0 8.9 15.5* 15.6*  NEUTROABS 13.9* 11.5*  --   --   --   --   HGB 17.4* 15.1 14.6 14.7 17.8* 17.6*  HCT 54.4* 48.9 47.0 47.4 55.7* 56.4*  MCV 89.3 92.6 91.4 91.9 89.4 91.7  PLT 167 152 111* 125* 159 191     Basic Metabolic Panel: Recent Labs  Lab 09/11/20 0325 09/12/20 0038 09/13/20 0511 09/14/20 0254 09/15/20 0300 09/16/20 0239  NA 140 132* 138 137 135 141  K 3.5 3.9 4.4 4.2 4.0 4.2  CL 106 102 108 107 101 107  CO2 25 23 22 25 22 23   GLUCOSE 103* 164* 177* 178* 224* 213*  BUN 49* 42* 30* 21 21 42*  CREATININE 1.62* 1.53* 1.27* 1.00 1.05 1.34*  CALCIUM 8.8* 8.2* 8.7* 8.8* 9.2 9.1  MG 2.1 1.9  --   --   --   --   PHOS 3.9 3.0  --   --   --   --      GFR: Estimated Creatinine Clearance: 41 mL/min (A) (by C-G formula based on SCr of 1.34 mg/dL (H)).  Liver Function Tests: Recent Labs  Lab 09/10/20 0926 09/11/20 0325 09/12/20 0038 09/13/20 0511  AST 64* 109* 49* 34  ALT 37 48* 38 35  ALKPHOS 46 50 38 46  BILITOT 1.8* 1.6* 2.0* 0.9  PROT 5.8* 6.2* 5.2* 5.4*  ALBUMIN 3.4* 3.7 2.9* 3.0*      Recent Labs  Lab 09/10/20 1108  AMMONIA 23     Coagulation Profile: Recent Labs  Lab 09/10/20 1108  INR 1.2     Cardiac Enzymes: Recent Labs  Lab 09/10/20 1108 09/11/20 0910 09/12/20 0038   CKTOTAL 2,578* 1,227* 493*      CBG: Recent  Labs  Lab 09/15/20 1551 09/15/20 1932 09/16/20 0010 09/16/20 0447 09/16/20 0730  GLUCAP 287* 242* 200* 228* 204*       Recent Results (from the past 240 hour(s))  Urine culture     Status: Abnormal   Collection Time: 09/08/20 11:35 PM   Specimen: Urine, Clean Catch  Result Value Ref Range Status   Specimen Description   Final    URINE, CLEAN CATCH Performed at [facility], [place] [address], [place], [place] [address]    Special Requests   Final    NONE Performed at [facility], [place] [address] [place], [place] [address]    Culture (A)  Final    <10,000 COLONIES/mL INSIGNIFICANT GROWTH Performed at [place] [address], [place], [place] [address]    Report Status 09/10/2020 FINAL  Final  Resp Panel by RT-PCR (Flu A&B, Covid) Nasopharyngeal Swab     Status: None   Collection Time: 09/08/20 11:41 PM   Specimen: Nasopharyngeal Swab; Nasopharyngeal(NP) swabs in vial transport medium  Result Value Ref Range Status   SARS Coronavirus 2 by RT PCR NEGATIVE NEGATIVE Final    Comment: (NOTE) SARS-CoV-2 target nucleic acids are NOT DETECTED.  The SARS-CoV-2 RNA is generally detectable in upper respiratory specimens during the acute phase of infection. The lowest concentration of SARS-CoV-2 viral copies this assay can detect is 138 copies/mL. A negative result does not preclude SARS-Cov-2 infection and should not be used as the sole basis for treatment or other patient management decisions. A negative result may occur with  improper specimen collection/handling, submission of specimen other than nasopharyngeal swab, presence of viral mutation(s) within the areas targeted by this assay, and inadequate number of viral copies(<138 copies/mL). A negative result must be combined with clinical observations, patient history, and epidemiological information. The expected result is  Negative.  Fact Sheet for Patients:  EntrepreneurPulse.com.au  Fact Sheet for Healthcare Providers:  IncredibleEmployment.be  This test is no t yet approved or cleared by the [place] FDA and  has been authorized for detection and/or diagnosis of SARS-CoV-2 by FDA under an Emergency Use Authorization (EUA). This EUA will remain  in effect (meaning this test can be used) for the duration of the COVID-19 declaration under Section 564(b)(1) of the Act, 21 U.S.C.section 360bbb-3(b)(1), unless the authorization is terminated  or revoked sooner.       Influenza A by PCR NEGATIVE NEGATIVE Final   Influenza B by PCR NEGATIVE NEGATIVE Final    Comment: (NOTE) The Xpert Xpress SARS-CoV-2/FLU/RSV plus assay is intended as an aid in the diagnosis of influenza from Nasopharyngeal swab specimens and should not be used as a sole basis for treatment. Nasal washings and aspirates are unacceptable for Xpert Xpress SARS-CoV-2/FLU/RSV testing.  Fact Sheet for Patients: EntrepreneurPulse.com.au  Fact Sheet for Healthcare Providers: IncredibleEmployment.be  This test is not yet approved or cleared by the [place] FDA and has been authorized for detection and/or diagnosis of SARS-CoV-2 by FDA under an Emergency Use Authorization (EUA). This EUA will remain in effect (meaning this test can be used) for the duration of the COVID-19 declaration under Section 564(b)(1) of the Act, 21 U.S.C. section 360bbb-3(b)(1), unless the authorization is terminated or revoked.  Performed at [facility], [place] [address], [place],  [address]   Blood culture (routine x 2)     Status: Abnormal   Collection Time: 09/09/20  1:03 AM   Specimen: BLOOD  Result Value  Ref Range Status   Specimen Description   Final    BLOOD RIGHT WRIST Performed at [place] [address]  [place], [place] [address]    Special Requests   Final    BOTTLES DRAWN AEROBIC AND ANAEROBIC Blood Culture adequate volume Performed at [place] [address] [place], [place] [address]    Culture  Setup Time   Final    GRAM POSITIVE COCCI IN CLUSTERS IN BOTH AEROBIC AND ANAEROBIC BOTTLES CRITICAL RESULT CALLED TO, READ BACK BY AND VERIFIED WITH: PHARMD MICHELLE LILLISTAN 09/09/2020 AT 2220 A.HUGHES    Culture (A)  Final    STAPHYLOCOCCUS HOMINIS UNABLE TO ISOLATE S.EPIDERMIDIS THE SIGNIFICANCE OF ISOLATING THIS ORGANISM FROM A SINGLE SET OF BLOOD CULTURES WHEN MULTIPLE SETS ARE DRAWN IS UNCERTAIN. PLEASE NOTIFY THE MICROBIOLOGY DEPARTMENT WITHIN ONE WEEK IF SPECIATION AND SENSITIVITIES ARE REQUIRED. Performed at [facility], [place] [address], [place], [place] [address]    Report Status 09/13/2020 FINAL  Final  Blood Culture ID Panel (Reflexed)     Status: Abnormal   Collection Time: 09/09/20  1:03 AM  Result Value Ref Range Status   Enterococcus faecalis NOT DETECTED NOT DETECTED Final   Enterococcus Faecium NOT DETECTED NOT DETECTED Final   Listeria monocytogenes NOT DETECTED NOT DETECTED Final   Staphylococcus species DETECTED (A) NOT DETECTED Final    Comment: CRITICAL RESULT CALLED TO, READ BACK BY AND VERIFIED WITH: PHARMD MICHELLE LILLISTAN 09/09/2020 AT 2220 A.HUGHES    Staphylococcus aureus (BCID) NOT DETECTED NOT DETECTED Final   Staphylococcus epidermidis DETECTED (A) NOT DETECTED Final    Comment: Methicillin (oxacillin) resistant coagulase negative staphylococcus. Possible blood culture contaminant (unless isolated from more than one blood culture draw or clinical case suggests pathogenicity). No antibiotic treatment is indicated for blood  culture contaminants. CRITICAL RESULT CALLED TO, READ BACK BY AND VERIFIED WITH: PHARMD MICHELLE LILLISTAN 09/09/2020 AT 2220 A.HUGHES    Staphylococcus lugdunensis NOT DETECTED NOT DETECTED Final    Streptococcus species NOT DETECTED NOT DETECTED Final   Streptococcus agalactiae NOT DETECTED NOT DETECTED Final   Streptococcus pneumoniae NOT DETECTED NOT DETECTED Final   Streptococcus pyogenes NOT DETECTED NOT DETECTED Final   A.calcoaceticus-baumannii NOT DETECTED NOT DETECTED Final   Bacteroides fragilis NOT DETECTED NOT DETECTED Final   Enterobacterales NOT DETECTED NOT DETECTED Final   Enterobacter cloacae complex NOT DETECTED NOT DETECTED Final   Escherichia coli NOT DETECTED NOT DETECTED Final   Klebsiella aerogenes NOT DETECTED NOT DETECTED Final   Klebsiella oxytoca NOT DETECTED NOT DETECTED Final   Klebsiella pneumoniae NOT DETECTED NOT DETECTED Final   Proteus species NOT DETECTED NOT DETECTED Final   Salmonella species NOT DETECTED NOT DETECTED Final   Serratia marcescens NOT DETECTED NOT DETECTED Final   Haemophilus influenzae NOT DETECTED NOT DETECTED Final   Neisseria meningitidis NOT DETECTED NOT DETECTED Final   Pseudomonas aeruginosa NOT DETECTED NOT DETECTED Final   Stenotrophomonas maltophilia NOT DETECTED NOT DETECTED Final   Candida albicans NOT DETECTED NOT DETECTED Final   Candida auris NOT DETECTED NOT DETECTED Final   Candida glabrata NOT DETECTED NOT DETECTED Final   Candida krusei NOT DETECTED NOT DETECTED Final   Candida parapsilosis NOT DETECTED NOT DETECTED Final   Candida tropicalis NOT DETECTED NOT DETECTED Final   Cryptococcus neoformans/gattii NOT DETECTED NOT DETECTED Final   Methicillin resistance mecA/C DETECTED (A) NOT DETECTED Final    Comment: CRITICAL RESULT CALLED TO, READ BACK BY AND  VERIFIED WITH: PHARMD MICHELLE LILLISTAN 09/09/2020 AT 2220 A.HUGHES Performed at [facility], [place] [address], [place], [place] [address]   Blood culture (routine x 2)     Status: None   Collection Time: 09/09/20  1:08 AM   Specimen: BLOOD  Result Value Ref Range Status   Specimen Description   Final    BLOOD LEFT WRIST Performed at [place] [address] [place], [place] [address]    Special Requests   Final    BOTTLES DRAWN AEROBIC AND ANAEROBIC Blood Culture adequate volume Performed at [place] [address] [place], [place] [address]    Culture   Final    NO GROWTH 5 DAYS Performed at [facility], [place] [address] [place], [place] [address]    Report Status 09/14/2020 FINAL  Final  MRSA PCR Screening     Status: Abnormal   Collection Time: 09/10/20  5:19 AM   Specimen: Nasal Mucosa; Nasopharyngeal  Result Value Ref Range Status   MRSA by PCR POSITIVE (A) NEGATIVE Final    Comment:        The GeneXpert MRSA Assay (FDA approved for NASAL specimens only), is one component of a comprehensive MRSA colonization surveillance program. It is not intended to diagnose MRSA infection nor to guide or monitor treatment for MRSA infections. RESULT CALLED TO, READ BACK BY AND VERIFIED WITH: HEAVNER,A. RN AT 1150 09/10/20 MULLINS,T Performed at [facility], [place] [address] [place], [place] [address]   Culture, blood (routine x 2)     Status: None   Collection Time: 09/10/20 11:03 AM   Specimen: BLOOD  Result Value Ref Range Status   Specimen Description   Final    BLOOD LEFT FOREARM Performed at [place] [address], [place], [place] [address]    Special Requests   Final    BOTTLES DRAWN AEROBIC ONLY Blood Culture results may not be optimal due to an inadequate volume of blood received in culture bottles Performed at [place] [address], [place], [place] [address]    Culture   Final    NO GROWTH 5 DAYS Performed at [facility], [place] [address] [place], [place] [address]    Report Status 09/15/2020 FINAL  Final  Culture, blood (routine x 2)     Status: None   Collection Time: 09/10/20 11:03 AM   Specimen: BLOOD  Result Value Ref Range Status   Specimen Description   Final    BLOOD  RIGHT ANTECUBITAL Performed at [place] [address], [place], [place] [address]    Special Requests   Final    BOTTLES DRAWN AEROBIC AND ANAEROBIC Blood Culture adequate volume Performed at [place] [address], [place], [place] [address]    Culture   Final    NO GROWTH 5 DAYS Performed at [facility], [place] [address], [place], [place] [address]    Report Status 09/15/2020 FINAL  Final  Culture, Urine     Status: Abnormal   Collection Time: 09/12/20 11:39 PM   Specimen: Urine, Clean Catch  Result Value Ref Range Status   Specimen Description   Final    URINE, CLEAN CATCH Performed at [facility], [place] [address] [place], [place] [address]    Special Requests   Final    NONE Performed at [facility], [address] [place], [place] [address]    Culture (A)  Final    >=100,000 COLONIES/mL ENTEROCOCCUS FAECALIS 10,000 COLONIES/mL ACINETOBACTER CALCOACETICUS/BAUMANNII COMPLEX    Report Status 09/15/2020 FINAL  Final   Organism ID, Bacteria ENTEROCOCCUS FAECALIS (A)  Final   Organism ID, Bacteria ACINETOBACTER CALCOACETICUS/BAUMANNII COMPLEX (A)  Final      Susceptibility   Acinetobacter calcoaceticus/baumannii complex - MIC*    CEFTAZIDIME 4 SENSITIVE Sensitive     CIPROFLOXACIN <=0.25 SENSITIVE Sensitive     GENTAMICIN <=1 SENSITIVE Sensitive     IMIPENEM <=0.25 SENSITIVE Sensitive     PIP/TAZO 8 SENSITIVE Sensitive     TRIMETH/SULFA <=20 SENSITIVE Sensitive     AMPICILLIN/SULBACTAM <=2 SENSITIVE Sensitive     * 10,000 COLONIES/mL ACINETOBACTER CALCOACETICUS/BAUMANNII COMPLEX   Enterococcus faecalis - MIC*    AMPICILLIN <=2 SENSITIVE Sensitive     NITROFURANTOIN <=16 SENSITIVE Sensitive     VANCOMYCIN 1 SENSITIVE Sensitive     * >=100,000 COLONIES/mL ENTEROCOCCUS FAECALIS      Radiology Studies: CT ABDOMEN PELVIS WO CONTRAST  Result Date: 09/14/2020 CLINICAL DATA:   Acute non localized abdominal pain. EXAM: CT ABDOMEN AND PELVIS WITHOUT CONTRAST TECHNIQUE: Multidetector CT imaging of the abdomen and pelvis was performed following the standard protocol without IV contrast. COMPARISON:  Ultrasound 5 days ago.  CT abdomen 02/19/2020. FINDINGS: Lower chest: Mild atelectasis at the lung bases which obscures the small nodules discussed on the previous exam. No pleural effusion. Hepatobiliary: No liver abnormality seen without contrast. No calcified gallstones. Pancreas: Normal Spleen: Normal Adrenals/Urinary Tract: Adrenal glands are normal. Right kidney shows a few punctate nonobstructing calculi. Renal cysts are present as seen previously, the largest in the lateral midportion measuring 3.7 cm in diameter. There is hydroureteronephrosis with the ureter dilated all the way to the bladder. No obstructing lesion seen at the UVJ. Left kidney shows a few small cysts as seen previously. Fullness of the renal collecting system in ureter on this side as well all the way to the bladder, without obstructing lesion. The bladder is distended. No obstructing lesion evident within the prostate or proximal penile urethra. Stomach/Bowel: Stomach is normal. Small intestine is normal. Colon is normal. Vascular/Lymphatic: Aortic atherosclerosis. No aneurysm. IVC is normal. No retroperitoneal adenopathy. Reproductive: Negative Other: No free fluid or air. Musculoskeletal: Chronic lower lumbar degenerative changes. IMPRESSION: Distended bladder. This could be neurogenic or due to relative outlet obstruction. Backflow dilatation of the renal collecting systems and ureters without evidence of obstructing lesion. Chronic renal cysts. Two nonobstructing renal calculi on the right. Atelectasis at both lung bases. Cannot evaluate the small peripheral densities described in the left lower lobe on the study last November. Aortic atherosclerosis. Electronically Signed   By: Nelson Chimes M.D.   On: 09/14/2020  19:33   DG CHEST PORT 1 VIEW  Result Date: 09/15/2020 CLINICAL DATA:  Tachypnea EXAM: PORTABLE CHEST 1 VIEW COMPARISON:  09/10/2020 FINDINGS: Low lung volumes, right base atelectasis. Heart is normal size. Aortic atherosclerosis. No effusions or pneumothorax. No acute bony abnormality. IMPRESSION: Low lung volumes.  Right base atelectasis. Electronically Signed   By: Rolm Baptise M.D.   On: 09/15/2020 22:06        LOS: 6 days   [facility] Pager on [email or web address]  09/16/2020, 9:00 AM

## 2020-09-17 LAB — GLUCOSE, CAPILLARY
Glucose-Capillary: 173 mg/dL — ABNORMAL HIGH (ref 70–99)
Glucose-Capillary: 181 mg/dL — ABNORMAL HIGH (ref 70–99)
Glucose-Capillary: 182 mg/dL — ABNORMAL HIGH (ref 70–99)
Glucose-Capillary: 196 mg/dL — ABNORMAL HIGH (ref 70–99)
Glucose-Capillary: 216 mg/dL — ABNORMAL HIGH (ref 70–99)
Glucose-Capillary: 219 mg/dL — ABNORMAL HIGH (ref 70–99)

## 2020-09-17 LAB — BASIC METABOLIC PANEL
Anion gap: 14 (ref 5–15)
BUN: 70 mg/dL — ABNORMAL HIGH (ref 8–23)
CO2: 24 mmol/L (ref 22–32)
Calcium: 9 mg/dL (ref 8.9–10.3)
Chloride: 108 mmol/L (ref 98–111)
Creatinine, Ser: 1.91 mg/dL — ABNORMAL HIGH (ref 0.61–1.24)
GFR, Estimated: 35 mL/min — ABNORMAL LOW (ref >60)
Glucose, Bld: 209 mg/dL — ABNORMAL HIGH (ref 70–99)
Potassium: 4.5 mmol/L (ref 3.5–5.1)
Sodium: 146 mmol/L — ABNORMAL HIGH (ref 135–145)

## 2020-09-17 MED ORDER — ENOXAPARIN SODIUM 80 MG/0.8ML IJ SOSY
1.0000 mg/kg | PREFILLED_SYRINGE | Freq: Every day | INTRAMUSCULAR | Status: DC
  Administered 2020-09-17: 80 mg via SUBCUTANEOUS
  Filled 2020-09-17: qty 0.8

## 2020-09-17 MED ORDER — SODIUM CHLORIDE 0.45 % IV SOLN: INTRAVENOUS | Status: DC

## 2020-09-17 MED ORDER — METOPROLOL TARTRATE 5 MG/5ML IV SOLN
2.5000 mg | Freq: Four times a day (QID) | INTRAVENOUS | Status: DC
  Administered 2020-09-17 – 2020-09-22 (×20): 2.5 mg via INTRAVENOUS
  Filled 2020-09-17 (×20): qty 5

## 2020-09-17 NOTE — Progress Notes (Signed)
SLP Cancellation Note  Patient Details Name: Jason Hartman MRN: [id number] DOB: 1936-03-06   Cancelled treatment:       Reason Eval/Treat Not Completed: Fatigue/lethargy limiting ability to participate  Gabriel Rainwater MA, [place] 09/17/2020, 7:55 AM

## 2020-09-17 NOTE — Progress Notes (Signed)
[facility] PROGRESS NOTE   Jason Hartman QPY:[id number] DOB: 03-23-1936 DOA: 09/08/2020  PCP: [name], MD  Brief History/Interval Summary: 85 y.o. male with medical history significant for OSA, Parkinson's, restless leg syndrome, anxiety, chronic pain, chronic kidney disease, and insulin-dependent diabetes mellitus, who presented to the emergency department for evaluation of anxiety and diaphoresis.  Patient has had a lot of medication changes recently including discontinuation of alprazolam and initiation of tramadol Celexa BuSpar.  During the course of this hospital stay has become very encephalopathic.  Neurology was consulted.  He had to be moved to the stepdown unit.    Reason for Visit: Acute metabolic encephalopathy  Consultants:  Neurology Pulmonology Cardiology Urology  Procedures:   EEG did not show any epileptiform or seizure activity  Antibiotics: Anti-infectives (From admission, onward)    Start     Dose/Rate Route Frequency Ordered Stop   09/15/20 1000  Ampicillin-Sulbactam (UNASYN) 3 g in sodium chloride 0.9 % 100 mL IVPB        3 g 200 mL/hr over 30 Minutes Intravenous Every 8 hours 09/15/20 0821     09/13/20 0000  cefTRIAXone (ROCEPHIN) 1 g in sodium chloride 0.9 % 100 mL IVPB  Status:  Discontinued        1 g 200 mL/hr over 30 Minutes Intravenous Every 24 hours 09/12/20 2339 09/15/20 0809   09/09/20 0115  aztreonam (AZACTAM) 1 g in sodium chloride 0.9 % 100 mL IVPB        1 g 200 mL/hr over 30 Minutes Intravenous  Once 09/09/20 0106 09/09/20 0307       Subjective/Interval History: Patient had an episode of agitation overnight.  Noted to be lethargic and sleepy this morning.  Opens his eyes to voice command.  Discussed with nursing staff.     Assessment/Plan:  Acute metabolic encephalopathy Etiology was unclear.  Differential diagnosis has been broad including polypharmacy, withdrawal, serotonin syndrome which has been less likely.  UTI  could also be contributing. LP was considered however it appears that his wife did not want him to undergo this test.  Meningitis was thought to be less likely so LP was not pursued. CT head shows chronic changes without any acute findings. EEG did not show any epileptiform activity. TSH 1.09 with free T4 of 1.3. Cortisol level 35.5.  T24 was normal.  Folic acid ammonia levels were normal. UA was noted to be abnormal.  Patient was complaining of dysuria and had suprapubic tenderness.  Started on antibiotics. Patient was requiring Precedex infusion which has been discontinued.   Neurology has signed off.   Patient subsequently found to have significantly distended urinary bladder which was likely reason for his pain and agitation.  Agitation subsided once a Foley catheter was placed by urology on 6/10. Patient remains lethargic.  CT head was repeated which did not show any acute findings.  No focal deficits noted.  Continue to monitor.  Avoid sedative agents as much as possible.  Urinary bladder distention with possible bladder outlet obstruction CT scan raised concern for distention of the urinary bladder.  This is concerning for some form of bladder outlet obstruction.  Likely reason for his suprapubic tenderness and pain issues. Attempts to place Foley by nursing staff were unsuccessful.  Urology was subsequently consulted.  After using multiple dilators of catheter was placed successfully. Discussed with wife.  Patient follows with a urologist in [facility] and has a known history of urethral strictures and sees a urologist multiple  times during the year and undergoes cystoscopy every so often. Seems to be stable.  Abdomen is less tender.  Transient hypoxia When he was lethargic a couple of days ago he was noted to be hypoxic.  Required oxygen for about 36 hours.  This morning he is off of oxygen saturating in the early 90s.  Chest x-ray only showed atelectasis.  Urinary tract infection with  Enterococcus UA was noted to be abnormal.  Patient was placed on ceftriaxone.  She is grew greater than 100,000 colonies of Enterococcus and less than 100,000 of Acinetobacter. Antibiotics changed over to Unasyn.    Essential hypertension/transient hypotension Initially hypotensive requiring Levophed.  Subsequently hypertensive with systolics greater than 017 likely due to retention of urine, bladder distention and pain.   After placement of Foley catheter blood pressures have improved.   Yesterday he had a drop in his blood pressure.  We discontinued his ARB.  Continue to monitor.    Mild rhabdomyolysis CK level improved from 2578 to 493.    Atrial flutter, new diagnosis Echocardiogram showed normal EF.  Cardiac status appears to be stable on amiodarone and metoprolol.   Remains high risk for aspiration and so he is not getting any of his oral agents.  We will switch him to Lovenox for now.  Once he is able to take orally apixaban can be resumed.   We will also place him on scheduled IV metoprolol.  Staph hominis bacteremia Thought to be contaminant.  Repeat cultures were negative.  Obstructive sleep apnea CPAP nightly.  Elevated D-dimer Lower extremity Doppler studies negative.  VQ scan low probability.  History of parkinsonism's Noted to be on Rotigotine patch.  History of anxiety Recently stopped Xanax and benzodiazepine and so withdrawal was considered.  Apparently has been taking high doses of gabapentin which is currently on hold.  Will resume at a lower dose once he is more awake and alert.  Valium was discontinued due to lethargy.  History of restless leg syndrome Gabapentin is on hold as discussed above.  Acute kidney injury on chronic kidney disease stage IIIa/hypernatremia Worsening renal function noted today with higher creatinine.  Likely due to poor oral intake.  Will initiate IV fluids.  Hypernatremia also noted.  Insulin-dependent diabetes mellitus HbA1c was  6.5 in May.  CBGs noted to be elevated most likely due to D5 infusion.  Basal insulin was initiated.  Continue SSI.  Obesity Estimated body mass index is 30.46 kg/m as calculated from the following:   Height as of this encounter: 5\' 4"  (1.626 m).   Weight as of this encounter: 80.5 kg.  Goals of care Family has been updated.  Discussion was held with patient's wife as well as his brother who is a Engineer, drilling in [place].  Continue full CODE STATUS for now.    DVT Prophylaxis: Apixaban Code Status: Full code Family Communication: We will update wife later today Disposition Plan: May need to go to rehab.  PT and OT evaluation.  Status is: Inpatient  Remains inpatient appropriate because:Altered mental status and IV treatments appropriate due to intensity of illness or inability to take PO   Dispo: The patient is from: Home              Anticipated d/c is to: SNF              Patient currently is not medically stable to d/c.   Difficult to place patient No       Medications:  Scheduled:  amiodarone  200 mg Oral Daily   apixaban  5 mg Oral BID   insulin aspart  0-9 Units Subcutaneous Q4H   insulin glargine  5 Units Subcutaneous Daily   lidocaine  1 patch Transdermal Q24H   mouth rinse  15 mL Mouth Rinse BID   metoprolol succinate  100 mg Oral Daily   Rotigotine  1 patch Transdermal QPM   thiamine  100 mg Oral Daily   Continuous:  sodium chloride 75 mL/hr at 09/17/20 0740   ampicillin-sulbactam (UNASYN) IV Stopped (09/17/20 0240)   TMH:[id number] **OR** acetaminophen, diphenhydrAMINE-zinc acetate, hydrALAZINE, naLOXone (NARCAN)  injection, ofloxacin, ondansetron **OR** ondansetron (ZOFRAN) IV, senna-docusate, technetium albumin aggregated   Objective:  Vital Signs  Vitals:   09/17/20 0400 09/17/20 0420 09/17/20 0612 09/17/20 0700  BP:   117/65 (!) 117/55  Pulse: 99 (!) 101 93 95  Resp: (!) 29 (!) 35 (!) 29 (!) 32  Temp: 98.5 F (36.9 C)     TempSrc: Oral      SpO2: 97% 93% 97% 90%  Weight:  80.5 kg    Height:        Intake/Output Summary (Last 24 hours) at 09/17/2020 0848 Last data filed at 09/17/2020 0740 Gross per 24 hour  Intake 429.45 ml  Output 925 ml  Net -495.55 ml    Filed Weights   09/15/20 0500 09/16/20 0509 09/17/20 0420  Weight: 81.9 kg 81.7 kg 80.5 kg    General appearance: Noted to be lethargic but easily arousable.  Confused. Resp: Diminished air entry at the bases.  No wheezing or rhonchi. Cardio: S1-S2 is normal regular.  No S3-S4.  No rubs murmurs or bruit GI: Abdomen is soft.  Nontender nondistended.  Bowel sounds are present normal.  No masses organomegaly Extremities: No edema.  Noted to be moving his extremities Neurologic:   No focal neurological deficits.      Lab Results:  Data Reviewed: I have personally reviewed following labs and imaging studies  CBC: Recent Labs  Lab 09/11/20 0325 09/12/20 0038 09/13/20 0511 09/14/20 0254 09/15/20 0300 09/16/20 0239  WBC 16.8* 13.6* 9.0 8.9 15.5* 15.6*  NEUTROABS 13.9* 11.5*  --   --   --   --   HGB 17.4* 15.1 14.6 14.7 17.8* 17.6*  HCT 54.4* 48.9 47.0 47.4 55.7* 56.4*  MCV 89.3 92.6 91.4 91.9 89.4 91.7  PLT 167 152 111* 125* 159 191     Basic Metabolic Panel: Recent Labs  Lab 09/11/20 0325 09/12/20 0038 09/13/20 0511 09/14/20 0254 09/15/20 0300 09/16/20 0239 09/17/20 0321  NA 140 132* 138 137 135 141 146*  K 3.5 3.9 4.4 4.2 4.0 4.2 4.5  CL 106 102 108 107 101 107 108  CO2 25 23 22 25 22 23 24   GLUCOSE 103* 164* 177* 178* 224* 213* 209*  BUN 49* 42* 30* 21 21 42* 70*  CREATININE 1.62* 1.53* 1.27* 1.00 1.05 1.34* 1.91*  CALCIUM 8.8* 8.2* 8.7* 8.8* 9.2 9.1 9.0  MG 2.1 1.9  --   --   --   --   --   PHOS 3.9 3.0  --   --   --   --   --      GFR: Estimated Creatinine Clearance: 28.6 mL/min (A) (by C-G formula based on SCr of 1.91 mg/dL (H)).  Liver Function Tests: Recent Labs  Lab 09/10/20 0926 09/11/20 0325 09/12/20 0038  09/13/20 0511  AST 64* 109* 49* 34  ALT 37 48* 38 35  ALKPHOS 46 50 38 46  BILITOT 1.8* 1.6* 2.0* 0.9  PROT 5.8* 6.2* 5.2* 5.4*  ALBUMIN 3.4* 3.7 2.9* 3.0*      Recent Labs  Lab 09/10/20 1108  AMMONIA 23     Coagulation Profile: Recent Labs  Lab 09/10/20 1108  INR 1.2     Cardiac Enzymes: Recent Labs  Lab 09/10/20 1108 09/11/20 0910 09/12/20 0038  CKTOTAL 2,578* 1,227* 493*      CBG: Recent Labs  Lab 09/16/20 1554 09/16/20 1920 09/16/20 2355 09/17/20 0427 09/17/20 0728  GLUCAP 224* 257* 213* 216* 173*       Recent Results (from the past 240 hour(s))  Urine culture     Status: Abnormal   Collection Time: 09/08/20 11:35 PM   Specimen: Urine, Clean Catch  Result Value Ref Range Status   Specimen Description   Final    URINE, CLEAN CATCH Performed at [facility], [place] [address] [place], [place] [address]    Special Requests   Final    NONE Performed at [facility], [place] [address] [place], [place] [address]    Culture (A)  Final    <10,000 COLONIES/mL INSIGNIFICANT GROWTH Performed at [facility] [address] [place], [place] [address]    Report Status 09/10/2020 FINAL  Final  Resp Panel by RT-PCR (Flu A&B, Covid) Nasopharyngeal Swab     Status: None   Collection Time: 09/08/20 11:41 PM   Specimen: Nasopharyngeal Swab; Nasopharyngeal(NP) swabs in vial transport medium  Result Value Ref Range Status   SARS Coronavirus 2 by RT PCR NEGATIVE NEGATIVE Final    Comment: (NOTE) SARS-CoV-2 target nucleic acids are NOT DETECTED.  The SARS-CoV-2 RNA is generally detectable in upper respiratory specimens during the acute phase of infection. The lowest concentration of SARS-CoV-2 viral copies this assay can detect is 138 copies/mL. A negative result does not preclude SARS-Cov-2 infection and should not be used as the sole basis for treatment or other patient management decisions. A negative result  may occur with  improper specimen collection/handling, submission of specimen other than nasopharyngeal swab, presence of viral mutation(s) within the areas targeted by this assay, and inadequate number of viral copies(<138 copies/mL). A negative result must be combined with clinical observations, patient history, and epidemiological information. The expected result is Negative.  Fact Sheet for Patients:  EntrepreneurPulse.com.au  Fact Sheet for Healthcare Providers:  IncredibleEmployment.be  This test is no t yet approved or cleared by the [place] FDA and  has been authorized for detection and/or diagnosis of SARS-CoV-2 by FDA under an Emergency Use Authorization (EUA). This EUA will remain  in effect (meaning this test can be used) for the duration of the COVID-19 declaration under Section 564(b)(1) of the Act, 21 U.S.C.section 360bbb-3(b)(1), unless the authorization is terminated  or revoked sooner.       Influenza A by PCR NEGATIVE NEGATIVE Final   Influenza B by PCR NEGATIVE NEGATIVE Final    Comment: (NOTE) The Xpert Xpress SARS-CoV-2/FLU/RSV plus assay is intended as an aid in the diagnosis of influenza from Nasopharyngeal swab specimens and should not be used as a sole basis for treatment. Nasal washings and aspirates are unacceptable for Xpert Xpress SARS-CoV-2/FLU/RSV testing.  Fact Sheet for Patients: EntrepreneurPulse.com.au  Fact Sheet for Healthcare Providers: IncredibleEmployment.be  This test is not yet approved or cleared by the [place] FDA and has been authorized for detection and/or diagnosis of SARS-CoV-2 by FDA under an Emergency Use  Authorization (EUA). This EUA will remain in effect (meaning this test can be used) for the duration of the COVID-19 declaration under Section 564(b)(1) of the Act, 21 U.S.C. section 360bbb-3(b)(1), unless the authorization is terminated  or revoked.  Performed at [facility], [place] [address] [place], [place] [address]   Blood culture (routine x 2)     Status: Abnormal   Collection Time: 09/09/20  1:03 AM   Specimen: BLOOD  Result Value Ref Range Status   Specimen Description   Final    BLOOD RIGHT WRIST Performed at [facility] [address], [place], [place] [address]    Special Requests   Final    BOTTLES DRAWN AEROBIC AND ANAEROBIC Blood Culture adequate volume Performed at [facility] [address] [place], [place] [address]    Culture  Setup Time   Final    GRAM POSITIVE COCCI IN CLUSTERS IN BOTH AEROBIC AND ANAEROBIC BOTTLES CRITICAL RESULT CALLED TO, READ BACK BY AND VERIFIED WITH: PHARMD MICHELLE LILLISTAN 09/09/2020 AT 2220 A.HUGHES    Culture (A)  Final    STAPHYLOCOCCUS HOMINIS UNABLE TO ISOLATE S.EPIDERMIDIS THE SIGNIFICANCE OF ISOLATING THIS ORGANISM FROM A SINGLE SET OF BLOOD CULTURES WHEN MULTIPLE SETS ARE DRAWN IS UNCERTAIN. PLEASE NOTIFY THE MICROBIOLOGY DEPARTMENT WITHIN ONE WEEK IF SPECIATION AND SENSITIVITIES ARE REQUIRED. Performed at [facility], [place] [address] [place], [place] [address]    Report Status 09/13/2020 FINAL  Final  Blood Culture ID Panel (Reflexed)     Status: Abnormal   Collection Time: 09/09/20  1:03 AM  Result Value Ref Range Status   Enterococcus faecalis NOT DETECTED NOT DETECTED Final   Enterococcus Faecium NOT DETECTED NOT DETECTED Final   Listeria monocytogenes NOT DETECTED NOT DETECTED Final   Staphylococcus species DETECTED (A) NOT DETECTED Final    Comment: CRITICAL RESULT CALLED TO, READ BACK BY AND VERIFIED WITH: PHARMD MICHELLE LILLISTAN 09/09/2020 AT 2220 A.HUGHES    Staphylococcus aureus (BCID) NOT DETECTED NOT DETECTED Final   Staphylococcus epidermidis DETECTED (A) NOT DETECTED Final    Comment: Methicillin (oxacillin) resistant coagulase negative staphylococcus. Possible blood  culture contaminant (unless isolated from more than one blood culture draw or clinical case suggests pathogenicity). No antibiotic treatment is indicated for blood  culture contaminants. CRITICAL RESULT CALLED TO, READ BACK BY AND VERIFIED WITH: PHARMD MICHELLE LILLISTAN 09/09/2020 AT 2220 A.HUGHES    Staphylococcus lugdunensis NOT DETECTED NOT DETECTED Final   Streptococcus species NOT DETECTED NOT DETECTED Final   Streptococcus agalactiae NOT DETECTED NOT DETECTED Final   Streptococcus pneumoniae NOT DETECTED NOT DETECTED Final   Streptococcus pyogenes NOT DETECTED NOT DETECTED Final   A.calcoaceticus-baumannii NOT DETECTED NOT DETECTED Final   Bacteroides fragilis NOT DETECTED NOT DETECTED Final   Enterobacterales NOT DETECTED NOT DETECTED Final   Enterobacter cloacae complex NOT DETECTED NOT DETECTED Final   Escherichia coli NOT DETECTED NOT DETECTED Final   Klebsiella aerogenes NOT DETECTED NOT DETECTED Final   Klebsiella oxytoca NOT DETECTED NOT DETECTED Final   Klebsiella pneumoniae NOT DETECTED NOT DETECTED Final   Proteus species NOT DETECTED NOT DETECTED Final   Salmonella species NOT DETECTED NOT DETECTED Final   Serratia marcescens NOT DETECTED NOT DETECTED Final   Haemophilus influenzae NOT DETECTED NOT DETECTED Final   Neisseria meningitidis NOT DETECTED NOT DETECTED Final   Pseudomonas aeruginosa NOT DETECTED NOT DETECTED Final   Stenotrophomonas maltophilia NOT DETECTED NOT DETECTED Final   Candida albicans NOT  DETECTED NOT DETECTED Final   Candida auris NOT DETECTED NOT DETECTED Final   Candida glabrata NOT DETECTED NOT DETECTED Final   Candida krusei NOT DETECTED NOT DETECTED Final   Candida parapsilosis NOT DETECTED NOT DETECTED Final   Candida tropicalis NOT DETECTED NOT DETECTED Final   Cryptococcus neoformans/gattii NOT DETECTED NOT DETECTED Final   Methicillin resistance mecA/C DETECTED (A) NOT DETECTED Final    Comment: CRITICAL RESULT CALLED TO, READ BACK BY  AND VERIFIED WITH: PHARMD MICHELLE LILLISTAN 09/09/2020 AT 2220 A.HUGHES Performed at [facility], [place] [address] [place], [place] [address]   Blood culture (routine x 2)     Status: None   Collection Time: 09/09/20  1:08 AM   Specimen: BLOOD  Result Value Ref Range Status   Specimen Description   Final    BLOOD LEFT WRIST Performed at [place] [address] [place], [place] [address]    Special Requests   Final    BOTTLES DRAWN AEROBIC AND ANAEROBIC Blood Culture adequate volume Performed at [facility] [address], [place], [place] [address]    Culture   Final    NO GROWTH 5 DAYS Performed at [facility], [place] [address] [place], [place] [address]    Report Status 09/14/2020 FINAL  Final  MRSA PCR Screening     Status: Abnormal   Collection Time: 09/10/20  5:19 AM   Specimen: Nasal Mucosa; Nasopharyngeal  Result Value Ref Range Status   MRSA by PCR POSITIVE (A) NEGATIVE Final    Comment:        The GeneXpert MRSA Assay (FDA approved for NASAL specimens only), is one component of a comprehensive MRSA colonization surveillance program. It is not intended to diagnose MRSA infection nor to guide or monitor treatment for MRSA infections. RESULT CALLED TO, READ BACK BY AND VERIFIED WITH: HEAVNER,A. RN AT 1150 09/10/20 MULLINS,T Performed at [facility], [place] [address] [place], [place] [address]   Culture, blood (routine x 2)     Status: None   Collection Time: 09/10/20 11:03 AM   Specimen: BLOOD  Result Value Ref Range Status   Specimen Description   Final    BLOOD LEFT FOREARM Performed at [facility] [address] [place], [place] [address]    Special Requests   Final    BOTTLES DRAWN AEROBIC ONLY Blood Culture results may not be optimal due to an inadequate volume of blood received in culture bottles Performed at [facility] [address] [place], [place] [address]    Culture   Final    NO GROWTH 5 DAYS Performed at [facility], [place] [address] [place], [place] [address]    Report Status 09/15/2020 FINAL  Final  Culture, blood (routine x 2)     Status: None   Collection Time: 09/10/20 11:03 AM   Specimen: BLOOD  Result Value Ref Range Status   Specimen Description   Final    BLOOD RIGHT ANTECUBITAL Performed at [place] [address], [place], [place] [address]    Special Requests   Final    BOTTLES DRAWN AEROBIC AND ANAEROBIC Blood Culture adequate volume Performed at [place] [address] [place], [place] [address]    Culture   Final    NO GROWTH 5 DAYS Performed at [facility], [place] [address], [place],  [address]  Report Status 09/15/2020 FINAL  Final  Culture, Urine     Status: Abnormal   Collection Time: 09/12/20 11:39 PM   Specimen: Urine, Clean Catch  Result Value Ref Range Status   Specimen Description   Final    URINE, CLEAN CATCH Performed at [facility], [place] [address] [place], [place] [address]    Special Requests   Final    NONE Performed at [facility], [place] [address], [place], [place] [address]    Culture (A)  Final    >=100,000 COLONIES/mL ENTEROCOCCUS FAECALIS 10,000 COLONIES/mL ACINETOBACTER CALCOACETICUS/BAUMANNII COMPLEX    Report Status 09/15/2020 FINAL  Final   Organism ID, Bacteria ENTEROCOCCUS FAECALIS (A)  Final   Organism ID, Bacteria ACINETOBACTER CALCOACETICUS/BAUMANNII COMPLEX (A)  Final      Susceptibility   Acinetobacter calcoaceticus/baumannii complex - MIC*    CEFTAZIDIME 4 SENSITIVE Sensitive     CIPROFLOXACIN <=0.25 SENSITIVE Sensitive     GENTAMICIN <=1 SENSITIVE Sensitive     IMIPENEM <=0.25 SENSITIVE Sensitive     PIP/TAZO 8 SENSITIVE Sensitive     TRIMETH/SULFA <=20 SENSITIVE Sensitive     AMPICILLIN/SULBACTAM <=2 SENSITIVE Sensitive     * 10,000  COLONIES/mL ACINETOBACTER CALCOACETICUS/BAUMANNII COMPLEX   Enterococcus faecalis - MIC*    AMPICILLIN <=2 SENSITIVE Sensitive     NITROFURANTOIN <=16 SENSITIVE Sensitive     VANCOMYCIN 1 SENSITIVE Sensitive     * >=100,000 COLONIES/mL ENTEROCOCCUS FAECALIS      Radiology Studies: CT HEAD WO CONTRAST  Result Date: 09/16/2020 CLINICAL DATA:  Mental status change EXAM: CT HEAD WITHOUT CONTRAST TECHNIQUE: Contiguous axial images were obtained from the base of the skull through the vertex without intravenous contrast. COMPARISON:  09/10/2020 FINDINGS: Brain: There is no acute intracranial hemorrhage, mass effect, or edema. Gray-white differentiation is preserved. There is no extra-axial fluid collection. Prominence of the ventricles and sulci reflects stable generalized cerebral and cerebellar volume loss. Patchy areas of low-attenuation the supratentorial white matter are nonspecific but probably reflects stable chronic microvascular ischemic changes. There are chronic small vessel infarcts of the basal ganglia bilaterally. Vascular: There is atherosclerotic calcification at the skull base. Skull: Calvarium is unremarkable. Sinuses/Orbits: No acute finding. Other: None. IMPRESSION: No acute intracranial abnormality or significant change since recent prior study. Electronically Signed   By: Macy Mis M.D.   On: 09/16/2020 15:18   DG CHEST PORT 1 VIEW  Result Date: 09/16/2020 CLINICAL DATA:  Hypoxia EXAM: PORTABLE CHEST 1 VIEW COMPARISON:  Prior chest x-ray yesterday 09/15/2020 FINDINGS: Patient is markedly rotated toward the right. This results in distortion of the cardiac and mediastinal contours. Grossly, the heart and mediastinum appear unchanged. Atherosclerotic calcifications again noted in the transverse aorta. Progressive elevation of the right hemidiaphragm with increasing linear perihilar and basilar opacities most consistent with atelectasis. The aerated portions of the lungs are otherwise  clear. No significant pulmonary vascular congestion or evidence of edema. No large effusion or pneumothorax. Left greater than right humeral joint osteoarthritis. IMPRESSION: 1. Patient is significantly rotated toward the right. 2. Increasing right perihilar and basilar atelectasis with progressive elevation of the right hemidiaphragm. 3. Otherwise, no new acute cardiopulmonary process. Electronically Signed   By: Jacqulynn Cadet M.D.   On: 09/16/2020 10:01   DG CHEST PORT 1 VIEW  Result Date: 09/15/2020 CLINICAL DATA:  Tachypnea EXAM: PORTABLE CHEST 1 VIEW COMPARISON:  09/10/2020 FINDINGS: Low lung volumes, right base atelectasis. Heart is normal size. Aortic atherosclerosis. No effusions or pneumothorax.  No acute bony abnormality. IMPRESSION: Low lung volumes.  Right base atelectasis. Electronically Signed   By: Rolm Baptise M.D.   On: 09/15/2020 22:06        LOS: 7 days   [facility] Pager on [email or web address]  09/17/2020, 8:48 AM

## 2020-09-17 NOTE — Progress Notes (Signed)
Progress Note  Patient Name: Jason Hartman Date of Encounter: 09/17/2020  [facility] Cardiologist: None  [place] cards  Subjective   No cardiac symptoms Yelling for help Delirium   Inpatient Medications    Scheduled Meds:  amiodarone  200 mg Oral Daily   apixaban  5 mg Oral BID   insulin aspart  0-9 Units Subcutaneous Q4H   insulin glargine  5 Units Subcutaneous Daily   lidocaine  1 patch Transdermal Q24H   mouth rinse  15 mL Mouth Rinse BID   metoprolol succinate  100 mg Oral Daily   Rotigotine  1 patch Transdermal QPM   thiamine  100 mg Oral Daily   Continuous Infusions:  sodium chloride 75 mL/hr at 09/17/20 0740   ampicillin-sulbactam (UNASYN) IV Stopped (09/17/20 0240)   PRN Meds: acetaminophen **OR** acetaminophen, diphenhydrAMINE-zinc acetate, hydrALAZINE, naLOXone (NARCAN)  injection, ofloxacin, ondansetron **OR** ondansetron (ZOFRAN) IV, senna-docusate, technetium albumin aggregated   Vital Signs    Vitals:   09/17/20 0400 09/17/20 0420 09/17/20 0612 09/17/20 0700  BP:   117/65 (!) 117/55  Pulse: 99 (!) 101 93 95  Resp: (!) 29 (!) 35 (!) 29 (!) 32  Temp: 98.5 F (36.9 C)     TempSrc: Oral     SpO2: 97% 93% 97% 90%  Weight:  80.5 kg    Height:        Intake/Output Summary (Last 24 hours) at 09/17/2020 0840 Last data filed at 09/17/2020 0740 Gross per 24 hour  Intake 429.45 ml  Output 925 ml  Net -495.55 ml   Last 3 Weights 09/17/2020 09/16/2020 09/15/2020  Weight (lbs) 177 lb 7.5 oz 180 lb 1.9 oz 180 lb 8.9 oz  Weight (kg) 80.5 kg 81.7 kg 81.9 kg      Telemetry    NSR 09/17/2020   ECG    No new - Personally Reviewed  Physical Exam   GEN: No acute distress.  Though pain with urinary cath.  Neck: No JVD Cardiac: RRR, no murmurs, rubs, or gallops.  Respiratory: Clear to auscultation bilaterally. GI: Soft, nontender, non-distended  MS: No edema; No deformity. Neuro:  Nonfocal  Psych: Normal affect   Labs    High Sensitivity  Troponin:   Recent Labs  Lab 09/08/20 2308 09/09/20 0400 09/10/20 1108 09/10/20 1242 09/16/20 0239  TROPONINIHS 28* 32* 388* 300* 22*      Chemistry Recent Labs  Lab 09/11/20 0325 09/12/20 0038 09/13/20 0511 09/14/20 0254 09/15/20 0300 09/16/20 0239 09/17/20 0321  NA 140 132* 138   < > 135 141 146*  K 3.5 3.9 4.4   < > 4.0 4.2 4.5  CL 106 102 108   < > 101 107 108  CO2 25 23 22    < > 22 23 24   GLUCOSE 103* 164* 177*   < > 224* 213* 209*  BUN 49* 42* 30*   < > 21 42* 70*  CREATININE 1.62* 1.53* 1.27*   < > 1.05 1.34* 1.91*  CALCIUM 8.8* 8.2* 8.7*   < > 9.2 9.1 9.0  PROT 6.2* 5.2* 5.4*  --   --   --   --   ALBUMIN 3.7 2.9* 3.0*  --   --   --   --   AST 109* 49* 34  --   --   --   --   ALT 48* 38 35  --   --   --   --   ALKPHOS 50 38 46  --   --   --   --  BILITOT 1.6* 2.0* 0.9  --   --   --   --   GFRNONAA 42* 45* 56*   < > >60 53* 35*  ANIONGAP 9 7 8    < > 12 11 14    < > = values in this interval not displayed.     Hematology Recent Labs  Lab 09/14/20 0254 09/15/20 0300 09/16/20 0239  WBC 8.9 15.5* 15.6*  RBC 5.16 6.23* 6.15*  HGB 14.7 17.8* 17.6*  HCT 47.4 55.7* 56.4*  MCV 91.9 89.4 91.7  MCH 28.5 28.6 28.6  MCHC 31.0 32.0 31.2  RDW 14.4 15.0 14.7  PLT 125* 159 191    BNP Recent Labs  Lab 09/10/20 1108  BNP 95.6     DDimer  No results for input(s): DDIMER in the last 168 hours.    Radiology    CT HEAD WO CONTRAST  Result Date: 09/16/2020 CLINICAL DATA:  Mental status change EXAM: CT HEAD WITHOUT CONTRAST TECHNIQUE: Contiguous axial images were obtained from the base of the skull through the vertex without intravenous contrast. COMPARISON:  09/10/2020 FINDINGS: Brain: There is no acute intracranial hemorrhage, mass effect, or edema. Gray-white differentiation is preserved. There is no extra-axial fluid collection. Prominence of the ventricles and sulci reflects stable generalized cerebral and cerebellar volume loss. Patchy areas of  low-attenuation the supratentorial white matter are nonspecific but probably reflects stable chronic microvascular ischemic changes. There are chronic small vessel infarcts of the basal ganglia bilaterally. Vascular: There is atherosclerotic calcification at the skull base. Skull: Calvarium is unremarkable. Sinuses/Orbits: No acute finding. Other: None. IMPRESSION: No acute intracranial abnormality or significant change since recent prior study. Electronically Signed   By: Macy Mis M.D.   On: 09/16/2020 15:18   DG CHEST PORT 1 VIEW  Result Date: 09/16/2020 CLINICAL DATA:  Hypoxia EXAM: PORTABLE CHEST 1 VIEW COMPARISON:  Prior chest x-ray yesterday 09/15/2020 FINDINGS: Patient is markedly rotated toward the right. This results in distortion of the cardiac and mediastinal contours. Grossly, the heart and mediastinum appear unchanged. Atherosclerotic calcifications again noted in the transverse aorta. Progressive elevation of the right hemidiaphragm with increasing linear perihilar and basilar opacities most consistent with atelectasis. The aerated portions of the lungs are otherwise clear. No significant pulmonary vascular congestion or evidence of edema. No large effusion or pneumothorax. Left greater than right humeral joint osteoarthritis. IMPRESSION: 1. Patient is significantly rotated toward the right. 2. Increasing right perihilar and basilar atelectasis with progressive elevation of the right hemidiaphragm. 3. Otherwise, no new acute cardiopulmonary process. Electronically Signed   By: Jacqulynn Cadet M.D.   On: 09/16/2020 10:01   DG CHEST PORT 1 VIEW  Result Date: 09/15/2020 CLINICAL DATA:  Tachypnea EXAM: PORTABLE CHEST 1 VIEW COMPARISON:  09/10/2020 FINDINGS: Low lung volumes, right base atelectasis. Heart is normal size. Aortic atherosclerosis. No effusions or pneumothorax. No acute bony abnormality. IMPRESSION: Low lung volumes.  Right base atelectasis. Electronically Signed   By: Rolm Baptise M.D.   On: 09/15/2020 22:06    Cardiac Studies   Echo 09/09/2020  1. Left ventricular ejection fraction, by estimation, is 55 to 60%. The  left ventricle has normal function. The left ventricle has no regional  wall motion abnormalities. There is mild left ventricular hypertrophy.  Left ventricular diastolic parameters  are consistent with Grade I diastolic dysfunction (impaired relaxation).   2. Right ventricular systolic function is normal. The right ventricular  size is normal. Tricuspid regurgitation signal is inadequate for assessing  PA  pressure.   3. Left atrial size was mildly dilated.   4. The mitral valve is grossly normal. Trivial mitral valve  regurgitation.   5. The aortic valve is tricuspid. There is mild calcification of the  aortic valve. Aortic valve regurgitation is trivial. Mild aortic valve  sclerosis is present, with no evidence of aortic valve stenosis.    Patient Profile     85 y.o. male with PMH of OSA, parkinson's disease, CAD with stents in 2013, CKD, and IDDM who presented with anxiety and diaphoresis after stopping Xanax. He was noted to be in sinus tach with PVCs on arrival. Echo showed normal EF. He went into atrial flutter during this admission.    Assessment & Plan    1. Atrial flutter with RVR--Now in SR               - Echo shows normal EF             - continue oral amiodarone beta blocker and eliquis    2. Elevated troponin: suspect demand ischemia in the setting of acute illness and tachycardia along with mild rhabdomyolysis. Troponin now back to normal 22    3. Acute on chronic renal insufficiency: renal function back to baseline, Cr 1.05 this morning.   4. Confusion: CT of head, no acute abnormality. acute metabolic encephalopathy per Neuro and IM.   5.  HTN :  improved on current meds    6.  Mild thrombocytopenia plts 1now normal 191    7. UTI, not present on admit, on ABx  WBC 15.5,  iv antibiotics d/c baldder decompression     8.  Hx CAD with stents placed 2013, ( 2 Xience stents in RCA and one in OM) hx of statin intolerance.  9.  DM-2/anxiety/parkinson's/OSA followed by IM       Cardiology status and rhythm appear stable will sign off   For questions or updates, please contact [place] Please consult [email or web address] for contact info under        Signed, Jenkins Rouge, MD  09/17/2020, 8:40 AM     Patient ID: Jason Hartman, male   DOB: 11/09/1935, 85 y.o.   MRN: [id number]

## 2020-09-17 NOTE — Progress Notes (Signed)
[place] for [facility] Indication: bridge therapy for Aflutter while apixaban on hold  Allergies  Allergen Reactions   Oxycodone Anaphylaxis    Other reaction(s): Other (See Comments) Cannot recall specifics   Iodinated Diagnostic Agents Hives    alleric to renografin,isovue & omnipaque, hives, requires 13 hr prep//a.calhoun, Onset Date: [id number]      Iodine Hives and Rash    alleric to renografin,isovue & omnipaque, hives, requires 13 hr prep//a.calhoun, Onset Date: [id number] allergic to renografin,isovue & omnipaque, hives, requires 13 hr prep//a.calhoun, Onset Date: [id number]   Linezolid Hives and Rash        Methadone Hcl Other (See Comments)    HALLUCINATIONS    Promethazine Other (See Comments)    Mental status change, DELIRIUM   Other reaction(s): erratic behavior Other reaction(s): Other (See Comments) Mental status change DELIRIUM (pulled out IV)   Morphine Sulfate Other (See Comments)    Pt not sure about allergy    Other Hives    Other reaction(s): erratic behanvior Other reaction(s): erratic behavior   Oxycodone Hcl Other (See Comments)    Pt not sure about allergy    Quetiapine Other (See Comments)    Pt not sure about allergy  Other reaction(s): Other (See Comments) Pt not sure about allergy  Pt not sure about allergy    Statins     Other reaction(s): muscle weakness Other reaction(s): muscle weakness   Zolpidem Other (See Comments)    Other reaction(s): erratic behanvior Other reaction(s): Delusions (intolerance) Altered mental status   Atorvastatin Itching and Other (See Comments)    Tired, weakness    Carbidopa-Levodopa Anxiety   Chlorhexidine Gluconate [Chlorhexidine] Hives and Rash   Clindamycin Rash   Colesevelam Other (See Comments)    tired    Doxazosin Rash   Lovastatin Other (See Comments)    Tired, nervousness    Rosuvastatin Other (See Comments)    Tired, weakness     Patient  Measurements: Height: 5\' 4"  (162.6 cm) Weight: 80.5 kg (177 lb 7.5 oz) IBW/kg (Calculated) : 59.2 Heparin Dosing Weight:   Vital Signs: Temp: 98.8 F (37.1 C) (06/12 0800) Temp Source: Axillary (06/12 0800) BP: 117/55 (06/12 0700) Pulse Rate: 95 (06/12 0700)  Labs: Recent Labs    09/15/20 0300 09/16/20 0239 09/17/20 0321  HGB 17.8* 17.6*  --   HCT 55.7* 56.4*  --   PLT 159 191  --   CREATININE 1.05 1.34* 1.91*  TROPONINIHS  --  22*  --     Estimated Creatinine Clearance: 28.6 mL/min (A) (by C-G formula based on SCr of 1.91 mg/dL (H)).   Medical History: Past Medical History:  Diagnosis Date   Cellulitis and abscess of leg, except foot    Hypertension    Obstructive sleep apnea (adult) (pediatric)    Pain in joint, pelvic region and thigh    Pneumonia, organism unspecified(486)    Restless legs syndrome (RLS)    RLS (restless legs syndrome) 09/01/2012   Thoracic or lumbosacral neuritis or radiculitis, unspecified    Type II or unspecified type diabetes mellitus without mention of complication, not stated as uncontrolled    Unspecified disease of pericardium    Unspecified hereditary and idiopathic peripheral neuropathy     Medications:  Medications Prior to Admission  Medication Sig Dispense Refill Last Dose   acetaminophen (TYLENOL) 500 MG tablet Take 500 mg by mouth every 6 (six) hours as needed for mild pain.   unk  aspirin 81 MG EC tablet Take 81 mg by mouth daily.   09/06/2020   busPIRone (BUSPAR) 10 MG tablet Take 10 mg by mouth 2 (two) times daily.   09/06/2020   chlorthalidone (HYGROTON) 25 MG tablet Take 12.5 mg by mouth daily.   09/06/2020   co-enzyme Q-10 50 MG capsule Take 50 mg by mouth daily.   09/06/2020   diltiazem (CARDIZEM CD) 120 MG 24 hr capsule Take 120 mg by mouth daily.   09/06/2020   DULoxetine (CYMBALTA) 20 MG capsule Take 20 mg by mouth daily.   09/06/2020   ferrous sulfate 325 (65 FE) MG EC tablet Take 325 mg by mouth daily.    09/06/2020    fluticasone (FLONASE) 50 MCG/ACT nasal spray Place 2 sprays into both nostrils daily.   09/07/2020   gabapentin (NEURONTIN) 600 MG tablet Take 1,200 mg by mouth 3 (three) times daily.   09/07/2020   HYDROcodone-acetaminophen (NORCO/VICODIN) 5-325 MG tablet Take 1 tablet by mouth at bedtime as needed for pain.   unk   LANTUS SOLOSTAR 100 UNIT/ML Solostar Pen Inject 90 Units into the skin 2 (two) times daily.   09/07/2020   metFORMIN (GLUCOPHAGE-XR) 500 MG 24 hr tablet Take 500 mg by mouth daily with breakfast.   09/06/2020   nitroGLYCERIN (NITROSTAT) 0.4 MG SL tablet Place 0.4 mg under the tongue every 5 (five) minutes as needed for chest pain.   unk   ofloxacin (OCUFLOX) 0.3 % ophthalmic solution Place 1 drop into both eyes 4 (four) times daily as needed (eye irritation).   unk   Omega-3 Fatty Acids (FISH OIL) 1000 MG CAPS Take 1,000 mg by mouth daily.   09/06/2020   ondansetron (ZOFRAN-ODT) 4 MG disintegrating tablet Take 4 mg by mouth every 8 (eight) hours as needed for nausea or vomiting.   unk   polyethylene glycol powder (GLYCOLAX/MIRALAX) 17 GM/SCOOP powder Take 17 g by mouth daily.   Past Week at Unknown time   Rotigotine (NEUPRO) 8 MG/24HR PT24 Place 1 patch onto the skin every evening.   09/07/2020   Semaglutide,0.25 or 0.5MG /DOS, (OZEMPIC, 0.25 OR 0.5 MG/DOSE,) 2 MG/1.5ML SOPN Inject 0.5 mg into the skin every Tuesday.   09/05/2020   tadalafil (CIALIS) 5 MG tablet Take 5 mg by mouth daily.   09/06/2020   traMADol (ULTRAM) 50 MG tablet Take 50 mg by mouth every 6 (six) hours as needed for pain.   09/07/2020   valsartan (DIOVAN) 320 MG tablet Take 320 mg by mouth daily.   09/06/2020   allopurinol (ZYLOPRIM) 100 MG tablet Take 2 tablets daily. (Patient not taking: No sig reported) 60 tablet 0 Not Taking at Unknown time   BD PEN NEEDLE NANO 2ND GEN 32G X 4 MM MISC FOR LANTUS AND VICTOZA PENS       Assessment: 85 yo M on apixaban 5 mg po bid started 09/13/2020 for Aflutter.  Pharmacy consulted to dose LMWH for  bridge therapy while pt NPO. SCr up to 1.91, CrCl ~ 29 ml/min.  CBC WNL.  No bleeding reported.   Goal of Therapy:  Anti-Xa level 0.6-1 units/ml 4hrs after LMWH dose given Monitor platelets by anticoagulation protocol: Yes   Plan:  Lovenox 1 mg/kg = 80 mg q24 F/u renal function> may be able to increase to q12 if CrCl > 30 ml/min Plan to resume apixaban once mental status improved and able to safely take [place], Pharm.D  9:52 AM

## 2020-09-17 NOTE — Progress Notes (Signed)
Pt beginning to become agitated. Yelling and stating "help me". Attempting to calm pt with reorientation and repositioning. Will call MD oncall at this time.

## 2020-09-17 NOTE — Progress Notes (Signed)
Updated wife Kamerin Grumbine on patient transferred to [phone]

## 2020-09-18 LAB — GLUCOSE, CAPILLARY
Glucose-Capillary: 156 mg/dL — ABNORMAL HIGH (ref 70–99)
Glucose-Capillary: 145 mg/dL — ABNORMAL HIGH (ref 70–99)
Glucose-Capillary: 233 mg/dL — ABNORMAL HIGH (ref 70–99)
Glucose-Capillary: 297 mg/dL — ABNORMAL HIGH (ref 70–99)
Glucose-Capillary: 270 mg/dL — ABNORMAL HIGH (ref 70–99)

## 2020-09-18 LAB — CBC
HCT: 55 % — ABNORMAL HIGH (ref 39.0–52.0)
Hemoglobin: 16.4 g/dL (ref 13.0–17.0)
MCH: 28.6 pg (ref 26.0–34.0)
MCHC: 29.8 g/dL — ABNORMAL LOW (ref 30.0–36.0)
MCV: 95.8 fL (ref 80.0–100.0)
Platelets: 162 10*3/uL (ref 150–400)
RBC: 5.74 MIL/uL (ref 4.22–5.81)
RDW: 14.5 % (ref 11.5–15.5)
WBC: 12 10*3/uL — ABNORMAL HIGH (ref 4.0–10.5)
nRBC: 0 % (ref 0.0–0.2)

## 2020-09-18 LAB — BASIC METABOLIC PANEL
Anion gap: 6 (ref 5–15)
BUN: 71 mg/dL — ABNORMAL HIGH (ref 8–23)
CO2: 25 mmol/L (ref 22–32)
Calcium: 8.6 mg/dL — ABNORMAL LOW (ref 8.9–10.3)
Chloride: 115 mmol/L — ABNORMAL HIGH (ref 98–111)
Creatinine, Ser: 1.36 mg/dL — ABNORMAL HIGH (ref 0.61–1.24)
GFR, Estimated: 52 mL/min — ABNORMAL LOW (ref >60)
Glucose, Bld: 159 mg/dL — ABNORMAL HIGH (ref 70–99)
Potassium: 4.1 mmol/L (ref 3.5–5.1)
Sodium: 146 mmol/L — ABNORMAL HIGH (ref 135–145)

## 2020-09-18 MED ORDER — HYDROCORTISONE 1 % EX CREA
TOPICAL_CREAM | Freq: Two times a day (BID) | CUTANEOUS | Status: DC
  Administered 2020-09-18 – 2020-09-25 (×6): 1 via TOPICAL
  Filled 2020-09-18 (×3): qty 28

## 2020-09-18 MED ORDER — DIAZEPAM 5 MG/ML IJ SOLN
5.0000 mg | Freq: Once | INTRAMUSCULAR | Status: AC | PRN
  Administered 2020-09-19: 5 mg via INTRAVENOUS
  Filled 2020-09-18: qty 2

## 2020-09-18 MED ORDER — ENOXAPARIN SODIUM 80 MG/0.8ML IJ SOSY
1.0000 mg/kg | PREFILLED_SYRINGE | Freq: Two times a day (BID) | INTRAMUSCULAR | Status: DC
  Administered 2020-09-18 – 2020-09-22 (×9): 80 mg via SUBCUTANEOUS
  Filled 2020-09-18 (×9): qty 0.8

## 2020-09-18 MED ORDER — SODIUM CHLORIDE 0.9 % IV SOLN
3.0000 g | Freq: Four times a day (QID) | INTRAVENOUS | Status: DC
  Administered 2020-09-18 – 2020-09-21 (×13): 3 g via INTRAVENOUS
  Filled 2020-09-18 (×8): qty 3
  Filled 2020-09-18: qty 8
  Filled 2020-09-18 (×5): qty 3

## 2020-09-18 NOTE — Progress Notes (Addendum)
Physical Therapy Treatment Patient Details Name: Jason Hartman MRN: [id number] DOB: 09/06/1935 Today's Date: 09/18/2020    History of Present Illness 85 yo male admitted with anxiety, afib, ams, agitation requiring sedation. Head CT negative; MRI ordered. hx of osa, parkinson's, ckd, dm    PT Comments    Pt conversational, but lethargic, appears to have difficulty rotating head R towards doorway as therapist enters. Pt able to follow commands to perform BLE strengthening exercises in supine, requiring verbal and tactile cues and assist with heel slides to prevent hip external rotation and abduction bilaterally. Pt moving BLE equally with exercises, cued for motor control and decreased momentum to improve strengthening. Pt requires assistance to maintain bil in hooklying position, difficulty reaching with BUE towards bedrails requiring moderate assistance to touch R and L bedrail. Pt with varying cognition, aware of self, but states in hospital in [place], [place], asks multiple times how many children PT has, asks if he can "go for a ride" tomorrow. Will continue to progress acute PT as able.   Follow Up Recommendations  SNF     Equipment Recommendations  None recommended by PT    Recommendations for Other Services       Precautions / Restrictions Precautions Precautions: Fall Restrictions Weight Bearing Restrictions: No    Mobility  Bed Mobility Overal bed mobility: Needs AssistanceGeneral bed mobility comments: moderate assistance to reach UEs for R and L bedrail, assist to maintain hooklying position to avoid BLE from sliding    Transfers     Ambulation/Gait    Stairs             Wheelchair Mobility    Modified Rankin (Stroke Patients Only)       Balance           Cognition Arousal/Alertness: Lethargic Behavior During Therapy: Flat affect Overall Cognitive Status: Difficult to assess  General Comments: Pt states name, DOB and age appropriately. Pt  states date is July 2022, president is Doctor, general practice. Pt states location is [place], [place] in the hospital Pt states he lives with his spouse and his daughter and son are physical therapist's in [place]. Pt asks therapist how many children she has 4x during session, difficulty redirecting to task.      Exercises General Exercises - Lower Extremity Ankle Circles/Pumps: Supine;AROM;Strengthening;Both;10 reps Quad Sets: Supine;AROM;Strengthening;Both;5 reps (tactile cues) Heel Slides: Supine;AAROM;Strengthening;Both;5 reps (AAROM to prevent ER/abd)    General Comments        Pertinent Vitals/Pain Pain Assessment: No/denies pain Faces Pain Scale: No hurt    Home Living Family/patient expects to be discharged to:: Private residence Living Arrangements: Spouse/significant other                  Prior Function            PT Goals (current goals can now be found in the care plan section) Acute Rehab PT Goals Patient Stated Goal: "I want some water" PT Goal Formulation: With patient Time For Goal Achievement: 09/26/20 Potential to Achieve Goals: Fair Progress towards PT goals: Progressing toward goals (limited)    Frequency    Min 2X/week      PT Plan Current plan remains appropriate    Co-evaluation              AM-PAC PT "6 Clicks" Mobility   Outcome Measure  Help needed turning from your back to your side while in a flat bed without using bedrails?: Total Help needed moving from lying on your  back to sitting on the side of a flat bed without using bedrails?: Total Help needed moving to and from a bed to a chair (including a wheelchair)?: Total Help needed standing up from a chair using your arms (e.g., wheelchair or bedside chair)?: Total Help needed to walk in hospital room?: Total Help needed climbing 3-5 steps with a railing? : Total 6 Click Score: 6    End of Session   Activity Tolerance: Patient limited by lethargy Patient left: in bed;with call  bell/phone within reach;with bed alarm set Nurse Communication: Mobility status PT Visit Diagnosis: Difficulty in walking, not elsewhere classified (R26.2)     Time: 1007-1219 PT Time Calculation (min) (ACUTE ONLY): 19 min  Charges:  $Therapeutic Exercise: 8-22 mins                      Tori Marline Morace PT, DPT 09/18/20, 2:27 PM

## 2020-09-18 NOTE — Progress Notes (Signed)
Cardiology f/u arranged and placed on AVS. Dr. Johnsie Cancel s/o yesterday. Call with questions.

## 2020-09-18 NOTE — Progress Notes (Signed)
  Speech Language Pathology Treatment: Dysphagia  Patient Details Name: Jason Hartman MRN: [id number] DOB: [birth year] Today's Date: 09/18/2020 Time: 3428-7681 SLP Time Calculation (min) (ACUTE ONLY): 30 min  Assessment / Plan / Recommendation Clinical Impression  Patient seen for dysphagia treatment with focus on trials of PO's to determine readiness to start PO intake again. Patient's RN contacted SLP via [place] chat to inform that patient is more alert and is requesting water. Patient lethargic but alert enough for safe PO intake. SLP performed oral care prior to PO's with trace amount of oral secretions removed. Patient accepted consecutive straw sips of thin liquids (water and slightly thicker milkshake) with mild delayed cough response on approximately 4 sips out of total of about 20. No change in vocal quality and duration of each cough response was quite brief. Patient able to masticate graham cracker, however this was prolonged and SLP noted mild amount of PO residuals in oral cavity that had to be cleared out with sips of liquids, bite of puree (applesauce) and then toothette sponge removal. SLP is recommending to start patient on Full liquids and expect will be able to progress to solids when alertness improves.   HPI HPI: 85 y.o. male with medical history significant for OSA, Parkinson's, restless leg syndrome, anxiety, chronic pain, chronic kidney disease, and insulin-dependent diabetes mellitus, who presented to the emergency department for evaluation of anxiety and diaphoresis.  Patient has had a lot of medication changes recently including discontinuation of alprazolam and initiation of tramadol Celexa BuSpar.  During the course of this hospital stay has become very encephalopathic, etiology unclear.      SLP Plan  Continue with current plan of care       Recommendations  Diet recommendations: Thin liquid;Other(comment) (full liquids) Liquids provided via:  Cup;Straw Medication Administration: Crushed with puree Supervision: Full supervision/cueing for compensatory strategies;Staff to assist with self feeding Compensations: Slow rate;Small sips/bites;Lingual sweep for clearance of pocketing Postural Changes and/or Swallow Maneuvers: Seated upright 90 degrees;Upright 30-60 min after meal                Oral Care Recommendations: Oral care BID;Staff/trained caregiver to provide oral care Follow up Recommendations: Other (comment) (TBD) SLP Visit Diagnosis: Dysphagia, unspecified (R13.10) Plan: Continue with current plan of care        Sonia Baller, MA, CCC-SLP Speech Therapy

## 2020-09-18 NOTE — Progress Notes (Signed)
[place] for [facility] Indication: bridge therapy for Aflutter while apixaban on hold   Patient Measurements: Height: 5\' 4"  (162.6 cm) Weight: 81.8 kg (180 lb 5.4 oz) IBW/kg (Calculated) : 59.2 Heparin Dosing Weight:   Vital Signs: Temp: 98.1 F (36.7 C) (06/13 0337) Temp Source: Oral (06/13 0337) BP: 125/64 (06/13 0337) Pulse Rate: 83 (06/13 0337)  Labs: Recent Labs    09/16/20 0239 09/17/20 0321 09/18/20 0510  HGB 17.6*  --  16.4  HCT 56.4*  --  55.0*  PLT 191  --  162  CREATININE 1.34* 1.91* 1.36*  TROPONINIHS 22*  --   --      Estimated Creatinine Clearance: 40.4 mL/min (A) (by C-G formula based on SCr of 1.36 mg/dL (H)).    Medications:   amiodarone  200 mg Oral Daily   enoxaparin (LOVENOX) injection  1 mg/kg Subcutaneous Daily   insulin aspart  0-9 Units Subcutaneous Q4H   insulin glargine  5 Units Subcutaneous Daily   lidocaine  1 patch Transdermal Q24H   mouth rinse  15 mL Mouth Rinse BID   metoprolol tartrate  2.5 mg Intravenous Q6H   Rotigotine  1 patch Transdermal QPM   thiamine  100 mg Oral Daily    Assessment: 85 yo M admitted on 6/3 started on apixaban 5 mg po bid on 09/13/2020 for Aflutter.  Pharmacy consulted to dose LMWH for bridge therapy while pt NPO.  Today, 09/18/2020: SCr improved to 1.36, CrCl ~ 40 ml/min.   CBC WNL.   No bleeding reported.   Goal of Therapy:  Anti-Xa level 0.6-1 units/ml 4hrs after LMWH dose given Monitor platelets by anticoagulation protocol: Yes   Plan:  Increase to Lovenox 1 mg/kg (80mg ) SQ q12h F/u renal function Monitor for s/s bleeding Plan to resume apixaban once mental status improved and able to safely take [place] PharmD, BCPS Clinical Pharmacist [facility] main pharmacy [phone] 09/18/2020 7:49 AM

## 2020-09-18 NOTE — Progress Notes (Signed)
[facility] PROGRESS NOTE   Jason Hartman CVE:[id number] DOB: 08-Mar-1936 DOA: 09/08/2020  PCP: [name], MD  Brief History/Interval Summary: 85 y.o. male with medical history significant for OSA, Parkinson's, restless leg syndrome, anxiety, chronic pain, chronic kidney disease, and insulin-dependent diabetes mellitus, who presented to the emergency department for evaluation of anxiety and diaphoresis.  Patient has had a lot of medication changes recently including discontinuation of alprazolam and initiation of tramadol Celexa BuSpar.  During the course of this hospital stay has become very encephalopathic.  Neurology was consulted.  He had to be moved to the stepdown unit.  Subsequently found to have a UTI and bladder outlet obstruction requiring involvement of urology and a Foley catheter placement.  Became more encephalopathic.  Now there is concern for possible acute stroke.  Reason for Visit: Acute metabolic encephalopathy  Consultants:  Neurology Pulmonology Cardiology Urology  Procedures:   EEG did not show any epileptiform or seizure activity  Antibiotics: Anti-infectives (From admission, onward)    Start     Dose/Rate Route Frequency Ordered Stop   09/18/20 1000  Ampicillin-Sulbactam (UNASYN) 3 g in sodium chloride 0.9 % 100 mL IVPB        3 g 200 mL/hr over 30 Minutes Intravenous Every 6 hours 09/18/20 0809     09/15/20 1000  Ampicillin-Sulbactam (UNASYN) 3 g in sodium chloride 0.9 % 100 mL IVPB  Status:  Discontinued        3 g 200 mL/hr over 30 Minutes Intravenous Every 8 hours 09/15/20 0821 09/18/20 0809   09/13/20 0000  cefTRIAXone (ROCEPHIN) 1 g in sodium chloride 0.9 % 100 mL IVPB  Status:  Discontinued        1 g 200 mL/hr over 30 Minutes Intravenous Every 24 hours 09/12/20 2339 09/15/20 0809   09/09/20 0115  aztreonam (AZACTAM) 1 g in sodium chloride 0.9 % 100 mL IVPB        1 g 200 mL/hr over 30 Minutes Intravenous  Once 09/09/20 0106 09/09/20 0307        Subjective/Interval History: Patient noted to be more awake and alert today compared to yesterday.  Asking for something to drink.  States that he is hungry.     Assessment/Plan:  Acute metabolic encephalopathy Differential diagnosis has been broad including polypharmacy, withdrawal, serotonin syndrome which has been less likely.  UTI could also be contributing. LP was considered however it appears that his wife did not want him to undergo this test.  Meningitis was thought to be less likely so LP was not pursued. CT head shows chronic changes without any acute findings. EEG did not show any epileptiform activity. TSH 1.09 with free T4 of 1.3. Cortisol level 35.5.  W25 was normal.  Folic acid ammonia levels were normal. UA was noted to be abnormal.  Patient was complaining of dysuria and had suprapubic tenderness.  Started on antibiotics. Patient was requiring Precedex infusion which has been discontinued.   Neurology has signed off.   Patient subsequently found to have significantly distended urinary bladder which was likely reason for his pain and agitation.  Agitation subsided once a Foley catheter was placed by urology on 6/10. Patient remained quite lethargic for several days.  CT head was repeated which did not show any acute findings.  No obvious focal deficits were noted at that time. Now the patient is much more awake and alert.  Speech therapy is evaluating and cleared him for full liquids He is moving both his lower  legs equally.  The left upper extremity appears to be slightly weaker compared to the right.  He does not have any facial asymmetry.  It may not be unreasonable to do MRI brain to make sure he did not sustain stroke in the last few days.  Avoid sedating medications as much as possible.    Bladder outlet obstruction/history of urethral strictures CT scan raised concern for distention of the urinary bladder.  This is concerning for some form of bladder outlet  obstruction.  Likely reason for his suprapubic tenderness and pain issues. Attempts to place Foley by nursing staff were unsuccessful.  Urology was subsequently consulted.  After using multiple dilators of catheter was placed successfully. Discussed with wife.  Patient follows with a urologist in [facility] and has a known history of urethral strictures and sees a urologist multiple times during the year and undergoes cystoscopy every so often. Seems to be stable.  Foley catheter in place.  Abdomen is less tender. Will need outpatient follow-up with his urologist.  Transient hypoxia Respiratory status seems to be stable.  Hypoxia was primarily due to alteration in mental status and lethargy.  Chest x-ray did not show any obvious abnormalities except for atelectasis.    Urinary tract infection with Enterococcus UA was noted to be abnormal.  Patient was placed on ceftriaxone.  Urine cultures grew greater than 100,000 colonies of Enterococcus and less than 100,000 of Acinetobacter. Patient was changed over to Unasyn on 6/10.  Changed to oral when oral intake is more consistent.  We will plan for 7 to 10-day course.  Essential hypertension/transient hypotension Initially hypotensive requiring Levophed.   Subsequently hypertensive with systolics greater than 161 likely due to retention of urine, bladder distention and pain.   After placement of Foley catheter blood pressures have improved.  Blood pressure did decrease significantly so ARB was stopped.  Stable for the last 48 hours.  Mild rhabdomyolysis CK level improved from 2578 to 493.    Atrial flutter, new diagnosis Echocardiogram showed normal EF.  Cardiac status appears to be stable on amiodarone and metoprolol.   Due to altered mentation he was at high risk for aspiration and was NPO.  Eliquis was changed to Lovenox.  Continue Lovenox for now until his oral intake is more consistent.  Also on scheduled IV metoprolol.  Acute kidney injury  on chronic kidney disease stage IIIa/hypernatremia Rise in creatinine noted yesterday likely due to poor oral intake resulting in hypovolemia.  BUN was also elevated.  He was started on IV fluids with improvement in creatinine this morning.  We will continue to monitor for now.  Continue IV fluids for another day.   Sodium level stable.  Insulin-dependent diabetes mellitus HbA1c was 6.5 in May.  CBGs are elevated at times.  He remains on SSI and low-dose Lantus.  Staph hominis bacteremia Thought to be contaminant.  Repeat cultures were negative.  Obstructive sleep apnea CPAP nightly.  Elevated D-dimer Lower extremity Doppler studies negative.  VQ scan low probability.  History of parkinsonism's Noted to be on Rotigotine patch.  History of anxiety Recently stopped Xanax and benzodiazepine and so withdrawal was considered during earlier part of this admission.   Apparently has been taking high doses of gabapentin which is currently on hold.  Will resume at a lower dose once he is more awake and alert.  Valium was discontinued due to lethargy.  History of restless leg syndrome Gabapentin is on hold as discussed above.  Obesity Estimated body  mass index is 30.95 kg/m as calculated from the following:   Height as of this encounter: 5\' 4"  (1.626 m).   Weight as of this encounter: 81.8 kg.  Goals of care Family has been updated.  Discussion was held with patient's wife as well as his brother who is a Engineer, drilling in [place].  Continue full CODE STATUS for now.    DVT Prophylaxis: Apixaban Code Status: Full code Family Communication: Wife being updated daily. Disposition Plan: May need to go to rehab.  PT and OT evaluation.  Status is: Inpatient  Remains inpatient appropriate because:Altered mental status and IV treatments appropriate due to intensity of illness or inability to take PO   Dispo: The patient is from: Home              Anticipated d/c is to: SNF              Patient  currently is not medically stable to d/c.   Difficult to place patient No       Medications:  Scheduled:  amiodarone  200 mg Oral Daily   enoxaparin (LOVENOX) injection  1 mg/kg Subcutaneous Q12H   hydrocortisone cream   Topical BID   insulin aspart  0-9 Units Subcutaneous Q4H   insulin glargine  5 Units Subcutaneous Daily   lidocaine  1 patch Transdermal Q24H   mouth rinse  15 mL Mouth Rinse BID   metoprolol tartrate  2.5 mg Intravenous Q6H   Rotigotine  1 patch Transdermal QPM   thiamine  100 mg Oral Daily   Continuous:  sodium chloride 75 mL/hr at 09/17/20 2130   ampicillin-sulbactam (UNASYN) IV 3 g (09/18/20 1034)   INO:[id number] **OR** acetaminophen, diazepam, diphenhydrAMINE-zinc acetate, hydrALAZINE, naLOXone (NARCAN)  injection, ofloxacin, ondansetron **OR** ondansetron (ZOFRAN) IV, senna-docusate, technetium albumin aggregated   Objective:  Vital Signs  Vitals:   09/17/20 2200 09/17/20 2325 09/18/20 0337 09/18/20 0500  BP: (!) 122/56 130/69 125/64   Pulse: 79 87 83   Resp: (!) 23 17 18    Temp:  97.9 F (36.6 C) 98.1 F (36.7 C)   TempSrc:   Oral   SpO2: 97% 95% 99%   Weight:    81.8 kg  Height:        Intake/Output Summary (Last 24 hours) at 09/18/2020 1151 Last data filed at 09/18/2020 1037 Gross per 24 hour  Intake 1183.19 ml  Output 2300 ml  Net -1116.81 ml    Filed Weights   09/16/20 0509 09/17/20 0420 09/18/20 0500  Weight: 81.7 kg 80.5 kg 81.8 kg    General appearance: More awake and alert today. Resp: Diminished air entry at the bases.  No wheezing or rhonchi. Cardio: S1-S2 is normal regular.  No S3-S4.  No rubs murmurs or bruit GI: Abdomen is soft.  Nontender nondistended.  Bowel sounds are present normal.  No masses organomegaly Extremities: No edema.  Full range of motion of lower extremities. Neurologic: No facial asymmetry.  Left upper extremity is noted to be weaker compared to right.  Equal strength bilateral lower  extremities.      Lab Results:  Data Reviewed: I have personally reviewed following labs and imaging studies  CBC: Recent Labs  Lab 09/12/20 0038 09/13/20 0511 09/14/20 0254 09/15/20 0300 09/16/20 0239 09/18/20 0510  WBC 13.6* 9.0 8.9 15.5* 15.6* 12.0*  NEUTROABS 11.5*  --   --   --   --   --   HGB 15.1 14.6 14.7 17.8* 17.6* 16.4  HCT 48.9  47.0 47.4 55.7* 56.4* 55.0*  MCV 92.6 91.4 91.9 89.4 91.7 95.8  PLT 152 111* 125* 159 191 162     Basic Metabolic Panel: Recent Labs  Lab 09/12/20 0038 09/13/20 0511 09/14/20 0254 09/15/20 0300 09/16/20 0239 09/17/20 0321 09/18/20 0510  NA 132*   < > 137 135 141 146* 146*  K 3.9   < > 4.2 4.0 4.2 4.5 4.1  CL 102   < > 107 101 107 108 115*  CO2 23   < > 25 22 23 24 25   GLUCOSE 164*   < > 178* 224* 213* 209* 159*  BUN 42*   < > 21 21 42* 70* 71*  CREATININE 1.53*   < > 1.00 1.05 1.34* 1.91* 1.36*  CALCIUM 8.2*   < > 8.8* 9.2 9.1 9.0 8.6*  MG 1.9  --   --   --   --   --   --   PHOS 3.0  --   --   --   --   --   --    < > = values in this interval not displayed.     GFR: Estimated Creatinine Clearance: 40.4 mL/min (A) (by C-G formula based on SCr of 1.36 mg/dL (H)).  Liver Function Tests: Recent Labs  Lab 09/12/20 0038 09/13/20 0511  AST 49* 34  ALT 38 35  ALKPHOS 38 46  BILITOT 2.0* 0.9  PROT 5.2* 5.4*  ALBUMIN 2.9* 3.0*      Cardiac Enzymes: Recent Labs  Lab 09/12/20 0038  CKTOTAL 493*      CBG: Recent Labs  Lab 09/17/20 1940 09/17/20 2330 09/18/20 0335 09/18/20 0722 09/18/20 1124  GLUCAP 219* 182* 156* 145* 233*       Recent Results (from the past 240 hour(s))  Urine culture     Status: Abnormal   Collection Time: 09/08/20 11:35 PM   Specimen: Urine, Clean Catch  Result Value Ref Range Status   Specimen Description   Final    URINE, CLEAN CATCH Performed at [facility], [place] [address], [place], [place] [address]    Special Requests   Final    NONE Performed at  [facility], [place] [address], [place], [place] [address]    Culture (A)  Final    <10,000 COLONIES/mL INSIGNIFICANT GROWTH Performed at [facility] [address] [place], [place] [address]    Report Status 09/10/2020 FINAL  Final  Resp Panel by RT-PCR (Flu A&B, Covid) Nasopharyngeal Swab     Status: None   Collection Time: 09/08/20 11:41 PM   Specimen: Nasopharyngeal Swab; Nasopharyngeal(NP) swabs in vial transport medium  Result Value Ref Range Status   SARS Coronavirus 2 by RT PCR NEGATIVE NEGATIVE Final    Comment: (NOTE) SARS-CoV-2 target nucleic acids are NOT DETECTED.  The SARS-CoV-2 RNA is generally detectable in upper respiratory specimens during the acute phase of infection. The lowest concentration of SARS-CoV-2 viral copies this assay can detect is 138 copies/mL. A negative result does not preclude SARS-Cov-2 infection and should not be used as the sole basis for treatment or other patient management decisions. A negative result may occur with  improper specimen collection/handling, submission of specimen other than nasopharyngeal swab, presence of viral mutation(s) within the areas targeted by this assay, and inadequate number of viral copies(<138 copies/mL). A negative result must be combined with clinical observations, patient history, and epidemiological information. The expected result is Negative.  Fact Sheet for  Patients:  EntrepreneurPulse.com.au  Fact Sheet for Healthcare Providers:  IncredibleEmployment.be  This test is no t yet approved or cleared by the [place] FDA and  has been authorized for detection and/or diagnosis of SARS-CoV-2 by FDA under an Emergency Use Authorization (EUA). This EUA will remain  in effect (meaning this test can be used) for the duration of the COVID-19 declaration under Section 564(b)(1) of the Act, 21 U.S.C.section 360bbb-3(b)(1), unless the authorization is  terminated  or revoked sooner.       Influenza A by PCR NEGATIVE NEGATIVE Final   Influenza B by PCR NEGATIVE NEGATIVE Final    Comment: (NOTE) The Xpert Xpress SARS-CoV-2/FLU/RSV plus assay is intended as an aid in the diagnosis of influenza from Nasopharyngeal swab specimens and should not be used as a sole basis for treatment. Nasal washings and aspirates are unacceptable for Xpert Xpress SARS-CoV-2/FLU/RSV testing.  Fact Sheet for Patients: EntrepreneurPulse.com.au  Fact Sheet for Healthcare Providers: IncredibleEmployment.be  This test is not yet approved or cleared by the [place] FDA and has been authorized for detection and/or diagnosis of SARS-CoV-2 by FDA under an Emergency Use Authorization (EUA). This EUA will remain in effect (meaning this test can be used) for the duration of the COVID-19 declaration under Section 564(b)(1) of the Act, 21 U.S.C. section 360bbb-3(b)(1), unless the authorization is terminated or revoked.  Performed at [facility], [place] [address], [place], [place] [address]   Blood culture (routine x 2)     Status: Abnormal   Collection Time: 09/09/20  1:03 AM   Specimen: BLOOD  Result Value Ref Range Status   Specimen Description   Final    BLOOD RIGHT WRIST Performed at [facility] [address] [place], [place] [address]    Special Requests   Final    BOTTLES DRAWN AEROBIC AND ANAEROBIC Blood Culture adequate volume Performed at [facility] [address] [place], [place] [address]    Culture  Setup Time   Final    GRAM POSITIVE COCCI IN CLUSTERS IN BOTH AEROBIC AND ANAEROBIC BOTTLES CRITICAL RESULT CALLED TO, READ BACK BY AND VERIFIED WITH: PHARMD MICHELLE LILLISTAN 09/09/2020 AT 2220 A.HUGHES    Culture (A)  Final    STAPHYLOCOCCUS HOMINIS UNABLE TO ISOLATE S.EPIDERMIDIS THE SIGNIFICANCE OF ISOLATING THIS ORGANISM FROM A SINGLE  SET OF BLOOD CULTURES WHEN MULTIPLE SETS ARE DRAWN IS UNCERTAIN. PLEASE NOTIFY THE MICROBIOLOGY DEPARTMENT WITHIN ONE WEEK IF SPECIATION AND SENSITIVITIES ARE REQUIRED. Performed at [facility], [place] [address], [place], [place] [address]    Report Status 09/13/2020 FINAL  Final  Blood Culture ID Panel (Reflexed)     Status: Abnormal   Collection Time: 09/09/20  1:03 AM  Result Value Ref Range Status   Enterococcus faecalis NOT DETECTED NOT DETECTED Final   Enterococcus Faecium NOT DETECTED NOT DETECTED Final   Listeria monocytogenes NOT DETECTED NOT DETECTED Final   Staphylococcus species DETECTED (A) NOT DETECTED Final    Comment: CRITICAL RESULT CALLED TO, READ BACK BY AND VERIFIED WITH: PHARMD MICHELLE LILLISTAN 09/09/2020 AT 2220 A.HUGHES    Staphylococcus aureus (BCID) NOT DETECTED NOT DETECTED Final   Staphylococcus epidermidis DETECTED (A) NOT DETECTED Final    Comment: Methicillin (oxacillin) resistant coagulase negative staphylococcus. Possible blood culture contaminant (unless isolated from more than one blood culture draw or clinical case suggests pathogenicity). No antibiotic treatment is indicated for blood  culture contaminants. CRITICAL RESULT CALLED TO, READ BACK BY  AND VERIFIED WITH: PHARMD MICHELLE LILLISTAN 09/09/2020 AT 2220 A.HUGHES    Staphylococcus lugdunensis NOT DETECTED NOT DETECTED Final   Streptococcus species NOT DETECTED NOT DETECTED Final   Streptococcus agalactiae NOT DETECTED NOT DETECTED Final   Streptococcus pneumoniae NOT DETECTED NOT DETECTED Final   Streptococcus pyogenes NOT DETECTED NOT DETECTED Final   A.calcoaceticus-baumannii NOT DETECTED NOT DETECTED Final   Bacteroides fragilis NOT DETECTED NOT DETECTED Final   Enterobacterales NOT DETECTED NOT DETECTED Final   Enterobacter cloacae complex NOT DETECTED NOT DETECTED Final   Escherichia coli NOT DETECTED NOT DETECTED Final   Klebsiella aerogenes NOT DETECTED NOT DETECTED Final    Klebsiella oxytoca NOT DETECTED NOT DETECTED Final   Klebsiella pneumoniae NOT DETECTED NOT DETECTED Final   Proteus species NOT DETECTED NOT DETECTED Final   Salmonella species NOT DETECTED NOT DETECTED Final   Serratia marcescens NOT DETECTED NOT DETECTED Final   Haemophilus influenzae NOT DETECTED NOT DETECTED Final   Neisseria meningitidis NOT DETECTED NOT DETECTED Final   Pseudomonas aeruginosa NOT DETECTED NOT DETECTED Final   Stenotrophomonas maltophilia NOT DETECTED NOT DETECTED Final   Candida albicans NOT DETECTED NOT DETECTED Final   Candida auris NOT DETECTED NOT DETECTED Final   Candida glabrata NOT DETECTED NOT DETECTED Final   Candida krusei NOT DETECTED NOT DETECTED Final   Candida parapsilosis NOT DETECTED NOT DETECTED Final   Candida tropicalis NOT DETECTED NOT DETECTED Final   Cryptococcus neoformans/gattii NOT DETECTED NOT DETECTED Final   Methicillin resistance mecA/C DETECTED (A) NOT DETECTED Final    Comment: CRITICAL RESULT CALLED TO, READ BACK BY AND VERIFIED WITH: PHARMD MICHELLE LILLISTAN 09/09/2020 AT 2220 A.HUGHES Performed at [facility], [place] [address], [place], [place] [address]   Blood culture (routine x 2)     Status: None   Collection Time: 09/09/20  1:08 AM   Specimen: BLOOD  Result Value Ref Range Status   Specimen Description   Final    BLOOD LEFT WRIST Performed at [facility] [address], [place], [place] [address]    Special Requests   Final    BOTTLES DRAWN AEROBIC AND ANAEROBIC Blood Culture adequate volume Performed at [facility] [address], [place], [place] [address]    Culture   Final    NO GROWTH 5 DAYS Performed at [facility], [place] [address], [place], [place] [address]    Report Status 09/14/2020 FINAL  Final  MRSA PCR Screening     Status: Abnormal   Collection Time: 09/10/20  5:19 AM   Specimen: Nasal Mucosa; Nasopharyngeal  Result Value Ref Range Status   MRSA  by PCR POSITIVE (A) NEGATIVE Final    Comment:        The GeneXpert MRSA Assay (FDA approved for NASAL specimens only), is one component of a comprehensive MRSA colonization surveillance program. It is not intended to diagnose MRSA infection nor to guide or monitor treatment for MRSA infections. RESULT CALLED TO, READ BACK BY AND VERIFIED WITH: HEAVNER,A. RN AT 1150 09/10/20 MULLINS,T Performed at [facility], [place] [address], [place], [place] [address]   Culture, blood (routine x 2)     Status: None   Collection Time: 09/10/20 11:03 AM   Specimen: BLOOD  Result Value Ref Range Status   Specimen Description   Final    BLOOD LEFT FOREARM Performed at [facility] [address] [place], [place] [address]    Special  Requests   Final    BOTTLES DRAWN AEROBIC ONLY Blood Culture results may not be optimal due to an inadequate volume of blood received in culture bottles Performed at [facility] [address] [place], [place] [address]    Culture   Final    NO GROWTH 5 DAYS Performed at [facility], [place] [address] [place], [place] [address]    Report Status 09/15/2020 FINAL  Final  Culture, blood (routine x 2)     Status: None   Collection Time: 09/10/20 11:03 AM   Specimen: BLOOD  Result Value Ref Range Status   Specimen Description   Final    BLOOD RIGHT ANTECUBITAL Performed at [facility] [address], [place], [place] [address]    Special Requests   Final    BOTTLES DRAWN AEROBIC AND ANAEROBIC Blood Culture adequate volume Performed at [facility] [address], [place], [place] [address]    Culture   Final    NO GROWTH 5 DAYS Performed at [facility], [place] [address] [place], [place] [address]    Report Status 09/15/2020 FINAL  Final  Culture, Urine     Status: Abnormal   Collection Time: 09/12/20 11:39 PM   Specimen: Urine, Clean Catch  Result  Value Ref Range Status   Specimen Description   Final    URINE, CLEAN CATCH Performed at [facility], [place] [address], [place], [place] [address]    Special Requests   Final    NONE Performed at [facility], [place] [address] [place], [place] [address]    Culture (A)  Final    >=100,000 COLONIES/mL ENTEROCOCCUS FAECALIS 10,000 COLONIES/mL ACINETOBACTER CALCOACETICUS/BAUMANNII COMPLEX    Report Status 09/15/2020 FINAL  Final   Organism ID, Bacteria ENTEROCOCCUS FAECALIS (A)  Final   Organism ID, Bacteria ACINETOBACTER CALCOACETICUS/BAUMANNII COMPLEX (A)  Final      Susceptibility   Acinetobacter calcoaceticus/baumannii complex - MIC*    CEFTAZIDIME 4 SENSITIVE Sensitive     CIPROFLOXACIN <=0.25 SENSITIVE Sensitive     GENTAMICIN <=1 SENSITIVE Sensitive     IMIPENEM <=0.25 SENSITIVE Sensitive     PIP/TAZO 8 SENSITIVE Sensitive     TRIMETH/SULFA <=20 SENSITIVE Sensitive     AMPICILLIN/SULBACTAM <=2 SENSITIVE Sensitive     * 10,000 COLONIES/mL ACINETOBACTER CALCOACETICUS/BAUMANNII COMPLEX   Enterococcus faecalis - MIC*    AMPICILLIN <=2 SENSITIVE Sensitive     NITROFURANTOIN <=16 SENSITIVE Sensitive     VANCOMYCIN 1 SENSITIVE Sensitive     * >=100,000 COLONIES/mL ENTEROCOCCUS FAECALIS      Radiology Studies: CT HEAD WO CONTRAST  Result Date: 09/16/2020 CLINICAL DATA:  Mental status change EXAM: CT HEAD WITHOUT CONTRAST TECHNIQUE: Contiguous axial images were obtained from the base of the skull through the vertex without intravenous contrast. COMPARISON:  09/10/2020 FINDINGS: Brain: There is no acute intracranial hemorrhage, mass effect, or edema. Gray-white differentiation is preserved. There is no extra-axial fluid collection. Prominence of the ventricles and sulci reflects stable generalized cerebral and cerebellar volume loss. Patchy areas of low-attenuation the supratentorial white matter are nonspecific but probably reflects stable chronic  microvascular ischemic changes. There are chronic small vessel infarcts of the basal ganglia bilaterally. Vascular: There is atherosclerotic calcification at the skull base. Skull: Calvarium is unremarkable. Sinuses/Orbits: No acute finding. Other: None. IMPRESSION: No acute intracranial abnormality or significant change since recent prior study. Electronically Signed   By: Addison Lank.D.  On: 09/16/2020 15:18        LOS: 8 days   Pura Picinich [employer] Pager on [email or web address]  09/18/2020, 11:51 AM

## 2020-09-18 NOTE — Progress Notes (Signed)
PHARMACY NOTE:  ANTIMICROBIAL RENAL DOSAGE ADJUSTMENT  Current antimicrobial regimen includes a mismatch between antimicrobial dosage and estimated renal function.  As per policy approved by the Pharmacy & Therapeutics and Medical Executive Committees, the antimicrobial dosage will be adjusted accordingly.  Current antimicrobial dosage:  Unasyn 3g IV q8h  Indication: UTI  Renal Function:  Estimated Creatinine Clearance: 40.4 mL/min (A) (by C-G formula based on SCr of 1.36 mg/dL (H)).    Antimicrobial dosage has been changed to:  Unasyn 3g IV q6h  Additional comments:   Thank you for allowing pharmacy to be a part of this patient's care.  Gretta Arab PharmD, BCPS Clinical Pharmacist [facility] main pharmacy [phone] 09/18/2020 8:08 AM

## 2020-09-19 ENCOUNTER — Inpatient Hospital Stay (HOSPITAL_COMMUNITY): Payer: Medicare Other

## 2020-09-19 LAB — BASIC METABOLIC PANEL
Anion gap: 7 (ref 5–15)
BUN: 47 mg/dL — ABNORMAL HIGH (ref 8–23)
CO2: 26 mmol/L (ref 22–32)
Calcium: 8.2 mg/dL — ABNORMAL LOW (ref 8.9–10.3)
Chloride: 106 mmol/L (ref 98–111)
Creatinine, Ser: 1 mg/dL (ref 0.61–1.24)
GFR, Estimated: >60 mL/min (ref >60)
Glucose, Bld: 117 mg/dL — ABNORMAL HIGH (ref 70–99)
Potassium: 3.9 mmol/L (ref 3.5–5.1)
Sodium: 139 mmol/L (ref 135–145)

## 2020-09-19 LAB — CBC
HCT: 49.1 % (ref 39.0–52.0)
Hemoglobin: 15 g/dL (ref 13.0–17.0)
MCH: 28.7 pg (ref 26.0–34.0)
MCHC: 30.5 g/dL (ref 30.0–36.0)
MCV: 94.1 fL (ref 80.0–100.0)
Platelets: 154 10*3/uL (ref 150–400)
RBC: 5.22 MIL/uL (ref 4.22–5.81)
RDW: 14.1 % (ref 11.5–15.5)
WBC: 8.5 10*3/uL (ref 4.0–10.5)
nRBC: 0 % (ref 0.0–0.2)

## 2020-09-19 LAB — GLUCOSE, CAPILLARY
Glucose-Capillary: 103 mg/dL — ABNORMAL HIGH (ref 70–99)
Glucose-Capillary: 107 mg/dL — ABNORMAL HIGH (ref 70–99)
Glucose-Capillary: 119 mg/dL — ABNORMAL HIGH (ref 70–99)
Glucose-Capillary: 148 mg/dL — ABNORMAL HIGH (ref 70–99)
Glucose-Capillary: 159 mg/dL — ABNORMAL HIGH (ref 70–99)
Glucose-Capillary: 172 mg/dL — ABNORMAL HIGH (ref 70–99)
Glucose-Capillary: 199 mg/dL — ABNORMAL HIGH (ref 70–99)

## 2020-09-19 IMAGING — MR MR CERVICAL SPINE W/O CM
14 of 16 series · 42 of 48 positions shown · non-contrast
Comparison: None.

CLINICAL DATA: Myelopathy.

EXAM:
MRI CERVICAL SPINE WITHOUT CONTRAST
TECHNIQUE: Multiplanar, multisequence MR imaging of the cervical spine was
performed. No intravenous contrast was administered.

[Series 6: dwi_tracew · axial · 3.0mm · 1.08mm/px · z∈[-62,+88]mm · 8 of 102 slices shown]
[im 1/102]
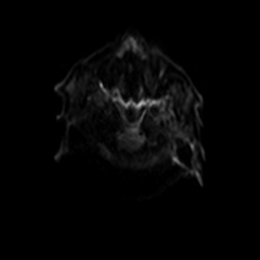
[im 12/102]
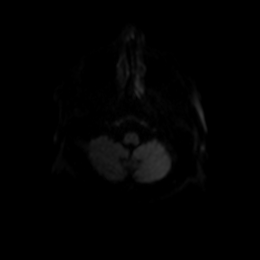
[im 34/102]
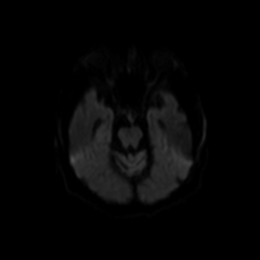
[im 45/102]
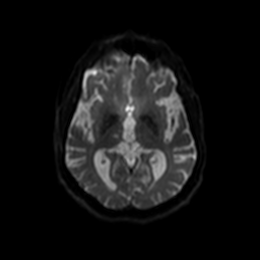
[im 57/102]
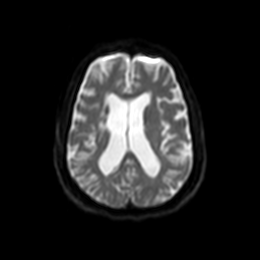
[im 68/102]
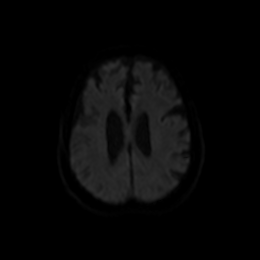
[im 90/102]
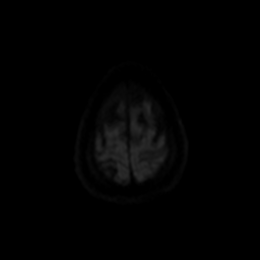
[im 102/102]
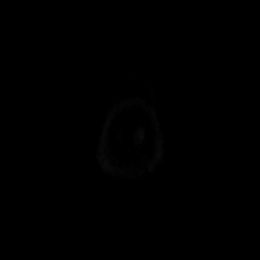

[Series 7: dwi_adc · axial · 3.0mm · 1.08mm/px · z∈[-62,+88]mm · 5 of 51 slices shown]
[im 1/51]
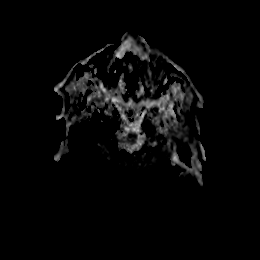
[im 13/51]
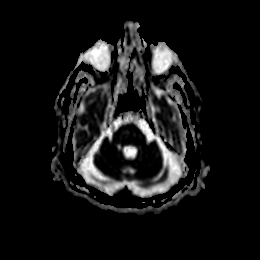
[im 26/51]
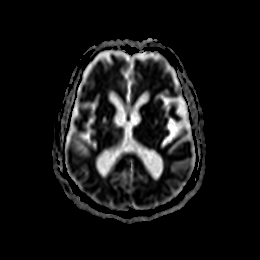
[im 38/51]
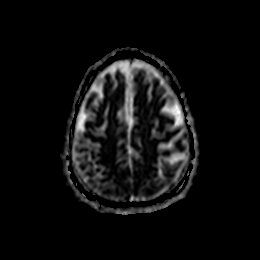
[im 51/51]
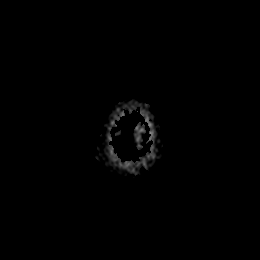

[Series 8: T2 · sagittal · 5.0mm · 0.47mm/px · 3 of 24 slices shown (1 of 5)]
[im 1/24]
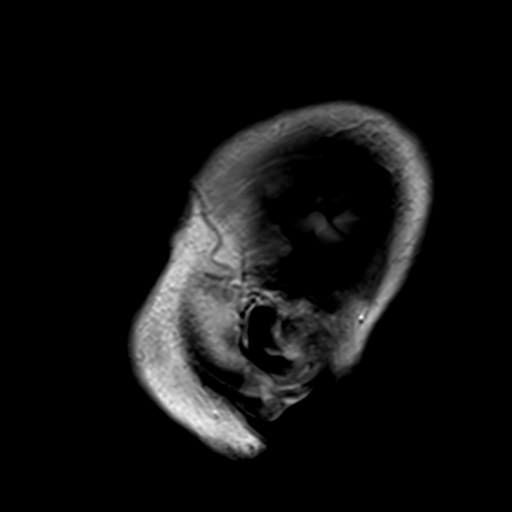
[im 12/24]
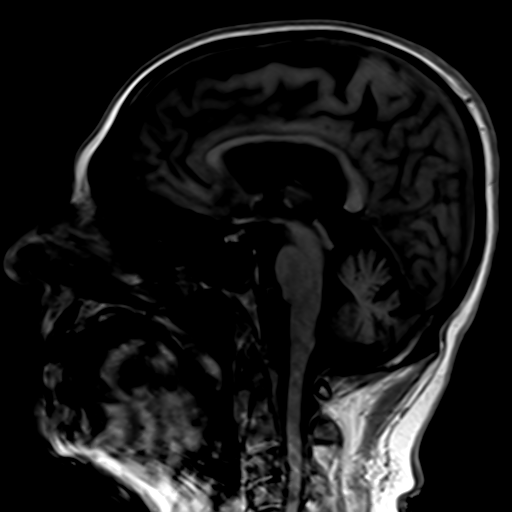
[im 24/24]
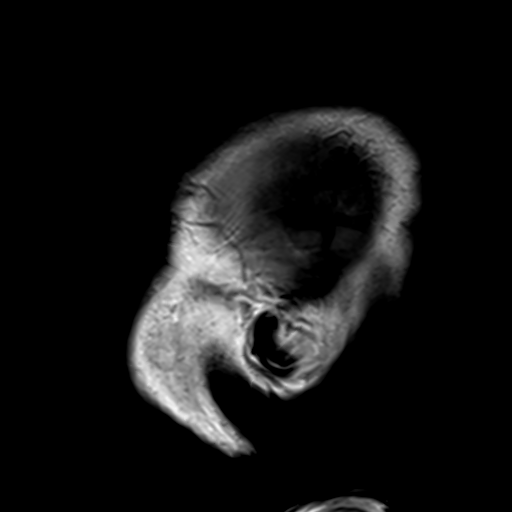

[Series 9: T2 · axial · 5.0mm · 0.45mm/px · z∈[-72,+90]mm · 2 of 26 slices shown (2 of 5)]
[im 1/26]
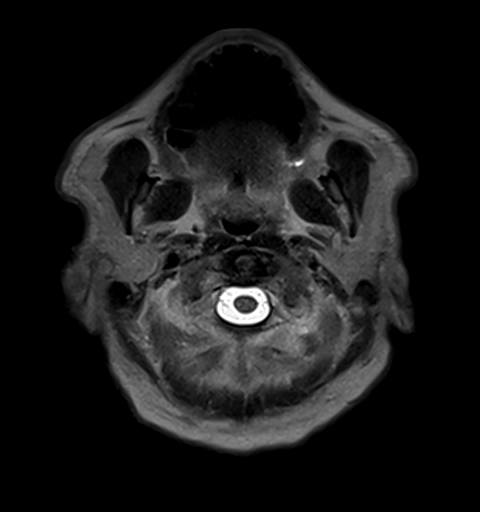
[im 26/26]
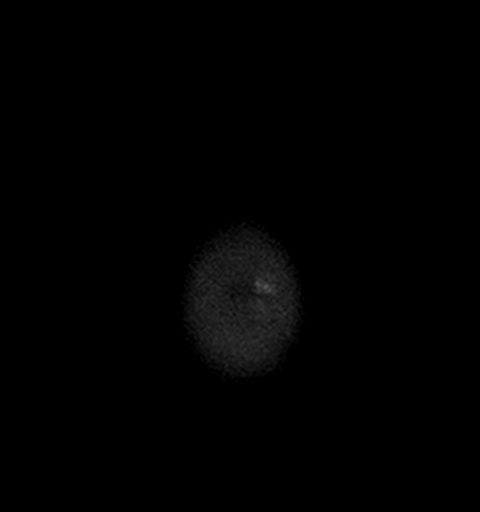

[Series 10: GRE · axial · 3.0mm · 0.47mm/px · z∈[-64,+86]mm · 4 of 51 slices shown]
[im 1/51]
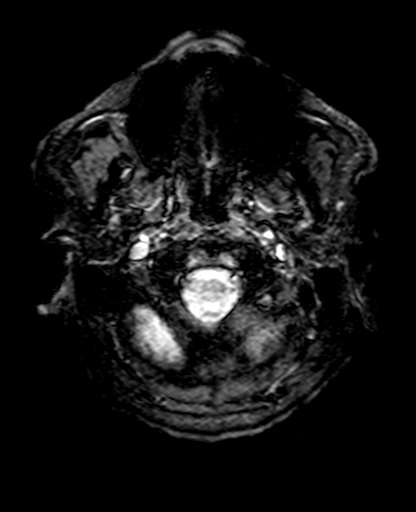
[im 17/51]
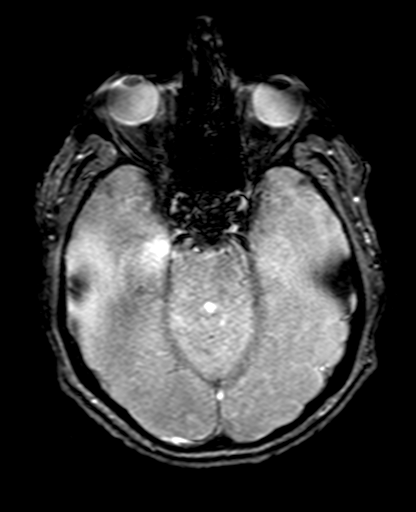
[im 34/51]
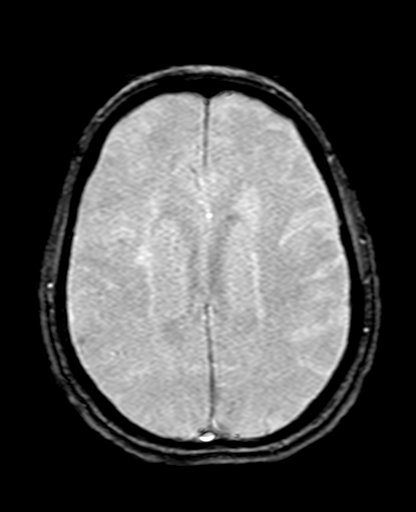
[im 51/51]
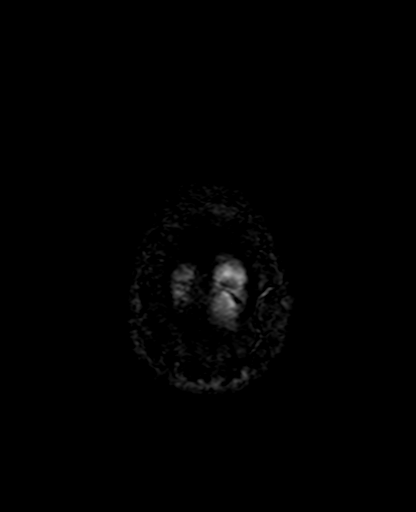

[Series 11: FLAIR · axial · 3.0mm · 0.94mm/px · z∈[-65,+84]mm · 4 of 51 slices shown]
[im 1/51]
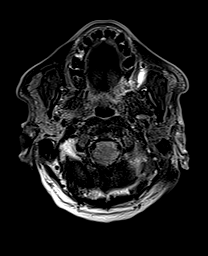
[im 17/51]
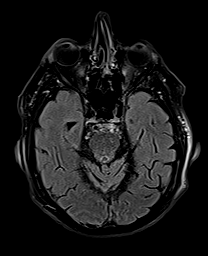
[im 34/51]
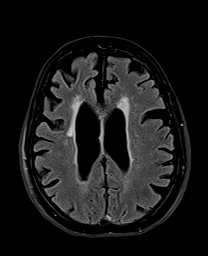
[im 51/51]
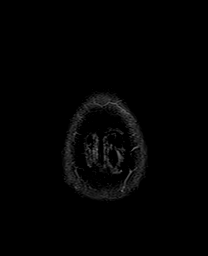

[Series 12: T1 · axial · 3.0mm · 0.45mm/px · z∈[-64,+85]mm · 4 of 51 slices shown (1 of 3)]
[im 1/51]
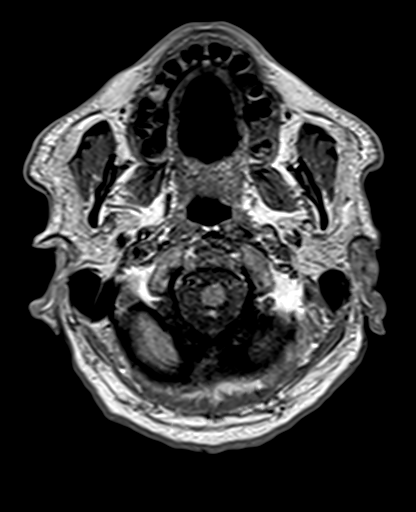
[im 17/51]
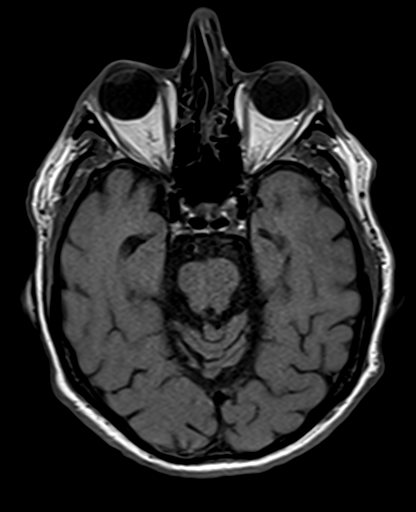
[im 34/51]
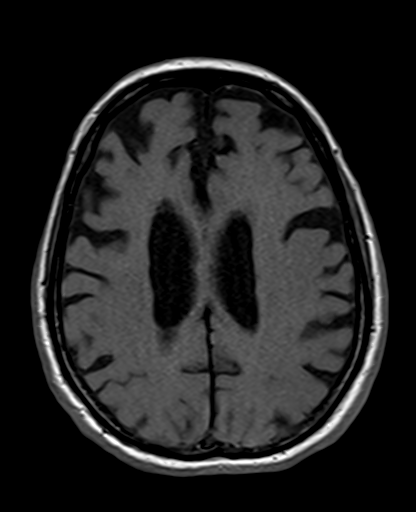
[im 51/51]
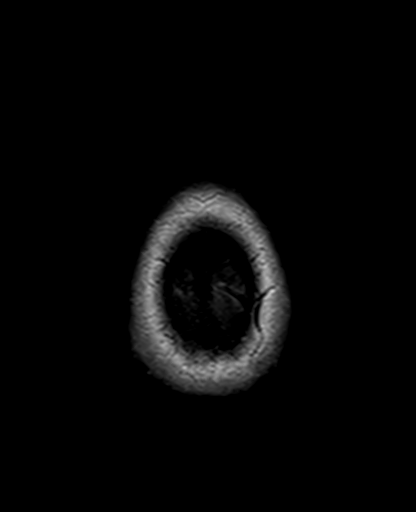

[Series 13: DWI · coronal · 5.0mm · 1.31mm/px · 4 of 48 slices shown]
[im 1/48]
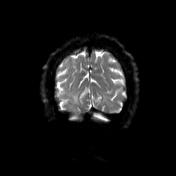
[im 16/48]
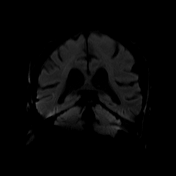
[im 32/48]
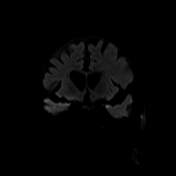
[im 48/48]
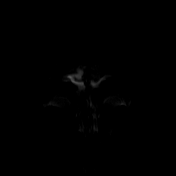

[Series 15: T2 · coronal · 5.0mm · 0.86mm/px · 2 of 24 slices shown (3 of 5)]
[im 1/24]
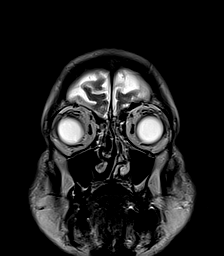
[im 24/24]
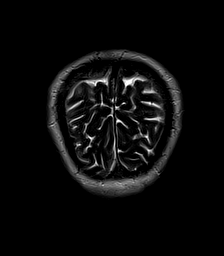

[Series 20: T1 · sagittal · 3.0mm · 0.86mm/px · 1 of 16 slices shown (2 of 3)]
[im 1/16]
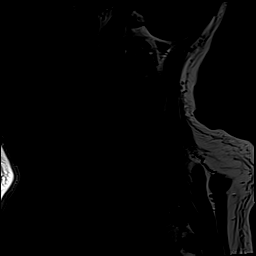

[Series 21: T2 · sagittal · 3.0mm · 0.86mm/px · 1 of 16 slices shown (4 of 5)]
[im 1/16]
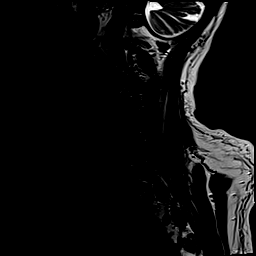

[Series 22: STIR · sagittal · 3.0mm · 0.86mm/px · 1 of 16 slices shown]
[im 1/16]
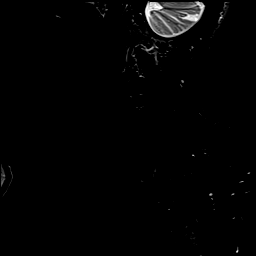

[Series 23: T2 · axial · 3.5mm · 0.70mm/px · z∈[-43,+43]mm · 2 of 23 slices shown (5 of 5)]
[im 1/23]
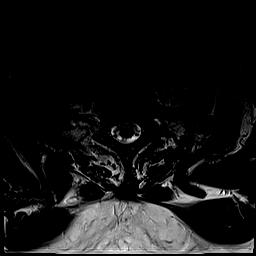
[im 23/23]
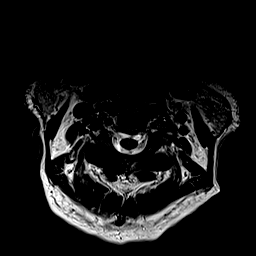

[Series 25: T1 · sagittal · 3.0mm · 0.86mm/px · 1 of 16 slices shown (3 of 3)]
[im 1/16]
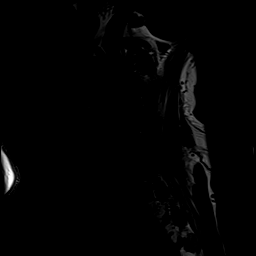

[42 of 48 positions shown; findings below may reference images not displayed]

FINDINGS: Alignment: Mild retrolisthesis C4-5.  Otherwise normal alignment

Vertebrae: Negative for fracture or mass. Mild bone marrow edema on
the left involving the superior endplate of C5 likely hemangioma.
This is intermediate signal on T1. No priors for comparison. Small
area of fatty marrow on the left C4. Possible hemangioma.

Cord: Cord evaluation limited by motion. No cord signal abnormality
identified.

Posterior Fossa, vertebral arteries, paraspinal tissues: Negative

Disc levels:

C2-3: Negative

C3-4: Moderate disc degeneration with disc space narrowing and
diffuse uncinate spurring. Mild spinal stenosis. Moderate foraminal
stenosis bilaterally due to spurring

C4-5: Moderate disc degeneration with disc space narrowing and
diffuse uncinate spurring. Mild spinal stenosis. Moderate foraminal
stenosis bilaterally due to spurring.

C6-7: Disc degeneration with diffuse uncinate spurring. Mild
foraminal narrowing bilaterally

C6-7: Disc degeneration with diffuse uncinate spurring. Mild
foraminal narrowing bilaterally.

C7-T1: Negative for stenosis.
IMPRESSION: Cervical spondylosis. Spinal and foraminal stenosis most prominent
C3-4 and C4-5. No acute abnormality.

## 2020-09-19 IMAGING — MR MR HEAD W/O CM
15 of 16 series · 40 of 48 positions shown · non-contrast
Comparison: CT head [DATE]

CLINICAL DATA: Acute neuro deficit

EXAM:
MRI HEAD WITHOUT CONTRAST
TECHNIQUE: Multiplanar, multiecho pulse sequences of the brain and surrounding
structures were obtained without intravenous contrast.

[Series 5: dwi_tracew · axial · 3.0mm · 1.08mm/px · z∈[-62,+70]mm · 7 of 102 slices shown]
[im 1/102]
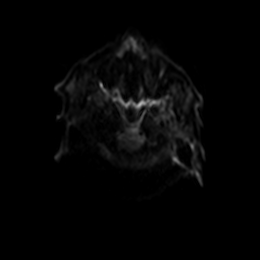
[im 12/102]
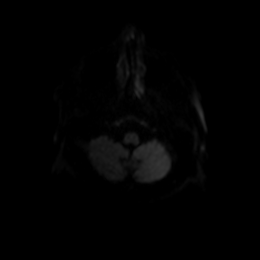
[im 34/102]
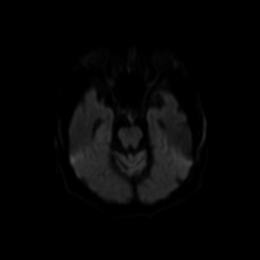
[im 45/102]
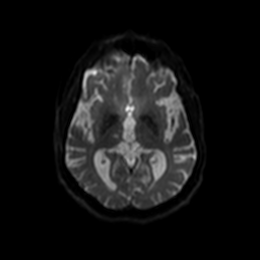
[im 57/102]
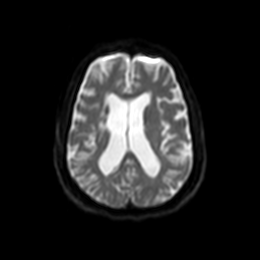
[im 68/102]
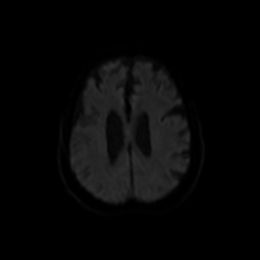
[im 90/102]
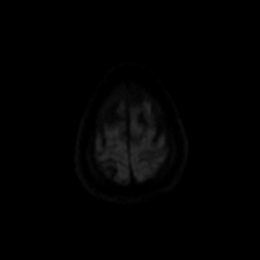

[Series 7: T2 · sagittal · 5.0mm · 0.47mm/px · 3 of 24 slices shown (1 of 5)]
[im 1/24]
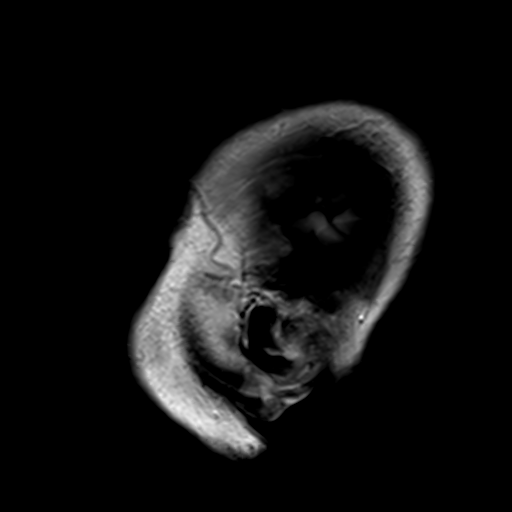
[im 12/24]
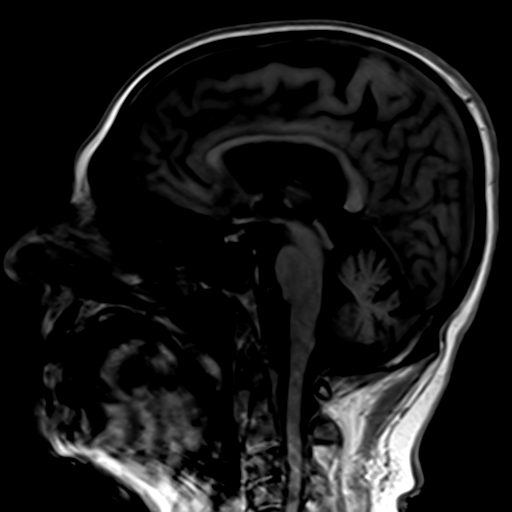
[im 24/24]
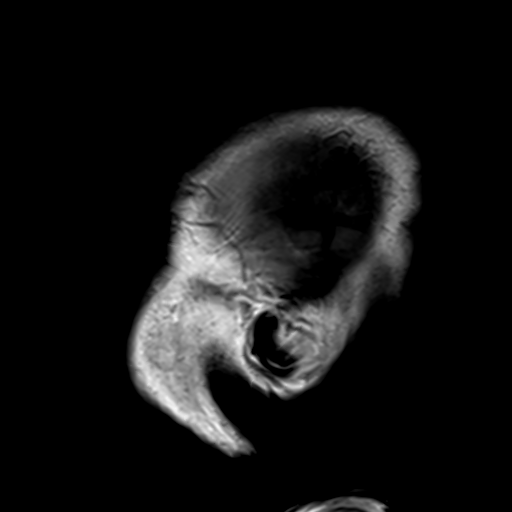

[Series 8: T2 · axial · 5.0mm · 0.45mm/px · z∈[-72,+90]mm · 2 of 26 slices shown (2 of 5)]
[im 1/26]
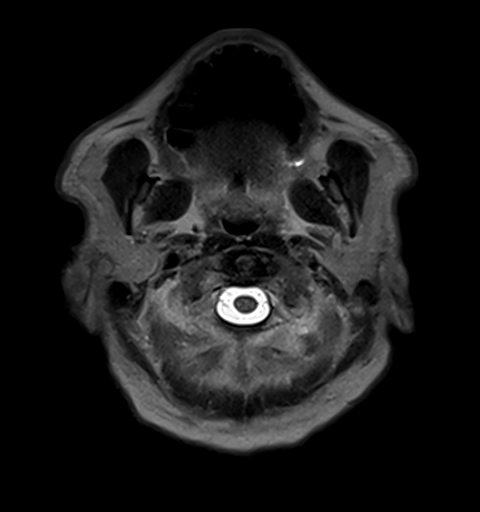
[im 26/26]
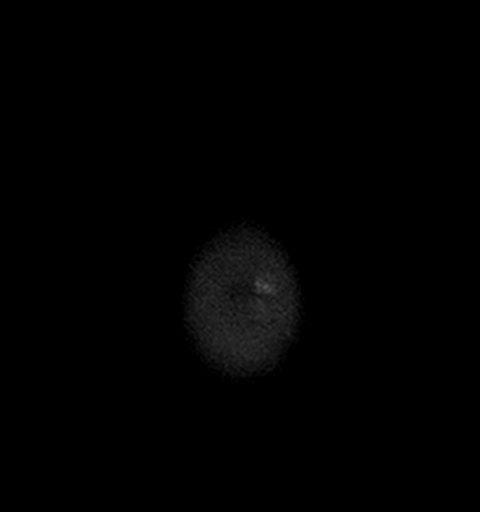

[Series 9: GRE · axial · 3.0mm · 0.47mm/px · z∈[-64,+86]mm · 4 of 51 slices shown (1 of 2)]
[im 1/51]
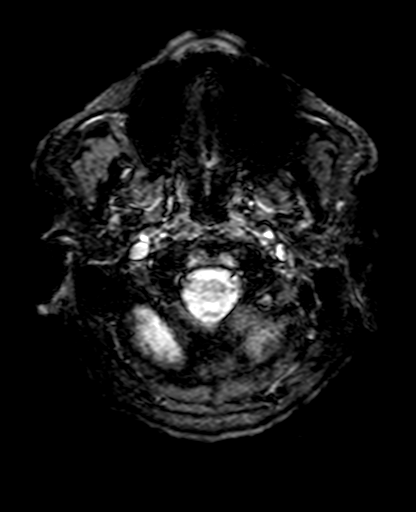
[im 17/51]
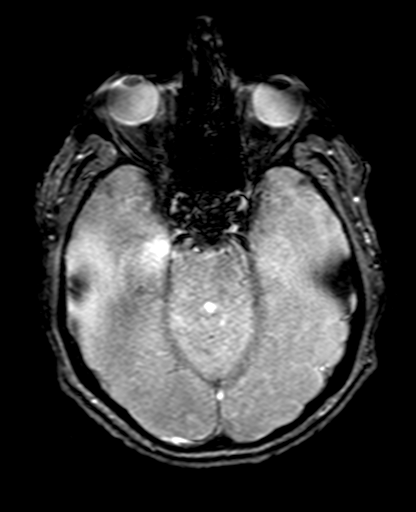
[im 34/51]
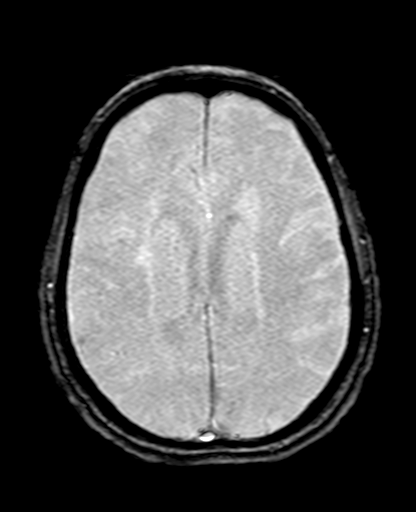
[im 51/51]
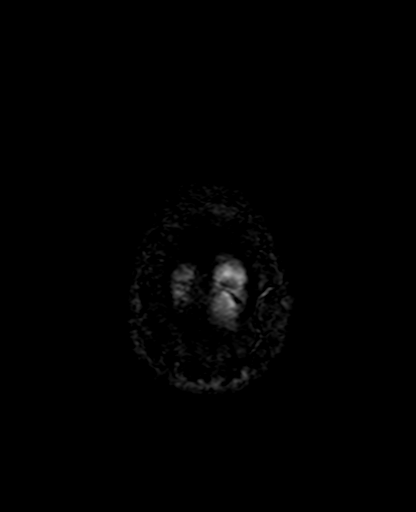

[Series 10: FLAIR · axial · 3.0mm · 0.94mm/px · z∈[-65,+84]mm · 4 of 51 slices shown]
[im 1/51]
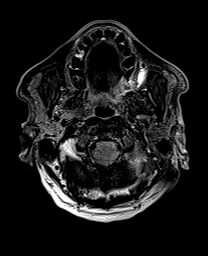
[im 17/51]
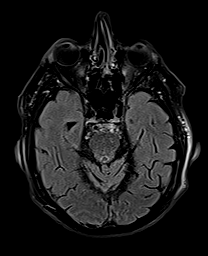
[im 34/51]
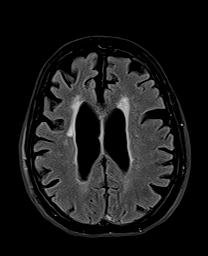
[im 51/51]
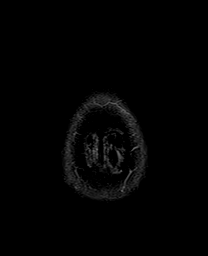

[Series 11: T1 · axial · 3.0mm · 0.45mm/px · z∈[-64,+85]mm · 4 of 51 slices shown (1 of 3)]
[im 1/51]
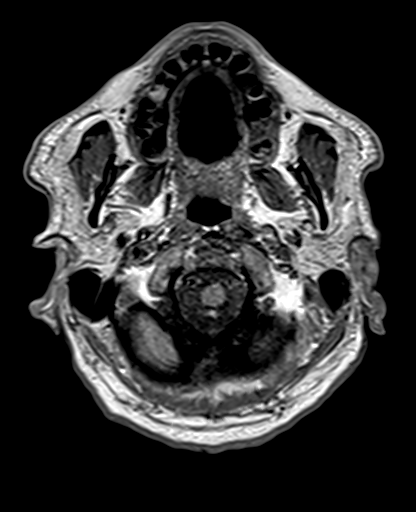
[im 17/51]
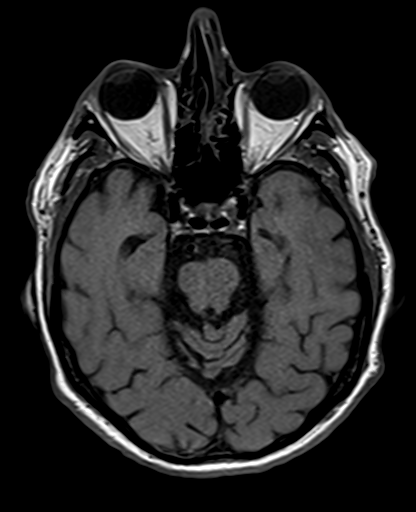
[im 34/51]
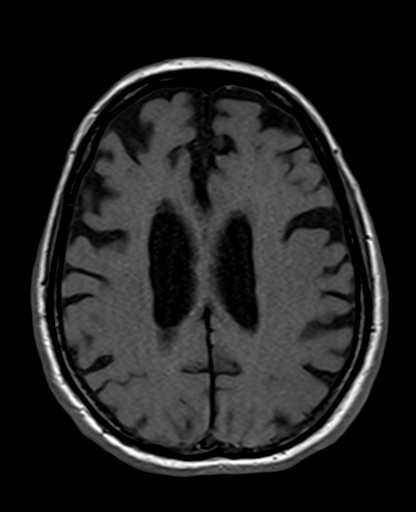
[im 51/51]
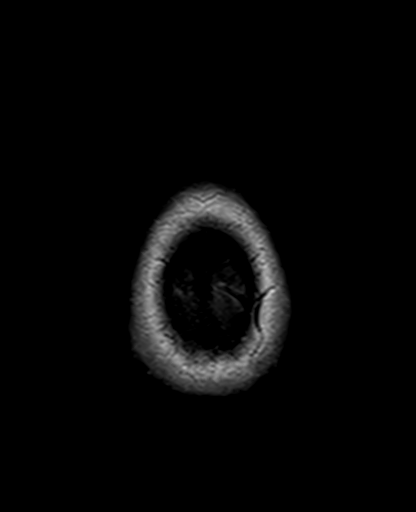

[Series 12: DWI · coronal · 5.0mm · 1.31mm/px · 4 of 48 slices shown (1 of 2)]
[im 1/48]
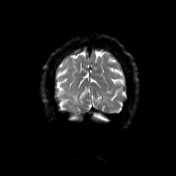
[im 16/48]
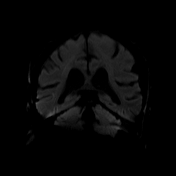
[im 32/48]
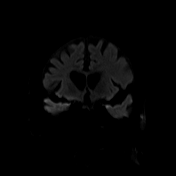
[im 48/48]
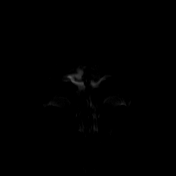

[Series 13: DWI · coronal · 5.0mm · 1.31mm/px · 2 of 24 slices shown (2 of 2)]
[im 1/24]
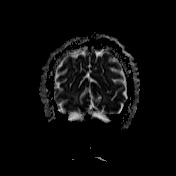
[im 24/24]
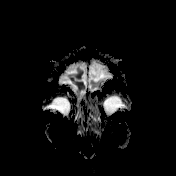

[Series 14: T2 · coronal · 5.0mm · 0.86mm/px · 2 of 24 slices shown (3 of 5)]
[im 1/24]
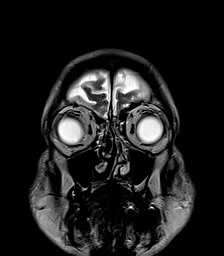
[im 24/24]
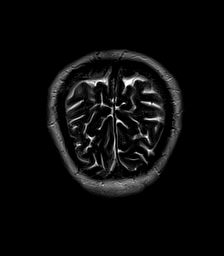

[Series 19: T1 · sagittal · 3.0mm · 0.86mm/px · 1 of 16 slices shown (2 of 3)]
[im 1/16]
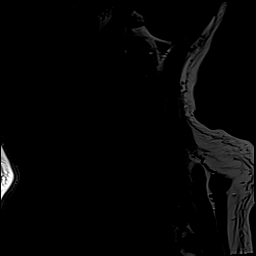

[Series 20: T2 · sagittal · 3.0mm · 0.86mm/px · 1 of 16 slices shown (4 of 5)]
[im 1/16]
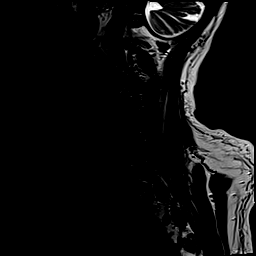

[Series 21: STIR · sagittal · 3.0mm · 0.86mm/px · 1 of 16 slices shown]
[im 1/16]
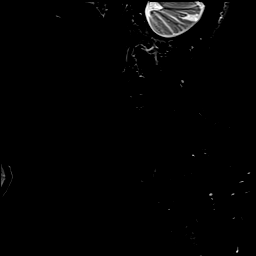

[Series 22: T2 · axial · 3.5mm · 0.70mm/px · z∈[-43,+43]mm · 2 of 23 slices shown (5 of 5)]
[im 1/23]
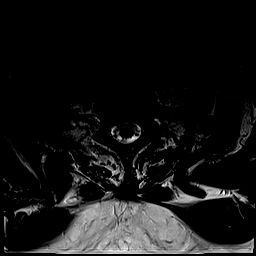
[im 23/23]
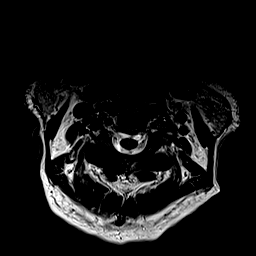

[Series 23: GRE · axial · 3.5mm · 0.35mm/px · z∈[-43,+43]mm · 2 of 23 slices shown (2 of 2)]
[im 1/23]
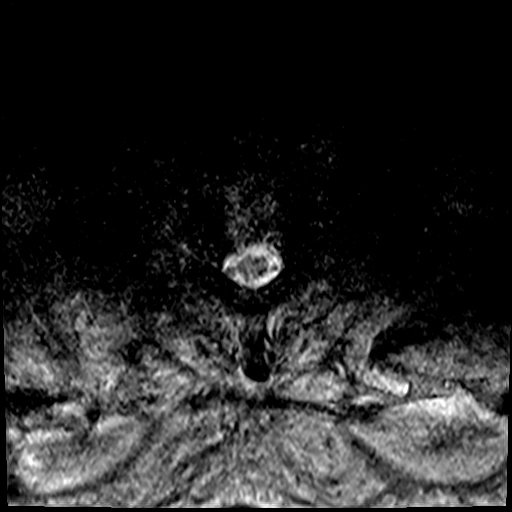
[im 23/23]
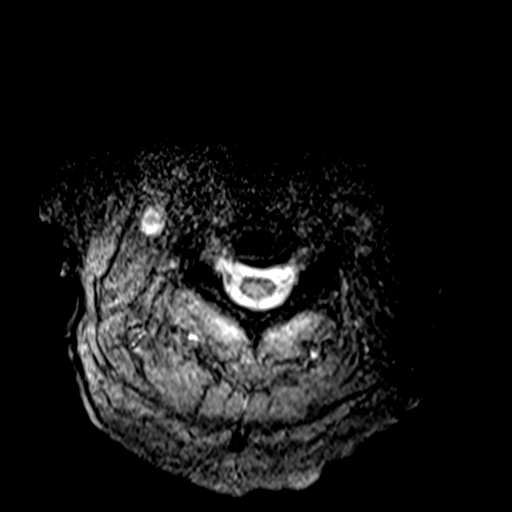

[Series 24: T1 · sagittal · 3.0mm · 0.86mm/px · 1 of 16 slices shown (3 of 3)]
[im 1/16]
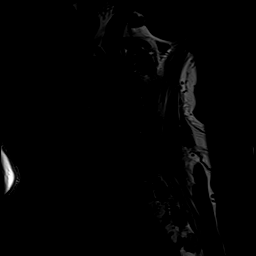

[40 of 48 positions shown; findings below may reference images not displayed]

FINDINGS: Brain: Generalized atrophy without hydrocephalus. Hyperintensity and
volume loss in the periventricular white matter right greater than
left due to chronic ischemia. Chronic basal ganglia infarcts
bilaterally. Mild chronic ischemic change in the pons.

Negative for acute infarct, hemorrhage, mass negative for
hydrocephalus

Vascular: Normal arterial flow voids.

Skull and upper cervical spine: Negative

Sinuses/Orbits: Air-fluid level in the sphenoid sinus. Remaining
paranasal sinuses clear. Negative orbit

Other: None
IMPRESSION: No acute abnormality. Atrophy and moderate chronic microvascular
ischemic changes.

## 2020-09-19 MED ORDER — HYDROXYZINE HCL 25 MG PO TABS
50.0000 mg | ORAL_TABLET | Freq: Three times a day (TID) | ORAL | Status: AC | PRN
  Administered 2020-09-19 – 2020-09-20 (×2): 50 mg via ORAL
  Filled 2020-09-19 (×2): qty 2

## 2020-09-19 MED ORDER — BISACODYL 10 MG RE SUPP: 10.0000 mg | Freq: Every day | RECTAL | Status: DC | PRN

## 2020-09-19 MED ORDER — ZINC OXIDE 40 % EX OINT
TOPICAL_OINTMENT | CUTANEOUS | Status: DC | PRN
  Administered 2020-09-20 – 2020-09-25 (×3): 1 via TOPICAL
  Filled 2020-09-19 (×2): qty 57

## 2020-09-19 MED ORDER — HALOPERIDOL LACTATE 5 MG/ML IJ SOLN
2.0000 mg | Freq: Four times a day (QID) | INTRAMUSCULAR | Status: DC | PRN
  Administered 2020-09-19 – 2020-09-23 (×2): 2 mg via INTRAVENOUS
  Filled 2020-09-19 (×2): qty 1

## 2020-09-19 NOTE — Progress Notes (Signed)
SLP Cancellation Note  Patient Details Name: Jason Hartman MRN: [id number] DOB: 1936/01/29   Cancelled treatment:       Reason Eval/Treat Not Completed: Other (comment) (pt perseverating on need to get to the bathroom although he is +2 to roll,  NT aware, will continue efforts)  Kathleen Lime, MS [place] Office [phone] Pager [phone]  [name] 09/19/2020, 6:50 PM

## 2020-09-19 NOTE — Care Management Important Message (Signed)
Important Message  Patient Details IM Letter given to the Patient. Name: CAELLUM MANCIL MRN: [id number] Date of Birth: 08/29/1935   Medicare Important Message Given:  Yes     Kerin Salen 09/19/2020, 1:02 PM

## 2020-09-19 NOTE — Progress Notes (Addendum)
[facility] PROGRESS NOTE   Jason Hartman PPI:[id number] DOB: 1936-02-12 DOA: 09/08/2020  PCP: [name], MD  Brief History/Interval Summary: 85 y.o. male with medical history significant for OSA, Parkinson's, restless leg syndrome, anxiety, chronic pain, chronic kidney disease, and insulin-dependent diabetes mellitus, who presented to the emergency department for evaluation of anxiety and diaphoresis.  Patient has had a lot of medication changes recently including discontinuation of alprazolam and initiation of tramadol Celexa BuSpar.  During the course of this hospital stay has become very encephalopathic.  Neurology was consulted.  He had to be moved to the stepdown unit.  Subsequently found to have a UTI and bladder outlet obstruction requiring involvement of urology and a Foley catheter placement.  Became more encephalopathic.  Now there is concern for possible acute stroke.  Reason for Visit: Acute metabolic encephalopathy  Consultants:  Neurology Pulmonology Cardiology Urology  Procedures:   EEG did not show any epileptiform or seizure activity  Antibiotics: Anti-infectives (From admission, onward)    Start     Dose/Rate Route Frequency Ordered Stop   09/18/20 1000  Ampicillin-Sulbactam (UNASYN) 3 g in sodium chloride 0.9 % 100 mL IVPB        3 g 200 mL/hr over 30 Minutes Intravenous Every 6 hours 09/18/20 0809     09/15/20 1000  Ampicillin-Sulbactam (UNASYN) 3 g in sodium chloride 0.9 % 100 mL IVPB  Status:  Discontinued        3 g 200 mL/hr over 30 Minutes Intravenous Every 8 hours 09/15/20 0821 09/18/20 0809   09/13/20 0000  cefTRIAXone (ROCEPHIN) 1 g in sodium chloride 0.9 % 100 mL IVPB  Status:  Discontinued        1 g 200 mL/hr over 30 Minutes Intravenous Every 24 hours 09/12/20 2339 09/15/20 0809   09/09/20 0115  aztreonam (AZACTAM) 1 g in sodium chloride 0.9 % 100 mL IVPB        1 g 200 mL/hr over 30 Minutes Intravenous  Once 09/09/20 0106 09/09/20 0307        Subjective/Interval History: Patient continues to be more awake and alert.  He states that he is hungry.  Also states that he wants to go home.  Remains distracted however.     Assessment/Plan:  Acute metabolic encephalopathy Differential diagnosis has been broad including polypharmacy, withdrawal, serotonin syndrome which has been less likely.  UTI could also be contributing. LP was considered however it appears that his wife did not want him to undergo this test.  Meningitis was thought to be less likely so LP was not pursued. CT head shows chronic changes without any acute findings. EEG did not show any epileptiform activity. TSH 1.09 with free T4 of 1.3. Cortisol level 35.5.  A63 was normal.  Folic acid ammonia levels were normal. UA was noted to be abnormal.  Patient was complaining of dysuria and had suprapubic tenderness.  Started on antibiotics. Patient was requiring Precedex infusion which has been discontinued.   Neurology has signed off.   Patient subsequently found to have significantly distended urinary bladder which was likely reason for his pain and agitation.  Agitation subsided once a Foley catheter was placed by urology on 6/10. Patient remained lethargic for quite a few days.  CT head was repeated which did not show any acute findings.  No obvious focal deficits were noted at that time. Over the last 48 hours patient mentation has improved.  It was noticed yesterday that he was not moving his left  arm as well as the right arm.  No facial asymmetry noted.  Its unclear as to the reason for this weakness.  But stroke is a possibility.  MRI brain has been ordered.  MRI cervical spine also ordered.  There is some issues with claustrophobia so Valium was ordered.  If patient is unable to tolerate MRI then will discuss with family as to further steps.  Bladder outlet obstruction/history of urethral strictures CT scan raised concern for distention of the urinary bladder.   This is concerning for some form of bladder outlet obstruction.  Likely reason for his suprapubic tenderness and pain issues. Attempts to place Foley by nursing staff were unsuccessful.  Urology was subsequently consulted.  After using multiple dilators a catheter was placed successfully. Discussed with wife.  Patient follows with a urologist in [facility] and has a known history of urethral strictures and sees a urologist multiple times during the year and undergoes cystoscopy every so often. Seems to be stable.  Foley catheter in place.  Will need to be discharged with Foley catheter. Will need outpatient follow-up with his urologist.  Transient hypoxia Hypoxia was primarily due to alteration in mental status and lethargy.  Chest x-ray did not show any obvious abnormalities except for atelectasis.   Now he seems to be saturating normal on room air.  Urinary tract infection with Enterococcus UA was noted to be abnormal.  Patient was placed on ceftriaxone.  Urine cultures grew greater than 100,000 colonies of Enterococcus and less than 100,000 of Acinetobacter. Patient was changed over to Unasyn on 6/10.  Change to oral when oral intake is more consistent.  We will plan for 7 to 10-day course.  Essential hypertension/transient hypotension Initially hypotensive requiring Levophed.   Subsequently hypertensive with systolics greater than 235 likely due to retention of urine, bladder distention and pain.   After placement of Foley catheter blood pressures have improved.  Blood pressure did decrease significantly so ARB was stopped.   Blood pressure stable the last 48 hours.  Mild rhabdomyolysis CK level improved from 2578 to 493.    Atrial flutter, new diagnosis Echocardiogram showed normal EF.  Cardiac status appears to be stable on amiodarone and metoprolol.   Due to altered mentation he was at high risk for aspiration and was NPO.  Eliquis was changed to Lovenox.   Continue Lovenox for now  until his oral intake is more consistent.  Also on scheduled IV metoprolol. Hopefully transition back to oral agents in the next 24 to 48 hours.  Acute kidney injury on chronic kidney disease stage IIIa/hypernatremia Rise in creatinine was noted likely due to poor oral intake.  BUN was also elevated.  He was started on IV fluids with improvement in renal function noted.  Monitor urine output.  Sodium level improved.    Insulin-dependent diabetes mellitus HbA1c was 6.5 in May.  CBGs are elevated at times.  He remains on SSI and low-dose Lantus.  Staph hominis bacteremia Thought to be contaminant.  Repeat cultures were negative.  Obstructive sleep apnea CPAP nightly.  Elevated D-dimer Lower extremity Doppler studies negative.  VQ scan low probability.  History of parkinsonism's Noted to be on Rotigotine patch.  History of anxiety Recently stopped Xanax and benzodiazepine and so withdrawal was considered during earlier part of this admission.   Apparently has been taking high doses of gabapentin which is currently on hold.  Will resume at a lower dose once he is more awake and alert.  Valium was  discontinued due to lethargy.  History of restless leg syndrome Gabapentin is on hold as discussed above.  Obesity Estimated body mass index is 32.51 kg/m as calculated from the following:   Height as of this encounter: 5\' 4"  (1.626 m).   Weight as of this encounter: 85.9 kg.  Goals of care Family has been updated.  Discussion was held with patient's wife as well as his brother who is a Engineer, drilling in [place].  Continue full CODE STATUS for now.    DVT Prophylaxis: Apixaban Code Status: Full code Family Communication: Wife will be updated today once we have results of MRI. Voicemail left for wife. Disposition Plan: May need to go to rehab.  PT and OT evaluation.  Status is: Inpatient  Remains inpatient appropriate because:Altered mental status and IV treatments appropriate due to  intensity of illness or inability to take PO   Dispo: The patient is from: Home              Anticipated d/c is to: SNF              Patient currently is not medically stable to d/c.   Difficult to place patient No       Medications:  Scheduled:  amiodarone  200 mg Oral Daily   enoxaparin (LOVENOX) injection  1 mg/kg Subcutaneous Q12H   hydrocortisone cream   Topical BID   insulin aspart  0-9 Units Subcutaneous Q4H   insulin glargine  5 Units Subcutaneous Daily   lidocaine  1 patch Transdermal Q24H   mouth rinse  15 mL Mouth Rinse BID   metoprolol tartrate  2.5 mg Intravenous Q6H   Rotigotine  1 patch Transdermal QPM   thiamine  100 mg Oral Daily   Continuous:  sodium chloride 75 mL/hr at 09/18/20 1629   ampicillin-sulbactam (UNASYN) IV 3 g (09/19/20 0532)   ZDG:[id number] **OR** acetaminophen, diphenhydrAMINE-zinc acetate, hydrALAZINE, naLOXone (NARCAN)  injection, ofloxacin, ondansetron **OR** ondansetron (ZOFRAN) IV, senna-docusate, technetium albumin aggregated   Objective:  Vital Signs  Vitals:   09/18/20 2022 09/19/20 0018 09/19/20 0500 09/19/20 0514  BP: 137/68 140/72  127/67  Pulse: 68 66  67  Resp: 18     Temp: 97.6 F (36.4 C)   (!) 97.5 F (36.4 C)  TempSrc: Oral   Oral  SpO2: 99%   98%  Weight:   85.9 kg   Height:        Intake/Output Summary (Last 24 hours) at 09/19/2020 1003 Last data filed at 09/19/2020 0653 Gross per 24 hour  Intake 3443.97 ml  Output 2750 ml  Net 693.97 ml    Filed Weights   09/17/20 0420 09/18/20 0500 09/19/20 0500  Weight: 80.5 kg 81.8 kg 85.9 kg    General appearance: Much more awake and alert today compared to the last few days. Resp: Improved effort noted.  Continues to have diminished air entry at the bases.  No wheezing or rhonchi. Cardio: S1-S2 is normal regular.  No S3-S4.  No rubs murmurs or bruit GI: Abdomen is soft.  Nontender nondistended.  Bowel sounds are present normal.  No masses  organomegaly Extremities: No edema.  Physical deconditioning is noted Neurologic: Remains distracted.  No facial asymmetry.  Continues to have slight weakness of the left upper extremity.  Neurological examination is limited due to his altered mentation.       Lab Results:  Data Reviewed: I have personally reviewed following labs and imaging studies  CBC: Recent Labs  Lab 09/14/20  9767 09/15/20 0300 09/16/20 0239 09/18/20 0510 09/19/20 0503  WBC 8.9 15.5* 15.6* 12.0* 8.5  HGB 14.7 17.8* 17.6* 16.4 15.0  HCT 47.4 55.7* 56.4* 55.0* 49.1  MCV 91.9 89.4 91.7 95.8 94.1  PLT 125* 159 191 162 154     Basic Metabolic Panel: Recent Labs  Lab 09/15/20 0300 09/16/20 0239 09/17/20 0321 09/18/20 0510 09/19/20 0503  NA 135 141 146* 146* 139  K 4.0 4.2 4.5 4.1 3.9  CL 101 107 108 115* 106  CO2 22 23 24 25 26   GLUCOSE 224* 213* 209* 159* 117*  BUN 21 42* 70* 71* 47*  CREATININE 1.05 1.34* 1.91* 1.36* 1.00  CALCIUM 9.2 9.1 9.0 8.6* 8.2*     GFR: Estimated Creatinine Clearance: 56.3 mL/min (by C-G formula based on SCr of 1 mg/dL).  Liver Function Tests: Recent Labs  Lab 09/13/20 0511  AST 34  ALT 35  ALKPHOS 46  BILITOT 0.9  PROT 5.4*  ALBUMIN 3.0*        CBG: Recent Labs  Lab 09/18/20 1631 09/18/20 2010 09/18/20 2358 09/19/20 0505 09/19/20 0735  GLUCAP 297* 270* 159* 107* 103*       Recent Results (from the past 240 hour(s))  MRSA PCR Screening     Status: Abnormal   Collection Time: 09/10/20  5:19 AM   Specimen: Nasal Mucosa; Nasopharyngeal  Result Value Ref Range Status   MRSA by PCR POSITIVE (A) NEGATIVE Final    Comment:        The GeneXpert MRSA Assay (FDA approved for NASAL specimens only), is one component of a comprehensive MRSA colonization surveillance program. It is not intended to diagnose MRSA infection nor to guide or monitor treatment for MRSA infections. RESULT CALLED TO, READ BACK BY AND VERIFIED WITH: HEAVNER,A. RN AT  1150 09/10/20 MULLINS,T Performed at [facility], [place] [address] [place], [place] [address]   Culture, blood (routine x 2)     Status: None   Collection Time: 09/10/20 11:03 AM   Specimen: BLOOD  Result Value Ref Range Status   Specimen Description   Final    BLOOD LEFT FOREARM Performed at [facility] [address], [place], [place] [address]    Special Requests   Final    BOTTLES DRAWN AEROBIC ONLY Blood Culture results may not be optimal due to an inadequate volume of blood received in culture bottles Performed at [facility] [address] [place], [place] [address]    Culture   Final    NO GROWTH 5 DAYS Performed at [facility], [place] [address], [place], [place] [address]    Report Status 09/15/2020 FINAL  Final  Culture, blood (routine x 2)     Status: None   Collection Time: 09/10/20 11:03 AM   Specimen: BLOOD  Result Value Ref Range Status   Specimen Description   Final    BLOOD RIGHT ANTECUBITAL Performed at [facility] [address], [place], [place] [address]    Special Requests   Final    BOTTLES DRAWN AEROBIC AND ANAEROBIC Blood Culture adequate volume Performed at [facility] [address], [place], [place] [address]    Culture   Final    NO GROWTH 5 DAYS Performed at [facility], [place] [address], [place], [place] [address]    Report Status 09/15/2020 FINAL  Final  Culture, Urine     Status: Abnormal   Collection  Time: 09/12/20 11:39 PM   Specimen: Urine, Clean Catch  Result Value Ref Range Status   Specimen Description   Final    URINE, CLEAN CATCH Performed at [facility], [place] [address], [place], [place] [address]    Special Requests   Final    NONE Performed at [facility], [place] [address] [place], [place] [address]    Culture (A)  Final    >=100,000 COLONIES/mL ENTEROCOCCUS FAECALIS 10,000  COLONIES/mL ACINETOBACTER CALCOACETICUS/BAUMANNII COMPLEX    Report Status 09/15/2020 FINAL  Final   Organism ID, Bacteria ENTEROCOCCUS FAECALIS (A)  Final   Organism ID, Bacteria ACINETOBACTER CALCOACETICUS/BAUMANNII COMPLEX (A)  Final      Susceptibility   Acinetobacter calcoaceticus/baumannii complex - MIC*    CEFTAZIDIME 4 SENSITIVE Sensitive     CIPROFLOXACIN <=0.25 SENSITIVE Sensitive     GENTAMICIN <=1 SENSITIVE Sensitive     IMIPENEM <=0.25 SENSITIVE Sensitive     PIP/TAZO 8 SENSITIVE Sensitive     TRIMETH/SULFA <=20 SENSITIVE Sensitive     AMPICILLIN/SULBACTAM <=2 SENSITIVE Sensitive     * 10,000 COLONIES/mL ACINETOBACTER CALCOACETICUS/BAUMANNII COMPLEX   Enterococcus faecalis - MIC*    AMPICILLIN <=2 SENSITIVE Sensitive     NITROFURANTOIN <=16 SENSITIVE Sensitive     VANCOMYCIN 1 SENSITIVE Sensitive     * >=100,000 COLONIES/mL ENTEROCOCCUS FAECALIS      Radiology Studies: No results found.      LOS: 9 days   Darnel Mchan [employer] on [email or web address]  09/19/2020, 10:03 AM

## 2020-09-20 LAB — GLUCOSE, CAPILLARY
Glucose-Capillary: 111 mg/dL — ABNORMAL HIGH (ref 70–99)
Glucose-Capillary: 115 mg/dL — ABNORMAL HIGH (ref 70–99)
Glucose-Capillary: 150 mg/dL — ABNORMAL HIGH (ref 70–99)
Glucose-Capillary: 186 mg/dL — ABNORMAL HIGH (ref 70–99)
Glucose-Capillary: 206 mg/dL — ABNORMAL HIGH (ref 70–99)
Glucose-Capillary: 250 mg/dL — ABNORMAL HIGH (ref 70–99)
Glucose-Capillary: 63 mg/dL — ABNORMAL LOW (ref 70–99)

## 2020-09-20 LAB — BASIC METABOLIC PANEL
Anion gap: 5 (ref 5–15)
BUN: 25 mg/dL — ABNORMAL HIGH (ref 8–23)
CO2: 29 mmol/L (ref 22–32)
Calcium: 8.5 mg/dL — ABNORMAL LOW (ref 8.9–10.3)
Chloride: 107 mmol/L (ref 98–111)
Creatinine, Ser: 1.07 mg/dL (ref 0.61–1.24)
GFR, Estimated: >60 mL/min (ref >60)
Glucose, Bld: 159 mg/dL — ABNORMAL HIGH (ref 70–99)
Potassium: 3.8 mmol/L (ref 3.5–5.1)
Sodium: 141 mmol/L (ref 135–145)

## 2020-09-20 LAB — VITAMIN B1: Vitamin B1 (Thiamine): 199.1 nmol/L (ref 66.5–200.0)

## 2020-09-20 NOTE — Progress Notes (Addendum)
[facility] PROGRESS NOTE   Jason Hartman:[id number] DOB: [birth year] DOA: 09/08/2020  PCP: [name], MD  Brief History/Interval Summary: 85 y.o. male with medical history significant for OSA, Parkinson's, restless leg syndrome, anxiety, chronic pain, chronic kidney disease, and insulin-dependent diabetes mellitus, who presented to the emergency department for evaluation of anxiety and diaphoresis.  Patient has had a lot of medication changes recently including discontinuation of alprazolam and initiation of tramadol Celexa BuSpar.  During the course of this hospital stay has become very encephalopathic.  Neurology was consulted.  He had to be moved to the stepdown unit.  Subsequently found to have a UTI and bladder outlet obstruction requiring involvement of urology and a Foley catheter placement.  Became more encephalopathic.  Now there is concern for possible acute stroke.  6/15 Per psychiatry patient is not suicidal, homicidal, or psychotic and does not need inpatient psychiatric care  Reason for Visit: Acute metabolic encephalopathy  Consultants:  Neurology Pulmonology Cardiology Urology Psychiatry  Procedures:   EEG did not show any epileptiform or seizure activity  Antibiotics: Anti-infectives (From admission, onward)    Start     Dose/Rate Route Frequency Ordered Stop   09/18/20 1000  Ampicillin-Sulbactam (UNASYN) 3 g in sodium chloride 0.9 % 100 mL IVPB        3 g 200 mL/hr over 30 Minutes Intravenous Every 6 hours 09/18/20 0809     09/15/20 1000  Ampicillin-Sulbactam (UNASYN) 3 g in sodium chloride 0.9 % 100 mL IVPB  Status:  Discontinued        3 g 200 mL/hr over 30 Minutes Intravenous Every 8 hours 09/15/20 0821 09/18/20 0809   09/13/20 0000  cefTRIAXone (ROCEPHIN) 1 g in sodium chloride 0.9 % 100 mL IVPB  Status:  Discontinued        1 g 200 mL/hr over 30 Minutes Intravenous Every 24 hours 09/12/20 2339 09/15/20 0809   09/09/20 0115  aztreonam  (AZACTAM) 1 g in sodium chloride 0.9 % 100 mL IVPB        1 g 200 mL/hr over 30 Minutes Intravenous  Once 09/09/20 0106 09/09/20 0307       Subjective/Interval History: States wants to get out of bed and walk. He appears oriented on answering my questions   Assessment/Plan:  Acute metabolic encephalopathy Differential diagnosis has been broad including polypharmacy, withdrawal, serotonin syndrome which has been less likely.  UTI could also be contributing. LP was considered however it appears that his wife did not want him to undergo this test.  Meningitis was thought to be less likely so LP was not pursued. CT head shows chronic changes without any acute findings. EEG did not show any epileptiform activity. TSH 1.09 with free T4 of 1.3. Cortisol level 35.5.  W11 was normal.  Folic acid ammonia levels were normal. UA was noted to be abnormal.  Patient was complaining of dysuria and had suprapubic tenderness.  Started on antibiotics. Patient was requiring Precedex infusion which has been discontinued.   Neurology has signed off.   Patient subsequently found to have significantly distended urinary bladder which was likely reason for his pain and agitation.  Agitation subsided once a Foley catheter was placed by urology on 6/10. Patient remained lethargic for quite a few days.  CT head was repeated which did not show any acute findings.  No obvious focal deficits were noted at that time. Over the last 48 hours patient mentation has improved.  It was noticed yesterday that he  was not moving his left arm as well as the right arm.  No facial asymmetry noted.  Its unclear as to the reason for this weakness.  But stroke is a possibility.  6/15- MRI brain and cervical spine without acute abn.   Cervical spondylosis.  Spinal and foraminal stenosis most prominent at C3-4 and C4-5.  No acute abnormality      Bladder outlet obstruction/history of urethral strictures CT scan raised concern for  distention of the urinary bladder.  This is concerning for some form of bladder outlet obstruction.  Likely reason for his suprapubic tenderness and pain issues. Attempts to place Foley by nursing staff were unsuccessful.  Urology was subsequently consulted.  After using multiple dilators a catheter was placed successfully. Discussed with wife.  Patient follows with a urologist in [facility] and has a known history of urethral strictures and sees a urologist multiple times during the year and undergoes cystoscopy every so often. 6/15 foley cath in place. Will need to be dc'd with foley cath  Will need outpatient f/u with his urologist.   Transient hypoxia Hypoxia was primarily due to alteration in mental status and lethargy.  Chest x-ray did not show any obvious abnormalities except for atelectasis.   6/15 currently on room air satting 99%  Urinary tract infection with Enterococcus UA was noted to be abnormal.  Patient was placed on ceftriaxone.  Urine cultures grew greater than 100,000 colonies of Enterococcus and less than 100,000 of Acinetobacter. Patient was changed over to Unasyn on 6/10.   6/15 changed to oral when oral intake is more consistent.  Will plan for 7 to 10-day course     Essential hypertension/transient hypotension Initially hypotensive requiring Levophed.   Subsequently hypertensive with systolics greater than 588 likely due to retention of urine, bladder distention and pain.   After placement of Foley catheter blood pressures have improved.  Blood pressure did decrease significantly so ARB was stopped.   Blood pressure stable the last 48 hours.  Mild rhabdomyolysis CK level improved from 2578 to 493.    Atrial flutter, new diagnosis Echocardiogram showed normal EF.  Cardiac status appears to be stable on amiodarone and metoprolol.   Due to altered mentation he was at high risk for aspiration and was NPO.  Eliquis was changed to Lovenox.   Continue Lovenox for now  until his oral intake is more consistent.  Also on scheduled IV metoprolol. Hopefully transition back to oral agents in the next 24 to 48 hours.  Acute kidney injury on chronic kidney disease stage IIIa/hypernatremia Rise in creatinine was noted likely due to poor oral intake.  BUN was also elevated.  He was started on IV fluids with improvement in renal function noted.  Monitor urine output.  Sodium level improved.    Insulin-dependent diabetes mellitus HbA1c was 6.5 in May.   Bg more stable RISS  Continue low dose lantus    Staph hominis bacteremia Thought to be contaminant.  Repeat cultures were negative.  Obstructive sleep apnea CPAP nightly.  Elevated D-dimer Lower extremity Doppler studies negative.  VQ scan low probability.  History of parkinsonism's Noted to be on Rotigotine patch.  History of anxiety Recently stopped Xanax and benzodiazepine and so withdrawal was considered during earlier part of this admission.   Apparently has been taking high doses of gabapentin which is currently on hold.  Will resume at a lower dose once he is more awake and alert.  Valium was discontinued due to lethargy.  History of restless leg syndrome Gabapentin is on hold as discussed above.  Obesity Estimated body mass index is 31.64 kg/m as calculated from the following:   Height as of this encounter: 5\' 4"  (1.626 m).   Weight as of this encounter: 83.6 kg.  Goals of care Family has been updated.  Discussion was held with patient's wife as well as his brother who is a Engineer, drilling in [place].  Continue full CODE STATUS for now.    DVT Prophylaxis: lovenox Code Status: Full code Family Communication: Wife was updated Disposition Plan: May need to go to rehab.  PT and OT evaluation.  Status is: Inpatient  Remains inpatient appropriate because:Altered mental status and IV treatments appropriate due to intensity of illness or inability to take PO   Dispo: The patient is from: Home               Anticipated d/c is to: SNF              Patient currently is not medically stable to d/c.   Difficult to place patient No   Time spent 45 minutes with more than 50% on COC    Medications:  Scheduled:  amiodarone  200 mg Oral Daily   enoxaparin (LOVENOX) injection  1 mg/kg Subcutaneous Q12H   hydrocortisone cream   Topical BID   insulin aspart  0-9 Units Subcutaneous Q4H   insulin glargine  5 Units Subcutaneous Daily   lidocaine  1 patch Transdermal Q24H   mouth rinse  15 mL Mouth Rinse BID   metoprolol tartrate  2.5 mg Intravenous Q6H   Rotigotine  1 patch Transdermal QPM   thiamine  100 mg Oral Daily   Continuous:  sodium chloride 50 mL/hr at 09/20/20 0020   ampicillin-sulbactam (UNASYN) IV 3 g (09/20/20 1056)   [id number] **OR** acetaminophen, bisacodyl, diphenhydrAMINE-zinc acetate, haloperidol lactate, hydrALAZINE, hydrOXYzine, liver oil-zinc oxide, naLOXone (NARCAN)  injection, ofloxacin, ondansetron **OR** ondansetron (ZOFRAN) IV, senna-docusate, technetium albumin aggregated   Objective:  Vital Signs  Vitals:   09/19/20 2010 09/20/20 0600 09/20/20 0630 09/20/20 1245  BP: 136/64  136/67 (!) 135/58  Pulse: 70  65 89  Resp: 16  (!) 22 18  Temp: (!) 97.5 F (36.4 C)  97.9 F (36.6 C) 98.5 F (36.9 C)  TempSrc: Oral   Oral  SpO2: 100%  99% 99%  Weight:  83.6 kg    Height:        Intake/Output Summary (Last 24 hours) at 09/20/2020 1431 Last data filed at 09/20/2020 1230 Gross per 24 hour  Intake 2883.89 ml  Output 5400 ml  Net -2516.11 ml   Filed Weights   09/18/20 0500 09/19/20 0500 09/20/20 0600  Weight: 81.8 kg 85.9 kg 83.6 kg   Nad, calm More awake, talking. Cta no w/r/r Reguar s1/s2 no gallop Soft benign +bs No edema Awake and alert Mood and affect appropriate in current setting      Lab Results:  Data Reviewed: I have personally reviewed following labs and imaging studies  CBC: Recent Labs  Lab 09/14/20 0254  09/15/20 0300 09/16/20 0239 09/18/20 0510 09/19/20 0503  WBC 8.9 15.5* 15.6* 12.0* 8.5  HGB 14.7 17.8* 17.6* 16.4 15.0  HCT 47.4 55.7* 56.4* 55.0* 49.1  MCV 91.9 89.4 91.7 95.8 94.1  PLT 125* 159 191 162 466    Basic Metabolic Panel: Recent Labs  Lab 09/16/20 0239 09/17/20 0321 09/18/20 0510 09/19/20 0503 09/20/20 0926  NA 141 146* 146* 139 141  K 4.2 4.5 4.1 3.9 3.8  CL 107 108 115* 106 107  CO2 23 24 25 26 29   GLUCOSE 213* 209* 159* 117* 159*  BUN 42* 70* 71* 47* 25*  CREATININE 1.34* 1.91* 1.36* 1.00 1.07  CALCIUM 9.1 9.0 8.6* 8.2* 8.5*    GFR: Estimated Creatinine Clearance: 51.9 mL/min (by C-G formula based on SCr of 1.07 mg/dL).  Liver Function Tests: No results for input(s): AST, ALT, ALKPHOS, BILITOT, PROT, ALBUMIN in the last 168 hours.     CBG: Recent Labs  Lab 09/19/20 2326 09/20/20 0332 09/20/20 0416 09/20/20 0724 09/20/20 1157  GLUCAP 172* 63* 111* 115* 150*      Recent Results (from the past 240 hour(s))  Culture, Urine     Status: Abnormal   Collection Time: 09/12/20 11:39 PM   Specimen: Urine, Clean Catch  Result Value Ref Range Status   Specimen Description   Final    URINE, CLEAN CATCH Performed at [facility], [place] [address], [place], [place] [address]    Special Requests   Final    NONE Performed at [facility], [place] [address], [place],  [address]    Culture (A)  Final    >=100,000 COLONIES/mL ENTEROCOCCUS FAECALIS 10,000 COLONIES/mL ACINETOBACTER CALCOACETICUS/BAUMANNII COMPLEX    Report Status 09/15/2020 FINAL  Final   Organism ID, Bacteria ENTEROCOCCUS FAECALIS (A)  Final   Organism ID, Bacteria ACINETOBACTER CALCOACETICUS/BAUMANNII COMPLEX (A)  Final      Susceptibility   Acinetobacter calcoaceticus/baumannii complex - MIC*    CEFTAZIDIME 4 SENSITIVE Sensitive     CIPROFLOXACIN <=0.25 SENSITIVE Sensitive     GENTAMICIN <=1 SENSITIVE Sensitive     IMIPENEM <=0.25  SENSITIVE Sensitive     PIP/TAZO 8 SENSITIVE Sensitive     TRIMETH/SULFA <=20 SENSITIVE Sensitive     AMPICILLIN/SULBACTAM <=2 SENSITIVE Sensitive     * 10,000 COLONIES/mL ACINETOBACTER CALCOACETICUS/BAUMANNII COMPLEX   Enterococcus faecalis - MIC*    AMPICILLIN <=2 SENSITIVE Sensitive     NITROFURANTOIN <=16 SENSITIVE Sensitive     VANCOMYCIN 1 SENSITIVE Sensitive     * >=100,000 COLONIES/mL ENTEROCOCCUS FAECALIS      Radiology Studies: MR BRAIN WO CONTRAST  Result Date: 09/19/2020 CLINICAL DATA:  Acute neuro deficit EXAM: MRI HEAD WITHOUT CONTRAST TECHNIQUE: Multiplanar, multiecho pulse sequences of the brain and surrounding structures were obtained without intravenous contrast. COMPARISON:  CT head 09/16/2020 FINDINGS: Brain: Generalized atrophy without hydrocephalus. Hyperintensity and volume loss in the periventricular white matter right greater than left due to chronic ischemia. Chronic basal ganglia infarcts bilaterally. Mild chronic ischemic change in the pons. Negative for acute infarct, hemorrhage, mass negative for hydrocephalus Vascular: Normal arterial flow voids. Skull and upper cervical spine: Negative Sinuses/Orbits: Air-fluid level in the sphenoid sinus. Remaining paranasal sinuses clear. Negative orbit Other: None IMPRESSION: No acute abnormality. Atrophy and moderate chronic microvascular ischemic changes. Electronically Signed   By: Franchot Gallo M.D.   On: 09/19/2020 10:03   MR CERVICAL SPINE WO CONTRAST  Result Date: 09/19/2020 CLINICAL DATA:  Myelopathy. EXAM: MRI CERVICAL SPINE WITHOUT CONTRAST TECHNIQUE: Multiplanar, multisequence MR imaging of the cervical spine was performed. No intravenous contrast was administered. COMPARISON:  None. FINDINGS: Alignment: Mild retrolisthesis C4-5.  Otherwise normal alignment Vertebrae: Negative for fracture or mass. Mild bone marrow edema on the left involving the superior endplate of C5 likely hemangioma. This is intermediate signal  on T1. No priors for comparison. Small area of fatty marrow on the left C4.  Possible hemangioma. Cord: Cord evaluation limited by motion. No cord signal abnormality identified. Posterior Fossa, vertebral arteries, paraspinal tissues: Negative Disc levels: C2-3: Negative C3-4: Moderate disc degeneration with disc space narrowing and diffuse uncinate spurring. Mild spinal stenosis. Moderate foraminal stenosis bilaterally due to spurring C4-5: Moderate disc degeneration with disc space narrowing and diffuse uncinate spurring. Mild spinal stenosis. Moderate foraminal stenosis bilaterally due to spurring. C6-7: Disc degeneration with diffuse uncinate spurring. Mild foraminal narrowing bilaterally C6-7: Disc degeneration with diffuse uncinate spurring. Mild foraminal narrowing bilaterally. C7-T1: Negative for stenosis. IMPRESSION: Cervical spondylosis. Spinal and foraminal stenosis most prominent C3-4 and C4-5. No acute abnormality. Electronically Signed   By: Franchot Gallo M.D.   On: 09/19/2020 10:08        LOS: 10 days   Kristofor Michalowski  [facility] Pager on [email or web address]  09/20/2020, 2:31 PM

## 2020-09-20 NOTE — Plan of Care (Signed)

## 2020-09-20 NOTE — Consult Note (Signed)
[facility] Face-to-Face Psychiatry Consult   Reason for Consult: Persistent altered mental status and patient with longstanding anxiety issues.  Neuro Recommend a psych to see Referring Physician: Dr. Maryland Pink Patient Identification: Jason Hartman MRN:  [id number] Principal Diagnosis: Anxiety Diagnosis:  Principal Problem:   Anxiety Active Problems:   Type II diabetes mellitus (HCC)   OBSTRUCTIVE SLEEP APNEA   Parkinsonism (HCC)   Chronic kidney disease, stage 3a (HCC)   Malignant HTN with heart disease, w/o CHF, w/o chronic kidney disease   Elevated troponin   SIRS (systemic inflammatory response syndrome) (HCC)   Acute metabolic encephalopathy   AMS (altered mental status)   PAF (paroxysmal atrial fibrillation) ([place])   Total Time spent with patient: 45 minutes  Subjective:   Jason Hartman is a 85 y.o. male patient admitted with altered mental status that required him being moved to stepdown unit.  On original presentation patient had acute metabolic encephalopathy with multifactorial etiology to include polypharmacy, withdrawal, serotonin syndrome, urinary tract infection.  Patient is seen and assessed by this nurse practitioner.  He is alert and oriented x2, calm and cooperative, and willingness to engage.  Patient is able to read, and comprehend, he is exhibiting logical thought processes with linear conversation.  He appears to have some confusion although very mild, when telling me about his history.  He reports longstanding history of anxiety, that is primarily due to his restless leg syndrome.  He makes mention to state " My restless legs are not painful they just want to stop moving.  They have been giving me things to use off label, that has been making me worse."  He is unable to recall his outpatient psychiatric provider, although chart review indicates Jason Hartman has been managing this patient for quite some time.  Patient denies any recent or current suicidal thoughts.  He denies  any current substance abuse.  He states he receives assistance from his wife with his medication, and reports daily compliance.  Patient does not appear to be exhibiting delusional thinking, responding to internal stimuli, or external stimuli.  Patient is able to answer all questions appropriately, and appears stable to be psychiatrically cleared with outpatient follow-up.    HPI:  85 y.o. male with medical history significant for OSA, Parkinson's, restless leg syndrome, anxiety, chronic pain, chronic kidney disease, and insulin-dependent diabetes mellitus, who presented to the emergency department for evaluation of anxiety and diaphoresis.  Patient has had a lot of medication changes recently including discontinuation of alprazolam and initiation of tramadol Celexa BuSpar.  During the course of this hospital stay has become very encephalopathic.  Neurology was consulted.  He had to be moved to the stepdown unit.  Subsequently found to have a UTI and bladder outlet obstruction requiring involvement of urology and a Foley catheter placement.  Became more encephalopathic.  Now there is concern for possible acute stroke.  Past Psychiatric History: Anxiety.   Risk to Self:  no Risk to Others:  no Prior Inpatient Therapy:  no Prior Outpatient Therapy:  Jason Miller love, PA-C [facility]  Past Medical History:  Past Medical History:  Diagnosis Date   Cellulitis and abscess of leg, except foot    Hypertension    Obstructive sleep apnea (adult) (pediatric)    Pain in joint, pelvic region and thigh    Pneumonia, organism unspecified(486)    Restless legs syndrome (RLS)    RLS (restless legs syndrome) 09/01/2012   Thoracic or lumbosacral neuritis or radiculitis, unspecified  Type II or unspecified type diabetes mellitus without mention of complication, not stated as uncontrolled    Unspecified disease of pericardium    Unspecified hereditary and idiopathic peripheral neuropathy     Past Surgical History:   Procedure Laterality Date   ANAL FISSURE REPAIR     pericarditis  2009   Family History:  Family History  Problem Relation Age of Onset   Diabetes Father    Family Psychiatric  History: Denies Social History:  Social History   Substance and Sexual Activity  Alcohol Use No     Social History   Substance and Sexual Activity  Drug Use No    Social History   Socioeconomic History   Marital status: Married    Spouse name: Jason Hartman   Number of children: 2   Years of education: College   Highest education level: Not on file  Occupational History   Occupation: Marine scientist: [employer]  Tobacco Use   Smoking status: Former    Pack years: 0.00    Types: Cigarettes    Quit date: 09/01/1980    Years since quitting: 40.0   Smokeless tobacco: Never  Vaping Use   Vaping Use: Never used  Substance and Sexual Activity   Alcohol use: No   Drug use: No   Sexual activity: Not on file  Other Topics Concern   Not on file  Social History Narrative   Patient lives at home with spouse.   Caffeine Use: occasionally   Social Determinants of Health   Financial Resource Strain: Not on file  Food Insecurity: Not on file  Transportation Needs: Not on file  Physical Activity: Not on file  Stress: Not on file  Social Connections: Not on file   Additional Social History:    Allergies:   Allergies  Allergen Reactions   Oxycodone Anaphylaxis    Other reaction(s): Other (See Comments) Cannot recall specifics   Iodinated Diagnostic Agents Hives    alleric to renografin,isovue & omnipaque, hives, requires 13 hr prep//a.calhoun, Onset Date: [id number]      Iodine Hives and Rash    alleric to renografin,isovue & omnipaque, hives, requires 13 hr prep//a.calhoun, Onset Date: [id number] allergic to renografin,isovue & omnipaque, hives, requires 13 hr prep//a.calhoun, Onset Date: [id number]   Linezolid Hives and Rash        Methadone Hcl Other (See Comments)     HALLUCINATIONS    Promethazine Other (See Comments)    Mental status change, DELIRIUM   Other reaction(s): erratic behavior Other reaction(s): Other (See Comments) Mental status change DELIRIUM (pulled out IV)   Morphine Sulfate Other (See Comments)    Pt not sure about allergy    Other Hives    Other reaction(s): erratic behanvior Other reaction(s): erratic behavior   Oxycodone Hcl Other (See Comments)    Pt not sure about allergy    Quetiapine Other (See Comments)    Pt not sure about allergy  Other reaction(s): Other (See Comments) Pt not sure about allergy  Pt not sure about allergy    Statins     Other reaction(s): muscle weakness Other reaction(s): muscle weakness   Zolpidem Other (See Comments)    Other reaction(s): erratic behanvior Other reaction(s): Delusions (intolerance) Altered mental status   Atorvastatin Itching and Other (See Comments)    Tired, weakness    Carbidopa-Levodopa Anxiety   Chlorhexidine Gluconate [Chlorhexidine] Hives and Rash   Clindamycin Rash   Colesevelam Other (See Comments)    tired  Doxazosin Rash   Lovastatin Other (See Comments)    Tired, nervousness    Rosuvastatin Other (See Comments)    Tired, weakness     Labs:  Results for orders placed or performed during the hospital encounter of 09/08/20 (from the past 48 hour(s))  Glucose, capillary     Status: Abnormal   Collection Time: 09/18/20  4:31 PM  Result Value Ref Range   Glucose-Capillary 297 (H) 70 - 99 mg/dL    Comment: Glucose reference range applies only to samples taken after fasting for at least 8 hours.  Glucose, capillary     Status: Abnormal   Collection Time: 09/18/20  8:10 PM  Result Value Ref Range   Glucose-Capillary 270 (H) 70 - 99 mg/dL    Comment: Glucose reference range applies only to samples taken after fasting for at least 8 hours.  Glucose, capillary     Status: Abnormal   Collection Time: 09/18/20 11:58 PM  Result Value Ref Range    Glucose-Capillary 159 (H) 70 - 99 mg/dL    Comment: Glucose reference range applies only to samples taken after fasting for at least 8 hours.  CBC     Status: None   Collection Time: 09/19/20  5:03 AM  Result Value Ref Range   WBC 8.5 4.0 - 10.5 K/uL   RBC 5.22 4.22 - 5.81 MIL/uL   Hemoglobin 15.0 13.0 - 17.0 g/dL   HCT 49.1 39.0 - 52.0 %   MCV 94.1 80.0 - 100.0 fL   MCH 28.7 26.0 - 34.0 pg   MCHC 30.5 30.0 - 36.0 g/dL   RDW 14.1 11.5 - 15.5 %   Platelets 154 150 - 400 K/uL   nRBC 0.0 0.0 - 0.2 %    Comment: Performed at [facility], [place] [address], [place], [place] [address]  Basic metabolic panel     Status: Abnormal   Collection Time: 09/19/20  5:03 AM  Result Value Ref Range   Sodium 139 135 - 145 mmol/L    Comment: DELTA CHECK NOTED   Potassium 3.9 3.5 - 5.1 mmol/L   Chloride 106 98 - 111 mmol/L   CO2 26 22 - 32 mmol/L   Glucose, Bld 117 (H) 70 - 99 mg/dL    Comment: Glucose reference range applies only to samples taken after fasting for at least 8 hours.   BUN 47 (H) 8 - 23 mg/dL   Creatinine, Ser 1.00 0.61 - 1.24 mg/dL   Calcium 8.2 (L) 8.9 - 10.3 mg/dL   GFR, Estimated >60 >60 mL/min    Comment: (NOTE) Calculated using the CKD-EPI Creatinine Equation (2021)    Anion gap 7 5 - 15    Comment: Performed at [facility], [address] [place], [place] [address]  Glucose, capillary     Status: Abnormal   Collection Time: 09/19/20  5:05 AM  Result Value Ref Range   Glucose-Capillary 107 (H) 70 - 99 mg/dL    Comment: Glucose reference range applies only to samples taken after fasting for at least 8 hours.  Glucose, capillary     Status: Abnormal   Collection Time: 09/19/20  7:35 AM  Result Value Ref Range   Glucose-Capillary 103 (H) 70 - 99 mg/dL    Comment: Glucose reference range applies only to samples taken after fasting for at least 8 hours.  Glucose, capillary     Status: Abnormal   Collection Time: 09/19/20 11:59 AM   Result Value Ref  Range   Glucose-Capillary 119 (H) 70 - 99 mg/dL    Comment: Glucose reference range applies only to samples taken after fasting for at least 8 hours.  Glucose, capillary     Status: Abnormal   Collection Time: 09/19/20  4:40 PM  Result Value Ref Range   Glucose-Capillary 148 (H) 70 - 99 mg/dL    Comment: Glucose reference range applies only to samples taken after fasting for at least 8 hours.  Glucose, capillary     Status: Abnormal   Collection Time: 09/19/20  8:07 PM  Result Value Ref Range   Glucose-Capillary 199 (H) 70 - 99 mg/dL    Comment: Glucose reference range applies only to samples taken after fasting for at least 8 hours.  Glucose, capillary     Status: Abnormal   Collection Time: 09/19/20 11:26 PM  Result Value Ref Range   Glucose-Capillary 172 (H) 70 - 99 mg/dL    Comment: Glucose reference range applies only to samples taken after fasting for at least 8 hours.  Glucose, capillary     Status: Abnormal   Collection Time: 09/20/20  3:32 AM  Result Value Ref Range   Glucose-Capillary 63 (L) 70 - 99 mg/dL    Comment: Glucose reference range applies only to samples taken after fasting for at least 8 hours.  Glucose, capillary     Status: Abnormal   Collection Time: 09/20/20  4:16 AM  Result Value Ref Range   Glucose-Capillary 111 (H) 70 - 99 mg/dL    Comment: Glucose reference range applies only to samples taken after fasting for at least 8 hours.  Glucose, capillary     Status: Abnormal   Collection Time: 09/20/20  7:24 AM  Result Value Ref Range   Glucose-Capillary 115 (H) 70 - 99 mg/dL    Comment: Glucose reference range applies only to samples taken after fasting for at least 8 hours.  Basic metabolic panel     Status: Abnormal   Collection Time: 09/20/20  9:26 AM  Result Value Ref Range   Sodium 141 135 - 145 mmol/L   Potassium 3.8 3.5 - 5.1 mmol/L   Chloride 107 98 - 111 mmol/L   CO2 29 22 - 32 mmol/L   Glucose, Bld 159 (H) 70 - 99 mg/dL     Comment: Glucose reference range applies only to samples taken after fasting for at least 8 hours.   BUN 25 (H) 8 - 23 mg/dL   Creatinine, Ser 1.07 0.61 - 1.24 mg/dL   Calcium 8.5 (L) 8.9 - 10.3 mg/dL   GFR, Estimated >60 >60 mL/min    Comment: (NOTE) Calculated using the CKD-EPI Creatinine Equation (2021)    Anion gap 5 5 - 15    Comment: Performed at [facility], [place] [address] [place], [place] [address]  Glucose, capillary     Status: Abnormal   Collection Time: 09/20/20 11:57 AM  Result Value Ref Range   Glucose-Capillary 150 (H) 70 - 99 mg/dL    Comment: Glucose reference range applies only to samples taken after fasting for at least 8 hours.    Current Facility-Administered Medications  Medication Dose Route Frequency Provider Last Rate Last Admin   0.45 % sodium chloride infusion   Intravenous Continuous Bonnielee Haff, MD 50 mL/hr at 09/20/20 0020 New Bag at 09/20/20 0020   acetaminophen (TYLENOL) tablet 650 mg  650 mg Oral Q6H PRN Vianne Bulls, MD   650 mg at 09/20/20 [phone]  Or   acetaminophen (TYLENOL) suppository 650 mg  650 mg Rectal Q6H PRN Opyd, Ilene Qua, MD       amiodarone (PACERONE) tablet 200 mg  200 mg Oral Daily Josue Hector, MD   200 mg at 09/20/20 0952   Ampicillin-Sulbactam (UNASYN) 3 g in sodium chloride 0.9 % 100 mL IVPB  3 g Intravenous Q6H Shade, Christine E, RPH 200 mL/hr at 09/20/20 1056 3 g at 09/20/20 1056   bisacodyl (DULCOLAX) suppository 10 mg  10 mg Rectal Daily PRN Bonnielee Haff, MD       diphenhydrAMINE-zinc acetate (BENADRYL) 2-0.1 % cream   Topical BID PRN Bonnielee Haff, MD   1 application at 61/95/09 0415   enoxaparin (LOVENOX) injection 80 mg  1 mg/kg Subcutaneous Q12H Shade, Christine E, RPH   80 mg at 09/20/20 0950   haloperidol lactate (HALDOL) injection 2 mg  2 mg Intravenous Q6H PRN Bonnielee Haff, MD   2 mg at 09/19/20 1601   hydrALAZINE (APRESOLINE) injection 10 mg  10 mg Intravenous Q6H PRN Margaretha Seeds, MD   10 mg at 09/15/20 3267   hydrocortisone cream 1 %   Topical BID Bonnielee Haff, MD   Given at 09/20/20 0955   hydrOXYzine (ATARAX/VISTARIL) tablet 50 mg  50 mg Oral TID PRN Lang Snow, FNP   50 mg at 09/19/20 2133   insulin aspart (novoLOG) injection 0-9 Units  0-9 Units Subcutaneous Q4H Lavina Hamman, MD   1 Units at 09/20/20 1412   insulin glargine (LANTUS) injection 5 Units  5 Units Subcutaneous Daily Bonnielee Haff, MD   5 Units at 09/20/20 0950   lidocaine (LIDODERM) 5 % 1 patch  1 patch Transdermal Q24H Zierle-Ghosh, Asia B, DO   1 patch at 09/20/20 0011   liver oil-zinc oxide (DESITIN) 40 % ointment   Topical PRN Bonnielee Haff, MD   1 application at 12/45/80 0955   MEDLINE mouth rinse  15 mL Mouth Rinse BID Elodia Florence., MD   15 mL at 09/19/20 1024   metoprolol tartrate (LOPRESSOR) injection 2.5 mg  2.5 mg Intravenous Q6H Bonnielee Haff, MD   2.5 mg at 09/20/20 1412   naloxone (NARCAN) injection 0.4 mg  0.4 mg Intravenous PRN Elodia Florence., MD       ofloxacin (OCUFLOX) 0.3 % ophthalmic solution 1 drop  1 drop Both Eyes QID PRN Elodia Florence., MD       ondansetron [facility]) tablet 4 mg  4 mg Oral Q6H PRN Opyd, Ilene Qua, MD       Or   ondansetron (ZOFRAN) injection 4 mg  4 mg Intravenous Q6H PRN Opyd, Ilene Qua, MD       Rotigotine PT24 1 patch  1 patch Transdermal QPM Elodia Florence., MD   1 patch at 09/19/20 1937   senna-docusate (Senokot-S) tablet 1 tablet  1 tablet Oral QHS PRN Opyd, Ilene Qua, MD   1 tablet at 09/19/20 1601   technetium albumin aggregated (MAA) injection solution 4.1 millicurie  4.1 millicurie Intravenous Once PRN Felipa Emory, MD       thiamine tablet 100 mg  100 mg Oral Daily Eudelia Bunch, RPH   100 mg at 09/20/20 9983    Musculoskeletal: Strength & Muscle Tone: within normal limits Gait & Station: normal Patient leans: N/A            Psychiatric Specialty Exam:  Presentation  General  Appearance:  Appropriate  for Environment; Casual Eye Contact: Fair Speech: Clear and Coherent; Normal Rate Speech Volume: Normal Handedness: Right  Mood and Affect  Mood: Anxious Affect: Appropriate; Congruent  Thought Process  Thought Processes: Coherent; Linear (irrelevant at times) Descriptions of Associations:Intact Orientation:Partial Thought Content:Logical; Scattered (continues to perservate about bathroom) History of Schizophrenia/Schizoaffective disorder:No data recorded Duration of Psychotic Symptoms:No data recorded Hallucinations:Hallucinations: None Ideas of Reference:None Suicidal Thoughts:Suicidal Thoughts: No Homicidal Thoughts:Homicidal Thoughts: No  Sensorium  Memory: Immediate Fair; Recent Poor; Remote Fair Judgment: Fair Insight: Shallow  Executive Functions  Concentration: Fair Attention Span: Fair Recall: [employer] of Knowledge: Fair Language: Fair  Psychomotor Activity  Psychomotor Activity: Psychomotor Activity: Normal  Assets  Assets: Communication Skills; Desire for Improvement; Financial Resources/Insurance; Housing; Talents/Skills; Resilience; Physical Health; Social Support  Sleep  Sleep: Sleep: Good  Physical Exam: Physical Exam ROS Blood pressure (!) 135/58, pulse 89, temperature 98.5 F (36.9 C), temperature source Oral, resp. rate 18, height 5\' 4"  (1.626 m), weight 83.6 kg, SpO2 99 %. Body mass index is 31.64 kg/m.  Treatment Plan Summary: Plan consider neurocognitive rehabilitation, and physical therapy once he is medically stable and ready.  -Consider delirium precautions if necessary, although he appears to be improving at this time.  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  Evidence of genitourinary problems can contribute to altered mental status and or delirium.  In addition  his cognitive impairment may lag behind complete resolution of urinary tract infection.  Disposition: No evidence of imminent risk to self or others at present.   Patient does not meet criteria for psychiatric inpatient admission. Supportive therapy provided about ongoing stressors. Discussed crisis plan, support from social network, calling 911, coming to the Emergency Department, and calling Suicide Hotline.  Suella Broad, FNP 09/20/2020 2:24 PM

## 2020-09-20 NOTE — Progress Notes (Signed)
  Speech Language Pathology Treatment: Dysphagia  Patient Details Name: Jason Hartman MRN: [id number] DOB: Dec 10, 1935 Today's Date: 09/20/2020 Time: 3664-4034 SLP Time Calculation (min) (ACUTE ONLY): 15 min  Assessment / Plan / Recommendation Clinical Impression  Pt much more alert today and willing to consume solids to determine readiness for dietary advancement.  SLP assisted pt with his meal tray and provided graham crackers, [place] sandwich to assess for readiness for advanced diet.  He fed himself using his right hand *report left is weak.  Adequate mastication however oral pocketing noted on left without pt awareness - concerning for potential trigeminal nerve involvement.  With cues he is able to clear with lingual sweep or consuming liquids.  Excellent tolerance of thin liquids - with no indication of aspiration. Recommend advance to dys3/thin - his mild dysarthria and Parkinson's impact at least his oral control and dys3 advised. Using teach back, educated pt and provided swallow precaution sign.    HPI HPI: 85 y.o. male with medical history significant for OSA, Parkinson's, restless leg syndrome, anxiety, chronic pain, chronic kidney disease, and insulin-dependent diabetes mellitus, who presented to the emergency department for evaluation of anxiety and diaphoresis.  Patient has had a lot of medication changes recently including discontinuation of alprazolam and initiation of tramadol Celexa BuSpar.  During the course of this hospital stay has become very encephalopathic, etiology unclear.  Pt imaging showed chronic bilateral basal ganglia cvas and mild chronic changes in pons. Pt underwent swallow evaluation and was started on full liquids,Today seeing pt to determine readiness for advancement.      SLP Plan  Continue with current plan of care       Recommendations  Diet recommendations: Dysphagia 3 (mechanical soft);Thin liquid Liquids provided via: Cup;Straw Medication  Administration: Whole meds with puree Supervision: Patient able to self feed Compensations: Minimize environmental distractions;Slow rate;Lingual sweep for clearance of pocketing Postural Changes and/or Swallow Maneuvers: Seated upright 90 degrees;Upright 30-60 min after meal                Oral Care Recommendations: Oral care BID;Staff/trained caregiver to provide oral care Follow up Recommendations: Other (comment) (tbd) SLP Visit Diagnosis: Dysphagia, oropharyngeal phase (R13.12) Plan: Continue with current plan of care       GO               Kathleen Lime, MS [place] Office [phone] Pager [phone]  [name] 09/20/2020, 12:14 PM

## 2020-09-21 LAB — URINE DRUGS OF ABUSE SCREEN W ALC, ROUTINE (REF LAB)
Amphetamines, Urine: NEGATIVE ng/mL
Barbiturate, Ur: NEGATIVE ng/mL
Cannabinoid Quant, Ur: NEGATIVE ng/mL
Cocaine (Metab.): NEGATIVE ng/mL
Ethanol U, Quan: NEGATIVE %
Methadone Screen, Urine: NEGATIVE ng/mL
Phencyclidine, Ur: NEGATIVE ng/mL
Propoxyphene, Urine: NEGATIVE ng/mL

## 2020-09-21 LAB — GLUCOSE, CAPILLARY
Glucose-Capillary: 118 mg/dL — ABNORMAL HIGH (ref 70–99)
Glucose-Capillary: 132 mg/dL — ABNORMAL HIGH (ref 70–99)
Glucose-Capillary: 155 mg/dL — ABNORMAL HIGH (ref 70–99)
Glucose-Capillary: 164 mg/dL — ABNORMAL HIGH (ref 70–99)
Glucose-Capillary: 174 mg/dL — ABNORMAL HIGH (ref 70–99)
Glucose-Capillary: 218 mg/dL — ABNORMAL HIGH (ref 70–99)

## 2020-09-21 LAB — DRUG PROFILE 799031
BENZODIAZEPINES: POSITIVE — AB
HYDROXYALPRAZOLAM: NEGATIVE
NORDIAZEPAM GC/MS CONF: 515 ng/mL
NORDIAZEPAM: POSITIVE — AB
Oxazepam: NEGATIVE

## 2020-09-21 LAB — OPIATES CONFIRMATION, URINE: OPIATES: NEGATIVE

## 2020-09-21 MED ORDER — SODIUM CHLORIDE 0.9 % IV SOLN
3.0000 g | Freq: Four times a day (QID) | INTRAVENOUS | Status: AC
  Administered 2020-09-21 – 2020-09-24 (×14): 3 g via INTRAVENOUS
  Filled 2020-09-21: qty 8
  Filled 2020-09-21 (×2): qty 3
  Filled 2020-09-21: qty 8
  Filled 2020-09-21 (×3): qty 3
  Filled 2020-09-21: qty 8
  Filled 2020-09-21 (×6): qty 3

## 2020-09-21 MED ORDER — DOXYLAMINE SUCCINATE (SLEEP) 25 MG PO TABS
25.0000 mg | ORAL_TABLET | Freq: Once | ORAL | Status: AC
  Administered 2020-09-21: 25 mg via ORAL
  Filled 2020-09-21: qty 1

## 2020-09-21 NOTE — TOC Progression Note (Signed)
Transition of Care [facility]) - Progression Note    Patient Details  Name: DYLYN MCLAREN MRN: [id number] Date of Birth: 1935/07/23  Transition of [facility]) CM/SW Contact  Kelise Kuch, Juliann Pulse, RN Phone Number: 09/21/2020, 3:04 PM  Clinical Narrative: Patient faxed out per spouse agreement-await bed offers, & pasrr.      Expected Discharge Plan: [place] Barriers to Discharge: Continued Medical Work up  Expected Discharge Plan and Services Expected Discharge Plan: [place]   Discharge Planning Services: CM Consult   Living arrangements for the past 2 months: Single Family Home                                       Social Determinants of Health (SDOH) Interventions    Readmission Risk Interventions No flowsheet data found.

## 2020-09-21 NOTE — NC FL2 (Signed)
[place] LEVEL OF CARE SCREENING TOOL     IDENTIFICATION  Patient Name: Jason Hartman Birthdate: 1935/04/19 Sex: male Admission Date (Current Location): 09/08/2020  [facility] and [place] Number:  Herbalist and Address:  [facility],  [place] [address], [place]      Provider Number:    Attending Physician Name and Address:  Nolberto Hanlon, MD  Relative Name and Phone Number:  Curt Bears ([phone]    Current Level of Care: Hospital Recommended Level of Care: [place] Prior Approval Number:    Date Approved/Denied:   PASRR Number:    Discharge Plan: SNF    Current Diagnoses: Patient Active Problem List   Diagnosis Date Noted   AMS (altered mental status)    PAF (paroxysmal atrial fibrillation) (HCC)    Acute metabolic encephalopathy 89/37/3428   Anxiety 09/09/2020   SIRS (systemic inflammatory response syndrome) ([place]) 09/09/2020   Diaphoresis    Slow transit constipation 08/07/2020   Abdominal aortic aneurysm without rupture ([place]) 03/17/2020   Abnormal gait 03/17/2020   Ascending aorta dilatation ([place]) 03/17/2020   Diabetic renal disease ([place]) 03/17/2020   Recurrent UTI 07/16/2019   Hypertensive urgency 04/26/2019   Malignant HTN with heart disease, w/o CHF, w/o chronic kidney disease 04/26/2019   Elevated troponin    LFTs abnormal    Statin intolerance 01/15/2019   Penis pain 04/10/2018   Urethra cancer ([place]) 02/25/2018   Lower extremity edema 06/09/2017   Peptic ulcer disease 05/16/2017   GERD (gastroesophageal reflux disease) 05/16/2017   Chronic kidney disease, stage 3a ([place]) 04/11/2017   Lower urinary tract symptoms (LUTS) 04/15/2016   Narcotic dependence ([place]) 08/31/2015   Imbalance 08/31/2015   Diabetic peripheral neuropathy ([place]) 08/31/2015   Other malaise and fatigue 05/02/2015   Chronic fatigue 05/02/2015   Panic disorder without agoraphobia 03/22/2015   Hyperlipidemia 02/03/2014    Thrombocytopenia ([place]) 08/18/2013   Parkinsonism ([place]) 08/18/2013   Other disorders of lung 08/18/2013   Organic impotence 08/18/2013   Memory loss 08/18/2013   Low back pain 08/18/2013   Iron deficiency anemia 08/18/2013   Hypercalcemia 08/18/2013   Cellulitis of right leg 08/18/2013   Atherosclerotic heart disease of native coronary artery without angina pectoris 08/18/2013   RLS (restless legs syndrome) 09/01/2012   HERPES SIMPLEX INFECTION 11/06/2006   Type II diabetes mellitus ([place]) 11/06/2006   HYPOGONADISM 11/06/2006   VITAMIN D DEFICIENCY 11/06/2006   ANXIETY 11/06/2006   OBSTRUCTIVE SLEEP APNEA 11/06/2006   RESTLESS LEG SYNDROME 11/06/2006   HYPERTENSION 11/06/2006   BENIGN PROSTATIC HYPERTROPHY 11/06/2006   ROSACEA 11/06/2006   OSTEOARTHRITIS 11/06/2006   INSOMNIA 11/06/2006   HYPERGLYCEMIA 11/06/2006   COLONIC POLYPS, HX OF 11/06/2006    Orientation RESPIRATION BLADDER Height & Weight     Self, Place  Normal Indwelling catheter Weight: 83.6 kg Height:  5\' 4"  (162.6 cm)  BEHAVIORAL SYMPTOMS/MOOD NEUROLOGICAL BOWEL NUTRITION STATUS      Continent Diet (dysphagia 3)  AMBULATORY STATUS COMMUNICATION OF NEEDS Skin   Limited Assist Verbally Normal                       Personal Care Assistance Level of Assistance  Bathing, Feeding, Dressing Bathing Assistance: Limited assistance Feeding assistance: Limited assistance Dressing Assistance: Limited assistance     Functional Limitations Info  Sight, Hearing, Speech Sight Info: Impaired Hearing Info: Adequate Speech Info: Adequate    SPECIAL CARE FACTORS FREQUENCY  PT (By licensed PT), OT (By licensed OT)     PT Frequency:  (5x week) OT Frequency:  (5x week)            Contractures Contractures Info: Not present    Additional Factors Info  Code Status, Allergies, Insulin Sliding Scale, Psychotropic Code Status Info:  (Full) Allergies Info: Oxycodone, Iodinated Diagnostic Agents, Iodine,  Linezolid, Methadone Hcl, Promethazine, Morphine Sulfate, Other, Oxycodone Hcl, Quetiapine, Statins, Zolpidem, Atorvastatin, Carbidopa-levodopa, Chlorhexidine Gluconate (Chlorhexidine), Clindamycin, Colesevelam, Doxazosin, Lovastatin, Rosuvastatin Psychotropic Info:  (haldol) Insulin Sliding Scale Info:  (SSI)       Current Medications (09/21/2020):  This is the current hospital active medication list Current Facility-Administered Medications  Medication Dose Route Frequency Provider Last Rate Last Admin   0.45 % sodium chloride infusion   Intravenous Continuous Bonnielee Haff, MD 50 mL/hr at 09/20/20 2115 New Bag at 09/20/20 2115   acetaminophen (TYLENOL) tablet 650 mg  650 mg Oral Q6H PRN Vianne Bulls, MD   650 mg at 09/20/20 2110   Or   acetaminophen (TYLENOL) suppository 650 mg  650 mg Rectal Q6H PRN Opyd, Ilene Qua, MD       amiodarone (PACERONE) tablet 200 mg  200 mg Oral Daily Josue Hector, MD   200 mg at 09/21/20 0919   Ampicillin-Sulbactam (UNASYN) 3 g in sodium chloride 0.9 % 100 mL IVPB  3 g Intravenous Q6H Amery, Gwynneth Albright, MD       bisacodyl (DULCOLAX) suppository 10 mg  10 mg Rectal Daily PRN Bonnielee Haff, MD       diphenhydrAMINE-zinc acetate (BENADRYL) 2-0.1 % cream   Topical BID PRN Bonnielee Haff, MD   1 application at 16/01/09 0415   enoxaparin (LOVENOX) injection 80 mg  1 mg/kg Subcutaneous Q12H Shade, Christine E, RPH   80 mg at 09/21/20 0917   haloperidol lactate (HALDOL) injection 2 mg  2 mg Intravenous Q6H PRN Bonnielee Haff, MD   2 mg at 09/19/20 1601   hydrALAZINE (APRESOLINE) injection 10 mg  10 mg Intravenous Q6H PRN Margaretha Seeds, MD   10 mg at 09/15/20 3235   hydrocortisone cream 1 %   Topical BID Bonnielee Haff, MD   1 application at 57/32/20 2119   insulin aspart (novoLOG) injection 0-9 Units  0-9 Units Subcutaneous Q4H Lavina Hamman, MD   1 Units at 09/21/20 0916   insulin glargine (LANTUS) injection 5 Units  5 Units Subcutaneous Daily Bonnielee Haff, MD   5 Units at 09/21/20 0918   lidocaine (LIDODERM) 5 % 1 patch  1 patch Transdermal Q24H Zierle-Ghosh, Asia B, DO   1 patch at 09/20/20 2347   liver oil-zinc oxide (DESITIN) 40 % ointment   Topical PRN Bonnielee Haff, MD   1 application at 25/42/70 1923   MEDLINE mouth rinse  15 mL Mouth Rinse BID Elodia [place]., MD   15 mL at 09/20/20 2358   metoprolol tartrate (LOPRESSOR) injection 2.5 mg  2.5 mg Intravenous Q6H Bonnielee Haff, MD   2.5 mg at 09/21/20 1256   naloxone (NARCAN) injection 0.4 mg  0.4 mg Intravenous PRN Elodia [place]., MD       ofloxacin (OCUFLOX) 0.3 % ophthalmic solution 1 drop  1 drop Both Eyes QID PRN Elodia [place]., MD       ondansetron [facility]) tablet 4 mg  4 mg Oral Q6H PRN Opyd, Ilene Qua, MD       Or   ondansetron (ZOFRAN) injection  4 mg  4 mg Intravenous Q6H PRN Opyd, Ilene Qua, MD       Rotigotine PT24 1 patch  1 patch Transdermal QPM Elodia [place]., MD   1 patch at 09/20/20 1703   senna-docusate (Senokot-S) tablet 1 tablet  1 tablet Oral QHS PRN Opyd, Ilene Qua, MD   1 tablet at 09/19/20 1601   technetium albumin aggregated (MAA) injection solution 4.1 millicurie  4.1 millicurie Intravenous Once PRN Felipa Emory, MD       thiamine tablet 100 mg  100 mg Oral Daily Eudelia Bunch, RPH   100 mg at 09/21/20 0919     Discharge Medications: Please see discharge summary for a list of discharge medications.  Relevant Imaging Results:  Relevant Lab Results:   Additional Information SS#[id number]  [place] Vanscyoc, Juliann Pulse, RN

## 2020-09-21 NOTE — Care Management (Signed)
Transition of Care (TOC) -30 day Note       Patient Details   Name: Jason Hartman  GQB:[id number]  Date of Birth:April 07, 1936     Transition of Care [facility]) CM/SW Contact   Name:Rhianne Soman Cecil  Phone Number:[phone]  Date:09/21/2020  Time:3p     MUST [id number]     To Whom it May Concern:     Please be advised that the above patient will require a short-term nursing home stay, anticipated 30 days or less rehabilitation and strengthening. The plan is for return home.

## 2020-09-21 NOTE — Progress Notes (Signed)
[facility] PROGRESS NOTE   Jason Hartman ZOX:[id number] DOB: [birth year] DOA: 09/08/2020  PCP: [name], MD  Brief History/Interval Summary: 85 y.o. male with medical history significant for OSA, Parkinson's, restless leg syndrome, anxiety, chronic pain, chronic kidney disease, and insulin-dependent diabetes mellitus, who presented to the emergency department for evaluation of anxiety and diaphoresis.  Patient has had a lot of medication changes recently including discontinuation of alprazolam and initiation of tramadol Celexa BuSpar.  During the course of this hospital stay has become very encephalopathic.  Neurology was consulted.  He had to be moved to the stepdown unit.  Subsequently found to have a UTI and bladder outlet obstruction requiring involvement of urology and a Foley catheter placement.  Became more encephalopathic.  Now there is concern for possible acute stroke.  6/15 Per psychiatry patient is not suicidal, homicidal, or psychotic and does not need inpatient psychiatric care  6/16-MS improving . Has no sob, cp, abd pain    Reason for Visit: Acute metabolic encephalopathy  Consultants:  Neurology Pulmonology Cardiology Urology Psychiatry  Procedures:   EEG did not show any epileptiform or seizure activity  Antibiotics: Anti-infectives (From admission, onward)    Start     Dose/Rate Route Frequency Ordered Stop   09/18/20 1000  Ampicillin-Sulbactam (UNASYN) 3 g in sodium chloride 0.9 % 100 mL IVPB        3 g 200 mL/hr over 30 Minutes Intravenous Every 6 hours 09/18/20 0809     09/15/20 1000  Ampicillin-Sulbactam (UNASYN) 3 g in sodium chloride 0.9 % 100 mL IVPB  Status:  Discontinued        3 g 200 mL/hr over 30 Minutes Intravenous Every 8 hours 09/15/20 0821 09/18/20 0809   09/13/20 0000  cefTRIAXone (ROCEPHIN) 1 g in sodium chloride 0.9 % 100 mL IVPB  Status:  Discontinued        1 g 200 mL/hr over 30 Minutes Intravenous Every 24 hours 09/12/20  2339 09/15/20 0809   09/09/20 0115  aztreonam (AZACTAM) 1 g in sodium chloride 0.9 % 100 mL IVPB        1 g 200 mL/hr over 30 Minutes Intravenous  Once 09/09/20 0106 09/09/20 0307       Subjective/Interval History: Pt feels tired. Denies sob, cp   Assessment/Plan:  Acute metabolic encephalopathy Differential diagnosis has been broad including polypharmacy, withdrawal, serotonin syndrome which has been less likely.  UTI could also be contributing. LP was considered however it appears that his wife did not want him to undergo this test.  Meningitis was thought to be less likely so LP was not pursued. CT head shows chronic changes without any acute findings. EEG did not show any epileptiform activity. TSH 1.09 with free T4 of 1.3. Cortisol level 35.5.  W11 was normal.  Folic acid ammonia levels were normal. UA was noted to be abnormal.  Patient was complaining of dysuria and had suprapubic tenderness.  Started on antibiotics. Patient was requiring Precedex infusion which has been discontinued.   Neurology has signed off.   Patient subsequently found to have significantly distended urinary bladder which was likely reason for his pain and agitation.  Agitation subsided once a Foley catheter was placed by urology on 6/10. Patient remained lethargic for quite a few days.  CT head was repeated which did not show any acute findings.  No obvious focal deficits were noted at that time. Over the last 48 hours patient mentation has improved.  It was noticed yesterday  that he was not moving his left arm as well as the right arm.  No facial asymmetry noted.  Its unclear as to the reason for this weakness.  But stroke is a possibility.  6/16 mental status continues to improve.  MRI of brain and cervical spine without acute abnormality.  With cervical spondylosis.  Spinal and foraminal stenosis most prominent at C3-4 and C4-5.  No acute abnormality        Bladder outlet obstruction/history of  urethral strictures CT scan raised concern for distention of the urinary bladder.  This is concerning for some form of bladder outlet obstruction.  Likely reason for his suprapubic tenderness and pain issues. Attempts to place Foley by nursing staff were unsuccessful.  Urology was subsequently consulted.  After using multiple dilators a catheter was placed successfully. Discussed with wife.  Patient follows with a urologist in [facility] and has a known history of urethral strictures and sees a urologist multiple times during the year and undergoes cystoscopy every so often. 6/16 Foley catheter in place.  Will need to be DC'd with Foley catheter  Will need outpatient follow-up with his urologist     Transient hypoxia Hypoxia was primarily due to alteration in mental status and lethargy.  Chest x-ray did not show any obvious abnormalities except for atelectasis.   6/16 currently on room air and O2 sats above 92%  Urinary tract infection with Enterococcus UA was noted to be abnormal.  Patient was placed on ceftriaxone.  Urine cultures grew greater than 100,000 colonies of Enterococcus and less than 100,000 of Acinetobacter. 6/16 patient was changed over to Unasyn to treat for 10-day course.  Today 7/10-day .     Essential hypertension/transient hypotension Initially hypotensive requiring Levophed.   Subsequently hypertensive with systolics greater than 937 likely due to retention of urine, bladder distention and pain.   After placement of Foley catheter blood pressures have improved.  Blood pressure did decrease significantly so ARB was stopped.   6/16-blood pressure stable continue to monitor  Mild rhabdomyolysis CK level improved from 2578 to 493.    Atrial flutter, new diagnosis Echocardiogram showed normal EF.  Cardiac status appears to be stable on amiodarone and metoprolol.   Due to altered mentation he was at high risk for aspiration and was NPO.  Eliquis was changed to Lovenox.    Continue Lovenox for now until his oral intake is more consistent.  Also on scheduled IV metoprolol. Hopefully transition back to oral agents in the next 24 to 48 hours.  Currently on dysphagia 3 diet.  Acute kidney injury on chronic kidney disease stage IIIa/hypernatremia Rise in creatinine was noted likely due to poor oral intake.  BUN was also elevated.  He was started on IV fluids with improvement in renal function noted.  Monitor urine output.  Sodium level improved.    Insulin-dependent diabetes mellitus HbA1c was 6.5 in May.   Bg more stable RISS  Continue low dose lantus    Staph hominis bacteremia Thought to be contaminant.  Repeat cultures were negative.  Obstructive sleep apnea CPAP nightly.  Elevated D-dimer Lower extremity Doppler studies negative.  VQ scan low probability.  History of parkinsonism's Noted to be on Rotigotine patch.  History of anxiety Recently stopped Xanax and benzodiazepine and so withdrawal was considered during earlier part of this admission.   Apparently has been taking high doses of gabapentin which is currently on hold.  Will resume at a lower dose once he is more awake  and alert.  Valium was discontinued due to lethargy.  History of restless leg syndrome Gabapentin is on hold as discussed above.  Obesity Estimated body mass index is 31.64 kg/m as calculated from the following:   Height as of this encounter: 5\' 4"  (1.626 m).   Weight as of this encounter: 83.6 kg.  Goals of care Family has been updated.  Discussion was held with patient's wife as well as his brother who is a Engineer, drilling in [place].  Continue full CODE STATUS for now.    DVT Prophylaxis: lovenox Code Status: Full code Family Communication: none at bedside Disposition Plan: SNF   Status is: Inpatient  Remains inpatient appropriate because:Altered mental status and IV treatments appropriate due to intensity of illness or inability to take PO   Dispo: The patient  is from: Home              Anticipated d/c is to: SNF              Patient currently is not medically stable to d/c.   Difficult to place patient No    Time spent more than 35 minutes with more than 50% on COC   Medications:  Scheduled:  amiodarone  200 mg Oral Daily   enoxaparin (LOVENOX) injection  1 mg/kg Subcutaneous Q12H   hydrocortisone cream   Topical BID   insulin aspart  0-9 Units Subcutaneous Q4H   insulin glargine  5 Units Subcutaneous Daily   lidocaine  1 patch Transdermal Q24H   mouth rinse  15 mL Mouth Rinse BID   metoprolol tartrate  2.5 mg Intravenous Q6H   Rotigotine  1 patch Transdermal QPM   thiamine  100 mg Oral Daily   Continuous:  sodium chloride 50 mL/hr at 09/20/20 2115   ampicillin-sulbactam (UNASYN) IV 3 g (09/21/20 0921)   XVQ:[id number] **OR** acetaminophen, bisacodyl, diphenhydrAMINE-zinc acetate, haloperidol lactate, hydrALAZINE, liver oil-zinc oxide, naLOXone (NARCAN)  injection, ofloxacin, ondansetron **OR** ondansetron (ZOFRAN) IV, senna-docusate, technetium albumin aggregated   Objective:  Vital Signs  Vitals:   09/20/20 2100 09/20/20 2304 09/21/20 0515 09/21/20 1239  BP:   132/62 121/72  Pulse:   73 84  Resp: 14 20 20 18   Temp:   98.3 F (36.8 C) 97.6 F (36.4 C)  TempSrc:    Oral  SpO2:   98% 97%  Weight:      Height:        Intake/Output Summary (Last 24 hours) at 09/21/2020 1251 Last data filed at 09/21/2020 1241 Gross per 24 hour  Intake 1060 ml  Output 2150 ml  Net -1090 ml   Filed Weights   09/18/20 0500 09/19/20 0500 09/20/20 0600  Weight: 81.8 kg 85.9 kg 83.6 kg   Nad, calm Cta no w/r/r Reg s1/s2 no gallop Soft benign +bs No edema  Aaxox3, grossly intact Mood and affect appropriate in current setting      Lab Results:  Data Reviewed: I have personally reviewed following labs and imaging studies  CBC: Recent Labs  Lab 09/15/20 0300 09/16/20 0239 09/18/20 0510 09/19/20 0503  WBC 15.5* 15.6*  12.0* 8.5  HGB 17.8* 17.6* 16.4 15.0  HCT 55.7* 56.4* 55.0* 49.1  MCV 89.4 91.7 95.8 94.1  PLT 159 191 162 671    Basic Metabolic Panel: Recent Labs  Lab 09/16/20 0239 09/17/20 0321 09/18/20 0510 09/19/20 0503 09/20/20 0926  NA 141 146* 146* 139 141  K 4.2 4.5 4.1 3.9 3.8  CL 107 108 115* 106 107  CO2 23 24 25 26 29   GLUCOSE 213* 209* 159* 117* 159*  BUN 42* 70* 71* 47* 25*  CREATININE 1.34* 1.91* 1.36* 1.00 1.07  CALCIUM 9.1 9.0 8.6* 8.2* 8.5*    GFR: Estimated Creatinine Clearance: 51.9 mL/min (by C-G formula based on SCr of 1.07 mg/dL).  Liver Function Tests: No results for input(s): AST, ALT, ALKPHOS, BILITOT, PROT, ALBUMIN in the last 168 hours.     CBG: Recent Labs  Lab 09/20/20 2008 09/20/20 2343 09/21/20 0328 09/21/20 0745 09/21/20 1136  GLUCAP 250* 186* 174* 132* 118*      Recent Results (from the past 240 hour(s))  Culture, Urine     Status: Abnormal   Collection Time: 09/12/20 11:39 PM   Specimen: Urine, Clean Catch  Result Value Ref Range Status   Specimen Description   Final    URINE, CLEAN CATCH Performed at [facility], [place] [address] [place], [place] [address]    Special Requests   Final    NONE Performed at [facility] [place] [address], [place], [place] [address]    Culture (A)  Final    >=100,000 COLONIES/mL ENTEROCOCCUS FAECALIS 10,000 COLONIES/mL ACINETOBACTER CALCOACETICUS/BAUMANNII COMPLEX    Report Status 09/15/2020 FINAL  Final   Organism ID, Bacteria ENTEROCOCCUS FAECALIS (A)  Final   Organism ID, Bacteria ACINETOBACTER CALCOACETICUS/BAUMANNII COMPLEX (A)  Final      Susceptibility   Acinetobacter calcoaceticus/baumannii complex - MIC*    CEFTAZIDIME 4 SENSITIVE Sensitive     CIPROFLOXACIN <=0.25 SENSITIVE Sensitive     GENTAMICIN <=1 SENSITIVE Sensitive     IMIPENEM <=0.25 SENSITIVE Sensitive     PIP/TAZO 8 SENSITIVE Sensitive     TRIMETH/SULFA <=20 SENSITIVE Sensitive      AMPICILLIN/SULBACTAM <=2 SENSITIVE Sensitive     * 10,000 COLONIES/mL ACINETOBACTER CALCOACETICUS/BAUMANNII COMPLEX   Enterococcus faecalis - MIC*    AMPICILLIN <=2 SENSITIVE Sensitive     NITROFURANTOIN <=16 SENSITIVE Sensitive     VANCOMYCIN 1 SENSITIVE Sensitive     * >=100,000 COLONIES/mL ENTEROCOCCUS FAECALIS      Radiology Studies: No results found.      LOS: 11 days   Jason Hartman  [facility] Pager on [email or web address]  09/21/2020, 12:51 PM

## 2020-09-21 NOTE — Progress Notes (Signed)
[place] for [facility] Indication: bridge therapy for Aflutter while apixaban on hold   Patient Measurements: Height: 5\' 4"  (162.6 cm) Weight: 83.6 kg (184 lb 4.9 oz) IBW/kg (Calculated) : 59.2 Heparin Dosing Weight:   Vital Signs: Temp: 98.3 F (36.8 C) (06/16 0515) BP: 132/62 (06/16 0515) Pulse Rate: 73 (06/16 0515)  Labs: Recent Labs    09/19/20 0503 09/20/20 0926  HGB 15.0  --   HCT 49.1  --   PLT 154  --   CREATININE 1.00 1.07     Estimated Creatinine Clearance: 51.9 mL/min (by C-G formula based on SCr of 1.07 mg/dL).    Medications:   amiodarone  200 mg Oral Daily   enoxaparin (LOVENOX) injection  1 mg/kg Subcutaneous Q12H   hydrocortisone cream   Topical BID   insulin aspart  0-9 Units Subcutaneous Q4H   insulin glargine  5 Units Subcutaneous Daily   lidocaine  1 patch Transdermal Q24H   mouth rinse  15 mL Mouth Rinse BID   metoprolol tartrate  2.5 mg Intravenous Q6H   Rotigotine  1 patch Transdermal QPM   thiamine  100 mg Oral Daily    Assessment: 85 yo M admitted on 6/3 started on apixaban 5 mg po bid on 09/13/2020 for Aflutter.  Pharmacy consulted to dose LMWH for bridge therapy while pt NPO.   Per speech and pathology, recommend to take whole meds with puree.  Today, 09/21/2020: SCr improved to 1.07, CrCl ~ 50 ml/min.   CBC WNL.   No bleeding reported.   Goal of Therapy:  Anti-Xa level 0.6-1 units/ml 4hrs after LMWH dose given Monitor platelets by anticoagulation protocol: Yes   Plan:  Continue Lovenox 1 mg/kg (80mg ) SQ q12h Monitor for s/s bleeding May consider switching back to eliquis soon, will leave up to provider.   Venora Maples, PharmD Candidate

## 2020-09-21 NOTE — Progress Notes (Signed)
Physical Therapy Treatment Patient Details Name: ARLEIGH ODOWD MRN: [id number] DOB: 06/10/1935 Today's Date: 09/21/2020    History of Present Illness 85 yo male admitted with anxiety, afib, ams, agitation requiring sedation. Head CT negative; MRI brain 6/14: No acute abnormality; MRI c-spine 6/14: Cervical spondylosis, no acute abnormality. hx of osa, parkinson's, ckd, dm    PT Comments    Pt more alert this session, looking to R side and engaging with mobility, though continues to remain confused with tearful moments. Pt requires +2 for supine to sit, initiates RLE abduction over to EOB, difficulty performing LLE adduction over to EOB, unsure if weakness or motor planning deficit. Max multimodal cues for hand placement and sequencing with STEDY transfer, pt requiring assistance to don L hand onto crossbar and maintain grip, able to improve posture to swing paddles down. Once in chair, pt tolerates quad sets and ankle pumps. Pt fatigued from transfer, educated to perform additional sets/reps later if feeling rested. Educated pt on time OOB, mobility, call button and wrote on board time OOB because pt appears to be confused regarding time. RN notified of session. Will continue acute PT as able with plan of SNF for strengthening prior to return home.   Follow Up Recommendations  SNF     Equipment Recommendations  None recommended by PT    Recommendations for Other Services       Precautions / Restrictions Precautions Precautions: Fall Restrictions Weight Bearing Restrictions: No    Mobility  Bed Mobility Overal bed mobility: Needs Assistance Bed Mobility: Supine to Sit  Supine to sit: Max assist;+2 for physical assistance;+2 for safety/equipment;HOB elevated  General bed mobility comments: Pt initiates RLE inching over to EOB, difficulty sequencing LLE to adduct it over to EOB requiring assistance, unable to reach across chest for bedrail with LUE to upright trunk, ultimately  requiring max A+2 for trunk and LEs mobility, significantly increasead time and multimodal cues    Transfers Overall transfer level: Needs assistance   Transfers: Sit to/from Stand Sit to Stand: Max assist;+2 physical assistance;+2 safety/equipment  General transfer comment: assistance to grab STEDY crossbar and maintain grip with L hand, VCs for glute/quad activation to power to stand, max A +2 with active LE engagement noted, pt prefers to maintain trunk flexed over STEDY, able to upright with VCs; transferred to recliner  Ambulation/Gait   Stairs             Wheelchair Mobility    Modified Rankin (Stroke Patients Only)       Balance Overall balance assessment: Needs assistance Sitting-balance support: Feet supported;Bilateral upper extremity supported Sitting balance-Leahy Scale: Zero Sitting balance - Comments: strong posterior lean with moderate R lateral lean, max A to maintain sitting EOB, progressed to min A with UEs bracing on bed with slow recline out of position requiring assistance to return, tolerates ~8 minutes prior to transfer Postural control: Posterior lean;Right lateral lean               Cognition Arousal/Alertness: Awake/alert Behavior During Therapy: WFL for tasks assessed/performed Overall Cognitive Status: No family/caregiver present to determine baseline cognitive functioning  General Comments: Pt follows commands appropriately with multimodal cues, continues to be confused at times asking therapist about her children multiple times, and asking when therapist will come in to help him as therapist is actively working with him. Pt crying intermittently during session reporting he used to do 100 push ups, then calm and asking therapist where the restroom is so he can  walk to it later when he needs it. Constant education and encouragement during session- RN aware.      Exercises General Exercises - Lower Extremity Ankle Circles/Pumps:  Seated;AROM;Both;10 reps Quad Sets: Seated;AROM;Strengthening;Both;10 reps (tactile cues initially progressing to none)    General Comments        Pertinent Vitals/Pain Pain Assessment: Faces Faces Pain Scale: Hurts a little bit Pain Location: L toes Pain Descriptors / Indicators: Sore Pain Intervention(s): Limited activity within patient's tolerance;Monitored during session    Home Living                      Prior Function            PT Goals (current goals can now be found in the care plan section) Acute Rehab PT Goals Patient Stated Goal: "get out of bed" PT Goal Formulation: With patient Time For Goal Achievement: 09/26/20 Potential to Achieve Goals: Fair Progress towards PT goals: Progressing toward goals    Frequency    Min 2X/week      PT Plan Current plan remains appropriate    Co-evaluation              AM-PAC PT "6 Clicks" Mobility   Outcome Measure  Help needed turning from your back to your side while in a flat bed without using bedrails?: Total Help needed moving from lying on your back to sitting on the side of a flat bed without using bedrails?: Total Help needed moving to and from a bed to a chair (including a wheelchair)?: Total Help needed standing up from a chair using your arms (e.g., wheelchair or bedside chair)?: Total Help needed to walk in hospital room?: Total Help needed climbing 3-5 steps with a railing? : Total 6 Click Score: 6    End of Session Equipment Utilized During Treatment: Gait belt Activity Tolerance: Patient tolerated treatment well;Patient limited by fatigue Patient left: in chair;with call bell/phone within reach;with chair alarm set Nurse Communication: Mobility status;Need for lift equipment PT Visit Diagnosis: Difficulty in walking, not elsewhere classified (R26.2)     Time: 1100-1135 PT Time Calculation (min) (ACUTE ONLY): 35 min  Charges:  $Therapeutic Activity: 23-37 mins                       Tori Fines Kimberlin PT, DPT 09/21/20, 12:17 PM

## 2020-09-22 DIAGNOSIS — A419 Sepsis, unspecified organism: Principal | ICD-10-CM

## 2020-09-22 DIAGNOSIS — R6520 Severe sepsis without septic shock: Secondary | ICD-10-CM

## 2020-09-22 DIAGNOSIS — E119 Type 2 diabetes mellitus without complications: Secondary | ICD-10-CM

## 2020-09-22 DIAGNOSIS — R7881 Bacteremia: Secondary | ICD-10-CM

## 2020-09-22 DIAGNOSIS — N320 Bladder-neck obstruction: Secondary | ICD-10-CM

## 2020-09-22 DIAGNOSIS — D751 Secondary polycythemia: Secondary | ICD-10-CM

## 2020-09-22 DIAGNOSIS — I119 Hypertensive heart disease without heart failure: Secondary | ICD-10-CM

## 2020-09-22 DIAGNOSIS — R338 Other retention of urine: Secondary | ICD-10-CM

## 2020-09-22 LAB — CBC
HCT: 45.4 % (ref 39.0–52.0)
Hemoglobin: 14.2 g/dL (ref 13.0–17.0)
MCH: 28.5 pg (ref 26.0–34.0)
MCHC: 31.3 g/dL (ref 30.0–36.0)
MCV: 91 fL (ref 80.0–100.0)
Platelets: 185 10*3/uL (ref 150–400)
RBC: 4.99 MIL/uL (ref 4.22–5.81)
RDW: 13.8 % (ref 11.5–15.5)
WBC: 8.4 10*3/uL (ref 4.0–10.5)
nRBC: 0 % (ref 0.0–0.2)

## 2020-09-22 LAB — APTT: aPTT: 35 seconds (ref 24–36)

## 2020-09-22 LAB — GLUCOSE, CAPILLARY
Glucose-Capillary: 121 mg/dL — ABNORMAL HIGH (ref 70–99)
Glucose-Capillary: 137 mg/dL — ABNORMAL HIGH (ref 70–99)
Glucose-Capillary: 157 mg/dL — ABNORMAL HIGH (ref 70–99)
Glucose-Capillary: 235 mg/dL — ABNORMAL HIGH (ref 70–99)
Glucose-Capillary: 152 mg/dL — ABNORMAL HIGH (ref 70–99)

## 2020-09-22 LAB — PROTIME-INR
INR: 1.2 (ref 0.8–1.2)
Prothrombin Time: 14.7 seconds (ref 11.4–15.2)

## 2020-09-22 MED ORDER — FERROUS SULFATE 325 (65 FE) MG PO TABS
325.0000 mg | ORAL_TABLET | Freq: Every day | ORAL | Status: DC
  Administered 2020-09-22 – 2020-09-25 (×4): 325 mg via ORAL
  Filled 2020-09-22 (×4): qty 1

## 2020-09-22 MED ORDER — METOPROLOL TARTRATE 25 MG PO TABS
12.5000 mg | ORAL_TABLET | Freq: Two times a day (BID) | ORAL | Status: DC
  Administered 2020-09-22 – 2020-09-25 (×7): 12.5 mg via ORAL
  Filled 2020-09-22 (×7): qty 1

## 2020-09-22 MED ORDER — DULOXETINE HCL 20 MG PO CPEP
20.0000 mg | ORAL_CAPSULE | Freq: Every day | ORAL | Status: DC
  Administered 2020-09-22 – 2020-09-23 (×2): 20 mg via ORAL
  Filled 2020-09-22 (×2): qty 1

## 2020-09-22 MED ORDER — TADALAFIL 5 MG PO TABS
5.0000 mg | ORAL_TABLET | Freq: Every day | ORAL | Status: DC
  Administered 2020-09-22 – 2020-09-23 (×2): 5 mg via ORAL
  Filled 2020-09-22 (×2): qty 1

## 2020-09-22 MED ORDER — GERHARDT'S BUTT CREAM
TOPICAL_CREAM | Freq: Two times a day (BID) | CUTANEOUS | Status: DC
  Administered 2020-09-22 – 2020-09-25 (×3): 1 via TOPICAL
  Filled 2020-09-22: qty 1

## 2020-09-22 NOTE — Progress Notes (Addendum)
  Speech Language Pathology Treatment: Dysphagia  Patient Details Name: Jason Hartman MRN: [id number] DOB: 1936/03/21 Today's Date: 09/22/2020 Time: 9150-5697 SLP Time Calculation (min) (ACUTE ONLY): 11 min  Assessment / Plan / Recommendation Clinical Impression  Pt continues with improved LOA- and recalled need to check left side of cheeks/mouth to assure adequate clearance without cues- demonstrating excellent progress. He denies oral pocketing at baseline.   Pt consumed graham crackers, diet coke and water independently.  No indication of aspiration with po intake and adequate mastication observed without oral pocketing. Pt was leaning to the right slightly - which may have prevented left oral pocketing.  Minimal dyspnea noted with intake = thus recommend continue dys3 diet for energy conservation.   Suspect pt's swallow ability is at baseline - Pt would benefit from follow up for RMST to strengthen cough/voice for swallowing/airway protection.  Will follow up next week if here to initiate for carryover at SNF.    HPI HPI: 85 y.o. male with medical history significant for OSA, Parkinson's, restless leg syndrome, anxiety, chronic pain, chronic kidney disease, and insulin-dependent diabetes mellitus, who presented to the emergency department for evaluation of anxiety and diaphoresis.  Patient has had a lot of medication changes recently including discontinuation of alprazolam and initiation of tramadol Celexa BuSpar.  During the course of this hospital stay has become very encephalopathic, etiology unclear.  Pt imaging showed chronic bilateral basal ganglia cvas and mild chronic changes in pons. Pt underwent swallow evaluation and was started on full liquids,Today seeing pt to determine readiness for advancement.      SLP Plan  All goals met       Recommendations  Diet recommendations: Dysphagia 3 (mechanical soft);Thin liquid Liquids provided via: Cup;Straw Medication Administration: Whole  meds with puree Supervision: Patient able to self feed Compensations: Minimize environmental distractions;Slow rate;Lingual sweep for clearance of pocketing Postural Changes and/or Swallow Maneuvers: Seated upright 90 degrees;Upright 30-60 min after meal                Oral Care Recommendations: Oral care BID;Staff/trained caregiver to provide oral care Follow up Recommendations: None SLP Visit Diagnosis: Dysphagia, oropharyngeal phase (R13.12) Plan: All goals met       GO              Kathleen Lime, MS [facility] SLP Acute Rehab Services Office [phone] Pager [phone]   Jason Hartman 09/22/2020, 1:09 PM

## 2020-09-22 NOTE — Progress Notes (Signed)
PROGRESS NOTE  Jason Hartman VVO:[id number] DOB: [birth year]   PCP: [name], MD  Patient is from: Home.  Lives with his wife.  DOA: 09/08/2020 LOS: 12  Chief complaints:  Chief Complaint  Patient presents with   Anxiety   Nausea     Brief Narrative / Interim history: 85 year old M with PMH of OSA, Parkinson disease, IDDM-2, CKD, RLS, chronic pain, anxiety and recent circumcision presenting with "anxiety" and diaphoresis, and admitted for acute encephalopathy in the setting of complicated UTI with acute urinary retention due to BOO.  Urine culture with Enterococcus and Acinetobacter.  Extensive encephalopathy work-up unrevealing except for UTI and possible polypharmacy.    Hospital course noteworthy of brief a flutter for which he was started on amiodarone, metoprolol and subcu Lovenox.  Patient's mental status improved.  Patient has been on IV Unasyn.  Therapy recommended SNF.  Subjective: Seen and examined earlier this morning.  No major events overnight of this morning.  Concern about bleeding from circumcision site.  Clear looking urine urine from Foley catheter.  Patient has no complaints.  Asking when he could go home.  He is awake but not quite alert.  He is oriented to self, month, year and family but not place and date.  He responds no to pain and shortness of breath.  Objective: Vitals:   09/21/20 2013 09/21/20 2348 09/22/20 0550 09/22/20 1330  BP: (!) 150/66 139/70 137/66 (!) 149/76  Pulse: 69 72 75 79  Resp: (!) 24 (!) 22 (!) 22 19  Temp: 97.9 F (36.6 C) 97.9 F (36.6 C) 98 F (36.7 C) 98 F (36.7 C)  TempSrc: Oral Oral Oral Oral  SpO2: 98% 99% 100% 99%  Weight:   80.9 kg   Height:        Intake/Output Summary (Last 24 hours) at 09/22/2020 1405 Last data filed at 09/22/2020 1024 Gross per 24 hour  Intake 1402.83 ml  Output 3075 ml  Net -1672.17 ml   Filed Weights   09/19/20 0500 09/20/20 0600 09/22/20 0550  Weight: 85.9 kg 83.6 kg 80.9 kg     Examination:  GENERAL: No apparent distress.  Nontoxic. HEENT: MMM.  Vision and hearing grossly intact.  NECK: Supple.  No apparent JVD.  RESP: 100% on RA.  No IWOB.  Fair aeration bilaterally. CVS:  RRR. Heart sounds normal.  ABD/GI/GU: BS+. Abd soft, NTND.  Indwelling Foley. Some fresh blood from circumcision but no profuse bleeding. MSK/EXT:  Moves extremities. No apparent deformity. No edema.  SKIN: no apparent skin lesion or wound NEURO: Awake, alert and oriented to self, month, year and family.  No apparent focal neuro deficit. PSYCH: Calm. Normal affect.   Procedures:  None  Microbiology summarized: [id number] and influenza PCR nonreactive. Blood culture with staph epidermis-MecA/C positive in 1 out of 2 bottles Urine culture with pansensitive Enterococcus faecalis and Acinetobacter calcoaceticus/baumannii complex  Repeat blood cultures negative.  Assessment & Plan: Severe sepsis due to complicated UTI-POA.  Patient with bladder obstruction and urethral stricture followed by urology-had leukocytosis, AKI, encephalopathy and elevated troponin on presentation.  Urine culture as above.  Sepsis physiology resolved. -On IV Unasyn to complete treatment course   Urinary retention due to bladder outlet obstruction and urethral stricture-had recent circumcision.  Now with slow bleeding from circumcision area in the setting of Lovenox for atrial flutter. -Hold Lovenox -Check CBC and coag labs -Continue indwelling Foley catheter on discharge -Outpatient follow-up with his urologist.   Acute metabolic encephalopathy: Likely due to  the above, dehydration and polypharmacy.  He could have some degree of underlying cognitive impairment as well.  Extensive unrevealing encephalopathy work-up including CT head, EEG, TSH, cortisol, B12, ammonia, and MRI brain.  Seems to be on multiple sedating medications including maximum dose of gabapentin.  Improving.  -Reorientation delirium  precautions -Avoid or minimize sedating medications -Treat treatable causes. -Changed meds to p.o.  Atrial flutter RVR.  Converted to sinus rhythm. Started on amiodarone, beta-blocker and Eliquis by cardiology.  TTE basically normal. -Continue amiodarone and metoprolol -Hold Lovenox in the setting of bleeding from circumcision site.  Controlled IDDM-2: A1c 6.5%. Recent Labs  Lab 09/21/20 2008 09/21/20 2355 09/22/20 0353 09/22/20 0739 09/22/20 1158  GLUCAP 164* 155* 121* 137* 157*  -Continue current insulin regimen  Anxiety/RLS: On extremely high-dose gabapentin at home. -Gabapentin has been on hold in the setting of encephalopathy -May consider resuming lower dose as needed   Abnormal blood culture: Staph hominis in 1 out of 2 bottles.  Felt to be contaminant.   Obstructive sleep apnea -CPAP nightly.   Elevated D-dimer: VQ scan low probability.  LE [place] negative for DVT.   History of parkinsonism's -Continue home rotigotine patch.   Elevated troponin: Could be demand ischemia in the setting of atrial flutter and sepsis.  TTE reassuring.  Transient hypoxia: Resolved.  Essential hypertension: Normotensive. -Cardiac meds as above   AKI/azotemia: Resolved.  Rhabdomyolysis: Resolved. Secondary polycythemia: Likely from dehydration.  Resolved.  Class I obesity Body mass index is 30.61 kg/m.         DVT prophylaxis:    SCD  Code Status: Full code Family Communication: Patient and/or RN. Available if any question.  Level of care: Progressive Status is: Inpatient  Remains inpatient appropriate because:Altered mental status, Unsafe d/c plan, IV treatments appropriate due to intensity of illness or inability to take PO, and Inpatient level of care appropriate due to severity of illness  Dispo:  Patient From: Home  Planned Disposition: [place]  Medically stable for discharge: No         Consultants:  Cardiology   Sch Meds:  Scheduled  Meds:  amiodarone  200 mg Oral Daily   DULoxetine  20 mg Oral Daily   ferrous sulfate  325 mg Oral Daily   hydrocortisone cream   Topical BID   insulin aspart  0-9 Units Subcutaneous Q4H   insulin glargine  5 Units Subcutaneous Daily   lidocaine  1 patch Transdermal Q24H   mouth rinse  15 mL Mouth Rinse BID   metoprolol tartrate  12.5 mg Oral BID   Rotigotine  1 patch Transdermal QPM   tadalafil  5 mg Oral Daily   thiamine  100 mg Oral Daily   Continuous Infusions:  sodium chloride 50 mL/hr at 09/21/20 1809   ampicillin-sulbactam (UNASYN) IV 3 g (09/22/20 0820)   PRN Meds:.acetaminophen **OR** acetaminophen, bisacodyl, haloperidol lactate, hydrALAZINE, liver oil-zinc oxide, naLOXone (NARCAN)  injection, ofloxacin, ondansetron **OR** ondansetron (ZOFRAN) IV, senna-docusate, technetium albumin aggregated  Antimicrobials: Anti-infectives (From admission, onward)    Start     Dose/Rate Route Frequency Ordered Stop   09/21/20 1600  Ampicillin-Sulbactam (UNASYN) 3 g in sodium chloride 0.9 % 100 mL IVPB        3 g 200 mL/hr over 30 Minutes Intravenous Every 6 hours 09/21/20 1334 09/25/20 0359   09/18/20 1000  Ampicillin-Sulbactam (UNASYN) 3 g in sodium chloride 0.9 % 100 mL IVPB  Status:  Discontinued  3 g 200 mL/hr over 30 Minutes Intravenous Every 6 hours 09/18/20 0809 09/21/20 1334   09/15/20 1000  Ampicillin-Sulbactam (UNASYN) 3 g in sodium chloride 0.9 % 100 mL IVPB  Status:  Discontinued        3 g 200 mL/hr over 30 Minutes Intravenous Every 8 hours 09/15/20 0821 09/18/20 0809   09/13/20 0000  cefTRIAXone (ROCEPHIN) 1 g in sodium chloride 0.9 % 100 mL IVPB  Status:  Discontinued        1 g 200 mL/hr over 30 Minutes Intravenous Every 24 hours 09/12/20 2339 09/15/20 0809   09/09/20 0115  aztreonam (AZACTAM) 1 g in sodium chloride 0.9 % 100 mL IVPB        1 g 200 mL/hr over 30 Minutes Intravenous  Once 09/09/20 0106 09/09/20 0307        I have personally reviewed the  following labs and images: CBC: Recent Labs  Lab 09/16/20 0239 09/18/20 0510 09/19/20 0503  WBC 15.6* 12.0* 8.5  HGB 17.6* 16.4 15.0  HCT 56.4* 55.0* 49.1  MCV 91.7 95.8 94.1  PLT 191 162 154   BMP &GFR Recent Labs  Lab 09/16/20 0239 09/17/20 0321 09/18/20 0510 09/19/20 0503 09/20/20 0926  NA 141 146* 146* 139 141  K 4.2 4.5 4.1 3.9 3.8  CL 107 108 115* 106 107  CO2 23 24 25 26 29   GLUCOSE 213* 209* 159* 117* 159*  BUN 42* 70* 71* 47* 25*  CREATININE 1.34* 1.91* 1.36* 1.00 1.07  CALCIUM 9.1 9.0 8.6* 8.2* 8.5*   Estimated Creatinine Clearance: 51.1 mL/min (by C-G formula based on SCr of 1.07 mg/dL). Liver & Pancreas: No results for input(s): AST, ALT, ALKPHOS, BILITOT, PROT, ALBUMIN in the last 168 hours. No results for input(s): LIPASE, AMYLASE in the last 168 hours. No results for input(s): AMMONIA in the last 168 hours. Diabetic: No results for input(s): HGBA1C in the last 72 hours. Recent Labs  Lab 09/21/20 2008 09/21/20 2355 09/22/20 0353 09/22/20 0739 09/22/20 1158  GLUCAP 164* 155* 121* 137* 157*   Cardiac Enzymes: No results for input(s): CKTOTAL, CKMB, CKMBINDEX, TROPONINI in the last 168 hours. No results for input(s): PROBNP in the last 8760 hours. Coagulation Profile: No results for input(s): INR, PROTIME in the last 168 hours. Thyroid Function Tests: No results for input(s): TSH, T4TOTAL, FREET4, T3FREE, THYROIDAB in the last 72 hours. Lipid Profile: No results for input(s): CHOL, HDL, LDLCALC, TRIG, CHOLHDL, LDLDIRECT in the last 72 hours. Anemia Panel: No results for input(s): VITAMINB12, FOLATE, FERRITIN, TIBC, IRON, RETICCTPCT in the last 72 hours. Urine analysis:    Component Value Date/Time   COLORURINE YELLOW 09/12/2020 2253   APPEARANCEUR HAZY (A) 09/12/2020 2253   LABSPEC 1.005 09/12/2020 2253   PHURINE 6.0 09/12/2020 2253   GLUCOSEU >=500 (A) 09/12/2020 2253   HGBUR LARGE (A) 09/12/2020 2253   BILIRUBINUR NEGATIVE 09/12/2020 2253    KETONESUR NEGATIVE 09/12/2020 2253   PROTEINUR NEGATIVE 09/12/2020 2253   UROBILINOGEN 0.2 12/04/2013 0210   NITRITE NEGATIVE 09/12/2020 2253   LEUKOCYTESUR LARGE (A) 09/12/2020 2253   Sepsis Labs: Invalid input(s): PROCALCITONIN, [place]  Microbiology: Recent Results (from the past 240 hour(s))  Culture, Urine     Status: Abnormal   Collection Time: 09/12/20 11:39 PM   Specimen: Urine, Clean Catch  Result Value Ref Range Status   Specimen Description   Final    URINE, CLEAN CATCH Performed at [facility], [place] [address], [place], [place] [address]  Special Requests   Final    NONE Performed at [facility], [place] [address] [place], [place] [address]    Culture (A)  Final    >=100,000 COLONIES/mL ENTEROCOCCUS FAECALIS 10,000 COLONIES/mL ACINETOBACTER CALCOACETICUS/BAUMANNII COMPLEX    Report Status 09/15/2020 FINAL  Final   Organism ID, Bacteria ENTEROCOCCUS FAECALIS (A)  Final   Organism ID, Bacteria ACINETOBACTER CALCOACETICUS/BAUMANNII COMPLEX (A)  Final      Susceptibility   Acinetobacter calcoaceticus/baumannii complex - MIC*    CEFTAZIDIME 4 SENSITIVE Sensitive     CIPROFLOXACIN <=0.25 SENSITIVE Sensitive     GENTAMICIN <=1 SENSITIVE Sensitive     IMIPENEM <=0.25 SENSITIVE Sensitive     PIP/TAZO 8 SENSITIVE Sensitive     TRIMETH/SULFA <=20 SENSITIVE Sensitive     AMPICILLIN/SULBACTAM <=2 SENSITIVE Sensitive     * 10,000 COLONIES/mL ACINETOBACTER CALCOACETICUS/BAUMANNII COMPLEX   Enterococcus faecalis - MIC*    AMPICILLIN <=2 SENSITIVE Sensitive     NITROFURANTOIN <=16 SENSITIVE Sensitive     VANCOMYCIN 1 SENSITIVE Sensitive     * >=100,000 COLONIES/mL ENTEROCOCCUS FAECALIS    Radiology Studies: No results found.     Shravya Wickwire T. [place]  If 7PM-7AM, please contact night-coverage [email or web address] 09/22/2020, 2:05 PM

## 2020-09-22 NOTE — Progress Notes (Addendum)
Patient continues to bleed slowly from penis.  Urine is clear.  Large clots are present and site is painful.  Dr. Cyndia Skeeters on floor for rounds this AM, aware of bleeding.  Lovenox d/c and labs ordered. Will continue to monitor.

## 2020-09-22 NOTE — Care Management Important Message (Signed)
Important Message  Patient Details IM Letter given to the Patient. Name: Jason Hartman MRN: [id number] Date of Birth: [birth year]   Medicare Important Message Given:  Yes     Kerin Salen 09/22/2020, 10:02 AM

## 2020-09-22 NOTE — TOC Progression Note (Addendum)
Transition of Care [facility]) - Progression Note    Patient Details  Name: LISANDRO MEGGETT MRN: [id number] Date of Birth: 02-Feb-1936  Transition of Care [facility]) CM/SW Contact  Ourania Hamler, Juliann Pulse, RN Phone Number: 09/22/2020, 12:57 PM  Clinical Narrative:  Received message from [facility] can accept today-TC spouse-Kathryn-she will discuss LTACH w/son-explained I would need to know before 5p today-she says she will get back to me-also provided her w/SNF bed offers-[facility]-await choice or if LTACH is chosen.     Expected Discharge Plan: [place] Barriers to Discharge: Continued Medical Work up  Expected Discharge Plan and Services Expected Discharge Plan: [place]   Discharge Planning Services: CM Consult   Living arrangements for the past 2 months: Single Family Home                                       Social Determinants of Health (SDOH) Interventions    Readmission Risk Interventions No flowsheet data found.

## 2020-09-23 LAB — GLUCOSE, CAPILLARY
Glucose-Capillary: 112 mg/dL — ABNORMAL HIGH (ref 70–99)
Glucose-Capillary: 119 mg/dL — ABNORMAL HIGH (ref 70–99)
Glucose-Capillary: 126 mg/dL — ABNORMAL HIGH (ref 70–99)
Glucose-Capillary: 174 mg/dL — ABNORMAL HIGH (ref 70–99)
Glucose-Capillary: 178 mg/dL — ABNORMAL HIGH (ref 70–99)
Glucose-Capillary: 206 mg/dL — ABNORMAL HIGH (ref 70–99)
Glucose-Capillary: 226 mg/dL — ABNORMAL HIGH (ref 70–99)

## 2020-09-23 MED ORDER — COVID-19 MRNA VACC (MODERNA) 50 MCG/0.25ML IM SUSP
0.2500 mL | Freq: Once | INTRAMUSCULAR | Status: AC
  Administered 2020-09-23: 0.25 mL via INTRAMUSCULAR
  Filled 2020-09-23: qty 0.25

## 2020-09-23 MED ORDER — INSULIN ASPART 100 UNIT/ML IJ SOLN
0.0000 [IU] | Freq: Three times a day (TID) | INTRAMUSCULAR | Status: DC
  Administered 2020-09-24 (×2): 2 [IU] via SUBCUTANEOUS
  Administered 2020-09-25: 3 [IU] via SUBCUTANEOUS
  Administered 2020-09-25: 2 [IU] via SUBCUTANEOUS

## 2020-09-23 NOTE — Progress Notes (Signed)
PROGRESS NOTE  Jason Hartman SHF:[id number] DOB: June 20, 1935   PCP: [name], MD  Patient is from: Home.  Lives with his wife.  DOA: 09/08/2020 LOS: 70  Chief complaints:  Chief Complaint  Patient presents with   Anxiety   Nausea     Brief Narrative / Interim history: 85 year old M with PMH of OSA, Parkinson disease, IDDM-2, CKD, RLS, chronic pain, anxiety and recent circumcision presenting with "anxiety" and diaphoresis, and admitted for acute encephalopathy in the setting of complicated UTI with acute urinary retention due to BOO.  Urine culture with Enterococcus and Acinetobacter.  Extensive encephalopathy work-up unrevealing except for UTI and possible polypharmacy.    Hospital course noteworthy of brief a flutter for which he was started on amiodarone, metoprolol and subcu Lovenox.  Patient's mental status improved.  Patient has been on IV Unasyn.  Therapy recommended SNF.  Subjective: Seen and examined earlier this morning.  No major events overnight of this morning.  No complaints but he likes to get out of the hospital.  He denies pain, shortness of breath, nausea or vomiting.  Not a great historian.  He is awake and alert but only oriented to self and "hospital".  He thinks he is in [place].   Objective: Vitals:   09/22/20 2203 09/23/20 0633 09/23/20 0956 09/23/20 1211  BP: 137/62 (!) 151/62 (!) 116/93 (!) 143/69  Pulse: 67  69 63  Resp: 20   16  Temp: 97.8 F (36.6 C) 97.6 F (36.4 C)  98.7 F (37.1 C)  TempSrc:  Oral  Oral  SpO2: 97% 99%  99%  Weight:  81.8 kg    Height:        Intake/Output Summary (Last 24 hours) at 09/23/2020 1309 Last data filed at 09/23/2020 0921 Gross per 24 hour  Intake 1895.16 ml  Output 1400 ml  Net 495.16 ml   Filed Weights   09/20/20 0600 09/22/20 0550 09/23/20 0633  Weight: 83.6 kg 80.9 kg 81.8 kg    Examination:  GENERAL: No apparent distress.  Nontoxic. HEENT: MMM.  Vision and hearing grossly intact.   NECK: Supple.  No apparent JVD.  RESP: On RA.  No IWOB.  Fair aeration bilaterally. CVS:  RRR. Heart sounds normal.  ABD/GI/GU: BS+. Abd soft, NTND.  Indwelling Foley. Some old blood in urethral meatus.  Clean urine in Foley bag MSK/EXT:  Moves extremities. No apparent deformity. No edema.  SKIN: no apparent skin lesion or wound NEURO: Awake and alert. Oriented to self and "hospital".  No apparent focal neuro deficit. PSYCH: Calm. Normal affect.   Procedures:  None  Microbiology summarized: [id number] and influenza PCR nonreactive. Blood culture with staph epidermis-MecA/C positive in 1 out of 2 bottles Urine culture with pansensitive Enterococcus faecalis and Acinetobacter calcoaceticus/baumannii complex  Repeat blood cultures negative.  Assessment & Plan: Severe sepsis due to complicated UTI-POA.  Patient with bladder obstruction and urethral stricture followed by urology-had leukocytosis, AKI, encephalopathy and elevated troponin on presentation.  Urine culture as above.  Sepsis physiology resolved. -On IV Unasyn to complete treatment course through 6/20   Urinary retention due to bladder outlet obstruction and urethral stricture- Urology placed Foley on 6/10 using multiple dilators a catheter. Some bleeding from circumcision area in the setting of Lovenox for atrial flutter. -Continue holding Lovenox. -Given difficulty with Foley placement, will defer voiding trial to his urologist outpatient -Continue tadalafil   Acute metabolic encephalopathy: Likely due to the above, dehydration and polypharmacy.  He could have  some degree of underlying cognitive impairment as well.  Extensive unrevealing encephalopathy work-up including CT head, EEG, TSH, cortisol, B12, ammonia, and MRI brain.  Seems to be on multiple sedating medications including maximum dose of gabapentin.  Improving.  -Reorientation delirium precautions -Avoid or minimize sedating medications -Treat treatable  causes. -Changed meds to p.o.  Atrial flutter RVR.  Converted to sinus rhythm. Started on amiodarone, beta-blocker and Eliquis by cardiology.  TTE basically normal. -Continue amiodarone and metoprolol -Hold Lovenox in the setting of bleeding from circumcision site.  Controlled IDDM-2: A1c 6.5%. Recent Labs  Lab 09/22/20 2000 09/23/20 0022 09/23/20 0407 09/23/20 0753 09/23/20 1139  GLUCAP 152* 178* 112* 126* 174*  -Continue current insulin regimen  Anxiety/RLS: On extremely high-dose gabapentin at home. -Gabapentin has been on hold for a week now.  No withdrawal symptoms. -May consider resuming lower dose as needed   Abnormal blood culture: Staph hominis in 1 out of 2 bottles.  Felt to be contaminant.   Obstructive sleep apnea -CPAP nightly.   Elevated D-dimer: VQ scan low probability.  LE [place] negative for DVT.   History of parkinsonism's -Continue home rotigotine patch.   Elevated troponin: Could be demand ischemia in the setting of atrial flutter and sepsis.  TTE reassuring.  Transient hypoxia: Resolved.  Essential hypertension: Normotensive. -Cardiac meds as above   AKI/azotemia: Resolved.  Rhabdomyolysis: Resolved. Secondary polycythemia: Likely from dehydration.  Resolved.  At risk for polypharmacy: Seems to take tramadol, Norco, very high-dose gabapentin at home. -Adjusted medications  Class I obesity Body mass index is 30.97 kg/m.         DVT prophylaxis:    SCD  Code Status: Full code Family Communication: Patient and/or RN. Available if any question.  Level of care: Progressive Status is: Inpatient  Remains inpatient appropriate because:Unsafe d/c plan  Dispo:  Patient From: Home  Planned Disposition: [place]  Medically stable for discharge: Yes         Consultants:  Cardiology Neurology Urology Pulmonology   Sch Meds:  Scheduled Meds:  amiodarone  200 mg Oral Daily   DULoxetine  20 mg Oral Daily   ferrous  sulfate  325 mg Oral Daily   Gerhardt's butt cream   Topical BID   hydrocortisone cream   Topical BID   insulin aspart  0-9 Units Subcutaneous Q4H   insulin glargine  5 Units Subcutaneous Daily   lidocaine  1 patch Transdermal Q24H   mouth rinse  15 mL Mouth Rinse BID   metoprolol tartrate  12.5 mg Oral BID   Rotigotine  1 patch Transdermal QPM   tadalafil  5 mg Oral Daily   thiamine  100 mg Oral Daily   Continuous Infusions:  sodium chloride 50 mL/hr at 09/23/20 0954   ampicillin-sulbactam (UNASYN) IV 3 g (09/23/20 1004)   PRN Meds:.acetaminophen **OR** acetaminophen, bisacodyl, haloperidol lactate, hydrALAZINE, liver oil-zinc oxide, naLOXone (NARCAN)  injection, ofloxacin, ondansetron **OR** ondansetron (ZOFRAN) IV, senna-docusate, technetium albumin aggregated  Antimicrobials: Anti-infectives (From admission, onward)    Start     Dose/Rate Route Frequency Ordered Stop   09/21/20 1600  Ampicillin-Sulbactam (UNASYN) 3 g in sodium chloride 0.9 % 100 mL IVPB        3 g 200 mL/hr over 30 Minutes Intravenous Every 6 hours 09/21/20 1334 09/25/20 0359   09/18/20 1000  Ampicillin-Sulbactam (UNASYN) 3 g in sodium chloride 0.9 % 100 mL IVPB  Status:  Discontinued        3 g 200  mL/hr over 30 Minutes Intravenous Every 6 hours 09/18/20 0809 09/21/20 1334   09/15/20 1000  Ampicillin-Sulbactam (UNASYN) 3 g in sodium chloride 0.9 % 100 mL IVPB  Status:  Discontinued        3 g 200 mL/hr over 30 Minutes Intravenous Every 8 hours 09/15/20 0821 09/18/20 0809   09/13/20 0000  cefTRIAXone (ROCEPHIN) 1 g in sodium chloride 0.9 % 100 mL IVPB  Status:  Discontinued        1 g 200 mL/hr over 30 Minutes Intravenous Every 24 hours 09/12/20 2339 09/15/20 0809   09/09/20 0115  aztreonam (AZACTAM) 1 g in sodium chloride 0.9 % 100 mL IVPB        1 g 200 mL/hr over 30 Minutes Intravenous  Once 09/09/20 0106 09/09/20 0307        I have personally reviewed the following labs and images: CBC: Recent Labs   Lab 09/18/20 0510 09/19/20 0503 09/22/20 1417  WBC 12.0* 8.5 8.4  HGB 16.4 15.0 14.2  HCT 55.0* 49.1 45.4  MCV 95.8 94.1 91.0  PLT 162 154 185   BMP &GFR Recent Labs  Lab 09/17/20 0321 09/18/20 0510 09/19/20 0503 09/20/20 0926  NA 146* 146* 139 141  K 4.5 4.1 3.9 3.8  CL 108 115* 106 107  CO2 24 25 26 29   GLUCOSE 209* 159* 117* 159*  BUN 70* 71* 47* 25*  CREATININE 1.91* 1.36* 1.00 1.07  CALCIUM 9.0 8.6* 8.2* 8.5*   Estimated Creatinine Clearance: 51.3 mL/min (by C-G formula based on SCr of 1.07 mg/dL). Liver & Pancreas: No results for input(s): AST, ALT, ALKPHOS, BILITOT, PROT, ALBUMIN in the last 168 hours. No results for input(s): LIPASE, AMYLASE in the last 168 hours. No results for input(s): AMMONIA in the last 168 hours. Diabetic: No results for input(s): HGBA1C in the last 72 hours. Recent Labs  Lab 09/22/20 2000 09/23/20 0022 09/23/20 0407 09/23/20 0753 09/23/20 1139  GLUCAP 152* 178* 112* 126* 174*   Cardiac Enzymes: No results for input(s): CKTOTAL, CKMB, CKMBINDEX, TROPONINI in the last 168 hours. No results for input(s): PROBNP in the last 8760 hours. Coagulation Profile: Recent Labs  Lab 09/22/20 1417  INR 1.2   Thyroid Function Tests: No results for input(s): TSH, T4TOTAL, FREET4, T3FREE, THYROIDAB in the last 72 hours. Lipid Profile: No results for input(s): CHOL, HDL, LDLCALC, TRIG, CHOLHDL, LDLDIRECT in the last 72 hours. Anemia Panel: No results for input(s): VITAMINB12, FOLATE, FERRITIN, TIBC, IRON, RETICCTPCT in the last 72 hours. Urine analysis:    Component Value Date/Time   COLORURINE YELLOW 09/12/2020 2253   APPEARANCEUR HAZY (A) 09/12/2020 2253   LABSPEC 1.005 09/12/2020 2253   PHURINE 6.0 09/12/2020 2253   GLUCOSEU >=500 (A) 09/12/2020 2253   HGBUR LARGE (A) 09/12/2020 2253   BILIRUBINUR NEGATIVE 09/12/2020 2253   KETONESUR NEGATIVE 09/12/2020 2253   PROTEINUR NEGATIVE 09/12/2020 2253   UROBILINOGEN 0.2 12/04/2013 0210    NITRITE NEGATIVE 09/12/2020 2253   LEUKOCYTESUR LARGE (A) 09/12/2020 2253   Sepsis Labs: Invalid input(s): PROCALCITONIN, [place]  Microbiology: No results found for this or any previous visit (from the past 240 hour(s)).   Radiology Studies: No results found.     Mitsuye Schrodt T. [place]  If 7PM-7AM, please contact night-coverage [email or web address] 09/23/2020, 1:09 PM

## 2020-09-23 NOTE — Progress Notes (Signed)
Physical Therapy Treatment Patient Details Name: Jason Hartman MRN: [id number] DOB: 10/12/1935 Today's Date: 09/23/2020    History of Present Illness 85 yo male admitted with anxiety, afib, ams, agitation requiring sedation. Head CT negative; MRI brain 6/14: No acute abnormality; MRI c-spine 6/14: Cervical spondylosis, no acute abnormality. hx of osa, parkinson's, ckd, dm    PT Comments    Pt having a difficult day today multiple BMs, not comfort in bed c/o leg pain, and having some moments of sweating "not feeling right".  This afternoon wife present and a little better and wanted to stand and work with PT.   Pt with improvement since last time with ability to stand at [facility], just difficulty with weight sifting and initiation of LEs for stepping or pivoted. However tolerates several sit to stands, side stepping and pivot to [facility].  Pt still at [facility] to MAx A for mobility.Will continue to follow while in acute care.    Follow Up Recommendations  SNF (unless family (since PTS want to provide assist for all mobilty 24/7))     Equipment Recommendations  None recommended by PT    Recommendations for Other Services       Precautions / Restrictions Precautions Precautions: Fall    Mobility  Bed Mobility Overal bed mobility: Needs Assistance Bed Mobility: Supine to Sit;Sit to Supine     Supine to sit: Mod assist Sit to supine: Max assist   General bed mobility comments: PT initiated with LEs for both activities, and use of UEs to help reach and pull, but assist needed for toatl movment of LES on/off bed and scooting of hip. However once sitting, and had to scoot to the head of the bed, pt was Min/ModA for scoot to the right towards head of bed    Transfers Overall transfer level: Needs assistance Equipment used: Rolling walker (2 wheeled) Transfers: Sit to/from [facility] Sit to Stand: Mod assist Stand pivot transfers: Mod assist       General transfer comment:  had to really boost up to standing, but once standing pt toleated own weight but assist for balance. Performed this 3x, then performed stnad pivot with RW to [facility] and it was challenging to wieght shift and progress LES. Pt had difficulty with this at home as well ( reported shuffled gait by pt's son)  Ambulation/Gait Ambulation/Gait assistance: Mod assist Gait Distance (Feet): 3 Feet Assistive device: Rolling walker (2 wheeled) Gait Pattern/deviations: Step-to pattern     General Gait Details: small steps side stepping to head of bed and for pivot. very small stance, shuffled gait , no clearance and not much separation of LEs for movement. Pt was also a little fearful.   Stairs             Wheelchair Mobility    Modified Rankin (Stroke Patients Only)       Balance Overall balance assessment: Needs assistance Sitting-balance support: Feet supported;Bilateral upper extremity supported Sitting balance-Leahy Scale: Fair     Standing balance support: Bilateral upper extremity supported;During functional activity Standing balance-Leahy Scale: Poor                              Cognition Arousal/Alertness: Awake/alert Behavior During Therapy: WFL for tasks assessed/performed  Exercises      General Comments        Pertinent Vitals/Pain Pain Assessment: Faces Faces Pain Scale: Hurts little more Pain Location: pt uncomfortable in bed wiggling legs alot stating they hurt. Pt's wife at bedside stating that he has " restless leg syndrome" . Pain Descriptors / Indicators: Aching Pain Intervention(s): Monitored during session    Home Living                      Prior Function            PT Goals (current goals can now be found in the care plan section) Acute Rehab PT Goals Patient Stated Goal: "get out of bed" PT Goal Formulation: With patient Time For Goal Achievement: 09/26/20 Potential  to Achieve Goals: Fair Progress towards PT goals: Progressing toward goals    Frequency    Min 3X/week      PT Plan Current plan remains appropriate    Co-evaluation              AM-PAC PT "6 Clicks" Mobility   Outcome Measure  Help needed turning from your back to your side while in a flat bed without using bedrails?: A Little Help needed moving from lying on your back to sitting on the side of a flat bed without using bedrails?: A Lot Help needed moving to and from a bed to a chair (including a wheelchair)?: A Lot Help needed standing up from a chair using your arms (e.g., wheelchair or bedside chair)?: A Lot Help needed to walk in hospital room?: Total Help needed climbing 3-5 steps with a railing? : Total 6 Click Score: 11    End of Session Equipment Utilized During Treatment: Gait belt Activity Tolerance: Patient tolerated treatment well;Patient limited by fatigue Patient left: in bed;with family/visitor present Nurse Communication: Mobility status;Need for lift equipment (nursing needs to use stedy) PT Visit Diagnosis: Difficulty in walking, not elsewhere classified (R26.2)     Time: 1600-1640 PT Time Calculation (min) (ACUTE ONLY): 40 min  Charges:  $Therapeutic Activity: 38-52 mins                     Kemya Shed, PT, MPT Acute Rehabilitation Services Office: [phone] Pager: [phone] 09/23/2020    Shalon Salado, Gatha Mayer 09/23/2020, 6:01 PM

## 2020-09-23 NOTE — Progress Notes (Signed)
PT in to get pt oob. Pt found diaphoretic. BS 206 up from 174. Vital signs stable and pt able to tell me who he is and that he was in the hospital. Only change was  pt was given haldol fr agitation 2 mg. Denies CP,denies pain with bladder palpitations. Will notify provider.

## 2020-09-23 NOTE — TOC Progression Note (Addendum)
Transition of Care [facility]) - Progression Note   Patient Details  Name: Jason Hartman MRN: [id number] Date of Birth: [birth year]  Transition of Care [facility]) CM/SW [place], LCSW Phone Number: 09/23/2020, 1:33 PM  Clinical Narrative: CSW spoke with patient's wife who selected [facility] for SNF as [facility] does not have beds available at this time. Per Claiborne Billings with [facility], PASRR is waived and the facility will accept the patient without it, but updated physician notes were uploaded to [facility] MUST. Patient can be accepted to a non-quarantine bed on 09/25/20 if patient is willing to receive a COVID booster at the hospital. Initial COVID vaccine dates are 05/04/19 and 05/25/19. Hospitalist updated. TOC to follow.  Expected Discharge Plan: [place] Barriers to Discharge: Continued Medical Work up  Expected Discharge Plan and Services Expected Discharge Plan: [place] Discharge Planning Services: CM Consult Living arrangements for the past 2 months: Single Family Home  Readmission Risk Interventions No flowsheet data found.

## 2020-09-23 NOTE — Plan of Care (Signed)
  Problem: Activity: Goal: Risk for activity intolerance will decrease Outcome: Progressing   Problem: Nutrition: Goal: Adequate nutrition will be maintained Outcome: Progressing   Problem: Coping: Goal: Level of anxiety will decrease Outcome: Progressing   Problem: Pain Managment: Goal: General experience of comfort will improve Outcome: Progressing   

## 2020-09-24 LAB — GLUCOSE, CAPILLARY
Glucose-Capillary: 125 mg/dL — ABNORMAL HIGH (ref 70–99)
Glucose-Capillary: 154 mg/dL — ABNORMAL HIGH (ref 70–99)
Glucose-Capillary: 152 mg/dL — ABNORMAL HIGH (ref 70–99)
Glucose-Capillary: 180 mg/dL — ABNORMAL HIGH (ref 70–99)

## 2020-09-24 MED ORDER — GABAPENTIN 300 MG PO CAPS
300.0000 mg | ORAL_CAPSULE | Freq: Every day | ORAL | Status: DC
  Administered 2020-09-24: 300 mg via ORAL
  Filled 2020-09-24: qty 1

## 2020-09-24 NOTE — Progress Notes (Signed)
PROGRESS NOTE  Jason Hartman:[id number] DOB: Nov 08, 1935   PCP: [name], MD  Patient is from: Home.  Lives with his wife.  DOA: 09/08/2020 LOS: 63  Chief complaints:  Chief Complaint  Patient presents with   Anxiety   Nausea     Brief Narrative / Interim history: 85 year old M with PMH of OSA, Parkinson disease, IDDM-2, CKD, RLS, chronic pain, anxiety and recent circumcision presenting with "anxiety" and diaphoresis, and admitted for acute encephalopathy in the setting of complicated UTI with acute urinary retention due to BOO.  Urine culture with Enterococcus and Acinetobacter.  Extensive encephalopathy work-up unrevealing except for UTI and possible polypharmacy.    Hospital course noteworthy of brief a flutter for which he was started on amiodarone, metoprolol and subcu Lovenox.  Patient's mental status improved.  Patient will complete course of antibiotics with IV Unasyn on 6/20.  Therapy recommended SNF.  Plan for transfer to SNF on 6/20.  Subjective: Seen and examined earlier this morning.  No major events overnight or this morning.  Main complaint is back itching bad and restless leg.  Denies pain, shortness of breath or GI symptoms.  Objective: Vitals:   09/23/20 1211 09/23/20 1409 09/23/20 2022 09/24/20 0407  BP: (!) 143/69 (!) 160/65 (!) 172/77 131/66  Pulse: 63 77 76 66  Resp: 16  16 16   Temp: 98.7 F (37.1 C) 97.7 F (36.5 C) 97.9 F (36.6 C) 97.8 F (36.6 C)  TempSrc: Oral Axillary Oral Oral  SpO2: 99% 97% 98% 99%  Weight:    79.8 kg  Height:        Intake/Output Summary (Last 24 hours) at 09/24/2020 1144 Last data filed at 09/24/2020 0900 Gross per 24 hour  Intake 2767.15 ml  Output 3275 ml  Net -507.85 ml   Filed Weights   09/22/20 0550 09/23/20 0633 09/24/20 0407  Weight: 80.9 kg 81.8 kg 79.8 kg    Examination:  GENERAL: No apparent distress.  Nontoxic. HEENT: MMM.  Vision and hearing grossly intact.  NECK: Supple.  No apparent JVD.   RESP: On RA.  No IWOB.  Fair aeration bilaterally. CVS:  RRR. Heart sounds normal.  ABD/GI/GU: BS+. Abd soft, NTND.  Indwelling Foley.  Small old blood in urethral meatus MSK/EXT:  Moves extremities. No apparent deformity. No edema.  SKIN: no apparent skin lesion or wound NEURO: Awake and alert.  Oriented to self and "hospital".  No apparent focal neuro deficit. PSYCH: Calm. Normal affect.   Procedures:  None  Microbiology summarized: GURKY-70 and influenza PCR nonreactive. Blood culture with staph epidermis-MecA/C positive in 1 out of 2 bottles Urine culture with pansensitive E.'s faecalis and Acinetobacter calcoaceticus/baumannii complex  Repeat blood cultures negative.  Assessment & Plan: Severe sepsis due to complicated UTI-POA.  Patient with bladder obstruction and urethral stricture followed by urology-had leukocytosis, AKI, encephalopathy and elevated troponin on presentation.  Urine culture as above.  Sepsis physiology resolved. -On IV Unasyn to complete treatment course through 6/20   Urinary retention due to bladder outlet obstruction and urethral stricture- Urology placed Foley on 6/10 using multiple dilators a catheter. Some bleeding from circumcision area in the setting of Lovenox for atrial flutter. -Continue holding Lovenox. -Given difficulty with Foley placement, will defer voiding trial to his urologist outpatient -Continue tadalafil   Acute metabolic encephalopathy: Likely due to the above, dehydration and polypharmacy.  He could have some degree of underlying cognitive impairment as well.  Extensive unrevealing encephalopathy work-up including CT head, EEG, TSH,  cortisol, B12, ammonia, and MRI brain.  Seems to be on multiple sedating medications including maximum dose of gabapentin.  Improving.  -Reorientation delirium precautions -Avoid or minimize sedating medications -Treat treatable causes. -Changed meds to p.o.  Atrial flutter RVR.  Converted to sinus  rhythm. Started on amiodarone, beta-blocker and Eliquis by cardiology.  TTE basically normal. -Continue amiodarone and metoprolol -Hold Lovenox in the setting of bleeding from circumcision site.  Controlled IDDM-2: A1c 6.5%. Recent Labs  Lab 09/23/20 1139 09/23/20 1348 09/23/20 1654 09/23/20 1948 09/24/20 0814  GLUCAP 174* 206* 119* 226* 125*  -Continue current insulin regimen  Anxiety/RLS: On extremely high-dose gabapentin at home.  RLS seems to be back. -Resume low-dose gabapentin at night   Abnormal blood culture: Staph hominis in 1 out of 2 bottles.  Felt to be contaminant.  Elevated D-dimer: VQ scan low probability.  LE [place] negative for DVT.   History of parkinsonism's -Continue home rotigotine patch.   Elevated troponin: Could be demand ischemia in the setting of atrial flutter and sepsis.  TTE reassuring.  Transient hypoxia: Resolved.  Essential hypertension: Normotensive. -Cardiac meds as above   AKI/azotemia: Resolved.  Rhabdomyolysis: Resolved. Secondary polycythemia: Likely from dehydration.  Resolved.  At risk for polypharmacy: Seems to take tramadol, Norco, very high-dose gabapentin at home. -Adjusted medications  COVID vaccination -Received first booster with Moderna on 6/18.  Class I obesity Body mass index is 30.2 kg/m.         DVT prophylaxis:    SCD  Code Status: Full code Family Communication: Patient and/or RN. Available if any question.  Level of care: Telemetry Status is: Inpatient  Remains inpatient appropriate because:Unsafe d/c plan  Dispo:  Patient From: Home  Planned Disposition: [place]  Medically stable for discharge: Yes         Consultants:  Cardiology Neurology Urology Pulmonology   Sch Meds:  Scheduled Meds:  amiodarone  200 mg Oral Daily   ferrous sulfate  325 mg Oral Daily   Gerhardt's butt cream   Topical BID   hydrocortisone cream   Topical BID   insulin aspart  0-9 Units  Subcutaneous TID WC   insulin glargine  5 Units Subcutaneous Daily   lidocaine  1 patch Transdermal Q24H   mouth rinse  15 mL Mouth Rinse BID   metoprolol tartrate  12.5 mg Oral BID   Rotigotine  1 patch Transdermal QPM   thiamine  100 mg Oral Daily   Continuous Infusions:  sodium chloride 50 mL/hr at 09/23/20 0954   ampicillin-sulbactam (UNASYN) IV 3 g (09/24/20 0941)   PRN Meds:.acetaminophen **OR** acetaminophen, bisacodyl, haloperidol lactate, hydrALAZINE, liver oil-zinc oxide, ofloxacin, ondansetron **OR** ondansetron (ZOFRAN) IV, senna-docusate, technetium albumin aggregated  Antimicrobials: Anti-infectives (From admission, onward)    Start     Dose/Rate Route Frequency Ordered Stop   09/21/20 1600  Ampicillin-Sulbactam (UNASYN) 3 g in sodium chloride 0.9 % 100 mL IVPB        3 g 200 mL/hr over 30 Minutes Intravenous Every 6 hours 09/21/20 1334 09/25/20 0359   09/18/20 1000  Ampicillin-Sulbactam (UNASYN) 3 g in sodium chloride 0.9 % 100 mL IVPB  Status:  Discontinued        3 g 200 mL/hr over 30 Minutes Intravenous Every 6 hours 09/18/20 0809 09/21/20 1334   09/15/20 1000  Ampicillin-Sulbactam (UNASYN) 3 g in sodium chloride 0.9 % 100 mL IVPB  Status:  Discontinued        3 g 200 mL/hr  over 30 Minutes Intravenous Every 8 hours 09/15/20 0821 09/18/20 0809   09/13/20 0000  cefTRIAXone (ROCEPHIN) 1 g in sodium chloride 0.9 % 100 mL IVPB  Status:  Discontinued        1 g 200 mL/hr over 30 Minutes Intravenous Every 24 hours 09/12/20 2339 09/15/20 0809   09/09/20 0115  aztreonam (AZACTAM) 1 g in sodium chloride 0.9 % 100 mL IVPB        1 g 200 mL/hr over 30 Minutes Intravenous  Once 09/09/20 0106 09/09/20 0307        I have personally reviewed the following labs and images: CBC: Recent Labs  Lab 09/18/20 0510 09/19/20 0503 09/22/20 1417  WBC 12.0* 8.5 8.4  HGB 16.4 15.0 14.2  HCT 55.0* 49.1 45.4  MCV 95.8 94.1 91.0  PLT 162 154 185   BMP &GFR Recent Labs  Lab  09/18/20 0510 09/19/20 0503 09/20/20 0926  NA 146* 139 141  K 4.1 3.9 3.8  CL 115* 106 107  CO2 25 26 29   GLUCOSE 159* 117* 159*  BUN 71* 47* 25*  CREATININE 1.36* 1.00 1.07  CALCIUM 8.6* 8.2* 8.5*   Estimated Creatinine Clearance: 50.7 mL/min (by C-G formula based on SCr of 1.07 mg/dL). Liver & Pancreas: No results for input(s): AST, ALT, ALKPHOS, BILITOT, PROT, ALBUMIN in the last 168 hours. No results for input(s): LIPASE, AMYLASE in the last 168 hours. No results for input(s): AMMONIA in the last 168 hours. Diabetic: No results for input(s): HGBA1C in the last 72 hours. Recent Labs  Lab 09/23/20 1139 09/23/20 1348 09/23/20 1654 09/23/20 1948 09/24/20 0814  GLUCAP 174* 206* 119* 226* 125*   Cardiac Enzymes: No results for input(s): CKTOTAL, CKMB, CKMBINDEX, TROPONINI in the last 168 hours. No results for input(s): PROBNP in the last 8760 hours. Coagulation Profile: Recent Labs  Lab 09/22/20 1417  INR 1.2   Thyroid Function Tests: No results for input(s): TSH, T4TOTAL, FREET4, T3FREE, THYROIDAB in the last 72 hours. Lipid Profile: No results for input(s): CHOL, HDL, LDLCALC, TRIG, CHOLHDL, LDLDIRECT in the last 72 hours. Anemia Panel: No results for input(s): VITAMINB12, FOLATE, FERRITIN, TIBC, IRON, RETICCTPCT in the last 72 hours. Urine analysis:    Component Value Date/Time   COLORURINE YELLOW 09/12/2020 2253   APPEARANCEUR HAZY (A) 09/12/2020 2253   LABSPEC 1.005 09/12/2020 2253   PHURINE 6.0 09/12/2020 2253   GLUCOSEU >=500 (A) 09/12/2020 2253   HGBUR LARGE (A) 09/12/2020 2253   BILIRUBINUR NEGATIVE 09/12/2020 2253   KETONESUR NEGATIVE 09/12/2020 2253   PROTEINUR NEGATIVE 09/12/2020 2253   UROBILINOGEN 0.2 12/04/2013 0210   NITRITE NEGATIVE 09/12/2020 2253   LEUKOCYTESUR LARGE (A) 09/12/2020 2253   Sepsis Labs: Invalid input(s): PROCALCITONIN, [place]  Microbiology: No results found for this or any previous visit (from the past 240  hour(s)).   Radiology Studies: No results found.     Tionne Dayhoff T. [place]  If 7PM-7AM, please contact night-coverage [email or web address] 09/24/2020, 11:44 AM

## 2020-09-25 DIAGNOSIS — F411 Generalized anxiety disorder: Secondary | ICD-10-CM | POA: Diagnosis not present

## 2020-09-25 DIAGNOSIS — G20 Parkinson's disease: Secondary | ICD-10-CM | POA: Diagnosis not present

## 2020-09-25 DIAGNOSIS — I6523 Occlusion and stenosis of bilateral carotid arteries: Secondary | ICD-10-CM | POA: Diagnosis not present

## 2020-09-25 DIAGNOSIS — R262 Difficulty in walking, not elsewhere classified: Secondary | ICD-10-CM | POA: Diagnosis not present

## 2020-09-25 DIAGNOSIS — G2581 Restless legs syndrome: Secondary | ICD-10-CM | POA: Diagnosis present

## 2020-09-25 DIAGNOSIS — G319 Degenerative disease of nervous system, unspecified: Secondary | ICD-10-CM | POA: Diagnosis not present

## 2020-09-25 DIAGNOSIS — N179 Acute kidney failure, unspecified: Secondary | ICD-10-CM | POA: Diagnosis present

## 2020-09-25 DIAGNOSIS — D509 Iron deficiency anemia, unspecified: Secondary | ICD-10-CM | POA: Diagnosis present

## 2020-09-25 DIAGNOSIS — I739 Peripheral vascular disease, unspecified: Secondary | ICD-10-CM | POA: Diagnosis not present

## 2020-09-25 DIAGNOSIS — Z9189 Other specified personal risk factors, not elsewhere classified: Secondary | ICD-10-CM | POA: Diagnosis not present

## 2020-09-25 DIAGNOSIS — G9340 Encephalopathy, unspecified: Secondary | ICD-10-CM | POA: Diagnosis not present

## 2020-09-25 DIAGNOSIS — G4733 Obstructive sleep apnea (adult) (pediatric): Secondary | ICD-10-CM | POA: Diagnosis present

## 2020-09-25 DIAGNOSIS — E43 Unspecified severe protein-calorie malnutrition: Secondary | ICD-10-CM | POA: Diagnosis not present

## 2020-09-25 DIAGNOSIS — Z794 Long term (current) use of insulin: Secondary | ICD-10-CM | POA: Diagnosis not present

## 2020-09-25 DIAGNOSIS — U071 COVID-19: Secondary | ICD-10-CM | POA: Diagnosis not present

## 2020-09-25 DIAGNOSIS — G609 Hereditary and idiopathic neuropathy, unspecified: Secondary | ICD-10-CM | POA: Diagnosis present

## 2020-09-25 DIAGNOSIS — R4189 Other symptoms and signs involving cognitive functions and awareness: Secondary | ICD-10-CM | POA: Diagnosis not present

## 2020-09-25 DIAGNOSIS — N1832 Chronic kidney disease, stage 3b: Secondary | ICD-10-CM | POA: Diagnosis present

## 2020-09-25 DIAGNOSIS — E1142 Type 2 diabetes mellitus with diabetic polyneuropathy: Secondary | ICD-10-CM | POA: Diagnosis not present

## 2020-09-25 DIAGNOSIS — F0391 Unspecified dementia with behavioral disturbance: Secondary | ICD-10-CM | POA: Diagnosis not present

## 2020-09-25 DIAGNOSIS — M545 Low back pain, unspecified: Secondary | ICD-10-CM | POA: Diagnosis not present

## 2020-09-25 DIAGNOSIS — N35919 Unspecified urethral stricture, male, unspecified site: Secondary | ICD-10-CM | POA: Diagnosis not present

## 2020-09-25 DIAGNOSIS — A4159 Other Gram-negative sepsis: Secondary | ICD-10-CM | POA: Diagnosis present

## 2020-09-25 DIAGNOSIS — N390 Urinary tract infection, site not specified: Secondary | ICD-10-CM | POA: Diagnosis present

## 2020-09-25 DIAGNOSIS — M1611 Unilateral primary osteoarthritis, right hip: Secondary | ICD-10-CM | POA: Diagnosis not present

## 2020-09-25 DIAGNOSIS — A419 Sepsis, unspecified organism: Secondary | ICD-10-CM | POA: Diagnosis not present

## 2020-09-25 DIAGNOSIS — N320 Bladder-neck obstruction: Secondary | ICD-10-CM | POA: Diagnosis not present

## 2020-09-25 DIAGNOSIS — I672 Cerebral atherosclerosis: Secondary | ICD-10-CM | POA: Diagnosis not present

## 2020-09-25 DIAGNOSIS — Z452 Encounter for adjustment and management of vascular access device: Secondary | ICD-10-CM | POA: Diagnosis not present

## 2020-09-25 DIAGNOSIS — M549 Dorsalgia, unspecified: Secondary | ICD-10-CM | POA: Diagnosis not present

## 2020-09-25 DIAGNOSIS — R0902 Hypoxemia: Secondary | ICD-10-CM | POA: Diagnosis not present

## 2020-09-25 DIAGNOSIS — Z7401 Bed confinement status: Secondary | ICD-10-CM | POA: Diagnosis not present

## 2020-09-25 DIAGNOSIS — Z743 Need for continuous supervision: Secondary | ICD-10-CM | POA: Diagnosis not present

## 2020-09-25 DIAGNOSIS — M25551 Pain in right hip: Secondary | ICD-10-CM | POA: Diagnosis not present

## 2020-09-25 DIAGNOSIS — I129 Hypertensive chronic kidney disease with stage 1 through stage 4 chronic kidney disease, or unspecified chronic kidney disease: Secondary | ICD-10-CM | POA: Diagnosis not present

## 2020-09-25 DIAGNOSIS — R0989 Other specified symptoms and signs involving the circulatory and respiratory systems: Secondary | ICD-10-CM | POA: Diagnosis not present

## 2020-09-25 DIAGNOSIS — E1122 Type 2 diabetes mellitus with diabetic chronic kidney disease: Secondary | ICD-10-CM | POA: Diagnosis present

## 2020-09-25 DIAGNOSIS — E785 Hyperlipidemia, unspecified: Secondary | ICD-10-CM | POA: Diagnosis present

## 2020-09-25 DIAGNOSIS — S300XXA Contusion of lower back and pelvis, initial encounter: Secondary | ICD-10-CM | POA: Diagnosis not present

## 2020-09-25 DIAGNOSIS — M25512 Pain in left shoulder: Secondary | ICD-10-CM | POA: Diagnosis not present

## 2020-09-25 DIAGNOSIS — S199XXA Unspecified injury of neck, initial encounter: Secondary | ICD-10-CM | POA: Diagnosis not present

## 2020-09-25 DIAGNOSIS — R531 Weakness: Secondary | ICD-10-CM

## 2020-09-25 DIAGNOSIS — M50321 Other cervical disc degeneration at C4-C5 level: Secondary | ICD-10-CM | POA: Diagnosis not present

## 2020-09-25 DIAGNOSIS — I2510 Atherosclerotic heart disease of native coronary artery without angina pectoris: Secondary | ICD-10-CM | POA: Diagnosis present

## 2020-09-25 DIAGNOSIS — E860 Dehydration: Secondary | ICD-10-CM | POA: Diagnosis not present

## 2020-09-25 DIAGNOSIS — Z1612 Extended spectrum beta lactamase (ESBL) resistance: Secondary | ICD-10-CM | POA: Diagnosis present

## 2020-09-25 DIAGNOSIS — K219 Gastro-esophageal reflux disease without esophagitis: Secondary | ICD-10-CM | POA: Diagnosis present

## 2020-09-25 DIAGNOSIS — I4892 Unspecified atrial flutter: Secondary | ICD-10-CM | POA: Diagnosis present

## 2020-09-25 DIAGNOSIS — Z79899 Other long term (current) drug therapy: Secondary | ICD-10-CM | POA: Diagnosis not present

## 2020-09-25 DIAGNOSIS — R4182 Altered mental status, unspecified: Secondary | ICD-10-CM | POA: Diagnosis not present

## 2020-09-25 DIAGNOSIS — S3992XA Unspecified injury of lower back, initial encounter: Secondary | ICD-10-CM | POA: Diagnosis present

## 2020-09-25 DIAGNOSIS — R1312 Dysphagia, oropharyngeal phase: Secondary | ICD-10-CM | POA: Diagnosis not present

## 2020-09-25 DIAGNOSIS — J9811 Atelectasis: Secondary | ICD-10-CM | POA: Diagnosis not present

## 2020-09-25 DIAGNOSIS — Z043 Encounter for examination and observation following other accident: Secondary | ICD-10-CM | POA: Diagnosis not present

## 2020-09-25 DIAGNOSIS — W19XXXA Unspecified fall, initial encounter: Secondary | ICD-10-CM | POA: Diagnosis not present

## 2020-09-25 DIAGNOSIS — R339 Retention of urine, unspecified: Secondary | ICD-10-CM | POA: Diagnosis not present

## 2020-09-25 DIAGNOSIS — R41841 Cognitive communication deficit: Secondary | ICD-10-CM | POA: Diagnosis not present

## 2020-09-25 DIAGNOSIS — R404 Transient alteration of awareness: Secondary | ICD-10-CM | POA: Diagnosis not present

## 2020-09-25 DIAGNOSIS — Y92002 Bathroom of unspecified non-institutional (private) residence single-family (private) house as the place of occurrence of the external cause: Secondary | ICD-10-CM | POA: Diagnosis not present

## 2020-09-25 DIAGNOSIS — E119 Type 2 diabetes mellitus without complications: Secondary | ICD-10-CM | POA: Diagnosis not present

## 2020-09-25 DIAGNOSIS — I480 Paroxysmal atrial fibrillation: Secondary | ICD-10-CM | POA: Diagnosis present

## 2020-09-25 DIAGNOSIS — Z7901 Long term (current) use of anticoagulants: Secondary | ICD-10-CM | POA: Diagnosis not present

## 2020-09-25 DIAGNOSIS — S0990XA Unspecified injury of head, initial encounter: Secondary | ICD-10-CM | POA: Diagnosis not present

## 2020-09-25 DIAGNOSIS — L209 Atopic dermatitis, unspecified: Secondary | ICD-10-CM | POA: Diagnosis not present

## 2020-09-25 DIAGNOSIS — M5031 Other cervical disc degeneration,  high cervical region: Secondary | ICD-10-CM | POA: Diagnosis not present

## 2020-09-25 DIAGNOSIS — I10 Essential (primary) hypertension: Secondary | ICD-10-CM | POA: Diagnosis not present

## 2020-09-25 DIAGNOSIS — R6520 Severe sepsis without septic shock: Secondary | ICD-10-CM | POA: Diagnosis present

## 2020-09-25 DIAGNOSIS — M25519 Pain in unspecified shoulder: Secondary | ICD-10-CM | POA: Diagnosis not present

## 2020-09-25 DIAGNOSIS — R9082 White matter disease, unspecified: Secondary | ICD-10-CM | POA: Diagnosis not present

## 2020-09-25 DIAGNOSIS — G9341 Metabolic encephalopathy: Secondary | ICD-10-CM | POA: Diagnosis present

## 2020-09-25 DIAGNOSIS — I130 Hypertensive heart and chronic kidney disease with heart failure and stage 1 through stage 4 chronic kidney disease, or unspecified chronic kidney disease: Secondary | ICD-10-CM | POA: Diagnosis present

## 2020-09-25 DIAGNOSIS — G894 Chronic pain syndrome: Secondary | ICD-10-CM | POA: Diagnosis not present

## 2020-09-25 DIAGNOSIS — R410 Disorientation, unspecified: Secondary | ICD-10-CM | POA: Diagnosis not present

## 2020-09-25 DIAGNOSIS — L299 Pruritus, unspecified: Secondary | ICD-10-CM | POA: Diagnosis not present

## 2020-09-25 DIAGNOSIS — S8001XA Contusion of right knee, initial encounter: Secondary | ICD-10-CM | POA: Diagnosis not present

## 2020-09-25 DIAGNOSIS — I119 Hypertensive heart disease without heart failure: Secondary | ICD-10-CM | POA: Diagnosis not present

## 2020-09-25 DIAGNOSIS — F419 Anxiety disorder, unspecified: Secondary | ICD-10-CM | POA: Diagnosis not present

## 2020-09-25 DIAGNOSIS — M50323 Other cervical disc degeneration at C6-C7 level: Secondary | ICD-10-CM | POA: Diagnosis not present

## 2020-09-25 DIAGNOSIS — L89312 Pressure ulcer of right buttock, stage 2: Secondary | ICD-10-CM | POA: Diagnosis present

## 2020-09-25 DIAGNOSIS — N1831 Chronic kidney disease, stage 3a: Secondary | ICD-10-CM | POA: Diagnosis not present

## 2020-09-25 DIAGNOSIS — E46 Unspecified protein-calorie malnutrition: Secondary | ICD-10-CM | POA: Diagnosis not present

## 2020-09-25 DIAGNOSIS — W1830XA Fall on same level, unspecified, initial encounter: Secondary | ICD-10-CM | POA: Diagnosis not present

## 2020-09-25 DIAGNOSIS — M542 Cervicalgia: Secondary | ICD-10-CM | POA: Diagnosis not present

## 2020-09-25 DIAGNOSIS — E1165 Type 2 diabetes mellitus with hyperglycemia: Secondary | ICD-10-CM | POA: Diagnosis present

## 2020-09-25 DIAGNOSIS — S8991XA Unspecified injury of right lower leg, initial encounter: Secondary | ICD-10-CM | POA: Diagnosis present

## 2020-09-25 DIAGNOSIS — L22 Diaper dermatitis: Secondary | ICD-10-CM | POA: Diagnosis not present

## 2020-09-25 DIAGNOSIS — M25561 Pain in right knee: Secondary | ICD-10-CM | POA: Diagnosis not present

## 2020-09-25 DIAGNOSIS — R1310 Dysphagia, unspecified: Secondary | ICD-10-CM | POA: Diagnosis not present

## 2020-09-25 DIAGNOSIS — M47816 Spondylosis without myelopathy or radiculopathy, lumbar region: Secondary | ICD-10-CM | POA: Diagnosis not present

## 2020-09-25 DIAGNOSIS — Z87891 Personal history of nicotine dependence: Secondary | ICD-10-CM | POA: Diagnosis not present

## 2020-09-25 DIAGNOSIS — I5032 Chronic diastolic (congestive) heart failure: Secondary | ICD-10-CM | POA: Diagnosis present

## 2020-09-25 DIAGNOSIS — E1140 Type 2 diabetes mellitus with diabetic neuropathy, unspecified: Secondary | ICD-10-CM | POA: Diagnosis not present

## 2020-09-25 DIAGNOSIS — M546 Pain in thoracic spine: Secondary | ICD-10-CM | POA: Diagnosis not present

## 2020-09-25 DIAGNOSIS — Z20822 Contact with and (suspected) exposure to covid-19: Secondary | ICD-10-CM | POA: Diagnosis present

## 2020-09-25 DIAGNOSIS — M25562 Pain in left knee: Secondary | ICD-10-CM | POA: Diagnosis not present

## 2020-09-25 DIAGNOSIS — T83511A Infection and inflammatory reaction due to indwelling urethral catheter, initial encounter: Secondary | ICD-10-CM | POA: Diagnosis present

## 2020-09-25 DIAGNOSIS — Y846 Urinary catheterization as the cause of abnormal reaction of the patient, or of later complication, without mention of misadventure at the time of the procedure: Secondary | ICD-10-CM | POA: Diagnosis present

## 2020-09-25 DIAGNOSIS — G8929 Other chronic pain: Secondary | ICD-10-CM | POA: Diagnosis not present

## 2020-09-25 DIAGNOSIS — M6281 Muscle weakness (generalized): Secondary | ICD-10-CM | POA: Diagnosis not present

## 2020-09-25 DIAGNOSIS — K5901 Slow transit constipation: Secondary | ICD-10-CM | POA: Diagnosis not present

## 2020-09-25 DIAGNOSIS — R338 Other retention of urine: Secondary | ICD-10-CM | POA: Diagnosis not present

## 2020-09-25 DIAGNOSIS — R7989 Other specified abnormal findings of blood chemistry: Secondary | ICD-10-CM

## 2020-09-25 DIAGNOSIS — Z8554 Personal history of malignant neoplasm of ureter: Secondary | ICD-10-CM | POA: Diagnosis not present

## 2020-09-25 DIAGNOSIS — Z7984 Long term (current) use of oral hypoglycemic drugs: Secondary | ICD-10-CM | POA: Diagnosis not present

## 2020-09-25 DIAGNOSIS — F410 Panic disorder [episodic paroxysmal anxiety] without agoraphobia: Secondary | ICD-10-CM | POA: Diagnosis not present

## 2020-09-25 DIAGNOSIS — R739 Hyperglycemia, unspecified: Secondary | ICD-10-CM | POA: Diagnosis not present

## 2020-09-25 DIAGNOSIS — R319 Hematuria, unspecified: Secondary | ICD-10-CM | POA: Diagnosis not present

## 2020-09-25 DIAGNOSIS — R5381 Other malaise: Secondary | ICD-10-CM | POA: Diagnosis not present

## 2020-09-25 DIAGNOSIS — N401 Enlarged prostate with lower urinary tract symptoms: Secondary | ICD-10-CM | POA: Diagnosis not present

## 2020-09-25 DIAGNOSIS — W06XXXA Fall from bed, initial encounter: Secondary | ICD-10-CM | POA: Diagnosis not present

## 2020-09-25 LAB — SARS CORONAVIRUS 2 (TAT 6-24 HRS): SARS Coronavirus 2: NEGATIVE

## 2020-09-25 LAB — GLUCOSE, CAPILLARY
Glucose-Capillary: 212 mg/dL — ABNORMAL HIGH (ref 70–99)
Glucose-Capillary: 163 mg/dL — ABNORMAL HIGH (ref 70–99)

## 2020-09-25 MED ORDER — APIXABAN 5 MG PO TABS: 5.0000 mg | ORAL_TABLET | Freq: Two times a day (BID) | ORAL | Status: DC

## 2020-09-25 MED ORDER — THIAMINE HCL 100 MG PO TABS: 100.0000 mg | ORAL_TABLET | Freq: Every day | ORAL | Status: AC

## 2020-09-25 MED ORDER — METOPROLOL TARTRATE 25 MG PO TABS: 12.5000 mg | ORAL_TABLET | Freq: Two times a day (BID) | ORAL | Status: AC

## 2020-09-25 MED ORDER — AMIODARONE HCL 200 MG PO TABS: 200.0000 mg | ORAL_TABLET | Freq: Every day | ORAL | Status: AC

## 2020-09-25 MED ORDER — SENNOSIDES-DOCUSATE SODIUM 8.6-50 MG PO TABS: 1.0000 | ORAL_TABLET | Freq: Two times a day (BID) | ORAL | Status: AC | PRN

## 2020-09-25 MED ORDER — LANTUS SOLOSTAR 100 UNIT/ML ~~LOC~~ SOPN: 8.0000 [IU] | PEN_INJECTOR | Freq: Two times a day (BID) | SUBCUTANEOUS | 11 refills | Status: AC

## 2020-09-25 MED ORDER — HYDROXYZINE HCL 25 MG PO TABS
25.0000 mg | ORAL_TABLET | Freq: Once | ORAL | Status: AC
  Administered 2020-09-25: 25 mg via ORAL
  Filled 2020-09-25: qty 1

## 2020-09-25 MED ORDER — GABAPENTIN 300 MG PO CAPS: 300.0000 mg | ORAL_CAPSULE | Freq: Every day | ORAL | Status: AC

## 2020-09-25 NOTE — TOC Transition Note (Signed)
Transition of Care [facility]) - CM/SW Discharge Note   Patient Details  Name: Jason Hartman MRN: [id number] Date of Birth: [birth year]  [facility] CM/SW Contact:  Dessa Phi, RN Phone Number: 09/25/2020, 11:30 AM   Clinical Narrative: d/c today [facility] rep Juliann Pulse able to accept-gong to rm#[phone],tel for nsg report [phone]. PTAR called. No further CM needs.     Final next level of care: Skilled Nursing Facility Barriers to Discharge: No Barriers Identified   Patient Goals and CMS Choice Patient states their goals for this hospitalization and ongoing recovery are:: go to rehab CMS Medicare.gov Compare Post Acute Care list provided to:: Patient    Discharge Placement PASRR number recieved:  (Pasrr pending-waived)              Patient to be transferred to facility by: [place] Name of family member notified: Jacinto Halim spouse [phone] Patient and family notified of of transfer: 09/25/20  Discharge Plan and Services   Discharge Planning Services: CM Consult                                 Social Determinants of Health ([place]) Interventions     Readmission Risk Interventions No flowsheet data found.

## 2020-09-25 NOTE — Progress Notes (Signed)
Report given to [place], Therapist, sports at [facility]. Waiting on PTAR.

## 2020-09-25 NOTE — Progress Notes (Signed)
Physical Therapy Treatment Patient Details Name: Jason Hartman MRN: [id number] DOB: [birth year] Today's Date: 09/25/2020    History of Present Illness 85 yo male admitted with anxiety, afib, ams, agitation requiring sedation. Head CT negative; MRI brain 6/14: No acute abnormality; MRI c-spine 6/14: Cervical spondylosis, no acute abnormality. hx of osa, parkinson's, ckd, dm    PT Comments    General Comments: AxO x 2 following all directions but present with poor insight.  "I can't believe how weak I am.  I use to do 100 push ups" Attempted to transfer pt OOB to [facility] was unsuccessful.  General bed mobility comments: attempted to sit pt EOB was difficult.  Required MAX Assist present with poor self correction to midline with inability to even plant B feet on floor.  Severe posterior lean RIGHT lean.  Poor coordination and motor control.  Attempted OOB to [facility] was unsuccessful as pt was unable to weight shift forward and unable to clear hips off bed.  Asissted back to bed + 2 total Assist esp to scoot to [facility]. Pt will need ST Rehab at SNF  Follow Up Recommendations  SNF     Equipment Recommendations  None recommended by PT    Recommendations for Other Services       Precautions / Restrictions Precautions Precautions: Fall    Mobility  Bed Mobility Overal bed mobility: Needs Assistance Bed Mobility: Supine to Sit;Sit to Supine     Supine to sit: Max assist Sit to supine: Total assist   General bed mobility comments: attempted to sit pt EOB was difficult.  Required MAX Assist present with poor self correction to midline with inability to even plant B feet on floor.  Severe posterior lean RIGHT lean.  Poor coordination and motor control.  Attempted OOB to [facility] was unsuccessful as pt was unable to weight shift forward and unable to clear hips off bed.  Asissted back to bed + 2 total Assist esp to scoot to [facility].    Transfers                    Ambulation/Gait                  Stairs             Wheelchair Mobility    Modified Rankin (Stroke Patients Only)       Balance                                            Cognition Arousal/Alertness: Awake/alert Behavior During Therapy: WFL for tasks assessed/performed Overall Cognitive Status: Within Functional Limits for tasks assessed                                 General Comments: AxO x 2 following all directions but present with poor insight.  "I can't believe how weak I am.  I use to do 100 push ups"      Exercises      General Comments        Pertinent Vitals/Pain Pain Assessment: Faces Faces Pain Scale: Hurts little more Pain Location: L shoulder and back Pain Descriptors / Indicators: Aching;Discomfort;Grimacing Pain Intervention(s): Monitored during session;Repositioned    Home Living  Prior Function            PT Goals (current goals can now be found in the care plan section) Progress towards PT goals: Progressing toward goals    Frequency    Min 3X/week      PT Plan Current plan remains appropriate    Co-evaluation              AM-PAC PT "6 Clicks" Mobility   Outcome Measure  Help needed turning from your back to your side while in a flat bed without using bedrails?: A Lot Help needed moving from lying on your back to sitting on the side of a flat bed without using bedrails?: A Lot Help needed moving to and from a bed to a chair (including a wheelchair)?: Total Help needed standing up from a chair using your arms (e.g., wheelchair or bedside chair)?: Total Help needed to walk in hospital room?: Total Help needed climbing 3-5 steps with a railing? : Total 6 Click Score: 8    End of Session Equipment Utilized During Treatment: Gait belt Activity Tolerance: Patient tolerated treatment well;Patient limited by fatigue Patient left: in bed;with call bell/phone within reach Nurse  Communication: Mobility status;Need for lift equipment PT Visit Diagnosis: Difficulty in walking, not elsewhere classified (R26.2)     Time: 0881-1031 PT Time Calculation (min) (ACUTE ONLY): 25 min  Charges:  $Therapeutic Activity: 23-37 mins                     Rica Koyanagi  PTA Acute  Rehabilitation Services Pager      [phone] Office      [phone]

## 2020-09-25 NOTE — Consult Note (Signed)
[facility] Inpatient Consult   09/25/2020  JEFFERSON FULLAM [birth year] [id number]  [facility] [ACO] Patient: CMS DCE   Patient screened for hospitalization with noted extreme high risk score for unplanned readmission risk and  to assess for potential [place] Management service needs for post hospital transition. Review of patient's medical record reveals patient current disposition plan is for a skilled nursing facility level of care.    No [facility] Care Management follow up needs at this time.  Netta Cedars, MSN, [facility] Liaison Nurse Mobile Phone [phone]  Toll free office [phone]

## 2020-09-25 NOTE — Discharge Summary (Signed)
Physician Discharge Summary  Jason Hartman XIP:[id number] DOB: 1935/11/22 DOA: 09/08/2020  PCP: [name], MD  Admit date: 09/08/2020 Discharge date: 09/25/2020  Admitted From: Home. Disposition: SNF  Recommendations for Outpatient Follow-up:  Follow ups as below. Please obtain CBC/BMP/Mag at follow up Outpatient follow-up with his urologist in [facility] in 1 week Cardiology follow-up as below Please follow up on the following pending results: None    Discharge Condition: Stable CODE STATUS: Full code   Follow-up Information     Almyra Deforest, PA Follow up.   Specialties: Cardiology, Radiology Why: [facility] location - Tuesday October 17, 2020 2:15 PM (Arrive by 2:00 PM). Jason Hartman is one of the PAs that works closely with our cardiology team. Contact information: [address] [place] [address] [phone]                  Hospital Course: 85 year old M with PMH of OSA, Parkinson disease, IDDM-2, CKD, RLS, chronic pain, anxiety and and urethral stricture presenting with "anxiety" and diaphoresis, and admitted for acute encephalopathy in the setting of complicated UTI with acute urinary retention due to BOO.  Urine culture with Enterococcus and Acinetobacter.  Extensive encephalopathy work-up unrevealing except for UTI and possible polypharmacy.     Nursing staff was not able to place ureteral Foley catheter.  Urology placed Foley on 6/10 using multiple dilators. Patient completed antibiotic course with IV Unasyn.  Patient's mental status improved after discontinuing sedating medications and treatment of his UTI.  Patient is discharged with indwelling Foley catheter.  He needs follow-up with his urologist in [facility] in about a week for voiding trial.  Hospital course noteworthy of brief atrial flutter with RVR for which he was started on amiodarone, metoprolol and subcu Lovenox by cardiology.  Lovenox was discontinued due to bleeding from  circumcision site.  Bleeding was stopped.  Patient was started Eliquis in about 4 days.   Therapy recommended SNF.  Plan for transfer to SNF on 6/20.  See individual problem list below for more hospital course.  Discharge Diagnoses:  Severe sepsis due to complicated UTI-POA.  Patient with bladder outlet obstruction and urethral stricture followed by urology-had leukocytosis, AKI, encephalopathy and elevated troponin on presentation.  Urine culture as above.  Sepsis physiology resolved. -Completed antibiotic course with IV Unasyn on 6/20     Urinary retention due to bladder outlet obstruction and urethral stricture- Urology placed Foley on 6/10 using multiple dilators catheter.  -Given difficulty with Foley placement, will defer voiding trial to his urologist outpatient -Continue tadalafil   Acute metabolic encephalopathy: Likely due to sepsis, dehydration and polypharmacy.  He could have some degree of underlying cognitive impairment as well.  Extensive unrevealing encephalopathy work-up including CT head, EEG, TSH, cortisol, B12, ammonia, and MRI brain.  Seems to be on multiple sedating medications including maximum dose of gabapentin.  Encephalopathy improved.  He is awake and alert.  He is oriented to self and place. -Reorientation delirium precautions -Avoid or minimize sedating medications    Atrial flutter RVR.  Converted to sinus rhythm. Started on amiodarone, beta-blocker and Eliquis by cardiology.  TTE basically normal. -Continue amiodarone and metoprolol -Resume Eliquis in 4 days -Outpatient follow-up with cardiology   Controlled IDDM-2: A1c 6.5% on 08/25/2020. -Continue home Ozempic and metformin -Decreased Lantus to 8 units twice daily -Further adjustment as appropriate.   Anxiety/RLS: On extremely high-dose gabapentin at home.  RLS seems to be back. -Decreased gabapentin to 300  mg nightly due to encephalopathy.   Abnormal blood culture: Staph hominis in 1 out of 2  bottles.  Felt to be contaminant.   Elevated D-dimer: VQ scan low probability.  LE [place] negative for DVT.   History of parkinsonism's -Continue home rotigotine patch.   Elevated troponin: Could be demand ischemia in the setting of atrial flutter and sepsis.  TTE reassuring.  Cardiology signed off.   Transient hypoxia: Resolved.   Essential hypertension: Normotensive. -Cardiac meds as above   AKI/azotemia: Resolved.   Rhabdomyolysis: Resolved. Secondary polycythemia: Likely from dehydration.  Resolved.   At risk for polypharmacy: Seems to take tramadol, Norco, very high-dose gabapentin at home. -Adjusted medications   COVID vaccination -Received first booster with Moderna on 6/18.  Generalized weakness/physical deconditioning -Continue therapy at SNF.   Class I obesity Body mass index is 30.2 kg/m.  -Continue Ozempic.          Discharge Exam: Vitals:   09/24/20 2141 09/25/20 0501  BP: (!) 154/80 125/71  Pulse: 77 77  Resp: 20 (!) 24  Temp: 98.2 F (36.8 C) 97.9 F (36.6 C)  SpO2: 97% 98%    GENERAL: No apparent distress.  Nontoxic. HEENT: MMM.  Vision and hearing grossly intact.  NECK: Supple.  No apparent JVD.  RESP: On RA.  No IWOB.  Fair aeration bilaterally. CVS:  RRR. Heart sounds normal.  ABD/GI/GU: Bowel sounds present. Soft. Non tender.  Indwelling Foley. MSK/EXT:  Moves extremities. No apparent deformity. No edema.  SKIN: no apparent skin lesion or wound NEURO: Awake and alert.  Oriented to self and place.  No apparent focal neuro deficit. PSYCH: Calm. Normal affect.   Discharge Instructions  Discharge Instructions     Diet - low sodium heart healthy   Complete by: As directed    Diet Carb Modified   Complete by: As directed    Increase activity slowly   Complete by: As directed    No wound care   Complete by: As directed       Allergies as of 09/25/2020       Reactions   Oxycodone Anaphylaxis   Other reaction(s): Other (See  Comments) Cannot recall specifics   Iodinated Diagnostic Agents Hives   alleric to renografin,isovue & omnipaque, hives, requires 13 hr prep//a.calhoun, Onset Date: [id number]   Iodine Hives, Rash   alleric to renografin,isovue & omnipaque, hives, requires 13 hr prep//a.[email or web address], Onset Date: [id number] allergic to renografin,isovue & omnipaque, hives, requires 13 hr prep//a.calhoun, Onset Date: [id number]   Linezolid Hives, Rash      Methadone Hcl Other (See Comments)   HALLUCINATIONS   Promethazine Other (See Comments)   Mental status change, DELIRIUM Other reaction(s): erratic behavior Other reaction(s): Other (See Comments) Mental status change DELIRIUM (pulled out IV)   Morphine Sulfate Other (See Comments)   Pt not sure about allergy    Other Hives   Other reaction(s): erratic behanvior Other reaction(s): erratic behavior   Oxycodone Hcl Other (See Comments)   Pt not sure about allergy    Quetiapine Other (See Comments)   Pt not sure about allergy  Other reaction(s): Other (See Comments) Pt not sure about allergy  Pt not sure about allergy    Statins    Other reaction(s): muscle weakness Other reaction(s): muscle weakness   Zolpidem Other (See Comments)   Other reaction(s): erratic behanvior Other reaction(s): Delusions (intolerance) Altered mental status   Atorvastatin Itching, Other (See Comments)   Tired, weakness   Carbidopa-levodopa Anxiety  Chlorhexidine Gluconate [chlorhexidine] Hives, Rash   Clindamycin Rash   Colesevelam Other (See Comments)   tired   Doxazosin Rash   Lovastatin Other (See Comments)   Tired, nervousness   Rosuvastatin Other (See Comments)   Tired, weakness        Medication List     STOP taking these medications    aspirin 81 MG EC tablet   busPIRone 10 MG tablet Commonly known as: BUSPAR   chlorthalidone 25 MG tablet Commonly known as: HYGROTON   co-enzyme Q-10 50 MG capsule   diltiazem 120 MG 24 hr capsule Commonly known  as: CARDIZEM CD   DULoxetine 20 MG capsule Commonly known as: CYMBALTA   Fish Oil 1000 MG Caps   gabapentin 600 MG tablet Commonly known as: NEURONTIN Replaced by: gabapentin 300 MG capsule   HYDROcodone-acetaminophen 5-325 MG tablet Commonly known as: NORCO/VICODIN   nitroGLYCERIN 0.4 MG SL tablet Commonly known as: NITROSTAT   traMADol 50 MG tablet Commonly known as: ULTRAM   valsartan 320 MG tablet Commonly known as: DIOVAN       TAKE these medications    acetaminophen 500 MG tablet Commonly known as: TYLENOL Take 500 mg by mouth every 6 (six) hours as needed for mild pain.   amiodarone 200 MG tablet Commonly known as: PACERONE Take 1 tablet (200 mg total) by mouth daily. Start taking on: September 26, 2020   apixaban 5 MG Tabs tablet Commonly known as: ELIQUIS Take 1 tablet (5 mg total) by mouth 2 (two) times daily. Start taking on: September 29, 2020   BD Pen Needle Nano 2nd Gen 32G X 4 MM Misc Generic drug: Insulin Pen Needle FOR LANTUS AND VICTOZA PENS   ferrous sulfate 325 (65 FE) MG EC tablet Take 325 mg by mouth daily.   fluticasone 50 MCG/ACT nasal spray Commonly known as: FLONASE Place 2 sprays into both nostrils daily.   gabapentin 300 MG capsule Commonly known as: NEURONTIN Take 1 capsule (300 mg total) by mouth at bedtime. Replaces: gabapentin 600 MG tablet   Lantus SoloStar 100 UNIT/ML Solostar Pen Generic drug: insulin glargine Inject 8 Units into the skin 2 (two) times daily. What changed: how much to take   metFORMIN 500 MG 24 hr tablet Commonly known as: GLUCOPHAGE-XR Take 500 mg by mouth daily with breakfast.   metoprolol tartrate 25 MG tablet Commonly known as: LOPRESSOR Take 0.5 tablets (12.5 mg total) by mouth 2 (two) times daily.   Neupro 8 MG/24HR Pt24 Generic drug: Rotigotine Place 1 patch onto the skin every evening.   ofloxacin 0.3 % ophthalmic solution Commonly known as: OCUFLOX Place 1 drop into both eyes 4 (four) times  daily as needed (eye irritation).   ondansetron 4 MG disintegrating tablet Commonly known as: ZOFRAN-ODT Take 4 mg by mouth every 8 (eight) hours as needed for nausea or vomiting.   Ozempic (0.25 or 0.5 MG/DOSE) 2 MG/1.5ML Sopn Generic drug: Semaglutide(0.25 or 0.5MG /DOS) Inject 0.5 mg into the skin every Tuesday.   polyethylene glycol powder 17 GM/SCOOP powder Commonly known as: GLYCOLAX/MIRALAX Take 17 g by mouth daily.   senna-docusate 8.6-50 MG tablet Commonly known as: Senokot-S Take 1 tablet by mouth 2 (two) times daily as needed for moderate constipation.   tadalafil 5 MG tablet Commonly known as: CIALIS Take 5 mg by mouth daily.   thiamine 100 MG tablet Take 1 tablet (100 mg total) by mouth daily. Start taking on: September 26, 2020  ASK your doctor about these medications    allopurinol 100 MG tablet Commonly known as: Zyloprim Take 2 tablets daily.        Consultations: Cardiology Urology Infectious disease  Procedures/Studies:   CT ABDOMEN PELVIS WO CONTRAST  Result Date: 09/14/2020 CLINICAL DATA:  Acute non localized abdominal pain. EXAM: CT ABDOMEN AND PELVIS WITHOUT CONTRAST TECHNIQUE: Multidetector CT imaging of the abdomen and pelvis was performed following the standard protocol without IV contrast. COMPARISON:  Ultrasound 5 days ago.  CT abdomen 02/19/2020. FINDINGS: Lower chest: Mild atelectasis at the lung bases which obscures the small nodules discussed on the previous exam. No pleural effusion. Hepatobiliary: No liver abnormality seen without contrast. No calcified gallstones. Pancreas: Normal Spleen: Normal Adrenals/Urinary Tract: Adrenal glands are normal. Right kidney shows a few punctate nonobstructing calculi. Renal cysts are present as seen previously, the largest in the lateral midportion measuring 3.7 cm in diameter. There is hydroureteronephrosis with the ureter dilated all the way to the bladder. No obstructing lesion seen at the UVJ.  Left kidney shows a few small cysts as seen previously. Fullness of the renal collecting system in ureter on this side as well all the way to the bladder, without obstructing lesion. The bladder is distended. No obstructing lesion evident within the prostate or proximal penile urethra. Stomach/Bowel: Stomach is normal. Small intestine is normal. Colon is normal. Vascular/Lymphatic: Aortic atherosclerosis. No aneurysm. IVC is normal. No retroperitoneal adenopathy. Reproductive: Negative Other: No free fluid or air. Musculoskeletal: Chronic lower lumbar degenerative changes. IMPRESSION: Distended bladder. This could be neurogenic or due to relative outlet obstruction. Backflow dilatation of the renal collecting systems and ureters without evidence of obstructing lesion. Chronic renal cysts. Two nonobstructing renal calculi on the right. Atelectasis at both lung bases. Cannot evaluate the small peripheral densities described in the left lower lobe on the study last November. Aortic atherosclerosis. Electronically Signed   By: Nelson Chimes M.D.   On: 09/14/2020 19:33   CT HEAD WO CONTRAST  Result Date: 09/16/2020 CLINICAL DATA:  Mental status change EXAM: CT HEAD WITHOUT CONTRAST TECHNIQUE: Contiguous axial images were obtained from the base of the skull through the vertex without intravenous contrast. COMPARISON:  09/10/2020 FINDINGS: Brain: There is no acute intracranial hemorrhage, mass effect, or edema. Gray-white differentiation is preserved. There is no extra-axial fluid collection. Prominence of the ventricles and sulci reflects stable generalized cerebral and cerebellar volume loss. Patchy areas of low-attenuation the supratentorial white matter are nonspecific but probably reflects stable chronic microvascular ischemic changes. There are chronic small vessel infarcts of the basal ganglia bilaterally. Vascular: There is atherosclerotic calcification at the skull base. Skull: Calvarium is unremarkable.  Sinuses/Orbits: No acute finding. Other: None. IMPRESSION: No acute intracranial abnormality or significant change since recent prior study. Electronically Signed   By: Macy Mis M.D.   On: 09/16/2020 15:18   CT HEAD WO CONTRAST  Result Date: 09/10/2020 CLINICAL DATA:  Delirium, increased altered mental status EXAM: CT HEAD WITHOUT CONTRAST TECHNIQUE: Contiguous axial images were obtained from the base of the skull through the vertex without intravenous contrast. Sagittal and coronal MPR images reconstructed from axial data set. COMPARISON:  None FINDINGS: Brain: Generalized atrophy. Normal ventricular morphology. No midline shift or mass effect. Small vessel chronic ischemic changes of deep cerebral white matter. Old lacunar infarcts at basal ganglia bilaterally. No intracranial hemorrhage, mass lesion, or evidence of acute infarction. No extra-axial fluid collections. Vascular: No hyperdense vessels. Atherosclerotic calcification of internal carotid and vertebral arteries at  skull base noted. Skull: Intact Sinuses/Orbits: Small amount of fluid within sphenoid sinus. Paranasal sinuses and mastoid air cells otherwise clear. Other: N/A IMPRESSION: Atrophy with small vessel chronic ischemic changes of deep cerebral white matter. Old lacunar infarcts at basal ganglia bilaterally. No acute intracranial abnormalities. Small amount of nonspecific fluid within sphenoid spinous. Electronically Signed   By: Lavonia Dana M.D.   On: 09/10/2020 15:32   MR BRAIN WO CONTRAST  Result Date: 09/19/2020 CLINICAL DATA:  Acute neuro deficit EXAM: MRI HEAD WITHOUT CONTRAST TECHNIQUE: Multiplanar, multiecho pulse sequences of the brain and surrounding structures were obtained without intravenous contrast. COMPARISON:  CT head 09/16/2020 FINDINGS: Brain: Generalized atrophy without hydrocephalus. Hyperintensity and volume loss in the periventricular white matter right greater than left due to chronic ischemia. Chronic basal  ganglia infarcts bilaterally. Mild chronic ischemic change in the pons. Negative for acute infarct, hemorrhage, mass negative for hydrocephalus Vascular: Normal arterial flow voids. Skull and upper cervical spine: Negative Sinuses/Orbits: Air-fluid level in the sphenoid sinus. Remaining paranasal sinuses clear. Negative orbit Other: None IMPRESSION: No acute abnormality. Atrophy and moderate chronic microvascular ischemic changes. Electronically Signed   By: Franchot Gallo M.D.   On: 09/19/2020 10:03   MR CERVICAL SPINE WO CONTRAST  Result Date: 09/19/2020 CLINICAL DATA:  Myelopathy. EXAM: MRI CERVICAL SPINE WITHOUT CONTRAST TECHNIQUE: Multiplanar, multisequence MR imaging of the cervical spine was performed. No intravenous contrast was administered. COMPARISON:  None. FINDINGS: Alignment: Mild retrolisthesis C4-5.  Otherwise normal alignment Vertebrae: Negative for fracture or mass. Mild bone marrow edema on the left involving the superior endplate of C5 likely hemangioma. This is intermediate signal on T1. No priors for comparison. Small area of fatty marrow on the left C4. Possible hemangioma. Cord: Cord evaluation limited by motion. No cord signal abnormality identified. Posterior Fossa, vertebral arteries, paraspinal tissues: Negative Disc levels: C2-3: Negative C3-4: Moderate disc degeneration with disc space narrowing and diffuse uncinate spurring. Mild spinal stenosis. Moderate foraminal stenosis bilaterally due to spurring C4-5: Moderate disc degeneration with disc space narrowing and diffuse uncinate spurring. Mild spinal stenosis. Moderate foraminal stenosis bilaterally due to spurring. C6-7: Disc degeneration with diffuse uncinate spurring. Mild foraminal narrowing bilaterally C6-7: Disc degeneration with diffuse uncinate spurring. Mild foraminal narrowing bilaterally. C7-T1: Negative for stenosis. IMPRESSION: Cervical spondylosis. Spinal and foraminal stenosis most prominent C3-4 and C4-5. No acute  abnormality. Electronically Signed   By: Franchot Gallo M.D.   On: 09/19/2020 10:08   NM Pulmonary Perfusion  Result Date: 09/09/2020 CLINICAL DATA:  Elevated D-dimer, rule out PE EXAM: NUCLEAR MEDICINE PERFUSION LUNG SCAN TECHNIQUE: Perfusion images were obtained in multiple projections after intravenous injection of radiopharmaceutical. Ventilation scans intentionally deferred if perfusion scan and chest x-ray adequate for interpretation during COVID 19 epidemic. RADIOPHARMACEUTICALS:  4.1 mCi Tc-52m MAA IV COMPARISON:  Chest radiograph, 09/08/2020 FINDINGS: Normal, homogeneous perfusion of the lungs bilaterally. No suspicious perfusion defects. IMPRESSION: Very low probability examination for pulmonary embolism by modified perfusion only PIOPED criteria (PE absent). Electronically Signed   By: Eddie Candle M.D.   On: 09/09/2020 17:18   DG CHEST PORT 1 VIEW  Result Date: 09/16/2020 CLINICAL DATA:  Hypoxia EXAM: PORTABLE CHEST 1 VIEW COMPARISON:  Prior chest x-ray yesterday 09/15/2020 FINDINGS: Patient is markedly rotated toward the right. This results in distortion of the cardiac and mediastinal contours. Grossly, the heart and mediastinum appear unchanged. Atherosclerotic calcifications again noted in the transverse aorta. Progressive elevation of the right hemidiaphragm with increasing linear perihilar and basilar opacities  most consistent with atelectasis. The aerated portions of the lungs are otherwise clear. No significant pulmonary vascular congestion or evidence of edema. No large effusion or pneumothorax. Left greater than right humeral joint osteoarthritis. IMPRESSION: 1. Patient is significantly rotated toward the right. 2. Increasing right perihilar and basilar atelectasis with progressive elevation of the right hemidiaphragm. 3. Otherwise, no new acute cardiopulmonary process. Electronically Signed   By: Jacqulynn Cadet M.D.   On: 09/16/2020 10:01   DG CHEST PORT 1 VIEW  Result Date:  09/15/2020 CLINICAL DATA:  Tachypnea EXAM: PORTABLE CHEST 1 VIEW COMPARISON:  09/10/2020 FINDINGS: Low lung volumes, right base atelectasis. Heart is normal size. Aortic atherosclerosis. No effusions or pneumothorax. No acute bony abnormality. IMPRESSION: Low lung volumes.  Right base atelectasis. Electronically Signed   By: Rolm Baptise M.D.   On: 09/15/2020 22:06   DG Chest Port 1 View  Result Date: 09/10/2020 CLINICAL DATA:  Increased shortness of breath EXAM: PORTABLE CHEST 1 VIEW COMPARISON:  Portable exam 0932 hours compared to 09/08/2020 FINDINGS: Enlargement of cardiac silhouette. Atherosclerotic calcification aorta. Mediastinal contours and pulmonary vascularity normal. Decreased lung volumes with bibasilar atelectasis. Upper lungs clear. No pleural effusion or pneumothorax. Advanced LEFT glenohumeral degenerative changes with calcified loose bodies. IMPRESSION: Decreased lung volumes with bibasilar atelectasis. Enlargement of cardiac silhouette. Aortic Atherosclerosis (ICD10-I70.0). Electronically Signed   By: Lavonia Dana M.D.   On: 09/10/2020 10:13   DG Chest Portable 1 View  Result Date: 09/08/2020 CLINICAL DATA:  Anxiety with nausea and cough. EXAM: PORTABLE CHEST 1 VIEW COMPARISON:  April 26, 2019 FINDINGS: Low lung volumes are seen which is likely secondary to the degree of patient inspiration. Mild, stable linear scarring and/or atelectasis is seen within the right lung base. There is no evidence of a pleural effusion or pneumothorax. The heart size and mediastinal contours are within normal limits. Mild to moderate severity calcification of the thoracic aorta is seen. Degenerative changes seen involving the left shoulder. IMPRESSION: Mild, stable right basilar linear scarring and/or atelectasis. Electronically Signed   By: Virgina Norfolk M.D.   On: 09/08/2020 23:46   DG Foot 2 Views Left  Result Date: 09/10/2020 CLINICAL DATA:  LEFT foot bruising. EXAM: LEFT FOOT - 2 VIEW COMPARISON:   None. FINDINGS: Osseous alignment is normal. No fracture line or displaced fracture fragment is seen. No degenerative change. Adjacent soft tissues are unremarkable. IMPRESSION: Negative. Electronically Signed   By: Franki Cabot M.D.   On: 09/10/2020 16:44   DG Toe 2nd Left  Result Date: 09/11/2020 CLINICAL DATA:  Pain and bruising. EXAM: LEFT SECOND TOE COMPARISON:  Foot radiograph 652 FINDINGS: There is no evidence of fracture or dislocation. Mild degenerative change of the proximal interphalangeal joint with spurring. No erosion or bone destruction. Soft tissues are unremarkable. IMPRESSION: Mild degenerative change without acute findings of the second toe. Electronically Signed   By: Keith Rake M.D.   On: 09/11/2020 21:59   DG Toe 3rd Left  Result Date: 09/11/2020 CLINICAL DATA:  Pain and bruising. EXAM: LEFT THIRD TOE COMPARISON:  Foot radiograph 09/10/2020 FINDINGS: Lateral view limited by positioning and osseous overlap. There is no evidence of fracture or dislocation. There is no evidence of arthropathy or other focal bone abnormality. Soft tissues are unremarkable. IMPRESSION: Negative radiographs of the left third toe. Electronically Signed   By: Keith Rake M.D.   On: 09/11/2020 21:58   EEG adult  Result Date: 09/11/2020 Lora Havens, MD     09/11/2020  1:27 PM Patient Name: Jason Hartman MRN: [id number] Epilepsy Attending: Lora Havens Referring Physician/Provider: Dr Margaretha Seeds Date: 09/11/2020 Duration: 1 hour 8 mins Patient history: 85 year old male with altered mental status.  EEG to evaluate for seizures. Level of alertness: Awake AEDs during EEG study: Valium Technical aspects: This EEG study was done with ceribell EEG system. Electrical activity was reviewed with a high frequency filter of 70Hz  and a low frequency filter of 1Hz . EEG data were recorded continuously and digitally stored. Description: The posterior dominant rhythm consists of 9 Hz activity of moderate  voltage (25-35 uV) seen predominantly in posterior head regions, symmetric and reactive to eye opening and eye closing. EEG showed cintermittent generalized 3 to 6 Hz theta-delta slowing. Hyperventilation and photic stimulation were not performed.   ABNORMALITY - Intermittent slow, generalized IMPRESSION: This study is suggestive of mild diffuse encephalopathy, nonspecific etiology. No seizures or epileptiform discharges were seen throughout the recording. Lora Havens   ECHOCARDIOGRAM COMPLETE  Result Date: 09/09/2020    ECHOCARDIOGRAM REPORT   Patient Name:   Jason Hartman Date of Exam: 09/09/2020 Medical Rec #:  [id number]     Height:       64.0 in Accession #:    [id number]    Weight:       178.0 lb Date of Birth:  03/30/1936    BSA:          1.862 m Patient Age:    26 years      BP:           155/91 mmHg Patient Gender: M             HR:           104 bpm. Exam Location:  Inpatient Procedure: 2D Echo, Cardiac Doppler and Color Doppler Indications:    Other abnormalities of the heart R00.8  History:        Patient has prior history of Echocardiogram examinations, most                 recent 04/27/2019. Risk Factors:Hypertension. Patient has been                 sweating profusely for four months without fever per patient.  Sonographer:    Merrie Roof RDCS Referring Phys: [place]  1. Left ventricular ejection fraction, by estimation, is 55 to 60%. The left ventricle has normal function. The left ventricle has no regional wall motion abnormalities. There is mild left ventricular hypertrophy. Left ventricular diastolic parameters are consistent with Grade I diastolic dysfunction (impaired relaxation).  2. Right ventricular systolic function is normal. The right ventricular size is normal. Tricuspid regurgitation signal is inadequate for assessing PA pressure.  3. Left atrial size was mildly dilated.  4. The mitral valve is grossly normal. Trivial mitral valve regurgitation.  5.  The aortic valve is tricuspid. There is mild calcification of the aortic valve. Aortic valve regurgitation is trivial. Mild aortic valve sclerosis is present, with no evidence of aortic valve stenosis. FINDINGS  Left Ventricle: Left ventricular ejection fraction, by estimation, is 55 to 60%. The left ventricle has normal function. The left ventricle has no regional wall motion abnormalities. The left ventricular internal cavity size was normal in size. There is  mild left ventricular hypertrophy. Left ventricular diastolic parameters are consistent with Grade I diastolic dysfunction (impaired relaxation). Right Ventricle: The right ventricular size is normal. No increase in right ventricular wall  thickness. Right ventricular systolic function is normal. Tricuspid regurgitation signal is inadequate for assessing PA pressure. Left Atrium: Left atrial size was mildly dilated. Right Atrium: Right atrial size was normal in size. Pericardium: There is no evidence of pericardial effusion. Mitral Valve: The mitral valve is grossly normal. Mild mitral annular calcification. Trivial mitral valve regurgitation. Tricuspid Valve: The tricuspid valve is grossly normal. Tricuspid valve regurgitation is trivial. Aortic Valve: The aortic valve is tricuspid. There is mild calcification of the aortic valve. There is mild aortic valve annular calcification. Aortic valve regurgitation is trivial. Mild aortic valve sclerosis is present, with no evidence of aortic valve stenosis. Pulmonic Valve: The pulmonic valve was grossly normal. Pulmonic valve regurgitation is trivial. Aorta: The aortic root is normal in size and structure. IAS/Shunts: The interatrial septum appears to be lipomatous. No atrial level shunt detected by color flow Doppler.  LEFT VENTRICLE PLAX 2D LVIDd:         3.30 cm  Diastology LVIDs:         2.80 cm  LV e' medial:    5.44 cm/s LV PW:         1.30 cm  LV E/e' medial:  13.2 LV IVS:        1.10 cm  LV e' lateral:    8.05 cm/s LVOT diam:     2.00 cm  LV E/e' lateral: 8.9 LV SV:         46 LV SV Index:   25 LVOT Area:     3.14 cm  LEFT ATRIUM             Index LA diam:        4.00 cm 2.15 cm/m LA Vol (A2C):   59.8 ml 32.12 ml/m LA Vol (A4C):   65.6 ml 35.23 ml/m LA Biplane Vol: 65.1 ml 34.97 ml/m  AORTIC VALVE LVOT Vmax:   102.00 cm/s LVOT Vmean:  73.500 cm/s LVOT VTI:    0.146 m  AORTA Ao Root diam: 3.60 cm MITRAL VALVE MV Area (PHT): 3.53 cm     SHUNTS MV Decel Time: 215 msec     Systemic VTI:  0.15 m MV E velocity: 72.00 cm/s   Systemic Diam: 2.00 cm MV A velocity: 116.00 cm/s MV E/A ratio:  0.62 Rozann Lesches MD Electronically signed by Rozann Lesches MD Signature Date/Time: 09/09/2020/1:20:33 PM    Final    VAS [place] LOWER EXTREMITY VENOUS (DVT)  Result Date: 09/10/2020  Lower Venous DVT Study Patient Name:  Jason Hartman  Date of Exam:   09/09/2020 Medical Rec #: [id number]      Accession #:    [id number] Date of Birth: 1935-06-27     Patient Gender: M Patient Age:   84Y Exam Location:  [facility] Procedure:      VAS [place] LOWER EXTREMITY VENOUS (DVT) Referring Phys: [id number] A CALDWELL POWELL JR --------------------------------------------------------------------------------  Indications: Elevated Ddimer.  Risk Factors: None identified. Limitations: Poor ultrasound/tissue interface. Comparison Study: No prior studies. Performing Technologist: Oliver Hum RVT  Examination Guidelines: A complete evaluation includes B-mode imaging, spectral Doppler, color Doppler, and power Doppler as needed of all accessible portions of each vessel. Bilateral testing is considered an integral part of a complete examination. Limited examinations for reoccurring indications may be performed as noted. The reflux portion of the exam is performed with the patient in reverse Trendelenburg.  +---------+---------------+---------+-----------+----------+--------------+ RIGHT    CompressibilityPhasicitySpontaneityPropertiesThrombus  Aging +---------+---------------+---------+-----------+----------+--------------+ CFV      Full  Yes      Yes                                 +---------+---------------+---------+-----------+----------+--------------+ SFJ      Full                                                        +---------+---------------+---------+-----------+----------+--------------+ FV Prox  Full                                                        +---------+---------------+---------+-----------+----------+--------------+ FV Mid   Full                                                        +---------+---------------+---------+-----------+----------+--------------+ FV DistalFull                                                        +---------+---------------+---------+-----------+----------+--------------+ PFV      Full                                                        +---------+---------------+---------+-----------+----------+--------------+ POP      Full           Yes      Yes                                 +---------+---------------+---------+-----------+----------+--------------+ PTV      Full                                                        +---------+---------------+---------+-----------+----------+--------------+ PERO     Full                                                        +---------+---------------+---------+-----------+----------+--------------+   +---------+---------------+---------+-----------+----------+--------------+ LEFT     CompressibilityPhasicitySpontaneityPropertiesThrombus Aging +---------+---------------+---------+-----------+----------+--------------+ CFV      Full           Yes      Yes                                 +---------+---------------+---------+-----------+----------+--------------+ SFJ  Full                                                         +---------+---------------+---------+-----------+----------+--------------+ FV Prox  Full                                                        +---------+---------------+---------+-----------+----------+--------------+ FV Mid   Full                                                        +---------+---------------+---------+-----------+----------+--------------+ FV DistalFull                                                        +---------+---------------+---------+-----------+----------+--------------+ PFV      Full                                                        +---------+---------------+---------+-----------+----------+--------------+ POP      Full           Yes      Yes                                 +---------+---------------+---------+-----------+----------+--------------+ PTV      Full                                                        +---------+---------------+---------+-----------+----------+--------------+ PERO     Full                                                        +---------+---------------+---------+-----------+----------+--------------+     Summary: RIGHT: - There is no evidence of deep vein thrombosis in the lower extremity.  - No cystic structure found in the popliteal fossa.  LEFT: - There is no evidence of deep vein thrombosis in the lower extremity.  - No cystic structure found in the popliteal fossa.  *See table(s) above for measurements and observations. Electronically signed by Ruta Hinds MD on 09/10/2020 at 5:10:11 PM.    Final    [place] EKG SITE RITE  Result Date: 09/10/2020 If Site Rite image not attached, placement could not be confirmed due to current cardiac rhythm.  US Abdomen Limited RUQ (LIVER/GB)  Result Date: 09/09/2020 CLINICAL DATA:  Increased  bilirubin. EXAM: ULTRASOUND ABDOMEN LIMITED RIGHT UPPER QUADRANT COMPARISON:  CT 02/19/2020 FINDINGS: Gallbladder: No gallstones or wall thickening visualized. No  sonographic Murphy sign noted by sonographer. Common bile duct: Diameter: Normal at 2 mm Liver: No focal lesion identified. Within normal limits in parenchymal echogenicity. Portal vein is patent on color Doppler imaging with normal direction of blood flow towards the liver. Other: None. IMPRESSION: 1. Normal gallbladder and common bile duct. 2. No biliary duct dilatation. Electronically Signed   By: Suzy Bouchard M.D.   On: 09/09/2020 15:56       The results of significant diagnostics from this hospitalization (including imaging, microbiology, ancillary and laboratory) are listed below for reference.     Microbiology: Recent Results (from the past 240 hour(s))  SARS CORONAVIRUS 2 (TAT 6-24 HRS) Nasopharyngeal Nasopharyngeal Swab     Status: None   Collection Time: 09/24/20  4:40 PM   Specimen: Nasopharyngeal Swab  Result Value Ref Range Status   SARS Coronavirus 2 NEGATIVE NEGATIVE Final    Comment: (NOTE) SARS-CoV-2 target nucleic acids are NOT DETECTED.  The SARS-CoV-2 RNA is generally detectable in upper and lower respiratory specimens during the acute phase of infection. Negative results do not preclude SARS-CoV-2 infection, do not rule out co-infections with other pathogens, and should not be used as the sole basis for treatment or other patient management decisions. Negative results must be combined with clinical observations, patient history, and epidemiological information. The expected result is Negative.  Fact Sheet for Patients: SugarRoll.be  Fact Sheet for Healthcare Providers: https://www.woods-mathews.com/  This test is not yet approved or cleared by the [place] FDA and  has been authorized for detection and/or diagnosis of SARS-CoV-2 by FDA under an Emergency Use Authorization (EUA). This EUA will remain  in effect (meaning this test can be used) for the duration of the COVID-19 declaration under Se ction 564(b)(1)  of the Act, 21 U.S.C. section 360bbb-3(b)(1), unless the authorization is terminated or revoked sooner.  Performed at [facility], [place] [address] [place], [place] [address]      Labs:  CBC: Recent Labs  Lab 09/19/20 0503 09/22/20 1417  WBC 8.5 8.4  HGB 15.0 14.2  HCT 49.1 45.4  MCV 94.1 91.0  PLT 154 185   BMP &GFR Recent Labs  Lab 09/19/20 0503 09/20/20 0926  NA 139 141  K 3.9 3.8  CL 106 107  CO2 26 29  GLUCOSE 117* 159*  BUN 47* 25*  CREATININE 1.00 1.07  CALCIUM 8.2* 8.5*   Estimated Creatinine Clearance: 50.7 mL/min (by C-G formula based on SCr of 1.07 mg/dL). Liver & Pancreas: No results for input(s): AST, ALT, ALKPHOS, BILITOT, PROT, ALBUMIN in the last 168 hours. No results for input(s): LIPASE, AMYLASE in the last 168 hours. No results for input(s): AMMONIA in the last 168 hours. Diabetic: No results for input(s): HGBA1C in the last 72 hours. Recent Labs  Lab 09/24/20 0814 09/24/20 1202 09/24/20 1644 09/24/20 2138 09/25/20 0744  GLUCAP 125* 154* 152* 180* 212*   Cardiac Enzymes: No results for input(s): CKTOTAL, CKMB, CKMBINDEX, TROPONINI in the last 168 hours. No results for input(s): PROBNP in the last 8760 hours. Coagulation Profile: Recent Labs  Lab 09/22/20 1417  INR 1.2   Thyroid Function Tests: No results for input(s): TSH, T4TOTAL, FREET4, T3FREE, THYROIDAB in the last 72 hours. Lipid Profile: No results for input(s): CHOL, HDL, LDLCALC, TRIG, CHOLHDL, LDLDIRECT in the last 72 hours. Anemia Panel: No results for input(s):  VITAMINB12, FOLATE, FERRITIN, TIBC, IRON, RETICCTPCT in the last 72 hours. Urine analysis:    Component Value Date/Time   COLORURINE YELLOW 09/12/2020 2253   APPEARANCEUR HAZY (A) 09/12/2020 2253   LABSPEC 1.005 09/12/2020 2253   PHURINE 6.0 09/12/2020 2253   GLUCOSEU >=500 (A) 09/12/2020 2253   HGBUR LARGE (A) 09/12/2020 2253   BILIRUBINUR NEGATIVE 09/12/2020 2253   KETONESUR NEGATIVE 09/12/2020  2253   PROTEINUR NEGATIVE 09/12/2020 2253   UROBILINOGEN 0.2 12/04/2013 0210   NITRITE NEGATIVE 09/12/2020 2253   LEUKOCYTESUR LARGE (A) 09/12/2020 2253   Sepsis Labs: Invalid input(s): PROCALCITONIN, LACTICIDVEN   Time coordinating discharge: 45 minutes  SIGNED:  Mercy Riding, MD  [facility] 09/25/2020, 10:17 AM  If 7PM-7AM, please contact night-coverage [email or web address]

## 2020-09-26 DIAGNOSIS — G894 Chronic pain syndrome: Secondary | ICD-10-CM | POA: Diagnosis not present

## 2020-09-26 DIAGNOSIS — M25512 Pain in left shoulder: Secondary | ICD-10-CM | POA: Diagnosis not present

## 2020-09-26 DIAGNOSIS — M6281 Muscle weakness (generalized): Secondary | ICD-10-CM | POA: Diagnosis not present

## 2020-09-26 DIAGNOSIS — R262 Difficulty in walking, not elsewhere classified: Secondary | ICD-10-CM | POA: Diagnosis not present

## 2020-09-27 DIAGNOSIS — E860 Dehydration: Secondary | ICD-10-CM | POA: Diagnosis not present

## 2020-09-27 DIAGNOSIS — E1140 Type 2 diabetes mellitus with diabetic neuropathy, unspecified: Secondary | ICD-10-CM | POA: Diagnosis not present

## 2020-09-27 DIAGNOSIS — R4189 Other symptoms and signs involving cognitive functions and awareness: Secondary | ICD-10-CM | POA: Diagnosis not present

## 2020-09-27 DIAGNOSIS — E785 Hyperlipidemia, unspecified: Secondary | ICD-10-CM | POA: Diagnosis not present

## 2020-09-27 DIAGNOSIS — F411 Generalized anxiety disorder: Secondary | ICD-10-CM | POA: Diagnosis not present

## 2020-09-27 DIAGNOSIS — R1312 Dysphagia, oropharyngeal phase: Secondary | ICD-10-CM | POA: Diagnosis not present

## 2020-09-27 DIAGNOSIS — E43 Unspecified severe protein-calorie malnutrition: Secondary | ICD-10-CM | POA: Diagnosis not present

## 2020-09-29 ENCOUNTER — Emergency Department (HOSPITAL_COMMUNITY)
Admission: EM | Admit: 2020-09-29 | Discharge: 2020-09-29 | Disposition: A | Discharge status: Home / Self Care | Payer: Medicare Other | Attending: Emergency Medicine | Admitting: Emergency Medicine

## 2020-09-29 ENCOUNTER — Emergency Department (HOSPITAL_COMMUNITY): Payer: Medicare Other

## 2020-09-29 ENCOUNTER — Other Ambulatory Visit: Payer: Self-pay

## 2020-09-29 ENCOUNTER — Encounter (HOSPITAL_COMMUNITY): Payer: Self-pay | Admitting: Emergency Medicine

## 2020-09-29 DIAGNOSIS — E1122 Type 2 diabetes mellitus with diabetic chronic kidney disease: Secondary | ICD-10-CM | POA: Insufficient documentation

## 2020-09-29 DIAGNOSIS — W19XXXA Unspecified fall, initial encounter: Secondary | ICD-10-CM

## 2020-09-29 DIAGNOSIS — I480 Paroxysmal atrial fibrillation: Secondary | ICD-10-CM | POA: Diagnosis not present

## 2020-09-29 DIAGNOSIS — R410 Disorientation, unspecified: Secondary | ICD-10-CM | POA: Diagnosis not present

## 2020-09-29 DIAGNOSIS — W06XXXA Fall from bed, initial encounter: Secondary | ICD-10-CM | POA: Insufficient documentation

## 2020-09-29 DIAGNOSIS — E1142 Type 2 diabetes mellitus with diabetic polyneuropathy: Secondary | ICD-10-CM | POA: Insufficient documentation

## 2020-09-29 DIAGNOSIS — Z79899 Other long term (current) drug therapy: Secondary | ICD-10-CM | POA: Diagnosis not present

## 2020-09-29 DIAGNOSIS — Z7984 Long term (current) use of oral hypoglycemic drugs: Secondary | ICD-10-CM | POA: Diagnosis not present

## 2020-09-29 DIAGNOSIS — I129 Hypertensive chronic kidney disease with stage 1 through stage 4 chronic kidney disease, or unspecified chronic kidney disease: Secondary | ICD-10-CM | POA: Diagnosis not present

## 2020-09-29 DIAGNOSIS — M542 Cervicalgia: Secondary | ICD-10-CM | POA: Insufficient documentation

## 2020-09-29 DIAGNOSIS — Z794 Long term (current) use of insulin: Secondary | ICD-10-CM | POA: Diagnosis not present

## 2020-09-29 DIAGNOSIS — Z8554 Personal history of malignant neoplasm of ureter: Secondary | ICD-10-CM | POA: Insufficient documentation

## 2020-09-29 DIAGNOSIS — Z7901 Long term (current) use of anticoagulants: Secondary | ICD-10-CM | POA: Insufficient documentation

## 2020-09-29 DIAGNOSIS — F411 Generalized anxiety disorder: Secondary | ICD-10-CM | POA: Diagnosis not present

## 2020-09-29 DIAGNOSIS — N1831 Chronic kidney disease, stage 3a: Secondary | ICD-10-CM | POA: Insufficient documentation

## 2020-09-29 DIAGNOSIS — Z043 Encounter for examination and observation following other accident: Secondary | ICD-10-CM | POA: Diagnosis not present

## 2020-09-29 DIAGNOSIS — S0990XA Unspecified injury of head, initial encounter: Secondary | ICD-10-CM | POA: Diagnosis present

## 2020-09-29 DIAGNOSIS — S3992XA Unspecified injury of lower back, initial encounter: Secondary | ICD-10-CM | POA: Diagnosis present

## 2020-09-29 DIAGNOSIS — Z87891 Personal history of nicotine dependence: Secondary | ICD-10-CM | POA: Diagnosis not present

## 2020-09-29 DIAGNOSIS — M545 Low back pain, unspecified: Secondary | ICD-10-CM | POA: Diagnosis not present

## 2020-09-29 DIAGNOSIS — I2510 Atherosclerotic heart disease of native coronary artery without angina pectoris: Secondary | ICD-10-CM | POA: Diagnosis not present

## 2020-09-29 DIAGNOSIS — S300XXA Contusion of lower back and pelvis, initial encounter: Secondary | ICD-10-CM | POA: Insufficient documentation

## 2020-09-29 IMAGING — CT CT CERVICAL SPINE W/O CM
3 of 4 series · 12 of 33 positions shown, 14 images · non-contrast
Comparison: MRI C-spine [DATE], CT head [DATE]

CLINICAL DATA: Fall, chronic anticoagulation

EXAM:
CT HEAD WITHOUT CONTRAST
CT CERVICAL SPINE WITHOUT CONTRAST
TECHNIQUE: Multidetector CT imaging of the head and cervical spine was
performed following the standard protocol without intravenous
contrast. Multiplanar CT image reconstructions of the cervical spine
were also generated.

[Series 3: sagittal bone · sagittal · 0.23mm/px · 5 of 61 slices shown, 6 images]
[im 21/61  bone]
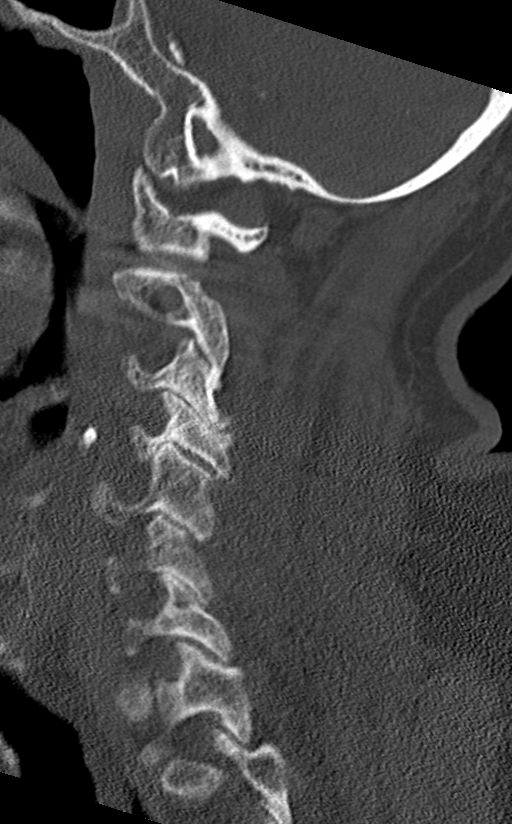
[im 26/61  bone]
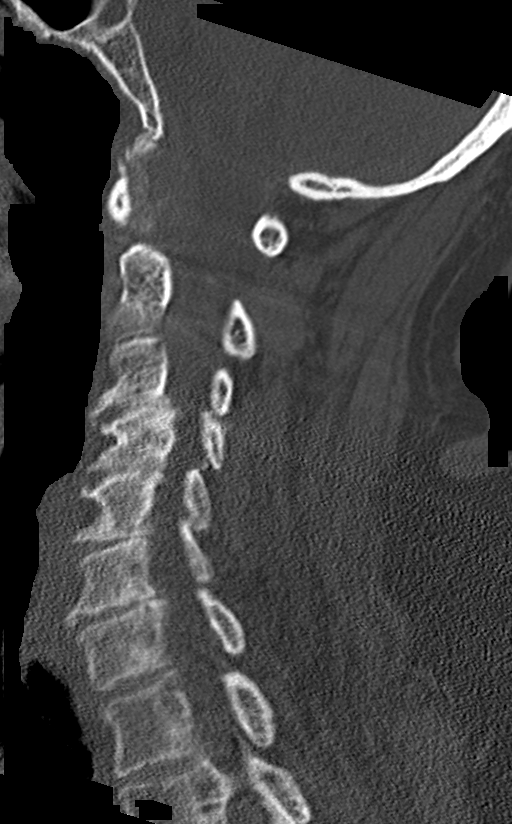
[im 31/61  soft-tissue]
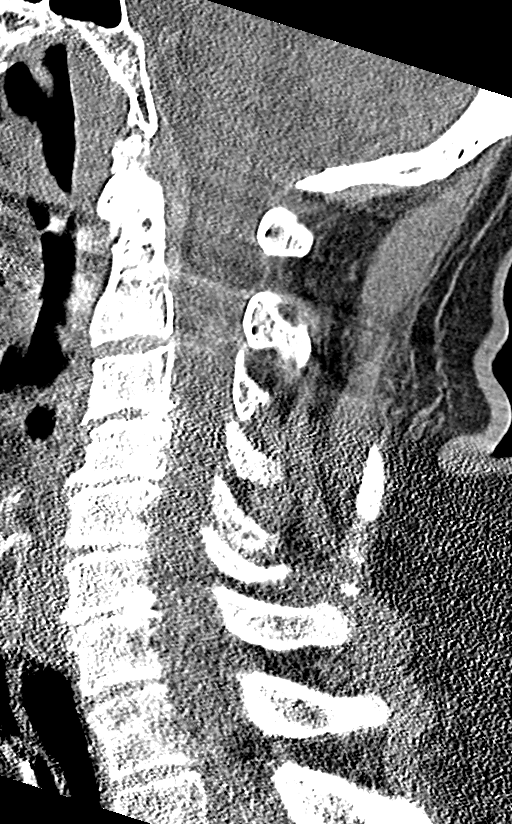
[im 31/61  bone]
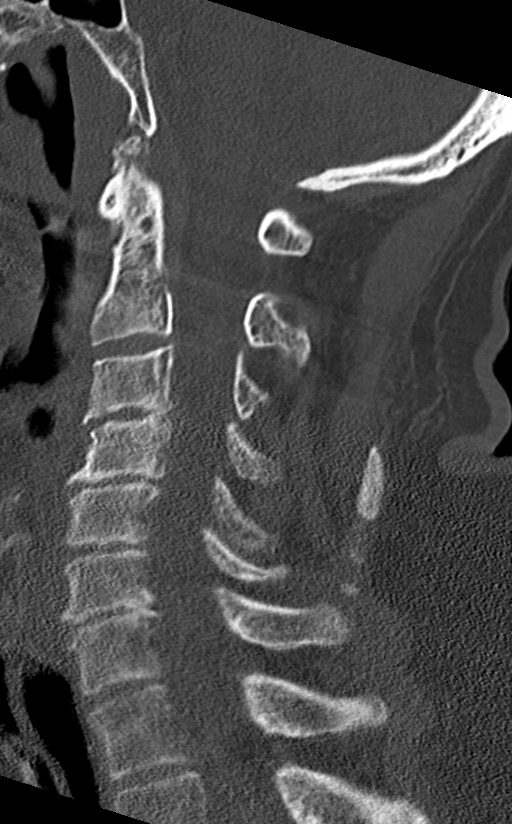
[im 36/61  bone]
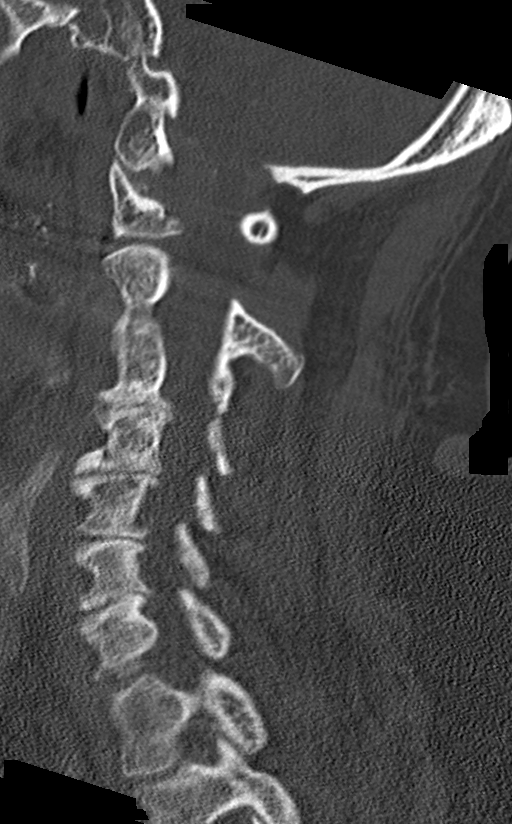
[im 41/61  bone]
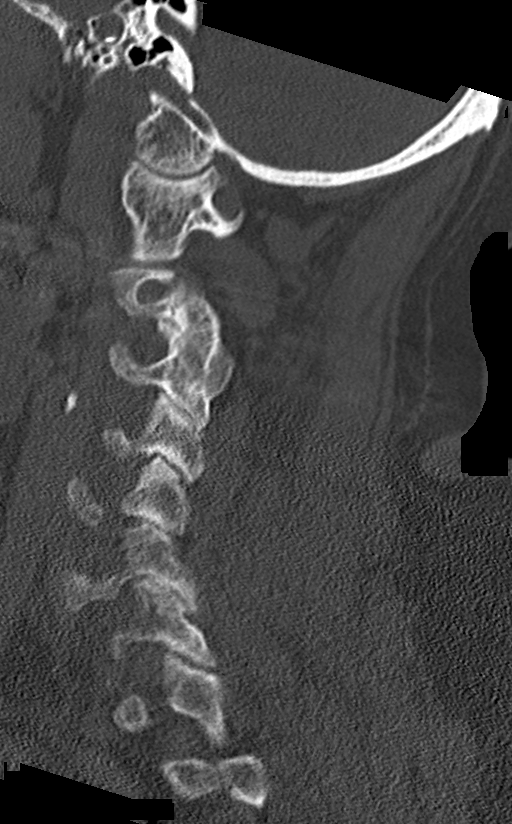

[Series 6: orthogonal bone · axial · 0.23mm/px · z∈[-272,-152]mm · 4 of 96 slices shown, 5 images]
[im 16/96  soft-tissue]
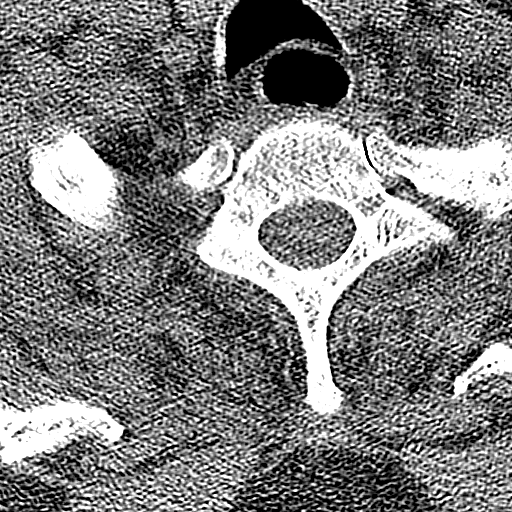
[im 16/96  bone]
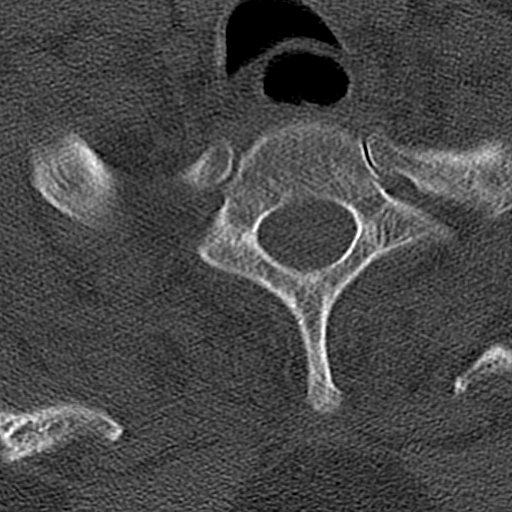
[im 32/96  bone]
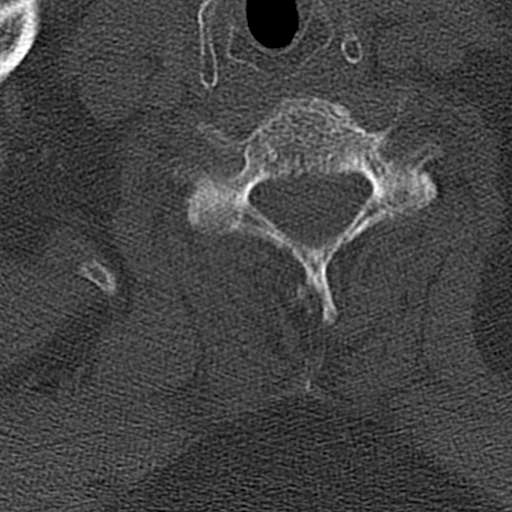
[im 64/96  bone]
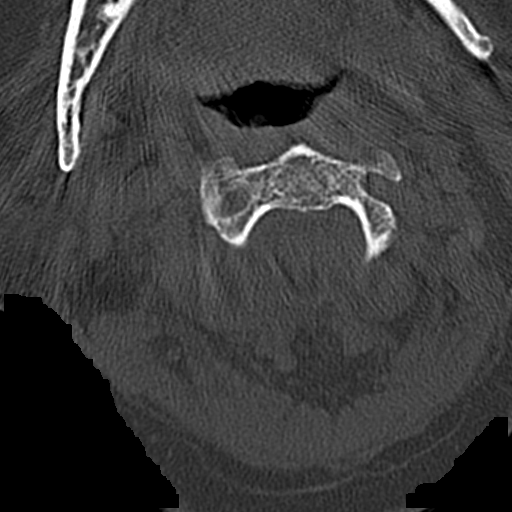
[im 80/96  bone]
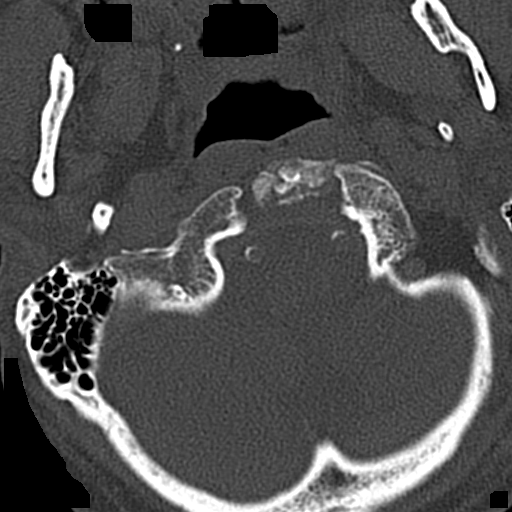

[Series 7: coronal bone · coronal · 0.27mm/px · 3 of 55 slices shown]
[im 11/55  bone]
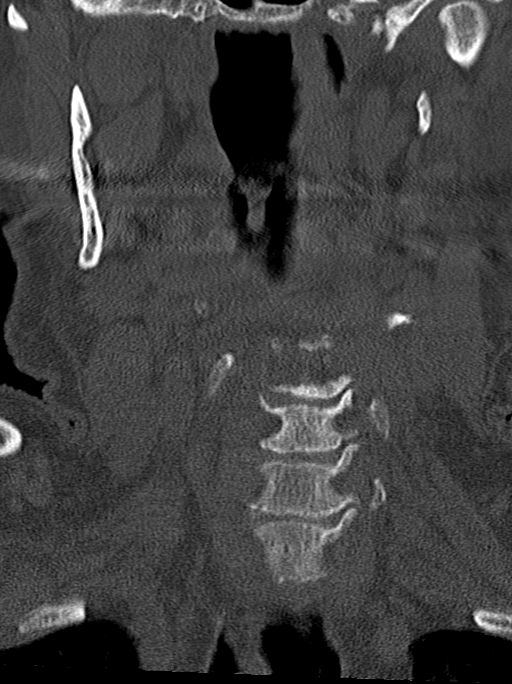
[im 22/55  bone]
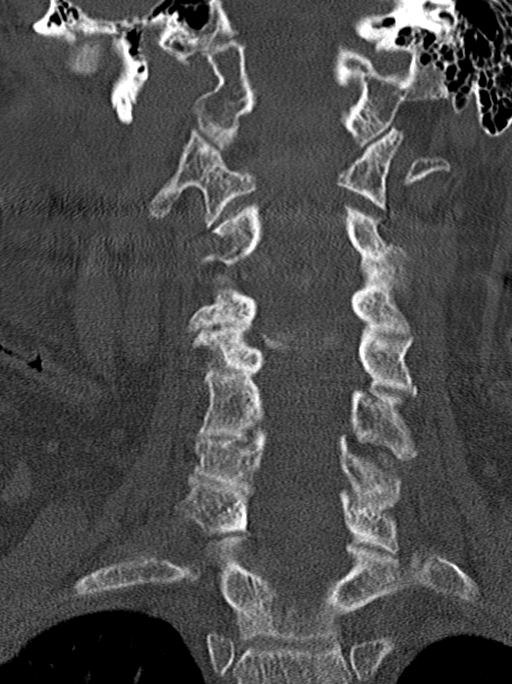
[im 33/55  bone]
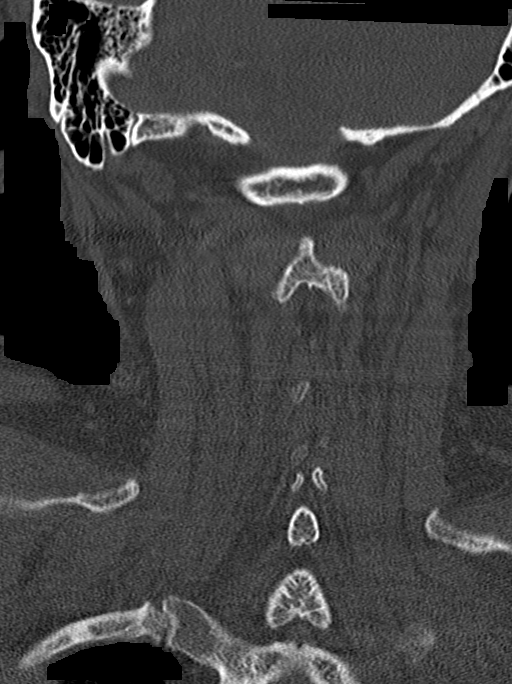

[12 of 33 positions shown; findings below may reference images not displayed]

FINDINGS: CT HEAD FINDINGS

Brain: Normal anatomic configuration. Parenchymal volume loss is
commensurate with the patient's age. Moderate periventricular white
matter changes are present likely reflecting the sequela of small
vessel ischemia. Remote lacunar infarcts are noted within the
lentiform nuclei bilaterally as well as the right caudate nucleus.
No abnormal intra or extra-axial mass lesion or fluid collection. No
abnormal mass effect or midline shift. No evidence of acute
intracranial hemorrhage or infarct. Ventricular size is normal.
Cerebellum unremarkable.

Vascular: No asymmetric hyperdense vasculature at the skull base.
Advanced vascular calcifications are noted within the carotid
siphons.

Skull: Intact

Sinuses/Orbits: Paranasal sinuses are clear. Orbits are
unremarkable.

Other: Mastoid air cells and middle ear cavities are clear.

CT CERVICAL SPINE FINDINGS

Alignment: Stable mild retrolisthesis at C4-5.

Skull base and vertebrae: Craniocervical alignment is normal. The
atlantodental interval is not widened. No acute fracture of the
cervical spine. Ankylosis of the left C2-3 facet joint.

Soft tissues and spinal canal: Moderate central canal stenosis with
mild flattening of the thecal sac at C4-5 and C5-6. No canal
hematoma. The prevertebral soft tissues are not thickened. No
paraspinal fluid collections.

Disc levels: There is diffuse intervertebral disc space narrowing
and endplate remodeling throughout the cervical spine in keeping
with changes of moderate to severe degenerative disc disease.
Vertebral body height has been preserved. The prevertebral soft
tissues are not thickened on sagittal reformats. Review of the axial
images demonstrates multilevel uncovertebral and facet arthrosis
resulting in multilevel mild-to-moderate neuroforaminal narrowing,
most severe on the left at C5-6,

Upper chest: Unremarkable

Other: None
IMPRESSION: No acute intracranial abnormality.  No calvarial fracture.

No acute fracture or listhesis of the cervical spine.

## 2020-09-29 IMAGING — CR DG LUMBAR SPINE COMPLETE 4+V
5 series · 5 of 5 positions shown · non-contrast
Comparison: CT [DATE]

CLINICAL DATA: Fall, back pain

EXAM:
LUMBAR SPINE - COMPLETE 4+ VIEW

[x lumbar spine lat (1 of 3)]
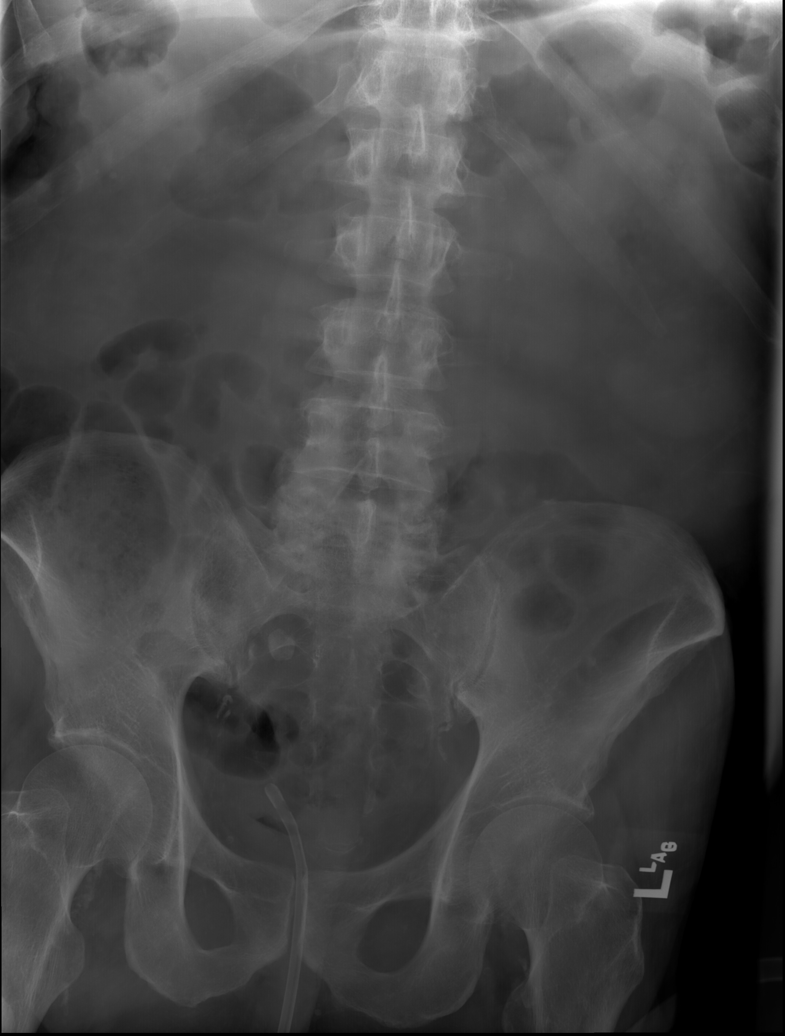

[x lumbar spine lat (2 of 3)]
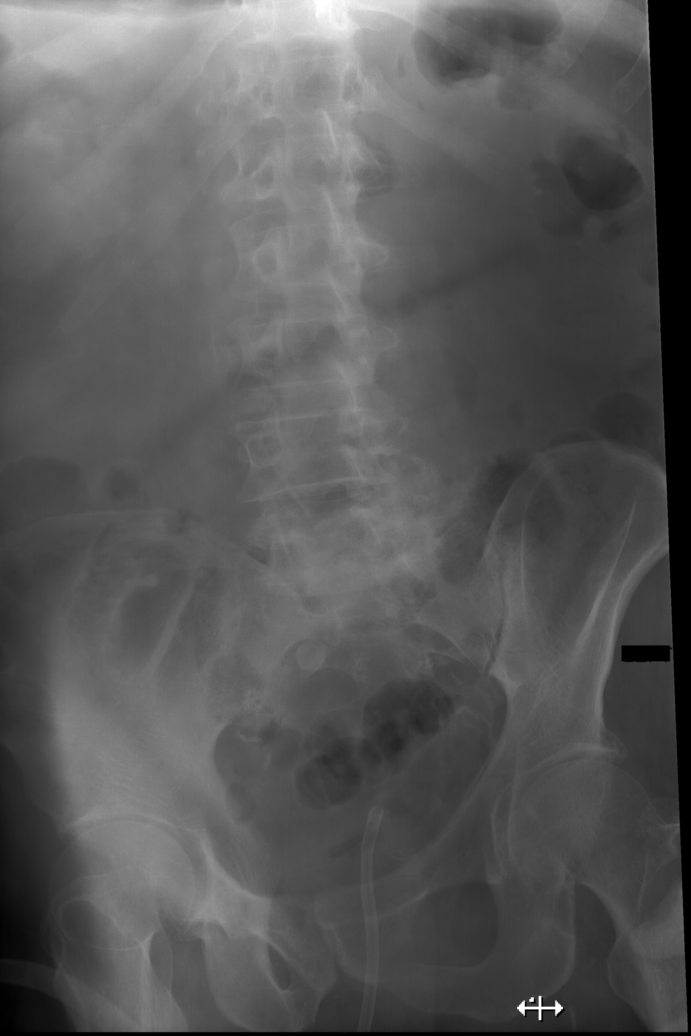

[x lumbar spine lat (3 of 3)]
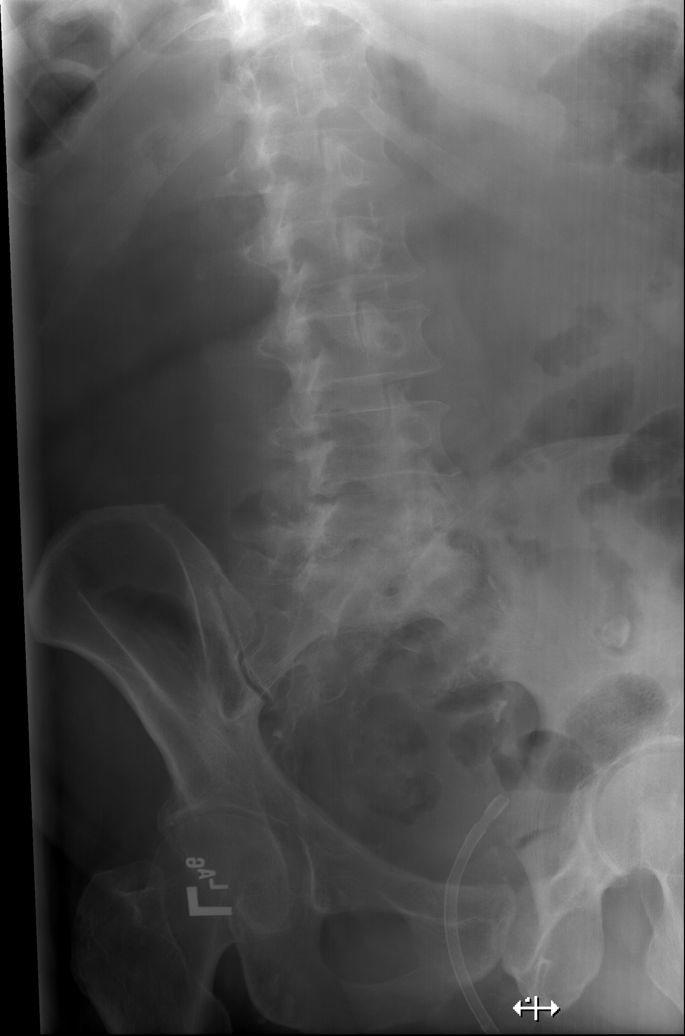

[w lumbar spine lat (1 of 2)]
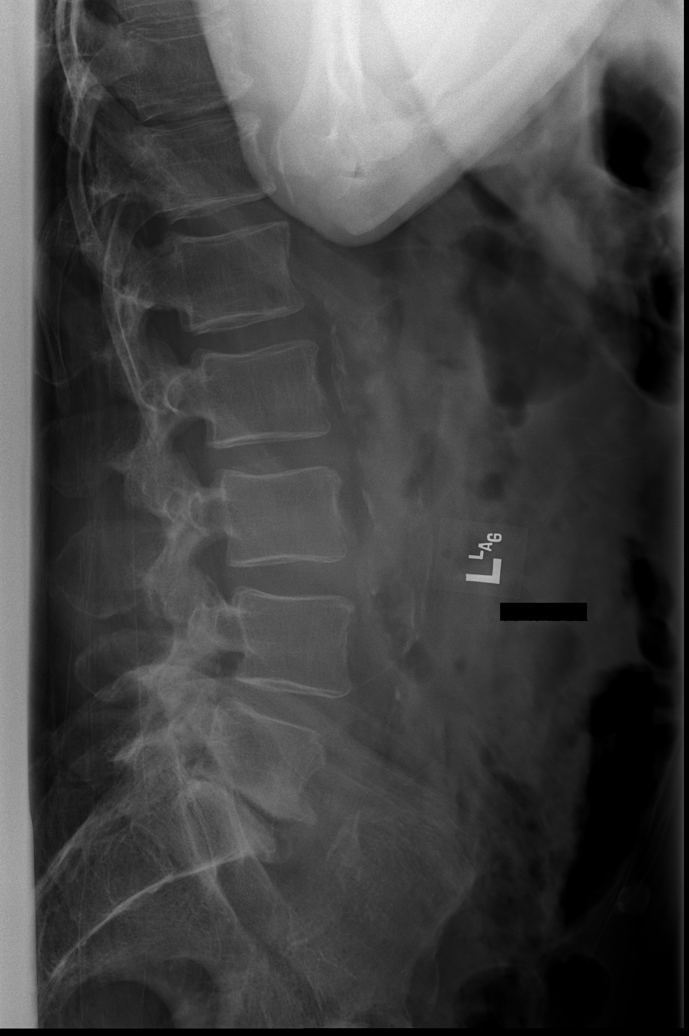

[w lumbar spine lat (2 of 2)]
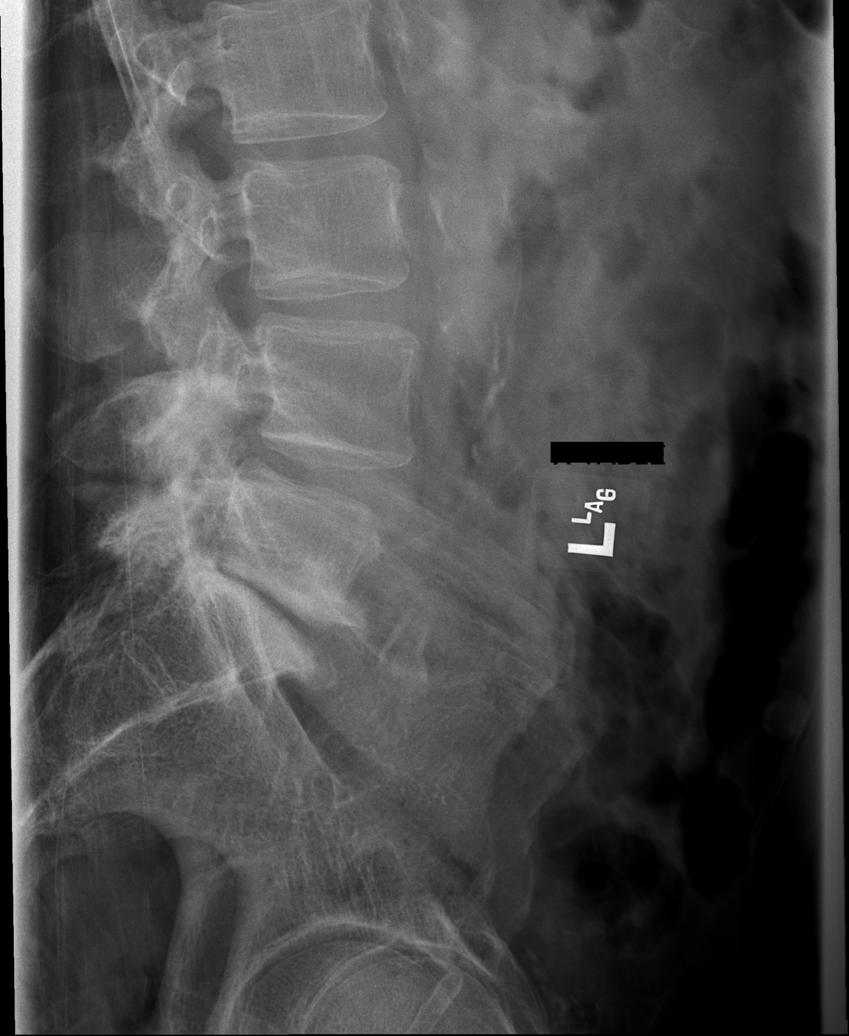

[5 of 5 positions shown; findings below may reference images not displayed]

FINDINGS: No acute fracture or listhesis of the lumbar spine. Vertebral body
height has been preserved. There is intervertebral disc space
narrowing and endplate remodeling at L5-S1 in keeping with changes
of advanced degenerative disc disease. Remaining intervertebral disc
heights are preserved. The paraspinal soft tissues are unremarkable.
Foley catheter balloon incidentally noted within the expected
bladder. Vascular calcifications are seen within the abdominal
aorta.
IMPRESSION: No acute fracture or listhesis.

## 2020-09-29 IMAGING — CT CT HEAD W/O CM
4 series · 15 of 47 positions shown, 17 images · non-contrast
Comparison: MRI C-spine [DATE], CT head [DATE]

CLINICAL DATA: Fall, chronic anticoagulation

EXAM:
CT HEAD WITHOUT CONTRAST
CT CERVICAL SPINE WITHOUT CONTRAST
TECHNIQUE: Multidetector CT imaging of the head and cervical spine was
performed following the standard protocol without intravenous
contrast. Multiplanar CT image reconstructions of the cervical spine
were also generated.

[Series 3: head wo · axial · 0.47mm/px · z∈[-97,+23]mm · 7 of 32 slices shown, 9 images]
[im 4/32  brain]
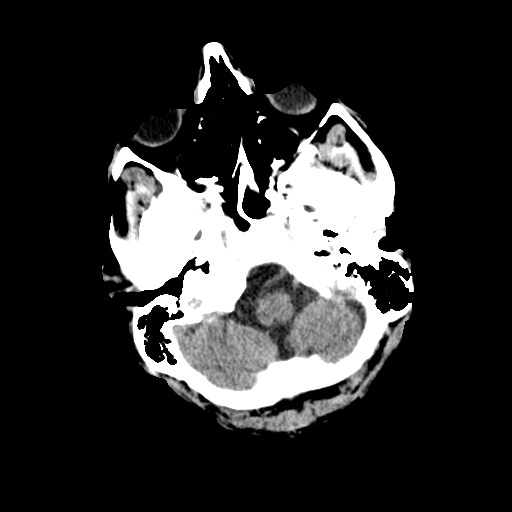
[im 4/32  bone]
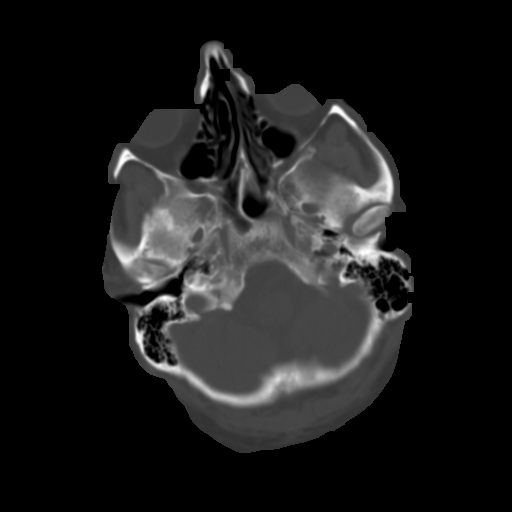
[im 8/32  brain]
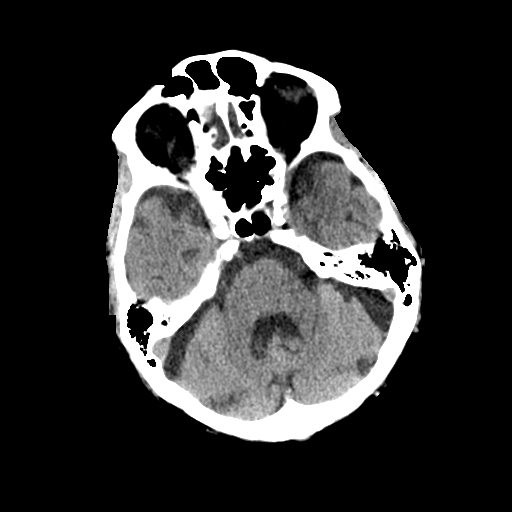
[im 12/32  brain]
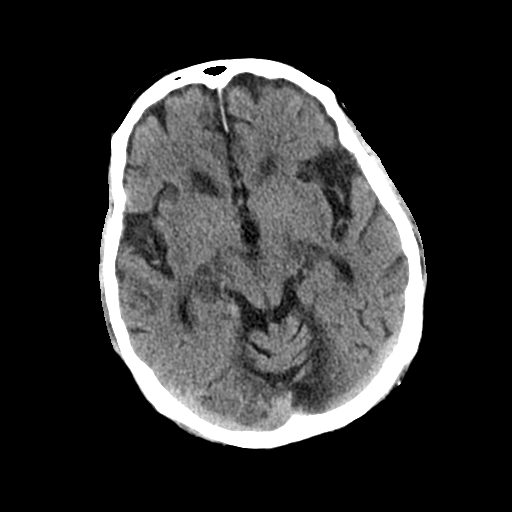
[im 16/32  brain]
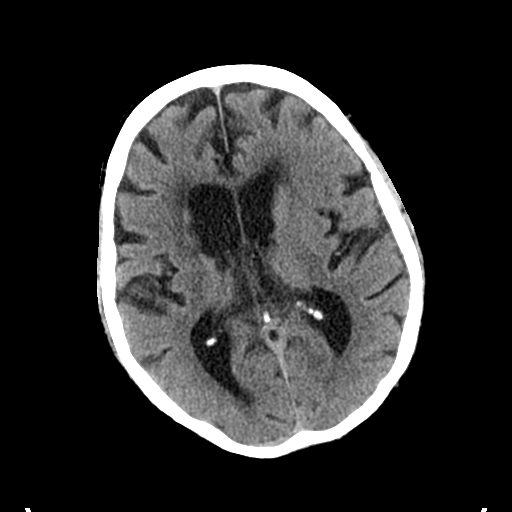
[im 20/32  brain]
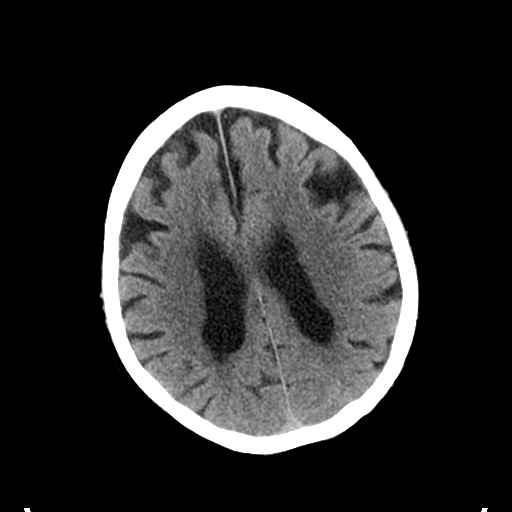
[im 20/32  bone]
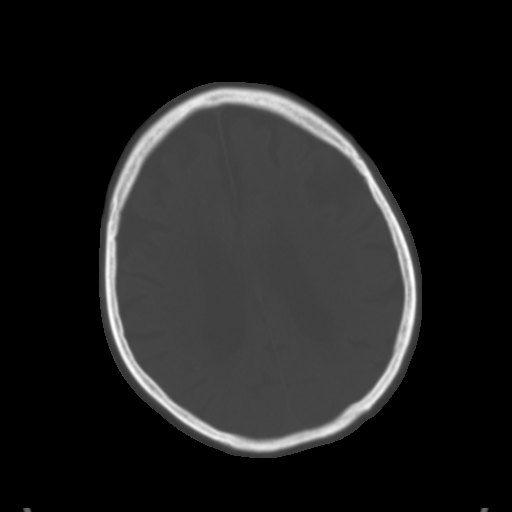
[im 24/32  brain]
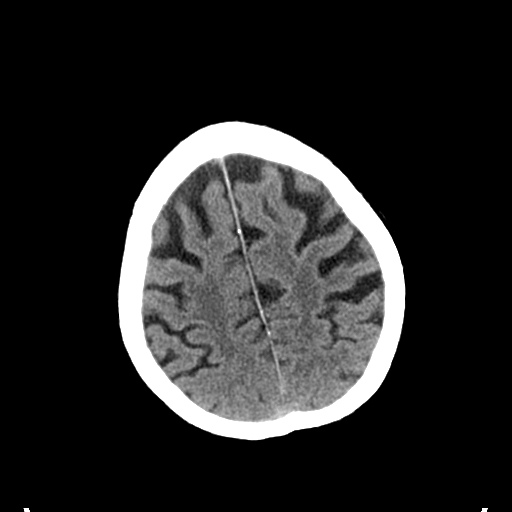
[im 28/32  brain]
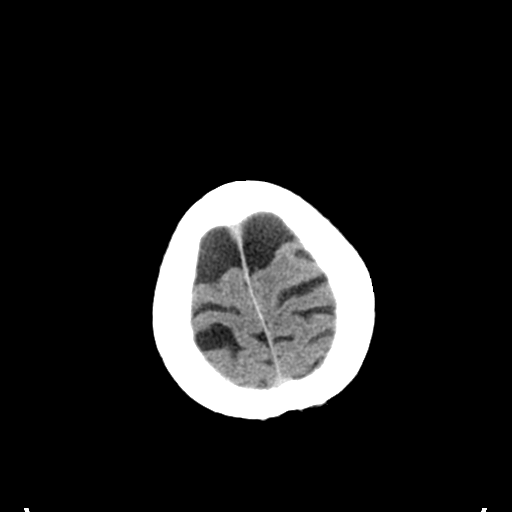

[Series 4: head bone · axial · 0.47mm/px · z∈[-98,-82]mm · 2 of 80 slices shown]
[im 8/80  bone]
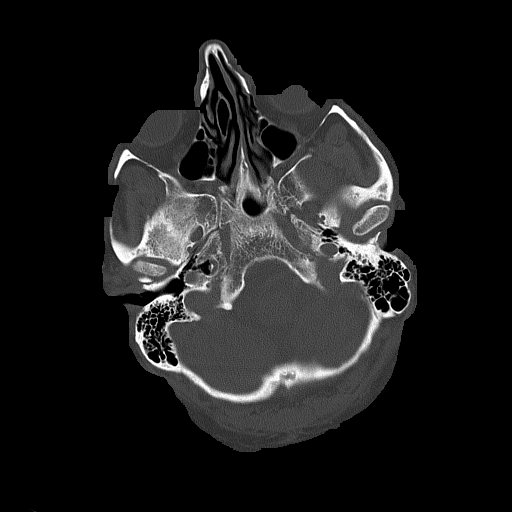
[im 16/80  bone]
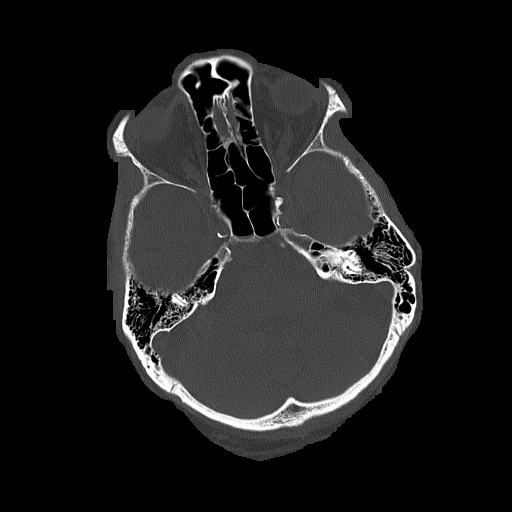

[Series 5: coronal soft tissue · coronal · 0.31mm/px · 3 of 66 slices shown]
[im 22/66  brain]
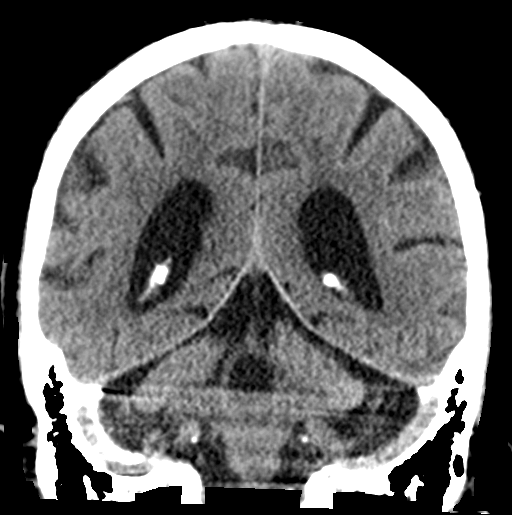
[im 29/66  brain]
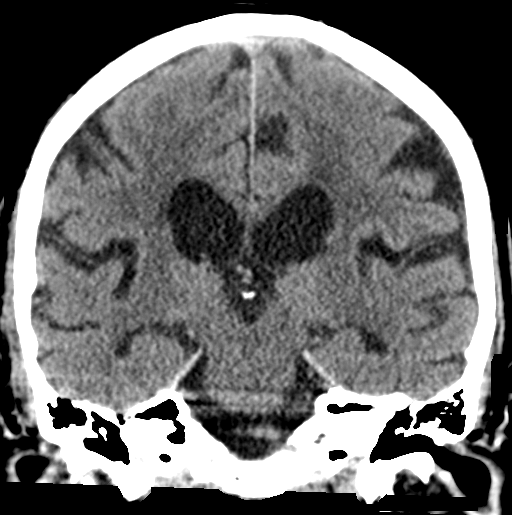
[im 37/66  brain]
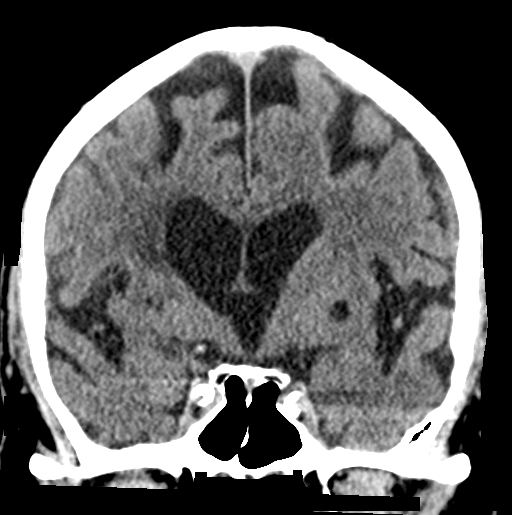

[Series 6: sagittal soft tissue · sagittal · 0.31mm/px · 3 of 52 slices shown]
[im 18/52  brain]
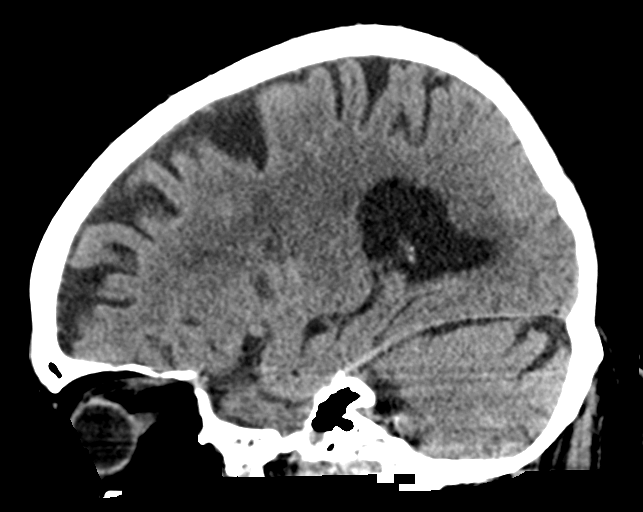
[im 26/52  brain]
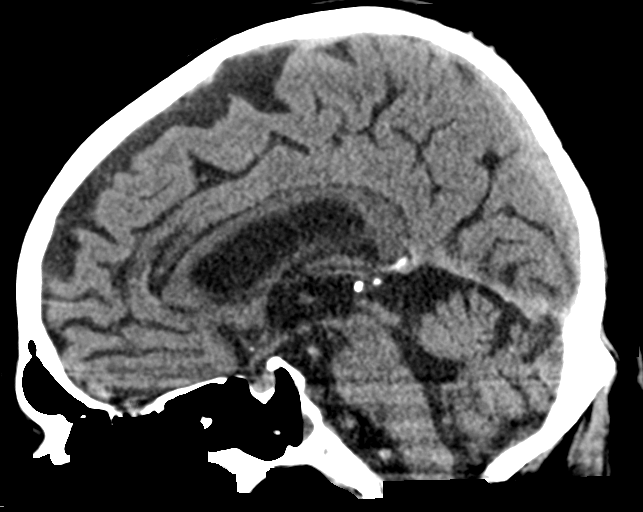
[im 34/52  brain]
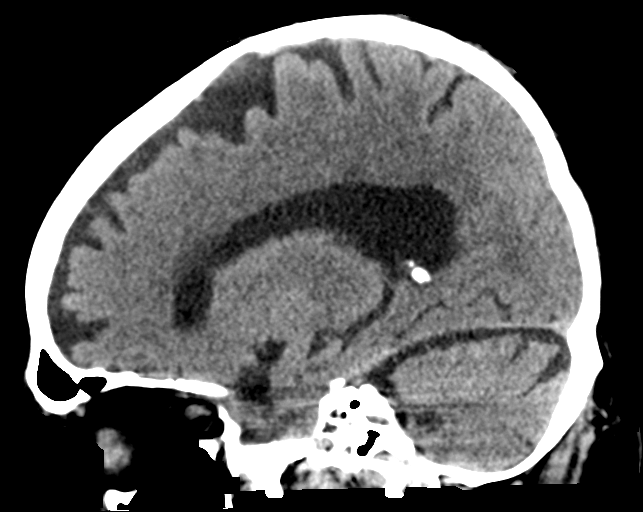

[15 of 47 positions shown; findings below may reference images not displayed]

FINDINGS: CT HEAD FINDINGS

Brain: Normal anatomic configuration. Parenchymal volume loss is
commensurate with the patient's age. Moderate periventricular white
matter changes are present likely reflecting the sequela of small
vessel ischemia. Remote lacunar infarcts are noted within the
lentiform nuclei bilaterally as well as the right caudate nucleus.
No abnormal intra or extra-axial mass lesion or fluid collection. No
abnormal mass effect or midline shift. No evidence of acute
intracranial hemorrhage or infarct. Ventricular size is normal.
Cerebellum unremarkable.

Vascular: No asymmetric hyperdense vasculature at the skull base.
Advanced vascular calcifications are noted within the carotid
siphons.

Skull: Intact

Sinuses/Orbits: Paranasal sinuses are clear. Orbits are
unremarkable.

Other: Mastoid air cells and middle ear cavities are clear.

CT CERVICAL SPINE FINDINGS

Alignment: Stable mild retrolisthesis at C4-5.

Skull base and vertebrae: Craniocervical alignment is normal. The
atlantodental interval is not widened. No acute fracture of the
cervical spine. Ankylosis of the left C2-3 facet joint.

Soft tissues and spinal canal: Moderate central canal stenosis with
mild flattening of the thecal sac at C4-5 and C5-6. No canal
hematoma. The prevertebral soft tissues are not thickened. No
paraspinal fluid collections.

Disc levels: There is diffuse intervertebral disc space narrowing
and endplate remodeling throughout the cervical spine in keeping
with changes of moderate to severe degenerative disc disease.
Vertebral body height has been preserved. The prevertebral soft
tissues are not thickened on sagittal reformats. Review of the axial
images demonstrates multilevel uncovertebral and facet arthrosis
resulting in multilevel mild-to-moderate neuroforaminal narrowing,
most severe on the left at C5-6,

Upper chest: Unremarkable

Other: None
IMPRESSION: No acute intracranial abnormality.  No calvarial fracture.

No acute fracture or listhesis of the cervical spine.

## 2020-09-29 MED ORDER — ACETAMINOPHEN 325 MG PO TABS
650.0000 mg | ORAL_TABLET | Freq: Once | ORAL | Status: DC
  Filled 2020-09-29: qty 2

## 2020-09-29 NOTE — ED Notes (Signed)
Called [facility] for transport back to [facility]

## 2020-09-29 NOTE — ED Provider Notes (Signed)
[facility] DEPT Provider Note   CSN: [id number] Arrival date & time: 09/29/20  0016     History Chief Complaint  Patient presents with   [name]    Jason Hartman is a 85 y.o. male.  Patient presents to the emergency department for evaluation after a fall.  Patient comes by ambulance from [facility] health care.  Patient reports that he sat on the edge of his bed in an attempt to get up, slipped and fell onto his left side.  Patient complaining of left lower back pain.  He also has noticed some neck pain which is not chronic.      Past Medical History:  Diagnosis Date   Cellulitis and abscess of leg, except foot    Hypertension    Obstructive sleep apnea (adult) (pediatric)    Pain in joint, pelvic region and thigh    Pneumonia, organism unspecified(486)    Restless legs syndrome (RLS)    RLS (restless legs syndrome) 09/01/2012   Thoracic or lumbosacral neuritis or radiculitis, unspecified    Type II or unspecified type diabetes mellitus without mention of complication, not stated as uncontrolled    Unspecified disease of pericardium    Unspecified hereditary and idiopathic peripheral neuropathy     Patient Active Problem List   Diagnosis Date Noted   AMS (altered mental status)    PAF (paroxysmal atrial fibrillation) (HCC)    Acute metabolic encephalopathy 34/74/2595   Anxiety 09/09/2020   SIRS (systemic inflammatory response syndrome) ([place]) 09/09/2020   Diaphoresis    Slow transit constipation 08/07/2020   Abdominal aortic aneurysm without rupture ([place]) 03/17/2020   Abnormal gait 03/17/2020   Ascending aorta dilatation ([place]) 03/17/2020   Diabetic renal disease ([place]) 03/17/2020   Recurrent UTI 07/16/2019   Hypertensive urgency 04/26/2019   Malignant HTN with heart disease, w/o CHF, w/o chronic kidney disease 04/26/2019   Elevated troponin    LFTs abnormal    Statin intolerance 01/15/2019   Penis pain 04/10/2018   Urethra cancer ([place])  02/25/2018   Lower extremity edema 06/09/2017   Peptic ulcer disease 05/16/2017   GERD (gastroesophageal reflux disease) 05/16/2017   Chronic kidney disease, stage 3a ([place]) 04/11/2017   Lower urinary tract symptoms (LUTS) 04/15/2016   Narcotic dependence ([place]) 08/31/2015   Imbalance 08/31/2015   Diabetic peripheral neuropathy ([place]) 08/31/2015   Other malaise and fatigue 05/02/2015   Chronic fatigue 05/02/2015   Panic disorder without agoraphobia 03/22/2015   Hyperlipidemia 02/03/2014   Thrombocytopenia ([place]) 08/18/2013   Parkinsonism ([place]) 08/18/2013   Other disorders of lung 08/18/2013   Organic impotence 08/18/2013   Memory loss 08/18/2013   Low back pain 08/18/2013   Iron deficiency anemia 08/18/2013   Hypercalcemia 08/18/2013   Cellulitis of right leg 08/18/2013   Atherosclerotic heart disease of native coronary artery without angina pectoris 08/18/2013   RLS (restless legs syndrome) 09/01/2012   HERPES SIMPLEX INFECTION 11/06/2006   Type II diabetes mellitus ([place]) 11/06/2006   HYPOGONADISM 11/06/2006   VITAMIN D DEFICIENCY 11/06/2006   ANXIETY 11/06/2006   OBSTRUCTIVE SLEEP APNEA 11/06/2006   RESTLESS LEG SYNDROME 11/06/2006   HYPERTENSION 11/06/2006   BENIGN PROSTATIC HYPERTROPHY 11/06/2006   ROSACEA 11/06/2006   OSTEOARTHRITIS 11/06/2006   INSOMNIA 11/06/2006   HYPERGLYCEMIA 11/06/2006   COLONIC POLYPS, HX OF 11/06/2006    Past Surgical History:  Procedure Laterality Date   ANAL FISSURE REPAIR     pericarditis  2009       Family History  Problem Relation Age of Onset   Diabetes Father     Social History   Tobacco Use   Smoking status: Former    Pack years: 0.00    Types: Cigarettes    Quit date: 09/01/1980    Years since quitting: 40.1   Smokeless tobacco: Never  Vaping Use   Vaping Use: Never used  Substance Use Topics   Alcohol use: No   Drug use: No    Home Medications Prior to Admission medications   Medication Sig Start Date End Date  Taking? Authorizing Provider  acetaminophen (TYLENOL) 500 MG tablet Take 500 mg by mouth every 6 (six) hours as needed for mild pain.    [provider]  allopurinol (ZYLOPRIM) 100 MG tablet Take 2 tablets daily. Patient not taking: No sig reported 02/03/17   Trula Slade, DPM  amiodarone (PACERONE) 200 MG tablet Take 1 tablet (200 mg total) by mouth daily. 09/26/20   Mercy Riding, MD  apixaban (ELIQUIS) 5 MG TABS tablet Take 1 tablet (5 mg total) by mouth 2 (two) times daily. 09/29/20 10/29/20  Mercy Riding, MD  BD PEN NEEDLE NANO 2ND GEN 32G X 4 MM MISC FOR LANTUS AND VICTOZA PENS 11/30/19   [provider]  ferrous sulfate 325 (65 FE) MG EC tablet Take 325 mg by mouth daily.  05/28/18   [provider]  fluticasone (FLONASE) 50 MCG/ACT nasal spray Place 2 sprays into both nostrils daily. 06/21/20   [provider]  gabapentin (NEURONTIN) 300 MG capsule Take 1 capsule (300 mg total) by mouth at bedtime. 09/25/20   Mercy Riding, MD  LANTUS SOLOSTAR 100 UNIT/ML Solostar Pen Inject 8 Units into the skin 2 (two) times daily. 09/25/20   Mercy Riding, MD  metFORMIN (GLUCOPHAGE-XR) 500 MG 24 hr tablet Take 500 mg by mouth daily with breakfast. 01/17/20   [provider]  metoprolol tartrate (LOPRESSOR) 25 MG tablet Take 0.5 tablets (12.5 mg total) by mouth 2 (two) times daily. 09/25/20   Mercy Riding, MD  ofloxacin (OCUFLOX) 0.3 % ophthalmic solution Place 1 drop into both eyes 4 (four) times daily as needed (eye irritation). 03/07/19   [provider]  ondansetron (ZOFRAN-ODT) 4 MG disintegrating tablet Take 4 mg by mouth every 8 (eight) hours as needed for nausea or vomiting. 02/07/20   [provider]  polyethylene glycol powder (GLYCOLAX/MIRALAX) 17 GM/SCOOP powder Take 17 g by mouth daily.    [provider]  Rotigotine (NEUPRO) 8 MG/24HR PT24 Place 1 patch onto the skin every evening.    [provider]   Semaglutide,0.25 or 0.5MG /DOS, (OZEMPIC, 0.25 OR 0.5 MG/DOSE,) 2 MG/1.5ML SOPN Inject 0.5 mg into the skin every Tuesday.    [provider]  senna-docusate (SENOKOT-S) 8.6-50 MG tablet Take 1 tablet by mouth 2 (two) times daily as needed for moderate constipation. 09/25/20   Mercy Riding, MD  tadalafil (CIALIS) 5 MG tablet Take 5 mg by mouth daily. 04/13/20   [provider]  thiamine 100 MG tablet Take 1 tablet (100 mg total) by mouth daily. 09/26/20   Mercy Riding, MD    Allergies    Oxycodone, Iodinated diagnostic agents, Iodine, Linezolid, Methadone hcl, Promethazine, Morphine sulfate, Other, Oxycodone hcl, Quetiapine, Statins, Zolpidem, Atorvastatin, Carbidopa-levodopa, Chlorhexidine gluconate [chlorhexidine], Clindamycin, Colesevelam, Doxazosin, Lovastatin, and Rosuvastatin  Review of Systems   Review of Systems  Musculoskeletal:  Positive for back pain.  All other systems reviewed and are  negative.  Physical Exam Updated Vital Signs BP 111/61   Pulse 65   Temp 97.9 F (36.6 C) (Oral)   Resp 18   Ht 5\' 4"  (1.626 m)   Wt 79.8 kg   SpO2 97%   BMI 30.20 kg/m   Physical Exam Vitals and nursing note reviewed.  Constitutional:      General: He is not in acute distress.    Appearance: Normal appearance. He is well-developed.  HENT:     Head: Normocephalic and atraumatic.     Right Ear: Hearing normal.     Left Ear: Hearing normal.     Nose: Nose normal.  Eyes:     Conjunctiva/sclera: Conjunctivae normal.     Pupils: Pupils are equal, round, and reactive to light.  Cardiovascular:     Rate and Rhythm: Regular rhythm.     Heart sounds: S1 normal and S2 normal. No murmur heard.   No friction rub. No gallop.  Pulmonary:     Effort: Pulmonary effort is normal. No respiratory distress.     Breath sounds: Normal breath sounds.  Chest:     Chest wall: No tenderness.  Abdominal:     General: Bowel sounds are normal.     Palpations: Abdomen is soft.      Tenderness: There is no abdominal tenderness. There is no guarding or rebound. Negative signs include Murphy's sign and McBurney's sign.     Hernia: No hernia is present.  Musculoskeletal:        General: Normal range of motion.     Cervical back: Normal range of motion and neck supple.     Thoracic back: Normal.     Lumbar back: Tenderness present. No bony tenderness. Negative right straight leg raise test and negative left straight leg raise test.       Back:     Right hip: Normal.     Left hip: Normal.  Skin:    General: Skin is warm and dry.     Findings: No rash.  Neurological:     Mental Status: He is alert and oriented to person, place, and time.     GCS: GCS eye subscore is 4. GCS verbal subscore is 5. GCS motor subscore is 6.     Cranial Nerves: No cranial nerve deficit.     Sensory: No sensory deficit.     Coordination: Coordination normal.  Psychiatric:        Speech: Speech normal.        Behavior: Behavior normal.        Thought Content: Thought content normal.    ED Results / Procedures / Treatments   Labs (all labs ordered are listed, but only abnormal results are displayed) Labs Reviewed - No data to display  EKG None  Radiology DG Lumbar Spine Complete  Result Date: 09/29/2020 CLINICAL DATA:  Fall, back pain EXAM: LUMBAR SPINE - COMPLETE 4+ VIEW COMPARISON:  CT 02/09/2010 FINDINGS: No acute fracture or listhesis of the lumbar spine. Vertebral body height has been preserved. There is intervertebral disc space narrowing and endplate remodeling at Y3-K1 in keeping with changes of advanced degenerative disc disease. Remaining intervertebral disc heights are preserved. The paraspinal soft tissues are unremarkable. Foley catheter balloon incidentally noted within the expected bladder. Vascular calcifications are seen within the abdominal aorta. IMPRESSION: No acute fracture or listhesis. Electronically Signed   By: Fidela Salisbury MD   On: 09/29/2020 02:13   CT HEAD  WO CONTRAST  Result Date:  09/29/2020 CLINICAL DATA:  Fall, chronic anticoagulation EXAM: CT HEAD WITHOUT CONTRAST CT CERVICAL SPINE WITHOUT CONTRAST TECHNIQUE: Multidetector CT imaging of the head and cervical spine was performed following the standard protocol without intravenous contrast. Multiplanar CT image reconstructions of the cervical spine were also generated. COMPARISON:  MRI C-spine 09/19/2020, CT head 09/16/2020 FINDINGS: CT HEAD FINDINGS Brain: Normal anatomic configuration. Parenchymal volume loss is commensurate with the patient's age. Moderate periventricular white matter changes are present likely reflecting the sequela of small vessel ischemia. Remote lacunar infarcts are noted within the lentiform nuclei bilaterally as well as the right caudate nucleus. No abnormal intra or extra-axial mass lesion or fluid collection. No abnormal mass effect or midline shift. No evidence of acute intracranial hemorrhage or infarct. Ventricular size is normal. Cerebellum unremarkable. Vascular: No asymmetric hyperdense vasculature at the skull base. Advanced vascular calcifications are noted within the carotid siphons. Skull: Intact Sinuses/Orbits: Paranasal sinuses are clear. Orbits are unremarkable. Other: Mastoid air cells and middle ear cavities are clear. CT CERVICAL SPINE FINDINGS Alignment: Stable mild retrolisthesis at C4-5. Skull base and vertebrae: Craniocervical alignment is normal. The atlantodental interval is not widened. No acute fracture of the cervical spine. Ankylosis of the left C2-3 facet joint. Soft tissues and spinal canal: Moderate central canal stenosis with mild flattening of the thecal sac at C4-5 and C5-6. No canal hematoma. The prevertebral soft tissues are not thickened. No paraspinal fluid collections. Disc levels: There is diffuse intervertebral disc space narrowing and endplate remodeling throughout the cervical spine in keeping with changes of moderate to severe degenerative disc  disease. Vertebral body height has been preserved. The prevertebral soft tissues are not thickened on sagittal reformats. Review of the axial images demonstrates multilevel uncovertebral and facet arthrosis resulting in multilevel mild-to-moderate neuroforaminal narrowing, most severe on the left at C5-6, Upper chest: Unremarkable Other: None IMPRESSION: No acute intracranial abnormality.  No calvarial fracture. No acute fracture or listhesis of the cervical spine. Electronically Signed   By: Fidela Salisbury MD   On: 09/29/2020 02:22   CT CERVICAL SPINE WO CONTRAST  Result Date: 09/29/2020 CLINICAL DATA:  Fall, chronic anticoagulation EXAM: CT HEAD WITHOUT CONTRAST CT CERVICAL SPINE WITHOUT CONTRAST TECHNIQUE: Multidetector CT imaging of the head and cervical spine was performed following the standard protocol without intravenous contrast. Multiplanar CT image reconstructions of the cervical spine were also generated. COMPARISON:  MRI C-spine 09/19/2020, CT head 09/16/2020 FINDINGS: CT HEAD FINDINGS Brain: Normal anatomic configuration. Parenchymal volume loss is commensurate with the patient's age. Moderate periventricular white matter changes are present likely reflecting the sequela of small vessel ischemia. Remote lacunar infarcts are noted within the lentiform nuclei bilaterally as well as the right caudate nucleus. No abnormal intra or extra-axial mass lesion or fluid collection. No abnormal mass effect or midline shift. No evidence of acute intracranial hemorrhage or infarct. Ventricular size is normal. Cerebellum unremarkable. Vascular: No asymmetric hyperdense vasculature at the skull base. Advanced vascular calcifications are noted within the carotid siphons. Skull: Intact Sinuses/Orbits: Paranasal sinuses are clear. Orbits are unremarkable. Other: Mastoid air cells and middle ear cavities are clear. CT CERVICAL SPINE FINDINGS Alignment: Stable mild retrolisthesis at C4-5. Skull base and vertebrae:  Craniocervical alignment is normal. The atlantodental interval is not widened. No acute fracture of the cervical spine. Ankylosis of the left C2-3 facet joint. Soft tissues and spinal canal: Moderate central canal stenosis with mild flattening of the thecal sac at C4-5 and C5-6. No canal hematoma. The prevertebral soft tissues are  not thickened. No paraspinal fluid collections. Disc levels: There is diffuse intervertebral disc space narrowing and endplate remodeling throughout the cervical spine in keeping with changes of moderate to severe degenerative disc disease. Vertebral body height has been preserved. The prevertebral soft tissues are not thickened on sagittal reformats. Review of the axial images demonstrates multilevel uncovertebral and facet arthrosis resulting in multilevel mild-to-moderate neuroforaminal narrowing, most severe on the left at C5-6, Upper chest: Unremarkable Other: None IMPRESSION: No acute intracranial abnormality.  No calvarial fracture. No acute fracture or listhesis of the cervical spine. Electronically Signed   By: Fidela Salisbury MD   On: 09/29/2020 02:22    Procedures Procedures   Medications Ordered in ED Medications  acetaminophen (TYLENOL) tablet 650 mg (0 mg Oral Hold 09/29/20 0241)    ED Course  I have reviewed the triage vital signs and the nursing notes.  Pertinent labs & imaging results that were available during my care of the patient were reviewed by me and considered in my medical decision making (see chart for details).    MDM Rules/Calculators/A&P                          Patient presents to the emergency department for evaluation after a fall.  Patient reports that he slipped out of bed.  EMS report was that he slipped and fell onto his side.  Patient complaining of lower back pain.  Examination reveals no midline tenderness, tenderness is in the right paraspinal region.  Normal range of motion of both hips.  Thoracic examination is unremarkable.   Patient complaining of mild neck pain.  He underwent CT head and cervical spine to further evaluate, no abnormality.  Lumbar spine without acute fracture.  Patient appropriate for return to nursing facility.  Final Clinical Impression(s) / ED Diagnoses Final diagnoses:  Fall, initial encounter  Contusion of lower back, initial encounter    Rx / DC Orders ED Discharge Orders     None        Adeola Dennen, Gwenyth Allegra, MD 09/29/20 [phone]

## 2020-09-29 NOTE — ED Triage Notes (Signed)
Pt arrived via EMS from [id number]. Pt slipped and fell on his bottom. Pt denies hitting his head. Pt is on eliquis. Pt states he has lower right back pain. Pt's left shoulder is permanently dislocated, this was not an injury from his fall. Pt has hx of a fib. Pt has hx of diabetes.

## 2020-10-02 DIAGNOSIS — M545 Low back pain, unspecified: Secondary | ICD-10-CM | POA: Diagnosis not present

## 2020-10-02 DIAGNOSIS — E1142 Type 2 diabetes mellitus with diabetic polyneuropathy: Secondary | ICD-10-CM | POA: Diagnosis not present

## 2020-10-02 DIAGNOSIS — R339 Retention of urine, unspecified: Secondary | ICD-10-CM | POA: Diagnosis not present

## 2020-10-02 DIAGNOSIS — N320 Bladder-neck obstruction: Secondary | ICD-10-CM | POA: Diagnosis not present

## 2020-10-02 DIAGNOSIS — M6281 Muscle weakness (generalized): Secondary | ICD-10-CM | POA: Diagnosis not present

## 2020-10-02 DIAGNOSIS — G2581 Restless legs syndrome: Secondary | ICD-10-CM | POA: Diagnosis not present

## 2020-10-02 DIAGNOSIS — N1831 Chronic kidney disease, stage 3a: Secondary | ICD-10-CM | POA: Diagnosis not present

## 2020-10-03 DIAGNOSIS — M6281 Muscle weakness (generalized): Secondary | ICD-10-CM | POA: Diagnosis not present

## 2020-10-03 DIAGNOSIS — M25512 Pain in left shoulder: Secondary | ICD-10-CM | POA: Diagnosis not present

## 2020-10-03 DIAGNOSIS — R319 Hematuria, unspecified: Secondary | ICD-10-CM | POA: Diagnosis not present

## 2020-10-03 DIAGNOSIS — R262 Difficulty in walking, not elsewhere classified: Secondary | ICD-10-CM | POA: Diagnosis not present

## 2020-10-03 DIAGNOSIS — R339 Retention of urine, unspecified: Secondary | ICD-10-CM | POA: Diagnosis not present

## 2020-10-03 DIAGNOSIS — G894 Chronic pain syndrome: Secondary | ICD-10-CM | POA: Diagnosis not present

## 2020-10-06 ENCOUNTER — Other Ambulatory Visit: Payer: Self-pay

## 2020-10-06 ENCOUNTER — Emergency Department (HOSPITAL_COMMUNITY): Payer: Medicare Other

## 2020-10-06 ENCOUNTER — Emergency Department (HOSPITAL_COMMUNITY)
Admission: EM | Admit: 2020-10-06 | Discharge: 2020-10-06 | Disposition: A | Discharge status: Home / Self Care | Payer: Medicare Other | Attending: Emergency Medicine | Admitting: Emergency Medicine

## 2020-10-06 DIAGNOSIS — M25551 Pain in right hip: Secondary | ICD-10-CM | POA: Insufficient documentation

## 2020-10-06 DIAGNOSIS — W19XXXA Unspecified fall, initial encounter: Secondary | ICD-10-CM

## 2020-10-06 DIAGNOSIS — M50323 Other cervical disc degeneration at C6-C7 level: Secondary | ICD-10-CM | POA: Diagnosis not present

## 2020-10-06 DIAGNOSIS — M5031 Other cervical disc degeneration,  high cervical region: Secondary | ICD-10-CM | POA: Diagnosis not present

## 2020-10-06 DIAGNOSIS — R9082 White matter disease, unspecified: Secondary | ICD-10-CM | POA: Diagnosis not present

## 2020-10-06 DIAGNOSIS — Z7901 Long term (current) use of anticoagulants: Secondary | ICD-10-CM | POA: Insufficient documentation

## 2020-10-06 DIAGNOSIS — M542 Cervicalgia: Secondary | ICD-10-CM | POA: Insufficient documentation

## 2020-10-06 DIAGNOSIS — E1122 Type 2 diabetes mellitus with diabetic chronic kidney disease: Secondary | ICD-10-CM | POA: Insufficient documentation

## 2020-10-06 DIAGNOSIS — N1831 Chronic kidney disease, stage 3a: Secondary | ICD-10-CM | POA: Insufficient documentation

## 2020-10-06 DIAGNOSIS — M545 Low back pain, unspecified: Secondary | ICD-10-CM

## 2020-10-06 DIAGNOSIS — S8001XA Contusion of right knee, initial encounter: Secondary | ICD-10-CM | POA: Diagnosis not present

## 2020-10-06 DIAGNOSIS — G20 Parkinson's disease: Secondary | ICD-10-CM | POA: Insufficient documentation

## 2020-10-06 DIAGNOSIS — G319 Degenerative disease of nervous system, unspecified: Secondary | ICD-10-CM | POA: Diagnosis not present

## 2020-10-06 DIAGNOSIS — I129 Hypertensive chronic kidney disease with stage 1 through stage 4 chronic kidney disease, or unspecified chronic kidney disease: Secondary | ICD-10-CM | POA: Insufficient documentation

## 2020-10-06 DIAGNOSIS — I2510 Atherosclerotic heart disease of native coronary artery without angina pectoris: Secondary | ICD-10-CM | POA: Insufficient documentation

## 2020-10-06 DIAGNOSIS — Y92002 Bathroom of unspecified non-institutional (private) residence single-family (private) house as the place of occurrence of the external cause: Secondary | ICD-10-CM | POA: Insufficient documentation

## 2020-10-06 DIAGNOSIS — Z7984 Long term (current) use of oral hypoglycemic drugs: Secondary | ICD-10-CM | POA: Insufficient documentation

## 2020-10-06 DIAGNOSIS — M1611 Unilateral primary osteoarthritis, right hip: Secondary | ICD-10-CM | POA: Diagnosis not present

## 2020-10-06 DIAGNOSIS — M50321 Other cervical disc degeneration at C4-C5 level: Secondary | ICD-10-CM | POA: Diagnosis not present

## 2020-10-06 DIAGNOSIS — S199XXA Unspecified injury of neck, initial encounter: Secondary | ICD-10-CM | POA: Diagnosis not present

## 2020-10-06 DIAGNOSIS — Z87891 Personal history of nicotine dependence: Secondary | ICD-10-CM | POA: Diagnosis not present

## 2020-10-06 DIAGNOSIS — E1140 Type 2 diabetes mellitus with diabetic neuropathy, unspecified: Secondary | ICD-10-CM | POA: Insufficient documentation

## 2020-10-06 DIAGNOSIS — Z8554 Personal history of malignant neoplasm of ureter: Secondary | ICD-10-CM | POA: Insufficient documentation

## 2020-10-06 DIAGNOSIS — Z452 Encounter for adjustment and management of vascular access device: Secondary | ICD-10-CM | POA: Diagnosis not present

## 2020-10-06 DIAGNOSIS — M25519 Pain in unspecified shoulder: Secondary | ICD-10-CM | POA: Diagnosis not present

## 2020-10-06 DIAGNOSIS — W1830XA Fall on same level, unspecified, initial encounter: Secondary | ICD-10-CM | POA: Insufficient documentation

## 2020-10-06 DIAGNOSIS — Z794 Long term (current) use of insulin: Secondary | ICD-10-CM | POA: Insufficient documentation

## 2020-10-06 DIAGNOSIS — M47816 Spondylosis without myelopathy or radiculopathy, lumbar region: Secondary | ICD-10-CM | POA: Diagnosis not present

## 2020-10-06 DIAGNOSIS — I480 Paroxysmal atrial fibrillation: Secondary | ICD-10-CM | POA: Insufficient documentation

## 2020-10-06 IMAGING — CT CT CERVICAL SPINE W/O CM
3 of 4 series · 13 of 33 positions shown, 16 images · non-contrast
Comparison: [DATE].

CLINICAL DATA: Fall at home today.

EXAM:
CT HEAD WITHOUT CONTRAST
CT CERVICAL SPINE WITHOUT CONTRAST
TECHNIQUE: Multidetector CT imaging of the head and cervical spine was
performed following the standard protocol without intravenous
contrast. Multiplanar CT image reconstructions of the cervical spine
were also generated.

[Series 4: c_spine 2.0 st · axial · 0.33mm/px · z∈[-276,-128]mm · 5 of 104 slices shown, 7 images]
[im 15/104  soft-tissue]
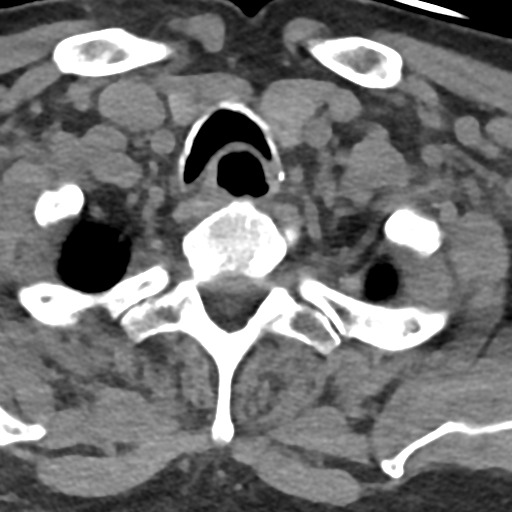
[im 15/104  bone]
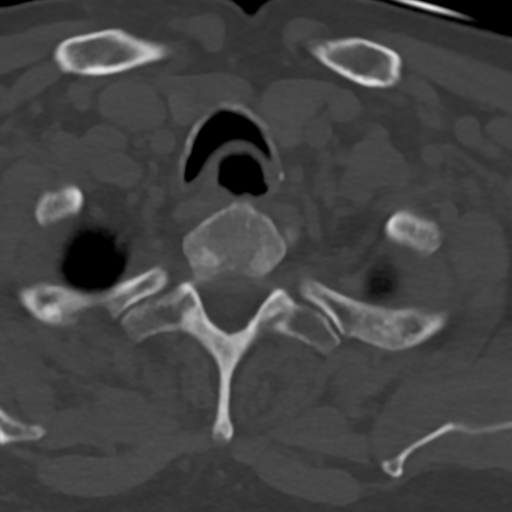
[im 30/104  bone]
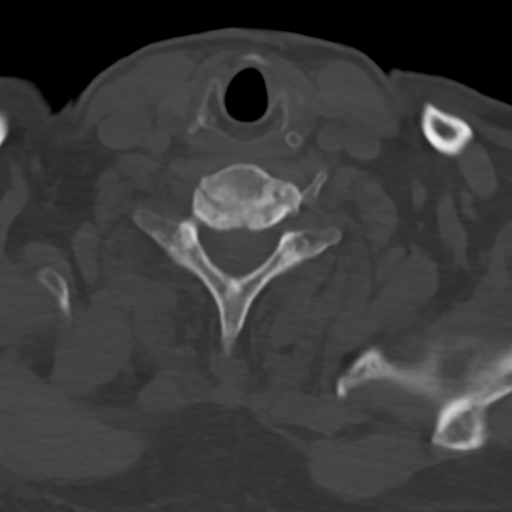
[im 59/104  bone]
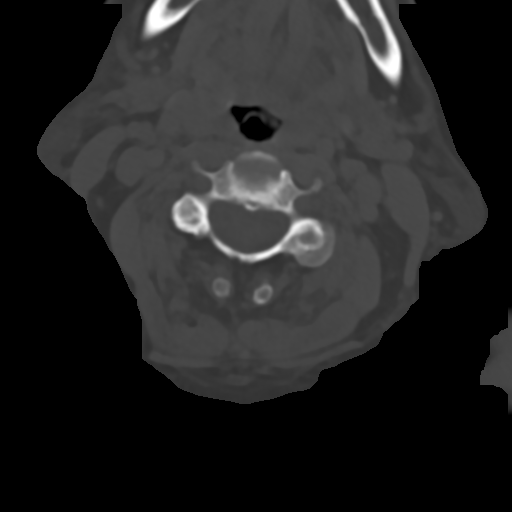
[im 74/104  bone]
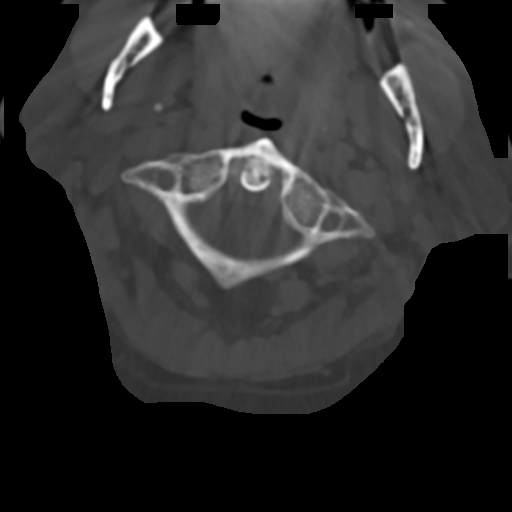
[im 89/104  soft-tissue]
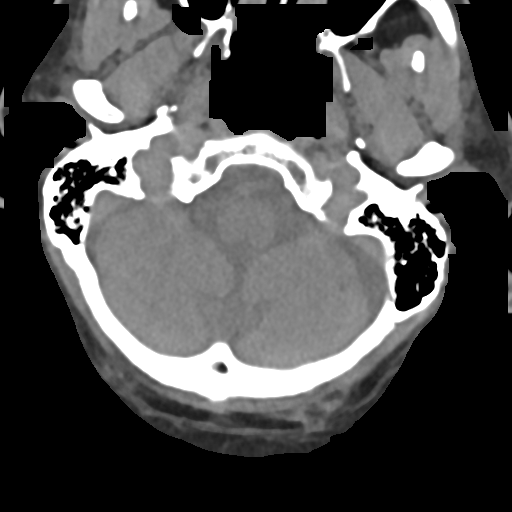
[im 89/104  bone]
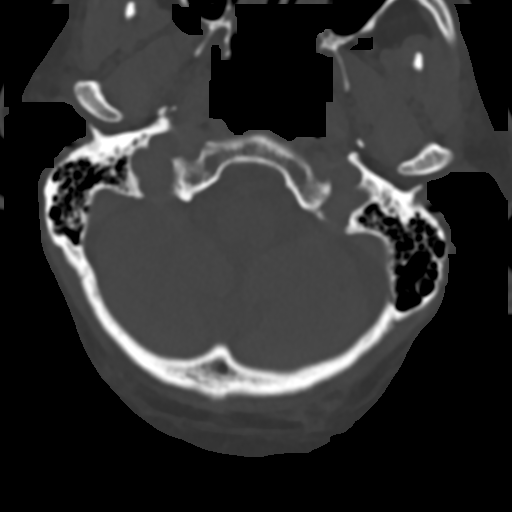

[Series 6: c_spine 2.0 sag bone · sagittal · 0.30mm/px · 5 of 61 slices shown, 6 images]
[im 21/61  bone]
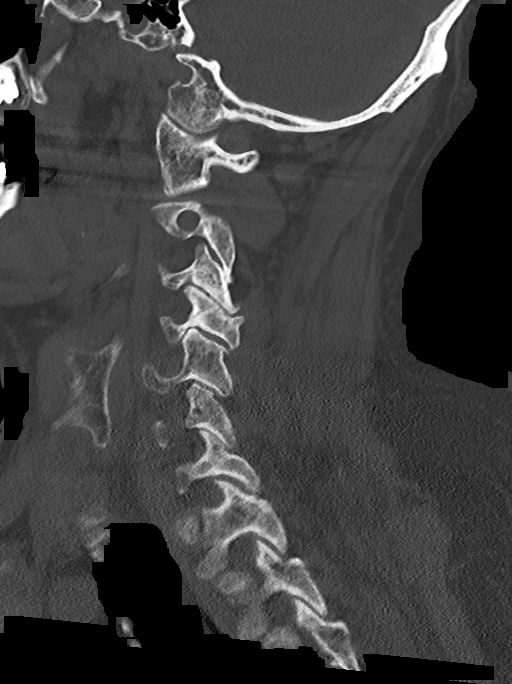
[im 26/61  bone]
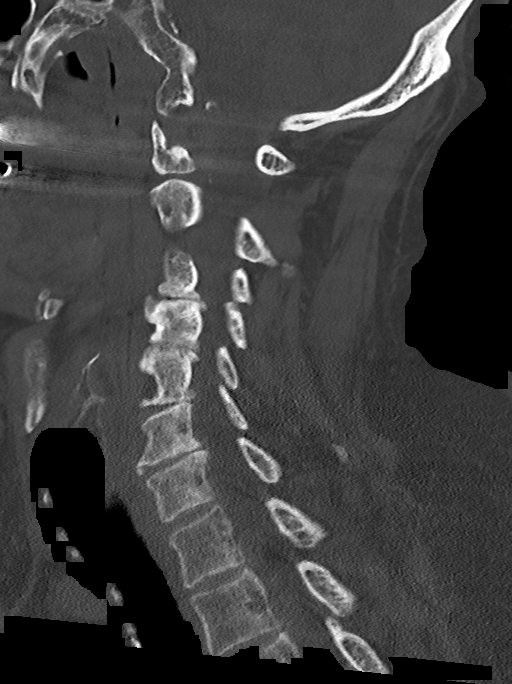
[im 31/61  soft-tissue]
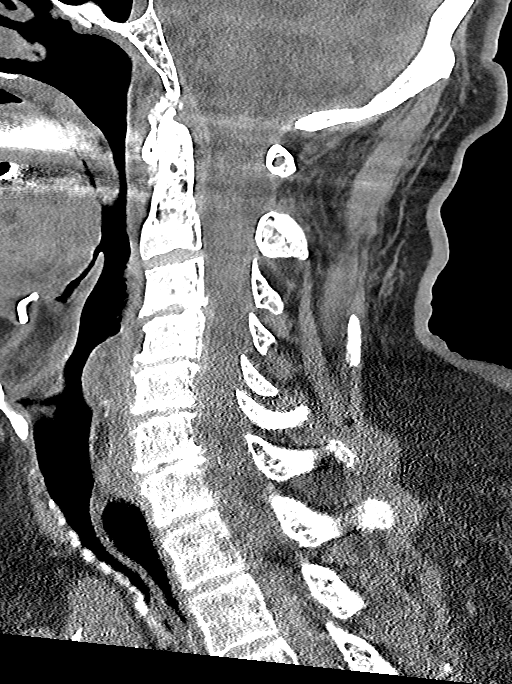
[im 31/61  bone]
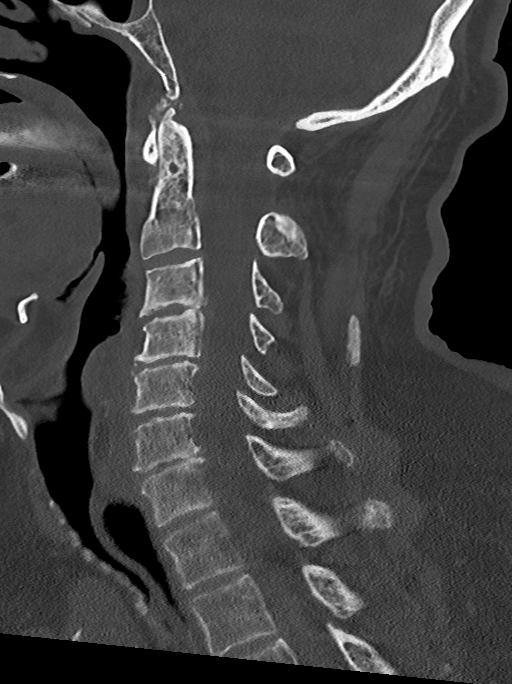
[im 36/61  bone]
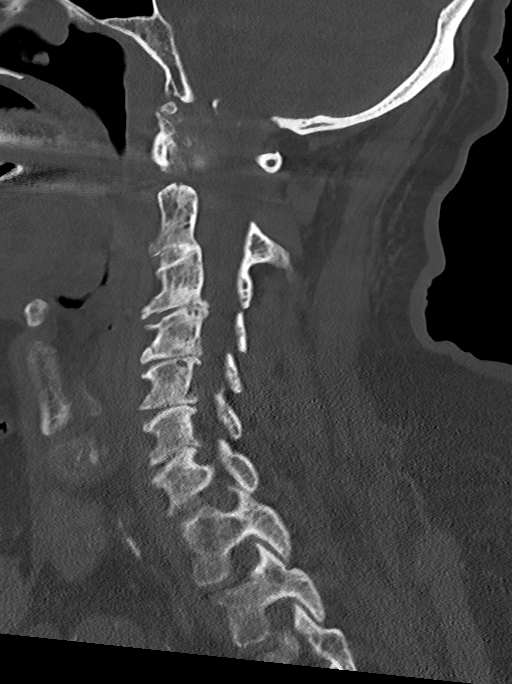
[im 41/61  bone]
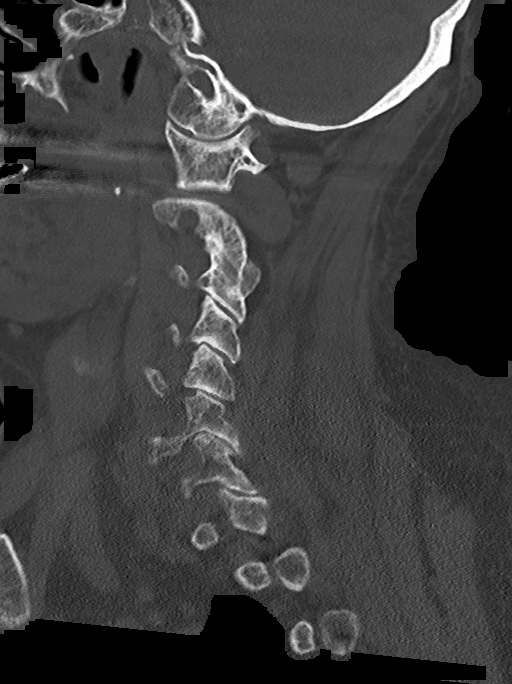

[Series 7: c_spine 2.0 cor bone · coronal · 0.30mm/px · 3 of 83 slices shown]
[im 17/83  bone]
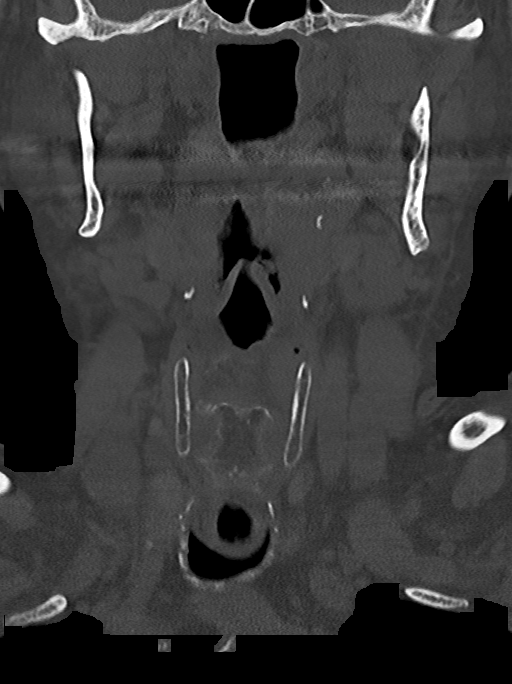
[im 33/83  bone]
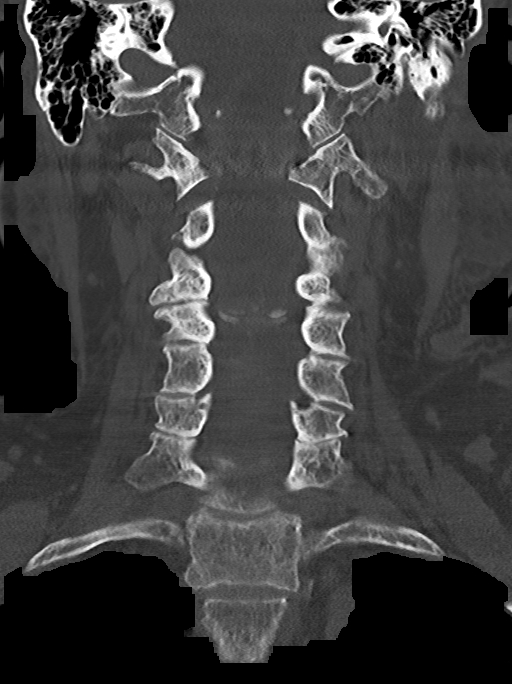
[im 50/83  bone]
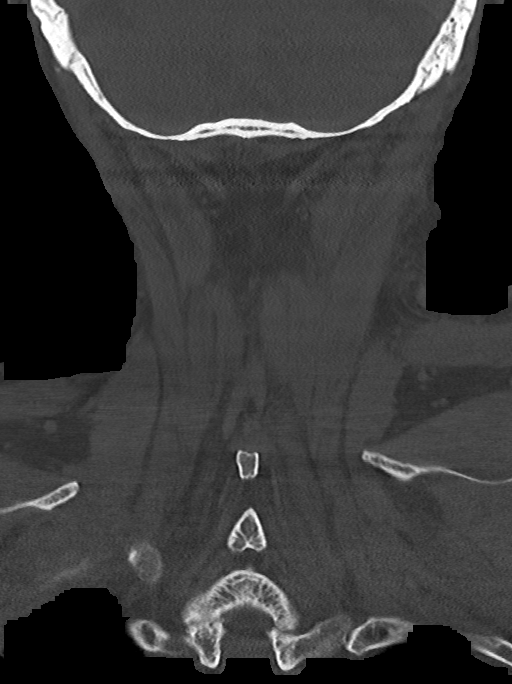

[13 of 33 positions shown; findings below may reference images not displayed]

FINDINGS: CT HEAD FINDINGS

Brain: Mild diffuse cortical atrophy is noted. Mild chronic ischemic
white matter disease is noted. No mass effect or midline shift is
noted. Ventricular size is within normal limits. There is no
evidence of mass lesion, hemorrhage or acute infarction.

Vascular: No hyperdense vessel or unexpected calcification.

Skull: Normal. Negative for fracture or focal lesion.

Sinuses/Orbits: No acute finding.

Other: None.

CT CERVICAL SPINE FINDINGS

Alignment: Normal.

Skull base and vertebrae: No acute fracture. No primary bone lesion
or focal pathologic process.

Soft tissues and spinal canal: No prevertebral fluid or swelling. No
visible canal hematoma.

Disc levels: Moderate degenerative disc disease is noted at C3-4,
C4-5, C5-6 and C6-7.

Upper chest: Negative.

Other: Degenerative changes are seen involving the right-sided
posterior facet joints.
IMPRESSION: No acute intracranial abnormality seen.

Multilevel degenerative disc disease. No acute abnormality seen in
the cervical spine.

## 2020-10-06 IMAGING — DX DG LUMBAR SPINE COMPLETE 4+V
5 series · 5 of 5 positions shown · non-contrast
Comparison: [DATE]

CLINICAL DATA: Fall, low back pain on the right side

EXAM:
LUMBAR SPINE - COMPLETE 4+ VIEW

[t lumbar spine ap]
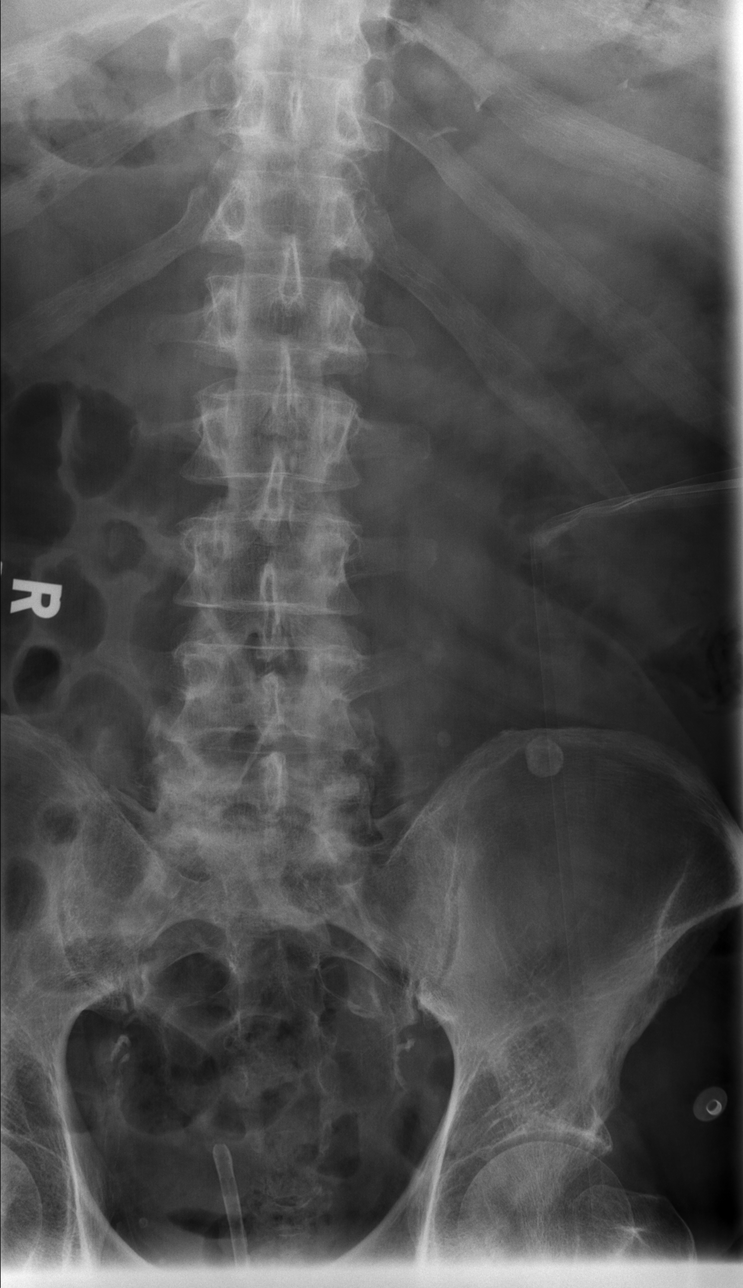

[t lumbar spine obl (1 of 2)]
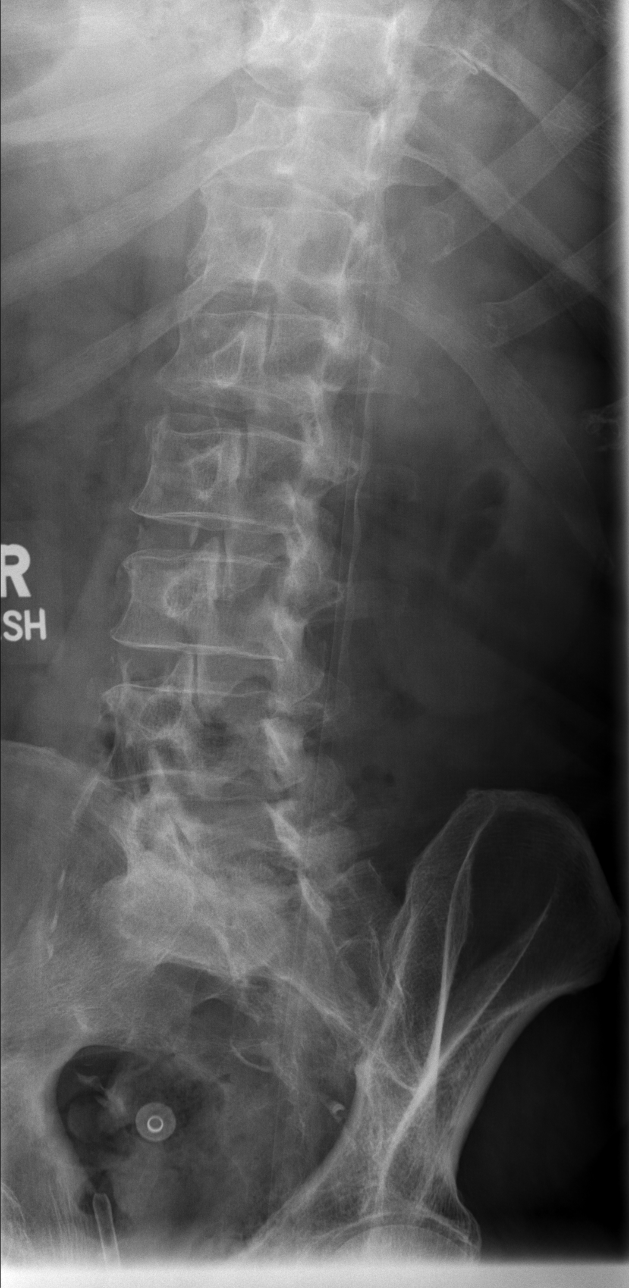

[t lumbar spine obl (2 of 2)]
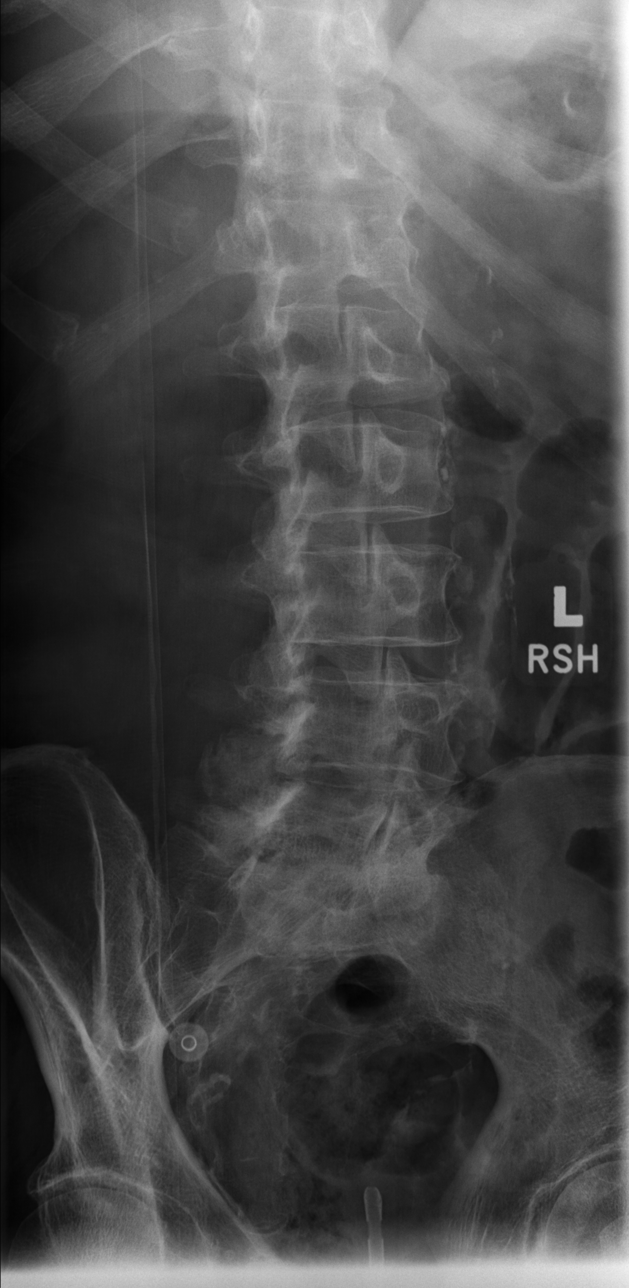

[t lumbar spine lat]
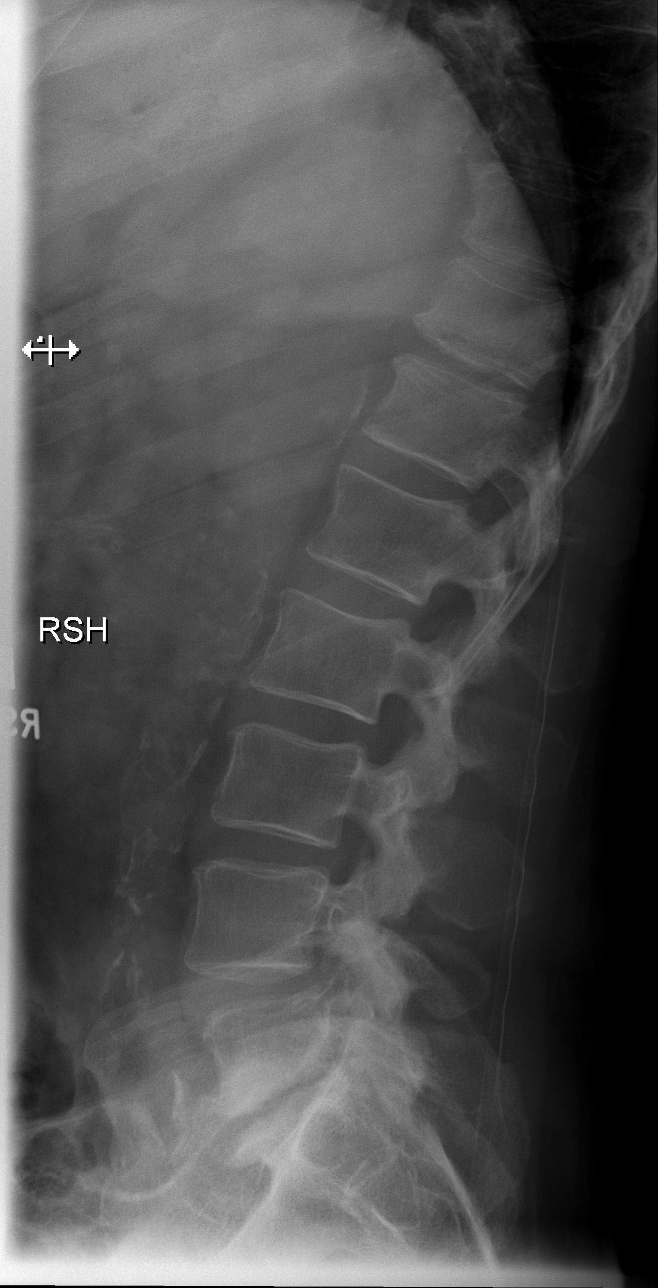

[t lumbar l-5 s-1 spot]
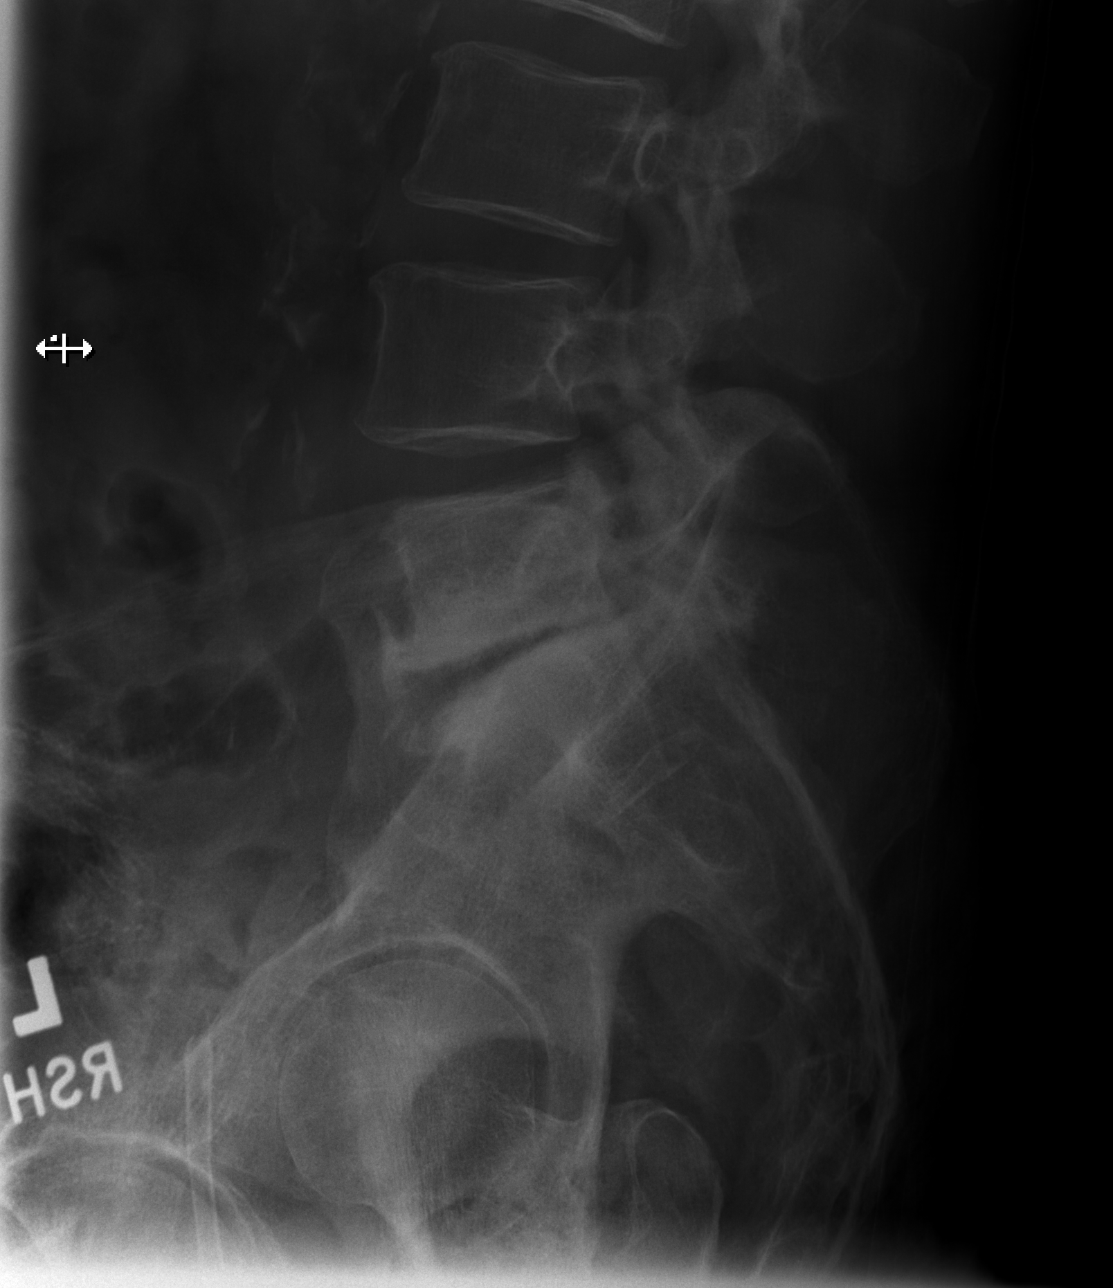

[5 of 5 positions shown; findings below may reference images not displayed]

FINDINGS: Vascular calcifications noted. A catheter projects over the lower
pelvis. Spurring of the SI joints noted.

Degenerative facet arthropathy bilaterally at L4-5 and L5-S1.
Prominent space in the facet joints at L[DATE] reflect facet joint
effusions. Loss of intervertebral disc height at L5-S1, similar to
prior, with endplate sclerosis which is likely degenerative. Stable
3 mm degenerative anterolisthesis at L4-5. No acute fracture is
identified. Aortoiliac atherosclerotic vascular disease.
IMPRESSION: 1. Stable lower lumbar spondylosis and degenerative disc disease. No
acute findings.
2. Grade 1 degenerative anterolisthesis at L4-5, stable.
3.  Aortic Atherosclerosis ([JR]-[JR]).

## 2020-10-06 IMAGING — CT CT HEAD W/O CM
4 series · 16 of 47 positions shown, 18 images · non-contrast
Comparison: [DATE].

CLINICAL DATA: Fall at home today.

EXAM:
CT HEAD WITHOUT CONTRAST
CT CERVICAL SPINE WITHOUT CONTRAST
TECHNIQUE: Multidetector CT imaging of the head and cervical spine was
performed following the standard protocol without intravenous
contrast. Multiplanar CT image reconstructions of the cervical spine
were also generated.

[Series 3: head without · axial · non-contrast · 0.42mm/px · z∈[-128,-8]mm · 7 of 33 slices shown, 9 images]
[im 5/33  brain]
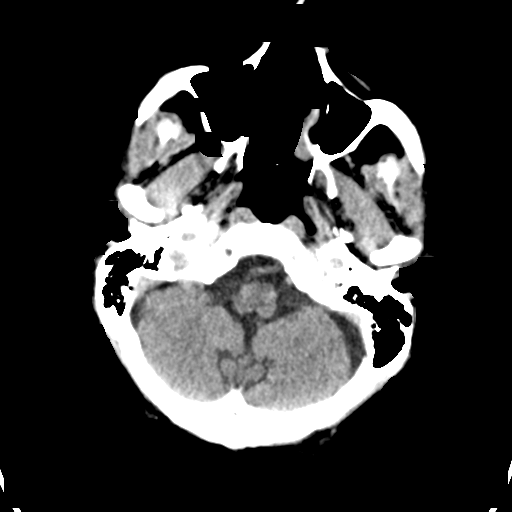
[im 5/33  bone]
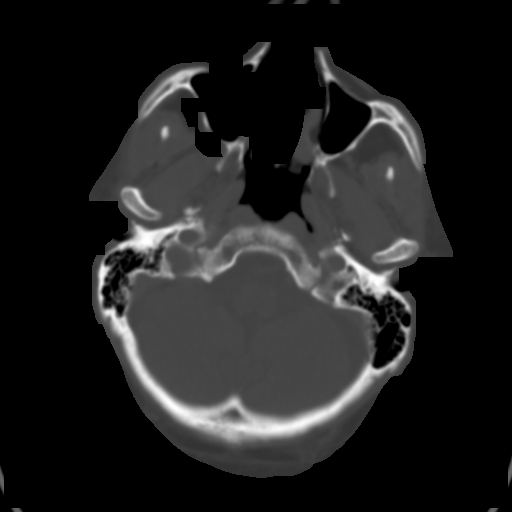
[im 9/33  brain]
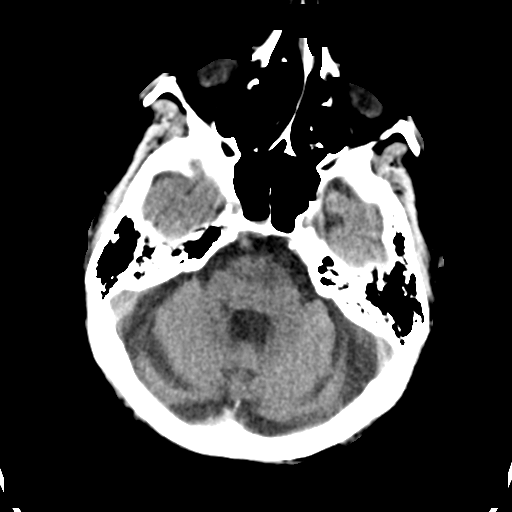
[im 13/33  brain]
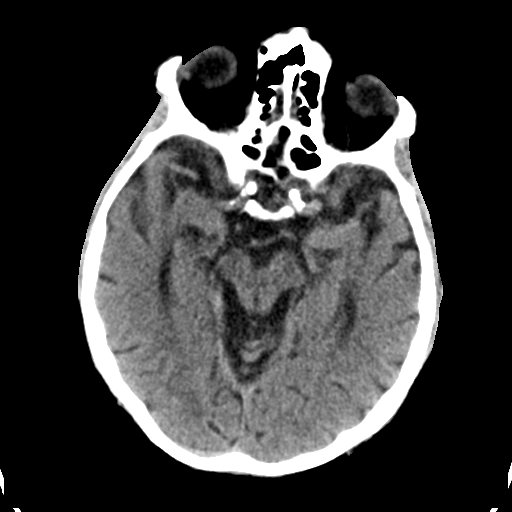
[im 17/33  brain]
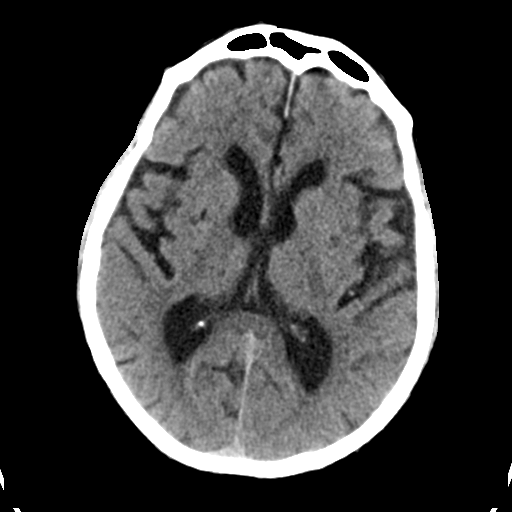
[im 21/33  brain]
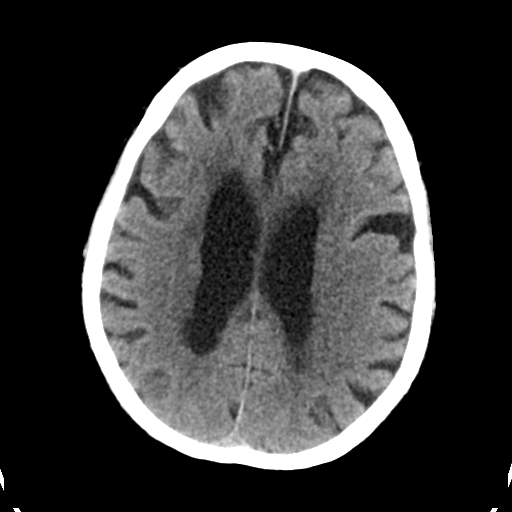
[im 21/33  bone]
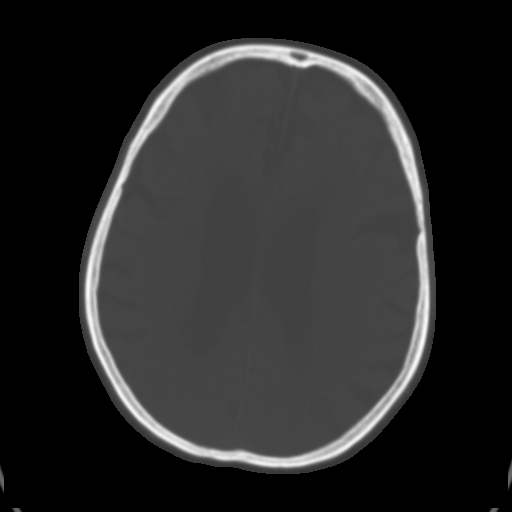
[im 25/33  brain]
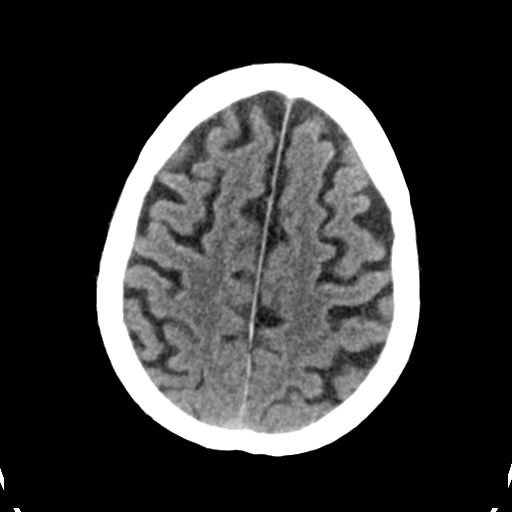
[im 29/33  brain]
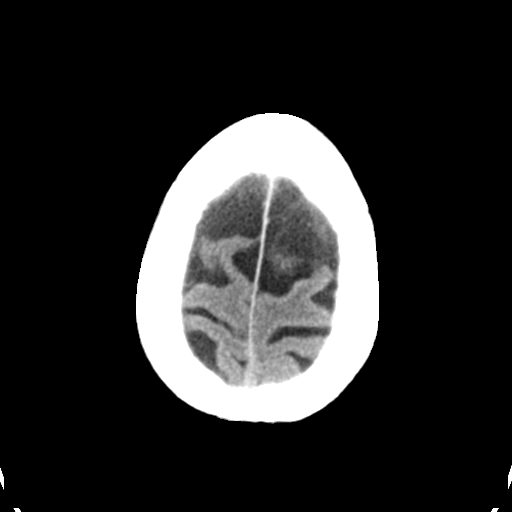

[Series 4: head bone · axial · 0.42mm/px · z∈[-132,-100]mm · 3 of 83 slices shown]
[im 9/83  bone]
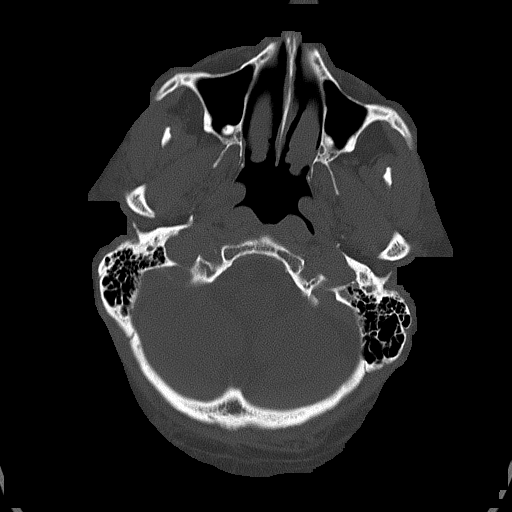
[im 17/83  bone]
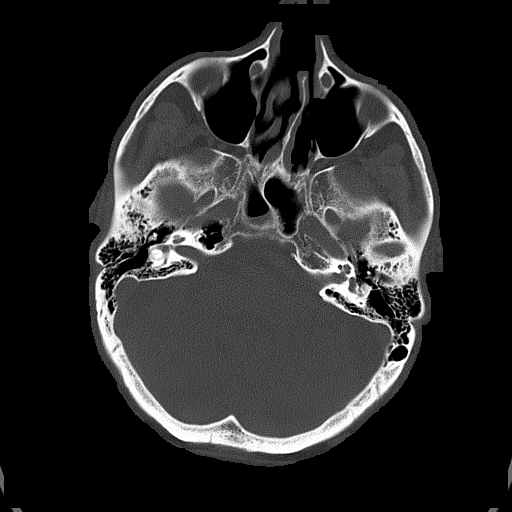
[im 25/83  bone]
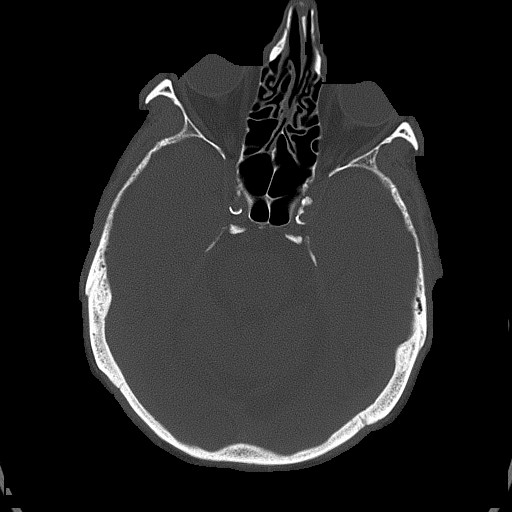

[Series 5: head without cor · coronal · non-contrast · 0.32mm/px · 3 of 67 slices shown]
[im 23/67  brain]
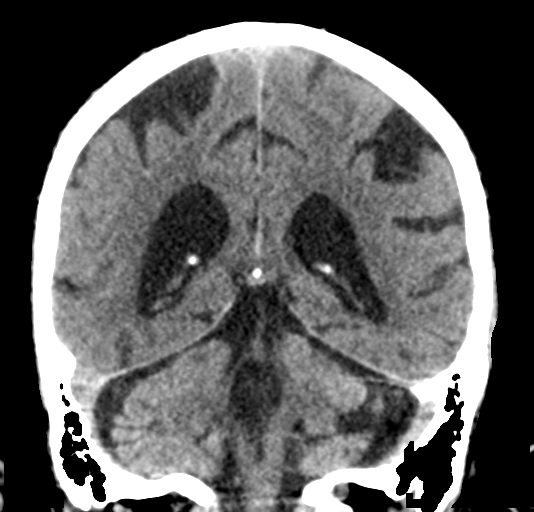
[im 30/67  brain]
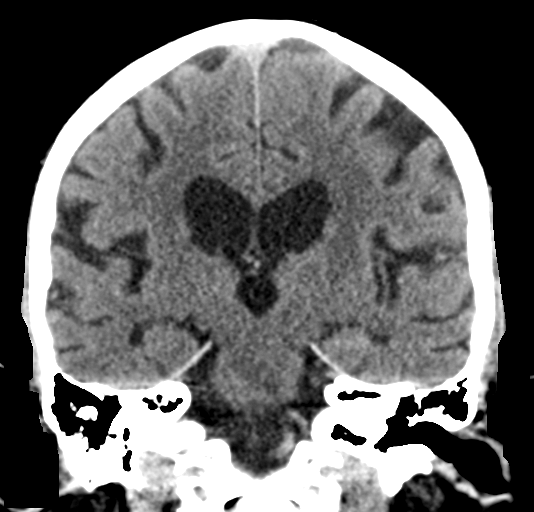
[im 37/67  brain]
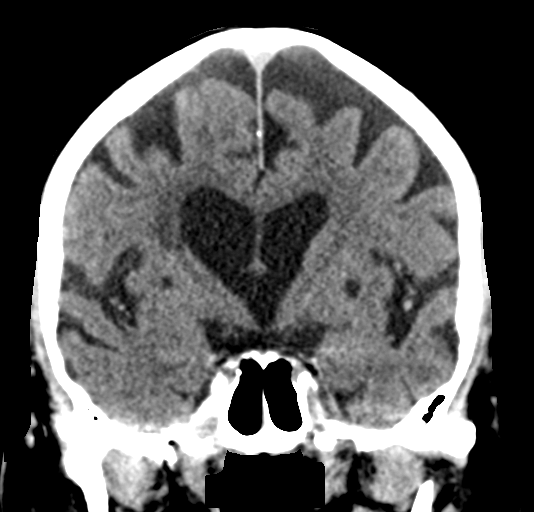

[Series 6: head without sag · sagittal · non-contrast · 0.32mm/px · 3 of 58 slices shown]
[im 20/58  brain]
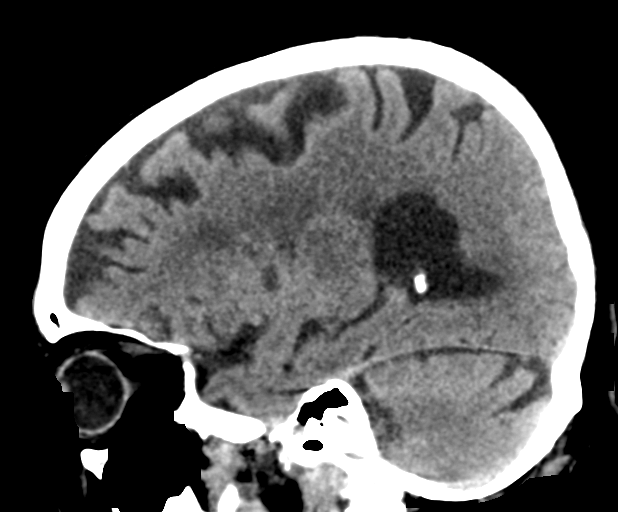
[im 29/58  brain]
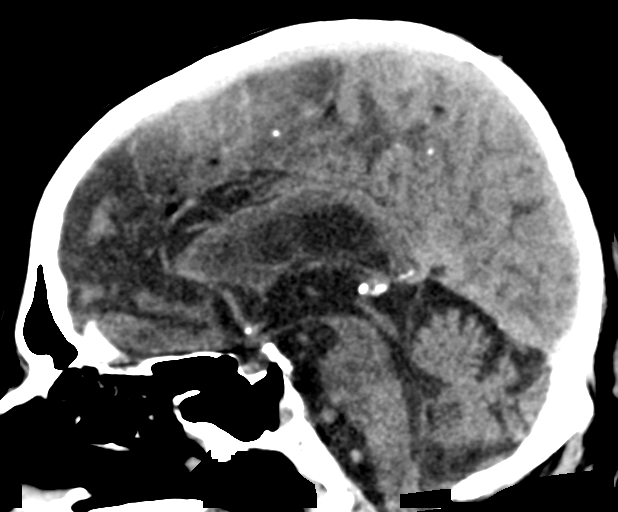
[im 39/58  brain]
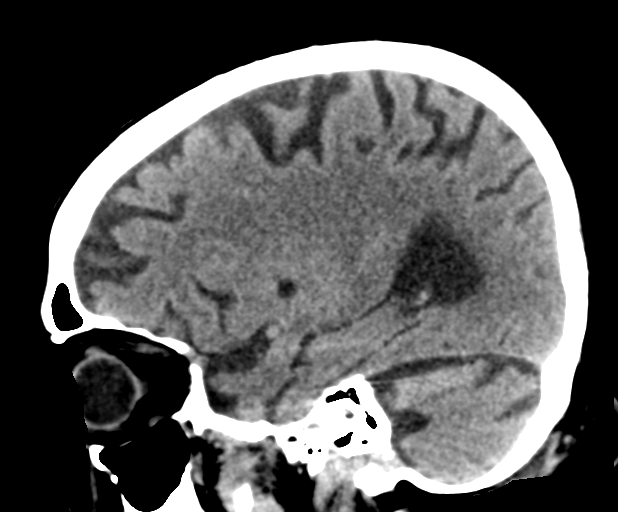

[16 of 47 positions shown; findings below may reference images not displayed]

FINDINGS: CT HEAD FINDINGS

Brain: Mild diffuse cortical atrophy is noted. Mild chronic ischemic
white matter disease is noted. No mass effect or midline shift is
noted. Ventricular size is within normal limits. There is no
evidence of mass lesion, hemorrhage or acute infarction.

Vascular: No hyperdense vessel or unexpected calcification.

Skull: Normal. Negative for fracture or focal lesion.

Sinuses/Orbits: No acute finding.

Other: None.

CT CERVICAL SPINE FINDINGS

Alignment: Normal.

Skull base and vertebrae: No acute fracture. No primary bone lesion
or focal pathologic process.

Soft tissues and spinal canal: No prevertebral fluid or swelling. No
visible canal hematoma.

Disc levels: Moderate degenerative disc disease is noted at C3-4,
C4-5, C5-6 and C6-7.

Upper chest: Negative.

Other: Degenerative changes are seen involving the right-sided
posterior facet joints.
IMPRESSION: No acute intracranial abnormality seen.

Multilevel degenerative disc disease. No acute abnormality seen in
the cervical spine.

## 2020-10-06 IMAGING — DX DG HIP (WITH OR WITHOUT PELVIS) 2-3V*R*
3 series · 3 of 3 positions shown · non-contrast
Comparison: CT pelvis [DATE]

CLINICAL DATA: Sharp low back pain on the right side.  Fall.

EXAM:
DG HIP (WITH OR WITHOUT PELVIS) 2-3V RIGHT

[t pelvis ap]
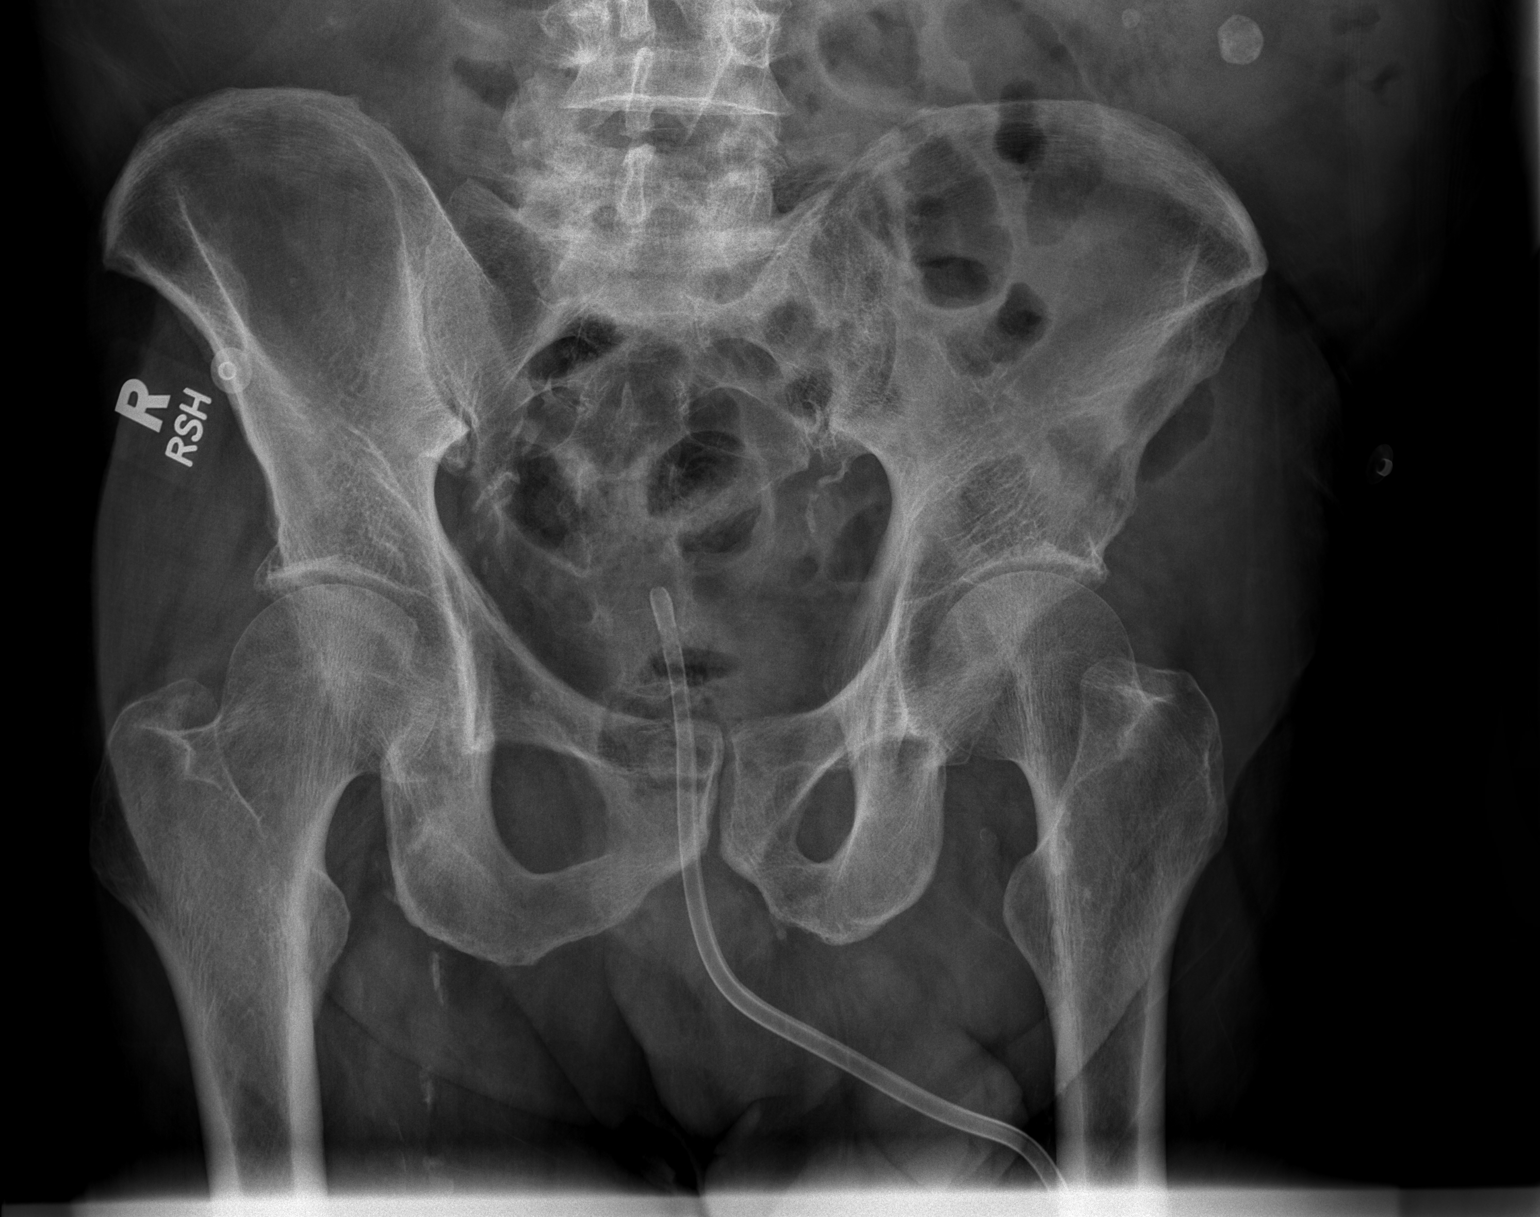

[t hip ap right]
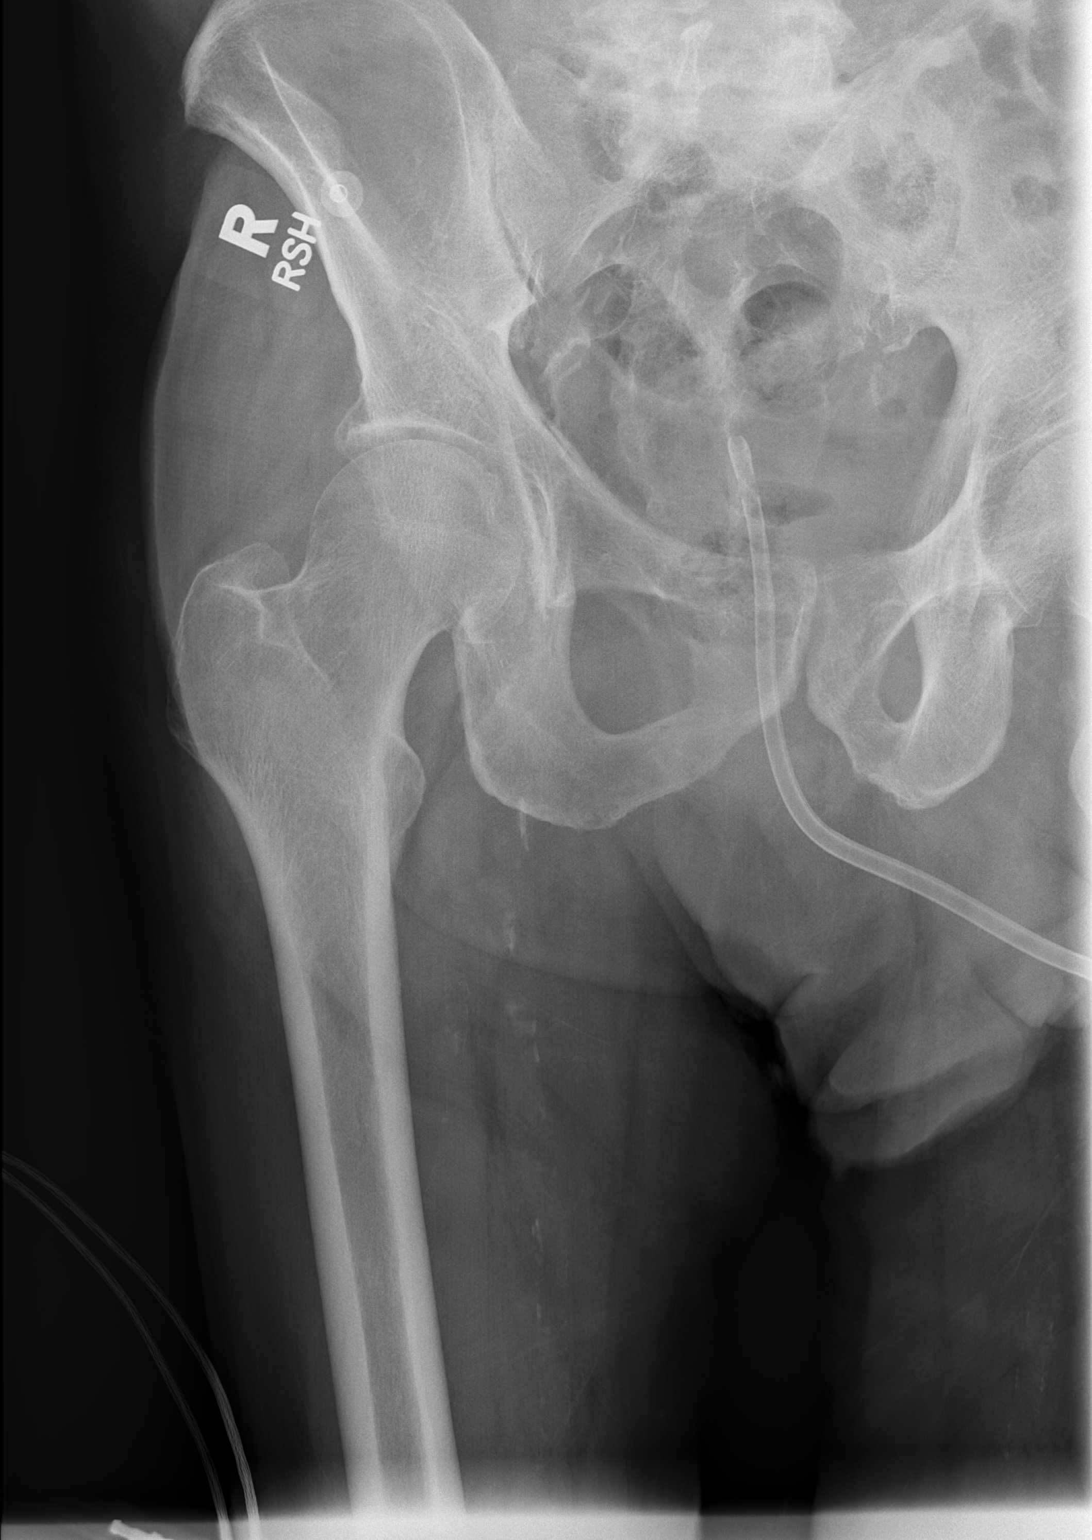

[t hip frog leg right]
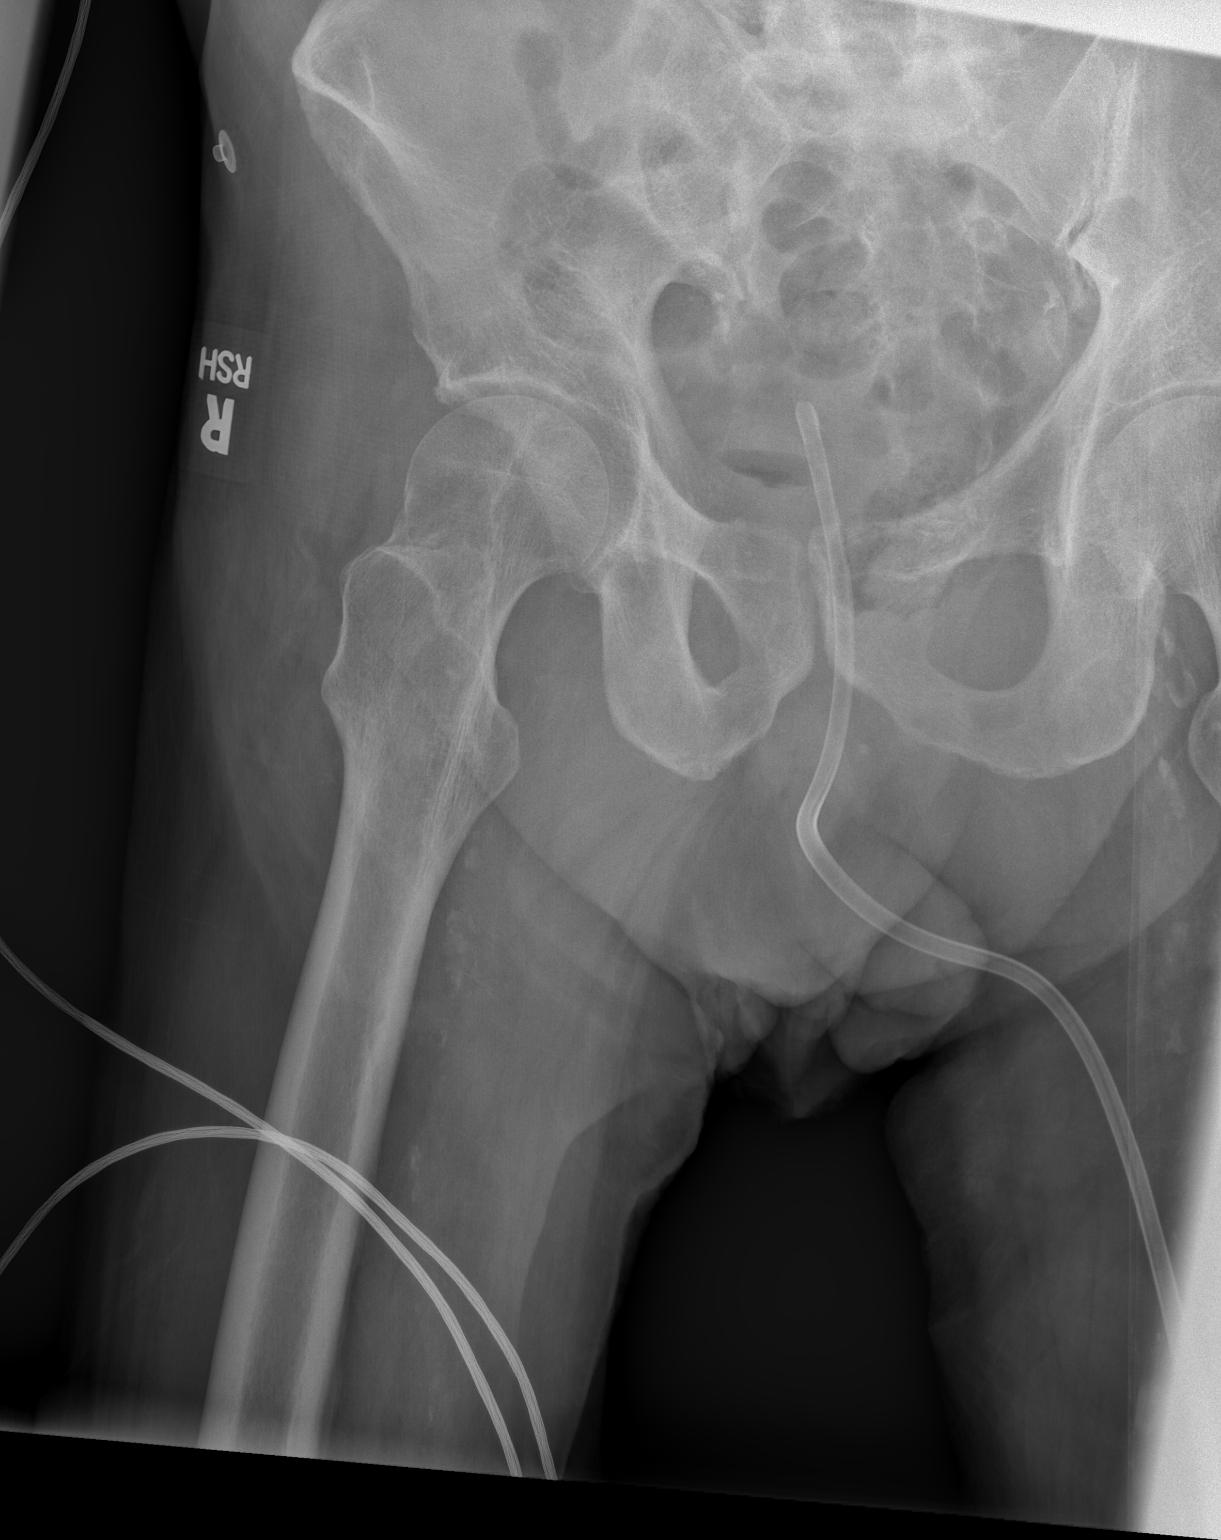

[3 of 3 positions shown; findings below may reference images not displayed]

FINDINGS: A catheter is noted projecting over the central pelvis. Mild
spurring of the SI joints. Lower lumbar spondylosis and degenerative
disc disease. Vascular calcifications.

Moderate craniocaudad and axial loss of articular space in both hips
with mild associated spurring. There is some ridging of the junction
of the right femoral head and neck which is probably degenerative
rather than posttraumatic. I not see a discrete fracture.
IMPRESSION: 1. No fracture identified. If the patient is unable to bear weight
then cross-sectional imaging may be warranted.
2. Lower lumbar spondylosis and degenerative disc disease.
3. Moderate degenerative arthropathy of both hips.

## 2020-10-06 IMAGING — CT CT HIP*R* W/O CM
2 of 3 series · 16 of 46 positions shown, 18 images · non-contrast
Comparison: Right hip x-rays from same day. CT abdomen pelvis dated
[DATE].

CLINICAL DATA: Right hip pain after fall.

EXAM:
CT OF THE RIGHT HIP WITHOUT CONTRAST
TECHNIQUE: Multidetector CT imaging of the right hip was performed according to
the standard protocol. Multiplanar CT image reconstructions were
also generated.

[Series 6: hip 2.0 st · axial · 0.42mm/px · z∈[-965,-679]mm · 13 of 165 slices shown, 15 images]
[im 11/165  soft-tissue]
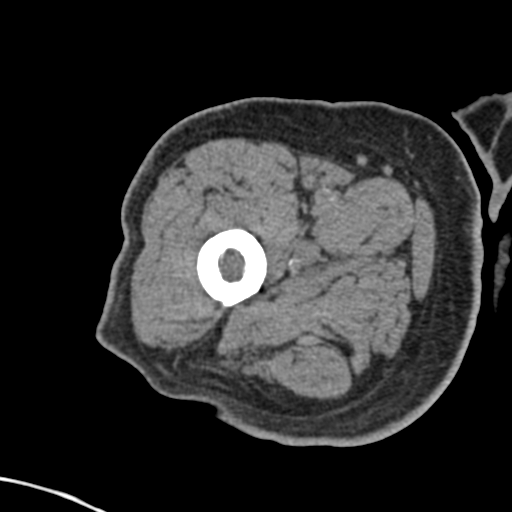
[im 11/165  bone]
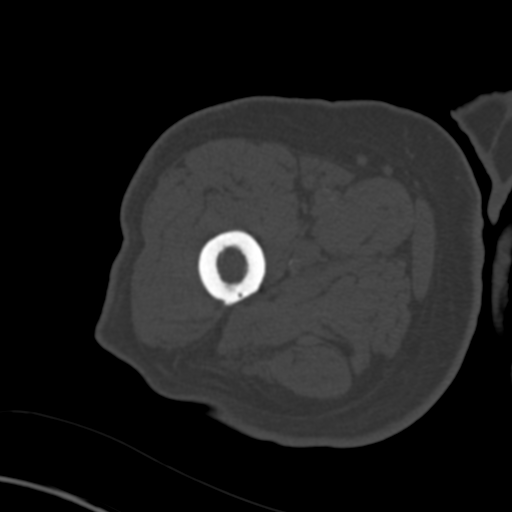
[im 22/165  soft-tissue]
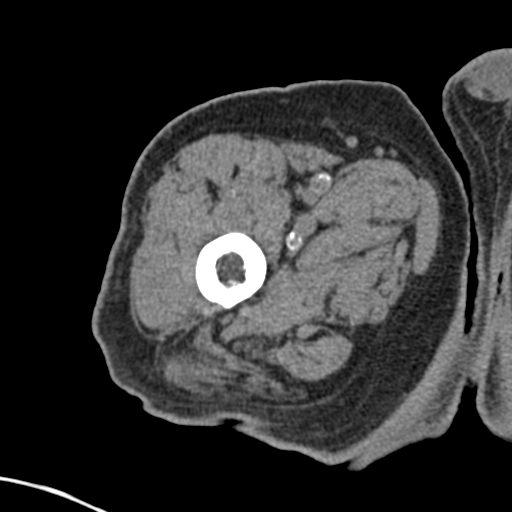
[im 32/165  soft-tissue]
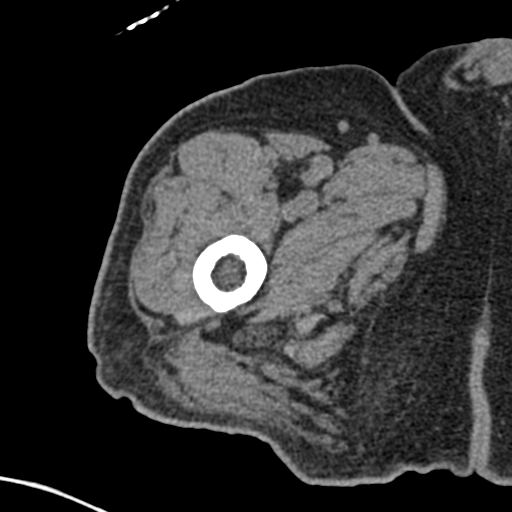
[im 48/165  soft-tissue]
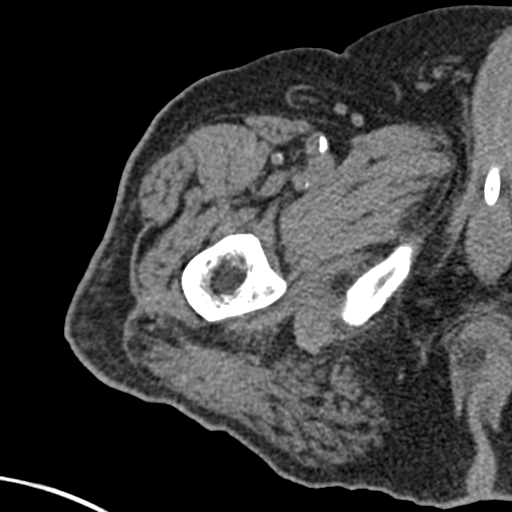
[im 59/165  soft-tissue]
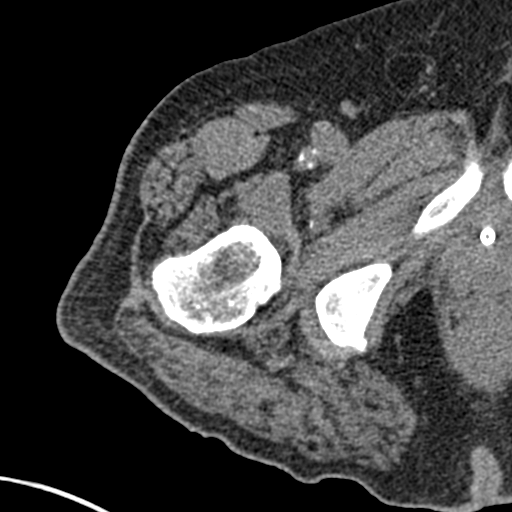
[im 69/165  soft-tissue]
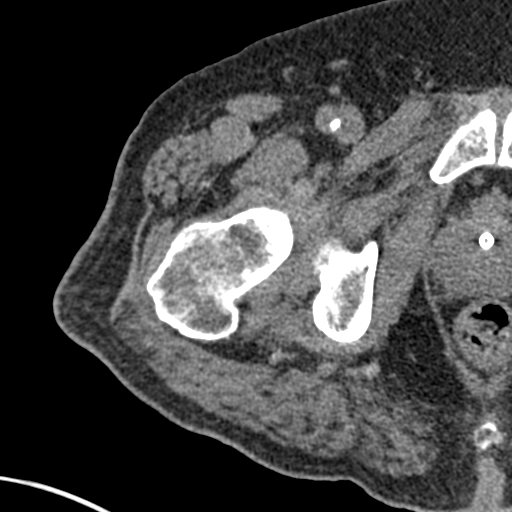
[im 85/165  soft-tissue]
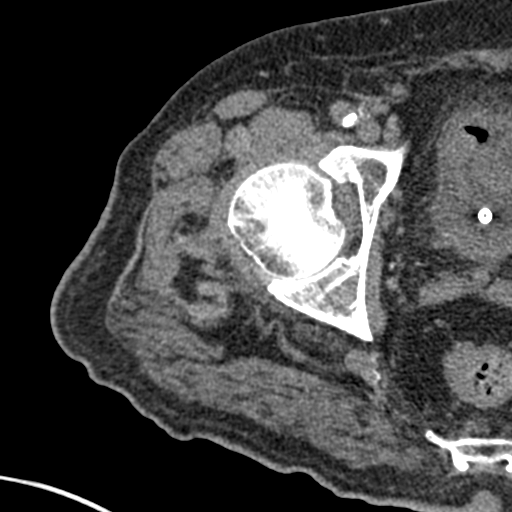
[im 96/165  soft-tissue]
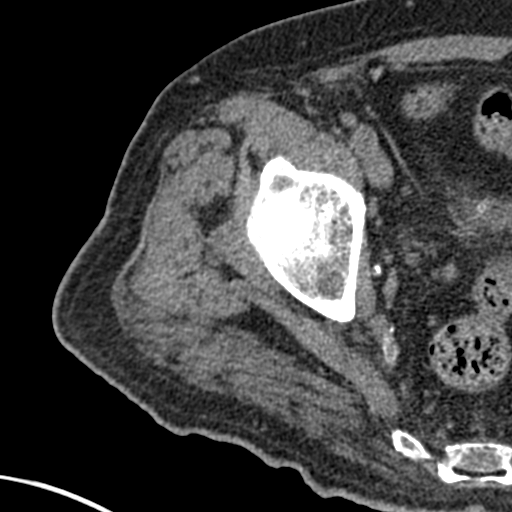
[im 106/165  soft-tissue]
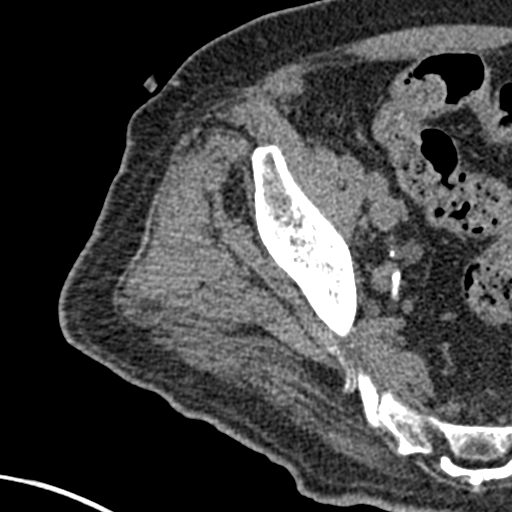
[im 106/165  bone]
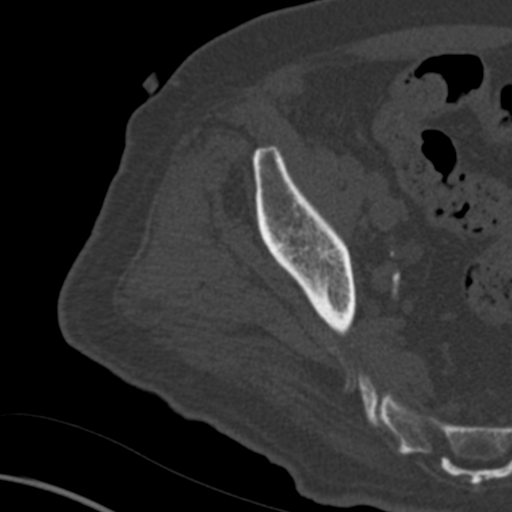
[im 117/165  soft-tissue]
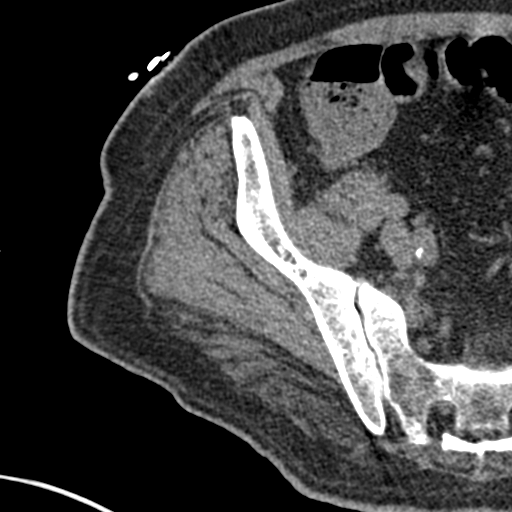
[im 133/165  soft-tissue]
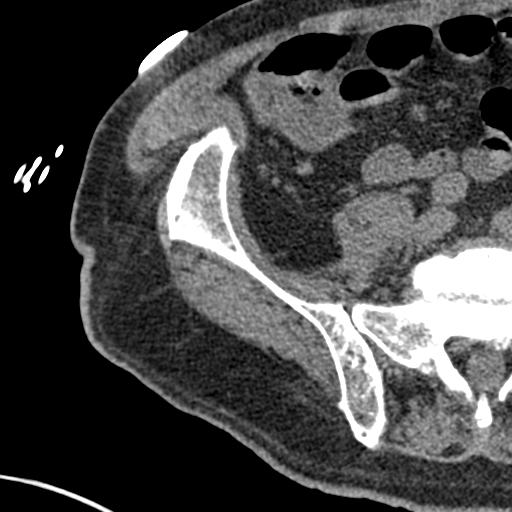
[im 143/165  soft-tissue]
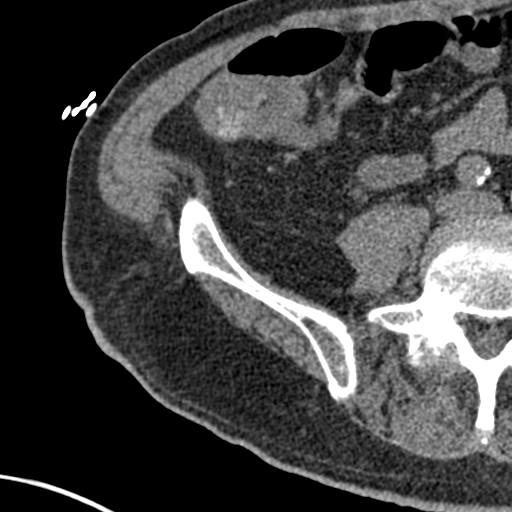
[im 154/165  soft-tissue]
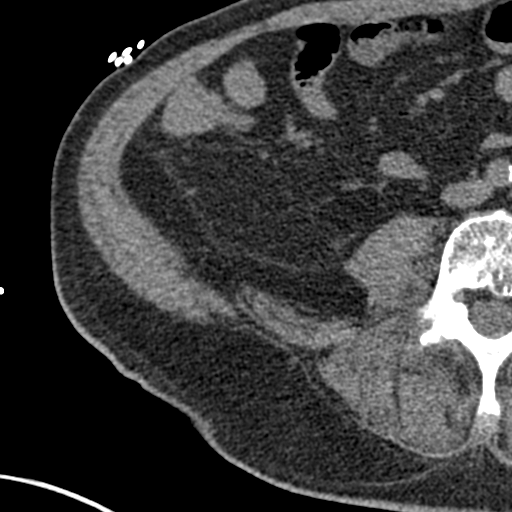

[Series 9: hip 2.0 cor. st · coronal · 0.44mm/px · 3 of 124 slices shown]
[im 42/124  soft-tissue]
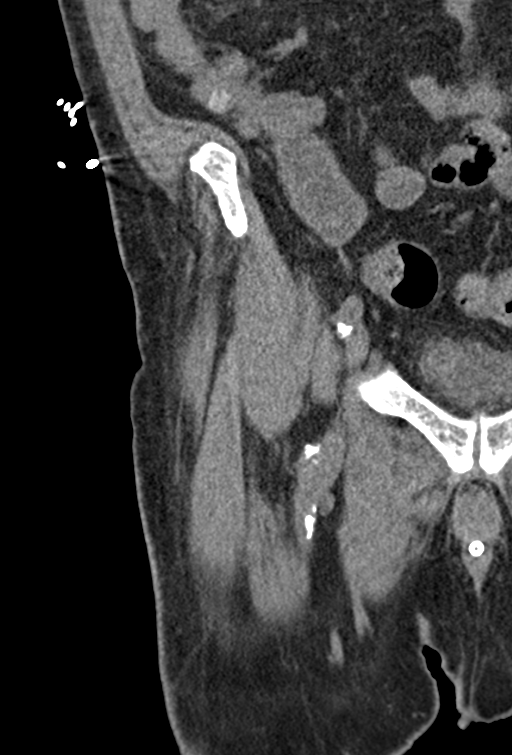
[im 55/124  soft-tissue]
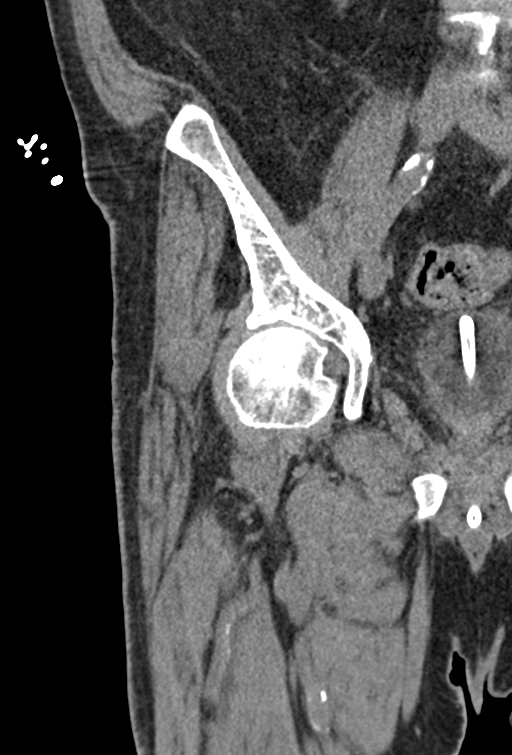
[im 69/124  soft-tissue]
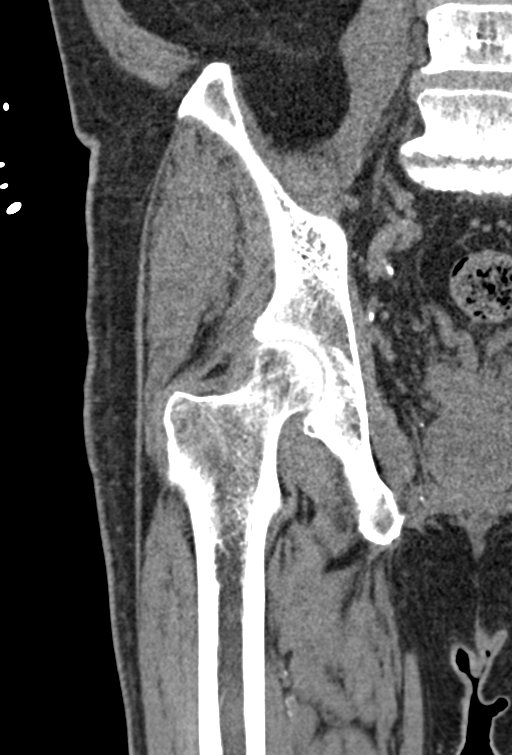

[16 of 46 positions shown; findings below may reference images not displayed]

FINDINGS: Bones/Joint/Cartilage

No fracture or dislocation. Mild right hip osteoarthritis. No joint
effusion.

Ligaments

Ligaments are suboptimally evaluated by CT.

Muscles and Tendons
Grossly intact.

Soft tissue
No fluid collection or hematoma. No soft tissue mass. Foley catheter
in the bladder.
IMPRESSION: 1. No acute osseous abnormality.
2. Mild right hip osteoarthritis.

## 2020-10-06 MED ORDER — ACETAMINOPHEN 500 MG PO TABS
1000.0000 mg | ORAL_TABLET | Freq: Once | ORAL | Status: AC
  Administered 2020-10-06: 1000 mg via ORAL
  Filled 2020-10-06: qty 2

## 2020-10-06 NOTE — Discharge Instructions (Addendum)
No signs of anything broken today on your images.  You do have arthritis in your neck and back which is probably why you are having pain there.  You can use extra strength Tylenol or tramadol as needed for the pain.

## 2020-10-06 NOTE — ED Notes (Signed)
[facility] called  

## 2020-10-06 NOTE — ED Notes (Signed)
Still awaiting [facility] for transport.

## 2020-10-06 NOTE — ED Provider Notes (Signed)
[facility] EMERGENCY DEPARTMENT Provider Note   CSN: [id number] Arrival date & time: 10/06/20  1204     History Chief Complaint  Patient presents with   [name]    Jason Hartman is a 85 y.o. male.  Pt is a 85y/o male with hx of Parkinson disease, DM, CKD, chronic pain, anxiety and and urethral stricture currently with foley cath and d/ced after UTI/polypharmacy/sepsis/encephalopathy on 09/25/20 presenting today from [facility] after a fall from bed.  Patient reports for several weeks now he has been having pain in his neck and back.  Today he was sitting on the edge of his bed and his foot slipped causing him to fall to the floor.  He does not think he hit his head but he does take Eliquis.  He is still complaining of pain in his neck and back but now also having pain in his right hip.  He is denying any chest pain, abdominal pain, shortness of breath.  Everything with the catheter has been okay.  He denies any pain in his knees and reports his right ankle is a little sore but that has been present for a while.  The history is provided by the patient and medical records.  Fall      Past Medical History:  Diagnosis Date   Cellulitis and abscess of leg, except foot    Hypertension    Obstructive sleep apnea (adult) (pediatric)    Pain in joint, pelvic region and thigh    Pneumonia, organism unspecified(486)    Restless legs syndrome (RLS)    RLS (restless legs syndrome) 09/01/2012   Thoracic or lumbosacral neuritis or radiculitis, unspecified    Type II or unspecified type diabetes mellitus without mention of complication, not stated as uncontrolled    Unspecified disease of pericardium    Unspecified hereditary and idiopathic peripheral neuropathy     Patient Active Problem List   Diagnosis Date Noted   AMS (altered mental status)    PAF (paroxysmal atrial fibrillation) (HCC)    Acute metabolic encephalopathy 03/49/1791   Anxiety 09/09/2020   SIRS  (systemic inflammatory response syndrome) ([place]) 09/09/2020   Diaphoresis    Slow transit constipation 08/07/2020   Abdominal aortic aneurysm without rupture ([place]) 03/17/2020   Abnormal gait 03/17/2020   Ascending aorta dilatation ([place]) 03/17/2020   Diabetic renal disease ([place]) 03/17/2020   Recurrent UTI 07/16/2019   Hypertensive urgency 04/26/2019   Malignant HTN with heart disease, w/o CHF, w/o chronic kidney disease 04/26/2019   Elevated troponin    LFTs abnormal    Statin intolerance 01/15/2019   Penis pain 04/10/2018   Urethra cancer ([place]) 02/25/2018   Lower extremity edema 06/09/2017   Peptic ulcer disease 05/16/2017   GERD (gastroesophageal reflux disease) 05/16/2017   Chronic kidney disease, stage 3a ([place]) 04/11/2017   Lower urinary tract symptoms (LUTS) 04/15/2016   Narcotic dependence ([place]) 08/31/2015   Imbalance 08/31/2015   Diabetic peripheral neuropathy ([place]) 08/31/2015   Other malaise and fatigue 05/02/2015   Chronic fatigue 05/02/2015   Panic disorder without agoraphobia 03/22/2015   Hyperlipidemia 02/03/2014   Thrombocytopenia ([place]) 08/18/2013   Parkinsonism ([place]) 08/18/2013   Other disorders of lung 08/18/2013   Organic impotence 08/18/2013   Memory loss 08/18/2013   Low back pain 08/18/2013   Iron deficiency anemia 08/18/2013   Hypercalcemia 08/18/2013   Cellulitis of right leg 08/18/2013   Atherosclerotic heart disease of native coronary artery without angina pectoris 08/18/2013   RLS (  restless legs syndrome) 09/01/2012   HERPES SIMPLEX INFECTION 11/06/2006   Type II diabetes mellitus ([place]) 11/06/2006   HYPOGONADISM 11/06/2006   VITAMIN D DEFICIENCY 11/06/2006   ANXIETY 11/06/2006   OBSTRUCTIVE SLEEP APNEA 11/06/2006   RESTLESS LEG SYNDROME 11/06/2006   HYPERTENSION 11/06/2006   BENIGN PROSTATIC HYPERTROPHY 11/06/2006   ROSACEA 11/06/2006   OSTEOARTHRITIS 11/06/2006   INSOMNIA 11/06/2006   HYPERGLYCEMIA 11/06/2006   COLONIC POLYPS, HX OF 11/06/2006     Past Surgical History:  Procedure Laterality Date   ANAL FISSURE REPAIR     pericarditis  2009       Family History  Problem Relation Age of Onset   Diabetes Father     Social History   Tobacco Use   Smoking status: Former    Pack years: 0.00    Types: Cigarettes    Quit date: 09/01/1980    Years since quitting: 40.1   Smokeless tobacco: Never  Vaping Use   Vaping Use: Never used  Substance Use Topics   Alcohol use: No   Drug use: No    Home Medications Prior to Admission medications   Medication Sig Start Date End Date Taking? Authorizing Provider  acetaminophen (TYLENOL) 500 MG tablet Take 500 mg by mouth every 6 (six) hours as needed for mild pain.    [provider]  allopurinol (ZYLOPRIM) 100 MG tablet Take 2 tablets daily. Patient not taking: No sig reported 02/03/17   Trula Slade, DPM  amiodarone (PACERONE) 200 MG tablet Take 1 tablet (200 mg total) by mouth daily. 09/26/20   Mercy Riding, MD  apixaban (ELIQUIS) 5 MG TABS tablet Take 1 tablet (5 mg total) by mouth 2 (two) times daily. 09/29/20 10/29/20  Mercy Riding, MD  BD PEN NEEDLE NANO 2ND GEN 32G X 4 MM MISC FOR LANTUS AND VICTOZA PENS 11/30/19   [provider]  ferrous sulfate 325 (65 FE) MG EC tablet Take 325 mg by mouth daily.  05/28/18   [provider]  fluticasone (FLONASE) 50 MCG/ACT nasal spray Place 2 sprays into both nostrils daily. 06/21/20   [provider]  gabapentin (NEURONTIN) 300 MG capsule Take 1 capsule (300 mg total) by mouth at bedtime. 09/25/20   Mercy Riding, MD  LANTUS SOLOSTAR 100 UNIT/ML Solostar Pen Inject 8 Units into the skin 2 (two) times daily. 09/25/20   Mercy Riding, MD  metFORMIN (GLUCOPHAGE-XR) 500 MG 24 hr tablet Take 500 mg by mouth daily with breakfast. 01/17/20   [provider]  metoprolol tartrate (LOPRESSOR) 25 MG tablet Take 0.5 tablets (12.5 mg total) by mouth 2 (two) times daily. 09/25/20   Mercy Riding, MD   ofloxacin (OCUFLOX) 0.3 % ophthalmic solution Place 1 drop into both eyes 4 (four) times daily as needed (eye irritation). 03/07/19   [provider]  ondansetron (ZOFRAN-ODT) 4 MG disintegrating tablet Take 4 mg by mouth every 8 (eight) hours as needed for nausea or vomiting. 02/07/20   [provider]  polyethylene glycol powder (GLYCOLAX/MIRALAX) 17 GM/SCOOP powder Take 17 g by mouth daily.    [provider]  Rotigotine (NEUPRO) 8 MG/24HR PT24 Place 1 patch onto the skin every evening.    [provider]  Semaglutide,0.25 or 0.5MG /DOS, (OZEMPIC, 0.25 OR 0.5 MG/DOSE,) 2 MG/1.5ML SOPN Inject 0.5 mg into the skin every Tuesday.    [provider]  senna-docusate (SENOKOT-S) 8.6-50 MG tablet Take 1 tablet by mouth 2 (two) times daily as  needed for moderate constipation. 09/25/20   Mercy Riding, MD  tadalafil (CIALIS) 5 MG tablet Take 5 mg by mouth daily. 04/13/20   [provider]  thiamine 100 MG tablet Take 1 tablet (100 mg total) by mouth daily. 09/26/20   Mercy Riding, MD    Allergies    Oxycodone, Iodinated diagnostic agents, Iodine, Linezolid, Methadone hcl, Promethazine, Morphine sulfate, Other, Oxycodone hcl, Quetiapine, Statins, Zolpidem, Atorvastatin, Carbidopa-levodopa, Chlorhexidine gluconate [chlorhexidine], Clindamycin, Colesevelam, Doxazosin, Lovastatin, and Rosuvastatin  Review of Systems   Review of Systems  All other systems reviewed and are negative.  Physical Exam Updated Vital Signs BP (!) 121/58 (BP Location: Left Arm)   Pulse 78   Temp 98 F (36.7 C) (Oral)   Resp 18   SpO2 96%   Physical Exam Vitals and nursing note reviewed.  Constitutional:      General: He is not in acute distress.    Appearance: He is well-developed.  HENT:     Head: Normocephalic and atraumatic.  Eyes:     Conjunctiva/sclera: Conjunctivae normal.     Pupils: Pupils are equal, round, and reactive to light.  Cardiovascular:     Rate  and Rhythm: Normal rate and regular rhythm.     Pulses: Normal pulses.     Heart sounds: No murmur heard. Pulmonary:     Effort: Pulmonary effort is normal. No respiratory distress.     Breath sounds: Normal breath sounds. No wheezing or rales.  Abdominal:     General: There is no distension.     Palpations: Abdomen is soft.     Tenderness: There is no abdominal tenderness. There is no guarding or rebound.  Genitourinary:    Comments: Foley catheter in place draining clear urine Musculoskeletal:        General: Tenderness present.     Cervical back: Normal range of motion and neck supple. Tenderness present. Spinous process tenderness and muscular tenderness present.     Lumbar back: Tenderness and bony tenderness present.       Back:     Right hip: Tenderness and bony tenderness present. Decreased range of motion.     Left hip: Normal.     Right knee: Normal.     Left knee: Normal.     Right ankle: Normal.     Left ankle: Normal.       Legs:  Skin:    General: Skin is warm and dry.     Findings: No erythema or rash.  Neurological:     Mental Status: He is alert and oriented to person, place, and time. Mental status is at baseline.     Motor: No weakness.  Psychiatric:        Mood and Affect: Mood normal.        Behavior: Behavior normal.    ED Results / Procedures / Treatments   Labs (all labs ordered are listed, but only abnormal results are displayed) Labs Reviewed - No data to display  EKG None  Radiology DG Lumbar Spine Complete  Result Date: 10/06/2020 CLINICAL DATA:  Fall, low back pain on the right side EXAM: LUMBAR SPINE - COMPLETE 4+ VIEW COMPARISON:  09/29/2020 FINDINGS: Vascular calcifications noted. A catheter projects over the lower pelvis. Spurring of the SI joints noted. Degenerative facet arthropathy bilaterally at L4-5 and L5-S1. Prominent space in the facet joints at L4-5 may reflect facet joint effusions. Loss of intervertebral disc height at L5-S1,  similar to prior, with endplate sclerosis  which is likely degenerative. Stable 3 mm degenerative anterolisthesis at L4-5. No acute fracture is identified. Aortoiliac atherosclerotic vascular disease. IMPRESSION: 1. Stable lower lumbar spondylosis and degenerative disc disease. No acute findings. 2. Grade 1 degenerative anterolisthesis at L4-5, stable. 3.  Aortic Atherosclerosis (ICD10-I70.0). Electronically Signed   By: Van Clines M.D.   On: 10/06/2020 13:24   CT Head Wo Contrast  Result Date: 10/06/2020 CLINICAL DATA:  Fall at home today. EXAM: CT HEAD WITHOUT CONTRAST CT CERVICAL SPINE WITHOUT CONTRAST TECHNIQUE: Multidetector CT imaging of the head and cervical spine was performed following the standard protocol without intravenous contrast. Multiplanar CT image reconstructions of the cervical spine were also generated. COMPARISON:  September 29, 2020. FINDINGS: CT HEAD FINDINGS Brain: Mild diffuse cortical atrophy is noted. Mild chronic ischemic white matter disease is noted. No mass effect or midline shift is noted. Ventricular size is within normal limits. There is no evidence of mass lesion, hemorrhage or acute infarction. Vascular: No hyperdense vessel or unexpected calcification. Skull: Normal. Negative for fracture or focal lesion. Sinuses/Orbits: No acute finding. Other: None. CT CERVICAL SPINE FINDINGS Alignment: Normal. Skull base and vertebrae: No acute fracture. No primary bone lesion or focal pathologic process. Soft tissues and spinal canal: No prevertebral fluid or swelling. No visible canal hematoma. Disc levels: Moderate degenerative disc disease is noted at C3-4, C4-5, C5-6 and C6-7. Upper chest: Negative. Other: Degenerative changes are seen involving the right-sided posterior facet joints. IMPRESSION: No acute intracranial abnormality seen. Multilevel degenerative disc disease. No acute abnormality seen in the cervical spine. Electronically Signed   By: Marijo Conception M.D.   On:  10/06/2020 14:41   CT Cervical Spine Wo Contrast  Result Date: 10/06/2020 CLINICAL DATA:  Fall at home today. EXAM: CT HEAD WITHOUT CONTRAST CT CERVICAL SPINE WITHOUT CONTRAST TECHNIQUE: Multidetector CT imaging of the head and cervical spine was performed following the standard protocol without intravenous contrast. Multiplanar CT image reconstructions of the cervical spine were also generated. COMPARISON:  September 29, 2020. FINDINGS: CT HEAD FINDINGS Brain: Mild diffuse cortical atrophy is noted. Mild chronic ischemic white matter disease is noted. No mass effect or midline shift is noted. Ventricular size is within normal limits. There is no evidence of mass lesion, hemorrhage or acute infarction. Vascular: No hyperdense vessel or unexpected calcification. Skull: Normal. Negative for fracture or focal lesion. Sinuses/Orbits: No acute finding. Other: None. CT CERVICAL SPINE FINDINGS Alignment: Normal. Skull base and vertebrae: No acute fracture. No primary bone lesion or focal pathologic process. Soft tissues and spinal canal: No prevertebral fluid or swelling. No visible canal hematoma. Disc levels: Moderate degenerative disc disease is noted at C3-4, C4-5, C5-6 and C6-7. Upper chest: Negative. Other: Degenerative changes are seen involving the right-sided posterior facet joints. IMPRESSION: No acute intracranial abnormality seen. Multilevel degenerative disc disease. No acute abnormality seen in the cervical spine. Electronically Signed   By: Marijo Conception M.D.   On: 10/06/2020 14:41   CT Hip Right Wo Contrast  Result Date: 10/06/2020 CLINICAL DATA:  Right hip pain after fall. EXAM: CT OF THE RIGHT HIP WITHOUT CONTRAST TECHNIQUE: Multidetector CT imaging of the right hip was performed according to the standard protocol. Multiplanar CT image reconstructions were also generated. COMPARISON:  Right hip x-rays from same day. CT abdomen pelvis dated September 14, 2020. FINDINGS: Bones/Joint/Cartilage No fracture or  dislocation. Mild right hip osteoarthritis. No joint effusion. Ligaments Ligaments are suboptimally evaluated by CT. Muscles and Tendons Grossly intact. Soft tissue  No fluid collection or hematoma. No soft tissue mass. Foley catheter in the bladder. IMPRESSION: 1. No acute osseous abnormality. 2. Mild right hip osteoarthritis. Electronically Signed   By: Titus Dubin M.D.   On: 10/06/2020 14:20   DG Hip Unilat W or Wo Pelvis 2-3 Views Right  Result Date: 10/06/2020 CLINICAL DATA:  Hervey Ard low back pain on the right side.  Fall. EXAM: DG HIP (WITH OR WITHOUT PELVIS) 2-3V RIGHT COMPARISON:  CT pelvis 09/14/2020 FINDINGS: A catheter is noted projecting over the central pelvis. Mild spurring of the SI joints. Lower lumbar spondylosis and degenerative disc disease. Vascular calcifications. Moderate craniocaudad and axial loss of articular space in both hips with mild associated spurring. There is some ridging of the junction of the right femoral head and neck which is probably degenerative rather than posttraumatic. I not see a discrete fracture. IMPRESSION: 1. No fracture identified. If the patient is unable to bear weight then cross-sectional imaging may be warranted. 2. Lower lumbar spondylosis and degenerative disc disease. 3. Moderate degenerative arthropathy of both hips. Electronically Signed   By: Van Clines M.D.   On: 10/06/2020 13:27    Procedures Procedures   Medications Ordered in ED Medications  acetaminophen (TYLENOL) tablet 1,000 mg (has no administration in time range)    ED Course  I have reviewed the triage vital signs and the nursing notes.  Pertinent labs & imaging results that were available during my care of the patient were reviewed by me and considered in my medical decision making (see chart for details).    MDM Rules/Calculators/A&P                          Elderly gentleman presenting today as a fall from [place].  Patient reports he was sitting on the  edge of the bed when his foot slipped and he fell to the ground forward on his knees.  Since that time he has been having pain in his right hip but prior to that for the last few weeks he has been having pain in his cervical and lumbar spine.  Patient is neurologically intact.  Pulses are present bilaterally.  Patient has a history of chronic pain and prior thoracic or lumbosacral neuritis/radiculitis.  Suspect the pain in his neck and back is more chronic in nature.  Concern for new injury to the hip since the fall today.  He does not think he hit his head but does take Eliquis and is complaining of neck pain so we will image those areas to ensure no acute pathology.  Patient has extensive allergies to opiates based on prior medical records.  He was given Tylenol for the pain.  Imaging pending.  No evidence to suggest sepsis, cellulitis or other underlying issues today.  2:54 PM Imaging neg for acute issues.  Pt will be d/ced home.  MDM   Amount and/or Complexity of Data Reviewed Tests in the radiology section of CPT: ordered and reviewed Independent visualization of images, tracings, or specimens: yes  Risk of Complications, Morbidity, and/or Mortality Presenting problems: moderate Diagnostic procedures: moderate Management options: moderate  Patient Progress Patient progress: stable   Final Clinical Impression(s) / ED Diagnoses Final diagnoses:  Fall, initial encounter  Lumbar pain    Rx / DC Orders ED Discharge Orders     None        Blanchie Dessert, MD 10/06/20 1513

## 2020-10-06 NOTE — ED Notes (Signed)
Family updated as to patient's status.

## 2020-10-06 NOTE — ED Triage Notes (Signed)
Pt brought to ED via EMS from [facility] following fall. Pt states he was using bathroom and feel forward onto hands and knees. Pt denies hitting head, LOC. Pt on eliquis. Pt currently c/o neck and back pain, per EMS has been going on x 2 weeks. Pt alert to self and situation in ED. Per EMS, history of AMS, diabetes, afib.  EMS v/s: 131/58 78 pulse 95% room air 16 RR

## 2020-10-06 NOTE — ED Notes (Signed)
Patient is resting comfortably. 

## 2020-10-06 NOTE — ED Notes (Signed)
Discharge instructions given by prior nurse. Pt discharge with [facility]. Family at bedside up until departure. Paperwork given to [employer].

## 2020-10-10 DIAGNOSIS — L209 Atopic dermatitis, unspecified: Secondary | ICD-10-CM | POA: Diagnosis not present

## 2020-10-10 DIAGNOSIS — M545 Low back pain, unspecified: Secondary | ICD-10-CM | POA: Diagnosis not present

## 2020-10-10 DIAGNOSIS — F411 Generalized anxiety disorder: Secondary | ICD-10-CM | POA: Diagnosis not present

## 2020-10-10 DIAGNOSIS — F410 Panic disorder [episodic paroxysmal anxiety] without agoraphobia: Secondary | ICD-10-CM | POA: Diagnosis not present

## 2020-10-10 DIAGNOSIS — L299 Pruritus, unspecified: Secondary | ICD-10-CM | POA: Diagnosis not present

## 2020-10-10 DIAGNOSIS — L22 Diaper dermatitis: Secondary | ICD-10-CM | POA: Diagnosis not present

## 2020-10-11 ENCOUNTER — Other Ambulatory Visit: Payer: Self-pay

## 2020-10-11 ENCOUNTER — Emergency Department (HOSPITAL_COMMUNITY): Payer: Medicare Other

## 2020-10-11 ENCOUNTER — Encounter (HOSPITAL_COMMUNITY): Payer: Self-pay | Admitting: Emergency Medicine

## 2020-10-11 ENCOUNTER — Inpatient Hospital Stay (HOSPITAL_COMMUNITY)
Admission: EM | Admit: 2020-10-11 | Discharge: 2020-10-16 | DRG: 698 | Disposition: A | Discharge status: Skilled Nursing Facility | Payer: Medicare Other | Source: Skilled Nursing Facility | Attending: Family Medicine | Admitting: Family Medicine

## 2020-10-11 DIAGNOSIS — R739 Hyperglycemia, unspecified: Secondary | ICD-10-CM | POA: Diagnosis not present

## 2020-10-11 DIAGNOSIS — N179 Acute kidney failure, unspecified: Secondary | ICD-10-CM | POA: Diagnosis present

## 2020-10-11 DIAGNOSIS — E785 Hyperlipidemia, unspecified: Secondary | ICD-10-CM | POA: Diagnosis present

## 2020-10-11 DIAGNOSIS — K5730 Diverticulosis of large intestine without perforation or abscess without bleeding: Secondary | ICD-10-CM | POA: Diagnosis not present

## 2020-10-11 DIAGNOSIS — Z7901 Long term (current) use of anticoagulants: Secondary | ICD-10-CM

## 2020-10-11 DIAGNOSIS — Z7984 Long term (current) use of oral hypoglycemic drugs: Secondary | ICD-10-CM

## 2020-10-11 DIAGNOSIS — G609 Hereditary and idiopathic neuropathy, unspecified: Secondary | ICD-10-CM | POA: Diagnosis present

## 2020-10-11 DIAGNOSIS — M546 Pain in thoracic spine: Secondary | ICD-10-CM | POA: Diagnosis not present

## 2020-10-11 DIAGNOSIS — G2581 Restless legs syndrome: Secondary | ICD-10-CM | POA: Diagnosis present

## 2020-10-11 DIAGNOSIS — W19XXXA Unspecified fall, initial encounter: Secondary | ICD-10-CM

## 2020-10-11 DIAGNOSIS — T83511A Infection and inflammatory reaction due to indwelling urethral catheter, initial encounter: Secondary | ICD-10-CM | POA: Diagnosis not present

## 2020-10-11 DIAGNOSIS — N400 Enlarged prostate without lower urinary tract symptoms: Secondary | ICD-10-CM | POA: Diagnosis present

## 2020-10-11 DIAGNOSIS — M1611 Unilateral primary osteoarthritis, right hip: Secondary | ICD-10-CM | POA: Diagnosis not present

## 2020-10-11 DIAGNOSIS — Z1612 Extended spectrum beta lactamase (ESBL) resistance: Secondary | ICD-10-CM | POA: Diagnosis present

## 2020-10-11 DIAGNOSIS — D509 Iron deficiency anemia, unspecified: Secondary | ICD-10-CM | POA: Diagnosis present

## 2020-10-11 DIAGNOSIS — I480 Paroxysmal atrial fibrillation: Secondary | ICD-10-CM | POA: Diagnosis present

## 2020-10-11 DIAGNOSIS — Z79899 Other long term (current) drug therapy: Secondary | ICD-10-CM

## 2020-10-11 DIAGNOSIS — R1312 Dysphagia, oropharyngeal phase: Secondary | ICD-10-CM | POA: Diagnosis not present

## 2020-10-11 DIAGNOSIS — Z87891 Personal history of nicotine dependence: Secondary | ICD-10-CM

## 2020-10-11 DIAGNOSIS — L89312 Pressure ulcer of right buttock, stage 2: Secondary | ICD-10-CM | POA: Diagnosis present

## 2020-10-11 DIAGNOSIS — E1165 Type 2 diabetes mellitus with hyperglycemia: Secondary | ICD-10-CM | POA: Diagnosis present

## 2020-10-11 DIAGNOSIS — R000 Tachycardia, unspecified: Secondary | ICD-10-CM | POA: Diagnosis not present

## 2020-10-11 DIAGNOSIS — I10 Essential (primary) hypertension: Secondary | ICD-10-CM | POA: Diagnosis not present

## 2020-10-11 DIAGNOSIS — I700 Atherosclerosis of aorta: Secondary | ICD-10-CM | POA: Diagnosis not present

## 2020-10-11 DIAGNOSIS — L22 Diaper dermatitis: Secondary | ICD-10-CM | POA: Diagnosis not present

## 2020-10-11 DIAGNOSIS — A4159 Other Gram-negative sepsis: Secondary | ICD-10-CM | POA: Diagnosis present

## 2020-10-11 DIAGNOSIS — Z833 Family history of diabetes mellitus: Secondary | ICD-10-CM

## 2020-10-11 DIAGNOSIS — I130 Hypertensive heart and chronic kidney disease with heart failure and stage 1 through stage 4 chronic kidney disease, or unspecified chronic kidney disease: Secondary | ICD-10-CM | POA: Diagnosis present

## 2020-10-11 DIAGNOSIS — J9811 Atelectasis: Secondary | ICD-10-CM | POA: Diagnosis not present

## 2020-10-11 DIAGNOSIS — G9340 Encephalopathy, unspecified: Secondary | ICD-10-CM | POA: Diagnosis not present

## 2020-10-11 DIAGNOSIS — R41841 Cognitive communication deficit: Secondary | ICD-10-CM | POA: Diagnosis not present

## 2020-10-11 DIAGNOSIS — E1122 Type 2 diabetes mellitus with diabetic chronic kidney disease: Secondary | ICD-10-CM | POA: Diagnosis present

## 2020-10-11 DIAGNOSIS — M549 Dorsalgia, unspecified: Secondary | ICD-10-CM | POA: Diagnosis not present

## 2020-10-11 DIAGNOSIS — R4182 Altered mental status, unspecified: Secondary | ICD-10-CM | POA: Diagnosis not present

## 2020-10-11 DIAGNOSIS — I2510 Atherosclerotic heart disease of native coronary artery without angina pectoris: Secondary | ICD-10-CM | POA: Diagnosis present

## 2020-10-11 DIAGNOSIS — N3000 Acute cystitis without hematuria: Secondary | ICD-10-CM

## 2020-10-11 DIAGNOSIS — E860 Dehydration: Secondary | ICD-10-CM | POA: Diagnosis present

## 2020-10-11 DIAGNOSIS — Z043 Encounter for examination and observation following other accident: Secondary | ICD-10-CM | POA: Diagnosis not present

## 2020-10-11 DIAGNOSIS — N1832 Chronic kidney disease, stage 3b: Secondary | ICD-10-CM | POA: Diagnosis present

## 2020-10-11 DIAGNOSIS — I6523 Occlusion and stenosis of bilateral carotid arteries: Secondary | ICD-10-CM | POA: Diagnosis not present

## 2020-10-11 DIAGNOSIS — Z7401 Bed confinement status: Secondary | ICD-10-CM | POA: Diagnosis not present

## 2020-10-11 DIAGNOSIS — K219 Gastro-esophageal reflux disease without esophagitis: Secondary | ICD-10-CM | POA: Diagnosis present

## 2020-10-11 DIAGNOSIS — Y846 Urinary catheterization as the cause of abnormal reaction of the patient, or of later complication, without mention of misadventure at the time of the procedure: Secondary | ICD-10-CM | POA: Diagnosis present

## 2020-10-11 DIAGNOSIS — I5032 Chronic diastolic (congestive) heart failure: Secondary | ICD-10-CM | POA: Diagnosis present

## 2020-10-11 DIAGNOSIS — I739 Peripheral vascular disease, unspecified: Secondary | ICD-10-CM | POA: Diagnosis not present

## 2020-10-11 DIAGNOSIS — U071 COVID-19: Secondary | ICD-10-CM | POA: Diagnosis not present

## 2020-10-11 DIAGNOSIS — F0391 Unspecified dementia with behavioral disturbance: Secondary | ICD-10-CM | POA: Diagnosis not present

## 2020-10-11 DIAGNOSIS — F419 Anxiety disorder, unspecified: Secondary | ICD-10-CM | POA: Diagnosis not present

## 2020-10-11 DIAGNOSIS — F410 Panic disorder [episodic paroxysmal anxiety] without agoraphobia: Secondary | ICD-10-CM | POA: Diagnosis not present

## 2020-10-11 DIAGNOSIS — Z20822 Contact with and (suspected) exposure to covid-19: Secondary | ICD-10-CM | POA: Diagnosis present

## 2020-10-11 DIAGNOSIS — L209 Atopic dermatitis, unspecified: Secondary | ICD-10-CM | POA: Diagnosis not present

## 2020-10-11 DIAGNOSIS — G4733 Obstructive sleep apnea (adult) (pediatric): Secondary | ICD-10-CM | POA: Diagnosis present

## 2020-10-11 DIAGNOSIS — N390 Urinary tract infection, site not specified: Secondary | ICD-10-CM | POA: Diagnosis present

## 2020-10-11 DIAGNOSIS — S0990XA Unspecified injury of head, initial encounter: Secondary | ICD-10-CM | POA: Diagnosis not present

## 2020-10-11 DIAGNOSIS — M25562 Pain in left knee: Secondary | ICD-10-CM | POA: Diagnosis not present

## 2020-10-11 DIAGNOSIS — G9341 Metabolic encephalopathy: Secondary | ICD-10-CM | POA: Diagnosis present

## 2020-10-11 DIAGNOSIS — M6281 Muscle weakness (generalized): Secondary | ICD-10-CM | POA: Diagnosis not present

## 2020-10-11 DIAGNOSIS — R6520 Severe sepsis without septic shock: Secondary | ICD-10-CM | POA: Diagnosis present

## 2020-10-11 DIAGNOSIS — N35919 Unspecified urethral stricture, male, unspecified site: Secondary | ICD-10-CM | POA: Diagnosis present

## 2020-10-11 DIAGNOSIS — G20 Parkinson's disease: Secondary | ICD-10-CM | POA: Diagnosis present

## 2020-10-11 DIAGNOSIS — I672 Cerebral atherosclerosis: Secondary | ICD-10-CM | POA: Diagnosis not present

## 2020-10-11 DIAGNOSIS — Z888 Allergy status to other drugs, medicaments and biological substances status: Secondary | ICD-10-CM

## 2020-10-11 DIAGNOSIS — R278 Other lack of coordination: Secondary | ICD-10-CM | POA: Diagnosis not present

## 2020-10-11 DIAGNOSIS — G894 Chronic pain syndrome: Secondary | ICD-10-CM | POA: Diagnosis present

## 2020-10-11 DIAGNOSIS — R2681 Unsteadiness on feet: Secondary | ICD-10-CM | POA: Diagnosis not present

## 2020-10-11 DIAGNOSIS — M545 Low back pain, unspecified: Secondary | ICD-10-CM | POA: Diagnosis not present

## 2020-10-11 DIAGNOSIS — A419 Sepsis, unspecified organism: Secondary | ICD-10-CM | POA: Diagnosis present

## 2020-10-11 DIAGNOSIS — R5381 Other malaise: Secondary | ICD-10-CM | POA: Diagnosis not present

## 2020-10-11 DIAGNOSIS — I4892 Unspecified atrial flutter: Secondary | ICD-10-CM | POA: Diagnosis present

## 2020-10-11 DIAGNOSIS — R410 Disorientation, unspecified: Secondary | ICD-10-CM | POA: Diagnosis not present

## 2020-10-11 DIAGNOSIS — Z885 Allergy status to narcotic agent status: Secondary | ICD-10-CM

## 2020-10-11 DIAGNOSIS — R404 Transient alteration of awareness: Secondary | ICD-10-CM | POA: Diagnosis not present

## 2020-10-11 DIAGNOSIS — F411 Generalized anxiety disorder: Secondary | ICD-10-CM | POA: Diagnosis not present

## 2020-10-11 DIAGNOSIS — R319 Hematuria, unspecified: Secondary | ICD-10-CM | POA: Diagnosis not present

## 2020-10-11 DIAGNOSIS — Z91041 Radiographic dye allergy status: Secondary | ICD-10-CM

## 2020-10-11 DIAGNOSIS — N200 Calculus of kidney: Secondary | ICD-10-CM | POA: Diagnosis not present

## 2020-10-11 DIAGNOSIS — M25561 Pain in right knee: Secondary | ICD-10-CM | POA: Diagnosis not present

## 2020-10-11 DIAGNOSIS — M1990 Unspecified osteoarthritis, unspecified site: Secondary | ICD-10-CM | POA: Diagnosis not present

## 2020-10-11 DIAGNOSIS — Z881 Allergy status to other antibiotic agents status: Secondary | ICD-10-CM

## 2020-10-11 LAB — CBC
HCT: 53 % — ABNORMAL HIGH (ref 39.0–52.0)
Hemoglobin: 17.4 g/dL — ABNORMAL HIGH (ref 13.0–17.0)
MCH: 28.4 pg (ref 26.0–34.0)
MCHC: 32.8 g/dL (ref 30.0–36.0)
MCV: 86.6 fL (ref 80.0–100.0)
Platelets: 251 10*3/uL (ref 150–400)
RBC: 6.12 MIL/uL — ABNORMAL HIGH (ref 4.22–5.81)
RDW: 14.5 % (ref 11.5–15.5)
WBC: 11.3 10*3/uL — ABNORMAL HIGH (ref 4.0–10.5)
nRBC: 0 % (ref 0.0–0.2)

## 2020-10-11 LAB — BASIC METABOLIC PANEL
Anion gap: 16 — ABNORMAL HIGH (ref 5–15)
BUN: 22 mg/dL (ref 8–23)
CO2: 20 mmol/L — ABNORMAL LOW (ref 22–32)
Calcium: 9.8 mg/dL (ref 8.9–10.3)
Chloride: 99 mmol/L (ref 98–111)
Creatinine, Ser: 1.47 mg/dL — ABNORMAL HIGH (ref 0.61–1.24)
GFR, Estimated: 47 mL/min — ABNORMAL LOW (ref >60)
Glucose, Bld: 304 mg/dL — ABNORMAL HIGH (ref 70–99)
Potassium: 4.7 mmol/L (ref 3.5–5.1)
Sodium: 135 mmol/L (ref 135–145)

## 2020-10-11 LAB — URINALYSIS, ROUTINE W REFLEX MICROSCOPIC
Bilirubin Urine: NEGATIVE
Glucose, UA: >=500 mg/dL — AB
Ketones, ur: NEGATIVE mg/dL
Nitrite: NEGATIVE
Protein, ur: 100 mg/dL — AB
RBC / HPF: >50 RBC/hpf — ABNORMAL HIGH (ref 0–5)
Specific Gravity, Urine: 1.011 (ref 1.005–1.030)
WBC, UA: >50 WBC/hpf — ABNORMAL HIGH (ref 0–5)
pH: 5 (ref 5.0–8.0)

## 2020-10-11 LAB — CBG MONITORING, ED: Glucose-Capillary: 332 mg/dL — ABNORMAL HIGH (ref 70–99)

## 2020-10-11 IMAGING — DX DG KNEE 1-2V*L*
2 series · 2 of 2 positions shown · non-contrast
Comparison: None.

CLINICAL DATA: Fall, bilateral knee pain.

EXAM:
LEFT KNEE - 1-2 VIEW

[knee ap]
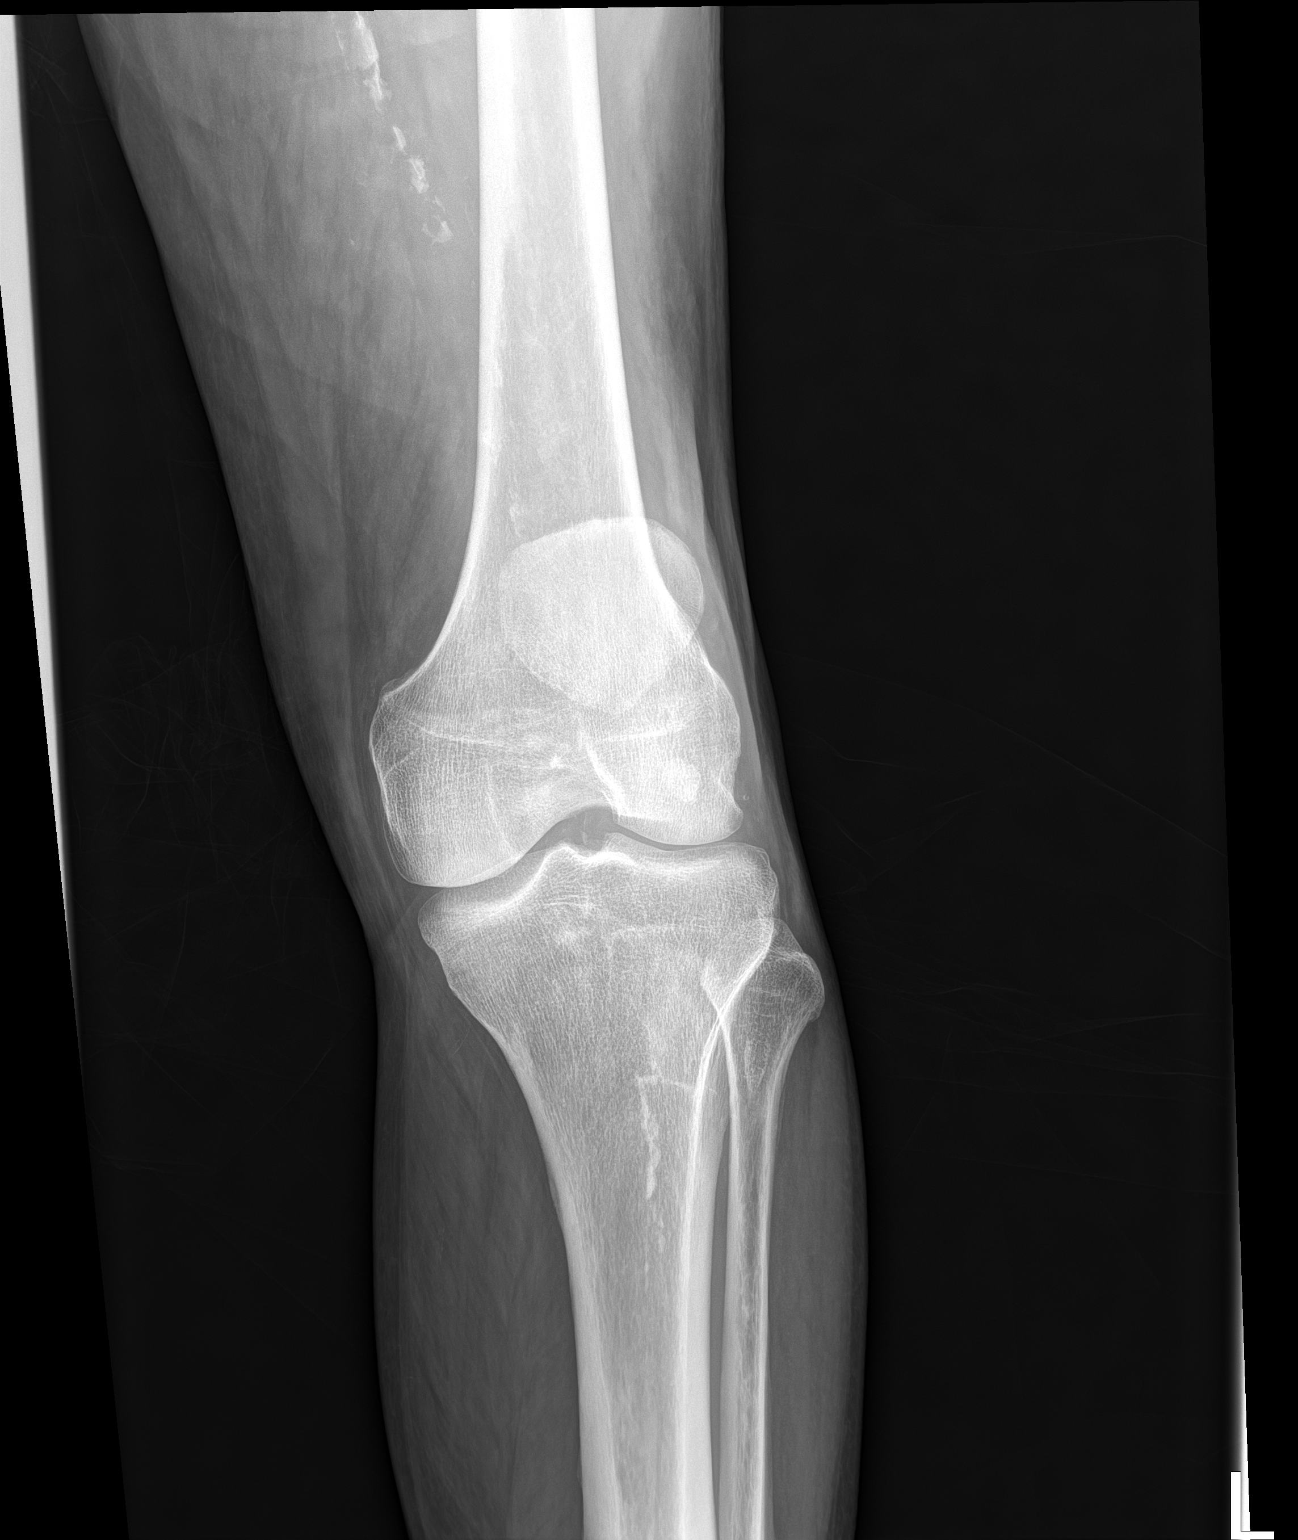

[knee lat]
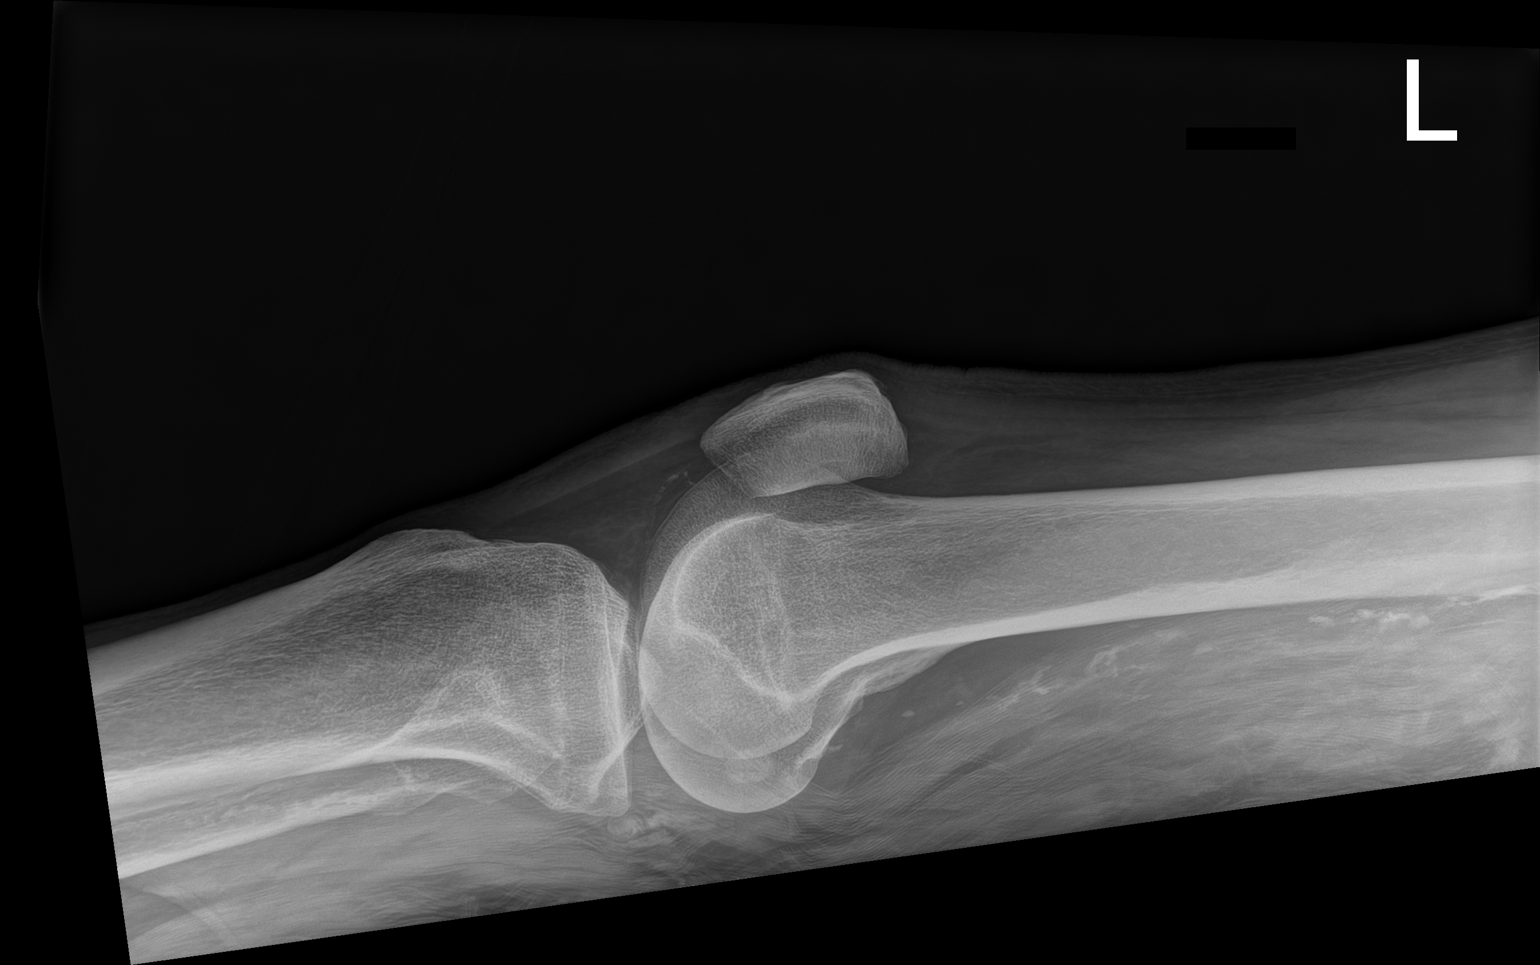

[2 of 2 positions shown; findings below may reference images not displayed]

FINDINGS: No evidence of fracture, dislocation, or joint effusion. Normal
joint spaces. Advanced vascular calcifications. Soft tissues are
unremarkable.
IMPRESSION: 1. No fracture or subluxation of the left knee.
2. Advanced vascular calcifications.

## 2020-10-11 IMAGING — CT CT CERVICAL SPINE W/O CM
4 of 8 series · 10 of 33 positions shown, 11 images · non-contrast
Comparison: [DATE] head CT and cervical spine CT

CLINICAL DATA: Fall

EXAM:
CT HEAD WITHOUT CONTRAST
CT CERVICAL SPINE WITHOUT CONTRAST
TECHNIQUE: Multidetector CT imaging of the head and cervical spine was
performed following the standard protocol without intravenous
contrast. Multiplanar CT image reconstructions of the cervical spine
were also generated.

[Series 9: c_spine 2.0 sag bone · sagittal · 0.30mm/px · 5 of 61 slices shown]
[im 11/61  bone]
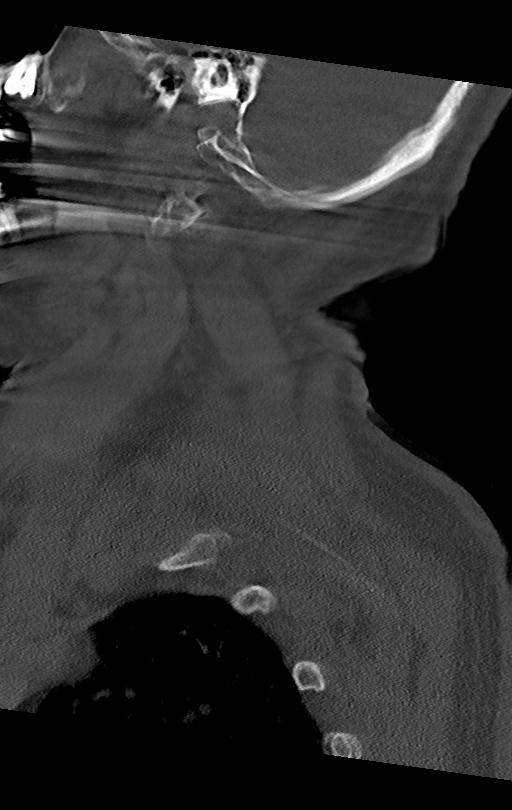
[im 21/61  bone]
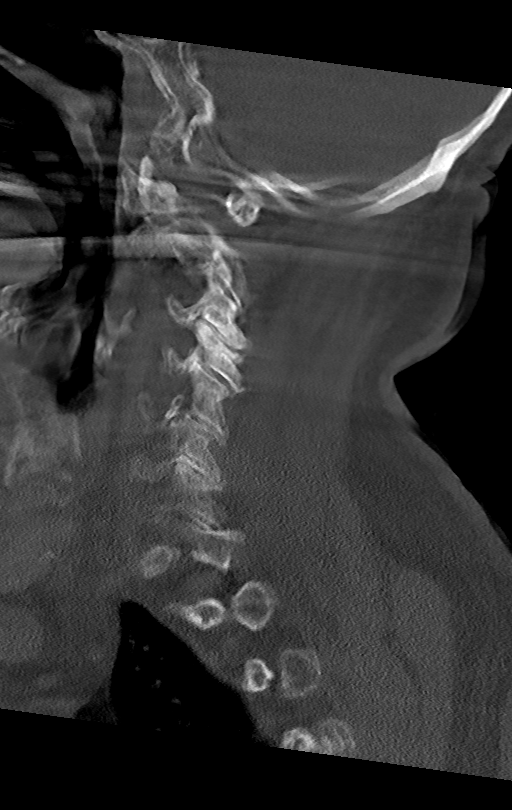
[im 31/61  bone]
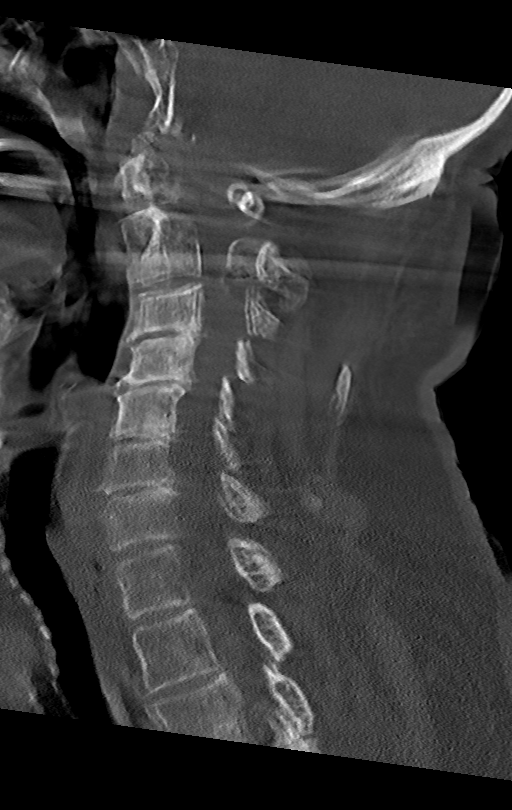
[im 41/61  bone]
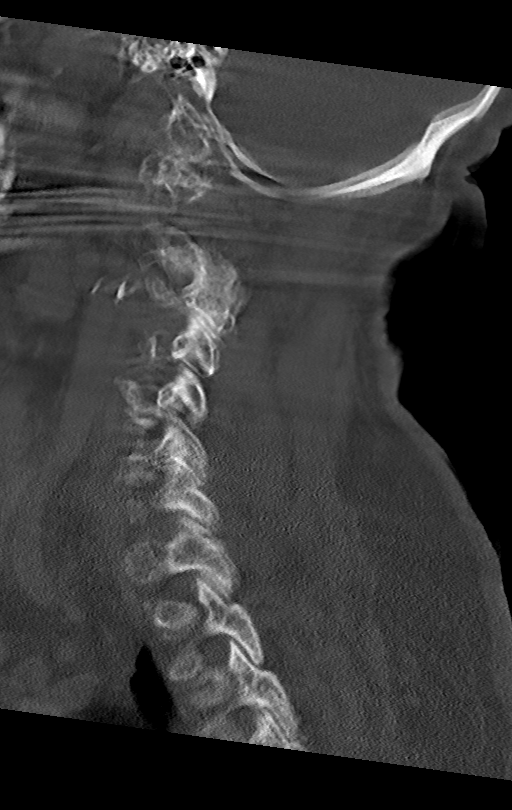
[im 51/61  bone]
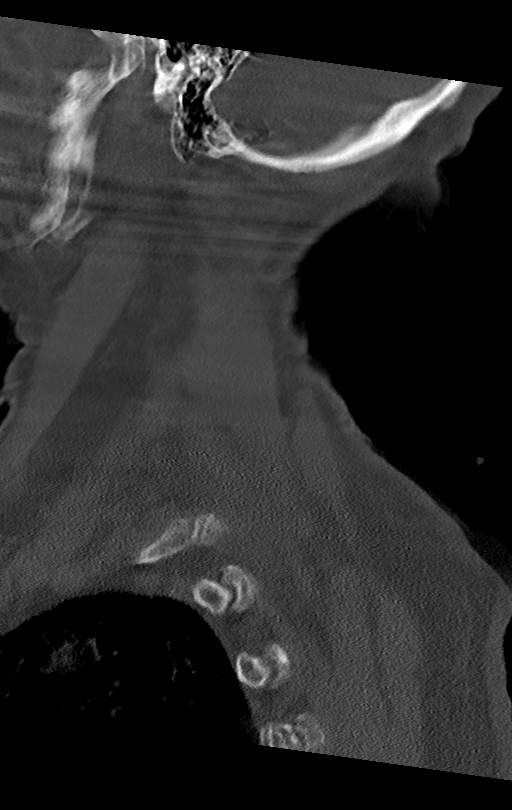

[Series 10: c_spine 2.0 cor bone · coronal · 0.30mm/px · 1 of 61 slices shown]
[im 31/61  bone]
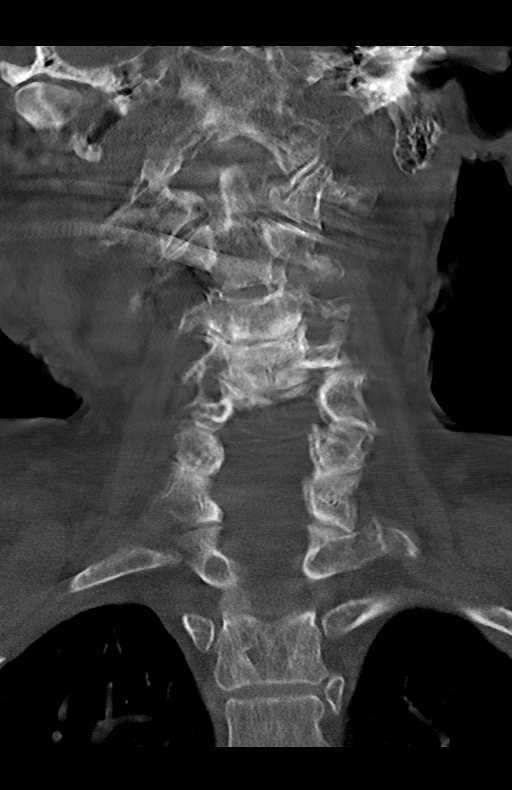

[Series 11: c_spine 2.0 orthogonals · axial · 0.21mm/px · z∈[-91,-37]mm · 2 of 100 slices shown]
[im 34/100  bone]
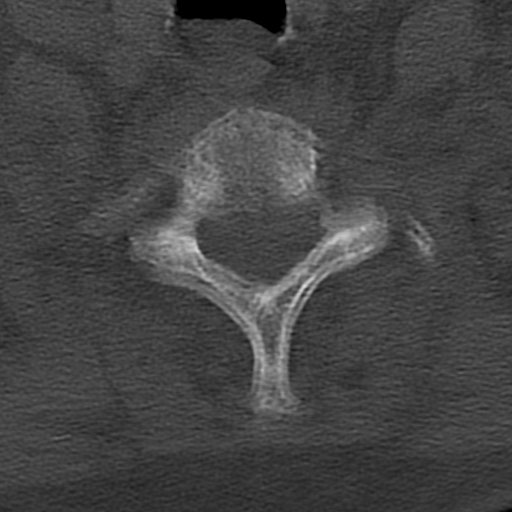
[im 67/100  bone]
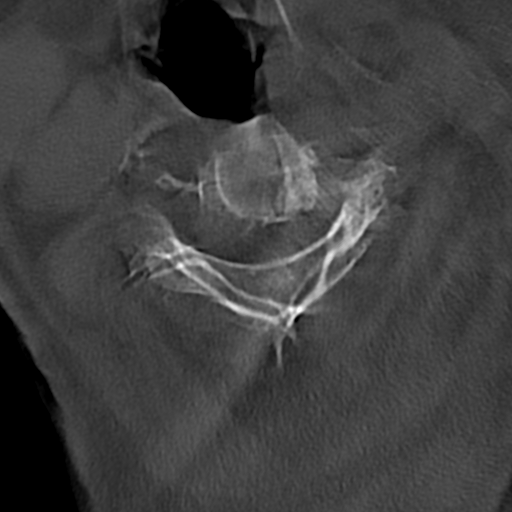

[Series 12: c_spine 2.0 st · axial · 0.32mm/px · z∈[-73,-5]mm · 2 of 102 slices shown, 3 images]
[im 34/102  soft-tissue]
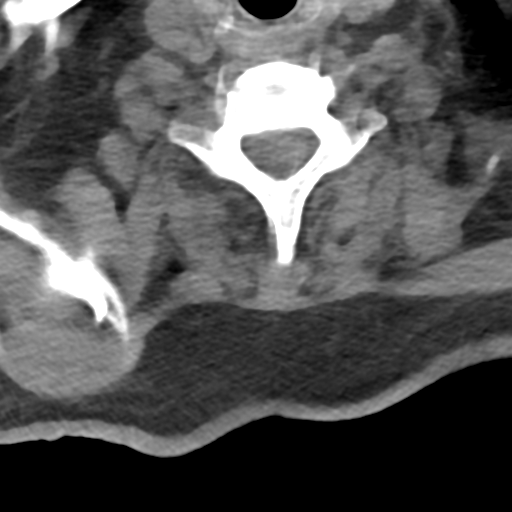
[im 34/102  bone]
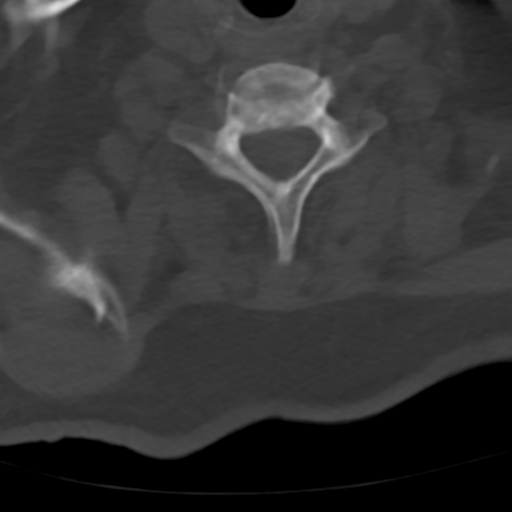
[im 68/102  bone]
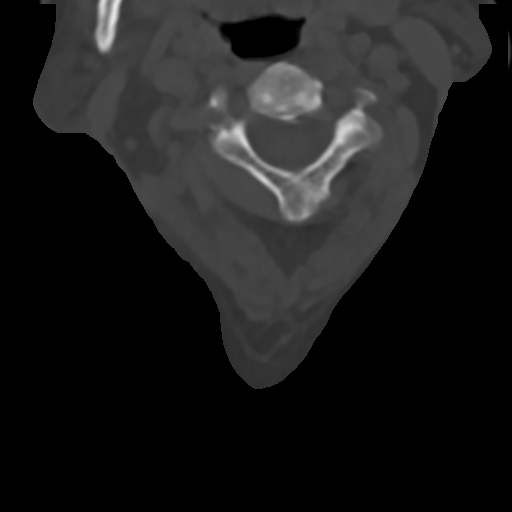

[10 of 33 positions shown; findings below may reference images not displayed]

FINDINGS: CT HEAD FINDINGS

Brain: There is no mass, hemorrhage or extra-axial collection. There
is generalized atrophy without lobar predilection. There is
hypoattenuation of the periventricular white matter, most commonly
indicating chronic ischemic microangiopathy.

Vascular: Atherosclerotic calcification of the internal carotid
arteries at the skull base. No abnormal hyperdensity of the major
intracranial arteries or dural venous sinuses.

Skull: The visualized skull base, calvarium and extracranial soft
tissues are normal.

Sinuses/Orbits: No fluid levels or advanced mucosal thickening of
the visualized paranasal sinuses. No mastoid or middle ear effusion.
The orbits are normal.

CT CERVICAL SPINE FINDINGS

Cervical spine imaging is severely degraded by motion. Within that
limitation, there is no acute fracture. Alignment is grossly normal.
IMPRESSION: 1. Chronic ischemic microangiopathy and generalized atrophy without
acute intracranial abnormality.
2. Cervical spine imaging is severely degraded by motion. Within
that limitation, there is no acute fracture or static subluxation of
the cervical spine.

## 2020-10-11 IMAGING — CR DG LUMBAR SPINE COMPLETE 4+V
4 series · 4 of 4 positions shown · non-contrast
Comparison: [DATE]

CLINICAL DATA: Back pain, fall

EXAM:
LUMBAR SPINE - COMPLETE 4+ VIEW

[l-spine ap]
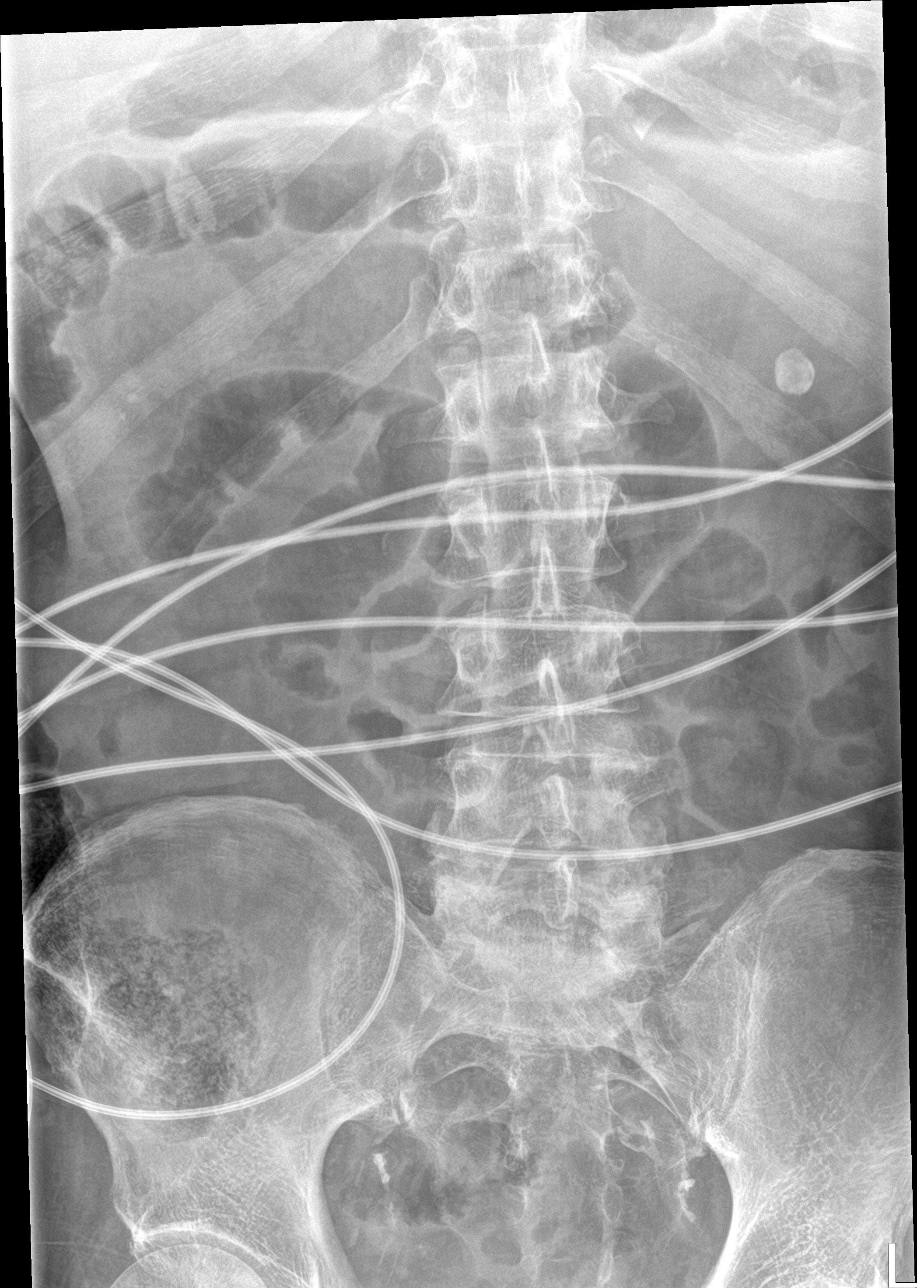

[l-spine obl (1 of 2)]
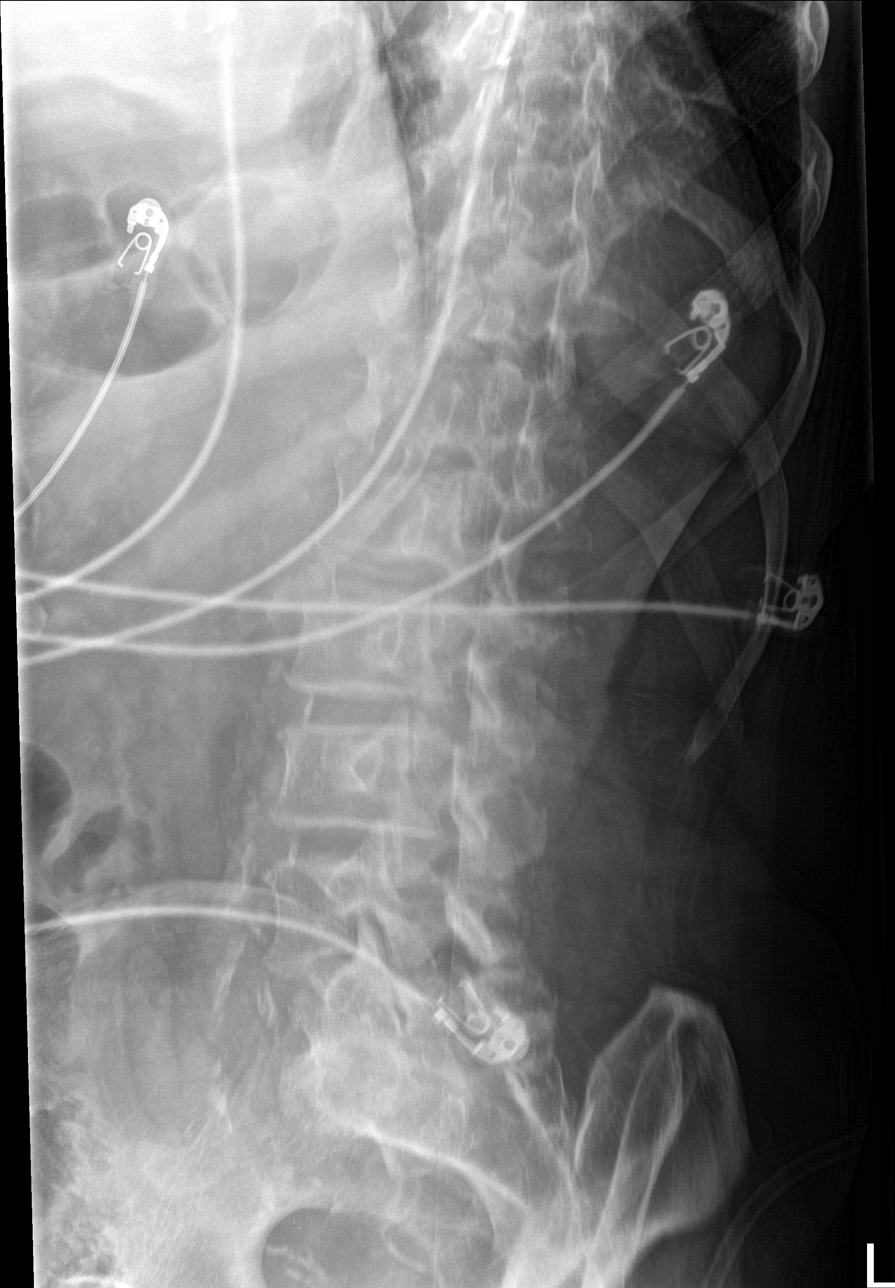

[l-spine obl (2 of 2)]
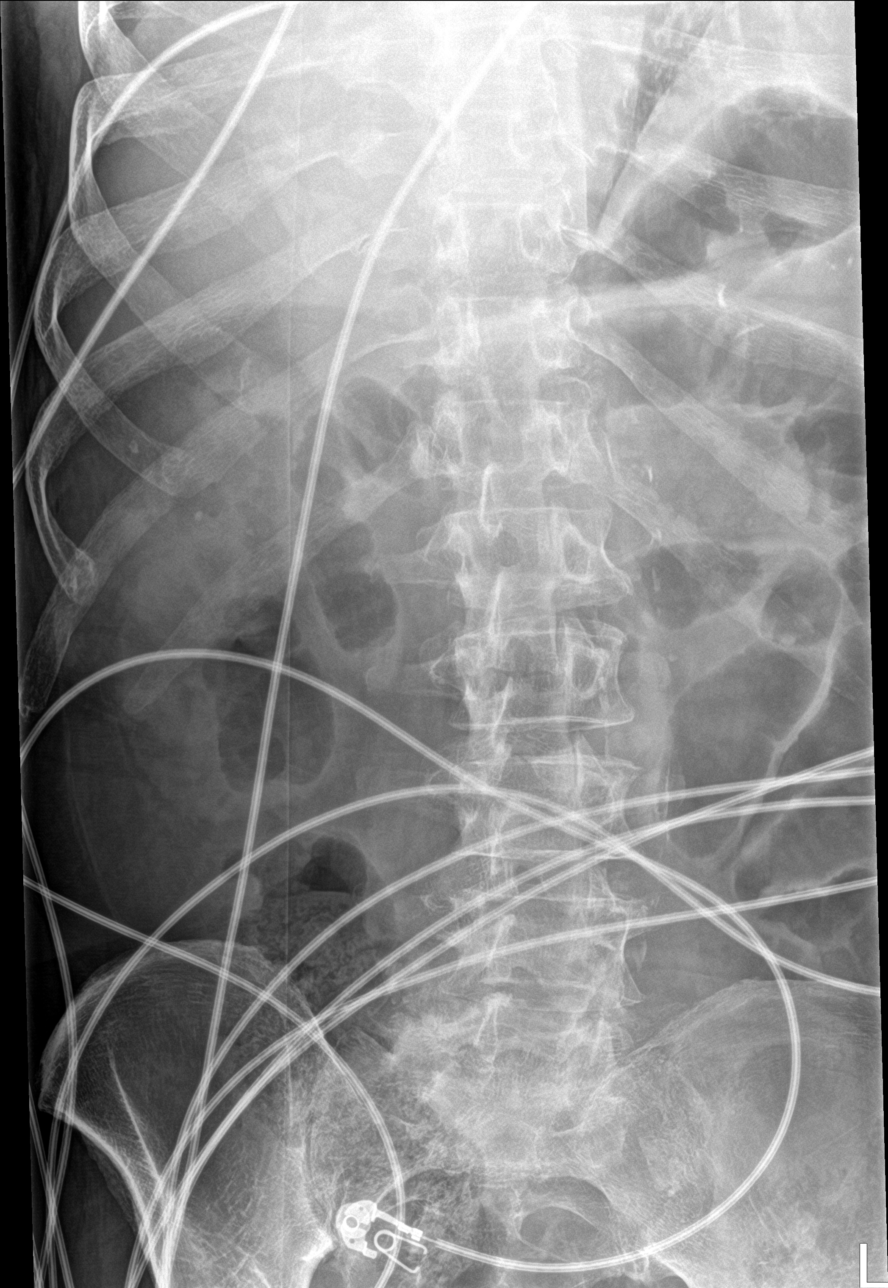

[l-spine lat]
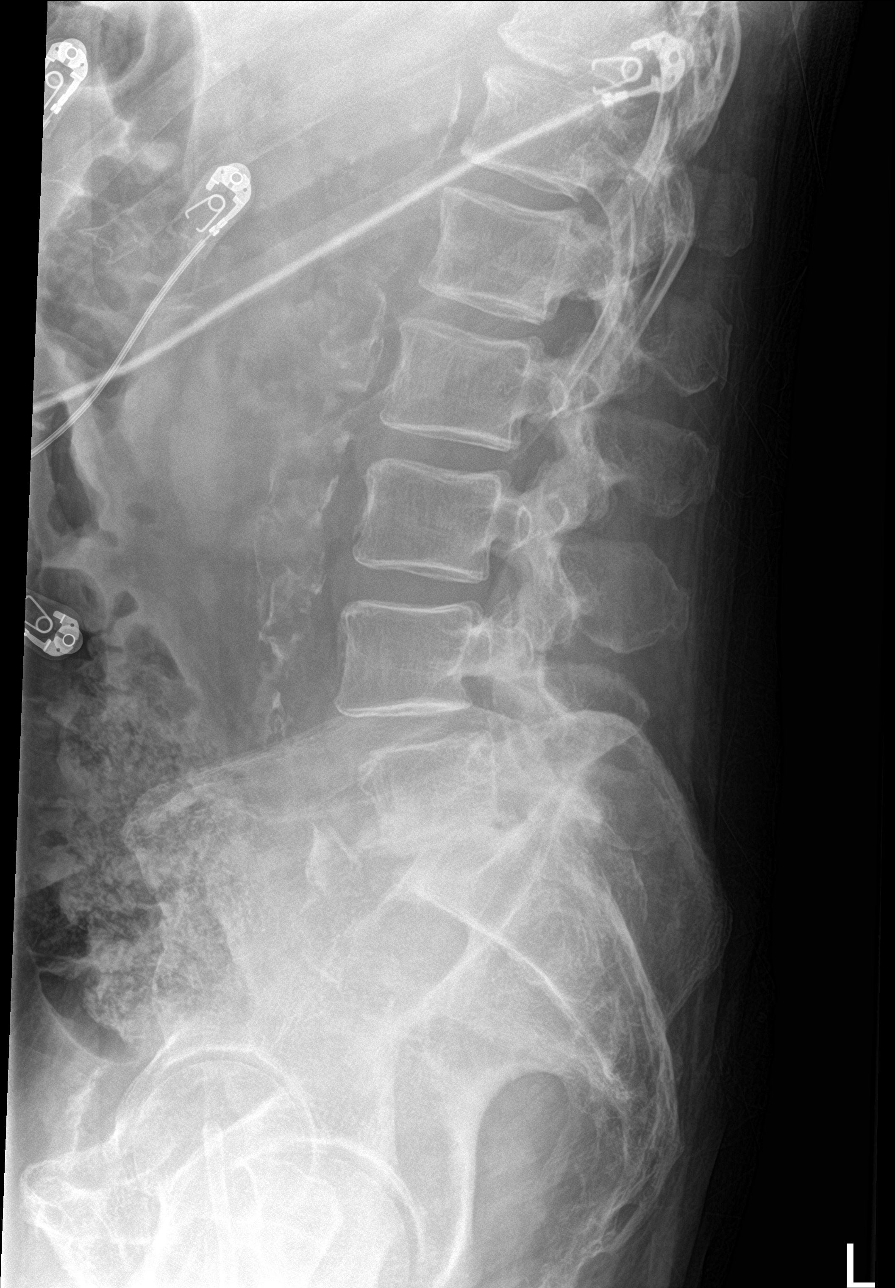

[4 of 4 positions shown; findings below may reference images not displayed]

FINDINGS: Stable lumbar alignment. Trace anterolisthesis L4 on L5. Vertebral
body heights are maintained. Moderate disc space narrowing and
degenerative change at L5-S1. Aortic atherosclerosis. Oval
calcification is seen in left upper quadrant on one view only
IMPRESSION: No acute osseous abnormality

## 2020-10-11 IMAGING — DX DG HIP (WITH OR WITHOUT PELVIS) 2-3V*L*
3 series · 3 of 3 positions shown · non-contrast
Comparison: Abdomen [DATE]

CLINICAL DATA: Fall on blood thinners.

EXAM:
DG HIP (WITH OR WITHOUT PELVIS) 2-3V LEFT

[pelvis ap]
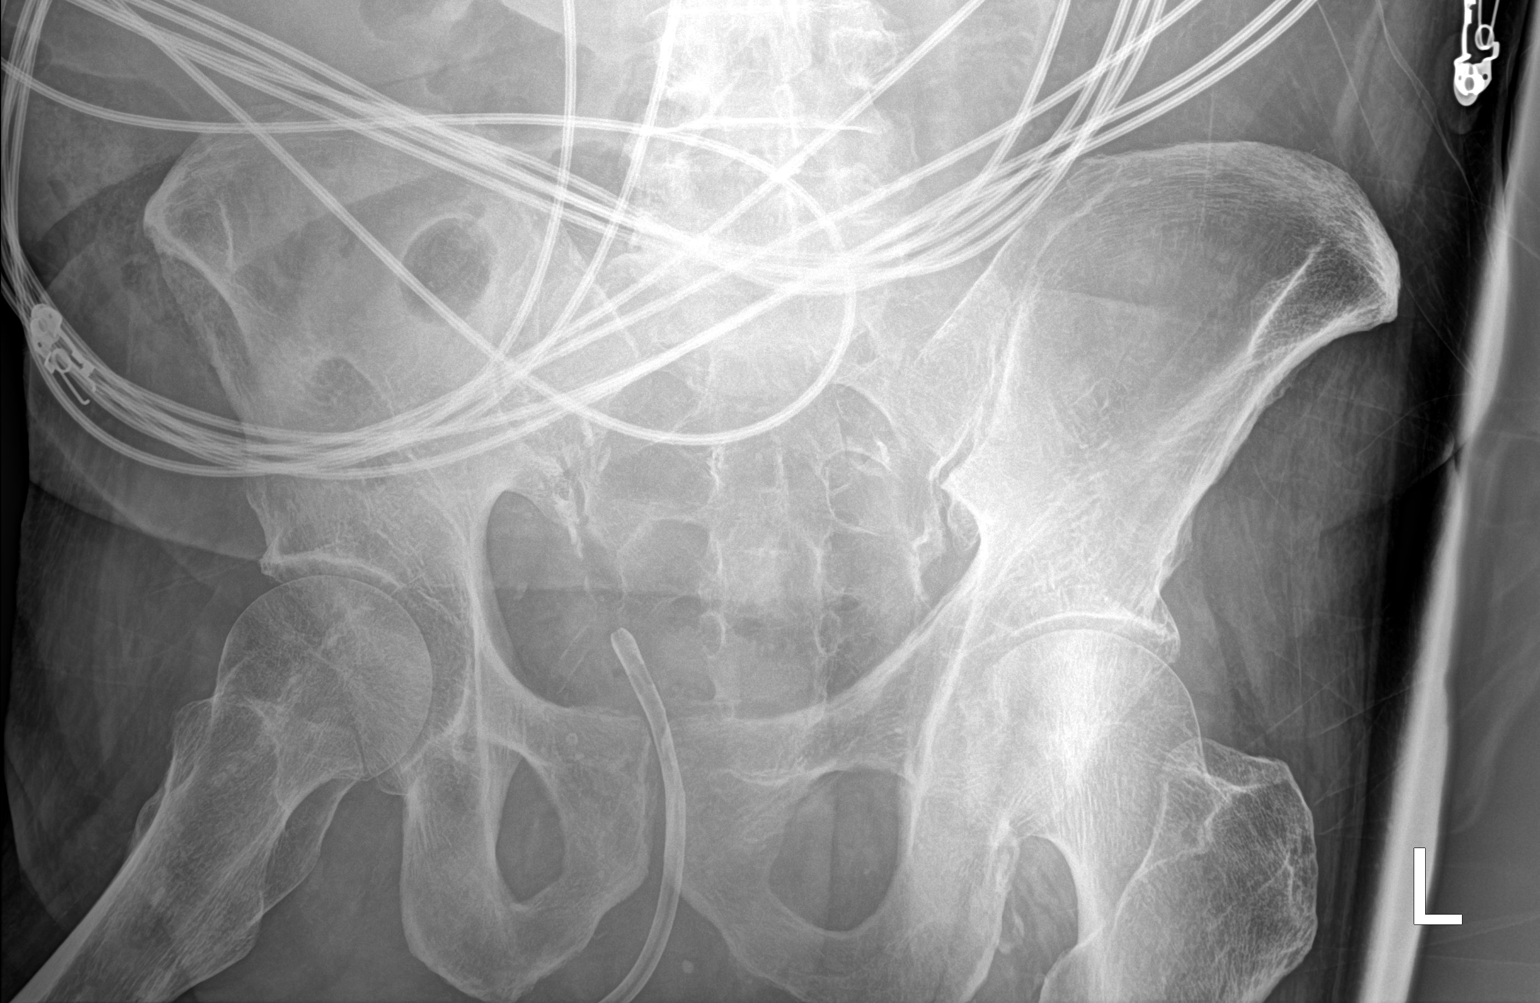

[hip ap]
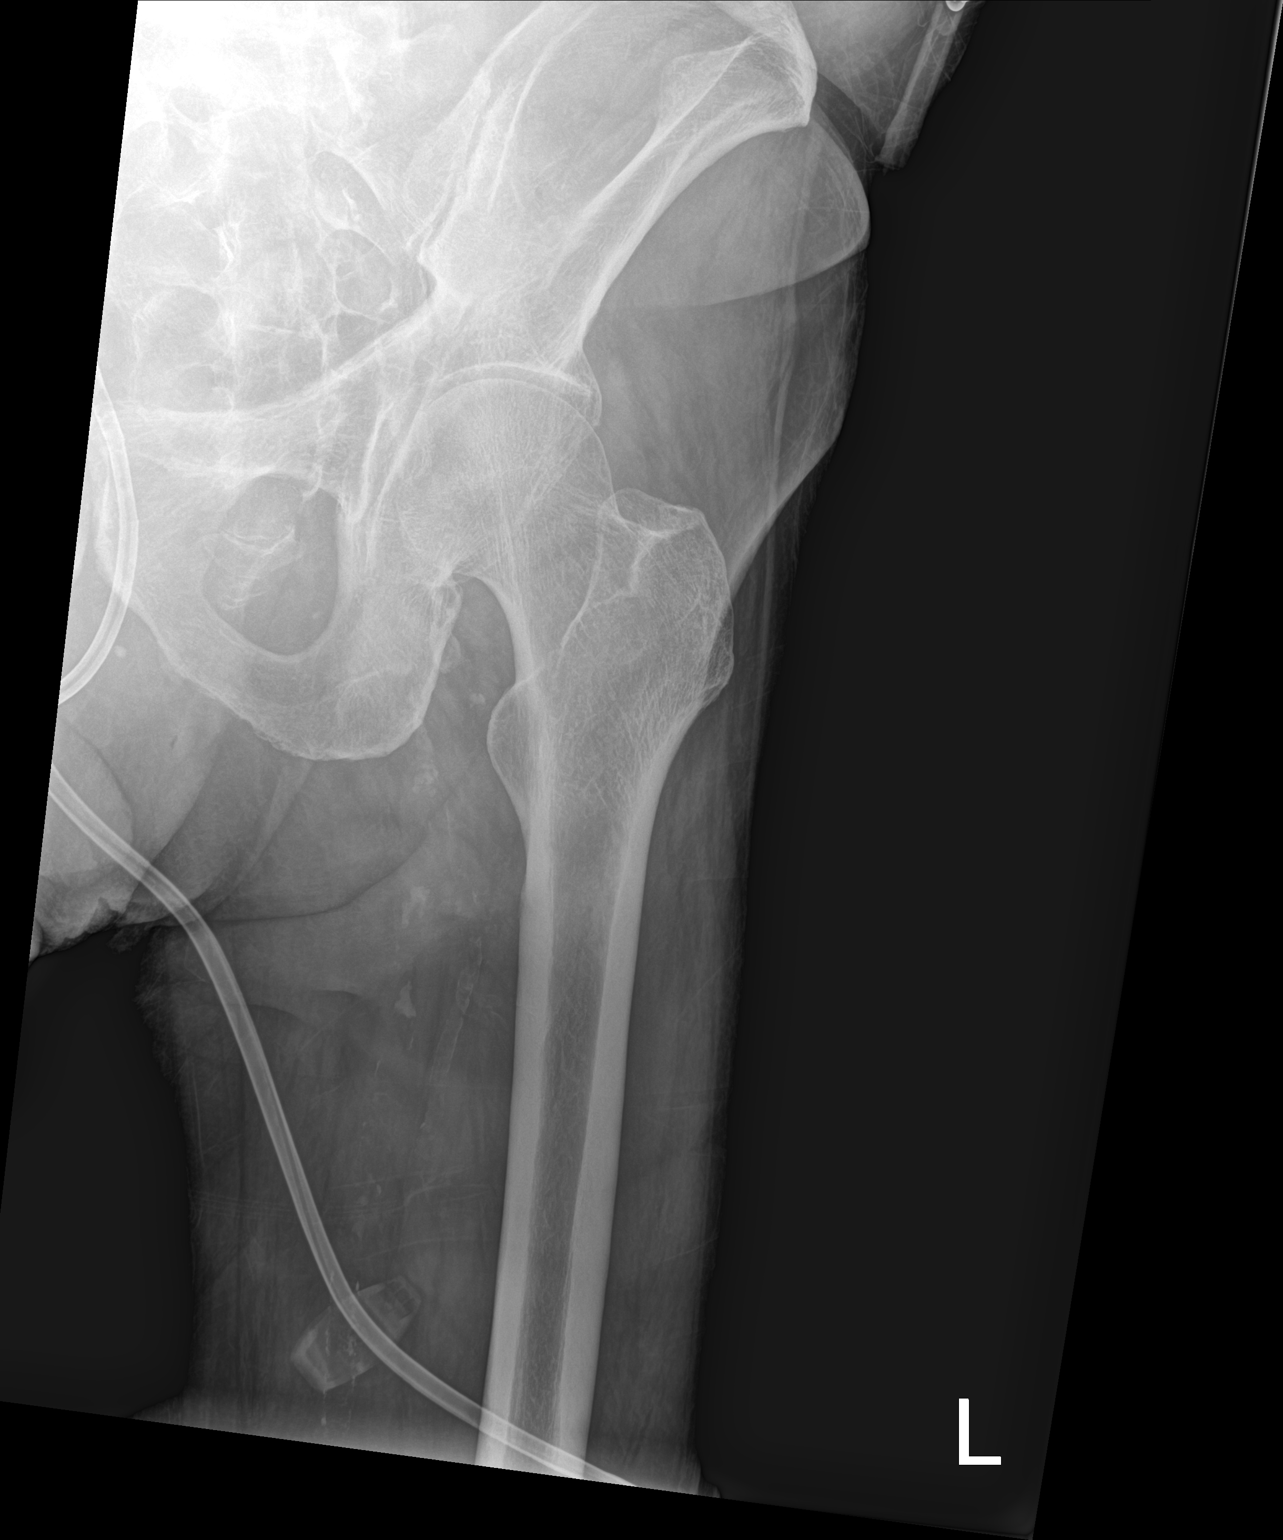

[hip lat]
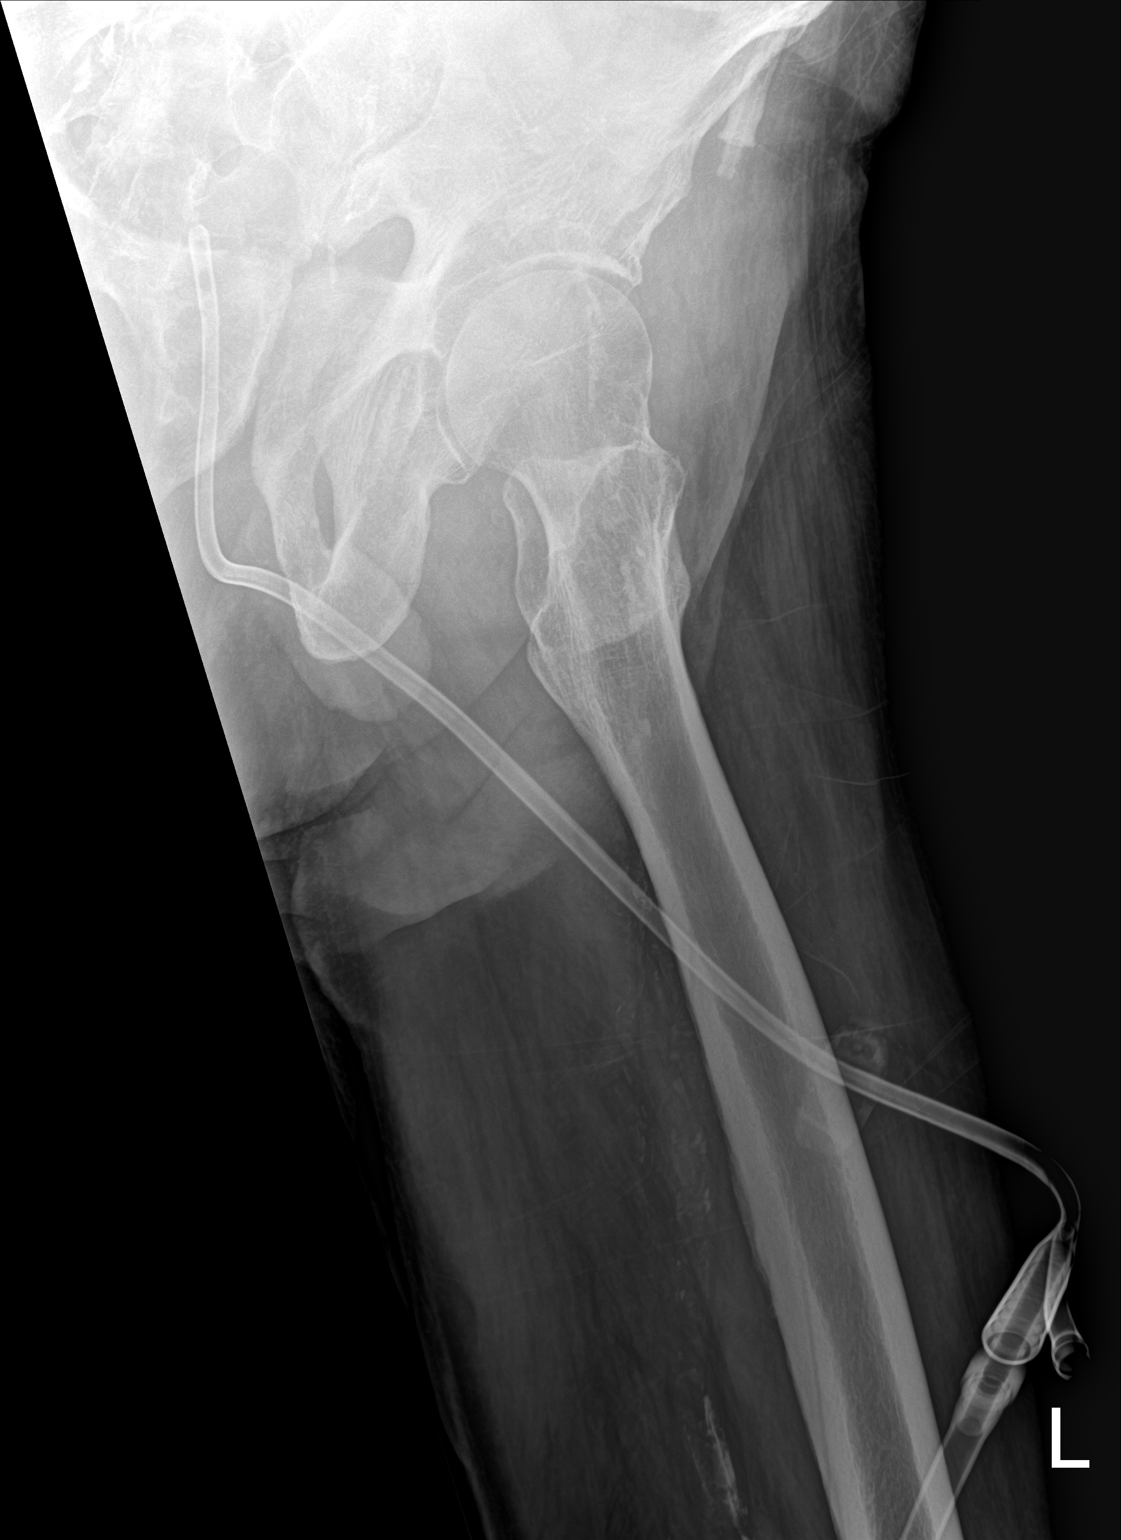

[3 of 3 positions shown; findings below may reference images not displayed]

FINDINGS: Catheter projected over the midline likely representing a Foley
catheter. Pelvis and left hip appear intact. No acute displaced
fractures identified. Vascular calcifications. SI joints and
symphysis pubis are not displaced.
IMPRESSION: No acute displaced fractures identified.

## 2020-10-11 IMAGING — CR DG THORACIC SPINE 2V
2 series · 2 of 2 positions shown · non-contrast
Comparison: None.

CLINICAL DATA: Back pain, fall

EXAM:
THORACIC SPINE 2 VIEWS

[t-spine ap]
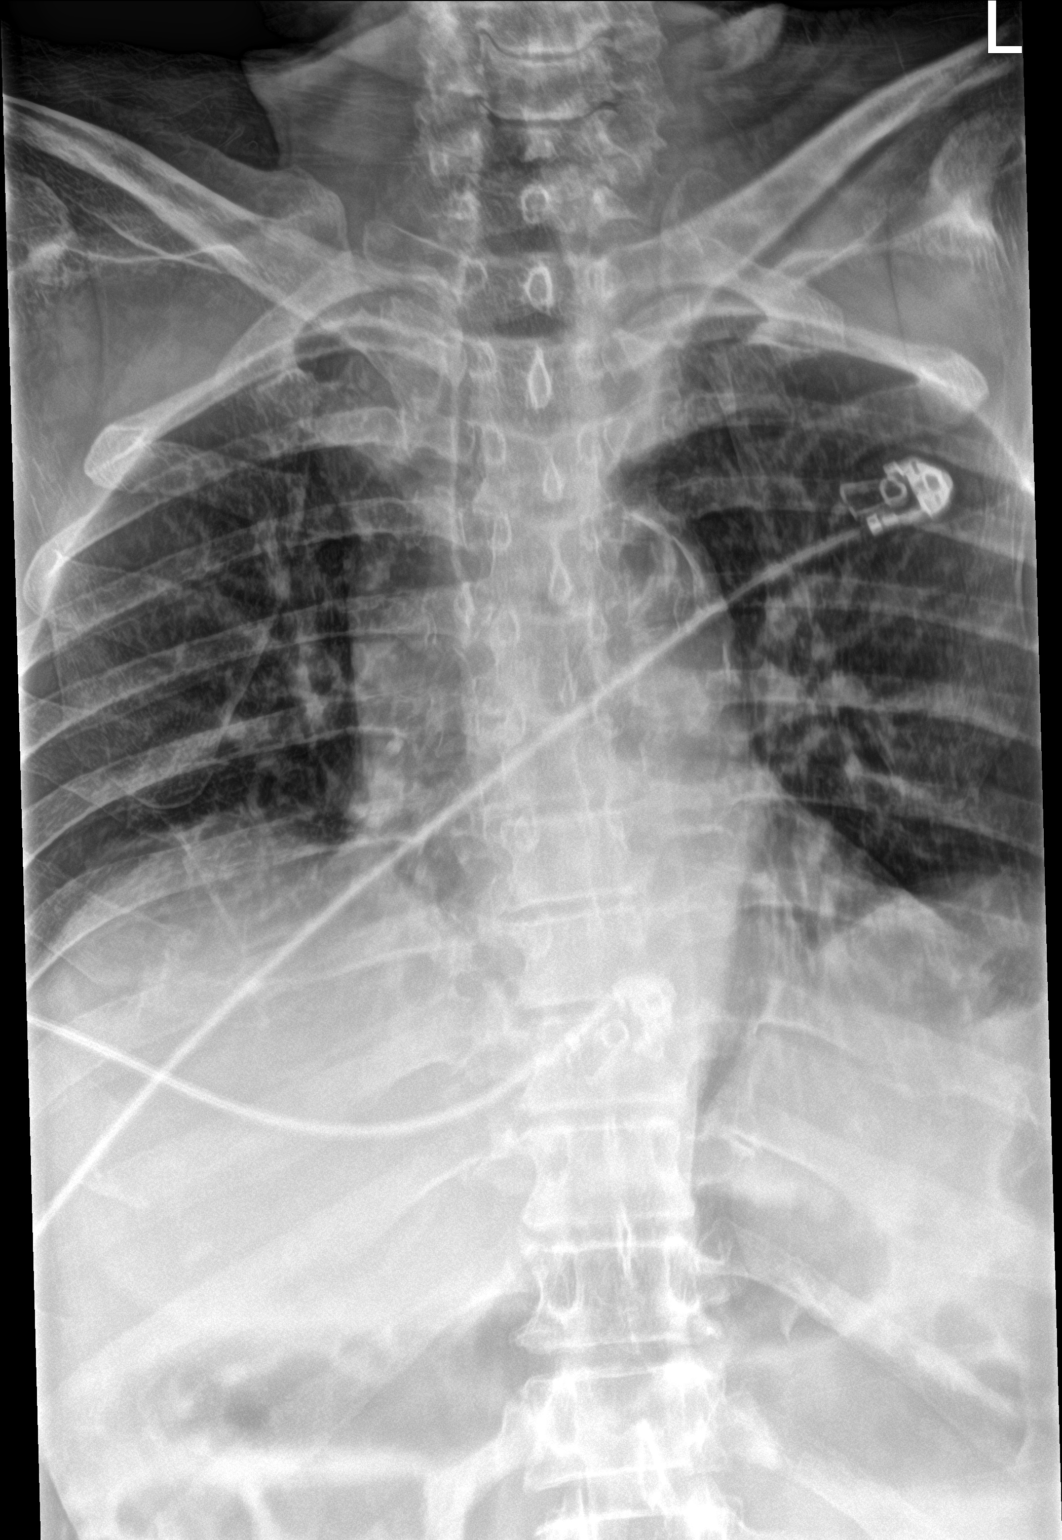

[t-spine lat]
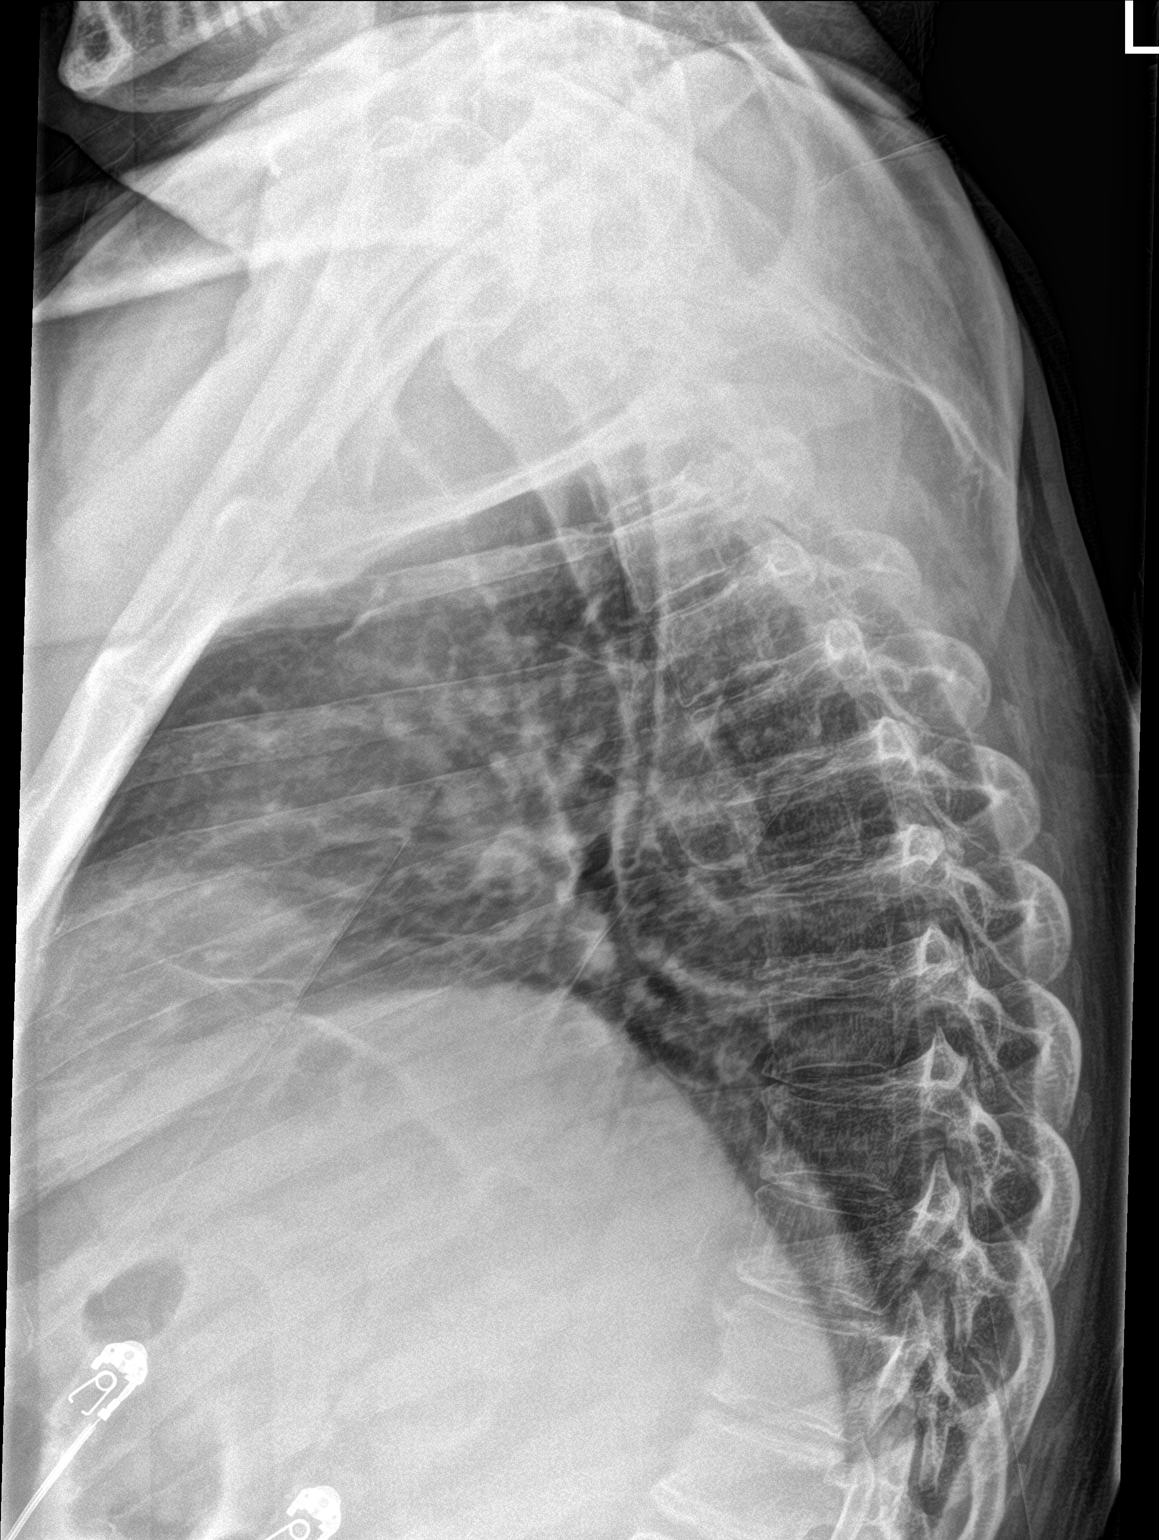

[2 of 2 positions shown; findings below may reference images not displayed]

FINDINGS: Thoracic alignment within normal limits. Vertebral body heights are
maintained. Mild degenerative osteophytes.
IMPRESSION: No acute osseous abnormality

## 2020-10-11 IMAGING — DX DG KNEE 1-2V*R*
2 series · 2 of 2 positions shown · non-contrast
Comparison: None.

CLINICAL DATA: Post fall with bilateral knee pain.

EXAM:
RIGHT KNEE - 1-2 VIEW

[knee ap]
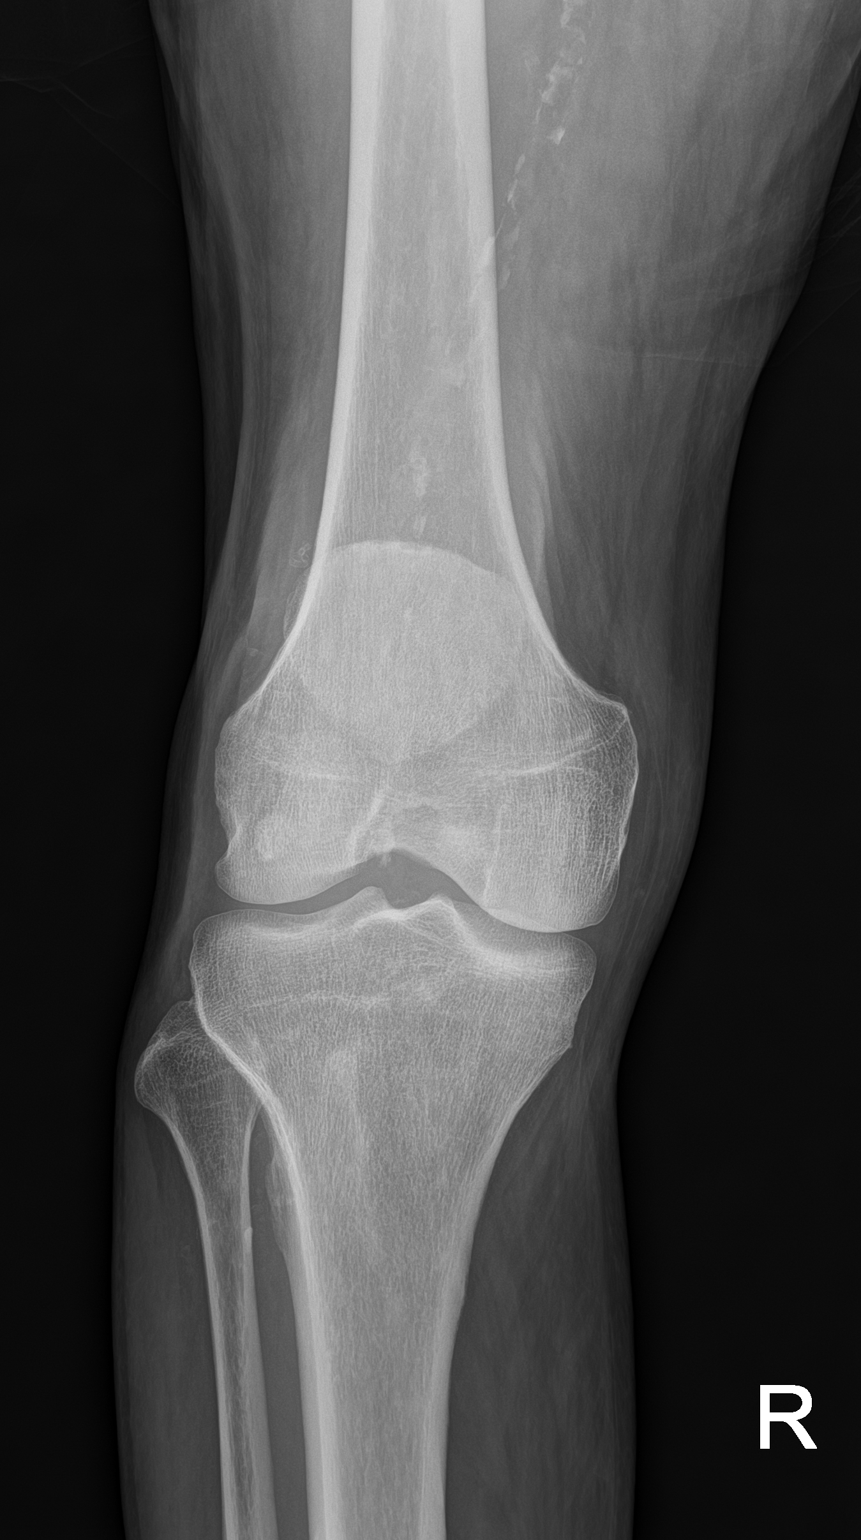

[knee lat]
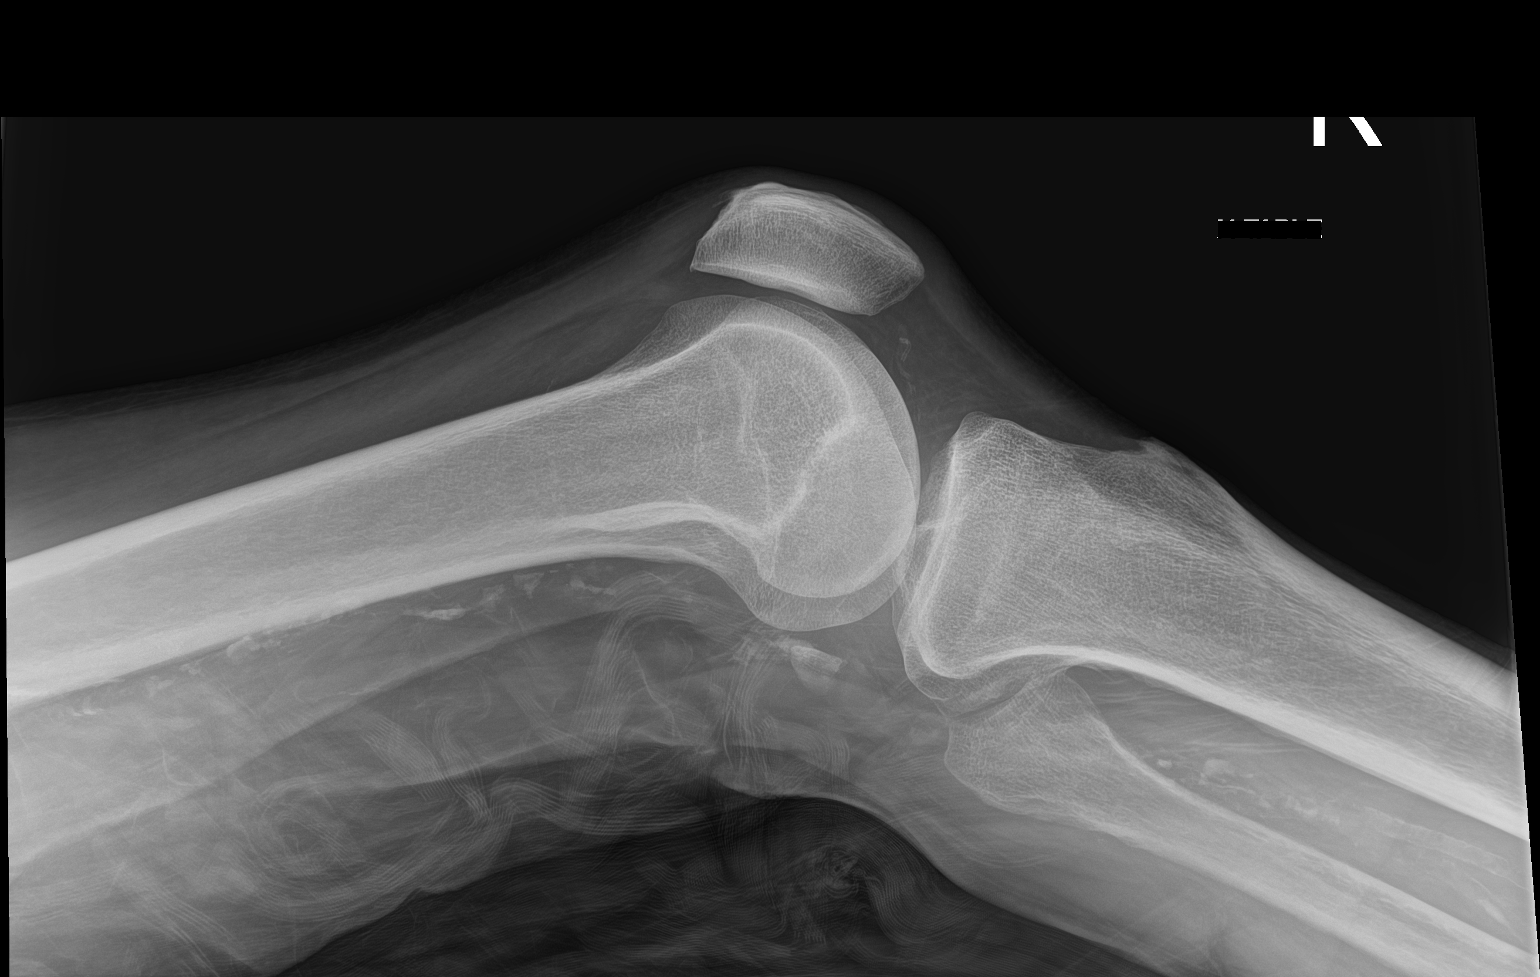

[2 of 2 positions shown; findings below may reference images not displayed]

FINDINGS: No evidence of fracture, dislocation, or joint effusion. Normal
joint spaces. Prominent vascular calcifications. Soft tissues are
unremarkable.
IMPRESSION: 1. No fracture or subluxation of the right knee.
2. Prominent vascular calcifications.

## 2020-10-11 IMAGING — CT CT HEAD W/O CM
4 series · 15 of 47 positions shown, 17 images · non-contrast
Comparison: [DATE] head CT and cervical spine CT

CLINICAL DATA: Fall

EXAM:
CT HEAD WITHOUT CONTRAST
CT CERVICAL SPINE WITHOUT CONTRAST
TECHNIQUE: Multidetector CT imaging of the head and cervical spine was
performed following the standard protocol without intravenous
contrast. Multiplanar CT image reconstructions of the cervical spine
were also generated.

[Series 1: head without · axial · non-contrast · 0.52mm/px · z∈[+60,+200]mm · 7 of 38 slices shown, 9 images]
[im 5/38  brain]
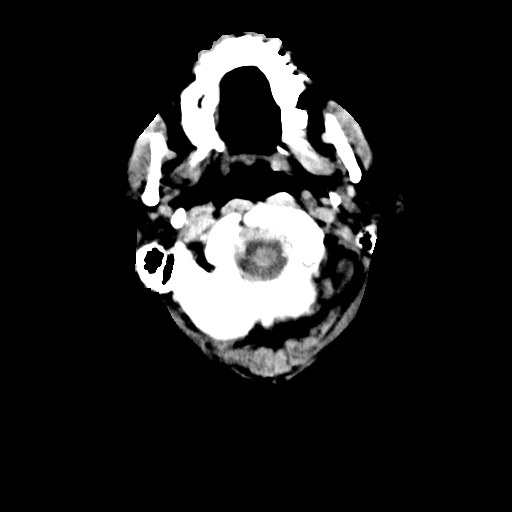
[im 5/38  bone]
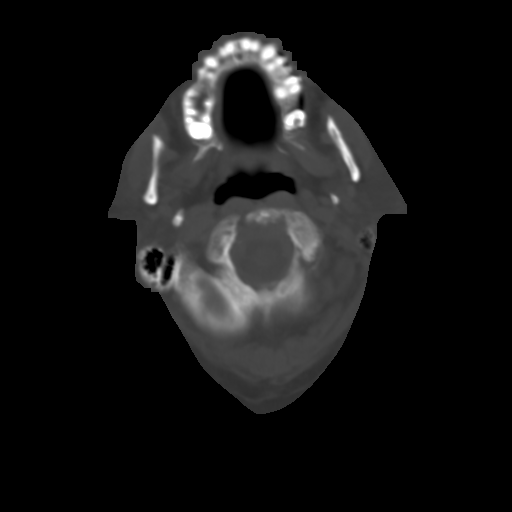
[im 10/38  brain]
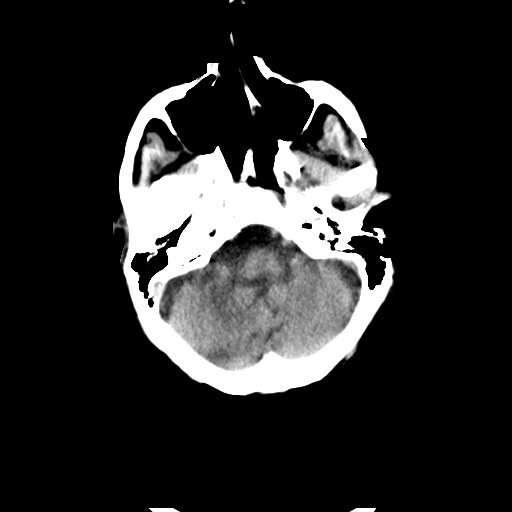
[im 14/38  brain]
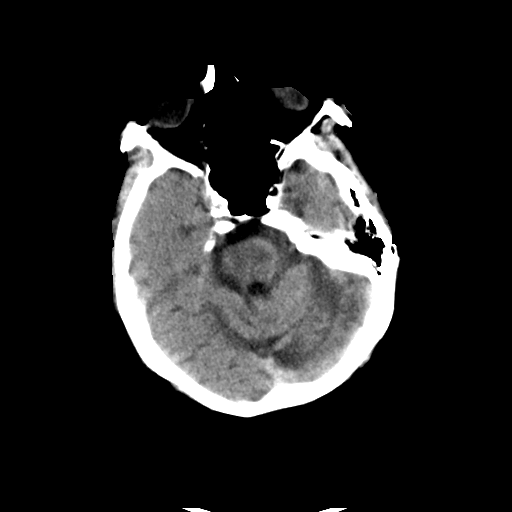
[im 19/38  brain]
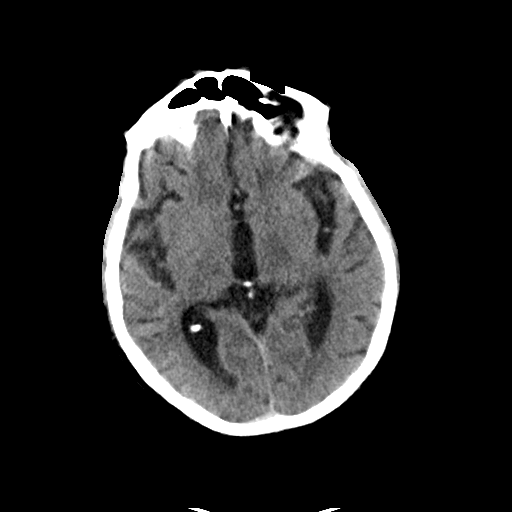
[im 24/38  brain]
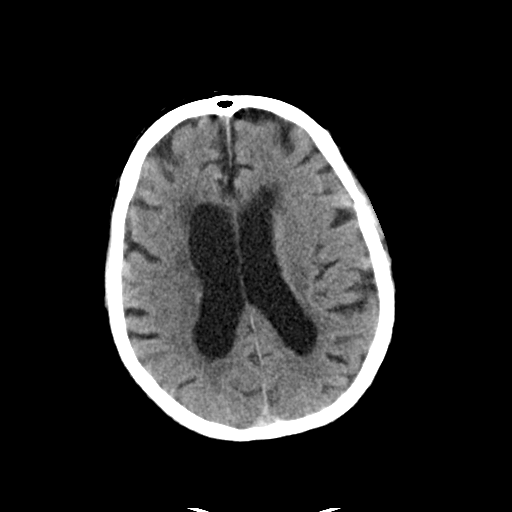
[im 24/38  bone]
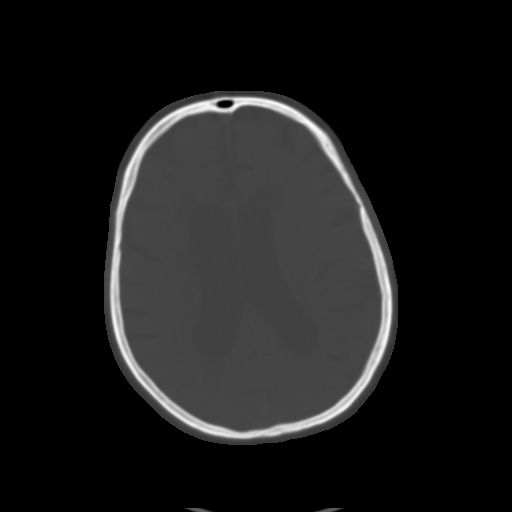
[im 28/38  brain]
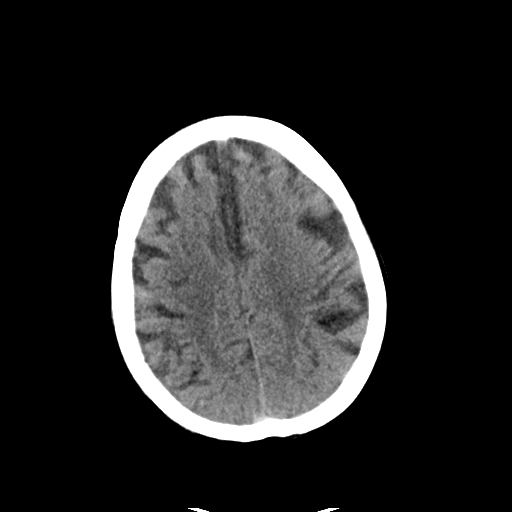
[im 33/38  brain]
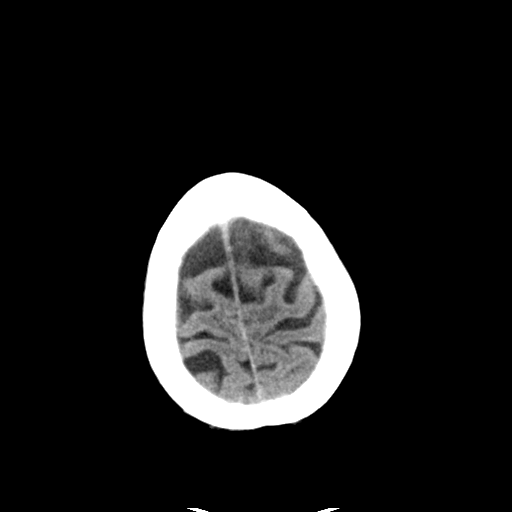

[Series 4: head bone · axial · 0.52mm/px · z∈[+58,+76]mm · 2 of 95 slices shown]
[im 10/95  bone]
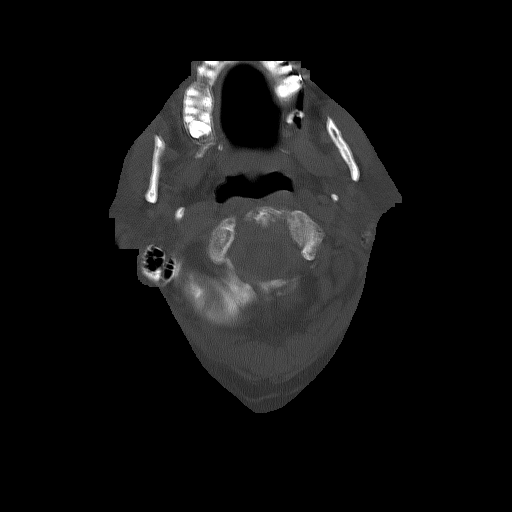
[im 19/95  bone]
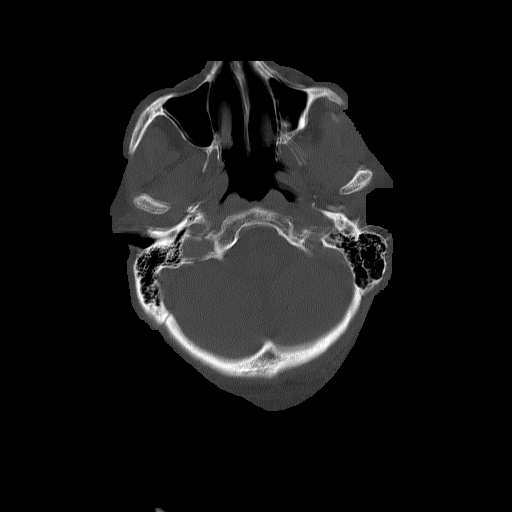

[Series 5: head without cor · coronal · non-contrast · 0.37mm/px · 3 of 78 slices shown]
[im 26/78  brain]
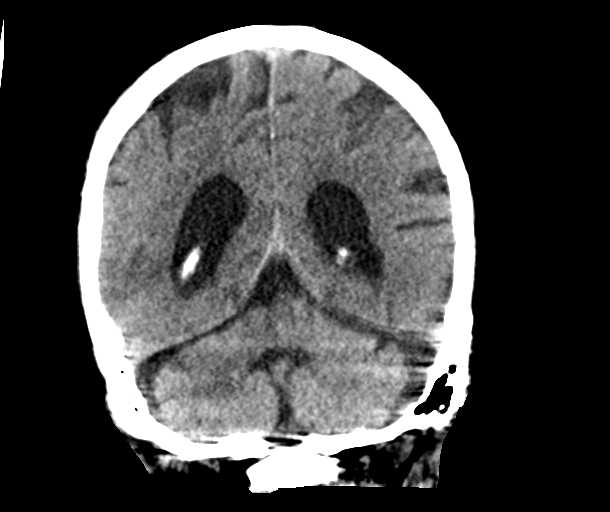
[im 35/78  brain]
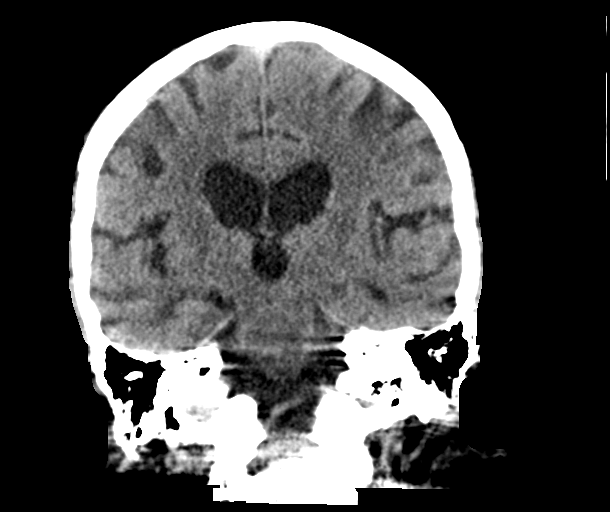
[im 43/78  brain]
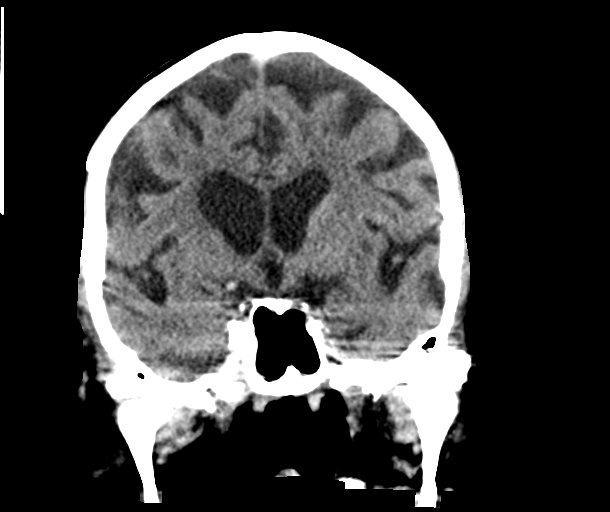

[Series 6: head without sag · sagittal · non-contrast · 0.38mm/px · 3 of 67 slices shown]
[im 23/67  brain]
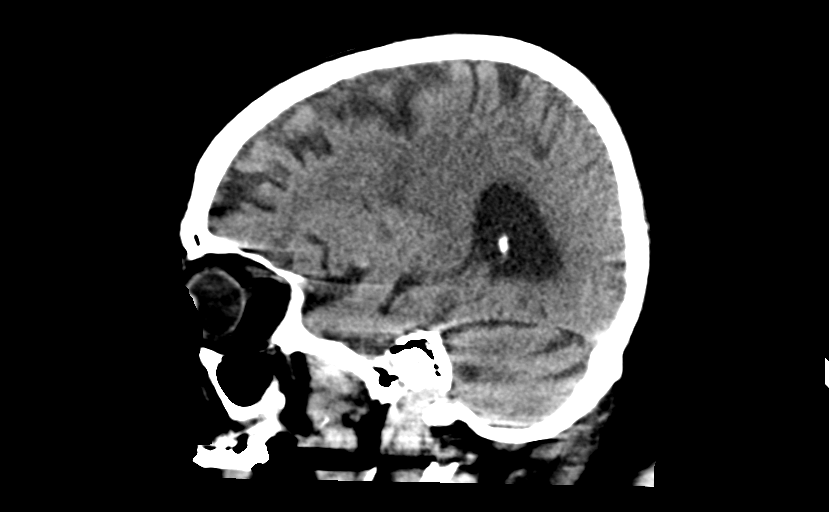
[im 34/67  brain]
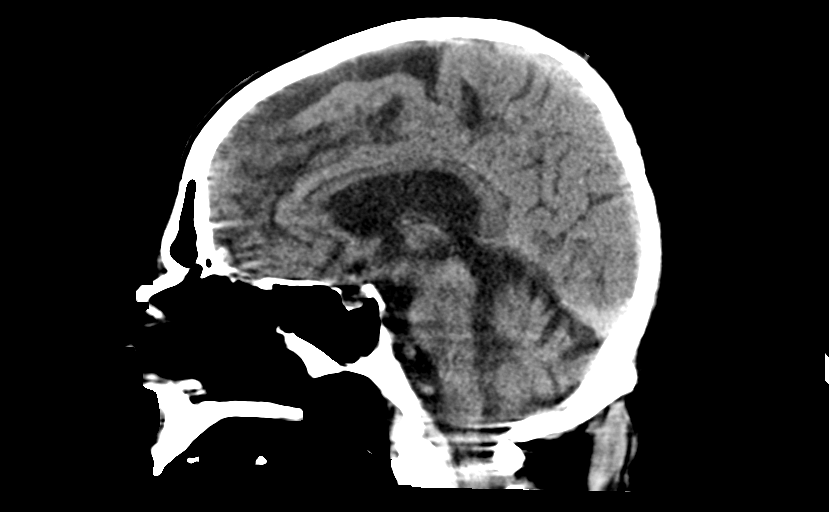
[im 45/67  brain]
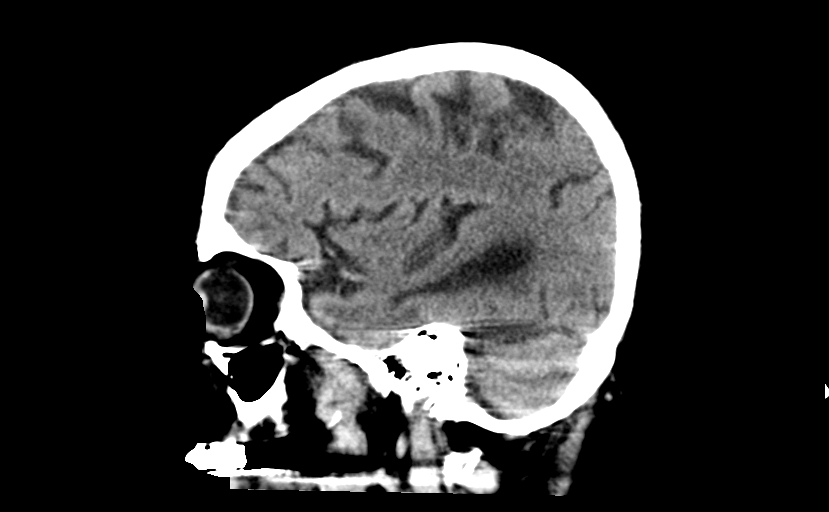

[15 of 47 positions shown; findings below may reference images not displayed]

FINDINGS: CT HEAD FINDINGS

Brain: There is no mass, hemorrhage or extra-axial collection. There
is generalized atrophy without lobar predilection. There is
hypoattenuation of the periventricular white matter, most commonly
indicating chronic ischemic microangiopathy.

Vascular: Atherosclerotic calcification of the internal carotid
arteries at the skull base. No abnormal hyperdensity of the major
intracranial arteries or dural venous sinuses.

Skull: The visualized skull base, calvarium and extracranial soft
tissues are normal.

Sinuses/Orbits: No fluid levels or advanced mucosal thickening of
the visualized paranasal sinuses. No mastoid or middle ear effusion.
The orbits are normal.

CT CERVICAL SPINE FINDINGS

Cervical spine imaging is severely degraded by motion. Within that
limitation, there is no acute fracture. Alignment is grossly normal.
IMPRESSION: 1. Chronic ischemic microangiopathy and generalized atrophy without
acute intracranial abnormality.
2. Cervical spine imaging is severely degraded by motion. Within
that limitation, there is no acute fracture or static subluxation of
the cervical spine.

## 2020-10-11 IMAGING — DX DG HIP (WITH OR WITHOUT PELVIS) 2-3V*R*
2 series · 2 of 2 positions shown · non-contrast
Comparison: [DATE]

CLINICAL DATA: Fell on blood thinners.

EXAM:
DG HIP (WITH OR WITHOUT PELVIS) 2-3V RIGHT

[hip ap]
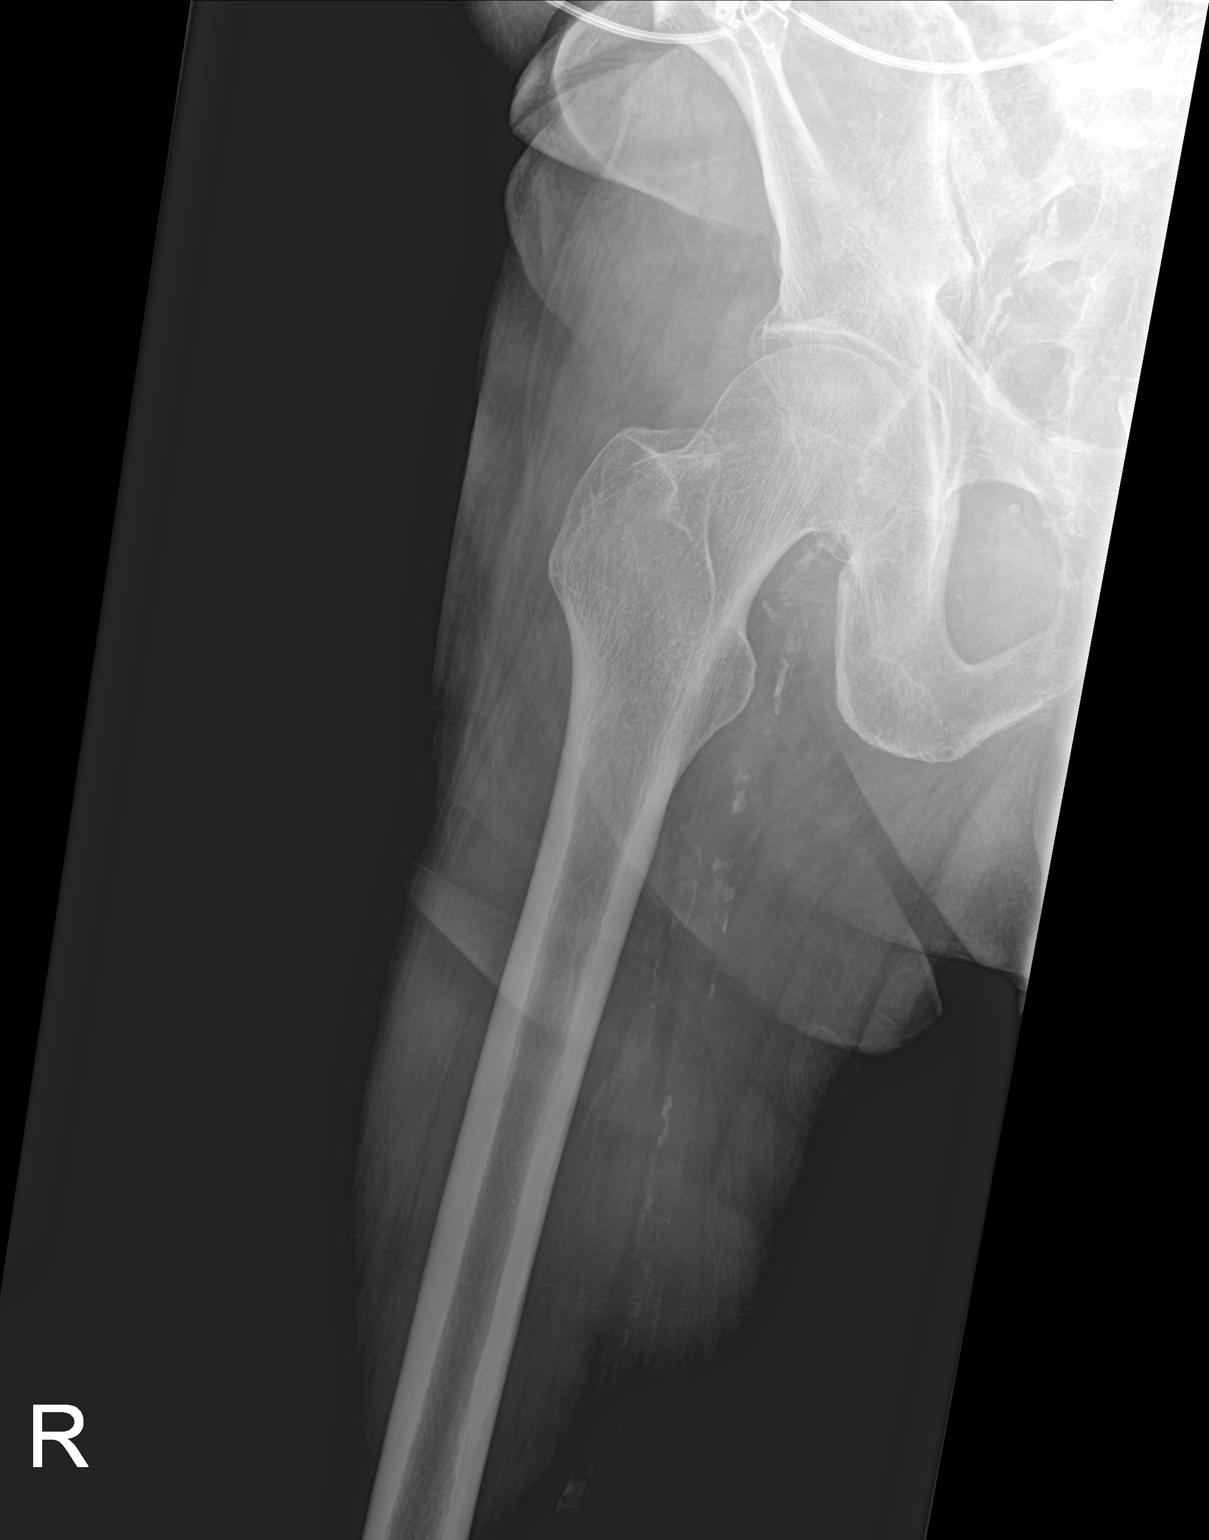

[hip lat]
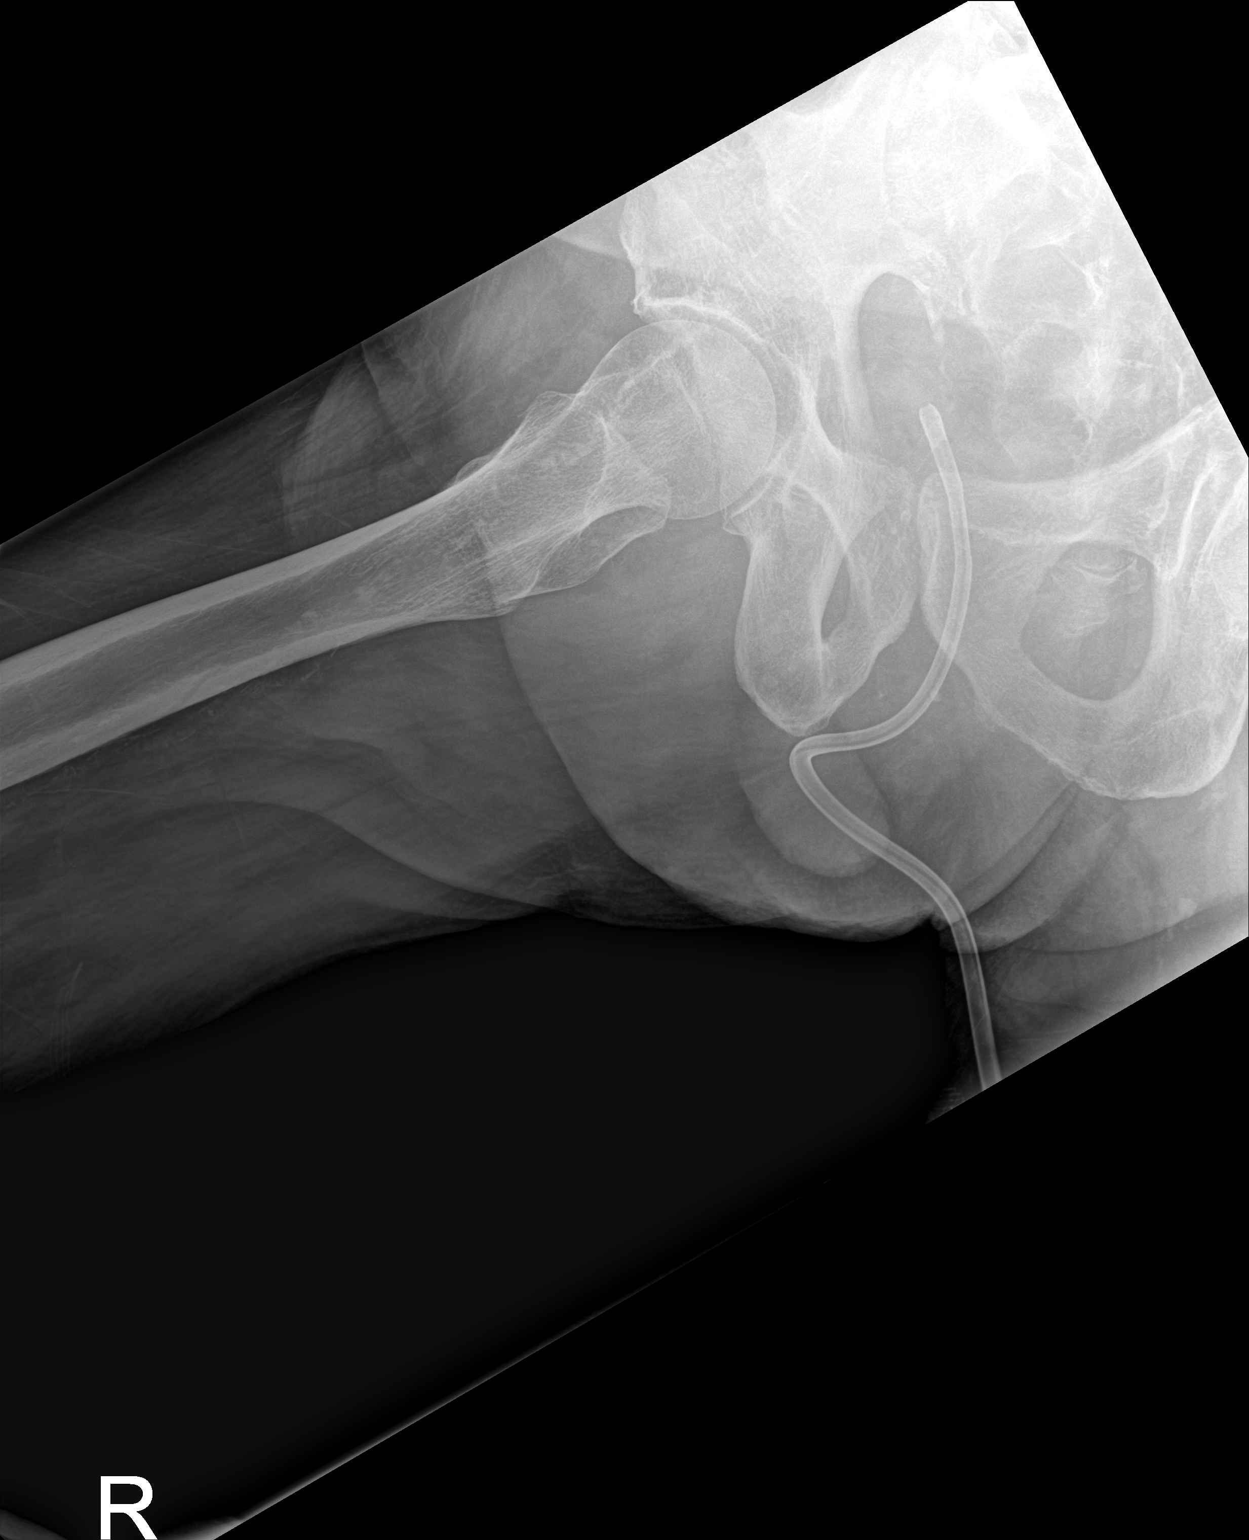

[2 of 2 positions shown; findings below may reference images not displayed]

FINDINGS: Foley catheter. Right hip appears intact. No evidence of acute
fracture or dislocation. No focal bone lesion or bone destruction.
Mild degenerative changes in the hips. Vascular calcifications.
IMPRESSION: No acute displaced fractures identified.

## 2020-10-11 IMAGING — DX DG CHEST 1V PORT
1 series · 1 of 1 positions shown · non-contrast
Comparison: [DATE], CT [DATE], radiograph [DATE]

CLINICAL DATA: Fall

EXAM:
PORTABLE CHEST 1 VIEW

[chest ap]
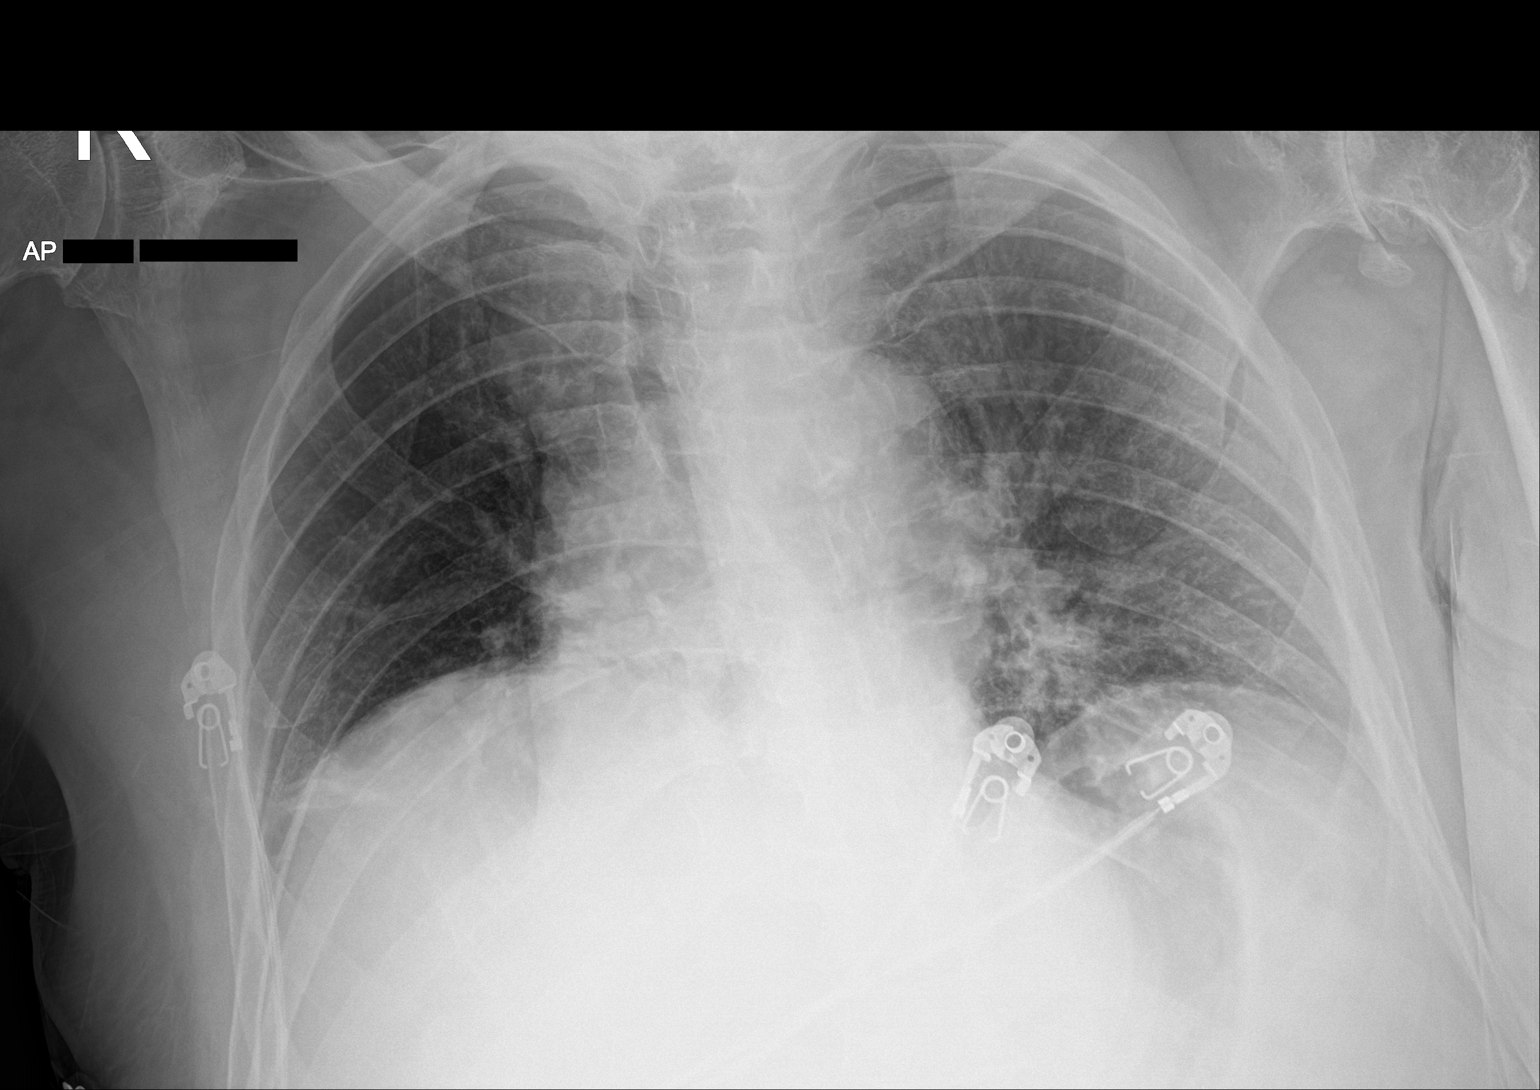

[1 of 1 positions shown; findings below may reference images not displayed]

FINDINGS: Diminished lung volumes with elevation of right diaphragm. Streaky
atelectasis at the right base. No consolidation, pleural effusion or
pneumothorax. Stable cardiomediastinal silhouette.
IMPRESSION: Low lung volumes with elevated right diaphragm and subsegmental
atelectasis at the right base.

## 2020-10-11 MED ORDER — METOPROLOL TARTRATE 5 MG/5ML IV SOLN
5.0000 mg | Freq: Once | INTRAVENOUS | Status: AC
  Administered 2020-10-11: 5 mg via INTRAVENOUS
  Filled 2020-10-11: qty 5

## 2020-10-11 MED ORDER — LACTATED RINGERS IV BOLUS
1000.0000 mL | Freq: Once | INTRAVENOUS | Status: AC
  Administered 2020-10-11: 1000 mL via INTRAVENOUS

## 2020-10-11 MED ORDER — LORAZEPAM 2 MG/ML IJ SOLN
1.0000 mg | Freq: Once | INTRAMUSCULAR | Status: AC
  Administered 2020-10-11: 1 mg via INTRAVENOUS
  Filled 2020-10-11: qty 1

## 2020-10-11 MED ORDER — SODIUM CHLORIDE 0.9 % IV SOLN
1.0000 g | Freq: Once | INTRAVENOUS | Status: AC
  Administered 2020-10-12: 1 g via INTRAVENOUS
  Filled 2020-10-11: qty 10

## 2020-10-11 MED ORDER — FENTANYL CITRATE (PF) 100 MCG/2ML IJ SOLN
75.0000 ug | Freq: Once | INTRAMUSCULAR | Status: AC
Start: 2020-10-11 — End: 2020-10-11
  Administered 2020-10-11: 75 ug via INTRAVENOUS
  Filled 2020-10-11: qty 2

## 2020-10-11 NOTE — ED Provider Notes (Signed)
[facility] EMERGENCY DEPARTMENT Provider Note   CSN: [id number] Arrival date & time: 10/11/20  1953     History Chief Complaint  Patient presents with   Jason Hartman    Jason Hartman is a 85 y.o. male.   Fall   Patient states he hates living at the nursing facility where he resides.  He decided he was going to try to get out of bed on his own and crawl out of the facility.  Patient was found on the ground by staff.  Patient states he is having some discomfort in his knees and he did hit his head.  He primarily is complaining of pain in his legs.  Patient states he has restless leg syndrome and he has not had his medications.  His legs are very uncomfortable and he needs something to help him stay still.  Denies any fevers or chills.  No vomiting or diarrhea.  Past Medical History:  Diagnosis Date   Cellulitis and abscess of leg, except foot    Hypertension    Obstructive sleep apnea (adult) (pediatric)    Pain in joint, pelvic region and thigh    Pneumonia, organism unspecified(486)    Restless legs syndrome (RLS)    RLS (restless legs syndrome) 09/01/2012   Thoracic or lumbosacral neuritis or radiculitis, unspecified    Type II or unspecified type diabetes mellitus without mention of complication, not stated as uncontrolled    Unspecified disease of pericardium    Unspecified hereditary and idiopathic peripheral neuropathy     Patient Active Problem List   Diagnosis Date Noted   AMS (altered mental status)    PAF (paroxysmal atrial fibrillation) (HCC)    Acute metabolic encephalopathy 57/32/2025   Anxiety 09/09/2020   SIRS (systemic inflammatory response syndrome) ([place]) 09/09/2020   Diaphoresis    Slow transit constipation 08/07/2020   Abdominal aortic aneurysm without rupture ([place]) 03/17/2020   Abnormal gait 03/17/2020   Ascending aorta dilatation ([place]) 03/17/2020   Diabetic renal disease ([place]) 03/17/2020   Recurrent UTI 07/16/2019   Hypertensive urgency  04/26/2019   Malignant HTN with heart disease, w/o CHF, w/o chronic kidney disease 04/26/2019   Elevated troponin    LFTs abnormal    Statin intolerance 01/15/2019   Penis pain 04/10/2018   Urethra cancer ([place]) 02/25/2018   Lower extremity edema 06/09/2017   Peptic ulcer disease 05/16/2017   GERD (gastroesophageal reflux disease) 05/16/2017   Chronic kidney disease, stage 3a ([place]) 04/11/2017   Lower urinary tract symptoms (LUTS) 04/15/2016   Narcotic dependence ([place]) 08/31/2015   Imbalance 08/31/2015   Diabetic peripheral neuropathy ([place]) 08/31/2015   Other malaise and fatigue 05/02/2015   Chronic fatigue 05/02/2015   Panic disorder without agoraphobia 03/22/2015   Hyperlipidemia 02/03/2014   Thrombocytopenia ([place]) 08/18/2013   Parkinsonism ([place]) 08/18/2013   Other disorders of lung 08/18/2013   Organic impotence 08/18/2013   Memory loss 08/18/2013   Low back pain 08/18/2013   Iron deficiency anemia 08/18/2013   Hypercalcemia 08/18/2013   Cellulitis of right leg 08/18/2013   Atherosclerotic heart disease of native coronary artery without angina pectoris 08/18/2013   RLS (restless legs syndrome) 09/01/2012   HERPES SIMPLEX INFECTION 11/06/2006   Type II diabetes mellitus ([place]) 11/06/2006   HYPOGONADISM 11/06/2006   VITAMIN D DEFICIENCY 11/06/2006   ANXIETY 11/06/2006   OBSTRUCTIVE SLEEP APNEA 11/06/2006   RESTLESS LEG SYNDROME 11/06/2006   HYPERTENSION 11/06/2006   BENIGN PROSTATIC HYPERTROPHY 11/06/2006   ROSACEA 11/06/2006  OSTEOARTHRITIS 11/06/2006   INSOMNIA 11/06/2006   HYPERGLYCEMIA 11/06/2006   COLONIC POLYPS, HX OF 11/06/2006    Past Surgical History:  Procedure Laterality Date   ANAL FISSURE REPAIR     pericarditis  2009       Family History  Problem Relation Age of Onset   Diabetes Father     Social History   Tobacco Use   Smoking status: Former    Pack years: 0.00    Types: Cigarettes    Quit date: 09/01/1980    Years since quitting: 40.1    Smokeless tobacco: Never  Vaping Use   Vaping Use: Never used  Substance Use Topics   Alcohol use: No   Drug use: No    Home Medications Prior to Admission medications   Medication Sig Start Date End Date Taking? Authorizing Provider  acetaminophen (TYLENOL) 500 MG tablet Take 500 mg by mouth every 6 (six) hours as needed for mild pain.    [provider]  allopurinol (ZYLOPRIM) 100 MG tablet Take 2 tablets daily. Patient not taking: No sig reported 02/03/17   Trula Slade, DPM  amiodarone (PACERONE) 200 MG tablet Take 1 tablet (200 mg total) by mouth daily. 09/26/20   Mercy Riding, MD  apixaban (ELIQUIS) 5 MG TABS tablet Take 1 tablet (5 mg total) by mouth 2 (two) times daily. 09/29/20 10/29/20  Mercy Riding, MD  BD PEN NEEDLE NANO 2ND GEN 32G X 4 MM MISC FOR LANTUS AND VICTOZA PENS 11/30/19   [provider]  ferrous sulfate 325 (65 FE) MG EC tablet Take 325 mg by mouth daily.  05/28/18   [provider]  fluticasone (FLONASE) 50 MCG/ACT nasal spray Place 2 sprays into both nostrils daily. 06/21/20   [provider]  gabapentin (NEURONTIN) 300 MG capsule Take 1 capsule (300 mg total) by mouth at bedtime. 09/25/20   Mercy Riding, MD  LANTUS SOLOSTAR 100 UNIT/ML Solostar Pen Inject 8 Units into the skin 2 (two) times daily. 09/25/20   Mercy Riding, MD  metFORMIN (GLUCOPHAGE-XR) 500 MG 24 hr tablet Take 500 mg by mouth daily with breakfast. 01/17/20   [provider]  metoprolol tartrate (LOPRESSOR) 25 MG tablet Take 0.5 tablets (12.5 mg total) by mouth 2 (two) times daily. 09/25/20   Mercy Riding, MD  ofloxacin (OCUFLOX) 0.3 % ophthalmic solution Place 1 drop into both eyes 4 (four) times daily as needed (eye irritation). 03/07/19   [provider]  ondansetron (ZOFRAN-ODT) 4 MG disintegrating tablet Take 4 mg by mouth every 8 (eight) hours as needed for nausea or vomiting. 02/07/20   [provider]  polyethylene glycol powder  (GLYCOLAX/MIRALAX) 17 GM/SCOOP powder Take 17 g by mouth daily.    [provider]  Rotigotine (NEUPRO) 8 MG/24HR PT24 Place 1 patch onto the skin every evening.    [provider]  Semaglutide,0.25 or 0.5MG /DOS, (OZEMPIC, 0.25 OR 0.5 MG/DOSE,) 2 MG/1.5ML SOPN Inject 0.5 mg into the skin every Tuesday.    [provider]  senna-docusate (SENOKOT-S) 8.6-50 MG tablet Take 1 tablet by mouth 2 (two) times daily as needed for moderate constipation. 09/25/20   Mercy Riding, MD  tadalafil (CIALIS) 5 MG tablet Take 5 mg by mouth daily. 04/13/20   [provider]  thiamine 100 MG tablet Take 1 tablet (100 mg total) by mouth daily. 09/26/20   Mercy Riding, MD    Allergies    Oxycodone, Iodinated diagnostic  agents, Iodine, Linezolid, Methadone hcl, Promethazine, Morphine sulfate, Other, Oxycodone hcl, Quetiapine, Statins, Zolpidem, Atorvastatin, Carbidopa-levodopa, Chlorhexidine gluconate [chlorhexidine], Clindamycin, Colesevelam, Doxazosin, Lovastatin, and Rosuvastatin  Review of Systems   Review of Systems  All other systems reviewed and are negative.  Physical Exam Updated Vital Signs BP (!) 149/94   Pulse (!) 108   Temp 97.7 F (36.5 C) (Oral)   Resp (!) 26   Ht 1.626 m (5\' 4" )   Wt 79.8 kg   SpO2 97%   BMI 30.20 kg/m   Physical Exam Vitals and nursing note reviewed.  Constitutional:      General: He is in acute distress.     Appearance: He is well-developed. He is diaphoretic.  HENT:     Head: Normocephalic and atraumatic.     Right Ear: External ear normal.     Left Ear: External ear normal.  Eyes:     General: No scleral icterus.       Right eye: No discharge.        Left eye: No discharge.     Conjunctiva/sclera: Conjunctivae normal.  Neck:     Trachea: No tracheal deviation.  Cardiovascular:     Rate and Rhythm: Regular rhythm. Tachycardia present.  Pulmonary:     Effort: Pulmonary effort is normal. No respiratory distress.     Breath  sounds: Normal breath sounds. No stridor. No wheezing or rales.  Abdominal:     General: Bowel sounds are normal. There is no distension.     Palpations: Abdomen is soft.     Tenderness: There is no abdominal tenderness. There is no guarding or rebound.  Musculoskeletal:        General: No deformity.     Cervical back: Neck supple.     Comments: Mild tenderness palpation bilateral knees, no effusion, no erythema or abrasions,   Skin:    General: Skin is warm.     Findings: No rash.  Neurological:     General: No focal deficit present.     Mental Status: He is alert and oriented to person, place, and time.     Cranial Nerves: No cranial nerve deficit (no facial droop, extraocular movements intact, no slurred speech).     Sensory: No sensory deficit.     Motor: No abnormal muscle tone or seizure activity.     Coordination: Coordination normal.     Comments: patient able to move bilateral upper extremities and lower extremities, sensation intact throughout  Psychiatric:        Mood and Affect: Mood is anxious.        Behavior: Behavior is agitated.    ED Results / Procedures / Treatments   Labs (all labs ordered are listed, but only abnormal results are displayed) Labs Reviewed  CBC - Abnormal; Notable for the following components:      Result Value   WBC 11.3 (*)    RBC 6.12 (*)    Hemoglobin 17.4 (*)    HCT 53.0 (*)    All other components within normal limits  BASIC METABOLIC PANEL - Abnormal; Notable for the following components:   CO2 20 (*)    Glucose, Bld 304 (*)    Creatinine, Ser 1.47 (*)    GFR, Estimated 47 (*)    Anion gap 16 (*)    All other components within normal limits  URINALYSIS, ROUTINE W REFLEX MICROSCOPIC - Abnormal; Notable for the following components:   APPearance CLOUDY (*)    Glucose, UA >=  500 (*)    Hgb urine dipstick LARGE (*)    Protein, ur 100 (*)    Leukocytes,Ua LARGE (*)    RBC / HPF >50 (*)    WBC, UA >50 (*)    Bacteria, UA MANY (*)     Non Squamous Epithelial 0-5 (*)    All other components within normal limits  CBG MONITORING, ED - Abnormal; Notable for the following components:   Glucose-Capillary 332 (*)    All other components within normal limits  RESP PANEL BY RT-PCR (FLU A&B, COVID) [id number]  URINE CULTURE    EKG EKG Interpretation  Date/Time:  Wednesday October 11 2020 20:05:35 EDT Ventricular Rate:  126 PR Interval:  107 QRS Duration: 87 QT Interval:  349 QTC Calculation: 506 R Axis:   65 Text Interpretation: Sinus tachycardia Probable left atrial enlargement Borderline T wave abnormalities Prolonged QT interval Baseline wander in lead(s) V3 Since last tracing rate faster Confirmed by Dorie Rank [phone]) on 10/11/2020 8:08:07 PM  Radiology DG Thoracic Spine 2 View  Result Date: 10/11/2020 CLINICAL DATA:  Back pain, fall EXAM: THORACIC SPINE 2 VIEWS COMPARISON:  None. FINDINGS: Thoracic alignment within normal limits. Vertebral body heights are maintained. Mild degenerative osteophytes. IMPRESSION: No acute osseous abnormality Electronically Signed   By: Donavan Foil M.D.   On: 10/11/2020 21:51   DG Lumbar Spine Complete  Result Date: 10/11/2020 CLINICAL DATA:  Back pain, fall EXAM: LUMBAR SPINE - COMPLETE 4+ VIEW COMPARISON:  10/06/2020 FINDINGS: Stable lumbar alignment. Trace anterolisthesis L4 on L5. Vertebral body heights are maintained. Moderate disc space narrowing and degenerative change at L5-S1. Aortic atherosclerosis. Oval calcification is seen in left upper quadrant on one view only IMPRESSION: No acute osseous abnormality Electronically Signed   By: Donavan Foil M.D.   On: 10/11/2020 21:50   DG Knee 2 Views Left  Result Date: 10/11/2020 CLINICAL DATA:  Fall, bilateral knee pain. EXAM: LEFT KNEE - 1-2 VIEW COMPARISON:  None. FINDINGS: No evidence of fracture, dislocation, or joint effusion. Normal joint spaces. Advanced vascular calcifications. Soft tissues are unremarkable. IMPRESSION: 1. No fracture  or subluxation of the left knee. 2. Advanced vascular calcifications. Electronically Signed   By: Keith Rake M.D.   On: 10/11/2020 20:44   DG Knee 2 Views Right  Result Date: 10/11/2020 CLINICAL DATA:  Post fall with bilateral knee pain. EXAM: RIGHT KNEE - 1-2 VIEW COMPARISON:  None. FINDINGS: No evidence of fracture, dislocation, or joint effusion. Normal joint spaces. Prominent vascular calcifications. Soft tissues are unremarkable. IMPRESSION: 1. No fracture or subluxation of the right knee. 2. Prominent vascular calcifications. Electronically Signed   By: Keith Rake M.D.   On: 10/11/2020 20:43   CT Head Wo Contrast  Result Date: 10/11/2020 CLINICAL DATA:  Fall EXAM: CT HEAD WITHOUT CONTRAST CT CERVICAL SPINE WITHOUT CONTRAST TECHNIQUE: Multidetector CT imaging of the head and cervical spine was performed following the standard protocol without intravenous contrast. Multiplanar CT image reconstructions of the cervical spine were also generated. COMPARISON:  10/06/2020 head CT and cervical spine CT FINDINGS: CT HEAD FINDINGS Brain: There is no mass, hemorrhage or extra-axial collection. There is generalized atrophy without lobar predilection. There is hypoattenuation of the periventricular white matter, most commonly indicating chronic ischemic microangiopathy. Vascular: Atherosclerotic calcification of the internal carotid arteries at the skull base. No abnormal hyperdensity of the major intracranial arteries or dural venous sinuses. Skull: The visualized skull base, calvarium and extracranial soft tissues are normal. Sinuses/Orbits: No fluid levels  or advanced mucosal thickening of the visualized paranasal sinuses. No mastoid or middle ear effusion. The orbits are normal. CT CERVICAL SPINE FINDINGS Cervical spine imaging is severely degraded by motion. Within that limitation, there is no acute fracture. Alignment is grossly normal. IMPRESSION: 1. Chronic ischemic microangiopathy and generalized  atrophy without acute intracranial abnormality. 2. Cervical spine imaging is severely degraded by motion. Within that limitation, there is no acute fracture or static subluxation of the cervical spine. Electronically Signed   By: Ulyses Jarred M.D.   On: 10/11/2020 21:10   CT Cervical Spine Wo Contrast  Result Date: 10/11/2020 CLINICAL DATA:  Fall EXAM: CT HEAD WITHOUT CONTRAST CT CERVICAL SPINE WITHOUT CONTRAST TECHNIQUE: Multidetector CT imaging of the head and cervical spine was performed following the standard protocol without intravenous contrast. Multiplanar CT image reconstructions of the cervical spine were also generated. COMPARISON:  10/06/2020 head CT and cervical spine CT FINDINGS: CT HEAD FINDINGS Brain: There is no mass, hemorrhage or extra-axial collection. There is generalized atrophy without lobar predilection. There is hypoattenuation of the periventricular white matter, most commonly indicating chronic ischemic microangiopathy. Vascular: Atherosclerotic calcification of the internal carotid arteries at the skull base. No abnormal hyperdensity of the major intracranial arteries or dural venous sinuses. Skull: The visualized skull base, calvarium and extracranial soft tissues are normal. Sinuses/Orbits: No fluid levels or advanced mucosal thickening of the visualized paranasal sinuses. No mastoid or middle ear effusion. The orbits are normal. CT CERVICAL SPINE FINDINGS Cervical spine imaging is severely degraded by motion. Within that limitation, there is no acute fracture. Alignment is grossly normal. IMPRESSION: 1. Chronic ischemic microangiopathy and generalized atrophy without acute intracranial abnormality. 2. Cervical spine imaging is severely degraded by motion. Within that limitation, there is no acute fracture or static subluxation of the cervical spine. Electronically Signed   By: Ulyses Jarred M.D.   On: 10/11/2020 21:10   DG Chest Portable 1 View  Result Date: 10/11/2020 CLINICAL  DATA:  Fall EXAM: PORTABLE CHEST 1 VIEW COMPARISON:  09/16/2020, CT 12/21/2019, radiograph 05/02/2016 FINDINGS: Diminished lung volumes with elevation of right diaphragm. Streaky atelectasis at the right base. No consolidation, pleural effusion or pneumothorax. Stable cardiomediastinal silhouette. IMPRESSION: Low lung volumes with elevated right diaphragm and subsegmental atelectasis at the right base. Electronically Signed   By: Donavan Foil M.D.   On: 10/11/2020 21:26   DG HIP UNILAT WITH PELVIS 2-3 VIEWS LEFT  Result Date: 10/11/2020 CLINICAL DATA:  Fall on blood thinners. EXAM: DG HIP (WITH OR WITHOUT PELVIS) 2-3V LEFT COMPARISON:  Abdomen 01/11/2015 FINDINGS: Catheter projected over the midline likely representing a Foley catheter. Pelvis and left hip appear intact. No acute displaced fractures identified. Vascular calcifications. SI joints and symphysis pubis are not displaced. IMPRESSION: No acute displaced fractures identified. Electronically Signed   By: Lucienne Capers M.D.   On: 10/11/2020 20:48   DG HIP UNILAT WITH PELVIS 2-3 VIEWS RIGHT  Result Date: 10/11/2020 CLINICAL DATA:  [place] on blood thinners. EXAM: DG HIP (WITH OR WITHOUT PELVIS) 2-3V RIGHT COMPARISON:  10/06/2020 FINDINGS: Foley catheter. Right hip appears intact. No evidence of acute fracture or dislocation. No focal bone lesion or bone destruction. Mild degenerative changes in the hips. Vascular calcifications. IMPRESSION: No acute displaced fractures identified. Electronically Signed   By: Lucienne Capers M.D.   On: 10/11/2020 20:49    Procedures .Critical Care  Date/Time: 10/11/2020 11:47 PM Performed by: Dorie Rank, MD Authorized by: Dorie Rank, MD   Critical care provider  statement:    Critical care time (minutes):  45   Critical care was time spent personally by me on the following activities:  Discussions with consultants, evaluation of patient's response to treatment, examination of patient, ordering and performing  treatments and interventions, ordering and review of laboratory studies, ordering and review of radiographic studies, pulse oximetry, re-evaluation of patient's condition, obtaining history from patient or surrogate and review of old charts   Medications Ordered in ED Medications  cefTRIAXone (ROCEPHIN) 1 g in sodium chloride 0.9 % 100 mL IVPB (has no administration in time range)  LORazepam (ATIVAN) injection 1 mg (1 mg Intravenous Given 10/11/20 2022)  lactated ringers bolus 1,000 mL (0 mLs Intravenous Stopped 10/11/20 2239)  metoprolol tartrate (LOPRESSOR) injection 5 mg (5 mg Intravenous Given 10/11/20 2110)  fentaNYL (SUBLIMAZE) injection 75 mcg (75 mcg Intravenous Given 10/11/20 2112)  LORazepam (ATIVAN) injection 1 mg (1 mg Intravenous Given 10/11/20 2223)    ED Course  I have reviewed the triage vital signs and the nursing notes.  Pertinent labs & imaging results that were available during my care of the patient were reviewed by me and considered in my medical decision making (see chart for details).  Clinical Course as of 10/11/20 2344  Wed Oct 11, 2020  2054 Hemoglobin is increased compared to previous. [JK]  2054 Blood sugar elevated at 304 [JK]  2104 Patient still diaphoretic and hypertensive.  Metoprolol ordered.  We will give a dose of pain meds [JK]  2106 Knee x-rays without fracture [JK]  2118 Now complaining of his back hurting. [JK]  2122 CT and C-spine CT without acute findings [JK]  2318 X-rays and CT scans without acute abnormalities [JK]  2318 Vital signs are improving.  Blood pressure and heart rate decreased [JK]  2339 Urinalysis does show many bacteria.  Patient remains confused.  Presentation concerning for urinary tract infection and agitated delirium associated with that infection.  I will consult the medical service [JK]    Clinical Course User Index [JK] Dorie Rank, MD   MDM Rules/Calculators/A&P                          Patient scented to the ED for evaluation  of a fall.  Patient was also notably confused here in the emergency room.  Patient was initially diaphoretic and agitated.  He was given medications for pain and sedation.  Patient has improved but he does remain confused.  Patient did have a component of hemoconcentration.  X-rays did not show any signs of any acute injuries.  Patient has a chronic indwelling Foley but his urinalysis does suggest the possibility of infection.  With his confusion and change in mental status I will consult the medical service for admission. Final Clinical Impression(s) / ED Diagnoses Final diagnoses:  Fall, initial encounter  Acute cystitis without hematuria     Dorie Rank, MD 10/11/20 2347

## 2020-10-11 NOTE — ED Notes (Signed)
Patient transported to CT 

## 2020-10-11 NOTE — ED Notes (Signed)
Pt still agitated. Pulling off wires. Saying he wants to get up. Pt pulled out IV

## 2020-10-11 NOTE — ED Notes (Signed)
Trauma Response Nurse Note-  Reason for Call / Reason for Trauma activation:   -Fall on Blood thinners, level 2 trauma activation  Initial Focused Assessment (If applicable, or please see trauma documentation):  - Pt is agitated and moving around in bed. Pt noted to be diaphoretic. Pt is speaking and appears to be altered (unknown baseline).  Interventions:  -Pt his back from CT and has had x-rays completed. X-ray back at bedside   Event Summary:   -pt came in as a level 2 trauma. Pt back from CT. Medications given by primary RN (please see MAR). Pt noted to be anxious and moving in the bed. Pt noted to have foley catheter in place. Foley bag was exchanged as per MD.

## 2020-10-11 NOTE — ED Notes (Signed)
Assume care of this pt. Pt very confuse and attempting to get out of bed, here for falling. Pt continues to take the monitor out of him and IV.

## 2020-10-11 NOTE — Progress Notes (Signed)
Orthopedic Tech Progress Note Patient Details:  Jason Hartman Sep 02, 1935 [id number] Level 2 trauma Patient ID: [name], male   DOB: 01-19-1936, 85 y.o.   MRN: [id number]  [name] 10/11/2020, 8:12 PM

## 2020-10-11 NOTE — ED Triage Notes (Addendum)
Pt presents to ED BIB [facility] from [facility]. Pt reports unwitnessed fall in SNF. Pt reports hitting his head. Pt anxious upon arrival. Pt on blood thinner. No LOC. Compliant of pain to L head.  EMS VS -  160 palp HR - 110-120

## 2020-10-11 NOTE — Progress Notes (Signed)
   10/11/20 2003  Clinical Encounter Type  Visited With Patient not available  Visit Type Trauma  Referral From Nurse  Consult/Referral To Chaplain   Chaplain spoke with unit secretary Sarah. Chaplain is not needed at this time. Chaplain remains available. This note was prepared by Jeanine Luz, M.Div..  For questions please contact by phone [phone].

## 2020-10-11 NOTE — ED Notes (Signed)
Mom calling to discuss isolation guidelines for COVID-19.  Discussed recommendations per CDC.  Mom was concerned daycare was asking for testing after 10 day isolation period.  RN explained, it was not necessary, but daycare could have their own guidelines.  Mom will review CDC guidelines on website and talk with daycare.

## 2020-10-11 NOTE — ED Notes (Signed)
Wife Hayze Gazda [phone] would like an update

## 2020-10-12 ENCOUNTER — Inpatient Hospital Stay (HOSPITAL_COMMUNITY): Payer: Medicare Other

## 2020-10-12 DIAGNOSIS — R4182 Altered mental status, unspecified: Secondary | ICD-10-CM

## 2020-10-12 DIAGNOSIS — A419 Sepsis, unspecified organism: Secondary | ICD-10-CM | POA: Diagnosis present

## 2020-10-12 LAB — CBG MONITORING, ED
Glucose-Capillary: 182 mg/dL — ABNORMAL HIGH (ref 70–99)
Glucose-Capillary: 216 mg/dL — ABNORMAL HIGH (ref 70–99)
Glucose-Capillary: 232 mg/dL — ABNORMAL HIGH (ref 70–99)
Glucose-Capillary: 275 mg/dL — ABNORMAL HIGH (ref 70–99)

## 2020-10-12 LAB — BASIC METABOLIC PANEL
Anion gap: 12 (ref 5–15)
BUN: 22 mg/dL (ref 8–23)
CO2: 22 mmol/L (ref 22–32)
Calcium: 9.2 mg/dL (ref 8.9–10.3)
Chloride: 105 mmol/L (ref 98–111)
Creatinine, Ser: 1.23 mg/dL (ref 0.61–1.24)
GFR, Estimated: 59 mL/min — ABNORMAL LOW (ref >60)
Glucose, Bld: 191 mg/dL — ABNORMAL HIGH (ref 70–99)
Potassium: 4.4 mmol/L (ref 3.5–5.1)
Sodium: 139 mmol/L (ref 135–145)

## 2020-10-12 LAB — CBC
HCT: 48.5 % (ref 39.0–52.0)
Hemoglobin: 15.6 g/dL (ref 13.0–17.0)
MCH: 27.9 pg (ref 26.0–34.0)
MCHC: 32.2 g/dL (ref 30.0–36.0)
MCV: 86.8 fL (ref 80.0–100.0)
Platelets: 232 10*3/uL (ref 150–400)
RBC: 5.59 MIL/uL (ref 4.22–5.81)
RDW: 14.3 % (ref 11.5–15.5)
WBC: 13.2 10*3/uL — ABNORMAL HIGH (ref 4.0–10.5)
nRBC: 0 % (ref 0.0–0.2)

## 2020-10-12 LAB — RESP PANEL BY RT-PCR (FLU A&B, COVID) ARPGX2
Influenza A by PCR: NEGATIVE
Influenza B by PCR: NEGATIVE
SARS Coronavirus 2 by RT PCR: NEGATIVE

## 2020-10-12 LAB — HEMOGLOBIN A1C
Hgb A1c MFr Bld: 7.1 % — ABNORMAL HIGH (ref 4.8–5.6)
Mean Plasma Glucose: 157.07 mg/dL

## 2020-10-12 LAB — MAGNESIUM: Magnesium: 1.7 mg/dL (ref 1.7–2.4)

## 2020-10-12 LAB — PHOSPHORUS: Phosphorus: 3.7 mg/dL (ref 2.5–4.6)

## 2020-10-12 LAB — GLUCOSE, CAPILLARY: Glucose-Capillary: 139 mg/dL — ABNORMAL HIGH (ref 70–99)

## 2020-10-12 IMAGING — CT CT RENAL STONE PROTOCOL
2 of 4 series · 16 of 46 positions shown, 18 images · non-contrast
Comparison: [DATE]

CLINICAL DATA: Hematuria

EXAM:
CT ABDOMEN AND PELVIS WITHOUT CONTRAST
TECHNIQUE: Multidetector CT imaging of the abdomen and pelvis was performed
following the standard protocol without IV contrast.

[Series 3: renal stone 5.0 · axial · 0.79mm/px · z∈[+523,+1028]mm · 13 of 111 slices shown, 15 images]
[im 5/111  soft-tissue]
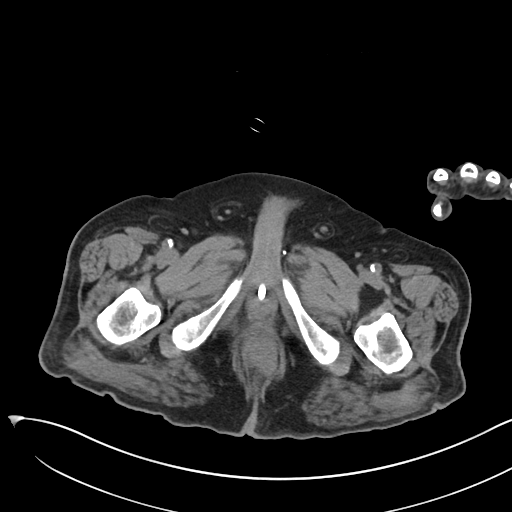
[im 5/111  bone]
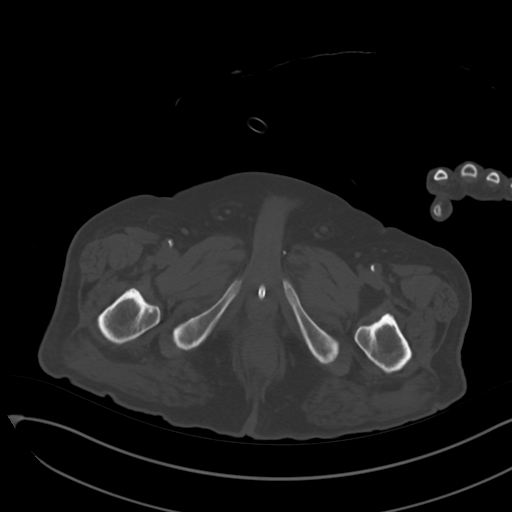
[im 14/111  soft-tissue]
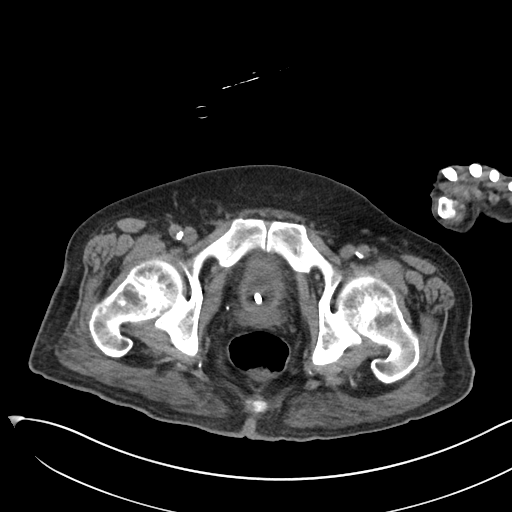
[im 23/111  soft-tissue]
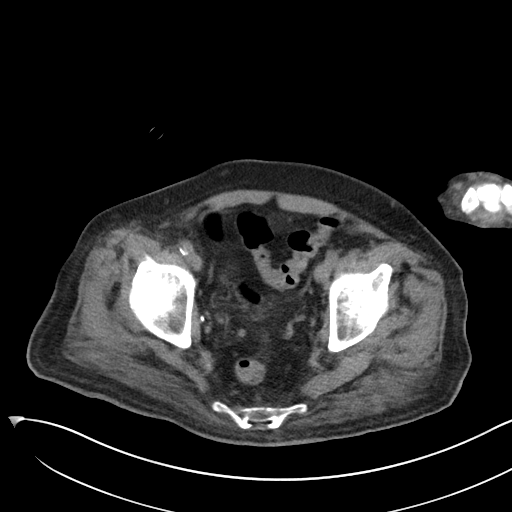
[im 33/111  soft-tissue]
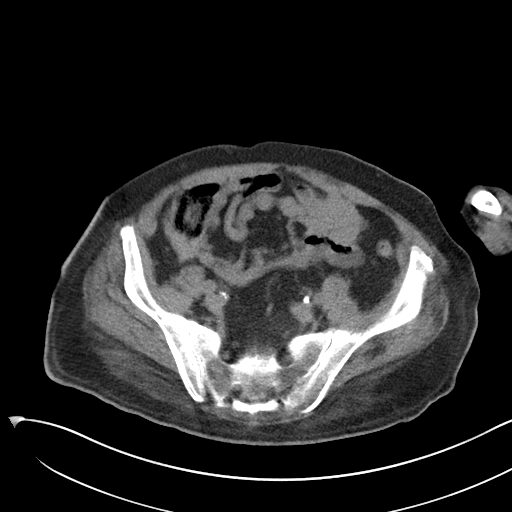
[im 37/111  soft-tissue]
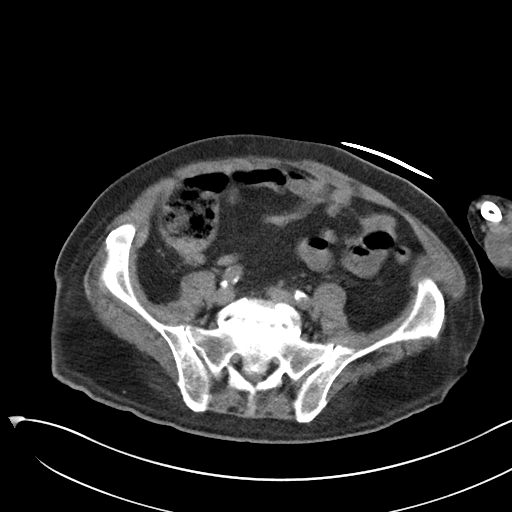
[im 46/111  soft-tissue]
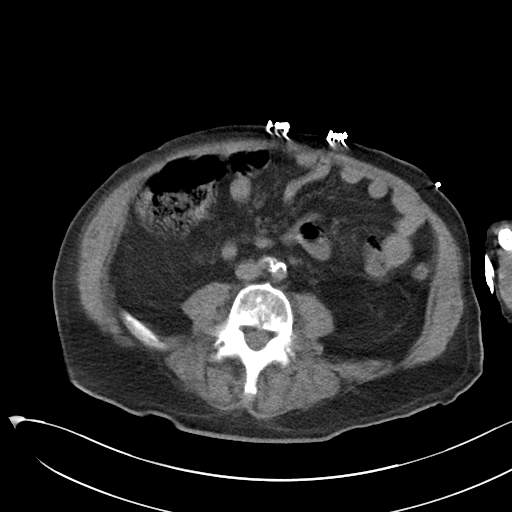
[im 56/111  soft-tissue]
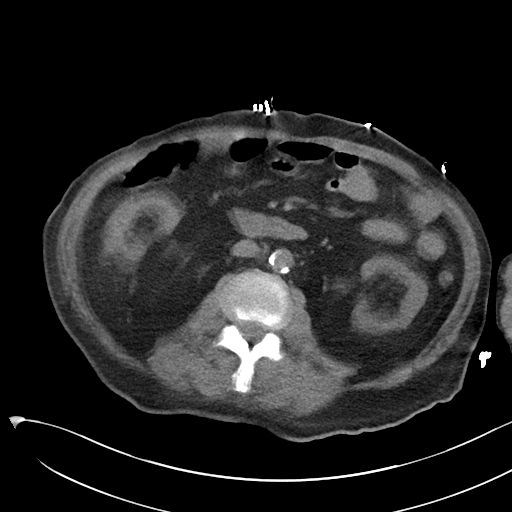
[im 65/111  soft-tissue]
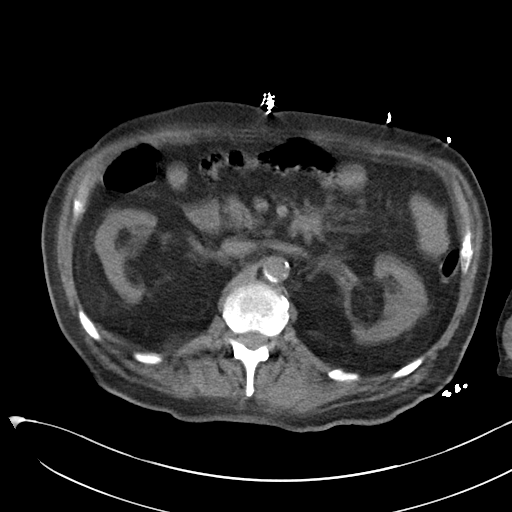
[im 74/111  soft-tissue]
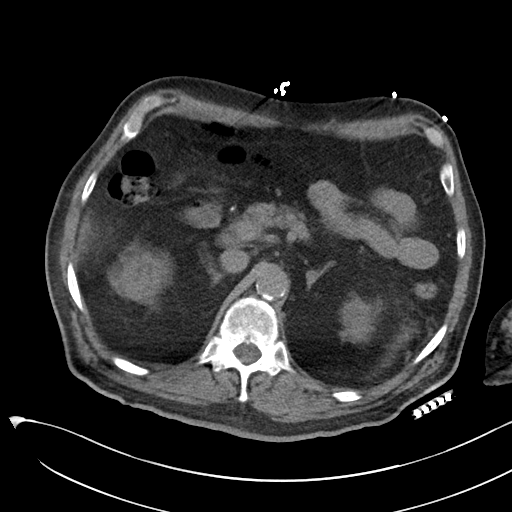
[im 74/111  bone]
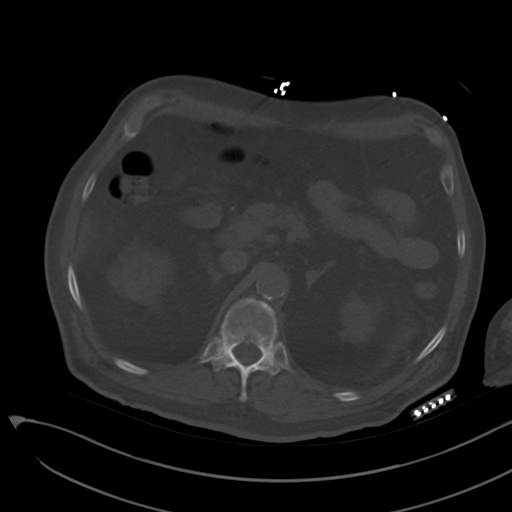
[im 78/111  soft-tissue]
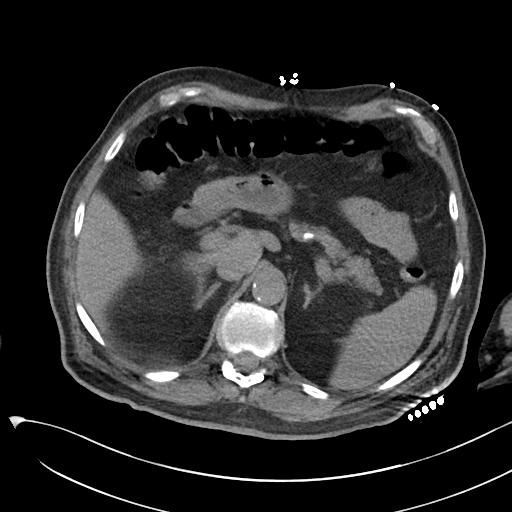
[im 88/111  soft-tissue]
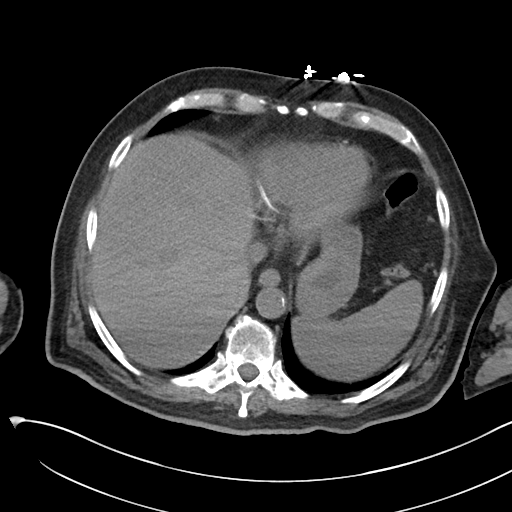
[im 97/111  soft-tissue]
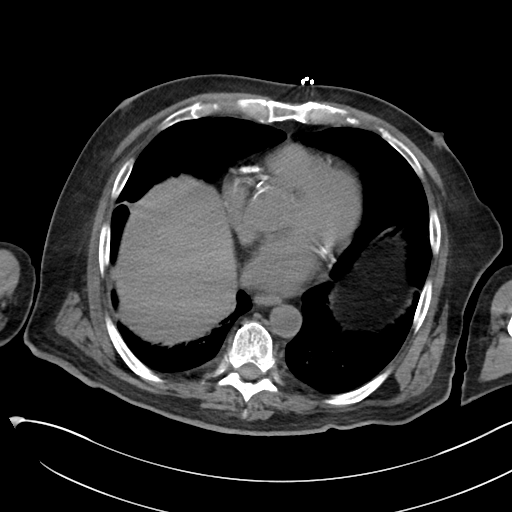
[im 106/111  soft-tissue]
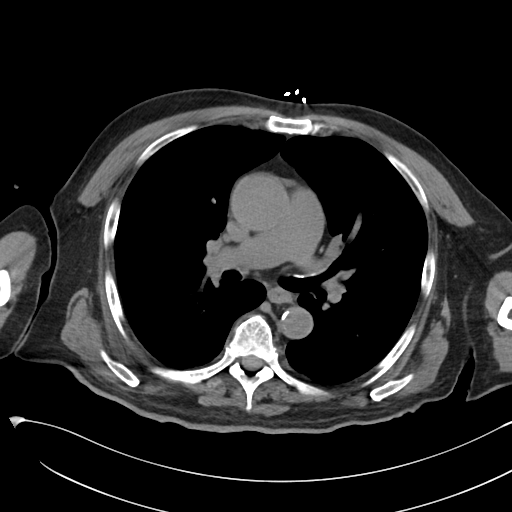

[Series 6: cor · coronal · 0.81mm/px · 3 of 151 slices shown]
[im 51/151  soft-tissue]
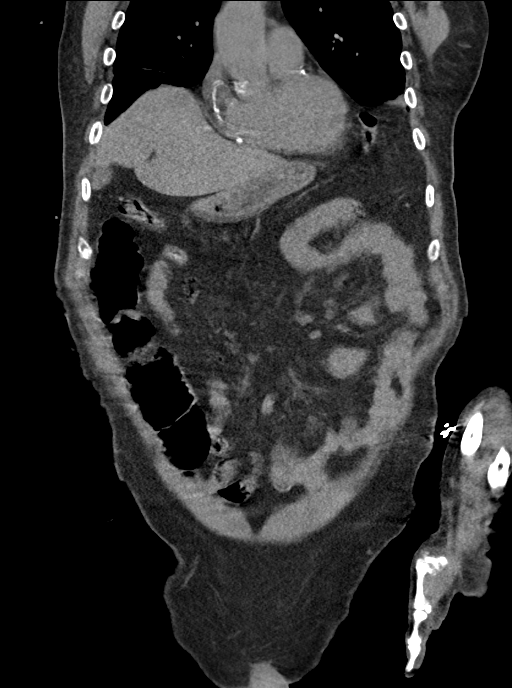
[im 67/151  soft-tissue]
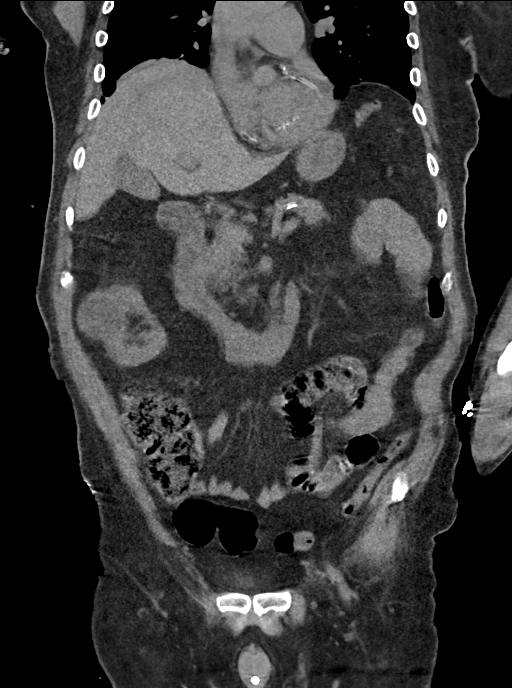
[im 84/151  soft-tissue]
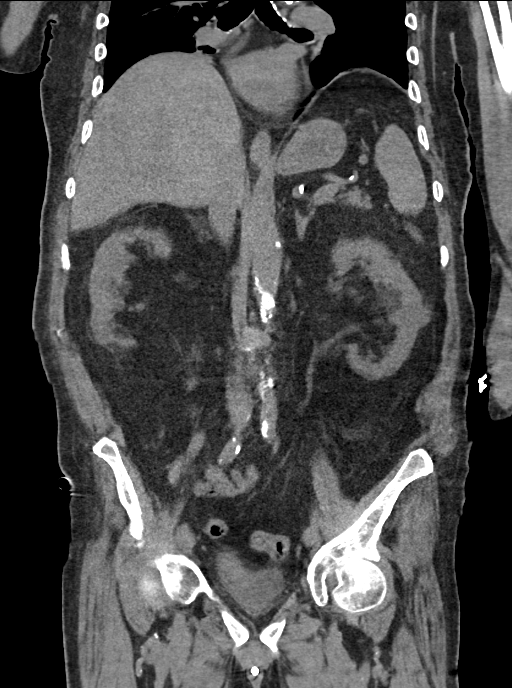

[16 of 46 positions shown; findings below may reference images not displayed]

FINDINGS: Motion degraded images.

Lower chest: Mild subpleural reticulation in the lungs bilaterally,
likely post infectious/inflammatory.

Hepatobiliary: Unenhanced liver is unremarkable.

Gallbladder is unremarkable. No intrahepatic or extrahepatic ductal
dilatation.

Pancreas: Within normal limits.

Spleen: Within normal limits.

Adrenals/Urinary Tract: Adrenal glands are within normal limits.

Two nonobstructing right upper pole renal calculi measuring up to 2
mm (series 3/image 43), unchanged. Additional bilateral renal cysts,
including a 16 mm hyperdense probable cyst in the lateral interpolar
left kidney (series 3/image 52), poorly evaluated on unenhanced CT.
No hydronephrosis.

Bladder is decompressed by an indwelling Foley catheter.

Stomach/Bowel: Stomach is within normal limits.

No evidence of bowel obstruction.

Normal appendix (series 3/image 70).

Mild sigmoid diverticulosis, without evidence of diverticulitis.

Vascular/Lymphatic: No evidence of abdominal aortic aneurysm.

Atherosclerotic calcifications of the abdominal aorta and branch
vessels.

No suspicious abdominopelvic lymphadenopathy.

Reproductive: Prostate is unremarkable.

Other: No abdominopelvic ascites.

Musculoskeletal: Degenerative changes of the visualized
thoracolumbar spine.
IMPRESSION: Motion degraded images.

Two nonobstructing right upper pole renal calculi measuring up to 2
mm, unchanged. No hydronephrosis.

Bilateral renal cysts, poorly evaluated, including a 16 mm
hyperdense probable cyst in the lateral interpolar left kidney. This
previously measured 10 mm in [GP], supporting a benign etiology.

Bladder decompressed by indwelling Foley catheter.

## 2020-10-12 MED ORDER — INSULIN ASPART 100 UNIT/ML IJ SOLN
0.0000 [IU] | Freq: Every day | INTRAMUSCULAR | Status: DC
  Administered 2020-10-12: 2 [IU] via SUBCUTANEOUS

## 2020-10-12 MED ORDER — DIPHENHYDRAMINE HCL 50 MG/ML IJ SOLN
25.0000 mg | Freq: Once | INTRAMUSCULAR | Status: AC
  Administered 2020-10-12: 25 mg via INTRAVENOUS
  Filled 2020-10-12: qty 1

## 2020-10-12 MED ORDER — DULOXETINE HCL 30 MG PO CPEP
30.0000 mg | ORAL_CAPSULE | Freq: Every day | ORAL | Status: DC
  Administered 2020-10-12 – 2020-10-14 (×3): 30 mg via ORAL
  Filled 2020-10-12 (×3): qty 1

## 2020-10-12 MED ORDER — GABAPENTIN 300 MG PO CAPS
300.0000 mg | ORAL_CAPSULE | Freq: Every day | ORAL | Status: DC
  Administered 2020-10-12 – 2020-10-15 (×4): 300 mg via ORAL
  Filled 2020-10-12 (×4): qty 1

## 2020-10-12 MED ORDER — METOPROLOL TARTRATE 12.5 MG HALF TABLET
12.5000 mg | ORAL_TABLET | Freq: Two times a day (BID) | ORAL | Status: DC
  Administered 2020-10-12 – 2020-10-16 (×9): 12.5 mg via ORAL
  Filled 2020-10-12 (×9): qty 1

## 2020-10-12 MED ORDER — POLYETHYLENE GLYCOL 3350 17 G PO PACK
17.0000 g | PACK | Freq: Every day | ORAL | Status: DC | PRN
  Administered 2020-10-13 – 2020-10-15 (×3): 17 g via ORAL
  Filled 2020-10-12 (×3): qty 1

## 2020-10-12 MED ORDER — TRAMADOL HCL 50 MG PO TABS: 50.0000 mg | ORAL_TABLET | Freq: Every day | ORAL | Status: DC

## 2020-10-12 MED ORDER — AMIODARONE HCL 200 MG PO TABS
200.0000 mg | ORAL_TABLET | Freq: Every day | ORAL | Status: DC
  Administered 2020-10-12 – 2020-10-16 (×5): 200 mg via ORAL
  Filled 2020-10-12 (×5): qty 1

## 2020-10-12 MED ORDER — ROTIGOTINE 8 MG/24HR TD PT24
1.0000 | MEDICATED_PATCH | Freq: Every evening | TRANSDERMAL | Status: DC
  Administered 2020-10-12 – 2020-10-16 (×5): 1 via TRANSDERMAL
  Filled 2020-10-12 (×5): qty 1

## 2020-10-12 MED ORDER — LACTATED RINGERS IV SOLN
INTRAVENOUS | Status: AC
  Administered 2020-10-12: 50 mL/h via INTRAVENOUS

## 2020-10-12 MED ORDER — SODIUM CHLORIDE 0.9 % IV SOLN
2.0000 g | INTRAVENOUS | Status: DC
  Administered 2020-10-12 – 2020-10-13 (×2): 2 g via INTRAVENOUS
  Filled 2020-10-12: qty 2
  Filled 2020-10-12 (×2): qty 20

## 2020-10-12 MED ORDER — THIAMINE HCL 100 MG PO TABS
100.0000 mg | ORAL_TABLET | Freq: Every day | ORAL | Status: DC
  Administered 2020-10-12 – 2020-10-16 (×5): 100 mg via ORAL
  Filled 2020-10-12 (×5): qty 1

## 2020-10-12 MED ORDER — PROCHLORPERAZINE EDISYLATE 10 MG/2ML IJ SOLN: 10.0000 mg | INTRAMUSCULAR | Status: AC | PRN

## 2020-10-12 MED ORDER — TRAMADOL HCL 50 MG PO TABS
50.0000 mg | ORAL_TABLET | Freq: Four times a day (QID) | ORAL | Status: DC | PRN
  Administered 2020-10-13 – 2020-10-15 (×9): 50 mg via ORAL
  Filled 2020-10-12 (×10): qty 1

## 2020-10-12 MED ORDER — AMITRIPTYLINE HCL 10 MG PO TABS
10.0000 mg | ORAL_TABLET | Freq: Every day | ORAL | Status: DC
  Filled 2020-10-12: qty 1

## 2020-10-12 MED ORDER — TADALAFIL 5 MG PO TABS
5.0000 mg | ORAL_TABLET | Freq: Every day | ORAL | Status: DC
  Administered 2020-10-13 – 2020-10-16 (×4): 5 mg via ORAL
  Filled 2020-10-12 (×5): qty 1

## 2020-10-12 MED ORDER — ACETAMINOPHEN 325 MG PO TABS
650.0000 mg | ORAL_TABLET | Freq: Four times a day (QID) | ORAL | Status: DC | PRN
  Administered 2020-10-15 – 2020-10-16 (×2): 650 mg via ORAL
  Filled 2020-10-12 (×2): qty 2

## 2020-10-12 MED ORDER — PREDNISONE 20 MG PO TABS
40.0000 mg | ORAL_TABLET | Freq: Every day | ORAL | Status: DC
  Administered 2020-10-12: 40 mg via ORAL
  Filled 2020-10-12: qty 2

## 2020-10-12 MED ORDER — KETOROLAC TROMETHAMINE 15 MG/ML IJ SOLN
7.5000 mg | Freq: Once | INTRAMUSCULAR | Status: AC
  Administered 2020-10-12: 7.5 mg via INTRAVENOUS
  Filled 2020-10-12: qty 1

## 2020-10-12 MED ORDER — FERROUS SULFATE 325 (65 FE) MG PO TABS
325.0000 mg | ORAL_TABLET | Freq: Every day | ORAL | Status: DC
  Administered 2020-10-12 – 2020-10-16 (×5): 325 mg via ORAL
  Filled 2020-10-12 (×5): qty 1

## 2020-10-12 MED ORDER — INSULIN ASPART 100 UNIT/ML IJ SOLN
0.0000 [IU] | Freq: Three times a day (TID) | INTRAMUSCULAR | Status: DC
Start: 2020-10-12 — End: 2020-10-12
  Administered 2020-10-12: 3 [IU] via SUBCUTANEOUS
  Administered 2020-10-12: 5 [IU] via SUBCUTANEOUS
  Administered 2020-10-12: 2 [IU] via SUBCUTANEOUS

## 2020-10-12 MED ORDER — MELATONIN 3 MG PO TABS
3.0000 mg | ORAL_TABLET | Freq: Every evening | ORAL | Status: DC | PRN
  Administered 2020-10-13 – 2020-10-15 (×3): 3 mg via ORAL
  Filled 2020-10-12 (×3): qty 1

## 2020-10-12 MED ORDER — OXYCODONE HCL 5 MG PO TABS: 5.0000 mg | ORAL_TABLET | Freq: Four times a day (QID) | ORAL | Status: DC | PRN

## 2020-10-12 MED ORDER — DIAZEPAM 5 MG/ML IJ SOLN
5.0000 mg | Freq: Once | INTRAMUSCULAR | Status: AC
  Administered 2020-10-12: 5 mg via INTRAVENOUS
  Filled 2020-10-12: qty 2

## 2020-10-12 MED ORDER — LORAZEPAM 1 MG PO TABS
0.5000 mg | ORAL_TABLET | Freq: Three times a day (TID) | ORAL | Status: DC | PRN
  Administered 2020-10-12: 0.5 mg via ORAL
  Filled 2020-10-12: qty 1

## 2020-10-12 MED ORDER — LORAZEPAM 2 MG/ML IJ SOLN
2.0000 mg | Freq: Once | INTRAMUSCULAR | Status: AC
  Administered 2020-10-12: 2 mg via INTRAVENOUS
  Filled 2020-10-12: qty 1

## 2020-10-12 MED ORDER — APIXABAN 5 MG PO TABS
5.0000 mg | ORAL_TABLET | Freq: Two times a day (BID) | ORAL | Status: DC
  Administered 2020-10-12 – 2020-10-16 (×9): 5 mg via ORAL
  Filled 2020-10-12 (×9): qty 1

## 2020-10-12 MED ORDER — INSULIN ASPART 100 UNIT/ML IJ SOLN
0.0000 [IU] | Freq: Three times a day (TID) | INTRAMUSCULAR | Status: DC
  Administered 2020-10-13: 11 [IU] via SUBCUTANEOUS

## 2020-10-12 MED ORDER — SODIUM CHLORIDE 0.9 % IV SOLN
1.0000 g | Freq: Four times a day (QID) | INTRAVENOUS | Status: DC
  Administered 2020-10-12 – 2020-10-13 (×4): 1 g via INTRAVENOUS
  Filled 2020-10-12 (×8): qty 1000

## 2020-10-12 NOTE — TOC CAGE-AID Note (Signed)
Transition of Care [facility]) - CAGE-AID Screening   Patient Details  Name: Jason Hartman MRN: [id number] Date of Birth: Apr 02, 1936  [facility]) CM/SW Contact:    Freeland Pracht C Tarpley-Carter, [place] Phone Number: 10/12/2020, 10:18 AM   Clinical Narrative: Pt is unable to participate in Cage Aid (Altered Mental Status).   Mayetta Castleman Tarpley-Carter, MSW, LCSW-A Pronouns:  She/Her/Hers                          [place] Clinical Social WorkerTransitions of Care Cell:  [phone] [email or web address]    CAGE-AID Screening: Substance Abuse Screening unable to be completed due to: : Patient unable to participate ((Altered Mental Status))

## 2020-10-12 NOTE — Progress Notes (Signed)
PT Cancellation Note  Patient Details Name: Jason Hartman MRN: [id number] DOB: Mar 22, 1936   Cancelled Treatment:    Reason Eval/Treat Not Completed: Medical issues which prohibited therapy.  Pt restless in bed with legs hurting.  Just received pain medication.  Request PT to return tomorrow. Abran Richard, PT Acute Rehab Services Pager [phone] [facility] [place] 10/12/2020, 5:51 PM

## 2020-10-12 NOTE — ED Notes (Signed)
Pt Covid swapped x 2 still pending results.

## 2020-10-12 NOTE — ED Notes (Signed)
Daughter updated on plan of admission.

## 2020-10-12 NOTE — ED Notes (Signed)
Changed pt linen & gown

## 2020-10-12 NOTE — Progress Notes (Signed)
PROGRESS NOTE    Jason Hartman  QAS:[id number] DOB: 22-May-1935 DOA: 10/11/2020 PCP: [name], MD      Brief Narrative:  Jason Hartman is a 85 y.o. M with Parkinson's on Rotigotine, bad RLS, anxiety, pAF not on AC, OSA, DM, CKD IIIb, and chronic pain as well as urethral stricutre who presented with confusion.  Admitted 6/3 for one week for UTI with sepsis and delirium.  Had new pAF during that hospital stay, started on amio, had bleeding complications from penis so AC held.   Urology consulted, dilated urethra and placed foley.  Discharged with indwelling foley.  Treated for enterecoccus with Unasyn.  Since then, has been in rehab.  [facility] numerous times.  Finally, fell again today, was noted in the ER to be tachycardic, and persistently very confused.      Assessment & Plan:  Sepsis due to UTI -Continue rocephin, add apmicillin for recent enterococcus   Acute metabolic encephalopathy Delirious, remains confused  Parkinson's disease -Continue Rotigotine patch  Urethral stricutre Maintain foley -Outpatient urology follow up  Atrial flutter -Continue apixaban -Continue amiodarone  Diabetes -Continue SS corrections Glucoses elevated  CKD IIIb with AKI 1.1 to 1.5  RLS  Chronic pain syndrome          Disposition: Status is: Inpatient  Remains inpatient appropriate because:IV treatments appropriate due to intensity of illness or inability to take PO  Dispo:  Patient From: [place]  Planned Disposition: [place]  Medically stable for discharge: No                MDM: This is a no charge note.  For further details, please see H&P by my partner Dr. Nevada Crane from earlier today.  The below labs and imaging reports were reviewed and summarized above.    DVT prophylaxis: SCDs Start: 10/12/20 0004 apixaban (ELIQUIS) tablet 5 mg  Code Status: FULL Family Communication: wife            Subjective: Still  confusedc        Objective: Vitals:   10/11/20 2302 10/12/20 0115 10/12/20 0415 10/12/20 0700  BP: (!) 149/94 (!) 149/96 (!) 200/105 (!) 155/125  Pulse: (!) 108 (!) 105 (!) 107 (!) 112  Resp: (!) 26 17 16  (!) 22  Temp:      TempSrc:      SpO2: 97% 96% 96% 95%  Weight:      Height:        Intake/Output Summary (Last 24 hours) at 10/12/2020 0849 Last data filed at 10/12/2020 0215 Gross per 24 hour  Intake 100 ml  Output 0 ml  Net 100 ml   Filed Weights   10/11/20 2145  Weight: 79.8 kg    Examination: The patient was seen and examined.      Data Reviewed: I have personally reviewed following labs and imaging studies:  CBC: Recent Labs  Lab 10/11/20 2006 10/12/20 0331  WBC 11.3* 13.2*  HGB 17.4* 15.6  HCT 53.0* 48.5  MCV 86.6 86.8  PLT 251 798   Basic Metabolic Panel: Recent Labs  Lab 10/11/20 2006 10/12/20 0331  NA 135 139  K 4.7 4.4  CL 99 105  CO2 20* 22  GLUCOSE 304* 191*  BUN 22 22  CREATININE 1.47* 1.23  CALCIUM 9.8 9.2  MG  --  1.7  PHOS  --  3.7   GFR: Estimated Creatinine Clearance: 44.1 mL/min (by C-G formula based on SCr of 1.23 mg/dL). Liver Function Tests:  No results for input(s): AST, ALT, ALKPHOS, BILITOT, PROT, ALBUMIN in the last 168 hours. No results for input(s): LIPASE, AMYLASE in the last 168 hours. No results for input(s): AMMONIA in the last 168 hours. Coagulation Profile: No results for input(s): INR, PROTIME in the last 168 hours. Cardiac Enzymes: No results for input(s): CKTOTAL, CKMB, CKMBINDEX, TROPONINI in the last 168 hours. BNP (last 3 results) No results for input(s): PROBNP in the last 8760 hours. HbA1C: Recent Labs    10/12/20 0332  HGBA1C 7.1*   CBG: Recent Labs  Lab 10/11/20 2017 10/12/20 0032 10/12/20 0732  GLUCAP 332* 232* 182*   Lipid Profile: No results for input(s): CHOL, HDL, LDLCALC, TRIG, CHOLHDL, LDLDIRECT in the last 72 hours. Thyroid Function Tests: No results for input(s): TSH,  T4TOTAL, FREET4, T3FREE, THYROIDAB in the last 72 hours. Anemia Panel: No results for input(s): VITAMINB12, FOLATE, FERRITIN, TIBC, IRON, RETICCTPCT in the last 72 hours. Urine analysis:    Component Value Date/Time   COLORURINE YELLOW 10/11/2020 2016   APPEARANCEUR CLOUDY (A) 10/11/2020 2016   LABSPEC 1.011 10/11/2020 2016   PHURINE 5.0 10/11/2020 2016   GLUCOSEU >=500 (A) 10/11/2020 2016   HGBUR LARGE (A) 10/11/2020 2016   BILIRUBINUR NEGATIVE 10/11/2020 2016   KETONESUR NEGATIVE 10/11/2020 2016   PROTEINUR 100 (A) 10/11/2020 2016   UROBILINOGEN 0.2 12/04/2013 0210   NITRITE NEGATIVE 10/11/2020 2016   LEUKOCYTESUR LARGE (A) 10/11/2020 2016   Sepsis Labs: @LABRCNTIP (procalcitonin:4,lacticacidven:4)  ) Recent Results (from the past 240 hour(s))  Resp Panel by RT-PCR (Flu A&B, Covid) Nasopharyngeal Swab     Status: None   Collection Time: 10/12/20  1:55 AM   Specimen: Nasopharyngeal Swab; Nasopharyngeal(NP) swabs in vial transport medium  Result Value Ref Range Status   SARS Coronavirus 2 by RT PCR NEGATIVE NEGATIVE Final    Comment: (NOTE) SARS-CoV-2 target nucleic acids are NOT DETECTED.  The SARS-CoV-2 RNA is generally detectable in upper respiratory specimens during the acute phase of infection. The lowest concentration of SARS-CoV-2 viral copies this assay can detect is 138 copies/mL. A negative result does not preclude SARS-Cov-2 infection and should not be used as the sole basis for treatment or other patient management decisions. A negative result may occur with  improper specimen collection/handling, submission of specimen other than nasopharyngeal swab, presence of viral mutation(s) within the areas targeted by this assay, and inadequate number of viral copies(<138 copies/mL). A negative result must be combined with clinical observations, patient history, and epidemiological information. The expected result is Negative.  Fact Sheet for Patients:   EntrepreneurPulse.com.au  Fact Sheet for Healthcare Providers:  IncredibleEmployment.be  This test is no t yet approved or cleared by the [place] FDA and  has been authorized for detection and/or diagnosis of SARS-CoV-2 by FDA under an Emergency Use Authorization (EUA). This EUA will remain  in effect (meaning this test can be used) for the duration of the COVID-19 declaration under Section 564(b)(1) of the Act, 21 U.S.C.section 360bbb-3(b)(1), unless the authorization is terminated  or revoked sooner.       Influenza A by PCR NEGATIVE NEGATIVE Final   Influenza B by PCR NEGATIVE NEGATIVE Final    Comment: (NOTE) The Xpert Xpress SARS-CoV-2/FLU/RSV plus assay is intended as an aid in the diagnosis of influenza from Nasopharyngeal swab specimens and should not be used as a sole basis for treatment. Nasal washings and aspirates are unacceptable for Xpert Xpress SARS-CoV-2/FLU/RSV testing.  Fact Sheet for Patients: EntrepreneurPulse.com.au  Fact Sheet for  Healthcare Providers: IncredibleEmployment.be  This test is not yet approved or cleared by the [place] and has been authorized for detection and/or diagnosis of SARS-CoV-2 by FDA under an Emergency Use Authorization (EUA). This EUA will remain in effect (meaning this test can be used) for the duration of the COVID-19 declaration under Section 564(b)(1) of the Act, 21 U.S.C. section 360bbb-3(b)(1), unless the authorization is terminated or revoked.  Performed at [facility], [place] [address], [place], [place] [address]          Radiology Studies: DG Thoracic Spine 2 View  Result Date: 10/11/2020 CLINICAL DATA:  Back pain, fall EXAM: THORACIC SPINE 2 VIEWS COMPARISON:  None. FINDINGS: Thoracic alignment within normal limits. Vertebral body heights are maintained. Mild degenerative osteophytes. IMPRESSION: No acute osseous  abnormality Electronically Signed   By: Donavan Foil M.D.   On: 10/11/2020 21:51   DG Lumbar Spine Complete  Result Date: 10/11/2020 CLINICAL DATA:  Back pain, fall EXAM: LUMBAR SPINE - COMPLETE 4+ VIEW COMPARISON:  10/06/2020 FINDINGS: Stable lumbar alignment. Trace anterolisthesis L4 on L5. Vertebral body heights are maintained. Moderate disc space narrowing and degenerative change at L5-S1. Aortic atherosclerosis. Oval calcification is seen in left upper quadrant on one view only IMPRESSION: No acute osseous abnormality Electronically Signed   By: Donavan Foil M.D.   On: 10/11/2020 21:50   DG Knee 2 Views Left  Result Date: 10/11/2020 CLINICAL DATA:  Fall, bilateral knee pain. EXAM: LEFT KNEE - 1-2 VIEW COMPARISON:  None. FINDINGS: No evidence of fracture, dislocation, or joint effusion. Normal joint spaces. Advanced vascular calcifications. Soft tissues are unremarkable. IMPRESSION: 1. No fracture or subluxation of the left knee. 2. Advanced vascular calcifications. Electronically Signed   By: Keith Rake M.D.   On: 10/11/2020 20:44   DG Knee 2 Views Right  Result Date: 10/11/2020 CLINICAL DATA:  Post fall with bilateral knee pain. EXAM: RIGHT KNEE - 1-2 VIEW COMPARISON:  None. FINDINGS: No evidence of fracture, dislocation, or joint effusion. Normal joint spaces. Prominent vascular calcifications. Soft tissues are unremarkable. IMPRESSION: 1. No fracture or subluxation of the right knee. 2. Prominent vascular calcifications. Electronically Signed   By: Keith Rake M.D.   On: 10/11/2020 20:43   CT Head Wo Contrast  Result Date: 10/11/2020 CLINICAL DATA:  Fall EXAM: CT HEAD WITHOUT CONTRAST CT CERVICAL SPINE WITHOUT CONTRAST TECHNIQUE: Multidetector CT imaging of the head and cervical spine was performed following the standard protocol without intravenous contrast. Multiplanar CT image reconstructions of the cervical spine were also generated. COMPARISON:  10/06/2020 head CT and cervical  spine CT FINDINGS: CT HEAD FINDINGS Brain: There is no mass, hemorrhage or extra-axial collection. There is generalized atrophy without lobar predilection. There is hypoattenuation of the periventricular white matter, most commonly indicating chronic ischemic microangiopathy. Vascular: Atherosclerotic calcification of the internal carotid arteries at the skull base. No abnormal hyperdensity of the major intracranial arteries or dural venous sinuses. Skull: The visualized skull base, calvarium and extracranial soft tissues are normal. Sinuses/Orbits: No fluid levels or advanced mucosal thickening of the visualized paranasal sinuses. No mastoid or middle ear effusion. The orbits are normal. CT CERVICAL SPINE FINDINGS Cervical spine imaging is severely degraded by motion. Within that limitation, there is no acute fracture. Alignment is grossly normal. IMPRESSION: 1. Chronic ischemic microangiopathy and generalized atrophy without acute intracranial abnormality. 2. Cervical spine imaging is severely degraded by motion. Within that limitation, there is no acute fracture or static subluxation of the  cervical spine. Electronically Signed   By: Ulyses Jarred M.D.   On: 10/11/2020 21:10   CT Cervical Spine Wo Contrast  Result Date: 10/11/2020 CLINICAL DATA:  Fall EXAM: CT HEAD WITHOUT CONTRAST CT CERVICAL SPINE WITHOUT CONTRAST TECHNIQUE: Multidetector CT imaging of the head and cervical spine was performed following the standard protocol without intravenous contrast. Multiplanar CT image reconstructions of the cervical spine were also generated. COMPARISON:  10/06/2020 head CT and cervical spine CT FINDINGS: CT HEAD FINDINGS Brain: There is no mass, hemorrhage or extra-axial collection. There is generalized atrophy without lobar predilection. There is hypoattenuation of the periventricular white matter, most commonly indicating chronic ischemic microangiopathy. Vascular: Atherosclerotic calcification of the internal  carotid arteries at the skull base. No abnormal hyperdensity of the major intracranial arteries or dural venous sinuses. Skull: The visualized skull base, calvarium and extracranial soft tissues are normal. Sinuses/Orbits: No fluid levels or advanced mucosal thickening of the visualized paranasal sinuses. No mastoid or middle ear effusion. The orbits are normal. CT CERVICAL SPINE FINDINGS Cervical spine imaging is severely degraded by motion. Within that limitation, there is no acute fracture. Alignment is grossly normal. IMPRESSION: 1. Chronic ischemic microangiopathy and generalized atrophy without acute intracranial abnormality. 2. Cervical spine imaging is severely degraded by motion. Within that limitation, there is no acute fracture or static subluxation of the cervical spine. Electronically Signed   By: Ulyses Jarred M.D.   On: 10/11/2020 21:10   DG Chest Portable 1 View  Result Date: 10/11/2020 CLINICAL DATA:  Fall EXAM: PORTABLE CHEST 1 VIEW COMPARISON:  09/16/2020, CT 12/21/2019, radiograph 05/02/2016 FINDINGS: Diminished lung volumes with elevation of right diaphragm. Streaky atelectasis at the right base. No consolidation, pleural effusion or pneumothorax. Stable cardiomediastinal silhouette. IMPRESSION: Low lung volumes with elevated right diaphragm and subsegmental atelectasis at the right base. Electronically Signed   By: Donavan Foil M.D.   On: 10/11/2020 21:26   CT Renal Stone Study  Result Date: 10/12/2020 CLINICAL DATA:  Hematuria EXAM: CT ABDOMEN AND PELVIS WITHOUT CONTRAST TECHNIQUE: Multidetector CT imaging of the abdomen and pelvis was performed following the standard protocol without IV contrast. COMPARISON:  09/14/2020 FINDINGS: Motion degraded images. Lower chest: Mild subpleural reticulation in the lungs bilaterally, likely post infectious/inflammatory. Hepatobiliary: Unenhanced liver is unremarkable. Gallbladder is unremarkable. No intrahepatic or extrahepatic ductal dilatation.  Pancreas: Within normal limits. Spleen: Within normal limits. Adrenals/Urinary Tract: Adrenal glands are within normal limits. Two nonobstructing right upper pole renal calculi measuring up to 2 mm (series 3/image 43), unchanged. Additional bilateral renal cysts, including a 16 mm hyperdense probable cyst in the lateral interpolar left kidney (series 3/image 52), poorly evaluated on unenhanced CT. No hydronephrosis. Bladder is decompressed by an indwelling Foley catheter. Stomach/Bowel: Stomach is within normal limits. No evidence of bowel obstruction. Normal appendix (series 3/image 70). Mild sigmoid diverticulosis, without evidence of diverticulitis. Vascular/Lymphatic: No evidence of abdominal aortic aneurysm. Atherosclerotic calcifications of the abdominal aorta and branch vessels. No suspicious abdominopelvic lymphadenopathy. Reproductive: Prostate is unremarkable. Other: No abdominopelvic ascites. Musculoskeletal: Degenerative changes of the visualized thoracolumbar spine. IMPRESSION: Motion degraded images. Two nonobstructing right upper pole renal calculi measuring up to 2 mm, unchanged. No hydronephrosis. Bilateral renal cysts, poorly evaluated, including a 16 mm hyperdense probable cyst in the lateral interpolar left kidney. This previously measured 10 mm in 2015, supporting a benign etiology. Bladder decompressed by indwelling Foley catheter. Electronically Signed   By: Julian Hy M.D.   On: 10/12/2020 01:56   DG HIP UNILAT  WITH PELVIS 2-3 VIEWS LEFT  Result Date: 10/11/2020 CLINICAL DATA:  Fall on blood thinners. EXAM: DG HIP (WITH OR WITHOUT PELVIS) 2-3V LEFT COMPARISON:  Abdomen 01/11/2015 FINDINGS: Catheter projected over the midline likely representing a Foley catheter. Pelvis and left hip appear intact. No acute displaced fractures identified. Vascular calcifications. SI joints and symphysis pubis are not displaced. IMPRESSION: No acute displaced fractures identified. Electronically Signed    By: Lucienne Capers M.D.   On: 10/11/2020 20:48   DG HIP UNILAT WITH PELVIS 2-3 VIEWS RIGHT  Result Date: 10/11/2020 CLINICAL DATA:  [facility] on blood thinners. EXAM: DG HIP (WITH OR WITHOUT PELVIS) 2-3V RIGHT COMPARISON:  10/06/2020 FINDINGS: Foley catheter. Right hip appears intact. No evidence of acute fracture or dislocation. No focal bone lesion or bone destruction. Mild degenerative changes in the hips. Vascular calcifications. IMPRESSION: No acute displaced fractures identified. Electronically Signed   By: Lucienne Capers M.D.   On: 10/11/2020 20:49        Scheduled Meds:  amiodarone  200 mg Oral Daily   apixaban  5 mg Oral BID   DULoxetine  30 mg Oral Daily   ferrous sulfate  325 mg Oral Daily   gabapentin  300 mg Oral QHS   insulin aspart  0-5 Units Subcutaneous QHS   insulin aspart  0-9 Units Subcutaneous TID WC   metoprolol tartrate  12.5 mg Oral BID   predniSONE  40 mg Oral QAC breakfast   Rotigotine  1 patch Transdermal QPM   thiamine  100 mg Oral Daily   Continuous Infusions:  cefTRIAXone (ROCEPHIN)  IV     lactated ringers 50 mL/hr (10/12/20 0051)     LOS: 1 day    Time spent: 30 minutes    Edwin Dada, MD [facility] 10/12/2020, 8:49 AM     Please page though [place] or Epic secure chat:  For password, contact charge nurse

## 2020-10-12 NOTE — ED Notes (Signed)
Pt not able to take PO medication at this time, pt very confuse and medication given to help him relax, pt too sedated at this time to follow instructions.

## 2020-10-12 NOTE — ED Notes (Signed)
Report received from [place]

## 2020-10-12 NOTE — Progress Notes (Signed)
Inpatient Diabetes Program Recommendations  AACE/ADA: New Consensus Statement on Inpatient Glycemic Control (2015)  Target Ranges:  Prepandial:   less than 140 mg/dL      Peak postprandial:   less than 180 mg/dL (1-2 hours)      Critically ill patients:  140 - 180 mg/dL   Results for Jason Hartman, Jason Hartman (MRN [id number]) as of 10/12/2020 10:14  Ref. Range 10/11/2020 20:17 10/12/2020 00:32 10/12/2020 07:32  Glucose-Capillary Latest Ref Range: 70 - 99 mg/dL 332 (H) 232 (H)  2 units NOVOLOG  182 (H)  2 units NOVOLOG    Results for Jason Hartman, Jason Hartman (MRN [id number]) as of 10/12/2020 10:14  Ref. Range 10/12/2020 03:32  Hemoglobin A1C Latest Ref Range: 4.8 - 5.6 % 7.1 (H)    Admit from SNF with AMS/ UTI/ Fall  History: DM, Parkinson's, Indwelling Foley Cath  SNF DM Meds: Lantus 8 units BID      Metformin 500 mg Daily      Ozempic 0.5 mg Qweek  Current Orders: Novolog Sensitive Correction Scale/ SSI (0-9 units) TID AC + HS     Prednisone 40 mg Daily    MD- Please consider ordering Lantus 4 units BID (50% home dose to start)     --Will follow patient during hospitalization--  Wyn Quaker RN, MSN, CDE Diabetes Coordinator Inpatient Glycemic Control Team Team Pager: [phone] (8a-5p)

## 2020-10-12 NOTE — ED Notes (Signed)
Pt climbing out of bed and pulling at IVs and cords. Pt found with legs over rails and screaming at staff.

## 2020-10-12 NOTE — ED Notes (Signed)
Report given to [facility]

## 2020-10-12 NOTE — ED Notes (Signed)
Pt bed linen changed

## 2020-10-12 NOTE — H&P (Addendum)
History and Physical  YURIY CUI NIO:[id number] DOB: Aug 23, 1935 DOA: 10/11/2020  Referring physician: Dr. Tomi Bamberger, [place]  PCP: [name], MD  Outpatient Specialists: Urology, podiatry, neurology Patient coming from: SNF.  Chief Complaint: Confusion  HPI: FROILAN MCLEAN is a 85 y.o. male with medical history significant for paroxysmal A. fib on Eliquis, OSA, type 2 diabetes, chronic anxiety/depression, coronary artery disease, hypertension, hyperlipidemia, bladder cancer, BPH, chronic indwelling Foley catheter, hypogonadism, Parkinson disease, restless leg syndrome, chronic low back pain without sciatica, diabetic polyneuropathy, insomnia, who presented to [facility] ED from SNF due to confusion and possible unwitnessed fall.  Patient was found on the ground by staff.  Unable to obtain a history from the patient due to confusion.  History is mainly obtained from EDP and from review of medical records.  Patient was attempting to get out of bed on his own and crawl out of the facility, stating he dislikes living at the nursing facility where he resides.  He was brought in via EMS for further evaluation.  In the ED work-up revealed complicated UTI with indwelling Foley catheter for which he was started on Rocephin.  TR[facility], hospitalist team, was asked to admit.  ED Course:  Temperature 97.7.  BP 149/96, pulse 105, respiration rate 17, O2 saturation 96% on room air.  Lab studies remarkable for WBC 11.3, hemoglobin 17.4, hematocrit 53, MCV 86, platelet 251.  Serum sodium 135, potassium 4.7, serum bicarb 20, glucose 304, BUN 22, creatinine 1.47, anion gap 16, GFR 47.  Review of Systems: Review of systems as noted in the HPI. All other systems reviewed and are negative.   Past Medical History:  Diagnosis Date   Cellulitis and abscess of leg, except foot    Hypertension    Obstructive sleep apnea (adult) (pediatric)    Pain in joint, pelvic region and thigh    Pneumonia, organism unspecified(486)     Restless legs syndrome (RLS)    RLS (restless legs syndrome) 09/01/2012   Thoracic or lumbosacral neuritis or radiculitis, unspecified    Type II or unspecified type diabetes mellitus without mention of complication, not stated as uncontrolled    Unspecified disease of pericardium    Unspecified hereditary and idiopathic peripheral neuropathy    Past Surgical History:  Procedure Laterality Date   ANAL FISSURE REPAIR     pericarditis  2009    Social History:  reports that he quit smoking about 40 years ago. He has never used smokeless tobacco. He reports that he does not drink alcohol and does not use drugs.   Allergies  Allergen Reactions   Oxycodone Anaphylaxis    Other reaction(s): Other (See Comments) Cannot recall specifics   Iodinated Diagnostic Agents Hives    alleric to renografin,isovue & omnipaque, hives, requires 13 hr prep//a.calhoun, Onset Date: [id number]      Iodine Hives and Rash    alleric to renografin,isovue & omnipaque, hives, requires 13 hr prep//a.calhoun, Onset Date: [id number] allergic to renografin,isovue & omnipaque, hives, requires 13 hr prep//a.calhoun, Onset Date: [id number]   Linezolid Hives and Rash        Methadone Hcl Other (See Comments)    HALLUCINATIONS    Promethazine Other (See Comments)    Mental status change, DELIRIUM   Other reaction(s): erratic behavior Other reaction(s): Other (See Comments) Mental status change DELIRIUM (pulled out IV)   Morphine Sulfate Other (See Comments)    Pt not sure about allergy    Other Hives    Other reaction(s): erratic behanvior  Other reaction(s): erratic behavior   Oxycodone Hcl Other (See Comments)    Pt not sure about allergy    Quetiapine Other (See Comments)    Pt not sure about allergy  Other reaction(s): Other (See Comments) Pt not sure about allergy  Pt not sure about allergy    Statins     Other reaction(s): muscle weakness Other reaction(s): muscle weakness   Zolpidem Other (See  Comments)    Other reaction(s): erratic behanvior Other reaction(s): Delusions (intolerance) Altered mental status   Atorvastatin Itching and Other (See Comments)    Tired, weakness    Carbidopa-Levodopa Anxiety   Chlorhexidine Gluconate [Chlorhexidine] Hives and Rash   Clindamycin Rash   Colesevelam Other (See Comments)    tired    Doxazosin Rash   Lovastatin Other (See Comments)    Tired, nervousness    Rosuvastatin Other (See Comments)    Tired, weakness     Family History  Problem Relation Age of Onset   Diabetes Father       Prior to Admission medications   Medication Sig Start Date End Date Taking? Authorizing Provider  acetaminophen (TYLENOL) 500 MG tablet Take 500 mg by mouth every 6 (six) hours as needed for mild pain.    [provider]  allopurinol (ZYLOPRIM) 100 MG tablet Take 2 tablets daily. Patient not taking: No sig reported 02/03/17   Trula Slade, DPM  amiodarone (PACERONE) 200 MG tablet Take 1 tablet (200 mg total) by mouth daily. 09/26/20   Mercy Riding, MD  apixaban (ELIQUIS) 5 MG TABS tablet Take 1 tablet (5 mg total) by mouth 2 (two) times daily. 09/29/20 10/29/20  Mercy Riding, MD  BD PEN NEEDLE NANO 2ND GEN 32G X 4 MM MISC FOR LANTUS AND VICTOZA PENS 11/30/19   [provider]  ferrous sulfate 325 (65 FE) MG EC tablet Take 325 mg by mouth daily.  05/28/18   [provider]  fluticasone (FLONASE) 50 MCG/ACT nasal spray Place 2 sprays into both nostrils daily. 06/21/20   [provider]  gabapentin (NEURONTIN) 300 MG capsule Take 1 capsule (300 mg total) by mouth at bedtime. 09/25/20   Mercy Riding, MD  LANTUS SOLOSTAR 100 UNIT/ML Solostar Pen Inject 8 Units into the skin 2 (two) times daily. 09/25/20   Mercy Riding, MD  metFORMIN (GLUCOPHAGE-XR) 500 MG 24 hr tablet Take 500 mg by mouth daily with breakfast. 01/17/20   [provider]  metoprolol tartrate (LOPRESSOR) 25 MG tablet Take 0.5 tablets (12.5 mg  total) by mouth 2 (two) times daily. 09/25/20   Mercy Riding, MD  ofloxacin (OCUFLOX) 0.3 % ophthalmic solution Place 1 drop into both eyes 4 (four) times daily as needed (eye irritation). 03/07/19   [provider]  ondansetron (ZOFRAN-ODT) 4 MG disintegrating tablet Take 4 mg by mouth every 8 (eight) hours as needed for nausea or vomiting. 02/07/20   [provider]  polyethylene glycol powder (GLYCOLAX/MIRALAX) 17 GM/SCOOP powder Take 17 g by mouth daily.    [provider]  Rotigotine (NEUPRO) 8 MG/24HR PT24 Place 1 patch onto the skin every evening.    [provider]  Semaglutide,0.25 or 0.5MG /DOS, (OZEMPIC, 0.25 OR 0.5 MG/DOSE,) 2 MG/1.5ML SOPN Inject 0.5 mg into the skin every Tuesday.    [provider]  senna-docusate (SENOKOT-S) 8.6-50 MG tablet Take 1 tablet by mouth 2 (two) times daily as needed for moderate constipation. 09/25/20   Mercy Riding, MD  tadalafil (CIALIS) 5 MG tablet Take 5 mg by mouth daily. 04/13/20   [provider]  thiamine 100 MG tablet Take 1 tablet (100 mg total) by mouth daily. 09/26/20   Mercy Riding, MD    Physical Exam: BP (!) 149/94   Pulse (!) 108   Temp 97.7 F (36.5 C) (Oral)   Resp (!) 26   Ht 5\' 4"  (1.626 m)   Wt 79.8 kg   SpO2 97%   BMI 30.20 kg/m   General: 85 y.o. year-old male well developed well nourished in no acute distress.  Alert and confused. Cardiovascular: Tachycardic with no rubs or gallops.  No thyromegaly or JVD noted.  No lower extremity edema. 2/4 pulses in all 4 extremities. Respiratory: Clear to auscultation with no wheezes or rales. Good inspiratory effort. Abdomen: Soft nontender nondistended with normal bowel sounds x4 quadrants. Muskuloskeletal: No cyanosis, clubbing or edema noted bilaterally Neuro: CN II-XII intact, strength, sensation, reflexes Skin: No ulcerative lesions noted or rashes Psychiatry: Judgement and insight appear altered. Mood is appropriate for  condition and setting          Labs on Admission:  Basic Metabolic Panel: Recent Labs  Lab 10/11/20 2006  NA 135  K 4.7  CL 99  CO2 20*  GLUCOSE 304*  BUN 22  CREATININE 1.47*  CALCIUM 9.8   Liver Function Tests: No results for input(s): AST, ALT, ALKPHOS, BILITOT, PROT, ALBUMIN in the last 168 hours. No results for input(s): LIPASE, AMYLASE in the last 168 hours. No results for input(s): AMMONIA in the last 168 hours. CBC: Recent Labs  Lab 10/11/20 2006  WBC 11.3*  HGB 17.4*  HCT 53.0*  MCV 86.6  PLT 251   Cardiac Enzymes: No results for input(s): CKTOTAL, CKMB, CKMBINDEX, TROPONINI in the last 168 hours.  BNP (last 3 results) Recent Labs    09/10/20 1108  BNP 95.6    ProBNP (last 3 results) No results for input(s): PROBNP in the last 8760 hours.  CBG: Recent Labs  Lab 10/11/20 2017  GLUCAP 332*    Radiological Exams on Admission: DG Thoracic Spine 2 View  Result Date: 10/11/2020 CLINICAL DATA:  Back pain, fall EXAM: THORACIC SPINE 2 VIEWS COMPARISON:  None. FINDINGS: Thoracic alignment within normal limits. Vertebral body heights are maintained. Mild degenerative osteophytes. IMPRESSION: No acute osseous abnormality Electronically Signed   By: Donavan Foil M.D.   On: 10/11/2020 21:51   DG Lumbar Spine Complete  Result Date: 10/11/2020 CLINICAL DATA:  Back pain, fall EXAM: LUMBAR SPINE - COMPLETE 4+ VIEW COMPARISON:  10/06/2020 FINDINGS: Stable lumbar alignment. Trace anterolisthesis L4 on L5. Vertebral body heights are maintained. Moderate disc space narrowing and degenerative change at L5-S1. Aortic atherosclerosis. Oval calcification is seen in left upper quadrant on one view only IMPRESSION: No acute osseous abnormality Electronically Signed   By: Donavan Foil M.D.   On: 10/11/2020 21:50   DG Knee 2 Views Left  Result Date: 10/11/2020 CLINICAL DATA:  Fall, bilateral knee pain. EXAM: LEFT KNEE - 1-2 VIEW COMPARISON:  None. FINDINGS: No evidence of  fracture, dislocation, or joint effusion. Normal joint spaces. Advanced vascular calcifications. Soft tissues are unremarkable. IMPRESSION: 1. No fracture or subluxation of the left knee. 2. Advanced vascular calcifications. Electronically Signed   By: Keith Rake M.D.   On: 10/11/2020 20:44   DG Knee 2 Views Right  Result Date: 10/11/2020 CLINICAL DATA:  Post fall with bilateral knee pain. EXAM: RIGHT KNEE - 1-2  VIEW COMPARISON:  None. FINDINGS: No evidence of fracture, dislocation, or joint effusion. Normal joint spaces. Prominent vascular calcifications. Soft tissues are unremarkable. IMPRESSION: 1. No fracture or subluxation of the right knee. 2. Prominent vascular calcifications. Electronically Signed   By: Keith Rake M.D.   On: 10/11/2020 20:43   CT Head Wo Contrast  Result Date: 10/11/2020 CLINICAL DATA:  Fall EXAM: CT HEAD WITHOUT CONTRAST CT CERVICAL SPINE WITHOUT CONTRAST TECHNIQUE: Multidetector CT imaging of the head and cervical spine was performed following the standard protocol without intravenous contrast. Multiplanar CT image reconstructions of the cervical spine were also generated. COMPARISON:  10/06/2020 head CT and cervical spine CT FINDINGS: CT HEAD FINDINGS Brain: There is no mass, hemorrhage or extra-axial collection. There is generalized atrophy without lobar predilection. There is hypoattenuation of the periventricular white matter, most commonly indicating chronic ischemic microangiopathy. Vascular: Atherosclerotic calcification of the internal carotid arteries at the skull base. No abnormal hyperdensity of the major intracranial arteries or dural venous sinuses. Skull: The visualized skull base, calvarium and extracranial soft tissues are normal. Sinuses/Orbits: No fluid levels or advanced mucosal thickening of the visualized paranasal sinuses. No mastoid or middle ear effusion. The orbits are normal. CT CERVICAL SPINE FINDINGS Cervical spine imaging is severely degraded  by motion. Within that limitation, there is no acute fracture. Alignment is grossly normal. IMPRESSION: 1. Chronic ischemic microangiopathy and generalized atrophy without acute intracranial abnormality. 2. Cervical spine imaging is severely degraded by motion. Within that limitation, there is no acute fracture or static subluxation of the cervical spine. Electronically Signed   By: Ulyses Jarred M.D.   On: 10/11/2020 21:10   CT Cervical Spine Wo Contrast  Result Date: 10/11/2020 CLINICAL DATA:  Fall EXAM: CT HEAD WITHOUT CONTRAST CT CERVICAL SPINE WITHOUT CONTRAST TECHNIQUE: Multidetector CT imaging of the head and cervical spine was performed following the standard protocol without intravenous contrast. Multiplanar CT image reconstructions of the cervical spine were also generated. COMPARISON:  10/06/2020 head CT and cervical spine CT FINDINGS: CT HEAD FINDINGS Brain: There is no mass, hemorrhage or extra-axial collection. There is generalized atrophy without lobar predilection. There is hypoattenuation of the periventricular white matter, most commonly indicating chronic ischemic microangiopathy. Vascular: Atherosclerotic calcification of the internal carotid arteries at the skull base. No abnormal hyperdensity of the major intracranial arteries or dural venous sinuses. Skull: The visualized skull base, calvarium and extracranial soft tissues are normal. Sinuses/Orbits: No fluid levels or advanced mucosal thickening of the visualized paranasal sinuses. No mastoid or middle ear effusion. The orbits are normal. CT CERVICAL SPINE FINDINGS Cervical spine imaging is severely degraded by motion. Within that limitation, there is no acute fracture. Alignment is grossly normal. IMPRESSION: 1. Chronic ischemic microangiopathy and generalized atrophy without acute intracranial abnormality. 2. Cervical spine imaging is severely degraded by motion. Within that limitation, there is no acute fracture or static subluxation of  the cervical spine. Electronically Signed   By: Ulyses Jarred M.D.   On: 10/11/2020 21:10   DG Chest Portable 1 View  Result Date: 10/11/2020 CLINICAL DATA:  Fall EXAM: PORTABLE CHEST 1 VIEW COMPARISON:  09/16/2020, CT 12/21/2019, radiograph 05/02/2016 FINDINGS: Diminished lung volumes with elevation of right diaphragm. Streaky atelectasis at the right base. No consolidation, pleural effusion or pneumothorax. Stable cardiomediastinal silhouette. IMPRESSION: Low lung volumes with elevated right diaphragm and subsegmental atelectasis at the right base. Electronically Signed   By: Donavan Foil M.D.   On: 10/11/2020 21:26   DG HIP UNILAT WITH  PELVIS 2-3 VIEWS LEFT  Result Date: 10/11/2020 CLINICAL DATA:  Fall on blood thinners. EXAM: DG HIP (WITH OR WITHOUT PELVIS) 2-3V LEFT COMPARISON:  Abdomen 01/11/2015 FINDINGS: Catheter projected over the midline likely representing a Foley catheter. Pelvis and left hip appear intact. No acute displaced fractures identified. Vascular calcifications. SI joints and symphysis pubis are not displaced. IMPRESSION: No acute displaced fractures identified. Electronically Signed   By: Lucienne Capers M.D.   On: 10/11/2020 20:48   DG HIP UNILAT WITH PELVIS 2-3 VIEWS RIGHT  Result Date: 10/11/2020 CLINICAL DATA:  [place] on blood thinners. EXAM: DG HIP (WITH OR WITHOUT PELVIS) 2-3V RIGHT COMPARISON:  10/06/2020 FINDINGS: Foley catheter. Right hip appears intact. No evidence of acute fracture or dislocation. No focal bone lesion or bone destruction. Mild degenerative changes in the hips. Vascular calcifications. IMPRESSION: No acute displaced fractures identified. Electronically Signed   By: Lucienne Capers M.D.   On: 10/11/2020 20:49    EKG: I independently viewed the EKG done and my findings are as followed: ST, non specific ST T changes.  QTC 506.   Assessment/Plan Present on Admission:  AMS (altered mental status)  Active Problems:   AMS (altered mental status)  Acute  metabolic encephalopathy suspect contributed by UTI and pain Presented with confusion, UA + pyuria, fall Received one dose of Rocephin in the ED, continue Follow urine CX for ID and sensitivities Reorient as needed Pain control Fall/aspiration/delirium precautions  Complicated UTI with indwelling Foley catheter, POA UA positive for pyuria Follow-up urine culture for ID and sensitivities  AKI, suspect prerenal in the setting of dehydration and poor oral intake. Presented with creatinine of 1.47 with GFR 47 Baseline creatinine 1.07 with GFR of 60. Avoid nephrotoxic agents, dehydration and hypotension  Anion gap metabolic acidosis likely secondary to renal insufficiency Presented with serum bicarb of 20 and anion gap of 16 IV fluid hydration BMP in the morning  Back pain, rule out kidney stone Follow CT renal IV fluid hydration Pain control  Prolonged QTC QTC 506 Avoid QTC prolonging agents Optimize magnesium and potassium level  Type 2 diabetes with hyperglycemia Presented with serum glucose of 304 Obtain hemoglobin A1c Start insulin sliding scale  Diabetic polyneuropathy Resume home regimen. Gabapentin  Chronic anxiety/depression Resume home regimen  Chronic diastolic CHF Last 2D echo done on 09/09/2020 revealed LVEF 55 to 60% with grade 1 diastolic dysfunction Strict I's and O's and daily weight  Restless leg syndrome Resume home regimen  Lumbago Follows with neurology History of spondylolisthesis Resume home regimen.  Parkinsonism/ambulatory dysfunction Follows with neurology outpatient. PT OT to assess Fall precaution  Restless leg syndrome He is on combination of Neupro patch, gabapentin and tramadol Fall precaution  Recent positive COVID-19 screening test Per EDP, Dr. Tomi Bamberger, SNF called reporting that patient tested positive for COVID. COVID-19 screen test pending.  Paroxysmal A. fib on Eliquis Resume home amiodarone and Eliquis Monitor on  telemetry  OSA CPAP nightly   DVT prophylaxis: Eliquis  Code Status: Full code  Family Communication: None at bedside  Disposition Plan: Admit to telemetry medical.  Consults called: None.  Admission status: Inpatient status.  Patient will require at least 2 midnights for further evaluation and treatment of present condition.   Status is: Inpatient    Dispo:  Patient From: [place]  Planned Disposition: [place]  Medically stable for discharge: No         Kayleen Memos MD [facility] Pager [phone]  If 7PM-7AM,  please contact night-coverage [email or web address] Password [email or web address]  10/12/2020, 12:09 AM

## 2020-10-12 NOTE — ED Notes (Signed)
Full linen change done. Pt resting with eye closed at this time

## 2020-10-12 NOTE — Progress Notes (Signed)
Jason Hartman is a 85 y.o. male patient admitted. Awake, alert - oriented  X 4 - no acute distress noted.  VSS - Blood pressure 120/64, pulse 77, temperature 97.8 F (36.6 C), temperature source Oral, resp. rate 17, height 5\' 4"  (1.626 m), weight 79.8 kg, SpO2 97 %.    IV in place, occlusive dsg intact without redness. Dual skin assessment done with Reginold Agent, RN. Abrasions to bilateral upper extremities, foam dressings applied where needed. Stage 2 pressure injury noted on right side intergluteal cleft area, foam dressing and barrier cream applied. Wound consult ordered. Redness to posterior and anterior of chest and back area. MASD to genital area. Green discharge near foley catheter. PT c/o pain when cleaning. Foley unclamped, dark brown urine in drainage bag.   Orientation to room. SR up x 2, fall assessment complete qith floor mats down. Call light within reach, patient able to voice, and demonstrate understanding.  Home medications sent to pharmacy to be dispensed at scheduled times.   Will cont to eval and treat per MD orders.

## 2020-10-12 NOTE — ED Notes (Signed)
Attempted to call report x 1  

## 2020-10-12 NOTE — ED Notes (Signed)
Pt remove his IV, multiple attempts made to get another IV with no success. IV team consulted.

## 2020-10-13 DIAGNOSIS — A419 Sepsis, unspecified organism: Secondary | ICD-10-CM

## 2020-10-13 LAB — GLUCOSE, CAPILLARY
Glucose-Capillary: 308 mg/dL — ABNORMAL HIGH (ref 70–99)
Glucose-Capillary: 117 mg/dL — ABNORMAL HIGH (ref 70–99)
Glucose-Capillary: 176 mg/dL — ABNORMAL HIGH (ref 70–99)
Glucose-Capillary: 302 mg/dL — ABNORMAL HIGH (ref 70–99)
Glucose-Capillary: 110 mg/dL — ABNORMAL HIGH (ref 70–99)

## 2020-10-13 LAB — BASIC METABOLIC PANEL
Anion gap: 10 (ref 5–15)
BUN: 23 mg/dL (ref 8–23)
CO2: 24 mmol/L (ref 22–32)
Calcium: 8.6 mg/dL — ABNORMAL LOW (ref 8.9–10.3)
Chloride: 99 mmol/L (ref 98–111)
Creatinine, Ser: 1.02 mg/dL (ref 0.61–1.24)
GFR, Estimated: >60 mL/min (ref >60)
Glucose, Bld: 154 mg/dL — ABNORMAL HIGH (ref 70–99)
Potassium: 3.8 mmol/L (ref 3.5–5.1)
Sodium: 133 mmol/L — ABNORMAL LOW (ref 135–145)

## 2020-10-13 LAB — CBC
HCT: 43.2 % (ref 39.0–52.0)
Hemoglobin: 14.2 g/dL (ref 13.0–17.0)
MCH: 28.6 pg (ref 26.0–34.0)
MCHC: 32.9 g/dL (ref 30.0–36.0)
MCV: 86.9 fL (ref 80.0–100.0)
Platelets: 243 10*3/uL (ref 150–400)
RBC: 4.97 MIL/uL (ref 4.22–5.81)
RDW: 14.4 % (ref 11.5–15.5)
WBC: 12.2 10*3/uL — ABNORMAL HIGH (ref 4.0–10.5)
nRBC: 0 % (ref 0.0–0.2)

## 2020-10-13 MED ORDER — LACTATED RINGERS IV SOLN: INTRAVENOUS | Status: DC

## 2020-10-13 MED ORDER — INSULIN GLARGINE 100 UNIT/ML ~~LOC~~ SOLN
8.0000 [IU] | Freq: Every day | SUBCUTANEOUS | Status: DC
  Administered 2020-10-13 – 2020-10-16 (×4): 8 [IU] via SUBCUTANEOUS
  Filled 2020-10-13 (×5): qty 0.08

## 2020-10-13 MED ORDER — NAPHAZOLINE-GLYCERIN 0.012-0.25 % OP SOLN
1.0000 [drp] | Freq: Four times a day (QID) | OPHTHALMIC | Status: DC | PRN
  Administered 2020-10-14: 2 [drp] via OPHTHALMIC
  Administered 2020-10-14: 1 [drp] via OPHTHALMIC
  Administered 2020-10-14: 2 [drp] via OPHTHALMIC
  Filled 2020-10-13: qty 15

## 2020-10-13 MED ORDER — INSULIN ASPART 100 UNIT/ML IJ SOLN
0.0000 [IU] | Freq: Three times a day (TID) | INTRAMUSCULAR | Status: DC
  Administered 2020-10-13: 15 [IU] via SUBCUTANEOUS
  Administered 2020-10-14: 3 [IU] via SUBCUTANEOUS
  Administered 2020-10-14: 4 [IU] via SUBCUTANEOUS
  Administered 2020-10-14: 3 [IU] via SUBCUTANEOUS
  Administered 2020-10-15: 4 [IU] via SUBCUTANEOUS
  Administered 2020-10-15: 3 [IU] via SUBCUTANEOUS
  Administered 2020-10-15 – 2020-10-16 (×2): 4 [IU] via SUBCUTANEOUS
  Administered 2020-10-16: 7 [IU] via SUBCUTANEOUS
  Administered 2020-10-16: 4 [IU] via SUBCUTANEOUS

## 2020-10-13 NOTE — Progress Notes (Signed)
Paged MD regarding telemetry  monitoring orders. No new orders given at this time

## 2020-10-13 NOTE — Consult Note (Signed)
[facility] Nurse Consult Note: Reason for Consult: Consult requested for right gluteal cleft. Performed remotely after review of progress notes. Bedside nurse reports this is a Stage 2 pressure injury.  These can be treated independently by the staff nurses using the standing skin care order set: Foam dressing to buttock/gluteal cleft, change Q 3 days or PRN soiling. Please re-consult if further assistance is needed.  Thank-you,  Julien Girt MSN, [place], [place], [place], [place]

## 2020-10-13 NOTE — NC FL2 (Signed)
[place] LEVEL OF CARE SCREENING TOOL     IDENTIFICATION  Patient Name: Jason Hartman Birthdate: 01-18-1936 Sex: male Admission Date (Current Location): 10/11/2020  [facility] and [place] Number:  Herbalist and Address:  [facility], [place] [address], [place], [place] [address]      Provider Number: [id number]  Attending Physician Name and Address:  Edwin Dada, *  Relative Name and Phone Number:  Curt Bears spouse [phone]    Current Level of Care: Hospital Recommended Level of Care: [place] Prior Approval Number:    Date Approved/Denied:   PASRR Number:    Discharge Plan: SNF    Current Diagnoses: Patient Active Problem List   Diagnosis Date Noted   Sepsis ([place]) 10/12/2020   AMS (altered mental status)    PAF (paroxysmal atrial fibrillation) ([place])    Acute metabolic encephalopathy 24/58/0998   Anxiety 09/09/2020   SIRS (systemic inflammatory response syndrome) ([place]) 09/09/2020   Diaphoresis    Slow transit constipation 08/07/2020   Abdominal aortic aneurysm without rupture ([place]) 03/17/2020   Abnormal gait 03/17/2020   Ascending aorta dilatation ([place]) 03/17/2020   Diabetic renal disease ([place]) 03/17/2020   Recurrent UTI 07/16/2019   Hypertensive urgency 04/26/2019   Malignant HTN with heart disease, w/o CHF, w/o chronic kidney disease 04/26/2019   Elevated troponin    LFTs abnormal    Statin intolerance 01/15/2019   Penis pain 04/10/2018   Urethra cancer ([place]) 02/25/2018   Lower extremity edema 06/09/2017   Peptic ulcer disease 05/16/2017   GERD (gastroesophageal reflux disease) 05/16/2017   Chronic kidney disease, stage 3a ([place]) 04/11/2017   Lower urinary tract symptoms (LUTS) 04/15/2016   Narcotic dependence ([place]) 08/31/2015   Imbalance 08/31/2015   Diabetic peripheral neuropathy ([place]) 08/31/2015   Other malaise and fatigue 05/02/2015   Chronic fatigue 05/02/2015   Panic disorder  without agoraphobia 03/22/2015   Hyperlipidemia 02/03/2014   Thrombocytopenia ([place]) 08/18/2013   Parkinsonism ([place]) 08/18/2013   Other disorders of lung 08/18/2013   Organic impotence 08/18/2013   Memory loss 08/18/2013   Low back pain 08/18/2013   Iron deficiency anemia 08/18/2013   Hypercalcemia 08/18/2013   Cellulitis of right leg 08/18/2013   Atherosclerotic heart disease of native coronary artery without angina pectoris 08/18/2013   RLS (restless legs syndrome) 09/01/2012   HERPES SIMPLEX INFECTION 11/06/2006   Type II diabetes mellitus ([place]) 11/06/2006   HYPOGONADISM 11/06/2006   VITAMIN D DEFICIENCY 11/06/2006   ANXIETY 11/06/2006   OBSTRUCTIVE SLEEP APNEA 11/06/2006   RESTLESS LEG SYNDROME 11/06/2006   HYPERTENSION 11/06/2006   BENIGN PROSTATIC HYPERTROPHY 11/06/2006   ROSACEA 11/06/2006   OSTEOARTHRITIS 11/06/2006   INSOMNIA 11/06/2006   HYPERGLYCEMIA 11/06/2006   COLONIC POLYPS, HX OF 11/06/2006    Orientation RESPIRATION BLADDER Height & Weight     Self, Time, Situation, Place  Normal Indwelling catheter Weight: 175 lb 14.8 oz (79.8 kg) Height:  5\' 4"  (162.6 cm)  BEHAVIORAL SYMPTOMS/MOOD NEUROLOGICAL BOWEL NUTRITION STATUS      Continent Diet  AMBULATORY STATUS COMMUNICATION OF NEEDS Skin       Other (Comment) (Stage 2 buttock/gluteal foam change every 3 days and PRN)                       Personal Care Assistance Level of Assistance  Bathing, Dressing, Feeding Bathing Assistance: Limited assistance Feeding assistance: Limited assistance Dressing Assistance: Limited assistance  Functional Limitations Info  Sight Sight Info: Adequate        SPECIAL CARE FACTORS FREQUENCY        PT Frequency: 5 times a week OT Frequency: 5 times a week            Contractures Contractures Info: Not present    Additional Factors Info  Code Status, Allergies Code Status Info: full Allergies Info: Oxycodone, Iodinated Diagnostic Agents, Iodine,  Linezolid, Methadone Hcl, Promethazine, Morphine Sulfate, Other, Oxycodone Hcl, Quetiapine, Statins, Zolpidem, Atorvastatin, Carbidopa-levodopa, Chlorhexidine Gluconate (Chlorhexidine), Clindamycin, Colesevelam, Doxazosin, Lovastatin, Rosuvastatin  Psychotropic Info:  (haldol)  Insulin Sliding Scale Info:  (SSI)   Insulin Sliding Scale Info: insulin aspart (novoLOG) injection 0-20 Units TID and bedtime       Current Medications (10/13/2020):  This is the current hospital active medication list Current Facility-Administered Medications  Medication Dose Route Frequency Provider Last Rate Last Admin   acetaminophen (TYLENOL) tablet 650 mg  650 mg Oral Q6H PRN Irene Pap N, DO       amiodarone (PACERONE) tablet 200 mg  200 mg Oral Daily Irene Pap N, DO   200 mg at 10/13/20 1003   apixaban (ELIQUIS) tablet 5 mg  5 mg Oral BID Irene Pap N, DO   5 mg at 10/13/20 1001   cefTRIAXone (ROCEPHIN) 2 g in sodium chloride 0.9 % 100 mL IVPB  2 g Intravenous Q24H Irene Pap N, DO   Stopped at 10/12/20 1826   DULoxetine (CYMBALTA) DR capsule 30 mg  30 mg Oral Daily Irene Pap N, DO   30 mg at 10/13/20 1002   ferrous sulfate tablet 325 mg  325 mg Oral Daily Kayleen Memos, DO   325 mg at 10/13/20 1004   gabapentin (NEURONTIN) capsule 300 mg  300 mg Oral QHS Irene Pap N, DO   300 mg at 10/12/20 2254   insulin aspart (novoLOG) injection 0-20 Units  0-20 Units Subcutaneous TID WC Danford, Suann Larry, MD       insulin aspart (novoLOG) injection 0-5 Units  0-5 Units Subcutaneous QHS Irene Pap N, DO   2 Units at 10/12/20 0051   insulin glargine (LANTUS) injection 8 Units  8 Units Subcutaneous Daily Edwin Dada, MD   8 Units at 10/13/20 1315   lactated ringers infusion   Intravenous Continuous Kathryne Eriksson, NP 50 mL/hr at 10/13/20 0754 New Bag at 10/13/20 0754   melatonin tablet 3 mg  3 mg Oral QHS PRN Kayleen Memos, DO       metoprolol tartrate (LOPRESSOR) tablet 12.5 mg  12.5 mg Oral  BID Irene Pap N, DO   12.5 mg at 10/13/20 1003   polyethylene glycol (MIRALAX / GLYCOLAX) packet 17 g  17 g Oral Daily PRN Irene Pap N, DO       prochlorperazine (COMPAZINE) injection 10 mg  10 mg Intravenous Q4H PRN Irene Pap N, DO       Rotigotine PT24 1 patch  1 patch Transdermal QPM Irene Pap N, DO   1 patch at 10/12/20 1716   tadalafil (CIALIS) tablet 5 mg  5 mg Oral Daily Danford, Suann Larry, MD   5 mg at 10/13/20 1017   thiamine tablet 100 mg  100 mg Oral Daily Irene Pap N, DO   100 mg at 10/13/20 1004   traMADol (ULTRAM) tablet 50 mg  50 mg Oral Q6H PRN Irene Pap N, DO   50 mg at 10/13/20 [id number]  Discharge Medications: Please see discharge summary for a list of discharge medications.  Relevant Imaging Results:  Relevant Lab Results:   Additional Information [place], [id number]

## 2020-10-13 NOTE — Progress Notes (Signed)
Paged MD regarding LR IV infusing expiring soon and pt has no fluids ordered to run. No new orders given at this time.

## 2020-10-13 NOTE — Progress Notes (Signed)
Pt. Refused cpap. 

## 2020-10-13 NOTE — Social Work (Signed)
To Whom It May Concern:  Please be advised that the above-named patient will require a short-term nursing home stay - anticipated 30 days or less for rehabilitation and strengthening.  The plan is for return home.  

## 2020-10-13 NOTE — Progress Notes (Addendum)
PROGRESS NOTE    [name]  WCB:[id number] DOB: 1935/11/05 DOA: 10/11/2020 PCP: [name], MD      Brief Narrative:  Jason Hartman is a 85 y.o. M with Parkinson's on Rotigotine, bad RLS, anxiety, pAF not on AC, OSA, DM, CKD IIIb, and chronic pain as well as urethral stricutre who presented with confusion.  Admitted 6/3 for one week for UTI with sepsis and delirium.  Had new pAF during that hospital stay, started on amio, had bleeding complications from penis so AC held.   Urology consulted, dilated urethra and placed foley.  Discharged with indwelling foley.  Treated for enterecoccus with Unasyn.  Since then, has been in rehab.  [facility] numerous times.  Finally, fell again today, was noted in the ER to be tachycardic, and persistently very confused.      Assessment & Plan:  Severe sepsis due to UTI The patient presented with fever, leukocytosis, sinus tachycardia, and confusion in the setting of UTI due to indwelling Foley.  Present on admission.  HR and BP normal, no further fever.  Culture of urine with GNRs. - Continue Rocephin - Stop ampicillin    Acute metabolic encephalopathy Patient at baseline is independent, has no memory loss, he was delirious during his last hospitalization with UTI, and is delirious again now.  He has some residual hypoactive delirium and slowed responses and disorientation.  Parkinson's disease -Continue rotigotine patch  BPH  Urethral stricture Maintain foley -Outpatient urology follow up -Continue tadalafil   Atrial flutter Rate is controlled, in sinus - Continue apixaban and amiodarone  Diabetes with hyperglycemia Glucoses elevated -Continue insulin, icnrease doses -Start lantus  AKI on CKD IIIb Cr 1.5 on admission, baseline 1.1.  Now back to baseline.  Chronic pain syndrome -Continue Cymbalta, gabapentin, tramadol  Iron deficiency anemia -Continue ferrous sulfate  Pressure injury Stage 2 right gluteal cleft, POA            Disposition: Status is: Inpatient  Remains inpatient appropriate because:IV treatments appropriate due to intensity of illness or inability to take PO and residual altered mental status  Dispo:  Patient From: [place]  Planned Disposition: [place]  Medically stable for discharge: No                MDM: The below labs and imaging reports reviewed and summarized above.  Medication management as above.    DVT prophylaxis:  apixaban (ELIQUIS) tablet 5 mg  Code Status: FULL Family Communication:              Subjective: Patient has generalized malaise, too weak to get up  Still has some slowed responses and confusion, but is redirectable and able to be oriented with prompting.  No fever overnight.  No headache.  No chest pain, no dyspnea, no abdominal pain.        Objective: Vitals:   10/12/20 2122 10/13/20 0021 10/13/20 0426 10/13/20 0816  BP: 139/82 133/69 120/64 112/67  Pulse: 85 68 77 90  Resp: 17 18 17 20   Temp: 97.8 F (36.6 C) (!) 97.3 F (36.3 C) 97.8 F (36.6 C) (!) 97.4 F (36.3 C)  TempSrc: Oral Oral Oral Oral  SpO2: 98% 96% 97% 96%  Weight:      Height:        Intake/Output Summary (Last 24 hours) at 10/13/2020 1114 Last data filed at 10/13/2020 0815 Gross per 24 hour  Intake 801.03 ml  Output 1500 ml  Net -698.97 ml  Filed Weights   10/11/20 2145  Weight: 79.8 kg    Examination: General appearance: Elderly adult male, glasses, lying in bed, appears debilitated and weak.  Interactive.     HEENT: Wearing eyeglasses.  Sclera anicteric, conjunctive and lids and lashes normal.  No nasal deformity, discharge, or epistaxis. Skin: Warm and dry, no suspicious rashes or lesions on the chest, arms, abdomen Cardiac: Regular rate and rhythm, no murmurs, no lower extremity edema Respiratory: Normal respiratory effort, lungs clear without rales or wheezes Abdomen: Abdomen soft without  tenderness palpation or guarding, no ascites or distention MSK:  Neuro: Awake, makes eye contact, alertness reduced, extraocular to movements intact, moves all extremities with severe generalized weakness but symmetric strength, speech fluent Psych: Psychomotor slowing noted, attention diminished, affect blunted, judgment insight appear moderately impaired.,  Oriented to self, place.     Data Reviewed: I have personally reviewed following labs and imaging studies:  CBC: Recent Labs  Lab 10/11/20 2006 10/12/20 0331 10/13/20 0100  WBC 11.3* 13.2* 12.2*  HGB 17.4* 15.6 14.2  HCT 53.0* 48.5 43.2  MCV 86.6 86.8 86.9  PLT 251 232 381   Basic Metabolic Panel: Recent Labs  Lab 10/11/20 2006 10/12/20 0331 10/13/20 0100  NA 135 139 133*  K 4.7 4.4 3.8  CL 99 105 99  CO2 20* 22 24  GLUCOSE 304* 191* 154*  BUN 22 22 23   CREATININE 1.47* 1.23 1.02  CALCIUM 9.8 9.2 8.6*  MG  --  1.7  --   PHOS  --  3.7  --    GFR: Estimated Creatinine Clearance: 53.2 mL/min (by C-G formula based on SCr of 1.02 mg/dL). Liver Function Tests: No results for input(s): AST, ALT, ALKPHOS, BILITOT, PROT, ALBUMIN in the last 168 hours. No results for input(s): LIPASE, AMYLASE in the last 168 hours. No results for input(s): AMMONIA in the last 168 hours. Coagulation Profile: No results for input(s): INR, PROTIME in the last 168 hours. Cardiac Enzymes: No results for input(s): CKTOTAL, CKMB, CKMBINDEX, TROPONINI in the last 168 hours. BNP (last 3 results) No results for input(s): PROBNP in the last 8760 hours. HbA1C: Recent Labs    10/12/20 0332  HGBA1C 7.1*   CBG: Recent Labs  Lab 10/12/20 0732 10/12/20 1210 10/12/20 1723 10/12/20 2136 10/13/20 0749  GLUCAP 182* 275* 216* 139* 308*   Lipid Profile: No results for input(s): CHOL, HDL, LDLCALC, TRIG, CHOLHDL, LDLDIRECT in the last 72 hours. Thyroid Function Tests: No results for input(s): TSH, T4TOTAL, FREET4, T3FREE, THYROIDAB in the  last 72 hours. Anemia Panel: No results for input(s): VITAMINB12, FOLATE, FERRITIN, TIBC, IRON, RETICCTPCT in the last 72 hours. Urine analysis:    Component Value Date/Time   COLORURINE YELLOW 10/11/2020 2016   APPEARANCEUR CLOUDY (A) 10/11/2020 2016   LABSPEC 1.011 10/11/2020 2016   PHURINE 5.0 10/11/2020 2016   GLUCOSEU >=500 (A) 10/11/2020 2016   HGBUR LARGE (A) 10/11/2020 2016   BILIRUBINUR NEGATIVE 10/11/2020 2016   KETONESUR NEGATIVE 10/11/2020 2016   PROTEINUR 100 (A) 10/11/2020 2016   UROBILINOGEN 0.2 12/04/2013 0210   NITRITE NEGATIVE 10/11/2020 2016   LEUKOCYTESUR LARGE (A) 10/11/2020 2016   Sepsis Labs: @LABRCNTIP (procalcitonin:4,lacticacidven:4)  ) Recent Results (from the past 240 hour(s))  Urine Culture     Status: Abnormal (Preliminary result)   Collection Time: 10/11/20  8:16 PM   Specimen: Urine, Random  Result Value Ref Range Status   Specimen Description URINE, RANDOM  Final   Special Requests   Final  NONE Performed at [facility], [place] [address], [place], [place] [address]    Culture >=100,000 COLONIES/mL KLEBSIELLA PNEUMONIAE (A)  Final   Report Status PENDING  Incomplete  Resp Panel by RT-PCR (Flu A&B, Covid) Nasopharyngeal Swab     Status: None   Collection Time: 10/12/20  1:55 AM   Specimen: Nasopharyngeal Swab; Nasopharyngeal(NP) swabs in vial transport medium  Result Value Ref Range Status   SARS Coronavirus 2 by RT PCR NEGATIVE NEGATIVE Final    Comment: (NOTE) SARS-CoV-2 target nucleic acids are NOT DETECTED.  The SARS-CoV-2 RNA is generally detectable in upper respiratory specimens during the acute phase of infection. The lowest concentration of SARS-CoV-2 viral copies this assay can detect is 138 copies/mL. A negative result does not preclude SARS-Cov-2 infection and should not be used as the sole basis for treatment or other patient management decisions. A negative result may occur with  improper specimen collection/handling,  submission of specimen other than nasopharyngeal swab, presence of viral mutation(s) within the areas targeted by this assay, and inadequate number of viral copies(<138 copies/mL). A negative result must be combined with clinical observations, patient history, and epidemiological information. The expected result is Negative.  Fact Sheet for Patients:  EntrepreneurPulse.com.au  Fact Sheet for Healthcare Providers:  IncredibleEmployment.be  This test is no t yet approved or cleared by the [place] FDA and  has been authorized for detection and/or diagnosis of SARS-CoV-2 by FDA under an Emergency Use Authorization (EUA). This EUA will remain  in effect (meaning this test can be used) for the duration of the COVID-19 declaration under Section 564(b)(1) of the Act, 21 U.S.C.section 360bbb-3(b)(1), unless the authorization is terminated  or revoked sooner.       Influenza A by PCR NEGATIVE NEGATIVE Final   Influenza B by PCR NEGATIVE NEGATIVE Final    Comment: (NOTE) The Xpert Xpress SARS-CoV-2/FLU/RSV plus assay is intended as an aid in the diagnosis of influenza from Nasopharyngeal swab specimens and should not be used as a sole basis for treatment. Nasal washings and aspirates are unacceptable for Xpert Xpress SARS-CoV-2/FLU/RSV testing.  Fact Sheet for Patients: EntrepreneurPulse.com.au  Fact Sheet for Healthcare Providers: IncredibleEmployment.be  This test is not yet approved or cleared by the [place] FDA and has been authorized for detection and/or diagnosis of SARS-CoV-2 by FDA under an Emergency Use Authorization (EUA). This EUA will remain in effect (meaning this test can be used) for the duration of the COVID-19 declaration under Section 564(b)(1) of the Act, 21 U.S.C. section 360bbb-3(b)(1), unless the authorization is terminated or revoked.  Performed at [facility], [place] [address] [place],  [address]          Radiology Studies: DG Thoracic Spine 2 View  Result Date: 10/11/2020 CLINICAL DATA:  Back pain, fall EXAM: THORACIC SPINE 2 VIEWS COMPARISON:  None. FINDINGS: Thoracic alignment within normal limits. Vertebral body heights are maintained. Mild degenerative osteophytes. IMPRESSION: No acute osseous abnormality Electronically Signed   By: Donavan Foil M.D.   On: 10/11/2020 21:51   DG Lumbar Spine Complete  Result Date: 10/11/2020 CLINICAL DATA:  Back pain, fall EXAM: LUMBAR SPINE - COMPLETE 4+ VIEW COMPARISON:  10/06/2020 FINDINGS: Stable lumbar alignment. Trace anterolisthesis L4 on L5. Vertebral body heights are maintained. Moderate disc space narrowing and degenerative change at L5-S1. Aortic atherosclerosis. Oval calcification is seen in left upper quadrant on one view only IMPRESSION: No acute osseous abnormality Electronically Signed   By: Maudie Mercury  Francoise Ceo M.D.   On: 10/11/2020 21:50   DG Knee 2 Views Left  Result Date: 10/11/2020 CLINICAL DATA:  Fall, bilateral knee pain. EXAM: LEFT KNEE - 1-2 VIEW COMPARISON:  None. FINDINGS: No evidence of fracture, dislocation, or joint effusion. Normal joint spaces. Advanced vascular calcifications. Soft tissues are unremarkable. IMPRESSION: 1. No fracture or subluxation of the left knee. 2. Advanced vascular calcifications. Electronically Signed   By: Keith Rake M.D.   On: 10/11/2020 20:44   DG Knee 2 Views Right  Result Date: 10/11/2020 CLINICAL DATA:  Post fall with bilateral knee pain. EXAM: RIGHT KNEE - 1-2 VIEW COMPARISON:  None. FINDINGS: No evidence of fracture, dislocation, or joint effusion. Normal joint spaces. Prominent vascular calcifications. Soft tissues are unremarkable. IMPRESSION: 1. No fracture or subluxation of the right knee. 2. Prominent vascular calcifications. Electronically Signed   By: Keith Rake M.D.   On: 10/11/2020 20:43   CT Head Wo Contrast  Result Date:  10/11/2020 CLINICAL DATA:  Fall EXAM: CT HEAD WITHOUT CONTRAST CT CERVICAL SPINE WITHOUT CONTRAST TECHNIQUE: Multidetector CT imaging of the head and cervical spine was performed following the standard protocol without intravenous contrast. Multiplanar CT image reconstructions of the cervical spine were also generated. COMPARISON:  10/06/2020 head CT and cervical spine CT FINDINGS: CT HEAD FINDINGS Brain: There is no mass, hemorrhage or extra-axial collection. There is generalized atrophy without lobar predilection. There is hypoattenuation of the periventricular white matter, most commonly indicating chronic ischemic microangiopathy. Vascular: Atherosclerotic calcification of the internal carotid arteries at the skull base. No abnormal hyperdensity of the major intracranial arteries or dural venous sinuses. Skull: The visualized skull base, calvarium and extracranial soft tissues are normal. Sinuses/Orbits: No fluid levels or advanced mucosal thickening of the visualized paranasal sinuses. No mastoid or middle ear effusion. The orbits are normal. CT CERVICAL SPINE FINDINGS Cervical spine imaging is severely degraded by motion. Within that limitation, there is no acute fracture. Alignment is grossly normal. IMPRESSION: 1. Chronic ischemic microangiopathy and generalized atrophy without acute intracranial abnormality. 2. Cervical spine imaging is severely degraded by motion. Within that limitation, there is no acute fracture or static subluxation of the cervical spine. Electronically Signed   By: Ulyses Jarred M.D.   On: 10/11/2020 21:10   CT Cervical Spine Wo Contrast  Result Date: 10/11/2020 CLINICAL DATA:  Fall EXAM: CT HEAD WITHOUT CONTRAST CT CERVICAL SPINE WITHOUT CONTRAST TECHNIQUE: Multidetector CT imaging of the head and cervical spine was performed following the standard protocol without intravenous contrast. Multiplanar CT image reconstructions of the cervical spine were also generated. COMPARISON:   10/06/2020 head CT and cervical spine CT FINDINGS: CT HEAD FINDINGS Brain: There is no mass, hemorrhage or extra-axial collection. There is generalized atrophy without lobar predilection. There is hypoattenuation of the periventricular white matter, most commonly indicating chronic ischemic microangiopathy. Vascular: Atherosclerotic calcification of the internal carotid arteries at the skull base. No abnormal hyperdensity of the major intracranial arteries or dural venous sinuses. Skull: The visualized skull base, calvarium and extracranial soft tissues are normal. Sinuses/Orbits: No fluid levels or advanced mucosal thickening of the visualized paranasal sinuses. No mastoid or middle ear effusion. The orbits are normal. CT CERVICAL SPINE FINDINGS Cervical spine imaging is severely degraded by motion. Within that limitation, there is no acute fracture. Alignment is grossly normal. IMPRESSION: 1. Chronic ischemic microangiopathy and generalized atrophy without acute intracranial abnormality. 2. Cervical spine imaging is severely degraded by motion. Within that limitation, there is no acute fracture or  static subluxation of the cervical spine. Electronically Signed   By: Ulyses Jarred M.D.   On: 10/11/2020 21:10   DG Chest Portable 1 View  Result Date: 10/11/2020 CLINICAL DATA:  Fall EXAM: PORTABLE CHEST 1 VIEW COMPARISON:  09/16/2020, CT 12/21/2019, radiograph 05/02/2016 FINDINGS: Diminished lung volumes with elevation of right diaphragm. Streaky atelectasis at the right base. No consolidation, pleural effusion or pneumothorax. Stable cardiomediastinal silhouette. IMPRESSION: Low lung volumes with elevated right diaphragm and subsegmental atelectasis at the right base. Electronically Signed   By: Donavan Foil M.D.   On: 10/11/2020 21:26   CT Renal Stone Study  Result Date: 10/12/2020 CLINICAL DATA:  Hematuria EXAM: CT ABDOMEN AND PELVIS WITHOUT CONTRAST TECHNIQUE: Multidetector CT imaging of the abdomen and  pelvis was performed following the standard protocol without IV contrast. COMPARISON:  09/14/2020 FINDINGS: Motion degraded images. Lower chest: Mild subpleural reticulation in the lungs bilaterally, likely post infectious/inflammatory. Hepatobiliary: Unenhanced liver is unremarkable. Gallbladder is unremarkable. No intrahepatic or extrahepatic ductal dilatation. Pancreas: Within normal limits. Spleen: Within normal limits. Adrenals/Urinary Tract: Adrenal glands are within normal limits. Two nonobstructing right upper pole renal calculi measuring up to 2 mm (series 3/image 43), unchanged. Additional bilateral renal cysts, including a 16 mm hyperdense probable cyst in the lateral interpolar left kidney (series 3/image 52), poorly evaluated on unenhanced CT. No hydronephrosis. Bladder is decompressed by an indwelling Foley catheter. Stomach/Bowel: Stomach is within normal limits. No evidence of bowel obstruction. Normal appendix (series 3/image 70). Mild sigmoid diverticulosis, without evidence of diverticulitis. Vascular/Lymphatic: No evidence of abdominal aortic aneurysm. Atherosclerotic calcifications of the abdominal aorta and branch vessels. No suspicious abdominopelvic lymphadenopathy. Reproductive: Prostate is unremarkable. Other: No abdominopelvic ascites. Musculoskeletal: Degenerative changes of the visualized thoracolumbar spine. IMPRESSION: Motion degraded images. Two nonobstructing right upper pole renal calculi measuring up to 2 mm, unchanged. No hydronephrosis. Bilateral renal cysts, poorly evaluated, including a 16 mm hyperdense probable cyst in the lateral interpolar left kidney. This previously measured 10 mm in 2015, supporting a benign etiology. Bladder decompressed by indwelling Foley catheter. Electronically Signed   By: Julian Hy M.D.   On: 10/12/2020 01:56   DG HIP UNILAT WITH PELVIS 2-3 VIEWS LEFT  Result Date: 10/11/2020 CLINICAL DATA:  Fall on blood thinners. EXAM: DG HIP (WITH OR  WITHOUT PELVIS) 2-3V LEFT COMPARISON:  Abdomen 01/11/2015 FINDINGS: Catheter projected over the midline likely representing a Foley catheter. Pelvis and left hip appear intact. No acute displaced fractures identified. Vascular calcifications. SI joints and symphysis pubis are not displaced. IMPRESSION: No acute displaced fractures identified. Electronically Signed   By: Lucienne Capers M.D.   On: 10/11/2020 20:48   DG HIP UNILAT WITH PELVIS 2-3 VIEWS RIGHT  Result Date: 10/11/2020 CLINICAL DATA:  [facility] on blood thinners. EXAM: DG HIP (WITH OR WITHOUT PELVIS) 2-3V RIGHT COMPARISON:  10/06/2020 FINDINGS: Foley catheter. Right hip appears intact. No evidence of acute fracture or dislocation. No focal bone lesion or bone destruction. Mild degenerative changes in the hips. Vascular calcifications. IMPRESSION: No acute displaced fractures identified. Electronically Signed   By: Lucienne Capers M.D.   On: 10/11/2020 20:49        Scheduled Meds:  amiodarone  200 mg Oral Daily   apixaban  5 mg Oral BID   DULoxetine  30 mg Oral Daily   ferrous sulfate  325 mg Oral Daily   gabapentin  300 mg Oral QHS   insulin aspart  0-15 Units Subcutaneous TID WC   insulin aspart  0-5 Units Subcutaneous QHS   metoprolol tartrate  12.5 mg Oral BID   Rotigotine  1 patch Transdermal QPM   tadalafil  5 mg Oral Daily   thiamine  100 mg Oral Daily   Continuous Infusions:  cefTRIAXone (ROCEPHIN)  IV Stopped (10/12/20 1826)   lactated ringers 50 mL/hr at 10/13/20 0754     LOS: 2 days    Time spent: 25  minutes    Edwin Dada, MD [facility] 10/13/2020, 11:14 AM     Please page though [facility] or Epic secure chat:  For password, contact charge nurse

## 2020-10-13 NOTE — TOC Initial Note (Signed)
Transition of Care [facility]) - Initial/Assessment Note    Patient Details  Name: Jason Hartman MRN: [id number] Date of Birth: 10/19/1935  Transition of Care [facility]) CM/SW Contact:    Coralee Pesa, [place] Phone Number: 10/13/2020, 4:52 PM  Clinical Narrative:                 CSW met with pt at bedside to discuss DC plans. Pt noted that he is from [place], but absolutely does not want to go back. He requests his wife be contacted to discuss further. CSW spoke with Juliann Pulse by phone who also stated she did not want him returning to [facility], and would like anywhere else, but preferably [facility] or [place]. CSW sent referrals, and [facility] and [facility] are reviewing, but they will not have beds available over the weekend. MD advised they could not hold pt over the weekend, as he already had a SNF bed. CSW relayed this information to pt and spouse, CSW offered the options of going back to [facility], and seeing if [facility] would accept a transfer, or going home with [facility]. Pt and spouse became tearful and pt stated he was afraid he would die if he went back to [facility]. Wife asked he be faxed out further and CSW faxed pt everywhere. Pt and spouse very fearful of going home as spouse cannot care for pt, and afraid of going back to facility. They were advised that [facility] had not officially accepted yet, so if they decide to go back to facility they may not be able to transfer. If they decide to go home, they cannot DC from home to a SNF.  CSW offered emotional support, and stated she would follow up tomorrow for dc planning. CSW spoke with Claiborne Billings from [facility] who stated she would look into the situation to see what they can do to assist the pt. Claiborne Billings said there was an incident that has been resolved and the nurse in question no longer worked there. Claiborne Billings said she would do her best to facilitate the transfer if possible. SW will follow back up with spouse tomorrow to determine next level of care.  Expected  Discharge Plan: Skilled Nursing Facility Barriers to Discharge: Continued Medical Work up, Other (must enter comment) (Unsure of disposition at this time.)   Patient Goals and CMS Choice Patient states their goals for this hospitalization and ongoing recovery are:: Pt and family are deciding between going home and returning to facility. CMS Medicare.gov Compare Post Acute Care list provided to:: Patient Choice offered to / list presented to : Patient  Expected Discharge Plan and Services Expected Discharge Plan: [place] Choice: [place] Living arrangements for the past 2 months: [place]                                      Prior Living Arrangements/Services Living arrangements for the past 2 months: [place] Lives with:: Facility Resident Patient language and need for interpreter reviewed:: Yes Do you feel safe going back to the place where you live?: No   Pt and wife state they do not want pt returning to [facility], as it is unsafe.  Need for Family Participation in Patient Care: Yes (Comment) Care giver support system in place?: Yes (comment)   Criminal Activity/Legal Involvement Pertinent to Current Situation/Hospitalization: No -  Comment as needed  Activities of Daily Living   ADL Screening (condition at time of admission) Is the patient deaf or have difficulty hearing?: Yes Does the patient have difficulty seeing, even when wearing glasses/contacts?: No Does the patient have difficulty concentrating, remembering, or making decisions?: Yes Does the patient have difficulty dressing or bathing?: Yes Does the patient have difficulty walking or climbing stairs?: Yes  Permission Sought/Granted Permission sought to share information with : Family Supports Permission granted to share information with : Yes, Verbal Permission Granted  Share Information with NAME: Sheron Robin     Permission  granted to share info w Relationship: Spouse  Permission granted to share info w Contact Information: [phone]  Emotional Assessment Appearance:: Appears stated age Attitude/Demeanor/Rapport: Engaged Affect (typically observed): Tearful/Crying Orientation: : Oriented to Self, Oriented to Place, Oriented to  Time, Oriented to Situation Alcohol / Substance Use: Not Applicable Psych Involvement: No (comment)  Admission diagnosis:  Fall [W19.XXXA] Acute cystitis without hematuria [N30.00] Fall, initial encounter [W19.XXXA] Sepsis ([place]) [A41.9] AMS (altered mental status) [R41.82] Patient Active Problem List   Diagnosis Date Noted   Sepsis ([place]) 10/12/2020   AMS (altered mental status)    PAF (paroxysmal atrial fibrillation) ([place])    Acute metabolic encephalopathy 09/73/5329   Anxiety 09/09/2020   SIRS (systemic inflammatory response syndrome) ([place]) 09/09/2020   Diaphoresis    Slow transit constipation 08/07/2020   Abdominal aortic aneurysm without rupture ([place]) 03/17/2020   Abnormal gait 03/17/2020   Ascending aorta dilatation ([place]) 03/17/2020   Diabetic renal disease ([place]) 03/17/2020   Recurrent UTI 07/16/2019   Hypertensive urgency 04/26/2019   Malignant HTN with heart disease, w/o CHF, w/o chronic kidney disease 04/26/2019   Elevated troponin    LFTs abnormal    Statin intolerance 01/15/2019   Penis pain 04/10/2018   Urethra cancer ([place]) 02/25/2018   Lower extremity edema 06/09/2017   Peptic ulcer disease 05/16/2017   GERD (gastroesophageal reflux disease) 05/16/2017   Chronic kidney disease, stage 3a ([place]) 04/11/2017   Lower urinary tract symptoms (LUTS) 04/15/2016   Narcotic dependence ([place]) 08/31/2015   Imbalance 08/31/2015   Diabetic peripheral neuropathy ([place]) 08/31/2015   Other malaise and fatigue 05/02/2015   Chronic fatigue 05/02/2015   Panic disorder without agoraphobia 03/22/2015   Hyperlipidemia 02/03/2014   Thrombocytopenia ([place]) 08/18/2013    Parkinsonism ([place]) 08/18/2013   Other disorders of lung 08/18/2013   Organic impotence 08/18/2013   Memory loss 08/18/2013   Low back pain 08/18/2013   Iron deficiency anemia 08/18/2013   Hypercalcemia 08/18/2013   Cellulitis of right leg 08/18/2013   Atherosclerotic heart disease of native coronary artery without angina pectoris 08/18/2013   RLS (restless legs syndrome) 09/01/2012   HERPES SIMPLEX INFECTION 11/06/2006   Type II diabetes mellitus ([place]) 11/06/2006   HYPOGONADISM 11/06/2006   VITAMIN D DEFICIENCY 11/06/2006   ANXIETY 11/06/2006   OBSTRUCTIVE SLEEP APNEA 11/06/2006   RESTLESS LEG SYNDROME 11/06/2006   HYPERTENSION 11/06/2006   BENIGN PROSTATIC HYPERTROPHY 11/06/2006   ROSACEA 11/06/2006   OSTEOARTHRITIS 11/06/2006   INSOMNIA 11/06/2006   HYPERGLYCEMIA 11/06/2006   COLONIC POLYPS, HX OF 11/06/2006   PCP:  [name], MD Pharmacy:   [facility] - [place], [place] - [address] [address] [facility] [address] [place] [address] Phone: [phone] Fax: [id number] - [place], [place] - [address] AT [place] [address] [place]  [address] Phone: [phone] Fax: [phone]     Social Determinants of Health (SDOH) Interventions    Readmission Risk Interventions No flowsheet data found.

## 2020-10-13 NOTE — Progress Notes (Signed)
RT note. Patient refuse cpap for the night

## 2020-10-13 NOTE — Evaluation (Signed)
Physical Therapy Evaluation Patient Details Name: Jason Hartman MRN: [id number] DOB: 1935-11-09 Today's Date: 10/13/2020   History of Present Illness  Jason Hartman is a 85 y.o. male with medical history significant for paroxysmal A. fib on Eliquis, OSA, type 2 diabetes, chronic anxiety/depression, coronary artery disease, hypertension, hyperlipidemia, bladder cancer, BPH, chronic indwelling Foley catheter, hypogonadism, Parkinson disease, restless leg syndrome, chronic low back pain without sciatica, diabetic polyneuropathy, insomnia, who presented to [facility] ED from SNF due to confusion and possible unwitnessed fall.  Patient was found on the ground by staff. Found to have UTI.   Clinical Impression  Patient received in bed, requesting to be repositioned. Despite encouragement patient declined getting oob at this time. He required max +2 assist for repositioning and rolling in bed. He can assist with R UE grabbing rail. Patient reports pain in left shoulder and back pain with mobility. He will continue to benefit from skilled PT while here to improve functional independence.     Follow Up Recommendations SNF    Equipment Recommendations  None recommended by PT    Recommendations for Other Services       Precautions / Restrictions Precautions Precautions: Fall Restrictions Weight Bearing Restrictions: No      Mobility  Bed Mobility Overal bed mobility: Needs Assistance Bed Mobility: Rolling Rolling: +2 for physical assistance;Max assist         General bed mobility comments: patient required max +2 assist for re-positioning in bed and for rolling. Declines supine to sit at this time.    Transfers                 General transfer comment: patient declines at this time  Ambulation/Gait                Stairs            Wheelchair Mobility    Modified Rankin (Stroke Patients Only)       Balance                                              Pertinent Vitals/Pain Pain Assessment: Faces Faces Pain Scale: Hurts little more Pain Location: L shoulder and back Pain Descriptors / Indicators: Aching;Discomfort;Sore;Guarding;Grimacing Pain Intervention(s): Limited activity within patient's tolerance;Monitored during session;Repositioned    Home Living Family/patient expects to be discharged to:: Skilled nursing facility                      Prior Function Level of Independence: Needs assistance   Gait / Transfers Assistance Needed: patient reports at SNF he is able to take a few steps to chair with assistance.           Hand Dominance        Extremity/Trunk Assessment   Upper Extremity Assessment Upper Extremity Assessment: Generalized weakness;LUE deficits/detail LUE Deficits / Details: states he needs his shoulder replaced. Not functional LUE: Unable to fully assess due to pain LUE Coordination: decreased gross motor    Lower Extremity Assessment Lower Extremity Assessment: Generalized weakness       Communication   Communication: No difficulties  Cognition Arousal/Alertness: Awake/alert   Overall Cognitive Status: Within Functional Limits for tasks assessed  General Comments      Exercises     Assessment/Plan    PT Assessment Patient needs continued PT services  PT Problem List Decreased strength;Decreased mobility;Decreased activity tolerance;Decreased balance;Decreased knowledge of use of DME;Pain       PT Treatment Interventions Therapeutic exercise;Gait training;Balance training;Functional mobility training;Therapeutic activities;Patient/family education;DME instruction    PT Goals (Current goals can be found in the Care Plan section)  Acute Rehab PT Goals Patient Stated Goal: return to snf "not the one I came from" PT Goal Formulation: With patient Time For Goal Achievement: 10/27/20 Potential to Achieve Goals:  Fair    Frequency Min 2X/week   Barriers to discharge        Co-evaluation               AM-PAC PT "6 Clicks" Mobility  Outcome Measure Help needed turning from your back to your side while in a flat bed without using bedrails?: A Lot Help needed moving from lying on your back to sitting on the side of a flat bed without using bedrails?: A Lot Help needed moving to and from a bed to a chair (including a wheelchair)?: Total Help needed standing up from a chair using your arms (e.g., wheelchair or bedside chair)?: Total Help needed to walk in hospital room?: Total Help needed climbing 3-5 steps with a railing? : Total 6 Click Score: 8    End of Session   Activity Tolerance: Patient limited by pain;Patient limited by fatigue Patient left: in bed;with bed alarm set;with call bell/phone within reach Nurse Communication: Mobility status PT Visit Diagnosis: Muscle weakness (generalized) (M62.81);Other abnormalities of gait and mobility (R26.89);Pain Pain - Right/Left: Left Pain - part of body: Shoulder (back)    Time: 3888-2800 PT Time Calculation (min) (ACUTE ONLY): 20 min   Charges:   PT Evaluation $PT Eval Moderate Complexity: 1 Mod PT Treatments $Therapeutic Activity: 8-22 mins        Shuan Statzer, PT, GCS 10/13/20,10:58 AM

## 2020-10-13 NOTE — Progress Notes (Signed)
OT Cancellation Note  Patient Details Name: CARLEE TESFAYE MRN: [id number] DOB: [birth year]   Cancelled Treatment:    Reason Eval/Treat Not Completed: Patient declined, no reason specified. Pt anxious and stating that he is not doing well and needs a doctor called. Family member in with pt trying to calm pt down. OT will follow up next available time  Britt Bottom 10/13/2020, 3:15 PM

## 2020-10-14 DIAGNOSIS — R6520 Severe sepsis without septic shock: Secondary | ICD-10-CM

## 2020-10-14 DIAGNOSIS — G9340 Encephalopathy, unspecified: Secondary | ICD-10-CM

## 2020-10-14 DIAGNOSIS — F419 Anxiety disorder, unspecified: Secondary | ICD-10-CM

## 2020-10-14 DIAGNOSIS — R410 Disorientation, unspecified: Secondary | ICD-10-CM

## 2020-10-14 DIAGNOSIS — I4892 Unspecified atrial flutter: Secondary | ICD-10-CM

## 2020-10-14 LAB — CBC
HCT: 41.3 % (ref 39.0–52.0)
Hemoglobin: 13.6 g/dL (ref 13.0–17.0)
MCH: 28.7 pg (ref 26.0–34.0)
MCHC: 32.9 g/dL (ref 30.0–36.0)
MCV: 87.1 fL (ref 80.0–100.0)
Platelets: 190 10*3/uL (ref 150–400)
RBC: 4.74 MIL/uL (ref 4.22–5.81)
RDW: 14.2 % (ref 11.5–15.5)
WBC: 12.4 10*3/uL — ABNORMAL HIGH (ref 4.0–10.5)
nRBC: 0 % (ref 0.0–0.2)

## 2020-10-14 LAB — BASIC METABOLIC PANEL
Anion gap: 7 (ref 5–15)
BUN: 22 mg/dL (ref 8–23)
CO2: 24 mmol/L (ref 22–32)
Calcium: 8.4 mg/dL — ABNORMAL LOW (ref 8.9–10.3)
Chloride: 102 mmol/L (ref 98–111)
Creatinine, Ser: 0.96 mg/dL (ref 0.61–1.24)
GFR, Estimated: >60 mL/min (ref >60)
Glucose, Bld: 97 mg/dL (ref 70–99)
Potassium: 3.9 mmol/L (ref 3.5–5.1)
Sodium: 133 mmol/L — ABNORMAL LOW (ref 135–145)

## 2020-10-14 LAB — GLUCOSE, CAPILLARY
Glucose-Capillary: 124 mg/dL — ABNORMAL HIGH (ref 70–99)
Glucose-Capillary: 174 mg/dL — ABNORMAL HIGH (ref 70–99)
Glucose-Capillary: 145 mg/dL — ABNORMAL HIGH (ref 70–99)
Glucose-Capillary: 170 mg/dL — ABNORMAL HIGH (ref 70–99)

## 2020-10-14 LAB — URINE CULTURE: Culture: >=100000 — AB

## 2020-10-14 MED ORDER — AMITRIPTYLINE HCL 10 MG PO TABS
10.0000 mg | ORAL_TABLET | Freq: Every day | ORAL | Status: DC
  Administered 2020-10-14 – 2020-10-15 (×2): 10 mg via ORAL
  Filled 2020-10-14 (×4): qty 1

## 2020-10-14 MED ORDER — SENNOSIDES-DOCUSATE SODIUM 8.6-50 MG PO TABS
1.0000 | ORAL_TABLET | Freq: Every day | ORAL | Status: DC
  Administered 2020-10-14 – 2020-10-15 (×2): 1 via ORAL
  Filled 2020-10-14: qty 1

## 2020-10-14 MED ORDER — SODIUM CHLORIDE 0.9 % IV SOLN
2.0000 g | Freq: Three times a day (TID) | INTRAVENOUS | Status: DC
  Administered 2020-10-14 – 2020-10-16 (×6): 2 g via INTRAVENOUS
  Filled 2020-10-14 (×8): qty 2

## 2020-10-14 MED ORDER — DULOXETINE HCL 20 MG PO CPEP
40.0000 mg | ORAL_CAPSULE | Freq: Every day | ORAL | Status: DC
  Administered 2020-10-15 – 2020-10-16 (×2): 40 mg via ORAL
  Filled 2020-10-14 (×2): qty 2

## 2020-10-14 MED ORDER — BISACODYL 10 MG RE SUPP: 10.0000 mg | Freq: Every day | RECTAL | Status: DC | PRN

## 2020-10-14 NOTE — Evaluation (Signed)
Occupational Therapy Evaluation Patient Details Name: Jason Hartman MRN: [id number] DOB: 05-07-1935 Today's Date: 10/14/2020    History of Present Illness Jason Hartman is a 85 y.o. male with medical history significant for paroxysmal A. fib on Eliquis, OSA, type 2 diabetes, chronic anxiety/depression, coronary artery disease, hypertension, hyperlipidemia, bladder cancer, BPH, chronic indwelling Foley catheter, hypogonadism, Parkinson disease, restless leg syndrome, chronic low back pain without sciatica, diabetic polyneuropathy, insomnia, who presented to [facility] ED from SNF due to confusion and possible unwitnessed fall.  Patient was found on the ground by staff. Found to have UTI.   Clinical Impression   Pt admitted for concerns listed above. PTA pt was receiving rehab at a local SNF, requiring mod-max A for all ADL's and mobility. At this time, pt requiring max A- total A +2 for most ADL's and all functional mobility. Pt presenting as very self limiting, reporting he can not complete things such as grooming and self feeding while sitting up supported in bed, however OT observed him feeding himself and wiping his face with little difficulty. Pt will benefit from continued skilled care at a SNF, while in the hospital, acute OT will continue to follow to assist with addressing concerns listed below.     Follow Up Recommendations  SNF;Supervision/Assistance - 24 hour    Equipment Recommendations  None recommended by OT    Recommendations for Other Services       Precautions / Restrictions Precautions Precautions: Fall Restrictions Weight Bearing Restrictions: No      Mobility Bed Mobility Overal bed mobility: Needs Assistance Bed Mobility: Sit to Supine Rolling: Total assist;+2 for physical assistance;+2 for safety/equipment     Sit to supine: Max assist;+2 for physical assistance;+2 for safety/equipment   General bed mobility comments: Pt required total for supine scoot +2, and Max  +2 for BLE management and trunk control returning to supine    Transfers Overall transfer level: Needs assistance Equipment used: 2 person hand held assist Transfers: Sit to/from [facility] Sit to Stand: Max assist;+2 physical assistance;+2 safety/equipment Stand pivot transfers: Max assist;+2 safety/equipment;+2 physical assistance       General transfer comment: Pt declines using stedy despite education that it could be beneficial for safe transfer. Requiring max +2 A for all standing and transferring. Pt fearful of falling.    Balance Overall balance assessment: Needs assistance Sitting-balance support: Feet supported;Bilateral upper extremity supported Sitting balance-Leahy Scale: Poor   Postural control: Posterior lean Standing balance support: Bilateral upper extremity supported Standing balance-Leahy Scale: Poor Standing balance comment: Pt requires heavy support from therapist and NT                           ADL either performed or assessed with clinical judgement   ADL Overall ADL's : Needs assistance/impaired Eating/Feeding: Moderate assistance;Bed level   Grooming: Moderate assistance;Bed level   Upper Body Bathing: Moderate assistance;Maximal assistance;Bed level   Lower Body Bathing: Total assistance;+2 for physical assistance;+2 for safety/equipment;Bed level   Upper Body Dressing : Moderate assistance;Bed level   Lower Body Dressing: Total assistance;+2 for physical assistance;+2 for safety/equipment;Bed level   Toilet Transfer: Maximal assistance;+2 for physical assistance;+2 for safety/equipment   Toileting- Clothing Manipulation and Hygiene: Maximal assistance;Total assistance;+2 for physical assistance;+2 for safety/equipment;Bed level   Tub/ Shower Transfer: Maximal assistance;+2 for physical assistance;+2 for safety/equipment;Stand-pivot   Functional mobility during ADLs: Maximal assistance;+2 for physical assistance;+2  for safety/equipment General ADL Comments: Pt appears very  self limiting reporting that he can't do anything, including feed himself, asking OT to do everything for him. OT left the room to grab some equipment, on return, pt was feeding himself and wiping his face. Pt immediately put the food down and asked OT to cut his food up and feed him. Upon assessment of ROM and MMT for all ADL's unsure if pt is limited due to weakness vs. effort.     Vision Baseline Vision/History: Wears glasses Wears Glasses: At all times Patient Visual Report: No change from baseline Vision Assessment?: No apparent visual deficits     Perception Perception Perception Tested?: No   Praxis Praxis Praxis tested?: Not tested    Pertinent Vitals/Pain Pain Assessment: Faces Faces Pain Scale: Hurts little more Pain Location: L shoulder and back Pain Descriptors / Indicators: Aching;Discomfort;Sore;Guarding;Grimacing Pain Intervention(s): Limited activity within patient's tolerance;Monitored during session;Repositioned;Patient requesting pain meds-RN notified     Hand Dominance Right   Extremity/Trunk Assessment Upper Extremity Assessment Upper Extremity Assessment: Generalized weakness;LUE deficits/detail LUE Deficits / Details: states he needs his shoulder replaced. Not functional LUE Sensation: decreased light touch LUE Coordination: decreased fine motor;decreased gross motor   Lower Extremity Assessment Lower Extremity Assessment: Defer to PT evaluation   Cervical / Trunk Assessment Cervical / Trunk Assessment: Kyphotic   Communication Communication Communication: No difficulties   Cognition Arousal/Alertness: Awake/alert Behavior During Therapy: Anxious;Agitated Overall Cognitive Status: No family/caregiver present to determine baseline cognitive functioning                                     General Comments  VSS on RA    Exercises     Shoulder Instructions      Home  Living Family/patient expects to be discharged to:: Skilled nursing facility                                        Prior Functioning/Environment Level of Independence: Needs assistance  Gait / Transfers Assistance Needed: patient reports at SNF he is able to take a few steps to chair with assistance. ADL's / Homemaking Assistance Needed: Pt needs mod-max A for all ADL's            OT Problem List: Decreased strength;Decreased range of motion;Decreased activity tolerance;Impaired balance (sitting and/or standing);Decreased coordination;Decreased cognition;Decreased safety awareness;Decreased knowledge of use of DME or AE;Impaired UE functional use      OT Treatment/Interventions: Self-care/ADL training;Therapeutic exercise;Energy conservation;DME and/or AE instruction;Therapeutic activities;Cognitive remediation/compensation;Patient/family education;Balance training    OT Goals(Current goals can be found in the care plan section) Acute Rehab OT Goals Patient Stated Goal: return to snf "not the one I came from" OT Goal Formulation: With patient Time For Goal Achievement: 10/28/20 Potential to Achieve Goals: Fair ADL Goals Pt Will Perform Eating: with min assist Pt Will Perform Grooming: with min assist;sitting Pt Will Transfer to Toilet: stand pivot transfer;with mod assist Additional ADL Goal #1: Pt will complete bed mobility with min A Additional ADL Goal #2: Pt will tolerate sitting EOB for 3 mins to complete grooming tasks.  OT Frequency: Min 2X/week   Barriers to D/C:    Pt and family do not want him returning to same SNF he was at.       Co-evaluation  AM-PAC OT "6 Clicks" Daily Activity     Outcome Measure Help from another person eating meals?: A Lot Help from another person taking care of personal grooming?: A Lot Help from another person toileting, which includes using toliet, bedpan, or urinal?: Total Help from another person  bathing (including washing, rinsing, drying)?: A Lot Help from another person to put on and taking off regular upper body clothing?: A Lot Help from another person to put on and taking off regular lower body clothing?: Total 6 Click Score: 10   End of Session    Activity Tolerance:   Patient left:    OT Visit Diagnosis: Unsteadiness on feet (R26.81);Other abnormalities of gait and mobility (R26.89);Muscle weakness (generalized) (M62.81)                Time: 6734-1937 OT Time Calculation (min): 22 min Charges:  OT General Charges $OT Visit: 1 Visit OT Evaluation $OT Eval Moderate Complexity: 1 Mod  Ludell Zacarias H., OTR/L Acute Rehabilitation  Burnie Therien Elane Yolanda Bonine 10/14/2020, 10:57 AM

## 2020-10-14 NOTE — Progress Notes (Signed)
PROGRESS NOTE    Jason Hartman  JYN:[id number] DOB: 10-Jul-1935 DOA: 10/11/2020 PCP: [name], MD      Brief Narrative:  Jason Hartman is a 85 y.o. M with Parkinson's on Rotigotine, bad RLS, anxiety, pAF not on AC, OSA, DM, CKD IIIb, and chronic pain as well as urethral stricutre who presented with confusion.  Admitted 6/3 for one week for UTI with sepsis and delirium.  Had new pAF during that hospital stay, started on amio, had bleeding complications from penis so AC held.   Urology consulted, dilated urethra and placed foley.  Discharged with indwelling foley.  Treated for enterecoccus with Unasyn.  Since then, has been in rehab.  [facility] numerous times.  Finally, fell again today, was noted in the ER to be tachycardic, and persistently very confused.        Assessment & Plan:  Severe sepsis due to UTI The patient presented with fever, leukocytosis, sinus tachycardia, and confusion in the setting of UTI due to indwelling Foley.  Present on admission.  Urine culture growing ESBL Klebsilla today Clinically not much better - Stop Rocephin - Start meropenem    Acute metabolic encephalopathy Patient at baseline is independent, has no memory loss, he was delirious during his last hospitalization with UTI, and is delirious again now.   Delirium seems to be resolved  -Hold lorazepam  Parkinson's disease -Continue rotigotine patch  BPH Urethral stricture Maintain Foley -Outpatient urology follow up - Continue tadalafil  Atrial flutter Heart rate normal - Continue Eliquis and amiodarone  Diabetes with hyperglycemia Glucoses normalized - Continue Lantus - Continue sliding scale corrections  Chronic pain syndrome - Continue Cymbalta, gabapentin, tramadol  Iron deficiency anemia - Continue ferrous sulfate  Anxiety - We have tapered off of lorazepam, hopefully will not restart at discharge -Continue duloxetine, increase dose of lorazepam -Continue QHS amitriptyline,  and gabapentin  Resolved issues: AKI on CKD IIIb Cr 1.5 on admission, baseline 1.1.  Now back to baseline.  Pressure injury Stage 2 right gluteal cleft, POA           Disposition: Status is: Inpatient  Remains inpatient appropriate because:IV treatments appropriate due to intensity of illness or inability to take PO over the day it is from a resistant Klebsiella, in the setting of encephalopathy  Dispo:  Patient From: [facility]  Planned Disposition: [place]  Medically stable for discharge: No                MDM: The below labs and imaging reports reviewed and summarized above.  Medication management as above.    DVT prophylaxis:  apixaban (ELIQUIS) tablet 5 mg  Code Status: FULL Family Communication: Jason Hartman by phone            Subjective: No fever, no chest pain, no abdominal pain, no dyspnea.  His mentation is improved, he is oriented, very anxious.  He has a sore on his bottom.        Objective: Vitals:   10/14/20 0423 10/14/20 0802 10/14/20 1209 10/14/20 1547  BP: (!) 152/81 (!) 165/83 137/72 111/67  Pulse: 76 80 82 86  Resp: 17 17  18   Temp: 97.9 F (36.6 C) 97.6 F (36.4 C) 97.9 F (36.6 C) 97.6 F (36.4 C)  TempSrc: Oral Oral Oral Oral  SpO2: 99% 98% 97% 99%  Weight:      Height:        Intake/Output Summary (Last 24 hours) at 10/14/2020 1557 Last data filed  at 10/14/2020 1200 Gross per 24 hour  Intake 340 ml  Output 2325 ml  Net -1985 ml   Filed Weights   10/11/20 2145  Weight: 79.8 kg    Examination: General appearance: Elderly adult male, lying in bed, no acute distress, appears uncomfortable.  Interactive.     HEENT: Wearing eyeglasses, sclera anicteric, conjunctival lids lashes normal.  No nasal deformity, discharge, or epistaxis. Skin: Skin warm and dry, no suspicious rashes or lesions in the chest, arms, abdomen. Cardiac: Regular rate and rhythm, no murmurs, no lower extremity  edema Respiratory: Normal respiratory rate and rhythm, lungs clear without rales or wheezes Abdomen: Abdomen soft without tenderness palpation or guarding, no ascites or distention MSK:  Neuro: Awake, makes eye contact, has difficulty with the phone, but once abdominal number is able to use it.  Extraocular movements intact, moves all extremities with generalized weakness but symmetric strength, speech fluent Psych: Little bit anxious but psychomotor slowing is resolved, attention normal, affect anxious, judgment insight appear moderately impaired        Data Reviewed: I have personally reviewed following labs and imaging studies:  CBC: Recent Labs  Lab 10/11/20 2006 10/12/20 0331 10/13/20 0100 10/14/20 0101  WBC 11.3* 13.2* 12.2* 12.4*  HGB 17.4* 15.6 14.2 13.6  HCT 53.0* 48.5 43.2 41.3  MCV 86.6 86.8 86.9 87.1  PLT 251 232 243 174   Basic Metabolic Panel: Recent Labs  Lab 10/11/20 2006 10/12/20 0331 10/13/20 0100 10/14/20 0101  NA 135 139 133* 133*  K 4.7 4.4 3.8 3.9  CL 99 105 99 102  CO2 20* 22 24 24   GLUCOSE 304* 191* 154* 97  BUN 22 22 23 22   CREATININE 1.47* 1.23 1.02 0.96  CALCIUM 9.8 9.2 8.6* 8.4*  MG  --  1.7  --   --   PHOS  --  3.7  --   --    GFR: Estimated Creatinine Clearance: 56.6 mL/min (by C-G formula based on SCr of 0.96 mg/dL). Liver Function Tests: No results for input(s): AST, ALT, ALKPHOS, BILITOT, PROT, ALBUMIN in the last 168 hours. No results for input(s): LIPASE, AMYLASE in the last 168 hours. No results for input(s): AMMONIA in the last 168 hours. Coagulation Profile: No results for input(s): INR, PROTIME in the last 168 hours. Cardiac Enzymes: No results for input(s): CKTOTAL, CKMB, CKMBINDEX, TROPONINI in the last 168 hours. BNP (last 3 results) No results for input(s): PROBNP in the last 8760 hours. HbA1C: Recent Labs    10/12/20 0332  HGBA1C 7.1*   CBG: Recent Labs  Lab 10/13/20 1414 10/13/20 1705 10/13/20 2042  10/14/20 0758 10/14/20 1205  GLUCAP 176* 302* 110* 124* 174*   Lipid Profile: No results for input(s): CHOL, HDL, LDLCALC, TRIG, CHOLHDL, LDLDIRECT in the last 72 hours. Thyroid Function Tests: No results for input(s): TSH, T4TOTAL, FREET4, T3FREE, THYROIDAB in the last 72 hours. Anemia Panel: No results for input(s): VITAMINB12, FOLATE, FERRITIN, TIBC, IRON, RETICCTPCT in the last 72 hours. Urine analysis:    Component Value Date/Time   COLORURINE YELLOW 10/11/2020 2016   APPEARANCEUR CLOUDY (A) 10/11/2020 2016   LABSPEC 1.011 10/11/2020 2016   PHURINE 5.0 10/11/2020 2016   GLUCOSEU >=500 (A) 10/11/2020 2016   HGBUR LARGE (A) 10/11/2020 2016   BILIRUBINUR NEGATIVE 10/11/2020 2016   KETONESUR NEGATIVE 10/11/2020 2016   PROTEINUR 100 (A) 10/11/2020 2016   UROBILINOGEN 0.2 12/04/2013 0210   NITRITE NEGATIVE 10/11/2020 2016   LEUKOCYTESUR LARGE (A) 10/11/2020 2016  Sepsis Labs: @LABRCNTIP (procalcitonin:4,lacticacidven:4)  ) Recent Results (from the past 240 hour(s))  Urine Culture     Status: Abnormal   Collection Time: 10/11/20  8:16 PM   Specimen: Urine, Random  Result Value Ref Range Status   Specimen Description URINE, RANDOM  Final   Special Requests   Final    NONE Performed at [facility], [address] [place], [place] [address]    Culture (A)  Final    >=100,000 COLONIES/mL KLEBSIELLA PNEUMONIAE Confirmed Extended Spectrum Beta-Lactamase Producer (ESBL).  In bloodstream infections from ESBL organisms, carbapenems are preferred over piperacillin/tazobactam. They are shown to have a lower risk of mortality.    Report Status 10/14/2020 FINAL  Final   Organism ID, Bacteria KLEBSIELLA PNEUMONIAE (A)  Final      Susceptibility   Klebsiella pneumoniae - MIC*    AMPICILLIN >=32 RESISTANT Resistant     CEFAZOLIN >=64 RESISTANT Resistant     CEFEPIME >=32 RESISTANT Resistant     CEFTRIAXONE >=64 RESISTANT Resistant     CIPROFLOXACIN 2 INTERMEDIATE  Intermediate     GENTAMICIN >=16 RESISTANT Resistant     IMIPENEM <=0.25 SENSITIVE Sensitive     NITROFURANTOIN 128 RESISTANT Resistant     TRIMETH/SULFA >=320 RESISTANT Resistant     AMPICILLIN/SULBACTAM >=32 RESISTANT Resistant     PIP/TAZO 16 SENSITIVE Sensitive     * >=100,000 COLONIES/mL KLEBSIELLA PNEUMONIAE  Resp Panel by RT-PCR (Flu A&B, Covid) Nasopharyngeal Swab     Status: None   Collection Time: 10/12/20  1:55 AM   Specimen: Nasopharyngeal Swab; Nasopharyngeal(NP) swabs in vial transport medium  Result Value Ref Range Status   SARS Coronavirus 2 by RT PCR NEGATIVE NEGATIVE Final    Comment: (NOTE) SARS-CoV-2 target nucleic acids are NOT DETECTED.  The SARS-CoV-2 RNA is generally detectable in upper respiratory specimens during the acute phase of infection. The lowest concentration of SARS-CoV-2 viral copies this assay can detect is 138 copies/mL. A negative result does not preclude SARS-Cov-2 infection and should not be used as the sole basis for treatment or other patient management decisions. A negative result may occur with  improper specimen collection/handling, submission of specimen other than nasopharyngeal swab, presence of viral mutation(s) within the areas targeted by this assay, and inadequate number of viral copies(<138 copies/mL). A negative result must be combined with clinical observations, patient history, and epidemiological information. The expected result is Negative.  Fact Sheet for Patients:  EntrepreneurPulse.com.au  Fact Sheet for Healthcare Providers:  IncredibleEmployment.be  This test is no t yet approved or cleared by the [place] FDA and  has been authorized for detection and/or diagnosis of SARS-CoV-2 by FDA under an Emergency Use Authorization (EUA). This EUA will remain  in effect (meaning this test can be used) for the duration of the COVID-19 declaration under Section 564(b)(1) of the Act,  21 U.S.C.section 360bbb-3(b)(1), unless the authorization is terminated  or revoked sooner.       Influenza A by PCR NEGATIVE NEGATIVE Final   Influenza B by PCR NEGATIVE NEGATIVE Final    Comment: (NOTE) The Xpert Xpress SARS-CoV-2/FLU/RSV plus assay is intended as an aid in the diagnosis of influenza from Nasopharyngeal swab specimens and should not be used as a sole basis for treatment. Nasal washings and aspirates are unacceptable for Xpert Xpress SARS-CoV-2/FLU/RSV testing.  Fact Sheet for Patients: EntrepreneurPulse.com.au  Fact Sheet for Healthcare Providers: IncredibleEmployment.be  This test is not yet approved or cleared by the [place] and  has been authorized for detection and/or diagnosis of SARS-CoV-2 by FDA under an Emergency Use Authorization (EUA). This EUA will remain in effect (meaning this test can be used) for the duration of the COVID-19 declaration under Section 564(b)(1) of the Act, 21 U.S.C. section 360bbb-3(b)(1), unless the authorization is terminated or revoked.  Performed at [facility], [place] [address] [place], [place] [address]          Radiology Studies: No results found.      Scheduled Meds:  amiodarone  200 mg Oral Daily   apixaban  5 mg Oral BID   DULoxetine  30 mg Oral Daily   ferrous sulfate  325 mg Oral Daily   gabapentin  300 mg Oral QHS   insulin aspart  0-20 Units Subcutaneous TID WC   insulin aspart  0-5 Units Subcutaneous QHS   insulin glargine  8 Units Subcutaneous Daily   metoprolol tartrate  12.5 mg Oral BID   Rotigotine  1 patch Transdermal QPM   senna-docusate  1 tablet Oral Daily   tadalafil  5 mg Oral Daily   thiamine  100 mg Oral Daily   Continuous Infusions:  meropenem (MERREM) IV 2 g (10/14/20 1220)     LOS: 3 days    Time spent: 25  minutes    Edwin Dada, MD [facility] 10/14/2020, 3:57 PM     Please page though [place] or  Epic secure chat:  For password, contact charge nurse

## 2020-10-14 NOTE — Progress Notes (Signed)
Physical Therapy Treatment Patient Details Name: Jason Hartman MRN: [id number] DOB: 04-06-1936 Today's Date: 10/14/2020    History of Present Illness Jason Hartman is a 85 y.o. male with medical history significant for paroxysmal A. fib on Eliquis, OSA, type 2 diabetes, chronic anxiety/depression, coronary artery disease, hypertension, hyperlipidemia, bladder cancer, BPH, chronic indwelling Foley catheter, hypogonadism, Parkinson disease, restless leg syndrome, chronic low back pain without sciatica, diabetic polyneuropathy, insomnia, who presented to [facility] ED from SNF due to confusion and possible unwitnessed fall.  Patient was found on the ground by staff. Found to have UTI.    PT Comments    Pt required +2 max assist bed mobility and S/minG sitting balance EOB. Pt declining standing/OOB attempts. Only agreeable to sitting x 3 minutes. Positioned in R sidelying upon return to bed. Pain is limiting mobility but pt also demonstrating self limiting behavior. PT to continue per POC. Pt needs max encouragement to mobilize.     Follow Up Recommendations  SNF     Equipment Recommendations  None recommended by PT    Recommendations for Other Services       Precautions / Restrictions Precautions Precautions: Fall Restrictions Weight Bearing Restrictions: No    Mobility  Bed Mobility Overal bed mobility: Needs Assistance Bed Mobility: Supine to Sit;Sit to Supine Rolling: Total assist;+2 for physical assistance;+2 for safety/equipment   Supine to sit: +2 for physical assistance;Max assist Sit to supine: +2 for physical assistance;Max assist   General bed mobility comments: +rail, increased time, cues for sequencing    Transfers Overall transfer level: Needs assistance Equipment used: 2 person hand held assist Transfers: Sit to/from Stand;Stand Pivot Transfers Sit to Stand: Max assist;+2 physical assistance;+2 safety/equipment Stand pivot transfers: Max assist;+2 safety/equipment;+2  physical assistance       General transfer comment: Pt unwilling to attempt standing/OOB  Ambulation/Gait                 Stairs             Wheelchair Mobility    Modified Rankin (Stroke Patients Only)       Balance Overall balance assessment: Needs assistance Sitting-balance support: Feet supported;Bilateral upper extremity supported Sitting balance-Leahy Scale: Fair Sitting balance - Comments: Static sit EOB x 3 minutes supervision/minG Postural control: Posterior lean Standing balance support: Bilateral upper extremity supported Standing balance-Leahy Scale: Poor Standing balance comment: Pt requires heavy support from therapist and NT                            Cognition Arousal/Alertness: Awake/alert Behavior During Therapy: Anxious Overall Cognitive Status: No family/caregiver present to determine baseline cognitive functioning                                        Exercises      General Comments General comments (skin integrity, edema, etc.): VSS on RA      Pertinent Vitals/Pain Pain Assessment: Faces Faces Pain Scale: Hurts even more Pain Location: L shoulder and back Pain Descriptors / Indicators: Aching;Discomfort;Sore;Guarding;Grimacing;Moaning Pain Intervention(s): Limited activity within patient's tolerance;Monitored during session;Repositioned    Home Living Family/patient expects to be discharged to:: Skilled nursing facility                    Prior Function Level of Independence: Needs assistance  Gait / Transfers Assistance Needed:  patient reports at SNF he is able to take a few steps to chair with assistance. ADL's / Homemaking Assistance Needed: Pt needs mod-max A for all ADL's     PT Goals (current goals can now be found in the care plan section) Acute Rehab PT Goals Patient Stated Goal: get better Progress towards PT goals: Progressing toward goals    Frequency    Min  2X/week      PT Plan Current plan remains appropriate    Co-evaluation              AM-PAC PT "6 Clicks" Mobility   Outcome Measure  Help needed turning from your back to your side while in a flat bed without using bedrails?: A Lot Help needed moving from lying on your back to sitting on the side of a flat bed without using bedrails?: A Lot Help needed moving to and from a bed to a chair (including a wheelchair)?: Total Help needed standing up from a chair using your arms (e.g., wheelchair or bedside chair)?: Total Help needed to walk in hospital room?: Total Help needed climbing 3-5 steps with a railing? : Total 6 Click Score: 8    End of Session   Activity Tolerance: Patient limited by pain;Other (comment) (self limiting) Patient left: in bed;with call bell/phone within reach;with bed alarm set Nurse Communication: Mobility status PT Visit Diagnosis: Muscle weakness (generalized) (M62.81);Other abnormalities of gait and mobility (R26.89);Pain     Time: 3810-1751 PT Time Calculation (min) (ACUTE ONLY): 12 min  Charges:  $Therapeutic Activity: 8-22 mins                     Lorrin Goodell, PT  Office # [phone] Pager [phone]    Lorriane Shire 10/14/2020, 1:01 PM

## 2020-10-15 ENCOUNTER — Inpatient Hospital Stay: Payer: Self-pay

## 2020-10-15 LAB — CBC
HCT: 43.5 % (ref 39.0–52.0)
Hemoglobin: 13.8 g/dL (ref 13.0–17.0)
MCH: 27.8 pg (ref 26.0–34.0)
MCHC: 31.7 g/dL (ref 30.0–36.0)
MCV: 87.5 fL (ref 80.0–100.0)
Platelets: 177 10*3/uL (ref 150–400)
RBC: 4.97 MIL/uL (ref 4.22–5.81)
RDW: 14.4 % (ref 11.5–15.5)
WBC: 8.6 10*3/uL (ref 4.0–10.5)
nRBC: 0 % (ref 0.0–0.2)

## 2020-10-15 LAB — GLUCOSE, CAPILLARY
Glucose-Capillary: 180 mg/dL — ABNORMAL HIGH (ref 70–99)
Glucose-Capillary: 185 mg/dL — ABNORMAL HIGH (ref 70–99)
Glucose-Capillary: 140 mg/dL — ABNORMAL HIGH (ref 70–99)
Glucose-Capillary: 111 mg/dL — ABNORMAL HIGH (ref 70–99)

## 2020-10-15 LAB — BASIC METABOLIC PANEL
Anion gap: 6 (ref 5–15)
BUN: 17 mg/dL (ref 8–23)
CO2: 27 mmol/L (ref 22–32)
Calcium: 8.7 mg/dL — ABNORMAL LOW (ref 8.9–10.3)
Chloride: 100 mmol/L (ref 98–111)
Creatinine, Ser: 1.01 mg/dL (ref 0.61–1.24)
GFR, Estimated: >60 mL/min (ref >60)
Glucose, Bld: 206 mg/dL — ABNORMAL HIGH (ref 70–99)
Potassium: 4 mmol/L (ref 3.5–5.1)
Sodium: 133 mmol/L — ABNORMAL LOW (ref 135–145)

## 2020-10-15 MED ORDER — BISACODYL 10 MG RE SUPP: 10.0000 mg | Freq: Once | RECTAL | Status: DC

## 2020-10-15 MED ORDER — POLYETHYLENE GLYCOL 3350 17 G PO PACK: 17.0000 g | PACK | Freq: Every day | ORAL | Status: DC

## 2020-10-15 NOTE — Consult Note (Signed)
[facility] Nurse wound follow up Wound type:Reconsulted as patient is experiencing discomfort when silicone foam is peeled back for assessment.  A nonadherent wound contact layer is desired. Measurement:Per Nursing flow sheet, affected area measures 5cm x 2cm x 0.1cm Wound OUZ:HQUI, moist Drainage (amount, consistency, odor) scant serous Periwound:intact, macerated Dressing procedure/placement/frequency: I have provided Nursing with guidance for use of a nonadherent antimicrobial wound contact layer (xeroform) beneath the silicone foam. Turning from side to side will continue.  I have also provided a pressure redistribution chair cushion for use in the post acute care setting and Prevalon Boots to wear at night to prevent a heel pressure injury.  [facility] nursing team will not follow, but will remain available to this patient, the nursing and medical teams.  Please re-consult if needed. Thanks, Maudie Flakes, MSN, RN, [place], [facility]  Pager# [phone]

## 2020-10-15 NOTE — Progress Notes (Signed)
PICC ordered for OPAT merrem x 4-5 more days.   Dr Loleta Books and Paulette RN notified probable placement Monday AM.  DC plans for Monday to SNF.

## 2020-10-15 NOTE — Plan of Care (Signed)

## 2020-10-15 NOTE — Progress Notes (Signed)
PROGRESS NOTE    Jason Hartman  MEQ:[id number] DOB: Sep 01, 1935 DOA: 10/11/2020 PCP: [name], MD      Brief Narrative:  Jason Hartman is a 85 y.o. M with Parkinson's on Rotigotine, bad RLS, anxiety, pAF not on AC, OSA, DM, CKD IIIb, and chronic pain as well as urethral stricutre who presented with confusion.  Admitted 6/3 for one week for UTI with sepsis and delirium.  Had new pAF during that hospital stay, started on amio, had bleeding complications from penis so AC held.   Urology consulted, dilated urethra and placed foley.  Discharged with indwelling foley.  Treated for enterecoccus with Unasyn.  Since then, has been in rehab.  [facility] numerous times.  Finally, fell again today, was noted in the ER to be tachycardic, and persistently very confused.        Assessment & Plan:  Severe sepsis due to UTI See progress note from 7/9 - Continue Merrem, day 2 - Place PICC for OPAT     Acute metabolic encephalopathy Patient at baseline is independent, has no memory loss, he was delirious during his last hospitalization with UTI, and is delirious again now.   Delirium mostly resolved, oriented to person and situation -Hold lorazepam  Parkinson's disease -Continue rotigotine patch  BPH Urethral stricture -Consult urology to replace Foley -Outpatient urology follow up - Continue tadalafil  Atrial flutter Heart rate normal - Continue Eliquis and amiodarone   Diabetes with hyperglycemia Glucoses controlled - Continue Lantus and sliding scale corrections  Chronic pain syndrome - Continue Cymbalta, gabapentin, tramadol  Iron deficiency anemia - Continue ferrous sulfate  Anxiety - We have tapered off of lorazepam, hopefully will not restart at discharge -Continue duloxetine, increase dose given that we are tapering off lorazepam -Continue QHS amitriptyline, and gabapentin  Resolved issues: AKI on CKD IIIb Cr 1.5 on admission, baseline 1.1.  Now back to  baseline.  Pressure injury Stage 2 right gluteal cleft, POA           Disposition: Status is: Inpatient  Remains inpatient appropriate because:IV treatments appropriate due to intensity of illness or inability to take PO over the day it is from a resistant Klebsiella, in the setting of encephalopathy  Dispo:  Patient From: [facility]  Planned Disposition: [place]  Medically stable for discharge: No                MDM: The below labs and imaging reports reviewed and summarized above.  Medication management as above.    DVT prophylaxis:  apixaban (ELIQUIS) tablet 5 mg  Code Status: FULL Family Communication: Wife by phone            Subjective: No new fever, abdominal pain, chest pain, dyspnea.  Oriented but very anxious.  He will need a new PICC, and exchange of his Foley and then he will be ready for discharge.        Objective: Vitals:   10/14/20 1948 10/14/20 2319 10/15/20 0459 10/15/20 0819  BP: 138/71 122/72 135/70 (!) 155/71  Pulse: 90 79 64 71  Resp:  18 19 18   Temp: 98 F (36.7 C) (!) 97.3 F (36.3 C) 97.9 F (36.6 C) (!) 97.4 F (36.3 C)  TempSrc: Oral  Oral Axillary  SpO2: 98% 97% 98% 99%  Weight:      Height:        Intake/Output Summary (Last 24 hours) at 10/15/2020 1616 Last data filed at 10/15/2020 1230 Gross per 24 hour  Intake 1440 ml  Output 1300 ml  Net 140 ml   Filed Weights   10/11/20 2145  Weight: 79.8 kg    Examination: General appearance: Elderly adult male, lying in bed, no acute distress, unable to feed himself.  Interactive.     HEENT: Wearing eyeglasses, sclera anicteric, conjunctival, lids and lashes normal, no nasal deformity, discharge, or epistaxis. Skin: Skin warm and dry, no suspicious rashes or lesions. Cardiac: RRR, no murmurs, no lower extremity edema Respiratory: Normal respiratory rate and rhythm, lungs clear without rales or wheezes Abdomen: Abdomen soft  without tenderness palpation or guarding, no ascites or distention MSK:  Neuro: Awake and alert, extraocular movements intact, moves all extremities with generalized weakness but symmetric strength.  Speech fluent. Psych: Anxious, psychomotor slowing resolved, attention normal, seems overall weak, judgment insight appear moderately impaired       Data Reviewed: I have personally reviewed following labs and imaging studies:  CBC: Recent Labs  Lab 10/11/20 2006 10/12/20 0331 10/13/20 0100 10/14/20 0101 10/15/20 0859  WBC 11.3* 13.2* 12.2* 12.4* 8.6  HGB 17.4* 15.6 14.2 13.6 13.8  HCT 53.0* 48.5 43.2 41.3 43.5  MCV 86.6 86.8 86.9 87.1 87.5  PLT 251 232 243 190 268   Basic Metabolic Panel: Recent Labs  Lab 10/11/20 2006 10/12/20 0331 10/13/20 0100 10/14/20 0101 10/15/20 0859  NA 135 139 133* 133* 133*  K 4.7 4.4 3.8 3.9 4.0  CL 99 105 99 102 100  CO2 20* 22 24 24 27   GLUCOSE 304* 191* 154* 97 206*  BUN 22 22 23 22 17   CREATININE 1.47* 1.23 1.02 0.96 1.01  CALCIUM 9.8 9.2 8.6* 8.4* 8.7*  MG  --  1.7  --   --   --   PHOS  --  3.7  --   --   --    GFR: Estimated Creatinine Clearance: 53.8 mL/min (by C-G formula based on SCr of 1.01 mg/dL). Liver Function Tests: No results for input(s): AST, ALT, ALKPHOS, BILITOT, PROT, ALBUMIN in the last 168 hours. No results for input(s): LIPASE, AMYLASE in the last 168 hours. No results for input(s): AMMONIA in the last 168 hours. Coagulation Profile: No results for input(s): INR, PROTIME in the last 168 hours. Cardiac Enzymes: No results for input(s): CKTOTAL, CKMB, CKMBINDEX, TROPONINI in the last 168 hours. BNP (last 3 results) No results for input(s): PROBNP in the last 8760 hours. HbA1C: No results for input(s): HGBA1C in the last 72 hours.  CBG: Recent Labs  Lab 10/14/20 1205 10/14/20 1725 10/14/20 2035 10/15/20 0816 10/15/20 1211  GLUCAP 174* 145* 170* 180* 185*   Lipid Profile: No results for input(s): CHOL,  HDL, LDLCALC, TRIG, CHOLHDL, LDLDIRECT in the last 72 hours. Thyroid Function Tests: No results for input(s): TSH, T4TOTAL, FREET4, T3FREE, THYROIDAB in the last 72 hours. Anemia Panel: No results for input(s): VITAMINB12, FOLATE, FERRITIN, TIBC, IRON, RETICCTPCT in the last 72 hours. Urine analysis:    Component Value Date/Time   COLORURINE YELLOW 10/11/2020 2016   APPEARANCEUR CLOUDY (A) 10/11/2020 2016   LABSPEC 1.011 10/11/2020 2016   PHURINE 5.0 10/11/2020 2016   GLUCOSEU >=500 (A) 10/11/2020 2016   HGBUR LARGE (A) 10/11/2020 2016   BILIRUBINUR NEGATIVE 10/11/2020 2016   KETONESUR NEGATIVE 10/11/2020 2016   PROTEINUR 100 (A) 10/11/2020 2016   UROBILINOGEN 0.2 12/04/2013 0210   NITRITE NEGATIVE 10/11/2020 2016   LEUKOCYTESUR LARGE (A) 10/11/2020 2016   Sepsis Labs: @LABRCNTIP (procalcitonin:4,lacticacidven:4)  ) Recent Results (from the past 240  hour(s))  Urine Culture     Status: Abnormal   Collection Time: 10/11/20  8:16 PM   Specimen: Urine, Random  Result Value Ref Range Status   Specimen Description URINE, RANDOM  Final   Special Requests   Final    NONE Performed at [facility], [address], [place], [place] [address]    Culture (A)  Final    >=100,000 COLONIES/mL KLEBSIELLA PNEUMONIAE Confirmed Extended Spectrum Beta-Lactamase Producer (ESBL).  In bloodstream infections from ESBL organisms, carbapenems are preferred over piperacillin/tazobactam. They are shown to have a lower risk of mortality.    Report Status 10/14/2020 FINAL  Final   Organism ID, Bacteria KLEBSIELLA PNEUMONIAE (A)  Final      Susceptibility   Klebsiella pneumoniae - MIC*    AMPICILLIN >=32 RESISTANT Resistant     CEFAZOLIN >=64 RESISTANT Resistant     CEFEPIME >=32 RESISTANT Resistant     CEFTRIAXONE >=64 RESISTANT Resistant     CIPROFLOXACIN 2 INTERMEDIATE Intermediate     GENTAMICIN >=16 RESISTANT Resistant     IMIPENEM <=0.25 SENSITIVE Sensitive     NITROFURANTOIN 128  RESISTANT Resistant     TRIMETH/SULFA >=320 RESISTANT Resistant     AMPICILLIN/SULBACTAM >=32 RESISTANT Resistant     PIP/TAZO 16 SENSITIVE Sensitive     * >=100,000 COLONIES/mL KLEBSIELLA PNEUMONIAE  Resp Panel by RT-PCR (Flu A&B, Covid) Nasopharyngeal Swab     Status: None   Collection Time: 10/12/20  1:55 AM   Specimen: Nasopharyngeal Swab; Nasopharyngeal(NP) swabs in vial transport medium  Result Value Ref Range Status   SARS Coronavirus 2 by RT PCR NEGATIVE NEGATIVE Final    Comment: (NOTE) SARS-CoV-2 target nucleic acids are NOT DETECTED.  The SARS-CoV-2 RNA is generally detectable in upper respiratory specimens during the acute phase of infection. The lowest concentration of SARS-CoV-2 viral copies this assay can detect is 138 copies/mL. A negative result does not preclude SARS-Cov-2 infection and should not be used as the sole basis for treatment or other patient management decisions. A negative result may occur with  improper specimen collection/handling, submission of specimen other than nasopharyngeal swab, presence of viral mutation(s) within the areas targeted by this assay, and inadequate number of viral copies(<138 copies/mL). A negative result must be combined with clinical observations, patient history, and epidemiological information. The expected result is Negative.  Fact Sheet for Patients:  EntrepreneurPulse.com.au  Fact Sheet for Healthcare Providers:  IncredibleEmployment.be  This test is no t yet approved or cleared by the [place] FDA and  has been authorized for detection and/or diagnosis of SARS-CoV-2 by FDA under an Emergency Use Authorization (EUA). This EUA will remain  in effect (meaning this test can be used) for the duration of the COVID-19 declaration under Section 564(b)(1) of the Act, 21 U.S.C.section 360bbb-3(b)(1), unless the authorization is terminated  or revoked sooner.       Influenza A by PCR  NEGATIVE NEGATIVE Final   Influenza B by PCR NEGATIVE NEGATIVE Final    Comment: (NOTE) The Xpert Xpress SARS-CoV-2/FLU/RSV plus assay is intended as an aid in the diagnosis of influenza from Nasopharyngeal swab specimens and should not be used as a sole basis for treatment. Nasal washings and aspirates are unacceptable for Xpert Xpress SARS-CoV-2/FLU/RSV testing.  Fact Sheet for Patients: EntrepreneurPulse.com.au  Fact Sheet for Healthcare Providers: IncredibleEmployment.be  This test is not yet approved or cleared by the [place] FDA and has been authorized for detection and/or diagnosis of SARS-CoV-2 by FDA  under an Emergency Use Authorization (EUA). This EUA will remain in effect (meaning this test can be used) for the duration of the COVID-19 declaration under Section 564(b)(1) of the Act, 21 U.S.C. section 360bbb-3(b)(1), unless the authorization is terminated or revoked.  Performed at [facility], [place] [address], [place], [place] [address]          Radiology Studies: [place] EKG SITE RITE  Result Date: 10/15/2020 If Site Rite image not attached, placement could not be confirmed due to current cardiac rhythm.       Scheduled Meds:  amiodarone  200 mg Oral Daily   amitriptyline  10 mg Oral QHS   apixaban  5 mg Oral BID   bisacodyl  10 mg Rectal Once   DULoxetine  40 mg Oral Daily   ferrous sulfate  325 mg Oral Daily   gabapentin  300 mg Oral QHS   insulin aspart  0-20 Units Subcutaneous TID WC   insulin aspart  0-5 Units Subcutaneous QHS   insulin glargine  8 Units Subcutaneous Daily   metoprolol tartrate  12.5 mg Oral BID   [START ON 10/16/2020] polyethylene glycol  17 g Oral Daily   Rotigotine  1 patch Transdermal QPM   senna-docusate  1 tablet Oral Daily   tadalafil  5 mg Oral Daily   thiamine  100 mg Oral Daily   Continuous Infusions:  meropenem (MERREM) IV 2 g (10/15/20 1546)     LOS: 4 days    Time  spent: 25  minutes    Edwin Dada, MD [facility] 10/15/2020, 4:16 PM     Please page though [place] or Epic secure chat:  For password, contact charge nurse

## 2020-10-15 NOTE — Progress Notes (Signed)
Pt refuses cpap at this time.  RT will monitor.

## 2020-10-16 DIAGNOSIS — F411 Generalized anxiety disorder: Secondary | ICD-10-CM | POA: Diagnosis not present

## 2020-10-16 DIAGNOSIS — I484 Atypical atrial flutter: Secondary | ICD-10-CM | POA: Diagnosis not present

## 2020-10-16 DIAGNOSIS — G894 Chronic pain syndrome: Secondary | ICD-10-CM | POA: Diagnosis not present

## 2020-10-16 DIAGNOSIS — R6520 Severe sepsis without septic shock: Secondary | ICD-10-CM | POA: Diagnosis not present

## 2020-10-16 DIAGNOSIS — A4151 Sepsis due to Escherichia coli [E. coli]: Secondary | ICD-10-CM | POA: Diagnosis not present

## 2020-10-16 DIAGNOSIS — I2510 Atherosclerotic heart disease of native coronary artery without angina pectoris: Secondary | ICD-10-CM | POA: Diagnosis not present

## 2020-10-16 DIAGNOSIS — Z7409 Other reduced mobility: Secondary | ICD-10-CM | POA: Diagnosis not present

## 2020-10-16 DIAGNOSIS — A419 Sepsis, unspecified organism: Secondary | ICD-10-CM | POA: Diagnosis not present

## 2020-10-16 DIAGNOSIS — L89892 Pressure ulcer of other site, stage 2: Secondary | ICD-10-CM | POA: Diagnosis not present

## 2020-10-16 DIAGNOSIS — G20 Parkinson's disease: Secondary | ICD-10-CM | POA: Diagnosis not present

## 2020-10-16 DIAGNOSIS — G4733 Obstructive sleep apnea (adult) (pediatric): Secondary | ICD-10-CM | POA: Diagnosis not present

## 2020-10-16 DIAGNOSIS — Z794 Long term (current) use of insulin: Secondary | ICD-10-CM | POA: Diagnosis not present

## 2020-10-16 DIAGNOSIS — E782 Mixed hyperlipidemia: Secondary | ICD-10-CM | POA: Diagnosis not present

## 2020-10-16 DIAGNOSIS — R197 Diarrhea, unspecified: Secondary | ICD-10-CM | POA: Diagnosis not present

## 2020-10-16 DIAGNOSIS — D508 Other iron deficiency anemias: Secondary | ICD-10-CM | POA: Diagnosis not present

## 2020-10-16 DIAGNOSIS — M1990 Unspecified osteoarthritis, unspecified site: Secondary | ICD-10-CM | POA: Diagnosis not present

## 2020-10-16 DIAGNOSIS — R319 Hematuria, unspecified: Secondary | ICD-10-CM | POA: Diagnosis not present

## 2020-10-16 DIAGNOSIS — G9340 Encephalopathy, unspecified: Secondary | ICD-10-CM | POA: Diagnosis not present

## 2020-10-16 DIAGNOSIS — Z7984 Long term (current) use of oral hypoglycemic drugs: Secondary | ICD-10-CM | POA: Diagnosis not present

## 2020-10-16 DIAGNOSIS — Z7401 Bed confinement status: Secondary | ICD-10-CM | POA: Diagnosis not present

## 2020-10-16 DIAGNOSIS — R21 Rash and other nonspecific skin eruption: Secondary | ICD-10-CM | POA: Diagnosis not present

## 2020-10-16 DIAGNOSIS — I480 Paroxysmal atrial fibrillation: Secondary | ICD-10-CM | POA: Diagnosis not present

## 2020-10-16 DIAGNOSIS — E1122 Type 2 diabetes mellitus with diabetic chronic kidney disease: Secondary | ICD-10-CM | POA: Diagnosis not present

## 2020-10-16 DIAGNOSIS — M6281 Muscle weakness (generalized): Secondary | ICD-10-CM | POA: Diagnosis not present

## 2020-10-16 DIAGNOSIS — N402 Nodular prostate without lower urinary tract symptoms: Secondary | ICD-10-CM | POA: Diagnosis not present

## 2020-10-16 DIAGNOSIS — R404 Transient alteration of awareness: Secondary | ICD-10-CM | POA: Diagnosis not present

## 2020-10-16 DIAGNOSIS — R278 Other lack of coordination: Secondary | ICD-10-CM | POA: Diagnosis not present

## 2020-10-16 DIAGNOSIS — R5381 Other malaise: Secondary | ICD-10-CM | POA: Diagnosis not present

## 2020-10-16 DIAGNOSIS — N390 Urinary tract infection, site not specified: Secondary | ICD-10-CM | POA: Diagnosis not present

## 2020-10-16 DIAGNOSIS — R410 Disorientation, unspecified: Secondary | ICD-10-CM | POA: Diagnosis not present

## 2020-10-16 DIAGNOSIS — R2681 Unsteadiness on feet: Secondary | ICD-10-CM | POA: Diagnosis not present

## 2020-10-16 DIAGNOSIS — R41841 Cognitive communication deficit: Secondary | ICD-10-CM | POA: Diagnosis not present

## 2020-10-16 DIAGNOSIS — I10 Essential (primary) hypertension: Secondary | ICD-10-CM | POA: Diagnosis not present

## 2020-10-16 DIAGNOSIS — R1312 Dysphagia, oropharyngeal phase: Secondary | ICD-10-CM | POA: Diagnosis not present

## 2020-10-16 LAB — GLUCOSE, CAPILLARY
Glucose-Capillary: 247 mg/dL — ABNORMAL HIGH (ref 70–99)
Glucose-Capillary: 166 mg/dL — ABNORMAL HIGH (ref 70–99)
Glucose-Capillary: 175 mg/dL — ABNORMAL HIGH (ref 70–99)

## 2020-10-16 LAB — RESP PANEL BY RT-PCR (FLU A&B, COVID) ARPGX2
Influenza A by PCR: NEGATIVE
Influenza B by PCR: NEGATIVE
SARS Coronavirus 2 by RT PCR: NEGATIVE

## 2020-10-16 MED ORDER — SODIUM CHLORIDE 0.9% FLUSH: 10.0000 mL | INTRAVENOUS | Status: DC | PRN

## 2020-10-16 MED ORDER — ERTAPENEM IV (FOR PTA / DISCHARGE USE ONLY): 1.0000 g | INTRAVENOUS | 0 refills | Status: DC

## 2020-10-16 MED ORDER — TRAMADOL HCL 50 MG PO TABS: 50.0000 mg | ORAL_TABLET | Freq: Every day | ORAL | 0 refills | Status: DC

## 2020-10-16 MED ORDER — SODIUM CHLORIDE 0.9 % IV SOLN
1.0000 g | Freq: Three times a day (TID) | INTRAVENOUS | Status: DC
  Administered 2020-10-16: 1 g via INTRAVENOUS
  Filled 2020-10-16 (×4): qty 1

## 2020-10-16 MED ORDER — GERHARDT'S BUTT CREAM
TOPICAL_CREAM | Freq: Four times a day (QID) | CUTANEOUS | Status: DC
  Filled 2020-10-16: qty 1

## 2020-10-16 MED ORDER — SODIUM CHLORIDE 0.9% FLUSH: 10.0000 mL | Freq: Two times a day (BID) | INTRAVENOUS | Status: DC

## 2020-10-16 NOTE — Progress Notes (Signed)
Spoke to wife of patient Jason Hartman ;refused to give consent  for PICC insertion and want to speak to MD regarding treatment of her husband.

## 2020-10-16 NOTE — Progress Notes (Signed)
[name] to be D/C'd  per MD order.  Discussed with the patient and spouse, all questions fully answered.  VSS, Skin clean, dry and intact without evidence of skin break down, no evidence of skin tears noted.  IV catheter discontinued intact. Site without signs and symptoms of complications. Dressing and pressure applied.  An After Visit Summary was printed and given to the [place]. Patient received home medication, Rotigotine patches, from pharmacy. Given to PTAR.  D/c education completed with patient/family including follow up instructions, medication list, d/c activities limitations if indicated, with other d/c instructions as indicated by MD - patient able to verbalize understanding, all questions fully answered.   Patient escorted via PTAR and D/C to [facility]. Report given to Amy, RN by prior nurse.

## 2020-10-16 NOTE — Progress Notes (Addendum)
PHARMACY CONSULT NOTE FOR:  OUTPATIENT  PARENTERAL ANTIBIOTIC THERAPY (OPAT)  Indication: ESBL Kleb pneumo UTI Regimen: Invanz 1gm IV Q24H End date: 10/20/20  IV antibiotic discharge orders are pended. To discharging provider:  please sign these orders via discharge navigator,  Select New Orders & click on the button choice - Manage This Unsigned Work.     Thank you for allowing pharmacy to be a part of this patient's care.  [place] D. Mina Marble, PharmD, BCPS, [place] 10/16/2020, 2:43 PM

## 2020-10-16 NOTE — Discharge Summary (Signed)
Physician Discharge Summary  Jason Hartman SUP:[id number] DOB: 11/22/1935 DOA: 10/11/2020  PCP: [name], MD  Admit date: 10/11/2020 Discharge date: 10/16/2020  Admitted From: SNF  Disposition:  SNF [facility]   Recommendations for Outpatient Follow-up:  Please schedule with Urology in 1 week Continue Ertapenem for 5 more days, starting Monday evening 7/11 until Friday Jul 15 Check CBC and BMP in 1 week       Home Health: N/A  Equipment/Devices: TBD at SNF  Discharge Condition: Fair  CODE STATUS: FULL Diet recommendation: Diabetic  Brief/Interim Summary: Mr. Jason Hartman is a 85 y.o. M with Parkinson's on Rotigotine, bad RLS, anxiety, pAF not on AC, OSA, DM, CKD IIIb, and chronic pain as well as urethral stricutre who presented with confusion.   Admitted 6/3 for one week for UTI with sepsis and delirium.  Had new pAF during that hospital stay, started on amio, had bleeding complications from penis so AC held.   Urology consulted, dilated urethra and placed foley.  Discharged with indwelling foley.  Treated for enterecoccus with Unasyn.   Since then, has been in rehab.  [facility] numerous times.  Finally, fell again today, was noted in the ER to be tachycardic, and persistently very confused.     PRINCIPAL HOSPITAL DIAGNOSIS: Sepsis due to ESBL Klebsiella UTI associated with catheter    Discharge Diagnoses:  Severe sepsis due to UTI The patient presented with fever, leukocytosis, sinus tachycardia, and confusion in the setting of UTI due to indwelling Foley.  Present on admission.   His foley had been recommended to be taken out in 7 days after last discharge, and patient Urology follow up was neglected, foley was left in place for 1 month and he returned with new UTI.  Here, culture of urine grew Klebsiella pneumoniae, ESBL.  Blood cultures negative.  He was placed on meropenem and the foley was removed.  He should complete 7 days antibiotics on 7/15 and he should follow up with  Urology.          Acute metabolic encephalopathy Patient at baseline is independent, has no memory loss, he was delirious during his last hospitalization with UTI, and was delirious and disoriented again on hospitalization here.  This resolved with antibiotic treatment.  Ativan was held and the patient had no withdrawals.  Given his tendency for delirium, lorazepam and other benzodiazepines are relatively contraindicated.  His duloxetine was titrated up     Parkinson's disease   BPH Urethral stricture Urology follow up after discharge.  Continue tadalafil.   Atrial flutter Heart rate normal.  On Eliquis and amiodarone.    Diabetes with hyperglycemia  Glucoses improved by discharge  Chronic pain syndrome   Iron deficiency anemia   Anxiety He was tapered off lorazepam successfully here.  His duloxetine was increased.    AKI on CKD IIIb Cr 1.5 on admission, baseline 1.1.  Now back to baseline.   Pressure injury Stage 2 right gluteal cleft, POA                  Discharge Instructions  Discharge Instructions     [facility] pharmacist to adjust dose for Vancomycin, Aminoglycosides and other anti-infective therapies as requested by physician.   Complete by: As directed    [facility] to provide Cath Flo 74m   Complete by: As directed    Administer for PICC line occlusion and as ordered by physician for other access device issues.   Anaphylaxis Kit: Provided to  treat any anaphylactic reaction to the medication being provided to the patient if First Dose or when requested by physician   Complete by: As directed    Epinephrine 59m/ml vial / amp: Administer 0.336m(0.54m61msubcutaneously once for moderate to severe anaphylaxis, nurse to call physician and pharmacy when reaction occurs and call 911 if needed for immediate care   Diphenhydramine 53m854m IV vial: Administer 25-53mg76mIM PRN for first dose reaction, rash, itching, mild reaction, nurse  to call physician and pharmacy when reaction occurs   Sodium Chloride 0.9% NS 500ml 66mAdminister if needed for hypovolemic blood pressure drop or as ordered by physician after call to physician with anaphylactic reaction   Change dressing on IV access line weekly and PRN   Complete by: As directed    Diet - low sodium heart healthy   Complete by: As directed    Discharge wound care:   Complete by: As directed    For buttock pressure wound:  Cleanse with soap and water, pat dry Cover with Xeroform gauze then top with dry gauze 4x4 Top with silicone foam Replace xeroform twice daily, may reuse silicone foam for 3 days Change PRN for soiling   Flush IV access with Sodium Chloride 0.9% and Heparin 10 units/ml or 100 units/ml   Complete by: As directed    Home infusion instructions - [facility]   Complete by: As directed    Instructions: Flush IV access with Sodium Chloride 0.9% and Heparin 10units/ml or 100units/ml   Change dressing on IV access line: Weekly and PRN   Instructions Cath Flo 2mg: A44mnister for PICC Line occlusion and as ordered by physician for other access device   [facility] pharmacist to adjust dose for: Vancomycin, Aminoglycosides and other anti-infective therapies as requested by physician   Increase activity slowly   Complete by: As directed    Method of administration may be changed at the discretion of home infusion pharmacist based upon assessment of the patient and/or caregiver's ability to self-administer the medication ordered   Complete by: As directed       Allergies as of 10/16/2020       Reactions   Oxycodone Anaphylaxis   Other reaction(s): Other (See Comments) Cannot recall specifics   Iodinated Diagnostic Agents Hives   alleric to renografin,isovue & omnipaque, hives, requires 13 hr prep//a.calhoun, Onset Date: [id number] Hives, Rash   alleric to renografin,isovue & omnipaque, hives, requires 13 hr prep//a.calhoun,  Onset Date: [id number] to renografin,isovue & omnipaque, hives, requires 13 hr prep//a.calhoun, Onset Date: [id number] Hives, Rash      Methadone Hcl Other (See Comments)   HALLUCINATIONS   Promethazine Other (See Comments)   Mental status change, DELIRIUM Other reaction(s): erratic behavior Other reaction(s): Other (See Comments) Mental status change DELIRIUM (pulled out IV)   Isovue [iopamidol]    Morphine Sulfate Other (See Comments)   Pt not sure about allergy    Omnipaque [iohexol] Hives   Code: HIVES, Desc: alleric to renografin,isovue & omnipaque, hives, requires 13 hr prep//a.calhoun, Onset Date: [id number] Hives   Other reaction(s): erratic behanvior Other reaction(s): erratic behavior   Oxycodone Hcl Other (See Comments)   Pt not sure about allergy    Quetiapine Other (See Comments)   Pt not sure about allergy  Other reaction(s): Other (See Comments) Pt not sure about allergy  Pt not sure about allergy    Renografin [diatrizoate]    Statins  Other reaction(s): muscle weakness Other reaction(s): muscle weakness   Zolpidem Other (See Comments)   Other reaction(s): erratic behanvior Other reaction(s): Delusions (intolerance) Altered mental status   Atorvastatin Itching, Other (See Comments)   Tired, weakness   Carbidopa-levodopa Anxiety   Chlorhexidine Gluconate [chlorhexidine] Hives, Rash   Clindamycin Rash   Colesevelam Other (See Comments)   tired   Doxazosin Rash   Lovastatin Other (See Comments)   Tired, nervousness   Rosuvastatin Other (See Comments)   Tired, weakness        Medication List     STOP taking these medications    allopurinol 100 MG tablet Commonly known as: Zyloprim   hydrocortisone 2.5 % cream   LORazepam 0.5 MG tablet Commonly known as: ATIVAN   ofloxacin 0.3 % ophthalmic solution Commonly known as: OCUFLOX   predniSONE 20 MG tablet Commonly known as: DELTASONE       TAKE these medications     acetaminophen 500 MG tablet Commonly known as: TYLENOL Take 500 mg by mouth 3 (three) times daily.   amiodarone 200 MG tablet Commonly known as: PACERONE Take 1 tablet (200 mg total) by mouth daily.   amitriptyline 10 MG tablet Commonly known as: ELAVIL Take 10 mg by mouth at bedtime.   apixaban 5 MG Tabs tablet Commonly known as: ELIQUIS Take 1 tablet (5 mg total) by mouth 2 (two) times daily.   BD Pen Needle Nano 2nd Gen 32G X 4 MM Misc Generic drug: Insulin Pen Needle FOR LANTUS AND VICTOZA PENS   DULoxetine 30 MG capsule Commonly known as: CYMBALTA Take 30 mg by mouth daily.   Ensure Take 237 mLs by mouth in the morning and at bedtime.   ertapenem  IVPB Commonly known as: INVANZ Inject 1 g into the vein daily. Indication:  ESBL Kleb pneumo UTI First Dose: Yes Last Day of Therapy:  10/20/20 Labs - Once weekly:  CBC/D and BMP, Labs - Every other week:  ESR and CRP Method of administration: Mini-Bag Plus / Gravity Method of administration may be changed at the discretion of home infusion pharmacist based upon assessment of the patient and/or caregiver's ability to self-administer the medication ordered. Start taking on: October 17, 2020   ferrous sulfate 325 (65 FE) MG EC tablet Take 325 mg by mouth daily.   fluticasone 50 MCG/ACT nasal spray Commonly known as: FLONASE Place 2 sprays into both nostrils daily.   gabapentin 300 MG capsule Commonly known as: NEURONTIN Take 1 capsule (300 mg total) by mouth at bedtime.   Lantus SoloStar 100 UNIT/ML Solostar Pen Generic drug: insulin glargine Inject 8 Units into the skin 2 (two) times daily.   metFORMIN 500 MG 24 hr tablet Commonly known as: GLUCOPHAGE-XR Take 500 mg by mouth daily with breakfast.   metoprolol tartrate 25 MG tablet Commonly known as: LOPRESSOR Take 0.5 tablets (12.5 mg total) by mouth 2 (two) times daily.   Neupro 8 MG/24HR Pt24 Generic drug: Rotigotine Place 1 patch onto the skin every  evening.   Ozempic (0.25 or 0.5 MG/DOSE) 2 MG/1.5ML Sopn Generic drug: Semaglutide(0.25 or 0.5MG/DOS) Inject 0.5 mg into the skin every Tuesday.   polyethylene glycol powder 17 GM/SCOOP powder Commonly known as: GLYCOLAX/MIRALAX Take 17 g by mouth daily.   senna-docusate 8.6-50 MG tablet Commonly known as: Senokot-S Take 1 tablet by mouth 2 (two) times daily as needed for moderate constipation.   tadalafil 5 MG tablet Commonly known as: CIALIS Take 5 mg by mouth daily.  thiamine 100 MG tablet Take 1 tablet (100 mg total) by mouth daily.   traMADol 50 MG tablet Commonly known as: ULTRAM Take 50 mg by mouth daily.               Discharge Care Instructions  (From admission, onward)           Start     Ordered   10/16/20 0000  Change dressing on IV access line weekly and PRN  (Home infusion instructions - [facility] )        10/16/20 1544   10/16/20 0000  Discharge wound care:       Comments: For buttock pressure wound:  Cleanse with soap and water, pat dry Cover with Xeroform gauze then top with dry gauze 4x4 Top with silicone foam Replace xeroform twice daily, may reuse silicone foam for 3 days Change PRN for soiling   10/16/20 1544            Allergies  Allergen Reactions   Oxycodone Anaphylaxis    Other reaction(s): Other (See Comments) Cannot recall specifics   Iodinated Diagnostic Agents Hives    alleric to renografin,isovue & omnipaque, hives, requires 13 hr prep//a.calhoun, Onset Date: [id number]      Iodine Hives and Rash    alleric to renografin,isovue & omnipaque, hives, requires 13 hr prep//a.calhoun, Onset Date: [id number] allergic to renografin,isovue & omnipaque, hives, requires 13 hr prep//a.calhoun, Onset Date: [id number]   Linezolid Hives and Rash        Methadone Hcl Other (See Comments)    HALLUCINATIONS    Promethazine Other (See Comments)    Mental status change, DELIRIUM   Other reaction(s): erratic  behavior Other reaction(s): Other (See Comments) Mental status change DELIRIUM (pulled out IV)   Isovue [Iopamidol]    Morphine Sulfate Other (See Comments)    Pt not sure about allergy    Omnipaque [Iohexol] Hives    Code: HIVES, Desc: alleric to renografin,isovue & omnipaque, hives, requires 13 hr prep//a.calhoun, Onset Date: [id number]    Other Hives    Other reaction(s): erratic behanvior Other reaction(s): erratic behavior   Oxycodone Hcl Other (See Comments)    Pt not sure about allergy    Quetiapine Other (See Comments)    Pt not sure about allergy  Other reaction(s): Other (See Comments) Pt not sure about allergy  Pt not sure about allergy    Renografin [Diatrizoate]    Statins     Other reaction(s): muscle weakness Other reaction(s): muscle weakness   Zolpidem Other (See Comments)    Other reaction(s): erratic behanvior Other reaction(s): Delusions (intolerance) Altered mental status   Atorvastatin Itching and Other (See Comments)    Tired, weakness    Carbidopa-Levodopa Anxiety   Chlorhexidine Gluconate [Chlorhexidine] Hives and Rash   Clindamycin Rash   Colesevelam Other (See Comments)    tired    Doxazosin Rash   Lovastatin Other (See Comments)    Tired, nervousness    Rosuvastatin Other (See Comments)    Tired, weakness        Procedures/Studies: DG Thoracic Spine 2 View  Result Date: 10/11/2020 CLINICAL DATA:  Back pain, fall EXAM: THORACIC SPINE 2 VIEWS COMPARISON:  None. FINDINGS: Thoracic alignment within normal limits. Vertebral body heights are maintained. Mild degenerative osteophytes. IMPRESSION: No acute osseous abnormality Electronically Signed   By: Donavan Foil M.D.   On: 10/11/2020 21:51   DG Lumbar Spine Complete  Result Date: 10/11/2020 CLINICAL DATA:  Back pain, fall  EXAM: LUMBAR SPINE - COMPLETE 4+ VIEW COMPARISON:  10/06/2020 FINDINGS: Stable lumbar alignment. Trace anterolisthesis L4 on L5. Vertebral body heights are maintained.  Moderate disc space narrowing and degenerative change at L5-S1. Aortic atherosclerosis. Oval calcification is seen in left upper quadrant on one view only IMPRESSION: No acute osseous abnormality Electronically Signed   By: Donavan Foil M.D.   On: 10/11/2020 21:50   DG Lumbar Spine Complete  Result Date: 10/06/2020 CLINICAL DATA:  Fall, low back pain on the right side EXAM: LUMBAR SPINE - COMPLETE 4+ VIEW COMPARISON:  09/29/2020 FINDINGS: Vascular calcifications noted. A catheter projects over the lower pelvis. Spurring of the SI joints noted. Degenerative facet arthropathy bilaterally at L4-5 and L5-S1. Prominent space in the facet joints at L4-5 may reflect facet joint effusions. Loss of intervertebral disc height at L5-S1, similar to prior, with endplate sclerosis which is likely degenerative. Stable 3 mm degenerative anterolisthesis at L4-5. No acute fracture is identified. Aortoiliac atherosclerotic vascular disease. IMPRESSION: 1. Stable lower lumbar spondylosis and degenerative disc disease. No acute findings. 2. Grade 1 degenerative anterolisthesis at L4-5, stable. 3.  Aortic Atherosclerosis (ICD10-I70.0). Electronically Signed   By: Van Clines M.D.   On: 10/06/2020 13:24   DG Lumbar Spine Complete  Result Date: 09/29/2020 CLINICAL DATA:  Fall, back pain EXAM: LUMBAR SPINE - COMPLETE 4+ VIEW COMPARISON:  CT 02/09/2010 FINDINGS: No acute fracture or listhesis of the lumbar spine. Vertebral body height has been preserved. There is intervertebral disc space narrowing and endplate remodeling at K9-X8 in keeping with changes of advanced degenerative disc disease. Remaining intervertebral disc heights are preserved. The paraspinal soft tissues are unremarkable. Foley catheter balloon incidentally noted within the expected bladder. Vascular calcifications are seen within the abdominal aorta. IMPRESSION: No acute fracture or listhesis. Electronically Signed   By: Fidela Salisbury MD   On: 09/29/2020  02:13   DG Knee 2 Views Left  Result Date: 10/11/2020 CLINICAL DATA:  Fall, bilateral knee pain. EXAM: LEFT KNEE - 1-2 VIEW COMPARISON:  None. FINDINGS: No evidence of fracture, dislocation, or joint effusion. Normal joint spaces. Advanced vascular calcifications. Soft tissues are unremarkable. IMPRESSION: 1. No fracture or subluxation of the left knee. 2. Advanced vascular calcifications. Electronically Signed   By: Keith Rake M.D.   On: 10/11/2020 20:44   DG Knee 2 Views Right  Result Date: 10/11/2020 CLINICAL DATA:  Post fall with bilateral knee pain. EXAM: RIGHT KNEE - 1-2 VIEW COMPARISON:  None. FINDINGS: No evidence of fracture, dislocation, or joint effusion. Normal joint spaces. Prominent vascular calcifications. Soft tissues are unremarkable. IMPRESSION: 1. No fracture or subluxation of the right knee. 2. Prominent vascular calcifications. Electronically Signed   By: Keith Rake M.D.   On: 10/11/2020 20:43   CT Head Wo Contrast  Result Date: 10/11/2020 CLINICAL DATA:  Fall EXAM: CT HEAD WITHOUT CONTRAST CT CERVICAL SPINE WITHOUT CONTRAST TECHNIQUE: Multidetector CT imaging of the head and cervical spine was performed following the standard protocol without intravenous contrast. Multiplanar CT image reconstructions of the cervical spine were also generated. COMPARISON:  10/06/2020 head CT and cervical spine CT FINDINGS: CT HEAD FINDINGS Brain: There is no mass, hemorrhage or extra-axial collection. There is generalized atrophy without lobar predilection. There is hypoattenuation of the periventricular white matter, most commonly indicating chronic ischemic microangiopathy. Vascular: Atherosclerotic calcification of the internal carotid arteries at the skull base. No abnormal hyperdensity of the major intracranial arteries or dural venous sinuses. Skull: The visualized skull base, calvarium and  extracranial soft tissues are normal. Sinuses/Orbits: No fluid levels or advanced mucosal  thickening of the visualized paranasal sinuses. No mastoid or middle ear effusion. The orbits are normal. CT CERVICAL SPINE FINDINGS Cervical spine imaging is severely degraded by motion. Within that limitation, there is no acute fracture. Alignment is grossly normal. IMPRESSION: 1. Chronic ischemic microangiopathy and generalized atrophy without acute intracranial abnormality. 2. Cervical spine imaging is severely degraded by motion. Within that limitation, there is no acute fracture or static subluxation of the cervical spine. Electronically Signed   By: Ulyses Jarred M.D.   On: 10/11/2020 21:10   CT Head Wo Contrast  Result Date: 10/06/2020 CLINICAL DATA:  Fall at home today. EXAM: CT HEAD WITHOUT CONTRAST CT CERVICAL SPINE WITHOUT CONTRAST TECHNIQUE: Multidetector CT imaging of the head and cervical spine was performed following the standard protocol without intravenous contrast. Multiplanar CT image reconstructions of the cervical spine were also generated. COMPARISON:  September 29, 2020. FINDINGS: CT HEAD FINDINGS Brain: Mild diffuse cortical atrophy is noted. Mild chronic ischemic white matter disease is noted. No mass effect or midline shift is noted. Ventricular size is within normal limits. There is no evidence of mass lesion, hemorrhage or acute infarction. Vascular: No hyperdense vessel or unexpected calcification. Skull: Normal. Negative for fracture or focal lesion. Sinuses/Orbits: No acute finding. Other: None. CT CERVICAL SPINE FINDINGS Alignment: Normal. Skull base and vertebrae: No acute fracture. No primary bone lesion or focal pathologic process. Soft tissues and spinal canal: No prevertebral fluid or swelling. No visible canal hematoma. Disc levels: Moderate degenerative disc disease is noted at C3-4, C4-5, C5-6 and C6-7. Upper chest: Negative. Other: Degenerative changes are seen involving the right-sided posterior facet joints. IMPRESSION: No acute intracranial abnormality seen. Multilevel  degenerative disc disease. No acute abnormality seen in the cervical spine. Electronically Signed   By: Marijo Conception M.D.   On: 10/06/2020 14:41   CT HEAD WO CONTRAST  Result Date: 09/29/2020 CLINICAL DATA:  Fall, chronic anticoagulation EXAM: CT HEAD WITHOUT CONTRAST CT CERVICAL SPINE WITHOUT CONTRAST TECHNIQUE: Multidetector CT imaging of the head and cervical spine was performed following the standard protocol without intravenous contrast. Multiplanar CT image reconstructions of the cervical spine were also generated. COMPARISON:  MRI C-spine 09/19/2020, CT head 09/16/2020 FINDINGS: CT HEAD FINDINGS Brain: Normal anatomic configuration. Parenchymal volume loss is commensurate with the patient's age. Moderate periventricular white matter changes are present likely reflecting the sequela of small vessel ischemia. Remote lacunar infarcts are noted within the lentiform nuclei bilaterally as well as the right caudate nucleus. No abnormal intra or extra-axial mass lesion or fluid collection. No abnormal mass effect or midline shift. No evidence of acute intracranial hemorrhage or infarct. Ventricular size is normal. Cerebellum unremarkable. Vascular: No asymmetric hyperdense vasculature at the skull base. Advanced vascular calcifications are noted within the carotid siphons. Skull: Intact Sinuses/Orbits: Paranasal sinuses are clear. Orbits are unremarkable. Other: Mastoid air cells and middle ear cavities are clear. CT CERVICAL SPINE FINDINGS Alignment: Stable mild retrolisthesis at C4-5. Skull base and vertebrae: Craniocervical alignment is normal. The atlantodental interval is not widened. No acute fracture of the cervical spine. Ankylosis of the left C2-3 facet joint. Soft tissues and spinal canal: Moderate central canal stenosis with mild flattening of the thecal sac at C4-5 and C5-6. No canal hematoma. The prevertebral soft tissues are not thickened. No paraspinal fluid collections. Disc levels: There is  diffuse intervertebral disc space narrowing and endplate remodeling throughout the cervical spine in keeping  with changes of moderate to severe degenerative disc disease. Vertebral body height has been preserved. The prevertebral soft tissues are not thickened on sagittal reformats. Review of the axial images demonstrates multilevel uncovertebral and facet arthrosis resulting in multilevel mild-to-moderate neuroforaminal narrowing, most severe on the left at C5-6, Upper chest: Unremarkable Other: None IMPRESSION: No acute intracranial abnormality.  No calvarial fracture. No acute fracture or listhesis of the cervical spine. Electronically Signed   By: Fidela Salisbury MD   On: 09/29/2020 02:22   CT Cervical Spine Wo Contrast  Result Date: 10/11/2020 CLINICAL DATA:  Fall EXAM: CT HEAD WITHOUT CONTRAST CT CERVICAL SPINE WITHOUT CONTRAST TECHNIQUE: Multidetector CT imaging of the head and cervical spine was performed following the standard protocol without intravenous contrast. Multiplanar CT image reconstructions of the cervical spine were also generated. COMPARISON:  10/06/2020 head CT and cervical spine CT FINDINGS: CT HEAD FINDINGS Brain: There is no mass, hemorrhage or extra-axial collection. There is generalized atrophy without lobar predilection. There is hypoattenuation of the periventricular white matter, most commonly indicating chronic ischemic microangiopathy. Vascular: Atherosclerotic calcification of the internal carotid arteries at the skull base. No abnormal hyperdensity of the major intracranial arteries or dural venous sinuses. Skull: The visualized skull base, calvarium and extracranial soft tissues are normal. Sinuses/Orbits: No fluid levels or advanced mucosal thickening of the visualized paranasal sinuses. No mastoid or middle ear effusion. The orbits are normal. CT CERVICAL SPINE FINDINGS Cervical spine imaging is severely degraded by motion. Within that limitation, there is no acute fracture.  Alignment is grossly normal. IMPRESSION: 1. Chronic ischemic microangiopathy and generalized atrophy without acute intracranial abnormality. 2. Cervical spine imaging is severely degraded by motion. Within that limitation, there is no acute fracture or static subluxation of the cervical spine. Electronically Signed   By: Ulyses Jarred M.D.   On: 10/11/2020 21:10   CT Cervical Spine Wo Contrast  Result Date: 10/06/2020 CLINICAL DATA:  Fall at home today. EXAM: CT HEAD WITHOUT CONTRAST CT CERVICAL SPINE WITHOUT CONTRAST TECHNIQUE: Multidetector CT imaging of the head and cervical spine was performed following the standard protocol without intravenous contrast. Multiplanar CT image reconstructions of the cervical spine were also generated. COMPARISON:  September 29, 2020. FINDINGS: CT HEAD FINDINGS Brain: Mild diffuse cortical atrophy is noted. Mild chronic ischemic white matter disease is noted. No mass effect or midline shift is noted. Ventricular size is within normal limits. There is no evidence of mass lesion, hemorrhage or acute infarction. Vascular: No hyperdense vessel or unexpected calcification. Skull: Normal. Negative for fracture or focal lesion. Sinuses/Orbits: No acute finding. Other: None. CT CERVICAL SPINE FINDINGS Alignment: Normal. Skull base and vertebrae: No acute fracture. No primary bone lesion or focal pathologic process. Soft tissues and spinal canal: No prevertebral fluid or swelling. No visible canal hematoma. Disc levels: Moderate degenerative disc disease is noted at C3-4, C4-5, C5-6 and C6-7. Upper chest: Negative. Other: Degenerative changes are seen involving the right-sided posterior facet joints. IMPRESSION: No acute intracranial abnormality seen. Multilevel degenerative disc disease. No acute abnormality seen in the cervical spine. Electronically Signed   By: Marijo Conception M.D.   On: 10/06/2020 14:41   CT CERVICAL SPINE WO CONTRAST  Result Date: 09/29/2020 CLINICAL DATA:  Fall,  chronic anticoagulation EXAM: CT HEAD WITHOUT CONTRAST CT CERVICAL SPINE WITHOUT CONTRAST TECHNIQUE: Multidetector CT imaging of the head and cervical spine was performed following the standard protocol without intravenous contrast. Multiplanar CT image reconstructions of the cervical spine were also generated.  COMPARISON:  MRI C-spine 09/19/2020, CT head 09/16/2020 FINDINGS: CT HEAD FINDINGS Brain: Normal anatomic configuration. Parenchymal volume loss is commensurate with the patient's age. Moderate periventricular white matter changes are present likely reflecting the sequela of small vessel ischemia. Remote lacunar infarcts are noted within the lentiform nuclei bilaterally as well as the right caudate nucleus. No abnormal intra or extra-axial mass lesion or fluid collection. No abnormal mass effect or midline shift. No evidence of acute intracranial hemorrhage or infarct. Ventricular size is normal. Cerebellum unremarkable. Vascular: No asymmetric hyperdense vasculature at the skull base. Advanced vascular calcifications are noted within the carotid siphons. Skull: Intact Sinuses/Orbits: Paranasal sinuses are clear. Orbits are unremarkable. Other: Mastoid air cells and middle ear cavities are clear. CT CERVICAL SPINE FINDINGS Alignment: Stable mild retrolisthesis at C4-5. Skull base and vertebrae: Craniocervical alignment is normal. The atlantodental interval is not widened. No acute fracture of the cervical spine. Ankylosis of the left C2-3 facet joint. Soft tissues and spinal canal: Moderate central canal stenosis with mild flattening of the thecal sac at C4-5 and C5-6. No canal hematoma. The prevertebral soft tissues are not thickened. No paraspinal fluid collections. Disc levels: There is diffuse intervertebral disc space narrowing and endplate remodeling throughout the cervical spine in keeping with changes of moderate to severe degenerative disc disease. Vertebral body height has been preserved. The  prevertebral soft tissues are not thickened on sagittal reformats. Review of the axial images demonstrates multilevel uncovertebral and facet arthrosis resulting in multilevel mild-to-moderate neuroforaminal narrowing, most severe on the left at C5-6, Upper chest: Unremarkable Other: None IMPRESSION: No acute intracranial abnormality.  No calvarial fracture. No acute fracture or listhesis of the cervical spine. Electronically Signed   By: Fidela Salisbury MD   On: 09/29/2020 02:22   MR BRAIN WO CONTRAST  Result Date: 09/19/2020 CLINICAL DATA:  Acute neuro deficit EXAM: MRI HEAD WITHOUT CONTRAST TECHNIQUE: Multiplanar, multiecho pulse sequences of the brain and surrounding structures were obtained without intravenous contrast. COMPARISON:  CT head 09/16/2020 FINDINGS: Brain: Generalized atrophy without hydrocephalus. Hyperintensity and volume loss in the periventricular white matter right greater than left due to chronic ischemia. Chronic basal ganglia infarcts bilaterally. Mild chronic ischemic change in the pons. Negative for acute infarct, hemorrhage, mass negative for hydrocephalus Vascular: Normal arterial flow voids. Skull and upper cervical spine: Negative Sinuses/Orbits: Air-fluid level in the sphenoid sinus. Remaining paranasal sinuses clear. Negative orbit Other: None IMPRESSION: No acute abnormality. Atrophy and moderate chronic microvascular ischemic changes. Electronically Signed   By: Franchot Gallo M.D.   On: 09/19/2020 10:03   MR CERVICAL SPINE WO CONTRAST  Result Date: 09/19/2020 CLINICAL DATA:  Myelopathy. EXAM: MRI CERVICAL SPINE WITHOUT CONTRAST TECHNIQUE: Multiplanar, multisequence MR imaging of the cervical spine was performed. No intravenous contrast was administered. COMPARISON:  None. FINDINGS: Alignment: Mild retrolisthesis C4-5.  Otherwise normal alignment Vertebrae: Negative for fracture or mass. Mild bone marrow edema on the left involving the superior endplate of C5 likely  hemangioma. This is intermediate signal on T1. No priors for comparison. Small area of fatty marrow on the left C4. Possible hemangioma. Cord: Cord evaluation limited by motion. No cord signal abnormality identified. Posterior Fossa, vertebral arteries, paraspinal tissues: Negative Disc levels: C2-3: Negative C3-4: Moderate disc degeneration with disc space narrowing and diffuse uncinate spurring. Mild spinal stenosis. Moderate foraminal stenosis bilaterally due to spurring C4-5: Moderate disc degeneration with disc space narrowing and diffuse uncinate spurring. Mild spinal stenosis. Moderate foraminal stenosis bilaterally due to spurring. C6-7: Disc  degeneration with diffuse uncinate spurring. Mild foraminal narrowing bilaterally C6-7: Disc degeneration with diffuse uncinate spurring. Mild foraminal narrowing bilaterally. C7-T1: Negative for stenosis. IMPRESSION: Cervical spondylosis. Spinal and foraminal stenosis most prominent C3-4 and C4-5. No acute abnormality. Electronically Signed   By: Franchot Gallo M.D.   On: 09/19/2020 10:08   CT Hip Right Wo Contrast  Result Date: 10/06/2020 CLINICAL DATA:  Right hip pain after fall. EXAM: CT OF THE RIGHT HIP WITHOUT CONTRAST TECHNIQUE: Multidetector CT imaging of the right hip was performed according to the standard protocol. Multiplanar CT image reconstructions were also generated. COMPARISON:  Right hip x-rays from same day. CT abdomen pelvis dated September 14, 2020. FINDINGS: Bones/Joint/Cartilage No fracture or dislocation. Mild right hip osteoarthritis. No joint effusion. Ligaments Ligaments are suboptimally evaluated by CT. Muscles and Tendons Grossly intact. Soft tissue No fluid collection or hematoma. No soft tissue mass. Foley catheter in the bladder. IMPRESSION: 1. No acute osseous abnormality. 2. Mild right hip osteoarthritis. Electronically Signed   By: Titus Dubin M.D.   On: 10/06/2020 14:20   DG Chest Portable 1 View  Result Date: 10/11/2020 CLINICAL  DATA:  Fall EXAM: PORTABLE CHEST 1 VIEW COMPARISON:  09/16/2020, CT 12/21/2019, radiograph 05/02/2016 FINDINGS: Diminished lung volumes with elevation of right diaphragm. Streaky atelectasis at the right base. No consolidation, pleural effusion or pneumothorax. Stable cardiomediastinal silhouette. IMPRESSION: Low lung volumes with elevated right diaphragm and subsegmental atelectasis at the right base. Electronically Signed   By: Donavan Foil M.D.   On: 10/11/2020 21:26   CT Renal Stone Study  Result Date: 10/12/2020 CLINICAL DATA:  Hematuria EXAM: CT ABDOMEN AND PELVIS WITHOUT CONTRAST TECHNIQUE: Multidetector CT imaging of the abdomen and pelvis was performed following the standard protocol without IV contrast. COMPARISON:  09/14/2020 FINDINGS: Motion degraded images. Lower chest: Mild subpleural reticulation in the lungs bilaterally, likely post infectious/inflammatory. Hepatobiliary: Unenhanced liver is unremarkable. Gallbladder is unremarkable. No intrahepatic or extrahepatic ductal dilatation. Pancreas: Within normal limits. Spleen: Within normal limits. Adrenals/Urinary Tract: Adrenal glands are within normal limits. Two nonobstructing right upper pole renal calculi measuring up to 2 mm (series 3/image 43), unchanged. Additional bilateral renal cysts, including a 16 mm hyperdense probable cyst in the lateral interpolar left kidney (series 3/image 52), poorly evaluated on unenhanced CT. No hydronephrosis. Bladder is decompressed by an indwelling Foley catheter. Stomach/Bowel: Stomach is within normal limits. No evidence of bowel obstruction. Normal appendix (series 3/image 70). Mild sigmoid diverticulosis, without evidence of diverticulitis. Vascular/Lymphatic: No evidence of abdominal aortic aneurysm. Atherosclerotic calcifications of the abdominal aorta and branch vessels. No suspicious abdominopelvic lymphadenopathy. Reproductive: Prostate is unremarkable. Other: No abdominopelvic ascites.  Musculoskeletal: Degenerative changes of the visualized thoracolumbar spine. IMPRESSION: Motion degraded images. Two nonobstructing right upper pole renal calculi measuring up to 2 mm, unchanged. No hydronephrosis. Bilateral renal cysts, poorly evaluated, including a 16 mm hyperdense probable cyst in the lateral interpolar left kidney. This previously measured 10 mm in 2015, supporting a benign etiology. Bladder decompressed by indwelling Foley catheter. Electronically Signed   By: Julian Hy M.D.   On: 10/12/2020 01:56   DG HIP UNILAT WITH PELVIS 2-3 VIEWS LEFT  Result Date: 10/11/2020 CLINICAL DATA:  Fall on blood thinners. EXAM: DG HIP (WITH OR WITHOUT PELVIS) 2-3V LEFT COMPARISON:  Abdomen 01/11/2015 FINDINGS: Catheter projected over the midline likely representing a Foley catheter. Pelvis and left hip appear intact. No acute displaced fractures identified. Vascular calcifications. SI joints and symphysis pubis are not displaced. IMPRESSION: No acute  displaced fractures identified. Electronically Signed   By: Lucienne Capers M.D.   On: 10/11/2020 20:48   DG HIP UNILAT WITH PELVIS 2-3 VIEWS RIGHT  Result Date: 10/11/2020 CLINICAL DATA:  [place] on blood thinners. EXAM: DG HIP (WITH OR WITHOUT PELVIS) 2-3V RIGHT COMPARISON:  10/06/2020 FINDINGS: Foley catheter. Right hip appears intact. No evidence of acute fracture or dislocation. No focal bone lesion or bone destruction. Mild degenerative changes in the hips. Vascular calcifications. IMPRESSION: No acute displaced fractures identified. Electronically Signed   By: Lucienne Capers M.D.   On: 10/11/2020 20:49   DG Hip Unilat W or Wo Pelvis 2-3 Views Right  Result Date: 10/06/2020 CLINICAL DATA:  [place] low back pain on the right side.  Fall. EXAM: DG HIP (WITH OR WITHOUT PELVIS) 2-3V RIGHT COMPARISON:  CT pelvis 09/14/2020 FINDINGS: A catheter is noted projecting over the central pelvis. Mild spurring of the SI joints. Lower lumbar spondylosis and  degenerative disc disease. Vascular calcifications. Moderate craniocaudad and axial loss of articular space in both hips with mild associated spurring. There is some ridging of the junction of the right femoral head and neck which is probably degenerative rather than posttraumatic. I not see a discrete fracture. IMPRESSION: 1. No fracture identified. If the patient is unable to bear weight then cross-sectional imaging may be warranted. 2. Lower lumbar spondylosis and degenerative disc disease. 3. Moderate degenerative arthropathy of both hips. Electronically Signed   By: Van Clines M.D.   On: 10/06/2020 13:27   [place] EKG SITE RITE  Result Date: 10/15/2020 If Site Rite image not attached, placement could not be confirmed due to current cardiac rhythm.     Subjective: No fever, no confusion, no vomiting, no chest or abdominal pain.  Mild flank pain.  No tachycardia.  Taking PO well.  Mentation oriented.    Discharge Exam: Vitals:   10/16/20 0831 10/16/20 1158  BP: 136/70 (!) 155/75  Pulse: 85 75  Resp: 17 16  Temp: 97.9 F (36.6 C) 98.2 F (36.8 C)  SpO2: 98% 100%   Vitals:   10/16/20 0011 10/16/20 0636 10/16/20 0831 10/16/20 1158  BP: (!) 149/65 132/69 136/70 (!) 155/75  Pulse: 79  85 75  Resp: _0 Temp: 97.9 F (36.6 C) 98.2 F (36.8 C) 97.9 F (36.6 C) 98.2 F (36.8 C)  TempSrc: Oral Axillary Oral Oral  SpO2: 99% 96% 98% 100%  Weight:      Height:        General: Pt is alert, awake, not in acute distress, appears tired. Cardiovascular: RRR, nl S1-S2, no murmurs appreciated.   No LE edema.   Respiratory: Normal respiratory rate and rhythm.  CTAB without rales or wheezes. Abdominal: Abdomen soft and non-tender.  No distension or HSM.   Neuro/Psych: Strength symmetric in upper and lower extremities.  Judgment and insight appear normal.   The results of significant diagnostics from this hospitalization (including imaging, microbiology, ancillary and laboratory)  are listed below for reference.     Microbiology: Recent Results (from the past 240 hour(s))  Urine Culture     Status: Abnormal   Collection Time: 10/11/20  8:16 PM   Specimen: Urine, Random  Result Value Ref Range Status   Specimen Description URINE, RANDOM  Final   Special Requests   Final    NONE Performed at [facility], [address] [place], [place] [address]    Culture (A)  Final    >=100,000 COLONIES/mL  KLEBSIELLA PNEUMONIAE Confirmed Extended Spectrum Beta-Lactamase Producer (ESBL).  In bloodstream infections from ESBL organisms, carbapenems are preferred over piperacillin/tazobactam. They are shown to have a lower risk of mortality.    Report Status 10/14/2020 FINAL  Final   Organism ID, Bacteria KLEBSIELLA PNEUMONIAE (A)  Final      Susceptibility   Klebsiella pneumoniae - MIC*    AMPICILLIN >=32 RESISTANT Resistant     CEFAZOLIN >=64 RESISTANT Resistant     CEFEPIME >=32 RESISTANT Resistant     CEFTRIAXONE >=64 RESISTANT Resistant     CIPROFLOXACIN 2 INTERMEDIATE Intermediate     GENTAMICIN >=16 RESISTANT Resistant     IMIPENEM <=0.25 SENSITIVE Sensitive     NITROFURANTOIN 128 RESISTANT Resistant     TRIMETH/SULFA >=320 RESISTANT Resistant     AMPICILLIN/SULBACTAM >=32 RESISTANT Resistant     PIP/TAZO 16 SENSITIVE Sensitive     * >=100,000 COLONIES/mL KLEBSIELLA PNEUMONIAE  Resp Panel by RT-PCR (Flu A&B, Covid) Nasopharyngeal Swab     Status: None   Collection Time: 10/12/20  1:55 AM   Specimen: Nasopharyngeal Swab; Nasopharyngeal(NP) swabs in vial transport medium  Result Value Ref Range Status   SARS Coronavirus 2 by RT PCR NEGATIVE NEGATIVE Final    Comment: (NOTE) SARS-CoV-2 target nucleic acids are NOT DETECTED.  The SARS-CoV-2 RNA is generally detectable in upper respiratory specimens during the acute phase of infection. The lowest concentration of SARS-CoV-2 viral copies this assay can detect is 138 copies/mL. A negative result does not  preclude SARS-Cov-2 infection and should not be used as the sole basis for treatment or other patient management decisions. A negative result may occur with  improper specimen collection/handling, submission of specimen other than nasopharyngeal swab, presence of viral mutation(s) within the areas targeted by this assay, and inadequate number of viral copies(<138 copies/mL). A negative result must be combined with clinical observations, patient history, and epidemiological information. The expected result is Negative.  Fact Sheet for Patients:  EntrepreneurPulse.com.au  Fact Sheet for Healthcare Providers:  IncredibleEmployment.be  This test is no t yet approved or cleared by the [place] FDA and  has been authorized for detection and/or diagnosis of SARS-CoV-2 by FDA under an Emergency Use Authorization (EUA). This EUA will remain  in effect (meaning this test can be used) for the duration of the COVID-19 declaration under Section 564(b)(1) of the Act, 21 U.S.C.section 360bbb-3(b)(1), unless the authorization is terminated  or revoked sooner.       Influenza A by PCR NEGATIVE NEGATIVE Final   Influenza B by PCR NEGATIVE NEGATIVE Final    Comment: (NOTE) The Xpert Xpress SARS-CoV-2/FLU/RSV plus assay is intended as an aid in the diagnosis of influenza from Nasopharyngeal swab specimens and should not be used as a sole basis for treatment. Nasal washings and aspirates are unacceptable for Xpert Xpress SARS-CoV-2/FLU/RSV testing.  Fact Sheet for Patients: EntrepreneurPulse.com.au  Fact Sheet for Healthcare Providers: IncredibleEmployment.be  This test is not yet approved or cleared by the [place] FDA and has been authorized for detection and/or diagnosis of SARS-CoV-2 by FDA under an Emergency Use Authorization (EUA). This EUA will remain in effect (meaning this test can be used) for the  duration of the COVID-19 declaration under Section 564(b)(1) of the Act, 21 U.S.C. section 360bbb-3(b)(1), unless the authorization is terminated or revoked.  Performed at [facility], [place] [address], [place], [place] [address]   Resp Panel by RT-PCR (Flu A&B, Covid) Nasopharyngeal Swab     Status: None  Collection Time: 10/16/20 12:58 PM   Specimen: Nasopharyngeal Swab; Nasopharyngeal(NP) swabs in vial transport medium  Result Value Ref Range Status   SARS Coronavirus 2 by RT PCR NEGATIVE NEGATIVE Final    Comment: (NOTE) SARS-CoV-2 target nucleic acids are NOT DETECTED.  The SARS-CoV-2 RNA is generally detectable in upper respiratory specimens during the acute phase of infection. The lowest concentration of SARS-CoV-2 viral copies this assay can detect is 138 copies/mL. A negative result does not preclude SARS-Cov-2 infection and should not be used as the sole basis for treatment or other patient management decisions. A negative result may occur with  improper specimen collection/handling, submission of specimen other than nasopharyngeal swab, presence of viral mutation(s) within the areas targeted by this assay, and inadequate number of viral copies(<138 copies/mL). A negative result must be combined with clinical observations, patient history, and epidemiological information. The expected result is Negative.  Fact Sheet for Patients:  EntrepreneurPulse.com.au  Fact Sheet for Healthcare Providers:  IncredibleEmployment.be  This test is no t yet approved or cleared by the [place] FDA and  has been authorized for detection and/or diagnosis of SARS-CoV-2 by FDA under an Emergency Use Authorization (EUA). This EUA will remain  in effect (meaning this test can be used) for the duration of the COVID-19 declaration under Section 564(b)(1) of the Act, 21 U.S.C.section 360bbb-3(b)(1), unless the authorization is terminated  or  revoked sooner.       Influenza A by PCR NEGATIVE NEGATIVE Final   Influenza B by PCR NEGATIVE NEGATIVE Final    Comment: (NOTE) The Xpert Xpress SARS-CoV-2/FLU/RSV plus assay is intended as an aid in the diagnosis of influenza from Nasopharyngeal swab specimens and should not be used as a sole basis for treatment. Nasal washings and aspirates are unacceptable for Xpert Xpress SARS-CoV-2/FLU/RSV testing.  Fact Sheet for Patients: EntrepreneurPulse.com.au  Fact Sheet for Healthcare Providers: IncredibleEmployment.be  This test is not yet approved or cleared by the [place] FDA and has been authorized for detection and/or diagnosis of SARS-CoV-2 by FDA under an Emergency Use Authorization (EUA). This EUA will remain in effect (meaning this test can be used) for the duration of the COVID-19 declaration under Section 564(b)(1) of the Act, 21 U.S.C. section 360bbb-3(b)(1), unless the authorization is terminated or revoked.  Performed at [facility], [place] [address] [place], [place] [address]      Labs: BNP (last 3 results) Recent Labs    09/10/20 1108  BNP 57.3   Basic Metabolic Panel: Recent Labs  Lab 10/11/20 2006 10/12/20 0331 10/13/20 0100 10/14/20 0101 10/15/20 0859  NA 135 139 133* 133* 133*  K 4.7 4.4 3.8 3.9 4.0  CL 99 105 99 102 100  CO2 20* _0 GLUCOSE 304* 191* 154* 97 206*  BUN _1 CREATININE 1.47* 1.23 1.02 0.96 1.01  CALCIUM 9.8 9.2 8.6* 8.4* 8.7*  MG  --  1.7  --   --   --   PHOS  --  3.7  --   --   --    Liver Function Tests: No results for input(s): AST, ALT, ALKPHOS, BILITOT, PROT, ALBUMIN in the last 168 hours. No results for input(s): LIPASE, AMYLASE in the last 168 hours. No results for input(s): AMMONIA in the last 168 hours. CBC: Recent Labs  Lab 10/11/20 2006 10/12/20 0331 10/13/20 0100 10/14/20 0101 10/15/20 0859  WBC 11.3* 13.2* 12.2* 12.4* 8.6  HGB 17.4*  15.6 14.2  13.6 13.8  HCT 53.0* 48.5 43.2 41.3 43.5  MCV 86.6 86.8 86.9 87.1 87.5  PLT 251 232 243 190 177   Cardiac Enzymes: No results for input(s): CKTOTAL, CKMB, CKMBINDEX, TROPONINI in the last 168 hours. BNP: Invalid input(s): POCBNP CBG: Recent Labs  Lab 10/15/20 1211 10/15/20 1711 10/15/20 2118 10/16/20 0830 10/16/20 1156  GLUCAP 185* 140* 111* 247* 166*   D-Dimer No results for input(s): DDIMER in the last 72 hours. Hgb A1c No results for input(s): HGBA1C in the last 72 hours. Lipid Profile No results for input(s): CHOL, HDL, LDLCALC, TRIG, CHOLHDL, LDLDIRECT in the last 72 hours. Thyroid function studies No results for input(s): TSH, T4TOTAL, T3FREE, THYROIDAB in the last 72 hours.  Invalid input(s): FREET3 Anemia work up No results for input(s): VITAMINB12, FOLATE, FERRITIN, TIBC, IRON, RETICCTPCT in the last 72 hours. Urinalysis    Component Value Date/Time   COLORURINE YELLOW 10/11/2020 2016   APPEARANCEUR CLOUDY (A) 10/11/2020 2016   LABSPEC 1.011 10/11/2020 2016   PHURINE 5.0 10/11/2020 2016   GLUCOSEU >=500 (A) 10/11/2020 2016   HGBUR LARGE (A) 10/11/2020 2016   BILIRUBINUR NEGATIVE 10/11/2020 2016   KETONESUR NEGATIVE 10/11/2020 2016   PROTEINUR 100 (A) 10/11/2020 2016   UROBILINOGEN 0.2 12/04/2013 0210   NITRITE NEGATIVE 10/11/2020 2016   LEUKOCYTESUR LARGE (A) 10/11/2020 2016   Sepsis Labs Invalid input(s): PROCALCITONIN,  WBC,  LACTICIDVEN Microbiology Recent Results (from the past 240 hour(s))  Urine Culture     Status: Abnormal   Collection Time: 10/11/20  8:16 PM   Specimen: Urine, Random  Result Value Ref Range Status   Specimen Description URINE, RANDOM  Final   Special Requests   Final    NONE Performed at [facility], [address], [place], [place] [address]    Culture (A)  Final    >=100,000 COLONIES/mL KLEBSIELLA PNEUMONIAE Confirmed Extended Spectrum Beta-Lactamase Producer (ESBL).  In bloodstream infections from ESBL  organisms, carbapenems are preferred over piperacillin/tazobactam. They are shown to have a lower risk of mortality.    Report Status 10/14/2020 FINAL  Final   Organism ID, Bacteria KLEBSIELLA PNEUMONIAE (A)  Final      Susceptibility   Klebsiella pneumoniae - MIC*    AMPICILLIN >=32 RESISTANT Resistant     CEFAZOLIN >=64 RESISTANT Resistant     CEFEPIME >=32 RESISTANT Resistant     CEFTRIAXONE >=64 RESISTANT Resistant     CIPROFLOXACIN 2 INTERMEDIATE Intermediate     GENTAMICIN >=16 RESISTANT Resistant     IMIPENEM <=0.25 SENSITIVE Sensitive     NITROFURANTOIN 128 RESISTANT Resistant     TRIMETH/SULFA >=320 RESISTANT Resistant     AMPICILLIN/SULBACTAM >=32 RESISTANT Resistant     PIP/TAZO 16 SENSITIVE Sensitive     * >=100,000 COLONIES/mL KLEBSIELLA PNEUMONIAE  Resp Panel by RT-PCR (Flu A&B, Covid) Nasopharyngeal Swab     Status: None   Collection Time: 10/12/20  1:55 AM   Specimen: Nasopharyngeal Swab; Nasopharyngeal(NP) swabs in vial transport medium  Result Value Ref Range Status   SARS Coronavirus 2 by RT PCR NEGATIVE NEGATIVE Final    Comment: (NOTE) SARS-CoV-2 target nucleic acids are NOT DETECTED.  The SARS-CoV-2 RNA is generally detectable in upper respiratory specimens during the acute phase of infection. The lowest concentration of SARS-CoV-2 viral copies this assay can detect is 138 copies/mL. A negative result does not preclude SARS-Cov-2 infection and should not be used as the sole basis for treatment or other patient management decisions. A negative  result may occur with  improper specimen collection/handling, submission of specimen other than nasopharyngeal swab, presence of viral mutation(s) within the areas targeted by this assay, and inadequate number of viral copies(<138 copies/mL). A negative result must be combined with clinical observations, patient history, and epidemiological information. The expected result is Negative.  Fact Sheet for Patients:   EntrepreneurPulse.com.au  Fact Sheet for Healthcare Providers:  IncredibleEmployment.be  This test is no t yet approved or cleared by the [place] FDA and  has been authorized for detection and/or diagnosis of SARS-CoV-2 by FDA under an Emergency Use Authorization (EUA). This EUA will remain  in effect (meaning this test can be used) for the duration of the COVID-19 declaration under Section 564(b)(1) of the Act, 21 U.S.C.section 360bbb-3(b)(1), unless the authorization is terminated  or revoked sooner.       Influenza A by PCR NEGATIVE NEGATIVE Final   Influenza B by PCR NEGATIVE NEGATIVE Final    Comment: (NOTE) The Xpert Xpress SARS-CoV-2/FLU/RSV plus assay is intended as an aid in the diagnosis of influenza from Nasopharyngeal swab specimens and should not be used as a sole basis for treatment. Nasal washings and aspirates are unacceptable for Xpert Xpress SARS-CoV-2/FLU/RSV testing.  Fact Sheet for Patients: EntrepreneurPulse.com.au  Fact Sheet for Healthcare Providers: IncredibleEmployment.be  This test is not yet approved or cleared by the [place] FDA and has been authorized for detection and/or diagnosis of SARS-CoV-2 by FDA under an Emergency Use Authorization (EUA). This EUA will remain in effect (meaning this test can be used) for the duration of the COVID-19 declaration under Section 564(b)(1) of the Act, 21 U.S.C. section 360bbb-3(b)(1), unless the authorization is terminated or revoked.  Performed at [facility], [place] [address], [place], [place] [address]   Resp Panel by RT-PCR (Flu A&B, Covid) Nasopharyngeal Swab     Status: None   Collection Time: 10/16/20 12:58 PM   Specimen: Nasopharyngeal Swab; Nasopharyngeal(NP) swabs in vial transport medium  Result Value Ref Range Status   SARS Coronavirus 2 by RT PCR NEGATIVE NEGATIVE Final    Comment: (NOTE) SARS-CoV-2  target nucleic acids are NOT DETECTED.  The SARS-CoV-2 RNA is generally detectable in upper respiratory specimens during the acute phase of infection. The lowest concentration of SARS-CoV-2 viral copies this assay can detect is 138 copies/mL. A negative result does not preclude SARS-Cov-2 infection and should not be used as the sole basis for treatment or other patient management decisions. A negative result may occur with  improper specimen collection/handling, submission of specimen other than nasopharyngeal swab, presence of viral mutation(s) within the areas targeted by this assay, and inadequate number of viral copies(<138 copies/mL). A negative result must be combined with clinical observations, patient history, and epidemiological information. The expected result is Negative.  Fact Sheet for Patients:  EntrepreneurPulse.com.au  Fact Sheet for Healthcare Providers:  IncredibleEmployment.be  This test is no t yet approved or cleared by the [place] FDA and  has been authorized for detection and/or diagnosis of SARS-CoV-2 by FDA under an Emergency Use Authorization (EUA). This EUA will remain  in effect (meaning this test can be used) for the duration of the COVID-19 declaration under Section 564(b)(1) of the Act, 21 U.S.C.section 360bbb-3(b)(1), unless the authorization is terminated  or revoked sooner.       Influenza A by PCR NEGATIVE NEGATIVE Final   Influenza B by PCR NEGATIVE NEGATIVE Final    Comment: (NOTE) The Xpert Xpress SARS-CoV-2/FLU/RSV plus assay is intended  as an aid in the diagnosis of influenza from Nasopharyngeal swab specimens and should not be used as a sole basis for treatment. Nasal washings and aspirates are unacceptable for Xpert Xpress SARS-CoV-2/FLU/RSV testing.  Fact Sheet for Patients: EntrepreneurPulse.com.au  Fact Sheet for Healthcare  Providers: IncredibleEmployment.be  This test is not yet approved or cleared by the [place] FDA and has been authorized for detection and/or diagnosis of SARS-CoV-2 by FDA under an Emergency Use Authorization (EUA). This EUA will remain in effect (meaning this test can be used) for the duration of the COVID-19 declaration under Section 564(b)(1) of the Act, 21 U.S.C. section 360bbb-3(b)(1), unless the authorization is terminated or revoked.  Performed at [facility], [place] [address] [place], [place] [address]      Time coordinating discharge: 35 minutes The [place] controlled substances registry was reviewed for this patient      30 Day Unplanned Readmission Risk Score    Flowsheet Row ED to Hosp-Admission (Current) from 10/11/2020 in [facility]  30 Day Unplanned Readmission Risk Score (%) 32.15 Filed at 10/16/2020 1200       This score is the patient's risk of an unplanned readmission within 30 days of being discharged (0 -100%). The score is based on dignosis, age, lab data, medications, orders, and past utilization.   Low:  0-14.9   Medium: 15-21.9   High: 22-29.9   Extreme: 30 and above             SIGNED:   Edwin Dada, MD  [facility] 10/16/2020, 3:45 PM

## 2020-10-16 NOTE — TOC Transition Note (Signed)
Transition of Care [facility]) - CM/SW Discharge Note   Patient Details  Name: Jason Hartman MRN: [id number] Date of Birth: [birth year]  [facility] CM/SW Contact:  Emeterio Reeve, [place] Phone Number: 10/16/2020, 2:21 PM   Clinical Narrative:     Patient will DC to: [name] Anticipated DC date: 7/11 Family notified: Wife, Consuello Masse Transport by: Corey Harold     Per MD patient ready for DC to [facility] RN, patient, patient's family, and facility notified of DC. Discharge Summary and FL2 sent to facility. DC packet on chart. Ambulance transport requested for patient.    RN to call report to [phone]  CSW will sign off for now as social work intervention is no longer needed. Please consult [place] again if new needs arise.   Final next level of care: Skilled Nursing Facility Barriers to Discharge: Barriers Resolved   Patient Goals and CMS Choice Patient states their goals for this hospitalization and ongoing recovery are:: Pt and family are deciding between going home and returning to facility. CMS Medicare.gov Compare Post Acute Care list provided to:: Patient Choice offered to / list presented to : Patient  Discharge Placement              Patient chooses bed at: [facility] Patient to be transferred to facility by: [facility] Name of family member notified: Dorris Vangorder, wife Patient and family notified of of transfer: 10/16/20  Discharge Plan and Services     Post Acute Care Choice: [place]                               Social Determinants of Health (SDOH) Interventions     Readmission Risk Interventions No flowsheet data found.  Emeterio Reeve, Latanya Presser, [place] Social Worker [phone]

## 2020-10-16 NOTE — Progress Notes (Signed)
Patient refused CPAP for tonight. RT will monitor as needed. ?

## 2020-10-16 NOTE — Progress Notes (Signed)
PHARMACY NOTE:  ANTIMICROBIAL RENAL DOSAGE ADJUSTMENT  Current antimicrobial regimen includes a mismatch between antimicrobial dosage and estimated renal function.  As per policy approved by the Pharmacy & Therapeutics and Medical Executive Committees, the antimicrobial dosage will be adjusted accordingly.  Current antimicrobial dosage:  Merrem 2gm IV Q8H  Indication: ESBL Kleb pneumo UTI  Renal Function:  Estimated Creatinine Clearance: 53.8 mL/min (by C-G formula based on SCr of 1.01 mg/dL). []      On intermittent HD, scheduled: []      On CRRT    Antimicrobial dosage has been changed to:  Merrem 1gm IV Q8H  Additional comments:  considering changing to Invanz 1gm IV Q24H at discharge   Thank you for allowing pharmacy to be a part of this patient's care.  Staysha Truby D. Mina Marble, PharmD, BCPS, [place] 10/16/2020, 11:00 AM

## 2020-10-16 NOTE — TOC Progression Note (Signed)
Transition of Care [facility]) - Progression Note    Patient Details  Name: Jason Hartman MRN: [id number] Date of Birth: 1936/03/09  Transition of Care [facility] CM/SW [place], [place] Phone Number: 10/16/2020, 12:48 PM  Clinical Narrative:     CSW spoke to pts wife on phone. [name] stated she did not want husband to return the [facility]. CSW gave bed offers. [name] agreed that [facility] can accept pt today. [name] has accepted offer. Pts wife gave csw copy of covid vaccine card to give to [facility]. CSW requested rapid covid test.   Expected Discharge Plan: Skilled Nursing Facility Barriers to Discharge: Continued Medical Work up, Other (must enter comment) (Unsure of disposition at this time.)  Expected Discharge Plan and Services Expected Discharge Plan: [place] Choice: [place] arrangements for the past 2 months: [place]                                       Social Determinants of Health (SDOH) Interventions    Readmission Risk Interventions No flowsheet data found.  Emeterio Reeve, Latanya Presser, [place] Social Worker [phone]

## 2020-10-16 NOTE — Progress Notes (Signed)
Report called to [facility] gave report to Amy, Therapist, sports

## 2020-10-17 ENCOUNTER — Other Ambulatory Visit: Payer: Self-pay | Admitting: *Deleted

## 2020-10-17 ENCOUNTER — Ambulatory Visit: Payer: Medicare Other | Admitting: Physician Assistant

## 2020-10-17 DIAGNOSIS — G4733 Obstructive sleep apnea (adult) (pediatric): Secondary | ICD-10-CM | POA: Diagnosis not present

## 2020-10-17 DIAGNOSIS — L89892 Pressure ulcer of other site, stage 2: Secondary | ICD-10-CM | POA: Diagnosis not present

## 2020-10-17 DIAGNOSIS — F411 Generalized anxiety disorder: Secondary | ICD-10-CM | POA: Diagnosis not present

## 2020-10-17 DIAGNOSIS — N402 Nodular prostate without lower urinary tract symptoms: Secondary | ICD-10-CM | POA: Diagnosis not present

## 2020-10-17 DIAGNOSIS — G20 Parkinson's disease: Secondary | ICD-10-CM | POA: Diagnosis not present

## 2020-10-17 DIAGNOSIS — G894 Chronic pain syndrome: Secondary | ICD-10-CM | POA: Diagnosis not present

## 2020-10-17 DIAGNOSIS — M6281 Muscle weakness (generalized): Secondary | ICD-10-CM | POA: Diagnosis not present

## 2020-10-17 DIAGNOSIS — I484 Atypical atrial flutter: Secondary | ICD-10-CM | POA: Diagnosis not present

## 2020-10-17 DIAGNOSIS — N390 Urinary tract infection, site not specified: Secondary | ICD-10-CM | POA: Diagnosis not present

## 2020-10-17 DIAGNOSIS — D508 Other iron deficiency anemias: Secondary | ICD-10-CM | POA: Diagnosis not present

## 2020-10-17 DIAGNOSIS — A4151 Sepsis due to Escherichia coli [E. coli]: Secondary | ICD-10-CM | POA: Diagnosis not present

## 2020-10-17 DIAGNOSIS — Z7409 Other reduced mobility: Secondary | ICD-10-CM | POA: Diagnosis not present

## 2020-10-17 NOTE — Patient Outreach (Addendum)
[facility] needs.  Mr. Jason Hartman resides in [facility].   Communication sent to [facility] SW to make aware writer is following for transition plans. Mr. Jason Hartman admitted to admitted on yesterday.   Will continue to follow.   Marthenia Rolling, MSN, RN,BSN [place] Acute Care Coordinator [phone] [facility]) [phone]  (Toll free office)

## 2020-10-18 ENCOUNTER — Ambulatory Visit: Payer: Medicare Other | Admitting: Podiatry

## 2020-10-18 DIAGNOSIS — G4733 Obstructive sleep apnea (adult) (pediatric): Secondary | ICD-10-CM | POA: Diagnosis not present

## 2020-10-18 DIAGNOSIS — A4151 Sepsis due to Escherichia coli [E. coli]: Secondary | ICD-10-CM | POA: Diagnosis not present

## 2020-10-18 DIAGNOSIS — I484 Atypical atrial flutter: Secondary | ICD-10-CM | POA: Diagnosis not present

## 2020-10-18 DIAGNOSIS — I480 Paroxysmal atrial fibrillation: Secondary | ICD-10-CM | POA: Diagnosis not present

## 2020-10-18 DIAGNOSIS — G20 Parkinson's disease: Secondary | ICD-10-CM | POA: Diagnosis not present

## 2020-10-18 DIAGNOSIS — I10 Essential (primary) hypertension: Secondary | ICD-10-CM | POA: Diagnosis not present

## 2020-10-18 DIAGNOSIS — Z7984 Long term (current) use of oral hypoglycemic drugs: Secondary | ICD-10-CM | POA: Diagnosis not present

## 2020-10-18 DIAGNOSIS — N390 Urinary tract infection, site not specified: Secondary | ICD-10-CM | POA: Diagnosis not present

## 2020-10-18 DIAGNOSIS — E1122 Type 2 diabetes mellitus with diabetic chronic kidney disease: Secondary | ICD-10-CM | POA: Diagnosis not present

## 2020-10-18 DIAGNOSIS — I2510 Atherosclerotic heart disease of native coronary artery without angina pectoris: Secondary | ICD-10-CM | POA: Diagnosis not present

## 2020-10-18 DIAGNOSIS — D508 Other iron deficiency anemias: Secondary | ICD-10-CM | POA: Diagnosis not present

## 2020-10-18 DIAGNOSIS — N402 Nodular prostate without lower urinary tract symptoms: Secondary | ICD-10-CM | POA: Diagnosis not present

## 2020-10-19 DIAGNOSIS — Z794 Long term (current) use of insulin: Secondary | ICD-10-CM | POA: Diagnosis not present

## 2020-10-19 DIAGNOSIS — A4151 Sepsis due to Escherichia coli [E. coli]: Secondary | ICD-10-CM | POA: Diagnosis not present

## 2020-10-19 DIAGNOSIS — Z7984 Long term (current) use of oral hypoglycemic drugs: Secondary | ICD-10-CM | POA: Diagnosis not present

## 2020-10-19 DIAGNOSIS — N402 Nodular prostate without lower urinary tract symptoms: Secondary | ICD-10-CM | POA: Diagnosis not present

## 2020-10-19 DIAGNOSIS — I2510 Atherosclerotic heart disease of native coronary artery without angina pectoris: Secondary | ICD-10-CM | POA: Diagnosis not present

## 2020-10-19 DIAGNOSIS — E1122 Type 2 diabetes mellitus with diabetic chronic kidney disease: Secondary | ICD-10-CM | POA: Diagnosis not present

## 2020-10-19 DIAGNOSIS — I10 Essential (primary) hypertension: Secondary | ICD-10-CM | POA: Diagnosis not present

## 2020-10-19 DIAGNOSIS — N390 Urinary tract infection, site not specified: Secondary | ICD-10-CM | POA: Diagnosis not present

## 2020-10-19 DIAGNOSIS — R21 Rash and other nonspecific skin eruption: Secondary | ICD-10-CM | POA: Diagnosis not present

## 2020-10-19 DIAGNOSIS — R319 Hematuria, unspecified: Secondary | ICD-10-CM | POA: Diagnosis not present

## 2020-10-19 DIAGNOSIS — R197 Diarrhea, unspecified: Secondary | ICD-10-CM | POA: Diagnosis not present

## 2020-10-19 DIAGNOSIS — I480 Paroxysmal atrial fibrillation: Secondary | ICD-10-CM | POA: Diagnosis not present

## 2020-10-20 DIAGNOSIS — E782 Mixed hyperlipidemia: Secondary | ICD-10-CM | POA: Diagnosis not present

## 2020-10-20 DIAGNOSIS — I10 Essential (primary) hypertension: Secondary | ICD-10-CM | POA: Diagnosis not present

## 2020-10-20 DIAGNOSIS — Z794 Long term (current) use of insulin: Secondary | ICD-10-CM | POA: Diagnosis not present

## 2020-10-20 DIAGNOSIS — Z7984 Long term (current) use of oral hypoglycemic drugs: Secondary | ICD-10-CM | POA: Diagnosis not present

## 2020-10-20 DIAGNOSIS — I480 Paroxysmal atrial fibrillation: Secondary | ICD-10-CM | POA: Diagnosis not present

## 2020-10-20 DIAGNOSIS — G20 Parkinson's disease: Secondary | ICD-10-CM | POA: Diagnosis not present

## 2020-10-20 DIAGNOSIS — E1122 Type 2 diabetes mellitus with diabetic chronic kidney disease: Secondary | ICD-10-CM | POA: Diagnosis not present

## 2020-10-20 DIAGNOSIS — I2510 Atherosclerotic heart disease of native coronary artery without angina pectoris: Secondary | ICD-10-CM | POA: Diagnosis not present

## 2020-10-20 DIAGNOSIS — N390 Urinary tract infection, site not specified: Secondary | ICD-10-CM | POA: Diagnosis not present

## 2020-10-23 DIAGNOSIS — R197 Diarrhea, unspecified: Secondary | ICD-10-CM | POA: Diagnosis not present

## 2020-10-23 DIAGNOSIS — Z794 Long term (current) use of insulin: Secondary | ICD-10-CM | POA: Diagnosis not present

## 2020-10-23 DIAGNOSIS — E1122 Type 2 diabetes mellitus with diabetic chronic kidney disease: Secondary | ICD-10-CM | POA: Diagnosis not present

## 2020-10-23 DIAGNOSIS — R21 Rash and other nonspecific skin eruption: Secondary | ICD-10-CM | POA: Diagnosis not present

## 2020-10-23 DIAGNOSIS — A4151 Sepsis due to Escherichia coli [E. coli]: Secondary | ICD-10-CM | POA: Diagnosis not present

## 2020-10-23 DIAGNOSIS — I2510 Atherosclerotic heart disease of native coronary artery without angina pectoris: Secondary | ICD-10-CM | POA: Diagnosis not present

## 2020-10-23 DIAGNOSIS — R319 Hematuria, unspecified: Secondary | ICD-10-CM | POA: Diagnosis not present

## 2020-10-23 DIAGNOSIS — I480 Paroxysmal atrial fibrillation: Secondary | ICD-10-CM | POA: Diagnosis not present

## 2020-10-23 DIAGNOSIS — I10 Essential (primary) hypertension: Secondary | ICD-10-CM | POA: Diagnosis not present

## 2020-10-23 DIAGNOSIS — G20 Parkinson's disease: Secondary | ICD-10-CM | POA: Diagnosis not present

## 2020-10-23 DIAGNOSIS — N390 Urinary tract infection, site not specified: Secondary | ICD-10-CM | POA: Diagnosis not present

## 2020-10-23 DIAGNOSIS — Z7984 Long term (current) use of oral hypoglycemic drugs: Secondary | ICD-10-CM | POA: Diagnosis not present

## 2020-10-25 ENCOUNTER — Emergency Department (HOSPITAL_COMMUNITY): Payer: Medicare Other

## 2020-10-25 ENCOUNTER — Encounter (HOSPITAL_COMMUNITY): Payer: Self-pay | Admitting: Emergency Medicine

## 2020-10-25 ENCOUNTER — Inpatient Hospital Stay (HOSPITAL_COMMUNITY)
Admission: EM | Admit: 2020-10-25 | Discharge: 2020-10-30 | DRG: 872 | Disposition: A | Discharge status: Home / Self Care | Payer: Medicare Other | Source: Skilled Nursing Facility | Attending: Internal Medicine | Admitting: Internal Medicine

## 2020-10-25 DIAGNOSIS — G20 Parkinson's disease: Secondary | ICD-10-CM

## 2020-10-25 DIAGNOSIS — Z794 Long term (current) use of insulin: Secondary | ICD-10-CM

## 2020-10-25 DIAGNOSIS — N400 Enlarged prostate without lower urinary tract symptoms: Secondary | ICD-10-CM | POA: Diagnosis not present

## 2020-10-25 DIAGNOSIS — Z79899 Other long term (current) drug therapy: Secondary | ICD-10-CM

## 2020-10-25 DIAGNOSIS — Z885 Allergy status to narcotic agent status: Secondary | ICD-10-CM

## 2020-10-25 DIAGNOSIS — G2581 Restless legs syndrome: Secondary | ICD-10-CM | POA: Diagnosis not present

## 2020-10-25 DIAGNOSIS — C679 Malignant neoplasm of bladder, unspecified: Secondary | ICD-10-CM | POA: Diagnosis present

## 2020-10-25 DIAGNOSIS — I129 Hypertensive chronic kidney disease with stage 1 through stage 4 chronic kidney disease, or unspecified chronic kidney disease: Secondary | ICD-10-CM | POA: Diagnosis present

## 2020-10-25 DIAGNOSIS — N390 Urinary tract infection, site not specified: Secondary | ICD-10-CM | POA: Diagnosis present

## 2020-10-25 DIAGNOSIS — Z7401 Bed confinement status: Secondary | ICD-10-CM

## 2020-10-25 DIAGNOSIS — B961 Klebsiella pneumoniae [K. pneumoniae] as the cause of diseases classified elsewhere: Secondary | ICD-10-CM | POA: Diagnosis present

## 2020-10-25 DIAGNOSIS — E1142 Type 2 diabetes mellitus with diabetic polyneuropathy: Secondary | ICD-10-CM | POA: Diagnosis not present

## 2020-10-25 DIAGNOSIS — Z6830 Body mass index (BMI) 30.0-30.9, adult: Secondary | ICD-10-CM

## 2020-10-25 DIAGNOSIS — R1031 Right lower quadrant pain: Secondary | ICD-10-CM | POA: Diagnosis not present

## 2020-10-25 DIAGNOSIS — R296 Repeated falls: Secondary | ICD-10-CM | POA: Diagnosis present

## 2020-10-25 DIAGNOSIS — G4733 Obstructive sleep apnea (adult) (pediatric): Secondary | ICD-10-CM | POA: Diagnosis not present

## 2020-10-25 DIAGNOSIS — Z9119 Patient's noncompliance with other medical treatment and regimen: Secondary | ICD-10-CM

## 2020-10-25 DIAGNOSIS — N1831 Chronic kidney disease, stage 3a: Secondary | ICD-10-CM

## 2020-10-25 DIAGNOSIS — E1122 Type 2 diabetes mellitus with diabetic chronic kidney disease: Secondary | ICD-10-CM | POA: Diagnosis not present

## 2020-10-25 DIAGNOSIS — C675 Malignant neoplasm of bladder neck: Secondary | ICD-10-CM | POA: Diagnosis not present

## 2020-10-25 DIAGNOSIS — K5730 Diverticulosis of large intestine without perforation or abscess without bleeding: Secondary | ICD-10-CM | POA: Diagnosis not present

## 2020-10-25 DIAGNOSIS — Z7984 Long term (current) use of oral hypoglycemic drugs: Secondary | ICD-10-CM

## 2020-10-25 DIAGNOSIS — N1832 Chronic kidney disease, stage 3b: Secondary | ICD-10-CM | POA: Diagnosis present

## 2020-10-25 DIAGNOSIS — I10 Essential (primary) hypertension: Secondary | ICD-10-CM | POA: Diagnosis not present

## 2020-10-25 DIAGNOSIS — A4159 Other Gram-negative sepsis: Principal | ICD-10-CM | POA: Diagnosis present

## 2020-10-25 DIAGNOSIS — Z7901 Long term (current) use of anticoagulants: Secondary | ICD-10-CM

## 2020-10-25 DIAGNOSIS — K5900 Constipation, unspecified: Secondary | ICD-10-CM | POA: Diagnosis present

## 2020-10-25 DIAGNOSIS — N138 Other obstructive and reflux uropathy: Secondary | ICD-10-CM | POA: Diagnosis not present

## 2020-10-25 DIAGNOSIS — G20C Parkinsonism, unspecified: Secondary | ICD-10-CM | POA: Diagnosis present

## 2020-10-25 DIAGNOSIS — N200 Calculus of kidney: Secondary | ICD-10-CM | POA: Diagnosis present

## 2020-10-25 DIAGNOSIS — R8290 Unspecified abnormal findings in urine: Secondary | ICD-10-CM | POA: Diagnosis not present

## 2020-10-25 DIAGNOSIS — E785 Hyperlipidemia, unspecified: Secondary | ICD-10-CM

## 2020-10-25 DIAGNOSIS — Z888 Allergy status to other drugs, medicaments and biological substances status: Secondary | ICD-10-CM

## 2020-10-25 DIAGNOSIS — K219 Gastro-esophageal reflux disease without esophagitis: Secondary | ICD-10-CM | POA: Diagnosis not present

## 2020-10-25 DIAGNOSIS — A419 Sepsis, unspecified organism: Secondary | ICD-10-CM

## 2020-10-25 DIAGNOSIS — Z20822 Contact with and (suspected) exposure to covid-19: Secondary | ICD-10-CM | POA: Diagnosis not present

## 2020-10-25 DIAGNOSIS — R5381 Other malaise: Secondary | ICD-10-CM | POA: Diagnosis present

## 2020-10-25 DIAGNOSIS — I480 Paroxysmal atrial fibrillation: Secondary | ICD-10-CM | POA: Diagnosis not present

## 2020-10-25 DIAGNOSIS — R6520 Severe sepsis without septic shock: Secondary | ICD-10-CM | POA: Diagnosis not present

## 2020-10-25 DIAGNOSIS — E440 Moderate protein-calorie malnutrition: Secondary | ICD-10-CM | POA: Insufficient documentation

## 2020-10-25 DIAGNOSIS — Z87440 Personal history of urinary (tract) infections: Secondary | ICD-10-CM

## 2020-10-25 DIAGNOSIS — R339 Retention of urine, unspecified: Secondary | ICD-10-CM | POA: Diagnosis not present

## 2020-10-25 DIAGNOSIS — Z91041 Radiographic dye allergy status: Secondary | ICD-10-CM

## 2020-10-25 DIAGNOSIS — G9340 Encephalopathy, unspecified: Secondary | ICD-10-CM

## 2020-10-25 DIAGNOSIS — R319 Hematuria, unspecified: Secondary | ICD-10-CM | POA: Diagnosis not present

## 2020-10-25 DIAGNOSIS — I2510 Atherosclerotic heart disease of native coronary artery without angina pectoris: Secondary | ICD-10-CM | POA: Diagnosis present

## 2020-10-25 DIAGNOSIS — N3090 Cystitis, unspecified without hematuria: Secondary | ICD-10-CM | POA: Diagnosis present

## 2020-10-25 DIAGNOSIS — Z833 Family history of diabetes mellitus: Secondary | ICD-10-CM

## 2020-10-25 DIAGNOSIS — E119 Type 2 diabetes mellitus without complications: Secondary | ICD-10-CM

## 2020-10-25 DIAGNOSIS — N3001 Acute cystitis with hematuria: Principal | ICD-10-CM

## 2020-10-25 DIAGNOSIS — F32A Depression, unspecified: Secondary | ICD-10-CM | POA: Diagnosis present

## 2020-10-25 DIAGNOSIS — Z87891 Personal history of nicotine dependence: Secondary | ICD-10-CM

## 2020-10-25 DIAGNOSIS — Z1612 Extended spectrum beta lactamase (ESBL) resistance: Secondary | ICD-10-CM | POA: Diagnosis present

## 2020-10-25 DIAGNOSIS — N401 Enlarged prostate with lower urinary tract symptoms: Secondary | ICD-10-CM | POA: Diagnosis present

## 2020-10-25 LAB — COMPREHENSIVE METABOLIC PANEL
ALT: 26 U/L (ref 0–44)
AST: 17 U/L (ref 15–41)
Albumin: 3.5 g/dL (ref 3.5–5.0)
Alkaline Phosphatase: 68 U/L (ref 38–126)
Anion gap: 14 (ref 5–15)
BUN: 25 mg/dL — ABNORMAL HIGH (ref 8–23)
CO2: 22 mmol/L (ref 22–32)
Calcium: 9.5 mg/dL (ref 8.9–10.3)
Chloride: 100 mmol/L (ref 98–111)
Creatinine, Ser: 1.21 mg/dL (ref 0.61–1.24)
GFR, Estimated: 60 mL/min — ABNORMAL LOW (ref >60)
Glucose, Bld: 142 mg/dL — ABNORMAL HIGH (ref 70–99)
Potassium: 4.3 mmol/L (ref 3.5–5.1)
Sodium: 136 mmol/L (ref 135–145)
Total Bilirubin: 0.8 mg/dL (ref 0.3–1.2)
Total Protein: 6.7 g/dL (ref 6.5–8.1)

## 2020-10-25 LAB — CBC WITH DIFFERENTIAL/PLATELET
Abs Immature Granulocytes: 0.05 10*3/uL (ref 0.00–0.07)
Basophils Absolute: 0.1 10*3/uL (ref 0.0–0.1)
Basophils Relative: 1 %
Eosinophils Absolute: 0.3 10*3/uL (ref 0.0–0.5)
Eosinophils Relative: 2 %
HCT: 51.1 % (ref 39.0–52.0)
Hemoglobin: 16.7 g/dL (ref 13.0–17.0)
Immature Granulocytes: 0 %
Lymphocytes Relative: 10 %
Lymphs Abs: 1.5 10*3/uL (ref 0.7–4.0)
MCH: 28.9 pg (ref 26.0–34.0)
MCHC: 32.7 g/dL (ref 30.0–36.0)
MCV: 88.4 fL (ref 80.0–100.0)
Monocytes Absolute: 0.8 10*3/uL (ref 0.1–1.0)
Monocytes Relative: 6 %
Neutro Abs: 11.7 10*3/uL — ABNORMAL HIGH (ref 1.7–7.7)
Neutrophils Relative %: 81 %
Platelets: 243 10*3/uL (ref 150–400)
RBC: 5.78 MIL/uL (ref 4.22–5.81)
RDW: 15.3 % (ref 11.5–15.5)
WBC: 14.5 10*3/uL — ABNORMAL HIGH (ref 4.0–10.5)
nRBC: 0 % (ref 0.0–0.2)

## 2020-10-25 LAB — LACTIC ACID, PLASMA
Lactic Acid, Venous: 2.4 mmol/L (ref 0.5–1.9)
Lactic Acid, Venous: 2.9 mmol/L (ref 0.5–1.9)
Lactic Acid, Venous: 1.7 mmol/L (ref 0.5–1.9)

## 2020-10-25 LAB — RESP PANEL BY RT-PCR (FLU A&B, COVID) ARPGX2
Influenza A by PCR: NEGATIVE
Influenza B by PCR: NEGATIVE
SARS Coronavirus 2 by RT PCR: NEGATIVE

## 2020-10-25 LAB — HEMOGLOBIN A1C
Hgb A1c MFr Bld: 7.2 % — ABNORMAL HIGH (ref 4.8–5.6)
Mean Plasma Glucose: 159.94 mg/dL

## 2020-10-25 LAB — PROCALCITONIN: Procalcitonin: 0.1 ng/mL

## 2020-10-25 LAB — CBG MONITORING, ED: Glucose-Capillary: 119 mg/dL — ABNORMAL HIGH (ref 70–99)

## 2020-10-25 IMAGING — CT CT ABD-PELV W/O CM
2 of 4 series · 16 of 46 positions shown, 18 images · IV contrast (APPLIED)
Comparison: Noncontrast CT 2 weeks ago [DATE]

CLINICAL DATA: Flank pain and hematuria. Patient reports bilateral
flank pain. Constipation.

EXAM:
CT ABDOMEN AND PELVIS WITHOUT CONTRAST
TECHNIQUE: Multidetector CT imaging of the abdomen and pelvis was performed
following the standard protocol without IV contrast.

[Series 3: abdomen 5.0 · axial · 0.79mm/px · z∈[+806,+1256]mm · 13 of 102 slices shown, 15 images]
[im 6/102  soft-tissue]
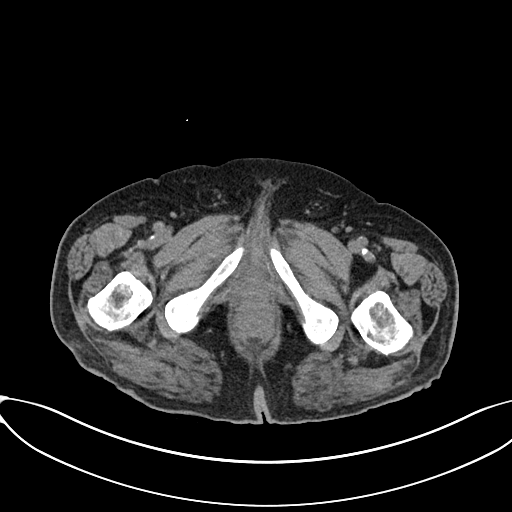
[im 6/102  bone]
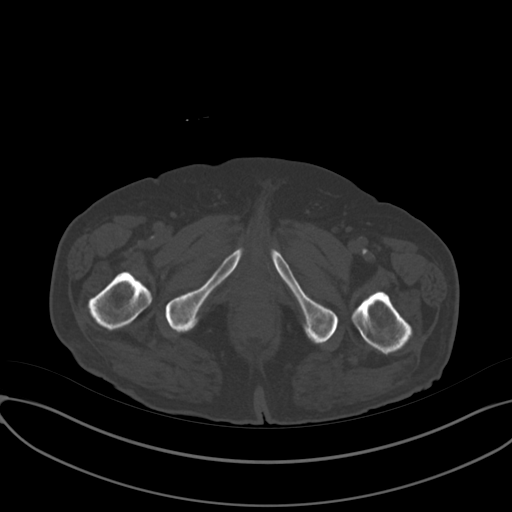
[im 12/102  soft-tissue]
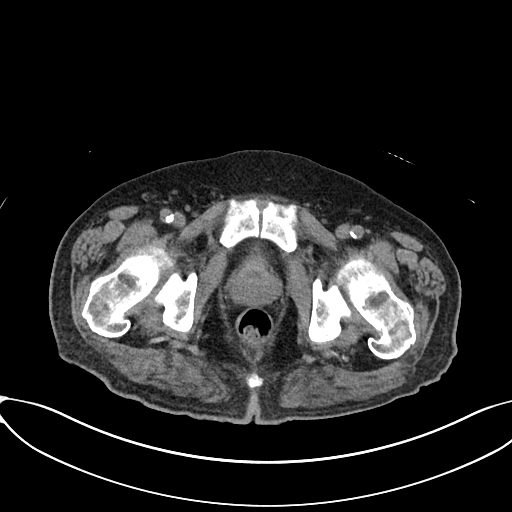
[im 24/102  soft-tissue]
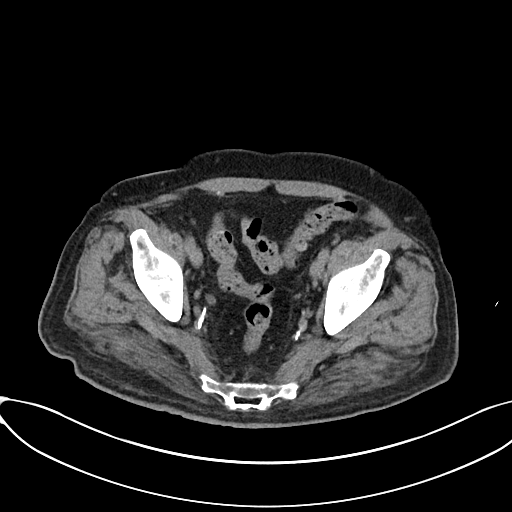
[im 30/102  soft-tissue]
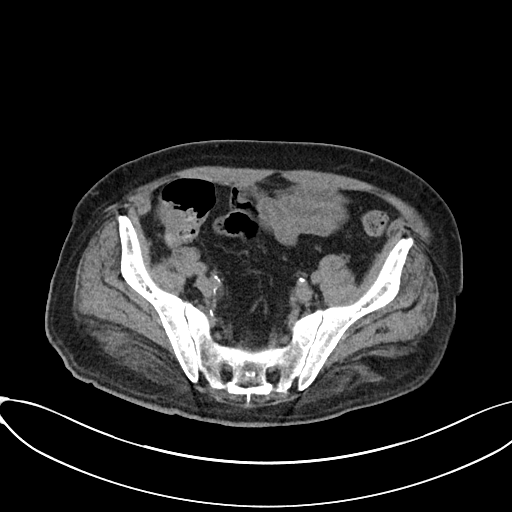
[im 36/102  soft-tissue]
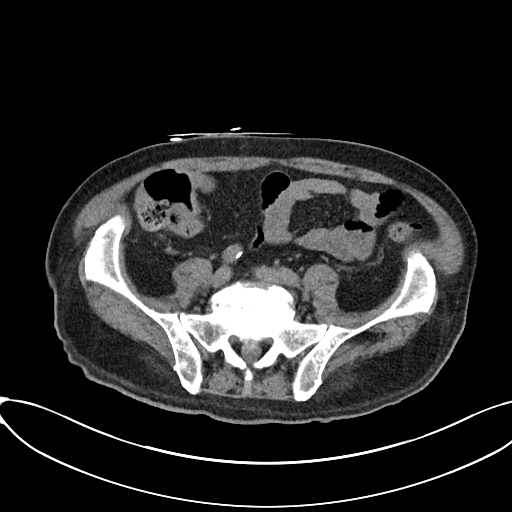
[im 42/102  soft-tissue]
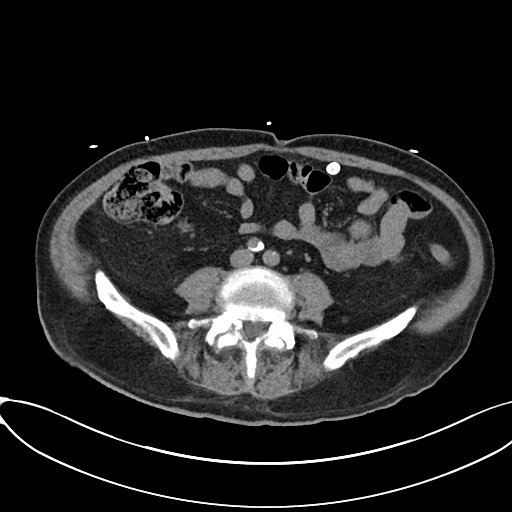
[im 54/102  soft-tissue]
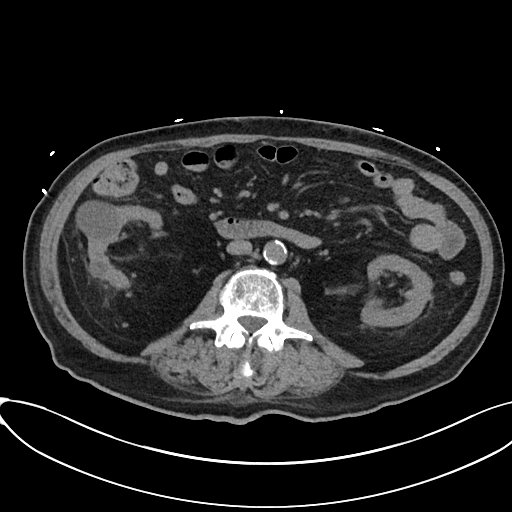
[im 60/102  soft-tissue]
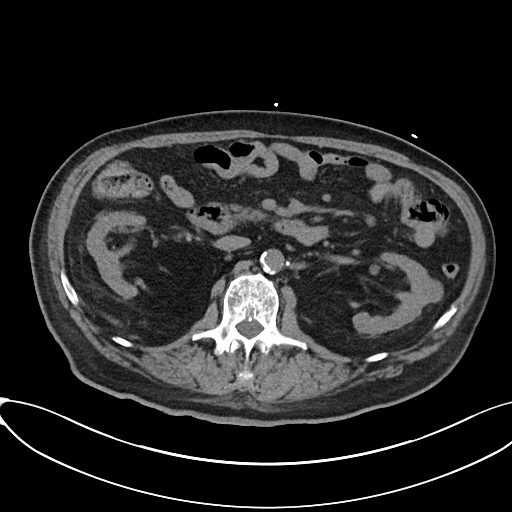
[im 66/102  soft-tissue]
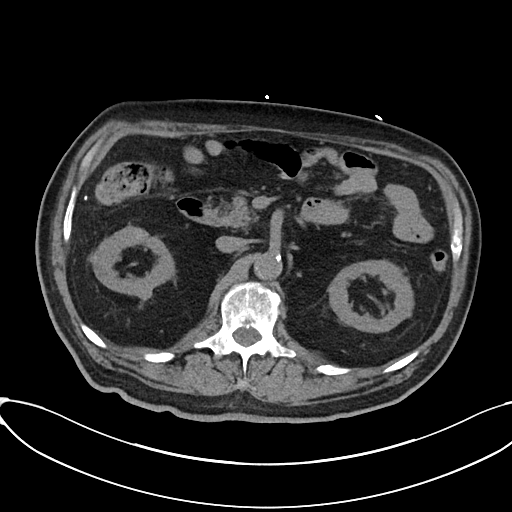
[im 66/102  bone]
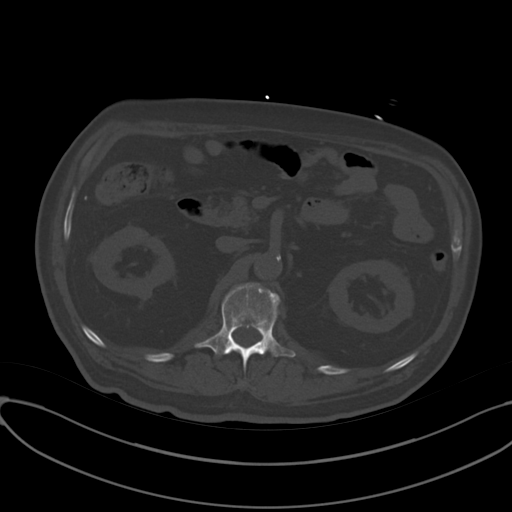
[im 72/102  soft-tissue]
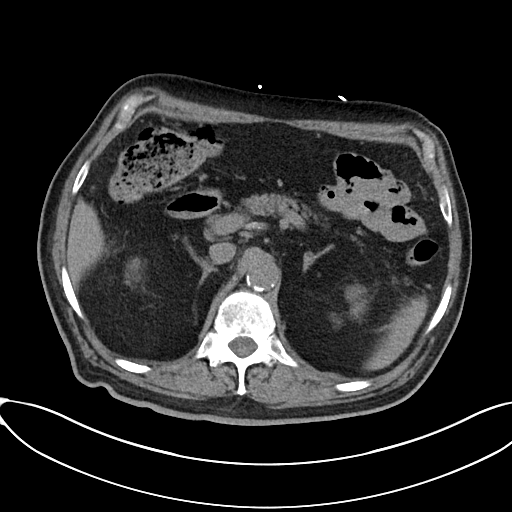
[im 78/102  soft-tissue]
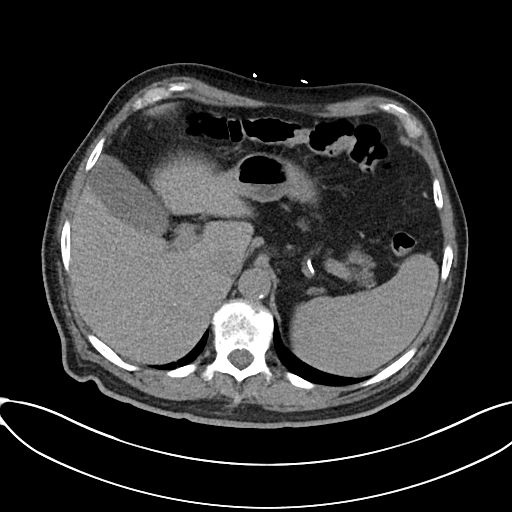
[im 90/102  soft-tissue]
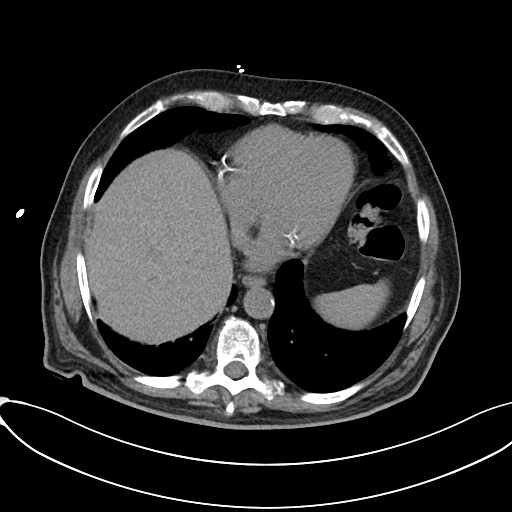
[im 96/102  soft-tissue]
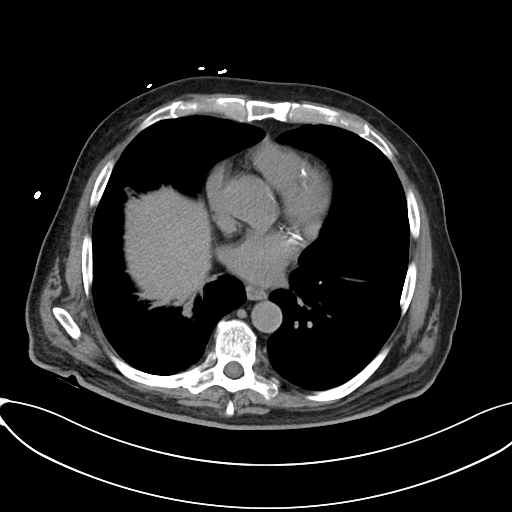

[Series 6: abdomen 3.0 mpr cor · coronal · 0.82mm/px · 3 of 92 slices shown]
[im 31/92  soft-tissue]
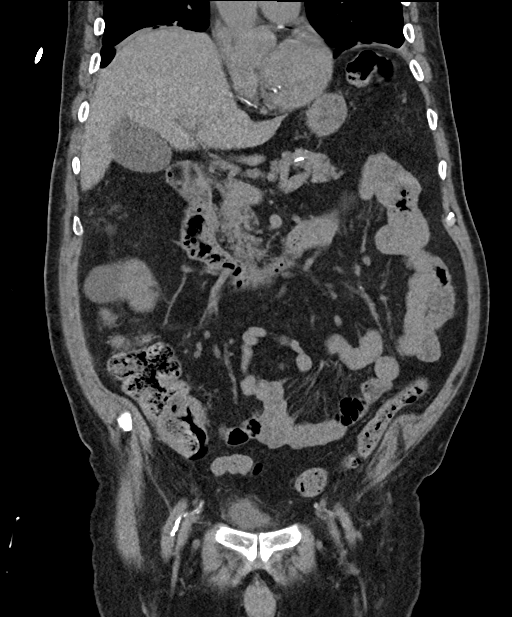
[im 41/92  soft-tissue]
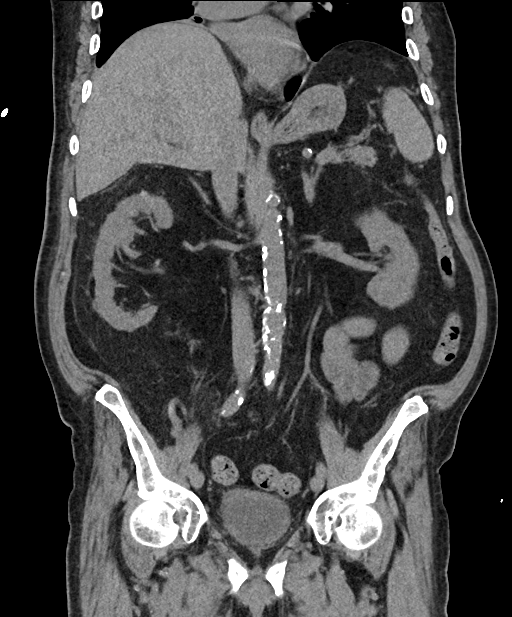
[im 51/92  soft-tissue]
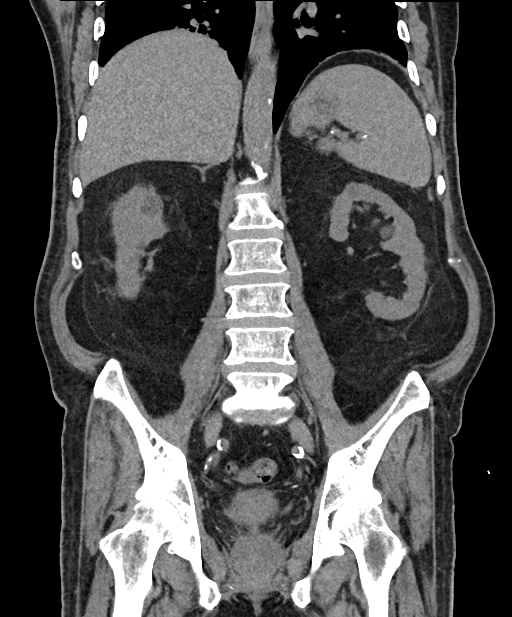

[16 of 46 positions shown; findings below may reference images not displayed]

FINDINGS: Lower chest: Mild compressive atelectasis in the right lower lobe
related to elevated hemidiaphragm. Coronary artery
calcifications/stents. Normal heart size.

Hepatobiliary: No focal hepatic abnormality on this unenhanced exam.
Gallbladder physiologically distended, no calcified stone. No
biliary dilatation.

Pancreas: No ductal dilatation or inflammation.

Spleen: Upper normal in size spanning 13.2 cm. No focal abnormality.

Adrenals/Urinary Tract: No adrenal nodule. There is bilateral renal
parenchymal thinning. No hydronephrosis. There are at least 3 small
right renal calculi in the upper pole, nonobstructing. No left renal
stones. There are bilateral renal cysts. 17 mm likely hyperdense
cyst in the lateral mid left kidney, series 3, image 43, unchanged.
No ureteral stones. Bladder is minimally distended, however mild
perivesicular fat stranding. Mild wall thickening about the
posterior bladder base.

Stomach/Bowel: The stomach is decompressed. There is no small bowel
obstruction or inflammation. Normal appendix. Small to moderate
volume of colonic stool. Left colonic diverticulosis. No
diverticulitis. No abnormal rectal distension. No pericolonic edema.

Vascular/Lymphatic: End-stage aortic atherosclerosis without
aneurysm. No enlarged lymph nodes by size criteria.

Reproductive: Prostate is unremarkable.

Other: No free air or free fluid. Tiny fat containing umbilical
hernia. Calcifications in the anterior omentum likely represent
sequela of prior granulomatous disease.

Musculoskeletal: L5-S1 degenerative disc disease. There are no acute
or suspicious osseous abnormalities.
IMPRESSION: 1. Nonobstructing right nephrolithiasis. No hydronephrosis or
obstructive uropathy.
2. Mild perivesicular fat stranding, can be seen with urinary tract
infection. Mild wall thickening about the posterior bladder base.
3. Colonic diverticulosis without diverticulitis.

Aortic Atherosclerosis ([LL]-[LL]).

## 2020-10-25 MED ORDER — AMIODARONE HCL 200 MG PO TABS
200.0000 mg | ORAL_TABLET | Freq: Every day | ORAL | Status: DC
  Administered 2020-10-26 – 2020-10-30 (×5): 200 mg via ORAL
  Filled 2020-10-25 (×5): qty 1

## 2020-10-25 MED ORDER — SENNA 8.6 MG PO TABS
1.0000 | ORAL_TABLET | Freq: Two times a day (BID) | ORAL | Status: DC
  Administered 2020-10-26 (×2): 8.6 mg via ORAL
  Filled 2020-10-25 (×2): qty 1

## 2020-10-25 MED ORDER — GABAPENTIN 300 MG PO CAPS
300.0000 mg | ORAL_CAPSULE | Freq: Every day | ORAL | Status: DC
  Administered 2020-10-26 – 2020-10-30 (×6): 300 mg via ORAL
  Filled 2020-10-25 (×6): qty 1

## 2020-10-25 MED ORDER — PIPERACILLIN-TAZOBACTAM 3.375 G IVPB
3.3750 g | Freq: Three times a day (TID) | INTRAVENOUS | Status: DC
  Administered 2020-10-25 – 2020-10-29 (×11): 3.375 g via INTRAVENOUS
  Filled 2020-10-25 (×11): qty 50

## 2020-10-25 MED ORDER — TRAMADOL HCL 50 MG PO TABS
50.0000 mg | ORAL_TABLET | Freq: Once | ORAL | Status: AC
  Administered 2020-10-25: 50 mg via ORAL
  Filled 2020-10-25: qty 1

## 2020-10-25 MED ORDER — ACETAMINOPHEN 325 MG PO TABS: 650.0000 mg | ORAL_TABLET | Freq: Four times a day (QID) | ORAL | Status: DC | PRN

## 2020-10-25 MED ORDER — ACETAMINOPHEN 650 MG RE SUPP: 650.0000 mg | Freq: Four times a day (QID) | RECTAL | Status: DC | PRN

## 2020-10-25 MED ORDER — THIAMINE HCL 100 MG PO TABS
100.0000 mg | ORAL_TABLET | Freq: Every day | ORAL | Status: DC
  Administered 2020-10-26 – 2020-10-30 (×5): 100 mg via ORAL
  Filled 2020-10-25 (×5): qty 1

## 2020-10-25 MED ORDER — APIXABAN 5 MG PO TABS
5.0000 mg | ORAL_TABLET | Freq: Two times a day (BID) | ORAL | Status: DC
  Administered 2020-10-26 – 2020-10-30 (×11): 5 mg via ORAL
  Filled 2020-10-25 (×11): qty 1

## 2020-10-25 MED ORDER — METOPROLOL TARTRATE 12.5 MG HALF TABLET
12.5000 mg | ORAL_TABLET | Freq: Two times a day (BID) | ORAL | Status: DC
  Administered 2020-10-26 – 2020-10-30 (×11): 12.5 mg via ORAL
  Filled 2020-10-25 (×11): qty 1

## 2020-10-25 MED ORDER — POLYETHYLENE GLYCOL 3350 17 G PO PACK: 17.0000 g | PACK | Freq: Every day | ORAL | Status: DC | PRN

## 2020-10-25 MED ORDER — SENNOSIDES-DOCUSATE SODIUM 8.6-50 MG PO TABS: 1.0000 | ORAL_TABLET | Freq: Two times a day (BID) | ORAL | Status: DC | PRN

## 2020-10-25 MED ORDER — INSULIN GLARGINE 100 UNIT/ML ~~LOC~~ SOLN
8.0000 [IU] | Freq: Two times a day (BID) | SUBCUTANEOUS | Status: DC
  Administered 2020-10-26 – 2020-10-30 (×11): 8 [IU] via SUBCUTANEOUS
  Filled 2020-10-25 (×12): qty 0.08

## 2020-10-25 MED ORDER — ACETAMINOPHEN 500 MG PO TABS: 1000.0000 mg | ORAL_TABLET | Freq: Four times a day (QID) | ORAL | Status: DC | PRN

## 2020-10-25 MED ORDER — INSULIN ASPART 100 UNIT/ML IJ SOLN
0.0000 [IU] | INTRAMUSCULAR | Status: DC
  Administered 2020-10-26: 2 [IU] via SUBCUTANEOUS
  Administered 2020-10-26 – 2020-10-27 (×2): 1 [IU] via SUBCUTANEOUS
  Administered 2020-10-27 (×2): 2 [IU] via SUBCUTANEOUS
  Administered 2020-10-27 – 2020-10-28 (×2): 5 [IU] via SUBCUTANEOUS
  Administered 2020-10-28: 3 [IU] via SUBCUTANEOUS
  Administered 2020-10-28: 1 [IU] via SUBCUTANEOUS
  Administered 2020-10-29 (×2): 3 [IU] via SUBCUTANEOUS
  Administered 2020-10-29: 2 [IU] via SUBCUTANEOUS
  Administered 2020-10-29: 7 [IU] via SUBCUTANEOUS
  Administered 2020-10-29: 1 [IU] via SUBCUTANEOUS
  Administered 2020-10-30: 5 [IU] via SUBCUTANEOUS
  Administered 2020-10-30 (×2): 3 [IU] via SUBCUTANEOUS
  Administered 2020-10-30: 2 [IU] via SUBCUTANEOUS

## 2020-10-25 MED ORDER — ACETAMINOPHEN 500 MG PO TABS
1000.0000 mg | ORAL_TABLET | Freq: Once | ORAL | Status: AC
  Administered 2020-10-25: 1000 mg via ORAL
  Filled 2020-10-25: qty 2

## 2020-10-25 MED ORDER — AMITRIPTYLINE HCL 10 MG PO TABS
10.0000 mg | ORAL_TABLET | Freq: Every day | ORAL | Status: DC
  Administered 2020-10-26 – 2020-10-30 (×6): 10 mg via ORAL
  Filled 2020-10-25 (×6): qty 1

## 2020-10-25 MED ORDER — ROTIGOTINE 8 MG/24HR TD PT24
1.0000 | MEDICATED_PATCH | Freq: Every evening | TRANSDERMAL | Status: DC
  Administered 2020-10-26 – 2020-10-30 (×5): 1 via TRANSDERMAL
  Filled 2020-10-25 (×3): qty 1

## 2020-10-25 MED ORDER — DOCUSATE SODIUM 100 MG PO CAPS
100.0000 mg | ORAL_CAPSULE | Freq: Two times a day (BID) | ORAL | Status: DC
  Administered 2020-10-26 – 2020-10-27 (×3): 100 mg via ORAL
  Filled 2020-10-25 (×4): qty 1

## 2020-10-25 MED ORDER — KETOROLAC TROMETHAMINE 15 MG/ML IJ SOLN: 15.0000 mg | Freq: Once | INTRAMUSCULAR | Status: DC

## 2020-10-25 MED ORDER — TRAMADOL HCL 50 MG PO TABS
50.0000 mg | ORAL_TABLET | Freq: Every day | ORAL | Status: DC
  Administered 2020-10-26: 50 mg via ORAL
  Filled 2020-10-25: qty 1

## 2020-10-25 MED ORDER — SODIUM CHLORIDE 0.9 % IV BOLUS
1000.0000 mL | Freq: Once | INTRAVENOUS | Status: AC
  Administered 2020-10-25: 1000 mL via INTRAVENOUS

## 2020-10-25 MED ORDER — SODIUM CHLORIDE 0.9 % IV SOLN
75.0000 mL/h | INTRAVENOUS | Status: AC
  Administered 2020-10-26: 75 mL/h via INTRAVENOUS

## 2020-10-25 NOTE — H&P (Signed)
Jason Hartman MVV:[id number] DOB: September 10, 1935 DOA: 10/25/2020      PCP: [name], MD   Outpatient Specialists:   CARDS:  Dr. Janeece Agee   GI [facility]    Urology Dr. Domenick Bookbinder  Patient arrived to ER on 10/25/20 at 1218 Referred by Attending Lucrezia Starch, MD   [facility]: [name]    Chief Complaint:   Chief Complaint  Patient presents with   Recurrent UTI    HPI: Jason Hartman is a 85 y.o. male with medical history significant of DM2, CAD  BPH. Bladder ca, recurrent UTI, A.fib on Eliquis  Presented with was at urology office Coffee colored urine and bilateral flank pain for 2 days He has not had anything to eat or drink all day Last was klebesiella that was resitant needing IV Ertepenem Finished last week PICC Line was taken out 2 days ago No fever no chills reports back pain No CP no ob NO BLEEDING except occasional blood in urine     Original UTi though to be foley induced   Recent hx sig  He have had severe debility Wife thinks it was due to taking to many prescription meds Spend a while at rehab had to be admitted after falls from bed was diagnosed with multipresistant uti    Has   been vaccinated against COVID   and boosted   Initial COVID TEST  in house  PCR testing  Pending  Lab Results  Component Value Date   [place] 10/16/2020   [place] NEGATIVE 10/12/2020   [place] NEGATIVE 09/24/2020   [place] NEGATIVE 09/08/2020     Regarding pertinent Chronic problems:      HTN on metoprolol     CAD  - On  betablocker,                  -  followed by cardiology                 Sp CABG   DM 2 -  Lab Results  Component Value Date   HGBA1C 7.1 (H) 10/12/2020   on insulin,   meds only,      OSA -  noncompliant with CPAP    A. Fib -  - CHA2DS2 vas score 5     current  on anticoagulation with  Eliquis,          -  Rate control:  Currently controlled with metoprolol        - Rhythm  control:   amiodarone,      BPH - not on meds       While in ER: CT abd no hydro Started on Zosyn and ordered urine and blood cult   ED Triage Vitals  Enc Vitals Group     BP 10/25/20 1227 117/67     Pulse Rate 10/25/20 1227 73     Resp 10/25/20 1227 16     Temp 10/25/20 1227 97.7 F (36.5 C)     Temp Source 10/25/20 1227 Oral     SpO2 10/25/20 1227 97 %     Weight --      Height --      Head Circumference --      Peak Flow --      Pain Score 10/25/20 1314 10     Pain Loc --      Pain Edu? --      Excl. in [place]? --   PYYF(11)@  _________________________________________ Significant initial  Findings: Abnormal Labs Reviewed  COMPREHENSIVE METABOLIC PANEL - Abnormal; Notable for the following components:      Result Value   Glucose, Bld 142 (*)    BUN 25 (*)    GFR, Estimated 60 (*)    All other components within normal limits  CBC WITH DIFFERENTIAL/PLATELET - Abnormal; Notable for the following components:   WBC 14.5 (*)    Neutro Abs 11.7 (*)    All other components within normal limits  LACTIC ACID, PLASMA - Abnormal; Notable for the following components:   Lactic Acid, Venous 2.4 (*)    All other components within normal limits  LACTIC ACID, PLASMA - Abnormal; Notable for the following components:   Lactic Acid, Venous 2.9 (*)    All other components within normal limits   ____________________________________________ Ordered     CTabd/pelvis -  1. Nonobstructing right nephrolithiasis. No hydronephrosis or obstructive uropathy.      ECG: Ordered   ____________________ This patient meets SIRS Criteria     The recent clinical data is shown below. Vitals:   10/25/20 1227 10/25/20 1232 10/25/20 1619 10/25/20 1915  BP: 117/67 117/67 136/78 (!) 159/90  Pulse: 73 73 91 (!) 102  Resp: _0 Temp: 97.7 F (36.5 C) 97.7 F (36.5 C) 97.9 F (36.6 C)   TempSrc: Oral Oral Oral   SpO2: 97% 97% 98% 98%    WBC     Component Value Date/Time   WBC  14.5 (H) 10/25/2020 1315   LYMPHSABS 1.5 10/25/2020 1315   LYMPHSABS 1.4 11/12/2016 1129   MONOABS 0.8 10/25/2020 1315   EOSABS 0.3 10/25/2020 1315   EOSABS 0.2 11/12/2016 1129   BASOSABS 0.1 10/25/2020 1315   BASOSABS 0.0 11/12/2016 1129    Lactic Acid, Venous    Component Value Date/Time   LATICACIDVEN 2.9 (HH) 10/25/2020 1515    Procalcitonin  Ordered   Results for orders placed or performed during the hospital encounter of 10/11/20  Urine Culture     Status: Abnormal   Collection Time: 10/11/20  8:16 PM   Specimen: Urine, Random  Result Value Ref Range Status   Specimen Description URINE, RANDOM  Final   Special Requests   Final    NONE Performed at [facility], [address] [place],  [address]    Culture (A)  Final    >=100,000 COLONIES/mL KLEBSIELLA PNEUMONIAE Confirmed Extended Spectrum Beta-Lactamase Producer (ESBL).  In bloodstream infections from ESBL organisms, carbapenems are preferred over piperacillin/tazobactam. They are shown to have a lower risk of mortality.    Report Status 10/14/2020 FINAL  Final   Organism ID, Bacteria KLEBSIELLA PNEUMONIAE (A)  Final      Susceptibility   Klebsiella pneumoniae - MIC*    AMPICILLIN >=32 RESISTANT Resistant     CEFAZOLIN >=64 RESISTANT Resistant     CEFEPIME >=32 RESISTANT Resistant     CEFTRIAXONE >=64 RESISTANT Resistant     CIPROFLOXACIN 2 INTERMEDIATE Intermediate     GENTAMICIN >=16 RESISTANT Resistant     IMIPENEM <=0.25 SENSITIVE Sensitive     NITROFURANTOIN 128 RESISTANT Resistant     TRIMETH/SULFA >=320 RESISTANT Resistant     AMPICILLIN/SULBACTAM >=32 RESISTANT Resistant     PIP/TAZO 16 SENSITIVE Sensitive     * >=100,000 COLONIES/mL KLEBSIELLA PNEUMONIAE  Resp Panel by RT-PCR (Flu A&B, Covid) Nasopharyngeal Swab     Status: None   Collection Time: 10/12/20  1:55 AM   Specimen:  Nasopharyngeal Swab; Nasopharyngeal(NP) swabs in vial transport medium  Result Value Ref Range Status                Influenza A by PCR NEGATIVE NEGATIVE Final   Influenza B by PCR NEGATIVE NEGATIVE Final        Resp Panel by RT-PCR (Flu A&B, Covid) Nasopharyngeal Swab     Status: None            _______________________________________________ Hospitalist was called for admission for SEPSIS due to uti  The following Work up has been ordered so far:  Orders Placed This Encounter  Procedures   Urine Culture   Blood culture (routine x 2)   CT ABDOMEN PELVIS WO CONTRAST   Comprehensive metabolic panel   CBC with Differential   Urinalysis, Routine w reflex microscopic   Lactic acid, plasma   piperacillin-tazobactam (ZOSYN) per pharmacy consult   Consult to hospitalist  Complicated UTI requiring IV abx      Following Medications were ordered in ER: Medications  piperacillin-tazobactam (ZOSYN) IVPB 3.375 g (3.375 g Intravenous New Bag/Given 10/25/20 2141)  sodium chloride 0.9 % bolus 1,000 mL (1,000 mLs Intravenous New Bag/Given 10/25/20 2141)  traMADol (ULTRAM) tablet 50 mg (50 mg Oral Given 10/25/20 2003)  acetaminophen (TYLENOL) tablet 1,000 mg (1,000 mg Oral Given 10/25/20 2003)        Consult Orders  (From admission, onward)           Start     Ordered   10/25/20 2124  Consult to hospitalist  Complicated UTI requiring IV abx Paged by Jerene Pitch  Once       Comments: Complicated UTI requiring IV abx  Provider:  (Not yet assigned)  Question Answer Comment  Place call to: [facility]   Reason for Consult Admit      10/25/20 2123              OTHER Significant initial  Findings:  labs showing:  Recent Labs  Lab 10/25/20 1315  NA 136  K 4.3  CO2 22  GLUCOSE 142*  BUN 25*  CREATININE 1.21  CALCIUM 9.5    Cr   stable,    Lab Results  Component Value Date   CREATININE 1.21 10/25/2020   CREATININE 1.01 10/15/2020   CREATININE 0.96 10/14/2020    Recent Labs  Lab 10/25/20 1315  AST 17  ALT 26  ALKPHOS 68  BILITOT 0.8  PROT 6.7  ALBUMIN 3.5   Lab  Results  Component Value Date   CALCIUM 9.5 10/25/2020   PHOS 3.7 10/12/2020       Plt: Lab Results  Component Value Date   PLT 243 10/25/2020       Recent Labs  Lab 10/25/20 1315  WBC 14.5*  NEUTROABS 11.7*  HGB 16.7  HCT 51.1  MCV 88.4  PLT 243    HG/HCT   stable,      Component Value Date/Time   HGB 16.7 10/25/2020 1315   HGB 13.2 11/12/2016 1129   HCT 51.1 10/25/2020 1315   HCT 41.9 11/12/2016 1129   MCV 88.4 10/25/2020 1315   MCV 83 11/12/2016 1129        BNP (last 3 results) Recent Labs    09/10/20 1108  BNP 95.6      DM  labs:  HbA1C: Recent Labs    10/12/20 0332  HGBA1C 7.1*       CBG (last 3)  No results for input(s): GLUCAP in the  last 72 hours.        Cultures:    Component Value Date/Time   SDES URINE, RANDOM 10/11/2020 2016   SPECREQUEST  10/11/2020 2016    NONE Performed at [facility] [address] [place], [place] [address]    CULT (A) 10/11/2020 2016    >=100,000 COLONIES/mL KLEBSIELLA PNEUMONIAE Confirmed Extended Spectrum Beta-Lactamase Producer (ESBL).  In bloodstream infections from ESBL organisms, carbapenems are preferred over piperacillin/tazobactam. They are shown to have a lower risk of mortality.    REPTSTATUS 10/14/2020 FINAL 10/11/2020 2016     Radiological Exams on Admission: CT ABDOMEN PELVIS WO CONTRAST  Result Date: 10/25/2020 CLINICAL DATA:  Flank pain and hematuria. Patient reports bilateral flank pain. Constipation. EXAM: CT ABDOMEN AND PELVIS WITHOUT CONTRAST TECHNIQUE: Multidetector CT imaging of the abdomen and pelvis was performed following the standard protocol without IV contrast. COMPARISON:  Noncontrast CT 2 weeks ago 10/12/2020 FINDINGS: Lower chest: Mild compressive atelectasis in the right lower lobe related to elevated hemidiaphragm. Coronary artery calcifications/stents. Normal heart size. Hepatobiliary: No focal hepatic abnormality on this unenhanced exam. Gallbladder physiologically  distended, no calcified stone. No biliary dilatation. Pancreas: No ductal dilatation or inflammation. Spleen: Upper normal in size spanning 13.2 cm. No focal abnormality. Adrenals/Urinary Tract: No adrenal nodule. There is bilateral renal parenchymal thinning. No hydronephrosis. There are at least 3 small right renal calculi in the upper pole, nonobstructing. No left renal stones. There are bilateral renal cysts. 17 mm likely hyperdense cyst in the lateral mid left kidney, series 3, image 43, unchanged. No ureteral stones. Bladder is minimally distended, however mild perivesicular fat stranding. Mild wall thickening about the posterior bladder base. Stomach/Bowel: The stomach is decompressed. There is no small bowel obstruction or inflammation. Normal appendix. Small to moderate volume of colonic stool. Left colonic diverticulosis. No diverticulitis. No abnormal rectal distension. No pericolonic edema. Vascular/Lymphatic: End-stage aortic atherosclerosis without aneurysm. No enlarged lymph nodes by size criteria. Reproductive: Prostate is unremarkable. Other: No free air or free fluid. Tiny fat containing umbilical hernia. Calcifications in the anterior omentum likely represent sequela of prior granulomatous disease. Musculoskeletal: L5-S1 degenerative disc disease. There are no acute or suspicious osseous abnormalities. IMPRESSION: 1. Nonobstructing right nephrolithiasis. No hydronephrosis or obstructive uropathy. 2. Mild perivesicular fat stranding, can be seen with urinary tract infection. Mild wall thickening about the posterior bladder base. 3. Colonic diverticulosis without diverticulitis. Aortic Atherosclerosis (ICD10-I70.0). Electronically Signed   By: Keith Rake M.D.   On: 10/25/2020 21:18   _______________________________________________________________________________________________________ Latest  Blood pressure (!) 159/90, pulse (!) 102, temperature 97.9 F (36.6 C), temperature source Oral,  resp. rate 17, SpO2 98 %.   Review of Systems:    Pertinent positives include: fatigue,  Constitutional:  No weight loss, night sweats, Fevers, chills,  weight loss  HEENT:  No headaches, Difficulty swallowing,Tooth/dental problems,Sore throat,  No sneezing, itching, ear ache, nasal congestion, post nasal drip,  Cardio-vascular:  No chest pain, Orthopnea, PND, anasarca, dizziness, palpitations.no Bilateral lower extremity swelling  GI:  No heartburn, indigestion, abdominal pain, nausea, vomiting, diarrhea, change in bowel habits, loss of appetite, melena, blood in stool, hematemesis Resp:  no shortness of breath at rest. No dyspnea on exertion, No excess mucus, no productive cough, No non-productive cough, No coughing up of blood.No change in color of mucus.No wheezing. Skin:  no rash or lesions. No jaundice GU:  no dysuria, change in color of urine, no urgency or frequency. No straining to urinate.  No flank pain.  Musculoskeletal:  No joint pain or no joint swelling. No decreased range of motion. No back pain.  Psych:  No change in mood or affect. No depression or anxiety. No memory loss.  Neuro: no localizing neurological complaints, no tingling, no weakness, no double vision, no gait abnormality, no slurred speech, no confusion  All systems reviewed and apart from [place] all are negative _______________________________________________________________________________________________ Past Medical History:   Past Medical History:  Diagnosis Date   Cellulitis and abscess of leg, except foot    Hypertension    Obstructive sleep apnea (adult) (pediatric)    Pain in joint, pelvic region and thigh    Pneumonia, organism unspecified(486)    Restless legs syndrome (RLS)    RLS (restless legs syndrome) 09/01/2012   Thoracic or lumbosacral neuritis or radiculitis, unspecified    Type II or unspecified type diabetes mellitus without mention of complication, not stated as uncontrolled     Unspecified disease of pericardium    Unspecified hereditary and idiopathic peripheral neuropathy     Past Surgical History:  Procedure Laterality Date   ANAL FISSURE REPAIR     pericarditis  2009    Social History:  Ambulatory   bed bound      reports that he quit smoking about 40 years ago. He has never used smokeless tobacco. He reports that he does not drink alcohol and does not use drugs.     Family History:   Family History  Problem Relation Age of Onset   Diabetes Father    ______________________________________________________________________________________________ Allergies: Allergies  Allergen Reactions   Oxycodone Anaphylaxis    Other reaction(s): Other (See Comments) Cannot recall specifics   Iodinated Diagnostic Agents Hives    alleric to renografin,isovue & omnipaque, hives, requires 13 hr prep//a.calhoun, Onset Date: [id number]      Iodine Hives and Rash    alleric to renografin,isovue & omnipaque, hives, requires 13 hr prep//a.calhoun, Onset Date: [id number] allergic to renografin,isovue & omnipaque, hives, requires 13 hr prep//a.calhoun, Onset Date: [id number]   Linezolid Hives and Rash        Methadone Hcl Other (See Comments)    HALLUCINATIONS    Promethazine Other (See Comments)    Mental status change, DELIRIUM   Other reaction(s): erratic behavior Other reaction(s): Other (See Comments) Mental status change DELIRIUM (pulled out IV)   Isovue [Iopamidol]    Morphine Sulfate Other (See Comments)    Pt not sure about allergy    Omnipaque [Iohexol] Hives    Code: HIVES, Desc: alleric to renografin,isovue & omnipaque, hives, requires 13 hr prep//a.calhoun, Onset Date: [id number]    Other Hives    Other reaction(s): erratic behanvior Other reaction(s): erratic behavior   Oxycodone Hcl Other (See Comments)    Pt not sure about allergy    Quetiapine Other (See Comments)    Pt not sure about allergy  Other reaction(s): Other (See Comments) Pt  not sure about allergy  Pt not sure about allergy    Renografin [Diatrizoate]    Statins     Other reaction(s): muscle weakness Other reaction(s): muscle weakness   Zolpidem Other (See Comments)    Other reaction(s): erratic behanvior Other reaction(s): Delusions (intolerance) Altered mental status   Atorvastatin Itching and Other (See Comments)    Tired, weakness    Carbidopa-Levodopa Anxiety   Chlorhexidine Gluconate [Chlorhexidine] Hives and Rash   Clindamycin Rash   Colesevelam Other (See Comments)    tired    Doxazosin Rash   Lovastatin Other (See Comments)  Tired, nervousness    Rosuvastatin Other (See Comments)    Tired, weakness      Prior to Admission medications   Medication Sig Start Date End Date Taking? Authorizing Provider  acetaminophen (TYLENOL) 500 MG tablet Take 500 mg by mouth 3 (three) times daily.    [provider]  amiodarone (PACERONE) 200 MG tablet Take 1 tablet (200 mg total) by mouth daily. 09/26/20   Mercy Riding, MD  amitriptyline (ELAVIL) 10 MG tablet Take 10 mg by mouth at bedtime.    [provider]  apixaban (ELIQUIS) 5 MG TABS tablet Take 1 tablet (5 mg total) by mouth 2 (two) times daily. 09/29/20 10/29/20  Mercy Riding, MD  BD PEN NEEDLE NANO 2ND GEN 32G X 4 MM MISC FOR LANTUS AND VICTOZA PENS 11/30/19   [provider]  DULoxetine (CYMBALTA) 30 MG capsule Take 30 mg by mouth daily.    [provider]  Ensure (ENSURE) Take 237 mLs by mouth in the morning and at bedtime.    [provider]  ertapenem (INVANZ) IVPB Inject 1 g into the vein daily. Indication:  ESBL Kleb pneumo UTI First Dose: Yes Last Day of Therapy:  10/20/20 Labs - Once weekly:  CBC/D and BMP, Labs - Every other week:  ESR and CRP Method of administration: Mini-Bag Plus / Gravity Method of administration may be changed at the discretion of home infusion pharmacist based upon assessment of the patient and/or caregiver's ability  to self-administer the medication ordered. 10/17/20   Danford, Suann Larry, MD  ferrous sulfate 325 (65 FE) MG EC tablet Take 325 mg by mouth daily.  05/28/18   [provider]  fluticasone (FLONASE) 50 MCG/ACT nasal spray Place 2 sprays into both nostrils daily. 06/21/20   [provider]  gabapentin (NEURONTIN) 300 MG capsule Take 1 capsule (300 mg total) by mouth at bedtime. 09/25/20   Mercy Riding, MD  LANTUS SOLOSTAR 100 UNIT/ML Solostar Pen Inject 8 Units into the skin 2 (two) times daily. 09/25/20   Mercy Riding, MD  metFORMIN (GLUCOPHAGE-XR) 500 MG 24 hr tablet Take 500 mg by mouth daily with breakfast. 01/17/20   [provider]  metoprolol tartrate (LOPRESSOR) 25 MG tablet Take 0.5 tablets (12.5 mg total) by mouth 2 (two) times daily. 09/25/20   Mercy Riding, MD  polyethylene glycol powder (GLYCOLAX/MIRALAX) 17 GM/SCOOP powder Take 17 g by mouth daily.    [provider]  Rotigotine (NEUPRO) 8 MG/24HR PT24 Place 1 patch onto the skin every evening.    [provider]  Semaglutide,0.25 or 0.5MG/DOS, (OZEMPIC, 0.25 OR 0.5 MG/DOSE,) 2 MG/1.5ML SOPN Inject 0.5 mg into the skin every Tuesday.    [provider]  senna-docusate (SENOKOT-S) 8.6-50 MG tablet Take 1 tablet by mouth 2 (two) times daily as needed for moderate constipation. 09/25/20   Mercy Riding, MD  tadalafil (CIALIS) 5 MG tablet Take 5 mg by mouth daily. 04/13/20   [provider]  thiamine 100 MG tablet Take 1 tablet (100 mg total) by mouth daily. 09/26/20   Mercy Riding, MD  traMADol (ULTRAM) 50 MG tablet Take 1 tablet (50 mg total) by mouth daily. 10/16/20   Edwin Dada, MD    ___________________________________________________________________________________________________ Physical Exam: Vitals with BMI 10/25/2020 10/25/2020 10/25/2020  Height - - -  Weight - - -  BMI - - -  Systolic [phone]  Diastolic 90 78 67  Pulse 297 91  73     1. General:  in  No  Acute distress    Chronically ill  -appearing 2. Psychological: Alert and   Oriented 3. Head/ENT: Dry Mucous Membranes                          Head Non traumatic, neck supple                           Poor Dentition 4. SKIN:  decreased Skin turgor,  Skin clean Dry and intact no rash, sacral ulcer 5. Heart: Regular rate and rhythm no  Murmur, no Rub or gallop 6. Lungs:   no wheezes or crackles   7. Abdomen: Soft, non-tender, Non distended   8. Lower extremities: no clubbing, cyanosis, no  edema 9. Neurologically Grossly intact, moving all 4 extremities equally   10. MSK: Normal range of motion    Chart has been reviewed  ______________________________________________________________________________________________  Assessment/Plan 85 y.o. male with medical history significant of DM2, CAD  BPH. Bladder ca, recurrent UTI, A.fib on Eliquis   Admitted for UTI, debility  Present on Admission:  UTI (urinary tract infection) - broad spectrum ABX zosyn and waiting for urine culture   Sepsis ([place]) -  -SIRS criteria met with elevated white blood cell count,       Component Value Date/Time   WBC 14.5 (H) 10/25/2020 1315   LYMPHSABS 1.5 10/25/2020 1315   LYMPHSABS 1.4 11/12/2016 1129   tachycardia   , RR >20 Today's Vitals   10/25/20 2200 10/25/20 2206 10/25/20 2215 10/25/20 2230  BP: 122/67  (!) 142/73 (!) 145/73  Pulse: 84  83 83  Resp: _0 Temp:      TempSrc:      SpO2: 98%  (!) 85% 90%  PainSc:  0-No pain     -Most likely source being: urinary Patient meeting criteria for Severe sepsis with    elevated lactic acid >2   - Obtain serial lactic acid and procalcitonin level.  - Initiated IV antibiotics   - await results of blood and urine culture  - Rehydrate aggressively         10:47 PM   RESTLESS LEG SYNDROME continue neupro patch   Parkinsonism (HCC) -chronic stable   PAF (paroxysmal atrial fibrillation) ([place]) -continue metoprolol and Eliquis and  amiodarone current appears to be stable   OBSTRUCTIVE SLEEP APNEA -noncompliant with CPAP   Essential hypertension -continue metoprolol   Hyperlipidemia -not taking any statin  Constipation treat as needed bowel regimen.  Patient reported recently he actually had some diarrhea after taking too much MiraLAX would avoid overuse   Diabetic peripheral neuropathy (HCC) continue Neurontin  DM2-  - Order Sensitive  SSI   - continue home insulin regimen    -  check TSH and HgA1C  - Hold by mouth medications   Debility - PT/OT eval  [place] ulcer wound care consult  Other plan as per orders.  DVT prophylaxis:  eliquis     Code Status:    Code Status: Prior FULL CODE   as per patient   I had personally discussed CODE STATUS with patient and family     Family Communication:   Family  at  Bedside  plan of care was discussed   with  Wife   Disposition Plan:  Back to current facility when stable       Following barriers for discharge:                                                         Pain controlled with PO medications                                 white count improving able to transition to PO antibiotics                             Will need to be able to tolerate PO                           discharge    Would benefit from PT/OT eval prior to DC  Ordered                   Swallow eval - SLP ordered                   Diabetes care coordinator                   Transition of care consulted                   Nutrition    consulted                  Wound care  consulted                                       Consults called: none  Admission status:  ED Disposition     ED Disposition  [place]: [facility] [[id number]]  Level of Care: Telemetry Medical [[id number]]  May place patient in observation at [facility] or [place] if equivalent level of care is available:: No  Covid Evaluation:  Asymptomatic Screening Protocol (No Symptoms)  Diagnosis: UTI (urinary tract infection) [970263]  Admitting Physician: Toy Baker [[id number]]  Attending Physician: Toy Baker [[id number]]           Obs     Level of care     tele  For 12H      Lab Results  Component Value Date   [place] 10/16/2020     Precautions: admitted as  asymptomatic screening protocol    PPE: Used by the provider:   N95  eye Goggles,  Gloves     Veronia Laprise 10/25/2020, 10:51 PM    [facility]     after 2 AM please page floor coverage PA If 7AM-7PM, please contact the day team taking care of the patient using [email or web address]   Patient was evaluated in the context of the global COVID-19 pandemic, which necessitated consideration that the patient might be at risk for infection with the SARS-CoV-2 virus that causes COVID-19. Institutional protocols and algorithms that pertain to the evaluation of patients at risk for COVID-19 are in a state of rapid change based on information released by regulatory bodies  including the CDC and federal and state organizations. These policies and algorithms were followed during the patient's care.

## 2020-10-25 NOTE — ED Notes (Addendum)
Pt is peeing blood I was unable to get sample

## 2020-10-25 NOTE — ED Provider Notes (Signed)
[facility] EMERGENCY DEPARTMENT Provider Note   CSN: [id number] Arrival date & time: 10/25/20  1218     History Chief Complaint  Patient presents with   Recurrent UTI    FILBERT CRAZE is a 85 y.o. male with a Pmhx of Afib, a hx of urethral cancer, and UTIs presenting to the ED from his urologists office for a UTI. His last culture grew out Klebsiella and was resistant to almost all po antibiotics, was sensitive to imipenem and pip/tazo. He has had multiple admissions for urinary retention and UTIs and has had multiple courses of antibiotics. Last admission was a couple weeks ago and he was discharged home with a PICC line and IV vancomycin. Patient states that this morning he noticed that he had coffee colored urine and has an increase in urinary frequency. He denies any dysuria but was told to come in by his urologist. Patient also complaining of bilateral flank pain that is a 10/10 in severity. He also states he is constipated and has abd pain. His last BM was 2 days ago.   The history is provided by the patient and the spouse.      Past Medical History:  Diagnosis Date   Cellulitis and abscess of leg, except foot    Hypertension    Obstructive sleep apnea (adult) (pediatric)    Pain in joint, pelvic region and thigh    Pneumonia, organism unspecified(486)    Restless legs syndrome (RLS)    RLS (restless legs syndrome) 09/01/2012   Thoracic or lumbosacral neuritis or radiculitis, unspecified    Type II or unspecified type diabetes mellitus without mention of complication, not stated as uncontrolled    Unspecified disease of pericardium    Unspecified hereditary and idiopathic peripheral neuropathy     Patient Active Problem List   Diagnosis Date Noted   Sepsis ([place]) 10/12/2020   AMS (altered mental status)    PAF (paroxysmal atrial fibrillation) ([place])    Acute metabolic encephalopathy 29/47/6546   Anxiety 09/09/2020   SIRS (systemic inflammatory response  syndrome) ([place]) 09/09/2020   Diaphoresis    Slow transit constipation 08/07/2020   Abdominal aortic aneurysm without rupture ([place]) 03/17/2020   Abnormal gait 03/17/2020   Ascending aorta dilatation ([place]) 03/17/2020   Diabetic renal disease ([place]) 03/17/2020   Recurrent UTI 07/16/2019   Hypertensive urgency 04/26/2019   Malignant HTN with heart disease, w/o CHF, w/o chronic kidney disease 04/26/2019   Elevated troponin    LFTs abnormal    Statin intolerance 01/15/2019   Penis pain 04/10/2018   Urethra cancer ([place]) 02/25/2018   Lower extremity edema 06/09/2017   Peptic ulcer disease 05/16/2017   GERD (gastroesophageal reflux disease) 05/16/2017   Chronic kidney disease, stage 3a ([place]) 04/11/2017   Lower urinary tract symptoms (LUTS) 04/15/2016   Narcotic dependence ([place]) 08/31/2015   Imbalance 08/31/2015   Diabetic peripheral neuropathy ([place]) 08/31/2015   Other malaise and fatigue 05/02/2015   Chronic fatigue 05/02/2015   Panic disorder without agoraphobia 03/22/2015   Hyperlipidemia 02/03/2014   Thrombocytopenia ([place]) 08/18/2013   Parkinsonism ([place]) 08/18/2013   Other disorders of lung 08/18/2013   Organic impotence 08/18/2013   Memory loss 08/18/2013   Low back pain 08/18/2013   Iron deficiency anemia 08/18/2013   Hypercalcemia 08/18/2013   Cellulitis of right leg 08/18/2013   Atherosclerotic heart disease of native coronary artery without angina pectoris 08/18/2013   RLS (restless legs syndrome) 09/01/2012   HERPES SIMPLEX INFECTION 11/06/2006  Type II diabetes mellitus ([place]) 11/06/2006   HYPOGONADISM 11/06/2006   VITAMIN D DEFICIENCY 11/06/2006   ANXIETY 11/06/2006   OBSTRUCTIVE SLEEP APNEA 11/06/2006   RESTLESS LEG SYNDROME 11/06/2006   HYPERTENSION 11/06/2006   BENIGN PROSTATIC HYPERTROPHY 11/06/2006   ROSACEA 11/06/2006   OSTEOARTHRITIS 11/06/2006   INSOMNIA 11/06/2006   HYPERGLYCEMIA 11/06/2006   COLONIC POLYPS, HX OF 11/06/2006    Past Surgical History:   Procedure Laterality Date   ANAL FISSURE REPAIR     pericarditis  2009       Family History  Problem Relation Age of Onset   Diabetes Father     Social History   Tobacco Use   Smoking status: Former    Types: Cigarettes    Quit date: 09/01/1980    Years since quitting: 40.1   Smokeless tobacco: Never  Vaping Use   Vaping Use: Never used  Substance Use Topics   Alcohol use: No   Drug use: No    Home Medications Prior to Admission medications   Medication Sig Start Date End Date Taking? Authorizing Provider  acetaminophen (TYLENOL) 500 MG tablet Take 500 mg by mouth 3 (three) times daily.    [provider]  amiodarone (PACERONE) 200 MG tablet Take 1 tablet (200 mg total) by mouth daily. 09/26/20   Mercy Riding, MD  amitriptyline (ELAVIL) 10 MG tablet Take 10 mg by mouth at bedtime.    [provider]  apixaban (ELIQUIS) 5 MG TABS tablet Take 1 tablet (5 mg total) by mouth 2 (two) times daily. 09/29/20 10/29/20  Mercy Riding, MD  BD PEN NEEDLE NANO 2ND GEN 32G X 4 MM MISC FOR LANTUS AND VICTOZA PENS 11/30/19   [provider]  DULoxetine (CYMBALTA) 30 MG capsule Take 30 mg by mouth daily.    [provider]  Ensure (ENSURE) Take 237 mLs by mouth in the morning and at bedtime.    [provider]  ertapenem (INVANZ) IVPB Inject 1 g into the vein daily. Indication:  ESBL Kleb pneumo UTI First Dose: Yes Last Day of Therapy:  10/20/20 Labs - Once weekly:  CBC/D and BMP, Labs - Every other week:  ESR and CRP Method of administration: Mini-Bag Plus / Gravity Method of administration may be changed at the discretion of home infusion pharmacist based upon assessment of the patient and/or caregiver's ability to self-administer the medication ordered. 10/17/20   Danford, Suann Larry, MD  ferrous sulfate 325 (65 FE) MG EC tablet Take 325 mg by mouth daily.  05/28/18   [provider]  fluticasone (FLONASE) 50 MCG/ACT nasal spray Place  2 sprays into both nostrils daily. 06/21/20   [provider]  gabapentin (NEURONTIN) 300 MG capsule Take 1 capsule (300 mg total) by mouth at bedtime. 09/25/20   Mercy Riding, MD  LANTUS SOLOSTAR 100 UNIT/ML Solostar Pen Inject 8 Units into the skin 2 (two) times daily. 09/25/20   Mercy Riding, MD  metFORMIN (GLUCOPHAGE-XR) 500 MG 24 hr tablet Take 500 mg by mouth daily with breakfast. 01/17/20   [provider]  metoprolol tartrate (LOPRESSOR) 25 MG tablet Take 0.5 tablets (12.5 mg total) by mouth 2 (two) times daily. 09/25/20   Mercy Riding, MD  polyethylene glycol powder (GLYCOLAX/MIRALAX) 17 GM/SCOOP powder Take 17 g by mouth daily.    [provider]  Rotigotine (NEUPRO) 8 MG/24HR PT24 Place 1 patch onto the skin every evening.    [provider]  Semaglutide,0.25  or 0.5MG/DOS, (OZEMPIC, 0.25 OR 0.5 MG/DOSE,) 2 MG/1.5ML SOPN Inject 0.5 mg into the skin every Tuesday.    [provider]  senna-docusate (SENOKOT-S) 8.6-50 MG tablet Take 1 tablet by mouth 2 (two) times daily as needed for moderate constipation. 09/25/20   Mercy Riding, MD  tadalafil (CIALIS) 5 MG tablet Take 5 mg by mouth daily. 04/13/20   [provider]  thiamine 100 MG tablet Take 1 tablet (100 mg total) by mouth daily. 09/26/20   Mercy Riding, MD  traMADol (ULTRAM) 50 MG tablet Take 1 tablet (50 mg total) by mouth daily. 10/16/20   Danford, Suann Larry, MD    Allergies    Oxycodone, Iodinated diagnostic agents, Iodine, Linezolid, Methadone hcl, Promethazine, Isovue [iopamidol], Morphine sulfate, Omnipaque [iohexol], Other, Oxycodone hcl, Quetiapine, Renografin [diatrizoate], Statins, Zolpidem, Atorvastatin, Carbidopa-levodopa, Chlorhexidine gluconate [chlorhexidine], Clindamycin, Colesevelam, Doxazosin, Lovastatin, and Rosuvastatin  Review of Systems   Review of Systems  Constitutional:  Negative for chills and fever.  HENT:  Negative for hearing loss, nosebleeds and  rhinorrhea.   Respiratory:  Negative for cough and shortness of breath.   Cardiovascular:  Negative for chest pain and palpitations.  Gastrointestinal:  Positive for constipation. Negative for blood in stool, diarrhea, nausea and vomiting.  Genitourinary:  Positive for flank pain, frequency and hematuria. Negative for urgency.  Musculoskeletal:  Positive for back pain.  Skin:  Positive for wound.       Sacral decubitus ulcer  Neurological:  Negative for dizziness and light-headedness.   Physical Exam Updated Vital Signs BP (!) 159/90   Pulse (!) 102   Temp 97.9 F (36.6 C) (Oral)   Resp 17   SpO2 98%   Physical Exam Constitutional:      General: He is not in acute distress. HENT:     Head: Normocephalic and atraumatic.  Eyes:     Pupils: Pupils are equal, round, and reactive to light.  Cardiovascular:     Rate and Rhythm: Normal rate and regular rhythm.     Pulses: Normal pulses.     Heart sounds: Normal heart sounds. No murmur heard. Pulmonary:     Effort: Pulmonary effort is normal. No respiratory distress.     Breath sounds: Normal breath sounds. No wheezing, rhonchi or rales.  Abdominal:     General: Bowel sounds are normal. There is distension.     Palpations: Abdomen is soft.     Tenderness: There is abdominal tenderness. There is right CVA tenderness and left CVA tenderness.     Comments: Moderately distended and mild tenderness to palpation in lower quadrants  Musculoskeletal:     Right lower leg: No edema.     Left lower leg: No edema.  Skin:    General: Skin is warm and dry.     Comments: Sacral decubitus ulcer  Neurological:     Mental Status: He is alert and oriented to person, place, and time. Mental status is at baseline.    ED Results / Procedures / Treatments   Labs (all labs ordered are listed, but only abnormal results are displayed) Labs Reviewed  COMPREHENSIVE METABOLIC PANEL - Abnormal; Notable for the following components:      Result Value    Glucose, Bld 142 (*)    BUN 25 (*)    GFR, Estimated 60 (*)    All other components within normal limits  CBC WITH DIFFERENTIAL/PLATELET - Abnormal; Notable for the following components:   WBC 14.5 (*)  Neutro Abs 11.7 (*)    All other components within normal limits  LACTIC ACID, PLASMA - Abnormal; Notable for the following components:   Lactic Acid, Venous 2.4 (*)    All other components within normal limits  LACTIC ACID, PLASMA - Abnormal; Notable for the following components:   Lactic Acid, Venous 2.9 (*)    All other components within normal limits  URINE CULTURE  CULTURE, BLOOD (ROUTINE X 2)  CULTURE, BLOOD (ROUTINE X 2)  URINALYSIS, ROUTINE W REFLEX MICROSCOPIC    EKG None  Radiology CT ABDOMEN PELVIS WO CONTRAST  Result Date: 10/25/2020 CLINICAL DATA:  Flank pain and hematuria. Patient reports bilateral flank pain. Constipation. EXAM: CT ABDOMEN AND PELVIS WITHOUT CONTRAST TECHNIQUE: Multidetector CT imaging of the abdomen and pelvis was performed following the standard protocol without IV contrast. COMPARISON:  Noncontrast CT 2 weeks ago 10/12/2020 FINDINGS: Lower chest: Mild compressive atelectasis in the right lower lobe related to elevated hemidiaphragm. Coronary artery calcifications/stents. Normal heart size. Hepatobiliary: No focal hepatic abnormality on this unenhanced exam. Gallbladder physiologically distended, no calcified stone. No biliary dilatation. Pancreas: No ductal dilatation or inflammation. Spleen: Upper normal in size spanning 13.2 cm. No focal abnormality. Adrenals/Urinary Tract: No adrenal nodule. There is bilateral renal parenchymal thinning. No hydronephrosis. There are at least 3 small right renal calculi in the upper pole, nonobstructing. No left renal stones. There are bilateral renal cysts. 17 mm likely hyperdense cyst in the lateral mid left kidney, series 3, image 43, unchanged. No ureteral stones. Bladder is minimally distended, however mild  perivesicular fat stranding. Mild wall thickening about the posterior bladder base. Stomach/Bowel: The stomach is decompressed. There is no small bowel obstruction or inflammation. Normal appendix. Small to moderate volume of colonic stool. Left colonic diverticulosis. No diverticulitis. No abnormal rectal distension. No pericolonic edema. Vascular/Lymphatic: End-stage aortic atherosclerosis without aneurysm. No enlarged lymph nodes by size criteria. Reproductive: Prostate is unremarkable. Other: No free air or free fluid. Tiny fat containing umbilical hernia. Calcifications in the anterior omentum likely represent sequela of prior granulomatous disease. Musculoskeletal: L5-S1 degenerative disc disease. There are no acute or suspicious osseous abnormalities. IMPRESSION: 1. Nonobstructing right nephrolithiasis. No hydronephrosis or obstructive uropathy. 2. Mild perivesicular fat stranding, can be seen with urinary tract infection. Mild wall thickening about the posterior bladder base. 3. Colonic diverticulosis without diverticulitis. Aortic Atherosclerosis (ICD10-I70.0). Electronically Signed   By: Keith Rake M.D.   On: 10/25/2020 21:18    Procedures Procedures   Medications Ordered in ED Medications  sodium chloride 0.9 % bolus 1,000 mL (has no administration in time range)  piperacillin-tazobactam (ZOSYN) IVPB 3.375 g (has no administration in time range)  traMADol (ULTRAM) tablet 50 mg (50 mg Oral Given 10/25/20 2003)  acetaminophen (TYLENOL) tablet 1,000 mg (1,000 mg Oral Given 10/25/20 2003)    ED Course  I have reviewed the triage vital signs and the nursing notes.  Pertinent labs & imaging results that were available during my care of the patient were reviewed by me and considered in my medical decision making (see chart for details).    MDM Rules/Calculators/A&P                          Patient is an 85 yo male presenting to the ED from his urologists office for a complicated UTI  with hematuria and significant bilateral flank pain. Patient was recently admitted for complicated UTI and culture grew Klebsiella, resistant to almost all Po  abx and required IV vanc. Urologist sent pt in to get IV abx. Last culture was sensitive for Zosyn so we empirically started this as we await urine and blood cultures. Patient given Tramadol for pain while in ED. CT abd/pelvis ordered and showed nonobstructing right nephrolithiasis without hydronephrosis and mild perivesicular fat stranding consistent with UTI, also mild wall thickening about the posterior bladder base. Consult placed to the hospitalist group for admission and they accepted the patient.   Final Clinical Impression(s) / ED Diagnoses Final diagnoses:  None    Rx / DC Orders ED Discharge Orders     None        Dorethea Clan, DO 10/25/20 2234    Lucrezia Starch, MD 10/26/20 1704

## 2020-10-25 NOTE — Progress Notes (Signed)
Pharmacy Antibiotic Note  Jason Hartman is a 85 y.o. male admitted on 10/25/2020 presenting with bilateral flank pain.  Pharmacy has been consulted for zosyn dosing.  Plan: Zosyn 3.375g IV q 8h (extended infusion) Monitor renal function, UCx results to narrow     Temp (24hrs), Avg:97.8 F (36.6 C), Min:97.7 F (36.5 C), Max:97.9 F (36.6 C)  Recent Labs  Lab 10/25/20 1315 10/25/20 1409 10/25/20 1515  WBC 14.5*  --   --   CREATININE 1.21  --   --   LATICACIDVEN  --  2.4* 2.9*    Estimated Creatinine Clearance: 44.9 mL/min (by C-G formula based on SCr of 1.21 mg/dL).    Allergies  Allergen Reactions   Oxycodone Anaphylaxis    Other reaction(s): Other (See Comments) Cannot recall specifics   Iodinated Diagnostic Agents Hives    alleric to renografin,isovue & omnipaque, hives, requires 13 hr prep//a.calhoun, Onset Date: [id number]      Iodine Hives and Rash    alleric to renografin,isovue & omnipaque, hives, requires 13 hr prep//a.calhoun, Onset Date: [id number] allergic to renografin,isovue & omnipaque, hives, requires 13 hr prep//a.calhoun, Onset Date: [id number]   Linezolid Hives and Rash        Methadone Hcl Other (See Comments)    HALLUCINATIONS    Promethazine Other (See Comments)    Mental status change, DELIRIUM   Other reaction(s): erratic behavior Other reaction(s): Other (See Comments) Mental status change DELIRIUM (pulled out IV)   Isovue [Iopamidol]    Morphine Sulfate Other (See Comments)    Pt not sure about allergy    Omnipaque [Iohexol] Hives    Code: HIVES, Desc: alleric to renografin,isovue & omnipaque, hives, requires 13 hr prep//a.calhoun, Onset Date: [id number]    Other Hives    Other reaction(s): erratic behanvior Other reaction(s): erratic behavior   Oxycodone Hcl Other (See Comments)    Pt not sure about allergy    Quetiapine Other (See Comments)    Pt not sure about allergy  Other reaction(s): Other (See Comments) Pt not sure about  allergy  Pt not sure about allergy    Renografin [Diatrizoate]    Statins     Other reaction(s): muscle weakness Other reaction(s): muscle weakness   Zolpidem Other (See Comments)    Other reaction(s): erratic behanvior Other reaction(s): Delusions (intolerance) Altered mental status   Atorvastatin Itching and Other (See Comments)    Tired, weakness    Carbidopa-Levodopa Anxiety   Chlorhexidine Gluconate [Chlorhexidine] Hives and Rash   Clindamycin Rash   Colesevelam Other (See Comments)    tired    Doxazosin Rash   Lovastatin Other (See Comments)    Tired, nervousness    Rosuvastatin Other (See Comments)    Tired, weakness     Bertis Ruddy, PharmD Clinical Pharmacist ED Pharmacist Phone # [phone] 10/25/2020 7:30 PM

## 2020-10-25 NOTE — ED Triage Notes (Signed)
Patient BIB [facility] from PCP for evaluation of six week long urinary tract infection. Complains of bilateral flank pain and constipation. History of parkinson's disease, unable to stand and walk at baseline.

## 2020-10-26 DIAGNOSIS — E1122 Type 2 diabetes mellitus with diabetic chronic kidney disease: Secondary | ICD-10-CM | POA: Diagnosis present

## 2020-10-26 DIAGNOSIS — Z87440 Personal history of urinary (tract) infections: Secondary | ICD-10-CM | POA: Diagnosis not present

## 2020-10-26 DIAGNOSIS — N138 Other obstructive and reflux uropathy: Secondary | ICD-10-CM | POA: Diagnosis present

## 2020-10-26 DIAGNOSIS — E1142 Type 2 diabetes mellitus with diabetic polyneuropathy: Secondary | ICD-10-CM | POA: Diagnosis present

## 2020-10-26 DIAGNOSIS — R278 Other lack of coordination: Secondary | ICD-10-CM | POA: Diagnosis not present

## 2020-10-26 DIAGNOSIS — E785 Hyperlipidemia, unspecified: Secondary | ICD-10-CM | POA: Diagnosis present

## 2020-10-26 DIAGNOSIS — Z1612 Extended spectrum beta lactamase (ESBL) resistance: Secondary | ICD-10-CM | POA: Diagnosis present

## 2020-10-26 DIAGNOSIS — R41841 Cognitive communication deficit: Secondary | ICD-10-CM | POA: Diagnosis not present

## 2020-10-26 DIAGNOSIS — Z885 Allergy status to narcotic agent status: Secondary | ICD-10-CM | POA: Diagnosis not present

## 2020-10-26 DIAGNOSIS — E440 Moderate protein-calorie malnutrition: Secondary | ICD-10-CM | POA: Diagnosis present

## 2020-10-26 DIAGNOSIS — G9340 Encephalopathy, unspecified: Secondary | ICD-10-CM | POA: Diagnosis not present

## 2020-10-26 DIAGNOSIS — Z91041 Radiographic dye allergy status: Secondary | ICD-10-CM | POA: Diagnosis not present

## 2020-10-26 DIAGNOSIS — G4733 Obstructive sleep apnea (adult) (pediatric): Secondary | ICD-10-CM | POA: Diagnosis present

## 2020-10-26 DIAGNOSIS — I10 Essential (primary) hypertension: Secondary | ICD-10-CM | POA: Diagnosis not present

## 2020-10-26 DIAGNOSIS — A4159 Other Gram-negative sepsis: Secondary | ICD-10-CM | POA: Diagnosis present

## 2020-10-26 DIAGNOSIS — Z6830 Body mass index (BMI) 30.0-30.9, adult: Secondary | ICD-10-CM | POA: Diagnosis not present

## 2020-10-26 DIAGNOSIS — Z20822 Contact with and (suspected) exposure to covid-19: Secondary | ICD-10-CM | POA: Diagnosis present

## 2020-10-26 DIAGNOSIS — R6520 Severe sepsis without septic shock: Secondary | ICD-10-CM | POA: Diagnosis not present

## 2020-10-26 DIAGNOSIS — R1312 Dysphagia, oropharyngeal phase: Secondary | ICD-10-CM | POA: Diagnosis not present

## 2020-10-26 DIAGNOSIS — I129 Hypertensive chronic kidney disease with stage 1 through stage 4 chronic kidney disease, or unspecified chronic kidney disease: Secondary | ICD-10-CM | POA: Diagnosis present

## 2020-10-26 DIAGNOSIS — R2681 Unsteadiness on feet: Secondary | ICD-10-CM | POA: Diagnosis not present

## 2020-10-26 DIAGNOSIS — N390 Urinary tract infection, site not specified: Secondary | ICD-10-CM | POA: Diagnosis not present

## 2020-10-26 DIAGNOSIS — I480 Paroxysmal atrial fibrillation: Secondary | ICD-10-CM | POA: Diagnosis present

## 2020-10-26 DIAGNOSIS — M1990 Unspecified osteoarthritis, unspecified site: Secondary | ICD-10-CM | POA: Diagnosis not present

## 2020-10-26 DIAGNOSIS — R296 Repeated falls: Secondary | ICD-10-CM | POA: Diagnosis present

## 2020-10-26 DIAGNOSIS — N3090 Cystitis, unspecified without hematuria: Secondary | ICD-10-CM | POA: Diagnosis present

## 2020-10-26 DIAGNOSIS — F32A Depression, unspecified: Secondary | ICD-10-CM | POA: Diagnosis present

## 2020-10-26 DIAGNOSIS — N1832 Chronic kidney disease, stage 3b: Secondary | ICD-10-CM | POA: Diagnosis present

## 2020-10-26 DIAGNOSIS — R319 Hematuria, unspecified: Secondary | ICD-10-CM | POA: Diagnosis not present

## 2020-10-26 DIAGNOSIS — C679 Malignant neoplasm of bladder, unspecified: Secondary | ICD-10-CM | POA: Diagnosis present

## 2020-10-26 DIAGNOSIS — N200 Calculus of kidney: Secondary | ICD-10-CM | POA: Diagnosis present

## 2020-10-26 DIAGNOSIS — Z7401 Bed confinement status: Secondary | ICD-10-CM | POA: Diagnosis not present

## 2020-10-26 DIAGNOSIS — G20 Parkinson's disease: Secondary | ICD-10-CM | POA: Diagnosis present

## 2020-10-26 DIAGNOSIS — A419 Sepsis, unspecified organism: Secondary | ICD-10-CM | POA: Diagnosis not present

## 2020-10-26 DIAGNOSIS — G2581 Restless legs syndrome: Secondary | ICD-10-CM | POA: Diagnosis present

## 2020-10-26 DIAGNOSIS — M6281 Muscle weakness (generalized): Secondary | ICD-10-CM | POA: Diagnosis not present

## 2020-10-26 LAB — URINALYSIS, ROUTINE W REFLEX MICROSCOPIC

## 2020-10-26 LAB — COMPREHENSIVE METABOLIC PANEL
ALT: 19 U/L (ref 0–44)
AST: 12 U/L — ABNORMAL LOW (ref 15–41)
Albumin: 2.8 g/dL — ABNORMAL LOW (ref 3.5–5.0)
Alkaline Phosphatase: 57 U/L (ref 38–126)
Anion gap: 9 (ref 5–15)
BUN: 29 mg/dL — ABNORMAL HIGH (ref 8–23)
CO2: 22 mmol/L (ref 22–32)
Calcium: 8.7 mg/dL — ABNORMAL LOW (ref 8.9–10.3)
Chloride: 105 mmol/L (ref 98–111)
Creatinine, Ser: 1.25 mg/dL — ABNORMAL HIGH (ref 0.61–1.24)
GFR, Estimated: 57 mL/min — ABNORMAL LOW (ref >60)
Glucose, Bld: 102 mg/dL — ABNORMAL HIGH (ref 70–99)
Potassium: 3.8 mmol/L (ref 3.5–5.1)
Sodium: 136 mmol/L (ref 135–145)
Total Bilirubin: 1.2 mg/dL (ref 0.3–1.2)
Total Protein: 5.4 g/dL — ABNORMAL LOW (ref 6.5–8.1)

## 2020-10-26 LAB — URINALYSIS, MICROSCOPIC (REFLEX)
RBC / HPF: >50 RBC/hpf (ref 0–5)
WBC, UA: >50 WBC/hpf (ref 0–5)

## 2020-10-26 LAB — CBC WITH DIFFERENTIAL/PLATELET
Abs Immature Granulocytes: 0.05 10*3/uL (ref 0.00–0.07)
Basophils Absolute: 0.1 10*3/uL (ref 0.0–0.1)
Basophils Relative: 1 %
Eosinophils Absolute: 0.3 10*3/uL (ref 0.0–0.5)
Eosinophils Relative: 3 %
HCT: 42.9 % (ref 39.0–52.0)
Hemoglobin: 14 g/dL (ref 13.0–17.0)
Immature Granulocytes: 1 %
Lymphocytes Relative: 12 %
Lymphs Abs: 1.3 10*3/uL (ref 0.7–4.0)
MCH: 28.9 pg (ref 26.0–34.0)
MCHC: 32.6 g/dL (ref 30.0–36.0)
MCV: 88.5 fL (ref 80.0–100.0)
Monocytes Absolute: 0.7 10*3/uL (ref 0.1–1.0)
Monocytes Relative: 7 %
Neutro Abs: 7.9 10*3/uL — ABNORMAL HIGH (ref 1.7–7.7)
Neutrophils Relative %: 76 %
Platelets: 178 10*3/uL (ref 150–400)
RBC: 4.85 MIL/uL (ref 4.22–5.81)
RDW: 15.2 % (ref 11.5–15.5)
WBC: 10.3 10*3/uL (ref 4.0–10.5)
nRBC: 0 % (ref 0.0–0.2)

## 2020-10-26 LAB — MAGNESIUM: Magnesium: 1.7 mg/dL (ref 1.7–2.4)

## 2020-10-26 LAB — CBG MONITORING, ED
Glucose-Capillary: 101 mg/dL — ABNORMAL HIGH (ref 70–99)
Glucose-Capillary: 134 mg/dL — ABNORMAL HIGH (ref 70–99)
Glucose-Capillary: 163 mg/dL — ABNORMAL HIGH (ref 70–99)
Glucose-Capillary: 95 mg/dL (ref 70–99)

## 2020-10-26 LAB — GLUCOSE, CAPILLARY: Glucose-Capillary: 112 mg/dL — ABNORMAL HIGH (ref 70–99)

## 2020-10-26 LAB — TSH: TSH: 2.264 u[IU]/mL (ref 0.350–4.500)

## 2020-10-26 LAB — PHOSPHORUS: Phosphorus: 3.7 mg/dL (ref 2.5–4.6)

## 2020-10-26 LAB — LACTIC ACID, PLASMA: Lactic Acid, Venous: 1.1 mmol/L (ref 0.5–1.9)

## 2020-10-26 MED ORDER — DULOXETINE HCL 30 MG PO CPEP
30.0000 mg | ORAL_CAPSULE | Freq: Every day | ORAL | Status: DC
  Administered 2020-10-26 – 2020-10-30 (×5): 30 mg via ORAL
  Filled 2020-10-26 (×5): qty 1

## 2020-10-26 MED ORDER — SODIUM CHLORIDE 0.9 % IV SOLN
75.0000 mL/h | INTRAVENOUS | Status: DC
  Administered 2020-10-26 – 2020-10-27 (×2): 75 mL/h via INTRAVENOUS

## 2020-10-26 MED ORDER — SORBITOL 70 % SOLN
15.0000 mL | Freq: Every day | Status: DC
  Administered 2020-10-26 – 2020-10-27 (×2): 15 mL via ORAL
  Filled 2020-10-26 (×2): qty 30

## 2020-10-26 MED ORDER — TRAMADOL HCL 50 MG PO TABS
50.0000 mg | ORAL_TABLET | Freq: Two times a day (BID) | ORAL | Status: DC | PRN
  Administered 2020-10-26 – 2020-10-30 (×6): 50 mg via ORAL
  Filled 2020-10-26 (×6): qty 1

## 2020-10-26 MED ORDER — ZINC OXIDE 40 % EX OINT
TOPICAL_OINTMENT | Freq: Two times a day (BID) | CUTANEOUS | Status: DC
  Administered 2020-10-30: 1 via TOPICAL
  Filled 2020-10-26: qty 57

## 2020-10-26 NOTE — Evaluation (Signed)
Physical Therapy Evaluation Patient Details Name: Jason Hartman MRN: [id number] DOB: 06/21/1935 Today's Date: 10/26/2020   History of Present Illness  Pt is an 85 y/o male admitted from SNF on 7/20 secondary to prolonged UTI. Also found to have pressure injury near sacrum. PMH includes DM, HTN, Parkinson's disease, a fib, and restless leg syndrome.  Clinical Impression  Pt admitted secondary to problem above with deficits below. Only agreeable to bed mobility today. Noted weakness in LLE and pt reporting pain in bottom and L shoulder. Required mod A for rolling for repositioning. Pt from [facility] and recommend return to SNF for continued rehab at d/c. Will continue to follow acutely.     Follow Up Recommendations SNF    Equipment Recommendations  None recommended by PT    Recommendations for Other Services       Precautions / Restrictions Precautions Precautions: Fall Restrictions Weight Bearing Restrictions: No      Mobility  Bed Mobility Overal bed mobility: Needs Assistance Bed Mobility: Rolling Rolling: Mod assist         General bed mobility comments: Mod A for assist with rolling to his side for repositioning. Was able to use rail for bed mobility.  Pt had just gotten up with RN for toileting and did not want to perform OOB mobility.    Transfers                 General transfer comment: deferred  Ambulation/Gait                Stairs            Wheelchair Mobility    Modified Rankin (Stroke Patients Only)       Balance                                             Pertinent Vitals/Pain Pain Assessment: Faces Faces Pain Scale: Hurts little more Pain Location: sacral area Pain Descriptors / Indicators: Grimacing;Guarding Pain Intervention(s): Limited activity within patient's tolerance;Monitored during session;Repositioned    Home Living Family/patient expects to be discharged to:: Skilled nursing  facility                 Additional Comments: [place]    Prior Function Level of Independence: Needs assistance   Gait / Transfers Assistance Needed: Pt reports he requires +2-3 people to take steps to chair normally at SNF. Has worked with PT some as well on ambulation  ADL's / Homemaking Assistance Needed: Required assist for ADLs.        Hand Dominance        Extremity/Trunk Assessment   Upper Extremity Assessment Upper Extremity Assessment: Defer to OT evaluation (L shoulder issues at baseline)    Lower Extremity Assessment Lower Extremity Assessment: LLE deficits/detail LLE Deficits / Details: LLE weakness. Difficulty performing SLR and heel slide.       Communication   Communication: No difficulties  Cognition Arousal/Alertness: Awake/alert Behavior During Therapy: WFL for tasks assessed/performed Overall Cognitive Status: No family/caregiver present to determine baseline cognitive functioning                                 General Comments: A and O X4. Very hesitant to perform mobility tasks.      General Comments  Exercises     Assessment/Plan    PT Assessment Patient needs continued PT services  PT Problem List Decreased strength;Decreased mobility;Decreased activity tolerance;Decreased balance;Decreased knowledge of use of DME;Pain       PT Treatment Interventions Therapeutic exercise;Gait training;Balance training;Functional mobility training;Therapeutic activities;Patient/family education;DME instruction    PT Goals (Current goals can be found in the Care Plan section)  Acute Rehab PT Goals Patient Stated Goal: get better PT Goal Formulation: With patient Time For Goal Achievement: 11/09/20 Potential to Achieve Goals: Fair    Frequency Min 2X/week   Barriers to discharge        Co-evaluation               AM-PAC PT "6 Clicks" Mobility  Outcome Measure Help needed turning from your back to your side  while in a flat bed without using bedrails?: A Lot Help needed moving from lying on your back to sitting on the side of a flat bed without using bedrails?: A Lot Help needed moving to and from a bed to a chair (including a wheelchair)?: Total Help needed standing up from a chair using your arms (e.g., wheelchair or bedside chair)?: Total Help needed to walk in hospital room?: Total Help needed climbing 3-5 steps with a railing? : Total 6 Click Score: 8    End of Session   Activity Tolerance: Patient limited by fatigue;Patient limited by pain Patient left: in bed;with call bell/phone within reach (on stretcher in ED) Nurse Communication: Mobility status PT Visit Diagnosis: Muscle weakness (generalized) (M62.81);Unsteadiness on feet (R26.81);Difficulty in walking, not elsewhere classified (R26.2);Pain Pain - Right/Left: Left Pain - part of body: Shoulder (bottom, back)    Time: 7341-9379 PT Time Calculation (min) (ACUTE ONLY): 13 min   Charges:   PT Evaluation $PT Eval Moderate Complexity: 1 Mod          Reuel Derby, PT, DPT  Acute Rehabilitation Services  Pager: [phone] Office: [phone]   Rudean Hitt 10/26/2020, 12:49 PM

## 2020-10-26 NOTE — ED Notes (Signed)
Changed pt; gown placed clean chux/patient was  Report position

## 2020-10-26 NOTE — Progress Notes (Signed)
PROGRESS NOTE   ABDELAZIZ WESTENBERGER  VQQ:[id number] DOB: 09/09/1935 DOA: 10/25/2020 PCP: [name], MD  Brief Narrative:  85 year old white male Parkinson's disease on rotigotine, RLS, OSA not on BiPAP DM TY 2 with nephropathy neuropathy CKD 3B Recent diagnosis PAF 09/2020 CHADS2 score >3 no AC secondary to urethral bleeding Prior urethral stricture status post dilatation + malignant neoplasm of urinary bladder neck (Dr. Thomasene Mohair at [facility])  Foley placement and treated for Enterococcus at that time-DC to rehab but had numerous falls subsequently and recently admitted subsequently 7 6-7/02/2021 -Grew out pan resistant Klebsiella at the time-DC on IV ertapenem via PICC line-last day of therapy was 7/15 per [facility]  He was supposed to follow-up in March for cystoscopy but never returned to their office for work-up and in the interim has had multiple admissions for urinary retention urinary tract infections etc.  Last seen by Retia Passe, [place] with urologist 10/25/2020 and sent to the ED because he was feeling poorly having abdominal pain  coffee-colored urine frequency bilateral flank pain 10/10 constipation obstipation CT abdomen pelvis = nonobstructive right nephrolithiasis without hydronephrosis and perivascular stranding   Hospital-Problem based course  Sepsis on admission secondary to recurrent UTI -Continue Zosyn for empiric coverage, follow urine/blood culture - will need close outpatient follow-up with his urologist Dr. Thomasene Mohair -Continue saline 75 cc an hour for now  Recent onset atrial fibrillation 09/25/2020 CHADS2 score >3 - Amiodarone 200 daily, metoprolol 12.5 twice daily - Keep on telemetry today-check a.m. magnesium given he is on psychotropics in addition  DM TY 2 neuropathy nephropathy - Home meds Lantus 8 twice daily, metformin 500 every morning, at this time we have held semaglutide  Parkinson's disease - Continue rotigotine 8 mg/20 4H patch and outpatient follow-up  with specialist - Continue resuming Cymbalta 30 daily, Elavil 10 at bedtime sacral decubitus  Sacral decubitus secondary to immobility -Appreciate wound care instructions, please turn patient every 2 as needed -Apply Desitin to buttocks/sacrum BID and PRN when turning or cleaning.   DVT prophylaxis: SCD Code Status: Full discussed with wife at patient's request on telephone Family Communication:  Disposition:  Status is: Observation  The patient will require care spanning > 2 midnights and should be moved to inpatient because: Hemodynamically unstable, Persistent severe electrolyte disturbances, Altered mental status, and Ongoing diagnostic testing needed not appropriate for outpatient work up  Dispo: The patient is from: Home              Anticipated d/c is to: SNF              Patient currently is not medically stable to d/c.   Difficult to place patient No       Consultants:  None  Procedures: No  Antimicrobials: Zosyn   Subjective:  Feels somewhat better although tells me he has not been able to walk or move in the past 3 weeks Quite anxious about next steps  Objective: Vitals:   10/26/20 0645 10/26/20 0700 10/26/20 0800 10/26/20 0830  BP: 123/65 123/60  113/63  Pulse: 70 72  72  Resp: 18 19  19   Temp:      TempSrc:      SpO2: 93% 91%  90%  Weight:   80 kg   Height:   5\' 4"  (1.626 m)     Intake/Output Summary (Last 24 hours) at 10/26/2020 1138 Last data filed at 10/26/2020 1051 Gross per 24 hour  Intake 1761.36 ml  Output --  Net 1761.36 ml  Filed Weights   10/26/20 0800  Weight: 80 kg    Examination:  EOMI NCAT frail cachectic white male no distress Supraclavicular wasting S1-S2 no murmur sinus Abdomen soft Condom catheter in place urine does not seem dark No lower extremity edema Decubitus not examined Chest clear no added sound  Data Reviewed: personally reviewed   CBC    Component Value Date/Time   WBC 10.3 10/26/2020 0404   RBC  4.85 10/26/2020 0404   HGB 14.0 10/26/2020 0404   HGB 13.2 11/12/2016 1129   HCT 42.9 10/26/2020 0404   HCT 41.9 11/12/2016 1129   PLT 178 10/26/2020 0404   PLT 185 11/12/2016 1129   MCV 88.5 10/26/2020 0404   MCV 83 11/12/2016 1129   MCH 28.9 10/26/2020 0404   MCHC 32.6 10/26/2020 0404   RDW 15.2 10/26/2020 0404   RDW 15.2 11/12/2016 1129   LYMPHSABS 1.3 10/26/2020 0404   LYMPHSABS 1.4 11/12/2016 1129   MONOABS 0.7 10/26/2020 0404   EOSABS 0.3 10/26/2020 0404   EOSABS 0.2 11/12/2016 1129   BASOSABS 0.1 10/26/2020 0404   BASOSABS 0.0 11/12/2016 1129   CMP Latest Ref Rng & Units 10/26/2020 10/25/2020 10/15/2020  Glucose 70 - 99 mg/dL 102(H) 142(H) 206(H)  BUN 8 - 23 mg/dL 29(H) 25(H) 17  Creatinine 0.61 - 1.24 mg/dL 1.25(H) 1.21 1.01  Sodium 135 - 145 mmol/L 136 136 133(L)  Potassium 3.5 - 5.1 mmol/L 3.8 4.3 4.0  Chloride 98 - 111 mmol/L 105 100 100  CO2 22 - 32 mmol/L 22 22 27   Calcium 8.9 - 10.3 mg/dL 8.7(L) 9.5 8.7(L)  Total Protein 6.5 - 8.1 g/dL 5.4(L) 6.7 -  Total Bilirubin 0.3 - 1.2 mg/dL 1.2 0.8 -  Alkaline Phos 38 - 126 U/L 57 68 -  AST 15 - 41 U/L 12(L) 17 -  ALT 0 - 44 U/L 19 26 -     Radiology Studies: CT ABDOMEN PELVIS WO CONTRAST  Result Date: 10/25/2020 CLINICAL DATA:  Flank pain and hematuria. Patient reports bilateral flank pain. Constipation. EXAM: CT ABDOMEN AND PELVIS WITHOUT CONTRAST TECHNIQUE: Multidetector CT imaging of the abdomen and pelvis was performed following the standard protocol without IV contrast. COMPARISON:  Noncontrast CT 2 weeks ago 10/12/2020 FINDINGS: Lower chest: Mild compressive atelectasis in the right lower lobe related to elevated hemidiaphragm. Coronary artery calcifications/stents. Normal heart size. Hepatobiliary: No focal hepatic abnormality on this unenhanced exam. Gallbladder physiologically distended, no calcified stone. No biliary dilatation. Pancreas: No ductal dilatation or inflammation. Spleen: Upper normal in size spanning  13.2 cm. No focal abnormality. Adrenals/Urinary Tract: No adrenal nodule. There is bilateral renal parenchymal thinning. No hydronephrosis. There are at least 3 small right renal calculi in the upper pole, nonobstructing. No left renal stones. There are bilateral renal cysts. 17 mm likely hyperdense cyst in the lateral mid left kidney, series 3, image 43, unchanged. No ureteral stones. Bladder is minimally distended, however mild perivesicular fat stranding. Mild wall thickening about the posterior bladder base. Stomach/Bowel: The stomach is decompressed. There is no small bowel obstruction or inflammation. Normal appendix. Small to moderate volume of colonic stool. Left colonic diverticulosis. No diverticulitis. No abnormal rectal distension. No pericolonic edema. Vascular/Lymphatic: End-stage aortic atherosclerosis without aneurysm. No enlarged lymph nodes by size criteria. Reproductive: Prostate is unremarkable. Other: No free air or free fluid. Tiny fat containing umbilical hernia. Calcifications in the anterior omentum likely represent sequela of prior granulomatous disease. Musculoskeletal: L5-S1 degenerative disc disease. There are no acute or suspicious  osseous abnormalities. IMPRESSION: 1. Nonobstructing right nephrolithiasis. No hydronephrosis or obstructive uropathy. 2. Mild perivesicular fat stranding, can be seen with urinary tract infection. Mild wall thickening about the posterior bladder base. 3. Colonic diverticulosis without diverticulitis. Aortic Atherosclerosis (ICD10-I70.0). Electronically Signed   By: Keith Rake M.D.   On: 10/25/2020 21:18     Scheduled Meds:  amiodarone  200 mg Oral Daily   amitriptyline  10 mg Oral QHS   apixaban  5 mg Oral BID   docusate sodium  100 mg Oral BID   gabapentin  300 mg Oral QHS   insulin aspart  0-9 Units Subcutaneous Q4H   insulin glargine  8 Units Subcutaneous BID   liver oil-zinc oxide   Topical BID   metoprolol tartrate  12.5 mg Oral BID    Rotigotine  1 patch Transdermal QPM   senna  1 tablet Oral BID   thiamine  100 mg Oral Daily   traMADol  50 mg Oral Daily   Continuous Infusions:  piperacillin-tazobactam (ZOSYN)  IV Stopped (10/26/20 1051)     LOS: 0 days   Time spent: 55  Nita Sells, MD [facility] To contact the attending provider between 7A-7P or the covering provider during after hours 7P-7A, please log into the web site [email or web address] and access using universal [place] password for that web site. If you do not have the password, please call the hospital operator.  10/26/2020, 11:38 AM

## 2020-10-26 NOTE — ED Notes (Signed)
Tele  Breakfast Ordered 

## 2020-10-26 NOTE — Evaluation (Signed)
Clinical/Bedside Swallow Evaluation Patient Details  Name: Jason Hartman MRN: [id number] Date of Birth: 26-Feb-1936  Today's Date: 10/26/2020 Time: SLP Start Time (ACUTE ONLY): 23 SLP Stop Time (ACUTE ONLY): 1026 SLP Time Calculation (min) (ACUTE ONLY): 8 min  Past Medical History:  Past Medical History:  Diagnosis Date   Cellulitis and abscess of leg, except foot    Hypertension    Obstructive sleep apnea (adult) (pediatric)    Pain in joint, pelvic region and thigh    Pneumonia, organism unspecified(486)    Restless legs syndrome (RLS)    RLS (restless legs syndrome) 09/01/2012   Thoracic or lumbosacral neuritis or radiculitis, unspecified    Type II or unspecified type diabetes mellitus without mention of complication, not stated as uncontrolled    Unspecified disease of pericardium    Unspecified hereditary and idiopathic peripheral neuropathy    Past Surgical History:  Past Surgical History:  Procedure Laterality Date   ANAL FISSURE REPAIR     pericarditis  2009   HPI:  85 y.o. male with medical history significant of DM2, CAD   BPH. Bladder ca, recurrent UTI, A.fib on Eliquis    Admitted for UTI, debility   Assessment / Plan / Recommendation Clinical Impression  Pt demonstrates normal swallow function. No signs of dysphagia or aspiration. Pt denies history of difficulty. Pt to continue current diet. SLP will sign off. SLP Visit Diagnosis: Dysphagia, unspecified (R13.10)    Aspiration Risk  No limitations    Diet Recommendation Regular;Thin liquid   Liquid Administration via: Cup;Straw Medication Administration: Whole meds with liquid    Other  Recommendations     Follow up Recommendations None      Frequency and Duration            Prognosis        Swallow Study   General HPI: 85 y.o. male with medical history significant of DM2, CAD   BPH. Bladder ca, recurrent UTI, A.fib on Eliquis    Admitted for UTI, debility Type of Study: Bedside Swallow  Evaluation Previous Swallow Assessment: none Diet Prior to this Study: Regular;Thin liquids Temperature Spikes Noted: No Respiratory Status: Room air History of Recent Intubation: No Behavior/Cognition: Alert;Cooperative;Pleasant mood Oral Cavity Assessment: Within Functional Limits Oral Care Completed by SLP: No Oral Cavity - Dentition: Adequate natural dentition Vision: Functional for self-feeding Patient Positioning: Upright in bed Baseline Vocal Quality: Normal Volitional Cough: Strong Volitional Swallow: Able to elicit    Oral/Motor/Sensory Function Overall Oral Motor/Sensory Function: Within functional limits   Ice Chips Ice chips: Not tested   Thin Liquid Thin Liquid: Within functional limits    Nectar Thick Nectar Thick Liquid: Not tested   Honey Thick Honey Thick Liquid: Not tested   Puree Puree: Within functional limits   Solid     Solid: Within functional limits     Herbie [place], MA [place] Pager [phone] Office [phone]  Lynann Beaver 10/26/2020,10:29 AM

## 2020-10-26 NOTE — ED Notes (Signed)
Patient ate 100% 480to drink ,change patient brief and place a draw sheetunder pt.reposition

## 2020-10-26 NOTE — ED Notes (Signed)
Pt brief and linens changed. New external catheter applied.

## 2020-10-26 NOTE — Consult Note (Signed)
WOC Nurse Consult Note: Consult requested for pressure injury to buttocks.  Assessed pt and there is not a pressure injury. Pt is incontinent of urine and stool. Bilat buttocks/sacrum with red macerated skin; appearance consistent with moisture associated skin damage and there is a partial thickness fissure in the gluteal fold; 2X.1X.1cm, red and moist Pressure Injury POA: This is a partial thickness fissure related to moisture, NOT pressure, and was present on admission. Dressing procedure/placement/frequency: Topical treatment orders provided for bedside nurses to perform as follows to protect skin and repel moisture: Apply Desitin to buttocks/sacrum BID and PRN when turning or cleaning. Please re-consult if further assistance is needed.  Thank-you,  Julien Girt MSN, [place], [place], [place], [place]

## 2020-10-27 DIAGNOSIS — R319 Hematuria, unspecified: Secondary | ICD-10-CM | POA: Diagnosis not present

## 2020-10-27 DIAGNOSIS — N390 Urinary tract infection, site not specified: Secondary | ICD-10-CM | POA: Diagnosis not present

## 2020-10-27 DIAGNOSIS — E440 Moderate protein-calorie malnutrition: Secondary | ICD-10-CM | POA: Insufficient documentation

## 2020-10-27 LAB — GLUCOSE, CAPILLARY
Glucose-Capillary: 104 mg/dL — ABNORMAL HIGH (ref 70–99)
Glucose-Capillary: 108 mg/dL — ABNORMAL HIGH (ref 70–99)
Glucose-Capillary: 115 mg/dL — ABNORMAL HIGH (ref 70–99)
Glucose-Capillary: 149 mg/dL — ABNORMAL HIGH (ref 70–99)
Glucose-Capillary: 155 mg/dL — ABNORMAL HIGH (ref 70–99)
Glucose-Capillary: 156 mg/dL — ABNORMAL HIGH (ref 70–99)
Glucose-Capillary: 278 mg/dL — ABNORMAL HIGH (ref 70–99)

## 2020-10-27 MED ORDER — ADULT MULTIVITAMIN W/MINERALS CH
1.0000 | ORAL_TABLET | Freq: Every day | ORAL | Status: DC
  Administered 2020-10-27 – 2020-10-30 (×4): 1 via ORAL
  Filled 2020-10-27 (×3): qty 1

## 2020-10-27 MED ORDER — GLUCERNA SHAKE PO LIQD
237.0000 mL | Freq: Three times a day (TID) | ORAL | Status: DC
  Administered 2020-10-27 – 2020-10-30 (×5): 237 mL via ORAL
  Filled 2020-10-27 (×2): qty 237

## 2020-10-27 MED ORDER — PROSOURCE PLUS PO LIQD
30.0000 mL | Freq: Two times a day (BID) | ORAL | Status: DC
  Administered 2020-10-28 – 2020-10-30 (×6): 30 mL via ORAL
  Filled 2020-10-27 (×5): qty 30

## 2020-10-27 NOTE — Evaluation (Signed)
Occupational Therapy Evaluation Patient Details Name: Jason Hartman MRN: [id number] DOB: 24-Oct-1935 Today's Date: 10/27/2020    History of Present Illness Pt is an 85 y/o male admitted from SNF on 7/20 secondary to prolonged UTI. Also found to have pressure injury near sacrum. PMH includes DM, HTN, Parkinson's disease, a fib, and restless leg syndrome.   Clinical Impression   Pt admitted for concerns listed above. PTA pt reported that he was requiring max A for all mobility and ADL's at the SNF. This session pt deferred all OOB mobility due to pain and fatigue. Pt requires mod A for rolling in bed and min-max A for all ADL's. Pt appears to be self limiting with some ADL's as he has functional ROM and MMT in RUE is 4/5. Pt will benefit from returning to SNF to complete his Rehab stay. OT will follow acutely.     Follow Up Recommendations  SNF;Supervision/Assistance - 24 hour    Equipment Recommendations  None recommended by OT    Recommendations for Other Services       Precautions / Restrictions Precautions Precautions: Fall Restrictions Weight Bearing Restrictions: No      Mobility Bed Mobility Overal bed mobility: Needs Assistance Bed Mobility: Rolling Rolling: Mod assist         General bed mobility comments: Mod A for assist with rolling to his side for repositioning. Was able to use rail for bed mobility.  Pt had just gotten up with RN for toileting and did not want to perform OOB mobility.    Transfers                 General transfer comment: deferred    Balance                                           ADL either performed or assessed with clinical judgement   ADL Overall ADL's : Needs assistance/impaired Eating/Feeding: Moderate assistance;Bed level   Grooming: Moderate assistance;Bed level   Upper Body Bathing: Moderate assistance;Maximal assistance;Bed level   Lower Body Bathing: Total assistance;+2 for physical  assistance;+2 for safety/equipment;Bed level   Upper Body Dressing : Moderate assistance;Bed level   Lower Body Dressing: Total assistance;+2 for physical assistance;+2 for safety/equipment;Bed level   Toilet Transfer: +2 for physical assistance;+2 for safety/equipment;Total assistance   Toileting- Clothing Manipulation and Hygiene: Maximal assistance;Total assistance;+2 for physical assistance;+2 for safety/equipment;Bed level   Tub/ Shower Transfer: +2 for physical assistance;+2 for safety/equipment;Total assistance   Functional mobility during ADLs: Maximal assistance;+2 for physical assistance;+2 for safety/equipment General ADL Comments: Pt appears self limiting, reporting that he can not do anything for himself, requires 2-3 people to assist him with all ADL's at SNF.     Vision Baseline Vision/History: Wears glasses Wears Glasses: At all times Patient Visual Report: No change from baseline Vision Assessment?: No apparent visual deficits     Perception Perception Perception Tested?: No   Praxis Praxis Praxis tested?: Not tested    Pertinent Vitals/Pain Pain Assessment: Faces Faces Pain Scale: Hurts even more Pain Location: sacral area Pain Descriptors / Indicators: Grimacing;Guarding Pain Intervention(s): Limited activity within patient's tolerance;Monitored during session;Repositioned     Hand Dominance     Extremity/Trunk Assessment Upper Extremity Assessment Upper Extremity Assessment: Generalized weakness;LUE deficits/detail LUE Deficits / Details: states he needs his shoulder replaced. Not functional LUE Sensation: decreased light touch LUE Coordination: decreased  fine motor;decreased gross motor   Lower Extremity Assessment Lower Extremity Assessment: Defer to PT evaluation   Cervical / Trunk Assessment Cervical / Trunk Assessment: Kyphotic   Communication Communication Communication: No difficulties   Cognition Arousal/Alertness:  Awake/alert Behavior During Therapy: WFL for tasks assessed/performed Overall Cognitive Status: No family/caregiver present to determine baseline cognitive functioning                                 General Comments: A and O X4. Very hesitant to perform mobility tasks.   General Comments  Pt deferred all OOB activity due to pain and fatigue. He required total clean up after a BM at beginning of session    Exercises     Shoulder Instructions      Home Living Family/patient expects to be discharged to:: Skilled nursing facility                                 Additional Comments: [facility]      Prior Functioning/Environment Level of Independence: Needs assistance  Gait / Transfers Assistance Needed: Pt reports he requires +2-3 people to take steps to chair normally at SNF. Has worked with PT some as well on ambulation ADL's / Homemaking Assistance Needed: Required assist for ADLs.            OT Problem List: Decreased strength;Decreased range of motion;Decreased activity tolerance;Impaired balance (sitting and/or standing);Decreased coordination;Decreased cognition;Decreased safety awareness;Decreased knowledge of use of DME or AE;Impaired UE functional use      OT Treatment/Interventions: Self-care/ADL training;Therapeutic exercise;Energy conservation;DME and/or AE instruction;Therapeutic activities;Cognitive remediation/compensation;Patient/family education;Balance training    OT Goals(Current goals can be found in the care plan section) Acute Rehab OT Goals Patient Stated Goal: get better OT Goal Formulation: With patient Time For Goal Achievement: 11/10/20 Potential to Achieve Goals: Fair ADL Goals Pt Will Perform Eating: with set-up;with adaptive utensils;sitting Pt Will Perform Grooming: with set-up;sitting Additional ADL Goal #1: Pt will complete bed mobility with  min A Additional ADL Goal #2: Pt will tolerate sitting EOB for 3 mins  to improve activity tolerance for seated ADL's.  OT Frequency: Min 2X/week   Barriers to D/C:            Co-evaluation              AM-PAC OT "6 Clicks" Daily Activity     Outcome Measure Help from another person eating meals?: A Lot Help from another person taking care of personal grooming?: A Lot Help from another person toileting, which includes using toliet, bedpan, or urinal?: Total Help from another person bathing (including washing, rinsing, drying)?: A Lot Help from another person to put on and taking off regular upper body clothing?: A Lot Help from another person to put on and taking off regular lower body clothing?: Total 6 Click Score: 10   End of Session Nurse Communication: Mobility status  Activity Tolerance: Patient limited by fatigue;Patient limited by pain Patient left: in bed;with call bell/phone within reach;with bed alarm set  OT Visit Diagnosis: Unsteadiness on feet (R26.81);Other abnormalities of gait and mobility (R26.89);Muscle weakness (generalized) (M62.81)                Time: [id number] OT Time Calculation (min): 38 min Charges:  OT General Charges $OT Visit: 1 Visit OT Evaluation $OT Eval Moderate Complexity: 1 Mod OT Treatments $Self  Care/Home Management : 23-37 mins  Laquanta Hummel H., OTR/L Acute Rehabilitation  Vern Guerette Elane Yolanda Bonine 10/27/2020, 6:53 PM

## 2020-10-27 NOTE — Progress Notes (Signed)
Initial Nutrition Assessment  DOCUMENTATION CODES:  Non-severe (moderate) malnutrition in context of chronic illness  INTERVENTION:  Add Glucerna Shake po TID, each supplement provides 220 kcal and 10 grams of protein.  Add 30 ml ProSource Plus po BID, each supplement provides 100 kcal and 15 grams of protein.    Add MVI with minerals daily.  NUTRITION DIAGNOSIS:  Moderate Malnutrition related to chronic illness (CKD) as evidenced by moderate fat depletion, moderate muscle depletion, energy intake < 75% for > or equal to 1 month.  GOAL:  Patient will meet greater than or equal to 90% of their needs  MONITOR:  PO intake, Supplement acceptance, Labs, Weight trends, Skin, I & O's  REASON FOR ASSESSMENT:  Consult Assessment of nutrition requirement/status  ASSESSMENT:  85 yo male with a PMH of Parkinson's disease, RLS, T2DM with nephropathy and neuropathy, CKD stage 3b, PAF, prior urethral stricture s/p dilation + malignant neoplasm of urinary bladder neck, and recurrent UTIs who presents with sepsis 2/2 UTI.  Per Epic, pt ate 75% of breakfast this morning and 50% at lunch today. Pt reports that he has had decreased appetite for about a month now. He has been eating less at home, but did not elaborate on what he had been eating.  Per Epic, pt has lost ~23 lbs (11.6%) in the last 8 months, which is not necessarily significant for the time frame, but it is still concerning.  On exam, pt with moderate depletions throughout his body.  Given above information, pt with moderate malnutrition in the chronic setting.  Recommend adding Glucerna shakes TID and ProSource Plus BID to promote intake, as well as MVI with minerals daily.  Medications: reviewed; SSI, Lantus, thiamine, NaCl @ 75 ml/hr, Zosyn TID via IV  Labs: reviewed; CBG 104-155 HbA1c: 7.2% (10/2020)  NUTRITION - FOCUSED PHYSICAL EXAM: Flowsheet Row Most Recent Value  Orbital Region Moderate depletion  Upper Arm Region  Moderate depletion  Thoracic and Lumbar Region No depletion  Buccal Region Moderate depletion  Temple Region Moderate depletion  Clavicle Bone Region Moderate depletion  Clavicle and Acromion Bone Region Moderate depletion  Scapular Bone Region No depletion  Dorsal Hand Moderate depletion  Patellar Region No depletion  Anterior Thigh Region No depletion  Posterior Calf Region No depletion  Edema (RD Assessment) None  Hair Reviewed  Eyes Reviewed  Mouth Reviewed  Skin Reviewed  Nails Reviewed   Diet Order:   Diet Order             Diet Carb Modified Fluid consistency: Thin; Room service appropriate? Yes  Diet effective now                  EDUCATION NEEDS:  Education needs have been addressed  Skin:  Skin Assessment: Skin Integrity Issues: Skin Integrity Issues:: Other (Comment) Other: MASD - buttocks; Skin tear - L arm  Last BM:  10/26/20 - Type 5, brown/green, small  Height:  Ht Readings from Last 1 Encounters:  10/26/20 '5\' 4"'$  (1.626 m)   Weight:  Wt Readings from Last 1 Encounters:  10/26/20 80 kg   BMI:  Body mass index is 30.27 kg/m.  Estimated Nutritional Needs:  Kcal:  1700-1900 Protein:  85-100 grams Fluid:  >1.7 L  Derrel Nip, RD, LDN (she/her/hers) Registered Dietitian I After-Hours/Weekend Pager # in [place]

## 2020-10-27 NOTE — Consult Note (Signed)
Urology Consult Note   Requesting Attending Physician:  Nita Sells, MD Service Providing Consult: Urology  Consulting Attending: Dr. Nita Sells   Reason for Consult:  Recurrent UTI  HPI: Jason Hartman is seen in consultation for reasons noted above at the request of Nita Sells, MD for evaluation of recurrent UTI with hospitalization.  This is a 85 y.o. 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 Kathrene Alu. He was due for cystoscopic evaluation with Dr. Thomasene Mohair in March 2022. He has been hospitalized multiple times since then with UTI and has required foley catheter placement at least on one occasion during these hospitalizations. He has grown Enterococcus in the past. He was discharged on 10/16/20  after being treated with carbapenems for seven days for ESBL Klebsiella UTI. He folowed up with his urologist on 10/25/20 at which time he was feeling ill with back pain and UA was suspicious for UTI. His PVR in clinic (in care everywhere) was 5cc. He is growing Klebsiella in his urine again. He is currently afebrile and hemodynamically stable. WBC is 10 on 7/21. We are consulted due to non obstructing stones in his kidneys and whether these could be acting as nidus for infection.    Past Medical History: Past Medical History:  Diagnosis Date   Cellulitis and abscess of leg, except foot    Hypertension    Obstructive sleep apnea (adult) (pediatric)    Pain in joint, pelvic region and thigh    Pneumonia, organism unspecified(486)    Restless legs syndrome (RLS)    RLS (restless legs syndrome) 09/01/2012   Thoracic or lumbosacral neuritis or radiculitis, unspecified    Type II or unspecified type diabetes mellitus without mention of complication, not stated as uncontrolled    Unspecified disease of pericardium    Unspecified hereditary and idiopathic peripheral neuropathy      Past Surgical History:  Past Surgical History:  Procedure Laterality Date   ANAL FISSURE REPAIR     pericarditis  2009    Medication: Current Facility-Administered Medications  Medication Dose Route Frequency Provider Last Rate Last Admin   [START ON 10/28/2020] (feeding supplement) PROSource Plus liquid 30 mL  30 mL Oral BID BM Samtani, Jai-Gurmukh, MD       0.9 %  sodium chloride infusion  75 mL/hr Intravenous Continuous Nita Sells, MD 75 mL/hr at 10/27/20 1409 75 mL/hr at 10/27/20 1409   acetaminophen (TYLENOL) tablet 650 mg  650 mg Oral Q6H PRN Toy Baker, MD       Or   acetaminophen (TYLENOL) suppository 650 mg  650 mg Rectal Q6H PRN Doutova, Anastassia, MD       amiodarone (PACERONE) tablet 200 mg  200 mg Oral Daily Doutova, Anastassia, MD   200 mg at 10/27/20 0929   amitriptyline (ELAVIL) tablet 10 mg  10 mg Oral QHS Doutova, Anastassia, MD   10 mg at 10/26/20 2052   apixaban (ELIQUIS) tablet 5 mg  5 mg Oral BID Toy Baker, MD   5 mg at 10/27/20 0929   DULoxetine (CYMBALTA) DR capsule 30 mg  30 mg Oral Daily Nita Sells, MD   30 mg at 10/27/20 0929   feeding supplement (GLUCERNA SHAKE) (GLUCERNA SHAKE) liquid 237 mL  237 mL Oral TID BM Samtani, Jai-Gurmukh, MD       gabapentin (NEURONTIN) capsule 300 mg  300 mg Oral QHS Toy Baker, MD  300 mg at 10/26/20 2051   insulin aspart (novoLOG) injection 0-9 Units  0-9 Units Subcutaneous Q4H Doutova, Anastassia, MD   2 Units at 10/27/20 1410   insulin glargine (LANTUS) injection 8 Units  8 Units Subcutaneous BID Toy Baker, MD   8 Units at 10/27/20 0932   liver oil-zinc oxide (DESITIN) 40 % ointment   Topical BID Nita Sells, MD       metoprolol tartrate (LOPRESSOR) tablet 12.5 mg  12.5 mg Oral BID Doutova, Anastassia, MD   12.5 mg at 10/27/20 [id number]   multivitamin with minerals tablet 1 tablet  1 tablet Oral Daily Nita Sells, MD       piperacillin-tazobactam  (ZOSYN) IVPB 3.375 g  3.375 g Intravenous Q8H Doutova, Anastassia, MD 12.5 mL/hr at 10/27/20 1410 3.375 g at 10/27/20 1410   polyethylene glycol (MIRALAX / GLYCOLAX) packet 17 g  17 g Oral Daily PRN Toy Baker, MD       Rotigotine (Neupro) 8 mg/24 hours patch - patient's own supply  1 patch Transdermal QPM Doutova, Anastassia, MD   1 patch at 10/26/20 2030   thiamine tablet 100 mg  100 mg Oral Daily Doutova, Anastassia, MD   100 mg at 10/27/20 0929   traMADol (ULTRAM) tablet 50 mg  50 mg Oral Q12H PRN Nita Sells, MD   50 mg at 10/27/20 [id number]    Allergies: Allergies  Allergen Reactions   Oxycodone Anaphylaxis    Other reaction(s): Other (See Comments) Cannot recall specifics   Iodinated Diagnostic Agents Hives    alleric to renografin,isovue & omnipaque, hives, requires 13 hr prep//a.calhoun, Onset Date: [id number]      Iodine Hives and Rash    alleric to renografin,isovue & omnipaque, hives, requires 13 hr prep//a.[email or web address], Onset Date: [id number] allergic to renografin,isovue & omnipaque, hives, requires 13 hr prep//a.calhoun, Onset Date: [id number]   Linezolid Hives and Rash        Methadone Hcl Other (See Comments)    HALLUCINATIONS    Promethazine Other (See Comments)    Mental status change, DELIRIUM   Other reaction(s): erratic behavior Other reaction(s): Other (See Comments) Mental status change DELIRIUM (pulled out IV)   Isovue [Iopamidol]    Morphine Sulfate Other (See Comments)    Pt not sure about allergy    Omnipaque [Iohexol] Hives    Code: HIVES, Desc: alleric to renografin,isovue & omnipaque, hives, requires 13 hr prep//a.calhoun, Onset Date: [id number]    Other Hives    Other reaction(s): erratic behanvior Other reaction(s): erratic behavior   Oxycodone Hcl Other (See Comments)    Pt not sure about allergy    Quetiapine Other (See Comments)    Pt not sure about allergy  Other reaction(s): Other (See Comments) Pt not sure about allergy  Pt not  sure about allergy    Renografin [Diatrizoate]    Statins     Other reaction(s): muscle weakness Other reaction(s): muscle weakness   Zolpidem Other (See Comments)    Other reaction(s): erratic behanvior Other reaction(s): Delusions (intolerance) Altered mental status   Atorvastatin Itching and Other (See Comments)    Tired, weakness    Carbidopa-Levodopa Anxiety   Chlorhexidine Gluconate [Chlorhexidine] Hives and Rash   Clindamycin Rash   Colesevelam Other (See Comments)    tired    Doxazosin Rash   Lovastatin Other (See Comments)    Tired, nervousness    Rosuvastatin Other (See Comments)    Tired, weakness     Social History: Social History   Tobacco  Use   Smoking status: Former    Types: Cigarettes    Quit date: 09/01/1980    Years since quitting: 40.1   Smokeless tobacco: Never  Vaping Use   Vaping Use: Never used  Substance Use Topics   Alcohol use: No   Drug use: No    Family History Family History  Problem Relation Age of Onset   Diabetes Father     Review of Systems 10 systems were reviewed and are negative except as noted specifically in the HPI.  Objective   Vital signs in last 24 hours: BP 120/81   Pulse 73   Temp 98.4 F (36.9 C) (Oral)   Resp 16   Ht '5\' 4"'$  (1.626 m)   Wt 80 kg   SpO2 100%   BMI 30.27 kg/m   Physical Exam General: NAD, A&O, resting, appropriate HEENT: [place]/AT, EOMI, MMM Pulmonary: Normal work of breathing Cardiovascular: HDS, adequate peripheral perfusion Abdomen: Soft, NTTP, nondistended GU: Foley in clear place to drainage with clear yellow urine output Extremities: warm and well perfused Neuro: Appropriate, no focal neurological deficits  Most Recent Labs: Lab Results  Component Value Date   WBC 10.3 10/26/2020   HGB 14.0 10/26/2020   HCT 42.9 10/26/2020   PLT 178 10/26/2020    Lab Results  Component Value Date   NA 136 10/26/2020   K 3.8 10/26/2020   CL 105 10/26/2020   CO2 22 10/26/2020   BUN 29  (H) 10/26/2020   CREATININE 1.25 (H) 10/26/2020   CALCIUM 8.7 (L) 10/26/2020   MG 1.7 10/26/2020   PHOS 3.7 10/26/2020    Lab Results  Component Value Date   INR 1.2 09/22/2020   APTT 35 09/22/2020     Urine Culture: '@[id number]'$ (laburin,org,[id number])@   IMAGING: CT ABDOMEN PELVIS WO CONTRAST  Result Date: 10/25/2020 CLINICAL DATA:  Flank pain and hematuria. Patient reports bilateral flank pain. Constipation. EXAM: CT ABDOMEN AND PELVIS WITHOUT CONTRAST TECHNIQUE: Multidetector CT imaging of the abdomen and pelvis was performed following the standard protocol without IV contrast. COMPARISON:  Noncontrast CT 2 weeks ago 10/12/2020 FINDINGS: Lower chest: Mild compressive atelectasis in the right lower lobe related to elevated hemidiaphragm. Coronary artery calcifications/stents. Normal heart size. Hepatobiliary: No focal hepatic abnormality on this unenhanced exam. Gallbladder physiologically distended, no calcified stone. No biliary dilatation. Pancreas: No ductal dilatation or inflammation. Spleen: Upper normal in size spanning 13.2 cm. No focal abnormality. Adrenals/Urinary Tract: No adrenal nodule. There is bilateral renal parenchymal thinning. No hydronephrosis. There are at least 3 small right renal calculi in the upper pole, nonobstructing. No left renal stones. There are bilateral renal cysts. 17 mm likely hyperdense cyst in the lateral mid left kidney, series 3, image 43, unchanged. No ureteral stones. Bladder is minimally distended, however mild perivesicular fat stranding. Mild wall thickening about the posterior bladder base. Stomach/Bowel: The stomach is decompressed. There is no small bowel obstruction or inflammation. Normal appendix. Small to moderate volume of colonic stool. Left colonic diverticulosis. No diverticulitis. No abnormal rectal distension. No pericolonic edema. Vascular/Lymphatic: End-stage aortic atherosclerosis without aneurysm. No enlarged lymph nodes by size  criteria. Reproductive: Prostate is unremarkable. Other: No free air or free fluid. Tiny fat containing umbilical hernia. Calcifications in the anterior omentum likely represent sequela of prior granulomatous disease. Musculoskeletal: L5-S1 degenerative disc disease. There are no acute or suspicious osseous abnormalities. IMPRESSION: 1. Nonobstructing right nephrolithiasis. No hydronephrosis or obstructive uropathy. 2. Mild perivesicular fat stranding, can be seen with urinary tract infection.  Mild wall thickening about the posterior bladder base. 3. Colonic diverticulosis without diverticulitis. Aortic Atherosclerosis (ICD10-I70.0). Electronically Signed   By: Keith Rake M.D.   On: 10/25/2020 21:18    ------  Assessment:  85 y.o. 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 Thomasene Mohair.   Hospitalized multiple times since then with UTI/urosepsis. Now hospitalized again with ESBL Klebsiella UTI.   He is currently afebrile and hemodynamically stable. WBC is 10 on 7/21. We are consulted due to non obstructing stones in his kidneys and whether these could be acting as nidus for infection.   While they may indeed be acting as a nidus, there is no actue indication for intervention for nephrolithiasis as they are not obstructing on his imaging. Additionally, ureteroscopy and laser lithotripsy in setting of active infection would put him at risk for worsening urosepsis.   We recommend checking a PVR to ensure patient is emptying his bladder well since a degree of lower urinary tract obstruction (in setting of bladder neck lesion, parkinsonian neurogenic bladder, urethral stricture) could be a contributing etiology. In this case patient may benefit from lower tract decompression with a foley catheter. For now, we would recommend treating him for a complicated UTI  duration with culture sensitive antibiotics, and for him to follow closely with his urologist for evaluation of his bladder neck and recurrent UTI's as was planned.    Recommendations: - Agree with broad antibiotics tailored per urine culture sensitivities, recommend complicated UTI duration - No acute urologic intervention indicated - Patient should follow with his local urologist for cystoscopy as he has not had surveillance cystoscopy. The lesion at his blader neck may need to be biopsied or resected. Patient can also discuss with his urologist utility of treatment of his non obstructing renal stone burden   Thank you for this consult. Please contact the urology consult pager with any further questions/concerns.

## 2020-10-27 NOTE — Progress Notes (Signed)
Pt very anxious when left alone

## 2020-10-27 NOTE — Progress Notes (Signed)
PROGRESS NOTE   Jason Hartman  [id number] DOB: [birth year] DOA: 10/25/2020 PCP: [name], MD  Brief Narrative:   85 year old white male from [place] Parkinson's disease on rotigotine, RLS, OSA not on BiPAP DM TY 2 with nephropathy neuropathy CKD 3B Recent diagnosis PAF 09/2020 CHADS2 score >3 no AC secondary to urethral bleeding Prior urethral stricture status post dilatation + malignant neoplasm of urinary bladder neck (Dr. Thomasene [facility]air at [facility]) Foley placement and treated for Enterococcus at that time-DC to rehab but had numerous falls subsequently and recently admitted subsequently 7/6-7/02/2021 -Grew out pan-resistant Klebsiella at the time-DC on IV ertapenem via PICC line-last day of therapy was 7/15 per [facility]  He was supposed to follow-up in March for cystoscopy but never returned to their office for work-up and in the interim has had multiple admissions for urinary retention urinary tract infections etc.  Last seen by Retia Passe, [place] with urologist 10/25/2020 and sent to the ED because he was feeling poorly having abdominal pain  coffee-colored urine frequency bilateral flank pain 10/10 constipation obstipation CT abdomen pelvis = nonobstructive right nephrolithiasis without hydronephrosis and perivascular stranding   Hospital-Problem based course  Sepsis on admission secondary to recurrent UTI -Continue Zosyn for empiric coverage, K PNa growing >100,000 CFU--narrow as possible -will need close outpatient follow-up with his urologist Dr. Thomasene Mohair -Continue saline 75 cc an hour for now  Recent onset atrial fibrillation 09/25/2020 CHADS2 score >3 - Amiodarone 200 daily, metoprolol 12.5 twice daily - Keep on telemetry today-periodic magnesium levels  DM TY 2 neuropathy nephropathy a1c 7.2 - Home meds Lantus 8 twice daily, metformin 500 every morning, at this time we have held semaglutide -CBG 100-150 and controlled  Parkinson's disease - Continue rotigotine 8  mg/20 4H patch and outpatient follow-up with specialist - Continue resuming Cymbalta 30 daily, Elavil 10 at bedtime sacral decubitus  Sacral decubitus secondary to immobility -Appreciate wound care instructions, please turn patient every 2 as needed -Apply Desitin to buttocks/sacrum BID and PRN when turning or cleaning.    Moderate malnutrition, BMI 30 -As per nutritionist   DVT prophylaxis: SCD Code Status: Full Family Communication:  Disposition:  Status is: Observation  The patient will require care spanning > 2 midnights and should be moved to inpatient because: Hemodynamically unstable, Persistent severe electrolyte disturbances, Altered mental status, and Ongoing diagnostic testing needed not appropriate for outpatient work up  Dispo: The patient is from: Home              Anticipated d/c is to: SNF              Patient currently is not medically stable to d/c.   Difficult to place patient No  Consultants:  None  Procedures: No  Antimicrobials: Zosyn   Subjective:  "I am depressed" Patient rather despondent-I have explained to him that he probably may need another PICC line despite just being discharged with PICC line antibiotics  He is not sure if he has bladder cancer and asks that I speak to his wife about all things medical he is agreeable to go to rehab  Some fever some discomfort in his sacrum and is trying to move-has not been out of bed today from what I can tell Several stools after giving sorbitol so we narrowed back to just MiraLAX today  Objective: Vitals:   10/27/20 0008 10/27/20 0420 10/27/20 0729 10/27/20 1145  BP: (!) 158/98 127/63 (!) 184/90 137/68  Pulse: 72 68 84 77  Resp: 16  $'15 18 19  'e$ Temp: 97.7 F (36.5 C) 98 F (36.7 C) (!) 97.5 F (36.4 C) 97.8 F (36.6 C)  TempSrc: Oral Oral Oral Oral  SpO2: 99% 97% 98% 95%  Weight:      Height:        Intake/Output Summary (Last 24 hours) at 10/27/2020 1353 Last data filed at 10/27/2020  1053 Gross per 24 hour  Intake 812.56 ml  Output 1800 ml  Net -987.44 ml    Filed Weights   10/26/20 0800  Weight: 80 kg    Examination:  EOMI NCAT flat affect slight wasting temp bitemporally CTA B no rales Abdomen soft nontender Sacral decubiti not examined today No lower extremity edema S1-S2 no murmur  Data Reviewed: personally reviewed   CBC    Component Value Date/Time   WBC 10.3 10/26/2020 0404   RBC 4.85 10/26/2020 0404   HGB 14.0 10/26/2020 0404   HGB 13.2 11/12/2016 1129   HCT 42.9 10/26/2020 0404   HCT 41.9 11/12/2016 1129   PLT 178 10/26/2020 0404   PLT 185 11/12/2016 1129   MCV 88.5 10/26/2020 0404   MCV 83 11/12/2016 1129   MCH 28.9 10/26/2020 0404   MCHC 32.6 10/26/2020 0404   RDW 15.2 10/26/2020 0404   RDW 15.2 11/12/2016 1129   LYMPHSABS 1.3 10/26/2020 0404   LYMPHSABS 1.4 11/12/2016 1129   MONOABS 0.7 10/26/2020 0404   EOSABS 0.3 10/26/2020 0404   EOSABS 0.2 11/12/2016 1129   BASOSABS 0.1 10/26/2020 0404   BASOSABS 0.0 11/12/2016 1129   CMP Latest Ref Rng & Units 10/26/2020 10/25/2020 10/15/2020  Glucose 70 - 99 mg/dL 102(H) 142(H) 206(H)  BUN 8 - 23 mg/dL 29(H) 25(H) 17  Creatinine 0.61 - 1.24 mg/dL 1.25(H) 1.21 1.01  Sodium 135 - 145 mmol/L 136 136 133(L)  Potassium 3.5 - 5.1 mmol/L 3.8 4.3 4.0  Chloride 98 - 111 mmol/L 105 100 100  CO2 22 - 32 mmol/L '22 22 27  '$ Calcium 8.9 - 10.3 mg/dL 8.7(L) 9.5 8.7(L)  Total Protein 6.5 - 8.1 g/dL 5.4(L) 6.7 -  Total Bilirubin 0.3 - 1.2 mg/dL 1.2 0.8 -  Alkaline Phos 38 - 126 U/L 57 68 -  AST 15 - 41 U/L 12(L) 17 -  ALT 0 - 44 U/L 19 26 -     Radiology Studies: CT ABDOMEN PELVIS WO CONTRAST  Result Date: 10/25/2020 CLINICAL DATA:  Flank pain and hematuria. Patient reports bilateral flank pain. Constipation. EXAM: CT ABDOMEN AND PELVIS WITHOUT CONTRAST TECHNIQUE: Multidetector CT imaging of the abdomen and pelvis was performed following the standard protocol without IV contrast. COMPARISON:   Noncontrast CT 2 weeks ago 10/12/2020 FINDINGS: Lower chest: Mild compressive atelectasis in the right lower lobe related to elevated hemidiaphragm. Coronary artery calcifications/stents. Normal heart size. Hepatobiliary: No focal hepatic abnormality on this unenhanced exam. Gallbladder physiologically distended, no calcified stone. No biliary dilatation. Pancreas: No ductal dilatation or inflammation. Spleen: Upper normal in size spanning 13.2 cm. No focal abnormality. Adrenals/Urinary Tract: No adrenal nodule. There is bilateral renal parenchymal thinning. No hydronephrosis. There are at least 3 small right renal calculi in the upper pole, nonobstructing. No left renal stones. There are bilateral renal cysts. 17 mm likely hyperdense cyst in the lateral mid left kidney, series 3, image 43, unchanged. No ureteral stones. Bladder is minimally distended, however mild perivesicular fat stranding. Mild wall thickening about the posterior bladder base. Stomach/Bowel: The stomach is decompressed. There is no small bowel obstruction or inflammation. Normal  appendix. Small to moderate volume of colonic stool. Left colonic diverticulosis. No diverticulitis. No abnormal rectal distension. No pericolonic edema. Vascular/Lymphatic: End-stage aortic atherosclerosis without aneurysm. No enlarged lymph nodes by size criteria. Reproductive: Prostate is unremarkable. Other: No free air or free fluid. Tiny fat containing umbilical hernia. Calcifications in the anterior omentum likely represent sequela of prior granulomatous disease. Musculoskeletal: L5-S1 degenerative disc disease. There are no acute or suspicious osseous abnormalities. IMPRESSION: 1. Nonobstructing right nephrolithiasis. No hydronephrosis or obstructive uropathy. 2. Mild perivesicular fat stranding, can be seen with urinary tract infection. Mild wall thickening about the posterior bladder base. 3. Colonic diverticulosis without diverticulitis. Aortic Atherosclerosis  (ICD10-I70.0). Electronically Signed   By: Keith Rake M.D.   On: 10/25/2020 21:18     Scheduled Meds:  amiodarone  200 mg Oral Daily   amitriptyline  10 mg Oral QHS   apixaban  5 mg Oral BID   docusate sodium  100 mg Oral BID   DULoxetine  30 mg Oral Daily   gabapentin  300 mg Oral QHS   insulin aspart  0-9 Units Subcutaneous Q4H   insulin glargine  8 Units Subcutaneous BID   liver oil-zinc oxide   Topical BID   metoprolol tartrate  12.5 mg Oral BID   Rotigotine  1 patch Transdermal QPM   sorbitol  15 mL Oral Daily   thiamine  100 mg Oral Daily   Continuous Infusions:  sodium chloride 75 mL/hr (10/27/20 0521)   piperacillin-tazobactam (ZOSYN)  IV 3.375 g (10/27/20 0641)     LOS: 1 day   Time spent: [place], MD [facility] To contact the attending provider between 7A-7P or the covering provider during after hours 7P-7A, please log into the web site [email or web address] and access using universal [place] password for that web site. If you do not have the password, please call the hospital operator.  10/27/2020, 1:53 PM

## 2020-10-28 DIAGNOSIS — N390 Urinary tract infection, site not specified: Secondary | ICD-10-CM | POA: Diagnosis not present

## 2020-10-28 DIAGNOSIS — R319 Hematuria, unspecified: Secondary | ICD-10-CM | POA: Diagnosis not present

## 2020-10-28 LAB — RENAL FUNCTION PANEL
Albumin: 2.8 g/dL — ABNORMAL LOW (ref 3.5–5.0)
Anion gap: 7 (ref 5–15)
BUN: 17 mg/dL (ref 8–23)
CO2: 26 mmol/L (ref 22–32)
Calcium: 8.8 mg/dL — ABNORMAL LOW (ref 8.9–10.3)
Chloride: 103 mmol/L (ref 98–111)
Creatinine, Ser: 0.94 mg/dL (ref 0.61–1.24)
GFR, Estimated: >60 mL/min (ref >60)
Glucose, Bld: 107 mg/dL — ABNORMAL HIGH (ref 70–99)
Phosphorus: 3.3 mg/dL (ref 2.5–4.6)
Potassium: 3.8 mmol/L (ref 3.5–5.1)
Sodium: 136 mmol/L (ref 135–145)

## 2020-10-28 LAB — CBC WITH DIFFERENTIAL/PLATELET
Abs Immature Granulocytes: 0.03 10*3/uL (ref 0.00–0.07)
Basophils Absolute: 0.1 10*3/uL (ref 0.0–0.1)
Basophils Relative: 1 %
Eosinophils Absolute: 0.4 10*3/uL (ref 0.0–0.5)
Eosinophils Relative: 5 %
HCT: 42.4 % (ref 39.0–52.0)
Hemoglobin: 13.8 g/dL (ref 13.0–17.0)
Immature Granulocytes: 0 %
Lymphocytes Relative: 19 %
Lymphs Abs: 1.5 10*3/uL (ref 0.7–4.0)
MCH: 28.2 pg (ref 26.0–34.0)
MCHC: 32.5 g/dL (ref 30.0–36.0)
MCV: 86.5 fL (ref 80.0–100.0)
Monocytes Absolute: 0.6 10*3/uL (ref 0.1–1.0)
Monocytes Relative: 8 %
Neutro Abs: 5.6 10*3/uL (ref 1.7–7.7)
Neutrophils Relative %: 67 %
Platelets: 176 10*3/uL (ref 150–400)
RBC: 4.9 MIL/uL (ref 4.22–5.81)
RDW: 15 % (ref 11.5–15.5)
WBC: 8.3 10*3/uL (ref 4.0–10.5)
nRBC: 0 % (ref 0.0–0.2)

## 2020-10-28 LAB — GLUCOSE, CAPILLARY
Glucose-Capillary: 109 mg/dL — ABNORMAL HIGH (ref 70–99)
Glucose-Capillary: 109 mg/dL — ABNORMAL HIGH (ref 70–99)
Glucose-Capillary: 112 mg/dL — ABNORMAL HIGH (ref 70–99)
Glucose-Capillary: 131 mg/dL — ABNORMAL HIGH (ref 70–99)
Glucose-Capillary: 226 mg/dL — ABNORMAL HIGH (ref 70–99)
Glucose-Capillary: 264 mg/dL — ABNORMAL HIGH (ref 70–99)

## 2020-10-28 LAB — URINE CULTURE: Culture: >=100000 — AB

## 2020-10-28 LAB — MAGNESIUM: Magnesium: 1.6 mg/dL — ABNORMAL LOW (ref 1.7–2.4)

## 2020-10-28 MED ORDER — MAGNESIUM SULFATE 2 GM/50ML IV SOLN
2.0000 g | Freq: Once | INTRAVENOUS | Status: AC
  Administered 2020-10-28: 2 g via INTRAVENOUS
  Filled 2020-10-28: qty 50

## 2020-10-28 NOTE — Progress Notes (Signed)
PROGRESS NOTE   Jason Hartman  [id number] DOB: 16-May-1935 DOA: 10/25/2020 PCP: [name], MD  Brief Narrative:   41 WM from [facility] Parkinson's on rotigotine, RLS, OSA not on BiPAP,DM TY 2 with nephropathy neuropathy CKD 3B Recent diagnosis PAF 09/2020 CHADS2 score >3 no AC secondary to urethral bleeding Prior urethral stricture status post dilatation + malignant neoplasm of urinary bladder neck (Dr. Thomasene Mohair at [facility]) Foley placement and treated for Enterococcus at that time-DC to rehab but had numerous falls subsequently and recently admitted subsequently 7/6-7/02/2021 -Grew out pan-resistant Klebsiella at the time-DC on IV ertapenem via PICC line-last day of therapy was 7/15 per [facility]  Told to be seen March for cystoscopy-lost to f/u- has had multiple admissions for urinary retention urinary tract infections etc.  Last seen by Retia Passe, [place] with urologist 10/25/2020 and sent to the ED because he was feeling poorly having abdominal pain  coffee-colored urine frequency bilateral flank pain 10/10 constipation obstipation CT abdomen pelvis = nonobstructive right nephrolithiasis without hydronephrosis and perivascular stranding   Hospital-Problem based course  Sepsis on admission secondary to recurrent UTI Prior ESBL UTI on admission 7/622 -Continue Zosyn for empiric coverage, K PNa growing >100,000 CFU--narrow as possible-May need PICC line replaced in a.m. - Appreciate urology input-no acute urological intervention recommended-post void residuals being checked --Patient has a bladder neck lesion which is unclear in terms of etiology and which may also be a nidus for what is going on-patient to follow-up with Dr. Thomasene Mohair [facility] and I have expressed this to the patient and his wife at the bedside on several days -Saline lock in a.m.  Recent onset atrial fibrillation 09/25/2020 CHADS2 score >3, rate controlled telemetry DC'd - Amiodarone 200 daily, metoprolol  12.5 twice daily  Hypomagnesemia - Give 2 g IV magnesium labs a.m.  DM TY 2 neuropathy nephropathy a1c 7.2 - Home meds Lantus 8 twice daily, metformin 500 every morning, at this time we have held semaglutide -CBG 130-260 today eating 100% of meals  Parkinson's disease - Continue rotigotine 8 mg/20 4H patch and outpatient follow-up with specialist - Continue resuming Cymbalta 30 daily, Elavil 10 at bedtime sacral decubitus  Sacral decubitus secondary to immobility -Appreciate wound care instructions, turn q2 as needed -Apply Desitin to buttocks/sacrum BID and PRN when turning or cleaning.    Moderate malnutrition, BMI 30 -As per nutritionist   DVT prophylaxis: SCD Code Status: Full Family Communication:  Disposition:  Status is: Observation  The patient will require care spanning > 2 midnights and should be moved to inpatient because: Hemodynamically unstable, Persistent severe electrolyte disturbances, Altered mental status, and Ongoing diagnostic testing needed not appropriate for outpatient work up  Dispo: The patient is from: Home              Anticipated d/c is to: SNF              Patient currently is not medically stable to d/c.   Difficult to place patient No  Consultants:  None  Procedures: No  Antimicrobials: Zosyn   Subjective:  Main focus is his increasing constipation although he has had 3 stools He seems quite despondent again today He does not feel strong enough to go back to rehab I explained to him that the goal of going to rehab is to make him stronger eventually His wife joined [place] in the room She understands the next steps in his care  Objective: Vitals:   10/28/20 0010 10/28/20 0433 10/28/20  0900 10/28/20 1237  BP: (!) 151/72 137/66 117/64 (!) 141/61  Pulse: 65 (!) 57 84 68  Resp: '16 16 17 19  '$ Temp: 97.6 F (36.4 C) 97.7 F (36.5 C) 98.1 F (36.7 C) 97.8 F (36.6 C)  TempSrc: Oral Oral Oral Oral  SpO2: 99% 98% 96% 100%  Weight:       Height:        Intake/Output Summary (Last 24 hours) at 10/28/2020 1534 Last data filed at 10/28/2020 1200 Gross per 24 hour  Intake 900 ml  Output 2450 ml  Net -1550 ml    Filed Weights   10/26/20 0800  Weight: 80 kg    Examination:  Flat affect alert coherent Abdomen soft no rebound no guarding Chest clear ROM intact moving 4 limbs equally decubitus not examined S1-S2 sinus rhythm on exam telemetry reviewed no concerning features and discontinue telemetry today   Data Reviewed: personally reviewed   CBC    Component Value Date/Time   WBC 8.3 10/28/2020 0121   RBC 4.90 10/28/2020 0121   HGB 13.8 10/28/2020 0121   HGB 13.2 11/12/2016 1129   HCT 42.4 10/28/2020 0121   HCT 41.9 11/12/2016 1129   PLT 176 10/28/2020 0121   PLT 185 11/12/2016 1129   MCV 86.5 10/28/2020 0121   MCV 83 11/12/2016 1129   MCH 28.2 10/28/2020 0121   MCHC 32.5 10/28/2020 0121   RDW 15.0 10/28/2020 0121   RDW 15.2 11/12/2016 1129   LYMPHSABS 1.5 10/28/2020 0121   LYMPHSABS 1.4 11/12/2016 1129   MONOABS 0.6 10/28/2020 0121   EOSABS 0.4 10/28/2020 0121   EOSABS 0.2 11/12/2016 1129   BASOSABS 0.1 10/28/2020 0121   BASOSABS 0.0 11/12/2016 1129   CMP Latest Ref Rng & Units 10/28/2020 10/26/2020 10/25/2020  Glucose 70 - 99 mg/dL 107(H) 102(H) 142(H)  BUN 8 - 23 mg/dL 17 29(H) 25(H)  Creatinine 0.61 - 1.24 mg/dL 0.94 1.25(H) 1.21  Sodium 135 - 145 mmol/L 136 136 136  Potassium 3.5 - 5.1 mmol/L 3.8 3.8 4.3  Chloride 98 - 111 mmol/L 103 105 100  CO2 22 - 32 mmol/L '26 22 22  '$ Calcium 8.9 - 10.3 mg/dL 8.8(L) 8.7(L) 9.5  Total Protein 6.5 - 8.1 g/dL - 5.4(L) 6.7  Total Bilirubin 0.3 - 1.2 mg/dL - 1.2 0.8  Alkaline Phos 38 - 126 U/L - 57 68  AST 15 - 41 U/L - 12(L) 17  ALT 0 - 44 U/L - 19 26     Radiology Studies: No results found.   Scheduled Meds:  (feeding supplement) PROSource Plus  30 mL Oral BID BM   amiodarone  200 mg Oral Daily   amitriptyline  10 mg Oral QHS   apixaban  5 mg  Oral BID   DULoxetine  30 mg Oral Daily   feeding supplement (GLUCERNA SHAKE)  237 mL Oral TID BM   gabapentin  300 mg Oral QHS   insulin aspart  0-9 Units Subcutaneous Q4H   insulin glargine  8 Units Subcutaneous BID   liver oil-zinc oxide   Topical BID   metoprolol tartrate  12.5 mg Oral BID   multivitamin with minerals  1 tablet Oral Daily   Rotigotine  1 patch Transdermal QPM   thiamine  100 mg Oral Daily   Continuous Infusions:  sodium chloride 75 mL/hr (10/27/20 1409)   magnesium sulfate bolus IVPB     piperacillin-tazobactam (ZOSYN)  IV 3.375 g (10/28/20 1441)     LOS: 2 days  Time spent: [place], MD [facility] To contact the attending provider between 7A-7P or the covering provider during after hours 7P-7A, please log into the web site [email or web address] and access using universal [place] password for that web site. If you do not have the password, please call the hospital operator.  10/28/2020, 3:34 PM

## 2020-10-28 NOTE — NC FL2 (Signed)
[place] LEVEL OF CARE SCREENING TOOL     IDENTIFICATION  Patient Name: Jason Hartman Birthdate: July 06, 1935 Sex: male Admission Date (Current Location): 10/25/2020  [facility] and [place] Number:  Herbalist and Address:  The [place], [place] [address] [place],  [address]      Provider Number: [id number]  Attending Physician Name and Address:  Nita Sells, MD  Relative Name and Phone Number:  Curt Bears spouse [phone]    Current Level of Care: Hospital Recommended Level of Care: [place] Prior Approval Number:    Date Approved/Denied:   PASRR Number: pending  Discharge Plan: SNF    Current Diagnoses: Patient Active Problem List   Diagnosis Date Noted   Malnutrition of moderate degree 10/27/2020   UTI (urinary tract infection) 10/25/2020   Sepsis ([place]) 10/12/2020   AMS (altered mental status)    PAF (paroxysmal atrial fibrillation) ([place])    Acute metabolic encephalopathy [id number]   Anxiety 09/09/2020   SIRS (systemic inflammatory response syndrome) ([place]) 09/09/2020   Diaphoresis    Slow transit constipation 08/07/2020   Abdominal aortic aneurysm without rupture ([place]) 03/17/2020   Abnormal gait 03/17/2020   Ascending aorta dilatation (HCC) 03/17/2020   Diabetic renal disease ([place]) 03/17/2020   Recurrent UTI 07/16/2019   Hypertensive urgency 04/26/2019   Malignant HTN with heart disease, w/o CHF, w/o chronic kidney disease 04/26/2019   Elevated troponin    LFTs abnormal    Statin intolerance 01/15/2019   Penis pain 04/10/2018   Urethra cancer ([place]) 02/25/2018   Lower extremity edema 06/09/2017   Peptic ulcer disease 05/16/2017   GERD (gastroesophageal reflux disease) 05/16/2017   Chronic kidney disease, stage 3a ([place]) 04/11/2017   Lower urinary tract symptoms (LUTS) 04/15/2016   Narcotic dependence ([place]) 08/31/2015   Imbalance 08/31/2015   Diabetic peripheral neuropathy ([place])  08/31/2015   Other malaise and fatigue 05/02/2015   Chronic fatigue 05/02/2015   Panic disorder without agoraphobia 03/22/2015   Hyperlipidemia 02/03/2014   Thrombocytopenia ([place]) 08/18/2013   Parkinsonism ([place]) 08/18/2013   Other disorders of lung 08/18/2013   Organic impotence 08/18/2013   Memory loss 08/18/2013   Low back pain 08/18/2013   Iron deficiency anemia 08/18/2013   Hypercalcemia 08/18/2013   Cellulitis of right leg 08/18/2013   Atherosclerotic heart disease of native coronary artery without angina pectoris 08/18/2013   RLS (restless legs syndrome) 09/01/2012   HERPES SIMPLEX INFECTION 11/06/2006   Type II diabetes mellitus ([place]) 11/06/2006   HYPOGONADISM 11/06/2006   VITAMIN D DEFICIENCY 11/06/2006   ANXIETY 11/06/2006   OBSTRUCTIVE SLEEP APNEA 11/06/2006   RESTLESS LEG SYNDROME 11/06/2006   Essential hypertension 11/06/2006   BENIGN PROSTATIC HYPERTROPHY 11/06/2006   ROSACEA 11/06/2006   OSTEOARTHRITIS 11/06/2006   INSOMNIA 11/06/2006   HYPERGLYCEMIA 11/06/2006   COLONIC POLYPS, HX OF 11/06/2006    Orientation RESPIRATION BLADDER Height & Weight     Self, Time, Situation, Place  Normal Indwelling catheter Weight: 176 lb 5.9 oz (80 kg) Height:  '5\' 4"'$  (162.6 cm)  BEHAVIORAL SYMPTOMS/MOOD NEUROLOGICAL BOWEL NUTRITION STATUS      Continent Diet  AMBULATORY STATUS COMMUNICATION OF NEEDS Skin   Extensive Assist Verbally Other (Comment) (Stage 2 buttock/gluteal foam change every 3 days and PRN)                       Personal Care Assistance Level of Assistance  Bathing, Dressing, Feeding Bathing Assistance: Maximum  assistance Feeding assistance: Limited assistance Dressing Assistance: Maximum assistance     Functional Limitations Info  Sight Sight Info: Adequate        SPECIAL CARE FACTORS FREQUENCY        PT Frequency: 5x a week OT Frequency: 5x a week            Contractures Contractures Info: Not present    Additional Factors Info   Code Status, Allergies Code Status Info: Full Allergies Info: Oxycodone   Iodinated Diagnostic Agents   Iodine   Linezolid   Methadone Hcl   Promethazine   Isovue (Iopamidol)   Morphine Sulfate   Omnipaque (Iohexol)   Other   Oxycodone Hcl   Quetiapine   Renografin (Diatrizoate)   Statins   Zolpidem   Atorvastatin   Carbidopa-levodopa   Chlorhexidine Gluconate (Chlorhexidine)   Clindamycin   Colesevelam   Doxazosin   Lovastatin   Rosuvastatin   Insulin Sliding Scale Info: insulin aspart (novoLOG) 0-20 units TID and bedtime       Current Medications (10/28/2020):  This is the current hospital active medication list Current Facility-Administered Medications  Medication Dose Route Frequency Provider Last Rate Last Admin   (feeding supplement) PROSource Plus liquid 30 mL  30 mL Oral BID BM Samtani, Jai-Gurmukh, MD   30 mL at 10/28/20 1101   0.9 %  sodium chloride infusion  75 mL/hr Intravenous Continuous Nita Sells, MD 75 mL/hr at 10/27/20 1409 75 mL/hr at 10/27/20 1409   acetaminophen (TYLENOL) tablet 650 mg  650 mg Oral Q6H PRN Toy Baker, MD       Or   acetaminophen (TYLENOL) suppository 650 mg  650 mg Rectal Q6H PRN Doutova, Anastassia, MD       amiodarone (PACERONE) tablet 200 mg  200 mg Oral Daily Doutova, Anastassia, MD   200 mg at 10/28/20 1101   amitriptyline (ELAVIL) tablet 10 mg  10 mg Oral QHS Doutova, Anastassia, MD   10 mg at 10/27/20 2109   apixaban (ELIQUIS) tablet 5 mg  5 mg Oral BID Doutova, Anastassia, MD   5 mg at 10/28/20 1101   DULoxetine (CYMBALTA) DR capsule 30 mg  30 mg Oral Daily Nita Sells, MD   30 mg at 10/28/20 1101   feeding supplement (GLUCERNA SHAKE) (GLUCERNA SHAKE) liquid 237 mL  237 mL Oral TID BM Nita Sells, MD   237 mL at 10/27/20 2110   gabapentin (NEURONTIN) capsule 300 mg  300 mg Oral QHS Doutova, Anastassia, MD   300 mg at 10/27/20 2109   insulin aspart (novoLOG) injection 0-9 Units  0-9 Units Subcutaneous Q4H Doutova,  Anastassia, MD   1 Units at 10/28/20 1057   insulin glargine (LANTUS) injection 8 Units  8 Units Subcutaneous BID Toy Baker, MD   8 Units at 10/28/20 1058   liver oil-zinc oxide (DESITIN) 40 % ointment   Topical BID Nita Sells, MD   Given at 10/27/20 2108   metoprolol tartrate (LOPRESSOR) tablet 12.5 mg  12.5 mg Oral BID Toy Baker, MD   12.5 mg at 10/28/20 1101   multivitamin with minerals tablet 1 tablet  1 tablet Oral Daily Nita Sells, MD   1 tablet at 10/28/20 1101   piperacillin-tazobactam (ZOSYN) IVPB 3.375 g  3.375 g Intravenous Q8H Doutova, Anastassia, MD 12.5 mL/hr at 10/28/20 0608 3.375 g at 10/28/20 [id number]   polyethylene glycol (MIRALAX / GLYCOLAX) packet 17 g  17 g Oral Daily PRN Toy Baker, MD  Rotigotine (Neupro) 8 mg/24 hours patch - patient's own supply  1 patch Transdermal QPM Doutova, Anastassia, MD   1 patch at 10/27/20 1827   thiamine tablet 100 mg  100 mg Oral Daily Doutova, Anastassia, MD   100 mg at 10/28/20 1101   traMADol (ULTRAM) tablet 50 mg  50 mg Oral Q12H PRN Nita Sells, MD   50 mg at 10/27/20 2110     Discharge Medications: Please see discharge summary for a list of discharge medications.  Relevant Imaging Results:  Relevant Lab Results:   Additional Information [place], [place]

## 2020-10-28 NOTE — Progress Notes (Signed)
Pt had 2 BM yesterday on dayshift and now complains of feeling constipated. Reminded him that he did have 2 BM yesterday.

## 2020-10-28 NOTE — TOC Initial Note (Addendum)
Transition of Care [facility]) - Initial/Assessment Note    Patient Details  Name: Jason Hartman MRN: [id number] Date of Birth: 1936-03-13  Transition of Care [facility]) CM/SW Contact:    Emeterio Reeve, LCSW Phone Number: 10/28/2020, 12:15 PM  Clinical Narrative:                  CSW received SNF consult. CSW spoke to pts wife Jason Hartman on the phone. CSW introduced self and explained role at the hospital. Wife reports pt has been at [facility] since last admission and she would like for him to return back at discharge. CSW left message with admissions coordinator. Pt is covid vaccinated with booster. CSW was unable to get Pasrr number because [name] his it open on their end.    CSW will continue to follow.   Expected Discharge Plan: Skilled Nursing Facility Barriers to Discharge: Continued Medical Work up   Patient Goals and CMS Choice Patient states their goals for this hospitalization and ongoing recovery are:: to get better CMS Medicare.gov Compare Post Acute Care list provided to:: Patient Choice offered to / list presented to : Patient  Expected Discharge Plan and Services Expected Discharge Plan: [place] arrangements for the past 2 months: [place]                                      Prior Living Arrangements/Services Living arrangements for the past 2 months: [place] Lives with:: Facility Resident Patient language and need for interpreter reviewed:: Yes Do you feel safe going back to the place where you live?: Yes      Need for Family Participation in Patient Care: Yes (Comment) Care giver support system in place?: Yes (comment)   Criminal Activity/Legal Involvement Pertinent to Current Situation/Hospitalization: No - Comment as needed  Activities of Daily Living      Permission Sought/Granted Permission sought to share information with : Facility Sport and exercise psychologist, Family  Supports Permission granted to share information with : Yes, Verbal Permission Granted     Permission granted to share info w AGENCY: SNF  Permission granted to share info w Relationship: Wife     Emotional Assessment Appearance:: Appears stated age Attitude/Demeanor/Rapport: Other (comment), Unable to Assess Affect (typically observed): Other (comment), Unable to Assess Orientation: : Oriented to Self, Oriented to Place Alcohol / Substance Use: Not Applicable Psych Involvement: No (comment)  Admission diagnosis:  UTI (urinary tract infection) [N39.0] Sepsis ([place]) [A41.9] Urinary tract infection with hematuria, site unspecified [N39.0, R31.9] Patient Active Problem List   Diagnosis Date Noted   Malnutrition of moderate degree 10/27/2020   UTI (urinary tract infection) 10/25/2020   Sepsis ([place]) 10/12/2020   AMS (altered mental status)    PAF (paroxysmal atrial fibrillation) ([place])    Acute metabolic encephalopathy [id number]   Anxiety 09/09/2020   SIRS (systemic inflammatory response syndrome) ([place]) 09/09/2020   Diaphoresis    Slow transit constipation 08/07/2020   Abdominal aortic aneurysm without rupture ([place]) 03/17/2020   Abnormal gait 03/17/2020   Ascending aorta dilatation ([place]) 03/17/2020   Diabetic renal disease ([place]) 03/17/2020   Recurrent UTI 07/16/2019   Hypertensive urgency 04/26/2019   Malignant HTN with heart disease, w/o CHF, w/o chronic kidney disease 04/26/2019   Elevated troponin    LFTs abnormal    Statin intolerance 01/15/2019   Penis pain 04/10/2018  Urethra cancer ([place]) 02/25/2018   Lower extremity edema 06/09/2017   Peptic ulcer disease 05/16/2017   GERD (gastroesophageal reflux disease) 05/16/2017   Chronic kidney disease, stage 3a ([place]) 04/11/2017   Lower urinary tract symptoms (LUTS) 04/15/2016   Narcotic dependence ([place]) 08/31/2015   Imbalance 08/31/2015   Diabetic peripheral neuropathy ([place]) 08/31/2015   Other malaise and fatigue 05/02/2015    Chronic fatigue 05/02/2015   Panic disorder without agoraphobia 03/22/2015   Hyperlipidemia 02/03/2014   Thrombocytopenia ([place]) 08/18/2013   Parkinsonism ([place]) 08/18/2013   Other disorders of lung 08/18/2013   Organic impotence 08/18/2013   Memory loss 08/18/2013   Low back pain 08/18/2013   Iron deficiency anemia 08/18/2013   Hypercalcemia 08/18/2013   Cellulitis of right leg 08/18/2013   Atherosclerotic heart disease of native coronary artery without angina pectoris 08/18/2013   RLS (restless legs syndrome) 09/01/2012   HERPES SIMPLEX INFECTION 11/06/2006   Type II diabetes mellitus ([place]) 11/06/2006   HYPOGONADISM 11/06/2006   VITAMIN D DEFICIENCY 11/06/2006   ANXIETY 11/06/2006   OBSTRUCTIVE SLEEP APNEA 11/06/2006   RESTLESS LEG SYNDROME 11/06/2006   Essential hypertension 11/06/2006   BENIGN PROSTATIC HYPERTROPHY 11/06/2006   ROSACEA 11/06/2006   OSTEOARTHRITIS 11/06/2006   INSOMNIA 11/06/2006   HYPERGLYCEMIA 11/06/2006   COLONIC POLYPS, HX OF 11/06/2006   PCP:  [name], MD Pharmacy:   [facility] - [place], [place] - [address] [address] [address] [place] [address] Phone: [phone] Fax: [facility] [phone] - [place], [place] - [address] AT [place] [address] [place] [address] Phone: [phone] Fax: [phone]     Social Determinants of Health (SDOH) Interventions    Readmission Risk Interventions No flowsheet data found.  Emeterio Reeve, Latanya Presser, [place] Social Worker [phone]

## 2020-10-29 ENCOUNTER — Inpatient Hospital Stay: Payer: Self-pay

## 2020-10-29 DIAGNOSIS — R319 Hematuria, unspecified: Secondary | ICD-10-CM | POA: Diagnosis not present

## 2020-10-29 DIAGNOSIS — N390 Urinary tract infection, site not specified: Secondary | ICD-10-CM | POA: Diagnosis not present

## 2020-10-29 LAB — COMPREHENSIVE METABOLIC PANEL
ALT: 20 U/L (ref 0–44)
AST: 14 U/L — ABNORMAL LOW (ref 15–41)
Albumin: 2.7 g/dL — ABNORMAL LOW (ref 3.5–5.0)
Alkaline Phosphatase: 54 U/L (ref 38–126)
Anion gap: 7 (ref 5–15)
BUN: 14 mg/dL (ref 8–23)
CO2: 26 mmol/L (ref 22–32)
Calcium: 8.7 mg/dL — ABNORMAL LOW (ref 8.9–10.3)
Chloride: 103 mmol/L (ref 98–111)
Creatinine, Ser: 0.9 mg/dL (ref 0.61–1.24)
GFR, Estimated: >60 mL/min (ref >60)
Glucose, Bld: 170 mg/dL — ABNORMAL HIGH (ref 70–99)
Potassium: 3.5 mmol/L (ref 3.5–5.1)
Sodium: 136 mmol/L (ref 135–145)
Total Bilirubin: 0.7 mg/dL (ref 0.3–1.2)
Total Protein: 5.6 g/dL — ABNORMAL LOW (ref 6.5–8.1)

## 2020-10-29 LAB — GLUCOSE, CAPILLARY
Glucose-Capillary: 114 mg/dL — ABNORMAL HIGH (ref 70–99)
Glucose-Capillary: 124 mg/dL — ABNORMAL HIGH (ref 70–99)
Glucose-Capillary: 192 mg/dL — ABNORMAL HIGH (ref 70–99)
Glucose-Capillary: 221 mg/dL — ABNORMAL HIGH (ref 70–99)
Glucose-Capillary: 242 mg/dL — ABNORMAL HIGH (ref 70–99)
Glucose-Capillary: 321 mg/dL — ABNORMAL HIGH (ref 70–99)

## 2020-10-29 LAB — MAGNESIUM: Magnesium: 1.8 mg/dL (ref 1.7–2.4)

## 2020-10-29 MED ORDER — DULOXETINE HCL 30 MG PO CPEP: 30.0000 mg | ORAL_CAPSULE | Freq: Every day | ORAL | 0 refills | Status: DC

## 2020-10-29 MED ORDER — SODIUM CHLORIDE 0.9 % IV SOLN
1.0000 g | INTRAVENOUS | Status: DC
  Administered 2020-10-29 – 2020-10-30 (×2): 1000 mg via INTRAVENOUS
  Filled 2020-10-29 (×3): qty 1

## 2020-10-29 MED ORDER — TRAMADOL HCL 50 MG PO TABS: 50.0000 mg | ORAL_TABLET | Freq: Every day | ORAL | 0 refills | Status: DC

## 2020-10-29 MED ORDER — SODIUM CHLORIDE 0.9% FLUSH: 10.0000 mL | INTRAVENOUS | Status: DC | PRN

## 2020-10-29 MED ORDER — AMITRIPTYLINE HCL 10 MG PO TABS: 10.0000 mg | ORAL_TABLET | Freq: Every day | ORAL | 0 refills | Status: DC

## 2020-10-29 MED ORDER — SODIUM CHLORIDE 0.9% FLUSH
10.0000 mL | Freq: Two times a day (BID) | INTRAVENOUS | Status: DC
  Administered 2020-10-29 – 2020-10-30 (×2): 10 mL

## 2020-10-29 NOTE — Progress Notes (Addendum)
Peripherally Inserted Central Catheter Placement  The IV Nurse has discussed with the patient and/or persons authorized to consent for the patient, the purpose of this procedure and the potential benefits and risks involved with this procedure.  The benefits include less needle sticks, lab draws from the catheter, and the patient may be discharged home with the catheter. Risks include, but not limited to, infection, bleeding, blood clot (thrombus formation), and puncture of an artery; nerve damage and irregular heartbeat and possibility to perform a PICC exchange if needed/ordered by physician.  Alternatives to this procedure were also discussed.  Bard Power PICC patient education guide, fact sheet on infection prevention and patient information card has been provided to patient /or left at bedside.  Wife at bedside gave consent.  PICC Placement Documentation  PICC Single Lumen 10/29/20 Right Brachial 0 cm (Active)  Indication for Insertion or Continuance of Line Home intravenous therapies (PICC only) 10/29/20 1735  Exposed Catheter (cm) 0 cm 10/29/20 1735  Site Assessment Clean;Dry;Intact 10/29/20 1735  Line Status Flushed;Saline locked;Blood return noted 10/29/20 1735  Dressing Type Transparent 10/29/20 1735  Dressing Status Clean;Dry;Intact 10/29/20 1735  Antimicrobial disc in place? Yes 10/29/20 1735  Safety Lock Not Applicable [id number]  Line Care Tubing changed;Connections checked and tightened 10/29/20 1735  Line Adjustment (NICU/IV Team Only) No 10/29/20 1735  Dressing Intervention New dressing 10/29/20 1735  Dressing Change Due 11/05/20 10/29/20 1735       Rolena Infante 10/29/2020, 5:36 PM

## 2020-10-29 NOTE — Progress Notes (Signed)
Jaimie RN notified via securechat that the PICC is ready to use and remove the PIV.

## 2020-10-29 NOTE — Progress Notes (Signed)
Patient seen and examined and discussed with his wife in detail on the phone He is despondent but is stable I have completed his discharge summary he will get a COVID test and a PICC line prior to discharge hopefully back to skilled facility on 725

## 2020-10-29 NOTE — Progress Notes (Addendum)
PICC line in place. COVID swab complete, pending results.

## 2020-10-29 NOTE — Discharge Summary (Addendum)
Physician Discharge Summary  DAESEAN LAZARZ HKV:[id number] DOB: 08/22/1935 DOA: 10/25/2020  PCP: [name], MD  Admit date: 10/25/2020 Discharge date: 10/29/2020  Time spent: 37 minutes  Recommendations for Outpatient Follow-up:  Complete 7 more days of ertapenem for ESBL Klebsiella urinary infection Patient to be scheduled by skilled physician for outpatient follow-up with Dr. Doreen Beam [facility] urology for consideration of cystoscopy and discussion about next steps regarding  1] cystoscopy 2] what to do about suppressive therapy for UTIs Requires Chem-12 CBC in about 1 week Please screen for depression and augment therapy and suggest counseling for the patient--at previous admission recently he was tapered off of BZD's successfully and placed on higher doses of his SSRIs Please consider keeping PICC line in place for about 1 week post treatment with ertapenem given his recurrence and or until he follows up with urologist  Discharge Diagnoses:  MAIN problem for hospitalization   Recurrent ESBL UTI with sepsis on admission  Please see below for itemized issues addressed in [facility]- refer to other progress notes for clarity if needed  Discharge Condition: Improved  Diet recommendation:   Diabetic heart healthy  Filed Weights   10/26/20 0800  Weight: 80 kg    History of present illness:  84 WM from [facility] Parkinson's on rotigotine, RLS, OSA not on BiPAP,DM TY 2 with nephropathy neuropathy CKD 3B Prior urethral stricture status post dilatation + malignant neoplasm of urinary bladder neck (Dr. Thomasene Mohair at [facility])   Was to be seen March for cystoscopy-lost to f/u- has had multiple admissions for urinary retention urinary tract infections etc.  Recent diagnosis,+ new PAF 09/2020 CHADS2 score >3 no AC secondary [previously] 2/2 to urethral bleeding--Foley placement and treated for Enterococcus at that time- DC to rehab but had numerous falls subsequently and recently  admitted subsequently 7/6-7/02/2021-- admit  for encephalopathy -Grew out pan-resistant Klebsiella at the time-DC on IV ertapenem via PICC line-last day of therapy was 7/15 per [facility]   Last seen by Retia Passe, [place] with urologist 10/25/2020 and sent to the ED because he was feeling poorly having abdominal pain  coffee-colored urine frequency bilateral flank pain 10/10 constipation obstipation CT abdomen pelvis = nonobstructive right nephrolithiasis without hydronephrosis and perivascular stranding   Readmitted for probable UTI as below  Hospital Course:  Sepsis on admission secondary to recurrent UTI ESBL Klebsiella in UTI confirmed again  >100,000 CFU -Discussed with Dr. Linus Salmons ID and will need ertapenem again in 7 days keep PICC line in beyond this suggest for at least a week given the recurrence -urologist saw the patient on 7/23- - no acute urological intervention recommended-post void residuals being checked --Patient has a bladder neck lesion which is unclear in terms of etiology and which may also be a nidus for what is going on- -Patient to follow-up with Dr. Doreen Beam [facility] and I have expressed this to the patient and his wife at the bedside on several days   Recent onset atrial fibrillation 09/25/2020 CHADS2 score >3 , rate controlled telemetry DC'd - Amiodarone 200 daily, metoprolol 12.5 twice daily -Continue Eliquis 5 twice daily cautiously given his bladder neck lesion   Hypomagnesemia - Give 2 g IV magnesium labs a.m. -Outpatient periodic rechecks   DM TY 2 neuropathy nephropathy a1c 7.2 - Home meds Lantus 8 twice daily, metformin 500 every morning -Semaglutide to be resumed on discharge -CBG moderately controlled range and may need titration in the outpatient setting today eating 100% of meals  Parkinson's disease  Underlying moderate depression - Continue rotigotine 8 mg/20 4H patch and outpatient follow-up with specialist - Continue resuming Cymbalta 30 daily,  Elavil 10 at bedtime  -BZD's discontinued last hospital stay hesitate to use Augment with either T3, lithium and or may trial addition of BuSpar in the outpatient setting A lot of this is situational-never has really been really "sick" and his wife relates that he is always had a good attitude-he will need counseling which is what I think will help him the most with this  Sacral decubitus secondary to immobility -Appreciate wound care instructions, turn q2 as needed -Apply Desitin to buttocks/sacrum BID and PRN when turning or cleaning.    Moderate malnutrition, BMI 30 -As per nutritionist  Procedures: Multiple scans Consultations: Urologist Dr. Diona Fanti and resident urologist Dr. Danise Edge  Discharge Exam: Vitals:   10/29/20 0820 10/29/20 1215  BP: (!) 127/58 (!) 142/69  Pulse: 61 84  Resp: 18 18  Temp: 97.7 F (36.5 C) 97.9 F (36.6 C)  SpO2: 98%     Subj on day of d/c    Emotional no fever  General Exam on discharge  EOMI NCAT no focal deficit Rather emotional Chest clear no added sound rales rhonchi Abdomen soft No lower extremity edema ROM intact  Discharge Instructions    Allergies as of 10/29/2020       Reactions   Oxycodone Anaphylaxis   Other reaction(s): Other (See Comments) Cannot recall specifics   Iodinated Diagnostic Agents Hives   alleric to renografin,isovue & omnipaque, hives, requires 13 hr prep//a.calhoun, Onset Date: [id number]   Iodine Hives, Rash   alleric to renografin,isovue & omnipaque, hives, requires 13 hr prep//a.calhoun, Onset Date: [id number] allergic to renografin,isovue & omnipaque, hives, requires 13 hr prep//a.calhoun, Onset Date: [id number]   Linezolid Hives, Rash      Methadone Hcl Other (See Comments)   HALLUCINATIONS   Promethazine Other (See Comments)   Mental status change, DELIRIUM Other reaction(s): erratic behavior Other reaction(s): Other (See Comments) Mental status change DELIRIUM (pulled out IV)   Isovue  [iopamidol]    Morphine Sulfate Other (See Comments)   Pt not sure about allergy    Omnipaque [iohexol] Hives   Code: HIVES, Desc: alleric to renografin,isovue & omnipaque, hives, requires 13 hr prep//a.calhoun, Onset Date: [id number]   Other Hives   Other reaction(s): erratic behanvior Other reaction(s): erratic behavior   Oxycodone Hcl Other (See Comments)   Pt not sure about allergy    Quetiapine Other (See Comments)   Pt not sure about allergy  Other reaction(s): Other (See Comments) Pt not sure about allergy  Pt not sure about allergy    Renografin [diatrizoate]    Statins    Other reaction(s): muscle weakness Other reaction(s): muscle weakness   Zolpidem Other (See Comments)   Other reaction(s): erratic behanvior Other reaction(s): Delusions (intolerance) Altered mental status   Atorvastatin Itching, Other (See Comments)   Tired, weakness   Carbidopa-levodopa Anxiety   Chlorhexidine Gluconate [chlorhexidine] Hives, Rash   Clindamycin Rash   Colesevelam Other (See Comments)   tired   Doxazosin Rash   Lovastatin Other (See Comments)   Tired, nervousness   Rosuvastatin Other (See Comments)   Tired, weakness        Medication List     STOP taking these medications    BD Pen Needle Nano 2nd Gen 32G X 4 MM Misc Generic drug: Insulin Pen Needle   diphenhydrAMINE 25 MG tablet Commonly known as: BENADRYL   eucerin lotion  fluticasone 50 MCG/ACT nasal spray Commonly known as: FLONASE   hydrocortisone 1 % lotion   loperamide 2 MG tablet Commonly known as: IMODIUM A-D   LORazepam 0.5 MG tablet Commonly known as: ATIVAN   tadalafil 5 MG tablet Commonly known as: CIALIS       TAKE these medications    acetaminophen 500 MG tablet Commonly known as: TYLENOL Take 500 mg by mouth 3 (three) times daily.   amiodarone 200 MG tablet Commonly known as: PACERONE Take 1 tablet (200 mg total) by mouth daily.   amitriptyline 10 MG tablet Commonly known as:  ELAVIL Take 1 tablet (10 mg total) by mouth at bedtime.   apixaban 5 MG Tabs tablet Commonly known as: ELIQUIS Take 1 tablet (5 mg total) by mouth 2 (two) times daily.   DULoxetine 30 MG capsule Commonly known as: CYMBALTA Take 1 capsule (30 mg total) by mouth daily.   ertapenem  IVPB Commonly known as: INVANZ Inject 1 g into the vein daily. Indication:  ESBL Kleb pneumo UTI First Dose: Yes Last Day of Therapy:  10/20/20 Labs - Once weekly:  CBC/D and BMP, Labs - Every other week:  ESR and CRP Method of administration: Mini-Bag Plus / Gravity Method of administration may be changed at the discretion of home infusion pharmacist based upon assessment of the patient and/or caregiver's ability to self-administer the medication ordered.   ferrous sulfate 325 (65 FE) MG EC tablet Take 325 mg by mouth daily.   gabapentin 300 MG capsule Commonly known as: NEURONTIN Take 1 capsule (300 mg total) by mouth at bedtime.   Lantus SoloStar 100 UNIT/ML Solostar Pen Generic drug: insulin glargine Inject 8 Units into the skin 2 (two) times daily.   melatonin 5 MG Tabs Take 5 mg by mouth at bedtime.   metFORMIN 500 MG 24 hr tablet Commonly known as: GLUCOPHAGE-XR Take 500 mg by mouth daily with breakfast.   metoprolol tartrate 25 MG tablet Commonly known as: LOPRESSOR Take 0.5 tablets (12.5 mg total) by mouth 2 (two) times daily.   Neupro 8 MG/24HR Pt24 Generic drug: Rotigotine Place 1 patch onto the skin every evening.   Nyamyc powder Generic drug: nystatin Apply 1 application topically in the morning and at bedtime. To rash on back and groin and sacrum   Ozempic (0.25 or 0.5 MG/DOSE) 2 MG/1.5ML Sopn Generic drug: Semaglutide(0.25 or 0.5MG/DOS) Inject 0.5 mg into the skin every Tuesday.   polyethylene glycol powder 17 GM/SCOOP powder Commonly known as: GLYCOLAX/MIRALAX Take 17 g by mouth daily.   senna-docusate 8.6-50 MG tablet Commonly known as: Senokot-S Take 1 tablet by  mouth 2 (two) times daily as needed for moderate constipation.   thiamine 100 MG tablet Take 1 tablet (100 mg total) by mouth daily.   traMADol 50 MG tablet Commonly known as: ULTRAM Take 1 tablet (50 mg total) by mouth daily.       Allergies  Allergen Reactions   Oxycodone Anaphylaxis    Other reaction(s): Other (See Comments) Cannot recall specifics   Iodinated Diagnostic Agents Hives    alleric to renografin,isovue & omnipaque, hives, requires 13 hr prep//a.calhoun, Onset Date: [id number]      Iodine Hives and Rash    alleric to renografin,isovue & omnipaque, hives, requires 13 hr prep//a.calhoun, Onset Date: [id number] allergic to renografin,isovue & omnipaque, hives, requires 13 hr prep//a.calhoun, Onset Date: [id number]   Linezolid Hives and Rash        Methadone Hcl Other (See Comments)  HALLUCINATIONS    Promethazine Other (See Comments)    Mental status change, DELIRIUM   Other reaction(s): erratic behavior Other reaction(s): Other (See Comments) Mental status change DELIRIUM (pulled out IV)   Isovue [Iopamidol]    Morphine Sulfate Other (See Comments)    Pt not sure about allergy    Omnipaque [Iohexol] Hives    Code: HIVES, Desc: alleric to renografin,isovue & omnipaque, hives, requires 13 hr prep//a.calhoun, Onset Date: [id number]    Other Hives    Other reaction(s): erratic behanvior Other reaction(s): erratic behavior   Oxycodone Hcl Other (See Comments)    Pt not sure about allergy    Quetiapine Other (See Comments)    Pt not sure about allergy  Other reaction(s): Other (See Comments) Pt not sure about allergy  Pt not sure about allergy    Renografin [Diatrizoate]    Statins     Other reaction(s): muscle weakness Other reaction(s): muscle weakness   Zolpidem Other (See Comments)    Other reaction(s): erratic behanvior Other reaction(s): Delusions (intolerance) Altered mental status   Atorvastatin Itching and Other (See Comments)    Tired,  weakness    Carbidopa-Levodopa Anxiety   Chlorhexidine Gluconate [Chlorhexidine] Hives and Rash   Clindamycin Rash   Colesevelam Other (See Comments)    tired    Doxazosin Rash   Lovastatin Other (See Comments)    Tired, nervousness    Rosuvastatin Other (See Comments)    Tired, weakness       The results of significant diagnostics from this hospitalization (including imaging, microbiology, ancillary and laboratory) are listed below for reference.    Significant Diagnostic Studies: CT ABDOMEN PELVIS WO CONTRAST  Result Date: 10/25/2020 CLINICAL DATA:  Flank pain and hematuria. Patient reports bilateral flank pain. Constipation. EXAM: CT ABDOMEN AND PELVIS WITHOUT CONTRAST TECHNIQUE: Multidetector CT imaging of the abdomen and pelvis was performed following the standard protocol without IV contrast. COMPARISON:  Noncontrast CT 2 weeks ago 10/12/2020 FINDINGS: Lower chest: Mild compressive atelectasis in the right lower lobe related to elevated hemidiaphragm. Coronary artery calcifications/stents. Normal heart size. Hepatobiliary: No focal hepatic abnormality on this unenhanced exam. Gallbladder physiologically distended, no calcified stone. No biliary dilatation. Pancreas: No ductal dilatation or inflammation. Spleen: Upper normal in size spanning 13.2 cm. No focal abnormality. Adrenals/Urinary Tract: No adrenal nodule. There is bilateral renal parenchymal thinning. No hydronephrosis. There are at least 3 small right renal calculi in the upper pole, nonobstructing. No left renal stones. There are bilateral renal cysts. 17 mm likely hyperdense cyst in the lateral mid left kidney, series 3, image 43, unchanged. No ureteral stones. Bladder is minimally distended, however mild perivesicular fat stranding. Mild wall thickening about the posterior bladder base. Stomach/Bowel: The stomach is decompressed. There is no small bowel obstruction or inflammation. Normal appendix. Small to moderate volume  of colonic stool. Left colonic diverticulosis. No diverticulitis. No abnormal rectal distension. No pericolonic edema. Vascular/Lymphatic: End-stage aortic atherosclerosis without aneurysm. No enlarged lymph nodes by size criteria. Reproductive: Prostate is unremarkable. Other: No free air or free fluid. Tiny fat containing umbilical hernia. Calcifications in the anterior omentum likely represent sequela of prior granulomatous disease. Musculoskeletal: L5-S1 degenerative disc disease. There are no acute or suspicious osseous abnormalities. IMPRESSION: 1. Nonobstructing right nephrolithiasis. No hydronephrosis or obstructive uropathy. 2. Mild perivesicular fat stranding, can be seen with urinary tract infection. Mild wall thickening about the posterior bladder base. 3. Colonic diverticulosis without diverticulitis. Aortic Atherosclerosis (ICD10-I70.0). Electronically Signed   By: Keith Rake  M.D.   On: 10/25/2020 21:18   DG Thoracic Spine 2 View  Result Date: 10/11/2020 CLINICAL DATA:  Back pain, fall EXAM: THORACIC SPINE 2 VIEWS COMPARISON:  None. FINDINGS: Thoracic alignment within normal limits. Vertebral body heights are maintained. Mild degenerative osteophytes. IMPRESSION: No acute osseous abnormality Electronically Signed   By: Donavan Foil M.D.   On: 10/11/2020 21:51   DG Lumbar Spine Complete  Result Date: 10/11/2020 CLINICAL DATA:  Back pain, fall EXAM: LUMBAR SPINE - COMPLETE 4+ VIEW COMPARISON:  10/06/2020 FINDINGS: Stable lumbar alignment. Trace anterolisthesis L4 on L5. Vertebral body heights are maintained. Moderate disc space narrowing and degenerative change at L5-S1. Aortic atherosclerosis. Oval calcification is seen in left upper quadrant on one view only IMPRESSION: No acute osseous abnormality Electronically Signed   By: Donavan Foil M.D.   On: 10/11/2020 21:50   DG Lumbar Spine Complete  Result Date: 10/06/2020 CLINICAL DATA:  Fall, low back pain on the right side EXAM: LUMBAR  SPINE - COMPLETE 4+ VIEW COMPARISON:  09/29/2020 FINDINGS: Vascular calcifications noted. A catheter projects over the lower pelvis. Spurring of the SI joints noted. Degenerative facet arthropathy bilaterally at L4-5 and L5-S1. Prominent space in the facet joints at L4-5 may reflect facet joint effusions. Loss of intervertebral disc height at L5-S1, similar to prior, with endplate sclerosis which is likely degenerative. Stable 3 mm degenerative anterolisthesis at L4-5. No acute fracture is identified. Aortoiliac atherosclerotic vascular disease. IMPRESSION: 1. Stable lower lumbar spondylosis and degenerative disc disease. No acute findings. 2. Grade 1 degenerative anterolisthesis at L4-5, stable. 3.  Aortic Atherosclerosis (ICD10-I70.0). Electronically Signed   By: Van Clines M.D.   On: 10/06/2020 13:24   DG Knee 2 Views Left  Result Date: 10/11/2020 CLINICAL DATA:  Fall, bilateral knee pain. EXAM: LEFT KNEE - 1-2 VIEW COMPARISON:  None. FINDINGS: No evidence of fracture, dislocation, or joint effusion. Normal joint spaces. Advanced vascular calcifications. Soft tissues are unremarkable. IMPRESSION: 1. No fracture or subluxation of the left knee. 2. Advanced vascular calcifications. Electronically Signed   By: Keith Rake M.D.   On: 10/11/2020 20:44   DG Knee 2 Views Right  Result Date: 10/11/2020 CLINICAL DATA:  Post fall with bilateral knee pain. EXAM: RIGHT KNEE - 1-2 VIEW COMPARISON:  None. FINDINGS: No evidence of fracture, dislocation, or joint effusion. Normal joint spaces. Prominent vascular calcifications. Soft tissues are unremarkable. IMPRESSION: 1. No fracture or subluxation of the right knee. 2. Prominent vascular calcifications. Electronically Signed   By: Keith Rake M.D.   On: 10/11/2020 20:43   CT Head Wo Contrast  Result Date: 10/11/2020 CLINICAL DATA:  Fall EXAM: CT HEAD WITHOUT CONTRAST CT CERVICAL SPINE WITHOUT CONTRAST TECHNIQUE: Multidetector CT imaging of the head  and cervical spine was performed following the standard protocol without intravenous contrast. Multiplanar CT image reconstructions of the cervical spine were also generated. COMPARISON:  10/06/2020 head CT and cervical spine CT FINDINGS: CT HEAD FINDINGS Brain: There is no mass, hemorrhage or extra-axial collection. There is generalized atrophy without lobar predilection. There is hypoattenuation of the periventricular white matter, most commonly indicating chronic ischemic microangiopathy. Vascular: Atherosclerotic calcification of the internal carotid arteries at the skull base. No abnormal hyperdensity of the major intracranial arteries or dural venous sinuses. Skull: The visualized skull base, calvarium and extracranial soft tissues are normal. Sinuses/Orbits: No fluid levels or advanced mucosal thickening of the visualized paranasal sinuses. No mastoid or middle ear effusion. The orbits are normal. CT CERVICAL SPINE FINDINGS  Cervical spine imaging is severely degraded by motion. Within that limitation, there is no acute fracture. Alignment is grossly normal. IMPRESSION: 1. Chronic ischemic microangiopathy and generalized atrophy without acute intracranial abnormality. 2. Cervical spine imaging is severely degraded by motion. Within that limitation, there is no acute fracture or static subluxation of the cervical spine. Electronically Signed   By: Ulyses Jarred M.D.   On: 10/11/2020 21:10   CT Head Wo Contrast  Result Date: 10/06/2020 CLINICAL DATA:  Fall at home today. EXAM: CT HEAD WITHOUT CONTRAST CT CERVICAL SPINE WITHOUT CONTRAST TECHNIQUE: Multidetector CT imaging of the head and cervical spine was performed following the standard protocol without intravenous contrast. Multiplanar CT image reconstructions of the cervical spine were also generated. COMPARISON:  September 29, 2020. FINDINGS: CT HEAD FINDINGS Brain: Mild diffuse cortical atrophy is noted. Mild chronic ischemic white matter disease is noted. No  mass effect or midline shift is noted. Ventricular size is within normal limits. There is no evidence of mass lesion, hemorrhage or acute infarction. Vascular: No hyperdense vessel or unexpected calcification. Skull: Normal. Negative for fracture or focal lesion. Sinuses/Orbits: No acute finding. Other: None. CT CERVICAL SPINE FINDINGS Alignment: Normal. Skull base and vertebrae: No acute fracture. No primary bone lesion or focal pathologic process. Soft tissues and spinal canal: No prevertebral fluid or swelling. No visible canal hematoma. Disc levels: Moderate degenerative disc disease is noted at C3-4, C4-5, C5-6 and C6-7. Upper chest: Negative. Other: Degenerative changes are seen involving the right-sided posterior facet joints. IMPRESSION: No acute intracranial abnormality seen. Multilevel degenerative disc disease. No acute abnormality seen in the cervical spine. Electronically Signed   By: Marijo Conception M.D.   On: 10/06/2020 14:41   CT Cervical Spine Wo Contrast  Result Date: 10/11/2020 CLINICAL DATA:  Fall EXAM: CT HEAD WITHOUT CONTRAST CT CERVICAL SPINE WITHOUT CONTRAST TECHNIQUE: Multidetector CT imaging of the head and cervical spine was performed following the standard protocol without intravenous contrast. Multiplanar CT image reconstructions of the cervical spine were also generated. COMPARISON:  10/06/2020 head CT and cervical spine CT FINDINGS: CT HEAD FINDINGS Brain: There is no mass, hemorrhage or extra-axial collection. There is generalized atrophy without lobar predilection. There is hypoattenuation of the periventricular white matter, most commonly indicating chronic ischemic microangiopathy. Vascular: Atherosclerotic calcification of the internal carotid arteries at the skull base. No abnormal hyperdensity of the major intracranial arteries or dural venous sinuses. Skull: The visualized skull base, calvarium and extracranial soft tissues are normal. Sinuses/Orbits: No fluid levels or  advanced mucosal thickening of the visualized paranasal sinuses. No mastoid or middle ear effusion. The orbits are normal. CT CERVICAL SPINE FINDINGS Cervical spine imaging is severely degraded by motion. Within that limitation, there is no acute fracture. Alignment is grossly normal. IMPRESSION: 1. Chronic ischemic microangiopathy and generalized atrophy without acute intracranial abnormality. 2. Cervical spine imaging is severely degraded by motion. Within that limitation, there is no acute fracture or static subluxation of the cervical spine. Electronically Signed   By: Ulyses Jarred M.D.   On: 10/11/2020 21:10   CT Cervical Spine Wo Contrast  Result Date: 10/06/2020 CLINICAL DATA:  Fall at home today. EXAM: CT HEAD WITHOUT CONTRAST CT CERVICAL SPINE WITHOUT CONTRAST TECHNIQUE: Multidetector CT imaging of the head and cervical spine was performed following the standard protocol without intravenous contrast. Multiplanar CT image reconstructions of the cervical spine were also generated. COMPARISON:  September 29, 2020. FINDINGS: CT HEAD FINDINGS Brain: Mild diffuse cortical atrophy is noted.  Mild chronic ischemic white matter disease is noted. No mass effect or midline shift is noted. Ventricular size is within normal limits. There is no evidence of mass lesion, hemorrhage or acute infarction. Vascular: No hyperdense vessel or unexpected calcification. Skull: Normal. Negative for fracture or focal lesion. Sinuses/Orbits: No acute finding. Other: None. CT CERVICAL SPINE FINDINGS Alignment: Normal. Skull base and vertebrae: No acute fracture. No primary bone lesion or focal pathologic process. Soft tissues and spinal canal: No prevertebral fluid or swelling. No visible canal hematoma. Disc levels: Moderate degenerative disc disease is noted at C3-4, C4-5, C5-6 and C6-7. Upper chest: Negative. Other: Degenerative changes are seen involving the right-sided posterior facet joints. IMPRESSION: No acute intracranial  abnormality seen. Multilevel degenerative disc disease. No acute abnormality seen in the cervical spine. Electronically Signed   By: Marijo Conception M.D.   On: 10/06/2020 14:41   CT Hip Right Wo Contrast  Result Date: 10/06/2020 CLINICAL DATA:  Right hip pain after fall. EXAM: CT OF THE RIGHT HIP WITHOUT CONTRAST TECHNIQUE: Multidetector CT imaging of the right hip was performed according to the standard protocol. Multiplanar CT image reconstructions were also generated. COMPARISON:  Right hip x-rays from same day. CT abdomen pelvis dated September 14, 2020. FINDINGS: Bones/Joint/Cartilage No fracture or dislocation. Mild right hip osteoarthritis. No joint effusion. Ligaments Ligaments are suboptimally evaluated by CT. Muscles and Tendons Grossly intact. Soft tissue No fluid collection or hematoma. No soft tissue mass. Foley catheter in the bladder. IMPRESSION: 1. No acute osseous abnormality. 2. Mild right hip osteoarthritis. Electronically Signed   By: Titus Dubin M.D.   On: 10/06/2020 14:20   DG Chest Portable 1 View  Result Date: 10/11/2020 CLINICAL DATA:  Fall EXAM: PORTABLE CHEST 1 VIEW COMPARISON:  09/16/2020, CT 12/21/2019, radiograph 05/02/2016 FINDINGS: Diminished lung volumes with elevation of right diaphragm. Streaky atelectasis at the right base. No consolidation, pleural effusion or pneumothorax. Stable cardiomediastinal silhouette. IMPRESSION: Low lung volumes with elevated right diaphragm and subsegmental atelectasis at the right base. Electronically Signed   By: Donavan Foil M.D.   On: 10/11/2020 21:26   CT Renal Stone Study  Result Date: 10/12/2020 CLINICAL DATA:  Hematuria EXAM: CT ABDOMEN AND PELVIS WITHOUT CONTRAST TECHNIQUE: Multidetector CT imaging of the abdomen and pelvis was performed following the standard protocol without IV contrast. COMPARISON:  09/14/2020 FINDINGS: Motion degraded images. Lower chest: Mild subpleural reticulation in the lungs bilaterally, likely post  infectious/inflammatory. Hepatobiliary: Unenhanced liver is unremarkable. Gallbladder is unremarkable. No intrahepatic or extrahepatic ductal dilatation. Pancreas: Within normal limits. Spleen: Within normal limits. Adrenals/Urinary Tract: Adrenal glands are within normal limits. Two nonobstructing right upper pole renal calculi measuring up to 2 mm (series 3/image 43), unchanged. Additional bilateral renal cysts, including a 16 mm hyperdense probable cyst in the lateral interpolar left kidney (series 3/image 52), poorly evaluated on unenhanced CT. No hydronephrosis. Bladder is decompressed by an indwelling Foley catheter. Stomach/Bowel: Stomach is within normal limits. No evidence of bowel obstruction. Normal appendix (series 3/image 70). Mild sigmoid diverticulosis, without evidence of diverticulitis. Vascular/Lymphatic: No evidence of abdominal aortic aneurysm. Atherosclerotic calcifications of the abdominal aorta and branch vessels. No suspicious abdominopelvic lymphadenopathy. Reproductive: Prostate is unremarkable. Other: No abdominopelvic ascites. Musculoskeletal: Degenerative changes of the visualized thoracolumbar spine. IMPRESSION: Motion degraded images. Two nonobstructing right upper pole renal calculi measuring up to 2 mm, unchanged. No hydronephrosis. Bilateral renal cysts, poorly evaluated, including a 16 mm hyperdense probable cyst in the lateral interpolar left kidney. This previously measured  10 mm in 2015, supporting a benign etiology. Bladder decompressed by indwelling Foley catheter. Electronically Signed   By: Julian Hy M.D.   On: 10/12/2020 01:56   DG HIP UNILAT WITH PELVIS 2-3 VIEWS LEFT  Result Date: 10/11/2020 CLINICAL DATA:  Fall on blood thinners. EXAM: DG HIP (WITH OR WITHOUT PELVIS) 2-3V LEFT COMPARISON:  Abdomen 01/11/2015 FINDINGS: Catheter projected over the midline likely representing a Foley catheter. Pelvis and left hip appear intact. No acute displaced fractures  identified. Vascular calcifications. SI joints and symphysis pubis are not displaced. IMPRESSION: No acute displaced fractures identified. Electronically Signed   By: Lucienne Capers M.D.   On: 10/11/2020 20:48   DG HIP UNILAT WITH PELVIS 2-3 VIEWS RIGHT  Result Date: 10/11/2020 CLINICAL DATA:  [place] on blood thinners. EXAM: DG HIP (WITH OR WITHOUT PELVIS) 2-3V RIGHT COMPARISON:  10/06/2020 FINDINGS: Foley catheter. Right hip appears intact. No evidence of acute fracture or dislocation. No focal bone lesion or bone destruction. Mild degenerative changes in the hips. Vascular calcifications. IMPRESSION: No acute displaced fractures identified. Electronically Signed   By: Lucienne Capers M.D.   On: 10/11/2020 20:49   DG Hip Unilat W or Wo Pelvis 2-3 Views Right  Result Date: 10/06/2020 CLINICAL DATA:  [place] Ard low back pain on the right side.  Fall. EXAM: DG HIP (WITH OR WITHOUT PELVIS) 2-3V RIGHT COMPARISON:  CT pelvis 09/14/2020 FINDINGS: A catheter is noted projecting over the central pelvis. Mild spurring of the SI joints. Lower lumbar spondylosis and degenerative disc disease. Vascular calcifications. Moderate craniocaudad and axial loss of articular space in both hips with mild associated spurring. There is some ridging of the junction of the right femoral head and neck which is probably degenerative rather than posttraumatic. I not see a discrete fracture. IMPRESSION: 1. No fracture identified. If the patient is unable to bear weight then cross-sectional imaging may be warranted. 2. Lower lumbar spondylosis and degenerative disc disease. 3. Moderate degenerative arthropathy of both hips. Electronically Signed   By: Van Clines M.D.   On: 10/06/2020 13:27   [place] EKG SITE RITE  Result Date: 10/29/2020 If Site Rite image not attached, placement could not be confirmed due to current cardiac rhythm.  [place] EKG SITE RITE  Result Date: 10/15/2020 If Site Rite image not attached, placement could not be  confirmed due to current cardiac rhythm.   Microbiology: Recent Results (from the past 240 hour(s))  Urine Culture     Status: Abnormal   Collection Time: 10/25/20  3:45 PM   Specimen: Urine, Clean Catch  Result Value Ref Range Status   Specimen Description URINE, CLEAN CATCH  Final   Special Requests   Final    NONE Performed at [facility], [address], [place], [place] [address]    Culture (A)  Final    >=100,000 COLONIES/mL KLEBSIELLA PNEUMONIAE Confirmed Extended Spectrum Beta-Lactamase Producer (ESBL).  In bloodstream infections from ESBL organisms, carbapenems are preferred over piperacillin/tazobactam. They are shown to have a lower risk of mortality.    Report Status 10/28/2020 FINAL  Final   Organism ID, Bacteria KLEBSIELLA PNEUMONIAE (A)  Final      Susceptibility   Klebsiella pneumoniae - MIC*    AMPICILLIN >=32 RESISTANT Resistant     CEFAZOLIN >=64 RESISTANT Resistant     CEFEPIME 2 SENSITIVE Sensitive     CEFTRIAXONE >=64 RESISTANT Resistant     CIPROFLOXACIN 2 INTERMEDIATE Intermediate     GENTAMICIN >=16 RESISTANT Resistant  IMIPENEM <=0.25 SENSITIVE Sensitive     NITROFURANTOIN 128 RESISTANT Resistant     TRIMETH/SULFA >=320 RESISTANT Resistant     AMPICILLIN/SULBACTAM >=32 RESISTANT Resistant     PIP/TAZO 16 SENSITIVE Sensitive     * >=100,000 COLONIES/mL KLEBSIELLA PNEUMONIAE  Blood culture (routine x 2)     Status: None (Preliminary result)   Collection Time: 10/25/20  9:19 PM   Specimen: BLOOD LEFT HAND  Result Value Ref Range Status   Specimen Description BLOOD LEFT HAND  Final   Special Requests   Final    BOTTLES DRAWN AEROBIC AND ANAEROBIC Blood Culture results may not be optimal due to an inadequate volume of blood received in culture bottles   Culture   Final    NO GROWTH 4 DAYS Performed at [facility] [address] [place], [place] [address]    Report Status PENDING  Incomplete  Resp Panel by RT-PCR (Flu A&B, Covid)  Nasopharyngeal Swab     Status: None   Collection Time: 10/25/20  9:47 PM   Specimen: Nasopharyngeal Swab; Nasopharyngeal(NP) swabs in vial transport medium  Result Value Ref Range Status   SARS Coronavirus 2 by RT PCR NEGATIVE NEGATIVE Final    Comment: (NOTE) SARS-CoV-2 target nucleic acids are NOT DETECTED.  The SARS-CoV-2 RNA is generally detectable in upper respiratory specimens during the acute phase of infection. The lowest concentration of SARS-CoV-2 viral copies this assay can detect is 138 copies/mL. A negative result does not preclude SARS-Cov-2 infection and should not be used as the sole basis for treatment or other patient management decisions. A negative result may occur with  improper specimen collection/handling, submission of specimen other than nasopharyngeal swab, presence of viral mutation(s) within the areas targeted by this assay, and inadequate number of viral copies(<138 copies/mL). A negative result must be combined with clinical observations, patient history, and epidemiological information. The expected result is Negative.  Fact Sheet for Patients:  EntrepreneurPulse.com.au  Fact Sheet for Healthcare Providers:  IncredibleEmployment.be  This test is no t yet approved or cleared by the [place] FDA and  has been authorized for detection and/or diagnosis of SARS-CoV-2 by FDA under an Emergency Use Authorization (EUA). This EUA will remain  in effect (meaning this test can be used) for the duration of the COVID-19 declaration under Section 564(b)(1) of the Act, 21 U.S.C.section 360bbb-3(b)(1), unless the authorization is terminated  or revoked sooner.       Influenza A by PCR NEGATIVE NEGATIVE Final   Influenza B by PCR NEGATIVE NEGATIVE Final    Comment: (NOTE) The Xpert Xpress SARS-CoV-2/FLU/RSV plus assay is intended as an aid in the diagnosis of influenza from Nasopharyngeal swab specimens and should not be  used as a sole basis for treatment. Nasal washings and aspirates are unacceptable for Xpert Xpress SARS-CoV-2/FLU/RSV testing.  Fact Sheet for Patients: EntrepreneurPulse.com.au  Fact Sheet for Healthcare Providers: IncredibleEmployment.be  This test is not yet approved or cleared by the [place] FDA and has been authorized for detection and/or diagnosis of SARS-CoV-2 by FDA under an Emergency Use Authorization (EUA). This EUA will remain in effect (meaning this test can be used) for the duration of the COVID-19 declaration under Section 564(b)(1) of the Act, 21 U.S.C. section 360bbb-3(b)(1), unless the authorization is terminated or revoked.  Performed at [facility], [place] [address], [place], [place] [address]   Blood culture (routine x 2)     Status: None (Preliminary result)   Collection Time:  10/25/20 10:01 PM   Specimen: BLOOD  Result Value Ref Range Status   Specimen Description BLOOD LEFT ANTECUBITAL  Final   Special Requests   Final    BOTTLES DRAWN AEROBIC AND ANAEROBIC Blood Culture results may not be optimal due to an inadequate volume of blood received in culture bottles   Culture   Final    NO GROWTH 4 DAYS Performed at [facility], [place] [address], [place], [place] [address]    Report Status PENDING  Incomplete     Labs: Basic Metabolic Panel: Recent Labs  Lab 10/25/20 1315 10/26/20 0404 10/28/20 0121 10/29/20 0647  NA 136 136 136 136  K 4.3 3.8 3.8 3.5  CL 100 105 103 103  CO2 _0 GLUCOSE 142* 102* 107* 170*  BUN 25* 29* 17 14  CREATININE 1.21 1.25* 0.94 0.90  CALCIUM 9.5 8.7* 8.8* 8.7*  MG  --  1.7 1.6* 1.8  PHOS  --  3.7 3.3  --    Liver Function Tests: Recent Labs  Lab 10/25/20 1315 10/26/20 0404 10/28/20 0121 10/29/20 0647  AST 17 12*  --  14*  ALT 26 19  --  20  ALKPHOS 68 57  --  54  BILITOT 0.8 1.2  --  0.7  PROT 6.7 5.4*  --  5.6*  ALBUMIN 3.5 2.8* 2.8* 2.7*   No  results for input(s): LIPASE, AMYLASE in the last 168 hours. No results for input(s): AMMONIA in the last 168 hours. CBC: Recent Labs  Lab 10/25/20 1315 10/26/20 0404 10/28/20 0121  WBC 14.5* 10.3 8.3  NEUTROABS 11.7* 7.9* 5.6  HGB 16.7 14.0 13.8  HCT 51.1 42.9 42.4  MCV 88.4 88.5 86.5  PLT 243 178 176   Cardiac Enzymes: No results for input(s): CKTOTAL, CKMB, CKMBINDEX, TROPONINI in the last 168 hours. BNP: BNP (last 3 results) Recent Labs    09/10/20 1108  BNP 95.6    ProBNP (last 3 results) No results for input(s): PROBNP in the last 8760 hours.  CBG: Recent Labs  Lab 10/29/20 0002 10/29/20 0420 10/29/20 0748 10/29/20 1212 10/29/20 1659  GLUCAP 124* 221* 114* 242* 192*       Signed:  Nita Sells MD   [facility] 10/29/2020, 5:26 PM

## 2020-10-30 DIAGNOSIS — R7881 Bacteremia: Secondary | ICD-10-CM | POA: Diagnosis not present

## 2020-10-30 DIAGNOSIS — D090 Carcinoma in situ of bladder: Secondary | ICD-10-CM | POA: Diagnosis not present

## 2020-10-30 DIAGNOSIS — S72002G Fracture of unspecified part of neck of left femur, subsequent encounter for closed fracture with delayed healing: Secondary | ICD-10-CM | POA: Diagnosis not present

## 2020-10-30 DIAGNOSIS — Z8554 Personal history of malignant neoplasm of ureter: Secondary | ICD-10-CM | POA: Diagnosis not present

## 2020-10-30 DIAGNOSIS — G4733 Obstructive sleep apnea (adult) (pediatric): Secondary | ICD-10-CM | POA: Diagnosis not present

## 2020-10-30 DIAGNOSIS — C678 Malignant neoplasm of overlapping sites of bladder: Secondary | ICD-10-CM | POA: Diagnosis not present

## 2020-10-30 DIAGNOSIS — R300 Dysuria: Secondary | ICD-10-CM | POA: Diagnosis not present

## 2020-10-30 DIAGNOSIS — M6281 Muscle weakness (generalized): Secondary | ICD-10-CM | POA: Diagnosis not present

## 2020-10-30 DIAGNOSIS — Y9301 Activity, walking, marching and hiking: Secondary | ICD-10-CM | POA: Diagnosis present

## 2020-10-30 DIAGNOSIS — G20 Parkinson's disease: Secondary | ICD-10-CM | POA: Diagnosis not present

## 2020-10-30 DIAGNOSIS — I129 Hypertensive chronic kidney disease with stage 1 through stage 4 chronic kidney disease, or unspecified chronic kidney disease: Secondary | ICD-10-CM | POA: Diagnosis not present

## 2020-10-30 DIAGNOSIS — W010XXA Fall on same level from slipping, tripping and stumbling without subsequent striking against object, initial encounter: Secondary | ICD-10-CM | POA: Diagnosis present

## 2020-10-30 DIAGNOSIS — Z87891 Personal history of nicotine dependence: Secondary | ICD-10-CM | POA: Diagnosis not present

## 2020-10-30 DIAGNOSIS — M9702XA Periprosthetic fracture around internal prosthetic left hip joint, initial encounter: Secondary | ICD-10-CM | POA: Diagnosis not present

## 2020-10-30 DIAGNOSIS — F4323 Adjustment disorder with mixed anxiety and depressed mood: Secondary | ICD-10-CM | POA: Diagnosis not present

## 2020-10-30 DIAGNOSIS — I10 Essential (primary) hypertension: Secondary | ICD-10-CM | POA: Diagnosis not present

## 2020-10-30 DIAGNOSIS — R102 Pelvic and perineal pain: Secondary | ICD-10-CM | POA: Diagnosis not present

## 2020-10-30 DIAGNOSIS — G8929 Other chronic pain: Secondary | ICD-10-CM | POA: Diagnosis not present

## 2020-10-30 DIAGNOSIS — S50312A Abrasion of left elbow, initial encounter: Secondary | ICD-10-CM | POA: Diagnosis not present

## 2020-10-30 DIAGNOSIS — G894 Chronic pain syndrome: Secondary | ICD-10-CM | POA: Diagnosis not present

## 2020-10-30 DIAGNOSIS — I4891 Unspecified atrial fibrillation: Secondary | ICD-10-CM | POA: Diagnosis not present

## 2020-10-30 DIAGNOSIS — E1122 Type 2 diabetes mellitus with diabetic chronic kidney disease: Secondary | ICD-10-CM | POA: Diagnosis not present

## 2020-10-30 DIAGNOSIS — R8271 Bacteriuria: Secondary | ICD-10-CM | POA: Diagnosis not present

## 2020-10-30 DIAGNOSIS — R531 Weakness: Secondary | ICD-10-CM | POA: Diagnosis not present

## 2020-10-30 DIAGNOSIS — E119 Type 2 diabetes mellitus without complications: Secondary | ICD-10-CM | POA: Diagnosis not present

## 2020-10-30 DIAGNOSIS — R3915 Urgency of urination: Secondary | ICD-10-CM | POA: Diagnosis not present

## 2020-10-30 DIAGNOSIS — R799 Abnormal finding of blood chemistry, unspecified: Secondary | ICD-10-CM | POA: Diagnosis present

## 2020-10-30 DIAGNOSIS — N50819 Testicular pain, unspecified: Secondary | ICD-10-CM | POA: Diagnosis not present

## 2020-10-30 DIAGNOSIS — Y92003 Bedroom of unspecified non-institutional (private) residence as the place of occurrence of the external cause: Secondary | ICD-10-CM | POA: Diagnosis not present

## 2020-10-30 DIAGNOSIS — R319 Hematuria, unspecified: Secondary | ICD-10-CM

## 2020-10-30 DIAGNOSIS — A419 Sepsis, unspecified organism: Secondary | ICD-10-CM | POA: Diagnosis not present

## 2020-10-30 DIAGNOSIS — I2510 Atherosclerotic heart disease of native coronary artery without angina pectoris: Secondary | ICD-10-CM | POA: Diagnosis not present

## 2020-10-30 DIAGNOSIS — N200 Calculus of kidney: Secondary | ICD-10-CM | POA: Diagnosis present

## 2020-10-30 DIAGNOSIS — Z20822 Contact with and (suspected) exposure to covid-19: Secondary | ICD-10-CM | POA: Diagnosis present

## 2020-10-30 DIAGNOSIS — M189 Osteoarthritis of first carpometacarpal joint, unspecified: Secondary | ICD-10-CM | POA: Diagnosis not present

## 2020-10-30 DIAGNOSIS — Z6821 Body mass index (BMI) 21.0-21.9, adult: Secondary | ICD-10-CM | POA: Diagnosis not present

## 2020-10-30 DIAGNOSIS — F419 Anxiety disorder, unspecified: Secondary | ICD-10-CM | POA: Diagnosis not present

## 2020-10-30 DIAGNOSIS — Z8551 Personal history of malignant neoplasm of bladder: Secondary | ICD-10-CM | POA: Diagnosis not present

## 2020-10-30 DIAGNOSIS — E1121 Type 2 diabetes mellitus with diabetic nephropathy: Secondary | ICD-10-CM | POA: Diagnosis not present

## 2020-10-30 DIAGNOSIS — Z7901 Long term (current) use of anticoagulants: Secondary | ICD-10-CM | POA: Diagnosis not present

## 2020-10-30 DIAGNOSIS — E782 Mixed hyperlipidemia: Secondary | ICD-10-CM | POA: Diagnosis not present

## 2020-10-30 DIAGNOSIS — I482 Chronic atrial fibrillation, unspecified: Secondary | ICD-10-CM | POA: Diagnosis present

## 2020-10-30 DIAGNOSIS — R6520 Severe sepsis without septic shock: Secondary | ICD-10-CM | POA: Diagnosis not present

## 2020-10-30 DIAGNOSIS — M25522 Pain in left elbow: Secondary | ICD-10-CM | POA: Diagnosis not present

## 2020-10-30 DIAGNOSIS — F5101 Primary insomnia: Secondary | ICD-10-CM | POA: Diagnosis not present

## 2020-10-30 DIAGNOSIS — E871 Hypo-osmolality and hyponatremia: Secondary | ICD-10-CM | POA: Diagnosis present

## 2020-10-30 DIAGNOSIS — Z794 Long term (current) use of insulin: Secondary | ICD-10-CM | POA: Diagnosis not present

## 2020-10-30 DIAGNOSIS — R2681 Unsteadiness on feet: Secondary | ICD-10-CM | POA: Diagnosis not present

## 2020-10-30 DIAGNOSIS — S72002A Fracture of unspecified part of neck of left femur, initial encounter for closed fracture: Secondary | ICD-10-CM | POA: Diagnosis not present

## 2020-10-30 DIAGNOSIS — I119 Hypertensive heart disease without heart failure: Secondary | ICD-10-CM | POA: Diagnosis not present

## 2020-10-30 DIAGNOSIS — D508 Other iron deficiency anemias: Secondary | ICD-10-CM | POA: Diagnosis not present

## 2020-10-30 DIAGNOSIS — F05 Delirium due to known physiological condition: Secondary | ICD-10-CM | POA: Diagnosis not present

## 2020-10-30 DIAGNOSIS — R21 Rash and other nonspecific skin eruption: Secondary | ICD-10-CM | POA: Diagnosis not present

## 2020-10-30 DIAGNOSIS — A4151 Sepsis due to Escherichia coli [E. coli]: Secondary | ICD-10-CM | POA: Diagnosis not present

## 2020-10-30 DIAGNOSIS — R278 Other lack of coordination: Secondary | ICD-10-CM | POA: Diagnosis not present

## 2020-10-30 DIAGNOSIS — Z955 Presence of coronary angioplasty implant and graft: Secondary | ICD-10-CM | POA: Diagnosis not present

## 2020-10-30 DIAGNOSIS — N401 Enlarged prostate with lower urinary tract symptoms: Secondary | ICD-10-CM | POA: Diagnosis not present

## 2020-10-30 DIAGNOSIS — M25552 Pain in left hip: Secondary | ICD-10-CM | POA: Diagnosis not present

## 2020-10-30 DIAGNOSIS — N390 Urinary tract infection, site not specified: Secondary | ICD-10-CM

## 2020-10-30 DIAGNOSIS — M1990 Unspecified osteoarthritis, unspecified site: Secondary | ICD-10-CM | POA: Diagnosis not present

## 2020-10-30 DIAGNOSIS — I440 Atrioventricular block, first degree: Secondary | ICD-10-CM | POA: Diagnosis present

## 2020-10-30 DIAGNOSIS — N402 Nodular prostate without lower urinary tract symptoms: Secondary | ICD-10-CM | POA: Diagnosis not present

## 2020-10-30 DIAGNOSIS — E860 Dehydration: Secondary | ICD-10-CM | POA: Diagnosis present

## 2020-10-30 DIAGNOSIS — G9340 Encephalopathy, unspecified: Secondary | ICD-10-CM | POA: Diagnosis not present

## 2020-10-30 DIAGNOSIS — R310 Gross hematuria: Secondary | ICD-10-CM | POA: Diagnosis not present

## 2020-10-30 DIAGNOSIS — N400 Enlarged prostate without lower urinary tract symptoms: Secondary | ICD-10-CM | POA: Diagnosis not present

## 2020-10-30 DIAGNOSIS — S72012A Unspecified intracapsular fracture of left femur, initial encounter for closed fracture: Secondary | ICD-10-CM | POA: Diagnosis not present

## 2020-10-30 DIAGNOSIS — M19012 Primary osteoarthritis, left shoulder: Secondary | ICD-10-CM | POA: Diagnosis not present

## 2020-10-30 DIAGNOSIS — R296 Repeated falls: Secondary | ICD-10-CM | POA: Diagnosis not present

## 2020-10-30 DIAGNOSIS — G2581 Restless legs syndrome: Secondary | ICD-10-CM | POA: Diagnosis present

## 2020-10-30 DIAGNOSIS — E1140 Type 2 diabetes mellitus with diabetic neuropathy, unspecified: Secondary | ICD-10-CM | POA: Diagnosis not present

## 2020-10-30 DIAGNOSIS — R350 Frequency of micturition: Secondary | ICD-10-CM | POA: Diagnosis not present

## 2020-10-30 DIAGNOSIS — Z743 Need for continuous supervision: Secondary | ICD-10-CM | POA: Diagnosis not present

## 2020-10-30 DIAGNOSIS — Z79891 Long term (current) use of opiate analgesic: Secondary | ICD-10-CM | POA: Diagnosis not present

## 2020-10-30 DIAGNOSIS — D62 Acute posthemorrhagic anemia: Secondary | ICD-10-CM | POA: Diagnosis not present

## 2020-10-30 DIAGNOSIS — F411 Generalized anxiety disorder: Secondary | ICD-10-CM | POA: Diagnosis not present

## 2020-10-30 DIAGNOSIS — N183 Chronic kidney disease, stage 3 unspecified: Secondary | ICD-10-CM | POA: Diagnosis not present

## 2020-10-30 DIAGNOSIS — R457 State of emotional shock and stress, unspecified: Secondary | ICD-10-CM | POA: Diagnosis not present

## 2020-10-30 DIAGNOSIS — Z79899 Other long term (current) drug therapy: Secondary | ICD-10-CM | POA: Diagnosis not present

## 2020-10-30 DIAGNOSIS — S72009A Fracture of unspecified part of neck of unspecified femur, initial encounter for closed fracture: Secondary | ICD-10-CM | POA: Diagnosis present

## 2020-10-30 DIAGNOSIS — R410 Disorientation, unspecified: Secondary | ICD-10-CM | POA: Diagnosis not present

## 2020-10-30 DIAGNOSIS — F39 Unspecified mood [affective] disorder: Secondary | ICD-10-CM | POA: Diagnosis present

## 2020-10-30 DIAGNOSIS — R41841 Cognitive communication deficit: Secondary | ICD-10-CM | POA: Diagnosis not present

## 2020-10-30 DIAGNOSIS — I484 Atypical atrial flutter: Secondary | ICD-10-CM | POA: Diagnosis not present

## 2020-10-30 DIAGNOSIS — E1143 Type 2 diabetes mellitus with diabetic autonomic (poly)neuropathy: Secondary | ICD-10-CM | POA: Diagnosis not present

## 2020-10-30 DIAGNOSIS — E440 Moderate protein-calorie malnutrition: Secondary | ICD-10-CM | POA: Diagnosis not present

## 2020-10-30 DIAGNOSIS — M25512 Pain in left shoulder: Secondary | ICD-10-CM | POA: Diagnosis not present

## 2020-10-30 DIAGNOSIS — L89892 Pressure ulcer of other site, stage 2: Secondary | ICD-10-CM | POA: Diagnosis not present

## 2020-10-30 DIAGNOSIS — Z7401 Bed confinement status: Secondary | ICD-10-CM | POA: Diagnosis not present

## 2020-10-30 DIAGNOSIS — N1831 Chronic kidney disease, stage 3a: Secondary | ICD-10-CM | POA: Diagnosis not present

## 2020-10-30 DIAGNOSIS — C679 Malignant neoplasm of bladder, unspecified: Secondary | ICD-10-CM | POA: Diagnosis not present

## 2020-10-30 DIAGNOSIS — G9341 Metabolic encephalopathy: Secondary | ICD-10-CM | POA: Diagnosis not present

## 2020-10-30 DIAGNOSIS — F339 Major depressive disorder, recurrent, unspecified: Secondary | ICD-10-CM | POA: Diagnosis not present

## 2020-10-30 DIAGNOSIS — G4730 Sleep apnea, unspecified: Secondary | ICD-10-CM | POA: Diagnosis not present

## 2020-10-30 DIAGNOSIS — Z7984 Long term (current) use of oral hypoglycemic drugs: Secondary | ICD-10-CM | POA: Diagnosis not present

## 2020-10-30 DIAGNOSIS — R1312 Dysphagia, oropharyngeal phase: Secondary | ICD-10-CM | POA: Diagnosis not present

## 2020-10-30 DIAGNOSIS — G3184 Mild cognitive impairment, so stated: Secondary | ICD-10-CM | POA: Diagnosis present

## 2020-10-30 DIAGNOSIS — N35911 Unspecified urethral stricture, male, meatal: Secondary | ICD-10-CM | POA: Diagnosis not present

## 2020-10-30 DIAGNOSIS — E1142 Type 2 diabetes mellitus with diabetic polyneuropathy: Secondary | ICD-10-CM | POA: Diagnosis not present

## 2020-10-30 DIAGNOSIS — I480 Paroxysmal atrial fibrillation: Secondary | ICD-10-CM | POA: Diagnosis not present

## 2020-10-30 LAB — GLUCOSE, CAPILLARY
Glucose-Capillary: 173 mg/dL — ABNORMAL HIGH (ref 70–99)
Glucose-Capillary: 185 mg/dL — ABNORMAL HIGH (ref 70–99)
Glucose-Capillary: 204 mg/dL — ABNORMAL HIGH (ref 70–99)
Glucose-Capillary: 207 mg/dL — ABNORMAL HIGH (ref 70–99)
Glucose-Capillary: 221 mg/dL — ABNORMAL HIGH (ref 70–99)
Glucose-Capillary: 293 mg/dL — ABNORMAL HIGH (ref 70–99)

## 2020-10-30 LAB — CULTURE, BLOOD (ROUTINE X 2)
Culture: NO GROWTH
Culture: NO GROWTH

## 2020-10-30 LAB — SARS CORONAVIRUS 2 (TAT 6-24 HRS): SARS Coronavirus 2: NEGATIVE

## 2020-10-30 MED ORDER — INSULIN ASPART 100 UNIT/ML IJ SOLN
0.0000 [IU] | Freq: Three times a day (TID) | INTRAMUSCULAR | Status: DC
  Administered 2020-10-30: 2 [IU] via SUBCUTANEOUS
  Administered 2020-10-30: 3 [IU] via SUBCUTANEOUS

## 2020-10-30 MED ORDER — HEPARIN SOD (PORK) LOCK FLUSH 100 UNIT/ML IV SOLN
250.0000 [IU] | Freq: Every day | INTRAVENOUS | Status: DC
  Administered 2020-10-30: 250 [IU]

## 2020-10-30 MED ORDER — ERTAPENEM IV (FOR PTA / DISCHARGE USE ONLY): 1.0000 g | INTRAVENOUS | 0 refills | Status: AC

## 2020-10-30 NOTE — Progress Notes (Signed)
Patient medication given CBG 204 dressing change done to sacral area.

## 2020-10-30 NOTE — Progress Notes (Signed)
PHARMACY CONSULT NOTE FOR:  OUTPATIENT  PARENTERAL ANTIBIOTIC THERAPY (OPAT)  Indication: Sepsis on admission secondary to recurrent UTI cystitis  Regimen: Invanz 1g IV q24h End date: 11/04/2020  IV antibiotic discharge orders are pended. To discharging provider:  please sign these orders via discharge navigator,  Select New Orders & click on the button choice - Manage This Unsigned Work.     Thank you for allowing pharmacy to be a part of this patient's care.   ;Eulogio Requena S. Alford Highland, PharmD, BCPS Clinical Staff Pharmacist [email or web address] [place] 10/30/2020, 10:31 AM

## 2020-10-30 NOTE — Discharge Summary (Addendum)
[facility] Discharge Summary   Patient: Jason Hartman:[id number]  PCP: Jason Manes, MD  Date of admission: 10/25/2020   Date of discharge:  10/30/2020     Discharge Diagnoses:  Principal diagnosis Recurrent ESBL UTI with sepsis on admission  Active Problems:   Type II diabetes mellitus (Jason [facility]man)   OBSTRUCTIVE SLEEP APNEA   RESTLESS LEG SYNDROME   Essential hypertension   Parkinsonism (HCC)   Hyperlipidemia   GERD (gastroesophageal reflux disease)   Diabetic peripheral neuropathy (HCC)   PAF (paroxysmal atrial fibrillation) (HCC)   Sepsis ([place])   UTI (urinary tract infection)   Malnutrition of moderate degree  Admitted From: Jason Hartman place Disposition:  SNF   Recommendations for Outpatient Follow-up:  PCP: follow up with PCP in1 week Remove PICC Line after Antibiotics is completed. Last day of Antibiotics 11/04/2020 follow-up with Dr. Thomasene Mohair [facility] urology for consideration of cystoscopy and discussion about next steps regarding  1] cystoscopy Requires Chem-12 CBC in about 1 week Please screen for depression and augment therapy and suggest counseling for the patient--at previous admission recently he was tapered off of BZD's successfully and placed on higher doses of his SSRIs   Follow-up Information     Jason Manes, MD .   Specialty: Internal Medicine Contact information: [address] [employer] [address] [address] [phone]         Jason Fort., MD. Schedule an appointment as soon as possible for a visit in 1 week(s).   Specialty: Urology Why: cystoscopy, Contact information: [address] [place] [address] [phone]                Discharge Instructions     [facility] pharmacist to adjust dose for Vancomycin, Aminoglycosides and other anti-infective therapies as requested by physician.   Complete by: As directed    [facility] to provide Cath Flo 44m   Complete by: As directed     Administer for PICC line occlusion and as ordered by physician for other access device issues.   Anaphylaxis Kit: Provided to treat any anaphylactic reaction to the medication being provided to the patient if First Dose or when requested by physician   Complete by: As directed    Epinephrine 166mml vial / amp: Administer 0.65m37m0.65ml110mubcutaneously once for moderate to severe anaphylaxis, nurse to call physician and pharmacy when reaction occurs and call 911 if needed for immediate care   Diphenhydramine 50mg18mIV vial: Administer 25-50mg 97mM PRN for first dose reaction, rash, itching, mild reaction, nurse to call physician and pharmacy when reaction occurs   Sodium Chloride 0.9% NS 500ml I88mdminister if needed for hypovolemic blood pressure drop or as ordered by physician after call to physician with anaphylactic reaction   Change dressing on IV access line weekly and PRN   Complete by: As directed    Flush IV access with Sodium Chloride 0.9% and Heparin 10 units/ml or 100 units/ml   Complete by: As directed    Home infusion instructions - [facility]   Complete by: As directed    Instructions: Flush IV access with Sodium Chloride 0.9% and Heparin 10units/ml or 100units/ml   Change dressing on IV access line: Weekly and PRN   Instructions Cath Flo 2mg: Ad28mister for PICC Line occlusion and as ordered by physician for other access device   [facility] pharmacist to adjust dose for: Vancomycin, Aminoglycosides and other anti-infective therapies as requested by physician  Method of administration may be changed at the discretion of home infusion pharmacist based upon assessment of the patient and/or caregiver's ability to self-administer the medication ordered   Complete by: As directed        Diet recommendation: Regular diet  Activity: The patient is advised to gradually reintroduce usual activities, as tolerated  Discharge Condition: stable  Code Status:  Full code   History of present illness: As per the H and P dictated on admission, "Jason Hartman is a 85 y.o. male with medical history significant of DM2, CAD  BPH. Bladder ca, recurrent UTI, A.fib on Eliquis   Presented with was at urology office Coffee colored urine and bilateral flank pain for 2 days He has not had anything to eat or drink all day Last was klebesiella that was resitant needing IV Ertepenem Finished last week PICC Line was taken out 2 days ago No fever no chills reports back pain No CP no ob NO BLEEDING except occasional blood in urine    Original UTi though to be foley induced"  Hospital Course:  Summary of his active problems in the hospital is as following. Sepsis on admission secondary to recurrent UTI cystitis  ESBL Klebsiella in UTI confirmed again  >100,000 CFU Discussed with Dr. Linus Hartman ID and will need ertapenem again for 7 days Remove PICC Line after Antibiotics.  urologist saw the patient on 7/23- no acute urological intervention recommended post void residual 138 -Patient has a bladder neck lesion which is unclear in terms of etiology and which may also be a nidus for what is going on- -Patient to follow-up with Dr. Thomasene Mohair [facility] and I have expressed this to the patient and his wife at the bedside.   Paroxysmal atrial fibrillation  CHADS2 score >3 , rate controlled - Amiodarone 200 daily, metoprolol 12.5 twice daily -Continue Eliquis 5 twice daily cautiously given his bladder neck lesion   Hypomagnesemia Corrected    DM TY 2 neuropathy nephropathy a1c 7.2 opn longterm insulin use - Home meds Lantus 8 twice daily, metformin 500 every morning -Semaglutide to be resumed on discharge  Parkinson's disease Underlying moderate depression - Continue rotigotine 8 mg/20 4H patch and outpatient follow-up with specialist - Continue resuming Cymbalta 30 daily, Elavil 10 at bedtime  -BZD's discontinued last hospital stay - A lot of this is  situational-never has really been really "sick" and his wife relates that he is always had a good attitude-he will need counseling which is what I think will help him the most with this   Sacral decubitus secondary to immobility -Appreciate wound care instructions, turn q2 as needed -Apply Desitin to buttocks/sacrum BID and PRN when turning or cleaning.    Moderate malnutrition, BMI 30 Body mass index is 30.27 kg/m.  Nutrition Problem: Moderate Malnutrition Etiology: chronic illness (CKD) Nutrition Interventions: Interventions: Glucerna shake, Prostat, MVI Continue supplements   Patient was seen by physical therapy, who recommended SNF. On the day of the discharge the patient's vitals were stable, and no other new acute medical condition were reported. The patient was felt safe to be discharge at SNF with Therapy.  Consultants: urology  Procedures: none  DISCHARGE MEDICATION: Allergies as of 10/30/2020       Reactions   Oxycodone Anaphylaxis   Other reaction(s): Other (See Comments) Cannot recall specifics   Iodinated Diagnostic Agents Hives   alleric to renografin,isovue & omnipaque, hives, requires 13 hr prep//a.calhoun, Onset Date: [id number]   Iodine Hives, Rash   alleric  to renografin,isovue & omnipaque, hives, requires 13 hr prep//a.calhoun, Onset Date: [id number] allergic to renografin,isovue & omnipaque, hives, requires 13 hr prep//a.calhoun, Onset Date: [id number]   Linezolid Hives, Rash      Methadone Hcl Other (See Comments)   HALLUCINATIONS   Promethazine Other (See Comments)   Mental status change, DELIRIUM Other reaction(s): erratic behavior Other reaction(s): Other (See Comments) Mental status change DELIRIUM (pulled out IV)   Isovue [iopamidol]    Morphine Sulfate Other (See Comments)   Pt not sure about allergy    Omnipaque [iohexol] Hives   Code: HIVES, Desc: alleric to renografin,isovue & omnipaque, hives, requires 13 hr prep//a.calhoun, Onset Date: [id number]    Other Hives   Other reaction(s): erratic behanvior Other reaction(s): erratic behavior   Oxycodone Hcl Other (See Comments)   Pt not sure about allergy    Quetiapine Other (See Comments)   Pt not sure about allergy  Other reaction(s): Other (See Comments) Pt not sure about allergy  Pt not sure about allergy    Renografin [diatrizoate]    Statins    Other reaction(s): muscle weakness Other reaction(s): muscle weakness   Zolpidem Other (See Comments)   Other reaction(s): erratic behanvior Other reaction(s): Delusions (intolerance) Altered mental status   Atorvastatin Itching, Other (See Comments)   Tired, weakness   Carbidopa-levodopa Anxiety   Chlorhexidine Gluconate [chlorhexidine] Hives, Rash   Clindamycin Rash   Colesevelam Other (See Comments)   tired   Doxazosin Rash   Lovastatin Other (See Comments)   Tired, nervousness   Rosuvastatin Other (See Comments)   Tired, weakness        Medication List     STOP taking these medications    BD Pen Needle Nano 2nd Gen 32G X 4 MM Misc Generic drug: Insulin Pen Needle   diphenhydrAMINE 25 MG tablet Commonly known as: BENADRYL   eucerin lotion   fluticasone 50 MCG/ACT nasal spray Commonly known as: FLONASE   hydrocortisone 1 % lotion   loperamide 2 MG tablet Commonly known as: IMODIUM A-D   LORazepam 0.5 MG tablet Commonly known as: ATIVAN   tadalafil 5 MG tablet Commonly known as: CIALIS       TAKE these medications    acetaminophen 500 MG tablet Commonly known as: TYLENOL Take 500 mg by mouth 3 (three) times daily.   amiodarone 200 MG tablet Commonly known as: PACERONE Take 1 tablet (200 mg total) by mouth daily.   amitriptyline 10 MG tablet Commonly known as: ELAVIL Take 1 tablet (10 mg total) by mouth at bedtime.   apixaban 5 MG Tabs tablet Commonly known as: ELIQUIS Take 1 tablet (5 mg total) by mouth 2 (two) times daily.   DULoxetine 30 MG capsule Commonly known as: CYMBALTA Take 1  capsule (30 mg total) by mouth daily.   ertapenem  IVPB Commonly known as: INVANZ Inject 1 g into the vein daily for 4 days. Indication:  Sepsis on admission secondary to recurrent UTI cystitis  First Dose: No Last Day of Therapy:  11/04/20 Labs - Once weekly:  CBC/D and BMP, Labs - Every other week:  ESR and CRP Method of administration: Mini-Bag Plus / Gravity Method of administration may be changed at the discretion of home infusion pharmacist based upon assessment of the patient and/or caregiver's ability to self-administer the medication ordered. Start taking on: October 31, 2020 What changed: additional instructions   ferrous sulfate 325 (65 FE) MG EC tablet Take 325 mg by mouth daily.   gabapentin  300 MG capsule Commonly known as: NEURONTIN Take 1 capsule (300 mg total) by mouth at bedtime.   Lantus SoloStar 100 UNIT/ML Solostar Pen Generic drug: insulin glargine Inject 8 Units into the skin 2 (two) times daily.   melatonin 5 MG Tabs Take 5 mg by mouth at bedtime.   metFORMIN 500 MG 24 hr tablet Commonly known as: GLUCOPHAGE-XR Take 500 mg by mouth daily with breakfast.   metoprolol tartrate 25 MG tablet Commonly known as: LOPRESSOR Take 0.5 tablets (12.5 mg total) by mouth 2 (two) times daily.   Neupro 8 MG/24HR Pt24 Generic drug: Rotigotine Place 1 patch onto the skin every evening.   Nyamyc powder Generic drug: nystatin Apply 1 application topically in the morning and at bedtime. To rash on back and groin and sacrum   Ozempic (0.25 or 0.5 MG/DOSE) 2 MG/1.5ML Sopn Generic drug: Semaglutide(0.25 or 0.5MG/DOS) Inject 0.5 mg into the skin every Tuesday.   polyethylene glycol powder 17 GM/SCOOP powder Commonly known as: GLYCOLAX/MIRALAX Take 17 g by mouth daily.   senna-docusate 8.6-50 MG tablet Commonly known as: Senokot-S Take 1 tablet by mouth 2 (two) times daily as needed for moderate constipation.   thiamine 100 MG tablet Take 1 tablet (100 mg total) by  mouth daily.   traMADol 50 MG tablet Commonly known as: ULTRAM Take 1 tablet (50 mg total) by mouth daily.               Discharge Care Instructions  (From admission, onward)           Start     Ordered   10/30/20 0000  Change dressing on IV access line weekly and PRN  (Home infusion instructions - [facility] )        10/30/20 1103            Discharge Exam: Filed Weights   10/26/20 0800  Weight: 80 kg   Vitals:   10/30/20 0427 10/30/20 0756  BP: (!) 143/74 128/60  Pulse: 72 83  Resp: 15 16  Temp:  98 F (36.7 C)  SpO2: 96% 96%   General: Appear in mild distress, no Rash; Oral Mucosa Clear, moist. no Abnormal Neck Mass Or lumps, Conjunctiva normal  Cardiovascular: S1 and S2 Present, no Murmur, Respiratory: good respiratory effort, Bilateral Air entry present and CTA, no Crackles, no wheezes Abdomen: Bowel Sound present, Soft and mild tenderness Extremities: no Pedal edema Neurology: alert and oriented to time, place, and person affect anxious. no new focal deficit Gait not checked due to patient safety concerns    The results of significant diagnostics from this hospitalization (including imaging, microbiology, ancillary and laboratory) are listed below for reference.    Significant Diagnostic Studies: CT ABDOMEN PELVIS WO CONTRAST  Result Date: 10/25/2020 CLINICAL DATA:  Flank pain and hematuria. Patient reports bilateral flank pain. Constipation. EXAM: CT ABDOMEN AND PELVIS WITHOUT CONTRAST TECHNIQUE: Multidetector CT imaging of the abdomen and pelvis was performed following the standard protocol without IV contrast. COMPARISON:  Noncontrast CT 2 weeks ago 10/12/2020 FINDINGS: Lower chest: Mild compressive atelectasis in the right lower lobe related to elevated hemidiaphragm. Coronary artery calcifications/stents. Normal heart size. Hepatobiliary: No focal hepatic abnormality on this unenhanced exam. Gallbladder physiologically distended, no  calcified stone. No biliary dilatation. Pancreas: No ductal dilatation or inflammation. Spleen: Upper normal in size spanning 13.2 cm. No focal abnormality. Adrenals/Urinary Tract: No adrenal nodule. There is bilateral renal parenchymal thinning. No hydronephrosis. There are at least 3 small right  renal calculi in the upper pole, nonobstructing. No left renal stones. There are bilateral renal cysts. 17 mm likely hyperdense cyst in the lateral mid left kidney, series 3, image 43, unchanged. No ureteral stones. Bladder is minimally distended, however mild perivesicular fat stranding. Mild wall thickening about the posterior bladder base. Stomach/Bowel: The stomach is decompressed. There is no small bowel obstruction or inflammation. Normal appendix. Small to moderate volume of colonic stool. Left colonic diverticulosis. No diverticulitis. No abnormal rectal distension. No pericolonic edema. Vascular/Lymphatic: End-stage aortic atherosclerosis without aneurysm. No enlarged lymph nodes by size criteria. Reproductive: Prostate is unremarkable. Other: No free air or free fluid. Tiny fat containing umbilical hernia. Calcifications in the anterior omentum likely represent sequela of prior granulomatous disease. Musculoskeletal: L5-S1 degenerative disc disease. There are no acute or suspicious osseous abnormalities. IMPRESSION: 1. Nonobstructing right nephrolithiasis. No hydronephrosis or obstructive uropathy. 2. Mild perivesicular fat stranding, can be seen with urinary tract infection. Mild wall thickening about the posterior bladder base. 3. Colonic diverticulosis without diverticulitis. Aortic Atherosclerosis (ICD10-I70.0). Electronically Signed   By: Keith Rake M.D.   On: 10/25/2020 21:18   DG Thoracic Spine 2 View  Result Date: 10/11/2020 CLINICAL DATA:  Back pain, fall EXAM: THORACIC SPINE 2 VIEWS COMPARISON:  None. FINDINGS: Thoracic alignment within normal limits. Vertebral body heights are maintained. Mild  degenerative osteophytes. IMPRESSION: No acute osseous abnormality Electronically Signed   By: Donavan Foil M.D.   On: 10/11/2020 21:51   DG Lumbar Spine Complete  Result Date: 10/11/2020 CLINICAL DATA:  Back pain, fall EXAM: LUMBAR SPINE - COMPLETE 4+ VIEW COMPARISON:  10/06/2020 FINDINGS: Stable lumbar alignment. Trace anterolisthesis L4 on L5. Vertebral body heights are maintained. Moderate disc space narrowing and degenerative change at L5-S1. Aortic atherosclerosis. Oval calcification is seen in left upper quadrant on one view only IMPRESSION: No acute osseous abnormality Electronically Signed   By: Donavan Foil M.D.   On: 10/11/2020 21:50   DG Lumbar Spine Complete  Result Date: 10/06/2020 CLINICAL DATA:  Fall, low back pain on the right side EXAM: LUMBAR SPINE - COMPLETE 4+ VIEW COMPARISON:  09/29/2020 FINDINGS: Vascular calcifications noted. A catheter projects over the lower pelvis. Spurring of the SI joints noted. Degenerative facet arthropathy bilaterally at L4-5 and L5-S1. Prominent space in the facet joints at L4-5 may reflect facet joint effusions. Loss of intervertebral disc height at L5-S1, similar to prior, with endplate sclerosis which is likely degenerative. Stable 3 mm degenerative anterolisthesis at L4-5. No acute fracture is identified. Aortoiliac atherosclerotic vascular disease. IMPRESSION: 1. Stable lower lumbar spondylosis and degenerative disc disease. No acute findings. 2. Grade 1 degenerative anterolisthesis at L4-5, stable. 3.  Aortic Atherosclerosis (ICD10-I70.0). Electronically Signed   By: Van Clines M.D.   On: 10/06/2020 13:24   DG Knee 2 Views Left  Result Date: 10/11/2020 CLINICAL DATA:  Fall, bilateral knee pain. EXAM: LEFT KNEE - 1-2 VIEW COMPARISON:  None. FINDINGS: No evidence of fracture, dislocation, or joint effusion. Normal joint spaces. Advanced vascular calcifications. Soft tissues are unremarkable. IMPRESSION: 1. No fracture or subluxation of the left  knee. 2. Advanced vascular calcifications. Electronically Signed   By: Keith Rake M.D.   On: 10/11/2020 20:44   DG Knee 2 Views Right  Result Date: 10/11/2020 CLINICAL DATA:  Post fall with bilateral knee pain. EXAM: RIGHT KNEE - 1-2 VIEW COMPARISON:  None. FINDINGS: No evidence of fracture, dislocation, or joint effusion. Normal joint spaces. Prominent vascular calcifications. Soft tissues are unremarkable. IMPRESSION: 1.  No fracture or subluxation of the right knee. 2. Prominent vascular calcifications. Electronically Signed   By: Keith Rake M.D.   On: 10/11/2020 20:43   CT Head Wo Contrast  Result Date: 10/11/2020 CLINICAL DATA:  Fall EXAM: CT HEAD WITHOUT CONTRAST CT CERVICAL SPINE WITHOUT CONTRAST TECHNIQUE: Multidetector CT imaging of the head and cervical spine was performed following the standard protocol without intravenous contrast. Multiplanar CT image reconstructions of the cervical spine were also generated. COMPARISON:  10/06/2020 head CT and cervical spine CT FINDINGS: CT HEAD FINDINGS Brain: There is no mass, hemorrhage or extra-axial collection. There is generalized atrophy without lobar predilection. There is hypoattenuation of the periventricular white matter, most commonly indicating chronic ischemic microangiopathy. Vascular: Atherosclerotic calcification of the internal carotid arteries at the skull base. No abnormal hyperdensity of the major intracranial arteries or dural venous sinuses. Skull: The visualized skull base, calvarium and extracranial soft tissues are normal. Sinuses/Orbits: No fluid levels or advanced mucosal thickening of the visualized paranasal sinuses. No mastoid or middle ear effusion. The orbits are normal. CT CERVICAL SPINE FINDINGS Cervical spine imaging is severely degraded by motion. Within that limitation, there is no acute fracture. Alignment is grossly normal. IMPRESSION: 1. Chronic ischemic microangiopathy and generalized atrophy without acute  intracranial abnormality. 2. Cervical spine imaging is severely degraded by motion. Within that limitation, there is no acute fracture or static subluxation of the cervical spine. Electronically Signed   By: Ulyses Jarred M.D.   On: 10/11/2020 21:10   CT Head Wo Contrast  Result Date: 10/06/2020 CLINICAL DATA:  Fall at home today. EXAM: CT HEAD WITHOUT CONTRAST CT CERVICAL SPINE WITHOUT CONTRAST TECHNIQUE: Multidetector CT imaging of the head and cervical spine was performed following the standard protocol without intravenous contrast. Multiplanar CT image reconstructions of the cervical spine were also generated. COMPARISON:  September 29, 2020. FINDINGS: CT HEAD FINDINGS Brain: Mild diffuse cortical atrophy is noted. Mild chronic ischemic white matter disease is noted. No mass effect or midline shift is noted. Ventricular size is within normal limits. There is no evidence of mass lesion, hemorrhage or acute infarction. Vascular: No hyperdense vessel or unexpected calcification. Skull: Normal. Negative for fracture or focal lesion. Sinuses/Orbits: No acute finding. Other: None. CT CERVICAL SPINE FINDINGS Alignment: Normal. Skull base and vertebrae: No acute fracture. No primary bone lesion or focal pathologic process. Soft tissues and spinal canal: No prevertebral fluid or swelling. No visible canal hematoma. Disc levels: Moderate degenerative disc disease is noted at C3-4, C4-5, C5-6 and C6-7. Upper chest: Negative. Other: Degenerative changes are seen involving the right-sided posterior facet joints. IMPRESSION: No acute intracranial abnormality seen. Multilevel degenerative disc disease. No acute abnormality seen in the cervical spine. Electronically Signed   By: Marijo Conception M.D.   On: 10/06/2020 14:41   CT Cervical Spine Wo Contrast  Result Date: 10/11/2020 CLINICAL DATA:  Fall EXAM: CT HEAD WITHOUT CONTRAST CT CERVICAL SPINE WITHOUT CONTRAST TECHNIQUE: Multidetector CT imaging of the head and cervical  spine was performed following the standard protocol without intravenous contrast. Multiplanar CT image reconstructions of the cervical spine were also generated. COMPARISON:  10/06/2020 head CT and cervical spine CT FINDINGS: CT HEAD FINDINGS Brain: There is no mass, hemorrhage or extra-axial collection. There is generalized atrophy without lobar predilection. There is hypoattenuation of the periventricular white matter, most commonly indicating chronic ischemic microangiopathy. Vascular: Atherosclerotic calcification of the internal carotid arteries at the skull base. No abnormal hyperdensity of the major intracranial arteries or  dural venous sinuses. Skull: The visualized skull base, calvarium and extracranial soft tissues are normal. Sinuses/Orbits: No fluid levels or advanced mucosal thickening of the visualized paranasal sinuses. No mastoid or middle ear effusion. The orbits are normal. CT CERVICAL SPINE FINDINGS Cervical spine imaging is severely degraded by motion. Within that limitation, there is no acute fracture. Alignment is grossly normal. IMPRESSION: 1. Chronic ischemic microangiopathy and generalized atrophy without acute intracranial abnormality. 2. Cervical spine imaging is severely degraded by motion. Within that limitation, there is no acute fracture or static subluxation of the cervical spine. Electronically Signed   By: Ulyses Jarred M.D.   On: 10/11/2020 21:10   CT Cervical Spine Wo Contrast  Result Date: 10/06/2020 CLINICAL DATA:  Fall at home today. EXAM: CT HEAD WITHOUT CONTRAST CT CERVICAL SPINE WITHOUT CONTRAST TECHNIQUE: Multidetector CT imaging of the head and cervical spine was performed following the standard protocol without intravenous contrast. Multiplanar CT image reconstructions of the cervical spine were also generated. COMPARISON:  September 29, 2020. FINDINGS: CT HEAD FINDINGS Brain: Mild diffuse cortical atrophy is noted. Mild chronic ischemic white matter disease is noted. No  mass effect or midline shift is noted. Ventricular size is within normal limits. There is no evidence of mass lesion, hemorrhage or acute infarction. Vascular: No hyperdense vessel or unexpected calcification. Skull: Normal. Negative for fracture or focal lesion. Sinuses/Orbits: No acute finding. Other: None. CT CERVICAL SPINE FINDINGS Alignment: Normal. Skull base and vertebrae: No acute fracture. No primary bone lesion or focal pathologic process. Soft tissues and spinal canal: No prevertebral fluid or swelling. No visible canal hematoma. Disc levels: Moderate degenerative disc disease is noted at C3-4, C4-5, C5-6 and C6-7. Upper chest: Negative. Other: Degenerative changes are seen involving the right-sided posterior facet joints. IMPRESSION: No acute intracranial abnormality seen. Multilevel degenerative disc disease. No acute abnormality seen in the cervical spine. Electronically Signed   By: Marijo Conception M.D.   On: 10/06/2020 14:41   CT Hip Right Wo Contrast  Result Date: 10/06/2020 CLINICAL DATA:  Right hip pain after fall. EXAM: CT OF THE RIGHT HIP WITHOUT CONTRAST TECHNIQUE: Multidetector CT imaging of the right hip was performed according to the standard protocol. Multiplanar CT image reconstructions were also generated. COMPARISON:  Right hip x-rays from same day. CT abdomen pelvis dated September 14, 2020. FINDINGS: Bones/Joint/Cartilage No fracture or dislocation. Mild right hip osteoarthritis. No joint effusion. Ligaments Ligaments are suboptimally evaluated by CT. Muscles and Tendons Grossly intact. Soft tissue No fluid collection or hematoma. No soft tissue mass. Foley catheter in the bladder. IMPRESSION: 1. No acute osseous abnormality. 2. Mild right hip osteoarthritis. Electronically Signed   By: Titus Dubin M.D.   On: 10/06/2020 14:20   DG Chest Portable 1 View  Result Date: 10/11/2020 CLINICAL DATA:  Fall EXAM: PORTABLE CHEST 1 VIEW COMPARISON:  09/16/2020, CT 12/21/2019, radiograph  05/02/2016 FINDINGS: Diminished lung volumes with elevation of right diaphragm. Streaky atelectasis at the right base. No consolidation, pleural effusion or pneumothorax. Stable cardiomediastinal silhouette. IMPRESSION: Low lung volumes with elevated right diaphragm and subsegmental atelectasis at the right base. Electronically Signed   By: Donavan Foil M.D.   On: 10/11/2020 21:26   CT Renal Stone Study  Result Date: 10/12/2020 CLINICAL DATA:  Hematuria EXAM: CT ABDOMEN AND PELVIS WITHOUT CONTRAST TECHNIQUE: Multidetector CT imaging of the abdomen and pelvis was performed following the standard protocol without IV contrast. COMPARISON:  09/14/2020 FINDINGS: Motion degraded images. Lower chest: Mild subpleural reticulation in  the lungs bilaterally, likely post infectious/inflammatory. Hepatobiliary: Unenhanced liver is unremarkable. Gallbladder is unremarkable. No intrahepatic or extrahepatic ductal dilatation. Pancreas: Within normal limits. Spleen: Within normal limits. Adrenals/Urinary Tract: Adrenal glands are within normal limits. Two nonobstructing right upper pole renal calculi measuring up to 2 mm (series 3/image 43), unchanged. Additional bilateral renal cysts, including a 16 mm hyperdense probable cyst in the lateral interpolar left kidney (series 3/image 52), poorly evaluated on unenhanced CT. No hydronephrosis. Bladder is decompressed by an indwelling Foley catheter. Stomach/Bowel: Stomach is within normal limits. No evidence of bowel obstruction. Normal appendix (series 3/image 70). Mild sigmoid diverticulosis, without evidence of diverticulitis. Vascular/Lymphatic: No evidence of abdominal aortic aneurysm. Atherosclerotic calcifications of the abdominal aorta and branch vessels. No suspicious abdominopelvic lymphadenopathy. Reproductive: Prostate is unremarkable. Other: No abdominopelvic ascites. Musculoskeletal: Degenerative changes of the visualized thoracolumbar spine. IMPRESSION: Motion degraded  images. Two nonobstructing right upper pole renal calculi measuring up to 2 mm, unchanged. No hydronephrosis. Bilateral renal cysts, poorly evaluated, including a 16 mm hyperdense probable cyst in the lateral interpolar left kidney. This previously measured 10 mm in 2015, supporting a benign etiology. Bladder decompressed by indwelling Foley catheter. Electronically Signed   By: Julian Hy M.D.   On: 10/12/2020 01:56   DG HIP UNILAT WITH PELVIS 2-3 VIEWS LEFT  Result Date: 10/11/2020 CLINICAL DATA:  Fall on blood thinners. EXAM: DG HIP (WITH OR WITHOUT PELVIS) 2-3V LEFT COMPARISON:  Abdomen 01/11/2015 FINDINGS: Catheter projected over the midline likely representing a Foley catheter. Pelvis and left hip appear intact. No acute displaced fractures identified. Vascular calcifications. SI joints and symphysis pubis are not displaced. IMPRESSION: No acute displaced fractures identified. Electronically Signed   By: Lucienne Capers M.D.   On: 10/11/2020 20:48   DG HIP UNILAT WITH PELVIS 2-3 VIEWS RIGHT  Result Date: 10/11/2020 CLINICAL DATA:  [place] on blood thinners. EXAM: DG HIP (WITH OR WITHOUT PELVIS) 2-3V RIGHT COMPARISON:  10/06/2020 FINDINGS: Foley catheter. Right hip appears intact. No evidence of acute fracture or dislocation. No focal bone lesion or bone destruction. Mild degenerative changes in the hips. Vascular calcifications. IMPRESSION: No acute displaced fractures identified. Electronically Signed   By: Lucienne Capers M.D.   On: 10/11/2020 20:49   DG Hip Unilat W or Wo Pelvis 2-3 Views Right  Result Date: 10/06/2020 CLINICAL DATA:  Hervey Ard low back pain on the right side.  Fall. EXAM: DG HIP (WITH OR WITHOUT PELVIS) 2-3V RIGHT COMPARISON:  CT pelvis 09/14/2020 FINDINGS: A catheter is noted projecting over the central pelvis. Mild spurring of the SI joints. Lower lumbar spondylosis and degenerative disc disease. Vascular calcifications. Moderate craniocaudad and axial loss of articular space  in both hips with mild associated spurring. There is some ridging of the junction of the right femoral head and neck which is probably degenerative rather than posttraumatic. I not see a discrete fracture. IMPRESSION: 1. No fracture identified. If the patient is unable to bear weight then cross-sectional imaging may be warranted. 2. Lower lumbar spondylosis and degenerative disc disease. 3. Moderate degenerative arthropathy of both hips. Electronically Signed   By: Van Clines M.D.   On: 10/06/2020 13:27   [place] EKG SITE RITE  Result Date: 10/29/2020 If Site Rite image not attached, placement could not be confirmed due to current cardiac rhythm.  [place] EKG SITE RITE  Result Date: 10/15/2020 If Site Rite image not attached, placement could not be confirmed due to current cardiac rhythm.   Microbiology: Recent Results (from the  past 240 hour(s))  Urine Culture     Status: Abnormal   Collection Time: 10/25/20  3:45 PM   Specimen: Urine, Clean Catch  Result Value Ref Range Status   Specimen Description URINE, CLEAN CATCH  Final   Special Requests   Final    NONE Performed at [facility], [address], [place], [place] [address]    Culture (A)  Final    >=100,000 COLONIES/mL KLEBSIELLA PNEUMONIAE Confirmed Extended Spectrum Beta-Lactamase Producer (ESBL).  In bloodstream infections from ESBL organisms, carbapenems are preferred over piperacillin/tazobactam. They are shown to have a lower risk of mortality.    Report Status 10/28/2020 FINAL  Final   Organism ID, Bacteria KLEBSIELLA PNEUMONIAE (A)  Final      Susceptibility   Klebsiella pneumoniae - MIC*    AMPICILLIN >=32 RESISTANT Resistant     CEFAZOLIN >=64 RESISTANT Resistant     CEFEPIME 2 SENSITIVE Sensitive     CEFTRIAXONE >=64 RESISTANT Resistant     CIPROFLOXACIN 2 INTERMEDIATE Intermediate     GENTAMICIN >=16 RESISTANT Resistant     IMIPENEM <=0.25 SENSITIVE Sensitive     NITROFURANTOIN 128 RESISTANT Resistant      TRIMETH/SULFA >=320 RESISTANT Resistant     AMPICILLIN/SULBACTAM >=32 RESISTANT Resistant     PIP/TAZO 16 SENSITIVE Sensitive     * >=100,000 COLONIES/mL KLEBSIELLA PNEUMONIAE  Blood culture (routine x 2)     Status: None   Collection Time: 10/25/20  9:19 PM   Specimen: BLOOD LEFT HAND  Result Value Ref Range Status   Specimen Description BLOOD LEFT HAND  Final   Special Requests   Final    BOTTLES DRAWN AEROBIC AND ANAEROBIC Blood Culture results may not be optimal due to an inadequate volume of blood received in culture bottles   Culture   Final    NO GROWTH 5 DAYS Performed at [facility], [address] [place], [place] [address]    Report Status 10/30/2020 FINAL  Final  Resp Panel by RT-PCR (Flu A&B, Covid) Nasopharyngeal Swab     Status: None   Collection Time: 10/25/20  9:47 PM   Specimen: Nasopharyngeal Swab; Nasopharyngeal(NP) swabs in vial transport medium  Result Value Ref Range Status   SARS Coronavirus 2 by RT PCR NEGATIVE NEGATIVE Final    Comment: (NOTE) SARS-CoV-2 target nucleic acids are NOT DETECTED.  The SARS-CoV-2 RNA is generally detectable in upper respiratory specimens during the acute phase of infection. The lowest concentration of SARS-CoV-2 viral copies this assay can detect is 138 copies/mL. A negative result does not preclude SARS-Cov-2 infection and should not be used as the sole basis for treatment or other patient management decisions. A negative result may occur with  improper specimen collection/handling, submission of specimen other than nasopharyngeal swab, presence of viral mutation(s) within the areas targeted by this assay, and inadequate number of viral copies(<138 copies/mL). A negative result must be combined with clinical observations, patient history, and epidemiological information. The expected result is Negative.  Fact Sheet for Patients:  EntrepreneurPulse.com.au  Fact Sheet for Healthcare Providers:   IncredibleEmployment.be  This test is no t yet approved or cleared by the [place] FDA and  has been authorized for detection and/or diagnosis of SARS-CoV-2 by FDA under an Emergency Use Authorization (EUA). This EUA will remain  in effect (meaning this test can be used) for the duration of the COVID-19 declaration under Section 564(b)(1) of the Act, 21 U.S.C.section 360bbb-3(b)(1), unless the authorization is  terminated  or revoked sooner.       Influenza A by PCR NEGATIVE NEGATIVE Final   Influenza B by PCR NEGATIVE NEGATIVE Final    Comment: (NOTE) The Xpert Xpress SARS-CoV-2/FLU/RSV plus assay is intended as an aid in the diagnosis of influenza from Nasopharyngeal swab specimens and should not be used as a sole basis for treatment. Nasal washings and aspirates are unacceptable for Xpert Xpress SARS-CoV-2/FLU/RSV testing.  Fact Sheet for Patients: EntrepreneurPulse.com.au  Fact Sheet for Healthcare Providers: IncredibleEmployment.be  This test is not yet approved or cleared by the [place] FDA and has been authorized for detection and/or diagnosis of SARS-CoV-2 by FDA under an Emergency Use Authorization (EUA). This EUA will remain in effect (meaning this test can be used) for the duration of the COVID-19 declaration under Section 564(b)(1) of the Act, 21 U.S.C. section 360bbb-3(b)(1), unless the authorization is terminated or revoked.  Performed at [facility], [place] [address], [place], [place] [address]   Blood culture (routine x 2)     Status: None   Collection Time: 10/25/20 10:01 PM   Specimen: BLOOD  Result Value Ref Range Status   Specimen Description BLOOD LEFT ANTECUBITAL  Final   Special Requests   Final    BOTTLES DRAWN AEROBIC AND ANAEROBIC Blood Culture results may not be optimal due to an inadequate volume of blood received in culture bottles   Culture   Final    NO GROWTH 5  DAYS Performed at [facility], [place] [address], [place], [place] [address]    Report Status 10/30/2020 FINAL  Final  SARS CORONAVIRUS 2 (TAT 6-24 HRS) Nasopharyngeal Nasopharyngeal Swab     Status: None   Collection Time: 10/29/20  9:14 PM   Specimen: Nasopharyngeal Swab  Result Value Ref Range Status   SARS Coronavirus 2 NEGATIVE NEGATIVE Final    Comment: (NOTE) SARS-CoV-2 target nucleic acids are NOT DETECTED.  The SARS-CoV-2 RNA is generally detectable in upper and lower respiratory specimens during the acute phase of infection. Negative results do not preclude SARS-CoV-2 infection, do not rule out co-infections with other pathogens, and should not be used as the sole basis for treatment or other patient management decisions. Negative results must be combined with clinical observations, patient history, and epidemiological information. The expected result is Negative.  Fact Sheet for Patients: SugarRoll.be  Fact Sheet for Healthcare Providers: https://www.woods-mathews.com/  This test is not yet approved or cleared by the [place] FDA and  has been authorized for detection and/or diagnosis of SARS-CoV-2 by FDA under an Emergency Use Authorization (EUA). This EUA will remain  in effect (meaning this test can be used) for the duration of the COVID-19 declaration under Se ction 564(b)(1) of the Act, 21 U.S.C. section 360bbb-3(b)(1), unless the authorization is terminated or revoked sooner.  Performed at [facility], [place] [address], [place], [place] [address]      Labs: CBC: Recent Labs  Lab 10/25/20 1315 10/26/20 0404 10/28/20 0121  WBC 14.5* 10.3 8.3  NEUTROABS 11.7* 7.9* 5.6  HGB 16.7 14.0 13.8  HCT 51.1 42.9 42.4  MCV 88.4 88.5 86.5  PLT 243 178 818   Basic Metabolic Panel: Recent Labs  Lab 10/25/20 1315 10/26/20 0404 10/28/20 0121 10/29/20 0647  NA 136 136 136 136  K 4.3 3.8 3.8 3.5  CL 100  105 103 103  CO2 22 22 26 26   GLUCOSE 142* 102* 107* 170*  BUN 25* 29* 17 14  CREATININE 1.21 1.25* 0.94 0.90  CALCIUM 9.5 8.7* 8.8* 8.7*  MG  --  1.7 1.6* 1.8  PHOS  --  3.7 3.3  --    Liver Function Tests: Recent Labs  Lab 10/25/20 1315 10/26/20 0404 10/28/20 0121 10/29/20 0647  AST 17 12*  --  14*  ALT 26 19  --  20  ALKPHOS 68 57  --  54  BILITOT 0.8 1.2  --  0.7  PROT 6.7 5.4*  --  5.6*  ALBUMIN 3.5 2.8* 2.8* 2.7*   CBG: Recent Labs  Lab 10/29/20 1659 10/29/20 2003 10/30/20 0034 10/30/20 0425 10/30/20 0744  GLUCAP 192* 321* 173* 207* 293*    Time spent: 35 minutes  Signed:  Berle Mull  [facility]  10/30/2020

## 2020-10-30 NOTE — TOC Transition Note (Signed)
Transition of Care [name]) - CM/SW Discharge Note   Patient Details  Name: Jason Hartman MRN: [id number] Date of Birth: [birth year]  [name]) CM/SW Contact:  Emeterio Reeve, LCSW Phone Number: 10/30/2020, 1:26 PM   Clinical Narrative:     Patient will DC to: [name] Anticipated DC date: 10/30/20 Family notified: Wife, Consuello Masse Transport by: Corey Harold     Per MD patient ready for DC to [name]. RN, patient, patient's family, and facility notified of DC. Discharge Summary and FL2 sent to facility. DC packet on chart. Ambulance transport requested for patient.    RN to call report to [phone].  CSW will sign off for now as social work intervention is no longer needed. Please consult [place] again if new needs arise.   Final next level of care: Skilled Nursing Facility Barriers to Discharge: Barriers Resolved   Patient Goals and CMS Choice Patient states their goals for this hospitalization and ongoing recovery are:: to get better CMS Medicare.gov Compare Post Acute Care list provided to:: Patient Choice offered to / list presented to : Patient  Discharge Placement              Patient chooses bed at: [name] Patient to be transferred to facility by: [facility] Name of family member notified: Wife, Andry Barfuss Patient and family notified of of transfer: 10/30/20  Discharge Plan and Services                                     Social Determinants of Health (SDOH) Interventions     Readmission Risk Interventions No flowsheet data found.  Emeterio Reeve, [place] Clinical Social Worker [phone]

## 2020-10-30 NOTE — Progress Notes (Signed)
PT Cancellation Note  Patient Details Name: Jason Hartman MRN: [id number] DOB: 22-May-1935   Cancelled Treatment:    Reason Eval/Treat Not Completed: Other (comment).  Pt is expecting to leave to go to SNF, declined PT and asked for PT to send CNA to  help him dress.  Follow up with him to try to see as time and pt allow.   Ramond Dial 10/30/2020, 1:48 PM  Mee Hives, PT MS Acute Rehab Dept. Number: [place]

## 2020-10-31 ENCOUNTER — Other Ambulatory Visit: Payer: Self-pay

## 2020-10-31 DIAGNOSIS — E119 Type 2 diabetes mellitus without complications: Secondary | ICD-10-CM | POA: Diagnosis not present

## 2020-10-31 DIAGNOSIS — M6281 Muscle weakness (generalized): Secondary | ICD-10-CM | POA: Diagnosis not present

## 2020-10-31 DIAGNOSIS — A4151 Sepsis due to Escherichia coli [E. coli]: Secondary | ICD-10-CM | POA: Diagnosis not present

## 2020-10-31 DIAGNOSIS — I2510 Atherosclerotic heart disease of native coronary artery without angina pectoris: Secondary | ICD-10-CM | POA: Diagnosis not present

## 2020-10-31 DIAGNOSIS — N183 Chronic kidney disease, stage 3 unspecified: Secondary | ICD-10-CM | POA: Diagnosis not present

## 2020-10-31 DIAGNOSIS — E1122 Type 2 diabetes mellitus with diabetic chronic kidney disease: Secondary | ICD-10-CM | POA: Diagnosis not present

## 2020-10-31 DIAGNOSIS — Z794 Long term (current) use of insulin: Secondary | ICD-10-CM | POA: Diagnosis not present

## 2020-10-31 DIAGNOSIS — N390 Urinary tract infection, site not specified: Secondary | ICD-10-CM | POA: Diagnosis not present

## 2020-10-31 DIAGNOSIS — I480 Paroxysmal atrial fibrillation: Secondary | ICD-10-CM | POA: Diagnosis not present

## 2020-10-31 DIAGNOSIS — G20 Parkinson's disease: Secondary | ICD-10-CM | POA: Diagnosis not present

## 2020-10-31 DIAGNOSIS — I4891 Unspecified atrial fibrillation: Secondary | ICD-10-CM | POA: Diagnosis not present

## 2020-10-31 DIAGNOSIS — G8929 Other chronic pain: Secondary | ICD-10-CM | POA: Diagnosis not present

## 2020-10-31 DIAGNOSIS — R7881 Bacteremia: Secondary | ICD-10-CM | POA: Diagnosis not present

## 2020-10-31 DIAGNOSIS — R2681 Unsteadiness on feet: Secondary | ICD-10-CM | POA: Diagnosis not present

## 2020-10-31 DIAGNOSIS — I10 Essential (primary) hypertension: Secondary | ICD-10-CM | POA: Diagnosis not present

## 2020-10-31 DIAGNOSIS — R21 Rash and other nonspecific skin eruption: Secondary | ICD-10-CM | POA: Diagnosis not present

## 2020-10-31 DIAGNOSIS — F411 Generalized anxiety disorder: Secondary | ICD-10-CM | POA: Diagnosis not present

## 2020-10-31 DIAGNOSIS — G9341 Metabolic encephalopathy: Secondary | ICD-10-CM | POA: Diagnosis not present

## 2020-10-31 DIAGNOSIS — N402 Nodular prostate without lower urinary tract symptoms: Secondary | ICD-10-CM | POA: Diagnosis not present

## 2020-10-31 DIAGNOSIS — I484 Atypical atrial flutter: Secondary | ICD-10-CM | POA: Diagnosis not present

## 2020-10-31 DIAGNOSIS — D508 Other iron deficiency anemias: Secondary | ICD-10-CM | POA: Diagnosis not present

## 2020-10-31 NOTE — Patient Outreach (Signed)
[name]  10/31/2020  Jason Hartman 1935-12-22 [id number]  [name] Patient:  Medicare  Patient was less than 30 days readmission to hospital from SNF.  Patient will be followed by [name] with traditional Medicare for any known or needs for transitional care needs for returning to post facility care or complex disease management.  For questions or referrals, please contact:   [name], RN BSN [facility] Liaison  [phone] business mobile phone Toll free office [phone]  Fax number: [phone] [email or web address]

## 2020-11-02 ENCOUNTER — Other Ambulatory Visit: Payer: Self-pay | Admitting: *Deleted

## 2020-11-02 NOTE — Patient Outreach (Signed)
Member screened for potential [facility] Care Management needs. Mr. Hull resides [facility].   Updated received from [place] indicating member will return home upon SNF transition.   Will continue to follow and plan outreach as appropriate.   Marthenia Rolling, MSN, RN,BSN [place] Acute Care Coordinator [phone] [facility]) [phone]  (Toll free office)

## 2020-11-03 DIAGNOSIS — N390 Urinary tract infection, site not specified: Secondary | ICD-10-CM | POA: Diagnosis not present

## 2020-11-03 DIAGNOSIS — E782 Mixed hyperlipidemia: Secondary | ICD-10-CM | POA: Diagnosis not present

## 2020-11-03 DIAGNOSIS — Z794 Long term (current) use of insulin: Secondary | ICD-10-CM | POA: Diagnosis not present

## 2020-11-03 DIAGNOSIS — N401 Enlarged prostate with lower urinary tract symptoms: Secondary | ICD-10-CM | POA: Diagnosis not present

## 2020-11-03 DIAGNOSIS — I10 Essential (primary) hypertension: Secondary | ICD-10-CM | POA: Diagnosis not present

## 2020-11-03 DIAGNOSIS — I480 Paroxysmal atrial fibrillation: Secondary | ICD-10-CM | POA: Diagnosis not present

## 2020-11-03 DIAGNOSIS — Z7984 Long term (current) use of oral hypoglycemic drugs: Secondary | ICD-10-CM | POA: Diagnosis not present

## 2020-11-03 DIAGNOSIS — G20 Parkinson's disease: Secondary | ICD-10-CM | POA: Diagnosis not present

## 2020-11-03 DIAGNOSIS — E1122 Type 2 diabetes mellitus with diabetic chronic kidney disease: Secondary | ICD-10-CM | POA: Diagnosis not present

## 2020-11-03 DIAGNOSIS — I2510 Atherosclerotic heart disease of native coronary artery without angina pectoris: Secondary | ICD-10-CM | POA: Diagnosis not present

## 2020-11-03 DIAGNOSIS — M6281 Muscle weakness (generalized): Secondary | ICD-10-CM | POA: Diagnosis not present

## 2020-11-06 DIAGNOSIS — A4151 Sepsis due to Escherichia coli [E. coli]: Secondary | ICD-10-CM | POA: Diagnosis not present

## 2020-11-06 DIAGNOSIS — E1122 Type 2 diabetes mellitus with diabetic chronic kidney disease: Secondary | ICD-10-CM | POA: Diagnosis not present

## 2020-11-06 DIAGNOSIS — F4323 Adjustment disorder with mixed anxiety and depressed mood: Secondary | ICD-10-CM | POA: Diagnosis not present

## 2020-11-06 DIAGNOSIS — I2510 Atherosclerotic heart disease of native coronary artery without angina pectoris: Secondary | ICD-10-CM | POA: Diagnosis not present

## 2020-11-06 DIAGNOSIS — F5101 Primary insomnia: Secondary | ICD-10-CM | POA: Diagnosis not present

## 2020-11-06 DIAGNOSIS — N402 Nodular prostate without lower urinary tract symptoms: Secondary | ICD-10-CM | POA: Diagnosis not present

## 2020-11-06 DIAGNOSIS — G20 Parkinson's disease: Secondary | ICD-10-CM | POA: Diagnosis not present

## 2020-11-06 DIAGNOSIS — G894 Chronic pain syndrome: Secondary | ICD-10-CM | POA: Diagnosis not present

## 2020-11-06 DIAGNOSIS — D508 Other iron deficiency anemias: Secondary | ICD-10-CM | POA: Diagnosis not present

## 2020-11-06 DIAGNOSIS — M6281 Muscle weakness (generalized): Secondary | ICD-10-CM | POA: Diagnosis not present

## 2020-11-06 DIAGNOSIS — R310 Gross hematuria: Secondary | ICD-10-CM | POA: Diagnosis not present

## 2020-11-06 DIAGNOSIS — I480 Paroxysmal atrial fibrillation: Secondary | ICD-10-CM | POA: Diagnosis not present

## 2020-11-06 DIAGNOSIS — G4733 Obstructive sleep apnea (adult) (pediatric): Secondary | ICD-10-CM | POA: Diagnosis not present

## 2020-11-06 DIAGNOSIS — I10 Essential (primary) hypertension: Secondary | ICD-10-CM | POA: Diagnosis not present

## 2020-11-06 DIAGNOSIS — Z794 Long term (current) use of insulin: Secondary | ICD-10-CM | POA: Diagnosis not present

## 2020-11-13 DIAGNOSIS — Z794 Long term (current) use of insulin: Secondary | ICD-10-CM | POA: Diagnosis not present

## 2020-11-13 DIAGNOSIS — M6281 Muscle weakness (generalized): Secondary | ICD-10-CM | POA: Diagnosis not present

## 2020-11-13 DIAGNOSIS — N402 Nodular prostate without lower urinary tract symptoms: Secondary | ICD-10-CM | POA: Diagnosis not present

## 2020-11-13 DIAGNOSIS — I480 Paroxysmal atrial fibrillation: Secondary | ICD-10-CM | POA: Diagnosis not present

## 2020-11-13 DIAGNOSIS — G4733 Obstructive sleep apnea (adult) (pediatric): Secondary | ICD-10-CM | POA: Diagnosis not present

## 2020-11-13 DIAGNOSIS — D508 Other iron deficiency anemias: Secondary | ICD-10-CM | POA: Diagnosis not present

## 2020-11-13 DIAGNOSIS — E1122 Type 2 diabetes mellitus with diabetic chronic kidney disease: Secondary | ICD-10-CM | POA: Diagnosis not present

## 2020-11-13 DIAGNOSIS — I2510 Atherosclerotic heart disease of native coronary artery without angina pectoris: Secondary | ICD-10-CM | POA: Diagnosis not present

## 2020-11-13 DIAGNOSIS — A4151 Sepsis due to Escherichia coli [E. coli]: Secondary | ICD-10-CM | POA: Diagnosis not present

## 2020-11-13 DIAGNOSIS — G894 Chronic pain syndrome: Secondary | ICD-10-CM | POA: Diagnosis not present

## 2020-11-13 DIAGNOSIS — R310 Gross hematuria: Secondary | ICD-10-CM | POA: Diagnosis not present

## 2020-11-13 DIAGNOSIS — I10 Essential (primary) hypertension: Secondary | ICD-10-CM | POA: Diagnosis not present

## 2020-11-14 DIAGNOSIS — N390 Urinary tract infection, site not specified: Secondary | ICD-10-CM | POA: Diagnosis not present

## 2020-11-14 DIAGNOSIS — G20 Parkinson's disease: Secondary | ICD-10-CM | POA: Diagnosis not present

## 2020-11-14 DIAGNOSIS — R2681 Unsteadiness on feet: Secondary | ICD-10-CM | POA: Diagnosis not present

## 2020-11-14 DIAGNOSIS — E119 Type 2 diabetes mellitus without complications: Secondary | ICD-10-CM | POA: Diagnosis not present

## 2020-11-14 DIAGNOSIS — F411 Generalized anxiety disorder: Secondary | ICD-10-CM | POA: Diagnosis not present

## 2020-11-14 DIAGNOSIS — R7881 Bacteremia: Secondary | ICD-10-CM | POA: Diagnosis not present

## 2020-11-14 DIAGNOSIS — M6281 Muscle weakness (generalized): Secondary | ICD-10-CM | POA: Diagnosis not present

## 2020-11-14 DIAGNOSIS — I4891 Unspecified atrial fibrillation: Secondary | ICD-10-CM | POA: Diagnosis not present

## 2020-11-14 DIAGNOSIS — N183 Chronic kidney disease, stage 3 unspecified: Secondary | ICD-10-CM | POA: Diagnosis not present

## 2020-11-14 DIAGNOSIS — G9341 Metabolic encephalopathy: Secondary | ICD-10-CM | POA: Diagnosis not present

## 2020-11-14 DIAGNOSIS — G8929 Other chronic pain: Secondary | ICD-10-CM | POA: Diagnosis not present

## 2020-11-15 DIAGNOSIS — R350 Frequency of micturition: Secondary | ICD-10-CM | POA: Diagnosis not present

## 2020-11-15 DIAGNOSIS — R300 Dysuria: Secondary | ICD-10-CM | POA: Diagnosis not present

## 2020-11-15 DIAGNOSIS — R3915 Urgency of urination: Secondary | ICD-10-CM | POA: Diagnosis not present

## 2020-11-15 DIAGNOSIS — R310 Gross hematuria: Secondary | ICD-10-CM | POA: Diagnosis not present

## 2020-11-15 DIAGNOSIS — C679 Malignant neoplasm of bladder, unspecified: Secondary | ICD-10-CM | POA: Diagnosis not present

## 2020-11-15 DIAGNOSIS — C678 Malignant neoplasm of overlapping sites of bladder: Secondary | ICD-10-CM | POA: Diagnosis not present

## 2020-11-15 DIAGNOSIS — N400 Enlarged prostate without lower urinary tract symptoms: Secondary | ICD-10-CM | POA: Diagnosis not present

## 2020-11-16 DIAGNOSIS — C679 Malignant neoplasm of bladder, unspecified: Secondary | ICD-10-CM | POA: Diagnosis not present

## 2020-11-16 DIAGNOSIS — Z794 Long term (current) use of insulin: Secondary | ICD-10-CM | POA: Diagnosis not present

## 2020-11-16 DIAGNOSIS — I484 Atypical atrial flutter: Secondary | ICD-10-CM | POA: Diagnosis not present

## 2020-11-16 DIAGNOSIS — A4151 Sepsis due to Escherichia coli [E. coli]: Secondary | ICD-10-CM | POA: Diagnosis not present

## 2020-11-16 DIAGNOSIS — L89892 Pressure ulcer of other site, stage 2: Secondary | ICD-10-CM | POA: Diagnosis not present

## 2020-11-16 DIAGNOSIS — E1122 Type 2 diabetes mellitus with diabetic chronic kidney disease: Secondary | ICD-10-CM | POA: Diagnosis not present

## 2020-11-16 DIAGNOSIS — E1143 Type 2 diabetes mellitus with diabetic autonomic (poly)neuropathy: Secondary | ICD-10-CM | POA: Diagnosis not present

## 2020-11-16 DIAGNOSIS — G20 Parkinson's disease: Secondary | ICD-10-CM | POA: Diagnosis not present

## 2020-11-16 DIAGNOSIS — I10 Essential (primary) hypertension: Secondary | ICD-10-CM | POA: Diagnosis not present

## 2020-11-16 DIAGNOSIS — N390 Urinary tract infection, site not specified: Secondary | ICD-10-CM | POA: Diagnosis not present

## 2020-11-16 DIAGNOSIS — E440 Moderate protein-calorie malnutrition: Secondary | ICD-10-CM | POA: Diagnosis not present

## 2020-11-16 DIAGNOSIS — I480 Paroxysmal atrial fibrillation: Secondary | ICD-10-CM | POA: Diagnosis not present

## 2020-11-19 ENCOUNTER — Other Ambulatory Visit: Payer: Self-pay

## 2020-11-19 ENCOUNTER — Emergency Department
Admission: EM | Admit: 2020-11-19 | Discharge: 2020-11-19 | Disposition: A | Discharge status: Home / Self Care | Payer: Medicare Other | Attending: Emergency Medicine | Admitting: Emergency Medicine

## 2020-11-19 DIAGNOSIS — E1122 Type 2 diabetes mellitus with diabetic chronic kidney disease: Secondary | ICD-10-CM | POA: Diagnosis not present

## 2020-11-19 DIAGNOSIS — I129 Hypertensive chronic kidney disease with stage 1 through stage 4 chronic kidney disease, or unspecified chronic kidney disease: Secondary | ICD-10-CM | POA: Diagnosis not present

## 2020-11-19 DIAGNOSIS — Z7901 Long term (current) use of anticoagulants: Secondary | ICD-10-CM | POA: Diagnosis not present

## 2020-11-19 DIAGNOSIS — R310 Gross hematuria: Secondary | ICD-10-CM

## 2020-11-19 DIAGNOSIS — I119 Hypertensive heart disease without heart failure: Secondary | ICD-10-CM | POA: Diagnosis not present

## 2020-11-19 DIAGNOSIS — Z79899 Other long term (current) drug therapy: Secondary | ICD-10-CM | POA: Diagnosis not present

## 2020-11-19 DIAGNOSIS — Z8554 Personal history of malignant neoplasm of ureter: Secondary | ICD-10-CM | POA: Insufficient documentation

## 2020-11-19 DIAGNOSIS — Z87891 Personal history of nicotine dependence: Secondary | ICD-10-CM | POA: Diagnosis not present

## 2020-11-19 DIAGNOSIS — N1831 Chronic kidney disease, stage 3a: Secondary | ICD-10-CM | POA: Diagnosis not present

## 2020-11-19 DIAGNOSIS — Z7984 Long term (current) use of oral hypoglycemic drugs: Secondary | ICD-10-CM | POA: Insufficient documentation

## 2020-11-19 DIAGNOSIS — I2510 Atherosclerotic heart disease of native coronary artery without angina pectoris: Secondary | ICD-10-CM | POA: Diagnosis not present

## 2020-11-19 DIAGNOSIS — Z794 Long term (current) use of insulin: Secondary | ICD-10-CM | POA: Diagnosis not present

## 2020-11-19 DIAGNOSIS — E1140 Type 2 diabetes mellitus with diabetic neuropathy, unspecified: Secondary | ICD-10-CM | POA: Diagnosis not present

## 2020-11-19 DIAGNOSIS — R319 Hematuria, unspecified: Secondary | ICD-10-CM | POA: Diagnosis not present

## 2020-11-19 LAB — COMPREHENSIVE METABOLIC PANEL
ALT: 21 U/L (ref 0–44)
AST: 21 U/L (ref 15–41)
Albumin: 3.3 g/dL — ABNORMAL LOW (ref 3.5–5.0)
Alkaline Phosphatase: 58 U/L (ref 38–126)
Anion gap: 9 (ref 5–15)
BUN: 17 mg/dL (ref 8–23)
CO2: 26 mmol/L (ref 22–32)
Calcium: 8.8 mg/dL — ABNORMAL LOW (ref 8.9–10.3)
Chloride: 102 mmol/L (ref 98–111)
Creatinine, Ser: 0.93 mg/dL (ref 0.61–1.24)
GFR, Estimated: >60 mL/min (ref >60)
Glucose, Bld: 245 mg/dL — ABNORMAL HIGH (ref 70–99)
Potassium: 3.7 mmol/L (ref 3.5–5.1)
Sodium: 137 mmol/L (ref 135–145)
Total Bilirubin: 0.9 mg/dL (ref 0.3–1.2)
Total Protein: 6.2 g/dL — ABNORMAL LOW (ref 6.5–8.1)

## 2020-11-19 LAB — CBC WITH DIFFERENTIAL/PLATELET
Abs Immature Granulocytes: 0.04 10*3/uL (ref 0.00–0.07)
Basophils Absolute: 0 10*3/uL (ref 0.0–0.1)
Basophils Relative: 0 %
Eosinophils Absolute: 0.1 10*3/uL (ref 0.0–0.5)
Eosinophils Relative: 1 %
HCT: 40.2 % (ref 39.0–52.0)
Hemoglobin: 13.3 g/dL (ref 13.0–17.0)
Immature Granulocytes: 1 %
Lymphocytes Relative: 14 %
Lymphs Abs: 1.2 10*3/uL (ref 0.7–4.0)
MCH: 29.2 pg (ref 26.0–34.0)
MCHC: 33.1 g/dL (ref 30.0–36.0)
MCV: 88.4 fL (ref 80.0–100.0)
Monocytes Absolute: 0.4 10*3/uL (ref 0.1–1.0)
Monocytes Relative: 5 %
Neutro Abs: 6.7 10*3/uL (ref 1.7–7.7)
Neutrophils Relative %: 79 %
Platelets: 186 10*3/uL (ref 150–400)
RBC: 4.55 MIL/uL (ref 4.22–5.81)
RDW: 15.2 % (ref 11.5–15.5)
WBC: 8.5 10*3/uL (ref 4.0–10.5)
nRBC: 0 % (ref 0.0–0.2)

## 2020-11-19 LAB — URINALYSIS, COMPLETE (UACMP) WITH MICROSCOPIC
Bacteria, UA: NONE SEEN
RBC / HPF: >50 RBC/hpf — ABNORMAL HIGH (ref 0–5)
Specific Gravity, Urine: 1.02 (ref 1.005–1.030)
Squamous Epithelial / HPF: NONE SEEN (ref 0–5)

## 2020-11-19 MED ORDER — ACETAMINOPHEN 500 MG PO TABS
1000.0000 mg | ORAL_TABLET | Freq: Once | ORAL | Status: AC
  Administered 2020-11-19: 1000 mg via ORAL
  Filled 2020-11-19: qty 2

## 2020-11-19 NOTE — ED Notes (Signed)
Able to access PICC to draw blood from.

## 2020-11-19 NOTE — ED Notes (Signed)
Pt very agitated and yelling at RN when IV attempted.

## 2020-11-19 NOTE — ED Provider Notes (Signed)
[facility] Emergency Department Provider Note   ____________________________________________   Event Date/Time   First MD Initiated Contact with Patient 11/19/20 [phone]     (approximate)  I have reviewed the triage vital signs and the nursing notes.   HISTORY  Chief Complaint Hematuria   HPI Jason Hartman is a 85 y.o. male who comes in for hematuria.  EMS said his urine was wine colored for about 24 hours.  Review of his old record shows that he has had cystitis and malignancy of his bladder.  Patient has had hematuria with bloody urine.  He sees urology at [facility] and his last visit was 4 days ago.         Past Medical History:  Diagnosis Date   Cellulitis and abscess of leg, except foot    Hypertension    Obstructive sleep apnea (adult) (pediatric)    Pain in joint, pelvic region and thigh    Pneumonia, organism unspecified(486)    Restless legs syndrome (RLS)    RLS (restless legs syndrome) 09/01/2012   Thoracic or lumbosacral neuritis or radiculitis, unspecified    Type II or unspecified type diabetes mellitus without mention of complication, not stated as uncontrolled    Unspecified disease of pericardium    Unspecified hereditary and idiopathic peripheral neuropathy     Patient Active Problem List   Diagnosis Date Noted   Malnutrition of moderate degree 10/27/2020   UTI (urinary tract infection) 10/25/2020   Sepsis ([place]) 10/12/2020   AMS (altered mental status)    PAF (paroxysmal atrial fibrillation) (HCC)    Acute metabolic encephalopathy [id number]   Anxiety 09/09/2020   SIRS (systemic inflammatory response syndrome) ([place]) 09/09/2020   Diaphoresis    Slow transit constipation 08/07/2020   Abdominal aortic aneurysm without rupture ([place]) 03/17/2020   Abnormal gait 03/17/2020   Ascending aorta dilatation ([place]) 03/17/2020   Diabetic renal disease ([place]) 03/17/2020   Recurrent UTI 07/16/2019   Hypertensive urgency 04/26/2019    Malignant HTN with heart disease, w/o CHF, w/o chronic kidney disease 04/26/2019   Elevated troponin    LFTs abnormal    Statin intolerance 01/15/2019   Penis pain 04/10/2018   Urethra cancer ([place]) 02/25/2018   Lower extremity edema 06/09/2017   Peptic ulcer disease 05/16/2017   GERD (gastroesophageal reflux disease) 05/16/2017   Chronic kidney disease, stage 3a ([place]) 04/11/2017   Lower urinary tract symptoms (LUTS) 04/15/2016   Narcotic dependence ([place]) 08/31/2015   Imbalance 08/31/2015   Diabetic peripheral neuropathy ([place]) 08/31/2015   Other malaise and fatigue 05/02/2015   Chronic fatigue 05/02/2015   Panic disorder without agoraphobia 03/22/2015   Hyperlipidemia 02/03/2014   Thrombocytopenia ([place]) 08/18/2013   Parkinsonism ([place]) 08/18/2013   Other disorders of lung 08/18/2013   Organic impotence 08/18/2013   Memory loss 08/18/2013   Low back pain 08/18/2013   Iron deficiency anemia 08/18/2013   Hypercalcemia 08/18/2013   Cellulitis of right leg 08/18/2013   Atherosclerotic heart disease of native coronary artery without angina pectoris 08/18/2013   RLS (restless legs syndrome) 09/01/2012   HERPES SIMPLEX INFECTION 11/06/2006   Type II diabetes mellitus ([place]) 11/06/2006   HYPOGONADISM 11/06/2006   VITAMIN D DEFICIENCY 11/06/2006   ANXIETY 11/06/2006   OBSTRUCTIVE SLEEP APNEA 11/06/2006   RESTLESS LEG SYNDROME 11/06/2006   Essential hypertension 11/06/2006   BENIGN PROSTATIC HYPERTROPHY 11/06/2006   ROSACEA 11/06/2006   OSTEOARTHRITIS 11/06/2006   INSOMNIA 11/06/2006   HYPERGLYCEMIA 11/06/2006   COLONIC POLYPS, HX  OF 11/06/2006    Past Surgical History:  Procedure Laterality Date   ANAL FISSURE REPAIR     pericarditis  2009    Prior to Admission medications   Medication Sig Start Date End Date Taking? Authorizing Provider  acetaminophen (TYLENOL) 500 MG tablet Take 500 mg by mouth 3 (three) times daily.    [provider]  amiodarone (PACERONE) 200 MG  tablet Take 1 tablet (200 mg total) by mouth daily. 09/26/20   Mercy Riding, MD  amitriptyline (ELAVIL) 10 MG tablet Take 1 tablet (10 mg total) by mouth at bedtime. 10/29/20   Nita Sells, MD  apixaban (ELIQUIS) 5 MG TABS tablet Take 1 tablet (5 mg total) by mouth 2 (two) times daily. 09/29/20 10/29/20  Mercy Riding, MD  DULoxetine (CYMBALTA) 30 MG capsule Take 1 capsule (30 mg total) by mouth daily. 10/29/20   Nita Sells, MD  ferrous sulfate 325 (65 FE) MG EC tablet Take 325 mg by mouth daily.  05/28/18   [provider]  gabapentin (NEURONTIN) 300 MG capsule Take 1 capsule (300 mg total) by mouth at bedtime. 09/25/20   Mercy Riding, MD  LANTUS SOLOSTAR 100 UNIT/ML Solostar Pen Inject 8 Units into the skin 2 (two) times daily. 09/25/20   Mercy Riding, MD  melatonin 5 MG TABS Take 5 mg by mouth at bedtime.    [provider]  metFORMIN (GLUCOPHAGE-XR) 500 MG 24 hr tablet Take 500 mg by mouth daily with breakfast. 01/17/20   [provider]  metoprolol tartrate (LOPRESSOR) 25 MG tablet Take 0.5 tablets (12.5 mg total) by mouth 2 (two) times daily. 09/25/20   Mercy Riding, MD  [facility] powder Apply 1 application topically in the morning and at bedtime. To rash on back and groin and sacrum 10/18/20   [provider]  polyethylene glycol powder (GLYCOLAX/MIRALAX) 17 GM/SCOOP powder Take 17 g by mouth daily.    [provider]  Rotigotine (NEUPRO) 8 MG/24HR PT24 Place 1 patch onto the skin every evening.    [provider]  Semaglutide,0.25 or 0.'5MG'$ /DOS, (OZEMPIC, 0.25 OR 0.5 MG/DOSE,) 2 MG/1.5ML SOPN Inject 0.5 mg into the skin every Tuesday.    [provider]  senna-docusate (SENOKOT-S) 8.6-50 MG tablet Take 1 tablet by mouth 2 (two) times daily as needed for moderate constipation. 09/25/20   Mercy Riding, MD  thiamine 100 MG tablet Take 1 tablet (100 mg total) by mouth daily. 09/26/20   Mercy Riding, MD  traMADol (ULTRAM) 50  MG tablet Take 1 tablet (50 mg total) by mouth daily. 10/29/20   Nita Sells, MD    Allergies Oxycodone, Iodinated diagnostic agents, Iodine, Linezolid, Methadone hcl, Promethazine, Isovue [iopamidol], Morphine sulfate, Omnipaque [iohexol], Other, Oxycodone hcl, Quetiapine, Renografin [diatrizoate], Statins, Zolpidem, Atorvastatin, Carbidopa-levodopa, Chlorhexidine gluconate [chlorhexidine], Clindamycin, Colesevelam, Doxazosin, Lovastatin, and Rosuvastatin  Family History  Problem Relation Age of Onset   Diabetes Father     Social History Social History   Tobacco Use   Smoking status: Former    Types: Cigarettes    Quit date: 09/01/1980    Years since quitting: 40.2   Smokeless tobacco: Never  Vaping Use   Vaping Use: Never used  Substance Use Topics   Alcohol use: No   Drug use: No    Review of Systems  Constitutional: No fever/chills Eyes: No visual changes. ENT: No sore throat. Cardiovascular: Denies chest pain. Respiratory: Denies shortness of breath. Gastrointestinal: No abdominal pain.  No  nausea, no vomiting.  No diarrhea.  No constipation. Genitourinary: Negative for dysuria. Musculoskeletal: Negative for back pain. Skin: Negative for rash. Neurological: Negative for headaches, focal weakness   ____________________________________________   PHYSICAL EXAM:  VITAL SIGNS: ED Triage Vitals  Enc Vitals Group     BP      Pulse      Resp      Temp      Temp src      SpO2      Weight      Height      Head Circumference      Peak Flow      Pain Score      Pain Loc      Pain Edu?      Excl. in [place]?    Constitutional: Alert and oriented. Well appearing and in no acute distress. Eyes: Conjunctivae are normal. PERRL. EOMI. Head: Atraumatic. Nose: No congestion/rhinnorhea. Mouth/Throat: Mucous membranes are moist.  Oropharynx non-erythematous. Neck: No stridor.  Cardiovascular: Normal rate, regular rhythm. Grossly normal heart sounds.  Good  peripheral circulation. Respiratory: Normal respiratory effort.  No retractions. Lungs CTAB. Gastrointestinal: Soft and nontender. No distention. No abdominal bruits. No CVA tenderness. Genitourinary: Normal uncircumcised male.  His urethra is open ventrally up to the foreskin.  There is no sign of any injury there. Musculoskeletal: No lower extremity tenderness nor edema.  No joint effusions. Neurologic:  Normal speech and language. No gross focal neurologic deficits are appreciated. No gait instability. Skin:  Skin is warm, dry and intact. No rash noted. Psychiatric: Mood and affect are normal. Speech and behavior are normal.  ____________________________________________   LABS (all labs ordered are listed, but only abnormal results are displayed)  Labs Reviewed  COMPREHENSIVE METABOLIC PANEL - Abnormal; Notable for the following components:      Result Value   Glucose, Bld 245 (*)    Calcium 8.8 (*)    Total Protein 6.2 (*)    Albumin 3.3 (*)    All other components within normal limits  URINALYSIS, COMPLETE (UACMP) WITH MICROSCOPIC - Abnormal; Notable for the following components:   Color, Urine RED (*)    APPearance TURBID (*)    Glucose, UA   (*)    Value: TEST NOT REPORTED DUE TO COLOR INTERFERENCE OF URINE PIGMENT   Hgb urine dipstick   (*)    Value: TEST NOT REPORTED DUE TO COLOR INTERFERENCE OF URINE PIGMENT   Bilirubin Urine   (*)    Value: TEST NOT REPORTED DUE TO COLOR INTERFERENCE OF URINE PIGMENT   Ketones, ur   (*)    Value: TEST NOT REPORTED DUE TO COLOR INTERFERENCE OF URINE PIGMENT   Protein, ur   (*)    Value: TEST NOT REPORTED DUE TO COLOR INTERFERENCE OF URINE PIGMENT   Nitrite   (*)    Value: TEST NOT REPORTED DUE TO COLOR INTERFERENCE OF URINE PIGMENT   Leukocytes,Ua   (*)    Value: TEST NOT REPORTED DUE TO COLOR INTERFERENCE OF URINE PIGMENT   RBC / HPF >50 (*)    All other components within normal limits  URINE CULTURE  CBC WITH  DIFFERENTIAL/PLATELET   ____________________________________________  EKG   ____________________________________________  RADIOLOGY Gertha Calkin, personally viewed and evaluated these images (plain radiographs) as part of my medical decision making, as well as reviewing the written report by the radiologist.  ED MD interpretation:    Official radiology report(s): No results found.  ____________________________________________   PROCEDURES  Procedure(s) performed (including Critical Care):  Procedures   ____________________________________________   INITIAL IMPRESSION / ASSESSMENT AND PLAN / ED COURSE  Patient produces a urine specimen.  It looks rather thick almost like it is blood with urine extended rather than urine with blood mixed in it.  Lab unfortunately says they cannot do hematocrits on urine.   ----------------------------------------- 1:42 PM on 11/19/2020 ----------------------------------------- Discussed patient with Dr. Venia Minks at [facility] she is on-call for the patient's regular urologist Dr. Thomasene Mohair.  He feels the patient is safe to go back home.  He suggest we stop the patient's Eliquis which is what our urologist also suggested.  We will do this.  I have discussed that with the patient and his wife.  They are both aware.  There is a slight risk of stroke but hopefully nothing much will happen in the next week or 2 before the patient has his procedure.  Patient has been warned to return if he feels weak or dizzy or develops a fever or has any other problems including the inability to urinate.  I discussed with him the fact that the blood could clot in his bladder and block the ability of the urine to leave the bladder and he would have to have a catheter for that.  Both he and his wife understand.          ____________________________________________   FINAL CLINICAL IMPRESSION(S) / ED DIAGNOSES  Final diagnoses:  Gross hematuria      ED Discharge Orders     None        Note:  This document was prepared using Dragon voice recognition software and may include unintentional dictation errors.    Nena Polio, MD 11/19/20 1343

## 2020-11-19 NOTE — ED Notes (Signed)
Lunch tray provided. 

## 2020-11-19 NOTE — TOC Initial Note (Signed)
Transition of Care [facility]) - Initial/Assessment Note    Patient Details  Name: Jason Hartman MRN: [id number] Date of Birth: 1936/02/01  Transition of Care [facility]) CM/SW Contact:    Alberteen Sam, LCSW Phone Number: 11/19/2020, 10:34 AM  Clinical Narrative:                  CSW spoke with patient's spouse Curt Bears who confirms patient is from [facility] and rehab. Reports patient had been there for the past 3 weeks and was doing well with therapies, reports she feels like he needs to return to [facility] at discharge to continue with therapies.   Curt Bears reports their supplemental insurance with [employer] pays for day 21-100 of SNF so they have no co pays.   Discharge plan for patient to return to [facility] once medically stable.     Expected Discharge Plan: Skilled Nursing Facility Barriers to Discharge: Continued Medical Work up   Patient Goals and CMS Choice Patient states their goals for this hospitalization and ongoing recovery are:: to go home CMS Medicare.gov Compare Post Acute Care list provided to:: Patient Represenative (must comment) (spouse Museum/gallery exhibitions officer) Choice offered to / list presented to : Spouse  Expected Discharge Plan and Services Expected Discharge Plan: [place]       Living arrangements for the past 2 months: [place]                                      Prior Living Arrangements/Services Living arrangements for the past 2 months: [place] Lives with:: Facility Resident Patient language and need for interpreter reviewed:: Yes Do you feel safe going back to the place where you live?: Yes      Need for Family Participation in Patient Care: Yes (Comment) Care giver support system in place?: Yes (comment)   Criminal Activity/Legal Involvement Pertinent to Current Situation/Hospitalization: No - Comment as needed  Activities of Daily Living      Permission Sought/Granted Permission sought to share information  with : Case Manager, Customer service manager, Family Supports Permission granted to share information with : Yes, Verbal Permission Granted  Share Information with NAME: Lyndel Pleasure  Permission granted to share info w AGENCY: SNFs  Permission granted to share info w Relationship: spouse  Permission granted to share info w Contact Information: [phone]  Emotional Assessment         Alcohol / Substance Use: Not Applicable Psych Involvement: No (comment)  Admission diagnosis:  blood in urine Patient Active Problem List   Diagnosis Date Noted   Malnutrition of moderate degree 10/27/2020   UTI (urinary tract infection) 10/25/2020   Sepsis ([place]) 10/12/2020   AMS (altered mental status)    PAF (paroxysmal atrial fibrillation) ([place])    Acute metabolic encephalopathy [id number]   Anxiety 09/09/2020   SIRS (systemic inflammatory response syndrome) ([place]) 09/09/2020   Diaphoresis    Slow transit constipation 08/07/2020   Abdominal aortic aneurysm without rupture ([place]) 03/17/2020   Abnormal gait 03/17/2020   Ascending aorta dilatation ([place]) 03/17/2020   Diabetic renal disease ([place]) 03/17/2020   Recurrent UTI 07/16/2019   Hypertensive urgency 04/26/2019   Malignant HTN with heart disease, w/o CHF, w/o chronic kidney disease 04/26/2019   Elevated troponin    LFTs abnormal    Statin intolerance 01/15/2019   Penis pain 04/10/2018   Urethra cancer ([place]) 02/25/2018   Lower extremity edema 06/09/2017  Peptic ulcer disease 05/16/2017   GERD (gastroesophageal reflux disease) 05/16/2017   Chronic kidney disease, stage 3a ([place]) 04/11/2017   Lower urinary tract symptoms (LUTS) 04/15/2016   Narcotic dependence ([place]) 08/31/2015   Imbalance 08/31/2015   Diabetic peripheral neuropathy ([place]) 08/31/2015   Other malaise and fatigue 05/02/2015   Chronic fatigue 05/02/2015   Panic disorder without agoraphobia 03/22/2015   Hyperlipidemia 02/03/2014   Thrombocytopenia ([place]) 08/18/2013    Parkinsonism ([place]) 08/18/2013   Other disorders of lung 08/18/2013   Organic impotence 08/18/2013   Memory loss 08/18/2013   Low back pain 08/18/2013   Iron deficiency anemia 08/18/2013   Hypercalcemia 08/18/2013   Cellulitis of right leg 08/18/2013   Atherosclerotic heart disease of native coronary artery without angina pectoris 08/18/2013   RLS (restless legs syndrome) 09/01/2012   HERPES SIMPLEX INFECTION 11/06/2006   Type II diabetes mellitus ([place]) 11/06/2006   HYPOGONADISM 11/06/2006   VITAMIN D DEFICIENCY 11/06/2006   ANXIETY 11/06/2006   OBSTRUCTIVE SLEEP APNEA 11/06/2006   RESTLESS LEG SYNDROME 11/06/2006   Essential hypertension 11/06/2006   BENIGN PROSTATIC HYPERTROPHY 11/06/2006   ROSACEA 11/06/2006   OSTEOARTHRITIS 11/06/2006   INSOMNIA 11/06/2006   HYPERGLYCEMIA 11/06/2006   COLONIC POLYPS, HX OF 11/06/2006   PCP:  [name], MD Pharmacy:   [facility] - [place], [place] - [address] [address] [address] [place] [address] Phone: [phone] Fax: [facility] [id number] - [place], [place] - [address] AT [place] [address] [place] [address] Phone: [phone] Fax: [phone]     Social Determinants of Health (SDOH) Interventions    Readmission Risk Interventions No flowsheet data found.

## 2020-11-19 NOTE — NC FL2 (Signed)
[place] LEVEL OF CARE SCREENING TOOL     IDENTIFICATION  Patient Name: Jason Hartman Birthdate: [birth year] Sex: male Admission Date (Current Location): 11/19/2020  [facility] and [place] Number:  Engineering geologist and Address:  [facility], [address] [place], [place] [address]      Provider Number: [id number]  Attending Physician Name and Address:  Nena Polio, MD  Relative Name and Phone Number:  Curt Bears (spouse) [phone]    Current Level of Care: Hospital Recommended Level of Care: [place] Prior Approval Number:    Date Approved/Denied:   PASRR Number:    Discharge Plan: SNF    Current Diagnoses: Patient Active Problem List   Diagnosis Date Noted   Malnutrition of moderate degree 10/27/2020   UTI (urinary tract infection) 10/25/2020   Sepsis ([place]) 10/12/2020   AMS (altered mental status)    PAF (paroxysmal atrial fibrillation) (HCC)    Acute metabolic encephalopathy [id number]   Anxiety 09/09/2020   SIRS (systemic inflammatory response syndrome) ([place]) 09/09/2020   Diaphoresis    Slow transit constipation 08/07/2020   Abdominal aortic aneurysm without rupture ([place]) 03/17/2020   Abnormal gait 03/17/2020   Ascending aorta dilatation ([place]) 03/17/2020   Diabetic renal disease ([place]) 03/17/2020   Recurrent UTI 07/16/2019   Hypertensive urgency 04/26/2019   Malignant HTN with heart disease, w/o CHF, w/o chronic kidney disease 04/26/2019   Elevated troponin    LFTs abnormal    Statin intolerance 01/15/2019   Penis pain 04/10/2018   Urethra cancer ([place]) 02/25/2018   Lower extremity edema 06/09/2017   Peptic ulcer disease 05/16/2017   GERD (gastroesophageal reflux disease) 05/16/2017   Chronic kidney disease, stage 3a ([place]) 04/11/2017   Lower urinary tract symptoms (LUTS) 04/15/2016   Narcotic dependence ([place]) 08/31/2015   Imbalance 08/31/2015   Diabetic peripheral neuropathy ([place]) 08/31/2015    Other malaise and fatigue 05/02/2015   Chronic fatigue 05/02/2015   Panic disorder without agoraphobia 03/22/2015   Hyperlipidemia 02/03/2014   Thrombocytopenia ([place]) 08/18/2013   Parkinsonism ([place]) 08/18/2013   Other disorders of lung 08/18/2013   Organic impotence 08/18/2013   Memory loss 08/18/2013   Low back pain 08/18/2013   Iron deficiency anemia 08/18/2013   Hypercalcemia 08/18/2013   Cellulitis of right leg 08/18/2013   Atherosclerotic heart disease of native coronary artery without angina pectoris 08/18/2013   RLS (restless legs syndrome) 09/01/2012   HERPES SIMPLEX INFECTION 11/06/2006   Type II diabetes mellitus ([place]) 11/06/2006   HYPOGONADISM 11/06/2006   VITAMIN D DEFICIENCY 11/06/2006   ANXIETY 11/06/2006   OBSTRUCTIVE SLEEP APNEA 11/06/2006   RESTLESS LEG SYNDROME 11/06/2006   Essential hypertension 11/06/2006   BENIGN PROSTATIC HYPERTROPHY 11/06/2006   ROSACEA 11/06/2006   OSTEOARTHRITIS 11/06/2006   INSOMNIA 11/06/2006   HYPERGLYCEMIA 11/06/2006   COLONIC POLYPS, HX OF 11/06/2006    Orientation RESPIRATION BLADDER Height & Weight     Self, Time, Situation, Place  Normal Continent Weight: 176 lb 5.9 oz (80 kg) Height:  '5\' 9"'$  (175.3 cm)  BEHAVIORAL SYMPTOMS/MOOD NEUROLOGICAL BOWEL NUTRITION STATUS      Continent Diet (see discharge summary)  AMBULATORY STATUS COMMUNICATION OF NEEDS Skin   Extensive Assist Verbally Other (Comment) (MASD buttocks, skin tear left arm)                       Personal Care Assistance Level of Assistance  Bathing, Feeding, Dressing Bathing Assistance: Limited assistance Feeding assistance: Independent  Dressing Assistance: Limited assistance     Functional Limitations Info  Sight, Hearing, Speech Sight Info: Impaired Hearing Info: Impaired Speech Info: Adequate    SPECIAL CARE FACTORS FREQUENCY  PT (By licensed PT), OT (By licensed OT)     PT Frequency: min 4x weekly OT Frequency: min 4x weekly             Contractures Contractures Info: Not present    Additional Factors Info  Code Status, Allergies Code Status Info: full Allergies Info: Oxycodone   Iodinated Diagnostic Agents   Iodine   Linezolid   Methadone Hcl   Promethazine   Isovue (Iopamidol)   Morphine Sulfate   Omnipaque (Iohexol)   Other   Oxycodone Hcl   Quetiapine   Renografin (Diatrizoate)   Statins   Zolpidem   Atorvastatin   Carbidopa-levodopa   Chlorhexidine Gluconate (Chlorhexidine)   Clindamycin   Colesevelam   Doxazosin   Lovastatin   Rosuvastatin           Current Medications (11/19/2020):  This is the current hospital active medication list No current facility-administered medications for this encounter.   Current Outpatient Medications  Medication Sig Dispense Refill   acetaminophen (TYLENOL) 500 MG tablet Take 500 mg by mouth 3 (three) times daily.     amiodarone (PACERONE) 200 MG tablet Take 1 tablet (200 mg total) by mouth daily.     amitriptyline (ELAVIL) 10 MG tablet Take 1 tablet (10 mg total) by mouth at bedtime. 3 tablet 0   apixaban (ELIQUIS) 5 MG TABS tablet Take 1 tablet (5 mg total) by mouth 2 (two) times daily. 60 tablet    DULoxetine (CYMBALTA) 30 MG capsule Take 1 capsule (30 mg total) by mouth daily. 3 capsule 0   ferrous sulfate 325 (65 FE) MG EC tablet Take 325 mg by mouth daily.      gabapentin (NEURONTIN) 300 MG capsule Take 1 capsule (300 mg total) by mouth at bedtime.     LANTUS SOLOSTAR 100 UNIT/ML Solostar Pen Inject 8 Units into the skin 2 (two) times daily. 15 mL 11   melatonin 5 MG TABS Take 5 mg by mouth at bedtime.     metFORMIN (GLUCOPHAGE-XR) 500 MG 24 hr tablet Take 500 mg by mouth daily with breakfast.     metoprolol tartrate (LOPRESSOR) 25 MG tablet Take 0.5 tablets (12.5 mg total) by mouth 2 (two) times daily.     NYAMYC powder Apply 1 application topically in the morning and at bedtime. To rash on back and groin and sacrum     polyethylene glycol powder (GLYCOLAX/MIRALAX) 17  GM/SCOOP powder Take 17 g by mouth daily.     Rotigotine (NEUPRO) 8 MG/24HR PT24 Place 1 patch onto the skin every evening.     Semaglutide,0.25 or 0.'5MG'$ /DOS, (OZEMPIC, 0.25 OR 0.5 MG/DOSE,) 2 MG/1.5ML SOPN Inject 0.5 mg into the skin every Tuesday.     senna-docusate (SENOKOT-S) 8.6-50 MG tablet Take 1 tablet by mouth 2 (two) times daily as needed for moderate constipation.     thiamine 100 MG tablet Take 1 tablet (100 mg total) by mouth daily.     traMADol (ULTRAM) 50 MG tablet Take 1 tablet (50 mg total) by mouth daily. 12 tablet 0     Discharge Medications: Please see discharge summary for a list of discharge medications.  Relevant Imaging Results:  Relevant Lab Results:   Additional Information [id number]  Alberteen Sam, LCSW

## 2020-11-19 NOTE — ED Notes (Signed)
Pt called wife. She is on the way.

## 2020-11-19 NOTE — ED Notes (Signed)
This RN called [facility] and placed transport call for [facility] to come and get patient.

## 2020-11-19 NOTE — ED Triage Notes (Signed)
BIB [facility]  from [facility] for blood of urine. EMS states "color of wine" appearance x 24 hours. HX of renal issues.  Vitals WNL.

## 2020-11-19 NOTE — ED Notes (Addendum)
RN to bedside for call light. Pt had BM and needed brief changed. Pt complaint of back pain and requested a tylenol. MD advised and order placed.

## 2020-11-19 NOTE — ED Notes (Signed)
Pt calling and yelling out for more water. Cup of ice water provided.

## 2020-11-19 NOTE — Discharge Instructions (Addendum)
Please stop your Eliquis, the blood thinner.  You can restart it after the urologist tells you to.  I have discussed this with both our urologist and the urologist on-call at [facility].  They both agree.  Please return here if you have any difficulty urinating or feel weak or dizzy, develop a fever or have any other problems.  We have checked your blood count and you have plenty of blood remaining in your system.  You should be good to you see your urologist but again if you have any problems do not hesitate to return.

## 2020-11-19 NOTE — ED Notes (Signed)
RN gave pt glass of water.

## 2020-11-20 DIAGNOSIS — F5101 Primary insomnia: Secondary | ICD-10-CM | POA: Diagnosis not present

## 2020-11-20 DIAGNOSIS — G20 Parkinson's disease: Secondary | ICD-10-CM | POA: Diagnosis not present

## 2020-11-20 DIAGNOSIS — F4323 Adjustment disorder with mixed anxiety and depressed mood: Secondary | ICD-10-CM | POA: Diagnosis not present

## 2020-11-20 LAB — URINE CULTURE: Culture: <10000 — AB

## 2020-11-21 DIAGNOSIS — M6281 Muscle weakness (generalized): Secondary | ICD-10-CM | POA: Diagnosis not present

## 2020-11-21 DIAGNOSIS — I4891 Unspecified atrial fibrillation: Secondary | ICD-10-CM | POA: Diagnosis not present

## 2020-11-21 DIAGNOSIS — R7881 Bacteremia: Secondary | ICD-10-CM | POA: Diagnosis not present

## 2020-11-21 DIAGNOSIS — E1122 Type 2 diabetes mellitus with diabetic chronic kidney disease: Secondary | ICD-10-CM | POA: Diagnosis not present

## 2020-11-21 DIAGNOSIS — D508 Other iron deficiency anemias: Secondary | ICD-10-CM | POA: Diagnosis not present

## 2020-11-21 DIAGNOSIS — G9341 Metabolic encephalopathy: Secondary | ICD-10-CM | POA: Diagnosis not present

## 2020-11-21 DIAGNOSIS — Z794 Long term (current) use of insulin: Secondary | ICD-10-CM | POA: Diagnosis not present

## 2020-11-21 DIAGNOSIS — F411 Generalized anxiety disorder: Secondary | ICD-10-CM | POA: Diagnosis not present

## 2020-11-21 DIAGNOSIS — G4733 Obstructive sleep apnea (adult) (pediatric): Secondary | ICD-10-CM | POA: Diagnosis not present

## 2020-11-21 DIAGNOSIS — C679 Malignant neoplasm of bladder, unspecified: Secondary | ICD-10-CM | POA: Diagnosis not present

## 2020-11-21 DIAGNOSIS — N402 Nodular prostate without lower urinary tract symptoms: Secondary | ICD-10-CM | POA: Diagnosis not present

## 2020-11-21 DIAGNOSIS — N390 Urinary tract infection, site not specified: Secondary | ICD-10-CM | POA: Diagnosis not present

## 2020-11-21 DIAGNOSIS — G8929 Other chronic pain: Secondary | ICD-10-CM | POA: Diagnosis not present

## 2020-11-21 DIAGNOSIS — R310 Gross hematuria: Secondary | ICD-10-CM | POA: Diagnosis not present

## 2020-11-21 DIAGNOSIS — R2681 Unsteadiness on feet: Secondary | ICD-10-CM | POA: Diagnosis not present

## 2020-11-21 DIAGNOSIS — I2510 Atherosclerotic heart disease of native coronary artery without angina pectoris: Secondary | ICD-10-CM | POA: Diagnosis not present

## 2020-11-21 DIAGNOSIS — I480 Paroxysmal atrial fibrillation: Secondary | ICD-10-CM | POA: Diagnosis not present

## 2020-11-21 DIAGNOSIS — A4151 Sepsis due to Escherichia coli [E. coli]: Secondary | ICD-10-CM | POA: Diagnosis not present

## 2020-11-21 DIAGNOSIS — I10 Essential (primary) hypertension: Secondary | ICD-10-CM | POA: Diagnosis not present

## 2020-11-21 DIAGNOSIS — G20 Parkinson's disease: Secondary | ICD-10-CM | POA: Diagnosis not present

## 2020-11-21 DIAGNOSIS — E119 Type 2 diabetes mellitus without complications: Secondary | ICD-10-CM | POA: Diagnosis not present

## 2020-11-22 ENCOUNTER — Ambulatory Visit: Payer: Medicare Other | Admitting: Podiatry

## 2020-11-24 DIAGNOSIS — N1831 Chronic kidney disease, stage 3a: Secondary | ICD-10-CM | POA: Diagnosis not present

## 2020-11-24 DIAGNOSIS — E1121 Type 2 diabetes mellitus with diabetic nephropathy: Secondary | ICD-10-CM | POA: Diagnosis not present

## 2020-11-24 DIAGNOSIS — N183 Chronic kidney disease, stage 3 unspecified: Secondary | ICD-10-CM | POA: Diagnosis not present

## 2020-11-24 DIAGNOSIS — N401 Enlarged prostate with lower urinary tract symptoms: Secondary | ICD-10-CM | POA: Diagnosis not present

## 2020-11-24 DIAGNOSIS — I129 Hypertensive chronic kidney disease with stage 1 through stage 4 chronic kidney disease, or unspecified chronic kidney disease: Secondary | ICD-10-CM | POA: Diagnosis not present

## 2020-11-28 DIAGNOSIS — F411 Generalized anxiety disorder: Secondary | ICD-10-CM | POA: Diagnosis not present

## 2020-11-28 DIAGNOSIS — G8929 Other chronic pain: Secondary | ICD-10-CM | POA: Diagnosis not present

## 2020-11-28 DIAGNOSIS — I4891 Unspecified atrial fibrillation: Secondary | ICD-10-CM | POA: Diagnosis not present

## 2020-11-28 DIAGNOSIS — R2681 Unsteadiness on feet: Secondary | ICD-10-CM | POA: Diagnosis not present

## 2020-11-28 DIAGNOSIS — G20 Parkinson's disease: Secondary | ICD-10-CM | POA: Diagnosis not present

## 2020-11-28 DIAGNOSIS — G9341 Metabolic encephalopathy: Secondary | ICD-10-CM | POA: Diagnosis not present

## 2020-11-28 DIAGNOSIS — M6281 Muscle weakness (generalized): Secondary | ICD-10-CM | POA: Diagnosis not present

## 2020-11-28 DIAGNOSIS — R7881 Bacteremia: Secondary | ICD-10-CM | POA: Diagnosis not present

## 2020-11-28 DIAGNOSIS — N390 Urinary tract infection, site not specified: Secondary | ICD-10-CM | POA: Diagnosis not present

## 2020-11-28 DIAGNOSIS — E119 Type 2 diabetes mellitus without complications: Secondary | ICD-10-CM | POA: Diagnosis not present

## 2020-11-30 DIAGNOSIS — G20 Parkinson's disease: Secondary | ICD-10-CM | POA: Diagnosis not present

## 2020-11-30 DIAGNOSIS — E782 Mixed hyperlipidemia: Secondary | ICD-10-CM | POA: Diagnosis not present

## 2020-11-30 DIAGNOSIS — I10 Essential (primary) hypertension: Secondary | ICD-10-CM | POA: Diagnosis not present

## 2020-11-30 DIAGNOSIS — Z7984 Long term (current) use of oral hypoglycemic drugs: Secondary | ICD-10-CM | POA: Diagnosis not present

## 2020-11-30 DIAGNOSIS — E1122 Type 2 diabetes mellitus with diabetic chronic kidney disease: Secondary | ICD-10-CM | POA: Diagnosis not present

## 2020-11-30 DIAGNOSIS — I480 Paroxysmal atrial fibrillation: Secondary | ICD-10-CM | POA: Diagnosis not present

## 2020-11-30 DIAGNOSIS — Z794 Long term (current) use of insulin: Secondary | ICD-10-CM | POA: Diagnosis not present

## 2020-11-30 DIAGNOSIS — I2510 Atherosclerotic heart disease of native coronary artery without angina pectoris: Secondary | ICD-10-CM | POA: Diagnosis not present

## 2020-11-30 DIAGNOSIS — M6281 Muscle weakness (generalized): Secondary | ICD-10-CM | POA: Diagnosis not present

## 2020-11-30 DIAGNOSIS — N401 Enlarged prostate with lower urinary tract symptoms: Secondary | ICD-10-CM | POA: Diagnosis not present

## 2020-11-30 DIAGNOSIS — L89892 Pressure ulcer of other site, stage 2: Secondary | ICD-10-CM | POA: Diagnosis not present

## 2020-12-01 DIAGNOSIS — N35911 Unspecified urethral stricture, male, meatal: Secondary | ICD-10-CM | POA: Diagnosis not present

## 2020-12-01 DIAGNOSIS — I2510 Atherosclerotic heart disease of native coronary artery without angina pectoris: Secondary | ICD-10-CM | POA: Diagnosis not present

## 2020-12-01 DIAGNOSIS — Z8551 Personal history of malignant neoplasm of bladder: Secondary | ICD-10-CM | POA: Diagnosis not present

## 2020-12-01 DIAGNOSIS — Z955 Presence of coronary angioplasty implant and graft: Secondary | ICD-10-CM | POA: Diagnosis not present

## 2020-12-01 DIAGNOSIS — C678 Malignant neoplasm of overlapping sites of bladder: Secondary | ICD-10-CM | POA: Diagnosis not present

## 2020-12-01 DIAGNOSIS — G4730 Sleep apnea, unspecified: Secondary | ICD-10-CM | POA: Diagnosis not present

## 2020-12-01 DIAGNOSIS — D090 Carcinoma in situ of bladder: Secondary | ICD-10-CM | POA: Diagnosis not present

## 2020-12-01 DIAGNOSIS — R310 Gross hematuria: Secondary | ICD-10-CM | POA: Diagnosis not present

## 2020-12-02 DIAGNOSIS — R310 Gross hematuria: Secondary | ICD-10-CM | POA: Diagnosis not present

## 2020-12-02 DIAGNOSIS — D090 Carcinoma in situ of bladder: Secondary | ICD-10-CM | POA: Diagnosis not present

## 2020-12-02 DIAGNOSIS — G4730 Sleep apnea, unspecified: Secondary | ICD-10-CM | POA: Diagnosis not present

## 2020-12-02 DIAGNOSIS — Z955 Presence of coronary angioplasty implant and graft: Secondary | ICD-10-CM | POA: Diagnosis not present

## 2020-12-02 DIAGNOSIS — N35911 Unspecified urethral stricture, male, meatal: Secondary | ICD-10-CM | POA: Diagnosis not present

## 2020-12-02 DIAGNOSIS — I2510 Atherosclerotic heart disease of native coronary artery without angina pectoris: Secondary | ICD-10-CM | POA: Diagnosis not present

## 2020-12-02 DIAGNOSIS — Z8551 Personal history of malignant neoplasm of bladder: Secondary | ICD-10-CM | POA: Diagnosis not present

## 2020-12-04 DIAGNOSIS — F5101 Primary insomnia: Secondary | ICD-10-CM | POA: Diagnosis not present

## 2020-12-04 DIAGNOSIS — G20 Parkinson's disease: Secondary | ICD-10-CM | POA: Diagnosis not present

## 2020-12-04 DIAGNOSIS — F4323 Adjustment disorder with mixed anxiety and depressed mood: Secondary | ICD-10-CM | POA: Diagnosis not present

## 2020-12-05 DIAGNOSIS — E119 Type 2 diabetes mellitus without complications: Secondary | ICD-10-CM | POA: Diagnosis not present

## 2020-12-05 DIAGNOSIS — R7881 Bacteremia: Secondary | ICD-10-CM | POA: Diagnosis not present

## 2020-12-05 DIAGNOSIS — F339 Major depressive disorder, recurrent, unspecified: Secondary | ICD-10-CM | POA: Diagnosis not present

## 2020-12-05 DIAGNOSIS — I2510 Atherosclerotic heart disease of native coronary artery without angina pectoris: Secondary | ICD-10-CM | POA: Diagnosis not present

## 2020-12-05 DIAGNOSIS — E1122 Type 2 diabetes mellitus with diabetic chronic kidney disease: Secondary | ICD-10-CM | POA: Diagnosis not present

## 2020-12-05 DIAGNOSIS — G9341 Metabolic encephalopathy: Secondary | ICD-10-CM | POA: Diagnosis not present

## 2020-12-05 DIAGNOSIS — I480 Paroxysmal atrial fibrillation: Secondary | ICD-10-CM | POA: Diagnosis not present

## 2020-12-05 DIAGNOSIS — E440 Moderate protein-calorie malnutrition: Secondary | ICD-10-CM | POA: Diagnosis not present

## 2020-12-05 DIAGNOSIS — F411 Generalized anxiety disorder: Secondary | ICD-10-CM | POA: Diagnosis not present

## 2020-12-05 DIAGNOSIS — I4891 Unspecified atrial fibrillation: Secondary | ICD-10-CM | POA: Diagnosis not present

## 2020-12-05 DIAGNOSIS — L89892 Pressure ulcer of other site, stage 2: Secondary | ICD-10-CM | POA: Diagnosis not present

## 2020-12-05 DIAGNOSIS — N390 Urinary tract infection, site not specified: Secondary | ICD-10-CM | POA: Diagnosis not present

## 2020-12-05 DIAGNOSIS — G20 Parkinson's disease: Secondary | ICD-10-CM | POA: Diagnosis not present

## 2020-12-05 DIAGNOSIS — G8929 Other chronic pain: Secondary | ICD-10-CM | POA: Diagnosis not present

## 2020-12-05 DIAGNOSIS — M6281 Muscle weakness (generalized): Secondary | ICD-10-CM | POA: Diagnosis not present

## 2020-12-05 DIAGNOSIS — R2681 Unsteadiness on feet: Secondary | ICD-10-CM | POA: Diagnosis not present

## 2020-12-05 DIAGNOSIS — A4151 Sepsis due to Escherichia coli [E. coli]: Secondary | ICD-10-CM | POA: Diagnosis not present

## 2020-12-05 DIAGNOSIS — I484 Atypical atrial flutter: Secondary | ICD-10-CM | POA: Diagnosis not present

## 2020-12-05 DIAGNOSIS — I10 Essential (primary) hypertension: Secondary | ICD-10-CM | POA: Diagnosis not present

## 2020-12-05 DIAGNOSIS — C679 Malignant neoplasm of bladder, unspecified: Secondary | ICD-10-CM | POA: Diagnosis not present

## 2020-12-07 ENCOUNTER — Other Ambulatory Visit: Payer: Self-pay | Admitting: *Deleted

## 2020-12-07 NOTE — Patient Outreach (Signed)
[place] Coordinator follow up. Member screened for potential [facility] Care Management needs.   Telephone call made to Ryo Garr (spouse/DPR) [phone]. Mrs. Whittaker reports member remains in [facility]. States she is not sure when Mr. Kuper will return home. Mrs. Malsch indicated that she prefers Probation officer to call back closer to Mr. Feltham' transition to home.   Will send request to [facility] SN[facility] SW to inquire about Mr. Pascall' transition plans.    Marthenia Rolling, MSN, RN,BSN [place] Acute Care Coordinator [phone] [facility]) [phone]  (Toll free office)

## 2020-12-11 DIAGNOSIS — G8929 Other chronic pain: Secondary | ICD-10-CM | POA: Diagnosis not present

## 2020-12-11 DIAGNOSIS — R2681 Unsteadiness on feet: Secondary | ICD-10-CM | POA: Diagnosis not present

## 2020-12-11 DIAGNOSIS — G20 Parkinson's disease: Secondary | ICD-10-CM | POA: Diagnosis not present

## 2020-12-11 DIAGNOSIS — R7881 Bacteremia: Secondary | ICD-10-CM | POA: Diagnosis not present

## 2020-12-11 DIAGNOSIS — E119 Type 2 diabetes mellitus without complications: Secondary | ICD-10-CM | POA: Diagnosis not present

## 2020-12-11 DIAGNOSIS — G9341 Metabolic encephalopathy: Secondary | ICD-10-CM | POA: Diagnosis not present

## 2020-12-11 DIAGNOSIS — I4891 Unspecified atrial fibrillation: Secondary | ICD-10-CM | POA: Diagnosis not present

## 2020-12-11 DIAGNOSIS — M6281 Muscle weakness (generalized): Secondary | ICD-10-CM | POA: Diagnosis not present

## 2020-12-11 DIAGNOSIS — N390 Urinary tract infection, site not specified: Secondary | ICD-10-CM | POA: Diagnosis not present

## 2020-12-11 DIAGNOSIS — F411 Generalized anxiety disorder: Secondary | ICD-10-CM | POA: Diagnosis not present

## 2020-12-13 ENCOUNTER — Other Ambulatory Visit: Payer: Self-pay | Admitting: *Deleted

## 2020-12-13 NOTE — Patient Outreach (Addendum)
Member screened for potential [facility] Care Management needs.   Second request sent to [facility] SW to inquire about transition plans.   Addendum: Update received from [facility] SN[facility] SW indicating member's transition date has not been set yet. Reports [name] is making slow but steady progress with therapy.   Transition plan has been to return home with spouse.  Will continue to follow while in SNF.    Jason Rolling, MSN, RN,BSN [place] Acute Care Coordinator [phone] [facility]) [phone]  (Toll free office)

## 2020-12-17 ENCOUNTER — Emergency Department
Admission: EM | Admit: 2020-12-17 | Discharge: 2020-12-17 | Disposition: A | Discharge status: Home / Self Care | Payer: Medicare Other | Attending: Emergency Medicine | Admitting: Emergency Medicine

## 2020-12-17 ENCOUNTER — Encounter: Payer: Self-pay | Admitting: Emergency Medicine

## 2020-12-17 DIAGNOSIS — E119 Type 2 diabetes mellitus without complications: Secondary | ICD-10-CM | POA: Insufficient documentation

## 2020-12-17 DIAGNOSIS — Z79891 Long term (current) use of opiate analgesic: Secondary | ICD-10-CM | POA: Insufficient documentation

## 2020-12-17 DIAGNOSIS — I10 Essential (primary) hypertension: Secondary | ICD-10-CM | POA: Diagnosis not present

## 2020-12-17 DIAGNOSIS — R8271 Bacteriuria: Secondary | ICD-10-CM | POA: Insufficient documentation

## 2020-12-17 DIAGNOSIS — R799 Abnormal finding of blood chemistry, unspecified: Secondary | ICD-10-CM | POA: Diagnosis present

## 2020-12-17 DIAGNOSIS — Z8554 Personal history of malignant neoplasm of ureter: Secondary | ICD-10-CM | POA: Diagnosis not present

## 2020-12-17 DIAGNOSIS — Z7984 Long term (current) use of oral hypoglycemic drugs: Secondary | ICD-10-CM | POA: Diagnosis not present

## 2020-12-17 DIAGNOSIS — Z79899 Other long term (current) drug therapy: Secondary | ICD-10-CM | POA: Insufficient documentation

## 2020-12-17 DIAGNOSIS — I2510 Atherosclerotic heart disease of native coronary artery without angina pectoris: Secondary | ICD-10-CM | POA: Diagnosis not present

## 2020-12-17 LAB — URINALYSIS, COMPLETE (UACMP) WITH MICROSCOPIC
Bilirubin Urine: NEGATIVE
Glucose, UA: NEGATIVE mg/dL
Ketones, ur: NEGATIVE mg/dL
Nitrite: POSITIVE — AB
Protein, ur: 30 mg/dL — AB
RBC / HPF: >50 RBC/hpf — ABNORMAL HIGH (ref 0–5)
Specific Gravity, Urine: 1.015 (ref 1.005–1.030)
WBC, UA: >50 WBC/hpf — ABNORMAL HIGH (ref 0–5)
pH: 7.5 (ref 5.0–8.0)

## 2020-12-17 LAB — BASIC METABOLIC PANEL
Anion gap: 11 (ref 5–15)
BUN: 15 mg/dL (ref 8–23)
CO2: 26 mmol/L (ref 22–32)
Calcium: 8.9 mg/dL (ref 8.9–10.3)
Chloride: 98 mmol/L (ref 98–111)
Creatinine, Ser: 1.1 mg/dL (ref 0.61–1.24)
GFR, Estimated: >60 mL/min (ref >60)
Glucose, Bld: 153 mg/dL — ABNORMAL HIGH (ref 70–99)
Potassium: 3.7 mmol/L (ref 3.5–5.1)
Sodium: 135 mmol/L (ref 135–145)

## 2020-12-17 LAB — CBC WITH DIFFERENTIAL/PLATELET
Abs Immature Granulocytes: 0.17 10*3/uL — ABNORMAL HIGH (ref 0.00–0.07)
Basophils Absolute: 0 10*3/uL (ref 0.0–0.1)
Basophils Relative: 0 %
Eosinophils Absolute: 0.1 10*3/uL (ref 0.0–0.5)
Eosinophils Relative: 1 %
HCT: 36.3 % — ABNORMAL LOW (ref 39.0–52.0)
Hemoglobin: 12.4 g/dL — ABNORMAL LOW (ref 13.0–17.0)
Immature Granulocytes: 1 %
Lymphocytes Relative: 12 %
Lymphs Abs: 1.6 10*3/uL (ref 0.7–4.0)
MCH: 29.9 pg (ref 26.0–34.0)
MCHC: 34.2 g/dL (ref 30.0–36.0)
MCV: 87.5 fL (ref 80.0–100.0)
Monocytes Absolute: 0.6 10*3/uL (ref 0.1–1.0)
Monocytes Relative: 4 %
Neutro Abs: 10.9 10*3/uL — ABNORMAL HIGH (ref 1.7–7.7)
Neutrophils Relative %: 82 %
Platelets: 341 10*3/uL (ref 150–400)
RBC: 4.15 MIL/uL — ABNORMAL LOW (ref 4.22–5.81)
RDW: 13.7 % (ref 11.5–15.5)
WBC: 13.3 10*3/uL — ABNORMAL HIGH (ref 4.0–10.5)
nRBC: 0 % (ref 0.0–0.2)

## 2020-12-17 MED ORDER — ACETAMINOPHEN 325 MG PO TABS
650.0000 mg | ORAL_TABLET | Freq: Once | ORAL | Status: AC
  Administered 2020-12-17: 650 mg via ORAL
  Filled 2020-12-17: qty 2

## 2020-12-17 NOTE — ED Triage Notes (Signed)
Per EMS, Pt, from [facility], c/o not feeling well.  Facility reports Pt has an UTI.  Pt denies pain and n/v/d.  Pt reports he has been eating well.    EMS sts Pt was sent out because they were unable to start an IV. Pt given ativan prior to departure.

## 2020-12-17 NOTE — ED Notes (Signed)
Report given to Doctor, general practice at [facility].

## 2020-12-17 NOTE — ED Provider Notes (Signed)
[facility]  ____________________________________________   Event Date/Time   First MD Initiated Contact with Patient 12/17/20 1754     (approximate)  I have reviewed the triage vital signs and the nursing notes.   HISTORY  Chief Complaint Abnormal Lab    HPI Jason Hartman is a 85 y.o. male past medical history of hypertension, atrial fibrillation on Eliquis bladder cancer who presents with a positive urine culture from his facility.  Urinalysis was drawn 2 days ago.  Culture resulted which shows lactose reventing gram-negative rods.  Patient has grown ESBL in the past and has needed a ertapenem.  He endorses feeling somewhat unwell but denies nausea, vomiting, abdominal or flank pain.  He denies fevers or chills.  He is eating and drinking.  He has had intermittent dysuria for a long time but this is not new.  Denies hematuria, urgency, frequency or inability to empty his bladder.         Past Medical History:  Diagnosis Date   Cellulitis and abscess of leg, except foot    Hypertension    Obstructive sleep apnea (adult) (pediatric)    Pain in joint, pelvic region and thigh    Pneumonia, organism unspecified(486)    Restless legs syndrome (RLS)    RLS (restless legs syndrome) 09/01/2012   Thoracic or lumbosacral neuritis or radiculitis, unspecified    Type II or unspecified type diabetes mellitus without mention of complication, not stated as uncontrolled    Unspecified disease of pericardium    Unspecified hereditary and idiopathic peripheral neuropathy     Patient Active Problem List   Diagnosis Date Noted   Malnutrition of moderate degree 10/27/2020   UTI (urinary tract infection) 10/25/2020   Sepsis ([place]) 10/12/2020   AMS (altered mental status)    PAF (paroxysmal atrial fibrillation) ([place])    Acute metabolic encephalopathy [id number]   Anxiety 09/09/2020   SIRS (systemic inflammatory response syndrome) ([place]) 09/09/2020   Diaphoresis     Slow transit constipation 08/07/2020   Abdominal aortic aneurysm without rupture ([place]) 03/17/2020   Abnormal gait 03/17/2020   Ascending aorta dilatation ([place]) 03/17/2020   Diabetic renal disease ([place]) 03/17/2020   Recurrent UTI 07/16/2019   Hypertensive urgency 04/26/2019   Malignant HTN with heart disease, w/o CHF, w/o chronic kidney disease 04/26/2019   Elevated troponin    LFTs abnormal    Statin intolerance 01/15/2019   Penis pain 04/10/2018   Urethra cancer ([place]) 02/25/2018   Lower extremity edema 06/09/2017   Peptic ulcer disease 05/16/2017   GERD (gastroesophageal reflux disease) 05/16/2017   Chronic kidney disease, stage 3a ([place]) 04/11/2017   Lower urinary tract symptoms (LUTS) 04/15/2016   Narcotic dependence ([place]) 08/31/2015   Imbalance 08/31/2015   Diabetic peripheral neuropathy ([place]) 08/31/2015   Other malaise and fatigue 05/02/2015   Chronic fatigue 05/02/2015   Panic disorder without agoraphobia 03/22/2015   Hyperlipidemia 02/03/2014   Thrombocytopenia ([place]) 08/18/2013   Parkinsonism ([place]) 08/18/2013   Other disorders of lung 08/18/2013   Organic impotence 08/18/2013   Memory loss 08/18/2013   Low back pain 08/18/2013   Iron deficiency anemia 08/18/2013   Hypercalcemia 08/18/2013   Cellulitis of right leg 08/18/2013   Atherosclerotic heart disease of native coronary artery without angina pectoris 08/18/2013   RLS (restless legs syndrome) 09/01/2012   HERPES SIMPLEX INFECTION 11/06/2006   Type II diabetes mellitus ([place]) 11/06/2006   HYPOGONADISM 11/06/2006   VITAMIN D DEFICIENCY 11/06/2006   ANXIETY 11/06/2006  OBSTRUCTIVE SLEEP APNEA 11/06/2006   RESTLESS LEG SYNDROME 11/06/2006   Essential hypertension 11/06/2006   BENIGN PROSTATIC HYPERTROPHY 11/06/2006   ROSACEA 11/06/2006   OSTEOARTHRITIS 11/06/2006   INSOMNIA 11/06/2006   HYPERGLYCEMIA 11/06/2006   COLONIC POLYPS, HX OF 11/06/2006    Past Surgical History:  Procedure Laterality Date   ANAL  FISSURE REPAIR     pericarditis  2009    Prior to Admission medications   Medication Sig Start Date End Date Taking? Authorizing Provider  acetaminophen (TYLENOL) 500 MG tablet Take 500 mg by mouth 3 (three) times daily.    [provider]  amiodarone (PACERONE) 200 MG tablet Take 1 tablet (200 mg total) by mouth daily. 09/26/20   Mercy Riding, MD  amitriptyline (ELAVIL) 10 MG tablet Take 1 tablet (10 mg total) by mouth at bedtime. 10/29/20   Nita Sells, MD  apixaban (ELIQUIS) 5 MG TABS tablet Take 1 tablet (5 mg total) by mouth 2 (two) times daily. 09/29/20 10/29/20  Mercy Riding, MD  DULoxetine (CYMBALTA) 30 MG capsule Take 1 capsule (30 mg total) by mouth daily. 10/29/20   Nita Sells, MD  ferrous sulfate 325 (65 FE) MG EC tablet Take 325 mg by mouth daily.  05/28/18   [provider]  gabapentin (NEURONTIN) 300 MG capsule Take 1 capsule (300 mg total) by mouth at bedtime. 09/25/20   Mercy Riding, MD  LANTUS SOLOSTAR 100 UNIT/ML Solostar Pen Inject 8 Units into the skin 2 (two) times daily. 09/25/20   Mercy Riding, MD  melatonin 5 MG TABS Take 5 mg by mouth at bedtime.    [provider]  metFORMIN (GLUCOPHAGE-XR) 500 MG 24 hr tablet Take 500 mg by mouth daily with breakfast. 01/17/20   [provider]  metoprolol tartrate (LOPRESSOR) 25 MG tablet Take 0.5 tablets (12.5 mg total) by mouth 2 (two) times daily. 09/25/20   Mercy Riding, MD  [facility] powder Apply 1 application topically in the morning and at bedtime. To rash on back and groin and sacrum 10/18/20   [provider]  polyethylene glycol powder (GLYCOLAX/MIRALAX) 17 GM/SCOOP powder Take 17 g by mouth daily.    [provider]  Rotigotine (NEUPRO) 8 MG/24HR PT24 Place 1 patch onto the skin every evening.    [provider]  Semaglutide,0.25 or 0.'5MG'$ /DOS, (OZEMPIC, 0.25 OR 0.5 MG/DOSE,) 2 MG/1.5ML SOPN Inject 0.5 mg into the skin every Tuesday.    [provider]  senna-docusate (SENOKOT-S) 8.6-50 MG tablet Take 1 tablet by mouth 2 (two) times daily as needed for moderate constipation. 09/25/20   Mercy Riding, MD  thiamine 100 MG tablet Take 1 tablet (100 mg total) by mouth daily. 09/26/20   Mercy Riding, MD  traMADol (ULTRAM) 50 MG tablet Take 1 tablet (50 mg total) by mouth daily. 10/29/20   Nita Sells, MD    Allergies Oxycodone, Iodinated diagnostic agents, Iodine, Linezolid, Methadone hcl, Promethazine, Isovue [iopamidol], Morphine sulfate, Omnipaque [iohexol], Other, Oxycodone hcl, Quetiapine, Renografin [diatrizoate], Statins, Zolpidem, Atorvastatin, Carbidopa-levodopa, Chlorhexidine gluconate [chlorhexidine], Clindamycin, Colesevelam, Doxazosin, Lovastatin, and Rosuvastatin  Family History  Problem Relation Age of Onset   Diabetes Father     Social History Social History   Tobacco Use   Smoking status: Former    Types: Cigarettes    Quit date: 09/01/1980    Years since quitting: 40.3   Smokeless tobacco: Never  Vaping Use   Vaping Use: Never used  Substance Use Topics  Alcohol use: No   Drug use: No    Review of Systems   Review of Systems  Constitutional:  Negative for appetite change, chills and fever.  Respiratory:  Negative for shortness of breath.   Cardiovascular:  Negative for chest pain.  Gastrointestinal:  Negative for abdominal pain, constipation, diarrhea, nausea and vomiting.  Genitourinary:  Negative for difficulty urinating, dysuria and hematuria.  All other systems reviewed and are negative.  Physical Exam Updated Vital Signs BP (!) 148/78   Pulse 80   Temp 98.1 F (36.7 C) (Oral)   Resp 11   SpO2 98%   Physical Exam   LABS (all labs ordered are listed, but only abnormal results are displayed)  Labs Reviewed  URINALYSIS, COMPLETE (UACMP) WITH MICROSCOPIC - Abnormal; Notable for the following components:      Result Value   APPearance CLOUDY (*)    Hgb urine dipstick LARGE  (*)    Protein, ur 30 (*)    Nitrite POSITIVE (*)    Leukocytes,Ua LARGE (*)    RBC / HPF >50 (*)    WBC, UA >50 (*)    Bacteria, UA MANY (*)    All other components within normal limits  CBC WITH DIFFERENTIAL/PLATELET - Abnormal; Notable for the following components:   WBC 13.3 (*)    RBC 4.15 (*)    Hemoglobin 12.4 (*)    HCT 36.3 (*)    Neutro Abs 10.9 (*)    Abs Immature Granulocytes 0.17 (*)    All other components within normal limits  BASIC METABOLIC PANEL - Abnormal; Notable for the following components:   Glucose, Bld 153 (*)    All other components within normal limits   ____________________________________________  EKG  N/a ____________________________________________  RADIOLOGY Almeta Monas, personally viewed and evaluated these images (plain radiographs) as part of my medical decision making, as well as reviewing the written report by the radiologist.  ED MD interpretation:  n/a    ____________________________________________   PROCEDURES  Procedure(s) performed (including Critical Care):  Procedures   ____________________________________________   INITIAL IMPRESSION / ASSESSMENT AND PLAN / ED COURSE     85 year old male with prior UTIs and bladder cancer who presents with a positive urine culture from his facility.  He has grown Klebsiella that is ESBL in the past and required ertapenem for treatment.  The culture today is likely the same.  Patient however has minimal symptoms of UTI, he says he feels generally unwell but denies fevers specific new urinary symptoms inability tolerate p.o. abdominal or flank pain.  We obtained screening labs which show stable renal function and a mild leukocytosis.  I suspect that this patient is likely colonized with ESBL and that this culture may not represent acute infection.  I discussed the case with infectious disease physician on-call Dr. Tommy Medal that this may very well be colonization and despite his  leukocytosis he does not recommend treating at this time based on my description of the patient.  I discussed with the patient that if he develops fevers, inability to void, hematuria or nausea/vomiting he should return to the emergency department as at this point he may need treatment.  Clinical Course as of 12/17/20 1947  Sun Dec 17, 2020  1852 WBC(!): 13.3 [KM]    Clinical Course User Index [KM] Rada Hay, MD     ____________________________________________   FINAL CLINICAL IMPRESSION(S) / ED DIAGNOSES  Final diagnoses:  Bacteriuria     ED Discharge  Orders     None        Note:  This document was prepared using Dragon voice recognition software and may include unintentional dictation errors.    Rada Hay, MD 12/17/20 [phone]

## 2020-12-17 NOTE — Discharge Instructions (Signed)
We saw the urine culture that was positive for ESBL.  Had this in the past as well.  We checked his kidney function today which was stable.  He had a mildly elevated white blood cell count but was afebrile.  Because he is not having acute symptoms of urinary tract infection we think this is most likely related to colonization.  We recommend avoiding treatment at this time to prevent any further resistance.  If the patient were to develop nausea, vomiting, abdominal or flank pain or fevers, then he would need to be treated and should return to the emergency department.

## 2020-12-18 ENCOUNTER — Other Ambulatory Visit: Payer: Self-pay | Admitting: Geriatric Medicine

## 2020-12-18 DIAGNOSIS — F4323 Adjustment disorder with mixed anxiety and depressed mood: Secondary | ICD-10-CM | POA: Diagnosis not present

## 2020-12-18 DIAGNOSIS — I77810 Thoracic aortic ectasia: Secondary | ICD-10-CM

## 2020-12-18 DIAGNOSIS — G20 Parkinson's disease: Secondary | ICD-10-CM | POA: Diagnosis not present

## 2020-12-18 DIAGNOSIS — F5101 Primary insomnia: Secondary | ICD-10-CM | POA: Diagnosis not present

## 2020-12-26 DIAGNOSIS — C679 Malignant neoplasm of bladder, unspecified: Secondary | ICD-10-CM | POA: Diagnosis not present

## 2020-12-26 DIAGNOSIS — G20 Parkinson's disease: Secondary | ICD-10-CM | POA: Diagnosis not present

## 2020-12-27 DIAGNOSIS — S50312A Abrasion of left elbow, initial encounter: Secondary | ICD-10-CM | POA: Diagnosis not present

## 2020-12-27 DIAGNOSIS — R296 Repeated falls: Secondary | ICD-10-CM | POA: Diagnosis not present

## 2020-12-28 ENCOUNTER — Other Ambulatory Visit: Payer: Self-pay | Admitting: *Deleted

## 2020-12-28 DIAGNOSIS — G4733 Obstructive sleep apnea (adult) (pediatric): Secondary | ICD-10-CM | POA: Diagnosis not present

## 2020-12-28 DIAGNOSIS — N401 Enlarged prostate with lower urinary tract symptoms: Secondary | ICD-10-CM | POA: Diagnosis not present

## 2020-12-28 DIAGNOSIS — M25552 Pain in left hip: Secondary | ICD-10-CM | POA: Diagnosis not present

## 2020-12-28 DIAGNOSIS — E782 Mixed hyperlipidemia: Secondary | ICD-10-CM | POA: Diagnosis not present

## 2020-12-28 DIAGNOSIS — I480 Paroxysmal atrial fibrillation: Secondary | ICD-10-CM | POA: Diagnosis not present

## 2020-12-28 DIAGNOSIS — Z7984 Long term (current) use of oral hypoglycemic drugs: Secondary | ICD-10-CM | POA: Diagnosis not present

## 2020-12-28 DIAGNOSIS — E1122 Type 2 diabetes mellitus with diabetic chronic kidney disease: Secondary | ICD-10-CM | POA: Diagnosis not present

## 2020-12-28 DIAGNOSIS — Z794 Long term (current) use of insulin: Secondary | ICD-10-CM | POA: Diagnosis not present

## 2020-12-28 DIAGNOSIS — I10 Essential (primary) hypertension: Secondary | ICD-10-CM | POA: Diagnosis not present

## 2020-12-28 DIAGNOSIS — I2510 Atherosclerotic heart disease of native coronary artery without angina pectoris: Secondary | ICD-10-CM | POA: Diagnosis not present

## 2020-12-28 DIAGNOSIS — G20 Parkinson's disease: Secondary | ICD-10-CM | POA: Diagnosis not present

## 2020-12-28 DIAGNOSIS — M6281 Muscle weakness (generalized): Secondary | ICD-10-CM | POA: Diagnosis not present

## 2020-12-28 NOTE — Patient Outreach (Signed)
[facility]- Acute Care Coordinator follow up. [facility]. Albrecht resides in [facility].   Update from [place] indicating transition plan remains to return home with spouse. Continues to work with therapy. SW reports member likely to return home in 2 weeks.   Will plan outreach again to member's spouse to discuss [place] Management follow up.    Marthenia Rolling, MSN, RN,BSN [place] Acute Care Coordinator [phone] [facility]) [phone]  (Toll free office)

## 2020-12-29 ENCOUNTER — Ambulatory Visit: Payer: Medicare Other | Admitting: Orthopaedic Surgery

## 2020-12-29 ENCOUNTER — Emergency Department (HOSPITAL_COMMUNITY): Payer: Medicare Other

## 2020-12-29 ENCOUNTER — Encounter (HOSPITAL_COMMUNITY): Payer: Self-pay | Admitting: Emergency Medicine

## 2020-12-29 ENCOUNTER — Other Ambulatory Visit: Payer: Self-pay

## 2020-12-29 ENCOUNTER — Inpatient Hospital Stay (HOSPITAL_COMMUNITY)
Admission: EM | Admit: 2020-12-29 | Discharge: 2021-01-08 | DRG: 522 | Disposition: A | Discharge status: Skilled Nursing Facility | Payer: Medicare Other | Source: Skilled Nursing Facility | Attending: Internal Medicine | Admitting: Internal Medicine

## 2020-12-29 DIAGNOSIS — N50812 Left testicular pain: Secondary | ICD-10-CM | POA: Diagnosis not present

## 2020-12-29 DIAGNOSIS — N50819 Testicular pain, unspecified: Secondary | ICD-10-CM | POA: Diagnosis not present

## 2020-12-29 DIAGNOSIS — Z7901 Long term (current) use of anticoagulants: Secondary | ICD-10-CM | POA: Diagnosis not present

## 2020-12-29 DIAGNOSIS — Z20822 Contact with and (suspected) exposure to covid-19: Secondary | ICD-10-CM | POA: Diagnosis not present

## 2020-12-29 DIAGNOSIS — E1142 Type 2 diabetes mellitus with diabetic polyneuropathy: Secondary | ICD-10-CM | POA: Diagnosis not present

## 2020-12-29 DIAGNOSIS — Y9301 Activity, walking, marching and hiking: Secondary | ICD-10-CM | POA: Diagnosis present

## 2020-12-29 DIAGNOSIS — E871 Hypo-osmolality and hyponatremia: Secondary | ICD-10-CM | POA: Diagnosis not present

## 2020-12-29 DIAGNOSIS — G2581 Restless legs syndrome: Secondary | ICD-10-CM | POA: Diagnosis not present

## 2020-12-29 DIAGNOSIS — N442 Benign cyst of testis: Secondary | ICD-10-CM | POA: Diagnosis not present

## 2020-12-29 DIAGNOSIS — R278 Other lack of coordination: Secondary | ICD-10-CM | POA: Diagnosis not present

## 2020-12-29 DIAGNOSIS — E860 Dehydration: Secondary | ICD-10-CM | POA: Diagnosis present

## 2020-12-29 DIAGNOSIS — N200 Calculus of kidney: Secondary | ICD-10-CM | POA: Diagnosis present

## 2020-12-29 DIAGNOSIS — Z8551 Personal history of malignant neoplasm of bladder: Secondary | ICD-10-CM

## 2020-12-29 DIAGNOSIS — N1831 Chronic kidney disease, stage 3a: Secondary | ICD-10-CM | POA: Diagnosis not present

## 2020-12-29 DIAGNOSIS — J9811 Atelectasis: Secondary | ICD-10-CM | POA: Diagnosis not present

## 2020-12-29 DIAGNOSIS — Z7984 Long term (current) use of oral hypoglycemic drugs: Secondary | ICD-10-CM

## 2020-12-29 DIAGNOSIS — W010XXA Fall on same level from slipping, tripping and stumbling without subsequent striking against object, initial encounter: Secondary | ICD-10-CM | POA: Diagnosis present

## 2020-12-29 DIAGNOSIS — Z794 Long term (current) use of insulin: Secondary | ICD-10-CM | POA: Diagnosis not present

## 2020-12-29 DIAGNOSIS — Z885 Allergy status to narcotic agent status: Secondary | ICD-10-CM

## 2020-12-29 DIAGNOSIS — F39 Unspecified mood [affective] disorder: Secondary | ICD-10-CM | POA: Diagnosis not present

## 2020-12-29 DIAGNOSIS — M6281 Muscle weakness (generalized): Secondary | ICD-10-CM | POA: Diagnosis not present

## 2020-12-29 DIAGNOSIS — E440 Moderate protein-calorie malnutrition: Secondary | ICD-10-CM | POA: Diagnosis not present

## 2020-12-29 DIAGNOSIS — Y92003 Bedroom of unspecified non-institutional (private) residence as the place of occurrence of the external cause: Secondary | ICD-10-CM | POA: Diagnosis not present

## 2020-12-29 DIAGNOSIS — I2510 Atherosclerotic heart disease of native coronary artery without angina pectoris: Secondary | ICD-10-CM | POA: Diagnosis present

## 2020-12-29 DIAGNOSIS — F419 Anxiety disorder, unspecified: Secondary | ICD-10-CM | POA: Diagnosis not present

## 2020-12-29 DIAGNOSIS — Z87891 Personal history of nicotine dependence: Secondary | ICD-10-CM

## 2020-12-29 DIAGNOSIS — S72009A Fracture of unspecified part of neck of unspecified femur, initial encounter for closed fracture: Secondary | ICD-10-CM | POA: Diagnosis present

## 2020-12-29 DIAGNOSIS — R410 Disorientation, unspecified: Secondary | ICD-10-CM | POA: Diagnosis not present

## 2020-12-29 DIAGNOSIS — G9340 Encephalopathy, unspecified: Secondary | ICD-10-CM | POA: Diagnosis not present

## 2020-12-29 DIAGNOSIS — E785 Hyperlipidemia, unspecified: Secondary | ICD-10-CM | POA: Diagnosis present

## 2020-12-29 DIAGNOSIS — Z833 Family history of diabetes mellitus: Secondary | ICD-10-CM

## 2020-12-29 DIAGNOSIS — N433 Hydrocele, unspecified: Secondary | ICD-10-CM | POA: Diagnosis present

## 2020-12-29 DIAGNOSIS — I10 Essential (primary) hypertension: Secondary | ICD-10-CM | POA: Diagnosis present

## 2020-12-29 DIAGNOSIS — I700 Atherosclerosis of aorta: Secondary | ICD-10-CM | POA: Diagnosis not present

## 2020-12-29 DIAGNOSIS — M19042 Primary osteoarthritis, left hand: Secondary | ICD-10-CM | POA: Diagnosis present

## 2020-12-29 DIAGNOSIS — E1122 Type 2 diabetes mellitus with diabetic chronic kidney disease: Secondary | ICD-10-CM | POA: Diagnosis not present

## 2020-12-29 DIAGNOSIS — Z87440 Personal history of urinary (tract) infections: Secondary | ICD-10-CM

## 2020-12-29 DIAGNOSIS — R52 Pain, unspecified: Secondary | ICD-10-CM

## 2020-12-29 DIAGNOSIS — G20 Parkinson's disease: Secondary | ICD-10-CM | POA: Diagnosis not present

## 2020-12-29 DIAGNOSIS — G8929 Other chronic pain: Secondary | ICD-10-CM | POA: Diagnosis present

## 2020-12-29 DIAGNOSIS — G3184 Mild cognitive impairment, so stated: Secondary | ICD-10-CM | POA: Diagnosis present

## 2020-12-29 DIAGNOSIS — Z6821 Body mass index (BMI) 21.0-21.9, adult: Secondary | ICD-10-CM

## 2020-12-29 DIAGNOSIS — I482 Chronic atrial fibrillation, unspecified: Secondary | ICD-10-CM | POA: Diagnosis not present

## 2020-12-29 DIAGNOSIS — D62 Acute posthemorrhagic anemia: Secondary | ICD-10-CM | POA: Diagnosis not present

## 2020-12-29 DIAGNOSIS — R109 Unspecified abdominal pain: Secondary | ICD-10-CM | POA: Diagnosis not present

## 2020-12-29 DIAGNOSIS — S72002A Fracture of unspecified part of neck of left femur, initial encounter for closed fracture: Secondary | ICD-10-CM | POA: Diagnosis not present

## 2020-12-29 DIAGNOSIS — S50312A Abrasion of left elbow, initial encounter: Secondary | ICD-10-CM | POA: Diagnosis present

## 2020-12-29 DIAGNOSIS — I440 Atrioventricular block, first degree: Secondary | ICD-10-CM | POA: Diagnosis present

## 2020-12-29 DIAGNOSIS — S72012A Unspecified intracapsular fracture of left femur, initial encounter for closed fracture: Principal | ICD-10-CM | POA: Diagnosis present

## 2020-12-29 DIAGNOSIS — Z888 Allergy status to other drugs, medicaments and biological substances status: Secondary | ICD-10-CM

## 2020-12-29 DIAGNOSIS — M9702XA Periprosthetic fracture around internal prosthetic left hip joint, initial encounter: Secondary | ICD-10-CM | POA: Diagnosis not present

## 2020-12-29 DIAGNOSIS — M19012 Primary osteoarthritis, left shoulder: Secondary | ICD-10-CM | POA: Diagnosis not present

## 2020-12-29 DIAGNOSIS — R509 Fever, unspecified: Secondary | ICD-10-CM | POA: Diagnosis not present

## 2020-12-29 DIAGNOSIS — M25512 Pain in left shoulder: Secondary | ICD-10-CM | POA: Diagnosis not present

## 2020-12-29 DIAGNOSIS — M25552 Pain in left hip: Secondary | ICD-10-CM | POA: Diagnosis not present

## 2020-12-29 DIAGNOSIS — S72002G Fracture of unspecified part of neck of left femur, subsequent encounter for closed fracture with delayed healing: Secondary | ICD-10-CM | POA: Diagnosis not present

## 2020-12-29 DIAGNOSIS — Z87898 Personal history of other specified conditions: Secondary | ICD-10-CM

## 2020-12-29 DIAGNOSIS — E119 Type 2 diabetes mellitus without complications: Secondary | ICD-10-CM | POA: Diagnosis not present

## 2020-12-29 DIAGNOSIS — M25522 Pain in left elbow: Secondary | ICD-10-CM | POA: Diagnosis not present

## 2020-12-29 DIAGNOSIS — I480 Paroxysmal atrial fibrillation: Secondary | ICD-10-CM | POA: Diagnosis present

## 2020-12-29 DIAGNOSIS — R41841 Cognitive communication deficit: Secondary | ICD-10-CM | POA: Diagnosis not present

## 2020-12-29 DIAGNOSIS — K219 Gastro-esophageal reflux disease without esophagitis: Secondary | ICD-10-CM | POA: Diagnosis present

## 2020-12-29 DIAGNOSIS — G4733 Obstructive sleep apnea (adult) (pediatric): Secondary | ICD-10-CM | POA: Diagnosis not present

## 2020-12-29 DIAGNOSIS — R296 Repeated falls: Secondary | ICD-10-CM | POA: Diagnosis not present

## 2020-12-29 DIAGNOSIS — N390 Urinary tract infection, site not specified: Secondary | ICD-10-CM | POA: Diagnosis present

## 2020-12-29 DIAGNOSIS — W19XXXA Unspecified fall, initial encounter: Secondary | ICD-10-CM

## 2020-12-29 DIAGNOSIS — Z7401 Bed confinement status: Secondary | ICD-10-CM | POA: Diagnosis not present

## 2020-12-29 DIAGNOSIS — M189 Osteoarthritis of first carpometacarpal joint, unspecified: Secondary | ICD-10-CM | POA: Diagnosis not present

## 2020-12-29 DIAGNOSIS — Z96642 Presence of left artificial hip joint: Secondary | ICD-10-CM | POA: Diagnosis not present

## 2020-12-29 DIAGNOSIS — Z79899 Other long term (current) drug therapy: Secondary | ICD-10-CM

## 2020-12-29 DIAGNOSIS — Z91041 Radiographic dye allergy status: Secondary | ICD-10-CM

## 2020-12-29 DIAGNOSIS — A419 Sepsis, unspecified organism: Secondary | ICD-10-CM | POA: Diagnosis not present

## 2020-12-29 DIAGNOSIS — F05 Delirium due to known physiological condition: Secondary | ICD-10-CM | POA: Diagnosis not present

## 2020-12-29 DIAGNOSIS — N281 Cyst of kidney, acquired: Secondary | ICD-10-CM | POA: Diagnosis not present

## 2020-12-29 DIAGNOSIS — R6520 Severe sepsis without septic shock: Secondary | ICD-10-CM | POA: Diagnosis not present

## 2020-12-29 DIAGNOSIS — Z471 Aftercare following joint replacement surgery: Secondary | ICD-10-CM | POA: Diagnosis not present

## 2020-12-29 DIAGNOSIS — R1312 Dysphagia, oropharyngeal phase: Secondary | ICD-10-CM | POA: Diagnosis not present

## 2020-12-29 DIAGNOSIS — R102 Pelvic and perineal pain: Secondary | ICD-10-CM | POA: Diagnosis not present

## 2020-12-29 DIAGNOSIS — M1990 Unspecified osteoarthritis, unspecified site: Secondary | ICD-10-CM | POA: Diagnosis not present

## 2020-12-29 DIAGNOSIS — F411 Generalized anxiety disorder: Secondary | ICD-10-CM | POA: Diagnosis not present

## 2020-12-29 DIAGNOSIS — R2681 Unsteadiness on feet: Secondary | ICD-10-CM | POA: Diagnosis not present

## 2020-12-29 DIAGNOSIS — R531 Weakness: Secondary | ICD-10-CM | POA: Diagnosis not present

## 2020-12-29 HISTORY — DX: Atherosclerotic heart disease of native coronary artery without angina pectoris: I25.10

## 2020-12-29 LAB — CBC
HCT: 35.9 % — ABNORMAL LOW (ref 39.0–52.0)
Hemoglobin: 12 g/dL — ABNORMAL LOW (ref 13.0–17.0)
MCH: 30 pg (ref 26.0–34.0)
MCHC: 33.4 g/dL (ref 30.0–36.0)
MCV: 89.8 fL (ref 80.0–100.0)
Platelets: 223 10*3/uL (ref 150–400)
RBC: 4 MIL/uL — ABNORMAL LOW (ref 4.22–5.81)
RDW: 14.1 % (ref 11.5–15.5)
WBC: 9.6 10*3/uL (ref 4.0–10.5)
nRBC: 0 % (ref 0.0–0.2)

## 2020-12-29 LAB — RESP PANEL BY RT-PCR (FLU A&B, COVID) ARPGX2
Influenza A by PCR: NEGATIVE
Influenza B by PCR: NEGATIVE
SARS Coronavirus 2 by RT PCR: NEGATIVE

## 2020-12-29 LAB — CBG MONITORING, ED: Glucose-Capillary: 153 mg/dL — ABNORMAL HIGH (ref 70–99)

## 2020-12-29 LAB — GLUCOSE, CAPILLARY: Glucose-Capillary: 161 mg/dL — ABNORMAL HIGH (ref 70–99)

## 2020-12-29 LAB — BASIC METABOLIC PANEL
Anion gap: 9 (ref 5–15)
BUN: 18 mg/dL (ref 8–23)
CO2: 26 mmol/L (ref 22–32)
Calcium: 8.9 mg/dL (ref 8.9–10.3)
Chloride: 101 mmol/L (ref 98–111)
Creatinine, Ser: 0.96 mg/dL (ref 0.61–1.24)
GFR, Estimated: >60 mL/min (ref >60)
Glucose, Bld: 132 mg/dL — ABNORMAL HIGH (ref 70–99)
Potassium: 4.3 mmol/L (ref 3.5–5.1)
Sodium: 136 mmol/L (ref 135–145)

## 2020-12-29 IMAGING — CT CT HIP*L* W/O CM
3 series · 17 of 46 positions shown, 20 images · non-contrast
Comparison: Pelvis and left femur x-rays from same day.

CLINICAL DATA: Left hip pain after fall.

EXAM:
CT OF THE LEFT HIP WITHOUT CONTRAST
TECHNIQUE: Multidetector CT imaging of the left hip was performed according to
the standard protocol. Multiplanar CT image reconstructions were
also generated.

[Series 7: hip 3.0 st ax · axial · 0.45mm/px · z∈[+752,+884]mm · 13 of 50 slices shown, 16 images]
[im 4/50  soft-tissue]
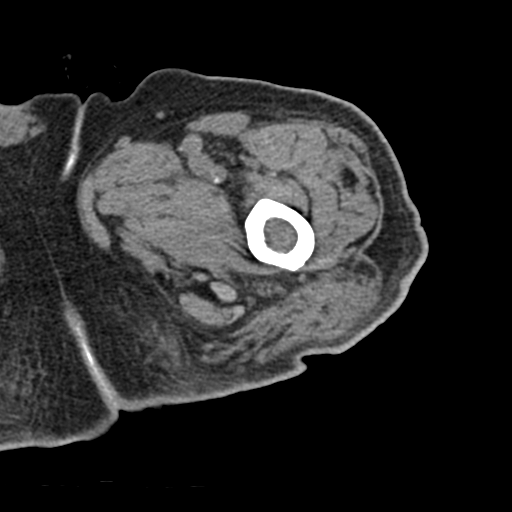
[im 4/50  bone]
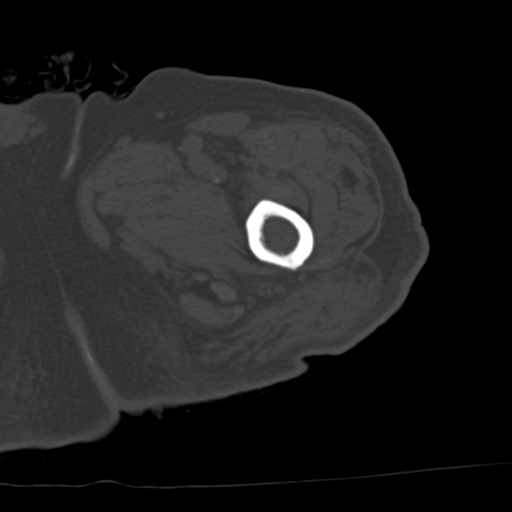
[im 8/50  soft-tissue]
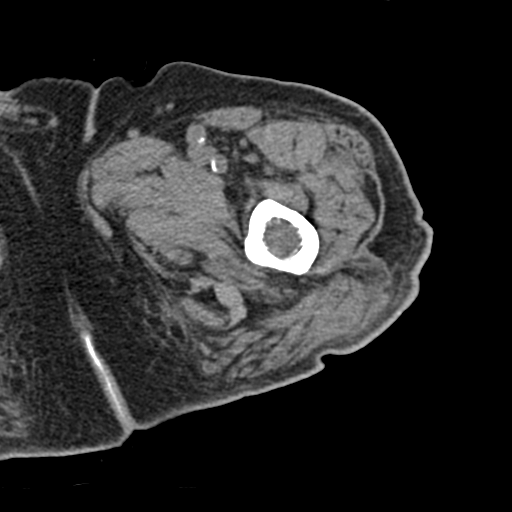
[im 13/50  soft-tissue]
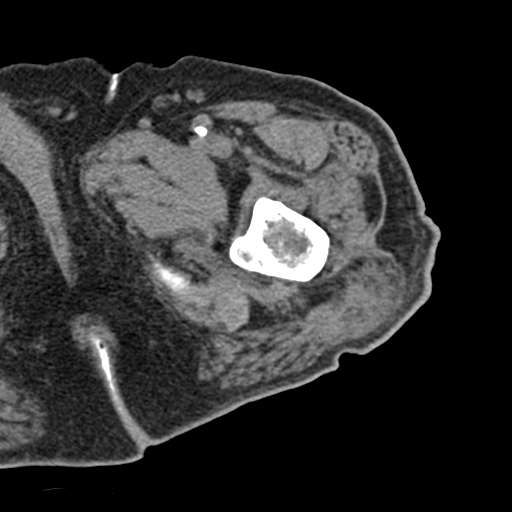
[im 18/50  soft-tissue]
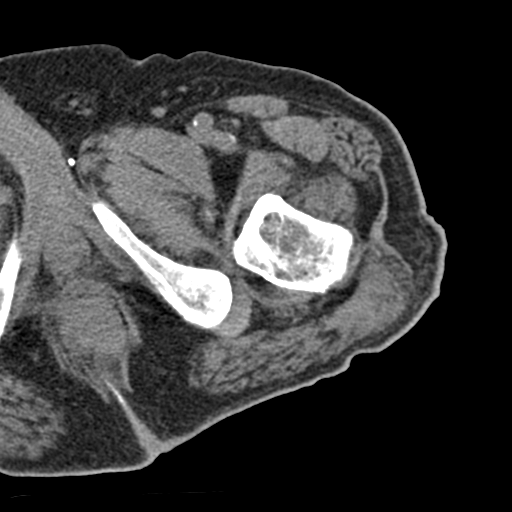
[im 23/50  soft-tissue]
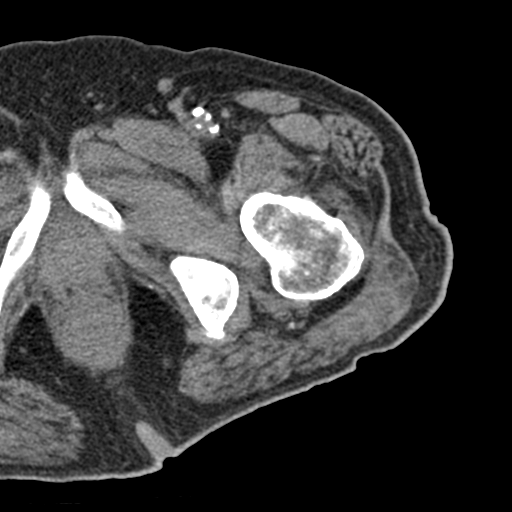
[im 27/50  soft-tissue]
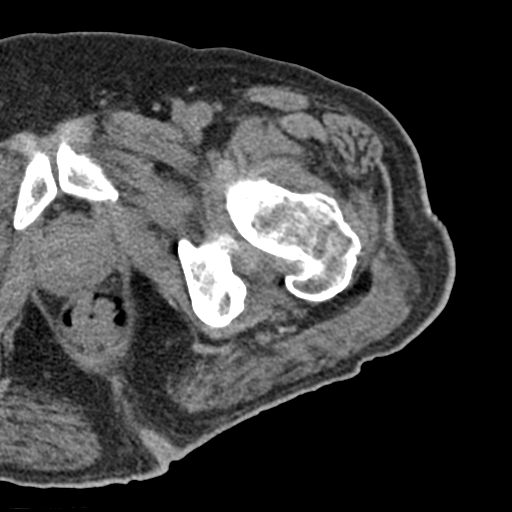
[im 32/50  soft-tissue]
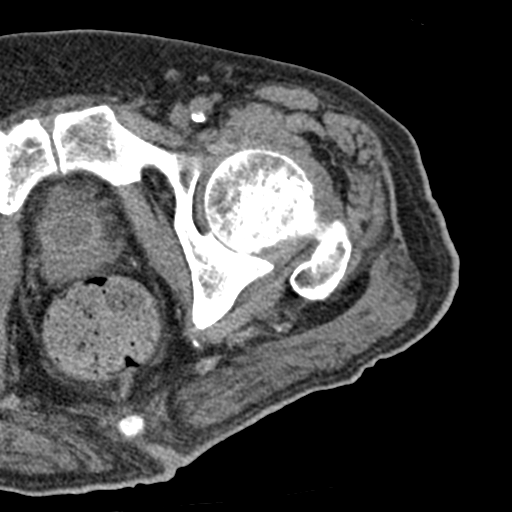
[im 37/50  soft-tissue]
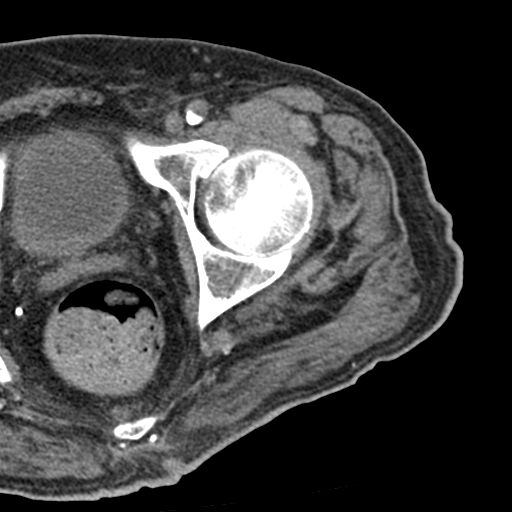
[im 42/50  soft-tissue]
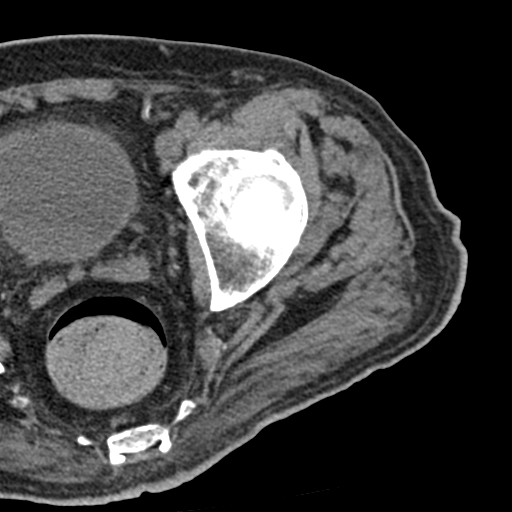
[im 42/50  bone]
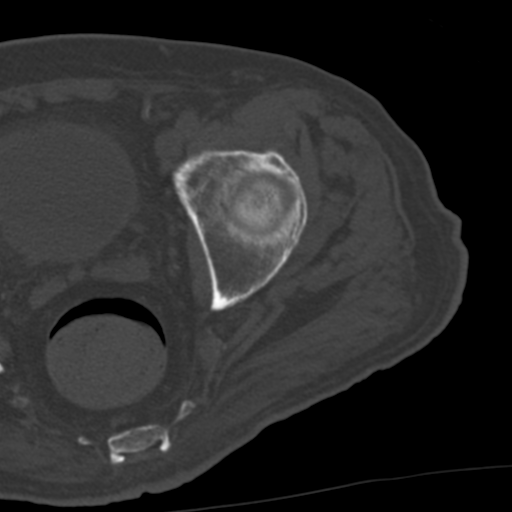
[im 43/50  lung]
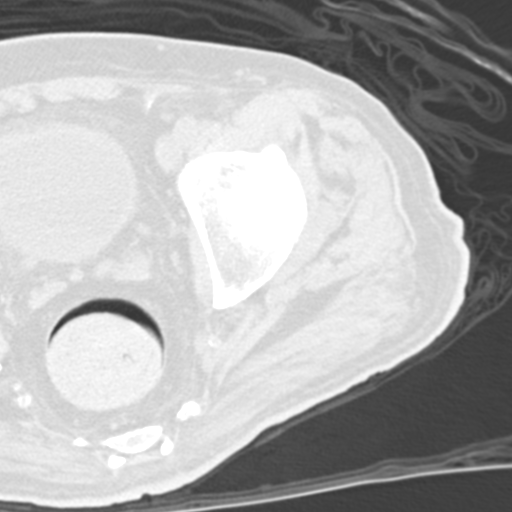
[im 45/50  lung]
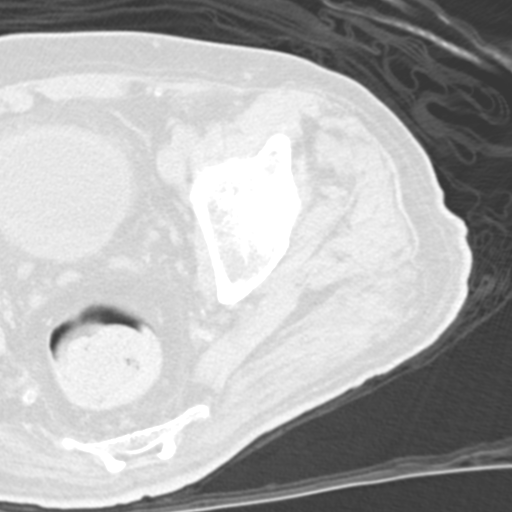
[im 46/50  soft-tissue]
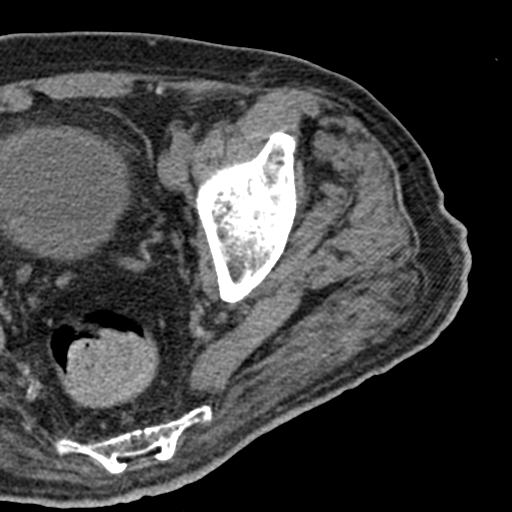
[im 46/50  lung]
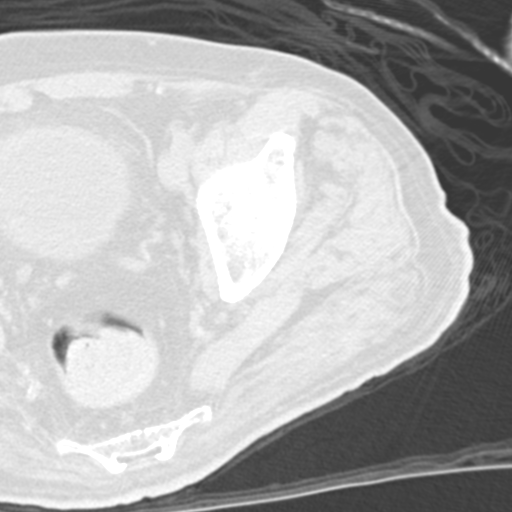
[im 48/50  lung]
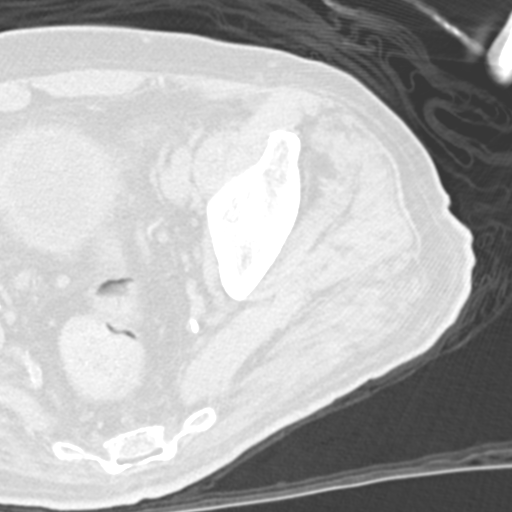

[Series 604: cor st · coronal · 0.45mm/px · 3 of 57 slices shown]
[im 19/57  soft-tissue]
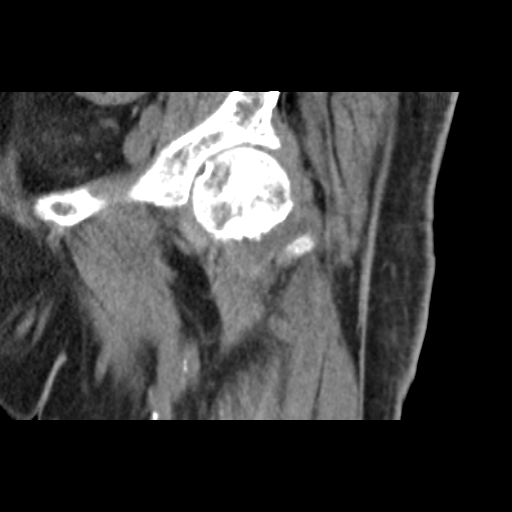
[im 25/57  soft-tissue]
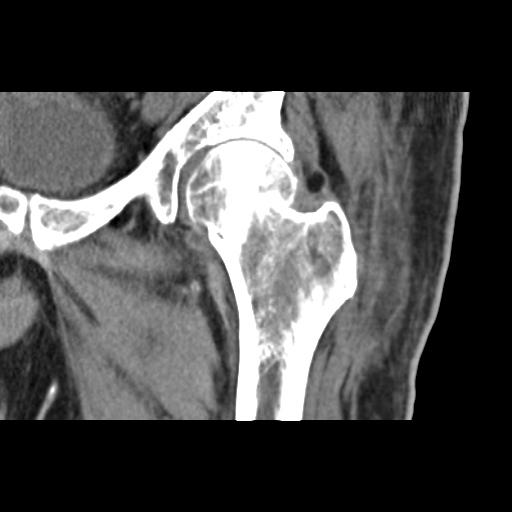
[im 32/57  soft-tissue]
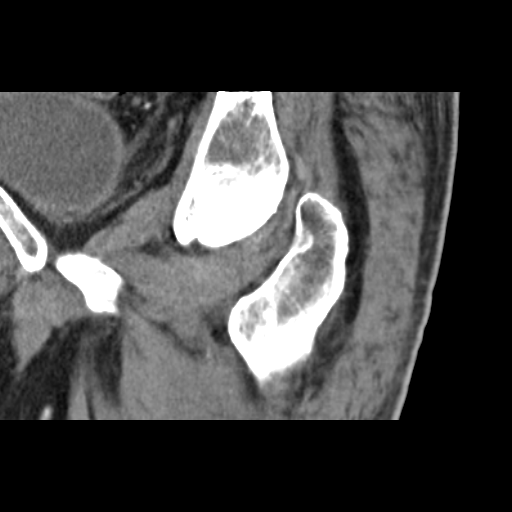

[Series 605: sag st · sagittal · 0.45mm/px · 1 of 69 slices shown]
[im 23/69  soft-tissue]
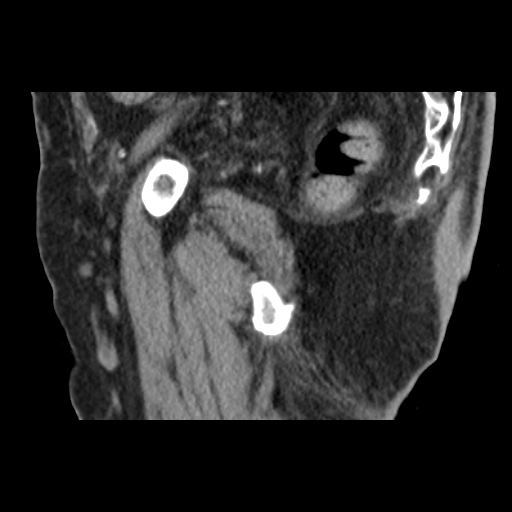

[17 of 46 positions shown; findings below may reference images not displayed]

FINDINGS: Bones/Joint/Cartilage

Acute mildly impacted subcapital left femoral neck fracture. No
dislocation. Joint spaces are preserved. No joint effusion.

Ligaments

Ligaments are suboptimally evaluated by CT.

Muscles and Tendons
Grossly intact.

Soft tissue
No fluid collection or hematoma.  No soft tissue mass.
IMPRESSION: 1. Acute mildly impacted subcapital left femoral neck fracture.

## 2020-12-29 IMAGING — CR DG PELVIS 1-2V
1 series · 1 of 1 positions shown · non-contrast
Comparison: [DATE].

CLINICAL DATA: Left hip pain after fall.

EXAM:
PELVIS - 1-2 VIEW

[pelvis ap]
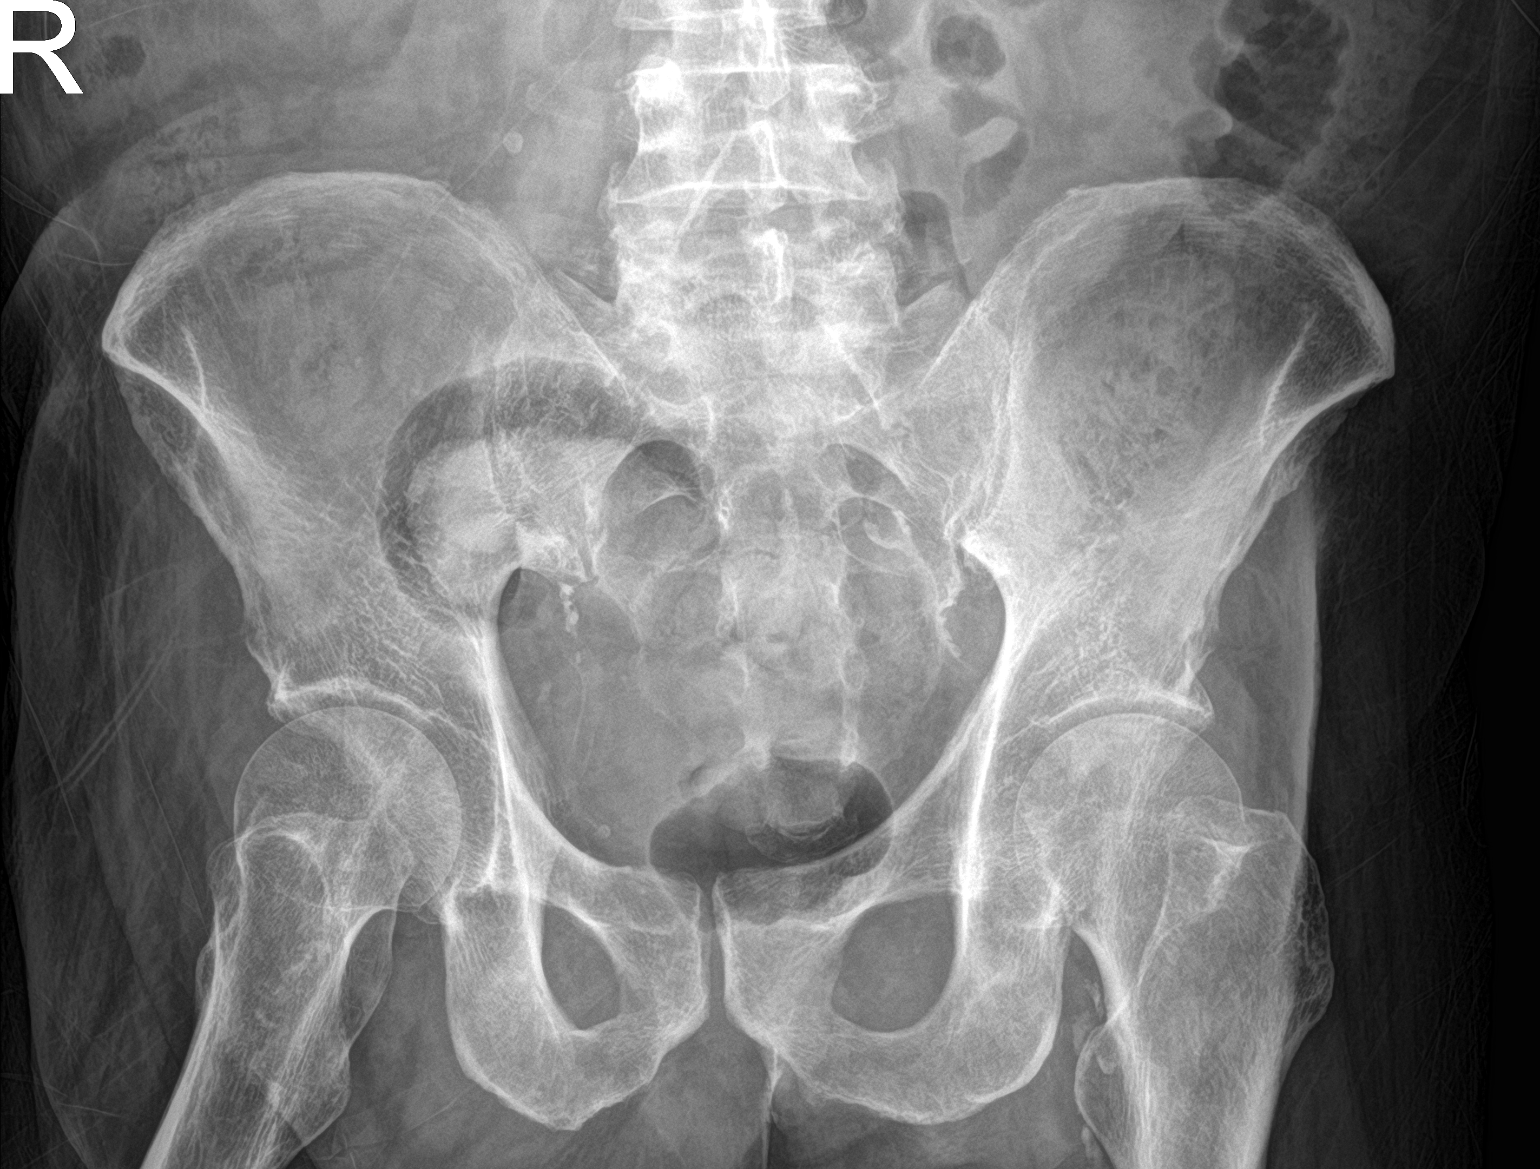

[1 of 1 positions shown; findings below may reference images not displayed]

FINDINGS: There is no evidence of pelvic fracture or diastasis. No pelvic bone
lesions are seen.
IMPRESSION: Negative.

## 2020-12-29 IMAGING — CR DG FEMUR 2+V*L*
4 series · 4 of 4 positions shown · non-contrast
Comparison: None.

CLINICAL DATA: LEFT hip pain after fall

EXAM:
LEFT FEMUR 2 VIEWS

[femur ap (1 of 2)]
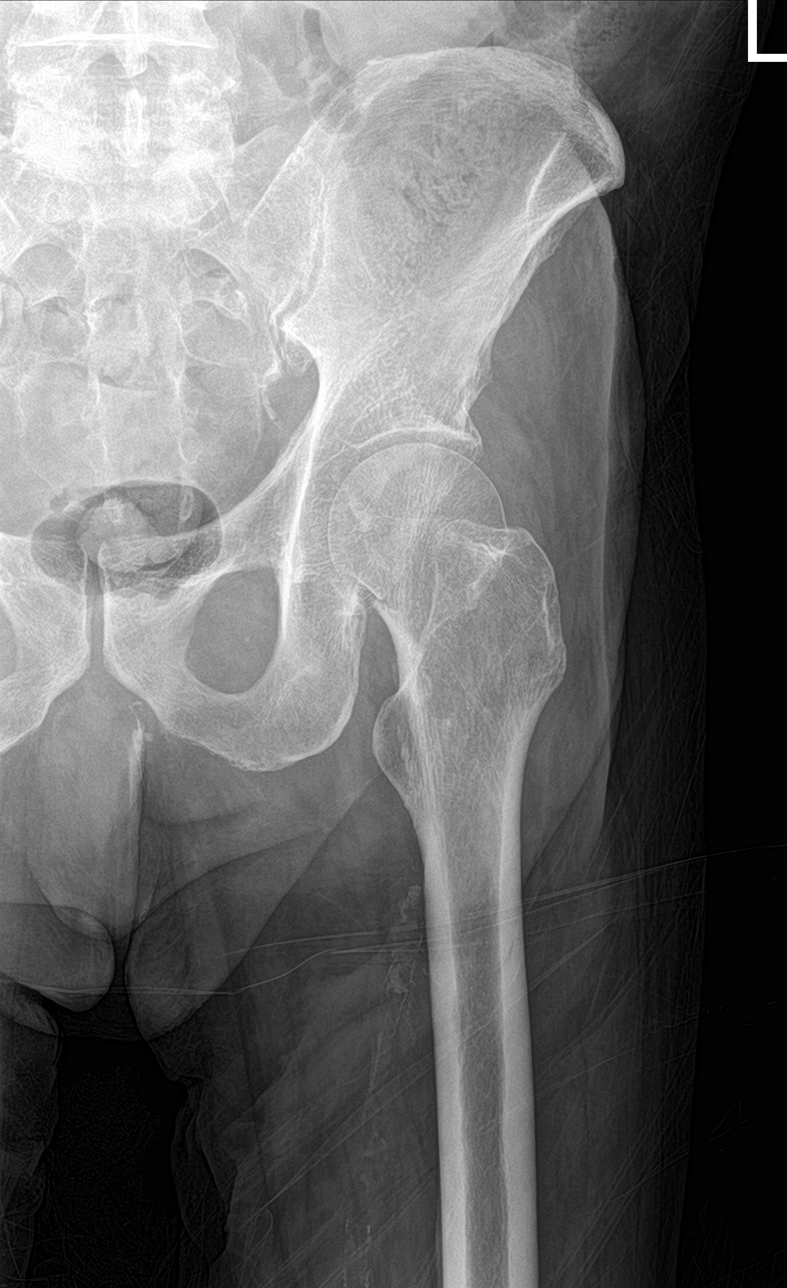

[femur ap (2 of 2)]
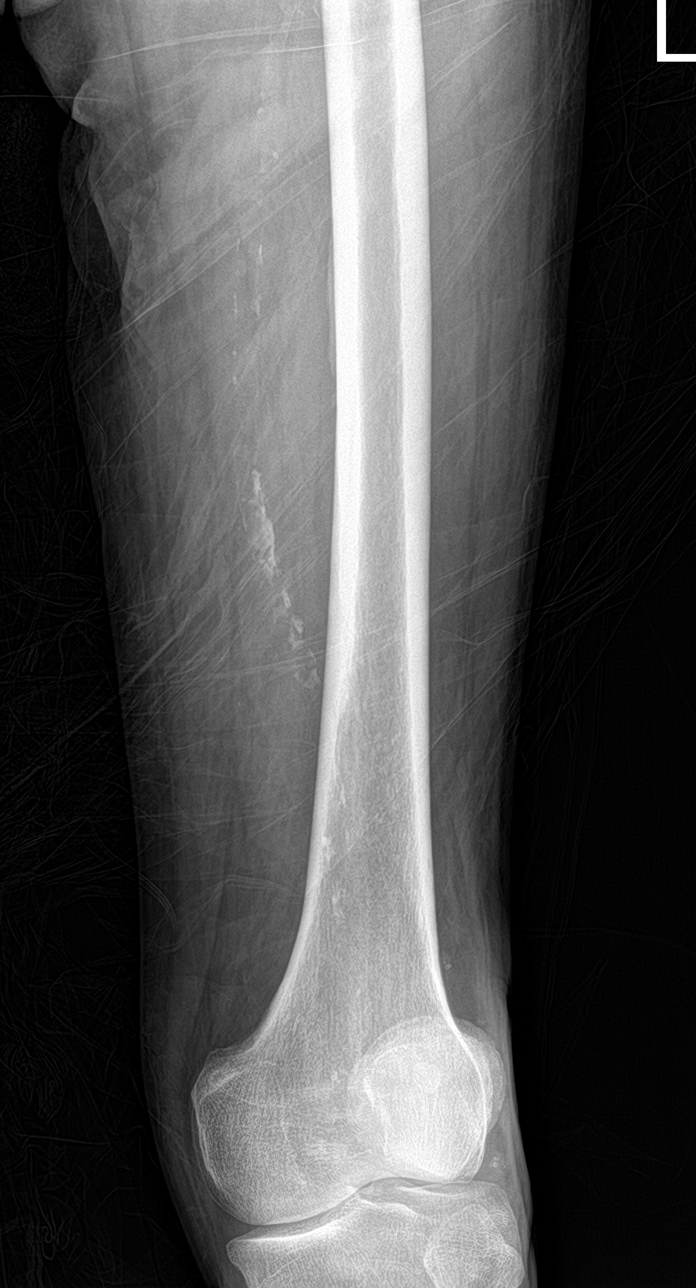

[femur lat (1 of 2)]
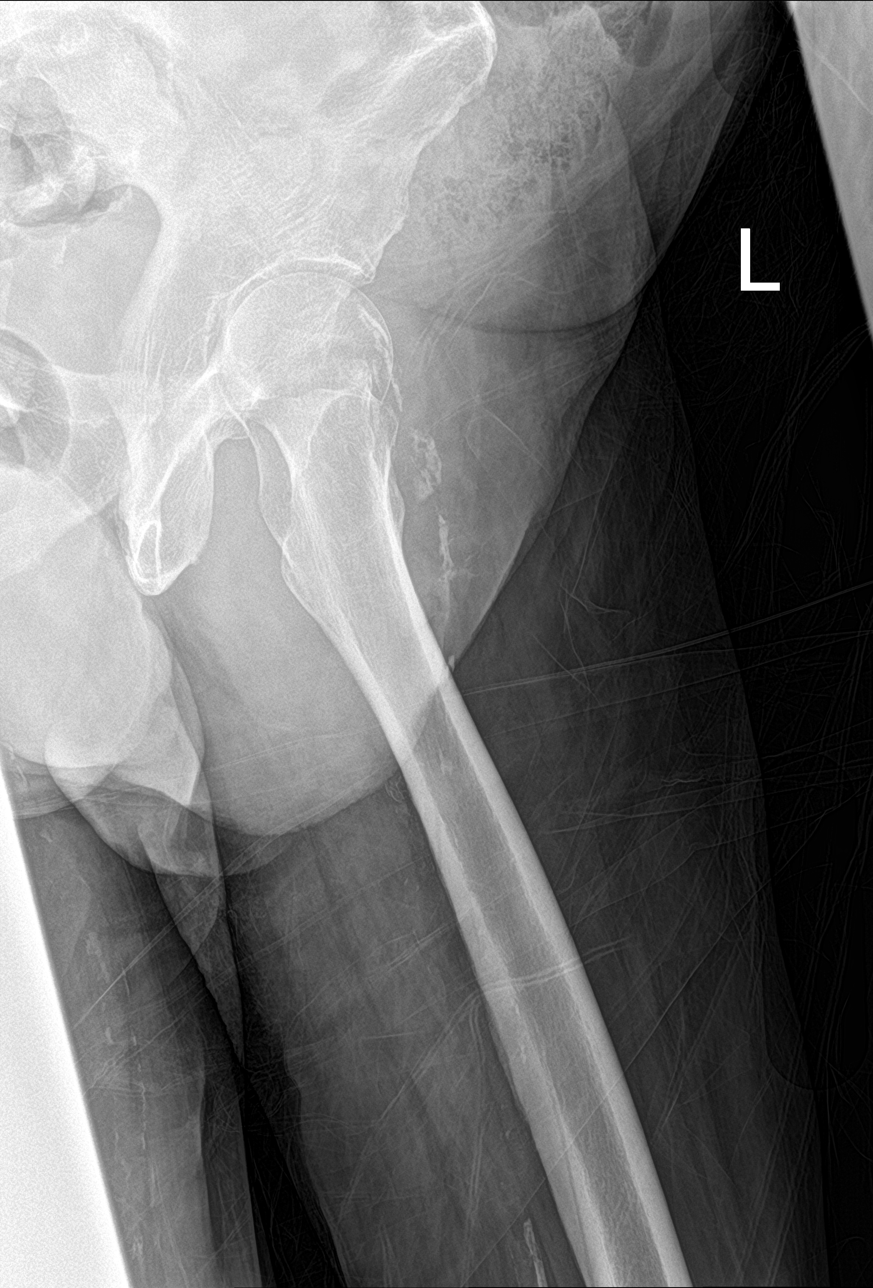

[femur lat (2 of 2)]
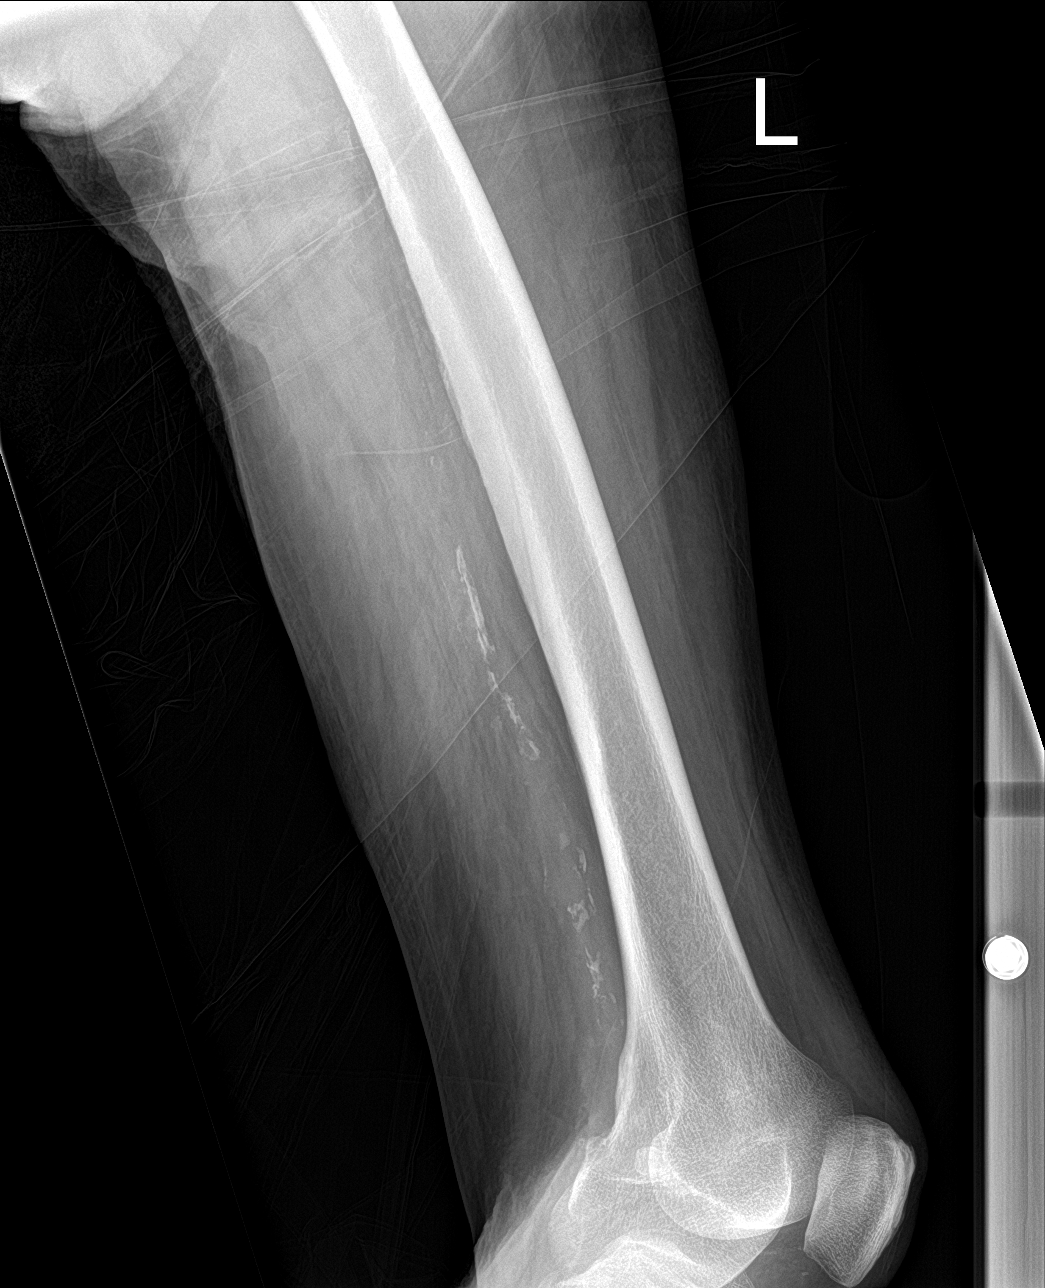

[4 of 4 positions shown; findings below may reference images not displayed]

FINDINGS: There is varus angulation of the LEFT femoral head in relation to
the femoral neck. There is a cortical step-off along the inferior
medial margin of the femoral neck.

No fracture of the femoral shaft. The knee joint appears normal on
two views.
IMPRESSION: Concern for LEFT femoral neck fracture with varus angulation.
Consider dedicated exam of the LEFT hip/pelvis or CT LEFT hip.

## 2020-12-29 IMAGING — CR DG SHOULDER 2+V*L*
2 series · 2 of 2 positions shown · non-contrast
Comparison: None.

CLINICAL DATA: Left shoulder pain after fall.

EXAM:
LEFT SHOULDER - 2+ VIEW

[shoulder grashey]
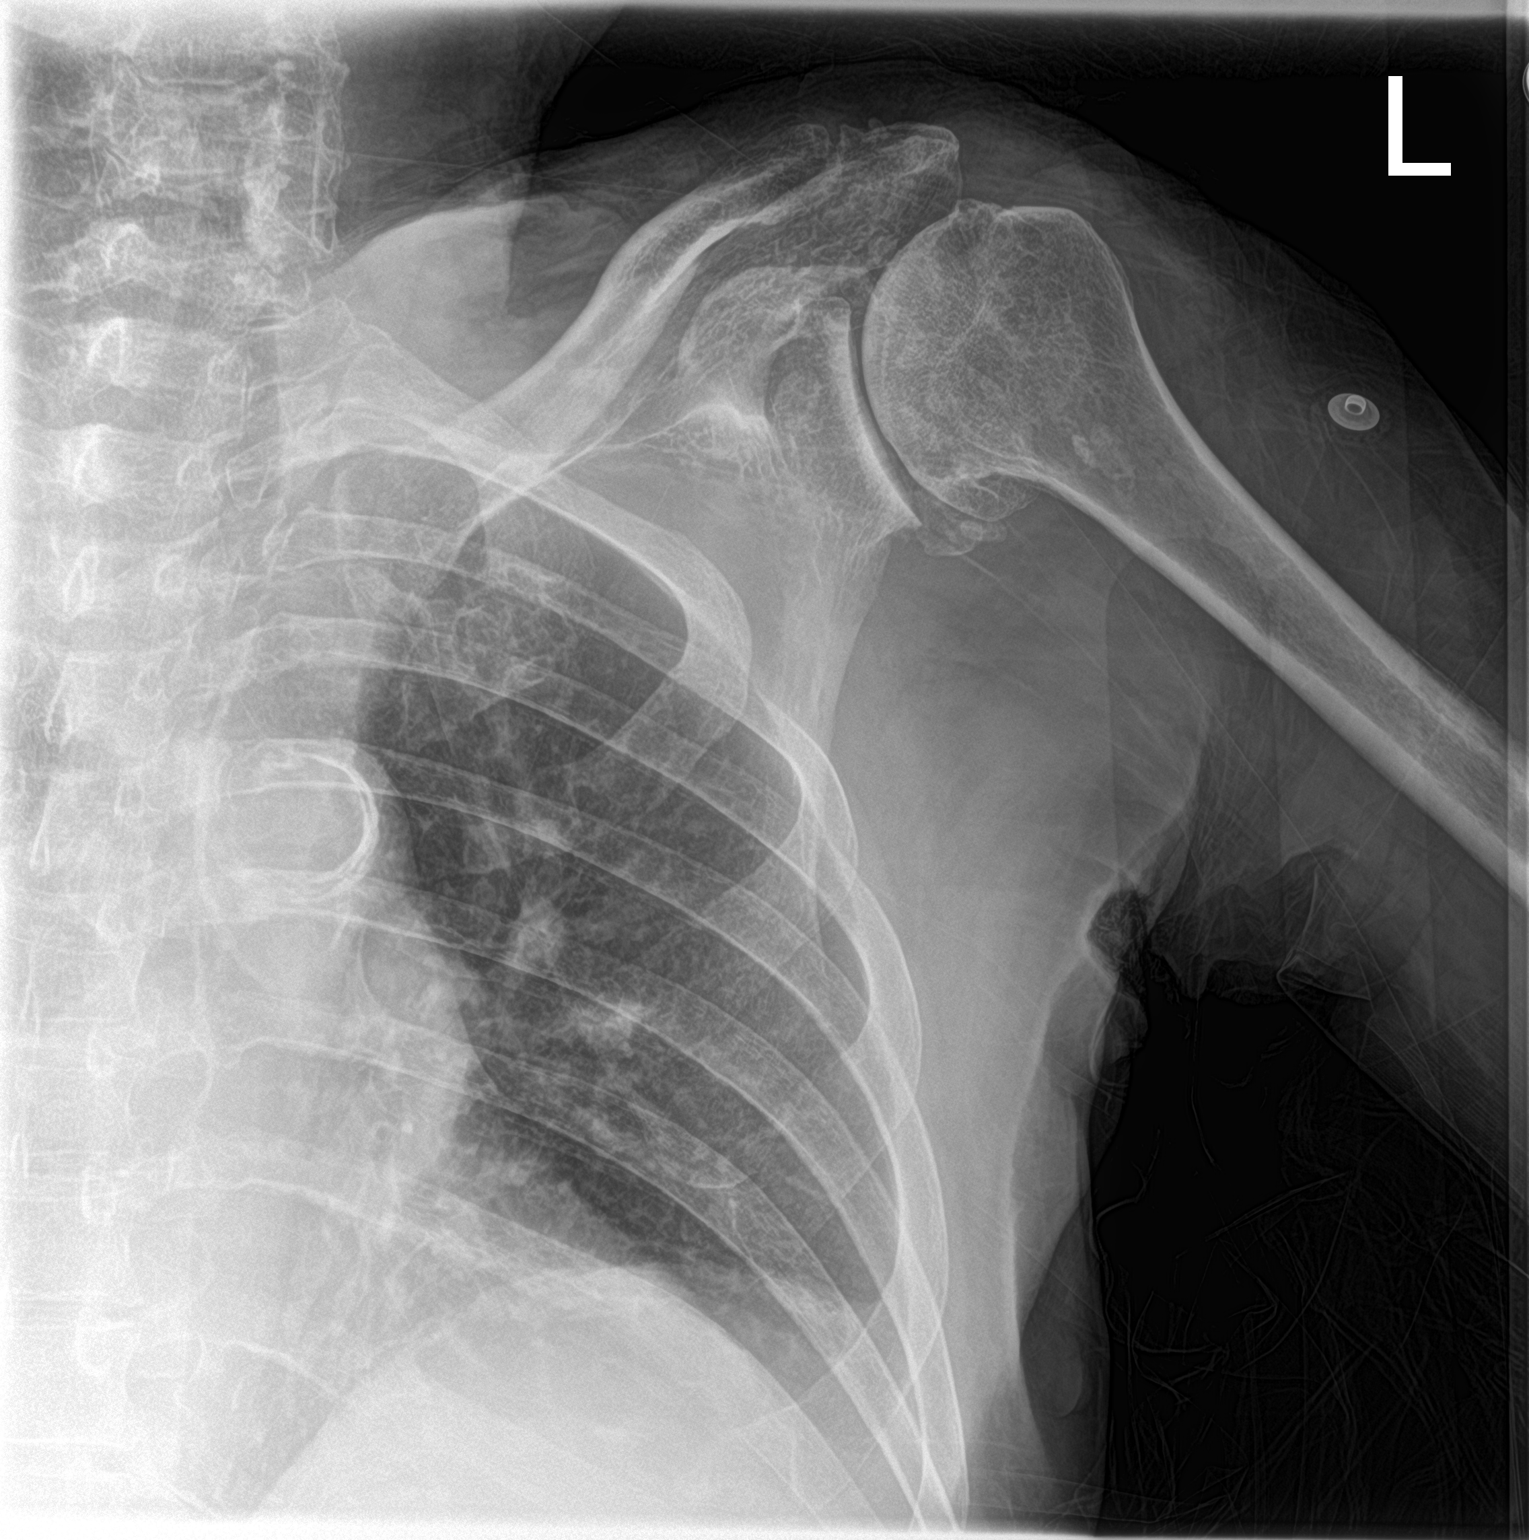

[shoulder y view]
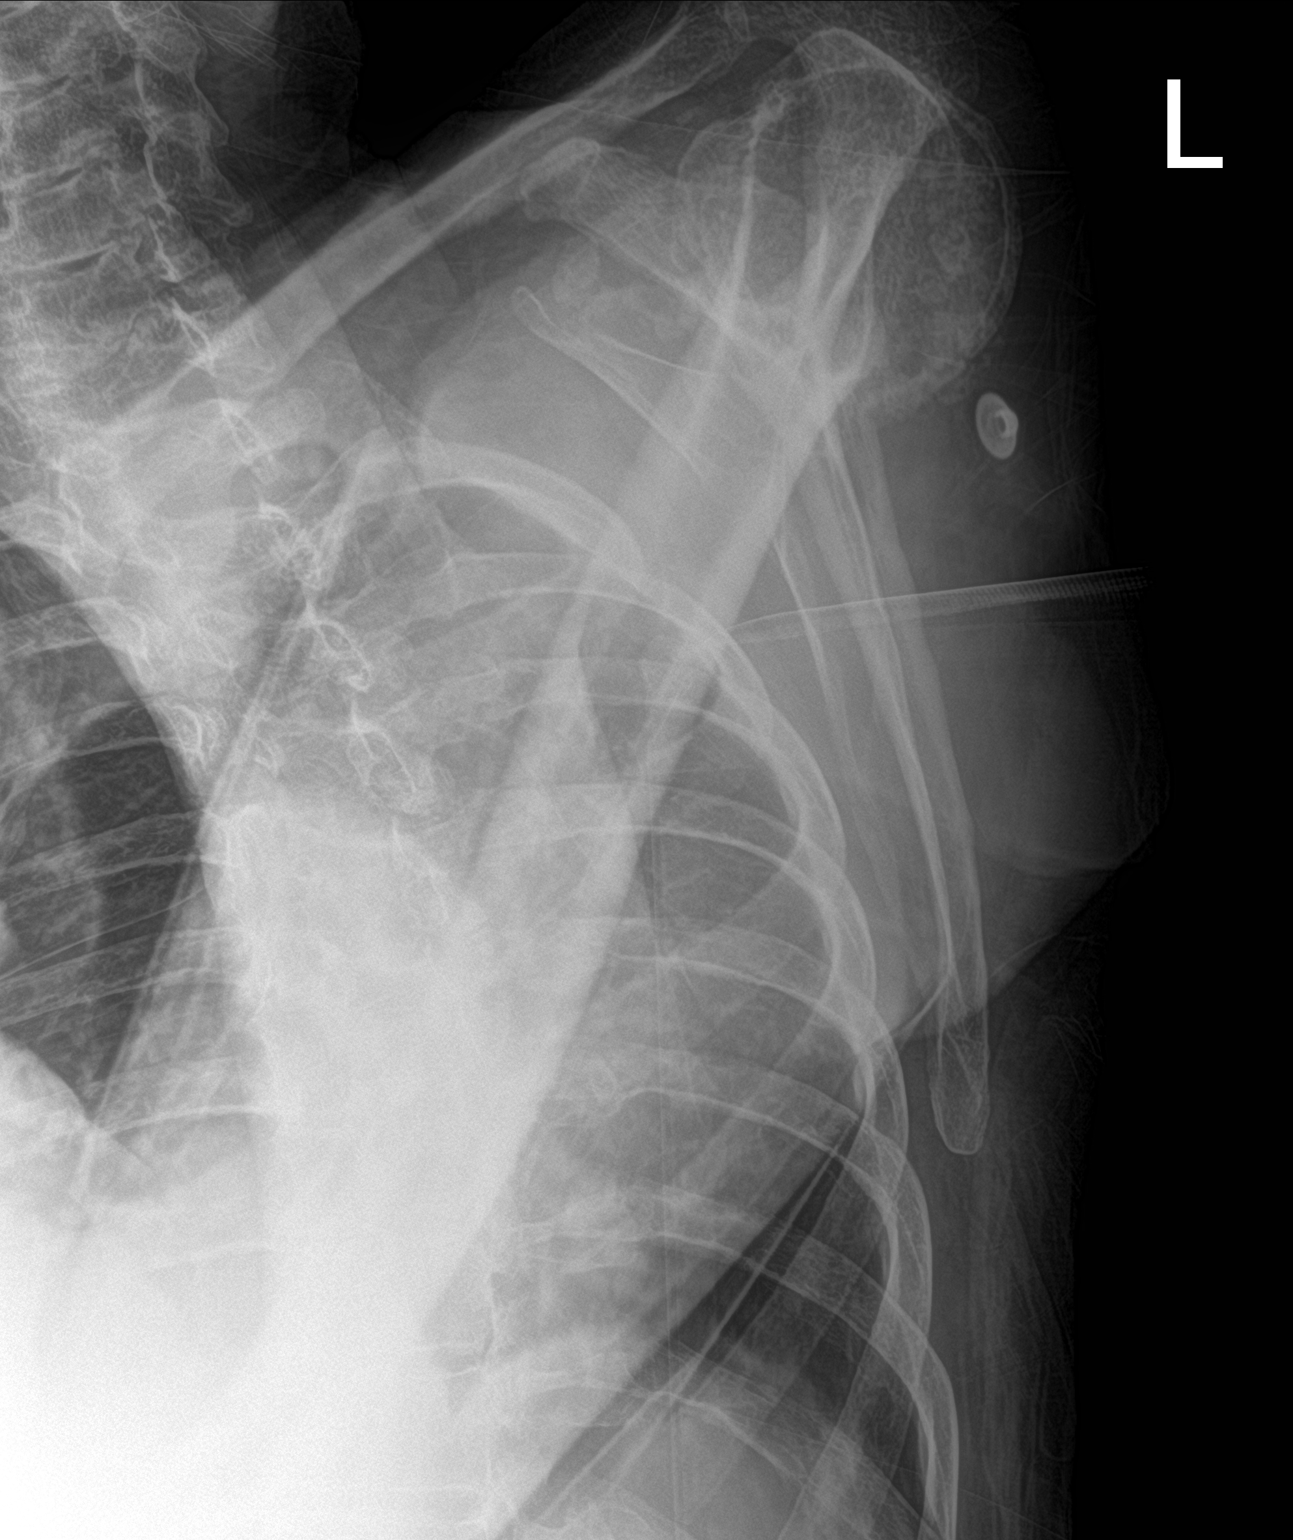

[2 of 2 positions shown; findings below may reference images not displayed]

FINDINGS: There is no evidence of fracture or dislocation. Moderate
degenerative changes seen involving the left glenohumeral and
acromioclavicular joints. Soft tissues are unremarkable.
IMPRESSION: Moderate degenerative joint disease is seen involving the left
glenohumeral and acromioclavicular joints. No acute abnormality is
noted.

## 2020-12-29 IMAGING — CR DG HAND 2V*L*
2 series · 2 of 2 positions shown · non-contrast
Comparison: None.

CLINICAL DATA: Left hand pain after fall.

EXAM:
LEFT HAND - 2 VIEW

[hand pa]
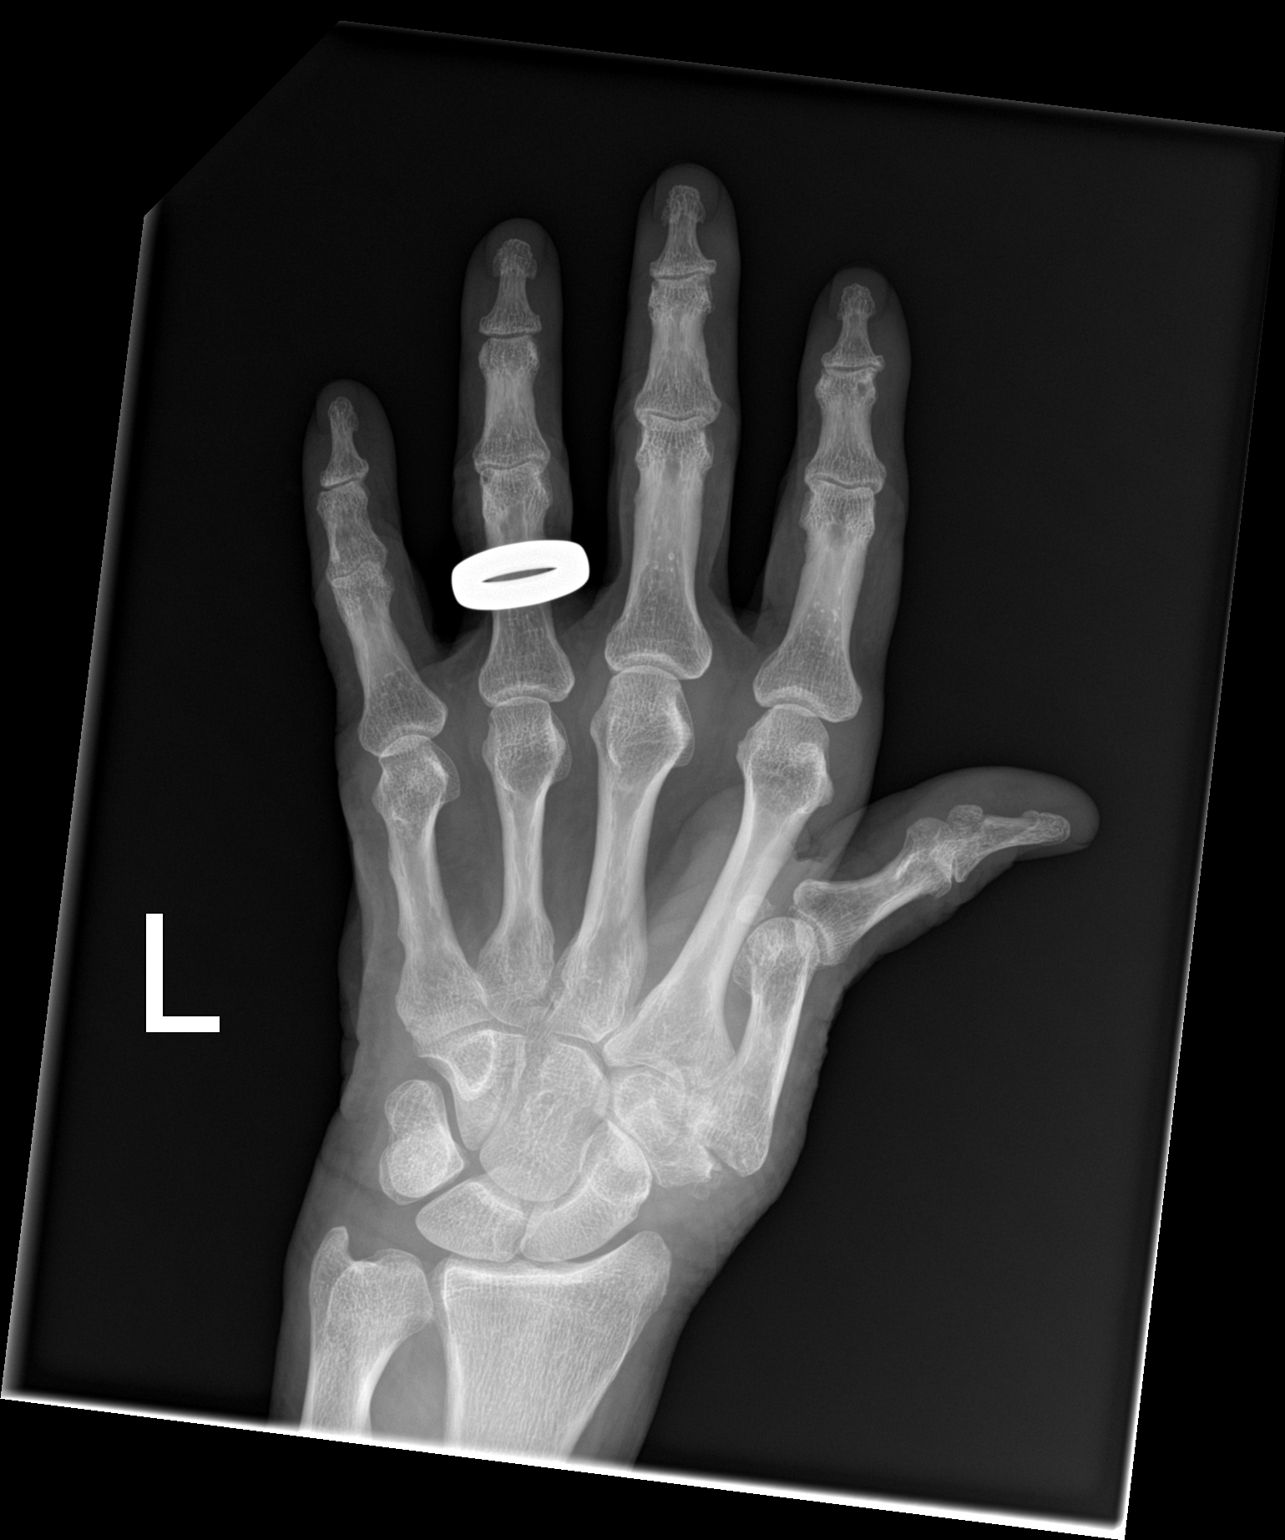

[hand lat]
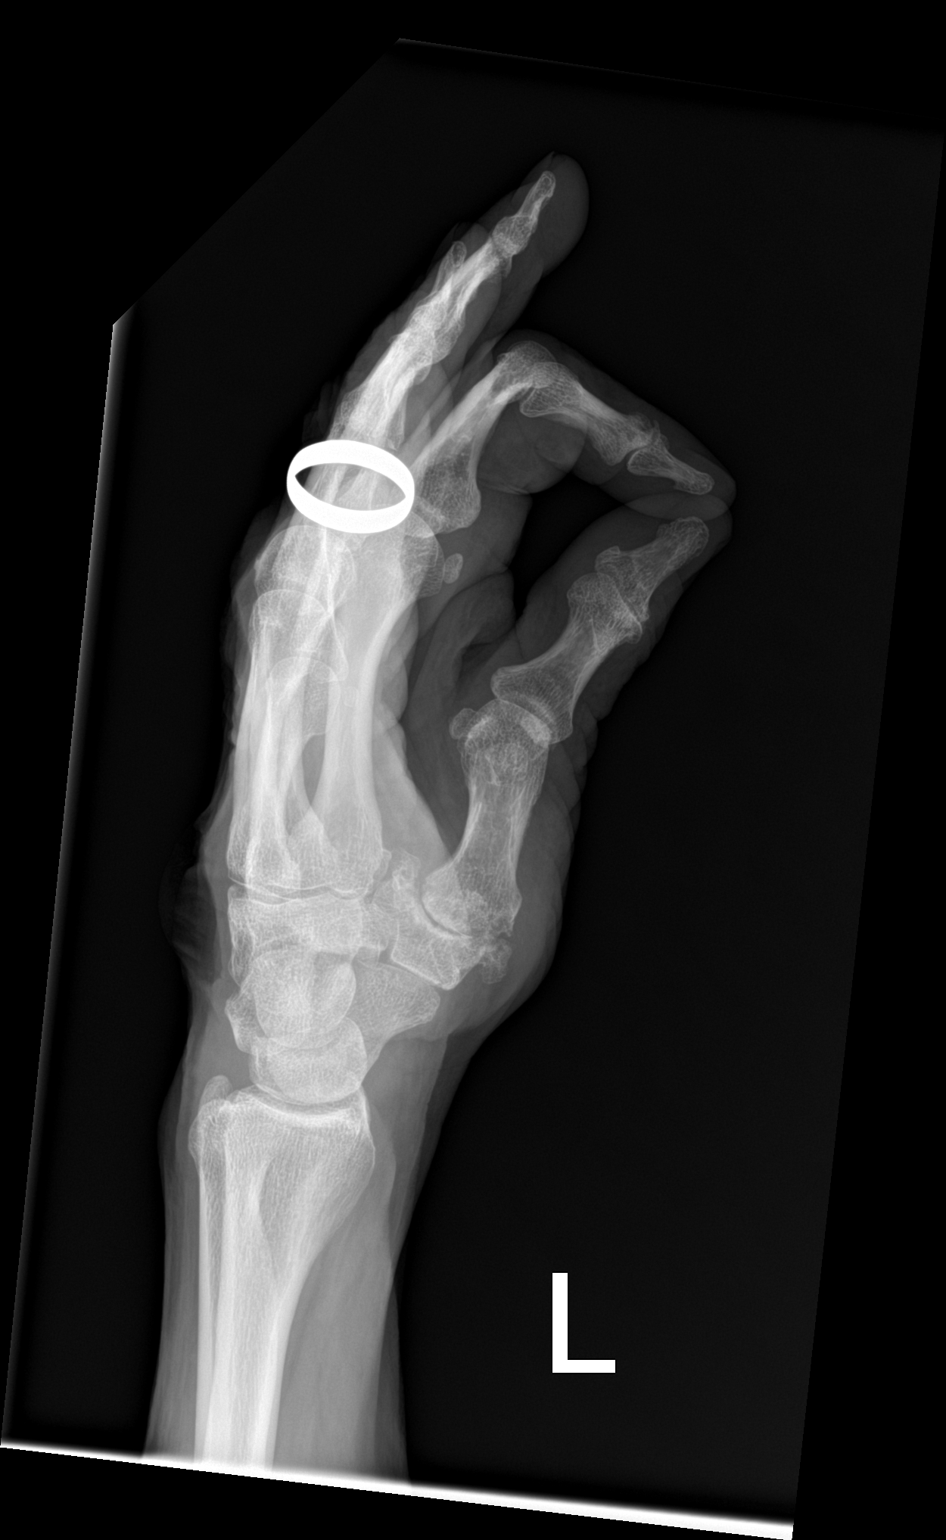

[2 of 2 positions shown; findings below may reference images not displayed]

FINDINGS: There is no evidence of fracture or dislocation. Severe degenerative
changes seen involving the first carpometacarpal joint. Soft tissues
are unremarkable.
IMPRESSION: Severe osteoarthritis involving the first carpometacarpal joint. No
acute abnormality seen.

## 2020-12-29 MED ORDER — FENTANYL CITRATE PF 50 MCG/ML IJ SOSY
25.0000 ug | PREFILLED_SYRINGE | INTRAMUSCULAR | Status: DC | PRN
  Administered 2020-12-30: 25 ug via INTRAVENOUS
  Filled 2020-12-29: qty 1

## 2020-12-29 MED ORDER — FENTANYL CITRATE PF 50 MCG/ML IJ SOSY
50.0000 ug | PREFILLED_SYRINGE | Freq: Once | INTRAMUSCULAR | Status: AC
Start: 2020-12-29 — End: 2020-12-29
  Administered 2020-12-29: 50 ug via INTRAVENOUS
  Filled 2020-12-29: qty 1

## 2020-12-29 MED ORDER — AMITRIPTYLINE HCL 10 MG PO TABS
10.0000 mg | ORAL_TABLET | Freq: Every day | ORAL | Status: DC
  Administered 2020-12-29 – 2021-01-03 (×6): 10 mg via ORAL
  Filled 2020-12-29 (×7): qty 1

## 2020-12-29 MED ORDER — METHOCARBAMOL 1000 MG/10ML IJ SOLN
500.0000 mg | Freq: Four times a day (QID) | INTRAVENOUS | Status: DC | PRN
  Filled 2020-12-29 (×2): qty 5

## 2020-12-29 MED ORDER — DOCUSATE SODIUM 100 MG PO CAPS
100.0000 mg | ORAL_CAPSULE | Freq: Two times a day (BID) | ORAL | Status: DC
  Administered 2020-12-29 – 2021-01-08 (×19): 100 mg via ORAL
  Filled 2020-12-29 (×20): qty 1

## 2020-12-29 MED ORDER — INSULIN ASPART 100 UNIT/ML IJ SOLN
0.0000 [IU] | Freq: Every day | INTRAMUSCULAR | Status: DC
  Administered 2020-12-30: 3 [IU] via SUBCUTANEOUS
  Administered 2021-01-01 – 2021-01-03 (×2): 2 [IU] via SUBCUTANEOUS
  Administered 2021-01-05: 3 [IU] via SUBCUTANEOUS
  Administered 2021-01-06: 2 [IU] via SUBCUTANEOUS
  Administered 2021-01-07: 4 [IU] via SUBCUTANEOUS

## 2020-12-29 MED ORDER — HYDROCODONE-ACETAMINOPHEN 5-325 MG PO TABS
1.0000 | ORAL_TABLET | Freq: Four times a day (QID) | ORAL | Status: DC | PRN
  Administered 2020-12-29 – 2020-12-30 (×5): 1 via ORAL
  Administered 2020-12-31 (×2): 2 via ORAL
  Administered 2021-01-01: 1 via ORAL
  Administered 2021-01-01 – 2021-01-02 (×4): 2 via ORAL
  Filled 2020-12-29 (×3): qty 1
  Filled 2020-12-29: qty 2
  Filled 2020-12-29 (×2): qty 1
  Filled 2020-12-29 (×5): qty 2
  Filled 2020-12-29: qty 1

## 2020-12-29 MED ORDER — INSULIN GLARGINE-YFGN 100 UNIT/ML ~~LOC~~ SOLN
8.0000 [IU] | Freq: Two times a day (BID) | SUBCUTANEOUS | Status: DC
  Administered 2020-12-29 – 2021-01-03 (×9): 8 [IU] via SUBCUTANEOUS
  Filled 2020-12-29 (×11): qty 0.08

## 2020-12-29 MED ORDER — POLYETHYLENE GLYCOL 3350 17 G PO PACK: 17.0000 g | PACK | Freq: Every day | ORAL | Status: DC | PRN

## 2020-12-29 MED ORDER — MELATONIN 5 MG PO TABS
5.0000 mg | ORAL_TABLET | Freq: Every day | ORAL | Status: DC
  Administered 2020-12-29 – 2021-01-08 (×11): 5 mg via ORAL
  Filled 2020-12-29 (×11): qty 1

## 2020-12-29 MED ORDER — METOPROLOL TARTRATE 12.5 MG HALF TABLET
12.5000 mg | ORAL_TABLET | Freq: Two times a day (BID) | ORAL | Status: DC
  Administered 2020-12-29 – 2021-01-08 (×21): 12.5 mg via ORAL
  Filled 2020-12-29 (×21): qty 1

## 2020-12-29 MED ORDER — BISACODYL 5 MG PO TBEC: 5.0000 mg | DELAYED_RELEASE_TABLET | Freq: Every day | ORAL | Status: DC | PRN

## 2020-12-29 MED ORDER — AMIODARONE HCL 200 MG PO TABS
200.0000 mg | ORAL_TABLET | Freq: Every day | ORAL | Status: DC
  Administered 2020-12-31 – 2021-01-08 (×9): 200 mg via ORAL
  Filled 2020-12-29 (×9): qty 1

## 2020-12-29 MED ORDER — DULOXETINE HCL 30 MG PO CPEP: 30.0000 mg | ORAL_CAPSULE | Freq: Every day | ORAL | Status: DC

## 2020-12-29 MED ORDER — GABAPENTIN 300 MG PO CAPS
300.0000 mg | ORAL_CAPSULE | Freq: Every day | ORAL | Status: DC
  Administered 2020-12-29 – 2021-01-08 (×11): 300 mg via ORAL
  Filled 2020-12-29 (×11): qty 1

## 2020-12-29 MED ORDER — METHOCARBAMOL 500 MG PO TABS
500.0000 mg | ORAL_TABLET | Freq: Four times a day (QID) | ORAL | Status: DC | PRN
  Administered 2020-12-30 – 2021-01-07 (×18): 500 mg via ORAL
  Filled 2020-12-29 (×18): qty 1

## 2020-12-29 MED ORDER — INSULIN GLARGINE 100 UNIT/ML SOLOSTAR PEN: 8.0000 [IU] | PEN_INJECTOR | Freq: Two times a day (BID) | SUBCUTANEOUS | Status: DC

## 2020-12-29 MED ORDER — INSULIN ASPART 100 UNIT/ML IJ SOLN
0.0000 [IU] | Freq: Three times a day (TID) | INTRAMUSCULAR | Status: DC
  Administered 2020-12-29: 3 [IU] via SUBCUTANEOUS
  Administered 2020-12-30: 15 [IU] via SUBCUTANEOUS
  Administered 2020-12-30: 2 [IU] via SUBCUTANEOUS
  Administered 2020-12-31: 5 [IU] via SUBCUTANEOUS
  Administered 2020-12-31: 8 [IU] via SUBCUTANEOUS
  Administered 2020-12-31: 3 [IU] via SUBCUTANEOUS
  Administered 2021-01-01: 2 [IU] via SUBCUTANEOUS
  Administered 2021-01-01 – 2021-01-02 (×3): 5 [IU] via SUBCUTANEOUS
  Administered 2021-01-02 – 2021-01-03 (×2): 8 [IU] via SUBCUTANEOUS
  Administered 2021-01-03 – 2021-01-04 (×3): 5 [IU] via SUBCUTANEOUS
  Administered 2021-01-04 (×2): 2 [IU] via SUBCUTANEOUS
  Administered 2021-01-05 (×2): 8 [IU] via SUBCUTANEOUS
  Administered 2021-01-06: 5 [IU] via SUBCUTANEOUS
  Administered 2021-01-06: 3 [IU] via SUBCUTANEOUS
  Administered 2021-01-06: 5 [IU] via SUBCUTANEOUS
  Administered 2021-01-07: 8 [IU] via SUBCUTANEOUS
  Administered 2021-01-08 (×3): 5 [IU] via SUBCUTANEOUS

## 2020-12-29 NOTE — ED Provider Notes (Signed)
[facility] EMERGENCY DEPARTMENT Provider Note   CSN: [id number] Arrival date & time: 12/29/20  1245     History Chief Complaint  Patient presents with   Hip Pain    WACO FOERSTER is a 85 y.o. male with a hx of hypertension, T2DM, RLS, parkinsonism, OSA, AAA, and PAF no longer on anticoagulation who presents to the ED with complaints of left hip pain S/p fall a couple of days ago. Patient states he was walking back from the bathroom to get into bed when he slipped and fell. States he did not hit his head or have LOC. He scraped his elbow. He is having pain to the left hip, worse with movement of any kind, was told he had some type of fx, tried to follow up with orthopedics, he is not sure what happened, but ultimately ended up in the ED and stated his wife could give [place] more information. Denies chest pain, dyspnea, abdominal pain, headache, or neck pain. Reports he has chronic left shoulder pain. Per patient's wife fall occurred Monday.   HPI     Past Medical History:  Diagnosis Date   Cellulitis and abscess of leg, except foot    Hypertension    Obstructive sleep apnea (adult) (pediatric)    Pain in joint, pelvic region and thigh    Pneumonia, organism unspecified(486)    Restless legs syndrome (RLS)    RLS (restless legs syndrome) 09/01/2012   Thoracic or lumbosacral neuritis or radiculitis, unspecified    Type II or unspecified type diabetes mellitus without mention of complication, not stated as uncontrolled    Unspecified disease of pericardium    Unspecified hereditary and idiopathic peripheral neuropathy     Patient Active Problem List   Diagnosis Date Noted   Malnutrition of moderate degree 10/27/2020   UTI (urinary tract infection) 10/25/2020   Sepsis ([place]) 10/12/2020   AMS (altered mental status)    PAF (paroxysmal atrial fibrillation) ([place])    Acute metabolic encephalopathy 30/12/2328   Anxiety 09/09/2020   SIRS (systemic inflammatory response  syndrome) ([place]) 09/09/2020   Diaphoresis    Slow transit constipation 08/07/2020   Abdominal aortic aneurysm without rupture ([place]) 03/17/2020   Abnormal gait 03/17/2020   Ascending aorta dilatation ([place]) 03/17/2020   Diabetic renal disease ([place]) 03/17/2020   Recurrent UTI 07/16/2019   Hypertensive urgency 04/26/2019   Malignant HTN with heart disease, w/o CHF, w/o chronic kidney disease 04/26/2019   Elevated troponin    LFTs abnormal    Statin intolerance 01/15/2019   Penis pain 04/10/2018   Urethra cancer ([place]) 02/25/2018   Lower extremity edema 06/09/2017   Peptic ulcer disease 05/16/2017   GERD (gastroesophageal reflux disease) 05/16/2017   Chronic kidney disease, stage 3a ([place]) 04/11/2017   Lower urinary tract symptoms (LUTS) 04/15/2016   Narcotic dependence ([place]) 08/31/2015   Imbalance 08/31/2015   Diabetic peripheral neuropathy ([place]) 08/31/2015   Other malaise and fatigue 05/02/2015   Chronic fatigue 05/02/2015   Panic disorder without agoraphobia 03/22/2015   Hyperlipidemia 02/03/2014   Thrombocytopenia ([place]) 08/18/2013   Parkinsonism ([place]) 08/18/2013   Other disorders of lung 08/18/2013   Organic impotence 08/18/2013   Memory loss 08/18/2013   Low back pain 08/18/2013   Iron deficiency anemia 08/18/2013   Hypercalcemia 08/18/2013   Cellulitis of right leg 08/18/2013   Atherosclerotic heart disease of native coronary artery without angina pectoris 08/18/2013   RLS (restless legs syndrome) 09/01/2012   HERPES SIMPLEX INFECTION 11/06/2006  Type II diabetes mellitus ([place]) 11/06/2006   HYPOGONADISM 11/06/2006   VITAMIN D DEFICIENCY 11/06/2006   ANXIETY 11/06/2006   OBSTRUCTIVE SLEEP APNEA 11/06/2006   RESTLESS LEG SYNDROME 11/06/2006   Essential hypertension 11/06/2006   BENIGN PROSTATIC HYPERTROPHY 11/06/2006   ROSACEA 11/06/2006   OSTEOARTHRITIS 11/06/2006   INSOMNIA 11/06/2006   HYPERGLYCEMIA 11/06/2006   COLONIC POLYPS, HX OF 11/06/2006    Past Surgical  History:  Procedure Laterality Date   ANAL FISSURE REPAIR     pericarditis  2009       Family History  Problem Relation Age of Onset   Diabetes Father     Social History   Tobacco Use   Smoking status: Former    Types: Cigarettes    Quit date: 09/01/1980    Years since quitting: 40.3   Smokeless tobacco: Never  Vaping Use   Vaping Use: Never used  Substance Use Topics   Alcohol use: No   Drug use: No    Home Medications Prior to Admission medications   Medication Sig Start Date End Date Taking? Authorizing Provider  acetaminophen (TYLENOL) 500 MG tablet Take 500 mg by mouth 3 (three) times daily.    [provider]  amiodarone (PACERONE) 200 MG tablet Take 1 tablet (200 mg total) by mouth daily. 09/26/20   Mercy Riding, MD  amitriptyline (ELAVIL) 10 MG tablet Take 1 tablet (10 mg total) by mouth at bedtime. 10/29/20   Nita Sells, MD  apixaban (ELIQUIS) 5 MG TABS tablet Take 1 tablet (5 mg total) by mouth 2 (two) times daily. 09/29/20 10/29/20  Mercy Riding, MD  DULoxetine (CYMBALTA) 30 MG capsule Take 1 capsule (30 mg total) by mouth daily. 10/29/20   Nita Sells, MD  ferrous sulfate 325 (65 FE) MG EC tablet Take 325 mg by mouth daily.  05/28/18   [provider]  gabapentin (NEURONTIN) 300 MG capsule Take 1 capsule (300 mg total) by mouth at bedtime. 09/25/20   Mercy Riding, MD  LANTUS SOLOSTAR 100 UNIT/ML Solostar Pen Inject 8 Units into the skin 2 (two) times daily. 09/25/20   Mercy Riding, MD  melatonin 5 MG TABS Take 5 mg by mouth at bedtime.    [provider]  metFORMIN (GLUCOPHAGE-XR) 500 MG 24 hr tablet Take 500 mg by mouth daily with breakfast. 01/17/20   [provider]  metoprolol tartrate (LOPRESSOR) 25 MG tablet Take 0.5 tablets (12.5 mg total) by mouth 2 (two) times daily. 09/25/20   Mercy Riding, MD  [facility] powder Apply 1 application topically in the morning and at bedtime. To rash on back and groin and sacrum  10/18/20   [provider]  polyethylene glycol powder (GLYCOLAX/MIRALAX) 17 GM/SCOOP powder Take 17 g by mouth daily.    [provider]  Rotigotine (NEUPRO) 8 MG/24HR PT24 Place 1 patch onto the skin every evening.    [provider]  Semaglutide,0.25 or 0.5MG /DOS, (OZEMPIC, 0.25 OR 0.5 MG/DOSE,) 2 MG/1.5ML SOPN Inject 0.5 mg into the skin every Tuesday.    [provider]  senna-docusate (SENOKOT-S) 8.6-50 MG tablet Take 1 tablet by mouth 2 (two) times daily as needed for moderate constipation. 09/25/20   Mercy Riding, MD  thiamine 100 MG tablet Take 1 tablet (100 mg total) by mouth daily. 09/26/20   Mercy Riding, MD  traMADol (ULTRAM) 50 MG tablet Take 1 tablet (50 mg total) by mouth daily. 10/29/20   Nita Sells, MD  Allergies    Oxycodone, Iodinated diagnostic agents, Iodine, Linezolid, Methadone hcl, Promethazine, Isovue [iopamidol], Morphine sulfate, Omnipaque [iohexol], Other, Oxycodone hcl, Quetiapine, Renografin [diatrizoate], Statins, Zolpidem, Atorvastatin, Carbidopa-levodopa, Chlorhexidine gluconate [chlorhexidine], Clindamycin, Colesevelam, Doxazosin, Lovastatin, and Rosuvastatin  Review of Systems   Review of Systems  Constitutional:  Negative for chills and fever.  Respiratory:  Negative for shortness of breath.   Cardiovascular:  Negative for chest pain.  Gastrointestinal:  Negative for abdominal pain and vomiting.  Musculoskeletal:  Positive for arthralgias. Negative for neck pain.  Skin:  Positive for wound.  Neurological:  Negative for syncope and headaches.  All other systems reviewed and are negative.  Physical Exam Updated Vital Signs BP (!) 155/72   Pulse 66   Temp 98.4 F (36.9 C) (Oral)   Resp 16   Ht 5\' 10"  (1.778 m)   Wt 68 kg   SpO2 99%   BMI 21.52 kg/m   Physical Exam Vitals and nursing note reviewed.  Constitutional:      General: He is not in acute distress.    Appearance: He is not ill-appearing or  toxic-appearing.  HENT:     Head: Normocephalic and atraumatic. No raccoon eyes or Battle's sign.     Right Ear: No hemotympanum.     Left Ear: No hemotympanum.     Nose: Nose normal.     Mouth/Throat:     Pharynx: Uvula midline.  Eyes:     Comments: PERRL. EOMI.   Cardiovascular:     Rate and Rhythm: Normal rate.     Pulses:          Radial pulses are 2+ on the right side and 2+ on the left side.       Dorsalis pedis pulses are 2+ on the right side and 2+ on the left side.  Pulmonary:     Effort: Pulmonary effort is normal.     Breath sounds: Normal breath sounds.  Chest:     Chest wall: No tenderness.  Abdominal:     General: There is no distension.     Palpations: Abdomen is soft.     Tenderness: There is no abdominal tenderness.  Musculoskeletal:     Cervical back: Normal range of motion. No spinous process tenderness.     Comments: Upper extremities: Abrasion to the posterior left elbow. Intact AROM throughout shoulders, elbow, wrist & digits. Tender to the MCP of the left 1st digit, over the wound on the left elbow, and to the diffuse left glenohumeral joint (patient reports chronic left shoulder problems) Back: No midline tenderness or step off.  Lower extremities. Holding LLE straight. Able to lift RLE off of the bed and full flex the knee, able to plantar/dorsiflex bilateral ankles. Tender to the left hip diffusely. Otherwise nontender.   Neurological:     Mental Status: He is alert.     Comments: Sensation grossly intact x 4. 5/5 strength with grip strength and plantar/dorsiflexion bilaterally.     ED Results / Procedures / Treatments   Labs (all labs ordered are listed, but only abnormal results are displayed) Labs Reviewed - No data to display  EKG None  Radiology DG Pelvis 1-2 Views  Result Date: 12/29/2020 CLINICAL DATA:  Left hip pain after fall. EXAM: PELVIS - 1-2 VIEW COMPARISON:  January 11, 2015. FINDINGS: There is no evidence of pelvic fracture or  diastasis. No pelvic bone lesions are seen. IMPRESSION: Negative. Electronically Signed   By: Bobbe Medico.D.  On: 12/29/2020 14:17   DG Elbow Complete Left  Result Date: 12/29/2020 CLINICAL DATA:  Left elbow pain after fall. EXAM: LEFT ELBOW - COMPLETE 3+ VIEW COMPARISON:  None. FINDINGS: There is no evidence of fracture, dislocation, or joint effusion. There is no evidence of arthropathy or other focal bone abnormality. Soft tissues are unremarkable. IMPRESSION: Negative. Electronically Signed   By: Marijo Conception M.D.   On: 12/29/2020 14:15   DG Hand 2 View Left  Result Date: 12/29/2020 CLINICAL DATA:  Left hand pain after fall. EXAM: LEFT HAND - 2 VIEW COMPARISON:  None. FINDINGS: There is no evidence of fracture or dislocation. Severe degenerative changes seen involving the first carpometacarpal joint. Soft tissues are unremarkable. IMPRESSION: Severe osteoarthritis involving the first carpometacarpal joint. No acute abnormality seen. Electronically Signed   By: Marijo Conception M.D.   On: 12/29/2020 14:13   DG Shoulder Left  Result Date: 12/29/2020 CLINICAL DATA:  Left shoulder pain after fall. EXAM: LEFT SHOULDER - 2+ VIEW COMPARISON:  None. FINDINGS: There is no evidence of fracture or dislocation. Moderate degenerative changes seen involving the left glenohumeral and acromioclavicular joints. Soft tissues are unremarkable. IMPRESSION: Moderate degenerative joint disease is seen involving the left glenohumeral and acromioclavicular joints. No acute abnormality is noted. Electronically Signed   By: Marijo Conception M.D.   On: 12/29/2020 14:16   DG Femur Min 2 Views Left  Result Date: 12/29/2020 CLINICAL DATA:  LEFT hip pain after fall EXAM: LEFT FEMUR 2 VIEWS COMPARISON:  None. FINDINGS: There is varus angulation of the LEFT femoral head in relation to the femoral neck. There is a cortical step-off along the inferior medial margin of the femoral neck. No fracture of the femoral shaft. The  knee joint appears normal on two views. IMPRESSION: Concern for LEFT femoral neck fracture with varus angulation. Consider dedicated exam of the LEFT hip/pelvis or CT LEFT hip. Electronically Signed   By: Suzy Bouchard M.D.   On: 12/29/2020 14:12    Procedures Procedures   Medications Ordered in ED Medications - No data to display  ED Course  I have reviewed the triage vital signs and the nursing notes.  Pertinent labs & imaging results that were available during my care of the patient were reviewed by me and considered in my medical decision making (see chart for details).    MDM Rules/Calculators/A&P                           Patient presents to the ED with complaints of left hip pain S/p fall. Nontoxic, vitals without significant abnormality. Imaging ordered in areas of discomfort on exam.   Additional history obtained:  Additional history obtained from chart review & nursing note review.  X-ray read with the patient IMPRESSION: Acute nondisplaced transcervical fracture of the left femur- unable to view images though.   Imaging Studies ordered:  I ordered imaging studies which included X-rays of the left shoulder, elbow, hand, femur, and the pelvis, I independently reviewed, formal radiology impression shows:  Left shoulder: Moderate degenerative joint disease is seen involving the left glenohumeral and acromioclavicular joints. No acute abnormality is noted. Left elbow: Negative.  Left hand: Severe osteoarthritis involving the first carpometacarpal joint. No acute abnormality seen.  Left femur: Concern for LEFT femoral neck fracture with varus angulation. Consider dedicated exam of the LEFT hip/pelvis or CT LEFT hip Pelvis: Negative.   ED Course:  Suspect fx, plan for  CT left hip for further assessment. Basic labs ordered as well.   14:25: CONSULT: Discussed with Hilbert Odor PA-C with orthopedics, aware of patient, CT ordered.   Patient care signed out to oncoming ED  team @ change of shift pending work-up and disposition.   Portions of this note were generated with [employer]. Dictation errors may occur despite best attempts at proofreading.  Final Clinical Impression(s) / ED Diagnoses Final diagnoses:  Fall    Rx / DC Orders ED Discharge Orders     None        Amaryllis Dyke, PA-C 12/29/20 1521    Lorelle Gibbs, DO 12/29/20 1529

## 2020-12-29 NOTE — ED Triage Notes (Signed)
Pt arrives via EMS from [facility]. Pt has known femur fracture and had a follow up appointment with ortho that fell through so wife wanted him to be seen in the ER so he could be seen quicker

## 2020-12-29 NOTE — Consult Note (Signed)
Reason for Consult:Left hip fx Referring Physician: Lavenia Atlas Time called: 1422 Time at bedside: Jason Hartman is an 85 y.o. male.  HPI: Jason Hartman was at the SNF where he is currently doing rehab for deconditioning after a severe UTI about 3 months ago. He tried to transfer from chair to bed by himself and fell. He had immediate left hip pain and could not get up. X-rays were done at the facility and were read as negative 2d ago. Overread by radiology suggested there may be an occult fx and he was sent to ED for further evaluation. Workup here shows a femoral neck fx and orthopedic surgery was consulted.  Past Medical History:  Diagnosis Date   Cellulitis and abscess of leg, except foot    Hypertension    Obstructive sleep apnea (adult) (pediatric)    Pain in joint, pelvic region and thigh    Pneumonia, organism unspecified(486)    Restless legs syndrome (RLS)    RLS (restless legs syndrome) 09/01/2012   Thoracic or lumbosacral neuritis or radiculitis, unspecified    Type II or unspecified type diabetes mellitus without mention of complication, not stated as uncontrolled    Unspecified disease of pericardium    Unspecified hereditary and idiopathic peripheral neuropathy     Past Surgical History:  Procedure Laterality Date   ANAL FISSURE REPAIR     pericarditis  2009    Family History  Problem Relation Age of Onset   Diabetes Father     Social History:  reports that he quit smoking about 40 years ago. He has never used smokeless tobacco. He reports that he does not drink alcohol and does not use drugs.  Allergies:  Allergies  Allergen Reactions   Oxycodone Anaphylaxis    Other reaction(s): Other (See Comments) Cannot recall specifics   Iodinated Diagnostic Agents Hives    alleric to renografin,isovue & omnipaque, hives, requires 13 hr prep//a.calhoun, Onset Date: [id number]      Iodine Hives and Rash    alleric to renografin,isovue & omnipaque, hives, requires  13 hr prep//a.calhoun, Onset Date: [id number] allergic to renografin,isovue & omnipaque, hives, requires 13 hr prep//a.calhoun, Onset Date: [id number]   Linezolid Hives and Rash        Methadone Hcl Other (See Comments)    HALLUCINATIONS    Promethazine Other (See Comments)    Mental status change, DELIRIUM   Other reaction(s): erratic behavior Other reaction(s): Other (See Comments) Mental status change DELIRIUM (pulled out IV)   Isovue [Iopamidol]    Morphine Sulfate Other (See Comments)    Pt not sure about allergy    Omnipaque [Iohexol] Hives    Code: HIVES, Desc: alleric to renografin,isovue & omnipaque, hives, requires 13 hr prep//a.calhoun, Onset Date: [id number]    Other Hives    Other reaction(s): erratic behanvior Other reaction(s): erratic behavior   Oxycodone Hcl Other (See Comments)    Pt not sure about allergy    Quetiapine Other (See Comments)    Pt not sure about allergy  Other reaction(s): Other (See Comments) Pt not sure about allergy  Pt not sure about allergy    Renografin [Diatrizoate]    Statins     Other reaction(s): muscle weakness Other reaction(s): muscle weakness   Zolpidem Other (See Comments)    Other reaction(s): erratic behanvior Other reaction(s): Delusions (intolerance) Altered mental status   Atorvastatin Itching and Other (See Comments)    Tired, weakness    Carbidopa-Levodopa Anxiety   Chlorhexidine Gluconate [Chlorhexidine]  Hives and Rash   Clindamycin Rash   Colesevelam Other (See Comments)    tired    Doxazosin Rash   Lovastatin Other (See Comments)    Tired, nervousness    Rosuvastatin Other (See Comments)    Tired, weakness     Medications: I have reviewed the patient's current medications.  No results found for this or any previous visit (from the past 48 hour(s)).  DG Pelvis 1-2 Views  Result Date: 12/29/2020 CLINICAL DATA:  Left hip pain after fall. EXAM: PELVIS - 1-2 VIEW COMPARISON:  January 11, 2015. FINDINGS:  There is no evidence of pelvic fracture or diastasis. No pelvic bone lesions are seen. IMPRESSION: Negative. Electronically Signed   By: Marijo Conception M.D.   On: 12/29/2020 14:17   DG Elbow Complete Left  Result Date: 12/29/2020 CLINICAL DATA:  Left elbow pain after fall. EXAM: LEFT ELBOW - COMPLETE 3+ VIEW COMPARISON:  None. FINDINGS: There is no evidence of fracture, dislocation, or joint effusion. There is no evidence of arthropathy or other focal bone abnormality. Soft tissues are unremarkable. IMPRESSION: Negative. Electronically Signed   By: Marijo Conception M.D.   On: 12/29/2020 14:15   DG Hand 2 View Left  Result Date: 12/29/2020 CLINICAL DATA:  Left hand pain after fall. EXAM: LEFT HAND - 2 VIEW COMPARISON:  None. FINDINGS: There is no evidence of fracture or dislocation. Severe degenerative changes seen involving the first carpometacarpal joint. Soft tissues are unremarkable. IMPRESSION: Severe osteoarthritis involving the first carpometacarpal joint. No acute abnormality seen. Electronically Signed   By: Marijo Conception M.D.   On: 12/29/2020 14:13   DG Shoulder Left  Result Date: 12/29/2020 CLINICAL DATA:  Left shoulder pain after fall. EXAM: LEFT SHOULDER - 2+ VIEW COMPARISON:  None. FINDINGS: There is no evidence of fracture or dislocation. Moderate degenerative changes seen involving the left glenohumeral and acromioclavicular joints. Soft tissues are unremarkable. IMPRESSION: Moderate degenerative joint disease is seen involving the left glenohumeral and acromioclavicular joints. No acute abnormality is noted. Electronically Signed   By: Marijo Conception M.D.   On: 12/29/2020 14:16   DG Femur Min 2 Views Left  Result Date: 12/29/2020 CLINICAL DATA:  LEFT hip pain after fall EXAM: LEFT FEMUR 2 VIEWS COMPARISON:  None. FINDINGS: There is varus angulation of the LEFT femoral head in relation to the femoral neck. There is a cortical step-off along the inferior medial margin of the femoral  neck. No fracture of the femoral shaft. The knee joint appears normal on two views. IMPRESSION: Concern for LEFT femoral neck fracture with varus angulation. Consider dedicated exam of the LEFT hip/pelvis or CT LEFT hip. Electronically Signed   By: Suzy Bouchard M.D.   On: 12/29/2020 14:12    Review of Systems  HENT:  Negative for ear discharge, ear pain, hearing loss and tinnitus.   Eyes:  Negative for photophobia and pain.  Respiratory:  Negative for cough and shortness of breath.   Cardiovascular:  Negative for chest pain.  Gastrointestinal:  Negative for abdominal pain, nausea and vomiting.  Genitourinary:  Negative for dysuria, flank pain, frequency and urgency.  Musculoskeletal:  Positive for arthralgias (Left hip). Negative for back pain, myalgias and neck pain.  Neurological:  Negative for dizziness and headaches.  Hematological:  Does not bruise/bleed easily.  Psychiatric/Behavioral:  The patient is not nervous/anxious.   Blood pressure (!) 143/74, pulse 64, temperature 98.4 F (36.9 C), temperature source Oral, resp. rate 16, height 5'  10" (1.778 m), weight 68 kg, SpO2 100 %. Physical Exam Constitutional:      General: He is not in acute distress.    Appearance: He is well-developed. He is not diaphoretic.  HENT:     Head: Normocephalic and atraumatic.  Eyes:     General: No scleral icterus.       Right eye: No discharge.        Left eye: No discharge.     Conjunctiva/sclera: Conjunctivae normal.  Cardiovascular:     Rate and Rhythm: Normal rate and regular rhythm.  Pulmonary:     Effort: Pulmonary effort is normal. No respiratory distress.  Musculoskeletal:     Cervical back: Normal range of motion.     Comments: LLE No traumatic wounds, ecchymosis, or rash  Minimal TTP  No knee or ankle effusion  Knee stable to varus/ valgus and anterior/posterior stress  Sens DPN, SPN, TN intact  Motor EHL, ext, flex, evers 5/5  DP 2+, PT 2+, No significant edema  Skin:     General: Skin is warm and dry.  Neurological:     Mental Status: He is alert.  Psychiatric:        Mood and Affect: Mood normal.        Behavior: Behavior normal.    Assessment/Plan: Left hip fx -- Plan hip hemi tomorrow by Dr. Sammuel Hines. Please keep NPO after MN.    Lisette Abu, PA-C Orthopedic Surgery [phone] 12/29/2020, 3:21 PM

## 2020-12-29 NOTE — H&P (Signed)
History and Physical    Jason Hartman Jason Hartman DOB: [birth year] DOA: 12/29/2020  PCP: Jason Manes, MD Consultants:  [facility] - urology; [facility] - GI Patient coming from: [facility]; NOK: Wife, Jason Hartman, [phone]  Chief Complaint: Fall  HPI: Jason Hartman is a 85 y.o. male with medical history significant of HTN; OSA; RLS; and DM presenting with fall.  He is in [facility] for deconditioning after a severe UTI.  He was attempting to transfer from wheelchair to bed without assistance when he fell and landed on his hip.  This happened 2-3 days ago and he thought it would get better but when he still couldn't bear weight he decided to come in.     ED Course: Hip fracture, ortho to fix tomorrow.  Review of Systems: As per HPI; otherwise review of systems reviewed and negative.    COVID Vaccine Status:  Complete plus booster  Past Medical History:  Diagnosis Date   CAD (coronary artery disease)    s/p stents   Cellulitis and abscess of leg, except foot    Hypertension    Obstructive sleep apnea (adult) (pediatric)    Restless legs syndrome (RLS)    Thoracic or lumbosacral neuritis or radiculitis, unspecified    Type II or unspecified type diabetes mellitus without mention of complication, not stated as uncontrolled    Unspecified disease of pericardium    Unspecified hereditary and idiopathic peripheral neuropathy     Past Surgical History:  Procedure Laterality Date   ANAL FISSURE REPAIR     pericarditis  2009    Social History   Socioeconomic History   Marital status: Married    Spouse name: Jason Hartman   Number of children: 2   Years of education: College   Highest education level: Not on file  Occupational History   Occupation: Marine scientist: [employer]  Tobacco Use   Smoking status: Former    Types: Cigarettes    Quit date: 09/01/1980    Years since quitting: 40.3   Smokeless tobacco: Never  Vaping Use   Vaping Use: Never used   Substance and Sexual Activity   Alcohol use: Yes    Comment: rare   Drug use: Never   Sexual activity: Not on file  Other Topics Concern   Not on file  Social History Narrative   Patient lives at home with spouse.   Caffeine Use: occasionally   Social Determinants of Health   Financial Resource Strain: Not on file  Food Insecurity: Not on file  Transportation Needs: Not on file  Physical Activity: Not on file  Stress: Not on file  Social Connections: Not on file  Intimate Partner Violence: Not on file    Allergies  Allergen Reactions   Oxycodone Anaphylaxis    Other reaction(s): Other (See Comments) Cannot recall specifics   Iodinated Diagnostic Agents Hives    alleric to renografin,isovue & omnipaque, hives, requires 13 hr prep//a.[email or web address], Onset Date: [id number]      Iodine Hives and Rash    alleric to renografin,isovue & omnipaque, hives, requires 13 hr prep//a.[email or web address], Onset Date: [id number] allergic to renografin,isovue & omnipaque, hives, requires 13 hr prep//a.[email or web address], Onset Date: [id number]   Linezolid Hives and Rash        Methadone Hcl Other (See Comments)    HALLUCINATIONS    Promethazine Other (See Comments)    Mental status change, DELIRIUM   Other reaction(s): erratic behavior Other reaction(s): Other (See Comments) Mental status change DELIRIUM (  pulled out IV)   Isovue [Iopamidol]    Morphine Sulfate Other (See Comments)    Pt not sure about allergy    Omnipaque [Iohexol] Hives    Code: HIVES, Desc: alleric to renografin,isovue & omnipaque, hives, requires 13 hr prep//a.[email or web address], Onset Date: 92330076    Other Hives    Other reaction(s): erratic behanvior Other reaction(s): erratic behavior   Oxycodone Hcl Other (See Comments)    Pt not sure about allergy    Quetiapine Other (See Comments)    Pt not sure about allergy  Other reaction(s): Other (See Comments) Pt not sure about allergy  Pt not sure about allergy    Renografin [Diatrizoate]     Statins     Other reaction(s): muscle weakness Other reaction(s): muscle weakness   Zolpidem Other (See Comments)    Other reaction(s): erratic behanvior Other reaction(s): Delusions (intolerance) Altered mental status   Atorvastatin Itching and Other (See Comments)    Tired, weakness    Carbidopa-Levodopa Anxiety   Chlorhexidine Gluconate [Chlorhexidine] Hives and Rash   Clindamycin Rash   Colesevelam Other (See Comments)    tired    Doxazosin Rash   Lovastatin Other (See Comments)    Tired, nervousness    Rosuvastatin Other (See Comments)    Tired, weakness     Family History  Problem Relation Age of Onset   Diabetes Father     Prior to Admission medications   Medication Sig Start Date End Date Taking? Authorizing Provider  acetaminophen (TYLENOL) 500 MG tablet Take 500 mg by mouth 3 (three) times daily.    [provider]  amiodarone (PACERONE) 200 MG tablet Take 1 tablet (200 mg total) by mouth daily. 09/26/20   Mercy Riding, MD  amitriptyline (ELAVIL) 10 MG tablet Take 1 tablet (10 mg total) by mouth at bedtime. 10/29/20   Nita Sells, MD  apixaban (ELIQUIS) 5 MG TABS tablet Take 1 tablet (5 mg total) by mouth 2 (two) times daily. 09/29/20 10/29/20  Mercy Riding, MD  DULoxetine (CYMBALTA) 30 MG capsule Take 1 capsule (30 mg total) by mouth daily. 10/29/20   Nita Sells, MD  ferrous sulfate 325 (65 FE) MG EC tablet Take 325 mg by mouth daily.  05/28/18   [provider]  gabapentin (NEURONTIN) 300 MG capsule Take 1 capsule (300 mg total) by mouth at bedtime. 09/25/20   Mercy Riding, MD  LANTUS SOLOSTAR 100 UNIT/ML Solostar Pen Inject 8 Units into the skin 2 (two) times daily. 09/25/20   Mercy Riding, MD  melatonin 5 MG TABS Take 5 mg by mouth at bedtime.    [provider]  metFORMIN (GLUCOPHAGE-XR) 500 MG 24 hr tablet Take 500 mg by mouth daily with breakfast. 01/17/20   [provider]  metoprolol tartrate (LOPRESSOR)  25 MG tablet Take 0.5 tablets (12.5 mg total) by mouth 2 (two) times daily. 09/25/20   Mercy Riding, MD  [facility] [place] powder Apply 1 application topically in the morning and at bedtime. To rash on back and groin and sacrum 10/18/20   [provider]  polyethylene glycol powder (GLYCOLAX/MIRALAX) 17 GM/SCOOP powder Take 17 g by mouth daily.    [provider]  Rotigotine (NEUPRO) 8 MG/24HR PT24 Place 1 patch onto the skin every evening.    [provider]  Semaglutide,0.25 or 0.5MG /DOS, (OZEMPIC, 0.25 OR 0.5 MG/DOSE,) 2 MG/1.5ML SOPN Inject 0.5 mg into the skin every Tuesday.    [provider]  senna-docusate (SENOKOT-S)  8.6-50 MG tablet Take 1 tablet by mouth 2 (two) times daily as needed for moderate constipation. 09/25/20   Mercy Riding, MD  thiamine 100 MG tablet Take 1 tablet (100 mg total) by mouth daily. 09/26/20   Mercy Riding, MD  traMADol (ULTRAM) 50 MG tablet Take 1 tablet (50 mg total) by mouth daily. 10/29/20   Nita Sells, MD    Physical Exam: Vitals:   12/29/20 1248 12/29/20 1249 12/29/20 1500  BP: (!) 155/72  (!) 143/74  Pulse: 66  64  Resp: 16  16  Temp: 98.4 F (36.9 C)    TempSrc: Oral    SpO2: 99%  100%  Weight:  68 kg   Height:  5\' 10"  (1.778 m)      General:  Appears calm and comfortable and is in NAD; hip pain with movement Eyes:  EOMI, normal lids, iris ENT: hard of hearing,  grossly normal lips & tongue, mmm Neck:  no LAD, masses or thyromegaly Cardiovascular:  RRR, no m/r/g. No LE edema.  Respiratory:   CTA bilaterally with no wheezes/rales/rhonchi.  Normal respiratory effort.   Abdomen:  soft, NT, ND Skin:  no rash or induration seen on limited exam Musculoskeletal:  No obvious deformities, able to wiggle toes but more limited on the L Lower extremity:  No LE edema.  Limited foot exam with no ulcerations.  2+ distal pulses. Psychiatric:  grossly normal mood and affect, speech fluent and appropriate, A&O x  3 Neurologic:  CN 2-12 grossly intact, moves all extremities in coordinated fashion other than LLE    Radiological Exams on Admission: Independently reviewed - see discussion in A/P where applicable  DG Pelvis 1-2 Views  Result Date: 12/29/2020 CLINICAL DATA:  Left hip pain after fall. EXAM: PELVIS - 1-2 VIEW COMPARISON:  January 11, 2015. FINDINGS: There is no evidence of pelvic fracture or diastasis. No pelvic bone lesions are seen. IMPRESSION: Negative. Electronically Signed   By: Marijo Conception M.D.   On: 12/29/2020 14:17   DG Elbow Complete Left  Result Date: 12/29/2020 CLINICAL DATA:  Left elbow pain after fall. EXAM: LEFT ELBOW - COMPLETE 3+ VIEW COMPARISON:  None. FINDINGS: There is no evidence of fracture, dislocation, or joint effusion. There is no evidence of arthropathy or other focal bone abnormality. Soft tissues are unremarkable. IMPRESSION: Negative. Electronically Signed   By: Marijo Conception M.D.   On: 12/29/2020 14:15   DG Hand 2 View Left  Result Date: 12/29/2020 CLINICAL DATA:  Left hand pain after fall. EXAM: LEFT HAND - 2 VIEW COMPARISON:  None. FINDINGS: There is no evidence of fracture or dislocation. Severe degenerative changes seen involving the first carpometacarpal joint. Soft tissues are unremarkable. IMPRESSION: Severe osteoarthritis involving the first carpometacarpal joint. No acute abnormality seen. Electronically Signed   By: Marijo Conception M.D.   On: 12/29/2020 14:13   CT Hip Left Wo Contrast  Result Date: 12/29/2020 CLINICAL DATA:  Left hip pain after fall. EXAM: CT OF THE LEFT HIP WITHOUT CONTRAST TECHNIQUE: Multidetector CT imaging of the left hip was performed according to the standard protocol. Multiplanar CT image reconstructions were also generated. COMPARISON:  Pelvis and left femur x-rays from same day. FINDINGS: Bones/Joint/Cartilage Acute mildly impacted subcapital left femoral neck fracture. No dislocation. Joint spaces are preserved. No joint  effusion. Ligaments Ligaments are suboptimally evaluated by CT. Muscles and Tendons Grossly intact. Soft tissue No fluid collection or hematoma.  No soft tissue mass. IMPRESSION: 1.  Acute mildly impacted subcapital left femoral neck fracture. Electronically Signed   By: Titus Dubin M.D.   On: 12/29/2020 15:29   DG Shoulder Left  Result Date: 12/29/2020 CLINICAL DATA:  Left shoulder pain after fall. EXAM: LEFT SHOULDER - 2+ VIEW COMPARISON:  None. FINDINGS: There is no evidence of fracture or dislocation. Moderate degenerative changes seen involving the left glenohumeral and acromioclavicular joints. Soft tissues are unremarkable. IMPRESSION: Moderate degenerative joint disease is seen involving the left glenohumeral and acromioclavicular joints. No acute abnormality is noted. Electronically Signed   By: Marijo Conception M.D.   On: 12/29/2020 14:16   DG Femur Min 2 Views Left  Result Date: 12/29/2020 CLINICAL DATA:  LEFT hip pain after fall EXAM: LEFT FEMUR 2 VIEWS COMPARISON:  None. FINDINGS: There is varus angulation of the LEFT femoral head in relation to the femoral neck. There is a cortical step-off along the inferior medial margin of the femoral neck. No fracture of the femoral shaft. The knee joint appears normal on two views. IMPRESSION: Concern for LEFT femoral neck fracture with varus angulation. Consider dedicated exam of the LEFT hip/pelvis or CT LEFT hip. Electronically Signed   By: Suzy Bouchard M.D.   On: 12/29/2020 14:12    EKG: pending   Labs on Admission: I have personally reviewed the available labs and imaging studies at the time of the admission.  Pertinent labs:   Glucose 132 WBC 9.6 Hgb 12.0, down from 12.4 on 9/11   Assessment/Plan Principal Problem:   Hip fracture (HCC) Active Problems:   Type II diabetes mellitus ([place])   OBSTRUCTIVE SLEEP APNEA   Essential hypertension   Atherosclerotic heart disease of native coronary artery without angina pectoris    Anxiety   PAF (paroxysmal atrial fibrillation) (HCC)   Hip fracture -Mechanical fall resulting in hip fracture -Orthopedics consulted, for repair tomorrow AM -NPO after midnight in anticipation of surgical repair tomorrow -SCDs overnight, start Lovenox post-operatively (or as per ortho) -EKG is pending -Pain control with Robxain, Vicodin, and Fentanyl prn -TOC team consult for rehab placement - likely to go back to [facility] -Will need PT consult post-operatively -Hip fracture order set utilized  CAD -s/p stents -EKG pending -No c/o chest pain  Afib -Continue Amiodarone, Lopressor for rate control -Hold Eliquis  Mood d/o -?mild cognitive impairment -Continue Elavil, Cymbalta, melatonin  HTN -Continue Lopressor  DM -Will check A1c -Continue Lantus -Hold Glucophage, Ozempic -Cover with moderate-scale SSI  -Continue Neurontin  OSA -Continue CPAP   Level of care: Med-Surg  DVT prophylaxis:  SCDs until approved for Lovenox by orthopedics Code Status:  Full - confirmed with patient/family Family Communication: Wife was present for majority of evaluation Disposition Plan:  SNF rehab Consults called: Orthopedics; TOC team, Nutrition; will need PT post-operatively  Admission status: Admit - It is my clinical opinion that admission to INPATIENT is reasonable and necessary because of the expectation that this patient will require Hartman care that crosses at least 2 midnights to treat this condition based on the medical complexity of the problems presented.  Given the aforementioned information, the predictability of an adverse outcome is felt to be significant.    Karmen Bongo MD [facility]   How to contact the [facility] Attending or Consulting provider [place] or covering provider during after hours [place], for this patient?  Check the care team in [facility] and look for a) attending/consulting TR[facility] provider listed and b) the [facility] team listed Log into [email or web address]  and  use [place]'s universal password to access. If you do not have the password, please contact the [facility]man operator. Locate the [facility] provider you are looking for under [facility] and page to a number that you can be directly reached. If you still have difficulty reaching the provider, please page the [facility] (Director on Call) for the Hospitalists listed on amion for assistance.   12/29/2020, 5:03 PM

## 2020-12-29 NOTE — ED Provider Notes (Signed)
Refer to previous provider at 3:21 PM.  85 y.o. male who presents for evaluation of left hip pain after a fall, with concern for left hip fracture.  Please see original providers note for full HPI.  Plan at signout includes: - Follow up CT  - Admit to hospitalist  Physical Exam  BP (!) 143/74   Pulse 64   Temp 98.4 F (36.9 C) (Oral)   Resp 16   Ht 5\' 10"  (1.778 m)   Wt 68 kg   SpO2 100%   BMI 21.52 kg/m   Physical Exam Vitals and nursing note reviewed.  Constitutional:      Appearance: He is well-developed.  HENT:     Head: Normocephalic and atraumatic.  Eyes:     Conjunctiva/sclera: Conjunctivae normal.  Cardiovascular:     Rate and Rhythm: Normal rate and regular rhythm.     Heart sounds: No murmur heard. Pulmonary:     Effort: Pulmonary effort is normal. No respiratory distress.     Breath sounds: Normal breath sounds.  Abdominal:     Palpations: Abdomen is soft.     Tenderness: There is no abdominal tenderness.  Musculoskeletal:        General: Tenderness present.     Cervical back: Neck supple.     Comments: Left greater trochanter is tender to palpation.  Neurovascularly intact.  Skin:    General: Skin is warm and dry.  Neurological:     Mental Status: He is alert.    ED Course/Procedures     Procedures  MDM  CT of the left hip demonstrates an acute mildly impacted subcapital left femoral neck fracture.  Orthopedic surgery plans to proceed to the OR tomorrow for definitive management.  Patient to be made n.p.o. at midnight.  Case was discussed with hospitalist, who agreed to admit the patient for further management.       Violet Baldy, MD 12/30/20 1153    Drenda Freeze, MD 12/31/20 1500

## 2020-12-29 NOTE — Plan of Care (Signed)
Called to discuss the care of Jason Hartman.  He is status post fall 2 days prior while at [facility] subsequently had left hip pain and difficulty bearing weight.  Presented today to the emergency room for further evaluation of the left hip.  On x-ray and CT he does have evidence of a displaced femoral neck fracture.  Particular in the coronal plane there is significant extension into the femoral head with valgus displacement, as such I would recommend left hip hemiarthroplasty.  Please make n.p.o. at midnight in preparation for surgery 9/24.  Formal consult note to follow

## 2020-12-30 ENCOUNTER — Encounter (HOSPITAL_COMMUNITY): Payer: Self-pay | Admitting: Internal Medicine

## 2020-12-30 ENCOUNTER — Encounter (HOSPITAL_COMMUNITY)
Admission: EM | Disposition: A | Discharge status: Skilled Nursing Facility | Payer: Self-pay | Source: Skilled Nursing Facility | Attending: Internal Medicine

## 2020-12-30 ENCOUNTER — Inpatient Hospital Stay (HOSPITAL_COMMUNITY): Payer: Medicare Other | Admitting: Certified Registered Nurse Anesthetist

## 2020-12-30 ENCOUNTER — Inpatient Hospital Stay (HOSPITAL_COMMUNITY): Payer: Medicare Other

## 2020-12-30 DIAGNOSIS — A419 Sepsis, unspecified organism: Secondary | ICD-10-CM | POA: Diagnosis not present

## 2020-12-30 DIAGNOSIS — S72002A Fracture of unspecified part of neck of left femur, initial encounter for closed fracture: Secondary | ICD-10-CM

## 2020-12-30 DIAGNOSIS — E785 Hyperlipidemia, unspecified: Secondary | ICD-10-CM | POA: Diagnosis not present

## 2020-12-30 DIAGNOSIS — G4733 Obstructive sleep apnea (adult) (pediatric): Secondary | ICD-10-CM | POA: Diagnosis not present

## 2020-12-30 HISTORY — PX: ANTERIOR APPROACH HEMI HIP ARTHROPLASTY: SHX6690

## 2020-12-30 LAB — CBC
HCT: 35.7 % — ABNORMAL LOW (ref 39.0–52.0)
Hemoglobin: 11.5 g/dL — ABNORMAL LOW (ref 13.0–17.0)
MCH: 29.3 pg (ref 26.0–34.0)
MCHC: 32.2 g/dL (ref 30.0–36.0)
MCV: 90.8 fL (ref 80.0–100.0)
Platelets: 225 10*3/uL (ref 150–400)
RBC: 3.93 MIL/uL — ABNORMAL LOW (ref 4.22–5.81)
RDW: 14.3 % (ref 11.5–15.5)
WBC: 8.4 10*3/uL (ref 4.0–10.5)
nRBC: 0 % (ref 0.0–0.2)

## 2020-12-30 LAB — BASIC METABOLIC PANEL
Anion gap: 7 (ref 5–15)
BUN: 18 mg/dL (ref 8–23)
CO2: 30 mmol/L (ref 22–32)
Calcium: 9.1 mg/dL (ref 8.9–10.3)
Chloride: 100 mmol/L (ref 98–111)
Creatinine, Ser: 1.09 mg/dL (ref 0.61–1.24)
GFR, Estimated: >60 mL/min (ref >60)
Glucose, Bld: 114 mg/dL — ABNORMAL HIGH (ref 70–99)
Potassium: 4.2 mmol/L (ref 3.5–5.1)
Sodium: 137 mmol/L (ref 135–145)

## 2020-12-30 LAB — GLUCOSE, CAPILLARY
Glucose-Capillary: 128 mg/dL — ABNORMAL HIGH (ref 70–99)
Glucose-Capillary: 123 mg/dL — ABNORMAL HIGH (ref 70–99)
Glucose-Capillary: 134 mg/dL — ABNORMAL HIGH (ref 70–99)
Glucose-Capillary: 355 mg/dL — ABNORMAL HIGH (ref 70–99)
Glucose-Capillary: 259 mg/dL — ABNORMAL HIGH (ref 70–99)

## 2020-12-30 LAB — SURGICAL PCR SCREEN
MRSA, PCR: POSITIVE — AB
Staphylococcus aureus: POSITIVE — AB

## 2020-12-30 IMAGING — CR DG HIP (WITH PELVIS) OPERATIVE*L*
1 series · 1 of 1 positions shown · non-contrast
Comparison: [DATE]

CLINICAL DATA: LEFT hip arthroplasty

EXAM:
OPERATIVE LEFT HIP 1 VIEWS
TECHNIQUE: Fluoroscopic spot image(s) were submitted for interpretation
post-operatively.

[AP]
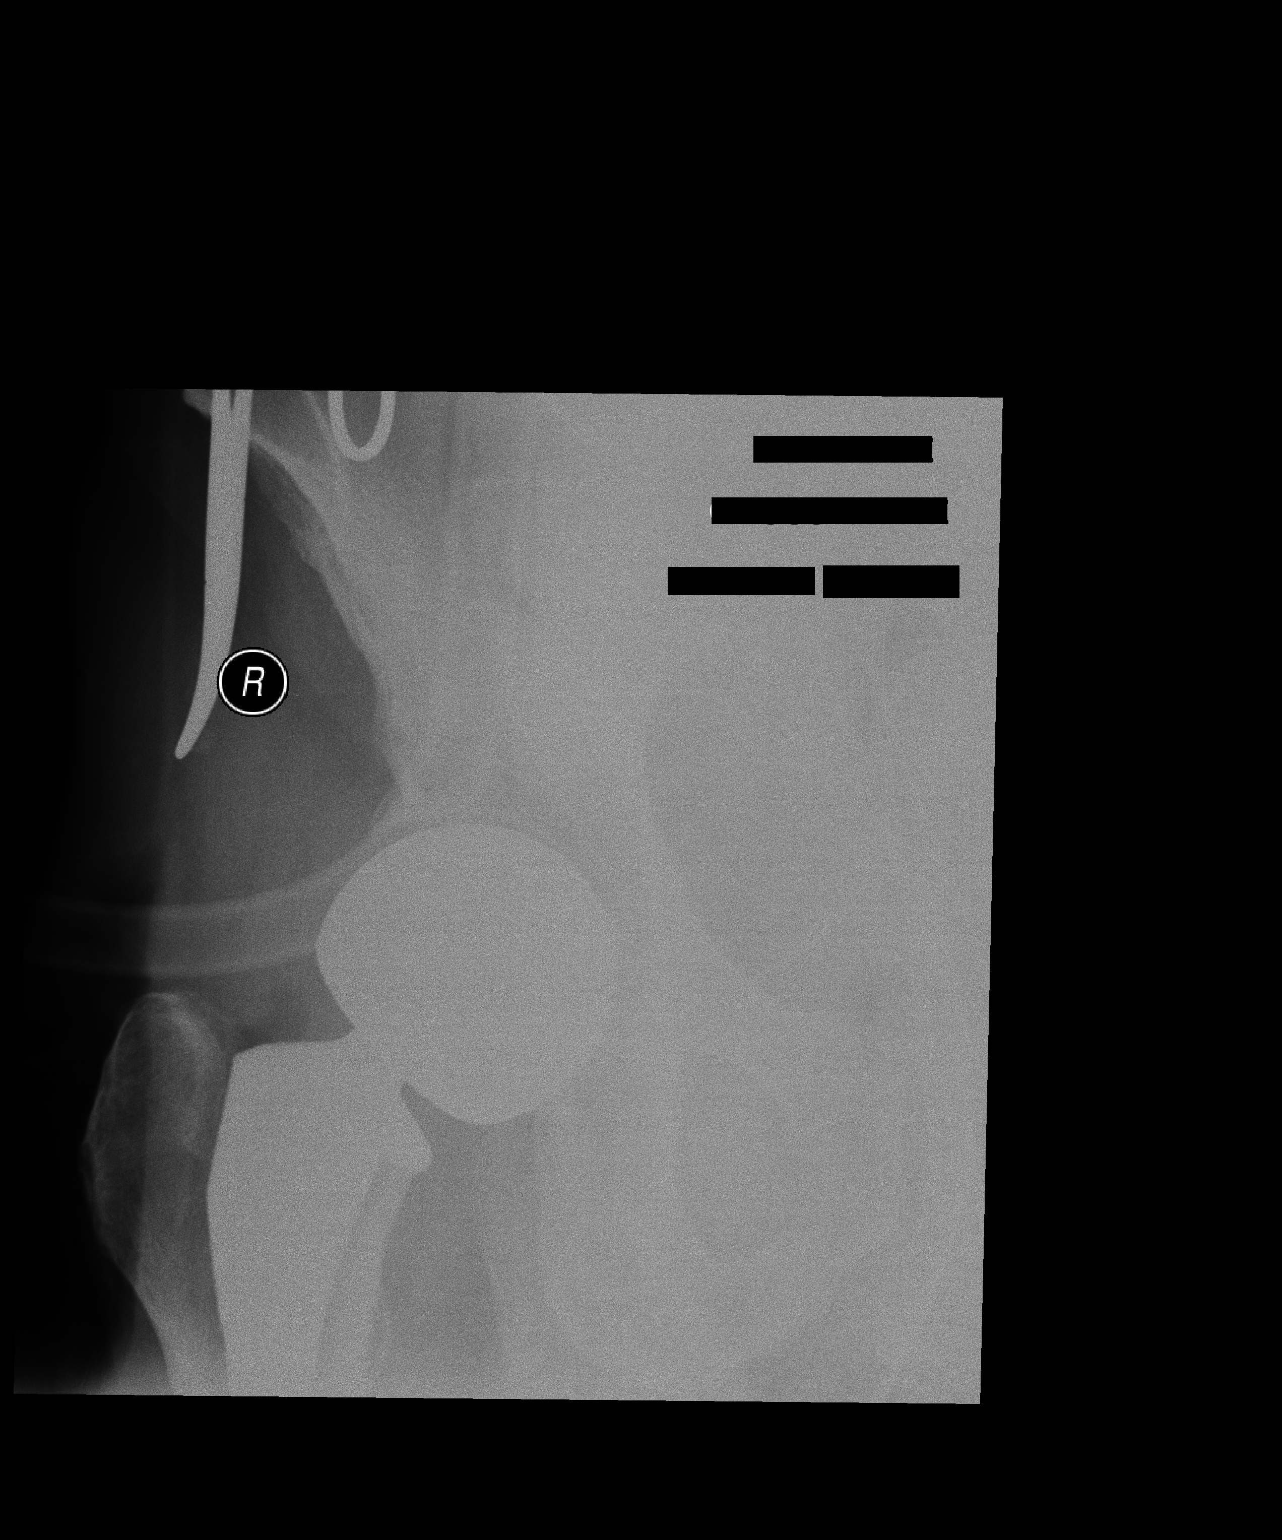

[1 of 1 positions shown; findings below may reference images not displayed]

FINDINGS: Spot fluoroscopy images were obtained for surgical planning
purposes. Partial visualization of a LEFT hip arthroplasty. Clamp
projects over the upper pelvis.
IMPRESSION: Spot fluoroscopy images were obtained for surgical planning
purposes.

## 2020-12-30 IMAGING — DX DG PELVIS 1-2V
1 series · 1 of 1 positions shown · non-contrast
Comparison: [DATE]

CLINICAL DATA: Postop left hip arthroplasty.

EXAM:
PELVIS - 1 VIEW

[pelvis]
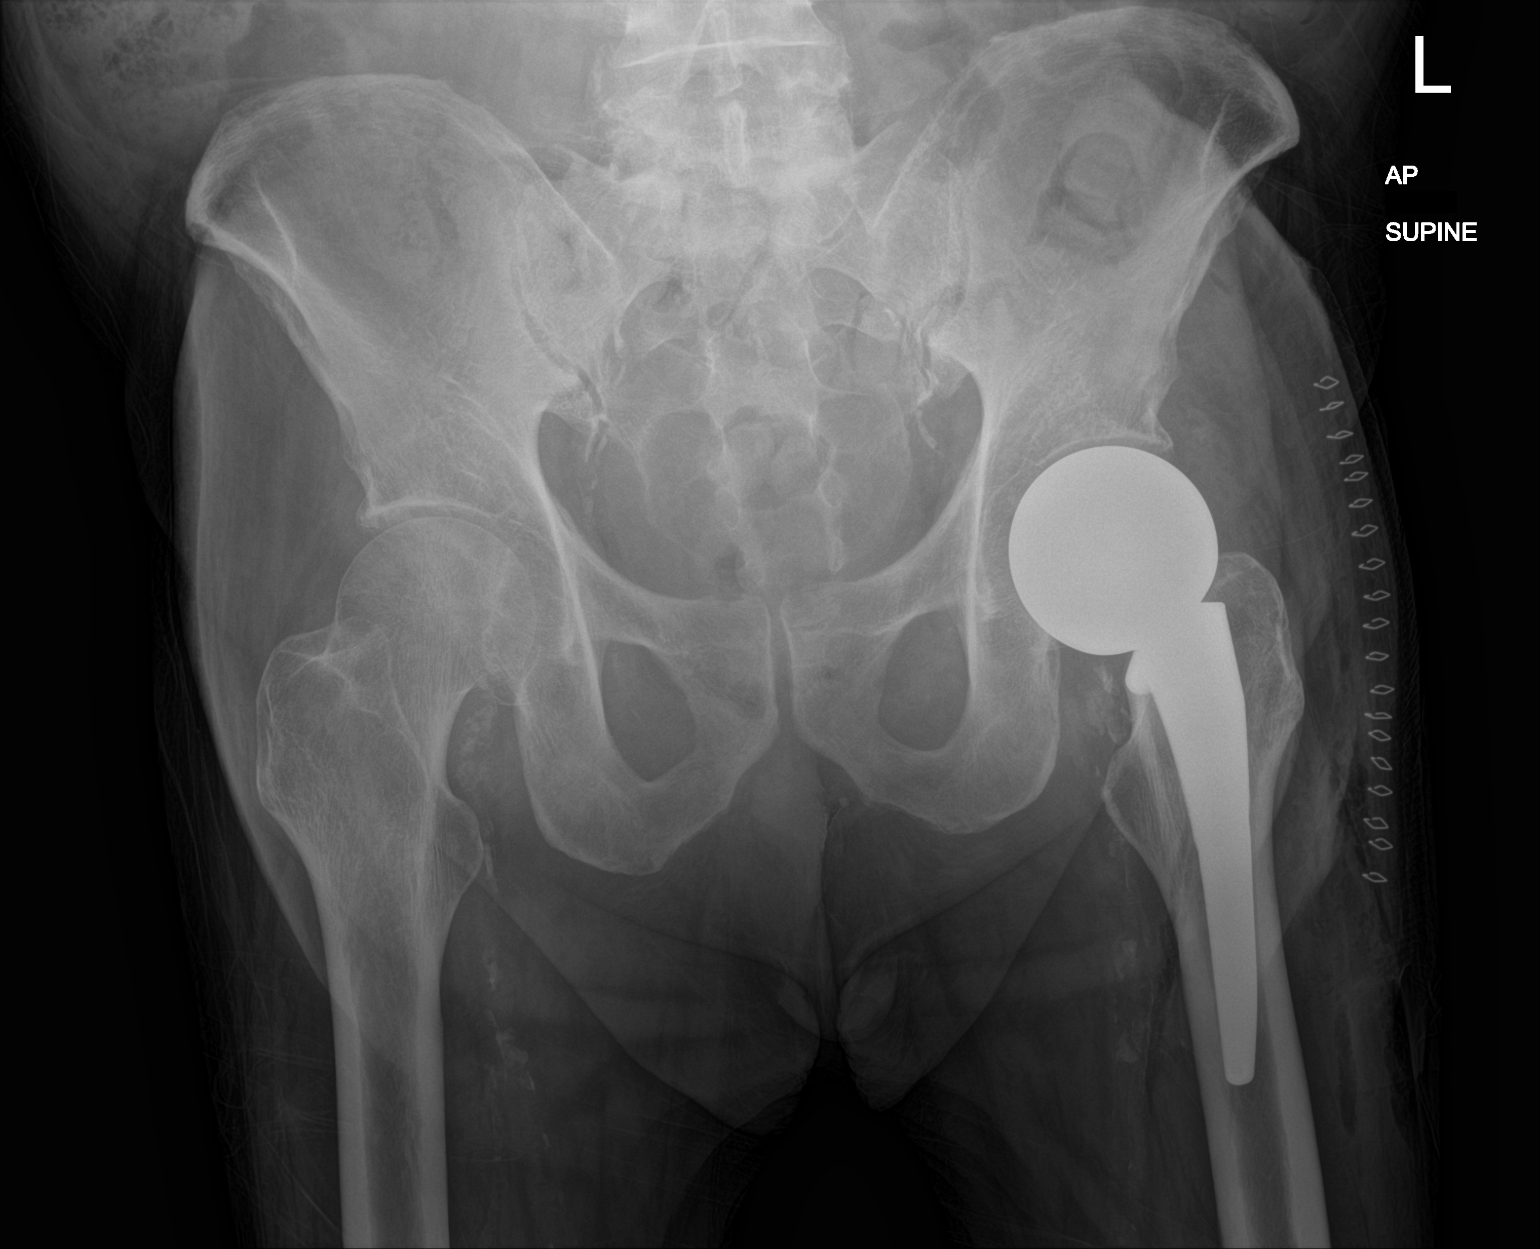

[1 of 1 positions shown; findings below may reference images not displayed]

FINDINGS: Single AP pelvis radiograph demonstrates a left hip hemiarthroplasty
that appears well seated and aligned.

No acute fracture or evidence of an operative complication.
IMPRESSION: Well-positioned left hip hemiarthroplasty.

## 2020-12-30 SURGERY — HEMIARTHROPLASTY, HIP, DIRECT ANTERIOR APPROACH, FOR FRACTURE
Anesthesia: General | Laterality: Left

## 2020-12-30 MED ORDER — CEFAZOLIN SODIUM-DEXTROSE 2-4 GM/100ML-% IV SOLN
2.0000 g | INTRAVENOUS | Status: AC
  Administered 2020-12-30: 2 g via INTRAVENOUS

## 2020-12-30 MED ORDER — LIDOCAINE 2% (20 MG/ML) 5 ML SYRINGE
INTRAMUSCULAR | Status: DC | PRN
  Administered 2020-12-30: 30 mg via INTRAVENOUS

## 2020-12-30 MED ORDER — 0.9 % SODIUM CHLORIDE (POUR BTL) OPTIME
TOPICAL | Status: DC | PRN
  Administered 2020-12-30: 1000 mL

## 2020-12-30 MED ORDER — FENTANYL CITRATE (PF) 100 MCG/2ML IJ SOLN
INTRAMUSCULAR | Status: AC
  Filled 2020-12-30: qty 2

## 2020-12-30 MED ORDER — ADULT MULTIVITAMIN W/MINERALS CH
1.0000 | ORAL_TABLET | Freq: Every day | ORAL | Status: DC
  Administered 2020-12-31 – 2021-01-08 (×9): 1 via ORAL
  Filled 2020-12-30 (×9): qty 1

## 2020-12-30 MED ORDER — TRANEXAMIC ACID-NACL 1000-0.7 MG/100ML-% IV SOLN
INTRAVENOUS | Status: AC
  Filled 2020-12-30: qty 100

## 2020-12-30 MED ORDER — FENTANYL CITRATE PF 50 MCG/ML IJ SOSY
PREFILLED_SYRINGE | INTRAMUSCULAR | Status: AC
  Administered 2020-12-30: 50 ug via INTRAVENOUS
  Filled 2020-12-30: qty 1

## 2020-12-30 MED ORDER — FENTANYL CITRATE (PF) 100 MCG/2ML IJ SOLN
25.0000 ug | INTRAMUSCULAR | Status: DC | PRN
  Administered 2020-12-30 (×3): 50 ug via INTRAVENOUS

## 2020-12-30 MED ORDER — CHLORHEXIDINE GLUCONATE 0.12 % MT SOLN: 15.0000 mL | Freq: Once | OROMUCOSAL | Status: AC

## 2020-12-30 MED ORDER — ORAL CARE MOUTH RINSE
15.0000 mL | Freq: Once | OROMUCOSAL | Status: AC
  Administered 2020-12-30: 15 mL via OROMUCOSAL

## 2020-12-30 MED ORDER — ONDANSETRON HCL 4 MG/2ML IJ SOLN
INTRAMUSCULAR | Status: AC
  Filled 2020-12-30: qty 2

## 2020-12-30 MED ORDER — LORAZEPAM 2 MG/ML IJ SOLN
INTRAMUSCULAR | Status: AC
  Filled 2020-12-30: qty 1

## 2020-12-30 MED ORDER — ACETAMINOPHEN 500 MG PO TABS
ORAL_TABLET | ORAL | Status: AC
  Administered 2020-12-30: 1000 mg via ORAL
  Filled 2020-12-30: qty 2

## 2020-12-30 MED ORDER — PROPOFOL 500 MG/50ML IV EMUL
INTRAVENOUS | Status: DC | PRN
  Administered 2020-12-30: 50 ug/kg/min via INTRAVENOUS

## 2020-12-30 MED ORDER — EPHEDRINE SULFATE-NACL 50-0.9 MG/10ML-% IV SOSY
PREFILLED_SYRINGE | INTRAVENOUS | Status: DC | PRN
Start: 2020-12-30 — End: 2020-12-30
  Administered 2020-12-30: 10 mg via INTRAVENOUS
  Administered 2020-12-30: 15 mg via INTRAVENOUS

## 2020-12-30 MED ORDER — LIDOCAINE HCL (PF) 2 % IJ SOLN
INTRAMUSCULAR | Status: AC
  Filled 2020-12-30: qty 5

## 2020-12-30 MED ORDER — PROPOFOL 10 MG/ML IV BOLUS
INTRAVENOUS | Status: DC | PRN
  Administered 2020-12-30 (×2): 20 mg via INTRAVENOUS
  Administered 2020-12-30 (×2): 30 mg via INTRAVENOUS

## 2020-12-30 MED ORDER — KETAMINE HCL 50 MG/5ML IJ SOSY
PREFILLED_SYRINGE | INTRAMUSCULAR | Status: AC
  Filled 2020-12-30: qty 5

## 2020-12-30 MED ORDER — PHENYLEPHRINE HCL-NACL 20-0.9 MG/250ML-% IV SOLN
INTRAVENOUS | Status: DC | PRN
  Administered 2020-12-30: 40 ug/min via INTRAVENOUS

## 2020-12-30 MED ORDER — LACTATED RINGERS IV SOLN: INTRAVENOUS | Status: DC

## 2020-12-30 MED ORDER — LORAZEPAM 2 MG/ML IJ SOLN
0.5000 mg | INTRAMUSCULAR | Status: AC | PRN
Start: 2020-12-30 — End: 2020-12-30
  Administered 2020-12-30 (×2): 0.5 mg via INTRAVENOUS
  Filled 2020-12-30: qty 1

## 2020-12-30 MED ORDER — CEFAZOLIN SODIUM-DEXTROSE 2-4 GM/100ML-% IV SOLN
INTRAVENOUS | Status: AC
  Filled 2020-12-30: qty 100

## 2020-12-30 MED ORDER — DEXAMETHASONE SODIUM PHOSPHATE 10 MG/ML IJ SOLN
INTRAMUSCULAR | Status: DC | PRN
  Administered 2020-12-30: 5 mg via INTRAVENOUS

## 2020-12-30 MED ORDER — MUPIROCIN 2 % EX OINT
1.0000 "application " | TOPICAL_OINTMENT | Freq: Two times a day (BID) | CUTANEOUS | Status: AC
  Administered 2020-12-30 – 2021-01-03 (×10): 1 via NASAL
  Filled 2020-12-30 (×6): qty 22

## 2020-12-30 MED ORDER — CHLORHEXIDINE GLUCONATE 0.12 % MT SOLN
OROMUCOSAL | Status: AC
  Filled 2020-12-30: qty 15

## 2020-12-30 MED ORDER — FENTANYL CITRATE (PF) 250 MCG/5ML IJ SOLN
INTRAMUSCULAR | Status: AC
  Filled 2020-12-30: qty 5

## 2020-12-30 MED ORDER — ENSURE ENLIVE PO LIQD
237.0000 mL | Freq: Two times a day (BID) | ORAL | Status: DC
  Administered 2021-01-01: 237 mL via ORAL

## 2020-12-30 MED ORDER — POLYETHYLENE GLYCOL 3350 17 G PO PACK
17.0000 g | PACK | Freq: Two times a day (BID) | ORAL | Status: DC
  Administered 2020-12-30 – 2021-01-08 (×17): 17 g via ORAL
  Filled 2020-12-30 (×18): qty 1

## 2020-12-30 MED ORDER — FENTANYL CITRATE PF 50 MCG/ML IJ SOSY
50.0000 ug | PREFILLED_SYRINGE | INTRAMUSCULAR | Status: DC | PRN
  Administered 2020-12-31: 50 ug via INTRAVENOUS
  Filled 2020-12-30: qty 1

## 2020-12-30 MED ORDER — CLONAZEPAM 0.5 MG PO TABS
0.5000 mg | ORAL_TABLET | Freq: Two times a day (BID) | ORAL | Status: DC
  Administered 2020-12-30 – 2021-01-04 (×10): 0.5 mg via ORAL
  Filled 2020-12-30 (×10): qty 1

## 2020-12-30 MED ORDER — EPHEDRINE 5 MG/ML INJ
INTRAVENOUS | Status: AC
  Filled 2020-12-30: qty 5

## 2020-12-30 MED ORDER — PROPOFOL 10 MG/ML IV BOLUS
INTRAVENOUS | Status: AC
  Filled 2020-12-30: qty 20

## 2020-12-30 MED ORDER — ACETAMINOPHEN 500 MG PO TABS: 1000.0000 mg | ORAL_TABLET | Freq: Once | ORAL | Status: AC

## 2020-12-30 MED ORDER — ONDANSETRON HCL 4 MG/2ML IJ SOLN
INTRAMUSCULAR | Status: DC | PRN
  Administered 2020-12-30: 4 mg via INTRAVENOUS

## 2020-12-30 MED ORDER — BUPIVACAINE IN DEXTROSE 0.75-8.25 % IT SOLN
INTRATHECAL | Status: DC | PRN
  Administered 2020-12-30: 15 mg via INTRATHECAL

## 2020-12-30 MED ORDER — TRANEXAMIC ACID-NACL 1000-0.7 MG/100ML-% IV SOLN
1000.0000 mg | INTRAVENOUS | Status: AC
  Administered 2020-12-30: 1000 mg via INTRAVENOUS

## 2020-12-30 SURGICAL SUPPLY — 52 items
BAG COUNTER SPONGE SURGICOUNT (BAG) ×2 IMPLANT
BAG SPNG CNTER NS LX DISP (BAG) ×1
BLADE SAW SGTL 18X1.27X75 (BLADE) ×2 IMPLANT
COVER PERINEAL POST (MISCELLANEOUS) ×2 IMPLANT
COVER SURGICAL LIGHT HANDLE (MISCELLANEOUS) ×2 IMPLANT
DRAPE C-ARM 42X72 X-RAY (DRAPES) ×2 IMPLANT
DRAPE HALF SHEET 40X57 (DRAPES) IMPLANT
DRAPE U-SHAPE 47X51 STRL (DRAPES) ×4 IMPLANT
DRSG AQUACEL AG ADV 3.5X10 (GAUZE/BANDAGES/DRESSINGS) ×1 IMPLANT
DRSG AQUACEL AG ADV 3.5X14 (GAUZE/BANDAGES/DRESSINGS) IMPLANT
DURAPREP 26ML APPLICATOR (WOUND CARE) ×1 IMPLANT
ELECT BLADE 4.0 EZ CLEAN MEGAD (MISCELLANEOUS) ×2
ELECT REM PT RETURN 9FT ADLT (ELECTROSURGICAL) ×2
ELECTRODE BLDE 4.0 EZ CLN MEGD (MISCELLANEOUS) IMPLANT
ELECTRODE REM PT RTRN 9FT ADLT (ELECTROSURGICAL) ×1 IMPLANT
GLOVE SRG 8 PF TXTR STRL LF DI (GLOVE) ×2 IMPLANT
GLOVE SURG ENC MOIS LTX SZ7.5 (GLOVE) ×2 IMPLANT
GLOVE SURG ENC MOIS LTX SZ8.5 (GLOVE) ×4 IMPLANT
GLOVE SURG UNDER POLY LF SZ8 (GLOVE) ×4
GLOVE SURG UNDER POLY LF SZ9 (GLOVE) ×2 IMPLANT
GOWN STRL REUS W/ TWL LRG LVL3 (GOWN DISPOSABLE) IMPLANT
GOWN STRL REUS W/ TWL XL LVL3 (GOWN DISPOSABLE) ×2 IMPLANT
GOWN STRL REUS W/TWL LRG LVL3 (GOWN DISPOSABLE)
GOWN STRL REUS W/TWL XL LVL3 (GOWN DISPOSABLE) ×4
HANDPIECE INTERPULSE COAX TIP (DISPOSABLE)
HEAD FEM UNIPOLAR 52 OD STRL (Hips) ×1 IMPLANT
HOOD PEEL AWAY FACE SHEILD DIS (HOOD) IMPLANT
KIT BASIN OR (CUSTOM PROCEDURE TRAY) ×2 IMPLANT
KIT TURNOVER KIT B (KITS) ×2 IMPLANT
MANIFOLD NEPTUNE II (INSTRUMENTS) ×2 IMPLANT
NEEDLE 22X1 1/2 (OR ONLY) (NEEDLE) ×1 IMPLANT
NS IRRIG 1000ML POUR BTL (IV SOLUTION) ×2 IMPLANT
PACK TOTAL JOINT (CUSTOM PROCEDURE TRAY) ×2 IMPLANT
PAD ARMBOARD 7.5X6 YLW CONV (MISCELLANEOUS) ×4 IMPLANT
PASSER SUT SWANSON 36MM LOOP (INSTRUMENTS) ×1 IMPLANT
SET HNDPC FAN SPRY TIP SCT (DISPOSABLE) IMPLANT
SPACER FEM TAPERED +0 12/14 (Hips) ×1 IMPLANT
STEM FEMORAL SZ 5MM STD ACTIS (Stem) ×1 IMPLANT
SUT ETHIBOND 2 V 37 (SUTURE) ×1 IMPLANT
SUT VIC AB 0 CTX 36 (SUTURE) ×6
SUT VIC AB 0 CTX36XBRD ANTBCTR (SUTURE) ×1 IMPLANT
SUT VIC AB 1 CTX 36 (SUTURE) ×2
SUT VIC AB 1 CTX36XBRD ANBCTR (SUTURE) ×1 IMPLANT
SUT VIC AB 2-0 CTB1 (SUTURE) ×4 IMPLANT
SUT VIC AB 3-0 CT1 27 (SUTURE) ×2
SUT VIC AB 3-0 CT1 TAPERPNT 27 (SUTURE) ×1 IMPLANT
SYR CONTROL 10ML LL (SYRINGE) ×4 IMPLANT
TOWEL GREEN STERILE (TOWEL DISPOSABLE) ×2 IMPLANT
TOWEL GREEN STERILE FF (TOWEL DISPOSABLE) ×2 IMPLANT
TRAY CATH 16FR W/PLASTIC CATH (SET/KITS/TRAYS/PACK) IMPLANT
TRAY FOLEY W/BAG SLVR 14FR (SET/KITS/TRAYS/PACK) IMPLANT
WATER STERILE IRR 1000ML POUR (IV SOLUTION) ×8 IMPLANT

## 2020-12-30 NOTE — Op Note (Addendum)
Date of Surgery: 12/30/2020  INDICATIONS: Jason Hartman is an 85 y.o.-year-old male with a displaced left femoral neck fracture.  The risk and benefits of the procedure with discussed in detail and documented in the pre-operative evaluation.  PREOPERATIVE DIAGNOSIS: left displaced femoral neck   POSTOPERATIVE DIAGNOSIS: Same.  PROCEDURE: Left hip hemiarthroplasty  SURGEON: Yevonne Pax MD  ASSISTANT: Shirley Friar, ATC; necessary for the timely completion of procedure and due to complexity of procedure.  ANESTHESIA:  general  IV FLUIDS AND URINE: See anesthesia record.  ANTIBIOTICS: Ancef 2g  ESTIMATED BLOOD LOSS: 50 mL.  IMPLANTS:  Dupuy Actis press fit size 5 stem, standard offset, monopolar 52 mm head  DRAINS: None  CULTURES: None  COMPLICATIONS: none  DESCRIPTION OF PROCEDURE:  The patient was identified in the preoperative holding area.  The correct operative side was identified and a timeout was performed according to universal protocol with nursing.  2g of Ancef was given prior to coming back to the operating room.  He was subsequently brought back to the operating room.  Anesthesia was induced after an epidural was performed.  The patient was placed in the lateral position using a beanbag positioner.  The correct operative side was up.  Patient was prepped and draped in the usual sterile fashion.  Of note the patient does have an allergy to both chlorhexidine and Betadine, and as such chlorhexidine was utilized but thoroughly washed off at the end of the surgery.  An incision was made centered over the vastus ridge about the lateral aspect of the hip.  Sharp dissection was performed on the level of the IT band electrocautery was used to perform hemostasis.  A nick was then made in the IT band and Mayo scissors were utilized to split the IT band both proximal and distal.  We then dissected off the bursal tissue using Mayo scissors and a body.  We identified a split in the  gluteus medius between the one third and posterior two thirds of the tendon.  A sponge was placed in the splint for temporary visualization.  We then marked out our cuff of tissue continuous with a split distally.  Electrocautery was utilized to elevate this off of the bone and a cuff in conjunction with the medius and the minimus.  External rotation was utilized to help dissect the tissue off of the bone.  We tagged this cuff of tissue with an 0 Vicryl.  We then continue this split through the hip capsule.  We use this to expose the femoral neck.  A provisional cut was then made with a saw on the femur.  This gave [place] better visualization to see the femoral had a power corkscrew was utilized to remove this.  We then irrigated the acetabulum to ensure there were no more bony pieces in the acetabulum.  We were satisfied with this.  The leg was then brought forward with external rotation into the sterile bag to work on the femur.  His retractors as well as guidance were used for exposure.  We open the canal with the canal finder and subsequently broached up to a size 5 broach with excellent rotational stability.  A trial reduction was performed with good tension and no evidence of instability.  As result the broach was then removed and the wound was again irrigated.  The final size 5 Actis press-fit stem was placed with a standard offset neck.  We used a size 54 head according to our trialing which  had a good reduction.  Again reduction was performed with a good palpable clunk.  A flatplate x-ray was performed to show concentric reduction.  The wound was finally irrigated and closed in layers using 0 Vicryl for the medius tissue, 0 Vicryl for the IT band, 2-0 Vicryl for the subcutaneous tissue, and staples for the skin.  The prep was thoroughly washed off of them.  A sterile Aquacel dressing was placed.  He was awakened with no issue and taken to the postoperative unit.  All counts were correct at the end of the  case    Shirley Friar ATC was necessary for opening, closing, retracting, limb positioning and overall facilitation and timely completion of the procedure.     POSTOPERATIVE PLAN: He will be weightbearing as tolerated on the left leg and activity as tolerated.  We will perform a physical therapy consult so that he can be up and mobilize.  He may resume his home anticoagulation 1 day postoperative.  He will follow-up with me 2 weeks postop for staple removal and assessment.  Yevonne Pax, MD 12:29 PM

## 2020-12-30 NOTE — Anesthesia Procedure Notes (Signed)
Spinal  Patient location during procedure: OR End time: 12/30/2020 10:20 AM Reason for block: surgical anesthesia Staffing Performed: anesthesiologist  Anesthesiologist: Annye Asa, MD Preanesthetic Checklist Completed: patient identified, IV checked, site marked, risks and benefits discussed, surgical consent, monitors and equipment checked, pre-op evaluation and timeout performed Spinal Block Patient position: sitting Prep: DuraPrep and site prepped and draped Patient monitoring: blood pressure, continuous pulse ox, cardiac monitor and heart rate Approach: midline Location: L3-4 Injection technique: single-shot Needle Needle type: Pencan and Introducer  Needle gauge: 24 G Needle length: 9 cm Assessment Events: CSF return Additional Notes Pt identified in Operating room.  Monitors applied. Working IV access confirmed. Sterile prep, drape lumbar spine.  1% lido local L 3,4.  #24ga Pencan into clear CSF L 3,4.  15mg  0.75% Bupivacaine with dextrose injected with asp CSF beginning and end of injection.  Patient asymptomatic, VSS, no heme aspirated, tolerated well.  Jenita Seashore, MD

## 2020-12-30 NOTE — Brief Op Note (Signed)
   Brief Op Note  Date of Surgery: 12/30/2020  Preoperative Diagnosis: Left Hip Fracture  Postoperative Diagnosis: same  Procedure: Procedure(s): ANTERIOR APPROACH HIP ARTHROPLASTY  Implants: Implant Name Type Inv. Item Serial No. Manufacturer Lot No. LRB No. Used Action  STEM FEMORAL SZ 5MM STD ACTIS - [id number] Stem STEM FEMORAL SZ 5MM STD ACTIS  [employer] [id number] Left 1 Implanted  HIP BALL DEPUY - [id number] Hips HIP BALL DEPUY  DEPUY ORTHOPA[employer] [id number] Left 1 Implanted  SPACER TAPER DEPUY - [id number] Hips SPACER TAPER DEPUY  DEPUY ORTHOPA[employer] [id number] Left 1 Implanted    Surgeons: Surgeon(s): Vanetta Mulders, MD  Anesthesia: General    Estimated Blood Loss: See anesthesia record  Complications: None  Condition to PACU: Stable  Yevonne Pax, MD 12/30/2020 12:26 PM

## 2020-12-30 NOTE — Interval H&P Note (Signed)
History and Physical Interval Note:  12/30/2020 7:41 AM  Jason Hartman  has presented today for surgery, with the diagnosis of Left Hip Fracture.  The various methods of treatment have been discussed with the patient and family. After consideration of risks, benefits and other options for treatment, the patient has consented to  Procedure(s): [place] (Left) as a surgical intervention.  The patient's history has been reviewed, patient examined, no change in status, stable for surgery.  I have reviewed the patient's chart and labs.  Questions were answered to the patient's satisfaction.     Vanetta Mulders

## 2020-12-30 NOTE — Plan of Care (Signed)

## 2020-12-30 NOTE — Progress Notes (Signed)
PROGRESS NOTE    Jason Hartman  KDX:[id number] DOB: 1935-11-27 DOA: 12/29/2020 PCP: [name], MD   Chief Complaint  Patient presents with   Hip Pain   Brief Narrative Jason Hartman is Jason Hartman 85 y.o. male with medical history significant of HTN; OSA; RLS; and DM presenting with fall.  He is in [facility] for deconditioning after Lauryn Lizardi severe UTI.  He was attempting to transfer from wheelchair to bed without assistance when he fell and landed on his hip.  This happened 2-3 days ago and he thought it would get better but when he still couldn't bear weight he decided to come in.    Assessment & Plan:   Principal Problem:   Hip fracture ([place]) Active Problems:   Type II diabetes mellitus ([place])   OBSTRUCTIVE SLEEP APNEA   Essential hypertension   Atherosclerotic heart disease of native coronary artery without angina pectoris   Anxiety   PAF (paroxysmal atrial fibrillation) (H[facility])  Hip fracture -Mechanical fall resulting in hip fracture -L hip CT with mildly impacted subcapital L femoral neck fracture -plain films of L hand with severe OA, no acute abnormality. Plain films of shoulder with degenerative joint disease, no acute abnormality.  Negative L elbow.   -Orthopedics consulted, s/p L hemiarthroplasty 9/24 -resume eliquis 24 hrs post op -Pain control with Robxain, Vicodin, and Fentanyl prn  Testicular pain - [place] pending   CAD -s/p stents -EKG pending - sinus, first degree AV block, mildly prolonged QTc -No c/o chest pain   Afib -Continue Amiodarone, Lopressor for rate control -resume Qtc tomorrow   Mood d/o -?mild cognitive impairment -Continue Elavil, Cymbalta, melatonin   HTN -Continue Lopressor   DM -A1c 7.2 -Continue Lantus -Hold Glucophage, Ozempic -Cover with moderate-scale SSI  -Continue Neurontin   OSA -Continue CPAP  DVT prophylaxis: SCD Code Status:full  Family Communication: (wife at bedside Disposition:   Status is: Inpatient  Remains  inpatient appropriate because:Inpatient level of care appropriate due to severity of illness  Dispo: The patient is from:  [name]              Anticipated d/c is to:  pending              Patient currently is not medically stable to d/c.   Difficult to place patient No       Consultants:  orthopedics  Procedures: 9/24  Left hip hemiarthroplasty  Antimicrobials: Anti-infectives (From admission, onward)    Start     Dose/Rate Route Frequency Ordered Stop   12/30/20 0945  ceFAZolin (ANCEF) IVPB 2g/100 mL premix        2 g 200 mL/hr over 30 Minutes Intravenous On call to O.R. 12/30/20 0849 12/30/20 1036          Subjective: C/o L hip pain and testicular pain Asking for pain medicines  Objective: Vitals:   12/30/20 1315 12/30/20 1330 12/30/20 1345 12/30/20 1405  BP: (!) 113/57 (!) 113/57 106/60 121/69  Pulse: 70 71 71 78  Resp: 12 15 15 18   Temp:   97.6 F (36.4 C)   TempSrc:      SpO2: 100% 98% 100% 100%  Weight:      Height:        Intake/Output Summary (Last 24 hours) at 12/30/2020 1510 Last data filed at 12/30/2020 1224 Gross per 24 hour  Intake 1350 ml  Output 50 ml  Net 1300 ml   Filed Weights   12/29/20 1249  Weight: 68  kg    Examination:  General exam: Appears anxious and uncomfortable  Respiratory system: unlabored Cardiovascular system: RRR Gastrointestinal system: Abdomen is nondistended, soft and nontender. GU: retracted testicles, difficult to examine due to tenderness with light touch Central nervous system: Alert and oriented. No focal neurological deficits. Extremities: LLE with dressing intact Skin: No rashes, lesions or ulcers Psychiatry: Judgement and insight appear normal. Mood & affect appropriate.     Data Reviewed: I have personally reviewed following labs and imaging studies  CBC: Recent Labs  Lab 12/29/20 1502 12/30/20 0431  WBC 9.6 8.4  HGB 12.0* 11.5*  HCT 35.9* 35.7*  MCV 89.8 90.8  PLT 223 225     Basic Metabolic Panel: Recent Labs  Lab 12/29/20 1502 12/30/20 0431  NA 136 137  K 4.3 4.2  CL 101 100  CO2 26 30  GLUCOSE 132* 114*  BUN 18 18  CREATININE 0.96 1.09  CALCIUM 8.9 9.1    GFR: Estimated Creatinine Clearance: 50.3 mL/min (by C-G formula based on SCr of 1.09 mg/dL).  Liver Function Tests: No results for input(s): AST, ALT, ALKPHOS, BILITOT, PROT, ALBUMIN in the last 168 hours.  CBG: Recent Labs  Lab 12/29/20 1710 12/29/20 2143 12/30/20 0544 12/30/20 0836 12/30/20 1234  GLUCAP 153* 161* 128* 123* 134*     Recent Results (from the past 240 hour(s))  Resp Panel by RT-PCR (Flu Braedyn Riggle&B, Covid) Nasopharyngeal Swab     Status: None   Collection Time: 12/29/20  2:26 PM   Specimen: Nasopharyngeal Swab; Nasopharyngeal(NP) swabs in vial transport medium  Result Value Ref Range Status   SARS Coronavirus 2 by RT PCR NEGATIVE NEGATIVE Final    Comment: (NOTE) SARS-CoV-2 target nucleic acids are NOT DETECTED.  The SARS-CoV-2 RNA is generally detectable in upper respiratory specimens during the acute phase of infection. The lowest concentration of SARS-CoV-2 viral copies this assay can detect is 138 copies/mL. Emmilia Sowder negative result does not preclude SARS-Cov-2 infection and should not be used as the sole basis for treatment or other patient management decisions. Emmerie Battaglia negative result may occur with  improper specimen collection/handling, submission of specimen other than nasopharyngeal swab, presence of viral mutation(s) within the areas targeted by this assay, and inadequate number of viral copies(<138 copies/mL). Symphani Eckstrom negative result must be combined with clinical observations, patient history, and epidemiological information. The expected result is Negative.  Fact Sheet for Patients:  EntrepreneurPulse.com.au  Fact Sheet for Healthcare Providers:  IncredibleEmployment.be  This test is no t yet approved or cleared by the [place] FDA and  has been authorized for detection and/or diagnosis of SARS-CoV-2 by FDA under an Emergency Use Authorization (EUA). This EUA will remain  in effect (meaning this test can be used) for the duration of the COVID-19 declaration under Section 564(b)(1) of the Act, 21 U.S.C.section 360bbb-3(b)(1), unless the authorization is terminated  or revoked sooner.       Influenza [place] by PCR NEGATIVE NEGATIVE Final   Influenza B by PCR NEGATIVE NEGATIVE Final    Comment: (NOTE) The Xpert Xpress SARS-CoV-2/FLU/RSV plus assay is intended as an aid in the diagnosis of influenza from Nasopharyngeal swab specimens and should not be used as Laymond Postle sole basis for treatment. Nasal washings and aspirates are unacceptable for Xpert Xpress SARS-CoV-2/FLU/RSV testing.  Fact Sheet for Patients: EntrepreneurPulse.com.au  Fact Sheet for Healthcare Providers: IncredibleEmployment.be  This test is not yet approved or cleared by the [place] FDA and has been authorized for detection and/or diagnosis of SARS-CoV-2  by FDA under an Emergency Use Authorization (EUA). This EUA will remain in effect (meaning this test can be used) for the duration of the COVID-19 declaration under Section 564(b)(1) of the Act, 21 U.S.C. section 360bbb-3(b)(1), unless the authorization is terminated or revoked.  Performed at [facility], [place] [address] [place], [place] [address]   Surgical pcr screen     Status: Abnormal   Collection Time: 12/29/20 11:11 PM   Specimen: Nasal Mucosa; Nasal Swab  Result Value Ref Range Status   MRSA, PCR POSITIVE (Nesiah Jump) NEGATIVE Corrected    Comment: RESULT CALLED TO, READ BACK BY AND VERIFIED WITH: RN SYLVIA M. 12/30/20@1 :00 BY TW    Staphylococcus aureus POSITIVE (Whit Bruni) NEGATIVE Corrected    Comment: (NOTE) The Xpert SA Assay (FDA approved for NASAL specimens in patients 37 years of age and older), is one component of [place]  comprehensive surveillance program. It is not intended to diagnose infection nor to guide or monitor treatment. Performed at [facility], [place] [address] [place],  [address] CORRECTED ON 09/24 AT 0114: PREVIOUSLY REPORTED AS POSITIVE          Radiology Studies: DG Pelvis 1-2 Views  Result Date: 12/30/2020 CLINICAL DATA:  Postop left hip arthroplasty. EXAM: PELVIS - 1 VIEW COMPARISON:  12/29/2020 FINDINGS: Single AP pelvis radiograph demonstrates Isadore Bokhari left hip hemiarthroplasty that appears well seated and aligned. No acute fracture or evidence of an operative complication. IMPRESSION: Well-positioned left hip hemiarthroplasty. Electronically Signed   By: [name] M.D.   On: 12/30/2020 13:06   DG Pelvis 1-2 Views  Result Date: 12/29/2020 CLINICAL DATA:  Left hip pain after fall. EXAM: PELVIS - 1-2 VIEW COMPARISON:  January 11, 2015. FINDINGS: There is no evidence of pelvic fracture or diastasis. No pelvic bone lesions are seen. IMPRESSION: Negative. Electronically Signed   By: Marijo Conception M.D.   On: 12/29/2020 14:17   DG Elbow Complete Left  Result Date: 12/29/2020 CLINICAL DATA:  Left elbow pain after fall. EXAM: LEFT ELBOW - COMPLETE 3+ VIEW COMPARISON:  None. FINDINGS: There is no evidence of fracture, dislocation, or joint effusion. There is no evidence of arthropathy or other focal bone abnormality. Soft tissues are unremarkable. IMPRESSION: Negative. Electronically Signed   By: Marijo Conception M.D.   On: 12/29/2020 14:15   DG Hand 2 View Left  Result Date: 12/29/2020 CLINICAL DATA:  Left hand pain after fall. EXAM: LEFT HAND - 2 VIEW COMPARISON:  None. FINDINGS: There is no evidence of fracture or dislocation. Severe degenerative changes seen involving the first carpometacarpal joint. Soft tissues are unremarkable. IMPRESSION: Severe osteoarthritis involving the first carpometacarpal joint. No acute abnormality seen. Electronically Signed   By: Marijo Conception M.D.    On: 12/29/2020 14:13   CT Hip Left Wo Contrast  Result Date: 12/29/2020 CLINICAL DATA:  Left hip pain after fall. EXAM: CT OF THE LEFT HIP WITHOUT CONTRAST TECHNIQUE: Multidetector CT imaging of the left hip was performed according to the standard protocol. Multiplanar CT image reconstructions were also generated. COMPARISON:  Pelvis and left femur x-rays from same day. FINDINGS: Bones/Joint/Cartilage Acute mildly impacted subcapital left femoral neck fracture. No dislocation. Joint spaces are preserved. No joint effusion. Ligaments Ligaments are suboptimally evaluated by CT. Muscles and Tendons Grossly intact. Soft tissue No fluid collection or hematoma.  No soft tissue mass. IMPRESSION: 1. Acute mildly impacted subcapital left femoral neck fracture. Electronically Signed   By: Orville Govern.D.  On: 12/29/2020 15:29   DG Shoulder Left  Result Date: 12/29/2020 CLINICAL DATA:  Left shoulder pain after fall. EXAM: LEFT SHOULDER - 2+ VIEW COMPARISON:  None. FINDINGS: There is no evidence of fracture or dislocation. Moderate degenerative changes seen involving the left glenohumeral and acromioclavicular joints. Soft tissues are unremarkable. IMPRESSION: Moderate degenerative joint disease is seen involving the left glenohumeral and acromioclavicular joints. No acute abnormality is noted. Electronically Signed   By: Marijo Conception M.D.   On: 12/29/2020 14:16   DG HIP OPERATIVE UNILAT WITH PELVIS LEFT  Result Date: 12/30/2020 CLINICAL DATA:  LEFT hip arthroplasty EXAM: OPERATIVE LEFT HIP 1 VIEWS TECHNIQUE: Fluoroscopic spot image(s) were submitted for interpretation post-operatively. COMPARISON:  December 29, 2020 FINDINGS: Spot fluoroscopy images were obtained for surgical planning purposes. Partial visualization of Arvel Oquinn LEFT hip arthroplasty. Clamp projects over the upper pelvis. IMPRESSION: Spot fluoroscopy images were obtained for surgical planning purposes. Electronically Signed   By: Valentino Saxon M.D.   On: 12/30/2020 12:11   DG Femur Min 2 Views Left  Result Date: 12/29/2020 CLINICAL DATA:  LEFT hip pain after fall EXAM: LEFT FEMUR 2 VIEWS COMPARISON:  None. FINDINGS: There is varus angulation of the LEFT femoral head in relation to the femoral neck. There is Carmencita Cusic cortical step-off along the inferior medial margin of the femoral neck. No fracture of the femoral shaft. The knee joint appears normal on two views. IMPRESSION: Concern for LEFT femoral neck fracture with varus angulation. Consider dedicated exam of the LEFT hip/pelvis or CT LEFT hip. Electronically Signed   By: Suzy Bouchard M.D.   On: 12/29/2020 14:12        Scheduled Meds:  amiodarone  200 mg Oral Daily   amitriptyline  10 mg Oral QHS   docusate sodium  100 mg Oral BID   [START ON 12/31/2020] feeding supplement  237 mL Oral BID BM   fentaNYL       fentaNYL       gabapentin  300 mg Oral QHS   insulin aspart  0-15 Units Subcutaneous TID WC   insulin aspart  0-5 Units Subcutaneous QHS   insulin glargine-yfgn  8 Units Subcutaneous BID   melatonin  5 mg Oral QHS   metoprolol tartrate  12.5 mg Oral BID   [START ON 12/31/2020] multivitamin with minerals  1 tablet Oral Daily   mupirocin ointment  1 application Nasal BID   Continuous Infusions:  methocarbamol (ROBAXIN) IV       LOS: 1 day    Time spent: over 30 min    Fayrene Helper, MD [facility]   To contact the attending provider between 7A-7P or the covering provider during after hours 7P-7A, please log into the web site [email or web address] and access using universal [place] password for that web site. If you do not have the password, please call the hospital operator.  12/30/2020, 3:10 PM

## 2020-12-30 NOTE — Consult Note (Signed)
ORTHOPAEDIC CONSULTATION  REQUESTING PHYSICIAN: Elodia Florence., *  Chief Complaint: Left hip pain  HPI: Jason Hartman is a 85 y.o. male who presents with a left femoral neck fracture after a fall 2 days prior while at a nursing home.  He initially had a fall during a transfer from chair to bed.  There was an x-ray obtained which was read as negative at the time.  It was subsequently over read as an occult fracture and he was subsequently transferred to [facility] for further evaluation.  Per him and his wife he has had a significant decline in the last 3 months.  He was diagnosed with severe UTI and subsequently has been in a nursing home for the last 3 months.  Per report his ambulatory status has significantly decreased during this time.  He is able to walk with a walker.  Denies previous groin pain or issues with her hip prior to this injury  Past Medical History:  Diagnosis Date   CAD (coronary artery disease)    s/p stents   Cellulitis and abscess of leg, except foot    Hypertension    Obstructive sleep apnea (adult) (pediatric)    Restless legs syndrome (RLS)    Thoracic or lumbosacral neuritis or radiculitis, unspecified    Type II or unspecified type diabetes mellitus without mention of complication, not stated as uncontrolled    Unspecified disease of pericardium    Unspecified hereditary and idiopathic peripheral neuropathy    Past Surgical History:  Procedure Laterality Date   ANAL FISSURE REPAIR     pericarditis  2009   Social History   Socioeconomic History   Marital status: Married    Spouse name: [name]   Number of children: 2   Years of education: College   Highest education level: Not on file  Occupational History   Occupation: Marine scientist: [employer]  Tobacco Use   Smoking status: Former    Types: Cigarettes    Quit date: 09/01/1980    Years since quitting: 40.3   Smokeless tobacco: Never  Vaping Use   Vaping Use: Never  used  Substance and Sexual Activity   Alcohol use: Yes    Comment: rare   Drug use: Never   Sexual activity: Not on file  Other Topics Concern   Not on file  Social History Narrative   Patient lives at home with spouse.   Caffeine Use: occasionally   Social Determinants of Health   Financial Resource Strain: Not on file  Food Insecurity: Not on file  Transportation Needs: Not on file  Physical Activity: Not on file  Stress: Not on file  Social Connections: Not on file   Family History  Problem Relation Age of Onset   Diabetes Father    - negative except otherwise stated in the family history section Allergies  Allergen Reactions   Oxycodone Anaphylaxis   Iodinated Diagnostic Agents Hives    alleric to renografin,isovue & omnipaque, hives, requires 13 hr prep//a.[email or web address], Onset Date: [id number]      Iodine Hives and Rash    alleric to renografin,isovue & omnipaque, hives, requires 13 hr prep//a.[email or web address], Onset Date: [id number] allergic to renografin,isovue & omnipaque, hives, requires 13 hr prep//a.[email or web address], Onset Date: [id number]   Linezolid Hives and Rash        Methadone Hcl Other (See Comments)    hallucinations    Promethazine Other (See Comments)    Delirium (pulled out IV), erratic behavior  Mental status change     Isovue [Iopamidol] Hives   Morphine Sulfate Other (See Comments)    Unknown reaction   Omnipaque [Iohexol] Hives    Code: HIVES, Desc: alleric to renografin,isovue & omnipaque, hives, requires 13 hr prep//a.calhoun, Onset Date: [id number]    Quetiapine Other (See Comments)    Unknown reaction   Renografin [Diatrizoate] Hives   Statins Other (See Comments)    Muscle weakness   Zolpidem Other (See Comments)    Erratic behavior/delusions Altered mental status   Atorvastatin Itching and Other (See Comments)    Tired, weakness    Carbidopa-Levodopa Anxiety   Chlorhexidine Gluconate [Chlorhexidine] Hives and Rash   Clindamycin Rash   Colesevelam  Other (See Comments)    tired    Doxazosin Rash   Lovastatin Other (See Comments)    Tired, nervousness    Rosuvastatin Other (See Comments)    Tired, weakness    Prior to Admission medications   Medication Sig Start Date End Date Taking? Authorizing Provider  acetaminophen (TYLENOL) 500 MG tablet Take 1,000 mg by mouth every 8 (eight) hours.   Yes [provider]  Acidophilus Lactobacillus CAPS Take 1 capsule by mouth every evening.   Yes [provider]  amiodarone (PACERONE) 200 MG tablet Take 1 tablet (200 mg total) by mouth daily. 09/26/20  Yes Mercy Riding, MD  amitriptyline (ELAVIL) 10 MG tablet Take 1 tablet (10 mg total) by mouth at bedtime. 10/29/20  Yes Nita Sells, MD  citalopram (CELEXA) 10 MG tablet Take 10 mg by mouth every morning. Take with a 20 mg tablet for a total dose of 30 mg every morning   Yes [provider]  citalopram (CELEXA) 20 MG tablet Take 20 mg by mouth every morning. Take with a 10 mg tablet for a total dose of 30 mg every morning   Yes [provider]  clonazePAM (KLONOPIN) 0.5 MG tablet Take 0.5 mg by mouth 2 (two) times daily.   Yes [provider]  famotidine (PEPCID) 20 MG tablet Take 20 mg by mouth every morning.   Yes [provider]  ferrous sulfate 325 (65 FE) MG EC tablet Take 325 mg by mouth every morning. 05/28/18  Yes [provider]  gabapentin (NEURONTIN) 300 MG capsule Take 1 capsule (300 mg total) by mouth at bedtime. 09/25/20  Yes Mercy Riding, MD  LANTUS SOLOSTAR 100 UNIT/ML Solostar Pen Inject 8 Units into the skin 2 (two) times daily. 09/25/20  Yes Gonfa, Charlesetta Ivory, MD  melatonin 5 MG TABS Take 5 mg by mouth at bedtime.   Yes [provider]  metFORMIN (GLUCOPHAGE-XR) 500 MG 24 hr tablet Take 500 mg by mouth daily with breakfast. 01/17/20  Yes [provider]  metoprolol tartrate (LOPRESSOR) 25 MG tablet Take 0.5 tablets (12.5 mg total) by mouth 2 (two)  times daily. 09/25/20  Yes Mercy Riding, MD  [facility] powder Apply 1 application topically in the morning and at bedtime. To rash on back and groin and sacrum 10/18/20  Yes [provider]  polyethylene glycol powder (GLYCOLAX/MIRALAX) 17 GM/SCOOP powder Take 17 g by mouth every morning. Mix in 8 oz water and drink   Yes [provider]  Rotigotine (NEUPRO) 8 MG/24HR PT24 Place 1 patch onto the skin at bedtime. For Parkinson's   Yes [provider]  Semaglutide,0.25 or 0.5MG /DOS, (OZEMPIC, 0.25 OR 0.5 MG/DOSE,) 2 MG/1.5ML SOPN Inject 0.5 mg into the skin every Tuesday.  Yes [provider]  senna-docusate (SENOKOT-S) 8.6-50 MG tablet Take 1 tablet by mouth 2 (two) times daily as needed for moderate constipation. 09/25/20  Yes Mercy Riding, MD  thiamine 100 MG tablet Take 1 tablet (100 mg total) by mouth daily. 09/26/20  Yes Mercy Riding, MD  traMADol (ULTRAM) 50 MG tablet Take 1 tablet (50 mg total) by mouth daily. Patient taking differently: Take 75 mg by mouth 2 (two) times daily. 10/29/20  Yes Nita Sells, MD  apixaban (ELIQUIS) 5 MG TABS tablet Take 1 tablet (5 mg total) by mouth 2 (two) times daily. Patient not taking: Reported on 12/29/2020 09/29/20 10/29/20  Mercy Riding, MD  DULoxetine (CYMBALTA) 30 MG capsule Take 1 capsule (30 mg total) by mouth daily. Patient not taking: Reported on 12/29/2020 10/29/20   Nita Sells, MD   DG Pelvis 1-2 Views  Result Date: 12/29/2020 CLINICAL DATA:  Left hip pain after fall. EXAM: PELVIS - 1-2 VIEW COMPARISON:  January 11, 2015. FINDINGS: There is no evidence of pelvic fracture or diastasis. No pelvic bone lesions are seen. IMPRESSION: Negative. Electronically Signed   By: Marijo Conception M.D.   On: 12/29/2020 14:17   DG Elbow Complete Left  Result Date: 12/29/2020 CLINICAL DATA:  Left elbow pain after fall. EXAM: LEFT ELBOW - COMPLETE 3+ VIEW COMPARISON:  None. FINDINGS: There is no evidence of fracture,  dislocation, or joint effusion. There is no evidence of arthropathy or other focal bone abnormality. Soft tissues are unremarkable. IMPRESSION: Negative. Electronically Signed   By: Marijo Conception M.D.   On: 12/29/2020 14:15   DG Hand 2 View Left  Result Date: 12/29/2020 CLINICAL DATA:  Left hand pain after fall. EXAM: LEFT HAND - 2 VIEW COMPARISON:  None. FINDINGS: There is no evidence of fracture or dislocation. Severe degenerative changes seen involving the first carpometacarpal joint. Soft tissues are unremarkable. IMPRESSION: Severe osteoarthritis involving the first carpometacarpal joint. No acute abnormality seen. Electronically Signed   By: Marijo Conception M.D.   On: 12/29/2020 14:13   CT Hip Left Wo Contrast  Result Date: 12/29/2020 CLINICAL DATA:  Left hip pain after fall. EXAM: CT OF THE LEFT HIP WITHOUT CONTRAST TECHNIQUE: Multidetector CT imaging of the left hip was performed according to the standard protocol. Multiplanar CT image reconstructions were also generated. COMPARISON:  Pelvis and left femur x-rays from same day. FINDINGS: Bones/Joint/Cartilage Acute mildly impacted subcapital left femoral neck fracture. No dislocation. Joint spaces are preserved. No joint effusion. Ligaments Ligaments are suboptimally evaluated by CT. Muscles and Tendons Grossly intact. Soft tissue No fluid collection or hematoma.  No soft tissue mass. IMPRESSION: 1. Acute mildly impacted subcapital left femoral neck fracture. Electronically Signed   By: Titus Dubin M.D.   On: 12/29/2020 15:29   DG Shoulder Left  Result Date: 12/29/2020 CLINICAL DATA:  Left shoulder pain after fall. EXAM: LEFT SHOULDER - 2+ VIEW COMPARISON:  None. FINDINGS: There is no evidence of fracture or dislocation. Moderate degenerative changes seen involving the left glenohumeral and acromioclavicular joints. Soft tissues are unremarkable. IMPRESSION: Moderate degenerative joint disease is seen involving the left glenohumeral and  acromioclavicular joints. No acute abnormality is noted. Electronically Signed   By: Marijo Conception M.D.   On: 12/29/2020 14:16   DG Femur Min 2 Views Left  Result Date: 12/29/2020 CLINICAL DATA:  LEFT hip pain after fall EXAM: LEFT FEMUR 2 VIEWS COMPARISON:  None. FINDINGS: There is varus angulation of the  LEFT femoral head in relation to the femoral neck. There is a cortical step-off along the inferior medial margin of the femoral neck. No fracture of the femoral shaft. The knee joint appears normal on two views. IMPRESSION: Concern for LEFT femoral neck fracture with varus angulation. Consider dedicated exam of the LEFT hip/pelvis or CT LEFT hip. Electronically Signed   By: Suzy Bouchard M.D.   On: 12/29/2020 14:12     Positive ROS: All other systems have been reviewed and were otherwise negative with the exception of those mentioned in the HPI and as above.  Physical Exam: General: No acute distress Cardiovascular: No pedal edema Respiratory: No cyanosis, no use of accessory musculature GI: No organomegaly, abdomen is soft and non-tender Skin: No lesions in the area of chief complaint Neurologic: Sensation intact distally Psychiatric: Patient is at baseline mood and affect Lymphatic: No axillary or cervical lymphadenopathy  MUSCULOSKELETAL:  Left hip has groin tenderness and pain with logroll.  Sensation is intact to light touch in all distributions of the foot.  Is able to dorsal and plantar flex all of his toes.  2+ DP pulse  Independent Imaging Review: Left subcapital femoral neck fracture.  There is significant valgus impaction with extension into the femoral head particularly inferiorly.  There is displacement and collapse  CT left hip: Displacement of the left femoral neck fracture  Assessment: 85 year old male with a left femoral neck fracture after a fall from standing.  I reviewed both the x-rays and the CTs.  At this time I recommended left hip hemiarthroplasty to  promote early mobilization and pain control.  This will be particularly helpful in hopes that he is able to rehabilitate from his previous deconditioning.  I discussed that there is a slight amount of displacement profile particularly inferiorly along the neck with extension into the head.  As such I believe a hip hemiarthroplasty would give him the best option of having a single surgery that would produce a solid construct in which she can weight-bear  Plan: Plan for left hip hemiarthroplasty   After a lengthy discussion of treatment options, including risks, benefits, alternatives, complications of surgical and nonsurgical conservative options, the patient elected surgical repair.   The patient  is aware of the material risks  and complications including, but not limited to injury to adjacent structures, neurovascular injury, infection, numbness, bleeding, implant failure, thermal burns, stiffness, persistent pain, failure to heal, disease transmission from allograft, need for further surgery, dislocation, anesthetic risks, blood clots, risks of death,and others. The probabilities of surgical success and failure discussed with patient given their particular co-morbidities.The time and nature of expected rehabilitation and recovery was discussed.The patient's questions were all answered preoperatively.  No barriers to understanding were noted. I explained the natural history of the disease process and Rx rationale.  I explained to the patient what I considered to be reasonable expectations given their personal situation.  The final treatment plan was arrived at through a shared patient decision making process model.   Thank you for the consult and the opportunity to see Mr. Kaheem Halleck, MD [facility] 7:37 AM

## 2020-12-30 NOTE — Progress Notes (Signed)
Initial Nutrition Assessment  DOCUMENTATION CODES:   Not applicable  INTERVENTION:   -Once diet is advanced:  -Ensure Enlive po BID, each supplement provides 350 kcal and 20 grams of protein  -MVI with minerals daily  NUTRITION DIAGNOSIS:   Increased nutrient needs related to post-op healing as evidenced by estimated needs.  GOAL:   Patient will meet greater than or equal to 90% of their needs  MONITOR:   PO intake, Supplement acceptance, Diet advancement, Labs, Weight trends, Skin, I & O's  REASON FOR ASSESSMENT:   Malnutrition Screening Tool, Consult Hip fracture protocol  ASSESSMENT:   Jason Hartman is a 85 y.o. male with medical history significant of HTN; OSA; RLS; and DM presenting with fall.  He is in [facility] for deconditioning after a severe UTI.  He was attempting to transfer from wheelchair to bed without assistance when he fell and landed on his hip.  This happened 2-3 days ago and he thought it would get better but when he still couldn't bear weight he decided to come in.  Pt admitted with lt hip fracture s/p fall.   Per orthopedics notes, plan for lt hip hemiarthroplasty today. Pt is currently NPO for procedure.    Pt unavailable at time of visit. RD unable to obtain further nutrition-related history or complete nutrition-focused physical exam at this time.    Reviewed wt hx; pt has experienced a 15% wt loss over the past month, which is significant for time frame.   Pt with at risk for malnutrition, however, unable to identify at this time. He has increased nutritional needs for post-operative healing and would benefit from addition of oral nutrition supplements.   Medications reviewed and include colace and melatonin.   Lab Results  Component Value Date   HGBA1C 7.2 (H) 10/25/2020   PTA DM medications are 0.5 mg semaglutide weekly, 8 units lantus solostar BID, and 500 mg metformin daily. Per ADA's Standards of Medical Care for Diabetes, glycemic  targets for elderly patients who are otherwise healthy (few medical impairments) and cognitively intact should be less stringent (Hgb A1c <7.5).    Labs reviewed: CBGS: 123-128 (inpatient orders for glycemic control are 0-15 units insulin aspart TID with meals, 0-5 units insulin aspart daily at bedtime, and 8 units insulin glargine-yfgn BID).    Diet Order:   Diet Order             Diet heart healthy/carb modified Room service appropriate? Yes; Fluid consistency: Thin  Diet effective now                   EDUCATION NEEDS:   No education needs have been identified at this time  Skin:  Skin Assessment: Reviewed RN Assessment  Last BM:  Unknown  Height:   Ht Readings from Last 1 Encounters:  12/29/20 5\' 10"  (1.778 m)    Weight:   Wt Readings from Last 1 Encounters:  12/29/20 68 kg    Ideal Body Weight:  75.5 kg  BMI:  Body mass index is 21.52 kg/m.  Estimated Nutritional Needs:   Kcal:  2050-2250  Protein:  100-115 grams  Fluid:  > 2 L    Loistine Chance, RD, LDN, [place] Registered Dietitian II Certified Diabetes Care and Education Specialist Please refer to [facility] for RD and/or RD on-call/weekend/after hours pager

## 2020-12-30 NOTE — Plan of Care (Signed)
  Problem: Education: Goal: Knowledge of General Education information will improve Description: Including pain rating scale, medication(s)/side effects and non-pharmacologic comfort measures 12/30/2020 0115 by Claire Shown, RN Outcome: Progressing 12/30/2020 0056 by Claire Shown, RN Outcome: Progressing   Problem: Clinical Measurements: Goal: Will remain free from infection Outcome: Progressing   Problem: Activity: Goal: Risk for activity intolerance will decrease Outcome: Progressing   Problem: Pain Managment: Goal: General experience of comfort will improve 12/30/2020 0115 by Claire Shown, RN Outcome: Progressing 12/30/2020 0056 by Claire Shown, RN Outcome: Progressing   Problem: Skin Integrity: Goal: Risk for impaired skin integrity will decrease Outcome: Progressing   Problem: Safety: Goal: Ability to remain free from injury will improve 12/30/2020 0115 by Claire Shown, RN Outcome: Progressing 12/30/2020 0056 by Claire Shown, RN Outcome: Progressing

## 2020-12-30 NOTE — Anesthesia Preprocedure Evaluation (Addendum)
Anesthesia Evaluation  Patient identified by MRN, date of birth, ID band Patient awake    Reviewed: Allergy & Precautions, NPO status , Patient's Chart, lab work & pertinent test results, reviewed documented beta blocker date and time   History of Anesthesia Complications Negative for: history of anesthetic complications  Airway Mallampati: II  TM Distance: >3 FB Neck ROM: Full    Dental  (+) Dental Advisory Given   Pulmonary sleep apnea (does not use CPAP) , former smoker,    breath sounds clear to auscultation       Cardiovascular hypertension, Pt. on medications and Pt. on home beta blockers (-) angina+ CAD and + Cardiac Stents  + dysrhythmias Atrial Fibrillation  Rhythm:Irregular Rate:Normal  09/2020 ECHO: EF 55-60%. LV normal function, no regional wall motion abnormalities, mild LVH, Grade I DD, mild MR, aortic sclerosis without stenosis   Neuro/Psych Anxiety negative neurological ROS     GI/Hepatic Neg liver ROS, PUD, GERD  Medicated and Controlled,  Endo/Other  diabetes (glu 123), Insulin Dependent, Oral Hypoglycemic Agents  Renal/GU Renal InsufficiencyRenal disease     Musculoskeletal  (+) Arthritis , Osteoarthritis,    Abdominal   Peds  Hematology  (+) Blood dyscrasia (Hb 11.5), ,   Anesthesia Other Findings   Reproductive/Obstetrics                            Anesthesia Physical Anesthesia Plan  ASA: 3  Anesthesia Plan: Spinal   Post-op Pain Management:    Induction:   PONV Risk Score and Plan: 2 and Ondansetron and Dexamethasone  Airway Management Planned: Natural Airway and Simple Face Mask  Additional Equipment: None  Intra-op Plan:   Post-operative Plan:   Informed Consent: I have reviewed the patients History and Physical, chart, labs and discussed the procedure including the risks, benefits and alternatives for the proposed anesthesia with the patient or  authorized representative who has indicated his/her understanding and acceptance.     Dental advisory given  Plan Discussed with: CRNA and Surgeon  Anesthesia Plan Comments: (Pt prefers SAB)      Anesthesia Quick Evaluation

## 2020-12-30 NOTE — Transfer of Care (Signed)
Immediate Anesthesia Transfer of Care Note  Patient: Jason Hartman  Procedure(s) Performed: ANTERIOR APPROACH HIP ARTHROPLASTY (Left)  Patient Location: PACU  Anesthesia Type:MAC and Spinal  Level of Consciousness: drowsy  Airway & Oxygen Therapy: Patient Spontanous Breathing and Patient connected to face mask oxygen  Post-op Assessment: Report given to RN and Post -op Vital signs reviewed and stable  Post vital signs: Reviewed and stable  Last Vitals:  Vitals Value Taken Time  BP 112/61 12/30/20 1228  Temp    Pulse 65 12/30/20 1232  Resp 14 12/30/20 1232  SpO2 100 % 12/30/20 1232  Vitals shown include unvalidated device data.  Last Pain:  Vitals:   12/30/20 0911  TempSrc: Oral  PainSc: 0-No pain      Patients Stated Pain Goal: 3 (62/[id number])  Complications: No notable events documented.

## 2020-12-30 NOTE — Anesthesia Procedure Notes (Signed)
Procedure Name: MAC Date/Time: 12/30/2020 10:10 AM Performed by: Reece Agar, CRNA Pre-anesthesia Checklist: Patient identified, Emergency Drugs available, Suction available and Patient being monitored Patient Re-evaluated:Patient Re-evaluated prior to induction Oxygen Delivery Method: Simple face mask

## 2020-12-30 NOTE — H&P (View-Only) (Signed)
ORTHOPAEDIC CONSULTATION  REQUESTING PHYSICIAN: Elodia Florence., *  Chief Complaint: Left hip pain  HPI: Jason Hartman is a 85 y.o. male who presents with a left femoral neck fracture after a fall 2 days prior while at a nursing home.  He initially had a fall during a transfer from chair to bed.  There was an x-ray obtained which was read as negative at the time.  It was subsequently over read as an occult fracture and he was subsequently transferred to [facility] for further evaluation.  Per him and his wife he has had a significant decline in the last 3 months.  He was diagnosed with severe UTI and subsequently has been in a nursing home for the last 3 months.  Per report his ambulatory status has significantly decreased during this time.  He is able to walk with a walker.  Denies previous groin pain or issues with her hip prior to this injury  Past Medical History:  Diagnosis Date   CAD (coronary artery disease)    s/p stents   Cellulitis and abscess of leg, except foot    Hypertension    Obstructive sleep apnea (adult) (pediatric)    Restless legs syndrome (RLS)    Thoracic or lumbosacral neuritis or radiculitis, unspecified    Type II or unspecified type diabetes mellitus without mention of complication, not stated as uncontrolled    Unspecified disease of pericardium    Unspecified hereditary and idiopathic peripheral neuropathy    Past Surgical History:  Procedure Laterality Date   ANAL FISSURE REPAIR     pericarditis  2009   Social History   Socioeconomic History   Marital status: Married    Spouse name: Jason Hartman   Number of children: 2   Years of education: College   Highest education level: Not on file  Occupational History   Occupation: Marine scientist: [employer]  Tobacco Use   Smoking status: Former    Types: Cigarettes    Quit date: 09/01/1980    Years since quitting: 40.3   Smokeless tobacco: Never  Vaping Use   Vaping Use: Never  used  Substance and Sexual Activity   Alcohol use: Yes    Comment: rare   Drug use: Never   Sexual activity: Not on file  Other Topics Concern   Not on file  Social History Narrative   Patient lives at home with spouse.   Caffeine Use: occasionally   Social Determinants of Health   Financial Resource Strain: Not on file  Food Insecurity: Not on file  Transportation Needs: Not on file  Physical Activity: Not on file  Stress: Not on file  Social Connections: Not on file   Family History  Problem Relation Age of Onset   Diabetes Father    - negative except otherwise stated in the family history section Allergies  Allergen Reactions   Oxycodone Anaphylaxis   Iodinated Diagnostic Agents Hives    alleric to renografin,isovue & omnipaque, hives, requires 13 hr prep//a.[email or web address], Onset Date: [id number]      Iodine Hives and Rash    alleric to renografin,isovue & omnipaque, hives, requires 13 hr prep//a.[email or web address], Onset Date: [id number] allergic to renografin,isovue & omnipaque, hives, requires 13 hr prep//a.[email or web address], Onset Date: [id number]   Linezolid Hives and Rash        Methadone Hcl Other (See Comments)    hallucinations    Promethazine Other (See Comments)    Delirium (pulled out IV), erratic behavior  Mental status change     Isovue [Iopamidol] Hives   Morphine Sulfate Other (See Comments)    Unknown reaction   Omnipaque [Iohexol] Hives    Code: HIVES, Desc: alleric to renografin,isovue & omnipaque, hives, requires 13 hr prep//a.[email or web address], Onset Date: 09381829    Quetiapine Other (See Comments)    Unknown reaction   Renografin [Diatrizoate] Hives   Statins Other (See Comments)    Muscle weakness   Zolpidem Other (See Comments)    Erratic behavior/delusions Altered mental status   Atorvastatin Itching and Other (See Comments)    Tired, weakness    Carbidopa-Levodopa Anxiety   Chlorhexidine Gluconate [Chlorhexidine] Hives and Rash   Clindamycin Rash   Colesevelam  Other (See Comments)    tired    Doxazosin Rash   Lovastatin Other (See Comments)    Tired, nervousness    Rosuvastatin Other (See Comments)    Tired, weakness    Prior to Admission medications   Medication Sig Start Date End Date Taking? Authorizing Provider  acetaminophen (TYLENOL) 500 MG tablet Take 1,000 mg by mouth every 8 (eight) hours.   Yes [provider]  Acidophilus Lactobacillus CAPS Take 1 capsule by mouth every evening.   Yes [provider]  amiodarone (PACERONE) 200 MG tablet Take 1 tablet (200 mg total) by mouth daily. 09/26/20  Yes Mercy Riding, MD  amitriptyline (ELAVIL) 10 MG tablet Take 1 tablet (10 mg total) by mouth at bedtime. 10/29/20  Yes Nita Sells, MD  citalopram (CELEXA) 10 MG tablet Take 10 mg by mouth every morning. Take with a 20 mg tablet for a total dose of 30 mg every morning   Yes [provider]  citalopram (CELEXA) 20 MG tablet Take 20 mg by mouth every morning. Take with a 10 mg tablet for a total dose of 30 mg every morning   Yes [provider]  clonazePAM (KLONOPIN) 0.5 MG tablet Take 0.5 mg by mouth 2 (two) times daily.   Yes [provider]  famotidine (PEPCID) 20 MG tablet Take 20 mg by mouth every morning.   Yes [provider]  ferrous sulfate 325 (65 FE) MG EC tablet Take 325 mg by mouth every morning. 05/28/18  Yes [provider]  gabapentin (NEURONTIN) 300 MG capsule Take 1 capsule (300 mg total) by mouth at bedtime. 09/25/20  Yes Mercy Riding, MD  LANTUS SOLOSTAR 100 UNIT/ML Solostar Pen Inject 8 Units into the skin 2 (two) times daily. 09/25/20  Yes Gonfa, Charlesetta Ivory, MD  melatonin 5 MG TABS Take 5 mg by mouth at bedtime.   Yes [provider]  metFORMIN (GLUCOPHAGE-XR) 500 MG 24 hr tablet Take 500 mg by mouth daily with breakfast. 01/17/20  Yes [provider]  metoprolol tartrate (LOPRESSOR) 25 MG tablet Take 0.5 tablets (12.5 mg total) by mouth 2 (two)  times daily. 09/25/20  Yes Mercy Riding, MD  [employer] powder Apply 1 application topically in the morning and at bedtime. To rash on back and groin and sacrum 10/18/20  Yes [provider]  polyethylene glycol powder (GLYCOLAX/MIRALAX) 17 GM/SCOOP powder Take 17 g by mouth every morning. Mix in 8 oz water and drink   Yes [provider]  Rotigotine (NEUPRO) 8 MG/24HR PT24 Place 1 patch onto the skin at bedtime. For Parkinson's   Yes [provider]  Semaglutide,0.25 or 0.5MG /DOS, (OZEMPIC, 0.25 OR 0.5 MG/DOSE,) 2 MG/1.5ML SOPN Inject 0.5 mg into the skin every Tuesday.  Yes [provider]  senna-docusate (SENOKOT-S) 8.6-50 MG tablet Take 1 tablet by mouth 2 (two) times daily as needed for moderate constipation. 09/25/20  Yes Mercy Riding, MD  thiamine 100 MG tablet Take 1 tablet (100 mg total) by mouth daily. 09/26/20  Yes Mercy Riding, MD  traMADol (ULTRAM) 50 MG tablet Take 1 tablet (50 mg total) by mouth daily. Patient taking differently: Take 75 mg by mouth 2 (two) times daily. 10/29/20  Yes Nita Sells, MD  apixaban (ELIQUIS) 5 MG TABS tablet Take 1 tablet (5 mg total) by mouth 2 (two) times daily. Patient not taking: Reported on 12/29/2020 09/29/20 10/29/20  Mercy Riding, MD  DULoxetine (CYMBALTA) 30 MG capsule Take 1 capsule (30 mg total) by mouth daily. Patient not taking: Reported on 12/29/2020 10/29/20   Nita Sells, MD   DG Pelvis 1-2 Views  Result Date: 12/29/2020 CLINICAL DATA:  Left hip pain after fall. EXAM: PELVIS - 1-2 VIEW COMPARISON:  January 11, 2015. FINDINGS: There is no evidence of pelvic fracture or diastasis. No pelvic bone lesions are seen. IMPRESSION: Negative. Electronically Signed   By: Marijo Conception M.D.   On: 12/29/2020 14:17   DG Elbow Complete Left  Result Date: 12/29/2020 CLINICAL DATA:  Left elbow pain after fall. EXAM: LEFT ELBOW - COMPLETE 3+ VIEW COMPARISON:  None. FINDINGS: There is no evidence of fracture,  dislocation, or joint effusion. There is no evidence of arthropathy or other focal bone abnormality. Soft tissues are unremarkable. IMPRESSION: Negative. Electronically Signed   By: Marijo Conception M.D.   On: 12/29/2020 14:15   DG Hand 2 View Left  Result Date: 12/29/2020 CLINICAL DATA:  Left hand pain after fall. EXAM: LEFT HAND - 2 VIEW COMPARISON:  None. FINDINGS: There is no evidence of fracture or dislocation. Severe degenerative changes seen involving the first carpometacarpal joint. Soft tissues are unremarkable. IMPRESSION: Severe osteoarthritis involving the first carpometacarpal joint. No acute abnormality seen. Electronically Signed   By: Marijo Conception M.D.   On: 12/29/2020 14:13   CT Hip Left Wo Contrast  Result Date: 12/29/2020 CLINICAL DATA:  Left hip pain after fall. EXAM: CT OF THE LEFT HIP WITHOUT CONTRAST TECHNIQUE: Multidetector CT imaging of the left hip was performed according to the standard protocol. Multiplanar CT image reconstructions were also generated. COMPARISON:  Pelvis and left femur x-rays from same day. FINDINGS: Bones/Joint/Cartilage Acute mildly impacted subcapital left femoral neck fracture. No dislocation. Joint spaces are preserved. No joint effusion. Ligaments Ligaments are suboptimally evaluated by CT. Muscles and Tendons Grossly intact. Soft tissue No fluid collection or hematoma.  No soft tissue mass. IMPRESSION: 1. Acute mildly impacted subcapital left femoral neck fracture. Electronically Signed   By: Titus Dubin M.D.   On: 12/29/2020 15:29   DG Shoulder Left  Result Date: 12/29/2020 CLINICAL DATA:  Left shoulder pain after fall. EXAM: LEFT SHOULDER - 2+ VIEW COMPARISON:  None. FINDINGS: There is no evidence of fracture or dislocation. Moderate degenerative changes seen involving the left glenohumeral and acromioclavicular joints. Soft tissues are unremarkable. IMPRESSION: Moderate degenerative joint disease is seen involving the left glenohumeral and  acromioclavicular joints. No acute abnormality is noted. Electronically Signed   By: Marijo Conception M.D.   On: 12/29/2020 14:16   DG Femur Min 2 Views Left  Result Date: 12/29/2020 CLINICAL DATA:  LEFT hip pain after fall EXAM: LEFT FEMUR 2 VIEWS COMPARISON:  None. FINDINGS: There is varus angulation of the  LEFT femoral head in relation to the femoral neck. There is a cortical step-off along the inferior medial margin of the femoral neck. No fracture of the femoral shaft. The knee joint appears normal on two views. IMPRESSION: Concern for LEFT femoral neck fracture with varus angulation. Consider dedicated exam of the LEFT hip/pelvis or CT LEFT hip. Electronically Signed   By: Suzy Bouchard M.D.   On: 12/29/2020 14:12     Positive ROS: All other systems have been reviewed and were otherwise negative with the exception of those mentioned in the HPI and as above.  Physical Exam: General: No acute distress Cardiovascular: No pedal edema Respiratory: No cyanosis, no use of accessory musculature GI: No organomegaly, abdomen is soft and non-tender Skin: No lesions in the area of chief complaint Neurologic: Sensation intact distally Psychiatric: Patient is at baseline mood and affect Lymphatic: No axillary or cervical lymphadenopathy  MUSCULOSKELETAL:  Left hip has groin tenderness and pain with logroll.  Sensation is intact to light touch in all distributions of the foot.  Is able to dorsal and plantar flex all of his toes.  2+ DP pulse  Independent Imaging Review: Left subcapital femoral neck fracture.  There is significant valgus impaction with extension into the femoral head particularly inferiorly.  There is displacement and collapse  CT left hip: Displacement of the left femoral neck fracture  Assessment: 85 year old male with a left femoral neck fracture after a fall from standing.  I reviewed both the x-rays and the CTs.  At this time I recommended left hip hemiarthroplasty to  promote early mobilization and pain control.  This will be particularly helpful in hopes that he is able to rehabilitate from his previous deconditioning.  I discussed that there is a slight amount of displacement profile particularly inferiorly along the neck with extension into the head.  As such I believe a hip hemiarthroplasty would give him the best option of having a single surgery that would produce a solid construct in which she can weight-bear  Plan: Plan for left hip hemiarthroplasty   After a lengthy discussion of treatment options, including risks, benefits, alternatives, complications of surgical and nonsurgical conservative options, the patient elected surgical repair.   The patient  is aware of the material risks  and complications including, but not limited to injury to adjacent structures, neurovascular injury, infection, numbness, bleeding, implant failure, thermal burns, stiffness, persistent pain, failure to heal, disease transmission from allograft, need for further surgery, dislocation, anesthetic risks, blood clots, risks of death,and others. The probabilities of surgical success and failure discussed with patient given their particular co-morbidities.The time and nature of expected rehabilitation and recovery was discussed.The patient's questions were all answered preoperatively.  No barriers to understanding were noted. I explained the natural history of the disease process and Rx rationale.  I explained to the patient what I considered to be reasonable expectations given their personal situation.  The final treatment plan was arrived at through a shared patient decision making process model.   Thank you for the consult and the opportunity to see Mr. Dustan Hyams, MD [facility] 7:37 AM

## 2020-12-30 NOTE — Anesthesia Postprocedure Evaluation (Signed)
Anesthesia Post Note  Patient: CID AGENA  Procedure(s) Performed: ANTERIOR APPROACH HIP ARTHROPLASTY (Left)     Patient location during evaluation: PACU Anesthesia Type: Spinal Level of consciousness: awake and alert, patient cooperative and oriented Pain management: pain level controlled (pain improving) Respiratory status: nonlabored ventilation, spontaneous breathing and respiratory function stable Cardiovascular status: blood pressure returned to baseline and stable Postop Assessment: no apparent nausea or vomiting, patient able to bend at knees and spinal receding Anesthetic complications: no   No notable events documented.  Last Vitals:  Vitals:   12/30/20 1345 12/30/20 1405  BP: 106/60 121/69  Pulse: 71 78  Resp: 15 18  Temp: 36.4 C   SpO2: 100% 100%    Last Pain:  Vitals:   12/30/20 1300  TempSrc:   PainSc: 7                  Kadon Andrus,E. Yuliza Cara

## 2020-12-30 NOTE — Progress Notes (Signed)
Patient refused CPAP. States he doesn't use one at home. Unit not in room at this time.

## 2020-12-31 ENCOUNTER — Inpatient Hospital Stay (HOSPITAL_COMMUNITY): Payer: Medicare Other

## 2020-12-31 DIAGNOSIS — S72002A Fracture of unspecified part of neck of left femur, initial encounter for closed fracture: Secondary | ICD-10-CM | POA: Diagnosis not present

## 2020-12-31 LAB — COMPREHENSIVE METABOLIC PANEL
ALT: 10 U/L (ref 0–44)
AST: 20 U/L (ref 15–41)
Albumin: 2.6 g/dL — ABNORMAL LOW (ref 3.5–5.0)
Alkaline Phosphatase: 68 U/L (ref 38–126)
Anion gap: 11 (ref 5–15)
BUN: 20 mg/dL (ref 8–23)
CO2: 23 mmol/L (ref 22–32)
Calcium: 8.7 mg/dL — ABNORMAL LOW (ref 8.9–10.3)
Chloride: 98 mmol/L (ref 98–111)
Creatinine, Ser: 1.22 mg/dL (ref 0.61–1.24)
GFR, Estimated: 59 mL/min — ABNORMAL LOW (ref >60)
Glucose, Bld: 183 mg/dL — ABNORMAL HIGH (ref 70–99)
Potassium: 4.4 mmol/L (ref 3.5–5.1)
Sodium: 132 mmol/L — ABNORMAL LOW (ref 135–145)
Total Bilirubin: 1.2 mg/dL (ref 0.3–1.2)
Total Protein: 6.4 g/dL — ABNORMAL LOW (ref 6.5–8.1)

## 2020-12-31 LAB — CBC WITH DIFFERENTIAL/PLATELET
Abs Immature Granulocytes: 0.1 10*3/uL — ABNORMAL HIGH (ref 0.00–0.07)
Basophils Absolute: 0 10*3/uL (ref 0.0–0.1)
Basophils Relative: 0 %
Eosinophils Absolute: 0 10*3/uL (ref 0.0–0.5)
Eosinophils Relative: 0 %
HCT: 31.3 % — ABNORMAL LOW (ref 39.0–52.0)
Hemoglobin: 10.4 g/dL — ABNORMAL LOW (ref 13.0–17.0)
Immature Granulocytes: 1 %
Lymphocytes Relative: 6 %
Lymphs Abs: 1.1 10*3/uL (ref 0.7–4.0)
MCH: 29.5 pg (ref 26.0–34.0)
MCHC: 33.2 g/dL (ref 30.0–36.0)
MCV: 88.7 fL (ref 80.0–100.0)
Monocytes Absolute: 1.3 10*3/uL — ABNORMAL HIGH (ref 0.1–1.0)
Monocytes Relative: 7 %
Neutro Abs: 15.9 10*3/uL — ABNORMAL HIGH (ref 1.7–7.7)
Neutrophils Relative %: 86 %
Platelets: 233 10*3/uL (ref 150–400)
RBC: 3.53 MIL/uL — ABNORMAL LOW (ref 4.22–5.81)
RDW: 14.1 % (ref 11.5–15.5)
WBC: 18.4 10*3/uL — ABNORMAL HIGH (ref 4.0–10.5)
nRBC: 0 % (ref 0.0–0.2)

## 2020-12-31 LAB — MAGNESIUM: Magnesium: 1.5 mg/dL — ABNORMAL LOW (ref 1.7–2.4)

## 2020-12-31 LAB — GLUCOSE, CAPILLARY
Glucose-Capillary: 285 mg/dL — ABNORMAL HIGH (ref 70–99)
Glucose-Capillary: 201 mg/dL — ABNORMAL HIGH (ref 70–99)
Glucose-Capillary: 175 mg/dL — ABNORMAL HIGH (ref 70–99)
Glucose-Capillary: 165 mg/dL — ABNORMAL HIGH (ref 70–99)

## 2020-12-31 LAB — PHOSPHORUS: Phosphorus: 3.5 mg/dL (ref 2.5–4.6)

## 2020-12-31 IMAGING — US US SCROTUM W/ DOPPLER COMPLETE
1 series · 13 of 25 positions shown · non-contrast
Comparison: None.

CLINICAL DATA: Scrotal pain.

EXAM:
SCROTAL ULTRASOUND
DOPPLER ULTRASOUND OF THE TESTICLES
TECHNIQUE: Complete ultrasound examination of the testicles, epididymis, and
other scrotal structures was performed. Color and spectral Doppler
ultrasound were also utilized to evaluate blood flow to the
testicles.

[Series 1: us scrotum w/doppler · 13 of 53 slices shown]
[im 1/53]
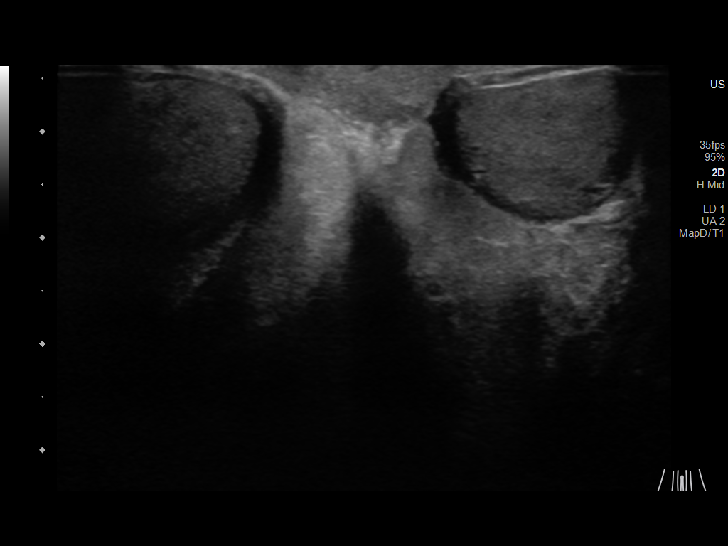
[im 5/53]
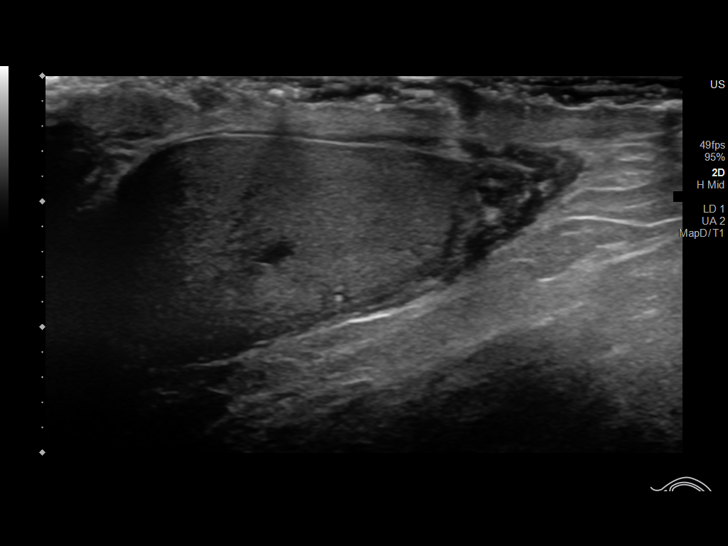
[im 9/53]
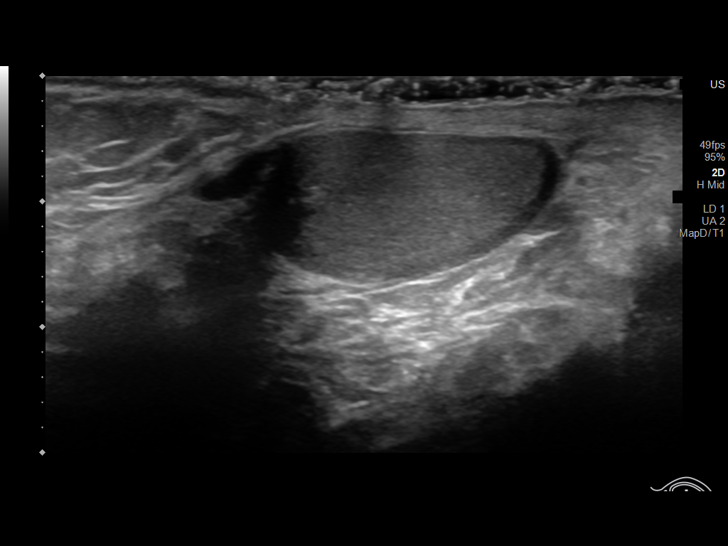
[im 14/53]
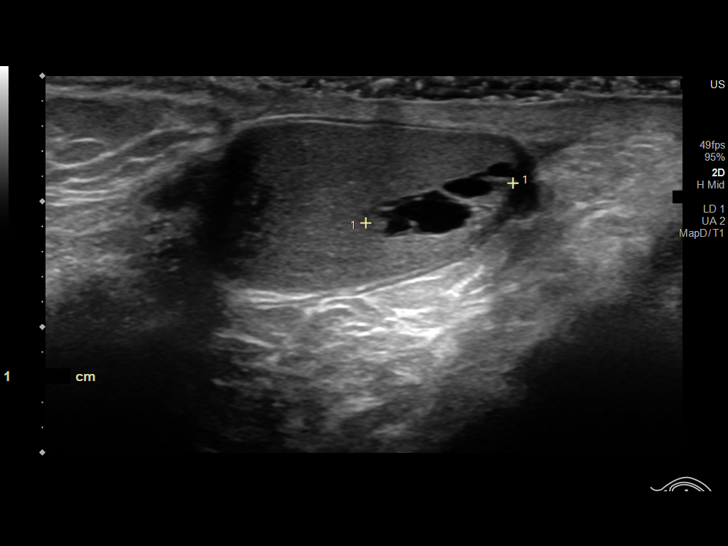
[im 18/53]
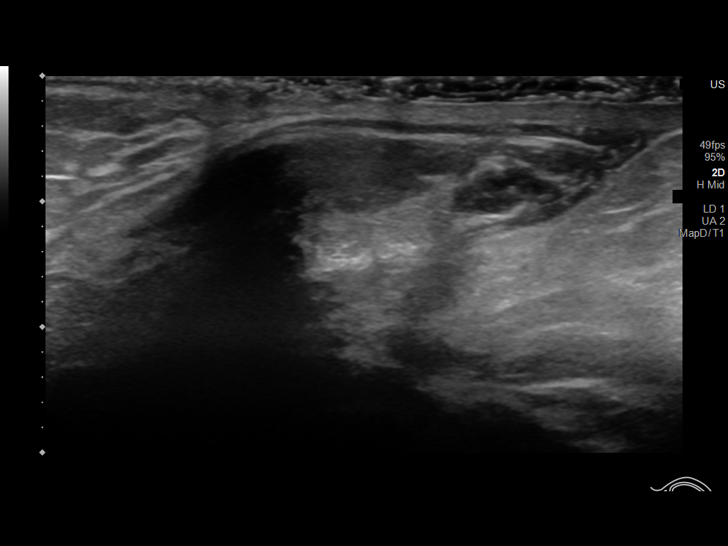
[im 22/53]
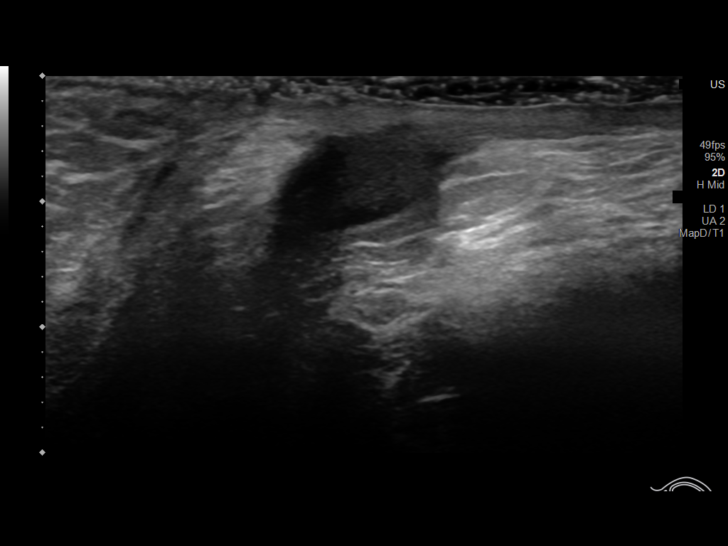
[im 27/53]
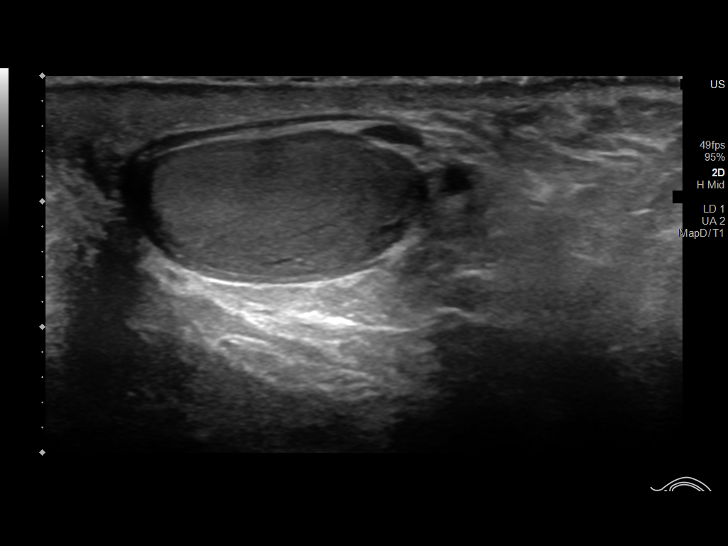
[im 31/53]
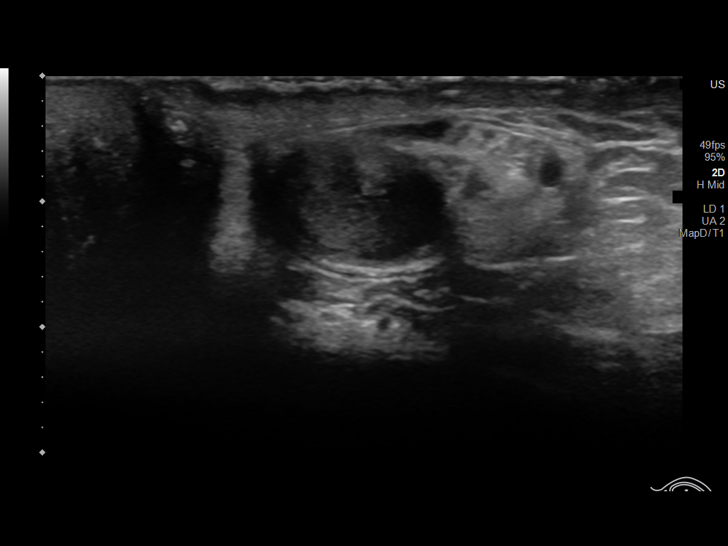
[im 35/53]
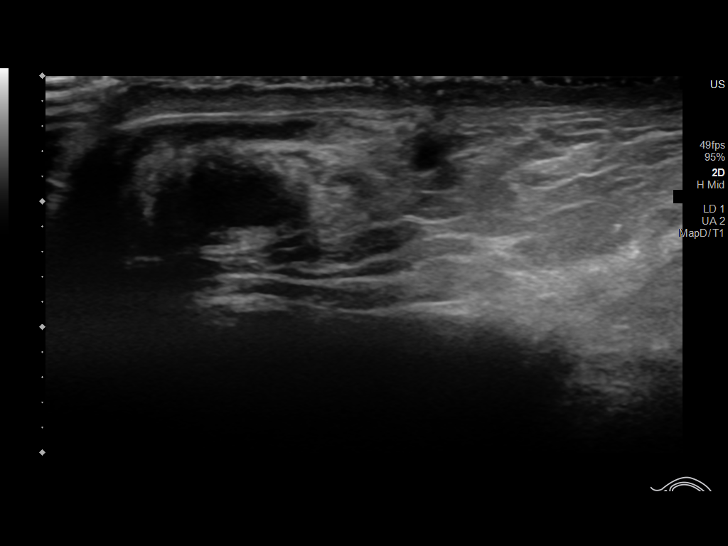
[im 40/53]
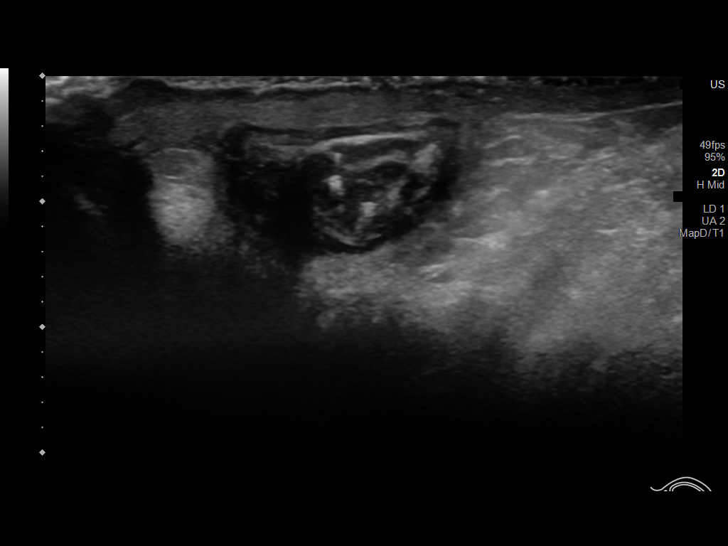
[im 44/53]
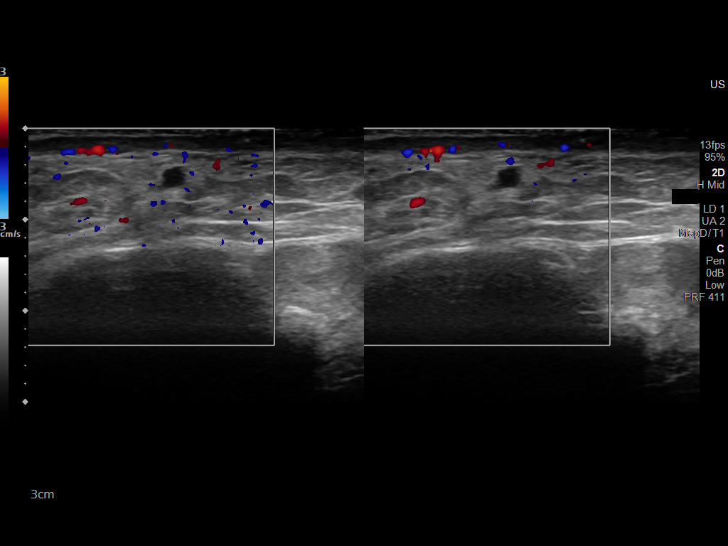
[im 48/53]
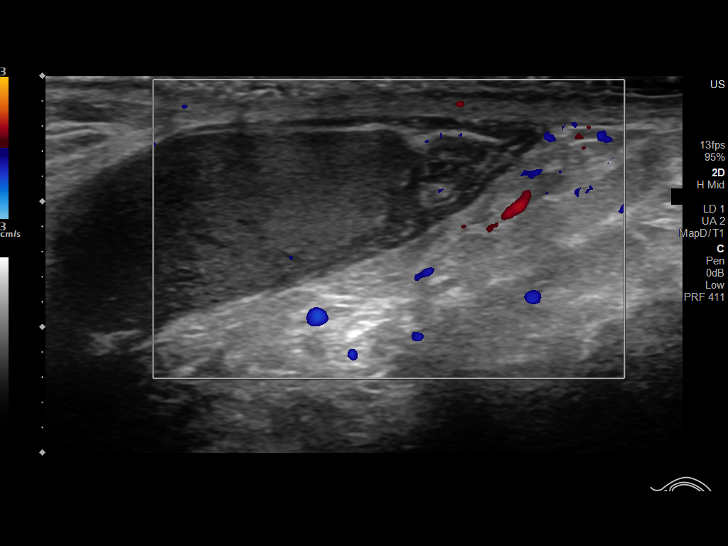
[im 53/53]
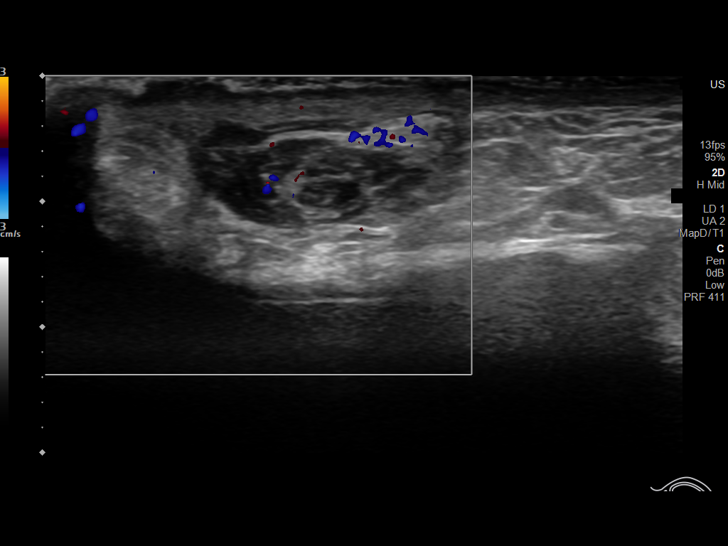

[13 of 25 positions shown; findings below may reference images not displayed]

FINDINGS: Right testicle

Measurements: 3.1 x 2.3 x 1.6 cm. Multi-septated cystic area is
noted in inferior portion of right testicle which measures 5.0 x
x 1.2 cm. This most likely represents benign abnormality such as
tubular ectasia of rete testes, but follow-up ultrasound is
recommended to ensure stability.

Left testicle

Measurements: 0.6 x 2.2 x 1.4 cm. No mass or microlithiasis
visualized.

Right epididymis:  Normal in size and appearance.

Left epididymis:  Normal in size and appearance.

Hydrocele:  Small right hydrocele is noted.

Varicocele:  None visualized.

Pulsed Doppler interrogation of both testes demonstrates normal low
resistance arterial and venous waveforms bilaterally.
IMPRESSION: No evidence of testicular torsion.

5 cm multi-septated cystic abnormality seen in the right testicle
most consistent with benign abnormality such as tubular ectasia of
rete testes, but follow-up ultrasound in 3 months is recommended to
ensure stability and to rule out mass.

Probable small right hydrocele.

## 2020-12-31 MED ORDER — ENOXAPARIN SODIUM 40 MG/0.4ML IJ SOSY
40.0000 mg | PREFILLED_SYRINGE | INTRAMUSCULAR | Status: DC
  Administered 2020-12-31 – 2021-01-01 (×2): 40 mg via SUBCUTANEOUS
  Filled 2020-12-31 (×2): qty 0.4

## 2020-12-31 MED ORDER — HYDROMORPHONE HCL 1 MG/ML IJ SOLN
0.5000 mg | INTRAMUSCULAR | Status: DC | PRN
  Administered 2020-12-31 – 2021-01-08 (×20): 0.5 mg via INTRAVENOUS
  Filled 2020-12-31 (×20): qty 0.5

## 2020-12-31 NOTE — TOC Initial Note (Signed)
Transition of Care [facility]) - Initial/Assessment Note    Patient Details  Name: Jason Hartman MRN: [id number] Date of Birth: Jan 12, 1936  [facility]) CM/SW Contact:    Vinie Sill, LCSW Phone Number: 12/31/2020, 1:15 PM  Clinical Narrative:                  CSW met with patient at bedside. Patient was resting, gave CSW permission to talk with his spouse. Called patient's wife,Kathryn, she confirmed patient arrived from  [facility]. She wants and agreeable to patient returning to [facility] once medically stable to discharge. She requested patient transport by [facility] once stable. No questions or concerns noted at this time.   TOC will continue to follow and assist with discharge planning.  Thurmond Butts, MSW, LCSW Clinical Social Worker    Expected Discharge Plan: Skilled Nursing Facility Barriers to Discharge: Continued Medical Work up   Patient Goals and CMS Choice        Expected Discharge Plan and Services Expected Discharge Plan: [place] In-house Referral: Clinical Social Work     Living arrangements for the past 2 months: Single Family Home                                      Prior Living Arrangements/Services Living arrangements for the past 2 months: Single Family Home Lives with:: Self, Spouse Patient language and need for interpreter reviewed:: No        Need for Family Participation in Patient Care: Yes (Comment) Care giver support system in place?: Yes (comment)   Criminal Activity/Legal Involvement Pertinent to Current Situation/Hospitalization: No - Comment as needed  Activities of Daily Living Home Assistive Devices/Equipment: Cane (specify quad or straight) ADL Screening (condition at time of admission) Patient's cognitive ability adequate to safely complete daily activities?: Yes Is the patient deaf or have difficulty hearing?: No Does the patient have difficulty seeing, even when wearing  glasses/contacts?: No Does the patient have difficulty concentrating, remembering, or making decisions?: Yes Patient able to express need for assistance with ADLs?: Yes Does the patient have difficulty dressing or bathing?: Yes Independently performs ADLs?: No Communication: Independent Dressing (OT): Needs assistance Is this a change from baseline?: Pre-admission baseline Grooming: Needs assistance Is this a change from baseline?: Pre-admission baseline Feeding: Independent Bathing: Needs assistance Is this a change from baseline?: Pre-admission baseline Toileting: Needs assistance Is this a change from baseline?: Pre-admission baseline Walks in Home: Needs assistance Is this a change from baseline?: Pre-admission baseline Does the patient have difficulty walking or climbing stairs?: Yes Weakness of Legs: Both Weakness of Arms/Hands: Both  Permission Sought/Granted   Permission granted to share information with : Yes, Verbal Permission Granted  Share Information with NAME: Elbert Spickler     Permission granted to share info w Relationship: spouse  Permission granted to share info w Contact Information: [phone]  Emotional Assessment Appearance:: Appears stated age Attitude/Demeanor/Rapport: Unable to Assess (on and off sleeping)   Orientation: : Oriented to Self, Oriented to Place, Oriented to  Time, Oriented to Situation Alcohol / Substance Use: Not Applicable Psych Involvement: No (comment)  Admission diagnosis:  Hip fracture ([place]) [S72.009A] Fall [W19.XXXA] Patient Active Problem List   Diagnosis Date Noted   Hip fracture ([place]) 12/29/2020   Malnutrition of moderate degree 10/27/2020   UTI (urinary tract infection) 10/25/2020   Sepsis ([place]) 10/12/2020   AMS (  altered mental status)    PAF (paroxysmal atrial fibrillation) (HCC)    Acute metabolic encephalopathy 20/80/2233   Anxiety 09/09/2020   SIRS (systemic inflammatory response syndrome) (HCC) 09/09/2020    Diaphoresis    Slow transit constipation 08/07/2020   Abdominal aortic aneurysm without rupture ([place]) 03/17/2020   Abnormal gait 03/17/2020   Ascending aorta dilatation ([place]) 03/17/2020   Diabetic renal disease ([place]) 03/17/2020   Recurrent UTI 07/16/2019   Hypertensive urgency 04/26/2019   Malignant HTN with heart disease, w/o CHF, w/o chronic kidney disease 04/26/2019   Elevated troponin    LFTs abnormal    Statin intolerance 01/15/2019   Penis pain 04/10/2018   Urethra cancer ([place]) 02/25/2018   Lower extremity edema 06/09/2017   Peptic ulcer disease 05/16/2017   GERD (gastroesophageal reflux disease) 05/16/2017   Chronic kidney disease, stage 3a ([place]) 04/11/2017   Lower urinary tract symptoms (LUTS) 04/15/2016   Narcotic dependence ([place]) 08/31/2015   Imbalance 08/31/2015   Diabetic peripheral neuropathy ([place]) 08/31/2015   Other malaise and fatigue 05/02/2015   Chronic fatigue 05/02/2015   Panic disorder without agoraphobia 03/22/2015   Hyperlipidemia 02/03/2014   Thrombocytopenia ([place]) 08/18/2013   Parkinsonism ([place]) 08/18/2013   Other disorders of lung 08/18/2013   Organic impotence 08/18/2013   Memory loss 08/18/2013   Low back pain 08/18/2013   Iron deficiency anemia 08/18/2013   Hypercalcemia 08/18/2013   Cellulitis of right leg 08/18/2013   Atherosclerotic heart disease of native coronary artery without angina pectoris 08/18/2013   RLS (restless legs syndrome) 09/01/2012   HERPES SIMPLEX INFECTION 11/06/2006   Type II diabetes mellitus ([place]) 11/06/2006   HYPOGONADISM 11/06/2006   VITAMIN D DEFICIENCY 11/06/2006   ANXIETY 11/06/2006   OBSTRUCTIVE SLEEP APNEA 11/06/2006   RESTLESS LEG SYNDROME 11/06/2006   Essential hypertension 11/06/2006   BENIGN PROSTATIC HYPERTROPHY 11/06/2006   ROSACEA 11/06/2006   OSTEOARTHRITIS 11/06/2006   INSOMNIA 11/06/2006   HYPERGLYCEMIA 11/06/2006   COLONIC POLYPS, HX OF 11/06/2006   PCP:  [name], MD Pharmacy:   [facility] - [place], [place] - [address] [address] [address] [place] [address] Phone: [phone] Fax: [phone]     Social Determinants of Health (SDOH) Interventions    Readmission Risk Interventions No flowsheet data found.

## 2020-12-31 NOTE — Progress Notes (Signed)
Pt refused CPAP for tonight.  

## 2020-12-31 NOTE — TOC CAGE-AID Note (Signed)
Transition of Care [facility]) - CAGE-AID Screening   Patient Details  Name: Jason Hartman MRN: [id number] Date of Birth: 02-23-1936  [facility]) CM/SW Contact:    Clovis Cao, RN Phone Number: [phone] 12/31/2020, 6:26 PM   Clinical Narrative: Pt here after sustaining a fall a few days ago which resulted in a left hip fracture.  He denies alcohol or drug abuse.   CAGE-AID Screening:    Have You Ever Felt You Ought to Cut Down on Your Drinking or Drug Use?: No Have People Annoyed You By Critizing Your Drinking Or Drug Use?: No Have You Felt Bad Or Guilty About Your Drinking Or Drug Use?: No Have You Ever Had a Drink or Used Drugs First Thing In The Morning to Steady Your Nerves or to Get Rid of a Hangover?: No CAGE-AID Score: 0  Substance Abuse Education Offered: No

## 2020-12-31 NOTE — Progress Notes (Signed)
   Subjective: Patient reports significant pain in the left hip this morning.  This is superimposed on a mild component of delirium.  He is repeatedly calling out asking for nurses and staff members not to leave.  He has been moving about in the bed significantly.   Objective:   VITALS:   Vitals:   12/30/20 1405 12/30/20 2200 12/31/20 0040 12/31/20 0635  BP: 121/69 (!) 158/79 140/78 120/66  Pulse: 78 98 (!) 105 95  Resp: 18 18 16 16   Temp:  98.4 F (36.9 C) 98.6 F (37 C) 98.7 F (37.1 C)  TempSrc:  Oral Oral Oral  SpO2: 100%   98%  Weight:      Height:       On examination he has his legs twisted in multiple ways with the left leg externally rotated.  He was counseled and advised against this as well as with the nurse at the bedside.  Left leg is able to be straightened with equal limb lengths.  He is able to fire all of the extensors of his left toes as well as flexors.  Sensation is intact in all distributions of the left foot and equal to the contralateral side.  Toes are warm and well-perfused  Lab Results  Component Value Date   WBC 18.4 (H) 12/31/2020   HGB 10.4 (L) 12/31/2020   HCT 31.3 (L) 12/31/2020   MCV 88.7 12/31/2020   PLT 233 12/31/2020     Assessment/Plan:  1 Day Post-Op   - Expected postop acute blood loss anemia - will monitor for symptoms - Patient to work with PT/OT to optimize mobilization safely - DVT ppx - SCDs, ambulation, Lovenox started today - Postoperative Abx: Ancef x 2 additional doses given - WBAT operative extremity - Pain control - multimodal pain management, ATC acetaminophen in conjunction with as needed narcotic (oxycodone), although this should be minimized with other modalities.  Added additional breakthrough Dilaudid as he is in significant pain - Discharge planning pending better pain control as well as mobility assessment   Trenita Hulme 12/31/2020, 7:55 AM

## 2020-12-31 NOTE — Plan of Care (Signed)

## 2020-12-31 NOTE — Progress Notes (Addendum)
PROGRESS NOTE    Jason Hartman  KGY:[id number] DOB: 09/10/1935 DOA: 12/29/2020 PCP: [name], MD   Chief Complaint  Patient presents with   Hip Pain   Brief Narrative Jason Hartman is Jason Hartman 85 y.o. male with medical history significant of HTN; OSA; RLS; and DM presenting with fall.  He is in [facility] for deconditioning after Niko Jakel severe UTI.  He was attempting to transfer from wheelchair to bed without assistance when he fell and landed on his hip.  This happened 2-3 days ago and he thought it would get better but when he still couldn't bear weight he decided to come in.    Assessment & Plan:   Principal Problem:   Hip fracture ([place]) Active Problems:   Type II diabetes mellitus ([place])   OBSTRUCTIVE SLEEP APNEA   Essential hypertension   Atherosclerotic heart disease of native coronary artery without angina pectoris   Anxiety   PAF (paroxysmal atrial fibrillation) ([facility])  Hip fracture -Mechanical fall resulting in hip fracture -L hip CT with mildly impacted subcapital L femoral neck fracture -plain films of L hand with severe OA, no acute abnormality. Plain films of shoulder with degenerative joint disease, no acute abnormality.  Negative L elbow.   -Orthopedics consulted, s/p L hemiarthroplasty 9/24 -resume eliquis 24 hrs post op -Pain control with Robxain, Vicodin, and Fentanyl prn  Testicular pain - [place] pending - pain seems resolved at this point, follow [place]   CAD -s/p stents -EKG pending - sinus, first degree AV block, mildly prolonged QTc -No c/o chest pain   Afib -Continue Amiodarone, Lopressor for rate control -resume Qtc tomorrow   Mood d/o  Delirium -?mild cognitive impairment -Continue Elavil, Cymbalta, melatonin   HTN -Continue Lopressor   DM -A1c 7.2 -Continue Lantus -Hold Glucophage, Ozempic -Cover with moderate-scale SSI  -Continue Neurontin   OSA -Continue CPAP  DVT prophylaxis: SCD Code Status:full  Family Communication: called wife, no  answer 9/25 Disposition:   Status is: Inpatient  Remains inpatient appropriate because:Inpatient level of care appropriate due to severity of illness  Dispo: The patient is [name]              Anticipated d/c is to:  pending              Patient currently is not medically stable to d/c.   Difficult to place patient No       Consultants:  orthopedics  Procedures: 9/24  Left hip hemiarthroplasty  Antimicrobials: Anti-infectives (From admission, onward)    Start     Dose/Rate Route Frequency Ordered Stop   12/30/20 0945  ceFAZolin (ANCEF) IVPB 2g/100 mL premix        2 g 200 mL/hr over 30 Minutes Intravenous On call to O.R. 12/30/20 0849 12/30/20 1036          Subjective: Anxious asking for pain meds  Objective: Vitals:   12/30/20 1405 12/30/20 2200 12/31/20 0040 12/31/20 0635  BP: 121/69 (!) 158/79 140/78 120/66  Pulse: 78 98 (!) 105 95  Resp: 18 18 16 16   Temp:  98.4 F (36.9 C) 98.6 F (37 C) 98.7 F (37.1 C)  TempSrc:  Oral Oral Oral  SpO2: 100%   98%  Weight:      Height:       No intake or output data in the 24 hours ending 12/31/20 1348  Filed Weights   12/29/20 1249  Weight: 68 kg    Examination:  General:  No acute distress. Cardiovascular: RRR Lungs: unlabored Abdomen: Soft, nontender, nondistended  GU exam, no ttp to bilateral testicles Neurological: anxious, confused Skin: Warm and dry. No rashes or lesions. Extremities: LLE with intact dressing    Data Reviewed: I have personally reviewed following labs and imaging studies  CBC: Recent Labs  Lab 12/29/20 1502 12/30/20 0431 12/31/20 0352  WBC 9.6 8.4 18.4*  NEUTROABS  --   --  15.9*  HGB 12.0* 11.5* 10.4*  HCT 35.9* 35.7* 31.3*  MCV 89.8 90.8 88.7  PLT 223 225 132    Basic Metabolic Panel: Recent Labs  Lab 12/29/20 1502 12/30/20 0431 12/31/20 0352  NA 136 137 132*  K 4.3 4.2 4.4  CL 101 100 98  CO2 26 30 23   GLUCOSE 132* 114* 183*  BUN 18 18 20    CREATININE 0.96 1.09 1.22  CALCIUM 8.9 9.1 8.7*  MG  --   --  1.5*  PHOS  --   --  3.5    GFR: Estimated Creatinine Clearance: 44.9 mL/min (by C-G formula based on SCr of 1.22 mg/dL).  Liver Function Tests: Recent Labs  Lab 12/31/20 0352  AST 20  ALT 10  ALKPHOS 68  BILITOT 1.2  PROT 6.4*  ALBUMIN 2.6*    CBG: Recent Labs  Lab 12/30/20 1234 12/30/20 1806 12/30/20 2049 12/31/20 0742 12/31/20 1311  GLUCAP 134* 355* 259* 285* 201*     Recent Results (from the past 240 hour(s))  Resp Panel by RT-PCR (Flu Makaylia Hewett&B, Covid) Nasopharyngeal Swab     Status: None   Collection Time: 12/29/20  2:26 PM   Specimen: Nasopharyngeal Swab; Nasopharyngeal(NP) swabs in vial transport medium  Result Value Ref Range Status   SARS Coronavirus 2 by RT PCR NEGATIVE NEGATIVE Final    Comment: (NOTE) SARS-CoV-2 target nucleic acids are NOT DETECTED.  The SARS-CoV-2 RNA is generally detectable in upper respiratory specimens during the acute phase of infection. The lowest concentration of SARS-CoV-2 viral copies this assay can detect is 138 copies/mL. Zyla Dascenzo negative result does not preclude SARS-Cov-2 infection and should not be used as the sole basis for treatment or other patient management decisions. Bernadette Gores negative result may occur with  improper specimen collection/handling, submission of specimen other than nasopharyngeal swab, presence of viral mutation(s) within the areas targeted by this assay, and inadequate number of viral copies(<138 copies/mL). Arnelle Nale negative result must be combined with clinical observations, patient history, and epidemiological information. The expected result is Negative.  Fact Sheet for Patients:  EntrepreneurPulse.com.au  Fact Sheet for Healthcare Providers:  IncredibleEmployment.be  This test is no t yet approved or cleared by the [place] FDA and  has been authorized for detection and/or diagnosis of SARS-CoV-2 by FDA  under an Emergency Use Authorization (EUA). This EUA will remain  in effect (meaning this test can be used) for the duration of the COVID-19 declaration under Section 564(b)(1) of the Act, 21 U.S.C.section 360bbb-3(b)(1), unless the authorization is terminated  or revoked sooner.       Influenza Alieyah Spader by PCR NEGATIVE NEGATIVE Final   Influenza B by PCR NEGATIVE NEGATIVE Final    Comment: (NOTE) The Xpert Xpress SARS-CoV-2/FLU/RSV plus assay is intended as an aid in the diagnosis of influenza from Nasopharyngeal swab specimens and should not be used as Waver Dibiasio sole basis for treatment. Nasal washings and aspirates are unacceptable for Xpert Xpress SARS-CoV-2/FLU/RSV testing.  Fact Sheet for Patients: EntrepreneurPulse.com.au  Fact Sheet for Healthcare Providers: IncredibleEmployment.be  This test is not  yet approved or cleared by the [place] and has been authorized for detection and/or diagnosis of SARS-CoV-2 by FDA under an Emergency Use Authorization (EUA). This EUA will remain in effect (meaning this test can be used) for the duration of the COVID-19 declaration under Section 564(b)(1) of the Act, 21 U.S.C. section 360bbb-3(b)(1), unless the authorization is terminated or revoked.  Performed at [facility], [place] [address] [place], [place] [address]   Surgical pcr screen     Status: Abnormal   Collection Time: 12/29/20 11:11 PM   Specimen: Nasal Mucosa; Nasal Swab  Result Value Ref Range Status   MRSA, PCR POSITIVE (Malaak Stach) NEGATIVE Corrected    Comment: RESULT CALLED TO, READ BACK BY AND VERIFIED WITH: RN SYLVIA M. 12/30/20@1 :00 BY TW    Staphylococcus aureus POSITIVE (Randee Upchurch) NEGATIVE Corrected    Comment: (NOTE) The Xpert SA Assay (FDA approved for NASAL specimens in patients 54 years of age and older), is one component of Garion Wempe comprehensive surveillance program. It is not intended to diagnose infection nor to guide or monitor  treatment. Performed at [facility], [place] [address], [place], [place] [address] CORRECTED ON 09/24 AT 0114: PREVIOUSLY REPORTED AS POSITIVE          Radiology Studies: DG Pelvis 1-2 Views  Result Date: 12/30/2020 CLINICAL DATA:  Postop left hip arthroplasty. EXAM: PELVIS - 1 VIEW COMPARISON:  12/29/2020 FINDINGS: Single AP pelvis radiograph demonstrates Etheleen Valtierra left hip hemiarthroplasty that appears well seated and aligned. No acute fracture or evidence of an operative complication. IMPRESSION: Well-positioned left hip hemiarthroplasty. Electronically Signed   By: [name] M.D.   On: 12/30/2020 13:06   CT Hip Left Wo Contrast  Result Date: 12/29/2020 CLINICAL DATA:  Left hip pain after fall. EXAM: CT OF THE LEFT HIP WITHOUT CONTRAST TECHNIQUE: Multidetector CT imaging of the left hip was performed according to the standard protocol. Multiplanar CT image reconstructions were also generated. COMPARISON:  Pelvis and left femur x-rays from same day. FINDINGS: Bones/Joint/Cartilage Acute mildly impacted subcapital left femoral neck fracture. No dislocation. Joint spaces are preserved. No joint effusion. Ligaments Ligaments are suboptimally evaluated by CT. Muscles and Tendons Grossly intact. Soft tissue No fluid collection or hematoma.  No soft tissue mass. IMPRESSION: 1. Acute mildly impacted subcapital left femoral neck fracture. Electronically Signed   By: Titus Dubin M.D.   On: 12/29/2020 15:29   DG HIP OPERATIVE UNILAT WITH PELVIS LEFT  Result Date: 12/30/2020 CLINICAL DATA:  LEFT hip arthroplasty EXAM: OPERATIVE LEFT HIP 1 VIEWS TECHNIQUE: Fluoroscopic spot image(s) were submitted for interpretation post-operatively. COMPARISON:  December 29, 2020 FINDINGS: Spot fluoroscopy images were obtained for surgical planning purposes. Partial visualization of Karagan Lehr LEFT hip arthroplasty. Clamp projects over the upper pelvis. IMPRESSION: Spot fluoroscopy images were obtained for surgical planning  purposes. Electronically Signed   By: Valentino Saxon M.D.   On: 12/30/2020 12:11        Scheduled Meds:  amiodarone  200 mg Oral Daily   amitriptyline  10 mg Oral QHS   clonazePAM  0.5 mg Oral BID   docusate sodium  100 mg Oral BID   enoxaparin (LOVENOX) injection  40 mg Subcutaneous Q24H   feeding supplement  237 mL Oral BID BM   gabapentin  300 mg Oral QHS   insulin aspart  0-15 Units Subcutaneous TID WC   insulin aspart  0-5 Units Subcutaneous QHS   insulin glargine-yfgn  8 Units Subcutaneous BID  melatonin  5 mg Oral QHS   metoprolol tartrate  12.5 mg Oral BID   multivitamin with minerals  1 tablet Oral Daily   mupirocin ointment  1 application Nasal BID   polyethylene glycol  17 g Oral BID   Continuous Infusions:  methocarbamol (ROBAXIN) IV       LOS: 2 days    Time spent: over 30 min    Fayrene Helper, MD [facility]   To contact the attending provider between 7A-7P or the covering provider during after hours 7P-7A, please log into the web site [email or web address] and access using universal [place] password for that web site. If you do not have the password, please call the hospital operator.  12/31/2020, 1:48 PM

## 2021-01-01 ENCOUNTER — Inpatient Hospital Stay (HOSPITAL_COMMUNITY): Payer: Medicare Other

## 2021-01-01 ENCOUNTER — Encounter (HOSPITAL_COMMUNITY): Payer: Self-pay | Admitting: Orthopaedic Surgery

## 2021-01-01 DIAGNOSIS — S72002A Fracture of unspecified part of neck of left femur, initial encounter for closed fracture: Secondary | ICD-10-CM | POA: Diagnosis not present

## 2021-01-01 LAB — GLUCOSE, CAPILLARY
Glucose-Capillary: 191 mg/dL — ABNORMAL HIGH (ref 70–99)
Glucose-Capillary: 209 mg/dL — ABNORMAL HIGH (ref 70–99)
Glucose-Capillary: 114 mg/dL — ABNORMAL HIGH (ref 70–99)
Glucose-Capillary: 208 mg/dL — ABNORMAL HIGH (ref 70–99)

## 2021-01-01 IMAGING — US US RENAL
1 series · 14 of 25 positions shown · non-contrast
Comparison: [DATE]

CLINICAL DATA: Left-sided abdominal pain

EXAM:
RENAL / URINARY TRACT ULTRASOUND COMPLETE

[Series 1: us renal · 14 of 30 slices shown]
[im 1/30]
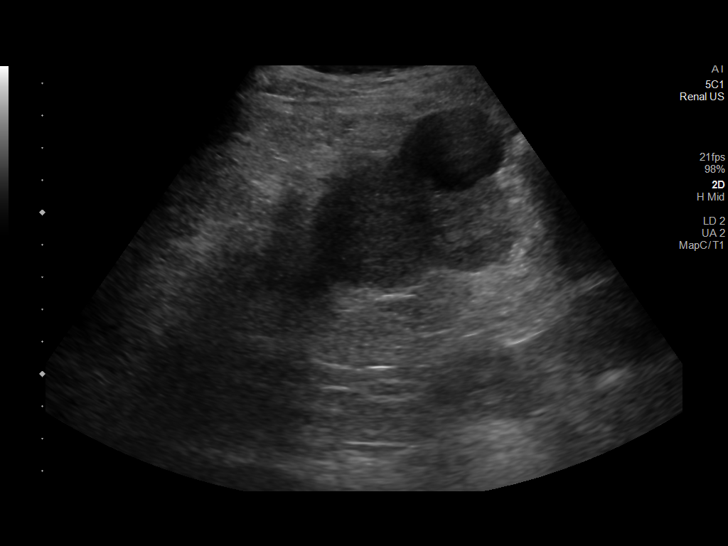
[im 3/30]
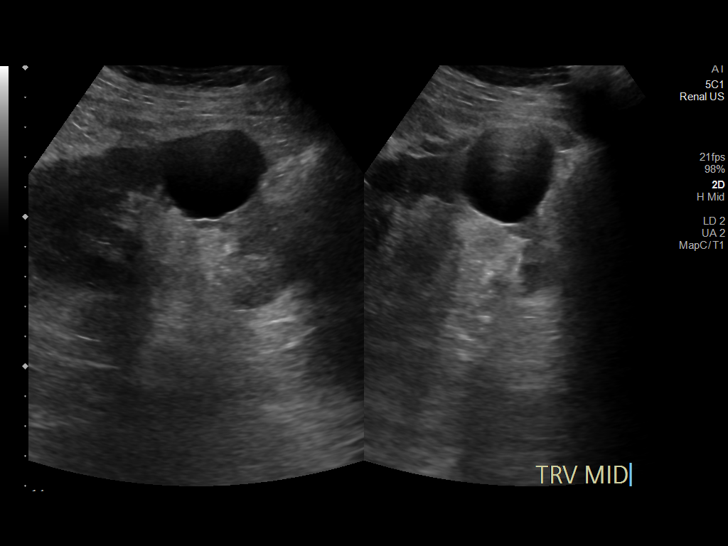
[im 5/30]
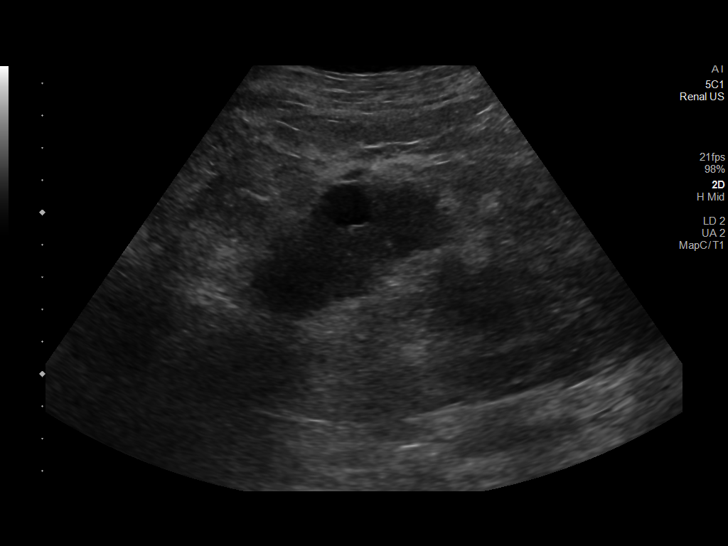
[im 8/30]
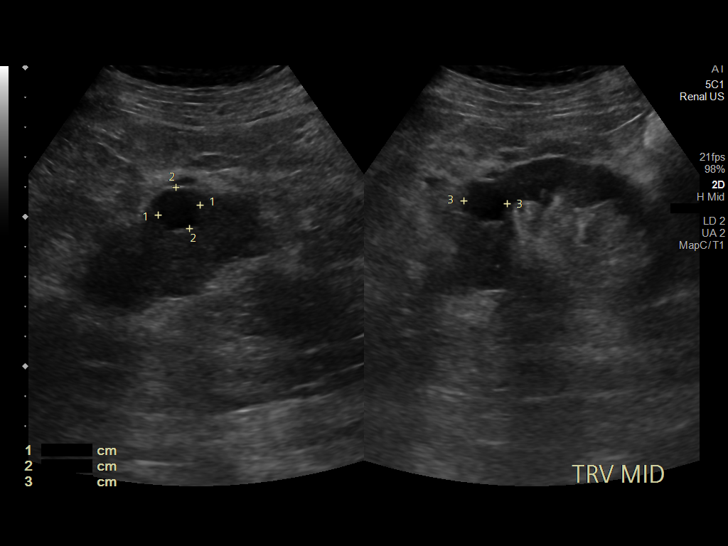
[im 10/30]
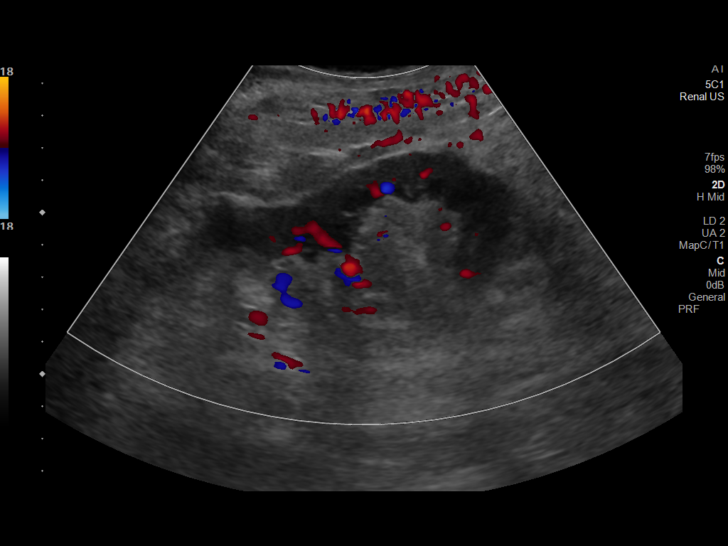
[im 11/30]
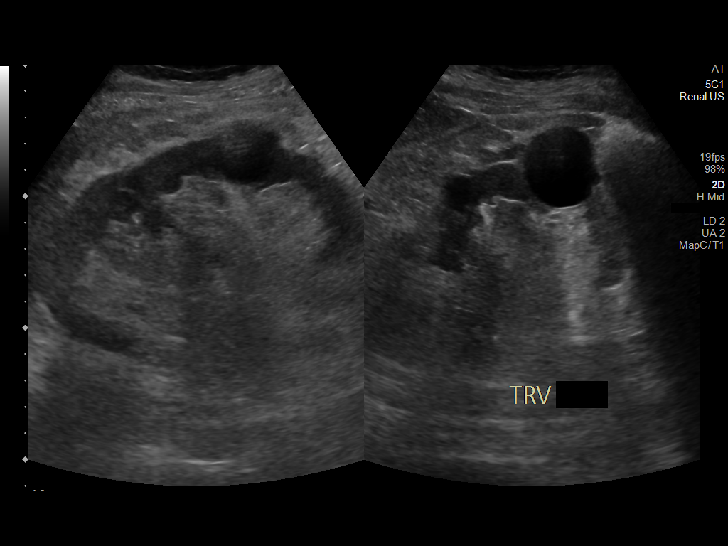
[im 14/30]
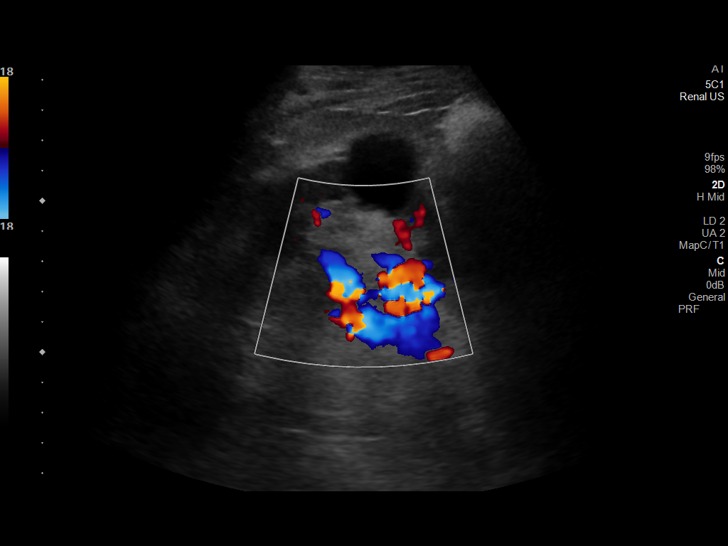
[im 16/30]
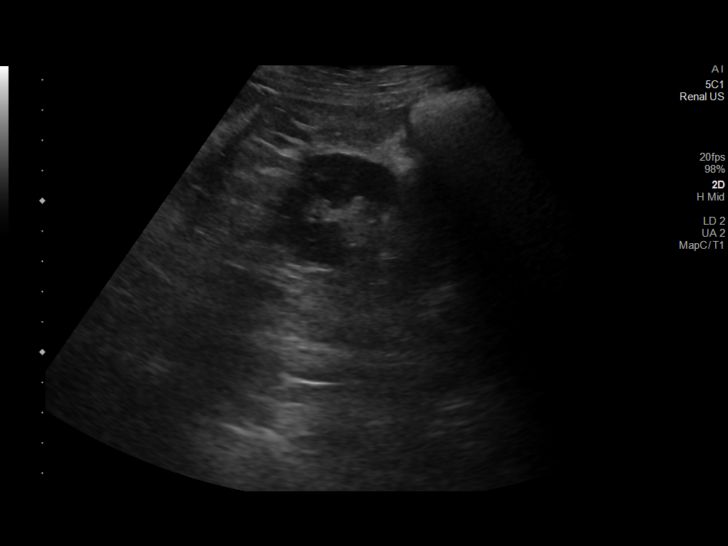
[im 19/30]
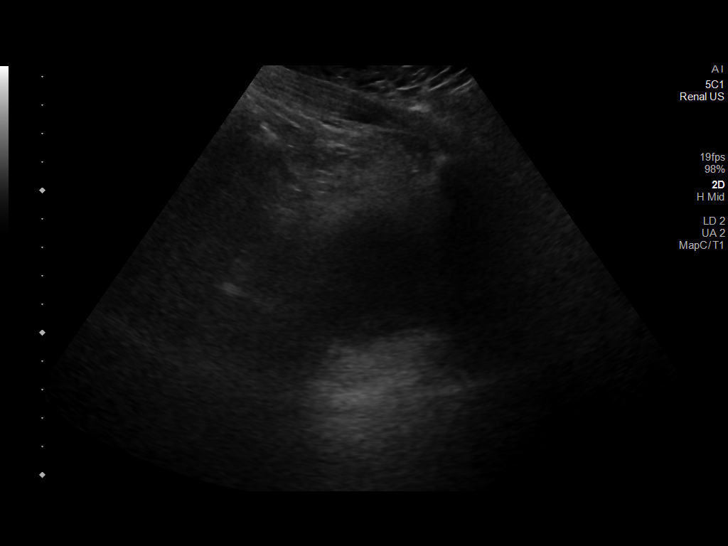
[im 20/30]
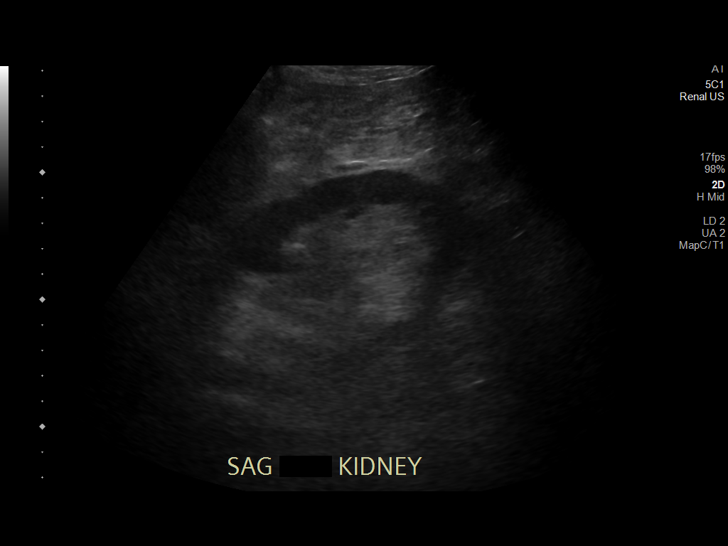
[im 22/30]
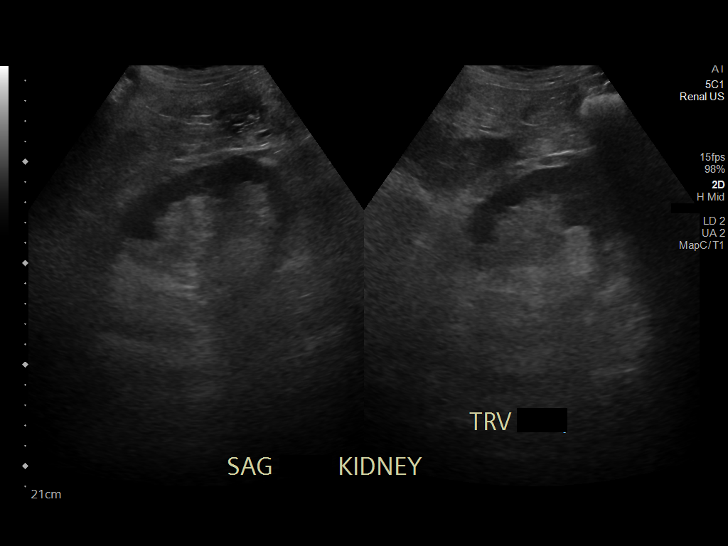
[im 25/30]
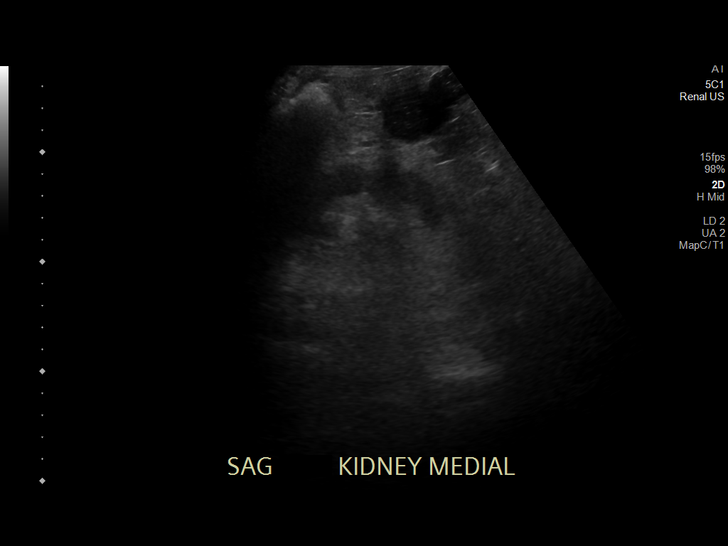
[im 27/30]
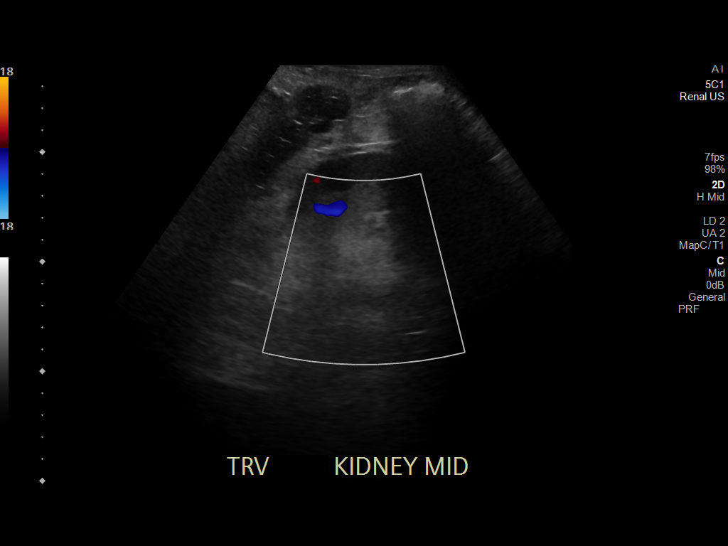
[im 30/30]
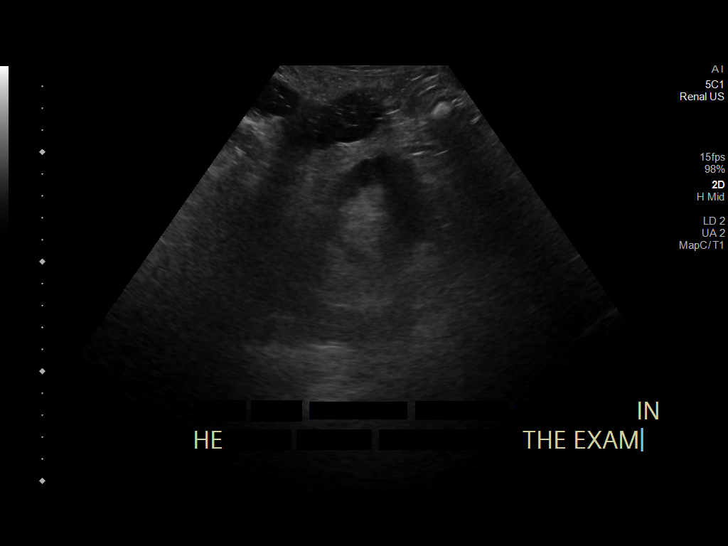

[14 of 25 positions shown; findings below may reference images not displayed]

FINDINGS: Right Kidney:

Renal measurements: 12.0 x 6.6 x 6.5 cm = volume: 270.6 mL. Normal
renal cortical echotexture. Multiple simple appearing cysts are
identified largest measuring 3.3 cm. No hydronephrosis or
nephrolithiasis.

Left Kidney:

Renal measurements: 11.8 x 7.5 x 7.0 cm = volume: 322.4 mL.
Echogenicity within normal limits. No mass or hydronephrosis
visualized.

Bladder:

Not well visualized.

Other:

Examination was terminated prematurely at the patient's request due
to pain.
IMPRESSION: 1. Simple right renal cysts. Otherwise unremarkable renal
ultrasound.

## 2021-01-01 MED ORDER — APIXABAN 5 MG PO TABS
5.0000 mg | ORAL_TABLET | Freq: Two times a day (BID) | ORAL | Status: DC
  Administered 2021-01-02 – 2021-01-08 (×13): 5 mg via ORAL
  Filled 2021-01-01 (×14): qty 1

## 2021-01-01 NOTE — Consult Note (Signed)
I have been asked to see the patient by Dr. Fayrene Helper, for evaluation and management of testicular pain.  History of present illness: 85 year old man who presented to the ED status post fall found to have hip fracture.  He was residing in [facility] for deconditioning after severe urinary tract infection.  Patient has a history of bladder cancer as well as urolithiasis and is followed by Dr. Thomasene Mohair in [facility].  He has had urinary retention in the past specifically in June during the time of UTI.  He has also undergone recent circumcision.  Over the weekend he reported left testicular pain.  The pain comes and goes.  Urology was called after scrotal ultrasound showed no concern for torsion but likely benign cyst abnormality.  Patient family member in the room and states that patient has low pain tolerance.   Review of systems: A 12 point comprehensive review of systems was obtained and is negative unless otherwise stated in the history of present illness.  Patient Active Problem List   Diagnosis Date Noted   Hip fracture ([place]) 12/29/2020   Malnutrition of moderate degree 10/27/2020   UTI (urinary tract infection) 10/25/2020   Sepsis ([place]) 10/12/2020   AMS (altered mental status)    PAF (paroxysmal atrial fibrillation) (HCC)    Acute metabolic encephalopathy 09/98/3382   Anxiety 09/09/2020   SIRS (systemic inflammatory response syndrome) ([place]) 09/09/2020   Diaphoresis    Slow transit constipation 08/07/2020   Abdominal aortic aneurysm without rupture ([place]) 03/17/2020   Abnormal gait 03/17/2020   Ascending aorta dilatation ([place]) 03/17/2020   Diabetic renal disease ([place]) 03/17/2020   Recurrent UTI 07/16/2019   Hypertensive urgency 04/26/2019   Malignant HTN with heart disease, w/o CHF, w/o chronic kidney disease 04/26/2019   Elevated troponin    LFTs abnormal    Statin intolerance 01/15/2019   Penis pain 04/10/2018   Urethra cancer ([place]) 02/25/2018   Lower extremity edema  06/09/2017   Peptic ulcer disease 05/16/2017   GERD (gastroesophageal reflux disease) 05/16/2017   Chronic kidney disease, stage 3a ([place]) 04/11/2017   Lower urinary tract symptoms (LUTS) 04/15/2016   Narcotic dependence ([place]) 08/31/2015   Imbalance 08/31/2015   Diabetic peripheral neuropathy ([place]) 08/31/2015   Other malaise and fatigue 05/02/2015   Chronic fatigue 05/02/2015   Panic disorder without agoraphobia 03/22/2015   Hyperlipidemia 02/03/2014   Thrombocytopenia ([place]) 08/18/2013   Parkinsonism ([place]) 08/18/2013   Other disorders of lung 08/18/2013   Organic impotence 08/18/2013   Memory loss 08/18/2013   Low back pain 08/18/2013   Iron deficiency anemia 08/18/2013   Hypercalcemia 08/18/2013   Cellulitis of right leg 08/18/2013   Atherosclerotic heart disease of native coronary artery without angina pectoris 08/18/2013   RLS (restless legs syndrome) 09/01/2012   HERPES SIMPLEX INFECTION 11/06/2006   Type II diabetes mellitus ([place]) 11/06/2006   HYPOGONADISM 11/06/2006   VITAMIN D DEFICIENCY 11/06/2006   ANXIETY 11/06/2006   OBSTRUCTIVE SLEEP APNEA 11/06/2006   RESTLESS LEG SYNDROME 11/06/2006   Essential hypertension 11/06/2006   BENIGN PROSTATIC HYPERTROPHY 11/06/2006   ROSACEA 11/06/2006   OSTEOARTHRITIS 11/06/2006   INSOMNIA 11/06/2006   HYPERGLYCEMIA 11/06/2006   COLONIC POLYPS, HX OF 11/06/2006    No current facility-administered medications on file prior to encounter.   Current Outpatient Medications on File Prior to Encounter  Medication Sig Dispense Refill   acetaminophen (TYLENOL) 500 MG tablet Take 1,000 mg by mouth every 8 (eight) hours.     Acidophilus Lactobacillus CAPS Take  1 capsule by mouth every evening.     amiodarone (PACERONE) 200 MG tablet Take 1 tablet (200 mg total) by mouth daily.     amitriptyline (ELAVIL) 10 MG tablet Take 1 tablet (10 mg total) by mouth at bedtime. 3 tablet 0   citalopram (CELEXA) 10 MG tablet Take 10 mg by mouth every  morning. Take with a 20 mg tablet for a total dose of 30 mg every morning     citalopram (CELEXA) 20 MG tablet Take 20 mg by mouth every morning. Take with a 10 mg tablet for a total dose of 30 mg every morning     clonazePAM (KLONOPIN) 0.5 MG tablet Take 0.5 mg by mouth 2 (two) times daily.     famotidine (PEPCID) 20 MG tablet Take 20 mg by mouth every morning.     ferrous sulfate 325 (65 FE) MG EC tablet Take 325 mg by mouth every morning.     gabapentin (NEURONTIN) 300 MG capsule Take 1 capsule (300 mg total) by mouth at bedtime.     LANTUS SOLOSTAR 100 UNIT/ML Solostar Pen Inject 8 Units into the skin 2 (two) times daily. 15 mL 11   melatonin 5 MG TABS Take 5 mg by mouth at bedtime.     metFORMIN (GLUCOPHAGE-XR) 500 MG 24 hr tablet Take 500 mg by mouth daily with breakfast.     metoprolol tartrate (LOPRESSOR) 25 MG tablet Take 0.5 tablets (12.5 mg total) by mouth 2 (two) times daily.     NYAMYC powder Apply 1 application topically in the morning and at bedtime. To rash on back and groin and sacrum     polyethylene glycol powder (GLYCOLAX/MIRALAX) 17 GM/SCOOP powder Take 17 g by mouth every morning. Mix in 8 oz water and drink     Rotigotine (NEUPRO) 8 MG/24HR PT24 Place 1 patch onto the skin at bedtime. For Parkinson's     Semaglutide,0.25 or 0.5MG /DOS, (OZEMPIC, 0.25 OR 0.5 MG/DOSE,) 2 MG/1.5ML SOPN Inject 0.5 mg into the skin every Tuesday.     senna-docusate (SENOKOT-S) 8.6-50 MG tablet Take 1 tablet by mouth 2 (two) times daily as needed for moderate constipation.     thiamine 100 MG tablet Take 1 tablet (100 mg total) by mouth daily.     traMADol (ULTRAM) 50 MG tablet Take 1 tablet (50 mg total) by mouth daily. (Patient taking differently: Take 75 mg by mouth 2 (two) times daily.) 12 tablet 0   apixaban (ELIQUIS) 5 MG TABS tablet Take 1 tablet (5 mg total) by mouth 2 (two) times daily. (Patient not taking: Reported on 12/29/2020) 60 tablet    DULoxetine (CYMBALTA) 30 MG capsule Take 1  capsule (30 mg total) by mouth daily. (Patient not taking: Reported on 12/29/2020) 3 capsule 0    Past Medical History:  Diagnosis Date   CAD (coronary artery disease)    s/p stents   Cellulitis and abscess of leg, except foot    Hypertension    Obstructive sleep apnea (adult) (pediatric)    Restless legs syndrome (RLS)    Thoracic or lumbosacral neuritis or radiculitis, unspecified    Type II or unspecified type diabetes mellitus without mention of complication, not stated as uncontrolled    Unspecified disease of pericardium    Unspecified hereditary and idiopathic peripheral neuropathy     Past Surgical History:  Procedure Laterality Date   ANAL FISSURE REPAIR     ANTERIOR APPROACH HEMI HIP ARTHROPLASTY Left 12/30/2020   Procedure: ANTERIOR APPROACH HIP  ARTHROPLASTY;  Surgeon: Vanetta Mulders, MD;  Location: [facility];  Service: Orthopedics;  Laterality: Left;   pericarditis  2009    Social History   Tobacco Use   Smoking status: Former    Types: Cigarettes    Quit date: 09/01/1980    Years since quitting: 40.3   Smokeless tobacco: Never  Vaping Use   Vaping Use: Never used  Substance Use Topics   Alcohol use: Yes    Comment: rare   Drug use: Never    Family History  Problem Relation Age of Onset   Diabetes Father     PE: Vitals:   12/31/20 0635 12/31/20 1945 01/01/21 0728 01/01/21 1518  BP: 120/66 132/60 128/68 125/69  Pulse: 95 88 96 98  Resp: 16 20 16 16   Temp: 98.7 F (37.1 C) 98.6 F (37 C) 98.7 F (37.1 C) (!) 97.5 F (36.4 C)  TempSrc: Oral Oral Oral Oral  SpO2: 98%  98% 99%  Weight:      Height:       Patient appears to be in no acute distress  patient is alert and oriented x3 Atraumatic normocephalic head No cervical or supraclavicular lymphadenopathy appreciated No increased work of breathing, no audible wheezes/rhonchi Regular sinus rhythm/rate Abdomen is soft, nontender, nondistended Circ penis with redundant foreskin, bilateral descended  testis that are ttp but without masses Lower extremities are symmetric without appreciable edema Grossly neurologically intact No identifiable skin lesions  Recent Labs    12/30/20 0431 12/31/20 0352  WBC 8.4 18.4*  HGB 11.5* 10.4*  HCT 35.7* 31.3*   Recent Labs    12/30/20 0431 12/31/20 0352  NA 137 132*  K 4.2 4.4  CL 100 98  CO2 30 23  GLUCOSE 114* 183*  BUN 18 20  CREATININE 1.09 1.22  CALCIUM 9.1 8.7*   No results for input(s): LABPT, INR in the last 72 hours. No results for input(s): LABURIN in the last 72 hours. Results for orders placed or performed during the hospital encounter of 12/29/20  Resp Panel by RT-PCR (Flu A&B, Covid) Nasopharyngeal Swab     Status: None   Collection Time: 12/29/20  2:26 PM   Specimen: Nasopharyngeal Swab; Nasopharyngeal(NP) swabs in vial transport medium  Result Value Ref Range Status   SARS Coronavirus 2 by RT PCR NEGATIVE NEGATIVE Final    Comment: (NOTE) SARS-CoV-2 target nucleic acids are NOT DETECTED.  The SARS-CoV-2 RNA is generally detectable in upper respiratory specimens during the acute phase of infection. The lowest concentration of SARS-CoV-2 viral copies this assay can detect is 138 copies/mL. A negative result does not preclude SARS-Cov-2 infection and should not be used as the sole basis for treatment or other patient management decisions. A negative result may occur with  improper specimen collection/handling, submission of specimen other than nasopharyngeal swab, presence of viral mutation(s) within the areas targeted by this assay, and inadequate number of viral copies(<138 copies/mL). A negative result must be combined with clinical observations, patient history, and epidemiological information. The expected result is Negative.  Fact Sheet for Patients:  EntrepreneurPulse.com.au  Fact Sheet for Healthcare Providers:  IncredibleEmployment.be  This test is no t yet approved  or cleared by the [place] FDA and  has been authorized for detection and/or diagnosis of SARS-CoV-2 by FDA under an Emergency Use Authorization (EUA). This EUA will remain  in effect (meaning this test can be used) for the duration of the COVID-19 declaration under Section 564(b)(1) of the Act, 21 U.S.C.section  360bbb-3(b)(1), unless the authorization is terminated  or revoked sooner.       Influenza A by PCR NEGATIVE NEGATIVE Final   Influenza B by PCR NEGATIVE NEGATIVE Final    Comment: (NOTE) The Xpert Xpress SARS-CoV-2/FLU/RSV plus assay is intended as an aid in the diagnosis of influenza from Nasopharyngeal swab specimens and should not be used as a sole basis for treatment. Nasal washings and aspirates are unacceptable for Xpert Xpress SARS-CoV-2/FLU/RSV testing.  Fact Sheet for Patients: EntrepreneurPulse.com.au  Fact Sheet for Healthcare Providers: IncredibleEmployment.be  This test is not yet approved or cleared by the [place] FDA and has been authorized for detection and/or diagnosis of SARS-CoV-2 by FDA under an Emergency Use Authorization (EUA). This EUA will remain in effect (meaning this test can be used) for the duration of the COVID-19 declaration under Section 564(b)(1) of the Act, 21 U.S.C. section 360bbb-3(b)(1), unless the authorization is terminated or revoked.  Performed at [facility], [place] [address], [place], [place] [address]   Surgical pcr screen     Status: Abnormal   Collection Time: 12/29/20 11:11 PM   Specimen: Nasal Mucosa; Nasal Swab  Result Value Ref Range Status   MRSA, PCR POSITIVE (A) NEGATIVE Corrected    Comment: RESULT CALLED TO, READ BACK BY AND VERIFIED WITH: RN SYLVIA M. 12/30/20@1 :00 BY TW    Staphylococcus aureus POSITIVE (A) NEGATIVE Corrected    Comment: (NOTE) The Xpert SA Assay (FDA approved for NASAL specimens in patients 37 years of age and older), is one component of  a comprehensive surveillance program. It is not intended to diagnose infection nor to guide or monitor treatment. Performed at [facility], [place] [address], [place], [place] [address] CORRECTED ON 09/24 AT 0114: PREVIOUSLY REPORTED AS POSITIVE     Imaging: Scrotal US IMPRESSION: No evidence of testicular torsion.   5 cm multi-septated cystic abnormality seen in the right testicle most consistent with benign abnormality such as tubular ectasia of rete testes, but follow-up ultrasound in 3 months is recommended to ensure stability and to rule out mass.   Probable small right hydrocele.     Electronically Signed   By: Marijo Conception M.D.   On: 12/31/2020 18:30  Imp/Recommendations: Intermittent testicular pain: -Physical exam is reassuring as is scrotal ultrasound.  He may follow-up scrotal ultrasound results with his urologist at [place] obtaining renal ultrasound to rule out hydronephrosis in the event that a distal ureteral calculus is the cause of testicular pain.  Would also allow [place] to see if patient is emptying his bladder okay.  Please page with any further questions or concerns. Ishmeal Rorie D Lorell Thibodaux

## 2021-01-01 NOTE — Progress Notes (Signed)
Nutrition Follow-up  DOCUMENTATION CODES:   Non-severe (moderate) malnutrition in context of chronic illness  INTERVENTION:   -Continue Ensure Enlive po BID, each supplement provides 350 kcal and 20 grams of protein  -Continue MVI with minerals daily -Feeding assistance with meals -Magic cup TID with meals, each supplement provides 290 kcal and 9 grams of protein  -Liberalize diet to regular for increased variety of meal selections  NUTRITION DIAGNOSIS:   Moderate Malnutrition related to chronic illness (CAD) as evidenced by moderate fat depletion, moderate muscle depletion, severe muscle depletion, percent weight loss.  Ongoing  GOAL:   Patient will meet greater than or equal to 90% of their needs  Un,et  MONITOR:   PO intake, Supplement acceptance, Labs, Weight trends, Skin, I & O's  REASON FOR ASSESSMENT:   Malnutrition Screening Tool, Consult Hip fracture protocol  ASSESSMENT:   Jason Hartman is a 85 y.o. male with medical history significant of HTN; OSA; RLS; and DM presenting with fall.  He is in [facility] for deconditioning after a severe UTI.  He was attempting to transfer from wheelchair to bed without assistance when he fell and landed on his hip.  This happened 2-3 days ago and he thought it would get better but when he still couldn't bear weight he decided to come in.  9/24- s/p Left hip hemiarthroplasty  Reviewed I/O's: +50 ml x 24 hours and +1350 ml since admission  UOP: 450 ml x 24 hours  Po 0-50%  Spoke with pt at bedside, who was very lethargic at time of visit. It was difficulty to keep pt awake for interview (continued to close his eyes during questioning) and speech was slightly garbled making it difficult to understand pt. Observed breakfast tray, which was untouched.   Pt with poor oral intake. Noted meal completion 0-50%.   Reviewed wt hx; pt has experienced a 15% wt loss over the past month, which is significant for time frame.    Medications reviewed and include colace and miralax.   Labs reviewed: CBGS: 165-191 (inpatient orders for glycemic control are 0-15 units insulin aspart TID with meals, 0-5 units insulin aspart daily at bedtime, and 8 units insulin glargine-yfgn daily at bedtime).    NUTRITION - FOCUSED PHYSICAL EXAM:  Flowsheet Row Most Recent Value  Orbital Region Moderate depletion  Upper Arm Region Moderate depletion  Thoracic and Lumbar Region Moderate depletion  Buccal Region Moderate depletion  Temple Region Moderate depletion  Clavicle Bone Region Severe depletion  Clavicle and Acromion Bone Region Severe depletion  Scapular Bone Region Severe depletion  Dorsal Hand Moderate depletion  Patellar Region Severe depletion  Anterior Thigh Region Severe depletion  Posterior Calf Region Severe depletion  Edema (RD Assessment) Mild  Hair Reviewed  Eyes Reviewed  Mouth Reviewed  Skin Reviewed  Nails Reviewed       Diet Order:   Diet Order             Diet heart healthy/carb modified Room service appropriate? Yes; Fluid consistency: Thin  Diet effective now                   EDUCATION NEEDS:   No education needs have been identified at this time  Skin:  Skin Assessment: Skin Integrity Issues: Skin Integrity Issues:: Other (Comment), Incisions Incisions: closed lt hip Other: MASD to buttocks  Last BM:  Unknown  Height:   Ht Readings from Last 1 Encounters:  12/29/20 5\' 10"  (1.778 m)  Weight:   Wt Readings from Last 1 Encounters:  12/29/20 68 kg    Ideal Body Weight:  75.5 kg  BMI:  Body mass index is 21.52 kg/m.  Estimated Nutritional Needs:   Kcal:  2050-2250  Protein:  100-115 grams  Fluid:  > 2 L    Loistine Chance, RD, LDN, [place] Registered Dietitian II Certified Diabetes Care and Education Specialist Please refer to [facility] for RD and/or RD on-call/weekend/after hours pager

## 2021-01-01 NOTE — Progress Notes (Addendum)
Physical Therapy Evaluation Patient Details Name: Jason Hartman MRN: [id number] DOB: 05/03/1935 Today's Date: 01/01/2021  History of Present Illness  Jason Hartman is a 85 y.o. male presenting with fall.  He was in [facility] for deconditioning after a severe UTI.  He was attempting to transfer from wheelchair to bed without assistance when he fell and landed on his hip.  Pt with left femoral neck fracture with left hemiarthroplasty on 9/24.  PMH:  HTN; OSA; RLS; and DM  Clinical Impression  Pt admitted with above diagnosis. Pt was only able to perform bed level exercises due to pain. Pt screaming when touched. Will have +2 assist at next visit to try and incr mobility. Did confirm with MD that pt has anterior hip precautions. Will follow acutely.  Pt currently with functional limitations due to the deficits listed below (see PT Problem List). Pt will benefit from skilled PT to increase their independence and safety with mobility to allow discharge to the venue listed below.          Recommendations for follow up therapy are one component of a multi-disciplinary discharge planning process, led by the attending physician.  Recommendations may be updated based on patient status, additional functional criteria and insurance authorization.  Follow Up Recommendations SNF    Equipment Recommendations  Other (comment) (TBA)    Recommendations for Other Services       Precautions / Restrictions Precautions Precautions: Anterior Hip per Dr. Sammuel Hines Precaution Comments: Discussed precautions Restrictions Weight Bearing Restrictions: Yes LLE Weight Bearing: Weight bearing as tolerated      Mobility  Bed Mobility               General bed mobility comments: Pt refused to come to eOB.  Bed level eval. Attempted to sit pt to long sit with HOB raised and pt could pull forward a bit in sitting.    Transfers                    Ambulation/Gait                Stairs             Wheelchair Mobility    Modified Rankin (Stroke Patients Only)       Balance                                             Pertinent Vitals/Pain Pain Assessment: Faces Faces Pain Scale: Hurts worst Pain Location: left hip/leg Pain Descriptors / Indicators: Aching;Discomfort;Grimacing;Guarding Pain Intervention(s): Limited activity within patient's tolerance;Monitored during session;Repositioned    Home Living Family/patient expects to be discharged to:: Skilled nursing facility                 Additional Comments: [place]    Prior Function Level of Independence: Needs assistance   Gait / Transfers Assistance Needed: Pt reports he requires +2-3 people to take steps to chair normally at SNF. Has worked with PT some as well on ambulation.  Pt needed assist to get into wheelchair but could propel himself per pt  ADL's / Homemaking Assistance Needed: Required assist for ADLs.  Comments: chart says pt was in [facility] for rehab.     Hand Dominance   Dominant Hand: Right    Extremity/Trunk Assessment   Upper Extremity Assessment Upper Extremity Assessment: Defer  to OT evaluation    Lower Extremity Assessment Lower Extremity Assessment: LLE deficits/detail LLE: Unable to fully assess due to pain       Communication   Communication: No difficulties  Cognition Arousal/Alertness: Awake/alert Behavior During Therapy: Anxious Overall Cognitive Status: No family/caregiver present to determine baseline cognitive functioning                                        General Comments      Exercises General Exercises - Lower Extremity Ankle Circles/Pumps: AROM;Both;10 reps;Supine Quad Sets: AROM;Both;10 reps;Supine Heel Slides: AAROM;Both;5 reps;Supine   Assessment/Plan    PT Assessment Patient needs continued PT services  PT Problem List Decreased activity tolerance;Decreased balance;Decreased  mobility;Decreased strength;Decreased range of motion;Decreased safety awareness;Decreased knowledge of use of DME;Decreased knowledge of precautions;Pain       PT Treatment Interventions DME instruction;Gait training;Functional mobility training;Therapeutic activities;Therapeutic exercise;Balance training;Patient/family education    PT Goals (Current goals can be found in the Care Plan section)  Acute Rehab PT Goals Patient Stated Goal: to get rid of pain PT Goal Formulation: Patient unable to participate in goal setting Time For Goal Achievement: 01/15/21 Potential to Achieve Goals: Good    Frequency Min 3X/week   Barriers to discharge Decreased caregiver support      Co-evaluation               AM-PAC PT "6 Clicks" Mobility  Outcome Measure Help needed turning from your back to your side while in a flat bed without using bedrails?: Total Help needed moving from lying on your back to sitting on the side of a flat bed without using bedrails?: Total Help needed moving to and from a bed to a chair (including a wheelchair)?: Total Help needed standing up from a chair using your arms (e.g., wheelchair or bedside chair)?: Total Help needed to walk in hospital room?: Total Help needed climbing 3-5 steps with a railing? : Total 6 Click Score: 6    End of Session Equipment Utilized During Treatment: Gait belt Activity Tolerance: Patient limited by fatigue;Patient limited by pain Patient left: in bed;with call bell/phone within reach;with bed alarm set Nurse Communication: Mobility status;Need for lift equipment PT Visit Diagnosis: Unsteadiness on feet (R26.81);Muscle weakness (generalized) (M62.81);Pain Pain - Right/Left: Left Pain - part of body: Hip    Time: 5852-7782 PT Time Calculation (min) (ACUTE ONLY): 17 min   Charges:   PT Evaluation $PT Eval Moderate Complexity: 1 Mod          Treyshaun Keatts M,PT Acute Rehab Services [phone] (pager)   Alvira Philips 01/01/2021, 3:53 PM

## 2021-01-01 NOTE — Consult Note (Signed)
   [facility] Inpatient Consult   01/01/2021  ZURICH CARRENO [birth year] [id number]  [facility] [ACO] Patient: Medicare CMS DCE  Primary Care Provider:  [name], MD, [facility] Medicine   Patient screened for hospitalization with noted extreme high risk score for unplanned readmission risk . Review of patient's medical record reveals patient is being recommended to a skilled nursing facility for rehab.  Plan:  Continue to follow progress and disposition to assess for post hospital care management needs.  Can alert [facility] RN Care Coordinator if going to a [facility] affiliated facility.  For questions contact:   [name], RN BSN [facility] Liaison  [phone] business mobile phone Toll free office [phone]  Fax number: [phone] [email or web address]

## 2021-01-01 NOTE — Progress Notes (Signed)
ANTICOAGULATION CONSULT NOTE - Consult  Pharmacy Consult for Apixiaban Indication: atrial fibrillation  Allergies  Allergen Reactions   Oxycodone Anaphylaxis   Iodinated Diagnostic Agents Hives    alleric to renografin,isovue & omnipaque, hives, requires 13 hr prep//a.calhoun, Onset Date: [id number]      Iodine Hives and Rash    alleric to renografin,isovue & omnipaque, hives, requires 13 hr prep//a.calhoun, Onset Date: [id number] allergic to renografin,isovue & omnipaque, hives, requires 13 hr prep//a.calhoun, Onset Date: [id number]   Linezolid Hives and Rash        Methadone Hcl Other (See Comments)    hallucinations    Promethazine Other (See Comments)    Delirium (pulled out IV), erratic behavior Mental status change     Isovue [Iopamidol] Hives   Morphine Sulfate Other (See Comments)    Unknown reaction   Omnipaque [Iohexol] Hives    Code: HIVES, Desc: alleric to renografin,isovue & omnipaque, hives, requires 13 hr prep//a.calhoun, Onset Date: [id number]    Quetiapine Other (See Comments)    Unknown reaction   Renografin [Diatrizoate] Hives   Statins Other (See Comments)    Muscle weakness   Zolpidem Other (See Comments)    Erratic behavior/delusions Altered mental status   Atorvastatin Itching and Other (See Comments)    Tired, weakness    Carbidopa-Levodopa Anxiety   Chlorhexidine Gluconate [Chlorhexidine] Hives and Rash   Clindamycin Rash   Colesevelam Other (See Comments)    tired    Doxazosin Rash   Lovastatin Other (See Comments)    Tired, nervousness    Rosuvastatin Other (See Comments)    Tired, weakness     Patient Measurements: Height: 5\' 10"  (177.8 cm) Weight: 68 kg (150 lb) IBW/kg (Calculated) : 73  Vital Signs: Temp: 97.5 F (36.4 C) (09/26 1518) Temp Source: Oral (09/26 1518) BP: 125/69 (09/26 1518) Pulse Rate: 98 (09/26 1518)  Labs: Recent Labs    12/30/20 0431 12/31/20 0352  HGB 11.5* 10.4*  HCT 35.7* 31.3*  PLT 225 233   CREATININE 1.09 1.22    Estimated Creatinine Clearance: 44.9 mL/min (by C-G formula based on SCr of 1.22 mg/dL).   Medical History: Past Medical History:  Diagnosis Date   CAD (coronary artery disease)    s/p stents   Cellulitis and abscess of leg, except foot    Hypertension    Obstructive sleep apnea (adult) (pediatric)    Restless legs syndrome (RLS)    Thoracic or lumbosacral neuritis or radiculitis, unspecified    Type II or unspecified type diabetes mellitus without mention of complication, not stated as uncontrolled    Unspecified disease of pericardium    Unspecified hereditary and idiopathic peripheral neuropathy     Medications:  Scheduled:   amiodarone  200 mg Oral Daily   amitriptyline  10 mg Oral QHS   clonazePAM  0.5 mg Oral BID   docusate sodium  100 mg Oral BID   enoxaparin (LOVENOX) injection  40 mg Subcutaneous Q24H   feeding supplement  237 mL Oral BID BM   gabapentin  300 mg Oral QHS   insulin aspart  0-15 Units Subcutaneous TID WC   insulin aspart  0-5 Units Subcutaneous QHS   insulin glargine-yfgn  8 Units Subcutaneous BID   melatonin  5 mg Oral QHS   metoprolol tartrate  12.5 mg Oral BID   multivitamin with minerals  1 tablet Oral Daily   mupirocin ointment  1 application Nasal BID   polyethylene glycol  17 g Oral BID  Assessment: 85 y.o. male with medical history significant of HTN; OSA; RLS; and DM presenting with fall. Now s/p L hemiarthroplasty (9/24). Apixaban was held for procedure, pharmacy is now consulted to resume apixaban.   Goal of Therapy:  Monitor platelets by anticoagulation protocol: Yes   Plan:  Stop enoxaparin, last dose 9/26 AM Start apixaban 5mg  twice daily beginning 9/27 AM Continue to monitor H&H and platelets   Thank you for allowing pharmacy to be a part of this patient's care.  Ardyth Harps, PharmD Clinical Pharmacist

## 2021-01-01 NOTE — Discharge Instructions (Signed)
     Discharge Instructions    Attending Surgeon: Vanetta Mulders, MD Office Phone Number: [phone]   Diagnosis and Procedures:    Surgeries Performed: Left hip hemiarthroplasty  Discharge Plan:    Diet: Resume usual diet. Begin with light or bland foods.  Drink plenty of fluids.  Activity:  Weight bearing as tolerated. You may begin activity as tolerated with anterior hip precautions  GENERAL INSTRUCTIONS: 1.  Keep your surgical site elevated above your heart for at least 5-7 days or longer to prevent swelling. This will improve your comfort and your overall recovery following surgery.     2. Please call Dr. Eddie Dibbles office at [phone] with questions Monday-Friday during business hours. If no one answers, please leave a message and someone should get back to the patient within 24 hours. For emergencies please call 911 or proceed to the emergency room.   3. Patient to notify surgical team if experiences any of the following: Bowel/Bladder dysfunction, uncontrolled pain, nerve/muscle weakness, incision with increased drainage or redness, nausea/vomiting and Fever greater than 101.0 F.  Be alert for signs of infection including redness, streaking, odor, fever or chills. Be alert for excessive pain or bleeding and notify your surgeon immediately.  WOUND INSTRUCTIONS:   Leave your dressing/cast/splint in place until your post operative visit.  Keep it clean and dry.  Always keep the incision clean and dry until the staples/sutures are removed. If there is no drainage from the incision you should keep it open to air. If there is drainage from the incision you must keep it covered at all times until the drainage stops  Do not soak in a bath tub, hot tub, pool, lake or other body of water until 21 days after your surgery and your incision is completely dry and healed.  If you have removable sutures (or staples) they must be removed 10-14 days (unless otherwise instructed) from  the day of your surgery.     1)  Elevate the extremity as much as possible.  2)  Keep the dressing clean and dry.  3)  Please call us if the dressing becomes wet or dirty.  4)  If you are experiencing worsening pain or worsening swelling, please call.     MEDICATIONS: Resume all previous home medications at the previous prescribed dose and frequency unless otherwise noted Start taking the  pain medications on an as-needed basis as prescribed  Please taper down pain medication over the next week following surgery.  Ideally you should not require a refill of any narcotic pain medication.  Take pain medication with food to minimize nausea. In addition to the prescribed pain medication, you may take over-the-counter pain relievers such as Tylenol.  Do NOT take additional tylenol if your pain medication already has tylenol in it.  Resume home Eliquis      FOLLOWUP INSTRUCTIONS: 1. Follow up at the Physical Therapy Clinic 3-4 days following surgery. This appointment should be scheduled unless other arrangements have been made.The Physical Therapy scheduling number is [phone] if an appointment has not already been arranged.  2. Contact Dr. Eddie Dibbles office during office hours at [phone] or the practice after hours line at [phone] for non-emergencies. For medical emergencies call 911.

## 2021-01-01 NOTE — Progress Notes (Addendum)
PROGRESS NOTE    Jason Hartman  TKW:[id number] DOB: 05-Mar-1936 DOA: 12/29/2020 PCP: [name], MD   Chief Complaint  Patient presents with   Hip Pain   Brief Narrative Jason Hartman is Archibald Marchetta 85 y.o. male with medical history significant of HTN; OSA; RLS; and DM presenting with fall.  He is in [facility] for deconditioning after Barbra Miner severe UTI.  He was attempting to transfer from wheelchair to bed without assistance when he fell and landed on his hip.  This happened 2-3 days ago and he thought it would get better but when he still couldn't bear weight he decided to come in.    Assessment & Plan:   Principal Problem:   Hip fracture ([place]) Active Problems:   Type II diabetes mellitus ([place])   OBSTRUCTIVE SLEEP APNEA   Essential hypertension   Atherosclerotic heart disease of native coronary artery without angina pectoris   Anxiety   PAF (paroxysmal atrial fibrillation) ([facility])  Hip fracture -Mechanical fall resulting in hip fracture -L hip CT with mildly impacted subcapital L femoral neck fracture -plain films of L hand with severe OA, no acute abnormality. Plain films of shoulder with degenerative joint disease, no acute abnormality.  Negative L elbow.   -Orthopedics consulted, s/p L hemiarthroplasty 9/24  - WBAT -resume eliquis today -Pain control with Robxain, Vicodin, and dilaudid prn  Testicular pain - [place] without torsion - 5 cm multiseptated cystic abnormality in the R testicle most c/w with benign abnormality like tubular ectasia of rete teste.  Probable small right hydrocele. - discussed with urology, will follow renal US    CAD -s/p stents -EKG pending - sinus, first degree AV block, mildly prolonged QTc -No c/o chest pain   Afib -Continue Amiodarone, Lopressor for rate control -resume Qtc tomorrow -> repeat EKG   Mood d/o  Delirium -?mild cognitive impairment -Continue Elavil, Cymbalta, melatonin   HTN -Continue Lopressor   DM -A1c 7.2 -Continue  Lantus -Hold Glucophage, Ozempic -Cover with moderate-scale SSI  -Continue Neurontin   OSA -Continue CPAP  DVT prophylaxis: SCD Code Status:full  Family Communication: called wife, no answer 9/25 Disposition:   Status is: Inpatient  Remains inpatient appropriate because:Inpatient level of care appropriate due to severity of illness  Dispo: The patient is [name]              Anticipated d/c is to:  pending              Patient currently is not medically stable to d/c.   Difficult to place patient No       Consultants:  orthopedics  Procedures: 9/24  Left hip hemiarthroplasty  Antimicrobials: Anti-infectives (From admission, onward)    Start     Dose/Rate Route Frequency Ordered Stop   12/30/20 0945  ceFAZolin (ANCEF) IVPB 2g/100 mL premix        2 g 200 mL/hr over 30 Minutes Intravenous On call to O.R. 12/30/20 0849 12/30/20 1036          Subjective: C/o testicular pain Anxious  Objective: Vitals:   12/31/20 0635 12/31/20 1945 01/01/21 0728 01/01/21 1518  BP: 120/66 132/60 128/68 125/69  Pulse: 95 88 96 98  Resp: 16 20 16 16   Temp: 98.7 F (37.1 C) 98.6 F (37 C) 98.7 F (37.1 C) (!) 97.5 F (36.4 C)  TempSrc: Oral Oral Oral Oral  SpO2: 98%  98% 99%  Weight:      Height:  Intake/Output Summary (Last 24 hours) at 01/01/2021 1708 Last data filed at 01/01/2021 0600 Gross per 24 hour  Intake 100 ml  Output 450 ml  Net -350 ml    Filed Weights   12/29/20 1249  Weight: 68 kg    Examination:  General: No acute distress. Cardiovascular: RRR Lungs: unlabored Abdomen: Soft, nontender, nondistended  GU palpable testes bilaterally, c/o pain with palpation Neurological: Alert and oriented 3. Moves all extremities 4 with equal strength. Cranial nerves II through XII grossly intact. Skin: Warm and dry. No rashes or lesions. Extremities: No clubbing or cyanosis. No edema.     Data Reviewed: I have personally reviewed  following labs and imaging studies  CBC: Recent Labs  Lab 12/29/20 1502 12/30/20 0431 12/31/20 0352  WBC 9.6 8.4 18.4*  NEUTROABS  --   --  15.9*  HGB 12.0* 11.5* 10.4*  HCT 35.9* 35.7* 31.3*  MCV 89.8 90.8 88.7  PLT 223 225 638    Basic Metabolic Panel: Recent Labs  Lab 12/29/20 1502 12/30/20 0431 12/31/20 0352  NA 136 137 132*  K 4.3 4.2 4.4  CL 101 100 98  CO2 26 30 23   GLUCOSE 132* 114* 183*  BUN 18 18 20   CREATININE 0.96 1.09 1.22  CALCIUM 8.9 9.1 8.7*  MG  --   --  1.5*  PHOS  --   --  3.5    GFR: Estimated Creatinine Clearance: 44.9 mL/min (by C-G formula based on SCr of 1.22 mg/dL).  Liver Function Tests: Recent Labs  Lab 12/31/20 0352  AST 20  ALT 10  ALKPHOS 68  BILITOT 1.2  PROT 6.4*  ALBUMIN 2.6*    CBG: Recent Labs  Lab 12/31/20 1730 12/31/20 2139 01/01/21 0933 01/01/21 1125 01/01/21 1607  GLUCAP 175* 165* 191* 209* 114*     Recent Results (from the past 240 hour(s))  Resp Panel by RT-PCR (Flu Markus Casten&B, Covid) Nasopharyngeal Swab     Status: None   Collection Time: 12/29/20  2:26 PM   Specimen: Nasopharyngeal Swab; Nasopharyngeal(NP) swabs in vial transport medium  Result Value Ref Range Status   SARS Coronavirus 2 by RT PCR NEGATIVE NEGATIVE Final    Comment: (NOTE) SARS-CoV-2 target nucleic acids are NOT DETECTED.  The SARS-CoV-2 RNA is generally detectable in upper respiratory specimens during the acute phase of infection. The lowest concentration of SARS-CoV-2 viral copies this assay can detect is 138 copies/mL. Jhoselin Crume negative result does not preclude SARS-Cov-2 infection and should not be used as the sole basis for treatment or other patient management decisions. Chayim Bialas negative result may occur with  improper specimen collection/handling, submission of specimen other than nasopharyngeal swab, presence of viral mutation(s) within the areas targeted by this assay, and inadequate number of viral copies(<138 copies/mL). Koven Belinsky negative result  must be combined with clinical observations, patient history, and epidemiological information. The expected result is Negative.  Fact Sheet for Patients:  EntrepreneurPulse.com.au  Fact Sheet for Healthcare Providers:  IncredibleEmployment.be  This test is no t yet approved or cleared by the [place] FDA and  has been authorized for detection and/or diagnosis of SARS-CoV-2 by FDA under an Emergency Use Authorization (EUA). This EUA will remain  in effect (meaning this test can be used) for the duration of the COVID-19 declaration under Section 564(b)(1) of the Act, 21 U.S.C.section 360bbb-3(b)(1), unless the authorization is terminated  or revoked sooner.       Influenza Tiffay Pinette by PCR NEGATIVE NEGATIVE Final   Influenza B by  PCR NEGATIVE NEGATIVE Final    Comment: (NOTE) The Xpert Xpress SARS-CoV-2/FLU/RSV plus assay is intended as an aid in the diagnosis of influenza from Nasopharyngeal swab specimens and should not be used as Shequita Peplinski sole basis for treatment. Nasal washings and aspirates are unacceptable for Xpert Xpress SARS-CoV-2/FLU/RSV testing.  Fact Sheet for Patients: EntrepreneurPulse.com.au  Fact Sheet for Healthcare Providers: IncredibleEmployment.be  This test is not yet approved or cleared by the [place] FDA and has been authorized for detection and/or diagnosis of SARS-CoV-2 by FDA under an Emergency Use Authorization (EUA). This EUA will remain in effect (meaning this test can be used) for the duration of the COVID-19 declaration under Section 564(b)(1) of the Act, 21 U.S.C. section 360bbb-3(b)(1), unless the authorization is terminated or revoked.  Performed at [facility], [place] [address], [place], [place] [address]   Surgical pcr screen     Status: Abnormal   Collection Time: 12/29/20 11:11 PM   Specimen: Nasal Mucosa; Nasal Swab  Result Value Ref Range Status   MRSA, PCR  POSITIVE (Aasir Daigler) NEGATIVE Corrected    Comment: RESULT CALLED TO, READ BACK BY AND VERIFIED WITH: RN SYLVIA M. 12/30/20@1 :00 BY TW    Staphylococcus aureus POSITIVE (Kyelle Urbas) NEGATIVE Corrected    Comment: (NOTE) The Xpert SA Assay (FDA approved for NASAL specimens in patients 28 years of age and older), is one component of Channing Yeager comprehensive surveillance program. It is not intended to diagnose infection nor to guide or monitor treatment. Performed at [facility], [place] [address], [place], [place] [address] CORRECTED ON 09/24 AT 0114: PREVIOUSLY REPORTED AS POSITIVE          Radiology Studies: US SCROTUM W/DOPPLER  Result Date: 12/31/2020 CLINICAL DATA:  Scrotal pain. EXAM: SCROTAL ULTRASOUND DOPPLER ULTRASOUND OF THE TESTICLES TECHNIQUE: Complete ultrasound examination of the testicles, epididymis, and other scrotal structures was performed. Color and spectral Doppler ultrasound were also utilized to evaluate blood flow to the testicles. COMPARISON:  None. FINDINGS: Right testicle Measurements: 3.1 x 2.3 x 1.6 cm. Multi-septated cystic area is noted in inferior portion of right testicle which measures 5.0 x 1.3 x 1.2 cm. This most likely represents benign abnormality such as tubular ectasia of rete testes, but follow-up ultrasound is recommended to ensure stability. Left testicle Measurements: 0.6 x 2.2 x 1.4 cm. No mass or microlithiasis visualized. Right epididymis:  Normal in size and appearance. Left epididymis:  Normal in size and appearance. Hydrocele:  Small right hydrocele is noted. Varicocele:  None visualized. Pulsed Doppler interrogation of both testes demonstrates normal low resistance arterial and venous waveforms bilaterally. IMPRESSION: No evidence of testicular torsion. 5 cm multi-septated cystic abnormality seen in the right testicle most consistent with benign abnormality such as tubular ectasia of rete testes, but follow-up ultrasound in 3 months is recommended to ensure stability  and to rule out mass. Probable small right hydrocele. Electronically Signed   By: Marijo Conception M.D.   On: 12/31/2020 18:30        Scheduled Meds:  amiodarone  200 mg Oral Daily   amitriptyline  10 mg Oral QHS   clonazePAM  0.5 mg Oral BID   docusate sodium  100 mg Oral BID   enoxaparin (LOVENOX) injection  40 mg Subcutaneous Q24H   feeding supplement  237 mL Oral BID BM   gabapentin  300 mg Oral QHS   insulin aspart  0-15 Units Subcutaneous TID WC   insulin aspart  0-5 Units Subcutaneous QHS  insulin glargine-yfgn  8 Units Subcutaneous BID   melatonin  5 mg Oral QHS   metoprolol tartrate  12.5 mg Oral BID   multivitamin with minerals  1 tablet Oral Daily   mupirocin ointment  1 application Nasal BID   polyethylene glycol  17 g Oral BID   Continuous Infusions:  methocarbamol (ROBAXIN) IV       LOS: 3 days    Time spent: over 30 min    Fayrene Helper, MD [facility]   To contact the attending provider between 7A-7P or the covering provider during after hours 7P-7A, please log into the web site [email or web address] and access using universal [place] password for that web site. If you do not have the password, please call the hospital operator.  01/01/2021, 5:08 PM

## 2021-01-01 NOTE — Progress Notes (Signed)
   Subjective: Patient reports better control this.  Pending eval with physical therapy.   Objective:   VITALS:   Vitals:   12/30/20 2200 12/31/20 0040 12/31/20 0635 12/31/20 1945  BP: (!) 158/79 140/78 120/66 132/60  Pulse: 98 (!) 105 95 88  Resp: 18 16 16 20   Temp: 98.4 F (36.9 C) 98.6 F (37 C) 98.7 F (37.1 C) 98.6 F (37 C)  TempSrc: Oral Oral Oral Oral  SpO2:   98%   Weight:      Height:       Sitting with legs together comfortably in bed.  Dressing is clean dry and intact.  He is able to fire all of the extensors of his left toes as well as flexors.  Sensation is intact in all distributions of the left foot and equal to the contralateral side.  Toes are warm and well-perfused  Lab Results  Component Value Date   WBC 18.4 (H) 12/31/2020   HGB 10.4 (L) 12/31/2020   HCT 31.3 (L) 12/31/2020   MCV 88.7 12/31/2020   PLT 233 12/31/2020     Assessment/Plan:  2 Days Post-Op   - Expected postop acute blood loss anemia - will monitor for symptoms - Patient to work with PT/OT to optimize mobilization safely - DVT ppx - SCDs, ambulation, may discontinue Lovenox and resume home Eliquis - WBAT operative extremity - Pain control - multimodal pain management, ATC acetaminophen in conjunction with as needed narcotic (oxycodone), although this should be minimized with other modalities.  Added additional breakthrough Dilaudid as he is in significant pain - Discharge planning pending better pain control as well as mobility assessment   Kynesha Guerin 01/01/2021, 7:23 AM

## 2021-01-02 DIAGNOSIS — S72002A Fracture of unspecified part of neck of left femur, initial encounter for closed fracture: Secondary | ICD-10-CM | POA: Diagnosis not present

## 2021-01-02 LAB — COMPREHENSIVE METABOLIC PANEL
ALT: 11 U/L (ref 0–44)
AST: 16 U/L (ref 15–41)
Albumin: 2.2 g/dL — ABNORMAL LOW (ref 3.5–5.0)
Alkaline Phosphatase: 81 U/L (ref 38–126)
Anion gap: 9 (ref 5–15)
BUN: 25 mg/dL — ABNORMAL HIGH (ref 8–23)
CO2: 25 mmol/L (ref 22–32)
Calcium: 8.7 mg/dL — ABNORMAL LOW (ref 8.9–10.3)
Chloride: 94 mmol/L — ABNORMAL LOW (ref 98–111)
Creatinine, Ser: 1.12 mg/dL (ref 0.61–1.24)
GFR, Estimated: >60 mL/min (ref >60)
Glucose, Bld: 130 mg/dL — ABNORMAL HIGH (ref 70–99)
Potassium: 4.2 mmol/L (ref 3.5–5.1)
Sodium: 128 mmol/L — ABNORMAL LOW (ref 135–145)
Total Bilirubin: 0.9 mg/dL (ref 0.3–1.2)
Total Protein: 6.3 g/dL — ABNORMAL LOW (ref 6.5–8.1)

## 2021-01-02 LAB — CBC WITH DIFFERENTIAL/PLATELET
Abs Immature Granulocytes: 0.1 10*3/uL — ABNORMAL HIGH (ref 0.00–0.07)
Basophils Absolute: 0 10*3/uL (ref 0.0–0.1)
Basophils Relative: 0 %
Eosinophils Absolute: 0 10*3/uL (ref 0.0–0.5)
Eosinophils Relative: 0 %
HCT: 29.9 % — ABNORMAL LOW (ref 39.0–52.0)
Hemoglobin: 9.7 g/dL — ABNORMAL LOW (ref 13.0–17.0)
Immature Granulocytes: 1 %
Lymphocytes Relative: 9 %
Lymphs Abs: 1.4 10*3/uL (ref 0.7–4.0)
MCH: 29 pg (ref 26.0–34.0)
MCHC: 32.4 g/dL (ref 30.0–36.0)
MCV: 89.3 fL (ref 80.0–100.0)
Monocytes Absolute: 1 10*3/uL (ref 0.1–1.0)
Monocytes Relative: 7 %
Neutro Abs: 12.8 10*3/uL — ABNORMAL HIGH (ref 1.7–7.7)
Neutrophils Relative %: 83 %
Platelets: 192 10*3/uL (ref 150–400)
RBC: 3.35 MIL/uL — ABNORMAL LOW (ref 4.22–5.81)
RDW: 14.1 % (ref 11.5–15.5)
WBC: 15.4 10*3/uL — ABNORMAL HIGH (ref 4.0–10.5)
nRBC: 0 % (ref 0.0–0.2)

## 2021-01-02 LAB — GLUCOSE, CAPILLARY
Glucose-Capillary: 206 mg/dL — ABNORMAL HIGH (ref 70–99)
Glucose-Capillary: 270 mg/dL — ABNORMAL HIGH (ref 70–99)
Glucose-Capillary: 209 mg/dL — ABNORMAL HIGH (ref 70–99)
Glucose-Capillary: 188 mg/dL — ABNORMAL HIGH (ref 70–99)

## 2021-01-02 LAB — SODIUM, URINE, RANDOM: Sodium, Ur: 24 mmol/L

## 2021-01-02 LAB — MAGNESIUM: Magnesium: 2 mg/dL (ref 1.7–2.4)

## 2021-01-02 LAB — PHOSPHORUS: Phosphorus: 3.7 mg/dL (ref 2.5–4.6)

## 2021-01-02 LAB — OSMOLALITY: Osmolality: 286 mOsm/kg (ref 275–295)

## 2021-01-02 MED ORDER — SODIUM CHLORIDE 0.9 % IV SOLN: INTRAVENOUS | Status: DC

## 2021-01-02 MED ORDER — HYDROCODONE-ACETAMINOPHEN 5-325 MG PO TABS
1.0000 | ORAL_TABLET | Freq: Four times a day (QID) | ORAL | Status: DC | PRN
  Administered 2021-01-02 – 2021-01-03 (×2): 2 via ORAL
  Filled 2021-01-02 (×2): qty 2

## 2021-01-02 MED ORDER — LORAZEPAM 2 MG/ML IJ SOLN
0.5000 mg | Freq: Once | INTRAMUSCULAR | Status: AC
  Administered 2021-01-02: 0.5 mg via INTRAVENOUS
  Filled 2021-01-02: qty 1

## 2021-01-02 MED ORDER — HYDROCODONE-ACETAMINOPHEN 7.5-325 MG PO TABS: 2.0000 | ORAL_TABLET | Freq: Four times a day (QID) | ORAL | Status: DC | PRN

## 2021-01-02 MED ORDER — HYDROCODONE-ACETAMINOPHEN 7.5-325 MG PO TABS: 1.0000 | ORAL_TABLET | Freq: Four times a day (QID) | ORAL | Status: DC | PRN

## 2021-01-02 NOTE — Progress Notes (Addendum)
PROGRESS NOTE    Jason Hartman  KPT:[id number] DOB: [birth year] DOA: 12/29/2020 PCP: [name], MD   Chief Complaint  Patient presents with   Hip Pain   Brief Narrative Jason Hartman is Jason Hartman 85 y.o. male with medical history significant of HTN; OSA; RLS; and DM presenting with fall.  He is in [facility] for deconditioning after [name] severe UTI.  He was attempting to transfer from wheelchair to bed without assistance when he fell and landed on his hip.  This happened 2-3 days ago and he thought it would get better but when he still couldn't bear weight he decided to come in.    He presented with Franklyn Cafaro L hip fracture.  Now post op from surgery.    Post op course c/b delirium and difficulty with pain management.  See below for additional details  Assessment & Plan:   Principal Problem:   Hip fracture ([place]) Active Problems:   Type II diabetes mellitus ([place])   OBSTRUCTIVE SLEEP APNEA   Essential hypertension   Atherosclerotic heart disease of native coronary artery without angina pectoris   Anxiety   PAF (paroxysmal atrial fibrillation) (HCC)  Delirium  ? Mild cognitive impairment - delirium precautions - - pain has been difficult, often yelling out - pain control  Hip fracture -Mechanical fall resulting in hip fracture -L hip CT with mildly impacted subcapital L femoral neck fracture -plain films of L hand with severe OA, no acute abnormality. Plain films of shoulder with degenerative joint disease, no acute abnormality.  Negative L elbow.   -Orthopedics consulted, s/p L hemiarthroplasty 9/24  - WBAT -continue eliquis -Pain control with Robxain, Vicodin, and dilaudid prn  Hyponatremia - mild, follow with IVF - serum osm wnl  Testicular pain - [place] without torsion - 5 cm multiseptated cystic abnormality in the R testicle most c/w with benign abnormality like tubular ectasia of rete teste.  Probable small right hydrocele. - discussed with urology, appreciate recommendations -  recommending renal US (unremarkable) - follow with [place]   CAD -s/p stents -EKG pending - sinus, first degree AV block, mildly prolonged QTc -No c/o chest pain   Afib -Continue Amiodarone, Lopressor for rate control -resume Qtc tomorrow -> repeat EKG pending   Mood d/o  -Continue Elavil, Cymbalta, melatonin, clonazepam   HTN -Continue Lopressor   DM -A1c 7.2 -Continue Lantus -Hold Glucophage, Ozempic -Cover with moderate-scale SSI  -Continue Neurontin   OSA -Continue CPAP  DVT prophylaxis: SCD Code Status:full  Family Communication: called wife, no answer 9/25 Disposition:   Status is: Inpatient  Remains inpatient appropriate because:Inpatient level of care appropriate due to severity of illness  Dispo: The patient is [name]              Anticipated d/c is to:  pending              Patient currently is not medically stable to d/c.   Difficult to place patient No       Consultants:  orthopedics  Procedures: 9/24  Left hip hemiarthroplasty  Antimicrobials: Anti-infectives (From admission, onward)    Start     Dose/Rate Route Frequency Ordered Stop   12/30/20 0945  ceFAZolin (ANCEF) IVPB 2g/100 mL premix        2 g 200 mL/hr over 30 Minutes Intravenous On call to O.R. 12/30/20 0849 12/30/20 1036          Subjective: Continues to complain of pain  Objective: Vitals:   01/01/21 1518 01/01/21 2140 01/02/21 0757 01/02/21 1338  BP: 125/69 (!) 143/75 133/68 132/82  Pulse: 98  88 (!) 102  Resp: 16 20 17 17   Temp: (!) 97.5 F (36.4 C) 97.8 F (36.6 C) 98.8 F (37.1 C) 97.8 F (36.6 C)  TempSrc: Oral Oral Oral Oral  SpO2: 99%  98% 100%  Weight:      Height:        Intake/Output Summary (Last 24 hours) at 01/02/2021 1945 Last data filed at 01/02/2021 0334 Gross per 24 hour  Intake 100 ml  Output 400 ml  Net -300 ml    Filed Weights   12/29/20 1249  Weight: 68 kg    Examination:  General: No  acute distress. Cardiovascular: RRR Lungs: unlabored Abdomen: Soft, nontender, nondistended Neurological: Confused.  Moves all extremities 4 . Cranial nerves II through XII grossly intact. Skin: Warm and dry. No rashes or lesions. Extremities: LLE with intact dressing  Data Reviewed: I have personally reviewed following labs and imaging studies  CBC: Recent Labs  Lab 12/29/20 1502 12/30/20 0431 12/31/20 0352 01/02/21 0117  WBC 9.6 8.4 18.4* 15.4*  NEUTROABS  --   --  15.9* 12.8*  HGB 12.0* 11.5* 10.4* 9.7*  HCT 35.9* 35.7* 31.3* 29.9*  MCV 89.8 90.8 88.7 89.3  PLT 223 225 233 094    Basic Metabolic Panel: Recent Labs  Lab 12/29/20 1502 12/30/20 0431 12/31/20 0352 01/02/21 0117  NA 136 137 132* 128*  K 4.3 4.2 4.4 4.2  CL 101 100 98 94*  CO2 26 30 23 25   GLUCOSE 132* 114* 183* 130*  BUN 18 18 20  25*  CREATININE 0.96 1.09 1.22 1.12  CALCIUM 8.9 9.1 8.7* 8.7*  MG  --   --  1.5* 2.0  PHOS  --   --  3.5 3.7    GFR: Estimated Creatinine Clearance: 48.9 mL/min (by C-G formula based on SCr of 1.12 mg/dL).  Liver Function Tests: Recent Labs  Lab 12/31/20 0352 01/02/21 0117  AST 20 16  ALT 10 11  ALKPHOS 68 81  BILITOT 1.2 0.9  PROT 6.4* 6.3*  ALBUMIN 2.6* 2.2*    CBG: Recent Labs  Lab 01/01/21 1607 01/01/21 2147 01/02/21 0759 01/02/21 1121 01/02/21 1623  GLUCAP 114* 208* 206* 270* 209*     Recent Results (from the past 240 hour(s))  Resp Panel by RT-PCR (Flu Baxter Gonzalez&B, Covid) Nasopharyngeal Swab     Status: None   Collection Time: 12/29/20  2:26 PM   Specimen: Nasopharyngeal Swab; Nasopharyngeal(NP) swabs in vial transport medium  Result Value Ref Range Status   SARS Coronavirus 2 by RT PCR NEGATIVE NEGATIVE Final    Comment: (NOTE) SARS-CoV-2 target nucleic acids are NOT DETECTED.  The SARS-CoV-2 RNA is generally detectable in upper respiratory specimens during the acute phase of infection. The lowest concentration of SARS-CoV-2 viral copies this  assay can detect is 138 copies/mL. Gabriel Conry negative result does not preclude SARS-Cov-2 infection and should not be used as the sole basis for treatment or other patient management decisions. Nestor Wieneke negative result may occur with  improper specimen collection/handling, submission of specimen other than nasopharyngeal swab, presence of viral mutation(s) within the areas targeted by this assay, and inadequate number of viral copies(<138 copies/mL). Abdirahman Chittum negative result must be combined with clinical observations, patient history, and epidemiological information. The expected result is Negative.  Fact Sheet for Patients:  EntrepreneurPulse.com.au  Fact Sheet for Healthcare Providers:  IncredibleEmployment.be  This  test is no t yet approved or cleared by the [place] and  has been authorized for detection and/or diagnosis of SARS-CoV-2 by FDA under an Emergency Use Authorization (EUA). This EUA will remain  in effect (meaning this test can be used) for the duration of the COVID-19 declaration under Section 564(b)(1) of the Act, 21 U.S.C.section 360bbb-3(b)(1), unless the authorization is terminated  or revoked sooner.       Influenza Klint Lezcano by PCR NEGATIVE NEGATIVE Final   Influenza B by PCR NEGATIVE NEGATIVE Final    Comment: (NOTE) The Xpert Xpress SARS-CoV-2/FLU/RSV plus assay is intended as an aid in the diagnosis of influenza from Nasopharyngeal swab specimens and should not be used as Siegfried Vieth sole basis for treatment. Nasal washings and aspirates are unacceptable for Xpert Xpress SARS-CoV-2/FLU/RSV testing.  Fact Sheet for Patients: EntrepreneurPulse.com.au  Fact Sheet for Healthcare Providers: IncredibleEmployment.be  This test is not yet approved or cleared by the [place] FDA and has been authorized for detection and/or diagnosis of SARS-CoV-2 by FDA under an Emergency Use Authorization (EUA). This EUA will  remain in effect (meaning this test can be used) for the duration of the COVID-19 declaration under Section 564(b)(1) of the Act, 21 U.S.C. section 360bbb-3(b)(1), unless the authorization is terminated or revoked.  Performed at [facility], [place] [address], [place], [place] [address]   Surgical pcr screen     Status: Abnormal   Collection Time: 12/29/20 11:11 PM   Specimen: Nasal Mucosa; Nasal Swab  Result Value Ref Range Status   MRSA, PCR POSITIVE (Kyanne Rials) NEGATIVE Corrected    Comment: RESULT CALLED TO, READ BACK BY AND VERIFIED WITH: RN SYLVIA M. 12/30/20@1 :00 BY TW    Staphylococcus aureus POSITIVE (Raffaella Edison) NEGATIVE Corrected    Comment: (NOTE) The Xpert SA Assay (FDA approved for NASAL specimens in patients 35 years of age and older), is one component of [place] comprehensive surveillance program. It is not intended to diagnose infection nor to guide or monitor treatment. Performed at [facility], [place] [address], [place], [place] [address] CORRECTED ON 09/24 AT 0114: PREVIOUSLY REPORTED AS POSITIVE          Radiology Studies: US RENAL  Result Date: 01/01/2021 CLINICAL DATA:  Left-sided abdominal pain EXAM: RENAL / URINARY TRACT ULTRASOUND COMPLETE COMPARISON:  10/25/2020 FINDINGS: Right Kidney: Renal measurements: 12.0 x 6.6 x 6.5 cm = volume: 270.6 mL. Normal renal cortical echotexture. Multiple simple appearing cysts are identified largest measuring 3.3 cm. No hydronephrosis or nephrolithiasis. Left Kidney: Renal measurements: 11.8 x 7.5 x 7.0 cm = volume: 322.4 mL. Echogenicity within normal limits. No mass or hydronephrosis visualized. Bladder: Not well visualized. Other: Examination was terminated prematurely at the patient's request due to pain. IMPRESSION: 1. Simple right renal cysts. Otherwise unremarkable renal ultrasound. Electronically Signed   By: Randa Ngo M.D.   On: 01/01/2021 22:04        Scheduled Meds:  amiodarone  200 mg Oral Daily   amitriptyline   10 mg Oral QHS   apixaban  5 mg Oral BID   clonazePAM  0.5 mg Oral BID   docusate sodium  100 mg Oral BID   feeding supplement  237 mL Oral BID BM   gabapentin  300 mg Oral QHS   insulin aspart  0-15 Units Subcutaneous TID WC   insulin aspart  0-5 Units Subcutaneous QHS   insulin glargine-yfgn  8 Units Subcutaneous BID   melatonin  5 mg Oral QHS  metoprolol tartrate  12.5 mg Oral BID   multivitamin with minerals  1 tablet Oral Daily   mupirocin ointment  1 application Nasal BID   polyethylene glycol  17 g Oral BID   Continuous Infusions:  sodium chloride     methocarbamol (ROBAXIN) IV       LOS: 4 days    Time spent: over 30 min    Fayrene Helper, MD [facility]   To contact the attending provider between 7A-7P or the covering provider during after hours 7P-7A, please log into the web site [email or web address] and access using universal [place] password for that web site. If you do not have the password, please call the hospital operator.  01/02/2021, 7:45 PM

## 2021-01-02 NOTE — Progress Notes (Signed)
   Subjective: Patient reports better control this.  Difficulty mobilizing to end of bed. Currently undergoing workup for testicular pain.   Objective:   VITALS:   Vitals:   12/31/20 1945 01/01/21 0728 01/01/21 1518 01/01/21 2140  BP: 132/60 128/68 125/69 (!) 143/75  Pulse: 88 96 98   Resp: 20 16 16 20   Temp: 98.6 F (37 C) 98.7 F (37.1 C) (!) 97.5 F (36.4 C) 97.8 F (36.6 C)  TempSrc: Oral Oral Oral Oral  SpO2:  98% 99%   Weight:      Height:       Sitting with legs together comfortably in bed.  Dressing is clean dry and intact.  He is able to fire all of the extensors of his left toes as well as flexors.  Sensation is intact in all distributions of the left foot and equal to the contralateral side.  Toes are warm and well-perfused  Lab Results  Component Value Date   WBC 15.4 (H) 01/02/2021   HGB 9.7 (L) 01/02/2021   HCT 29.9 (L) 01/02/2021   MCV 89.3 01/02/2021   PLT 192 01/02/2021     Assessment/Plan:  3 Days Post-Op   - Expected postop acute blood loss anemia - will monitor for symptoms - Patient to work with PT/OT to optimize mobilization safely - DVT ppx - SCDs, ambulation, home eliquis - WBAT operative extremity - Pain control - multimodal pain management, ATC acetaminophen in conjunction with as needed narcotic (oxycodone), although this should be minimized with other modalities.  Added additional breakthrough Dilaudid as he is in significant pain   Jason Hartman 01/02/2021, 7:41 AM

## 2021-01-02 NOTE — Plan of Care (Signed)
  Problem: Education: Goal: Knowledge of General Education information will improve Description Including pain rating scale, medication(s)/side effects and non-pharmacologic comfort measures Outcome: Progressing   

## 2021-01-02 NOTE — Progress Notes (Signed)
Physical Therapy Treatment Patient Details Name: Jason Hartman MRN: [id number] DOB: October 29, 1935 Today's Date: 01/02/2021   History of Present Illness Jason Hartman is a 85 y.o. male presenting with fall.  He was in [facility] for deconditioning after a severe UTI.  He was attempting to transfer from wheelchair to bed without assistance when he fell and landed on his hip.  Pt with left femoral neck fracture with left hemiarthroplasty on 9/24.  PMH:  HTN; OSA; RLS; and DM    PT Comments    Pt admitted with above diagnosis. Assisted pt to the EOB with max to total assist as pt was resisting movement making it difficult for pt to sit. Assisted pt with max to total assist to sit on EOB 5 min with pt leaning posteriorly and c/o pain left hip.  Had to lie pt back down due to this. Called nursing regarding pain meds for pt.  Pt currently with functional limitations due to balance and endurance deficits. Pt will benefit from skilled PT to increase their independence and safety with mobility to allow discharge to the venue listed below.      Recommendations for follow up therapy are one component of a multi-disciplinary discharge planning process, led by the attending physician.  Recommendations may be updated based on patient status, additional functional criteria and insurance authorization.  Follow Up Recommendations  SNF     Equipment Recommendations  Other (comment) (TBA)    Recommendations for Other Services       Precautions / Restrictions Precautions Precautions: Anterior Hip Precaution Booklet Issued: Yes (comment) Precaution Comments: Discussed precautions Restrictions Weight Bearing Restrictions: Yes LLE Weight Bearing: Weight bearing as tolerated     Mobility  Bed Mobility Overal bed mobility: Needs Assistance Bed Mobility: Supine to Sit;Sit to Supine     Supine to sit: Max assist;+2 for physical assistance;HOB elevated;Total assist Sit to supine: Total assist;+2 for  physical assistance   General bed mobility comments: Pt assisted to EOB with max to total assist of 2 persons. Pt total most of time as he leans posteriorly and resists movement and did not want to come to EOB. Pt would not move LEs to command to intiiate movement therefore PT performed all the movement.    Transfers                    Ambulation/Gait                 Stairs             Wheelchair Mobility    Modified Rankin (Stroke Patients Only)       Balance Overall balance assessment: Needs assistance Sitting-balance support: Bilateral upper extremity supported;Feet supported Sitting balance-Leahy Scale: Zero Sitting balance - Comments: Pt sat eOB 5 min with max to total assist to sit as pt leans posteriorly significantly and could not get pts feet on floor as he resists sitting due to left hip pain.                                    Cognition Arousal/Alertness: Awake/alert Behavior During Therapy: Anxious Overall Cognitive Status: No family/caregiver present to determine baseline cognitive functioning  Exercises General Exercises - Lower Extremity Ankle Circles/Pumps: AROM;Both;10 reps;Supine Quad Sets: AROM;Both;10 reps;Supine Heel Slides: AAROM;Both;5 reps;Supine    General Comments        Pertinent Vitals/Pain Pain Assessment: Faces Faces Pain Scale: Hurts worst Pain Location: left hip/leg Pain Descriptors / Indicators: Aching;Discomfort;Grimacing;Guarding Pain Intervention(s): Limited activity within patient's tolerance;Monitored during session;Repositioned;Patient requesting pain meds-RN notified    Home Living                      Prior Function            PT Goals (current goals can now be found in the care plan section) Acute Rehab PT Goals Patient Stated Goal: to get rid of pain Progress towards PT goals: Not progressing toward goals - comment  (Self limiting and with pain)    Frequency    Min 3X/week      PT Plan Current plan remains appropriate    Co-evaluation              AM-PAC PT "6 Clicks" Mobility   Outcome Measure  Help needed turning from your back to your side while in a flat bed without using bedrails?: Total Help needed moving from lying on your back to sitting on the side of a flat bed without using bedrails?: Total Help needed moving to and from a bed to a chair (including a wheelchair)?: Total Help needed standing up from a chair using your arms (e.g., wheelchair or bedside chair)?: Total Help needed to walk in hospital room?: Total Help needed climbing 3-5 steps with a railing? : Total 6 Click Score: 6    End of Session Equipment Utilized During Treatment: Gait belt Activity Tolerance: Patient limited by fatigue;Patient limited by pain Patient left: in bed;with call bell/phone within reach;with bed alarm set Nurse Communication: Mobility status;Need for lift equipment PT Visit Diagnosis: Unsteadiness on feet (R26.81);Muscle weakness (generalized) (M62.81);Pain Pain - Right/Left: Left Pain - part of body: Hip     Time: 1020-1041 PT Time Calculation (min) (ACUTE ONLY): 21 min  Charges:  $Therapeutic Activity: 8-22 mins                     Jason Hartman M,PT Acute Rehab Services [phone] (pager)    Jason Hartman 01/02/2021, 12:04 PM

## 2021-01-03 DIAGNOSIS — F419 Anxiety disorder, unspecified: Secondary | ICD-10-CM | POA: Diagnosis not present

## 2021-01-03 DIAGNOSIS — I2510 Atherosclerotic heart disease of native coronary artery without angina pectoris: Secondary | ICD-10-CM | POA: Diagnosis not present

## 2021-01-03 DIAGNOSIS — S72002G Fracture of unspecified part of neck of left femur, subsequent encounter for closed fracture with delayed healing: Secondary | ICD-10-CM

## 2021-01-03 DIAGNOSIS — E1122 Type 2 diabetes mellitus with diabetic chronic kidney disease: Secondary | ICD-10-CM

## 2021-01-03 DIAGNOSIS — G4733 Obstructive sleep apnea (adult) (pediatric): Secondary | ICD-10-CM | POA: Diagnosis not present

## 2021-01-03 DIAGNOSIS — N1831 Chronic kidney disease, stage 3a: Secondary | ICD-10-CM

## 2021-01-03 DIAGNOSIS — Z794 Long term (current) use of insulin: Secondary | ICD-10-CM

## 2021-01-03 LAB — COMPREHENSIVE METABOLIC PANEL
ALT: 18 U/L (ref 0–44)
AST: 28 U/L (ref 15–41)
Albumin: 1.9 g/dL — ABNORMAL LOW (ref 3.5–5.0)
Alkaline Phosphatase: 88 U/L (ref 38–126)
Anion gap: 7 (ref 5–15)
BUN: 22 mg/dL (ref 8–23)
CO2: 26 mmol/L (ref 22–32)
Calcium: 8.3 mg/dL — ABNORMAL LOW (ref 8.9–10.3)
Chloride: 95 mmol/L — ABNORMAL LOW (ref 98–111)
Creatinine, Ser: 1.09 mg/dL (ref 0.61–1.24)
GFR, Estimated: >60 mL/min (ref >60)
Glucose, Bld: 169 mg/dL — ABNORMAL HIGH (ref 70–99)
Potassium: 3.7 mmol/L (ref 3.5–5.1)
Sodium: 128 mmol/L — ABNORMAL LOW (ref 135–145)
Total Bilirubin: 0.9 mg/dL (ref 0.3–1.2)
Total Protein: 5.9 g/dL — ABNORMAL LOW (ref 6.5–8.1)

## 2021-01-03 LAB — CBC WITH DIFFERENTIAL/PLATELET
Abs Immature Granulocytes: 0.05 10*3/uL (ref 0.00–0.07)
Basophils Absolute: 0 10*3/uL (ref 0.0–0.1)
Basophils Relative: 0 %
Eosinophils Absolute: 0.2 10*3/uL (ref 0.0–0.5)
Eosinophils Relative: 1 %
HCT: 28.3 % — ABNORMAL LOW (ref 39.0–52.0)
Hemoglobin: 9.5 g/dL — ABNORMAL LOW (ref 13.0–17.0)
Immature Granulocytes: 0 %
Lymphocytes Relative: 14 %
Lymphs Abs: 1.7 10*3/uL (ref 0.7–4.0)
MCH: 29.6 pg (ref 26.0–34.0)
MCHC: 33.6 g/dL (ref 30.0–36.0)
MCV: 88.2 fL (ref 80.0–100.0)
Monocytes Absolute: 0.9 10*3/uL (ref 0.1–1.0)
Monocytes Relative: 8 %
Neutro Abs: 9.3 10*3/uL — ABNORMAL HIGH (ref 1.7–7.7)
Neutrophils Relative %: 77 %
Platelets: 211 10*3/uL (ref 150–400)
RBC: 3.21 MIL/uL — ABNORMAL LOW (ref 4.22–5.81)
RDW: 13.7 % (ref 11.5–15.5)
WBC: 12.1 10*3/uL — ABNORMAL HIGH (ref 4.0–10.5)
nRBC: 0 % (ref 0.0–0.2)

## 2021-01-03 LAB — GLUCOSE, CAPILLARY
Glucose-Capillary: 280 mg/dL — ABNORMAL HIGH (ref 70–99)
Glucose-Capillary: 222 mg/dL — ABNORMAL HIGH (ref 70–99)
Glucose-Capillary: 237 mg/dL — ABNORMAL HIGH (ref 70–99)
Glucose-Capillary: 228 mg/dL — ABNORMAL HIGH (ref 70–99)

## 2021-01-03 LAB — PHOSPHORUS: Phosphorus: 3.9 mg/dL (ref 2.5–4.6)

## 2021-01-03 LAB — MAGNESIUM: Magnesium: 2 mg/dL (ref 1.7–2.4)

## 2021-01-03 MED ORDER — INSULIN GLARGINE-YFGN 100 UNIT/ML ~~LOC~~ SOLN
10.0000 [IU] | Freq: Two times a day (BID) | SUBCUTANEOUS | Status: DC
  Administered 2021-01-03 – 2021-01-08 (×10): 10 [IU] via SUBCUTANEOUS
  Filled 2021-01-03 (×11): qty 0.1

## 2021-01-03 MED ORDER — RISPERIDONE 0.5 MG PO TABS
0.5000 mg | ORAL_TABLET | Freq: Every day | ORAL | Status: DC
  Administered 2021-01-03: 0.5 mg via ORAL
  Filled 2021-01-03: qty 1

## 2021-01-03 MED ORDER — IPRATROPIUM-ALBUTEROL 0.5-2.5 (3) MG/3ML IN SOLN: 3.0000 mL | RESPIRATORY_TRACT | Status: DC | PRN

## 2021-01-03 MED ORDER — HALOPERIDOL LACTATE 5 MG/ML IJ SOLN
2.0000 mg | Freq: Four times a day (QID) | INTRAMUSCULAR | Status: DC | PRN
  Administered 2021-01-04: 2 mg via INTRAMUSCULAR
  Filled 2021-01-03 (×3): qty 1

## 2021-01-03 MED ORDER — HYDROCODONE-ACETAMINOPHEN 5-325 MG PO TABS
1.0000 | ORAL_TABLET | ORAL | Status: DC | PRN
  Administered 2021-01-03 – 2021-01-05 (×6): 2 via ORAL
  Administered 2021-01-06: 1 via ORAL
  Administered 2021-01-06: 2 via ORAL
  Administered 2021-01-06: 1 via ORAL
  Administered 2021-01-07 – 2021-01-08 (×5): 2 via ORAL
  Administered 2021-01-08: 1 via ORAL
  Filled 2021-01-03: qty 2
  Filled 2021-01-03: qty 1
  Filled 2021-01-03: qty 2
  Filled 2021-01-03: qty 1
  Filled 2021-01-03 (×3): qty 2
  Filled 2021-01-03: qty 1
  Filled 2021-01-03 (×7): qty 2

## 2021-01-03 MED ORDER — PROSOURCE PLUS PO LIQD
30.0000 mL | Freq: Three times a day (TID) | ORAL | Status: DC
  Administered 2021-01-03 – 2021-01-08 (×12): 30 mL via ORAL
  Filled 2021-01-03 (×13): qty 30

## 2021-01-03 MED ORDER — HALOPERIDOL 1 MG PO TABS
2.0000 mg | ORAL_TABLET | Freq: Four times a day (QID) | ORAL | Status: DC | PRN
  Filled 2021-01-03: qty 2

## 2021-01-03 MED ORDER — TRAZODONE HCL 50 MG PO TABS: 50.0000 mg | ORAL_TABLET | Freq: Every evening | ORAL | Status: DC | PRN

## 2021-01-03 MED ORDER — SODIUM CHLORIDE 0.9 % IV SOLN: INTRAVENOUS | Status: AC

## 2021-01-03 NOTE — Progress Notes (Signed)
PROGRESS NOTE    Jason Hartman  GXQ:[id number] DOB: 08/15/1935 DOA: 12/29/2020 PCP: [name], MD   Brief Narrative:  85 year old with history of HTN, OSA, RLS and DM2 presented with fall from SNF.  He has been at [facility] for deconditioning after severe UTI.  He fell after attempting to transfer from wheelchair to bed landed on his hip and found to have left hip fracture.   Assessment & Plan:   Principal Problem:   Hip fracture ([place]) Active Problems:   Type II diabetes mellitus ([place])   OBSTRUCTIVE SLEEP APNEA   Essential hypertension   Atherosclerotic heart disease of native coronary artery without angina pectoris   Anxiety   PAF (paroxysmal atrial fibrillation) (HCC)   Delirium  ? Mild cognitive impairment -Delirium precautions in place. Add Risperdal 0.5mg  qhs. Haldol prn.    Left hip fracture status post left hemiarthroplasty 9/24 -Pain control, PT/OT - Continue home Eliquis.  Weightbearing as tolerated.   Hyponatremia -Continue gentle hydration   Right testicular pain due to multiseptated cyst; 5cm.  -Ultrasound-unremarkable.  Previous hospitalist spoke with neurology who recommends outpatient follow-up at [facility]. Follow up US in 3 months.    CAD status post PCI -Currently chest pain-free   Chronic atrial fibrillation -On amiodarone and Lopressor   Mood disorder -Elavil, Cymbalta, melatonin, clonazepam   HTN -Lopressor   Diabetes mellitus type 2, insulin-dependent Peripheral neuropathy -A1c 7.2 -Continue Lantus, sliding scale and Accu-Cheks - Continue Neurontin at bedtime   OSA -Continue CPAP    DVT prophylaxis: Eliquis  Code Status: Full  Family Communication:  Spoke with his wife.   Status is: Inpatient  Remains inpatient appropriate because:Inpatient level of care appropriate due to severity of illness  Dispo: The patient is from: Home              Anticipated d/c is to: SNF              Patient currently is not medically  stable to d/c.   Difficult to place patient No       Nutritional status  Nutrition Problem: Moderate Malnutrition Etiology: chronic illness (CAD)  Signs/Symptoms: moderate fat depletion, moderate muscle depletion, severe muscle depletion, percent weight loss Percent weight loss: 15 %  Interventions: Ensure Enlive (each supplement provides 350kcal and 20 grams of protein), MVI  Body mass index is 21.52 kg/m.           Subjective: Sitting up in his chair complaining of mild scrotal pain.   Review of Systems Otherwise negative except as per HPI, including: General: Denies fever, chills, night sweats or unintended weight loss. Resp: Denies cough, wheezing, shortness of breath. Cardiac: Denies chest pain, palpitations, orthopnea, paroxysmal nocturnal dyspnea. GI: Denies abdominal pain, nausea, vomiting, diarrhea or constipation GU: Denies dysuria, frequency, hesitancy or incontinence MS: Denies muscle aches, joint pain or swelling Neuro: Denies headache, neurologic deficits (focal weakness, numbness, tingling), abnormal gait Psych: Denies anxiety, depression, SI/HI/AVH Skin: Denies new rashes or lesions ID: Denies sick contacts, exotic exposures, travel  Examination:  General exam: Appears calm and comfortable  Respiratory system: Clear to auscultation. Respiratory effort normal. Cardiovascular system: S1 & S2 heard, RRR. No JVD, murmurs, rubs, gallops or clicks. No pedal edema. Gastrointestinal system: Abdomen is nondistended, soft and nontender. No organomegaly or masses felt. Normal bowel sounds heard. Central nervous system: Alert and oriented. No focal neurological deficits. Extremities: Symmetric 5 x 5 power. Skin: hip dressing place.  Psychiatry: Poor judgement and insight  Objective: Vitals:   01/02/21 0757 01/02/21 1338 01/02/21 2046 01/03/21 0732  BP: 133/68 132/82 121/71 (!) 146/77  Pulse: 88 (!) 102 (!) 104 89  Resp: 17 17 18 17   Temp: 98.8 F  (37.1 C) 97.8 F (36.6 C) 98.9 F (37.2 C) 98.1 F (36.7 C)  TempSrc: Oral Oral Oral Oral  SpO2: 98% 100% 100% 99%  Weight:      Height:        Intake/Output Summary (Last 24 hours) at 01/03/2021 0811 Last data filed at 01/02/2021 2050 Gross per 24 hour  Intake 120 ml  Output 500 ml  Net -380 ml   Filed Weights   12/29/20 1249  Weight: 68 kg     Data Reviewed:   CBC: Recent Labs  Lab 12/29/20 1502 12/30/20 0431 12/31/20 0352 01/02/21 0117 01/03/21 0352  WBC 9.6 8.4 18.4* 15.4* 12.1*  NEUTROABS  --   --  15.9* 12.8* 9.3*  HGB 12.0* 11.5* 10.4* 9.7* 9.5*  HCT 35.9* 35.7* 31.3* 29.9* 28.3*  MCV 89.8 90.8 88.7 89.3 88.2  PLT 223 225 233 192 620   Basic Metabolic Panel: Recent Labs  Lab 12/29/20 1502 12/30/20 0431 12/31/20 0352 01/02/21 0117 01/03/21 0352  NA 136 137 132* 128* 128*  K 4.3 4.2 4.4 4.2 3.7  CL 101 100 98 94* 95*  CO2 26 30 23 25 26   GLUCOSE 132* 114* 183* 130* 169*  BUN 18 18 20  25* 22  CREATININE 0.96 1.09 1.22 1.12 1.09  CALCIUM 8.9 9.1 8.7* 8.7* 8.3*  MG  --   --  1.5* 2.0 2.0  PHOS  --   --  3.5 3.7 3.9   GFR: Estimated Creatinine Clearance: 50.3 mL/min (by C-G formula based on SCr of 1.09 mg/dL). Liver Function Tests: Recent Labs  Lab 12/31/20 0352 01/02/21 0117 01/03/21 0352  AST 20 16 28   ALT 10 11 18   ALKPHOS 68 81 88  BILITOT 1.2 0.9 0.9  PROT 6.4* 6.3* 5.9*  ALBUMIN 2.6* 2.2* 1.9*   No results for input(s): LIPASE, AMYLASE in the last 168 hours. No results for input(s): AMMONIA in the last 168 hours. Coagulation Profile: No results for input(s): INR, PROTIME in the last 168 hours. Cardiac Enzymes: No results for input(s): CKTOTAL, CKMB, CKMBINDEX, TROPONINI in the last 168 hours. BNP (last 3 results) No results for input(s): PROBNP in the last 8760 hours. HbA1C: No results for input(s): HGBA1C in the last 72 hours. CBG: Recent Labs  Lab 01/01/21 2147 01/02/21 0759 01/02/21 1121 01/02/21 1623 01/02/21 2046   GLUCAP 208* 206* 270* 209* 188*   Lipid Profile: No results for input(s): CHOL, HDL, LDLCALC, TRIG, CHOLHDL, LDLDIRECT in the last 72 hours. Thyroid Function Tests: No results for input(s): TSH, T4TOTAL, FREET4, T3FREE, THYROIDAB in the last 72 hours. Anemia Panel: No results for input(s): VITAMINB12, FOLATE, FERRITIN, TIBC, IRON, RETICCTPCT in the last 72 hours. Sepsis Labs: No results for input(s): PROCALCITON, LATICACIDVEN in the last 168 hours.  Recent Results (from the past 240 hour(s))  Resp Panel by RT-PCR (Flu A&B, Covid) Nasopharyngeal Swab     Status: None   Collection Time: 12/29/20  2:26 PM   Specimen: Nasopharyngeal Swab; Nasopharyngeal(NP) swabs in vial transport medium  Result Value Ref Range Status   SARS Coronavirus 2 by RT PCR NEGATIVE NEGATIVE Final    Comment: (NOTE) SARS-CoV-2 target nucleic acids are NOT DETECTED.  The SARS-CoV-2 RNA is generally detectable in upper respiratory specimens during the acute phase of  infection. The lowest concentration of SARS-CoV-2 viral copies this assay can detect is 138 copies/mL. A negative result does not preclude SARS-Cov-2 infection and should not be used as the sole basis for treatment or other patient management decisions. A negative result may occur with  improper specimen collection/handling, submission of specimen other than nasopharyngeal swab, presence of viral mutation(s) within the areas targeted by this assay, and inadequate number of viral copies(<138 copies/mL). A negative result must be combined with clinical observations, patient history, and epidemiological information. The expected result is Negative.  Fact Sheet for Patients:  EntrepreneurPulse.com.au  Fact Sheet for Healthcare Providers:  IncredibleEmployment.be  This test is no t yet approved or cleared by the [place] FDA and  has been authorized for detection and/or diagnosis of SARS-CoV-2 by FDA under an  Emergency Use Authorization (EUA). This EUA will remain  in effect (meaning this test can be used) for the duration of the COVID-19 declaration under Section 564(b)(1) of the Act, 21 U.S.C.section 360bbb-3(b)(1), unless the authorization is terminated  or revoked sooner.       Influenza A by PCR NEGATIVE NEGATIVE Final   Influenza B by PCR NEGATIVE NEGATIVE Final    Comment: (NOTE) The Xpert Xpress SARS-CoV-2/FLU/RSV plus assay is intended as an aid in the diagnosis of influenza from Nasopharyngeal swab specimens and should not be used as a sole basis for treatment. Nasal washings and aspirates are unacceptable for Xpert Xpress SARS-CoV-2/FLU/RSV testing.  Fact Sheet for Patients: EntrepreneurPulse.com.au  Fact Sheet for Healthcare Providers: IncredibleEmployment.be  This test is not yet approved or cleared by the [place] FDA and has been authorized for detection and/or diagnosis of SARS-CoV-2 by FDA under an Emergency Use Authorization (EUA). This EUA will remain in effect (meaning this test can be used) for the duration of the COVID-19 declaration under Section 564(b)(1) of the Act, 21 U.S.C. section 360bbb-3(b)(1), unless the authorization is terminated or revoked.  Performed at [facility], [place] [address] [place], [place] [address]   Surgical pcr screen     Status: Abnormal   Collection Time: 12/29/20 11:11 PM   Specimen: Nasal Mucosa; Nasal Swab  Result Value Ref Range Status   MRSA, PCR POSITIVE (A) NEGATIVE Corrected    Comment: RESULT CALLED TO, READ BACK BY AND VERIFIED WITH: RN SYLVIA M. 12/30/20@1 :00 BY TW    Staphylococcus aureus POSITIVE (A) NEGATIVE Corrected    Comment: (NOTE) The Xpert SA Assay (FDA approved for NASAL specimens in patients 42 years of age and older), is one component of a comprehensive surveillance program. It is not intended to diagnose infection nor to guide or monitor  treatment. Performed at [facility], [place] [address], [place], [place] [address] CORRECTED ON 09/24 AT 0114: PREVIOUSLY REPORTED AS POSITIVE          Radiology Studies: US RENAL  Result Date: 01/01/2021 CLINICAL DATA:  Left-sided abdominal pain EXAM: RENAL / URINARY TRACT ULTRASOUND COMPLETE COMPARISON:  10/25/2020 FINDINGS: Right Kidney: Renal measurements: 12.0 x 6.6 x 6.5 cm = volume: 270.6 mL. Normal renal cortical echotexture. Multiple simple appearing cysts are identified largest measuring 3.3 cm. No hydronephrosis or nephrolithiasis. Left Kidney: Renal measurements: 11.8 x 7.5 x 7.0 cm = volume: 322.4 mL. Echogenicity within normal limits. No mass or hydronephrosis visualized. Bladder: Not well visualized. Other: Examination was terminated prematurely at the patient's request due to pain. IMPRESSION: 1. Simple right renal cysts. Otherwise unremarkable renal ultrasound. Electronically Signed   By: Legrand Como  Owens Shark M.D.   On: 01/01/2021 22:04        Scheduled Meds:  amiodarone  200 mg Oral Daily   amitriptyline  10 mg Oral QHS   apixaban  5 mg Oral BID   clonazePAM  0.5 mg Oral BID   docusate sodium  100 mg Oral BID   feeding supplement  237 mL Oral BID BM   gabapentin  300 mg Oral QHS   insulin aspart  0-15 Units Subcutaneous TID WC   insulin aspart  0-5 Units Subcutaneous QHS   insulin glargine-yfgn  8 Units Subcutaneous BID   melatonin  5 mg Oral QHS   metoprolol tartrate  12.5 mg Oral BID   multivitamin with minerals  1 tablet Oral Daily   mupirocin ointment  1 application Nasal BID   polyethylene glycol  17 g Oral BID   Continuous Infusions:  sodium chloride 75 mL/hr at 01/02/21 2108   methocarbamol (ROBAXIN) IV       LOS: 5 days   Time spent= 35 mins    Arma Reining Arsenio Loader, MD [facility]  If 7PM-7AM, please contact night-coverage  01/03/2021, 8:11 AM

## 2021-01-03 NOTE — Care Management Important Message (Signed)
Important Message  Patient Details  Name: Jason Hartman MRN: [id number] Date of Birth: 16-Oct-1935   Medicare Important Message Given:  Yes   Patient has a Contact Precaution order in  place will mail the IM document  to  the patient home address.   Krystn Dermody 01/03/2021, 2:39 PM

## 2021-01-03 NOTE — Progress Notes (Signed)
Nutrition Follow-up  DOCUMENTATION CODES:   Non-severe (moderate) malnutrition in context of chronic illness  INTERVENTION:   -D/c Ensure Enlive po BID, each supplement provides 350 kcal and 20 grams of protein  -30 ml Prosource Plus TID, each supplement provides 100 kcals and 15 grams protein -Continue Magic cup TID with meals, each supplement provides 290 kcal and 9 grams of protein  -Continue MVI with minerals daily -Continue with liberalized diet of regular -Feeding assistance with meals  NUTRITION DIAGNOSIS:   Moderate Malnutrition related to chronic illness (CAD) as evidenced by moderate fat depletion, moderate muscle depletion, severe muscle depletion, percent weight loss.  Ongoing  GOAL:   Patient will meet greater than or equal to 90% of their needs  Progressing   MONITOR:   PO intake, Supplement acceptance, Labs, Weight trends, Skin, I & O's  REASON FOR ASSESSMENT:   Malnutrition Screening Tool, Consult Hip fracture protocol  ASSESSMENT:   Jason Hartman is a 85 y.o. male with medical history significant of HTN; OSA; RLS; and DM presenting with fall.  He is in [facility] for deconditioning after a severe UTI.  He was attempting to transfer from wheelchair to bed without assistance when he fell and landed on his hip.  This happened 2-3 days ago and he thought it would get better but when he still couldn't bear weight he decided to come in.  9/24- s/p Left hip hemiarthroplasty  Reviewed I/O's: -380 ml x 24 hours and +670 ml since admission  UOP: 500 ml x 24 hours  Pt awoke when RD entered room. He complains of hip and scrotal pain, but reports they are tolerable now. He continues to reports poor oral intake, but thinks this is improving. Per pt, he consumed most of his french toast and bacon this AM. Noted meal completion 0-75%.   Noted pt has been refusing Ensure supplements. RD provided pt with Boost Breeze to try, which pt states he also does not like. RD  also provided pt with a soft drink per his request. Assisted pt with holding cup; observed pt had difficulty gripping cup and holding it up to his mouth with a straw.   Per MD notes, plan to d/c back to SNF once medically stable.   Medications reviewed and include melatonin, miralax, and 0.9% sodium chloride infusion @ 75 ml/hr. .   Labs reviewed: Na: 128, CBGS: 188-280 (inpatient orders for glycemic control are 0-15 units insulin aspart TID with meals, 0-5 units insulin aspart daily at bedtime, and 8 units insulin glargine-yfgn BID).    Diet Order:   Diet Order             Diet regular Room service appropriate? Yes with Assist; Fluid consistency: Thin  Diet effective now                   EDUCATION NEEDS:   No education needs have been identified at this time  Skin:  Skin Assessment: Skin Integrity Issues: Skin Integrity Issues:: Other (Comment), Incisions Incisions: closed lt hip Other: MASD to buttocks  Last BM:  12/28/20  Height:   Ht Readings from Last 1 Encounters:  12/29/20 5\' 10"  (1.778 m)    Weight:   Wt Readings from Last 1 Encounters:  12/29/20 68 kg    Ideal Body Weight:  75.5 kg  BMI:  Body mass index is 21.52 kg/m.  Estimated Nutritional Needs:   Kcal:  2050-2250  Protein:  100-115 grams  Fluid:  > 2  Ricka Burdock, RD, LDN, [place] Registered Dietitian II Certified Diabetes Care and Education Specialist Please refer to [facility] for RD and/or RD on-call/weekend/after hours pager

## 2021-01-03 NOTE — TOC Progression Note (Signed)
Transition of Care [facility]) - Initial/Assessment Note    Patient Details  Name: Jason Hartman MRN: [id number] Date of Birth: 17-May-1935  [facility] CM/SW Contact:    Milinda Antis, [place] Phone Number: 01/03/2021, 3:52 PM  Clinical Narrative:                 CSW continues to follow patient for d/c needs (return to SNF).  [facility] can accept patient once medically stable.  Lind Covert, MSW, LCSWA   Expected Discharge Plan: Skilled Nursing Facility Barriers to Discharge: Continued Medical Work up   Patient Goals and CMS Choice        Expected Discharge Plan and Services Expected Discharge Plan: [place] In-house Referral: Clinical Social Work     Living arrangements for the past 2 months: Single Family Home                                      Prior Living Arrangements/Services Living arrangements for the past 2 months: Single Family Home Lives with:: Self, Spouse Patient language and need for interpreter reviewed:: No        Need for Family Participation in Patient Care: Yes (Comment) Care giver support system in place?: Yes (comment)   Criminal Activity/Legal Involvement Pertinent to Current Situation/Hospitalization: No - Comment as needed  Activities of Daily Living Home Assistive Devices/Equipment: Cane (specify quad or straight) ADL Screening (condition at time of admission) Patient's cognitive ability adequate to safely complete daily activities?: Yes Is the patient deaf or have difficulty hearing?: No Does the patient have difficulty seeing, even when wearing glasses/contacts?: No Does the patient have difficulty concentrating, remembering, or making decisions?: Yes Patient able to express need for assistance with ADLs?: Yes Does the patient have difficulty dressing or bathing?: Yes Independently performs ADLs?: No Communication: Independent Dressing (OT): Needs assistance Is this a change from baseline?:  Pre-admission baseline Grooming: Needs assistance Is this a change from baseline?: Pre-admission baseline Feeding: Independent Bathing: Needs assistance Is this a change from baseline?: Pre-admission baseline Toileting: Needs assistance Is this a change from baseline?: Pre-admission baseline Walks in Home: Needs assistance Is this a change from baseline?: Pre-admission baseline Does the patient have difficulty walking or climbing stairs?: Yes Weakness of Legs: Both Weakness of Arms/Hands: Both  Permission Sought/Granted   Permission granted to share information with : Yes, Verbal Permission Granted  Share Information with NAME: Delyle Weider     Permission granted to share info w Relationship: spouse  Permission granted to share info w Contact Information: [phone]  Emotional Assessment Appearance:: Appears stated age Attitude/Demeanor/Rapport: Unable to Assess (on and off sleeping)   Orientation: : Oriented to Self, Oriented to Place, Oriented to  Time, Oriented to Situation Alcohol / Substance Use: Not Applicable Psych Involvement: No (comment)  Admission diagnosis:  Hip fracture ([place]) [S72.009A] Fall [W19.XXXA] Patient Active Problem List   Diagnosis Date Noted   Hip fracture ([place]) 12/29/2020   Malnutrition of moderate degree 10/27/2020   UTI (urinary tract infection) 10/25/2020   Sepsis ([place]) 10/12/2020   AMS (altered mental status)    PAF (paroxysmal atrial fibrillation) ([place])    Acute metabolic encephalopathy 87/86/7672   Anxiety 09/09/2020   SIRS (systemic inflammatory response syndrome) ([place]) 09/09/2020   Diaphoresis    Slow transit constipation 08/07/2020   Abdominal aortic aneurysm without rupture ([place]) 03/17/2020   Abnormal gait 03/17/2020  Ascending aorta dilatation (H[facility]) 03/17/2020   Diabetic renal disease ([place]) 03/17/2020   Recurrent UTI 07/16/2019   Hypertensive urgency 04/26/2019   Malignant HTN with heart disease, w/o CHF, w/o chronic kidney  disease 04/26/2019   Elevated troponin    LFTs abnormal    Statin intolerance 01/15/2019   Penis pain 04/10/2018   Urethra cancer ([place]) 02/25/2018   Lower extremity edema 06/09/2017   Peptic ulcer disease 05/16/2017   GERD (gastroesophageal reflux disease) 05/16/2017   Chronic kidney disease, stage 3a ([place]) 04/11/2017   Lower urinary tract symptoms (LUTS) 04/15/2016   Narcotic dependence ([place]) 08/31/2015   Imbalance 08/31/2015   Diabetic peripheral neuropathy ([place]) 08/31/2015   Other malaise and fatigue 05/02/2015   Chronic fatigue 05/02/2015   Panic disorder without agoraphobia 03/22/2015   Hyperlipidemia 02/03/2014   Thrombocytopenia ([place]) 08/18/2013   Parkinsonism ([place]) 08/18/2013   Other disorders of lung 08/18/2013   Organic impotence 08/18/2013   Memory loss 08/18/2013   Low back pain 08/18/2013   Iron deficiency anemia 08/18/2013   Hypercalcemia 08/18/2013   Cellulitis of right leg 08/18/2013   Atherosclerotic heart disease of native coronary artery without angina pectoris 08/18/2013   RLS (restless legs syndrome) 09/01/2012   HERPES SIMPLEX INFECTION 11/06/2006   Type II diabetes mellitus ([place]) 11/06/2006   HYPOGONADISM 11/06/2006   VITAMIN D DEFICIENCY 11/06/2006   ANXIETY 11/06/2006   OBSTRUCTIVE SLEEP APNEA 11/06/2006   RESTLESS LEG SYNDROME 11/06/2006   Essential hypertension 11/06/2006   BENIGN PROSTATIC HYPERTROPHY 11/06/2006   ROSACEA 11/06/2006   OSTEOARTHRITIS 11/06/2006   INSOMNIA 11/06/2006   HYPERGLYCEMIA 11/06/2006   COLONIC POLYPS, HX OF 11/06/2006   PCP:  [name], MD Pharmacy:   [facility] - [place], [place] - [address] [address] [facility] [address] [place] [address] Phone: [phone] Fax: [phone]     Social Determinants of Health (SDOH) Interventions    Readmission Risk Interventions No flowsheet data found.

## 2021-01-04 DIAGNOSIS — S72002G Fracture of unspecified part of neck of left femur, subsequent encounter for closed fracture with delayed healing: Secondary | ICD-10-CM | POA: Diagnosis not present

## 2021-01-04 DIAGNOSIS — F05 Delirium due to known physiological condition: Secondary | ICD-10-CM

## 2021-01-04 DIAGNOSIS — R410 Disorientation, unspecified: Secondary | ICD-10-CM

## 2021-01-04 DIAGNOSIS — I10 Essential (primary) hypertension: Secondary | ICD-10-CM | POA: Diagnosis not present

## 2021-01-04 DIAGNOSIS — F419 Anxiety disorder, unspecified: Secondary | ICD-10-CM | POA: Diagnosis not present

## 2021-01-04 DIAGNOSIS — G4733 Obstructive sleep apnea (adult) (pediatric): Secondary | ICD-10-CM | POA: Diagnosis not present

## 2021-01-04 LAB — BASIC METABOLIC PANEL
Anion gap: 9 (ref 5–15)
BUN: 17 mg/dL (ref 8–23)
CO2: 22 mmol/L (ref 22–32)
Calcium: 8.3 mg/dL — ABNORMAL LOW (ref 8.9–10.3)
Chloride: 98 mmol/L (ref 98–111)
Creatinine, Ser: 0.97 mg/dL (ref 0.61–1.24)
GFR, Estimated: >60 mL/min (ref >60)
Glucose, Bld: 135 mg/dL — ABNORMAL HIGH (ref 70–99)
Potassium: 3.8 mmol/L (ref 3.5–5.1)
Sodium: 129 mmol/L — ABNORMAL LOW (ref 135–145)

## 2021-01-04 LAB — GLUCOSE, CAPILLARY
Glucose-Capillary: 243 mg/dL — ABNORMAL HIGH (ref 70–99)
Glucose-Capillary: 131 mg/dL — ABNORMAL HIGH (ref 70–99)
Glucose-Capillary: 210 mg/dL — ABNORMAL HIGH (ref 70–99)
Glucose-Capillary: 146 mg/dL — ABNORMAL HIGH (ref 70–99)
Glucose-Capillary: 165 mg/dL — ABNORMAL HIGH (ref 70–99)

## 2021-01-04 LAB — CBC
HCT: 26.6 % — ABNORMAL LOW (ref 39.0–52.0)
Hemoglobin: 8.7 g/dL — ABNORMAL LOW (ref 13.0–17.0)
MCH: 29 pg (ref 26.0–34.0)
MCHC: 32.7 g/dL (ref 30.0–36.0)
MCV: 88.7 fL (ref 80.0–100.0)
Platelets: 218 10*3/uL (ref 150–400)
RBC: 3 MIL/uL — ABNORMAL LOW (ref 4.22–5.81)
RDW: 13.5 % (ref 11.5–15.5)
WBC: 12.1 10*3/uL — ABNORMAL HIGH (ref 4.0–10.5)
nRBC: 0 % (ref 0.0–0.2)

## 2021-01-04 LAB — URINALYSIS, ROUTINE W REFLEX MICROSCOPIC
Bilirubin Urine: NEGATIVE
Glucose, UA: NEGATIVE mg/dL
Ketones, ur: NEGATIVE mg/dL
Nitrite: NEGATIVE
Protein, ur: 100 mg/dL — AB
Specific Gravity, Urine: 1.01 (ref 1.005–1.030)
WBC, UA: >50 WBC/hpf — ABNORMAL HIGH (ref 0–5)
pH: 5 (ref 5.0–8.0)

## 2021-01-04 LAB — MAGNESIUM: Magnesium: 1.8 mg/dL (ref 1.7–2.4)

## 2021-01-04 MED ORDER — CLONAZEPAM 0.25 MG PO TBDP
0.2500 mg | ORAL_TABLET | Freq: Every morning | ORAL | Status: DC
  Administered 2021-01-05 – 2021-01-08 (×4): 0.25 mg via ORAL
  Filled 2021-01-04 (×4): qty 1

## 2021-01-04 MED ORDER — CLONAZEPAM 0.5 MG PO TABS
0.5000 mg | ORAL_TABLET | Freq: Every day | ORAL | Status: DC
  Administered 2021-01-04 – 2021-01-08 (×5): 0.5 mg via ORAL
  Filled 2021-01-04 (×5): qty 1

## 2021-01-04 MED ORDER — OLANZAPINE 2.5 MG PO TABS
2.5000 mg | ORAL_TABLET | Freq: Two times a day (BID) | ORAL | Status: DC | PRN
  Administered 2021-01-04 – 2021-01-07 (×2): 2.5 mg via ORAL
  Filled 2021-01-04 (×4): qty 1

## 2021-01-04 MED ORDER — RISPERIDONE 1 MG PO TABS
1.0000 mg | ORAL_TABLET | Freq: Every day | ORAL | Status: DC
  Filled 2021-01-04: qty 1

## 2021-01-04 MED ORDER — OLANZAPINE 2.5 MG PO TABS
2.5000 mg | ORAL_TABLET | Freq: Every day | ORAL | Status: DC
  Administered 2021-01-04 – 2021-01-08 (×5): 2.5 mg via ORAL
  Filled 2021-01-04 (×5): qty 1

## 2021-01-04 MED ORDER — SODIUM CHLORIDE 0.9 % IV SOLN
1.0000 g | Freq: Three times a day (TID) | INTRAVENOUS | Status: DC
  Administered 2021-01-04 – 2021-01-05 (×3): 1 g via INTRAVENOUS
  Filled 2021-01-04 (×4): qty 1

## 2021-01-04 NOTE — Consult Note (Signed)
[facility] New Psychiatric Evaluation   Service Date: January 04, 2021 LOS:  LOS: 6 days    Assessment  Jason Hartman is a 85 y.o. male admitted medically for 12/29/2020 12:45 PM for a hip fracture. He carries the psychiatric diagnoses of anxiety and insomnia (per wife) and has a complicated past medical history most pertinent for recurrent UTIs.Psychiatry was consulted for agitation/hallucinations by Dr. Reesa Chew.  His current presentation of waxing and waning levels of attention and orientation is most consistent with delirium. I have a large concern he has underlying parkinsonian features (perhaps LBD as hallucinations appeared early in course) given my exam and wife's collateral; also has numerous atypical responses to psychotropic medications.  Current outpatient psychotropic medications include amitriptyline, klonopin and risperidone and historically he has had a poor response to these medications as evidenced by worsening confusion since this started. He was likely compliant with medications prior to admission as evidenced by in SNF prior to admission. On initial examination, patient continually screamed out "hlep me" and had difficulty with orientation questions. Please see plan below for detailed recommendations.   Diagnoses:  Active Hospital problems: Principal Problem:   Hip fracture (HCC) Active Problems:   Type II diabetes mellitus ([place])   OBSTRUCTIVE SLEEP APNEA   Essential hypertension   Atherosclerotic heart disease of native coronary artery without angina pectoris   Anxiety   PAF (paroxysmal atrial fibrillation) ([place])    Problems edited/added by me: No problems updated.  Plan  ## Safety and Observation Level:  - Based on my clinical evaluation, I estimate the patient to be at low risk of self harm in the current setting; has not attempted to get out of bed - At this time, we recommend a standard level of observation. This decision is based on my review  of the chart including patient's history and current presentation, interview of the patient, mental status examination, and consideration of suicide risk including evaluating suicidal ideation, plan, intent, suicidal or self-harm behaviors, risk factors, and protective factors. This judgment is based on our ability to directly address suicide risk, implement suicide prevention strategies and develop a safety plan while the patient is in the clinical setting. Please contact our team if there is a concern that risk level has changed. -- low threshold to escalate to 1:1 sitter if pt begins to attempt to get out of bed  ## Medications:  STOP: - risperdal, elavil, PRN haldol  - risperdal/haldol are more likely to worsen parkinsonian features as dose escalated  - elevil is underdosed for anxiety, contributing to anticholinergic burden  DECREASE: klonopin from 0.5 mg BID to 0.25/0.5 mg START: olanzapine 2.5 mg QHS and BID PRN CONTINUE: gabapentin 300 mg qhs at present   ## Medical Decision Making Capacity:  Not formally assessed  ## Further Work-up:  -- u/a -- repeat EKG -- monitor & keep K+>4 and Mg+>2   ## Disposition:  -- no psychiatric contraindication to dc when medically appropriate  ## Behavioral / Environmental:  -- frequent reorientation, ordering delirium precautions  ##Legal Status   Thank you for this consult request. Recommendations have been communicated to the primary team.  We will continue to follow at this time.   [place] A Javari Bufkin    NEW  history  Relevant Aspects of Hospital Course:  Admitted on 12/29/2020 for a bad .  No u/a this admission   Qtc 484 with 1st degree av block 9/26 (prior to haloperidol & risperdal being started) Last albumin 1.9  Centrally acting meds at time of consult included:  Psych Amitriptyline 10 mg Clonazepam 0.5 mg (new med mid September per EMR) Gabapentin 300 mg QHS Haloperidol 2 PO or IM q6 agitation Melatonin 5  q2200 Risperidone 1 QHS  Non-psych: Methocarbamol Dilaudid   Patient Report:  Patient seen in midafternoon. Oriented to self and year; states "I used to be in a hospital but I'm not anymore". Frequently calls out "please God help me". Oriented to self only. Keeps tugging at gown and revealing self; ultimately calmed down when gown off and sheet over groin. Unable to perform formal attention testing given frequent outbursts.   ROS:  Pain, otherwise unable to perform  Collateral information:  Wife says that he just called her and said he was in a different room. Wife thinks he has gotten worse - he recognizes her, 26, daughter, etc. He is confused about a lot of things. He has been taking the amitriptyline for about 2 months - for anxiety and to help sleep. Has been on klonopin for just a couple of weeks, maybe 2 weeks. Had been on ativan prior to klonopin for about a month. Takes no other psychiatric medications. This all started in the first part of June - took cymbalta for about 2 weeks and one night his blood pressure and heart rate went sky high and stayed in the hospital - developed a serious UTI. After that he went to the rehab facility. Had minor memory issues (forgeful, losing things) before all this started. Could tell you the day but not the date. Since he's been in the hospital has gotten much more confused. She has noticed him seeing things and hallucinating - more when he dozes off to sleep. At least 4 months ago he started thinking there was someone standing in front of him, here in the house. Wife has noticed a decline in balance (used walking sticks). She noticed some tremors as well. His neurologist says he has a lot of parkinsonian symptoms.   Psychiatric History:  Information collected from wife No smoking, drinking. Hasn't smoked in almost 40 years, drunk for about 10  Family psych history: none  Medical History: Past Medical History:  Diagnosis Date   CAD  (coronary artery disease)    s/p stents   Cellulitis and abscess of leg, except foot    Hypertension    Obstructive sleep apnea (adult) (pediatric)    Restless legs syndrome (RLS)    Thoracic or lumbosacral neuritis or radiculitis, unspecified    Type II or unspecified type diabetes mellitus without mention of complication, not stated as uncontrolled    Unspecified disease of pericardium    Unspecified hereditary and idiopathic peripheral neuropathy     Surgical History: Past Surgical History:  Procedure Laterality Date   ANAL FISSURE REPAIR     ANTERIOR APPROACH HEMI HIP ARTHROPLASTY Left 12/30/2020   Procedure: ANTERIOR APPROACH HIP ARTHROPLASTY;  Surgeon: Vanetta Mulders, MD;  Location: [facility];  Service: Orthopedics;  Laterality: Left;   pericarditis  2009    Medications:   Current Facility-Administered Medications:    (feeding supplement) PROSource Plus liquid 30 mL, 30 mL, Oral, TID BM, Amin, Ankit Chirag, MD, 30 mL at 01/04/21 0855   amiodarone (PACERONE) tablet 200 mg, 200 mg, Oral, Daily, Karmen Bongo, MD, 200 mg at 01/04/21 0854   amitriptyline (ELAVIL) tablet 10 mg, 10 mg, Oral, QHS, Karmen Bongo, MD, 10 mg at 01/03/21 2123   apixaban (ELIQUIS) tablet 5 mg, 5 mg, Oral, BID, Edwina Barth,  Idamae Lusher, RPH, 5 mg at 01/04/21 0854   bisacodyl (DULCOLAX) EC tablet 5 mg, 5 mg, Oral, Daily PRN, Karmen Bongo, MD   clonazePAM Bobbye [place]) tablet 0.5 mg, 0.5 mg, Oral, BID, Elodia Florence., MD, 0.5 mg at 01/04/21 3474   docusate sodium (COLACE) capsule 100 mg, 100 mg, Oral, BID, Karmen Bongo, MD, 100 mg at 01/04/21 0855   gabapentin (NEURONTIN) capsule 300 mg, 300 mg, Oral, QHS, Karmen Bongo, MD, 300 mg at 01/03/21 2124   haloperidol (HALDOL) tablet 2 mg, 2 mg, Oral, Q6H PRN **OR** haloperidol lactate (HALDOL) injection 2 mg, 2 mg, Intramuscular, Q6H PRN, Amin, Ankit Chirag, MD, 2 mg at 01/04/21 1142   HYDROcodone-acetaminophen (NORCO/VICODIN) 5-325 MG per tablet 1-2  tablet, 1-2 tablet, Oral, Q4H PRN, Damita Lack, MD, 2 tablet at 01/04/21 0852   HYDROmorphone (DILAUDID) injection 0.5 mg, 0.5 mg, Intravenous, Q3H PRN, Vanetta Mulders, MD, 0.5 mg at 01/04/21 0425   insulin aspart (novoLOG) injection 0-15 Units, 0-15 Units, Subcutaneous, TID WC, Karmen Bongo, MD, 5 Units at 01/04/21 1148   insulin aspart (novoLOG) injection 0-5 Units, 0-5 Units, Subcutaneous, QHS, Karmen Bongo, MD, 2 Units at 01/03/21 2122   insulin glargine-yfgn (SEMGLEE) injection 10 Units, 10 Units, Subcutaneous, BID, Amin, Ankit Chirag, MD, 10 Units at 01/04/21 0856   ipratropium-albuterol (DUONEB) 0.5-2.5 (3) MG/3ML nebulizer solution 3 mL, 3 mL, Nebulization, Q4H PRN, Amin, Ankit Chirag, MD   melatonin tablet 5 mg, 5 mg, Oral, QHS, Karmen Bongo, MD, 5 mg at 01/03/21 2124   methocarbamol (ROBAXIN) tablet 500 mg, 500 mg, Oral, Q6H PRN, 500 mg at 01/04/21 0852 **OR** methocarbamol (ROBAXIN) 500 mg in dextrose 5 % 50 mL IVPB, 500 mg, Intravenous, Q6H PRN, Karmen Bongo, MD   metoprolol tartrate (LOPRESSOR) tablet 12.5 mg, 12.5 mg, Oral, BID, Karmen Bongo, MD, 12.5 mg at 01/04/21 2595   multivitamin with minerals tablet 1 tablet, 1 tablet, Oral, Daily, Elodia Florence., MD, 1 tablet at 01/04/21 0853   polyethylene glycol (MIRALAX / GLYCOLAX) packet 17 g, 17 g, Oral, BID, Elodia Florence., MD, 17 g at 01/04/21 0856   risperiDONE (RISPERDAL) tablet 1 mg, 1 mg, Oral, QHS, Amin, Ankit Chirag, MD   traZODone (DESYREL) tablet 50 mg, 50 mg, Oral, QHS PRN, Amin, Jeanella Flattery, MD  Allergies: Allergies  Allergen Reactions   Oxycodone Anaphylaxis   Iodinated Diagnostic Agents Hives    alleric to renografin,isovue & omnipaque, hives, requires 13 hr prep//a.calhoun, Onset Date: [id number]      Iodine Hives and Rash    alleric to renografin,isovue & omnipaque, hives, requires 13 hr prep//a.calhoun, Onset Date: [id number] allergic to renografin,isovue & omnipaque, hives,  requires 13 hr prep//a.calhoun, Onset Date: [id number]   Linezolid Hives and Rash        Methadone Hcl Other (See Comments)    hallucinations    Promethazine Other (See Comments)    Delirium (pulled out IV), erratic behavior Mental status change     Isovue [Iopamidol] Hives   Morphine Sulfate Other (See Comments)    Unknown reaction   Omnipaque [Iohexol] Hives    Code: HIVES, Desc: alleric to renografin,isovue & omnipaque, hives, requires 13 hr prep//a.calhoun, Onset Date: [id number]    Quetiapine Other (See Comments)    Unknown reaction   Renografin [Diatrizoate] Hives   Statins Other (See Comments)    Muscle weakness   Zolpidem Other (See Comments)    Erratic behavior/delusions Altered mental status   Atorvastatin Itching and Other (See  Comments)    Tired, weakness    Carbidopa-Levodopa Anxiety   Chlorhexidine Gluconate [Chlorhexidine] Hives and Rash   Clindamycin Rash   Colesevelam Other (See Comments)    tired    Doxazosin Rash   Lovastatin Other (See Comments)    Tired, nervousness    Rosuvastatin Other (See Comments)    Tired, weakness     Social History:  Married for 31 years, together for 40 years.   Tobacco use: none x 50 years Alcohol use: none for past several years Drug use: denies  Family History: no family psych history.  The patient's family history includes Diabetes in his father.    Objective  Vital signs:  Temp:  [98.2 F (36.8 C)-100.2 F (37.9 C)] 98.5 F (36.9 C) (09/29 0837) Pulse Rate:  [88-90] 89 (09/29 0837) Resp:  [18] 18 (09/28 2015) BP: (134-153)/(68-82) 134/82 (09/29 0837) SpO2:  [98 %-100 %] 100 % (09/29 0837)  Physical Exam: Gen: Uncomfortable, pulling a gown, frequently naked. Pulm: no increased WOB G/u: condom cath, yellow urine Neuro: CNII-XII grossly intact, spontaneous movement in all for extremities Psych: oriented x1  Mild cogwheeling R arm  Mental Status Exam: Appearance: Scared, lying in bed, frequently  pulls at gown/exposes self  Attitude:  Uncooperative, yelling  Behavior/Psychomotor: Constantly pulling at gown until removed  Speech/Language:  Repetitive, same phrase multiple times "God help me",  Mood: Did not state  Affect: terrified  Thought process: Concrete  Thought content:   Denied SI/HI when asked  Perceptual disturbances:  Not overtly RIS at time of exam  Attention: Poor, not formally assessed   Concentration: Poor, not formally assessed  Orientation: Self and occasionally hospital  Memory: Recent memory semiintact (knew he had a fall and couldn't get out of bed)  Fund of knowledge:  Not formally assessed   Insight:   Fair (knew he was confused)  Judgment:  poor  Impulse Control: poor

## 2021-01-04 NOTE — Progress Notes (Signed)
Pharmacy Antibiotic Note  Jason Hartman is a 85 y.o. male admitted on 12/29/2020 with fall from SNF for deconditioning after severe UTI. He has been readmitted with continued concern for AMS and UTI  Pharmacy has been consulted for Meropenem dosing. Patient has history of ESBL Klebsiella Pneumo UTI.  9/29 Scr 0.97   Plan: Meropenem 1gm q8hr  based on history of ESBL kleb penumo. Continue to monitor clinical status, cultures, and ability to de-escalate therapy  Height: 5\' 10"  (177.8 cm) Weight: 68 kg (150 lb) IBW/kg (Calculated) : 73  Temp (24hrs), Avg:98.8 F (37.1 C), Min:97.6 F (36.4 C), Max:100.2 F (37.9 C)  Recent Labs  Lab 12/30/20 0431 12/31/20 0352 01/02/21 0117 01/03/21 0352 01/04/21 0553  WBC 8.4 18.4* 15.4* 12.1* 12.1*  CREATININE 1.09 1.22 1.12 1.09 0.97    Estimated Creatinine Clearance: 56.5 mL/min (by C-G formula based on SCr of 0.97 mg/dL).    Allergies  Allergen Reactions   Oxycodone Anaphylaxis   Iodinated Diagnostic Agents Hives    alleric to renografin,isovue & omnipaque, hives, requires 13 hr prep//a.calhoun, Onset Date: [id number]      Iodine Hives and Rash    alleric to renografin,isovue & omnipaque, hives, requires 13 hr prep//a.calhoun, Onset Date: [id number] allergic to renografin,isovue & omnipaque, hives, requires 13 hr prep//a.calhoun, Onset Date: [id number]   Linezolid Hives and Rash        Methadone Hcl Other (See Comments)    hallucinations    Promethazine Other (See Comments)    Delirium (pulled out IV), erratic behavior Mental status change     Isovue [Iopamidol] Hives   Morphine Sulfate Other (See Comments)    Unknown reaction   Omnipaque [Iohexol] Hives    Code: HIVES, Desc: alleric to renografin,isovue & omnipaque, hives, requires 13 hr prep//a.calhoun, Onset Date: [id number]    Quetiapine Other (See Comments)    Unknown reaction   Renografin [Diatrizoate] Hives   Statins Other (See Comments)    Muscle weakness   Zolpidem  Other (See Comments)    Erratic behavior/delusions Altered mental status   Atorvastatin Itching and Other (See Comments)    Tired, weakness    Carbidopa-Levodopa Anxiety   Chlorhexidine Gluconate [Chlorhexidine] Hives and Rash   Clindamycin Rash   Colesevelam Other (See Comments)    tired    Doxazosin Rash   Lovastatin Other (See Comments)    Tired, nervousness    Rosuvastatin Other (See Comments)    Tired, weakness     Antimicrobials this admission: Meropenem 9/29 >>   Dose adjustments this admission:  Microbiology results: 9/29 UCx: IP  9/23 MRSA PCR: +  Thank you for allowing pharmacy to be a part of this patient's care.  Donnald Garre, PharmD Clinical Pharmacist  Please check AMION for all [facility] numbers After 10:00 PM, call [facility] [phone]

## 2021-01-04 NOTE — Progress Notes (Signed)
Patient calling out throughout shift in obvious distress.  Was hallucinating and delusional.  Stated, "It hurts..... It hurts!".  Given Dilaudid for pain and zyprexa approximately one hour afterwards with good effects.  Was able to consume supper while being fed by wife.

## 2021-01-04 NOTE — Progress Notes (Addendum)
PROGRESS NOTE    Jason Hartman  PNT:[id number] DOB: [birth year] DOA: 12/29/2020 PCP: [name], MD   Brief Narrative:  85 year old with history of HTN, OSA, RLS and DM2 presented with fall from SNF.  He has been at [facility] for deconditioning after severe UTI.  He fell after attempting to transfer from wheelchair to bed landed on his hip and found to have left hip fracture.   Assessment & Plan:   Principal Problem:   Hip fracture ([place]) Active Problems:   Type II diabetes mellitus ([place])   OBSTRUCTIVE SLEEP APNEA   Essential hypertension   Atherosclerotic heart disease of native coronary artery without angina pectoris   Anxiety   PAF (paroxysmal atrial fibrillation) (HCC)   Delirium  ? Mild cognitive impairment -Delirium precautions in place.  Increase Risperdal 1 mg at bedtime.  We will consult psychiatry for medication adjustment.  UA is positive. Hx of resistant klebsiella. Will order cefepime.    Left hip fracture status post left hemiarthroplasty 9/24 -Pain control, PT/OT - Continue home Eliquis.  Weightbearing as tolerated.   Hyponatremia -Continue gentle hydration   Right testicular pain due to multiseptated cyst; 5cm.  -Ultrasound-unremarkable.  Previous hospitalist spoke with neurology who recommends outpatient follow-up at [facility]. Follow up US in 3 months.    CAD status post PCI -Currently chest pain-free   Chronic atrial fibrillation -On amiodarone and Lopressor   Mood disorder -Elavil, Cymbalta, melatonin, clonazepam   HTN -Lopressor   Diabetes mellitus type 2, insulin-dependent Peripheral neuropathy -A1c 7.2 -Continue Lantus, sliding scale and Accu-Cheks - Continue Neurontin at bedtime   OSA -Continue CPAP    DVT prophylaxis: Eliquis  Code Status: Full  Family Communication:  Wife but no answer, vm not set up.   Status is: Inpatient  Remains inpatient appropriate because:Inpatient level of care appropriate due to severity of  illness  Dispo: The patient is from: Home              Anticipated d/c is to: SNF              Patient currently is not medically stable to d/c.   Difficult to place patient No       Nutritional status  Nutrition Problem: Moderate Malnutrition Etiology: chronic illness (CAD)  Signs/Symptoms: moderate fat depletion, moderate muscle depletion, severe muscle depletion, percent weight loss Percent weight loss: 15 %  Interventions: Ensure Enlive (each supplement provides 350kcal and 20 grams of protein), MVI  Body mass index is 21.52 kg/m.           Subjective: Denies any medical complaints. Off and on heard screaming in the hallway but when I go in the room he doesn't have any complaints. Didn't sleep at night as he gets very agitated.   Review of Systems Otherwise negative except as per HPI, including: General = no fevers, chills, dizziness,  fatigue HEENT/EYES = negative for loss of vision, double vision, blurred vision,  sore throa Cardiovascular= negative for chest pain, palpitation Respiratory/lungs= negative for shortness of breath, cough, wheezing; hemoptysis,  Gastrointestinal= negative for nausea, vomiting, abdominal pain Genitourinary= negative for Dysuria MSK = Negative for arthralgia, myalgias Neurology= Negative for headache, numbness, tingling  Psychiatry= Negative for suicidal and homocidal ideation Skin= Negative for Rash   Examination: Constitutional: Not in acute distress Respiratory: Clear to auscultation bilaterally Cardiovascular: Normal sinus rhythm, no rubs Abdomen: Nontender nondistended good bowel sounds Musculoskeletal: No edema noted Skin: hip dressing in place.  Neurologic: CN 2-12 grossly  intact.  And nonfocal Psychiatric: poor judgment and insight. Alert and oriented x 1    Objective: Vitals:   01/03/21 0732 01/03/21 1500 01/03/21 2015 01/04/21 0837  BP: (!) 146/77 (!) 150/78 (!) 153/68 134/82  Pulse: 89 90 88 89  Resp: 17  18 18    Temp: 98.1 F (36.7 C) 98.2 F (36.8 C) 100.2 F (37.9 C) 98.5 F (36.9 C)  TempSrc: Oral Oral Oral Oral  SpO2: 99% 98% 98% 100%  Weight:      Height:        Intake/Output Summary (Last 24 hours) at 01/04/2021 1043 Last data filed at 01/04/2021 0900 Gross per 24 hour  Intake 240 ml  Output 2000 ml  Net -1760 ml   Filed Weights   12/29/20 1249  Weight: 68 kg     Data Reviewed:   CBC: Recent Labs  Lab 12/30/20 0431 12/31/20 0352 01/02/21 0117 01/03/21 0352 01/04/21 0553  WBC 8.4 18.4* 15.4* 12.1* 12.1*  NEUTROABS  --  15.9* 12.8* 9.3*  --   HGB 11.5* 10.4* 9.7* 9.5* 8.7*  HCT 35.7* 31.3* 29.9* 28.3* 26.6*  MCV 90.8 88.7 89.3 88.2 88.7  PLT 225 233 192 211 468   Basic Metabolic Panel: Recent Labs  Lab 12/30/20 0431 12/31/20 0352 01/02/21 0117 01/03/21 0352 01/04/21 0553  NA 137 132* 128* 128* 129*  K 4.2 4.4 4.2 3.7 3.8  CL 100 98 94* 95* 98  CO2 30 23 25 26 22   GLUCOSE 114* 183* 130* 169* 135*  BUN 18 20 25* 22 17  CREATININE 1.09 1.22 1.12 1.09 0.97  CALCIUM 9.1 8.7* 8.7* 8.3* 8.3*  MG  --  1.5* 2.0 2.0 1.8  PHOS  --  3.5 3.7 3.9  --    GFR: Estimated Creatinine Clearance: 56.5 mL/min (by C-G formula based on SCr of 0.97 mg/dL). Liver Function Tests: Recent Labs  Lab 12/31/20 0352 01/02/21 0117 01/03/21 0352  AST 20 16 28   ALT 10 11 18   ALKPHOS 68 81 88  BILITOT 1.2 0.9 0.9  PROT 6.4* 6.3* 5.9*  ALBUMIN 2.6* 2.2* 1.9*   No results for input(s): LIPASE, AMYLASE in the last 168 hours. No results for input(s): AMMONIA in the last 168 hours. Coagulation Profile: No results for input(s): INR, PROTIME in the last 168 hours. Cardiac Enzymes: No results for input(s): CKTOTAL, CKMB, CKMBINDEX, TROPONINI in the last 168 hours. BNP (last 3 results) No results for input(s): PROBNP in the last 8760 hours. HbA1C: No results for input(s): HGBA1C in the last 72 hours. CBG: Recent Labs  Lab 01/03/21 0843 01/03/21 1153 01/03/21 1636  01/03/21 2051 01/04/21 0635  GLUCAP 280* 222* 237* 228* 131*   Lipid Profile: No results for input(s): CHOL, HDL, LDLCALC, TRIG, CHOLHDL, LDLDIRECT in the last 72 hours. Thyroid Function Tests: No results for input(s): TSH, T4TOTAL, FREET4, T3FREE, THYROIDAB in the last 72 hours. Anemia Panel: No results for input(s): VITAMINB12, FOLATE, FERRITIN, TIBC, IRON, RETICCTPCT in the last 72 hours. Sepsis Labs: No results for input(s): PROCALCITON, LATICACIDVEN in the last 168 hours.  Recent Results (from the past 240 hour(s))  Resp Panel by RT-PCR (Flu A&B, Covid) Nasopharyngeal Swab     Status: None   Collection Time: 12/29/20  2:26 PM   Specimen: Nasopharyngeal Swab; Nasopharyngeal(NP) swabs in vial transport medium  Result Value Ref Range Status   SARS Coronavirus 2 by RT PCR NEGATIVE NEGATIVE Final    Comment: (NOTE) SARS-CoV-2 target nucleic acids are NOT DETECTED.  The SARS-CoV-2 RNA is generally detectable in upper respiratory specimens during the acute phase of infection. The lowest concentration of SARS-CoV-2 viral copies this assay can detect is 138 copies/mL. A negative result does not preclude SARS-Cov-2 infection and should not be used as the sole basis for treatment or other patient management decisions. A negative result may occur with  improper specimen collection/handling, submission of specimen other than nasopharyngeal swab, presence of viral mutation(s) within the areas targeted by this assay, and inadequate number of viral copies(<138 copies/mL). A negative result must be combined with clinical observations, patient history, and epidemiological information. The expected result is Negative.  Fact Sheet for Patients:  EntrepreneurPulse.com.au  Fact Sheet for Healthcare Providers:  IncredibleEmployment.be  This test is no t yet approved or cleared by the [place] FDA and  has been authorized for detection and/or diagnosis  of SARS-CoV-2 by FDA under an Emergency Use Authorization (EUA). This EUA will remain  in effect (meaning this test can be used) for the duration of the COVID-19 declaration under Section 564(b)(1) of the Act, 21 U.S.C.section 360bbb-3(b)(1), unless the authorization is terminated  or revoked sooner.       Influenza A by PCR NEGATIVE NEGATIVE Final   Influenza B by PCR NEGATIVE NEGATIVE Final    Comment: (NOTE) The Xpert Xpress SARS-CoV-2/FLU/RSV plus assay is intended as an aid in the diagnosis of influenza from Nasopharyngeal swab specimens and should not be used as a sole basis for treatment. Nasal washings and aspirates are unacceptable for Xpert Xpress SARS-CoV-2/FLU/RSV testing.  Fact Sheet for Patients: EntrepreneurPulse.com.au  Fact Sheet for Healthcare Providers: IncredibleEmployment.be  This test is not yet approved or cleared by the [place] FDA and has been authorized for detection and/or diagnosis of SARS-CoV-2 by FDA under an Emergency Use Authorization (EUA). This EUA will remain in effect (meaning this test can be used) for the duration of the COVID-19 declaration under Section 564(b)(1) of the Act, 21 U.S.C. section 360bbb-3(b)(1), unless the authorization is terminated or revoked.  Performed at [facility], [place] [address], [place], [place] [address]   Surgical pcr screen     Status: Abnormal   Collection Time: 12/29/20 11:11 PM   Specimen: Nasal Mucosa; Nasal Swab  Result Value Ref Range Status   MRSA, PCR POSITIVE (A) NEGATIVE Corrected    Comment: RESULT CALLED TO, READ BACK BY AND VERIFIED WITH: RN SYLVIA M. 12/30/20@1 :00 BY TW    Staphylococcus aureus POSITIVE (A) NEGATIVE Corrected    Comment: (NOTE) The Xpert SA Assay (FDA approved for NASAL specimens in patients 72 years of age and older), is one component of a comprehensive surveillance program. It is not intended to diagnose infection nor to guide  or monitor treatment. Performed at [facility], [place] [address] [place], [place] [address] CORRECTED ON 09/24 AT 0114: PREVIOUSLY REPORTED AS POSITIVE          Radiology Studies: No results found.      Scheduled Meds:  (feeding supplement) PROSource Plus  30 mL Oral TID BM   amiodarone  200 mg Oral Daily   amitriptyline  10 mg Oral QHS   apixaban  5 mg Oral BID   clonazePAM  0.5 mg Oral BID   docusate sodium  100 mg Oral BID   gabapentin  300 mg Oral QHS   insulin aspart  0-15 Units Subcutaneous TID WC   insulin aspart  0-5 Units Subcutaneous QHS   insulin glargine-yfgn  10 Units  Subcutaneous BID   melatonin  5 mg Oral QHS   metoprolol tartrate  12.5 mg Oral BID   multivitamin with minerals  1 tablet Oral Daily   polyethylene glycol  17 g Oral BID   risperiDONE  1 mg Oral QHS   Continuous Infusions:  methocarbamol (ROBAXIN) IV       LOS: 6 days   Time spent= 35 mins    Laquilla Dault Arsenio Loader, MD [facility]  If 7PM-7AM, please contact night-coverage  01/04/2021, 10:43 AM

## 2021-01-04 NOTE — Progress Notes (Signed)
Physical Therapy Treatment Patient Details Name: Jason Hartman MRN: [id number] DOB: 05-25-1935 Today's Date: 01/04/2021   History of Present Illness Jason Hartman is a 85 y.o. male presenting with fall.  He was in [facility] for deconditioning after a severe UTI.  He was attempting to transfer from wheelchair to bed without assistance when he fell and landed on his hip.  Pt with left femoral neck fracture with left hemiarthroplasty on 9/24.  PMH:  HTN; OSA; RLS; and DM    PT Comments    Pt admitted with above diagnosis. Pt still needed max to total assist for bed mobiliy and max to periods of min assist for sitting EOB.  Pt self limiting and limited by pain.  Pt currently with functional limitations due to balance and endurance deficits. Pt will benefit from skilled PT to increase their independence and safety with mobility to allow discharge to the venue listed below.   Recommendations for follow up therapy are one component of a multi-disciplinary discharge planning process, led by the attending physician.  Recommendations may be updated based on patient status, additional functional criteria and insurance authorization.  Follow Up Recommendations  SNF     Equipment Recommendations  Other (comment) (TBA)    Recommendations for Other Services       Precautions / Restrictions Precautions Precautions: Anterior Hip Precaution Booklet Issued: Yes (comment) Precaution Comments: Discussed precautions Restrictions Weight Bearing Restrictions: Yes LLE Weight Bearing: Weight bearing as tolerated     Mobility  Bed Mobility Overal bed mobility: Needs Assistance Bed Mobility: Supine to Sit;Sit to Supine     Supine to sit: Max assist;+2 for physical assistance;HOB elevated;Total assist Sit to supine: Total assist;+2 for physical assistance   General bed mobility comments: Pt assisted to EOB with max to total assist of 2 persons. Pt total assist initially as he leans posteriorly and  to his right with pt resisting movement and did not want to come to EOB. Pt did progress to min to mod assist sitting with cues and assist to sit better at EOB. Pt did  move LEs to command sitting at EOB today moving right LE better than left.    Transfers                 General transfer comment: Unable at this time  Ambulation/Gait                 Stairs             Wheelchair Mobility    Modified Rankin (Stroke Patients Only)       Balance Overall balance assessment: Needs assistance Sitting-balance support: Bilateral upper extremity supported;Feet supported Sitting balance-Leahy Scale: Zero Sitting balance - Comments: Pt sat eOB 10 min with periods of min assist with cues and assist to get pt to upright sitting.  Pt leans to right and has difficulty using UEs effectively to assist with balance even with assist and cues. Tried multiple methods to get pt comfortable with sitting unsupported such as propping on elbow, reach ing forward on back of chair in front of pt (did best with this) but continued to need min assist at best.                                    Cognition Arousal/Alertness: Awake/alert Behavior During Therapy: Anxious Overall Cognitive Status: No family/caregiver present to determine baseline cognitive functioning  Exercises General Exercises - Lower Extremity Ankle Circles/Pumps: AROM;Both;10 reps;Supine Quad Sets: AROM;Both;10 reps;Supine Heel Slides: AAROM;Both;5 reps;Supine    General Comments        Pertinent Vitals/Pain Pain Assessment: Faces Faces Pain Scale: Hurts worst Pain Location: left hip/leg Pain Descriptors / Indicators: Aching;Discomfort;Grimacing;Guarding Pain Intervention(s): Limited activity within patient's tolerance;Monitored during session;Repositioned;Premedicated before session (Pt had pain medicine and Haldol prior to session)    Home  Living                      Prior Function            PT Goals (current goals can now be found in the care plan section) Acute Rehab PT Goals Patient Stated Goal: to get rid of pain Progress towards PT goals: Not progressing toward goals - comment (incr pain and self limiting)    Frequency    Min 3X/week      PT Plan Current plan remains appropriate    Co-evaluation              AM-PAC PT "6 Clicks" Mobility   Outcome Measure  Help needed turning from your back to your side while in a flat bed without using bedrails?: Total Help needed moving from lying on your back to sitting on the side of a flat bed without using bedrails?: Total Help needed moving to and from a bed to a chair (including a wheelchair)?: Total Help needed standing up from a chair using your arms (e.g., wheelchair or bedside chair)?: Total Help needed to walk in hospital room?: Total Help needed climbing 3-5 steps with a railing? : Total 6 Click Score: 6    End of Session Equipment Utilized During Treatment: Gait belt Activity Tolerance: Patient limited by fatigue;Patient limited by pain Patient left: in bed;with call bell/phone within reach;with bed alarm set Nurse Communication: Mobility status;Need for lift equipment PT Visit Diagnosis: Unsteadiness on feet (R26.81);Muscle weakness (generalized) (M62.81);Pain Pain - Right/Left: Left Pain - part of body: Hip     Time: 1200-1227 PT Time Calculation (min) (ACUTE ONLY): 27 min  Charges:  $Therapeutic Exercise: 8-22 mins $Therapeutic Activity: 8-22 mins                     Felicia Bloomquist M,PT Acute Rehab Services [phone] (pager)    Alvira Philips 01/04/2021, 1:16 PM

## 2021-01-04 NOTE — Plan of Care (Signed)

## 2021-01-05 DIAGNOSIS — F419 Anxiety disorder, unspecified: Secondary | ICD-10-CM | POA: Diagnosis not present

## 2021-01-05 DIAGNOSIS — I2510 Atherosclerotic heart disease of native coronary artery without angina pectoris: Secondary | ICD-10-CM | POA: Diagnosis not present

## 2021-01-05 DIAGNOSIS — S72002G Fracture of unspecified part of neck of left femur, subsequent encounter for closed fracture with delayed healing: Secondary | ICD-10-CM | POA: Diagnosis not present

## 2021-01-05 DIAGNOSIS — I10 Essential (primary) hypertension: Secondary | ICD-10-CM | POA: Diagnosis not present

## 2021-01-05 DIAGNOSIS — E119 Type 2 diabetes mellitus without complications: Secondary | ICD-10-CM | POA: Diagnosis not present

## 2021-01-05 DIAGNOSIS — F411 Generalized anxiety disorder: Secondary | ICD-10-CM | POA: Diagnosis not present

## 2021-01-05 LAB — BASIC METABOLIC PANEL
Anion gap: 8 (ref 5–15)
BUN: 17 mg/dL (ref 8–23)
CO2: 22 mmol/L (ref 22–32)
Calcium: 8.1 mg/dL — ABNORMAL LOW (ref 8.9–10.3)
Chloride: 101 mmol/L (ref 98–111)
Creatinine, Ser: 1.12 mg/dL (ref 0.61–1.24)
GFR, Estimated: >60 mL/min (ref >60)
Glucose, Bld: 104 mg/dL — ABNORMAL HIGH (ref 70–99)
Potassium: 3.8 mmol/L (ref 3.5–5.1)
Sodium: 131 mmol/L — ABNORMAL LOW (ref 135–145)

## 2021-01-05 LAB — CBC
HCT: 27.5 % — ABNORMAL LOW (ref 39.0–52.0)
Hemoglobin: 9 g/dL — ABNORMAL LOW (ref 13.0–17.0)
MCH: 28.9 pg (ref 26.0–34.0)
MCHC: 32.7 g/dL (ref 30.0–36.0)
MCV: 88.4 fL (ref 80.0–100.0)
Platelets: 288 10*3/uL (ref 150–400)
RBC: 3.11 MIL/uL — ABNORMAL LOW (ref 4.22–5.81)
RDW: 13.4 % (ref 11.5–15.5)
WBC: 14.5 10*3/uL — ABNORMAL HIGH (ref 4.0–10.5)
nRBC: 0 % (ref 0.0–0.2)

## 2021-01-05 LAB — GLUCOSE, CAPILLARY
Glucose-Capillary: 104 mg/dL — ABNORMAL HIGH (ref 70–99)
Glucose-Capillary: 275 mg/dL — ABNORMAL HIGH (ref 70–99)
Glucose-Capillary: 251 mg/dL — ABNORMAL HIGH (ref 70–99)
Glucose-Capillary: 270 mg/dL — ABNORMAL HIGH (ref 70–99)

## 2021-01-05 LAB — MAGNESIUM: Magnesium: 2 mg/dL (ref 1.7–2.4)

## 2021-01-05 MED ORDER — SODIUM CHLORIDE 0.9 % IV SOLN
1.0000 g | Freq: Two times a day (BID) | INTRAVENOUS | Status: DC
  Administered 2021-01-05 – 2021-01-08 (×6): 1 g via INTRAVENOUS
  Filled 2021-01-05 (×8): qty 1

## 2021-01-05 NOTE — Progress Notes (Signed)
PROGRESS NOTE    Jason Hartman  VQX:[id number] DOB: May 26, 1935 DOA: 12/29/2020 PCP: [name], MD   Brief Narrative:  85 year old with history of HTN, OSA, RLS and DM2 presented with fall from SNF.  He has been at [facility] for deconditioning after severe UTI.  He fell after attempting to transfer from wheelchair to bed landed on his hip and found to have left hip fracture.  Patient was quite delirious in the hospital therefore UA was checked which was positive for UTI but there was also concerns for underlying delirium therefore psychiatry team was consulted.   Assessment & Plan:   Principal Problem:   Hip fracture ([place]) Active Problems:   Type II diabetes mellitus ([place])   OBSTRUCTIVE SLEEP APNEA   Essential hypertension   Atherosclerotic heart disease of native coronary artery without angina pectoris   Anxiety   PAF (paroxysmal atrial fibrillation) (HCC)   Delirium  ? Mild cognitive impairment - Delirium precaution.  Combination of sundowning and underlying infection/UTI.  Psychiatry consulted who has reduced his Klonopin, started on olanzapine.  Continue to monitor him.  Urinary tract infection - Previously he is growing resistant Klebsiella.  We will continue IVP meropenem.  Monitor urine cultures   Left hip fracture status post left hemiarthroplasty 9/24 -Pain control, PT/OT - Continue home Eliquis.  Weightbearing as tolerated.   Hyponatremia -Continue gentle hydration   Right testicular pain due to multiseptated cyst; 5cm.  -Ultrasound-unremarkable.  Previous hospitalist spoke with neurology who recommends outpatient follow-up at [facility]. Follow up US in 3 months.    CAD status post PCI -Currently chest pain-free   Chronic atrial fibrillation -On amiodarone and Lopressor   Mood disorder -Elavil, Cymbalta, melatonin, clonazepam   HTN -Lopressor   Diabetes mellitus type 2, insulin-dependent Peripheral neuropathy -A1c 7.2 -Continue Lantus, sliding  scale and Accu-Cheks - Continue Neurontin at bedtime   OSA -Continue CPAP    DVT prophylaxis: Eliquis  Code Status: Full  Family Communication:  Called wife   Status is: Inpatient  Remains inpatient appropriate because:Inpatient level of care appropriate due to severity of illness. Awaiting Ucx data, on IV meropenem.   Dispo: The patient is from: Home              Anticipated d/c is to: SNF; [facility].               Patient currently is not medically stable to d/c.   Difficult to place patient No    Nutritional status  Nutrition Problem: Moderate Malnutrition Etiology: chronic illness (CAD)  Signs/Symptoms: moderate fat depletion, moderate muscle depletion, severe muscle depletion, percent weight loss Percent weight loss: 15 %  Interventions: Ensure Enlive (each supplement provides 350kcal and 20 grams of protein), MVI  Body mass index is 21.52 kg/m.       Subjective: Feels ok but has burst of screaming.   Review of Systems Otherwise negative except as per HPI, including: Difficult to obtain from him.    Examination: Constitutional: Not in acute distress Respiratory: Clear to auscultation bilaterally Cardiovascular: Normal sinus rhythm, no rubs Abdomen: Nontender nondistended good bowel sounds Musculoskeletal: No edema noted Skin: No rashes seen Neurologic: CN 2-12 grossly intact.  And nonfocal Psychiatric: poor judgment and insight. Alert and oriented x 1   Objective: Vitals:   01/04/21 0837 01/04/21 1422 01/04/21 1956 01/05/21 0842  BP: 134/82 113/61 135/67 (!) 147/63  Pulse: 89 86 90 91  Resp:  17 20 17   Temp: 98.5 F (36.9  C) 97.6 F (36.4 C) 99.4 F (37.4 C) 98.3 F (36.8 C)  TempSrc: Oral Oral Oral Oral  SpO2: 100% 100% 96% 99%  Weight:      Height:        Intake/Output Summary (Last 24 hours) at 01/05/2021 1119 Last data filed at 01/04/2021 1750 Gross per 24 hour  Intake 580 ml  Output 550 ml  Net 30 ml   Filed Weights    12/29/20 1249  Weight: 68 kg     Data Reviewed:   CBC: Recent Labs  Lab 12/31/20 0352 01/02/21 0117 01/03/21 0352 01/04/21 0553 01/05/21 0312  WBC 18.4* 15.4* 12.1* 12.1* 14.5*  NEUTROABS 15.9* 12.8* 9.3*  --   --   HGB 10.4* 9.7* 9.5* 8.7* 9.0*  HCT 31.3* 29.9* 28.3* 26.6* 27.5*  MCV 88.7 89.3 88.2 88.7 88.4  PLT 233 192 211 218 416   Basic Metabolic Panel: Recent Labs  Lab 12/31/20 0352 01/02/21 0117 01/03/21 0352 01/04/21 0553 01/05/21 0312  NA 132* 128* 128* 129* 131*  K 4.4 4.2 3.7 3.8 3.8  CL 98 94* 95* 98 101  CO2 23 25 26 22 22   GLUCOSE 183* 130* 169* 135* 104*  BUN 20 25* 22 17 17   CREATININE 1.22 1.12 1.09 0.97 1.12  CALCIUM 8.7* 8.7* 8.3* 8.3* 8.1*  MG 1.5* 2.0 2.0 1.8 2.0  PHOS 3.5 3.7 3.9  --   --    GFR: Estimated Creatinine Clearance: 48.9 mL/min (by C-G formula based on SCr of 1.12 mg/dL). Liver Function Tests: Recent Labs  Lab 12/31/20 0352 01/02/21 0117 01/03/21 0352  AST 20 16 28   ALT 10 11 18   ALKPHOS 68 81 88  BILITOT 1.2 0.9 0.9  PROT 6.4* 6.3* 5.9*  ALBUMIN 2.6* 2.2* 1.9*   No results for input(s): LIPASE, AMYLASE in the last 168 hours. No results for input(s): AMMONIA in the last 168 hours. Coagulation Profile: No results for input(s): INR, PROTIME in the last 168 hours. Cardiac Enzymes: No results for input(s): CKTOTAL, CKMB, CKMBINDEX, TROPONINI in the last 168 hours. BNP (last 3 results) No results for input(s): PROBNP in the last 8760 hours. HbA1C: No results for input(s): HGBA1C in the last 72 hours. CBG: Recent Labs  Lab 01/04/21 0635 01/04/21 1143 01/04/21 1628 01/04/21 2019 01/05/21 0636  GLUCAP 131* 210* 146* 165* 104*   Lipid Profile: No results for input(s): CHOL, HDL, LDLCALC, TRIG, CHOLHDL, LDLDIRECT in the last 72 hours. Thyroid Function Tests: No results for input(s): TSH, T4TOTAL, FREET4, T3FREE, THYROIDAB in the last 72 hours. Anemia Panel: No results for input(s): VITAMINB12, FOLATE, FERRITIN,  TIBC, IRON, RETICCTPCT in the last 72 hours. Sepsis Labs: No results for input(s): PROCALCITON, LATICACIDVEN in the last 168 hours.  Recent Results (from the past 240 hour(s))  Resp Panel by RT-PCR (Flu A&B, Covid) Nasopharyngeal Swab     Status: None   Collection Time: 12/29/20  2:26 PM   Specimen: Nasopharyngeal Swab; Nasopharyngeal(NP) swabs in vial transport medium  Result Value Ref Range Status   SARS Coronavirus 2 by RT PCR NEGATIVE NEGATIVE Final    Comment: (NOTE) SARS-CoV-2 target nucleic acids are NOT DETECTED.  The SARS-CoV-2 RNA is generally detectable in upper respiratory specimens during the acute phase of infection. The lowest concentration of SARS-CoV-2 viral copies this assay can detect is 138 copies/mL. A negative result does not preclude SARS-Cov-2 infection and should not be used as the sole basis for treatment or other patient management decisions. A negative result  may occur with  improper specimen collection/handling, submission of specimen other than nasopharyngeal swab, presence of viral mutation(s) within the areas targeted by this assay, and inadequate number of viral copies(<138 copies/mL). A negative result must be combined with clinical observations, patient history, and epidemiological information. The expected result is Negative.  Fact Sheet for Patients:  EntrepreneurPulse.com.au  Fact Sheet for Healthcare Providers:  IncredibleEmployment.be  This test is no t yet approved or cleared by the [place] FDA and  has been authorized for detection and/or diagnosis of SARS-CoV-2 by FDA under an Emergency Use Authorization (EUA). This EUA will remain  in effect (meaning this test can be used) for the duration of the COVID-19 declaration under Section 564(b)(1) of the Act, 21 U.S.C.section 360bbb-3(b)(1), unless the authorization is terminated  or revoked sooner.       Influenza A by PCR NEGATIVE NEGATIVE Final    Influenza B by PCR NEGATIVE NEGATIVE Final    Comment: (NOTE) The Xpert Xpress SARS-CoV-2/FLU/RSV plus assay is intended as an aid in the diagnosis of influenza from Nasopharyngeal swab specimens and should not be used as a sole basis for treatment. Nasal washings and aspirates are unacceptable for Xpert Xpress SARS-CoV-2/FLU/RSV testing.  Fact Sheet for Patients: EntrepreneurPulse.com.au  Fact Sheet for Healthcare Providers: IncredibleEmployment.be  This test is not yet approved or cleared by the [place] FDA and has been authorized for detection and/or diagnosis of SARS-CoV-2 by FDA under an Emergency Use Authorization (EUA). This EUA will remain in effect (meaning this test can be used) for the duration of the COVID-19 declaration under Section 564(b)(1) of the Act, 21 U.S.C. section 360bbb-3(b)(1), unless the authorization is terminated or revoked.  Performed at [facility], [place] [address] [place], [place] [address]   Surgical pcr screen     Status: Abnormal   Collection Time: 12/29/20 11:11 PM   Specimen: Nasal Mucosa; Nasal Swab  Result Value Ref Range Status   MRSA, PCR POSITIVE (A) NEGATIVE Corrected    Comment: RESULT CALLED TO, READ BACK BY AND VERIFIED WITH: RN SYLVIA M. 12/30/20@1 :00 BY TW    Staphylococcus aureus POSITIVE (A) NEGATIVE Corrected    Comment: (NOTE) The Xpert SA Assay (FDA approved for NASAL specimens in patients 50 years of age and older), is one component of a comprehensive surveillance program. It is not intended to diagnose infection nor to guide or monitor treatment. Performed at [facility], [place] [address] [place], [place] [address] CORRECTED ON 09/24 AT 0114: PREVIOUSLY REPORTED AS POSITIVE          Radiology Studies: No results found.      Scheduled Meds:  (feeding supplement) PROSource Plus  30 mL Oral TID BM   amiodarone  200 mg Oral Daily   apixaban  5 mg Oral BID    clonazePAM  0.25 mg Oral q morning   clonazePAM  0.5 mg Oral QHS   docusate sodium  100 mg Oral BID   gabapentin  300 mg Oral QHS   insulin aspart  0-15 Units Subcutaneous TID WC   insulin aspart  0-5 Units Subcutaneous QHS   insulin glargine-yfgn  10 Units Subcutaneous BID   melatonin  5 mg Oral QHS   metoprolol tartrate  12.5 mg Oral BID   multivitamin with minerals  1 tablet Oral Daily   OLANZapine  2.5 mg Oral QHS   polyethylene glycol  17 g Oral BID   Continuous Infusions:  meropenem (MERREM) IV 1 g (  01/04/21 2341)   methocarbamol (ROBAXIN) IV       LOS: 7 days   Time spent= 35 mins    Iceis Knab Arsenio Loader, MD [facility]  If 7PM-7AM, please contact night-coverage  01/05/2021, 11:19 AM

## 2021-01-05 NOTE — Plan of Care (Signed)

## 2021-01-05 NOTE — Progress Notes (Signed)
PHARMACY NOTE:  ANTIMICROBIAL RENAL DOSAGE ADJUSTMENT  Current antimicrobial regimen includes a mismatch between antimicrobial dosage and estimated renal function.  As per policy approved by the Pharmacy & Therapeutics and Medical Executive Committees, the antimicrobial dosage will be adjusted accordingly.  Current antimicrobial dosage:  meropenem 1g q8h  Indication: hx ESBL UTI  Renal Function:  Estimated Creatinine Clearance: 48.9 mL/min (by C-G formula based on SCr of 1.12 mg/dL).    Antimicrobial dosage has been changed to: meropenem 1g q12h  Additional comments:   Thank you for allowing pharmacy to be a part of this patient's care.  Cristela Felt, PharmD, BCPS Clinical Pharmacist 01/05/2021 12:17 PM

## 2021-01-05 NOTE — TOC Progression Note (Signed)
Transition of Care [facility]) - Initial/Assessment Note    Patient Details  Name: Jason Hartman MRN: [id number] Date of Birth: [birth year]  [facility] CM/SW Contact:    Milinda Antis, LCSWA Phone Number: 01/05/2021, 1:46 PM  Clinical Narrative:                 14:40-  CSW contacted admissions at [facility].  If the patient is medically ready, they can accept the patient on Monday.  Expected Discharge Plan: Skilled Nursing Facility Barriers to Discharge: Continued Medical Work up   Patient Goals and CMS Choice        Expected Discharge Plan and Services Expected Discharge Plan: [place] In-house Referral: Clinical Social Work     Living arrangements for the past 2 months: Single Family Home                                      Prior Living Arrangements/Services Living arrangements for the past 2 months: Single Family Home Lives with:: Self, Spouse Patient language and need for interpreter reviewed:: No        Need for Family Participation in Patient Care: Yes (Comment) Care giver support system in place?: Yes (comment)   Criminal Activity/Legal Involvement Pertinent to Current Situation/Hospitalization: No - Comment as needed  Activities of Daily Living Home Assistive Devices/Equipment: Cane (specify quad or straight) ADL Screening (condition at time of admission) Patient's cognitive ability adequate to safely complete daily activities?: Yes Is the patient deaf or have difficulty hearing?: No Does the patient have difficulty seeing, even when wearing glasses/contacts?: No Does the patient have difficulty concentrating, remembering, or making decisions?: Yes Patient able to express need for assistance with ADLs?: Yes Does the patient have difficulty dressing or bathing?: Yes Independently performs ADLs?: No Communication: Independent Dressing (OT): Needs assistance Is this a change from baseline?: Pre-admission  baseline Grooming: Needs assistance Is this a change from baseline?: Pre-admission baseline Feeding: Independent Bathing: Needs assistance Is this a change from baseline?: Pre-admission baseline Toileting: Needs assistance Is this a change from baseline?: Pre-admission baseline Walks in Home: Needs assistance Is this a change from baseline?: Pre-admission baseline Does the patient have difficulty walking or climbing stairs?: Yes Weakness of Legs: Both Weakness of Arms/Hands: Both  Permission Sought/Granted   Permission granted to share information with : Yes, Verbal Permission Granted  Share Information with NAME: Aedan Geimer     Permission granted to share info w Relationship: spouse  Permission granted to share info w Contact Information: [phone]  Emotional Assessment Appearance:: Appears stated age Attitude/Demeanor/Rapport: Unable to Assess (on and off sleeping)   Orientation: : Oriented to Self, Oriented to Place, Oriented to  Time, Oriented to Situation Alcohol / Substance Use: Not Applicable Psych Involvement: No (comment)  Admission diagnosis:  Hip fracture ([place]) [S72.009A] Fall [W19.XXXA] Patient Active Problem List   Diagnosis Date Noted   Hip fracture ([place]) 12/29/2020   Malnutrition of moderate degree 10/27/2020   UTI (urinary tract infection) 10/25/2020   Sepsis ([place]) 10/12/2020   AMS (altered mental status)    PAF (paroxysmal atrial fibrillation) ([place])    Acute metabolic encephalopathy 26/71/2458   Anxiety 09/09/2020   SIRS (systemic inflammatory response syndrome) ([place]) 09/09/2020   Diaphoresis    Slow transit constipation 08/07/2020   Abdominal aortic aneurysm without rupture ([place]) 03/17/2020   Abnormal gait 03/17/2020   Ascending aorta dilatation (  [place]) 03/17/2020   Diabetic renal disease ([place]) 03/17/2020   Recurrent UTI 07/16/2019   Hypertensive urgency 04/26/2019   Malignant HTN with heart disease, w/o CHF, w/o chronic kidney disease  04/26/2019   Elevated troponin    LFTs abnormal    Statin intolerance 01/15/2019   Penis pain 04/10/2018   Urethra cancer ([place]) 02/25/2018   Lower extremity edema 06/09/2017   Peptic ulcer disease 05/16/2017   GERD (gastroesophageal reflux disease) 05/16/2017   Chronic kidney disease, stage 3a ([place]) 04/11/2017   Lower urinary tract symptoms (LUTS) 04/15/2016   Narcotic dependence ([place]) 08/31/2015   Imbalance 08/31/2015   Diabetic peripheral neuropathy ([place]) 08/31/2015   Other malaise and fatigue 05/02/2015   Chronic fatigue 05/02/2015   Panic disorder without agoraphobia 03/22/2015   Hyperlipidemia 02/03/2014   Thrombocytopenia ([place]) 08/18/2013   Parkinsonism ([place]) 08/18/2013   Other disorders of lung 08/18/2013   Organic impotence 08/18/2013   Memory loss 08/18/2013   Low back pain 08/18/2013   Iron deficiency anemia 08/18/2013   Hypercalcemia 08/18/2013   Cellulitis of right leg 08/18/2013   Atherosclerotic heart disease of native coronary artery without angina pectoris 08/18/2013   RLS (restless legs syndrome) 09/01/2012   HERPES SIMPLEX INFECTION 11/06/2006   Type II diabetes mellitus ([place]) 11/06/2006   HYPOGONADISM 11/06/2006   VITAMIN D DEFICIENCY 11/06/2006   ANXIETY 11/06/2006   OBSTRUCTIVE SLEEP APNEA 11/06/2006   RESTLESS LEG SYNDROME 11/06/2006   Essential hypertension 11/06/2006   BENIGN PROSTATIC HYPERTROPHY 11/06/2006   ROSACEA 11/06/2006   OSTEOARTHRITIS 11/06/2006   INSOMNIA 11/06/2006   HYPERGLYCEMIA 11/06/2006   COLONIC POLYPS, HX OF 11/06/2006   PCP:  [name], MD Pharmacy:   [facility] - [place], [place] - [address] [address] [facility] [place] [address] Phone: [phone] Fax: [phone]     Social Determinants of Health (SDOH) Interventions    Readmission Risk Interventions No flowsheet data found.

## 2021-01-06 LAB — BASIC METABOLIC PANEL
Anion gap: 11 (ref 5–15)
BUN: 23 mg/dL (ref 8–23)
CO2: 19 mmol/L — ABNORMAL LOW (ref 22–32)
Calcium: 8.1 mg/dL — ABNORMAL LOW (ref 8.9–10.3)
Chloride: 100 mmol/L (ref 98–111)
Creatinine, Ser: 1.27 mg/dL — ABNORMAL HIGH (ref 0.61–1.24)
GFR, Estimated: 56 mL/min — ABNORMAL LOW (ref >60)
Glucose, Bld: 181 mg/dL — ABNORMAL HIGH (ref 70–99)
Potassium: 4.1 mmol/L (ref 3.5–5.1)
Sodium: 130 mmol/L — ABNORMAL LOW (ref 135–145)

## 2021-01-06 LAB — CBC
HCT: 31.2 % — ABNORMAL LOW (ref 39.0–52.0)
Hemoglobin: 10 g/dL — ABNORMAL LOW (ref 13.0–17.0)
MCH: 29 pg (ref 26.0–34.0)
MCHC: 32.1 g/dL (ref 30.0–36.0)
MCV: 90.4 fL (ref 80.0–100.0)
Platelets: 254 10*3/uL (ref 150–400)
RBC: 3.45 MIL/uL — ABNORMAL LOW (ref 4.22–5.81)
RDW: 13.7 % (ref 11.5–15.5)
WBC: 15.4 10*3/uL — ABNORMAL HIGH (ref 4.0–10.5)
nRBC: 0 % (ref 0.0–0.2)

## 2021-01-06 LAB — GLUCOSE, CAPILLARY
Glucose-Capillary: 205 mg/dL — ABNORMAL HIGH (ref 70–99)
Glucose-Capillary: 170 mg/dL — ABNORMAL HIGH (ref 70–99)
Glucose-Capillary: 211 mg/dL — ABNORMAL HIGH (ref 70–99)
Glucose-Capillary: 232 mg/dL — ABNORMAL HIGH (ref 70–99)

## 2021-01-06 LAB — MAGNESIUM: Magnesium: 2 mg/dL (ref 1.7–2.4)

## 2021-01-06 NOTE — Progress Notes (Signed)
PROGRESS NOTE    Jason Hartman  SEG:[id number] DOB: Oct 13, 1935 DOA: 12/29/2020 PCP: [name], MD   Brief Narrative:  85 year old with history of HTN, OSA, RLS and DM2 presented with fall from SNF.  He has been at [facility] for deconditioning after severe UTI.  He fell after attempting to transfer from wheelchair to bed landed on his hip and found to have left hip fracture.  Patient was quite delirious in the hospital therefore UA was checked which was positive for UTI but there was also concerns for underlying delirium therefore psychiatry team was consulted and medication adjustments were made.   Assessment & Plan:   Principal Problem:   Hip fracture ([place]) Active Problems:   Type II diabetes mellitus ([place])   OBSTRUCTIVE SLEEP APNEA   Essential hypertension   Atherosclerotic heart disease of native coronary artery without angina pectoris   Anxiety   PAF (paroxysmal atrial fibrillation) (HCC)   Delirium  ? Mild cognitive impairment - Delirium precaution.  Combination of sundowning and underlying infection/UTI.  Psychiatry consulted who has reduced his Klonopin, started on olanzapine.  Continue to monitor him.  Urinary tract infection - Previously he is growing resistant Klebsiella.  On IV meropenem, urine cultures pending   Left hip fracture status post left hemiarthroplasty 9/24 -Pain control, PT/OT - Continue home Eliquis.  Weightbearing as tolerated.   Hyponatremia -Gentle hydration as needed   Right testicular pain due to multiseptated cyst; 5cm.  -Ultrasound-unremarkable.  Previous hospitalist spoke with neurology who recommends outpatient follow-up at [facility]. Follow up US in 3 months.    CAD status post PCI -Currently chest pain-free   Chronic atrial fibrillation -On amiodarone and Lopressor   Mood disorder -Elavil, Cymbalta, melatonin, clonazepam   HTN -Lopressor   Diabetes mellitus type 2, insulin-dependent Peripheral neuropathy -A1c  7.2 -Continue Lantus, sliding scale and Accu-Cheks - Continue Neurontin at bedtime   OSA -Continue CPAP    DVT prophylaxis: Eliquis  Code Status: Full  Family Communication: Wife updated periodically  Status is: Inpatient  Remains inpatient appropriate because:Inpatient level of care appropriate due to severity of illness. Awaiting Ucx data, on IV meropenem.   Dispo: The patient is from: Home              Anticipated d/c is to: SNF; [facility].               Patient currently is not medically stable to d/c.   Difficult to place patient No    Nutritional status  Nutrition Problem: Moderate Malnutrition Etiology: chronic illness (CAD)  Signs/Symptoms: moderate fat depletion, moderate muscle depletion, severe muscle depletion, percent weight loss Percent weight loss: 15 %  Interventions: Ensure Enlive (each supplement provides 350kcal and 20 grams of protein), MVI  Body mass index is 21.52 kg/m.       Subjective: He is resting comfortably this morning does not have any complaints.  Review of Systems Otherwise negative except as per HPI, including: Difficult to obtain from him.    Examination: Constitutional: Sleeping but easily arousable Respiratory: Clear to auscultation bilaterally Cardiovascular: Normal sinus rhythm, no rubs Abdomen: Nontender nondistended good bowel sounds Musculoskeletal: No edema noted Skin: No rashes seen Neurologic: CN 2-12 grossly intact.  And nonfocal Psychiatric: Poor judgment and insight.  Alert awake to name only Objective: Vitals:   01/04/21 1956 01/05/21 0842 01/05/21 1452 01/05/21 2149  BP: 135/67 (!) 147/63 137/67 135/74  Pulse: 90 91 82 92  Resp: 20 17 17  16  Temp: 99.4 F (37.4 C) 98.3 F (36.8 C) (!) 97.5 F (36.4 C) (!) 97.5 F (36.4 C)  TempSrc: Oral Oral Oral Oral  SpO2: 96% 99% 95% 99%  Weight:      Height:        Intake/Output Summary (Last 24 hours) at 01/06/2021 0932 Last data filed at 01/06/2021  0550 Gross per 24 hour  Intake 830 ml  Output --  Net 830 ml   Filed Weights   12/29/20 1249  Weight: 68 kg     Data Reviewed:   CBC: Recent Labs  Lab 12/31/20 0352 01/02/21 0117 01/03/21 0352 01/04/21 0553 01/05/21 0312 01/06/21 0306  WBC 18.4* 15.4* 12.1* 12.1* 14.5* 15.4*  NEUTROABS 15.9* 12.8* 9.3*  --   --   --   HGB 10.4* 9.7* 9.5* 8.7* 9.0* 10.0*  HCT 31.3* 29.9* 28.3* 26.6* 27.5* 31.2*  MCV 88.7 89.3 88.2 88.7 88.4 90.4  PLT 233 192 211 218 288 423   Basic Metabolic Panel: Recent Labs  Lab 12/31/20 0352 01/02/21 0117 01/03/21 0352 01/04/21 0553 01/05/21 0312 01/06/21 0306  NA 132* 128* 128* 129* 131* 130*  K 4.4 4.2 3.7 3.8 3.8 4.1  CL 98 94* 95* 98 101 100  CO2 23 25 26 22 22  19*  GLUCOSE 183* 130* 169* 135* 104* 181*  BUN 20 25* 22 17 17 23   CREATININE 1.22 1.12 1.09 0.97 1.12 1.27*  CALCIUM 8.7* 8.7* 8.3* 8.3* 8.1* 8.1*  MG 1.5* 2.0 2.0 1.8 2.0 2.0  PHOS 3.5 3.7 3.9  --   --   --    GFR: Estimated Creatinine Clearance: 43.1 mL/min (A) (by C-G formula based on SCr of 1.27 mg/dL (H)). Liver Function Tests: Recent Labs  Lab 12/31/20 0352 01/02/21 0117 01/03/21 0352  AST 20 16 28   ALT 10 11 18   ALKPHOS 68 81 88  BILITOT 1.2 0.9 0.9  PROT 6.4* 6.3* 5.9*  ALBUMIN 2.6* 2.2* 1.9*   No results for input(s): LIPASE, AMYLASE in the last 168 hours. No results for input(s): AMMONIA in the last 168 hours. Coagulation Profile: No results for input(s): INR, PROTIME in the last 168 hours. Cardiac Enzymes: No results for input(s): CKTOTAL, CKMB, CKMBINDEX, TROPONINI in the last 168 hours. BNP (last 3 results) No results for input(s): PROBNP in the last 8760 hours. HbA1C: No results for input(s): HGBA1C in the last 72 hours. CBG: Recent Labs  Lab 01/05/21 0636 01/05/21 1155 01/05/21 1643 01/05/21 2154 01/06/21 0557  GLUCAP 104* 275* 251* 270* 205*   Lipid Profile: No results for input(s): CHOL, HDL, LDLCALC, TRIG, CHOLHDL, LDLDIRECT in the  last 72 hours. Thyroid Function Tests: No results for input(s): TSH, T4TOTAL, FREET4, T3FREE, THYROIDAB in the last 72 hours. Anemia Panel: No results for input(s): VITAMINB12, FOLATE, FERRITIN, TIBC, IRON, RETICCTPCT in the last 72 hours. Sepsis Labs: No results for input(s): PROCALCITON, LATICACIDVEN in the last 168 hours.  Recent Results (from the past 240 hour(s))  Resp Panel by RT-PCR (Flu A&B, Covid) Nasopharyngeal Swab     Status: None   Collection Time: 12/29/20  2:26 PM   Specimen: Nasopharyngeal Swab; Nasopharyngeal(NP) swabs in vial transport medium  Result Value Ref Range Status   SARS Coronavirus 2 by RT PCR NEGATIVE NEGATIVE Final    Comment: (NOTE) SARS-CoV-2 target nucleic acids are NOT DETECTED.  The SARS-CoV-2 RNA is generally detectable in upper respiratory specimens during the acute phase of infection. The lowest concentration of SARS-CoV-2 viral copies this assay can  detect is 138 copies/mL. A negative result does not preclude SARS-Cov-2 infection and should not be used as the sole basis for treatment or other patient management decisions. A negative result may occur with  improper specimen collection/handling, submission of specimen other than nasopharyngeal swab, presence of viral mutation(s) within the areas targeted by this assay, and inadequate number of viral copies(<138 copies/mL). A negative result must be combined with clinical observations, patient history, and epidemiological information. The expected result is Negative.  Fact Sheet for Patients:  EntrepreneurPulse.com.au  Fact Sheet for Healthcare Providers:  IncredibleEmployment.be  This test is no t yet approved or cleared by the [place] FDA and  has been authorized for detection and/or diagnosis of SARS-CoV-2 by FDA under an Emergency Use Authorization (EUA). This EUA will remain  in effect (meaning this test can be used) for the duration of  the COVID-19 declaration under Section 564(b)(1) of the Act, 21 U.S.C.section 360bbb-3(b)(1), unless the authorization is terminated  or revoked sooner.       Influenza A by PCR NEGATIVE NEGATIVE Final   Influenza B by PCR NEGATIVE NEGATIVE Final    Comment: (NOTE) The Xpert Xpress SARS-CoV-2/FLU/RSV plus assay is intended as an aid in the diagnosis of influenza from Nasopharyngeal swab specimens and should not be used as a sole basis for treatment. Nasal washings and aspirates are unacceptable for Xpert Xpress SARS-CoV-2/FLU/RSV testing.  Fact Sheet for Patients: EntrepreneurPulse.com.au  Fact Sheet for Healthcare Providers: IncredibleEmployment.be  This test is not yet approved or cleared by the [place] FDA and has been authorized for detection and/or diagnosis of SARS-CoV-2 by FDA under an Emergency Use Authorization (EUA). This EUA will remain in effect (meaning this test can be used) for the duration of the COVID-19 declaration under Section 564(b)(1) of the Act, 21 U.S.C. section 360bbb-3(b)(1), unless the authorization is terminated or revoked.  Performed at [facility], [place] [address], [place], [place] [address]   Surgical pcr screen     Status: Abnormal   Collection Time: 12/29/20 11:11 PM   Specimen: Nasal Mucosa; Nasal Swab  Result Value Ref Range Status   MRSA, PCR POSITIVE (A) NEGATIVE Corrected    Comment: RESULT CALLED TO, READ BACK BY AND VERIFIED WITH: RN SYLVIA M. 12/30/20@1 :00 BY TW    Staphylococcus aureus POSITIVE (A) NEGATIVE Corrected    Comment: (NOTE) The Xpert SA Assay (FDA approved for NASAL specimens in patients 62 years of age and older), is one component of a comprehensive surveillance program. It is not intended to diagnose infection nor to guide or monitor treatment. Performed at [facility], [place] [address], [place], [place] [address] CORRECTED ON 09/24 AT 0114: PREVIOUSLY REPORTED  AS POSITIVE          Radiology Studies: No results found.      Scheduled Meds:  (feeding supplement) PROSource Plus  30 mL Oral TID BM   amiodarone  200 mg Oral Daily   apixaban  5 mg Oral BID   clonazePAM  0.25 mg Oral q morning   clonazePAM  0.5 mg Oral QHS   docusate sodium  100 mg Oral BID   gabapentin  300 mg Oral QHS   insulin aspart  0-15 Units Subcutaneous TID WC   insulin aspart  0-5 Units Subcutaneous QHS   insulin glargine-yfgn  10 Units Subcutaneous BID   melatonin  5 mg Oral QHS   metoprolol tartrate  12.5 mg Oral BID   multivitamin with minerals  1 tablet Oral Daily   OLANZapine  2.5 mg Oral QHS   polyethylene glycol  17 g Oral BID   Continuous Infusions:  meropenem (MERREM) IV 1 g (01/06/21 0924)   methocarbamol (ROBAXIN) IV       LOS: 8 days   Time spent= 35 mins    Jamiaya Bina Arsenio Loader, MD [facility]  If 7PM-7AM, please contact night-coverage  01/06/2021, 9:32 AM

## 2021-01-06 NOTE — Plan of Care (Signed)

## 2021-01-06 NOTE — Plan of Care (Signed)
  Problem: Nutrition: Goal: Adequate nutrition will be maintained Outcome: Progressing   Problem: Pain Managment: Goal: General experience of comfort will improve Outcome: Progressing   Problem: Safety: Goal: Ability to remain free from injury will improve Outcome: Progressing   

## 2021-01-07 DIAGNOSIS — N390 Urinary tract infection, site not specified: Secondary | ICD-10-CM

## 2021-01-07 LAB — BASIC METABOLIC PANEL
Anion gap: 8 (ref 5–15)
BUN: 20 mg/dL (ref 8–23)
CO2: 23 mmol/L (ref 22–32)
Calcium: 7.7 mg/dL — ABNORMAL LOW (ref 8.9–10.3)
Chloride: 98 mmol/L (ref 98–111)
Creatinine, Ser: 0.94 mg/dL (ref 0.61–1.24)
GFR, Estimated: >60 mL/min (ref >60)
Glucose, Bld: 197 mg/dL — ABNORMAL HIGH (ref 70–99)
Potassium: 3.8 mmol/L (ref 3.5–5.1)
Sodium: 129 mmol/L — ABNORMAL LOW (ref 135–145)

## 2021-01-07 LAB — GLUCOSE, CAPILLARY
Glucose-Capillary: 165 mg/dL — ABNORMAL HIGH (ref 70–99)
Glucose-Capillary: 253 mg/dL — ABNORMAL HIGH (ref 70–99)
Glucose-Capillary: 309 mg/dL — ABNORMAL HIGH (ref 70–99)

## 2021-01-07 LAB — CBC
HCT: 26.6 % — ABNORMAL LOW (ref 39.0–52.0)
Hemoglobin: 8.6 g/dL — ABNORMAL LOW (ref 13.0–17.0)
MCH: 28.8 pg (ref 26.0–34.0)
MCHC: 32.3 g/dL (ref 30.0–36.0)
MCV: 89 fL (ref 80.0–100.0)
Platelets: 324 10*3/uL (ref 150–400)
RBC: 2.99 MIL/uL — ABNORMAL LOW (ref 4.22–5.81)
RDW: 13.6 % (ref 11.5–15.5)
WBC: 13.5 10*3/uL — ABNORMAL HIGH (ref 4.0–10.5)
nRBC: 0 % (ref 0.0–0.2)

## 2021-01-07 LAB — MAGNESIUM: Magnesium: 1.9 mg/dL (ref 1.7–2.4)

## 2021-01-07 NOTE — Progress Notes (Signed)
PROGRESS NOTE    Jason Hartman  OZH:[id number] DOB: 06-04-1935 DOA: 12/29/2020 PCP: [name], MD   Brief Narrative:  85 year old with history of HTN, OSA, RLS and DM2 presented with fall from SNF.  He has been at [facility] for deconditioning after severe UTI.  He fell after attempting to transfer from wheelchair to bed landed on his hip and found to have left hip fracture.  Patient was quite delirious in the hospital therefore UA was checked which was positive for UTI but there was also concerns for underlying delirium therefore psychiatry team was consulted and medication adjustments were made.   Assessment & Plan:   Principal Problem:   Hip fracture ([place]) Active Problems:   Type II diabetes mellitus ([place])   OBSTRUCTIVE SLEEP APNEA   Essential hypertension   Atherosclerotic heart disease of native coronary artery without angina pectoris   Anxiety   PAF (paroxysmal atrial fibrillation) (HCC)   Delirium  ? Mild cognitive impairment - Delirium precaution.  Combination of sundowning and underlying infection/UTI.  Psychiatry consulted who has reduced his Klonopin, currently on olanzapine.  Urinary tract infection - Previously he is growing resistant Klebsiella.  IV meropenem, pending urine cultures   Left hip fracture status post left hemiarthroplasty 9/24 -Pain control, PT/OT - Continue home Eliquis.  Weightbearing as tolerated.   Hyponatremia -Gentle hydration as needed   Right testicular pain due to multiseptated cyst; 5cm.  -Ultrasound-unremarkable.  Previous hospitalist spoke with neurology who recommends outpatient follow-up at [facility]. Follow up US in 3 months.    CAD status post PCI -Currently chest pain-free   Chronic atrial fibrillation -On amiodarone and Lopressor   Mood disorder -Elavil, Cymbalta, melatonin, clonazepam   HTN -Lopressor   Diabetes mellitus type 2, insulin-dependent Peripheral neuropathy -A1c 7.2 -Continue Lantus, sliding  scale and Accu-Cheks - Continue Neurontin at bedtime   OSA -Continue CPAP    DVT prophylaxis: Eliquis  Code Status: Full  Family Communication: Wife updated  Status is: Inpatient  Remains inpatient appropriate because:Inpatient level of care appropriate due to severity of illness. Awaiting Ucx data, on IV meropenem.   Dispo: The patient is from: Home              Anticipated d/c is to: SNF; [facility].               Patient currently is not medically stable to d/c.   Difficult to place patient No    Nutritional status  Nutrition Problem: Moderate Malnutrition Etiology: chronic illness (CAD)  Signs/Symptoms: moderate fat depletion, moderate muscle depletion, severe muscle depletion, percent weight loss Percent weight loss: 15 %  Interventions: Ensure Enlive (each supplement provides 350kcal and 20 grams of protein), MVI  Body mass index is 21.52 kg/m.       Subjective: No acute events overnight.  Off-and-on screaming in his room  Review of Systems Otherwise negative except as per HPI, including: General: Denies fever, chills, night sweats or unintended weight loss. Resp: Denies cough, wheezing, shortness of breath. Cardiac: Denies chest pain, palpitations, orthopnea, paroxysmal nocturnal dyspnea. GI: Denies abdominal pain, nausea, vomiting, diarrhea or constipation GU: Denies dysuria, frequency, hesitancy or incontinence MS: Denies muscle aches, joint pain or swelling Neuro: Denies headache, neurologic deficits (focal weakness, numbness, tingling), abnormal gait Psych: Denies anxiety, depression, SI/HI/AVH Skin: Denies new rashes or lesions ID: Denies sick contacts, exotic exposures, travel   Examination: Constitutional: Not in acute distress Respiratory: Clear to auscultation bilaterally Cardiovascular: Normal sinus rhythm, no rubs  Abdomen: Nontender nondistended good bowel sounds Musculoskeletal: No edema noted Skin: No rashes seen Neurologic: CN  2-12 grossly intact.  And nonfocal Psychiatric: Alert to name and place.  Poor judgment and insight. Objective: Vitals:   01/05/21 1452 01/05/21 2149 01/06/21 1500 01/06/21 2250  BP: 137/67 135/74 (!) 117/58 (!) 142/64  Pulse: 82 92 80 86  Resp: 17 16 18    Temp: (!) 97.5 F (36.4 C) (!) 97.5 F (36.4 C) 98.6 F (37 C) 99.6 F (37.6 C)  TempSrc: Oral Oral Oral Oral  SpO2: 95% 99% 96% 99%  Weight:      Height:        Intake/Output Summary (Last 24 hours) at 01/07/2021 0916 Last data filed at 01/07/2021 0960 Gross per 24 hour  Intake 480 ml  Output 850 ml  Net -370 ml   Filed Weights   12/29/20 1249  Weight: 68 kg     Data Reviewed:   CBC: Recent Labs  Lab 01/02/21 0117 01/03/21 0352 01/04/21 0553 01/05/21 0312 01/06/21 0306 01/07/21 0322  WBC 15.4* 12.1* 12.1* 14.5* 15.4* 13.5*  NEUTROABS 12.8* 9.3*  --   --   --   --   HGB 9.7* 9.5* 8.7* 9.0* 10.0* 8.6*  HCT 29.9* 28.3* 26.6* 27.5* 31.2* 26.6*  MCV 89.3 88.2 88.7 88.4 90.4 89.0  PLT 192 211 218 288 254 454   Basic Metabolic Panel: Recent Labs  Lab 01/02/21 0117 01/03/21 0352 01/04/21 0553 01/05/21 0312 01/06/21 0306 01/07/21 0322  NA 128* 128* 129* 131* 130* 129*  K 4.2 3.7 3.8 3.8 4.1 3.8  CL 94* 95* 98 101 100 98  CO2 25 26 22 22  19* 23  GLUCOSE 130* 169* 135* 104* 181* 197*  BUN 25* 22 17 17 23 20   CREATININE 1.12 1.09 0.97 1.12 1.27* 0.94  CALCIUM 8.7* 8.3* 8.3* 8.1* 8.1* 7.7*  MG 2.0 2.0 1.8 2.0 2.0 1.9  PHOS 3.7 3.9  --   --   --   --    GFR: Estimated Creatinine Clearance: 58.3 mL/min (by C-G formula based on SCr of 0.94 mg/dL). Liver Function Tests: Recent Labs  Lab 01/02/21 0117 01/03/21 0352  AST 16 28  ALT 11 18  ALKPHOS 81 88  BILITOT 0.9 0.9  PROT 6.3* 5.9*  ALBUMIN 2.2* 1.9*   No results for input(s): LIPASE, AMYLASE in the last 168 hours. No results for input(s): AMMONIA in the last 168 hours. Coagulation Profile: No results for input(s): INR, PROTIME in the last 168  hours. Cardiac Enzymes: No results for input(s): CKTOTAL, CKMB, CKMBINDEX, TROPONINI in the last 168 hours. BNP (last 3 results) No results for input(s): PROBNP in the last 8760 hours. HbA1C: No results for input(s): HGBA1C in the last 72 hours. CBG: Recent Labs  Lab 01/05/21 2154 01/06/21 0557 01/06/21 1204 01/06/21 1625 01/06/21 2300  GLUCAP 270* 205* 170* 211* 232*   Lipid Profile: No results for input(s): CHOL, HDL, LDLCALC, TRIG, CHOLHDL, LDLDIRECT in the last 72 hours. Thyroid Function Tests: No results for input(s): TSH, T4TOTAL, FREET4, T3FREE, THYROIDAB in the last 72 hours. Anemia Panel: No results for input(s): VITAMINB12, FOLATE, FERRITIN, TIBC, IRON, RETICCTPCT in the last 72 hours. Sepsis Labs: No results for input(s): PROCALCITON, LATICACIDVEN in the last 168 hours.  Recent Results (from the past 240 hour(s))  Resp Panel by RT-PCR (Flu A&B, Covid) Nasopharyngeal Swab     Status: None   Collection Time: 12/29/20  2:26 PM   Specimen: Nasopharyngeal Swab; Nasopharyngeal(NP)  swabs in vial transport medium  Result Value Ref Range Status   SARS Coronavirus 2 by RT PCR NEGATIVE NEGATIVE Final    Comment: (NOTE) SARS-CoV-2 target nucleic acids are NOT DETECTED.  The SARS-CoV-2 RNA is generally detectable in upper respiratory specimens during the acute phase of infection. The lowest concentration of SARS-CoV-2 viral copies this assay can detect is 138 copies/mL. A negative result does not preclude SARS-Cov-2 infection and should not be used as the sole basis for treatment or other patient management decisions. A negative result may occur with  improper specimen collection/handling, submission of specimen other than nasopharyngeal swab, presence of viral mutation(s) within the areas targeted by this assay, and inadequate number of viral copies(<138 copies/mL). A negative result must be combined with clinical observations, patient history, and  epidemiological information. The expected result is Negative.  Fact Sheet for Patients:  EntrepreneurPulse.com.au  Fact Sheet for Healthcare Providers:  IncredibleEmployment.be  This test is no t yet approved or cleared by the [place] FDA and  has been authorized for detection and/or diagnosis of SARS-CoV-2 by FDA under an Emergency Use Authorization (EUA). This EUA will remain  in effect (meaning this test can be used) for the duration of the COVID-19 declaration under Section 564(b)(1) of the Act, 21 U.S.C.section 360bbb-3(b)(1), unless the authorization is terminated  or revoked sooner.       Influenza A by PCR NEGATIVE NEGATIVE Final   Influenza B by PCR NEGATIVE NEGATIVE Final    Comment: (NOTE) The Xpert Xpress SARS-CoV-2/FLU/RSV plus assay is intended as an aid in the diagnosis of influenza from Nasopharyngeal swab specimens and should not be used as a sole basis for treatment. Nasal washings and aspirates are unacceptable for Xpert Xpress SARS-CoV-2/FLU/RSV testing.  Fact Sheet for Patients: EntrepreneurPulse.com.au  Fact Sheet for Healthcare Providers: IncredibleEmployment.be  This test is not yet approved or cleared by the [place] FDA and has been authorized for detection and/or diagnosis of SARS-CoV-2 by FDA under an Emergency Use Authorization (EUA). This EUA will remain in effect (meaning this test can be used) for the duration of the COVID-19 declaration under Section 564(b)(1) of the Act, 21 U.S.C. section 360bbb-3(b)(1), unless the authorization is terminated or revoked.  Performed at [facility], [place] [address], [place], [place] [address]   Surgical pcr screen     Status: Abnormal   Collection Time: 12/29/20 11:11 PM   Specimen: Nasal Mucosa; Nasal Swab  Result Value Ref Range Status   MRSA, PCR POSITIVE (A) NEGATIVE Corrected    Comment: RESULT CALLED TO, READ  BACK BY AND VERIFIED WITH: RN SYLVIA M. 12/30/20@1 :00 BY TW    Staphylococcus aureus POSITIVE (A) NEGATIVE Corrected    Comment: (NOTE) The Xpert SA Assay (FDA approved for NASAL specimens in patients 2 years of age and older), is one component of a comprehensive surveillance program. It is not intended to diagnose infection nor to guide or monitor treatment. Performed at [facility], [place] [address] [place], [place] [address] CORRECTED ON 09/24 AT 0114: PREVIOUSLY REPORTED AS POSITIVE          Radiology Studies: No results found.      Scheduled Meds:  (feeding supplement) PROSource Plus  30 mL Oral TID BM   amiodarone  200 mg Oral Daily   apixaban  5 mg Oral BID   clonazePAM  0.25 mg Oral q morning   clonazePAM  0.5 mg Oral QHS   docusate sodium  100 mg  Oral BID   gabapentin  300 mg Oral QHS   insulin aspart  0-15 Units Subcutaneous TID WC   insulin aspart  0-5 Units Subcutaneous QHS   insulin glargine-yfgn  10 Units Subcutaneous BID   melatonin  5 mg Oral QHS   metoprolol tartrate  12.5 mg Oral BID   multivitamin with minerals  1 tablet Oral Daily   OLANZapine  2.5 mg Oral QHS   polyethylene glycol  17 g Oral BID   Continuous Infusions:  meropenem (MERREM) IV 1 g (01/07/21 0813)   methocarbamol (ROBAXIN) IV       LOS: 9 days   Time spent= 35 mins    Andreas Sobolewski Arsenio Loader, MD [facility]  If 7PM-7AM, please contact night-coverage  01/07/2021, 9:16 AM

## 2021-01-08 ENCOUNTER — Inpatient Hospital Stay (HOSPITAL_COMMUNITY): Payer: Medicare Other

## 2021-01-08 DIAGNOSIS — G9340 Encephalopathy, unspecified: Secondary | ICD-10-CM | POA: Diagnosis not present

## 2021-01-08 DIAGNOSIS — Z96642 Presence of left artificial hip joint: Secondary | ICD-10-CM | POA: Diagnosis not present

## 2021-01-08 DIAGNOSIS — M9702XD Periprosthetic fracture around internal prosthetic left hip joint, subsequent encounter: Secondary | ICD-10-CM | POA: Diagnosis not present

## 2021-01-08 DIAGNOSIS — N1831 Chronic kidney disease, stage 3a: Secondary | ICD-10-CM | POA: Diagnosis not present

## 2021-01-08 DIAGNOSIS — R1312 Dysphagia, oropharyngeal phase: Secondary | ICD-10-CM | POA: Diagnosis not present

## 2021-01-08 DIAGNOSIS — F5101 Primary insomnia: Secondary | ICD-10-CM | POA: Diagnosis not present

## 2021-01-08 DIAGNOSIS — R509 Fever, unspecified: Secondary | ICD-10-CM | POA: Diagnosis not present

## 2021-01-08 DIAGNOSIS — N390 Urinary tract infection, site not specified: Secondary | ICD-10-CM | POA: Diagnosis not present

## 2021-01-08 DIAGNOSIS — E119 Type 2 diabetes mellitus without complications: Secondary | ICD-10-CM | POA: Diagnosis not present

## 2021-01-08 DIAGNOSIS — T8142XA Infection following a procedure, deep incisional surgical site, initial encounter: Secondary | ICD-10-CM | POA: Diagnosis not present

## 2021-01-08 DIAGNOSIS — E782 Mixed hyperlipidemia: Secondary | ICD-10-CM | POA: Diagnosis not present

## 2021-01-08 DIAGNOSIS — R296 Repeated falls: Secondary | ICD-10-CM | POA: Diagnosis not present

## 2021-01-08 DIAGNOSIS — R29898 Other symptoms and signs involving the musculoskeletal system: Secondary | ICD-10-CM | POA: Diagnosis not present

## 2021-01-08 DIAGNOSIS — Z4789 Encounter for other orthopedic aftercare: Secondary | ICD-10-CM | POA: Diagnosis not present

## 2021-01-08 DIAGNOSIS — M25551 Pain in right hip: Secondary | ICD-10-CM | POA: Diagnosis not present

## 2021-01-08 DIAGNOSIS — W19XXXA Unspecified fall, initial encounter: Secondary | ICD-10-CM | POA: Diagnosis not present

## 2021-01-08 DIAGNOSIS — R5381 Other malaise: Secondary | ICD-10-CM | POA: Diagnosis not present

## 2021-01-08 DIAGNOSIS — J0130 Acute sphenoidal sinusitis, unspecified: Secondary | ICD-10-CM | POA: Diagnosis not present

## 2021-01-08 DIAGNOSIS — I739 Peripheral vascular disease, unspecified: Secondary | ICD-10-CM | POA: Diagnosis not present

## 2021-01-08 DIAGNOSIS — Z7401 Bed confinement status: Secondary | ICD-10-CM | POA: Diagnosis not present

## 2021-01-08 DIAGNOSIS — F4323 Adjustment disorder with mixed anxiety and depressed mood: Secondary | ICD-10-CM | POA: Diagnosis not present

## 2021-01-08 DIAGNOSIS — M6281 Muscle weakness (generalized): Secondary | ICD-10-CM | POA: Diagnosis not present

## 2021-01-08 DIAGNOSIS — I2510 Atherosclerotic heart disease of native coronary artery without angina pectoris: Secondary | ICD-10-CM | POA: Diagnosis not present

## 2021-01-08 DIAGNOSIS — Z87891 Personal history of nicotine dependence: Secondary | ICD-10-CM | POA: Diagnosis not present

## 2021-01-08 DIAGNOSIS — J9811 Atelectasis: Secondary | ICD-10-CM | POA: Diagnosis not present

## 2021-01-08 DIAGNOSIS — S79912A Unspecified injury of left hip, initial encounter: Secondary | ICD-10-CM | POA: Diagnosis not present

## 2021-01-08 DIAGNOSIS — F05 Delirium due to known physiological condition: Secondary | ICD-10-CM | POA: Diagnosis not present

## 2021-01-08 DIAGNOSIS — W06XXXA Fall from bed, initial encounter: Secondary | ICD-10-CM | POA: Diagnosis not present

## 2021-01-08 DIAGNOSIS — S79911A Unspecified injury of right hip, initial encounter: Secondary | ICD-10-CM | POA: Diagnosis not present

## 2021-01-08 DIAGNOSIS — E1122 Type 2 diabetes mellitus with diabetic chronic kidney disease: Secondary | ICD-10-CM | POA: Diagnosis not present

## 2021-01-08 DIAGNOSIS — A419 Sepsis, unspecified organism: Secondary | ICD-10-CM | POA: Diagnosis not present

## 2021-01-08 DIAGNOSIS — M9702XA Periprosthetic fracture around internal prosthetic left hip joint, initial encounter: Secondary | ICD-10-CM | POA: Diagnosis not present

## 2021-01-08 DIAGNOSIS — S72002G Fracture of unspecified part of neck of left femur, subsequent encounter for closed fracture with delayed healing: Secondary | ICD-10-CM | POA: Diagnosis not present

## 2021-01-08 DIAGNOSIS — I484 Atypical atrial flutter: Secondary | ICD-10-CM | POA: Diagnosis not present

## 2021-01-08 DIAGNOSIS — R278 Other lack of coordination: Secondary | ICD-10-CM | POA: Diagnosis not present

## 2021-01-08 DIAGNOSIS — L97522 Non-pressure chronic ulcer of other part of left foot with fat layer exposed: Secondary | ICD-10-CM | POA: Diagnosis not present

## 2021-01-08 DIAGNOSIS — L97322 Non-pressure chronic ulcer of left ankle with fat layer exposed: Secondary | ICD-10-CM | POA: Diagnosis not present

## 2021-01-08 DIAGNOSIS — Z87440 Personal history of urinary (tract) infections: Secondary | ICD-10-CM | POA: Diagnosis not present

## 2021-01-08 DIAGNOSIS — M25552 Pain in left hip: Secondary | ICD-10-CM | POA: Diagnosis not present

## 2021-01-08 DIAGNOSIS — G4733 Obstructive sleep apnea (adult) (pediatric): Secondary | ICD-10-CM | POA: Diagnosis not present

## 2021-01-08 DIAGNOSIS — R0902 Hypoxemia: Secondary | ICD-10-CM | POA: Diagnosis not present

## 2021-01-08 DIAGNOSIS — R41841 Cognitive communication deficit: Secondary | ICD-10-CM | POA: Diagnosis not present

## 2021-01-08 DIAGNOSIS — F419 Anxiety disorder, unspecified: Secondary | ICD-10-CM | POA: Diagnosis not present

## 2021-01-08 DIAGNOSIS — R109 Unspecified abdominal pain: Secondary | ICD-10-CM | POA: Diagnosis not present

## 2021-01-08 DIAGNOSIS — Z794 Long term (current) use of insulin: Secondary | ICD-10-CM | POA: Diagnosis not present

## 2021-01-08 DIAGNOSIS — R6520 Severe sepsis without septic shock: Secondary | ICD-10-CM | POA: Diagnosis not present

## 2021-01-08 DIAGNOSIS — Z9889 Other specified postprocedural states: Secondary | ICD-10-CM | POA: Diagnosis not present

## 2021-01-08 DIAGNOSIS — N400 Enlarged prostate without lower urinary tract symptoms: Secondary | ICD-10-CM | POA: Diagnosis not present

## 2021-01-08 DIAGNOSIS — F411 Generalized anxiety disorder: Secondary | ICD-10-CM | POA: Diagnosis not present

## 2021-01-08 DIAGNOSIS — S0990XA Unspecified injury of head, initial encounter: Secondary | ICD-10-CM | POA: Diagnosis not present

## 2021-01-08 DIAGNOSIS — I700 Atherosclerosis of aorta: Secondary | ICD-10-CM | POA: Diagnosis not present

## 2021-01-08 DIAGNOSIS — G20 Parkinson's disease: Secondary | ICD-10-CM | POA: Diagnosis not present

## 2021-01-08 DIAGNOSIS — N401 Enlarged prostate with lower urinary tract symptoms: Secondary | ICD-10-CM | POA: Diagnosis not present

## 2021-01-08 DIAGNOSIS — S51801A Unspecified open wound of right forearm, initial encounter: Secondary | ICD-10-CM | POA: Diagnosis not present

## 2021-01-08 DIAGNOSIS — I6381 Other cerebral infarction due to occlusion or stenosis of small artery: Secondary | ICD-10-CM | POA: Diagnosis not present

## 2021-01-08 DIAGNOSIS — I10 Essential (primary) hypertension: Secondary | ICD-10-CM | POA: Diagnosis not present

## 2021-01-08 DIAGNOSIS — M1990 Unspecified osteoarthritis, unspecified site: Secondary | ICD-10-CM | POA: Diagnosis not present

## 2021-01-08 DIAGNOSIS — R531 Weakness: Secondary | ICD-10-CM | POA: Diagnosis not present

## 2021-01-08 DIAGNOSIS — R2681 Unsteadiness on feet: Secondary | ICD-10-CM | POA: Diagnosis not present

## 2021-01-08 DIAGNOSIS — I480 Paroxysmal atrial fibrillation: Secondary | ICD-10-CM | POA: Diagnosis not present

## 2021-01-08 DIAGNOSIS — Z7984 Long term (current) use of oral hypoglycemic drugs: Secondary | ICD-10-CM | POA: Diagnosis not present

## 2021-01-08 LAB — CBC
HCT: 28.8 % — ABNORMAL LOW (ref 39.0–52.0)
Hemoglobin: 9.1 g/dL — ABNORMAL LOW (ref 13.0–17.0)
MCH: 28.4 pg (ref 26.0–34.0)
MCHC: 31.6 g/dL (ref 30.0–36.0)
MCV: 90 fL (ref 80.0–100.0)
Platelets: 359 10*3/uL (ref 150–400)
RBC: 3.2 MIL/uL — ABNORMAL LOW (ref 4.22–5.81)
RDW: 13.7 % (ref 11.5–15.5)
WBC: 17.1 10*3/uL — ABNORMAL HIGH (ref 4.0–10.5)
nRBC: 0 % (ref 0.0–0.2)

## 2021-01-08 LAB — BASIC METABOLIC PANEL
Anion gap: 10 (ref 5–15)
BUN: 19 mg/dL (ref 8–23)
CO2: 25 mmol/L (ref 22–32)
Calcium: 7.9 mg/dL — ABNORMAL LOW (ref 8.9–10.3)
Chloride: 98 mmol/L (ref 98–111)
Creatinine, Ser: 0.9 mg/dL (ref 0.61–1.24)
GFR, Estimated: >60 mL/min (ref >60)
Glucose, Bld: 210 mg/dL — ABNORMAL HIGH (ref 70–99)
Potassium: 4.1 mmol/L (ref 3.5–5.1)
Sodium: 133 mmol/L — ABNORMAL LOW (ref 135–145)

## 2021-01-08 LAB — RESP PANEL BY RT-PCR (FLU A&B, COVID) ARPGX2
Influenza A by PCR: NEGATIVE
Influenza B by PCR: NEGATIVE
SARS Coronavirus 2 by RT PCR: NEGATIVE

## 2021-01-08 LAB — GLUCOSE, CAPILLARY
Glucose-Capillary: 212 mg/dL — ABNORMAL HIGH (ref 70–99)
Glucose-Capillary: 207 mg/dL — ABNORMAL HIGH (ref 70–99)
Glucose-Capillary: 239 mg/dL — ABNORMAL HIGH (ref 70–99)
Glucose-Capillary: 156 mg/dL — ABNORMAL HIGH (ref 70–99)

## 2021-01-08 LAB — MAGNESIUM: Magnesium: 2 mg/dL (ref 1.7–2.4)

## 2021-01-08 IMAGING — CT CT ADDITIONAL VIEWS AT NO CHARGE
2 of 7 series · 15 of 36 positions shown, 19 images · IV contrast (agent unspecified)
Comparison: Chest CT [DATE]

CLINICAL DATA: Ascending aortic dilatation.

EXAM:
CT ADDITIONAL VIEWS AT NO CHARGE
TECHNIQUE: Inadvertent chest CT without contrast was performed (a CT of the
abdomen and pelvis was intended and obtained).
CONTRAST:  None

[Series 5: thins · axial · 0.67mm/px · z∈[+1201,+1402]mm · 14 of 285 slices shown, 18 images]
[im 17/285  mediastinal]
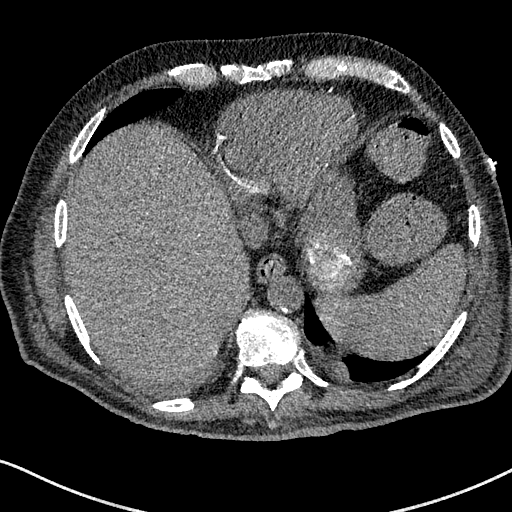
[im 17/285  lung]
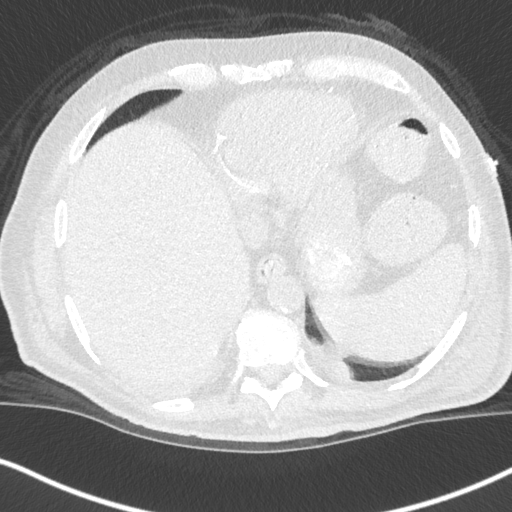
[im 34/285  lung]
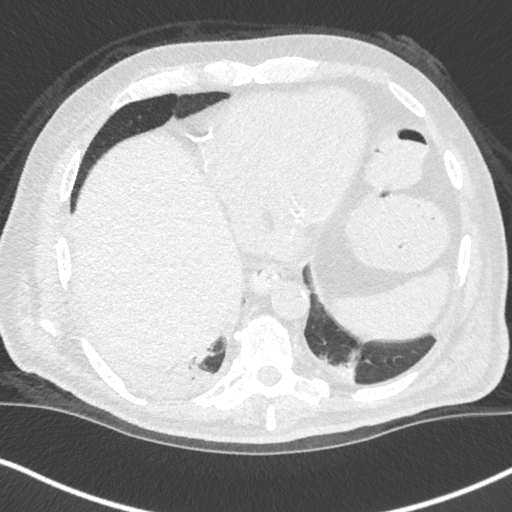
[im 51/285  lung]
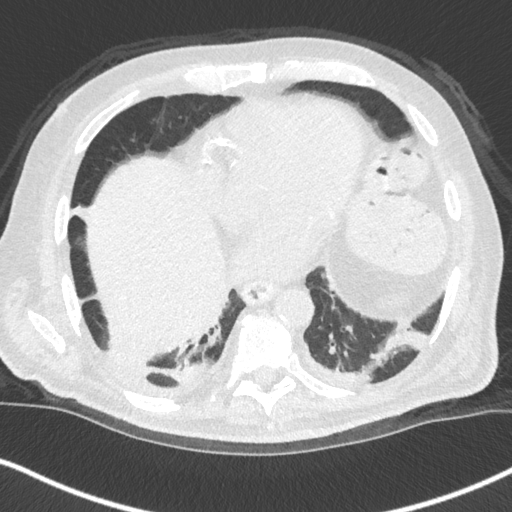
[im 84/285  lung]
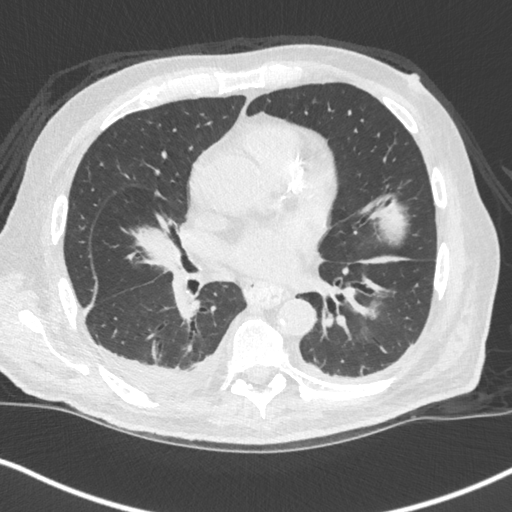
[im 101/285  mediastinal]
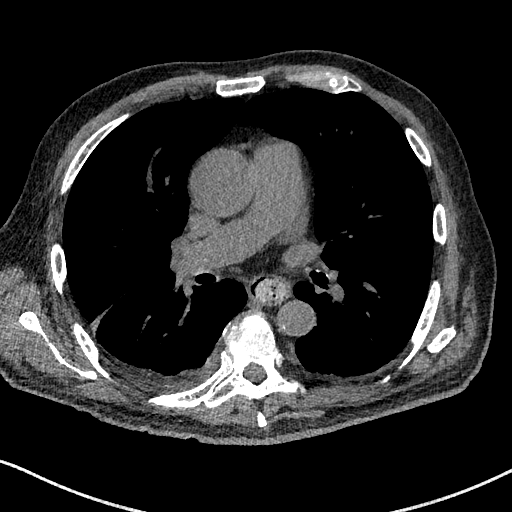
[im 101/285  lung]
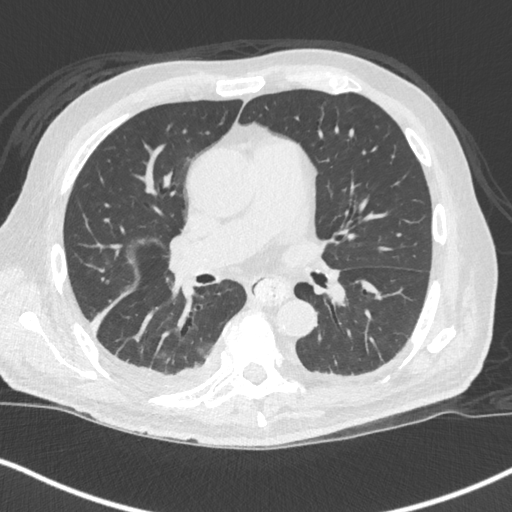
[im 117/285  lung]
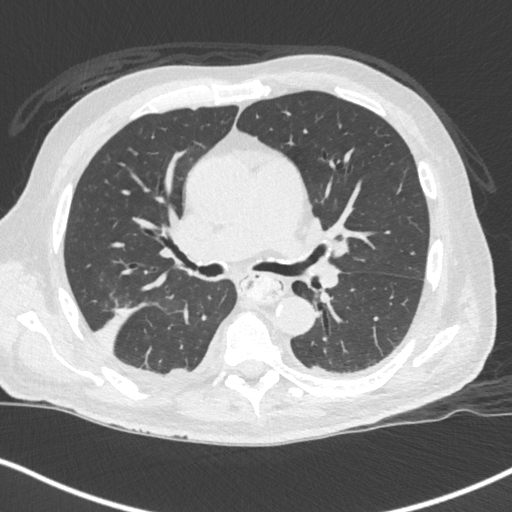
[im 134/285  lung]
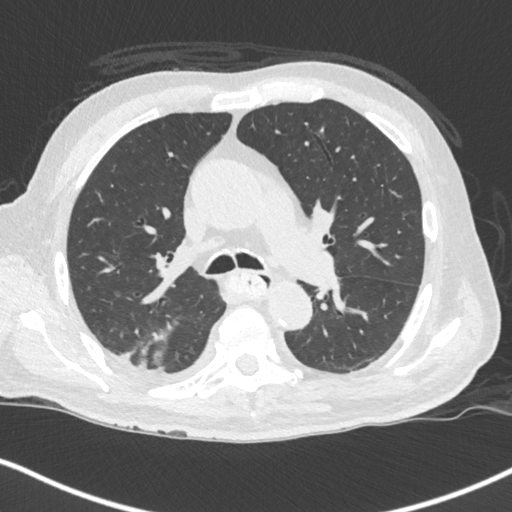
[im 151/285  lung]
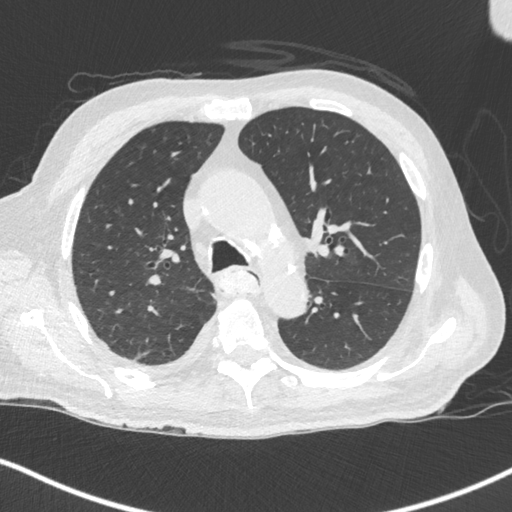
[im 168/285  mediastinal]
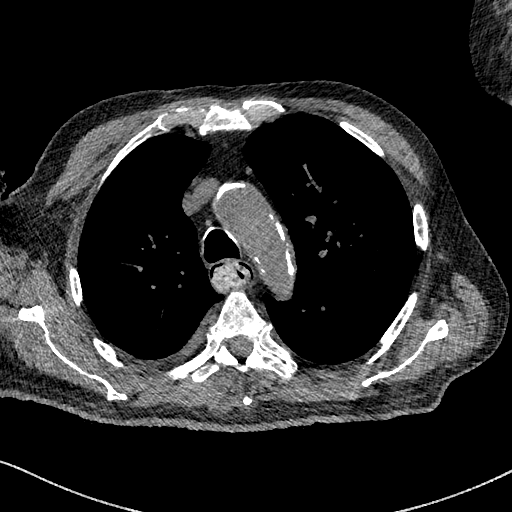
[im 168/285  lung]
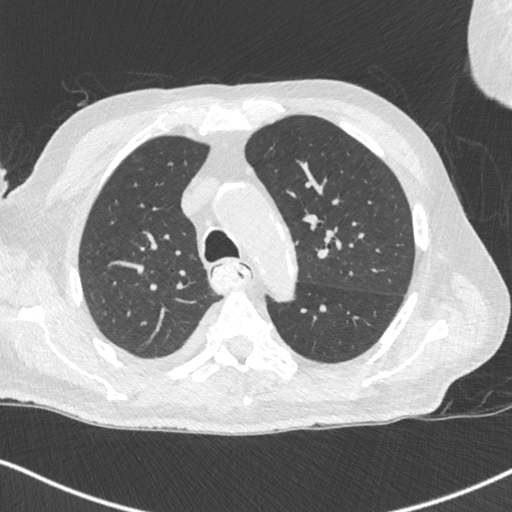
[im 184/285  lung]
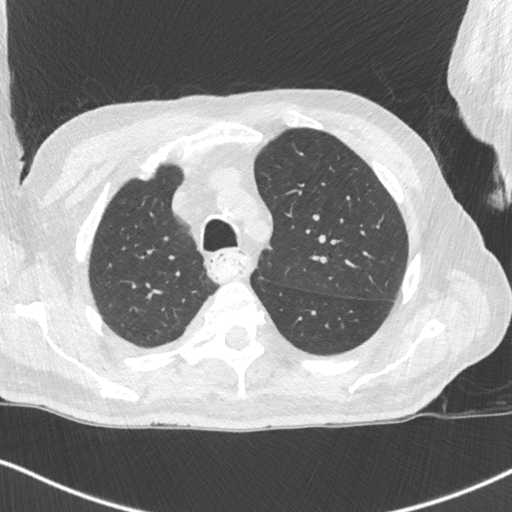
[im 218/285  lung]
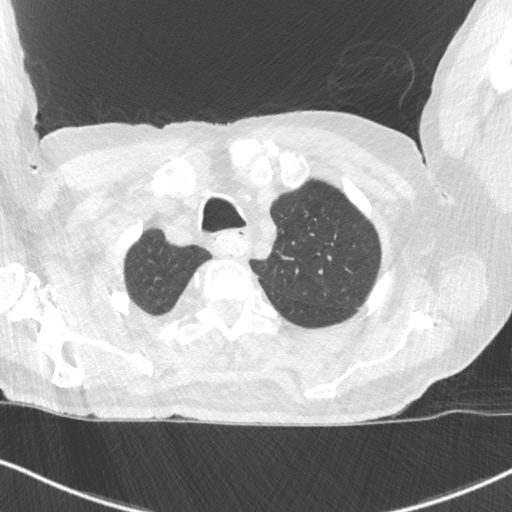
[im 234/285  lung]
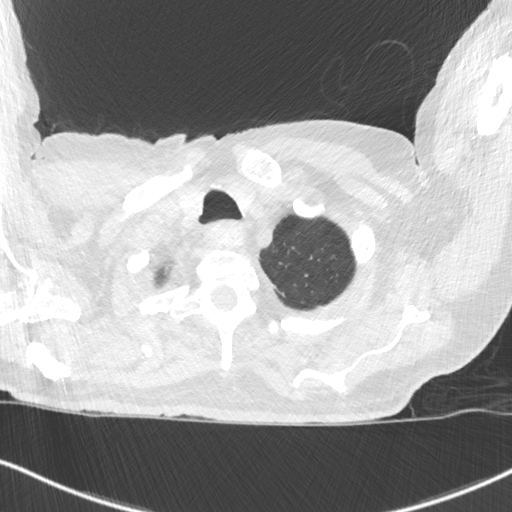
[im 251/285  mediastinal]
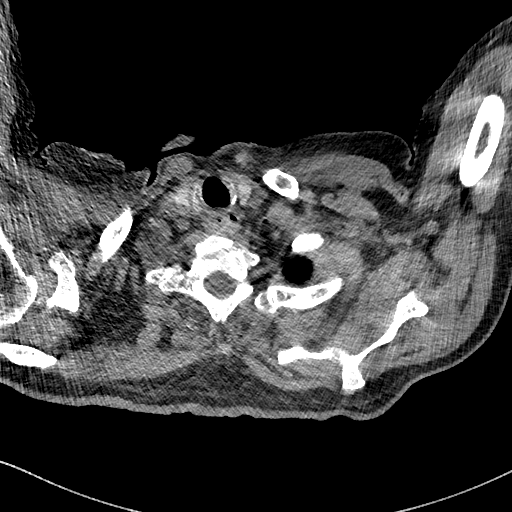
[im 251/285  lung]
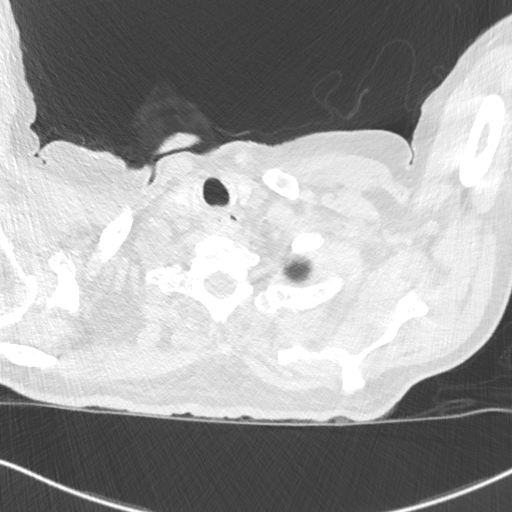
[im 268/285  lung]
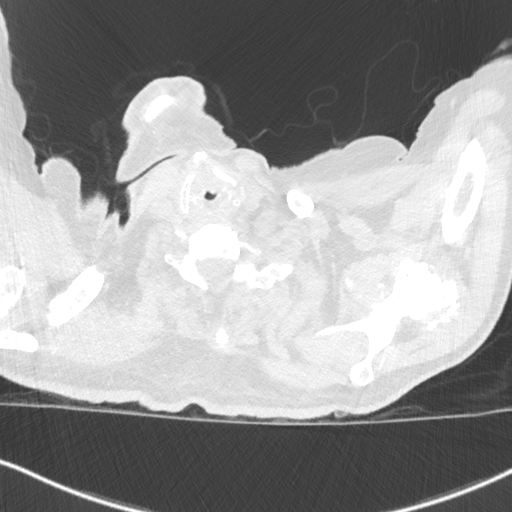

[Series 7: coronal · coronal · 0.47mm/px · 1 of 101 slices shown]
[im 51/101  lung]
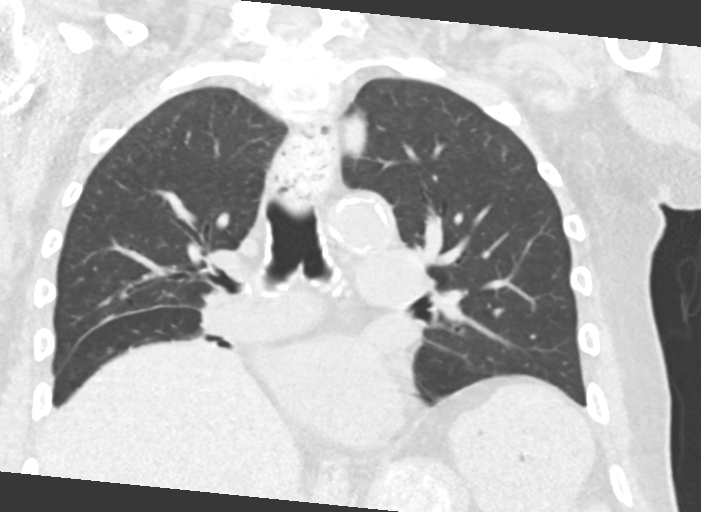

[15 of 36 positions shown; findings below may reference images not displayed]

FINDINGS: Normal heart size. No pericardial effusion. Aortic and coronary
atherosclerosis. Dilated ascending aorta which measures up to 4.3 cm
in diameter, unchanged from [KW].

Semi-solid material throughout the esophagus. No visible mass or
inflammation underlying.

Atelectasis at the lung bases.

Severe glenohumeral osteoarthritis, worse on the left.
IMPRESSION: 1. Reportedly inadvertent chest CT performed at time of abdominal
CT.
2. Atelectasis at the lung bases.
3. Food/material retained in the esophagus without underlying
inflammation or visible mass. Please correlate for chronic
dysphagia.
4. Dilated ascending aorta at 4.3 cm, unchanged from [KW]. Recommend
annual imaging followup by CTA or MRA. This recommendation follows
[KW] ACCF/AHA/AATS/ACR/ASA/SCA/TZUL/TZUL/TZUL/TZUL Guidelines for the
Diagnosis and Management of Patients with Thoracic Aortic Disease.
Circulation. [KW]; 121: [id number]. Aortic aneurysm NOS ([KW]-[KW])

## 2021-01-08 IMAGING — CT CT ABD-PELV W/O CM
2 of 4 series · 16 of 46 positions shown, 18 images · non-contrast
Comparison: Ultrasound [DATE].  CT [DATE].

CLINICAL DATA: Abdominal pain and fever.  Urinary tract infection.

EXAM:
CT ABDOMEN AND PELVIS WITHOUT CONTRAST
TECHNIQUE: Multidetector CT imaging of the abdomen and pelvis was performed
following the standard protocol without IV contrast.

[Series 11: abd/ pelvis 5.0 i30f 2 · axial · 0.82mm/px · z∈[-453,+52]mm · 13 of 111 slices shown, 15 images]
[im 5/111  soft-tissue]
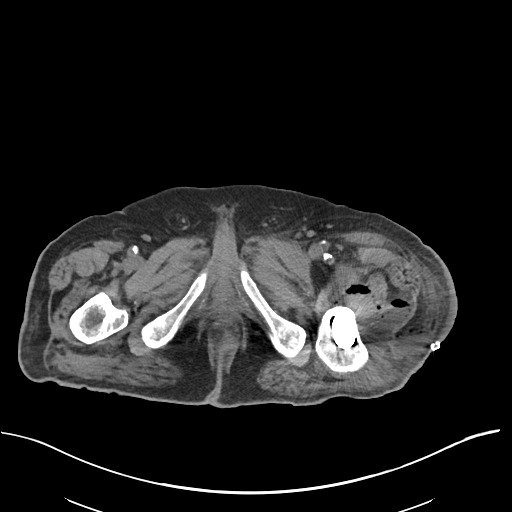
[im 5/111  bone]
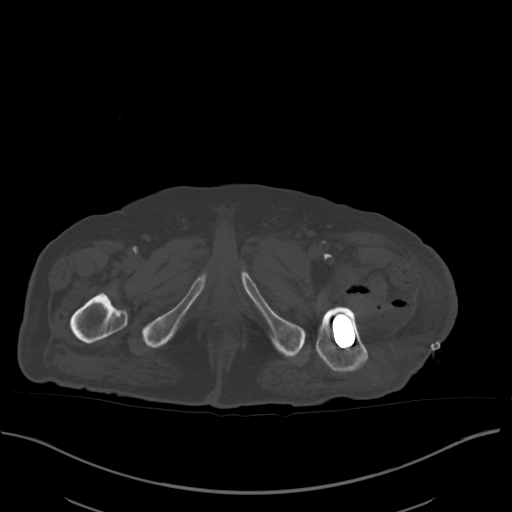
[im 14/111  soft-tissue]
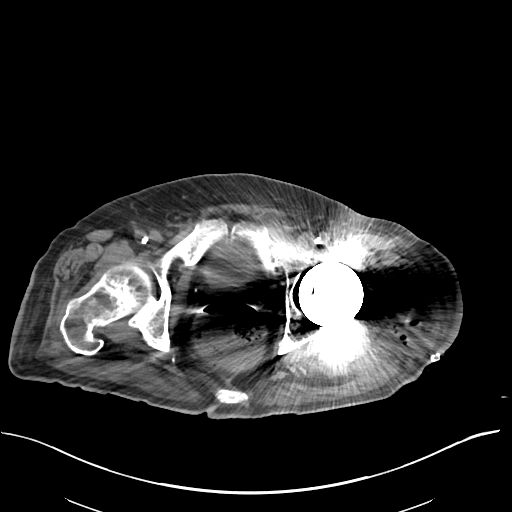
[im 23/111  soft-tissue]
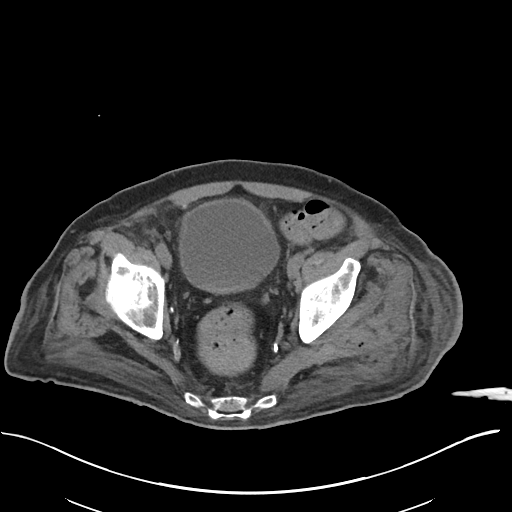
[im 31/111  soft-tissue]
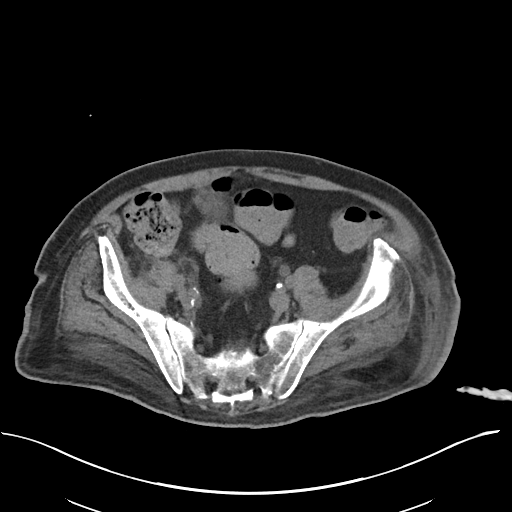
[im 40/111  soft-tissue]
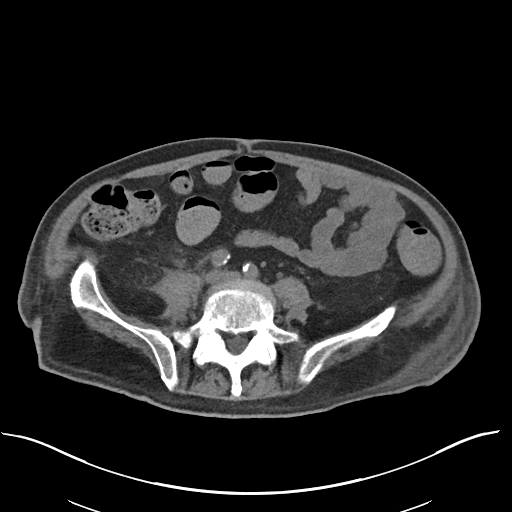
[im 49/111  soft-tissue]
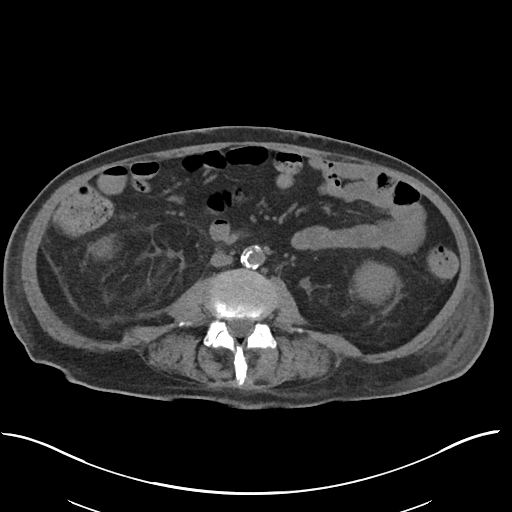
[im 58/111  soft-tissue]
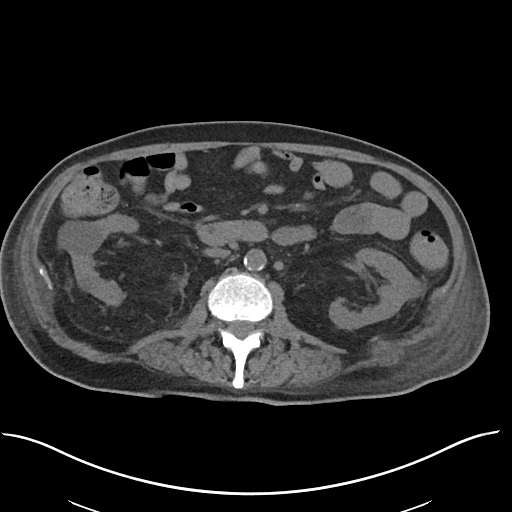
[im 62/111  soft-tissue]
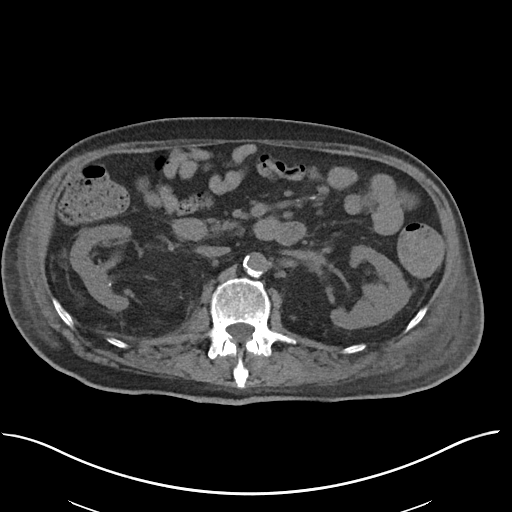
[im 71/111  soft-tissue]
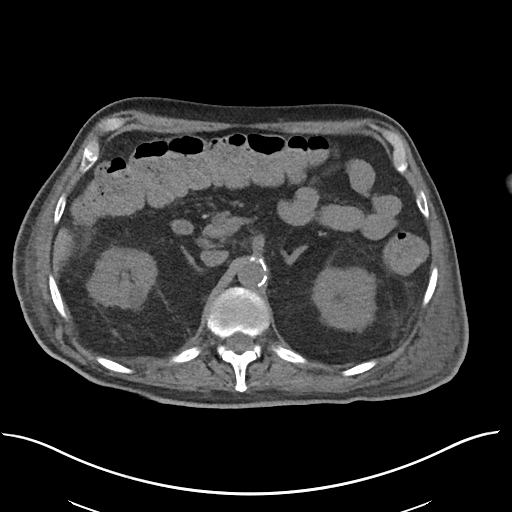
[im 71/111  bone]
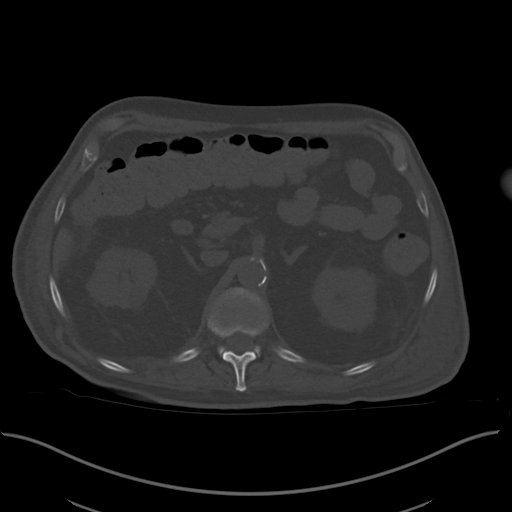
[im 80/111  soft-tissue]
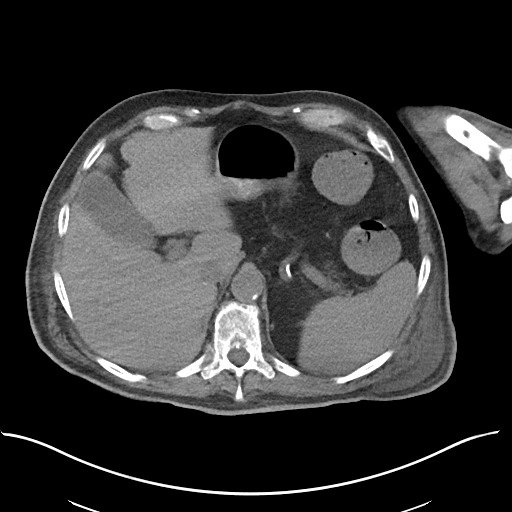
[im 89/111  soft-tissue]
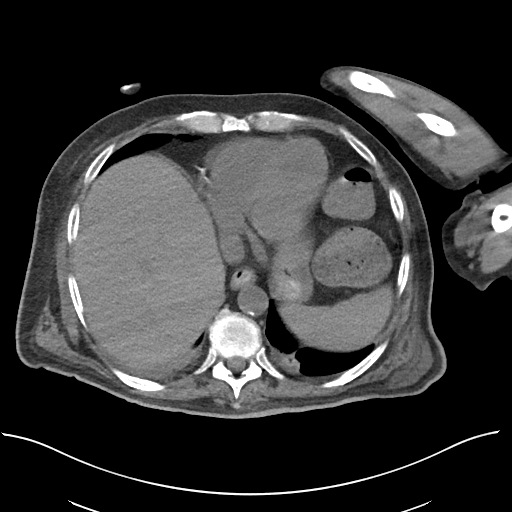
[im 97/111  soft-tissue]
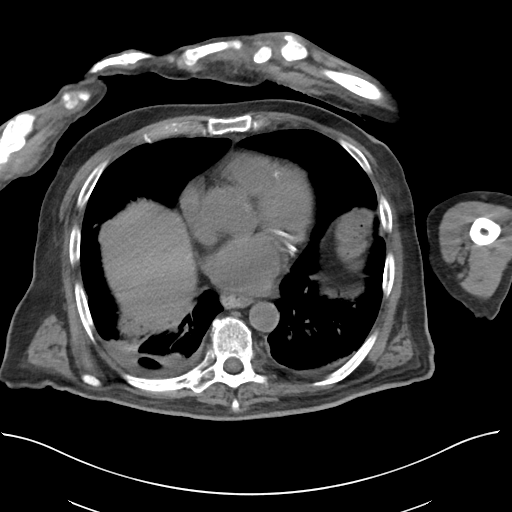
[im 106/111  soft-tissue]
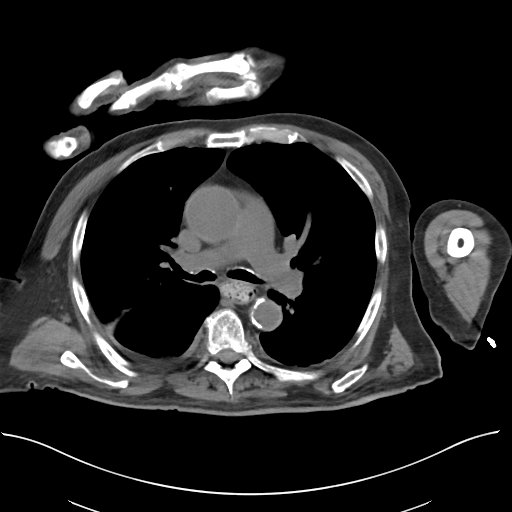

[Series 14: cor st · coronal · 0.79mm/px · 3 of 116 slices shown]
[im 39/116  soft-tissue]
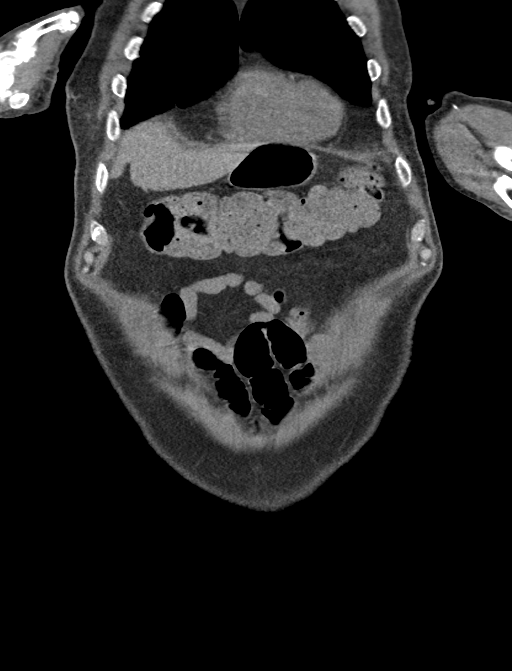
[im 52/116  soft-tissue]
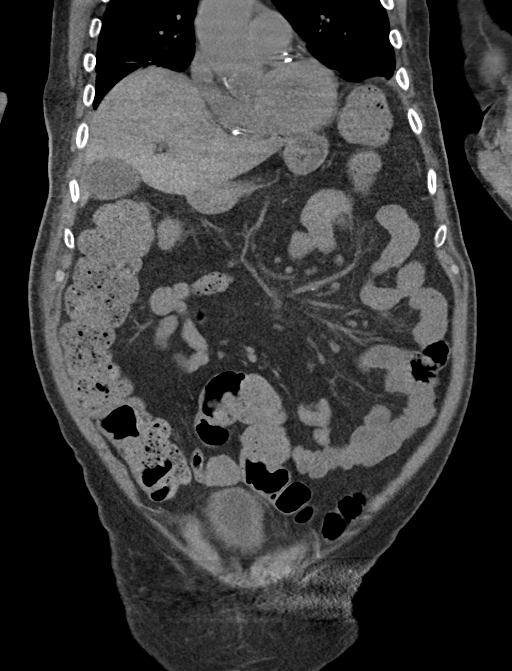
[im 64/116  soft-tissue]
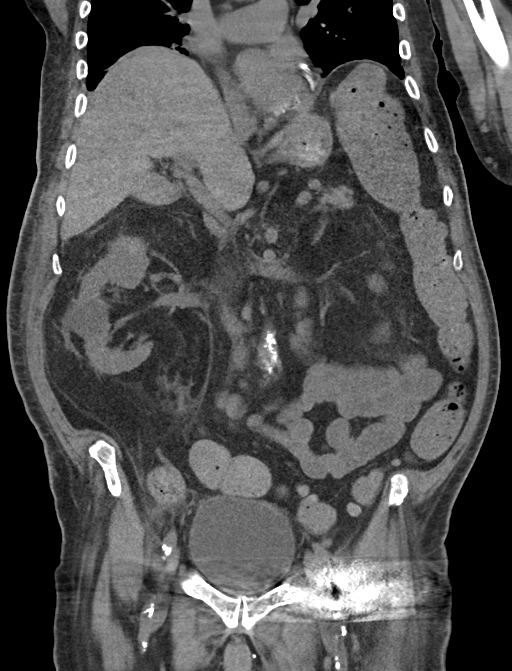

[16 of 46 positions shown; findings below may reference images not displayed]

FINDINGS: Lower chest: Small dependent pleural effusions with dependent
atelectasis, right more than left. Ingested material in the
esophagus, question reflux versus poor peristalsis.

Hepatobiliary: Liver parenchyma appears normal without contrast. No
calcified gallstones.

Pancreas: No significant pancreatic finding.

Spleen: Normal

Adrenals/Urinary Tract: Adrenal glands are normal. Small
nonobstructing stones in the right kidney as seen previously. Small
bilateral renal cysts as seen previously. No hydronephrosis or
passing stone. There is nonspecific retroperitoneal edema. No
evidence of a definable retroperitoneal hemorrhage or drainable
fluid collection. Bladder appears normal.

Stomach/Bowel: No evidence of bowel obstruction. No acute
inflammatory bowel process is identified. Moderate amount of stool
in the left colon.

Vascular/Lymphatic: Aortic atherosclerosis. No aneurysm. IVC is
normal. No adenopathy.

Reproductive: Normal

Other: No intraperitoneal fluid or air.

Musculoskeletal: Interval hip replacement on the left. Fluid in air
in the soft tissues along the surgical approach. Given the recent
surgery, this is indeterminate for expected postoperative change
versus postoperative infection.
IMPRESSION: Small pleural effusions layering dependently with dependent
atelectasis, right more than left.

Nonobstructing calculi in the right kidney as seen previously. No
hydronephrosis.

Increase in retroperitoneal edema in a nonspecific fashion when
compared to the previous exam. This may be due to the recent surgery
and recumbent positioning.

Interval left hip replacement. Fluid and air in the soft tissues in
the region of the surgical approach is indeterminate for expected
postoperative change versus postoperative infection.

## 2021-01-08 MED ORDER — HYDROCODONE-ACETAMINOPHEN 5-325 MG PO TABS: 1.0000 | ORAL_TABLET | ORAL | 0 refills | Status: DC | PRN

## 2021-01-08 MED ORDER — OLANZAPINE 2.5 MG PO TABS: 2.5000 mg | ORAL_TABLET | Freq: Two times a day (BID) | ORAL | Status: DC | PRN

## 2021-01-08 MED ORDER — TRAMADOL HCL 50 MG PO TABS: 75.0000 mg | ORAL_TABLET | Freq: Two times a day (BID) | ORAL | 0 refills | Status: DC

## 2021-01-08 MED ORDER — CLONAZEPAM 0.5 MG PO TABS
0.5000 mg | ORAL_TABLET | Freq: Every day | ORAL | 0 refills | Status: DC
Start: 2021-01-08 — End: 2021-02-01

## 2021-01-08 MED ORDER — OLANZAPINE 2.5 MG PO TABS: 2.5000 mg | ORAL_TABLET | Freq: Every day | ORAL | Status: DC

## 2021-01-08 MED ORDER — FOSFOMYCIN TROMETHAMINE 3 G PO PACK
3.0000 g | PACK | Freq: Once | ORAL | Status: AC
  Administered 2021-01-08: 3 g via ORAL
  Filled 2021-01-08 (×2): qty 3

## 2021-01-08 MED ORDER — METHOCARBAMOL 500 MG PO TABS: 500.0000 mg | ORAL_TABLET | Freq: Four times a day (QID) | ORAL | 0 refills | Status: AC | PRN

## 2021-01-08 MED ORDER — INSULIN GLARGINE-YFGN 100 UNIT/ML ~~LOC~~ SOLN
15.0000 [IU] | Freq: Two times a day (BID) | SUBCUTANEOUS | Status: DC
  Administered 2021-01-08: 15 [IU] via SUBCUTANEOUS
  Filled 2021-01-08 (×2): qty 0.15

## 2021-01-08 MED ORDER — CLONAZEPAM 0.25 MG PO TBDP: 0.2500 mg | ORAL_TABLET | Freq: Every morning | ORAL | 0 refills | Status: DC

## 2021-01-08 NOTE — Discharge Summary (Signed)
Physician Discharge Summary  Jason Hartman OQH:[id number] DOB: [birth year] DOA: 12/29/2020  PCP: [name], MD  Admit date: 12/29/2020 Discharge date: 01/08/2021  Admitted From: Home Disposition:  SNF  Recommendations for Outpatient Follow-up:  Follow up with PCP in 1-2 weeks Please obtain BMP/CBC in one week your next doctors visit.  Klonopin, Olanzapine added. Follow up outpatient Psych Discontinue Elavil and citalopram Pain medication by bowel regimen prescribed   Discharge Condition: Stable CODE STATUS: Full Diet recommendation: 2g Na   Brief/Interim Summary: 85 year old with history of HTN, OSA, RLS and DM2 presented with fall from SNF.  He has been at [facility] for deconditioning after severe UTI.  He fell after attempting to transfer from wheelchair to bed landed on his hip and found to have left hip fracture.  Patient was quite delirious in the hospital therefore UA was checked which was positive for UTI but there was also concerns for underlying delirium therefore psychiatry team was consulted and medication adjustments were made.     Assessment & Plan:   Principal Problem:   Hip fracture ([place]) Active Problems:   Type II diabetes mellitus ([place])   OBSTRUCTIVE SLEEP APNEA   Essential hypertension   Atherosclerotic heart disease of native coronary artery without angina pectoris   Anxiety   PAF (paroxysmal atrial fibrillation) (HCC)     Delirium  ? Mild cognitive impairment - Delirium precautions.  Psychiatry evaluated the patient, medication adjustment as stated above.   Urinary tract infection - Although it says cultures were sent in EMR they were not there from received empiric treatment for ESBL with 5 days of IV meropenem.  Patient also has history of Enterococcus therefore received 1 dose of 3 g of oral fosfomycin.   Left hip fracture status post left hemiarthroplasty 9/24 -Pain control, PT/OT-recommended SNF arrangements made. - Continue home Eliquis.   Weightbearing as tolerated.  Leukocytosis - Likely from infection and mild dehydration, should improve.  Repeat lab work in about 1 week.  CT abdomen pelvis without contrast performed on the day of discharge shows nonobstructive renal stone, postoperative changes and mild pleural effusion.  Patient does not have any respiratory distress.   Hyponatremia -Gentle hydration as needed   Right testicular pain due to multiseptated cyst; 5cm.  -Ultrasound-unremarkable.  Previous hospitalist spoke with urology who recommends outpatient follow-up at [facility]. Follow up US in 3 months.    CAD status post PCI -Currently chest pain-free   Chronic atrial fibrillation -On amiodarone and Lopressor   Mood disorder - Currently patient is on Klonopin and Zyprexa.  Discontinue Elavil and citalopram by psychiatry   HTN -Lopressor   Diabetes mellitus type 2, insulin-dependent Peripheral neuropathy -A1c 7.2 - Resume home regimen.  Continue Neurontin   OSA -Continue CPAP    Body mass index is 21.52 kg/m.         Discharge Diagnoses:  Principal Problem:   Hip fracture ([place]) Active Problems:   Type II diabetes mellitus ([place])   OBSTRUCTIVE SLEEP APNEA   Essential hypertension   Atherosclerotic heart disease of native coronary artery without angina pectoris   Anxiety   PAF (paroxysmal atrial fibrillation) [facility])      Consultations: Orthopedic Psychiatry  Subjective: Feels okay no complaints.  Off-and-on show signs of mild delirium  Discharge Exam: Vitals:   01/07/21 2018 01/08/21 0751  BP: 132/63 (!) 158/72  Pulse: 92 88  Resp: 19 17  Temp: 98.2 F (36.8 C) 98.4 F (36.9 C)  SpO2: 98% 98%  Vitals:   01/06/21 2250 01/07/21 0820 01/07/21 2018 01/08/21 0751  BP: (!) 142/64 139/66 132/63 (!) 158/72  Pulse: 86 92 92 88  Resp:   19 17  Temp: 99.6 F (37.6 C) 99.1 F (37.3 C) 98.2 F (36.8 C) 98.4 F (36.9 C)  TempSrc: Oral Oral Oral Axillary  SpO2: 99% 98% 98% 98%   Weight:      Height:        General: Pt is alert, awake, not in acute distress Cardiovascular: RRR, S1/S2 +, no rubs, no gallops Respiratory: CTA bilaterally, no wheezing, no rhonchi Abdominal: Soft, NT, ND, bowel sounds + Extremities: no edema, no cyanosis  Discharge Instructions   Allergies as of 01/08/2021       Reactions   Oxycodone Anaphylaxis   Iodinated Diagnostic Agents Hives   alleric to renografin,isovue & omnipaque, hives, requires 13 hr prep//a.calhoun, Onset Date: [id number]   Iodine Hives, Rash   alleric to renografin,isovue & omnipaque, hives, requires 13 hr prep//a.calhoun, Onset Date: [id number] allergic to renografin,isovue & omnipaque, hives, requires 13 hr prep//a.calhoun, Onset Date: [id number]   Linezolid Hives, Rash      Methadone Hcl Other (See Comments)   hallucinations   Promethazine Other (See Comments)   Delirium (pulled out IV), erratic behavior Mental status change   Isovue [iopamidol] Hives   Morphine Sulfate Other (See Comments)   Unknown reaction   Omnipaque [iohexol] Hives   Code: HIVES, Desc: alleric to renografin,isovue & omnipaque, hives, requires 13 hr prep//a.calhoun, Onset Date: [id number]   Quetiapine Other (See Comments)   Unknown reaction   Renografin [diatrizoate] Hives   Statins Other (See Comments)   Muscle weakness   Zolpidem Other (See Comments)   Erratic behavior/delusions Altered mental status   Atorvastatin Itching, Other (See Comments)   Tired, weakness   Carbidopa-levodopa Anxiety   Chlorhexidine Gluconate [chlorhexidine] Hives, Rash   Clindamycin Rash   Colesevelam Other (See Comments)   tired   Doxazosin Rash   Lovastatin Other (See Comments)   Tired, nervousness   Rosuvastatin Other (See Comments)   Tired, weakness        Medication List     STOP taking these medications    amitriptyline 10 MG tablet Commonly known as: ELAVIL   citalopram 10 MG tablet Commonly known as: CELEXA   citalopram 20 MG  tablet Commonly known as: CELEXA       TAKE these medications    acetaminophen 500 MG tablet Commonly known as: TYLENOL Take 1,000 mg by mouth every 8 (eight) hours.   Acidophilus Lactobacillus Caps Take 1 capsule by mouth every evening.   amiodarone 200 MG tablet Commonly known as: PACERONE Take 1 tablet (200 mg total) by mouth daily.   apixaban 5 MG Tabs tablet Commonly known as: ELIQUIS Take 1 tablet (5 mg total) by mouth 2 (two) times daily.   clonazePAM 0.5 MG tablet Commonly known as: KLONOPIN Take 1 tablet (0.5 mg total) by mouth at bedtime. What changed: when to take this   clonazePAM 0.25 MG disintegrating tablet Commonly known as: KLONOPIN Take 1 tablet (0.25 mg total) by mouth every morning. Start taking on: January 09, 2021 What changed: You were already taking a medication with the same name, and this prescription was added. Make sure you understand how and when to take each.   famotidine 20 MG tablet Commonly known as: PEPCID Take 20 mg by mouth every morning.   ferrous sulfate 325 (65 FE) MG EC tablet Take 325 mg  by mouth every morning.   gabapentin 300 MG capsule Commonly known as: NEURONTIN Take 1 capsule (300 mg total) by mouth at bedtime.   HYDROcodone-acetaminophen 5-325 MG tablet Commonly known as: NORCO/VICODIN Take 1-2 tablets by mouth every 4 (four) hours as needed for severe pain.   Lantus SoloStar 100 UNIT/ML Solostar Pen Generic drug: insulin glargine Inject 8 Units into the skin 2 (two) times daily.   melatonin 5 MG Tabs Take 5 mg by mouth at bedtime.   metFORMIN 500 MG 24 hr tablet Commonly known as: GLUCOPHAGE-XR Take 500 mg by mouth daily with breakfast.   methocarbamol 500 MG tablet Commonly known as: ROBAXIN Take 1 tablet (500 mg total) by mouth every 6 (six) hours as needed for muscle spasms.   metoprolol tartrate 25 MG tablet Commonly known as: LOPRESSOR Take 0.5 tablets (12.5 mg total) by mouth 2 (two) times daily.    Neupro 8 MG/24HR Pt24 Generic drug: Rotigotine Place 1 patch onto the skin at bedtime. For Parkinson's   Nyamyc powder Generic drug: nystatin Apply 1 application topically in the morning and at bedtime. To rash on back and groin and sacrum   OLANZapine 2.5 MG tablet Commonly known as: ZYPREXA Take 1 tablet (2.5 mg total) by mouth at bedtime.   OLANZapine 2.5 MG tablet Commonly known as: ZYPREXA Take 1 tablet (2.5 mg total) by mouth 2 (two) times daily as needed (agitation threatening pt or staff safety, hallucinations).   Ozempic (0.25 or 0.5 MG/DOSE) 2 MG/1.5ML Sopn Generic drug: Semaglutide(0.25 or 0.5MG /DOS) Inject 0.5 mg into the skin every Tuesday.   polyethylene glycol powder 17 GM/SCOOP powder Commonly known as: GLYCOLAX/MIRALAX Take 17 g by mouth every morning. Mix in 8 oz water and drink   senna-docusate 8.6-50 MG tablet Commonly known as: Senokot-S Take 1 tablet by mouth 2 (two) times daily as needed for moderate constipation.   thiamine 100 MG tablet Take 1 tablet (100 mg total) by mouth daily.   traMADol 50 MG tablet Commonly known as: ULTRAM Take 1.5 tablets (75 mg total) by mouth 2 (two) times daily.        Follow-up Information     Vanetta Mulders, MD Follow up in 2 week(s).   Specialty: Orthopedic Surgery Contact information: [address] [place] [address] [phone]                Allergies  Allergen Reactions   Oxycodone Anaphylaxis   Iodinated Diagnostic Agents Hives    alleric to renografin,isovue & omnipaque, hives, requires 13 hr prep//a.calhoun, Onset Date: [id number]      Iodine Hives and Rash    alleric to renografin,isovue & omnipaque, hives, requires 13 hr prep//a.calhoun, Onset Date: [id number] allergic to renografin,isovue & omnipaque, hives, requires 13 hr prep//a.calhoun, Onset Date: [id number]   Linezolid Hives and Rash        Methadone Hcl Other (See Comments)    hallucinations    Promethazine  Other (See Comments)    Delirium (pulled out IV), erratic behavior Mental status change     Isovue [Iopamidol] Hives   Morphine Sulfate Other (See Comments)    Unknown reaction   Omnipaque [Iohexol] Hives    Code: HIVES, Desc: alleric to renografin,isovue & omnipaque, hives, requires 13 hr prep//a.calhoun, Onset Date: [id number]    Quetiapine Other (See Comments)    Unknown reaction   Renografin [Diatrizoate] Hives   Statins Other (See Comments)    Muscle weakness   Zolpidem Other (See Comments)  Erratic behavior/delusions Altered mental status   Atorvastatin Itching and Other (See Comments)    Tired, weakness    Carbidopa-Levodopa Anxiety   Chlorhexidine Gluconate [Chlorhexidine] Hives and Rash   Clindamycin Rash   Colesevelam Other (See Comments)    tired    Doxazosin Rash   Lovastatin Other (See Comments)    Tired, nervousness    Rosuvastatin Other (See Comments)    Tired, weakness     You were cared for by a hospitalist during your hospital stay. If you have any questions about your discharge medications or the care you received while you were in the hospital after you are discharged, you can call the unit and asked to speak with the hospitalist on call if the hospitalist that took care of you is not available. Once you are discharged, your primary care physician will handle any further medical issues. Please note that no refills for any discharge medications will be authorized once you are discharged, as it is imperative that you return to your primary care physician (or establish a relationship with a primary care physician if you do not have one) for your aftercare needs so that they can reassess your need for medications and monitor your lab values.   Procedures/Studies: CT ABDOMEN PELVIS WO CONTRAST  Result Date: 01/08/2021 CLINICAL DATA:  Abdominal pain and fever.  Urinary tract infection. EXAM: CT ABDOMEN AND PELVIS WITHOUT CONTRAST TECHNIQUE: Multidetector CT  imaging of the abdomen and pelvis was performed following the standard protocol without IV contrast. COMPARISON:  Ultrasound 01/01/2021.  CT 10/25/2020. FINDINGS: Lower chest: Small dependent pleural effusions with dependent atelectasis, right more than left. Ingested material in the esophagus, question reflux versus poor peristalsis. Hepatobiliary: Liver parenchyma appears normal without contrast. No calcified gallstones. Pancreas: No significant pancreatic finding. Spleen: Normal Adrenals/Urinary Tract: Adrenal glands are normal. Small nonobstructing stones in the right kidney as seen previously. Small bilateral renal cysts as seen previously. No hydronephrosis or passing stone. There is nonspecific retroperitoneal edema. No evidence of a definable retroperitoneal hemorrhage or drainable fluid collection. Bladder appears normal. Stomach/Bowel: No evidence of bowel obstruction. No acute inflammatory bowel process is identified. Moderate amount of stool in the left colon. Vascular/Lymphatic: Aortic atherosclerosis. No aneurysm. IVC is normal. No adenopathy. Reproductive: Normal Other: No intraperitoneal fluid or air. Musculoskeletal: Interval hip replacement on the left. Fluid in air in the soft tissues along the surgical approach. Given the recent surgery, this is indeterminate for expected postoperative change versus postoperative infection. IMPRESSION: Small pleural effusions layering dependently with dependent atelectasis, right more than left. Nonobstructing calculi in the right kidney as seen previously. No hydronephrosis. Increase in retroperitoneal edema in a nonspecific fashion when compared to the previous exam. This may be due to the recent surgery and recumbent positioning. Interval left hip replacement. Fluid and air in the soft tissues in the region of the surgical approach is indeterminate for expected postoperative change versus postoperative infection. Electronically Signed   By: Nelson Chimes M.D.    On: 01/08/2021 11:21   DG Pelvis 1-2 Views  Result Date: 12/30/2020 CLINICAL DATA:  Postop left hip arthroplasty. EXAM: PELVIS - 1 VIEW COMPARISON:  12/29/2020 FINDINGS: Single AP pelvis radiograph demonstrates a left hip hemiarthroplasty that appears well seated and aligned. No acute fracture or evidence of an operative complication. IMPRESSION: Well-positioned left hip hemiarthroplasty. Electronically Signed   By: [name] M.D.   On: 12/30/2020 13:06   DG Pelvis 1-2 Views  Result Date: 12/29/2020 CLINICAL DATA:  Left hip pain after fall. EXAM: PELVIS - 1-2 VIEW COMPARISON:  January 11, 2015. FINDINGS: There is no evidence of pelvic fracture or diastasis. No pelvic bone lesions are seen. IMPRESSION: Negative. Electronically Signed   By: Marijo Conception M.D.   On: 12/29/2020 14:17   DG Elbow Complete Left  Result Date: 12/29/2020 CLINICAL DATA:  Left elbow pain after fall. EXAM: LEFT ELBOW - COMPLETE 3+ VIEW COMPARISON:  None. FINDINGS: There is no evidence of fracture, dislocation, or joint effusion. There is no evidence of arthropathy or other focal bone abnormality. Soft tissues are unremarkable. IMPRESSION: Negative. Electronically Signed   By: Marijo Conception M.D.   On: 12/29/2020 14:15   US RENAL  Result Date: 01/01/2021 CLINICAL DATA:  Left-sided abdominal pain EXAM: RENAL / URINARY TRACT ULTRASOUND COMPLETE COMPARISON:  10/25/2020 FINDINGS: Right Kidney: Renal measurements: 12.0 x 6.6 x 6.5 cm = volume: 270.6 mL. Normal renal cortical echotexture. Multiple simple appearing cysts are identified largest measuring 3.3 cm. No hydronephrosis or nephrolithiasis. Left Kidney: Renal measurements: 11.8 x 7.5 x 7.0 cm = volume: 322.4 mL. Echogenicity within normal limits. No mass or hydronephrosis visualized. Bladder: Not well visualized. Other: Examination was terminated prematurely at the patient's request due to pain. IMPRESSION: 1. Simple right renal cysts. Otherwise unremarkable renal  ultrasound. Electronically Signed   By: Randa Ngo M.D.   On: 01/01/2021 22:04   DG Hand 2 View Left  Result Date: 12/29/2020 CLINICAL DATA:  Left hand pain after fall. EXAM: LEFT HAND - 2 VIEW COMPARISON:  None. FINDINGS: There is no evidence of fracture or dislocation. Severe degenerative changes seen involving the first carpometacarpal joint. Soft tissues are unremarkable. IMPRESSION: Severe osteoarthritis involving the first carpometacarpal joint. No acute abnormality seen. Electronically Signed   By: Marijo Conception M.D.   On: 12/29/2020 14:13   CT Hip Left Wo Contrast  Result Date: 12/29/2020 CLINICAL DATA:  Left hip pain after fall. EXAM: CT OF THE LEFT HIP WITHOUT CONTRAST TECHNIQUE: Multidetector CT imaging of the left hip was performed according to the standard protocol. Multiplanar CT image reconstructions were also generated. COMPARISON:  Pelvis and left femur x-rays from same day. FINDINGS: Bones/Joint/Cartilage Acute mildly impacted subcapital left femoral neck fracture. No dislocation. Joint spaces are preserved. No joint effusion. Ligaments Ligaments are suboptimally evaluated by CT. Muscles and Tendons Grossly intact. Soft tissue No fluid collection or hematoma.  No soft tissue mass. IMPRESSION: 1. Acute mildly impacted subcapital left femoral neck fracture. Electronically Signed   By: Titus Dubin M.D.   On: 12/29/2020 15:29   DG Shoulder Left  Result Date: 12/29/2020 CLINICAL DATA:  Left shoulder pain after fall. EXAM: LEFT SHOULDER - 2+ VIEW COMPARISON:  None. FINDINGS: There is no evidence of fracture or dislocation. Moderate degenerative changes seen involving the left glenohumeral and acromioclavicular joints. Soft tissues are unremarkable. IMPRESSION: Moderate degenerative joint disease is seen involving the left glenohumeral and acromioclavicular joints. No acute abnormality is noted. Electronically Signed   By: Marijo Conception M.D.   On: 12/29/2020 14:16   US SCROTUM  W/DOPPLER  Result Date: 12/31/2020 CLINICAL DATA:  Scrotal pain. EXAM: SCROTAL ULTRASOUND DOPPLER ULTRASOUND OF THE TESTICLES TECHNIQUE: Complete ultrasound examination of the testicles, epididymis, and other scrotal structures was performed. Color and spectral Doppler ultrasound were also utilized to evaluate blood flow to the testicles. COMPARISON:  None. FINDINGS: Right testicle Measurements: 3.1 x 2.3 x 1.6 cm. Multi-septated cystic area is noted in inferior portion  of right testicle which measures 5.0 x 1.3 x 1.2 cm. This most likely represents benign abnormality such as tubular ectasia of rete testes, but follow-up ultrasound is recommended to ensure stability. Left testicle Measurements: 0.6 x 2.2 x 1.4 cm. No mass or microlithiasis visualized. Right epididymis:  Normal in size and appearance. Left epididymis:  Normal in size and appearance. Hydrocele:  Small right hydrocele is noted. Varicocele:  None visualized. Pulsed Doppler interrogation of both testes demonstrates normal low resistance arterial and venous waveforms bilaterally. IMPRESSION: No evidence of testicular torsion. 5 cm multi-septated cystic abnormality seen in the right testicle most consistent with benign abnormality such as tubular ectasia of rete testes, but follow-up ultrasound in 3 months is recommended to ensure stability and to rule out mass. Probable small right hydrocele. Electronically Signed   By: Marijo Conception M.D.   On: 12/31/2020 18:30   DG HIP OPERATIVE UNILAT WITH PELVIS LEFT  Result Date: 12/30/2020 CLINICAL DATA:  LEFT hip arthroplasty EXAM: OPERATIVE LEFT HIP 1 VIEWS TECHNIQUE: Fluoroscopic spot image(s) were submitted for interpretation post-operatively. COMPARISON:  December 29, 2020 FINDINGS: Spot fluoroscopy images were obtained for surgical planning purposes. Partial visualization of a LEFT hip arthroplasty. Clamp projects over the upper pelvis. IMPRESSION: Spot fluoroscopy images were obtained for surgical  planning purposes. Electronically Signed   By: Valentino Saxon M.D.   On: 12/30/2020 12:11   DG Femur Min 2 Views Left  Result Date: 12/29/2020 CLINICAL DATA:  LEFT hip pain after fall EXAM: LEFT FEMUR 2 VIEWS COMPARISON:  None. FINDINGS: There is varus angulation of the LEFT femoral head in relation to the femoral neck. There is a cortical step-off along the inferior medial margin of the femoral neck. No fracture of the femoral shaft. The knee joint appears normal on two views. IMPRESSION: Concern for LEFT femoral neck fracture with varus angulation. Consider dedicated exam of the LEFT hip/pelvis or CT LEFT hip. Electronically Signed   By: Suzy Bouchard M.D.   On: 12/29/2020 14:12     The results of significant diagnostics from this hospitalization (including imaging, microbiology, ancillary and laboratory) are listed below for reference.     Microbiology: Recent Results (from the past 240 hour(s))  Resp Panel by RT-PCR (Flu A&B, Covid) Nasopharyngeal Swab     Status: None   Collection Time: 12/29/20  2:26 PM   Specimen: Nasopharyngeal Swab; Nasopharyngeal(NP) swabs in vial transport medium  Result Value Ref Range Status   SARS Coronavirus 2 by RT PCR NEGATIVE NEGATIVE Final    Comment: (NOTE) SARS-CoV-2 target nucleic acids are NOT DETECTED.  The SARS-CoV-2 RNA is generally detectable in upper respiratory specimens during the acute phase of infection. The lowest concentration of SARS-CoV-2 viral copies this assay can detect is 138 copies/mL. A negative result does not preclude SARS-Cov-2 infection and should not be used as the sole basis for treatment or other patient management decisions. A negative result may occur with  improper specimen collection/handling, submission of specimen other than nasopharyngeal swab, presence of viral mutation(s) within the areas targeted by this assay, and inadequate number of viral copies(<138 copies/mL). A negative result must be combined  with clinical observations, patient history, and epidemiological information. The expected result is Negative.  Fact Sheet for Patients:  EntrepreneurPulse.com.au  Fact Sheet for Healthcare Providers:  IncredibleEmployment.be  This test is no t yet approved or cleared by the [place] FDA and  has been authorized for detection and/or diagnosis of SARS-CoV-2 by FDA under an Emergency  Use Authorization (EUA). This EUA will remain  in effect (meaning this test can be used) for the duration of the COVID-19 declaration under Section 564(b)(1) of the Act, 21 U.S.C.section 360bbb-3(b)(1), unless the authorization is terminated  or revoked sooner.       Influenza A by PCR NEGATIVE NEGATIVE Final   Influenza B by PCR NEGATIVE NEGATIVE Final    Comment: (NOTE) The Xpert Xpress SARS-CoV-2/FLU/RSV plus assay is intended as an aid in the diagnosis of influenza from Nasopharyngeal swab specimens and should not be used as a sole basis for treatment. Nasal washings and aspirates are unacceptable for Xpert Xpress SARS-CoV-2/FLU/RSV testing.  Fact Sheet for Patients: EntrepreneurPulse.com.au  Fact Sheet for Healthcare Providers: IncredibleEmployment.be  This test is not yet approved or cleared by the [place] FDA and has been authorized for detection and/or diagnosis of SARS-CoV-2 by FDA under an Emergency Use Authorization (EUA). This EUA will remain in effect (meaning this test can be used) for the duration of the COVID-19 declaration under Section 564(b)(1) of the Act, 21 U.S.C. section 360bbb-3(b)(1), unless the authorization is terminated or revoked.  Performed at [facility], [place] [address] [place], [place] [address]   Surgical pcr screen     Status: Abnormal   Collection Time: 12/29/20 11:11 PM   Specimen: Nasal Mucosa; Nasal Swab  Result Value Ref Range Status   MRSA, PCR POSITIVE (A)  NEGATIVE Corrected    Comment: RESULT CALLED TO, Hartman BACK BY AND VERIFIED WITH: RN SYLVIA M. 12/30/20@1 :00 BY TW    Staphylococcus aureus POSITIVE (A) NEGATIVE Corrected    Comment: (NOTE) The Xpert SA Assay (FDA approved for NASAL specimens in patients 54 years of age and older), is one component of a comprehensive surveillance program. It is not intended to diagnose infection nor to guide or monitor treatment. Performed at [facility], [place] [address], [place], [place] [address] CORRECTED ON 09/24 AT 0114: PREVIOUSLY REPORTED AS POSITIVE      Labs: BNP (last 3 results) Recent Labs    09/10/20 1108  BNP 36.4   Basic Metabolic Panel: Recent Labs  Lab 01/02/21 0117 01/03/21 0352 01/04/21 0553 01/05/21 0312 01/06/21 0306 01/07/21 0322 01/08/21 0504  NA 128* 128* 129* 131* 130* 129* 133*  K 4.2 3.7 3.8 3.8 4.1 3.8 4.1  CL 94* 95* 98 101 100 98 98  CO2 25 26 22 22  19* 23 25  GLUCOSE 130* 169* 135* 104* 181* 197* 210*  BUN 25* 22 17 17 23 20 19   CREATININE 1.12 1.09 0.97 1.12 1.27* 0.94 0.90  CALCIUM 8.7* 8.3* 8.3* 8.1* 8.1* 7.7* 7.9*  MG 2.0 2.0 1.8 2.0 2.0 1.9 2.0  PHOS 3.7 3.9  --   --   --   --   --    Liver Function Tests: Recent Labs  Lab 01/02/21 0117 01/03/21 0352  AST 16 28  ALT 11 18  ALKPHOS 81 88  BILITOT 0.9 0.9  PROT 6.3* 5.9*  ALBUMIN 2.2* 1.9*   No results for input(s): LIPASE, AMYLASE in the last 168 hours. No results for input(s): AMMONIA in the last 168 hours. CBC: Recent Labs  Lab 01/02/21 0117 01/03/21 0352 01/04/21 0553 01/05/21 0312 01/06/21 0306 01/07/21 0322 01/08/21 0504  WBC 15.4* 12.1* 12.1* 14.5* 15.4* 13.5* 17.1*  NEUTROABS 12.8* 9.3*  --   --   --   --   --   HGB 9.7* 9.5* 8.7* 9.0* 10.0* 8.6* 9.1*  HCT 29.9*  28.3* 26.6* 27.5* 31.2* 26.6* 28.8*  MCV 89.3 88.2 88.7 88.4 90.4 89.0 90.0  PLT 192 211 218 288 254 324 359   Cardiac Enzymes: No results for input(s): CKTOTAL, CKMB, CKMBINDEX, TROPONINI in the last 168  hours. BNP: Invalid input(s): POCBNP CBG: Recent Labs  Lab 01/07/21 0757 01/07/21 1637 01/07/21 2056 01/08/21 0623 01/08/21 1141  GLUCAP 165* 253* 309* 212* 207*   D-Dimer No results for input(s): DDIMER in the last 72 hours. Hgb A1c No results for input(s): HGBA1C in the last 72 hours. Lipid Profile No results for input(s): CHOL, HDL, LDLCALC, TRIG, CHOLHDL, LDLDIRECT in the last 72 hours. Thyroid function studies No results for input(s): TSH, T4TOTAL, T3FREE, THYROIDAB in the last 72 hours.  Invalid input(s): FREET3 Anemia work up No results for input(s): VITAMINB12, FOLATE, FERRITIN, TIBC, IRON, RETICCTPCT in the last 72 hours. Urinalysis    Component Value Date/Time   COLORURINE AMBER (A) 01/04/2021 1344   APPEARANCEUR TURBID (A) 01/04/2021 1344   LABSPEC 1.010 01/04/2021 1344   PHURINE 5.0 01/04/2021 1344   GLUCOSEU NEGATIVE 01/04/2021 1344   HGBUR MODERATE (A) 01/04/2021 1344   BILIRUBINUR NEGATIVE 01/04/2021 1344   KETONESUR NEGATIVE 01/04/2021 1344   PROTEINUR 100 (A) 01/04/2021 1344   UROBILINOGEN 0.2 12/04/2013 0210   NITRITE NEGATIVE 01/04/2021 1344   LEUKOCYTESUR LARGE (A) 01/04/2021 1344   Sepsis Labs Invalid input(s): PROCALCITONIN,  WBC,  LACTICIDVEN Microbiology Recent Results (from the past 240 hour(s))  Resp Panel by RT-PCR (Flu A&B, Covid) Nasopharyngeal Swab     Status: None   Collection Time: 12/29/20  2:26 PM   Specimen: Nasopharyngeal Swab; Nasopharyngeal(NP) swabs in vial transport medium  Result Value Ref Range Status   SARS Coronavirus 2 by RT PCR NEGATIVE NEGATIVE Final    Comment: (NOTE) SARS-CoV-2 target nucleic acids are NOT DETECTED.  The SARS-CoV-2 RNA is generally detectable in upper respiratory specimens during the acute phase of infection. The lowest concentration of SARS-CoV-2 viral copies this assay can detect is 138 copies/mL. A negative result does not preclude SARS-Cov-2 infection and should not be used as the sole basis  for treatment or other patient management decisions. A negative result may occur with  improper specimen collection/handling, submission of specimen other than nasopharyngeal swab, presence of viral mutation(s) within the areas targeted by this assay, and inadequate number of viral copies(<138 copies/mL). A negative result must be combined with clinical observations, patient history, and epidemiological information. The expected result is Negative.  Fact Sheet for Patients:  EntrepreneurPulse.com.au  Fact Sheet for Healthcare Providers:  IncredibleEmployment.be  This test is no t yet approved or cleared by the [place] FDA and  has been authorized for detection and/or diagnosis of SARS-CoV-2 by FDA under an Emergency Use Authorization (EUA). This EUA will remain  in effect (meaning this test can be used) for the duration of the COVID-19 declaration under Section 564(b)(1) of the Act, 21 U.S.C.section 360bbb-3(b)(1), unless the authorization is terminated  or revoked sooner.       Influenza A by PCR NEGATIVE NEGATIVE Final   Influenza B by PCR NEGATIVE NEGATIVE Final    Comment: (NOTE) The Xpert Xpress SARS-CoV-2/FLU/RSV plus assay is intended as an aid in the diagnosis of influenza from Nasopharyngeal swab specimens and should not be used as a sole basis for treatment. Nasal washings and aspirates are unacceptable for Xpert Xpress SARS-CoV-2/FLU/RSV testing.  Fact Sheet for Patients: EntrepreneurPulse.com.au  Fact Sheet for Healthcare Providers: IncredibleEmployment.be  This test is not yet  approved or cleared by the [place] and has been authorized for detection and/or diagnosis of SARS-CoV-2 by FDA under an Emergency Use Authorization (EUA). This EUA will remain in effect (meaning this test can be used) for the duration of the COVID-19 declaration under Section 564(b)(1) of the Act, 21  U.S.C. section 360bbb-3(b)(1), unless the authorization is terminated or revoked.  Performed at [facility], [place] [address] [place], [place] [address]   Surgical pcr screen     Status: Abnormal   Collection Time: 12/29/20 11:11 PM   Specimen: Nasal Mucosa; Nasal Swab  Result Value Ref Range Status   MRSA, PCR POSITIVE (A) NEGATIVE Corrected    Comment: RESULT CALLED TO, Hartman BACK BY AND VERIFIED WITH: RN SYLVIA M. 12/30/20@1 :00 BY TW    Staphylococcus aureus POSITIVE (A) NEGATIVE Corrected    Comment: (NOTE) The Xpert SA Assay (FDA approved for NASAL specimens in patients 69 years of age and older), is one component of a comprehensive surveillance program. It is not intended to diagnose infection nor to guide or monitor treatment. Performed at [facility], [place] [address], [place], [place] [address] CORRECTED ON 09/24 AT 0114: PREVIOUSLY REPORTED AS POSITIVE      Time coordinating discharge:  I have spent 35 minutes face to face with the patient and on the ward discussing the patients care, assessment, plan and disposition with other care givers. >50% of the time was devoted counseling the patient about the risks and benefits of treatment/Discharge disposition and coordinating care.   SIGNED:   Damita Lack, MD  [facility] 01/08/2021, 12:14 PM   If 7PM-7AM, please contact night-coverage

## 2021-01-08 NOTE — Consult Note (Signed)
[facility] New Psychiatric Evaluation   Service Date: January 08, 2021 LOS:  LOS: 10 days    Assessment  Jason Hartman is a 85 y.o. male admitted medically for 12/29/2020 12:45 PM for a hip fracture. He carries the psychiatric diagnoses of anxiety and insomnia (per wife) and has a complicated past medical history most pertinent for recurrent UTIs.Psychiatry was consulted for agitation/hallucinations by Dr. Reesa Chew.  His current presentation of waxing and waning levels of attention and orientation is most consistent with delirium. I have a large concern he has underlying parkinsonian features (perhaps LBD as hallucinations appeared early in course, vivid dreams) given my exam and wife's collateral; also has numerous atypical responses to psychotropic medications.  Current outpatient psychotropic medications include amitriptyline, klonopin and risperidone and historically he has had a poor response to these medications as evidenced by worsening confusion since this started. He was likely compliant with medications prior to admission as evidenced by in SNF prior to admission. On initial examination, patient continually screamed out "hlep me" and had difficulty with orientation questions. Please see plan below for detailed recommendations.   10/3: pt with significant improvement (wife at bedside). Overall less confused over past several days especially since UTI treated. Still having vivid dreams, intermittently confused and inattentive - no med changes today as pt discharging to rehab.   Diagnoses:  Active Hospital problems: Principal Problem:   Hip fracture (HCC) Active Problems:   Type II diabetes mellitus ([place])   OBSTRUCTIVE SLEEP APNEA   Essential hypertension   Atherosclerotic heart disease of native coronary artery without angina pectoris   Anxiety   PAF (paroxysmal atrial fibrillation) ([place])    Problems edited/added by me: No problems updated.  Plan  ## Safety and  Observation Level:  - Based on my clinical evaluation, I estimate the patient to be at low risk of self harm in the current setting; has not attempted to get out of bed - At this time, we recommend a standard level of observation. This decision is based on my review of the chart including patient's history and current presentation, interview of the patient, mental status examination, and consideration of suicide risk including evaluating suicidal ideation, plan, intent, suicidal or self-harm behaviors, risk factors, and protective factors. This judgment is based on our ability to directly address suicide risk, implement suicide prevention strategies and develop a safety plan while the patient is in the clinical setting. Please contact our team if there is a concern that risk level has changed. -- low threshold to escalate to 1:1 sitter if pt begins to attempt to get out of bed  ## Medications:    DECREASE: klonopin from 0.5 mg BID to 0.25/0.5 mg  - would recommend further taper as outpt; BZDs are new start in pt's 34s and deliriogenic.  START: olanzapine 2.5 mg QHS and BID PRN  - has required PRN once in past few days CONTINUE: gabapentin 300 mg qhs at present   Stopped earlier this admission:  STOP: - risperdal, elavil, PRN haldol  - risperdal/haldol are more likely to worsen parkinsonian features as dose escalated  - elevil is underdosed for anxiety, contributing to anticholinergic burden ## Medical Decision Making Capacity:  Not formally assessed  ## Further Work-up:  -- u/a -- repeat EKG  - qtc 496 prev 484, pt overall on less qtc prolonging meds since it was taken 9/29 (risperdal, haldol, elavil all d/c)  -- monitor & keep K+>4 and Mg+>2   ## Disposition:  --  no psychiatric contraindication to dc when medically appropriate  ## Behavioral / Environmental:  -- frequent reorientation, ordering delirium precautions  ##Legal Status   Thank you for this consult request.  Recommendations have been communicated to the primary team.  We will sign off at this time.   [place] A Beauden [place]    NEW  history  Relevant Aspects of Hospital Course:  Admitted on 12/29/2020 for a bad fall. U/a was (+) UTI for which he has been treated.  Patient Report:  Patient seen in midafternoon with wife at bedside. Overall he is oriented to self, situation. He is inattentive and frequently lapses into sleep, but on formal attention testing able to perform DOWB with no issues. No SI, HI, AH/VH except as documented in patient's "nightmares". He is reacting to these when he wakes up (ie looking for his wife when he dreams something has happened to her) but is per report redirectable and his reality testing is improving. Reoriented where appropriate.   No further pulling at gown, revealing self, continually calling out to God to help him. Able to discuss his experiences with delirium and his nightmares. Able to say that he sometimes prefers his nightmares as he is young and healthy in them.   ROS:  Fatigue/falling asleep, nightmares, otherwise unable to perform  Collateral information:  Wife at bedside has some concerns for ongoing confusion although overall much less confused than previously. Continues to have vivid dreams - sometimes nightmares, sometimes not (one recently was about fishing). She was impressed by his ability to do DOWB.   Psychiatric History:  Information collected from wife No smoking, drinking. Hasn't smoked in almost 40 years, drunk for about 10  Family psych history: none  Medical History: Past Medical History:  Diagnosis Date   CAD (coronary artery disease)    s/p stents   Cellulitis and abscess of leg, except foot    Hypertension    Obstructive sleep apnea (adult) (pediatric)    Restless legs syndrome (RLS)    Thoracic or lumbosacral neuritis or radiculitis, unspecified    Type II or unspecified type diabetes mellitus without mention of complication,  not stated as uncontrolled    Unspecified disease of pericardium    Unspecified hereditary and idiopathic peripheral neuropathy     Surgical History: Past Surgical History:  Procedure Laterality Date   ANAL FISSURE REPAIR     ANTERIOR APPROACH HEMI HIP ARTHROPLASTY Left 12/30/2020   Procedure: ANTERIOR APPROACH HIP ARTHROPLASTY;  Surgeon: Vanetta Mulders, MD;  Location: [facility];  Service: Orthopedics;  Laterality: Left;   pericarditis  2009    Medications:   Current Facility-Administered Medications:    (feeding supplement) PROSource Plus liquid 30 mL, 30 mL, Oral, TID BM, Amin, Ankit Chirag, MD, 30 mL at 01/08/21 1359   amiodarone (PACERONE) tablet 200 mg, 200 mg, Oral, Daily, Karmen Bongo, MD, 200 mg at 01/08/21 0846   apixaban (ELIQUIS) tablet 5 mg, 5 mg, Oral, BID, Mignon Pine, RPH, 5 mg at 01/08/21 0846   bisacodyl (DULCOLAX) EC tablet 5 mg, 5 mg, Oral, Daily PRN, Karmen Bongo, MD   clonazePAM Bobbye Charleston) disintegrating tablet 0.25 mg, 0.25 mg, Oral, q morning, Joban Colledge A, 0.25 mg at 01/08/21 0900   clonazePAM (KLONOPIN) tablet 0.5 mg, 0.5 mg, Oral, QHS, Latara Micheli A, 0.5 mg at 01/07/21 2120   docusate sodium (COLACE) capsule 100 mg, 100 mg, Oral, BID, Karmen Bongo, MD, 100 mg at 01/08/21 0847   gabapentin (NEURONTIN) capsule 300 mg, 300 mg,  Oral, Ivery Quale, MD, 300 mg at 01/07/21 2120   HYDROcodone-acetaminophen (NORCO/VICODIN) 5-325 MG per tablet 1-2 tablet, 1-2 tablet, Oral, Q4H PRN, Damita Lack, MD, 2 tablet at 01/08/21 1119   HYDROmorphone (DILAUDID) injection 0.5 mg, 0.5 mg, Intravenous, Q3H PRN, Vanetta Mulders, MD, 0.5 mg at 01/07/21 0948   insulin aspart (novoLOG) injection 0-15 Units, 0-15 Units, Subcutaneous, TID WC, Karmen Bongo, MD, 5 Units at 01/08/21 1230   insulin aspart (novoLOG) injection 0-5 Units, 0-5 Units, Subcutaneous, QHS, Karmen Bongo, MD, 4 Units at 01/07/21 2118   insulin glargine-yfgn (SEMGLEE)  injection 15 Units, 15 Units, Subcutaneous, BID, Amin, Ankit Chirag, MD   ipratropium-albuterol (DUONEB) 0.5-2.5 (3) MG/3ML nebulizer solution 3 mL, 3 mL, Nebulization, Q4H PRN, Amin, Ankit Chirag, MD   melatonin tablet 5 mg, 5 mg, Oral, QHS, Karmen Bongo, MD, 5 mg at 01/07/21 2120   meropenem (MERREM) 1 g in sodium chloride 0.9 % 100 mL IVPB, 1 g, Intravenous, Q12H, Amin, Ankit Chirag, MD, Last Rate: 200 mL/hr at 01/08/21 1135, 1 g at 01/08/21 1135   methocarbamol (ROBAXIN) tablet 500 mg, 500 mg, Oral, Q6H PRN, 500 mg at 01/07/21 1406 **OR** methocarbamol (ROBAXIN) 500 mg in dextrose 5 % 50 mL IVPB, 500 mg, Intravenous, Q6H PRN, Karmen Bongo, MD   metoprolol tartrate (LOPRESSOR) tablet 12.5 mg, 12.5 mg, Oral, BID, Karmen Bongo, MD, 12.5 mg at 01/08/21 0846   multivitamin with minerals tablet 1 tablet, 1 tablet, Oral, Daily, Elodia Florence., MD, 1 tablet at 01/08/21 0848   OLANZapine (ZYPREXA) tablet 2.5 mg, 2.5 mg, Oral, QHS, Mariadel Mruk A, 2.5 mg at 01/07/21 2119   OLANZapine (ZYPREXA) tablet 2.5 mg, 2.5 mg, Oral, BID PRN, Pema Thomure A, 2.5 mg at 01/07/21 1403   polyethylene glycol (MIRALAX / GLYCOLAX) packet 17 g, 17 g, Oral, BID, Elodia Florence., MD, 17 g at 01/08/21 0847   traZODone (DESYREL) tablet 50 mg, 50 mg, Oral, QHS PRN, Amin, Jeanella Flattery, MD  Allergies: Allergies  Allergen Reactions   Oxycodone Anaphylaxis   Iodinated Diagnostic Agents Hives    alleric to renografin,isovue & omnipaque, hives, requires 13 hr prep//a.calhoun, Onset Date: [id number]      Iodine Hives and Rash    alleric to renografin,isovue & omnipaque, hives, requires 13 hr prep//a.calhoun, Onset Date: [id number] allergic to renografin,isovue & omnipaque, hives, requires 13 hr prep//a.calhoun, Onset Date: [id number]   Linezolid Hives and Rash        Methadone Hcl Other (See Comments)    hallucinations    Promethazine Other (See Comments)    Delirium (pulled out IV),  erratic behavior Mental status change     Isovue [Iopamidol] Hives   Morphine Sulfate Other (See Comments)    Unknown reaction   Omnipaque [Iohexol] Hives    Code: HIVES, Desc: alleric to renografin,isovue & omnipaque, hives, requires 13 hr prep//a.calhoun, Onset Date: [id number]    Quetiapine Other (See Comments)    Unknown reaction   Renografin [Diatrizoate] Hives   Statins Other (See Comments)    Muscle weakness   Zolpidem Other (See Comments)    Erratic behavior/delusions Altered mental status   Atorvastatin Itching and Other (See Comments)    Tired, weakness    Carbidopa-Levodopa Anxiety   Chlorhexidine Gluconate [Chlorhexidine] Hives and Rash   Clindamycin Rash   Colesevelam Other (See Comments)    tired    Doxazosin Rash   Lovastatin Other (See Comments)    Tired, nervousness    Rosuvastatin Other (  See Comments)    Tired, weakness     Social History:  Married for 31 years, together for 40 years.   Tobacco use: none x 50 years Alcohol use: none for past several years Drug use: denies  Family History: no family psych history.  The patient's family history includes Diabetes in his father.    Objective  Vital signs:  Temp:  [98.2 F (36.8 C)-98.4 F (36.9 C)] 98.4 F (36.9 C) (10/03 0751) Pulse Rate:  [88-92] 88 (10/03 0751) Resp:  [17-19] 17 (10/03 0751) BP: (132-158)/(63-72) 158/72 (10/03 0751) SpO2:  [98 %] 98 % (10/03 0751)  Physical Exam: Gen: Comfortable, tired, frequently falling asleep.  Pulm: no increased WOB G/u: not visible, pt kept himself covered up.  Neuro: CNII-XII grossly intact. Some cogwheeling at wrists.  Psych: oriented x2  Mild cogwheeling R arm  Mental Status Exam: Appearance: Tired, lying in bed,   Attitude:  Cooperative  Behavior/Psychomotor: No psychomotor agitation noted  Speech/Language:  Some latency, normal amount, prosody  Mood: Tired  Affect: Congruent, full range  Thought process: Concrete  Thought content:    Denied SI/HI when asked  Perceptual disturbances:  Vivid dreams/nightmares,   Attention: DOWB no errors, does lapse and fall asleep  Concentration: Poor  Orientation: Self, situation,  Memory: Recent memory intact (knew he had a fall and couldn't get out of bed, specifics of conversations with his wife)  Fund of knowledge:  Not formally assessed   Insight:   Fair/improving (knew he was confused, insight into nightmares)  Judgment:  poor  Impulse Control: poor

## 2021-01-08 NOTE — Progress Notes (Signed)
Physical Therapy Treatment Patient Details Name: Jason Hartman MRN: [id number] DOB: 10/21/1935 Today's Date: 01/08/2021   History of Present Illness Jason Hartman is a 85 y.o. male presenting with fall.  He was in [facility] for deconditioning after a severe UTI.  He was attempting to transfer from wheelchair to bed without assistance when he fell and landed on his hip.  Pt with left femoral neck fracture with left hemiarthroplasty on 9/24.  PMH:  HTN; OSA; RLS; and DM    PT Comments    Pt admitted with above diagnosis. Pt continues to be unable to transition out of bed.  Pt needs tot to max assist to sit eOB for up to 10 minutes. Pt self limiting with incr pain with any movement.  Pt currently with functional limitations due to balance and endurance deficits. Pt will benefit from skilled PT to increase their independence and safety with mobility to allow discharge to the venue listed below.      Recommendations for follow up therapy are one component of a multi-disciplinary discharge planning process, led by the attending physician.  Recommendations may be updated based on patient status, additional functional criteria and insurance authorization.  Follow Up Recommendations  SNF     Equipment Recommendations  Other (comment) (TBA)    Recommendations for Other Services       Precautions / Restrictions Precautions Precautions: Anterior Hip Precaution Booklet Issued: Yes (comment) Precaution Comments: Discussed precautions Restrictions LLE Weight Bearing: Weight bearing as tolerated     Mobility  Bed Mobility Overal bed mobility: Needs Assistance Bed Mobility: Supine to Sit;Sit to Supine     Supine to sit: +2 for physical assistance;HOB elevated;Total assist Sit to supine: Total assist;+2 for physical assistance   General bed mobility comments: Pt assisted to EOB with total assist of 2 persons. Pt total assist initially as he leans posteriorly and to his right with pt  resisting movement and did not want to come to EOB. Pt did progress to  mod assist sitting with cues and assist to sit better at EOB.  Attempts to use Stedy futile as pt would not reach for the [facility].  Pt very difficult as he has difficulty focusing on tasks.  Pt with BM therefore laid pt back down when it was apparent he wasnt going to be able to stand and cleaned pt with total  assist.    Transfers                 General transfer comment: Unable at this time  Ambulation/Gait                 Stairs             Wheelchair Mobility    Modified Rankin (Stroke Patients Only)       Balance Overall balance assessment: Needs assistance Sitting-balance support: Bilateral upper extremity supported;Feet supported Sitting balance-Leahy Scale: Zero Sitting balance - Comments: Pt sat eOB 10 min with periods of mod assist with cues and assist to get pt to upright sitting.  Pt leans to right and has difficulty using UEs effectively to assist with balance even with assist and cues. Tried multiple methods to get pt comfortable with sitting unsupported such as propping on elbow, reaching forward on STedy but continued to need mod assist at best.  Cognition Arousal/Alertness: Awake/alert Behavior During Therapy: Anxious Overall Cognitive Status: No family/caregiver present to determine baseline cognitive functioning                                        Exercises General Exercises - Lower Extremity Ankle Circles/Pumps: AROM;Both;10 reps;Supine Quad Sets: AROM;Both;10 reps;Supine Heel Slides: AAROM;Both;5 reps;Supine    General Comments        Pertinent Vitals/Pain Pain Assessment: Faces Faces Pain Scale: Hurts worst Pain Location: left hip/leg Pain Descriptors / Indicators: Aching;Discomfort;Grimacing;Guarding Pain Intervention(s): Limited activity within patient's tolerance;Monitored during  session;Repositioned;Patient requesting pain meds-RN notified    Home Living                      Prior Function            PT Goals (current goals can now be found in the care plan section) Acute Rehab PT Goals Patient Stated Goal: to get rid of pain Progress towards PT goals: Not progressing toward goals - comment (incr pain and self limiting)    Frequency    Min 3X/week      PT Plan Current plan remains appropriate    Co-evaluation              AM-PAC PT "6 Clicks" Mobility   Outcome Measure  Help needed turning from your back to your side while in a flat bed without using bedrails?: Total Help needed moving from lying on your back to sitting on the side of a flat bed without using bedrails?: Total Help needed moving to and from a bed to a chair (including a wheelchair)?: Total Help needed standing up from a chair using your arms (e.g., wheelchair or bedside chair)?: Total Help needed to walk in hospital room?: Total Help needed climbing 3-5 steps with a railing? : Total 6 Click Score: 6    End of Session Equipment Utilized During Treatment: Gait belt Activity Tolerance: Patient limited by fatigue;Patient limited by pain Patient left: in bed;with call bell/phone within reach;with bed alarm set Nurse Communication: Mobility status;Need for lift equipment PT Visit Diagnosis: Unsteadiness on feet (R26.81);Muscle weakness (generalized) (M62.81);Pain Pain - Right/Left: Left Pain - part of body: Hip     Time: 1032-1109 PT Time Calculation (min) (ACUTE ONLY): 37 min  Charges:  $Therapeutic Exercise: 8-22 mins $Therapeutic Activity: 8-22 mins                     Caitlyne Ingham M,PT Acute Rehab Services [phone] (pager)    Alvira Philips 01/08/2021, 2:11 PM

## 2021-01-08 NOTE — TOC Transition Note (Signed)
Transition of Care [facility]) - CM/SW Discharge Note   Patient Details  Name: Jason Hartman MRN: [id number] Date of Birth: Jul 24, 1935  Transition of Care [facility]) CM/SW Contact:  Milinda Antis, [place] Phone Number: 01/08/2021, 1:20 PM   Clinical Narrative:    Patient will DC to: [place] date: 01/08/2021 Family notified: Yes Transport by: Corey Harold   Per MD patient ready for DC to SNF. RN to call report prior to discharge [phone]. RN, patient, patient's family, and facility notified of DC. Discharge Summary and FL2 sent to facility. DC packet on chart. Ambulance transport will be requested for patient when RN reports that the patient is ready.   CSW will sign off for now as social work intervention is no longer needed. Please consult [place] again if new needs arise.     Final next level of care: [place] Barriers to Discharge: Continued Medical Work up   Patient Goals and CMS Choice        Discharge Placement              Patient chooses bed at: [facility] Patient to be transferred to facility by: [place] Name of family member notified: Leo, Fray (Spouse)   [phone] Patient and family notified of of transfer: 01/08/21  Discharge Plan and Services In-house Referral: Clinical Social Work                                   Social Determinants of Health (SDOH) Interventions     Readmission Risk Interventions No flowsheet data found.

## 2021-01-08 NOTE — Progress Notes (Signed)
Discharge summary provided to pt's spouse as requested.

## 2021-01-08 NOTE — Progress Notes (Signed)
Report given to Tanesha,staff nurse at [facility]. All questions and concerns were fully addressed.

## 2021-01-08 NOTE — Plan of Care (Signed)
  Problem: Education: Goal: Knowledge of General Education information will improve Description: Including pain rating scale, medication(s)/side effects and non-pharmacologic comfort measures Outcome: Progressing   Problem: Coping: Goal: Level of anxiety will decrease Outcome: Progressing   Problem: Safety: Goal: Ability to remain free from injury will improve Outcome: Progressing   

## 2021-01-10 DIAGNOSIS — I480 Paroxysmal atrial fibrillation: Secondary | ICD-10-CM | POA: Diagnosis not present

## 2021-01-10 DIAGNOSIS — G4733 Obstructive sleep apnea (adult) (pediatric): Secondary | ICD-10-CM | POA: Diagnosis not present

## 2021-01-10 DIAGNOSIS — E782 Mixed hyperlipidemia: Secondary | ICD-10-CM | POA: Diagnosis not present

## 2021-01-10 DIAGNOSIS — I10 Essential (primary) hypertension: Secondary | ICD-10-CM | POA: Diagnosis not present

## 2021-01-10 DIAGNOSIS — Z7984 Long term (current) use of oral hypoglycemic drugs: Secondary | ICD-10-CM | POA: Diagnosis not present

## 2021-01-10 DIAGNOSIS — N401 Enlarged prostate with lower urinary tract symptoms: Secondary | ICD-10-CM | POA: Diagnosis not present

## 2021-01-10 DIAGNOSIS — E1122 Type 2 diabetes mellitus with diabetic chronic kidney disease: Secondary | ICD-10-CM | POA: Diagnosis not present

## 2021-01-10 DIAGNOSIS — M9702XA Periprosthetic fracture around internal prosthetic left hip joint, initial encounter: Secondary | ICD-10-CM | POA: Diagnosis not present

## 2021-01-10 DIAGNOSIS — G20 Parkinson's disease: Secondary | ICD-10-CM | POA: Diagnosis not present

## 2021-01-11 ENCOUNTER — Emergency Department (HOSPITAL_COMMUNITY): Payer: Medicare Other

## 2021-01-11 ENCOUNTER — Emergency Department (HOSPITAL_COMMUNITY)
Admission: EM | Admit: 2021-01-11 | Discharge: 2021-01-11 | Disposition: A | Discharge status: Skilled Nursing Facility | Payer: Medicare Other | Attending: Emergency Medicine | Admitting: Emergency Medicine

## 2021-01-11 DIAGNOSIS — I6381 Other cerebral infarction due to occlusion or stenosis of small artery: Secondary | ICD-10-CM | POA: Diagnosis not present

## 2021-01-11 DIAGNOSIS — I739 Peripheral vascular disease, unspecified: Secondary | ICD-10-CM | POA: Diagnosis not present

## 2021-01-11 DIAGNOSIS — M25551 Pain in right hip: Secondary | ICD-10-CM | POA: Diagnosis not present

## 2021-01-11 DIAGNOSIS — R296 Repeated falls: Secondary | ICD-10-CM | POA: Diagnosis not present

## 2021-01-11 DIAGNOSIS — S79912A Unspecified injury of left hip, initial encounter: Secondary | ICD-10-CM | POA: Insufficient documentation

## 2021-01-11 DIAGNOSIS — N401 Enlarged prostate with lower urinary tract symptoms: Secondary | ICD-10-CM | POA: Diagnosis not present

## 2021-01-11 DIAGNOSIS — W19XXXA Unspecified fall, initial encounter: Secondary | ICD-10-CM

## 2021-01-11 DIAGNOSIS — Z9889 Other specified postprocedural states: Secondary | ICD-10-CM | POA: Diagnosis not present

## 2021-01-11 DIAGNOSIS — I10 Essential (primary) hypertension: Secondary | ICD-10-CM | POA: Diagnosis not present

## 2021-01-11 DIAGNOSIS — Z87891 Personal history of nicotine dependence: Secondary | ICD-10-CM | POA: Insufficient documentation

## 2021-01-11 DIAGNOSIS — I484 Atypical atrial flutter: Secondary | ICD-10-CM | POA: Diagnosis not present

## 2021-01-11 DIAGNOSIS — G20 Parkinson's disease: Secondary | ICD-10-CM | POA: Diagnosis not present

## 2021-01-11 DIAGNOSIS — E119 Type 2 diabetes mellitus without complications: Secondary | ICD-10-CM | POA: Diagnosis not present

## 2021-01-11 DIAGNOSIS — J0130 Acute sphenoidal sinusitis, unspecified: Secondary | ICD-10-CM | POA: Diagnosis not present

## 2021-01-11 DIAGNOSIS — W06XXXA Fall from bed, initial encounter: Secondary | ICD-10-CM | POA: Insufficient documentation

## 2021-01-11 DIAGNOSIS — I2510 Atherosclerotic heart disease of native coronary artery without angina pectoris: Secondary | ICD-10-CM | POA: Insufficient documentation

## 2021-01-11 DIAGNOSIS — S79911A Unspecified injury of right hip, initial encounter: Secondary | ICD-10-CM | POA: Diagnosis not present

## 2021-01-11 DIAGNOSIS — M9702XD Periprosthetic fracture around internal prosthetic left hip joint, subsequent encounter: Secondary | ICD-10-CM | POA: Diagnosis not present

## 2021-01-11 DIAGNOSIS — Z96642 Presence of left artificial hip joint: Secondary | ICD-10-CM | POA: Insufficient documentation

## 2021-01-11 DIAGNOSIS — M25559 Pain in unspecified hip: Secondary | ICD-10-CM

## 2021-01-11 DIAGNOSIS — S0990XA Unspecified injury of head, initial encounter: Secondary | ICD-10-CM | POA: Diagnosis not present

## 2021-01-11 LAB — CBC
HCT: 32.7 % — ABNORMAL LOW (ref 39.0–52.0)
Hemoglobin: 10.1 g/dL — ABNORMAL LOW (ref 13.0–17.0)
MCH: 28.3 pg (ref 26.0–34.0)
MCHC: 30.9 g/dL (ref 30.0–36.0)
MCV: 91.6 fL (ref 80.0–100.0)
Platelets: 477 10*3/uL — ABNORMAL HIGH (ref 150–400)
RBC: 3.57 MIL/uL — ABNORMAL LOW (ref 4.22–5.81)
RDW: 13.9 % (ref 11.5–15.5)
WBC: 22.6 10*3/uL — ABNORMAL HIGH (ref 4.0–10.5)
nRBC: 0 % (ref 0.0–0.2)

## 2021-01-11 LAB — BASIC METABOLIC PANEL
Anion gap: 9 (ref 5–15)
BUN: 19 mg/dL (ref 8–23)
CO2: 26 mmol/L (ref 22–32)
Calcium: 8.1 mg/dL — ABNORMAL LOW (ref 8.9–10.3)
Chloride: 96 mmol/L — ABNORMAL LOW (ref 98–111)
Creatinine, Ser: 0.94 mg/dL (ref 0.61–1.24)
GFR, Estimated: >60 mL/min (ref >60)
Glucose, Bld: 209 mg/dL — ABNORMAL HIGH (ref 70–99)
Potassium: 4 mmol/L (ref 3.5–5.1)
Sodium: 131 mmol/L — ABNORMAL LOW (ref 135–145)

## 2021-01-11 IMAGING — CR DG HIP (WITH OR WITHOUT PELVIS) 2-3V*R*
3 series · 3 of 3 positions shown · non-contrast
Comparison: Postoperative pelvis radiograph [DATE]

CLINICAL DATA: 84-year-old male status recently discharged after
being treated for a fall which resulted in left hip fracture. Repeat
hip injury today.

EXAM:
DG HIP (WITH OR WITHOUT PELVIS) 2-3V RIGHT

[pelvis ap]
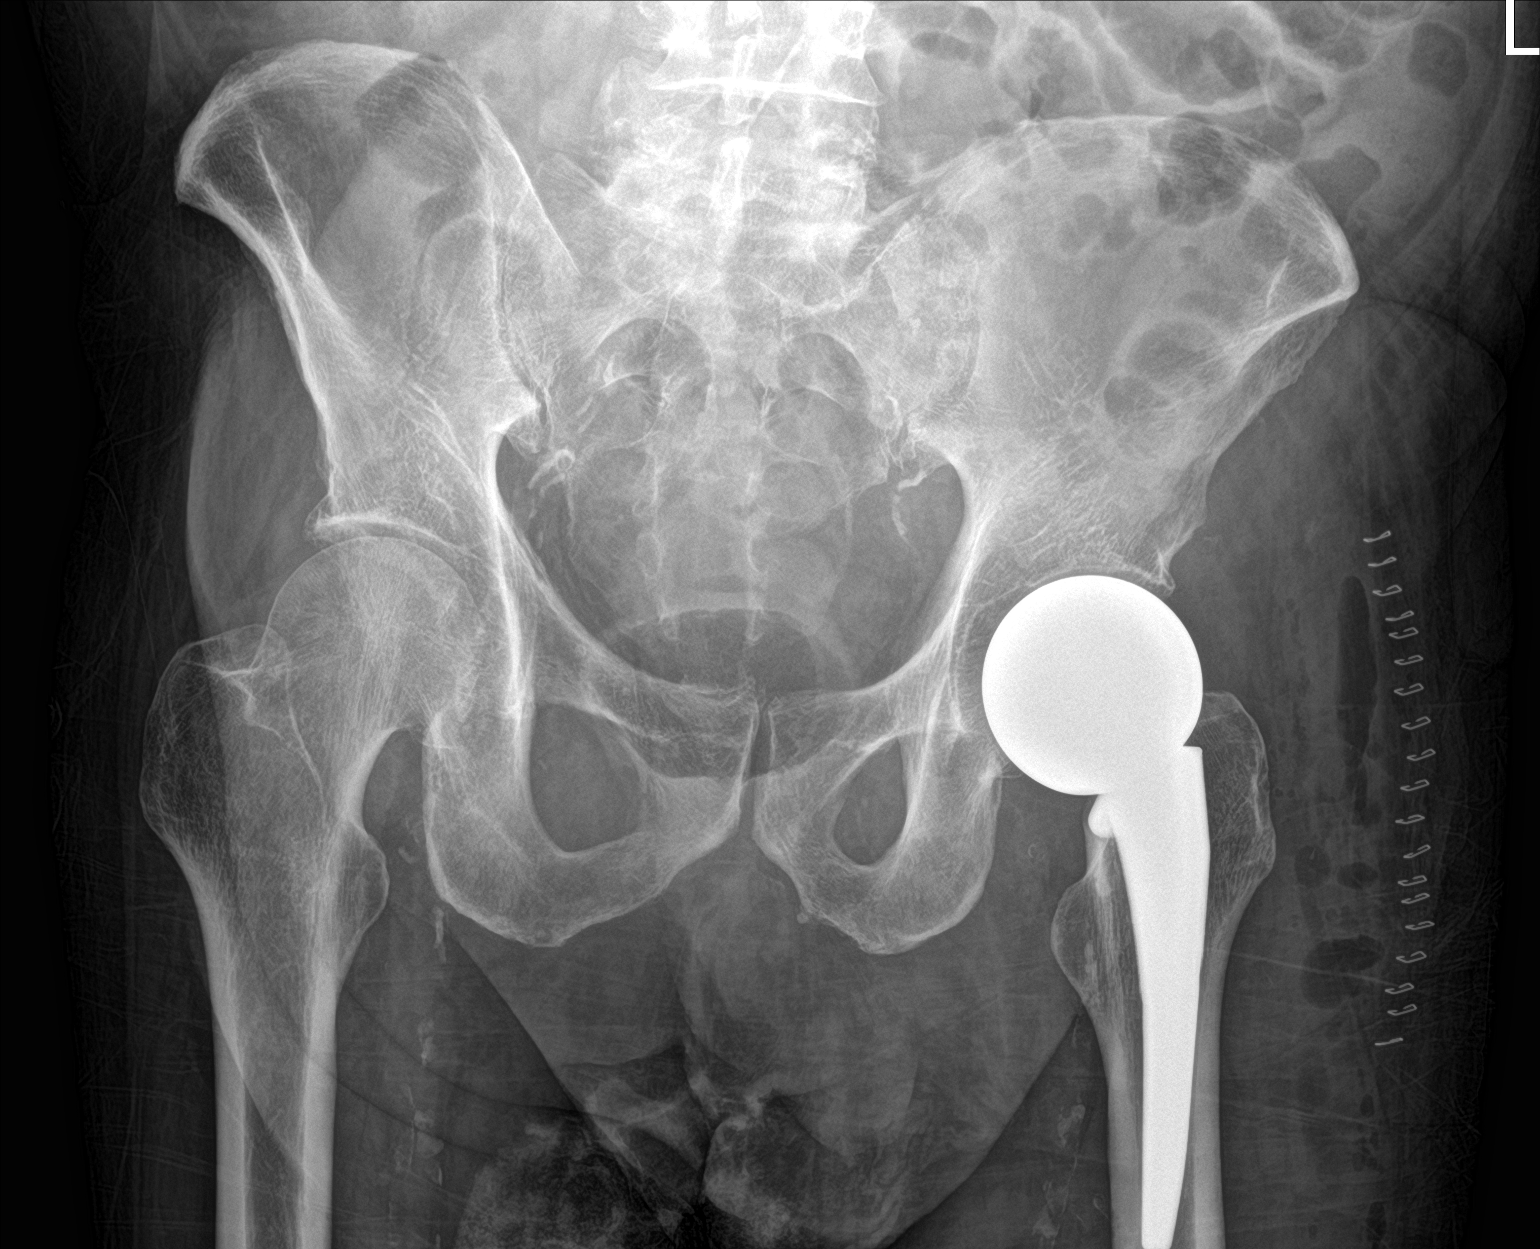

[hip ap]
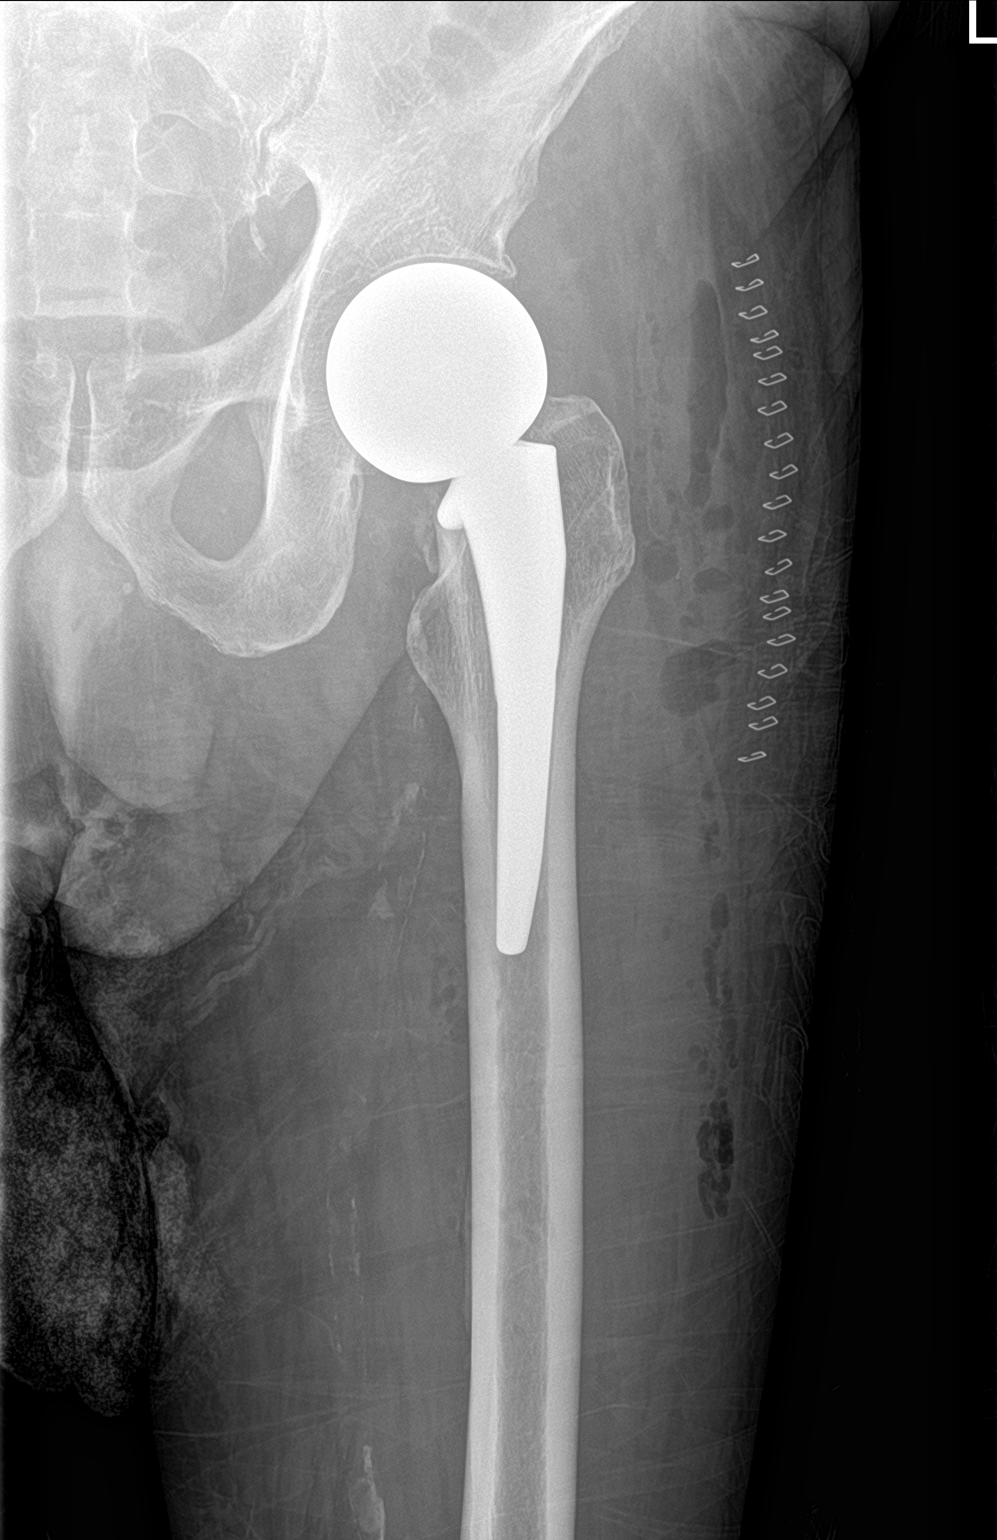

[hip x-table]
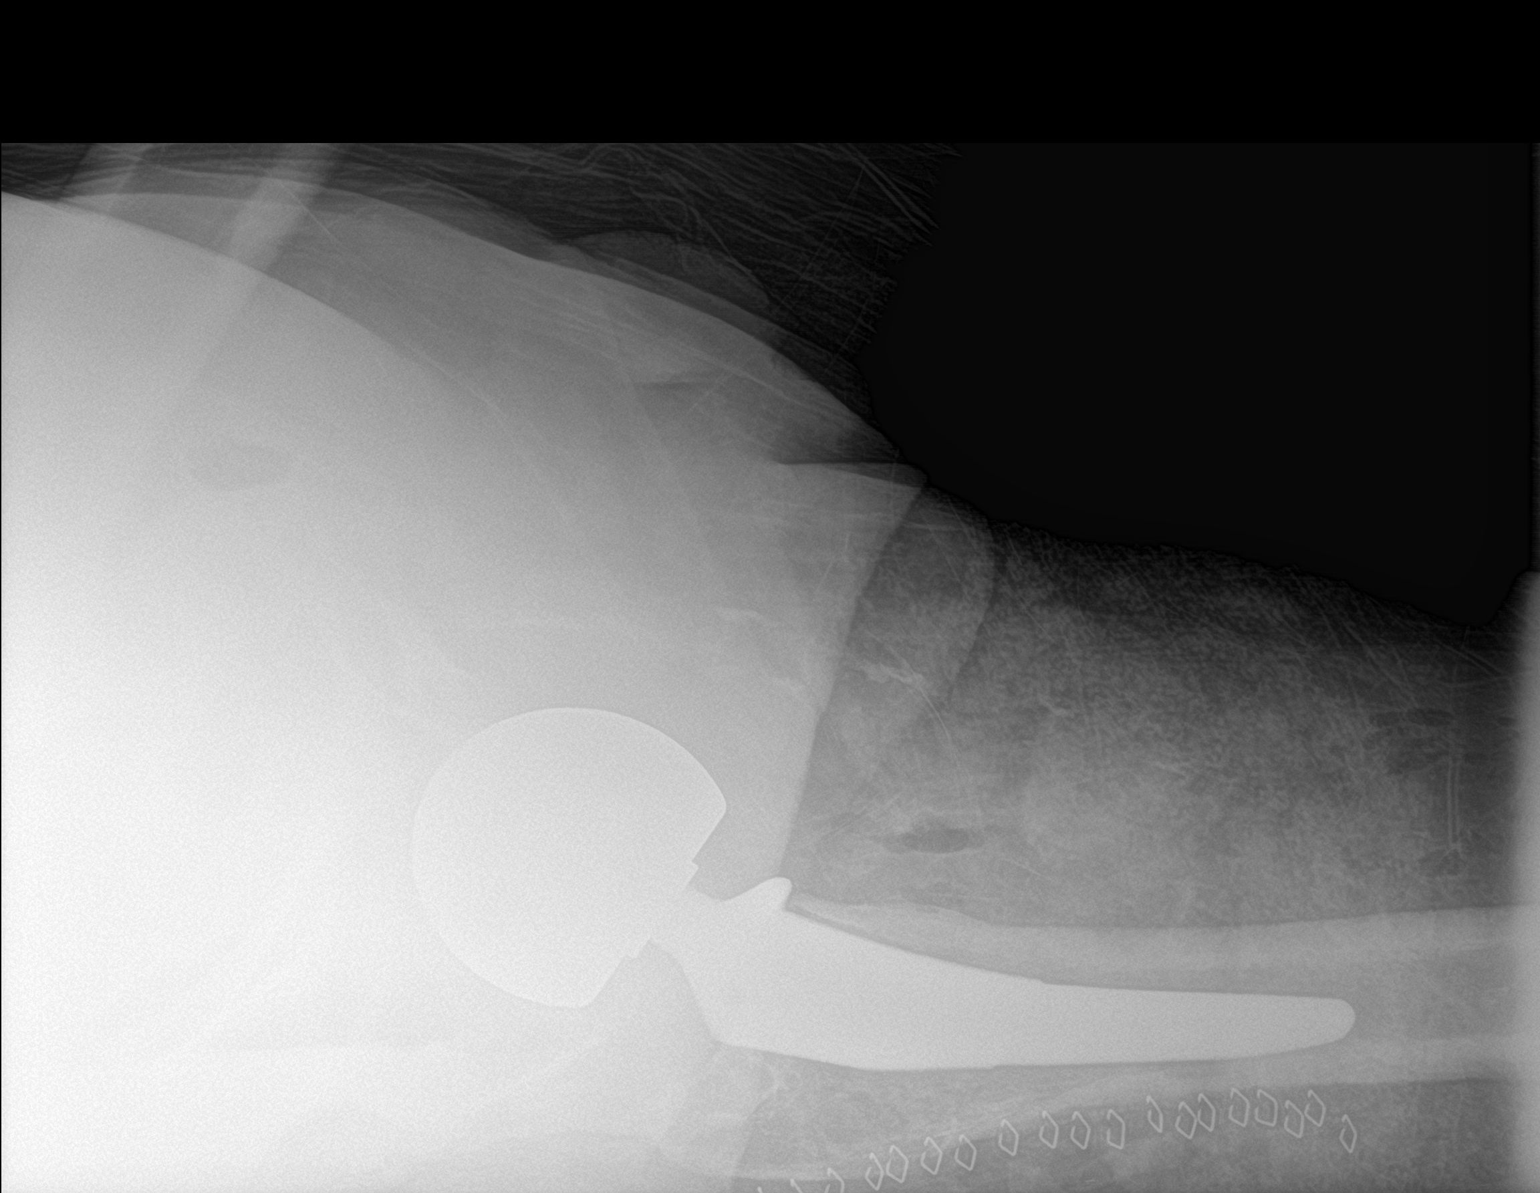

[3 of 3 positions shown; findings below may reference images not displayed]

FINDINGS: Proximal left femoral arthroplasty appears stable and normally
located within the acetabulum. Postoperative soft tissue gas and
skin staples persist along the left lateral thigh. The remaining
visible left femur appears intact. Pelvis appears stable and intact.
Stable and grossly intact proximal right femur. Negative visible
bowel gas. Calcified iliofemoral atherosclerosis.
IMPRESSION: Stable recent left hip hemiarthroplasty. No new fracture or
dislocation identified.

## 2021-01-11 IMAGING — CT CT HEAD W/O CM
4 series · 17 of 47 positions shown, 19 images · non-contrast
Comparison: Brain MRI [DATE].  Head CT [DATE].

CLINICAL DATA: 84-year-old male status recently discharged after
being treated for a fall which resulted in left hip fracture. Repeat
injury today.

EXAM:
CT HEAD WITHOUT CONTRAST
TECHNIQUE: Contiguous axial images were obtained from the base of the skull
through the vertex without intravenous contrast.

[Series 3: head without · axial · non-contrast · 0.44mm/px · z∈[-95,+30]mm · 7 of 35 slices shown, 9 images]
[im 5/35  brain]
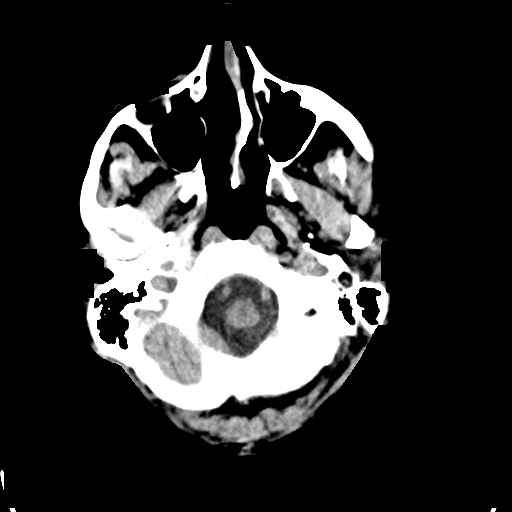
[im 5/35  bone]
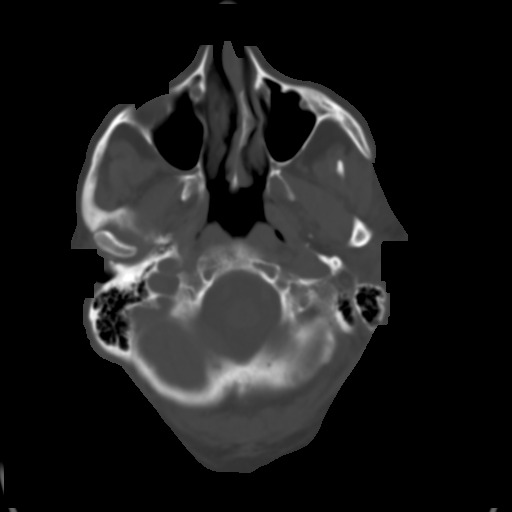
[im 9/35  brain]
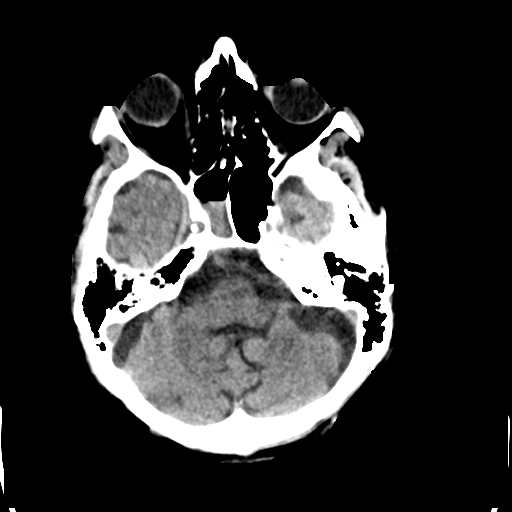
[im 13/35  brain]
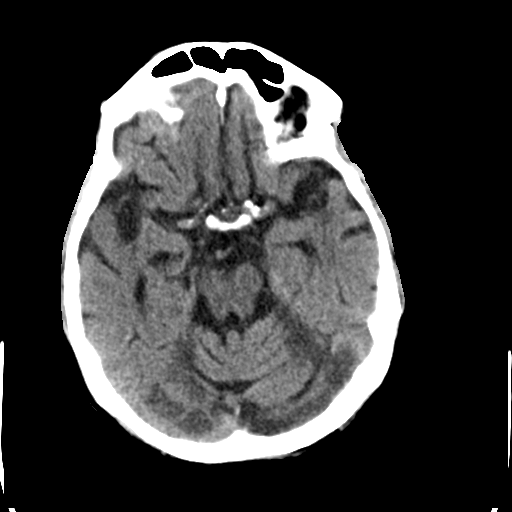
[im 18/35  brain]
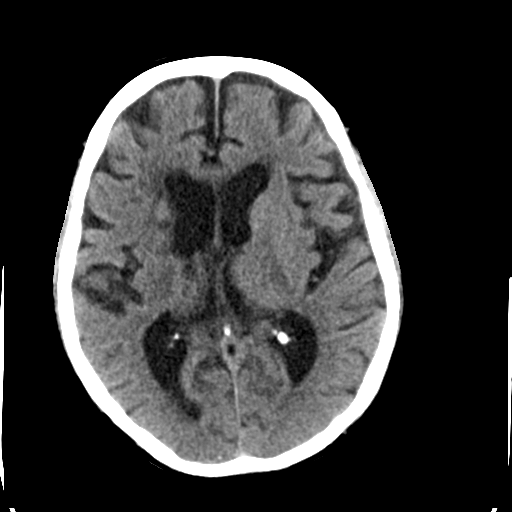
[im 22/35  brain]
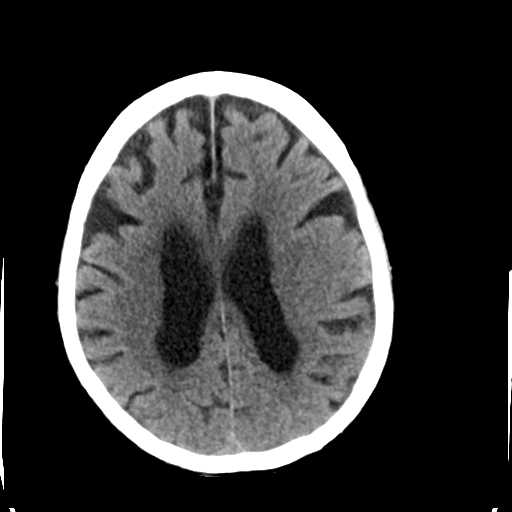
[im 22/35  bone]
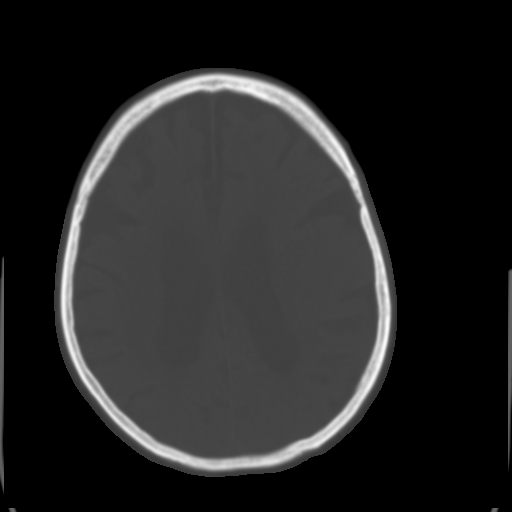
[im 26/35  brain]
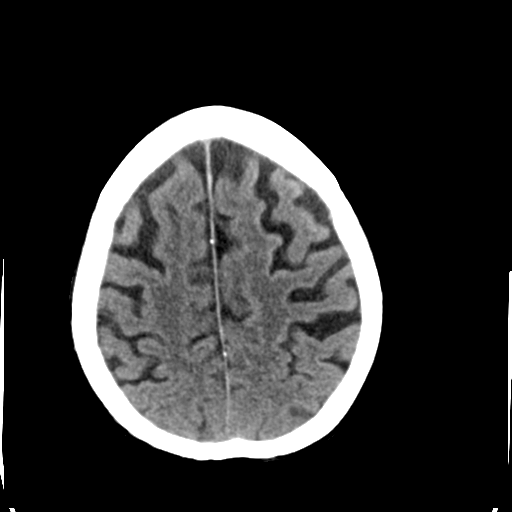
[im 30/35  brain]
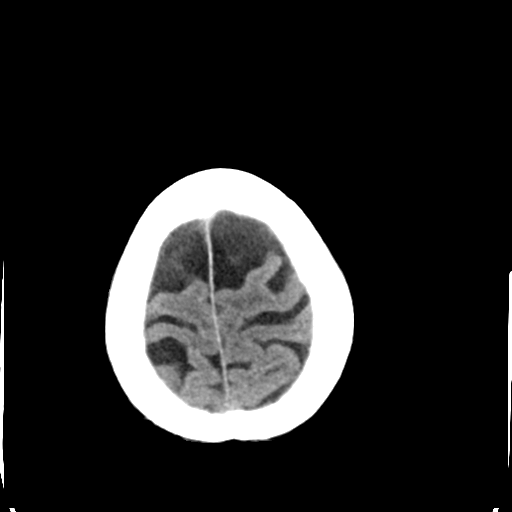

[Series 4: head bone · axial · 0.44mm/px · z∈[-99,-39]mm · 4 of 86 slices shown]
[im 9/86  bone]
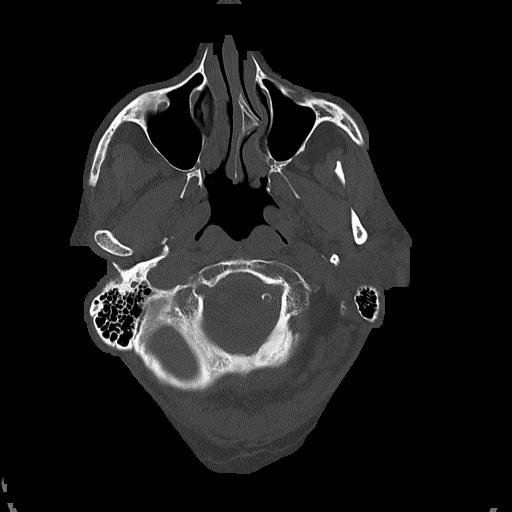
[im 18/86  bone]
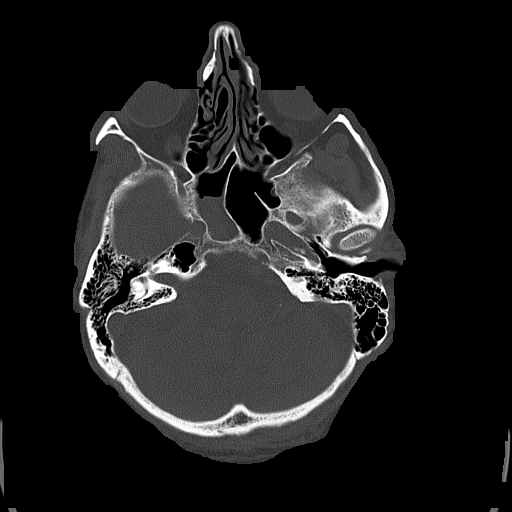
[im 26/86  bone]
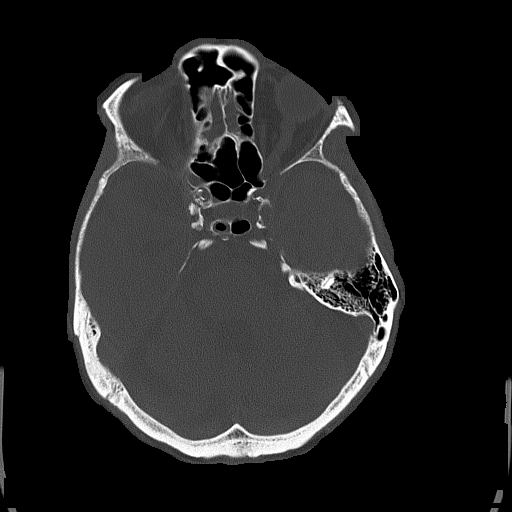
[im 39/86  bone]
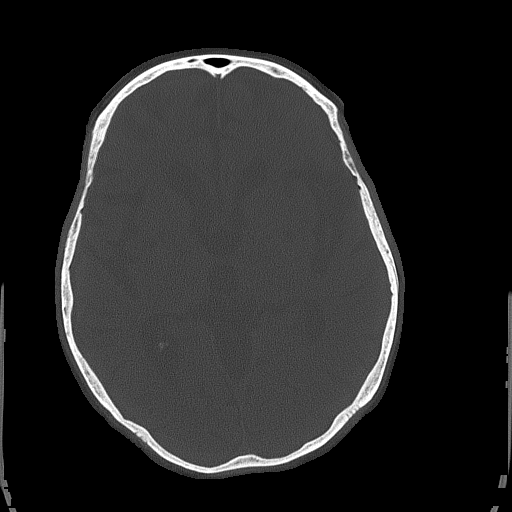

[Series 5: head without cor · coronal · non-contrast · 0.34mm/px · 3 of 61 slices shown]
[im 21/61  brain]
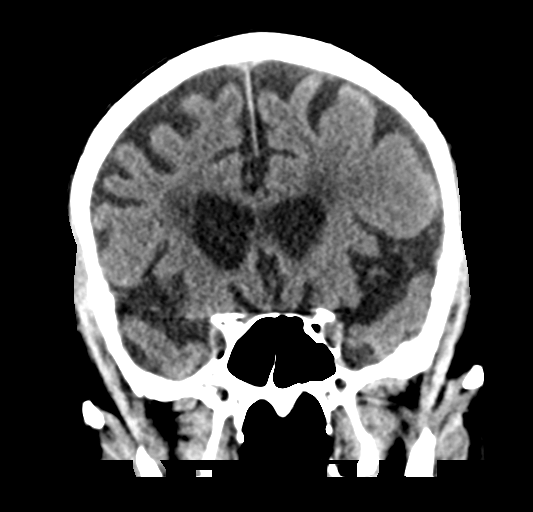
[im 27/61  brain]
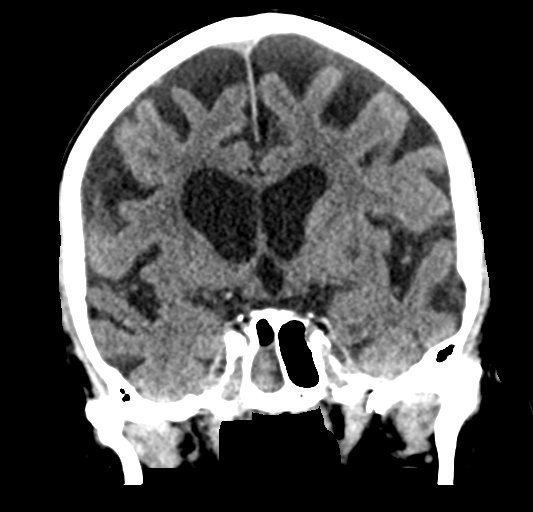
[im 34/61  brain]
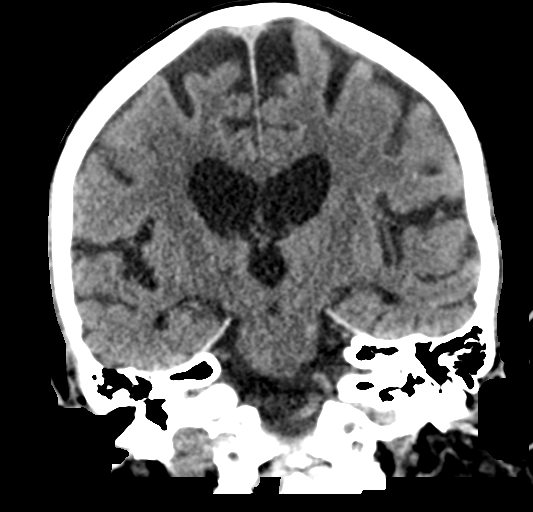

[Series 6: head without sag · sagittal · non-contrast · 0.38mm/px · 3 of 49 slices shown]
[im 17/49  brain]
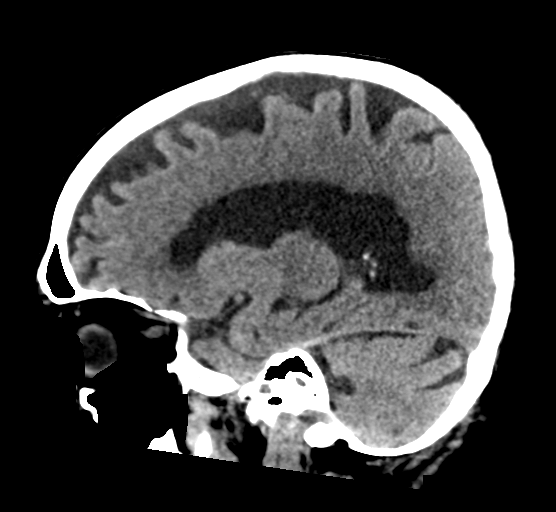
[im 25/49  brain]
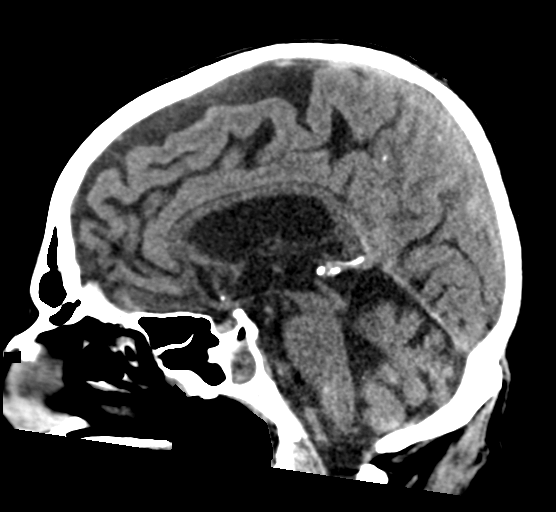
[im 33/49  brain]
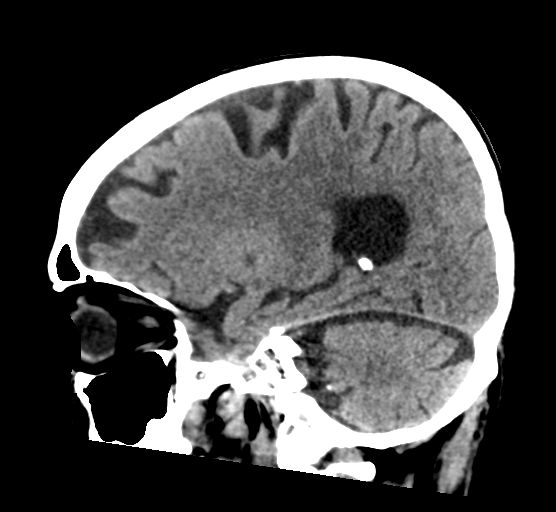

[17 of 47 positions shown; findings below may reference images not displayed]

FINDINGS: Brain: Advanced chronic small vessel disease. Chronic bilateral
basal ganglia and right thalamic lacunar infarcts with stable ex
vacuo enlargement of the right lateral ventricle. Patchy additional
bilateral white matter hypodensity.

No midline shift, ventriculomegaly, mass effect, evidence of mass
lesion, intracranial hemorrhage or evidence of cortically based
acute infarction.

Vascular: Extensive Calcified atherosclerosis at the skull base. No
suspicious intracranial vascular hyperdensity. 6

Skull: Stable. No acute osseous abnormality identified.

Sinuses/Orbits: Low-density fluid level in the right sphenoid sinus
has increased since [REDACTED]. But other Visualized paranasal sinuses and
mastoids are clear. This appears inflammatory.

Other: No acute orbit or scalp soft tissue injury identified.
IMPRESSION: 1. No acute intracranial abnormality or acute traumatic injury
identified.
2. Stable CT appearance of advanced chronic small vessel disease.
3. Acute right sphenoid sinusitis.

## 2021-01-11 MED ORDER — FENTANYL CITRATE PF 50 MCG/ML IJ SOSY
100.0000 ug | PREFILLED_SYRINGE | Freq: Once | INTRAMUSCULAR | Status: AC
  Administered 2021-01-11: 100 ug via INTRAVENOUS
  Filled 2021-01-11: qty 2

## 2021-01-11 MED ORDER — TRAMADOL HCL 50 MG PO TABS
50.0000 mg | ORAL_TABLET | Freq: Once | ORAL | Status: AC
  Administered 2021-01-11: 50 mg via ORAL
  Filled 2021-01-11: qty 1

## 2021-01-11 MED ORDER — ONDANSETRON HCL 4 MG/2ML IJ SOLN
4.0000 mg | Freq: Once | INTRAMUSCULAR | Status: AC
  Administered 2021-01-11: 4 mg via INTRAVENOUS
  Filled 2021-01-11: qty 2

## 2021-01-11 NOTE — Discharge Instructions (Addendum)
You were evaluated in the Emergency Department and after careful evaluation, we did not find any emergent condition requiring admission or further testing in the hospital.  Your exam/testing today was overall reassuring.  X-ray did not show any broken bones or complications related to the recent surgery.  Please return to the Emergency Department if you experience any worsening of your condition.  Thank you for allowing [place] to be a part of your care.

## 2021-01-11 NOTE — ED Triage Notes (Signed)
Patient BIBEMS from [facility] for fall from bed attempting to reach cookies. Patient discharged couple days ago and broke left hip, patient of surgery. Injured same hip today. Patient had two hydrocodone at 2am prior to leaving facility.

## 2021-01-11 NOTE — ED Notes (Signed)
This RN updated facility

## 2021-01-11 NOTE — ED Notes (Signed)
This RN attempted to call [facility] multiple times.

## 2021-01-11 NOTE — ED Notes (Signed)
Attempted to call [facility] at this time. No answer.

## 2021-01-11 NOTE — ED Provider Notes (Signed)
[facility] Emergency Department Provider Note MRN:  [id number]  Arrival date & time: 01/11/21     Chief Complaint   Fall   History of Present Illness   Jason Hartman is a 85 y.o. year-old male with a history of hypertension, CAD presenting to the ED with chief complaint of fall.  Patient fell this evening onto his left hip.  Endorsing severe pain to this area.  Denies any other injuries.  No head trauma, no loss consciousness, no neck or back pain, no chest pain or shortness of breath, no abdominal pain.  Pain is constant, worse with motion or palpation.  Review of Systems  A complete 10 system review of systems was obtained and all systems are negative except as noted in the HPI and PMH.   Patient's Health History    Past Medical History:  Diagnosis Date   CAD (coronary artery disease)    s/p stents   Cellulitis and abscess of leg, except foot    Hypertension    Obstructive sleep apnea (adult) (pediatric)    Restless legs syndrome (RLS)    Thoracic or lumbosacral neuritis or radiculitis, unspecified    Type II or unspecified type diabetes mellitus without mention of complication, not stated as uncontrolled    Unspecified disease of pericardium    Unspecified hereditary and idiopathic peripheral neuropathy     Past Surgical History:  Procedure Laterality Date   ANAL FISSURE REPAIR     ANTERIOR APPROACH HEMI HIP ARTHROPLASTY Left 12/30/2020   Procedure: ANTERIOR APPROACH HIP ARTHROPLASTY;  Surgeon: Vanetta Mulders, MD;  Location: [facility];  Service: Orthopedics;  Laterality: Left;   pericarditis  2009    Family History  Problem Relation Age of Onset   Diabetes Father     Social History   Socioeconomic History   Marital status: Married    Spouse name: [name]   Number of children: 2   Years of education: College   Highest education level: Not on file  Occupational History   Occupation: Marine scientist: [employer]  Tobacco Use    Smoking status: Former    Types: Cigarettes    Quit date: 09/01/1980    Years since quitting: 40.3   Smokeless tobacco: Never  Vaping Use   Vaping Use: Never used  Substance and Sexual Activity   Alcohol use: Yes    Comment: rare   Drug use: Never   Sexual activity: Not on file  Other Topics Concern   Not on file  Social History Narrative   Patient lives at home with spouse.   Caffeine Use: occasionally   Social Determinants of Health   Financial Resource Strain: Not on file  Food Insecurity: Not on file  Transportation Needs: Not on file  Physical Activity: Not on file  Stress: Not on file  Social Connections: Not on file  Intimate Partner Violence: Not on file     Physical Exam   Vitals:   01/11/21 0352  BP: 107/69  Pulse: 91  Resp: (!) 21  Temp: 98.3 F (36.8 C)  SpO2: 100%    CONSTITUTIONAL: Chronically ill-appearing, NAD NEURO:  Alert and oriented x 3, no focal deficits EYES:  eyes equal and reactive ENT/NECK:  no LAD, no JVD CARDIO: Regular rate, well-perfused, normal S1 and S2 PULM:  CTAB no wheezing or rhonchi GI/GU:  normal bowel sounds, non-distended, non-tender MSK/SPINE:  No gross deformities, no edema, tenderness palpation to the left hip with reduced range  of motion due to pain, neurovascularly intact distally SKIN:  no rash, atraumatic PSYCH:  Appropriate speech and behavior  *Additional and/or pertinent findings included in MDM below  Diagnostic and Interventional Summary    EKG Interpretation  Date/Time:    Ventricular Rate:    PR Interval:    QRS Duration:   QT Interval:    QTC Calculation:   R Axis:     Text Interpretation:         Labs Reviewed  CBC - Abnormal; Notable for the following components:      Result Value   WBC 22.6 (*)    RBC 3.57 (*)    Hemoglobin 10.1 (*)    HCT 32.7 (*)    Platelets 477 (*)    All other components within normal limits  BASIC METABOLIC PANEL - Abnormal; Notable for the following components:    Sodium 131 (*)    Chloride 96 (*)    Glucose, Bld 209 (*)    Calcium 8.1 (*)    All other components within normal limits    CT HEAD WO CONTRAST (5MM)  Final Result    DG Hip Unilat W or Wo Pelvis 2-3 Views Right  Final Result      Medications  fentaNYL (SUBLIMAZE) injection 100 mcg (100 mcg Intravenous Given 01/11/21 0351)  ondansetron (ZOFRAN) injection 4 mg (4 mg Intravenous Given 01/11/21 0353)     Procedures  /  Critical Care Procedures  ED Course and Medical Decision Making  I have reviewed the triage vital signs, the nursing notes, and pertinent available records from the EMR.  Listed above are laboratory and imaging tests that I personally ordered, reviewed, and interpreted and then considered in my medical decision making (see below for details).  Suspect hip fracture versus dislocation, providing pain control and will evaluate with x-ray.  Seems to be an isolated injury.     X-ray is reassuring, no fracture.  Patient had a recent hip replacement, staples are intact, no dehiscence.  Patient is on blood thinners.  CT head is without acute process.  Leukocytosis of unclear significance.  Patient has no fever, no cough, no burning with urination, no suprapubic tenderness, possibly up in the setting of recent trauma.  Appropriate for discharge.   Barth Kirks. Sedonia Small, MD [place] [email or web address]  Final Clinical Impressions(s) / ED Diagnoses     ICD-10-CM   1. Fall, initial encounter  W19.XXXA     2. Hip pain  M25.559       ED Discharge Orders     None        Discharge Instructions Discussed with and Provided to Patient:     Discharge Instructions      You were evaluated in the Emergency Department and after careful evaluation, we did not find any emergent condition requiring admission or further testing in the hospital.  Your exam/testing today was overall reassuring.  X-ray did not show any broken  bones or complications related to the recent surgery.  Please return to the Emergency Department if you experience any worsening of your condition.  Thank you for allowing [place] to be a part of your care.         Maudie Flakes, MD 01/11/21 [id number]

## 2021-01-15 DIAGNOSIS — I480 Paroxysmal atrial fibrillation: Secondary | ICD-10-CM | POA: Diagnosis not present

## 2021-01-15 DIAGNOSIS — I10 Essential (primary) hypertension: Secondary | ICD-10-CM | POA: Diagnosis not present

## 2021-01-15 DIAGNOSIS — I2510 Atherosclerotic heart disease of native coronary artery without angina pectoris: Secondary | ICD-10-CM | POA: Diagnosis not present

## 2021-01-15 DIAGNOSIS — G20 Parkinson's disease: Secondary | ICD-10-CM | POA: Diagnosis not present

## 2021-01-15 DIAGNOSIS — Z4789 Encounter for other orthopedic aftercare: Secondary | ICD-10-CM | POA: Diagnosis not present

## 2021-01-15 DIAGNOSIS — R296 Repeated falls: Secondary | ICD-10-CM | POA: Diagnosis not present

## 2021-01-15 DIAGNOSIS — F4323 Adjustment disorder with mixed anxiety and depressed mood: Secondary | ICD-10-CM | POA: Diagnosis not present

## 2021-01-15 DIAGNOSIS — N400 Enlarged prostate without lower urinary tract symptoms: Secondary | ICD-10-CM | POA: Diagnosis not present

## 2021-01-15 DIAGNOSIS — M9702XD Periprosthetic fracture around internal prosthetic left hip joint, subsequent encounter: Secondary | ICD-10-CM | POA: Diagnosis not present

## 2021-01-15 DIAGNOSIS — F5101 Primary insomnia: Secondary | ICD-10-CM | POA: Diagnosis not present

## 2021-01-18 ENCOUNTER — Ambulatory Visit (INDEPENDENT_AMBULATORY_CARE_PROVIDER_SITE_OTHER): Payer: Medicare Other | Admitting: Orthopaedic Surgery

## 2021-01-18 ENCOUNTER — Other Ambulatory Visit: Payer: Self-pay | Admitting: Geriatric Medicine

## 2021-01-18 ENCOUNTER — Ambulatory Visit (HOSPITAL_COMMUNITY)
Admission: RE | Admit: 2021-01-18 | Discharge: 2021-01-18 | Disposition: A | Discharge status: Home / Self Care | Payer: Medicare Other | Source: Ambulatory Visit | Attending: Geriatric Medicine | Admitting: Geriatric Medicine

## 2021-01-18 ENCOUNTER — Other Ambulatory Visit: Payer: Self-pay

## 2021-01-18 VITALS — Ht 70.0 in | Wt 150.0 lb

## 2021-01-18 DIAGNOSIS — F411 Generalized anxiety disorder: Secondary | ICD-10-CM | POA: Diagnosis not present

## 2021-01-18 DIAGNOSIS — I77810 Thoracic aortic ectasia: Secondary | ICD-10-CM

## 2021-01-18 DIAGNOSIS — Z96649 Presence of unspecified artificial hip joint: Secondary | ICD-10-CM

## 2021-01-18 DIAGNOSIS — L97322 Non-pressure chronic ulcer of left ankle with fat layer exposed: Secondary | ICD-10-CM | POA: Diagnosis not present

## 2021-01-18 DIAGNOSIS — L97522 Non-pressure chronic ulcer of other part of left foot with fat layer exposed: Secondary | ICD-10-CM | POA: Diagnosis not present

## 2021-01-18 DIAGNOSIS — E119 Type 2 diabetes mellitus without complications: Secondary | ICD-10-CM | POA: Diagnosis not present

## 2021-01-18 DIAGNOSIS — S51801A Unspecified open wound of right forearm, initial encounter: Secondary | ICD-10-CM | POA: Diagnosis not present

## 2021-01-18 NOTE — Progress Notes (Signed)
Post Operative Evaluation    Procedure/Date of Surgery: Left hip hemiarthroplasty done on December 30, 2020  Interval History:  85 year old male status post left hip hemiarthroplasty 2 weeks prior.  He was doing well and able to walk up to a distances of 100 feet at his rehab.  He states that unfortunately he had a fall on October 6 where he fell out of bed at the side rails were not up on his bed.  Since that time he is having difficulty mobilizing.  He reportedly has been started on antibiotics for what they report as infection of the hip.  He continues to be at rehab.   PMH/PSH/Family History/Social History/Meds/Allergies:    Past Medical History:  Diagnosis Date   CAD (coronary artery disease)    s/p stents   Cellulitis and abscess of leg, except foot    Hypertension    Obstructive sleep apnea (adult) (pediatric)    Restless legs syndrome (RLS)    Thoracic or lumbosacral neuritis or radiculitis, unspecified    Type II or unspecified type diabetes mellitus without mention of complication, not stated as uncontrolled    Unspecified disease of pericardium    Unspecified hereditary and idiopathic peripheral neuropathy    Past Surgical History:  Procedure Laterality Date   ANAL FISSURE REPAIR     ANTERIOR APPROACH HEMI HIP ARTHROPLASTY Left 12/30/2020   Procedure: ANTERIOR APPROACH HIP ARTHROPLASTY;  Surgeon: Vanetta Mulders, MD;  Location: [place];  Service: Orthopedics;  Laterality: Left;   pericarditis  2009   Social History   Socioeconomic History   Marital status: Married    Spouse name: [name]   Number of children: 2   Years of education: College   Highest education level: Not on file  Occupational History   Occupation: Marine scientist: [employer]  Tobacco Use   Smoking status: Former    Types: Cigarettes    Quit date: 09/01/1980    Years since quitting: 40.4   Smokeless tobacco: Never  Vaping Use   Vaping Use: Never  used  Substance and Sexual Activity   Alcohol use: Yes    Comment: rare   Drug use: Never   Sexual activity: Not on file  Other Topics Concern   Not on file  Social History Narrative   Patient lives at home with spouse.   Caffeine Use: occasionally   Social Determinants of Health   Financial Resource Strain: Not on file  Food Insecurity: Not on file  Transportation Needs: Not on file  Physical Activity: Not on file  Stress: Not on file  Social Connections: Not on file   Family History  Problem Relation Age of Onset   Diabetes Father    Allergies  Allergen Reactions   Oxycodone Anaphylaxis   Iodinated Diagnostic Agents Hives    alleric to renografin,isovue & omnipaque, hives, requires 13 hr prep//a.[email or web address], Onset Date: [id number]      Iodine Hives and Rash    alleric to renografin,isovue & omnipaque, hives, requires 13 hr prep//a.[email or web address], Onset Date: [id number] allergic to renografin,isovue & omnipaque, hives, requires 13 hr prep//a.[email or web address], Onset Date: [id number]   Linezolid Hives and Rash        Methadone Hcl Other (See Comments)    hallucinations    Promethazine Other (See Comments)    Delirium (pulled  out IV), erratic behavior Mental status change     Isovue [Iopamidol] Hives   Morphine Sulfate Other (See Comments)    Unknown reaction   Omnipaque [Iohexol] Hives    Code: HIVES, Desc: alleric to renografin,isovue & omnipaque, hives, requires 13 hr prep//a.[email or web address], Onset Date: [id number]    Quetiapine Other (See Comments)    Unknown reaction   Renografin [Diatrizoate] Hives   Statins Other (See Comments)    Muscle weakness   Zolpidem Other (See Comments)    Erratic behavior/delusions Altered mental status   Atorvastatin Itching and Other (See Comments)    Tired, weakness    Carbidopa-Levodopa Anxiety   Chlorhexidine Gluconate [Chlorhexidine] Hives and Rash   Clindamycin Rash   Colesevelam Other (See Comments)    tired    Doxazosin Rash   Lovastatin  Other (See Comments)    Tired, nervousness    Rosuvastatin Other (See Comments)    Tired, weakness    Current Outpatient Medications  Medication Sig Dispense Refill   acetaminophen (TYLENOL) 500 MG tablet Take 1,000 mg by mouth every 8 (eight) hours.     Acidophilus Lactobacillus CAPS Take 1 capsule by mouth every evening.     amiodarone (PACERONE) 200 MG tablet Take 1 tablet (200 mg total) by mouth daily.     apixaban (ELIQUIS) 5 MG TABS tablet Take 1 tablet (5 mg total) by mouth 2 (two) times daily. (Patient not taking: Reported on 12/29/2020) 60 tablet    clonazePAM (KLONOPIN) 0.25 MG disintegrating tablet Take 1 tablet (0.25 mg total) by mouth every morning. 30 tablet 0   clonazePAM (KLONOPIN) 0.5 MG tablet Take 1 tablet (0.5 mg total) by mouth at bedtime. 30 tablet 0   famotidine (PEPCID) 20 MG tablet Take 20 mg by mouth every morning.     ferrous sulfate 325 (65 FE) MG EC tablet Take 325 mg by mouth every morning.     gabapentin (NEURONTIN) 300 MG capsule Take 1 capsule (300 mg total) by mouth at bedtime.     HYDROcodone-acetaminophen (NORCO/VICODIN) 5-325 MG tablet Take 1-2 tablets by mouth every 4 (four) hours as needed for severe pain. 30 tablet 0   LANTUS SOLOSTAR 100 UNIT/ML Solostar Pen Inject 8 Units into the skin 2 (two) times daily. 15 mL 11   melatonin 5 MG TABS Take 5 mg by mouth at bedtime.     metFORMIN (GLUCOPHAGE-XR) 500 MG 24 hr tablet Take 500 mg by mouth daily with breakfast.     methocarbamol (ROBAXIN) 500 MG tablet Take 1 tablet (500 mg total) by mouth every 6 (six) hours as needed for muscle spasms. 30 tablet 0   metoprolol tartrate (LOPRESSOR) 25 MG tablet Take 0.5 tablets (12.5 mg total) by mouth 2 (two) times daily.     NYAMYC powder Apply 1 application topically in the morning and at bedtime. To rash on back and groin and sacrum     OLANZapine (ZYPREXA) 2.5 MG tablet Take 1 tablet (2.5 mg total) by mouth at bedtime.     OLANZapine (ZYPREXA) 2.5 MG tablet Take 1  tablet (2.5 mg total) by mouth 2 (two) times daily as needed (agitation threatening pt or staff safety, hallucinations).     polyethylene glycol powder (GLYCOLAX/MIRALAX) 17 GM/SCOOP powder Take 17 g by mouth every morning. Mix in 8 oz water and drink     Rotigotine (NEUPRO) 8 MG/24HR PT24 Place 1 patch onto the skin at bedtime. For Parkinson's     Semaglutide,0.25 or 0.5MG /DOS, (OZEMPIC, 0.25 OR  0.5 MG/DOSE,) 2 MG/1.5ML SOPN Inject 0.5 mg into the skin every Tuesday.     senna-docusate (SENOKOT-S) 8.6-50 MG tablet Take 1 tablet by mouth 2 (two) times daily as needed for moderate constipation.     thiamine 100 MG tablet Take 1 tablet (100 mg total) by mouth daily.     traMADol (ULTRAM) 50 MG tablet Take 1.5 tablets (75 mg total) by mouth 2 (two) times daily. 30 tablet 0   No current facility-administered medications for this visit.   No results found.  Review of Systems:   A ROS was performed including pertinent positives and negatives as documented in the HPI.   Musculoskeletal Exam:    Height 5\' 10"  (1.778 m), weight 150 lb (68 kg).  Left hip incision is macerated and there is serous and drainage on the bandage.  He does have some induration around the wound although there is no frank erythema or purulence.  Staples are intact.  Sensation is intact in all distributions of the leg distally.  He is able to dorsiflex and plantarflex of the foot.  2+ DP pulse.  Imaging:   X-ray 2 views left hip, AP pelvis Intact left hip hemiarthroplasty without evidence of complication  I personally reviewed and interpreted the radiographs.   Assessment:   85 year old male status post left hip hemiarthroplasty.  He now has drainage after a direct contusion to the side of the hip.  I explained that this is likely consistent with fat necrosis direct fall on the hip.  He did not disrupt any of his hardware.  We do need to keep a direct eye on the wound and I would like to closely monitor this for evidence  of infection.  I do think that there is not active infection although I do believe that starting on Keflex to be per cautious is a good idea.  He will continue on Keflex while at his rehab.  I would still like him to be up and mobilizing to a minimum of chair daily.  100 I believe that anything is disrupted at this time.  Plan :    -Follow-up in 1 week for repeat wound check    I personally saw and evaluated the patient, and participated in the management and treatment plan.  Vanetta Mulders, MD Attending Physician, Orthopedic Surgery  This document was dictated using Dragon voice recognition software. A reasonable attempt at proof reading has been made to minimize errors.

## 2021-01-19 ENCOUNTER — Telehealth: Payer: Self-pay

## 2021-01-19 DIAGNOSIS — R296 Repeated falls: Secondary | ICD-10-CM | POA: Diagnosis not present

## 2021-01-19 DIAGNOSIS — I484 Atypical atrial flutter: Secondary | ICD-10-CM | POA: Diagnosis not present

## 2021-01-19 DIAGNOSIS — T8142XA Infection following a procedure, deep incisional surgical site, initial encounter: Secondary | ICD-10-CM | POA: Diagnosis not present

## 2021-01-19 DIAGNOSIS — N401 Enlarged prostate with lower urinary tract symptoms: Secondary | ICD-10-CM | POA: Diagnosis not present

## 2021-01-19 DIAGNOSIS — I10 Essential (primary) hypertension: Secondary | ICD-10-CM | POA: Diagnosis not present

## 2021-01-19 DIAGNOSIS — I2510 Atherosclerotic heart disease of native coronary artery without angina pectoris: Secondary | ICD-10-CM | POA: Diagnosis not present

## 2021-01-19 DIAGNOSIS — G20 Parkinson's disease: Secondary | ICD-10-CM | POA: Diagnosis not present

## 2021-01-19 NOTE — Telephone Encounter (Signed)
Mendel Ryder, NP with [facility] called stating that patient started Keflex 2 days ago due to having suspected infection in his left hip incision.  Stated that patient is having increased drainage. Would like a CB to discuss.  Cb# [phone].  Please advise. Thank you.

## 2021-01-22 DIAGNOSIS — F4323 Adjustment disorder with mixed anxiety and depressed mood: Secondary | ICD-10-CM | POA: Diagnosis not present

## 2021-01-22 DIAGNOSIS — F5101 Primary insomnia: Secondary | ICD-10-CM | POA: Diagnosis not present

## 2021-01-22 DIAGNOSIS — F411 Generalized anxiety disorder: Secondary | ICD-10-CM | POA: Diagnosis not present

## 2021-01-22 DIAGNOSIS — G20 Parkinson's disease: Secondary | ICD-10-CM | POA: Diagnosis not present

## 2021-01-25 ENCOUNTER — Ambulatory Visit (INDEPENDENT_AMBULATORY_CARE_PROVIDER_SITE_OTHER): Payer: Medicare Other | Admitting: Orthopaedic Surgery

## 2021-01-25 ENCOUNTER — Telehealth: Payer: Self-pay

## 2021-01-25 ENCOUNTER — Other Ambulatory Visit: Payer: Self-pay

## 2021-01-25 ENCOUNTER — Encounter (HOSPITAL_COMMUNITY): Payer: Self-pay | Admitting: Orthopaedic Surgery

## 2021-01-25 ENCOUNTER — Ambulatory Visit (HOSPITAL_BASED_OUTPATIENT_CLINIC_OR_DEPARTMENT_OTHER): Payer: Self-pay | Admitting: Orthopaedic Surgery

## 2021-01-25 VITALS — Ht 70.0 in | Wt 150.0 lb

## 2021-01-25 DIAGNOSIS — L97522 Non-pressure chronic ulcer of other part of left foot with fat layer exposed: Secondary | ICD-10-CM | POA: Diagnosis not present

## 2021-01-25 DIAGNOSIS — F411 Generalized anxiety disorder: Secondary | ICD-10-CM | POA: Diagnosis not present

## 2021-01-25 DIAGNOSIS — I714 Abdominal aortic aneurysm, without rupture, unspecified: Secondary | ICD-10-CM | POA: Diagnosis not present

## 2021-01-25 DIAGNOSIS — S51801A Unspecified open wound of right forearm, initial encounter: Secondary | ICD-10-CM | POA: Diagnosis not present

## 2021-01-25 DIAGNOSIS — R4182 Altered mental status, unspecified: Secondary | ICD-10-CM | POA: Diagnosis not present

## 2021-01-25 DIAGNOSIS — S79929A Unspecified injury of unspecified thigh, initial encounter: Secondary | ICD-10-CM | POA: Diagnosis not present

## 2021-01-25 DIAGNOSIS — Z96649 Presence of unspecified artificial hip joint: Secondary | ICD-10-CM

## 2021-01-25 DIAGNOSIS — R278 Other lack of coordination: Secondary | ICD-10-CM | POA: Diagnosis not present

## 2021-01-25 DIAGNOSIS — L97322 Non-pressure chronic ulcer of left ankle with fat layer exposed: Secondary | ICD-10-CM | POA: Diagnosis not present

## 2021-01-25 DIAGNOSIS — R41841 Cognitive communication deficit: Secondary | ICD-10-CM | POA: Diagnosis not present

## 2021-01-25 DIAGNOSIS — T8452XD Infection and inflammatory reaction due to internal left hip prosthesis, subsequent encounter: Secondary | ICD-10-CM | POA: Diagnosis not present

## 2021-01-25 DIAGNOSIS — Z9181 History of falling: Secondary | ICD-10-CM | POA: Diagnosis not present

## 2021-01-25 DIAGNOSIS — Z7401 Bed confinement status: Secondary | ICD-10-CM | POA: Diagnosis not present

## 2021-01-25 DIAGNOSIS — R2681 Unsteadiness on feet: Secondary | ICD-10-CM | POA: Diagnosis not present

## 2021-01-25 DIAGNOSIS — S72002D Fracture of unspecified part of neck of left femur, subsequent encounter for closed fracture with routine healing: Secondary | ICD-10-CM | POA: Diagnosis not present

## 2021-01-25 DIAGNOSIS — M6281 Muscle weakness (generalized): Secondary | ICD-10-CM | POA: Diagnosis not present

## 2021-01-25 DIAGNOSIS — L03116 Cellulitis of left lower limb: Secondary | ICD-10-CM | POA: Diagnosis not present

## 2021-01-25 NOTE — Progress Notes (Signed)
Post Operative Evaluation    Procedure/Date of Surgery: Left hip hemiarthroplasty done on December 30, 2020  Interval History:   01/25/2021: Presents today for additional follow-up status post hip hemiarthroplasty.  Unfortunately does continue to have drainage.  The drainage appears to be liquefied fat.  This is consistent with a direct contusion onto the side when he had a fall.  Denies any fevers or chills or night sweats.  He has been afebrile at his nursing home  85 year old male status post left hip hemiarthroplasty 2 weeks prior.  He was doing well and able to walk up to a distances of 100 feet at his rehab.  He states that unfortunately he had a fall on October 6 where he fell out of bed at the side rails were not up on his bed.  Since that time he is having difficulty mobilizing.  He reportedly has been started on antibiotics for what they report as infection of the hip.  He continues to be at rehab.   PMH/PSH/Family History/Social History/Meds/Allergies:    Past Medical History:  Diagnosis Date   CAD (coronary artery disease)    s/p stents   Cellulitis and abscess of leg, except foot    Hypertension    Obstructive sleep apnea (adult) (pediatric)    Restless legs syndrome (RLS)    Thoracic or lumbosacral neuritis or radiculitis, unspecified    Type II or unspecified type diabetes mellitus without mention of complication, not stated as uncontrolled    Unspecified disease of pericardium    Unspecified hereditary and idiopathic peripheral neuropathy    Past Surgical History:  Procedure Laterality Date   ANAL FISSURE REPAIR     ANTERIOR APPROACH HEMI HIP ARTHROPLASTY Left 12/30/2020   Procedure: ANTERIOR APPROACH HIP ARTHROPLASTY;  Surgeon: Vanetta Mulders, MD;  Location: [place];  Service: Orthopedics;  Laterality: Left;   pericarditis  2009   Social History   Socioeconomic History   Marital status: Married    Spouse name: [name]    Number of children: 2   Years of education: College   Highest education level: Not on file  Occupational History   Occupation: Marine scientist: [employer]  Tobacco Use   Smoking status: Former    Types: Cigarettes    Quit date: 09/01/1980    Years since quitting: 40.4   Smokeless tobacco: Never  Vaping Use   Vaping Use: Never used  Substance and Sexual Activity   Alcohol use: Yes    Comment: rare   Drug use: Never   Sexual activity: Not on file  Other Topics Concern   Not on file  Social History Narrative   Patient lives at home with spouse.   Caffeine Use: occasionally   Social Determinants of Health   Financial Resource Strain: Not on file  Food Insecurity: Not on file  Transportation Needs: Not on file  Physical Activity: Not on file  Stress: Not on file  Social Connections: Not on file   Family History  Problem Relation Age of Onset   Diabetes Father    Allergies  Allergen Reactions   Oxycodone Anaphylaxis   Iodinated Diagnostic Agents Hives    alleric to renografin,isovue & omnipaque, hives, requires 13 hr prep//a.calhoun, Onset Date: [id number]      Iodine Hives and Rash  alleric to renografin,isovue & omnipaque, hives, requires 13 hr prep//a.calhoun, Onset Date: [id number] allergic to renografin,isovue & omnipaque, hives, requires 13 hr prep//a.calhoun, Onset Date: [id number]   Linezolid Hives and Rash        Methadone Hcl Other (See Comments)    hallucinations    Promethazine Other (See Comments)    Delirium (pulled out IV), erratic behavior Mental status change     Isovue [Iopamidol] Hives   Morphine Sulfate Other (See Comments)    Unknown reaction   Omnipaque [Iohexol] Hives    Code: HIVES, Desc: alleric to renografin,isovue & omnipaque, hives, requires 13 hr prep//a.calhoun, Onset Date: [id number]    Quetiapine Other (See Comments)    Unknown reaction   Renografin [Diatrizoate] Hives   Statins Other (See Comments)    Muscle weakness    Zolpidem Other (See Comments)    Erratic behavior/delusions Altered mental status   Atorvastatin Itching and Other (See Comments)    Tired, weakness    Carbidopa-Levodopa Anxiety   Chlorhexidine Gluconate [Chlorhexidine] Hives and Rash   Clindamycin Rash   Colesevelam Other (See Comments)    tired    Doxazosin Rash   Lovastatin Other (See Comments)    Tired, nervousness    Rosuvastatin Other (See Comments)    Tired, weakness    Current Outpatient Medications  Medication Sig Dispense Refill   acetaminophen (TYLENOL) 500 MG tablet Take 1,000 mg by mouth every 8 (eight) hours.     Acidophilus Lactobacillus CAPS Take 1 capsule by mouth every evening.     amiodarone (PACERONE) 200 MG tablet Take 1 tablet (200 mg total) by mouth daily.     apixaban (ELIQUIS) 5 MG TABS tablet Take 1 tablet (5 mg total) by mouth 2 (two) times daily. (Patient not taking: Reported on 12/29/2020) 60 tablet    clonazePAM (KLONOPIN) 0.25 MG disintegrating tablet Take 1 tablet (0.25 mg total) by mouth every morning. 30 tablet 0   clonazePAM (KLONOPIN) 0.5 MG tablet Take 1 tablet (0.5 mg total) by mouth at bedtime. 30 tablet 0   famotidine (PEPCID) 20 MG tablet Take 20 mg by mouth every morning.     ferrous sulfate 325 (65 FE) MG EC tablet Take 325 mg by mouth every morning.     gabapentin (NEURONTIN) 300 MG capsule Take 1 capsule (300 mg total) by mouth at bedtime.     HYDROcodone-acetaminophen (NORCO/VICODIN) 5-325 MG tablet Take 1-2 tablets by mouth every 4 (four) hours as needed for severe pain. 30 tablet 0   LANTUS SOLOSTAR 100 UNIT/ML Solostar Pen Inject 8 Units into the skin 2 (two) times daily. 15 mL 11   melatonin 5 MG TABS Take 5 mg by mouth at bedtime.     metFORMIN (GLUCOPHAGE-XR) 500 MG 24 hr tablet Take 500 mg by mouth daily with breakfast.     methocarbamol (ROBAXIN) 500 MG tablet Take 1 tablet (500 mg total) by mouth every 6 (six) hours as needed for muscle spasms. 30 tablet 0   metoprolol  tartrate (LOPRESSOR) 25 MG tablet Take 0.5 tablets (12.5 mg total) by mouth 2 (two) times daily.     NYAMYC powder Apply 1 application topically in the morning and at bedtime. To rash on back and groin and sacrum     OLANZapine (ZYPREXA) 2.5 MG tablet Take 1 tablet (2.5 mg total) by mouth at bedtime.     OLANZapine (ZYPREXA) 2.5 MG tablet Take 1 tablet (2.5 mg total) by mouth 2 (two) times daily as needed (agitation  threatening pt or staff safety, hallucinations).     polyethylene glycol powder (GLYCOLAX/MIRALAX) 17 GM/SCOOP powder Take 17 g by mouth every morning. Mix in 8 oz water and drink     Rotigotine (NEUPRO) 8 MG/24HR PT24 Place 1 patch onto the skin at bedtime. For Parkinson's     Semaglutide,0.25 or 0.5MG /DOS, (OZEMPIC, 0.25 OR 0.5 MG/DOSE,) 2 MG/1.5ML SOPN Inject 0.5 mg into the skin every Tuesday.     senna-docusate (SENOKOT-S) 8.6-50 MG tablet Take 1 tablet by mouth 2 (two) times daily as needed for moderate constipation.     thiamine 100 MG tablet Take 1 tablet (100 mg total) by mouth daily.     traMADol (ULTRAM) 50 MG tablet Take 1.5 tablets (75 mg total) by mouth 2 (two) times daily. 30 tablet 0   No current facility-administered medications for this visit.   No results found.  Review of Systems:   A ROS was performed including pertinent positives and negatives as documented in the HPI.   Musculoskeletal Exam:    Height 5\' 10"  (1.778 m), weight 150 lb (68 kg).  Left hip incision is macerated and there is serous and drainage on the bandage.  He does have some induration around the wound although there is no frank erythema or purulence.  The wound drainage appears to be liquefied fat which is coming from the central third approximately couple millimeters of the wound.   Sensation is intact in all distributions of the leg distally.  He is able to dorsiflex and plantarflex of the foot.  2+ DP pulse.  Imaging:   X-ray 2 views left hip, AP pelvis Intact left hip hemiarthroplasty  without evidence of complication  I personally reviewed and interpreted the radiographs.   Assessment:   85 year old male status post left hip hemiarthroplasty.  He now has drainage after a direct contusion to the side of the hip.  I explained that this is likely consistent with fat necrosis direct fall on the hip.  At this time given the fact that he does have a metal implant, I would like to be aggressive and treat him with an irrigation debridement with wound VAC placement.  I do not believe there is frank infection at this time although I would like to be proactive to help his wound heal as good as possible without involving the underlying hardware.  Plan :    -Plan for left hip irrigation debridement with wound VAC placement   After a lengthy discussion of treatment options, including risks, benefits, alternatives, complications of surgical and nonsurgical conservative options, the patient elected surgical repair.   The patient  is aware of the material risks  and complications including, but not limited to injury to adjacent structures, neurovascular injury, infection, numbness, bleeding, implant failure, thermal burns, stiffness, persistent pain, failure to heal, disease transmission from allograft, need for further surgery, dislocation, anesthetic risks, blood clots, risks of death,and others. The probabilities of surgical success and failure discussed with patient given their particular co-morbidities.The time and nature of expected rehabilitation and recovery was discussed.The patient's questions were all answered preoperatively.  No barriers to understanding were noted. I explained the natural history of the disease process and Rx rationale.  I explained to the patient what I considered to be reasonable expectations given their personal situation.  The final treatment plan was arrived at through a shared patient decision making process model.     I personally saw and evaluated the  patient, and participated in the management and  treatment plan.  Vanetta Mulders, MD Attending Physician, Orthopedic Surgery  This document was dictated using Dragon voice recognition software. A reasonable attempt at proof reading has been made to minimize errors.

## 2021-01-25 NOTE — H&P (View-Only) (Signed)
Post Operative Evaluation    Procedure/Date of Surgery: Left hip hemiarthroplasty done on December 30, 2020  Interval History:   01/25/2021: Presents today for additional follow-up status post hip hemiarthroplasty.  Unfortunately does continue to have drainage.  The drainage appears to be liquefied fat.  This is consistent with a direct contusion onto the side when he had a fall.  Denies any fevers or chills or night sweats.  He has been afebrile at his nursing home  85 year old male status post left hip hemiarthroplasty 2 weeks prior.  He was doing well and able to walk up to a distances of 100 feet at his rehab.  He states that unfortunately he had a fall on October 6 where he fell out of bed at the side rails were not up on his bed.  Since that time he is having difficulty mobilizing.  He reportedly has been started on antibiotics for what they report as infection of the hip.  He continues to be at rehab.   PMH/PSH/Family History/Social History/Meds/Allergies:    Past Medical History:  Diagnosis Date   CAD (coronary artery disease)    s/p stents   Cellulitis and abscess of leg, except foot    Hypertension    Obstructive sleep apnea (adult) (pediatric)    Restless legs syndrome (RLS)    Thoracic or lumbosacral neuritis or radiculitis, unspecified    Type II or unspecified type diabetes mellitus without mention of complication, not stated as uncontrolled    Unspecified disease of pericardium    Unspecified hereditary and idiopathic peripheral neuropathy    Past Surgical History:  Procedure Laterality Date   ANAL FISSURE REPAIR     ANTERIOR APPROACH HEMI HIP ARTHROPLASTY Left 12/30/2020   Procedure: ANTERIOR APPROACH HIP ARTHROPLASTY;  Surgeon: Vanetta Mulders, MD;  Location: [place];  Service: Orthopedics;  Laterality: Left;   pericarditis  2009   Social History   Socioeconomic History   Marital status: Married    Spouse name: [name]    Number of children: 2   Years of education: College   Highest education level: Not on file  Occupational History   Occupation: Marine scientist: [employer]  Tobacco Use   Smoking status: Former    Types: Cigarettes    Quit date: 09/01/1980    Years since quitting: 40.4   Smokeless tobacco: Never  Vaping Use   Vaping Use: Never used  Substance and Sexual Activity   Alcohol use: Yes    Comment: rare   Drug use: Never   Sexual activity: Not on file  Other Topics Concern   Not on file  Social History Narrative   Patient lives at home with spouse.   Caffeine Use: occasionally   Social Determinants of Health   Financial Resource Strain: Not on file  Food Insecurity: Not on file  Transportation Needs: Not on file  Physical Activity: Not on file  Stress: Not on file  Social Connections: Not on file   Family History  Problem Relation Age of Onset   Diabetes Father    Allergies  Allergen Reactions   Oxycodone Anaphylaxis   Iodinated Diagnostic Agents Hives    alleric to renografin,isovue & omnipaque, hives, requires 13 hr prep//a.calhoun, Onset Date: [id number]      Iodine Hives and Rash  alleric to renografin,isovue & omnipaque, hives, requires 13 hr prep//a.calhoun, Onset Date: [id number] allergic to renografin,isovue & omnipaque, hives, requires 13 hr prep//a.calhoun, Onset Date: [id number]   Linezolid Hives and Rash        Methadone Hcl Other (See Comments)    hallucinations    Promethazine Other (See Comments)    Delirium (pulled out IV), erratic behavior Mental status change     Isovue [Iopamidol] Hives   Morphine Sulfate Other (See Comments)    Unknown reaction   Omnipaque [Iohexol] Hives    Code: HIVES, Desc: alleric to renografin,isovue & omnipaque, hives, requires 13 hr prep//a.calhoun, Onset Date: [id number]    Quetiapine Other (See Comments)    Unknown reaction   Renografin [Diatrizoate] Hives   Statins Other (See Comments)    Muscle weakness    Zolpidem Other (See Comments)    Erratic behavior/delusions Altered mental status   Atorvastatin Itching and Other (See Comments)    Tired, weakness    Carbidopa-Levodopa Anxiety   Chlorhexidine Gluconate [Chlorhexidine] Hives and Rash   Clindamycin Rash   Colesevelam Other (See Comments)    tired    Doxazosin Rash   Lovastatin Other (See Comments)    Tired, nervousness    Rosuvastatin Other (See Comments)    Tired, weakness    Current Outpatient Medications  Medication Sig Dispense Refill   acetaminophen (TYLENOL) 500 MG tablet Take 1,000 mg by mouth every 8 (eight) hours.     Acidophilus Lactobacillus CAPS Take 1 capsule by mouth every evening.     amiodarone (PACERONE) 200 MG tablet Take 1 tablet (200 mg total) by mouth daily.     apixaban (ELIQUIS) 5 MG TABS tablet Take 1 tablet (5 mg total) by mouth 2 (two) times daily. (Patient not taking: Reported on 12/29/2020) 60 tablet    clonazePAM (KLONOPIN) 0.25 MG disintegrating tablet Take 1 tablet (0.25 mg total) by mouth every morning. 30 tablet 0   clonazePAM (KLONOPIN) 0.5 MG tablet Take 1 tablet (0.5 mg total) by mouth at bedtime. 30 tablet 0   famotidine (PEPCID) 20 MG tablet Take 20 mg by mouth every morning.     ferrous sulfate 325 (65 FE) MG EC tablet Take 325 mg by mouth every morning.     gabapentin (NEURONTIN) 300 MG capsule Take 1 capsule (300 mg total) by mouth at bedtime.     HYDROcodone-acetaminophen (NORCO/VICODIN) 5-325 MG tablet Take 1-2 tablets by mouth every 4 (four) hours as needed for severe pain. 30 tablet 0   LANTUS SOLOSTAR 100 UNIT/ML Solostar Pen Inject 8 Units into the skin 2 (two) times daily. 15 mL 11   melatonin 5 MG TABS Take 5 mg by mouth at bedtime.     metFORMIN (GLUCOPHAGE-XR) 500 MG 24 hr tablet Take 500 mg by mouth daily with breakfast.     methocarbamol (ROBAXIN) 500 MG tablet Take 1 tablet (500 mg total) by mouth every 6 (six) hours as needed for muscle spasms. 30 tablet 0   metoprolol  tartrate (LOPRESSOR) 25 MG tablet Take 0.5 tablets (12.5 mg total) by mouth 2 (two) times daily.     NYAMYC powder Apply 1 application topically in the morning and at bedtime. To rash on back and groin and sacrum     OLANZapine (ZYPREXA) 2.5 MG tablet Take 1 tablet (2.5 mg total) by mouth at bedtime.     OLANZapine (ZYPREXA) 2.5 MG tablet Take 1 tablet (2.5 mg total) by mouth 2 (two) times daily as needed (agitation  threatening pt or staff safety, hallucinations).     polyethylene glycol powder (GLYCOLAX/MIRALAX) 17 GM/SCOOP powder Take 17 g by mouth every morning. Mix in 8 oz water and drink     Rotigotine (NEUPRO) 8 MG/24HR PT24 Place 1 patch onto the skin at bedtime. For Parkinson's     Semaglutide,0.25 or 0.5MG /DOS, (OZEMPIC, 0.25 OR 0.5 MG/DOSE,) 2 MG/1.5ML SOPN Inject 0.5 mg into the skin every Tuesday.     senna-docusate (SENOKOT-S) 8.6-50 MG tablet Take 1 tablet by mouth 2 (two) times daily as needed for moderate constipation.     thiamine 100 MG tablet Take 1 tablet (100 mg total) by mouth daily.     traMADol (ULTRAM) 50 MG tablet Take 1.5 tablets (75 mg total) by mouth 2 (two) times daily. 30 tablet 0   No current facility-administered medications for this visit.   No results found.  Review of Systems:   A ROS was performed including pertinent positives and negatives as documented in the HPI.   Musculoskeletal Exam:    Height 5\' 10"  (1.778 m), weight 150 lb (68 kg).  Left hip incision is macerated and there is serous and drainage on the bandage.  He does have some induration around the wound although there is no frank erythema or purulence.  The wound drainage appears to be liquefied fat which is coming from the central third approximately couple millimeters of the wound.   Sensation is intact in all distributions of the leg distally.  He is able to dorsiflex and plantarflex of the foot.  2+ DP pulse.  Imaging:   X-ray 2 views left hip, AP pelvis Intact left hip hemiarthroplasty  without evidence of complication  I personally reviewed and interpreted the radiographs.   Assessment:   85 year old male status post left hip hemiarthroplasty.  He now has drainage after a direct contusion to the side of the hip.  I explained that this is likely consistent with fat necrosis direct fall on the hip.  At this time given the fact that he does have a metal implant, I would like to be aggressive and treat him with an irrigation debridement with wound VAC placement.  I do not believe there is frank infection at this time although I would like to be proactive to help his wound heal as good as possible without involving the underlying hardware.  Plan :    -Plan for left hip irrigation debridement with wound VAC placement   After a lengthy discussion of treatment options, including risks, benefits, alternatives, complications of surgical and nonsurgical conservative options, the patient elected surgical repair.   The patient  is aware of the material risks  and complications including, but not limited to injury to adjacent structures, neurovascular injury, infection, numbness, bleeding, implant failure, thermal burns, stiffness, persistent pain, failure to heal, disease transmission from allograft, need for further surgery, dislocation, anesthetic risks, blood clots, risks of death,and others. The probabilities of surgical success and failure discussed with patient given their particular co-morbidities.The time and nature of expected rehabilitation and recovery was discussed.The patient's questions were all answered preoperatively.  No barriers to understanding were noted. I explained the natural history of the disease process and Rx rationale.  I explained to the patient what I considered to be reasonable expectations given their personal situation.  The final treatment plan was arrived at through a shared patient decision making process model.     I personally saw and evaluated the  patient, and participated in the management and  treatment plan.  Vanetta Mulders, MD Attending Physician, Orthopedic Surgery  This document was dictated using Dragon voice recognition software. A reasonable attempt at proof reading has been made to minimize errors.

## 2021-01-25 NOTE — Telephone Encounter (Signed)
Mendel Ryder, NP with [name] called concerning patient's left hip.  Stated that incision on left hip is red and is draining a lot and that patient is on Rocephin IM.  A high message was sent on Friday and per Mendel Ryder, no one has returned her call concerning patient.  Patient has an appointment scheduled Thrusday, 01/25/2021.  CB# [phone].  Please advise.  Thank you.

## 2021-01-25 NOTE — Pre-Procedure Instructions (Signed)
Jason Hartman  01/25/2021    [name]' procedure is scheduled on Friday, 01/26/21 at 2:45 PM.   Report to [facility] Entrance "A" Admitting Office  at 12:15 PM.   Call this number if you have problems the morning of surgery:  [phone]     Remember:  Patient is not to eat food after midnight tonight.  You may drink clear liquids until 11:45 AM.  Clear liquids allowed are:   Water, Juice (non-citric and without pulp - diabetics please choose diet or no sugar options), Carbonated beverages - (diabetics please choose diet or no sugar options), Clear Tea, Black Coffee only (no creamer, milk or cream including half and half), Plain Jell-O only (diabetics please choose diet or no sugar options), and Gatorade (diabetics please choose diet or no sugar options)    Take these medicines the morning of surgery with A SIP OF WATER: Amiodarone, Clonazepam, Famotidine, Metoprolol, Tramadol, Olanzapine - prn   How to Manage Your Diabetes Before Surgery  Do not take oral diabetes medicines (pills) the morning of surgery. (No Metformin in AM) THE NIGHT BEFORE SURGERY, take 4 units of Lantus Insulin.   THE MORNING OF SURGERY, take 7 units of Lantus Insulin.  Please check patient's blood sugar when he awakens in the AM and every 2 hours until he leaves for the hospital.  If blood sugar is 70 or below, treat with 1/2 cup of clear juice (apple or cranberry) and recheck blood sugar 15 minutes after drinking juice. If blood sugar continues to be 70 or below, call the Short Stay department and ask to speak to a nurse.  Per instructions from Dr. Belinda Block, Anesthesiologist/[name] [name], RN   Do not wear jewelry.  Do not wear lotions, powders, cologne or deodorant.  Men may shave face and neck.  Do not bring valuables to the hospital.  [facility] is not responsible for any belongings or valuables.  Contacts, dentures or bridgework may not be worn into surgery.  Leave your suitcase in the  car.  After surgery it may be brought to your room.  For patients admitted to the hospital, discharge time will be determined by your treatment team.  Patients discharged the day of surgery will not be allowed to drive home.

## 2021-01-25 NOTE — Progress Notes (Signed)
Pt is a resident at [facility]. Spoke with Augusto Garbe., LPN. I requested that she fax the [facility] to the Pharmacy Call Center and gave her the fax number. She states pt is oriented to himself and to his wife, will answer questions. His wife will be meeting him here. Pt had surgery here in September.  See note that I sent to the SNF for the instructions that were given

## 2021-01-26 ENCOUNTER — Encounter (HOSPITAL_COMMUNITY)
Admission: RE | Disposition: A | Discharge status: Skilled Nursing Facility | Payer: Self-pay | Source: Home / Self Care | Attending: Internal Medicine

## 2021-01-26 ENCOUNTER — Ambulatory Visit (HOSPITAL_COMMUNITY): Payer: Medicare Other | Admitting: Registered Nurse

## 2021-01-26 ENCOUNTER — Inpatient Hospital Stay (HOSPITAL_COMMUNITY)
Admission: RE | Admit: 2021-01-26 | Discharge: 2021-02-01 | DRG: 481 | Disposition: A | Discharge status: Skilled Nursing Facility | Payer: Medicare Other | Attending: Internal Medicine | Admitting: Internal Medicine

## 2021-01-26 DIAGNOSIS — E875 Hyperkalemia: Secondary | ICD-10-CM | POA: Diagnosis present

## 2021-01-26 DIAGNOSIS — Z7901 Long term (current) use of anticoagulants: Secondary | ICD-10-CM

## 2021-01-26 DIAGNOSIS — I480 Paroxysmal atrial fibrillation: Secondary | ICD-10-CM | POA: Diagnosis present

## 2021-01-26 DIAGNOSIS — B961 Klebsiella pneumoniae [K. pneumoniae] as the cause of diseases classified elsewhere: Secondary | ICD-10-CM | POA: Diagnosis present

## 2021-01-26 DIAGNOSIS — K5901 Slow transit constipation: Secondary | ICD-10-CM | POA: Diagnosis present

## 2021-01-26 DIAGNOSIS — E1142 Type 2 diabetes mellitus with diabetic polyneuropathy: Secondary | ICD-10-CM | POA: Diagnosis present

## 2021-01-26 DIAGNOSIS — Z96642 Presence of left artificial hip joint: Secondary | ICD-10-CM | POA: Diagnosis present

## 2021-01-26 DIAGNOSIS — T8189XA Other complications of procedures, not elsewhere classified, initial encounter: Secondary | ICD-10-CM | POA: Diagnosis not present

## 2021-01-26 DIAGNOSIS — E871 Hypo-osmolality and hyponatremia: Secondary | ICD-10-CM | POA: Diagnosis not present

## 2021-01-26 DIAGNOSIS — T8452XD Infection and inflammatory reaction due to internal left hip prosthesis, subsequent encounter: Secondary | ICD-10-CM | POA: Diagnosis not present

## 2021-01-26 DIAGNOSIS — Z8619 Personal history of other infectious and parasitic diseases: Secondary | ICD-10-CM

## 2021-01-26 DIAGNOSIS — Z7984 Long term (current) use of oral hypoglycemic drugs: Secondary | ICD-10-CM

## 2021-01-26 DIAGNOSIS — A4902 Methicillin resistant Staphylococcus aureus infection, unspecified site: Secondary | ICD-10-CM | POA: Diagnosis not present

## 2021-01-26 DIAGNOSIS — N1831 Chronic kidney disease, stage 3a: Secondary | ICD-10-CM | POA: Diagnosis present

## 2021-01-26 DIAGNOSIS — W06XXXA Fall from bed, initial encounter: Secondary | ICD-10-CM | POA: Diagnosis present

## 2021-01-26 DIAGNOSIS — I10 Essential (primary) hypertension: Secondary | ICD-10-CM | POA: Diagnosis present

## 2021-01-26 DIAGNOSIS — A499 Bacterial infection, unspecified: Secondary | ICD-10-CM | POA: Diagnosis not present

## 2021-01-26 DIAGNOSIS — G4730 Sleep apnea, unspecified: Secondary | ICD-10-CM | POA: Diagnosis present

## 2021-01-26 DIAGNOSIS — E1169 Type 2 diabetes mellitus with other specified complication: Secondary | ICD-10-CM | POA: Diagnosis not present

## 2021-01-26 DIAGNOSIS — E785 Hyperlipidemia, unspecified: Secondary | ICD-10-CM | POA: Diagnosis present

## 2021-01-26 DIAGNOSIS — G20 Parkinson's disease: Secondary | ICD-10-CM | POA: Diagnosis not present

## 2021-01-26 DIAGNOSIS — I129 Hypertensive chronic kidney disease with stage 1 through stage 4 chronic kidney disease, or unspecified chronic kidney disease: Secondary | ICD-10-CM | POA: Diagnosis present

## 2021-01-26 DIAGNOSIS — R4182 Altered mental status, unspecified: Secondary | ICD-10-CM | POA: Diagnosis not present

## 2021-01-26 DIAGNOSIS — Z833 Family history of diabetes mellitus: Secondary | ICD-10-CM

## 2021-01-26 DIAGNOSIS — B9562 Methicillin resistant Staphylococcus aureus infection as the cause of diseases classified elsewhere: Secondary | ICD-10-CM | POA: Diagnosis present

## 2021-01-26 DIAGNOSIS — B9561 Methicillin susceptible Staphylococcus aureus infection as the cause of diseases classified elsewhere: Secondary | ICD-10-CM | POA: Diagnosis present

## 2021-01-26 DIAGNOSIS — Z96641 Presence of right artificial hip joint: Secondary | ICD-10-CM | POA: Diagnosis not present

## 2021-01-26 DIAGNOSIS — Z885 Allergy status to narcotic agent status: Secondary | ICD-10-CM

## 2021-01-26 DIAGNOSIS — Z8711 Personal history of peptic ulcer disease: Secondary | ICD-10-CM

## 2021-01-26 DIAGNOSIS — S70212A Abrasion, left hip, initial encounter: Secondary | ICD-10-CM | POA: Diagnosis not present

## 2021-01-26 DIAGNOSIS — Z1624 Resistance to multiple antibiotics: Secondary | ICD-10-CM | POA: Diagnosis present

## 2021-01-26 DIAGNOSIS — Z881 Allergy status to other antibiotic agents status: Secondary | ICD-10-CM

## 2021-01-26 DIAGNOSIS — I2510 Atherosclerotic heart disease of native coronary artery without angina pectoris: Secondary | ICD-10-CM | POA: Diagnosis present

## 2021-01-26 DIAGNOSIS — F410 Panic disorder [episodic paroxysmal anxiety] without agoraphobia: Secondary | ICD-10-CM | POA: Diagnosis present

## 2021-01-26 DIAGNOSIS — E1122 Type 2 diabetes mellitus with diabetic chronic kidney disease: Secondary | ICD-10-CM | POA: Diagnosis present

## 2021-01-26 DIAGNOSIS — Z87891 Personal history of nicotine dependence: Secondary | ICD-10-CM

## 2021-01-26 DIAGNOSIS — R531 Weakness: Secondary | ICD-10-CM | POA: Diagnosis not present

## 2021-01-26 DIAGNOSIS — L089 Local infection of the skin and subcutaneous tissue, unspecified: Secondary | ICD-10-CM | POA: Diagnosis not present

## 2021-01-26 DIAGNOSIS — Z20822 Contact with and (suspected) exposure to covid-19: Secondary | ICD-10-CM | POA: Diagnosis present

## 2021-01-26 DIAGNOSIS — Z888 Allergy status to other drugs, medicaments and biological substances status: Secondary | ICD-10-CM

## 2021-01-26 DIAGNOSIS — D62 Acute posthemorrhagic anemia: Secondary | ICD-10-CM | POA: Diagnosis not present

## 2021-01-26 DIAGNOSIS — G8918 Other acute postprocedural pain: Secondary | ICD-10-CM | POA: Diagnosis not present

## 2021-01-26 DIAGNOSIS — R54 Age-related physical debility: Secondary | ICD-10-CM | POA: Diagnosis present

## 2021-01-26 DIAGNOSIS — Z96649 Presence of unspecified artificial hip joint: Secondary | ICD-10-CM

## 2021-01-26 DIAGNOSIS — Z6822 Body mass index (BMI) 22.0-22.9, adult: Secondary | ICD-10-CM

## 2021-01-26 DIAGNOSIS — Z882 Allergy status to sulfonamides status: Secondary | ICD-10-CM

## 2021-01-26 DIAGNOSIS — W010XXA Fall on same level from slipping, tripping and stumbling without subsequent striking against object, initial encounter: Secondary | ICD-10-CM

## 2021-01-26 DIAGNOSIS — Z91041 Radiographic dye allergy status: Secondary | ICD-10-CM

## 2021-01-26 DIAGNOSIS — G2581 Restless legs syndrome: Secondary | ICD-10-CM | POA: Diagnosis present

## 2021-01-26 DIAGNOSIS — A4901 Methicillin susceptible Staphylococcus aureus infection, unspecified site: Secondary | ICD-10-CM

## 2021-01-26 DIAGNOSIS — Y831 Surgical operation with implant of artificial internal device as the cause of abnormal reaction of the patient, or of later complication, without mention of misadventure at the time of the procedure: Secondary | ICD-10-CM | POA: Diagnosis present

## 2021-01-26 DIAGNOSIS — F22 Delusional disorders: Secondary | ICD-10-CM | POA: Diagnosis not present

## 2021-01-26 DIAGNOSIS — Z79899 Other long term (current) drug therapy: Secondary | ICD-10-CM

## 2021-01-26 DIAGNOSIS — R5381 Other malaise: Secondary | ICD-10-CM | POA: Diagnosis not present

## 2021-01-26 DIAGNOSIS — N400 Enlarged prostate without lower urinary tract symptoms: Secondary | ICD-10-CM | POA: Diagnosis present

## 2021-01-26 DIAGNOSIS — Z794 Long term (current) use of insulin: Secondary | ICD-10-CM

## 2021-01-26 DIAGNOSIS — M868X8 Other osteomyelitis, other site: Secondary | ICD-10-CM | POA: Diagnosis not present

## 2021-01-26 DIAGNOSIS — T8452XA Infection and inflammatory reaction due to internal left hip prosthesis, initial encounter: Secondary | ICD-10-CM | POA: Diagnosis not present

## 2021-01-26 DIAGNOSIS — E559 Vitamin D deficiency, unspecified: Secondary | ICD-10-CM | POA: Diagnosis not present

## 2021-01-26 DIAGNOSIS — R5383 Other fatigue: Secondary | ICD-10-CM | POA: Diagnosis not present

## 2021-01-26 DIAGNOSIS — K219 Gastro-esophageal reflux disease without esophagitis: Secondary | ICD-10-CM | POA: Diagnosis present

## 2021-01-26 DIAGNOSIS — G4733 Obstructive sleep apnea (adult) (pediatric): Secondary | ICD-10-CM | POA: Diagnosis not present

## 2021-01-26 DIAGNOSIS — Z7401 Bed confinement status: Secondary | ICD-10-CM | POA: Diagnosis not present

## 2021-01-26 DIAGNOSIS — T8149XA Infection following a procedure, other surgical site, initial encounter: Secondary | ICD-10-CM

## 2021-01-26 DIAGNOSIS — Z1612 Extended spectrum beta lactamase (ESBL) resistance: Secondary | ICD-10-CM | POA: Diagnosis not present

## 2021-01-26 HISTORY — PX: APPLICATION OF WOUND VAC: SHX5189

## 2021-01-26 HISTORY — PX: I & D EXTREMITY: SHX5045

## 2021-01-26 LAB — GLUCOSE, CAPILLARY
Glucose-Capillary: 132 mg/dL — ABNORMAL HIGH (ref 70–99)
Glucose-Capillary: 140 mg/dL — ABNORMAL HIGH (ref 70–99)
Glucose-Capillary: 219 mg/dL — ABNORMAL HIGH (ref 70–99)

## 2021-01-26 LAB — BASIC METABOLIC PANEL
Anion gap: 10 (ref 5–15)
BUN: 20 mg/dL (ref 8–23)
CO2: 21 mmol/L — ABNORMAL LOW (ref 22–32)
Calcium: 8.4 mg/dL — ABNORMAL LOW (ref 8.9–10.3)
Chloride: 98 mmol/L (ref 98–111)
Creatinine, Ser: 1.07 mg/dL (ref 0.61–1.24)
GFR, Estimated: >60 mL/min (ref >60)
Glucose, Bld: 220 mg/dL — ABNORMAL HIGH (ref 70–99)
Potassium: 5.1 mmol/L (ref 3.5–5.1)
Sodium: 129 mmol/L — ABNORMAL LOW (ref 135–145)

## 2021-01-26 SURGERY — IRRIGATION AND DEBRIDEMENT EXTREMITY
Anesthesia: General | Site: Hip | Laterality: Left

## 2021-01-26 MED ORDER — METOPROLOL TARTRATE 12.5 MG HALF TABLET
12.5000 mg | ORAL_TABLET | Freq: Two times a day (BID) | ORAL | Status: DC
  Administered 2021-01-26 – 2021-02-01 (×12): 12.5 mg via ORAL
  Filled 2021-01-26 (×13): qty 1

## 2021-01-26 MED ORDER — HYDROCODONE-ACETAMINOPHEN 5-325 MG PO TABS: 1.0000 | ORAL_TABLET | ORAL | Status: DC | PRN

## 2021-01-26 MED ORDER — THIAMINE HCL 100 MG PO TABS
100.0000 mg | ORAL_TABLET | Freq: Every day | ORAL | Status: DC
  Administered 2021-01-27 – 2021-02-01 (×6): 100 mg via ORAL
  Filled 2021-01-26 (×6): qty 1

## 2021-01-26 MED ORDER — ESMOLOL HCL 100 MG/10ML IV SOLN
INTRAVENOUS | Status: DC | PRN
  Administered 2021-01-26 (×3): 10 mg via INTRAVENOUS

## 2021-01-26 MED ORDER — ONDANSETRON HCL 4 MG/2ML IJ SOLN
INTRAMUSCULAR | Status: DC | PRN
  Administered 2021-01-26: 4 mg via INTRAVENOUS

## 2021-01-26 MED ORDER — SODIUM CHLORIDE 0.9 % IR SOLN
Status: DC | PRN
  Administered 2021-01-26: 3000 mL

## 2021-01-26 MED ORDER — INSULIN ASPART 100 UNIT/ML IJ SOLN
0.0000 [IU] | Freq: Every day | INTRAMUSCULAR | Status: DC
  Administered 2021-01-26 – 2021-01-29 (×2): 2 [IU] via SUBCUTANEOUS

## 2021-01-26 MED ORDER — FENTANYL CITRATE (PF) 100 MCG/2ML IJ SOLN
INTRAMUSCULAR | Status: AC
  Filled 2021-01-26: qty 2

## 2021-01-26 MED ORDER — INSULIN GLARGINE-YFGN 100 UNIT/ML ~~LOC~~ SOLN
4.0000 [IU] | Freq: Two times a day (BID) | SUBCUTANEOUS | Status: DC
  Administered 2021-01-26 – 2021-02-01 (×12): 4 [IU] via SUBCUTANEOUS
  Filled 2021-01-26 (×14): qty 0.04

## 2021-01-26 MED ORDER — GABAPENTIN 300 MG PO CAPS
300.0000 mg | ORAL_CAPSULE | Freq: Once | ORAL | Status: AC
  Administered 2021-01-26: 300 mg via ORAL
  Filled 2021-01-26: qty 1

## 2021-01-26 MED ORDER — CLONAZEPAM 0.25 MG PO TBDP
0.2500 mg | ORAL_TABLET | Freq: Every day | ORAL | Status: DC
  Administered 2021-01-27 – 2021-02-01 (×6): 0.25 mg via ORAL
  Filled 2021-01-26 (×6): qty 1

## 2021-01-26 MED ORDER — FENTANYL CITRATE (PF) 100 MCG/2ML IJ SOLN
25.0000 ug | INTRAMUSCULAR | Status: DC | PRN
  Administered 2021-01-26 (×3): 50 ug via INTRAVENOUS

## 2021-01-26 MED ORDER — ONDANSETRON HCL 4 MG/2ML IJ SOLN: 4.0000 mg | Freq: Once | INTRAMUSCULAR | Status: DC | PRN

## 2021-01-26 MED ORDER — PROPOFOL 10 MG/ML IV BOLUS
INTRAVENOUS | Status: DC | PRN
  Administered 2021-01-26: 100 mg via INTRAVENOUS

## 2021-01-26 MED ORDER — HEPARIN SODIUM (PORCINE) 5000 UNIT/ML IJ SOLN
5000.0000 [IU] | Freq: Three times a day (TID) | INTRAMUSCULAR | Status: DC
  Administered 2021-01-27 – 2021-01-31 (×14): 5000 [IU] via SUBCUTANEOUS
  Filled 2021-01-26 (×14): qty 1

## 2021-01-26 MED ORDER — LIDOCAINE 2% (20 MG/ML) 5 ML SYRINGE
INTRAMUSCULAR | Status: DC | PRN
  Administered 2021-01-26: 100 mg via INTRAVENOUS

## 2021-01-26 MED ORDER — ROTIGOTINE 8 MG/24HR TD PT24
1.0000 | MEDICATED_PATCH | Freq: Every day | TRANSDERMAL | Status: DC
  Administered 2021-01-27 – 2021-01-31 (×5): 1 via TRANSDERMAL
  Filled 2021-01-26 (×6): qty 30

## 2021-01-26 MED ORDER — CHLORHEXIDINE GLUCONATE 0.12 % MT SOLN
OROMUCOSAL | Status: AC
  Filled 2021-01-26: qty 15

## 2021-01-26 MED ORDER — OLANZAPINE 5 MG PO TABS
2.5000 mg | ORAL_TABLET | Freq: Two times a day (BID) | ORAL | Status: DC | PRN
  Administered 2021-01-27 – 2021-01-30 (×3): 2.5 mg via ORAL
  Filled 2021-01-26 (×3): qty 1

## 2021-01-26 MED ORDER — HYDROMORPHONE HCL 1 MG/ML IJ SOLN
0.5000 mg | INTRAMUSCULAR | Status: DC | PRN
  Administered 2021-01-27 – 2021-01-29 (×6): 1 mg via INTRAVENOUS
  Administered 2021-01-29: 0.5 mg via INTRAVENOUS
  Administered 2021-01-29 – 2021-01-31 (×8): 1 mg via INTRAVENOUS
  Administered 2021-01-31: 0.5 mg via INTRAVENOUS
  Administered 2021-01-31 – 2021-02-01 (×5): 1 mg via INTRAVENOUS
  Filled 2021-01-26 (×22): qty 1

## 2021-01-26 MED ORDER — METHOCARBAMOL 500 MG PO TABS
500.0000 mg | ORAL_TABLET | Freq: Four times a day (QID) | ORAL | Status: DC | PRN
  Administered 2021-01-26 – 2021-01-31 (×9): 500 mg via ORAL
  Filled 2021-01-26 (×9): qty 1

## 2021-01-26 MED ORDER — SODIUM CHLORIDE 0.9 % IV SOLN
2.0000 g | INTRAVENOUS | Status: DC
  Administered 2021-01-27: 2 g via INTRAVENOUS
  Filled 2021-01-26: qty 20

## 2021-01-26 MED ORDER — DOCUSATE SODIUM 100 MG PO CAPS
100.0000 mg | ORAL_CAPSULE | Freq: Two times a day (BID) | ORAL | Status: DC
  Administered 2021-01-26 – 2021-02-01 (×12): 100 mg via ORAL
  Filled 2021-01-26 (×12): qty 1

## 2021-01-26 MED ORDER — DEXAMETHASONE SODIUM PHOSPHATE 10 MG/ML IJ SOLN
INTRAMUSCULAR | Status: DC | PRN
Start: 2021-01-26 — End: 2021-01-26
  Administered 2021-01-26: 5 mg via INTRAVENOUS

## 2021-01-26 MED ORDER — ONDANSETRON HCL 4 MG PO TABS: 4.0000 mg | ORAL_TABLET | Freq: Four times a day (QID) | ORAL | Status: DC | PRN

## 2021-01-26 MED ORDER — FERROUS SULFATE 325 (65 FE) MG PO TABS
325.0000 mg | ORAL_TABLET | Freq: Every day | ORAL | Status: DC
  Administered 2021-01-27 – 2021-02-01 (×6): 325 mg via ORAL
  Filled 2021-01-26 (×6): qty 1

## 2021-01-26 MED ORDER — LACTATED RINGERS IV SOLN: INTRAVENOUS | Status: DC

## 2021-01-26 MED ORDER — LACTATED RINGERS IV SOLN: INTRAVENOUS | Status: DC | PRN

## 2021-01-26 MED ORDER — FAMOTIDINE 20 MG PO TABS
20.0000 mg | ORAL_TABLET | Freq: Every day | ORAL | Status: DC
  Administered 2021-01-27 – 2021-02-01 (×6): 20 mg via ORAL
  Filled 2021-01-26 (×6): qty 1

## 2021-01-26 MED ORDER — OLANZAPINE 5 MG PO TABS
2.5000 mg | ORAL_TABLET | Freq: Every day | ORAL | Status: DC
  Administered 2021-01-26 – 2021-01-31 (×6): 2.5 mg via ORAL
  Filled 2021-01-26 (×6): qty 1

## 2021-01-26 MED ORDER — FENTANYL CITRATE (PF) 100 MCG/2ML IJ SOLN
INTRAMUSCULAR | Status: AC
  Administered 2021-01-26: 50 ug via INTRAVENOUS
  Filled 2021-01-26: qty 2

## 2021-01-26 MED ORDER — CLONAZEPAM 0.5 MG PO TABS
0.5000 mg | ORAL_TABLET | Freq: Every day | ORAL | Status: DC
  Administered 2021-01-26 – 2021-01-31 (×6): 0.5 mg via ORAL
  Filled 2021-01-26 (×6): qty 1

## 2021-01-26 MED ORDER — VANCOMYCIN HCL 1500 MG/300ML IV SOLN
1500.0000 mg | Freq: Once | INTRAVENOUS | Status: AC
  Administered 2021-01-26: 1500 mg via INTRAVENOUS
  Filled 2021-01-26: qty 300

## 2021-01-26 MED ORDER — 0.9 % SODIUM CHLORIDE (POUR BTL) OPTIME
TOPICAL | Status: DC | PRN
  Administered 2021-01-26: 1000 mL

## 2021-01-26 MED ORDER — ONDANSETRON HCL 4 MG/2ML IJ SOLN: 4.0000 mg | Freq: Four times a day (QID) | INTRAMUSCULAR | Status: DC | PRN

## 2021-01-26 MED ORDER — CEFAZOLIN SODIUM-DEXTROSE 2-4 GM/100ML-% IV SOLN
2.0000 g | INTRAVENOUS | Status: AC
  Administered 2021-01-26: 2 g via INTRAVENOUS
  Filled 2021-01-26: qty 100

## 2021-01-26 MED ORDER — TRAMADOL HCL 50 MG PO TABS
75.0000 mg | ORAL_TABLET | Freq: Two times a day (BID) | ORAL | Status: DC
  Administered 2021-01-26 – 2021-02-01 (×12): 75 mg via ORAL
  Filled 2021-01-26 (×12): qty 2

## 2021-01-26 MED ORDER — FENTANYL CITRATE (PF) 100 MCG/2ML IJ SOLN: 50.0000 ug | Freq: Once | INTRAMUSCULAR | Status: AC

## 2021-01-26 MED ORDER — INSULIN ASPART 100 UNIT/ML IJ SOLN
0.0000 [IU] | Freq: Three times a day (TID) | INTRAMUSCULAR | Status: DC
  Administered 2021-01-27: 2 [IU] via SUBCUTANEOUS
  Administered 2021-01-27: 1 [IU] via SUBCUTANEOUS
  Administered 2021-01-27 – 2021-01-28 (×2): 2 [IU] via SUBCUTANEOUS
  Administered 2021-01-28 – 2021-01-29 (×3): 1 [IU] via SUBCUTANEOUS
  Administered 2021-01-29 – 2021-01-30 (×3): 2 [IU] via SUBCUTANEOUS
  Administered 2021-01-30: 1 [IU] via SUBCUTANEOUS
  Administered 2021-01-30: 2 [IU] via SUBCUTANEOUS
  Administered 2021-01-31 (×2): 1 [IU] via SUBCUTANEOUS
  Administered 2021-01-31 – 2021-02-01 (×3): 2 [IU] via SUBCUTANEOUS

## 2021-01-26 MED ORDER — FENTANYL CITRATE (PF) 250 MCG/5ML IJ SOLN
INTRAMUSCULAR | Status: AC
  Filled 2021-01-26: qty 5

## 2021-01-26 MED ORDER — ACETAMINOPHEN 500 MG PO TABS: 1000.0000 mg | ORAL_TABLET | Freq: Once | ORAL | Status: DC

## 2021-01-26 MED ORDER — LIDOCAINE 2% (20 MG/ML) 5 ML SYRINGE
INTRAMUSCULAR | Status: AC
  Filled 2021-01-26: qty 5

## 2021-01-26 MED ORDER — MELATONIN 5 MG PO TABS
5.0000 mg | ORAL_TABLET | Freq: Every day | ORAL | Status: DC
  Administered 2021-01-26 – 2021-01-31 (×6): 5 mg via ORAL
  Filled 2021-01-26 (×6): qty 1

## 2021-01-26 MED ORDER — SODIUM CHLORIDE 0.9 % IV SOLN: INTRAVENOUS | Status: DC

## 2021-01-26 MED ORDER — VANCOMYCIN HCL 1250 MG/250ML IV SOLN
1250.0000 mg | INTRAVENOUS | Status: DC
  Administered 2021-01-27 – 2021-01-31 (×5): 1250 mg via INTRAVENOUS
  Filled 2021-01-26 (×6): qty 250

## 2021-01-26 MED ORDER — FENTANYL CITRATE (PF) 250 MCG/5ML IJ SOLN
INTRAMUSCULAR | Status: DC | PRN
  Administered 2021-01-26: 100 ug via INTRAVENOUS
  Administered 2021-01-26: 25 ug via INTRAVENOUS
  Administered 2021-01-26 (×2): 50 ug via INTRAVENOUS
  Administered 2021-01-26: 25 ug via INTRAVENOUS

## 2021-01-26 MED ORDER — GABAPENTIN 300 MG PO CAPS
300.0000 mg | ORAL_CAPSULE | Freq: Every day | ORAL | Status: DC
  Administered 2021-01-26 – 2021-01-31 (×6): 300 mg via ORAL
  Filled 2021-01-26 (×6): qty 1

## 2021-01-26 MED ORDER — AMIODARONE HCL 200 MG PO TABS
200.0000 mg | ORAL_TABLET | Freq: Every day | ORAL | Status: DC
  Administered 2021-01-27 – 2021-02-01 (×6): 200 mg via ORAL
  Filled 2021-01-26 (×6): qty 1

## 2021-01-26 MED ORDER — PROPOFOL 10 MG/ML IV BOLUS
INTRAVENOUS | Status: AC
  Filled 2021-01-26: qty 20

## 2021-01-26 MED ORDER — TRANEXAMIC ACID-NACL 1000-0.7 MG/100ML-% IV SOLN
1000.0000 mg | INTRAVENOUS | Status: AC
  Administered 2021-01-26: 1000 mg via INTRAVENOUS
  Filled 2021-01-26: qty 100

## 2021-01-26 MED ORDER — ACETAMINOPHEN 500 MG PO TABS
1000.0000 mg | ORAL_TABLET | Freq: Four times a day (QID) | ORAL | Status: AC
  Administered 2021-01-26 – 2021-01-27 (×4): 1000 mg via ORAL
  Filled 2021-01-26 (×4): qty 2

## 2021-01-26 MED ORDER — ESMOLOL HCL 100 MG/10ML IV SOLN
INTRAVENOUS | Status: AC
  Filled 2021-01-26: qty 10

## 2021-01-26 SURGICAL SUPPLY — 61 items
BAG COUNTER SPONGE SURGICOUNT (BAG) ×2 IMPLANT
BANDAGE ESMARK 6X9 LF (GAUZE/BANDAGES/DRESSINGS) IMPLANT
BLADE SURG 10 STRL SS (BLADE) ×2 IMPLANT
BNDG COHESIVE 4X5 TAN STRL (GAUZE/BANDAGES/DRESSINGS) ×2 IMPLANT
BNDG ELASTIC 4X5.8 VLCR STR LF (GAUZE/BANDAGES/DRESSINGS) ×2 IMPLANT
BNDG ELASTIC 6X5.8 VLCR STR LF (GAUZE/BANDAGES/DRESSINGS) ×2 IMPLANT
BNDG ESMARK 4X9 LF (GAUZE/BANDAGES/DRESSINGS) IMPLANT
BNDG ESMARK 6X9 LF (GAUZE/BANDAGES/DRESSINGS)
BNDG GAUZE ELAST 4 BULKY (GAUZE/BANDAGES/DRESSINGS) ×2 IMPLANT
CANISTER WOUNDNEG PRESSURE 500 (CANNISTER) ×2 IMPLANT
CNTNR URN SCR LID CUP LEK RST (MISCELLANEOUS) IMPLANT
CONT SPEC 4OZ STRL OR WHT (MISCELLANEOUS)
COVER SURGICAL LIGHT HANDLE (MISCELLANEOUS) ×2 IMPLANT
CUFF TOURN SGL LL 12 NO SLV (MISCELLANEOUS) IMPLANT
CUFF TOURN SGL QUICK 34 (TOURNIQUET CUFF)
CUFF TRNQT CYL 34X4.125X (TOURNIQUET CUFF) IMPLANT
DRAPE SURG 17X23 STRL (DRAPES) IMPLANT
DRAPE U-SHAPE 47X51 STRL (DRAPES) IMPLANT
DRSG ADAPTIC 3X8 NADH LF (GAUZE/BANDAGES/DRESSINGS) ×2 IMPLANT
DRSG PAD ABDOMINAL 8X10 ST (GAUZE/BANDAGES/DRESSINGS) ×2 IMPLANT
DRSG VAC ATS MED SENSATRAC (GAUZE/BANDAGES/DRESSINGS) ×2 IMPLANT
DURAPREP 26ML APPLICATOR (WOUND CARE) ×2 IMPLANT
ELECT REM PT RETURN 9FT ADLT (ELECTROSURGICAL)
ELECTRODE REM PT RTRN 9FT ADLT (ELECTROSURGICAL) IMPLANT
EVACUATOR 1/8 PVC DRAIN (DRAIN) IMPLANT
FACESHIELD WRAPAROUND (MASK) ×2 IMPLANT
GAUZE SPONGE 4X4 12PLY STRL (GAUZE/BANDAGES/DRESSINGS) ×2 IMPLANT
GAUZE XEROFORM 1X8 LF (GAUZE/BANDAGES/DRESSINGS) ×2 IMPLANT
GLOVE SRG 8 PF TXTR STRL LF DI (GLOVE) ×1 IMPLANT
GLOVE SURG ENC MOIS LTX SZ7.5 (GLOVE) ×2 IMPLANT
GLOVE SURG SYN 7.5  E (GLOVE) ×2
GLOVE SURG SYN 7.5 E (GLOVE) ×1 IMPLANT
GLOVE SURG UNDER POLY LF SZ7.5 (GLOVE) ×2 IMPLANT
GLOVE SURG UNDER POLY LF SZ8 (GLOVE) ×2
GOWN STRL REUS W/ TWL LRG LVL3 (GOWN DISPOSABLE) ×2 IMPLANT
GOWN STRL REUS W/ TWL XL LVL3 (GOWN DISPOSABLE) ×2 IMPLANT
GOWN STRL REUS W/TWL LRG LVL3 (GOWN DISPOSABLE) ×4
GOWN STRL REUS W/TWL XL LVL3 (GOWN DISPOSABLE) ×4
HANDPIECE INTERPULSE COAX TIP (DISPOSABLE)
KIT BASIN OR (CUSTOM PROCEDURE TRAY) ×2 IMPLANT
KIT TURNOVER KIT B (KITS) ×2 IMPLANT
MANIFOLD NEPTUNE II (INSTRUMENTS) ×2 IMPLANT
NEEDLE HYPO 25GX1X1/2 BEV (NEEDLE) IMPLANT
NS IRRIG 1000ML POUR BTL (IV SOLUTION) ×2 IMPLANT
PACK ORTHO EXTREMITY (CUSTOM PROCEDURE TRAY) ×2 IMPLANT
PAD ARMBOARD 7.5X6 YLW CONV (MISCELLANEOUS) ×4 IMPLANT
SET CYSTO W/LG BORE CLAMP LF (SET/KITS/TRAYS/PACK) IMPLANT
SET HNDPC FAN SPRY TIP SCT (DISPOSABLE) IMPLANT
SPONGE T-LAP 18X18 ~~LOC~~+RFID (SPONGE) IMPLANT
STOCKINETTE IMPERVIOUS 9X36 MD (GAUZE/BANDAGES/DRESSINGS) ×2 IMPLANT
SUT ETHILON 2 0 PSLX (SUTURE) ×2 IMPLANT
SUT ETHILON 3 0 PS 1 (SUTURE) ×4 IMPLANT
SUT PDS AB 0 CT1 27 (SUTURE) ×2 IMPLANT
SUT PDS AB 2-0 CT1 27 (SUTURE) ×4 IMPLANT
SWAB CULTURE ESWAB REG 1ML (MISCELLANEOUS) IMPLANT
SYR CONTROL 10ML LL (SYRINGE) IMPLANT
TOWEL GREEN STERILE (TOWEL DISPOSABLE) ×2 IMPLANT
TOWEL GREEN STERILE FF (TOWEL DISPOSABLE) ×2 IMPLANT
TUBE CONNECTING 12X1/4 (SUCTIONS) ×2 IMPLANT
UNDERPAD 30X36 HEAVY ABSORB (UNDERPADS AND DIAPERS) ×2 IMPLANT
YANKAUER SUCT BULB TIP NO VENT (SUCTIONS) ×2 IMPLANT

## 2021-01-26 NOTE — Anesthesia Preprocedure Evaluation (Addendum)
Anesthesia Evaluation  Patient identified by MRN, date of birth, ID band Patient awake    Reviewed: Allergy & Precautions, NPO status , Patient's Chart, lab work & pertinent test results, reviewed documented beta blocker date and time   History of Anesthesia Complications Negative for: history of anesthetic complications  Airway Mallampati: II  TM Distance: >3 FB Neck ROM: Full    Dental no notable dental hx. (+) Dental Advisory Given   Pulmonary sleep apnea (does not use CPAP) , former smoker,    Pulmonary exam normal breath sounds clear to auscultation       Cardiovascular hypertension, Pt. on medications and Pt. on home beta blockers (-) angina+ CAD and + Cardiac Stents  Normal cardiovascular exam+ dysrhythmias Atrial Fibrillation  Rhythm:Irregular Rate:Normal  09/2020 ECHO: EF 55-60%. LV normal function, no regional wall motion abnormalities, mild LVH, Grade I DD, mild MR, aortic sclerosis without stenosis   Neuro/Psych Anxiety    GI/Hepatic Neg liver ROS, PUD, GERD  Medicated and Controlled,  Endo/Other  diabetes, Type 2, Insulin Dependent, Oral Hypoglycemic Agents  Renal/GU Renal InsufficiencyRenal disease     Musculoskeletal  (+) Arthritis , Osteoarthritis,    Abdominal Normal abdominal exam  (+)   Peds  Hematology  (+) Blood dyscrasia (Hb 11.5), ,   Anesthesia Other Findings   Reproductive/Obstetrics                             Anesthesia Physical  Anesthesia Plan  ASA: 3  Anesthesia Plan: General   Post-op Pain Management:    Induction:   PONV Risk Score and Plan: 2 and Ondansetron and Dexamethasone  Airway Management Planned: LMA  Additional Equipment: None  Intra-op Plan:   Post-operative Plan: Extubation in OR  Informed Consent: I have reviewed the patients History and Physical, chart, labs and discussed the procedure including the risks, benefits and alternatives  for the proposed anesthesia with the patient or authorized representative who has indicated his/her understanding and acceptance.     Dental advisory given  Plan Discussed with: CRNA and Surgeon  Anesthesia Plan Comments: (Pt prefers SAB)       Anesthesia Quick Evaluation

## 2021-01-26 NOTE — Progress Notes (Signed)
Pharmacy Antibiotic Note  Jason Hartman is a 85 y.o. male admitted on 01/26/2021 with  Left hip drainage/wound infection .  Pharmacy has been consulted for vancomycin  dosing.  10/6 Scr 0.94 Intra-operative cultures in progress   Plan: Vancomycin 1500mg  x1 followed by 1250mg  q24hr (eAUC 466) Reassess dosing with am labs Plan to obtain levels at steady state if therapy continued Will monitor for acute changes in renal function and adjust as needed F/u cultures results and de-escalate as appropriate   Height: 5\' 10"  (177.8 cm) Weight: 70.3 kg (155 lb) IBW/kg (Calculated) : 73  Temp (24hrs), Avg:98.4 F (36.9 C), Min:98.2 F (36.8 C), Max:98.6 F (37 C)  No results for input(s): WBC, CREATININE, LATICACIDVEN, VANCOTROUGH, VANCOPEAK, VANCORANDOM, GENTTROUGH, GENTPEAK, GENTRANDOM, TOBRATROUGH, TOBRAPEAK, TOBRARND, AMIKACINPEAK, AMIKACINTROU, AMIKACIN in the last 168 hours.  Estimated Creatinine Clearance: 60.2 mL/min (by C-G formula based on SCr of 0.94 mg/dL).    Allergies  Allergen Reactions   Oxycodone Anaphylaxis   Iodinated Diagnostic Agents Hives    alleric to renografin,isovue & omnipaque, hives, requires 13 hr prep//a.calhoun, Onset Date: [id number]      Iodine Hives and Rash    alleric to renografin,isovue & omnipaque, hives, requires 13 hr prep//a.calhoun, Onset Date: [id number] allergic to renografin,isovue & omnipaque, hives, requires 13 hr prep//a.calhoun, Onset Date: [id number]   Linezolid Hives and Rash        Methadone Hcl Other (See Comments)    hallucinations    Promethazine Other (See Comments)    Delirium (pulled out IV), erratic behavior Mental status change     Isovue [Iopamidol] Hives   Morphine Sulfate Other (See Comments)    Unknown reaction   Omnipaque [Iohexol] Hives    Code: HIVES, Desc: alleric to renografin,isovue & omnipaque, hives, requires 13 hr prep//a.calhoun, Onset Date: [id number]    Quetiapine Other (See Comments)    Unknown reaction    Renografin [Diatrizoate] Hives   Statins Other (See Comments)    Muscle weakness   Zolpidem Other (See Comments)    Erratic behavior/delusions Altered mental status   Atorvastatin Itching and Other (See Comments)    Tired, weakness    Carbidopa-Levodopa Anxiety   Chlorhexidine Gluconate [Chlorhexidine] Hives and Rash   Clindamycin Rash   Colesevelam Other (See Comments)    tired    Doxazosin Rash   Lovastatin Other (See Comments)    Tired, nervousness    Rosuvastatin Other (See Comments)    Tired, weakness     Antimicrobials this admission: Vancomycin 10/21 >>    Dose adjustments this admission: none  Microbiology results: 10/21 Wound culture: IP 9/23 MRSA PCR: postive  Thank you for allowing pharmacy to be a part of this patient's care.  Donnald Garre, PharmD Clinical Pharmacist  Please check AMION for all [place] numbers After 10:00 PM, call [place] [phone]

## 2021-01-26 NOTE — Interval H&P Note (Signed)
History and Physical Interval Note:  01/26/2021 4:15 PM  Jason Hartman  has presented today for surgery, with the diagnosis of left hip post operative wound drainage.  The various methods of treatment have been discussed with the patient and family. After consideration of risks, benefits and other options for treatment, the patient has consented to  Procedure(s) with comments: IRRIGATION AND DEBRIDEMENT LEFT HIP (Left) - 30 MIN APPLICATION OF WOUND VAC (Left) as a surgical intervention.  The patient's history has been reviewed, patient examined, no change in status, stable for surgery.  I have reviewed the patient's chart and labs.  Questions were answered to the patient's satisfaction.     Vanetta Mulders

## 2021-01-26 NOTE — Transfer of Care (Signed)
Immediate Anesthesia Transfer of Care Note  Patient: Jason Hartman  Procedure(s) Performed: IRRIGATION AND DEBRIDEMENT LEFT HIP (Left: Hip) APPLICATION OF WOUND VAC (Left: Hip)  Patient Location: PACU  Anesthesia Type:General  Level of Consciousness: drowsy, patient cooperative and responds to stimulation  Airway & Oxygen Therapy: Patient Spontanous Breathing and Patient connected to face mask oxygen  Post-op Assessment: Report given to RN and Post -op Vital signs reviewed and stable  Post vital signs: Reviewed and stable  Last Vitals:  Vitals Value Taken Time  BP 157/76 01/26/21 1740  Temp 36.8 C 01/26/21 1740  Pulse 94 01/26/21 1747  Resp 11 01/26/21 1747  SpO2 97 % 01/26/21 1747  Vitals shown include unvalidated device data.  Last Pain:  Vitals:   01/26/21 1740  TempSrc:   PainSc: Asleep         Complications: No notable events documented.

## 2021-01-26 NOTE — Brief Op Note (Signed)
   Brief Op Note  Date of Surgery: 01/26/2021  Preoperative Diagnosis: left hip post operative wound drainage  Postoperative Diagnosis: same  Procedure: Procedure(s): IRRIGATION AND DEBRIDEMENT LEFT HIP APPLICATION OF WOUND VAC  Implants: * No implants in log *  Surgeons: Surgeon(s): Vanetta Mulders, MD  Anesthesia: Choice    Estimated Blood Loss: See anesthesia record  Complications: None  Condition to PACU: Stable  Yevonne Pax, MD 01/26/2021 5:36 PM

## 2021-01-26 NOTE — H&P (Signed)
History and Physical    Jason Hartman FYB:[id number] DOB: February 08, 1936 DOA: 01/26/2021  PCP: [name], MD  Patient coming from: PACU, orthopedic office Chief Complaint: Hip pain  HPI: Jason Hartman is a 85 y.o. male with medical history significant of DM2, HTN, BPH, PUD, parkinson's disease, HLD, CKD 3a, PAF, anxiety who presented to the hospitalist service after surgical debridement of left hip this evening.  Patient presented 10/20 from [facility] to his orthopedic office with drainage from the site of hip hemiarthroplasty on the left (9/26).  The drainage started after a fall at the rehab and he was started on Abx for possible infection of the hip.  He underwent washout with orthopedics today and VAC was placed.  He was seen in the PACU and notes continued pain in the hip.  He otherwise notes that he is taking the medications they give him at [facility].  He has no other complaints.   ED Course: NA  Review of Systems: As per HPI otherwise all other systems reviewed and are negative.  Past Medical History:  Diagnosis Date   CAD (coronary artery disease)    s/p stents   Cellulitis and abscess of leg, except foot    Hypertension    Obstructive sleep apnea (adult) (pediatric)    Restless legs syndrome (RLS)    Thoracic or lumbosacral neuritis or radiculitis, unspecified    Type II or unspecified type diabetes mellitus without mention of complication, not stated as uncontrolled    Unspecified disease of pericardium    Unspecified hereditary and idiopathic peripheral neuropathy     Past Surgical History:  Procedure Laterality Date   ANAL FISSURE REPAIR     ANTERIOR APPROACH HEMI HIP ARTHROPLASTY Left 12/30/2020   Procedure: ANTERIOR APPROACH HIP ARTHROPLASTY;  Surgeon: Vanetta Mulders, MD;  Location: [place];  Service: Orthopedics;  Laterality: Left;   pericarditis  2009    Social History  reports that he quit smoking about 40 years ago. He has never used smokeless tobacco.  He reports current alcohol use. He reports that he does not use drugs.  Allergies  Allergen Reactions   Oxycodone Anaphylaxis   Iodinated Diagnostic Agents Hives    alleric to renografin,isovue & omnipaque, hives, requires 13 hr prep//a.calhoun, Onset Date: [id number]      Iodine Hives and Rash    alleric to renografin,isovue & omnipaque, hives, requires 13 hr prep//a.calhoun, Onset Date: [id number] allergic to renografin,isovue & omnipaque, hives, requires 13 hr prep//a.calhoun, Onset Date: [id number]   Linezolid Hives and Rash        Methadone Hcl Other (See Comments)    hallucinations    Promethazine Other (See Comments)    Delirium (pulled out IV), erratic behavior Mental status change     Isovue [Iopamidol] Hives   Morphine Sulfate Other (See Comments)    Unknown reaction   Omnipaque [Iohexol] Hives    Code: HIVES, Desc: alleric to renografin,isovue & omnipaque, hives, requires 13 hr prep//a.calhoun, Onset Date: [id number]    Quetiapine Other (See Comments)    Unknown reaction   Renografin [Diatrizoate] Hives   Statins Other (See Comments)    Muscle weakness   Zolpidem Other (See Comments)    Erratic behavior/delusions Altered mental status   Atorvastatin Itching and Other (See Comments)    Tired, weakness    Carbidopa-Levodopa Anxiety   Chlorhexidine Gluconate [Chlorhexidine] Hives and Rash   Clindamycin Rash   Colesevelam Other (See Comments)    tired    Doxazosin  Rash   Lovastatin Other (See Comments)    Tired, nervousness    Rosuvastatin Other (See Comments)    Tired, weakness     Family History  Problem Relation Age of Onset   Diabetes Father   He is unable to provide any further history at this time.   Prior to Admission medications   Medication Sig Start Date End Date Taking? Authorizing Provider  acetaminophen (TYLENOL) 500 MG tablet Take 1,000 mg by mouth every 8 (eight) hours.   Yes [provider]         clonazePAM (KLONOPIN) 0.25 MG  disintegrating tablet Take 1 tablet (0.25 mg total) by mouth every morning. 01/09/21  Yes Amin, Jeanella Flattery, MD  ferrous sulfate 325 (65 FE) MG EC tablet Take 325 mg by mouth every morning. 05/28/18  Yes [provider]  gabapentin (NEURONTIN) 300 MG capsule Take 1 capsule (300 mg total) by mouth at bedtime. 09/25/20  Yes Mercy Riding, MD  LANTUS SOLOSTAR 100 UNIT/ML Solostar Pen Inject 8 Units into the skin 2 (two) times daily. 09/25/20  Yes Gonfa, Charlesetta Ivory, MD  melatonin 5 MG TABS Take 5 mg by mouth at bedtime.   Yes [provider]  metFORMIN (GLUCOPHAGE-XR) 500 MG 24 hr tablet Take 500 mg by mouth daily with breakfast. 01/17/20  Yes [provider]  OLANZapine (ZYPREXA) 2.5 MG tablet Take 1 tablet (2.5 mg total) by mouth at bedtime. 01/08/21  Yes Amin, Ankit Chirag, MD  polyethylene glycol powder (GLYCOLAX/MIRALAX) 17 GM/SCOOP powder Take 17 g by mouth every morning. Mix in 8 oz water and drink   Yes [provider]  Rotigotine (NEUPRO) 8 MG/24HR PT24 Place 1 patch onto the skin at bedtime. For Parkinson's   Yes [provider]  Semaglutide,0.25 or 0.5MG /DOS, (OZEMPIC, 0.25 OR 0.5 MG/DOSE,) 2 MG/1.5ML SOPN Inject 0.5 mg into the skin every Tuesday.   Yes [provider]  thiamine 100 MG tablet Take 1 tablet (100 mg total) by mouth daily. 09/26/20  Yes Mercy Riding, MD  Acidophilus Lactobacillus CAPS Take 1 capsule by mouth every evening.    [provider]  amiodarone (PACERONE) 200 MG tablet Take 1 tablet (200 mg total) by mouth daily. 09/26/20   Mercy Riding, MD  apixaban (ELIQUIS) 5 MG TABS tablet Take 1 tablet (5 mg total) by mouth 2 (two) times daily. Patient not taking: Reported on 12/29/2020 09/29/20 10/29/20  Mercy Riding, MD  clonazePAM (KLONOPIN) 0.5 MG tablet Take 1 tablet (0.5 mg total) by mouth at bedtime. 01/08/21   Amin, Jeanella Flattery, MD  famotidine (PEPCID) 20 MG tablet Take 20 mg by mouth every morning.    [provider]  HYDROcodone-acetaminophen (NORCO/VICODIN) 5-325 MG tablet Take 1-2 tablets by mouth every 4 (four) hours as needed for severe pain. 01/08/21   Amin, Jeanella Flattery, MD  methocarbamol (ROBAXIN) 500 MG tablet Take 1 tablet (500 mg total) by mouth every 6 (six) hours as needed for muscle spasms. 01/08/21   Amin, Jeanella Flattery, MD  metoprolol tartrate (LOPRESSOR) 25 MG tablet Take 0.5 tablets (12.5 mg total) by mouth 2 (two) times daily. 09/25/20   Mercy Riding, MD  [facility] powder Apply 1 application topically in the morning and at bedtime. To rash on back and groin and sacrum 10/18/20   [provider]  OLANZapine (ZYPREXA) 2.5 MG tablet Take 1 tablet (2.5 mg total) by mouth 2 (two) times daily as needed (agitation threatening pt or  staff safety, hallucinations). 01/08/21   Amin, Ankit Chirag, MD  senna-docusate (SENOKOT-S) 8.6-50 MG tablet Take 1 tablet by mouth 2 (two) times daily as needed for moderate constipation. 09/25/20   Mercy Riding, MD  traMADol (ULTRAM) 50 MG tablet Take 1.5 tablets (75 mg total) by mouth 2 (two) times daily. 01/08/21   Damita Lack, MD    Physical Exam: Vitals:   01/26/21 1825 01/26/21 1840 01/26/21 1910 01/26/21 1929  BP: 139/75 (!) 141/71 128/70   Pulse: 99 97 (!) 101   Resp: 12 14 14    Temp:  98.3 F (36.8 C)  98.3 F (36.8 C)  TempSrc:      SpO2: 99% 94% 94%   Weight:      Height:        Constitutional: Lying in bed, in PACU, continuously asking why he is in pain Eyes: lids and conjunctivae normal, no conjunctival injection ENMT: Mucous membranes are moist, neck is supple Respiratory: CTAB, no wheezing or crackles Cardiovascular: RR, NR, no murmur noted, no peripheral edema noted Abdomen: +BS, NT, ND Musculoskeletal: No clubbing, normal tone and bulk for age, he has a wound vac in place to the left hip laterally and posteriorly Skin: no rashes, lesions, ulcers on exposed skin.  Surgical site noted Psychiatric: Alert and oriented,  perseverating and focused on pain, mood is flat Neuro: Mild cogwheeling at upper extremities.  Flat facies.     Labs on Admission: No labs done since 10/6 (fall encounter in the ED).  Have ordered CBC and BMET to be done.   Previous blood work 10/6 Na 131 BUN/Cr 19/0.94 Ca 8.1  WBC 22.6 H/H 10/32  Assessment/Plan  Hip abrasion, infected - Underwent surgery today, wound vac in place - ID consulted per Orthopedics, will see in the AM - Vancomycin and Rocephin started - BC X 2 ordered - Unknown if wound culture sent from surgery, will need to discuss with surgery team - Pain control with tylenol, dilaudid, scheduled tramadol.  He is on 4gm scheduled tylenol, so will not order Norco at this time.  - Also on methocarbamol and gabapentin  Essential hypertension - Continue home meds of metoprolol, monitor need for further blood pressure medication    Parkinsonism (HCC) - Mild cogwheeling on exam - Continue rotigotine patch    Panic disorder without agoraphobia - Continue home clonazepam, zyprexa - Consider starting an SSRI if needed    GERD (gastroesophageal reflux disease) - Continue home pepcid, no complaints today    Diabetes type 2 Diabetic peripheral neuropathy ([place]) - Hold home metformin and semaglutide - Decrease lantus to 4units BID - SSI - Monitor for intake of diet and increase insulin as needed - Check A1C    Chronic kidney disease, stage 3a ([place]) - Most recent labs show a GFR of > 60 - Will repeat - Avoid nephrotoxins    Slow transit constipation - Given he will be on narcotics, will start a bowel regimen    PAF (paroxysmal atrial fibrillation) (HCC) - Hold apixaban given surgery - Heparin SQ for DVT PPx - Continue amiodarone and metoprolol  Mild Hyponatremia - Recheck Na  DVT prophylaxis: Heparin SQ, hold apixaban in the setting of recent surgery  Code Status:   Code  Family Communication:  Wife Cathy in the waiting room  Disposition Plan:    Patient is from:  [place] to:  Same  Anticipated DC date:  01/29/21  Anticipated DC barriers: Pain control, course of  therapy  Consults called:  Orthopedics, ID Admission status:  IP, med surg  Severity of Illness: The appropriate patient status for this patient is INPATIENT. Inpatient status is judged to be reasonable and necessary in order to provide the required intensity of service to ensure the patient's safety. The patient's presenting symptoms, physical exam findings, and initial radiographic and laboratory data in the context of their chronic comorbidities is felt to place them at high risk for further clinical deterioration. Furthermore, it is not anticipated that the patient will be medically stable for discharge from the hospital within 2 midnights of admission.   * I certify that at the point of admission it is my clinical judgment that the patient will require inpatient hospital care spanning beyond 2 midnights from the point of admission due to high intensity of service, high risk for further deterioration and high frequency of surveillance required.Gilles Chiquito MD [facility]  How to contact the [facility] Attending or Consulting provider [place] or covering provider during after hours [place], for this patient?   Check the care team in [facility] and look for a) attending/consulting [facility] provider listed and b) the [facility] team listed Log into [email or web address] and use [place]'s universal password to access. If you do not have the password, please contact the hospital operator. Locate the [facility] provider you are looking for under [facility] and page to a number that you can be directly reached. If you still have difficulty reaching the provider, please page the [facility] (Director on Call) for the Hospitalists listed on amion for assistance.  01/26/2021, 7:49 PM

## 2021-01-26 NOTE — Op Note (Signed)
   Date of Surgery: 01/26/2021  INDICATIONS: Mr. Lal is a 85 y.o.-year-old male status post left hip hemiarthroplasty which is subsequently complicated by wound drainage and likely infection.  He was doing very well in terms of his left hip hemiarthroplasty which was done on September 24 until he had a direct fall onto the wound.  Following this there was subsequently drainage.  This drainage began approximately 6 days prior.  He denies any fevers or chills.  Presented to my office at which time I recommended urgent debridement..  The risk and benefits of the procedure with discussed in detail and documented in the pre-operative evaluation.  PREOPERATIVE DIAGNOSIS: 1.  Left hip hemiarthroplasty drainage and likely wound infection  POSTOPERATIVE DIAGNOSIS: Same.  PROCEDURE: Irrigation and debridement of muscle fascia bone left hip hemiarthroplasty wound measuring 15 cm  SURGEON: Yevonne Pax MD  ASSISTANT: Shirley Friar, ATC; necessary for the timely completion of procedure and due to complexity of procedure.  ANESTHESIA:  general  IV FLUIDS AND URINE: See anesthesia record.  ANTIBIOTICS: Ancef 2 g  ESTIMATED BLOOD LOSS: 25 none mL.  IMPLANTS:  * No implants in log *  DRAINS: None  CULTURES: 3 times cultures sent for aerobic and aerobic aerobic  COMPLICATIONS: none  DESCRIPTION OF PROCEDURE:  The patient was identified in the preoperative holding area and the correct site was marked according to universal protocol.  He is subsequently taken back to the operating room where anesthesia was induced.  He was positioned in the supine position with a large bump under the hip.  He was prepped and draped in the usual sterile fashion.  Again a timeout was held.  We open his incision essentially using Metzenbaum scissors.  Upon this there was a large amount of liquefied type fat that was draining from the wound.  This probes deep to the IT band.  There was a small rent in the IT band.   This was further opened and irrigated thoroughly with 9 L of normal saline.  A roguer was used to debride exposed bone as well as IT band.  15 blade was used to remove IT band that appeared nonviable.  We subsequently irrigated the remainder of the wound with 6 L of normal saline.  Sharp dissection and excision was performed with a 15 blade and the subcutaneous tissue to remove all nonviable tissue.  Following this we are happy with the margins of the debridement we subsequently placed 0 PDS in the IT band.  We closed in layers with 2-0 PDS 3-0 nylon in vertical mattress fashion and wound VAC for the dressing.    Shirley Friar ATC was necessary for opening, closing, retracting, limb positioning and overall facilitation and timely completion of the procedure.     POSTOPERATIVE PLAN: He will continue to be weightbearing as tolerated with the wound VAC in place.  He will be placed on antibiotics.  I will consult with the medical service to provide the highest level of care for him.  We will consult ID for likely infection and follow-up his cultures  Yevonne Pax, MD 5:38 PM

## 2021-01-26 NOTE — Anesthesia Procedure Notes (Signed)
Procedure Name: LMA Insertion Date/Time: 01/26/2021 4:35 PM Performed by: Jearld Pies, CRNA Pre-anesthesia Checklist: Patient identified, Emergency Drugs available, Suction available and Patient being monitored Patient Re-evaluated:Patient Re-evaluated prior to induction Oxygen Delivery Method: Circle System Utilized Preoxygenation: Pre-oxygenation with 100% oxygen Induction Type: IV induction Ventilation: Mask ventilation without difficulty LMA: LMA inserted LMA Size: 4.0 Number of attempts: 1 Airway Equipment and Method: Bite block Placement Confirmation: positive ETCO2 Tube secured with: Tape Dental Injury: Teeth and Oropharynx as per pre-operative assessment

## 2021-01-27 DIAGNOSIS — T8452XD Infection and inflammatory reaction due to internal left hip prosthesis, subsequent encounter: Secondary | ICD-10-CM | POA: Diagnosis not present

## 2021-01-27 DIAGNOSIS — I480 Paroxysmal atrial fibrillation: Secondary | ICD-10-CM

## 2021-01-27 DIAGNOSIS — G20 Parkinson's disease: Secondary | ICD-10-CM

## 2021-01-27 DIAGNOSIS — N1831 Chronic kidney disease, stage 3a: Secondary | ICD-10-CM | POA: Diagnosis not present

## 2021-01-27 DIAGNOSIS — K219 Gastro-esophageal reflux disease without esophagitis: Secondary | ICD-10-CM

## 2021-01-27 DIAGNOSIS — T8452XA Infection and inflammatory reaction due to internal left hip prosthesis, initial encounter: Secondary | ICD-10-CM | POA: Diagnosis not present

## 2021-01-27 DIAGNOSIS — A499 Bacterial infection, unspecified: Secondary | ICD-10-CM

## 2021-01-27 DIAGNOSIS — E1142 Type 2 diabetes mellitus with diabetic polyneuropathy: Secondary | ICD-10-CM

## 2021-01-27 DIAGNOSIS — Z1612 Extended spectrum beta lactamase (ESBL) resistance: Secondary | ICD-10-CM

## 2021-01-27 DIAGNOSIS — I10 Essential (primary) hypertension: Secondary | ICD-10-CM

## 2021-01-27 DIAGNOSIS — Z96649 Presence of unspecified artificial hip joint: Secondary | ICD-10-CM

## 2021-01-27 LAB — BASIC METABOLIC PANEL
Anion gap: 8 (ref 5–15)
BUN: 22 mg/dL (ref 8–23)
CO2: 24 mmol/L (ref 22–32)
Calcium: 8.5 mg/dL — ABNORMAL LOW (ref 8.9–10.3)
Chloride: 100 mmol/L (ref 98–111)
Creatinine, Ser: 0.99 mg/dL (ref 0.61–1.24)
GFR, Estimated: >60 mL/min (ref >60)
Glucose, Bld: 215 mg/dL — ABNORMAL HIGH (ref 70–99)
Potassium: 5.7 mmol/L — ABNORMAL HIGH (ref 3.5–5.1)
Sodium: 132 mmol/L — ABNORMAL LOW (ref 135–145)

## 2021-01-27 LAB — CBC
HCT: 27.8 % — ABNORMAL LOW (ref 39.0–52.0)
Hemoglobin: 8.5 g/dL — ABNORMAL LOW (ref 13.0–17.0)
MCH: 28 pg (ref 26.0–34.0)
MCHC: 30.6 g/dL (ref 30.0–36.0)
MCV: 91.4 fL (ref 80.0–100.0)
Platelets: 305 10*3/uL (ref 150–400)
RBC: 3.04 MIL/uL — ABNORMAL LOW (ref 4.22–5.81)
RDW: 14.9 % (ref 11.5–15.5)
WBC: 11.3 10*3/uL — ABNORMAL HIGH (ref 4.0–10.5)
nRBC: 0 % (ref 0.0–0.2)

## 2021-01-27 LAB — GLUCOSE, CAPILLARY
Glucose-Capillary: 161 mg/dL — ABNORMAL HIGH (ref 70–99)
Glucose-Capillary: 187 mg/dL — ABNORMAL HIGH (ref 70–99)
Glucose-Capillary: 144 mg/dL — ABNORMAL HIGH (ref 70–99)
Glucose-Capillary: 168 mg/dL — ABNORMAL HIGH (ref 70–99)

## 2021-01-27 LAB — HEMOGLOBIN A1C
Hgb A1c MFr Bld: 6.8 % — ABNORMAL HIGH (ref 4.8–5.6)
Mean Plasma Glucose: 148.46 mg/dL

## 2021-01-27 MED ORDER — SODIUM CHLORIDE 0.9 % IV SOLN
2.0000 g | Freq: Three times a day (TID) | INTRAVENOUS | Status: DC
  Administered 2021-01-27 – 2021-01-31 (×12): 2 g via INTRAVENOUS
  Filled 2021-01-27 (×17): qty 2

## 2021-01-27 MED ORDER — SODIUM POLYSTYRENE SULFONATE 15 GM/60ML PO SUSP
30.0000 g | Freq: Once | ORAL | Status: AC
  Administered 2021-01-27: 30 g via ORAL
  Filled 2021-01-27: qty 120

## 2021-01-27 NOTE — Progress Notes (Signed)
Pharmacy Antibiotic Note  Jason Hartman is a 85 y.o. male admitted on 01/26/2021 with  L hip prosthetic joint infection/osteomyelitis . The patient is now s/p I&D of L hip hemiarthroplasty wound on 10/21, likely not a candidate for resection arthroplasty. The patient has a history of MRSA and ESBL and is being seen by ID. Pharmacy has been consulted for meropenem and vancomycin dosing.  Plan: -Initiate meropenem IV 2g Q8H -Continue vancomycin IV 1250 mg Q24H (eAUC 460, goal 400-550) - Scr used 0.99, Vd 0.72 -Monitor clinical status, renal function, ID/ortho recommendations, and length of therapy  -Obtain vancomycin peaks and troughs as clinically indicated  Height: 5\' 10"  (177.8 cm) Weight: 70.3 kg (155 lb) IBW/kg (Calculated) : 73  Temp (24hrs), Avg:98.1 F (36.7 C), Min:97.4 F (36.3 C), Max:98.6 F (37 C)  Recent Labs  Lab 01/26/21 2140 01/27/21 0108  WBC  --  11.3*  CREATININE 1.07 0.99    Estimated Creatinine Clearance: 57.2 mL/min (by C-G formula based on SCr of 0.99 mg/dL).    Allergies  Allergen Reactions   Oxycodone Anaphylaxis   Iodinated Diagnostic Agents Hives    alleric to renografin,isovue & omnipaque, hives, requires 13 hr prep//a.calhoun, Onset Date: [id number]      Iodine Hives and Rash    alleric to renografin,isovue & omnipaque, hives, requires 13 hr prep//a.calhoun, Onset Date: [id number] allergic to renografin,isovue & omnipaque, hives, requires 13 hr prep//a.calhoun, Onset Date: [id number]   Linezolid Hives and Rash        Methadone Hcl Other (See Comments)    hallucinations    Promethazine Other (See Comments)    Delirium (pulled out IV), erratic behavior Mental status change     Isovue [Iopamidol] Hives   Methadone    Morphine Sulfate Other (See Comments)    Unknown reaction   Omnipaque [Iohexol] Hives    Code: HIVES, Desc: alleric to renografin,isovue & omnipaque, hives, requires 13 hr prep//a.calhoun, Onset Date: [id number]    Quetiapine  Other (See Comments)    Unknown reaction   Renografin [Diatrizoate] Hives   Statins Other (See Comments)    Muscle weakness   Zolpidem Other (See Comments)    Erratic behavior/delusions Altered mental status   Atorvastatin Itching and Other (See Comments)    Tired, weakness    Carbidopa-Levodopa Anxiety   Chlorhexidine Gluconate [Chlorhexidine] Hives and Rash   Clindamycin Rash   Colesevelam Other (See Comments)    tired    Doxazosin Rash   Lovastatin Other (See Comments)    Tired, nervousness    Rosuvastatin Other (See Comments)    Tired, weakness     Antimicrobials this admission: 10/21 cefazolin x1 dose 10/21 vancomycin >> 10/22 ceftriaxone x1 dose 10/22 meropenem >>   Dose adjustments this admission: N/A  Microbiology results: 10/21 AFB: sent 10/21 Wound Cx: sent 10/21 Fungus: sent  Thank you for allowing pharmacy to be a part of this patient's care.  Shauna Hugh, PharmD, [place]  PGY-2 Pharmacy Resident 01/27/2021 11:04 AM  Please check [email or web address] for unit-specific pharmacy phone numbers.

## 2021-01-27 NOTE — Progress Notes (Signed)
A consult was placed to IV therapy to restart the pt's iv;  pt has a palpable vein in the R lat forearm, however he is too anxious and nervous; vein collapsed;  saw the same vein using ultrasound, however, pt remains too nervous to do a 2nd attempt;  RN aware;  will see if pt has a med for anxiety ordered before any other attempts are made.

## 2021-01-27 NOTE — Evaluation (Signed)
Occupational Therapy Evaluation Patient Details Name: RAYMOND BHARDWAJ MRN: [id number] DOB: [birth year] Today's Date: 01/27/2021   History of Present Illness Pt. is 85 yr old M s/p L ant/lat hip hemiarthroplasty admitted on 10/21 with inc drainage from recent L hip incision. Pt. was recovering well at rehab when he experienced a fall OOB and has since been having inc drainage. Underwent I + D of L hip on 10/21 with placement of wound vac. PMH: CAD, cellulitis, HTN, OSA, DM, neuropathy, radiculopathy, PD   Clinical Impression   Pt presents with decreased balance, strength, and activity tolerance as well as significant L hip pain. Pt currently requiring Mod - Max A with most ADLs and unable to meaningfully attempt functional transfer during eval. Based on performance, pt may benefit from +2 assist for functional transfers/mobility at this time. Pt reports that he was independent prior to [facility]A, however was receiving therapy at SNF and needed assistance with ADLs PTA. Pt would likely benefit from return to SNF for further rehab at d/c. Will follow acutely.     Recommendations for follow up therapy are one component of a multi-disciplinary discharge planning process, led by the attending physician.  Recommendations may be updated based on patient status, additional functional criteria and insurance authorization.   Follow Up Recommendations  SNF    Equipment Recommendations  Other (comment) (TBD at next venue of care)    Recommendations for Other Services       Precautions / Restrictions Precautions Precautions: Anterior Hip Precaution Comments: Reviewed precautions with patient, demos partial understanding Restrictions Weight Bearing Restrictions: Yes LLE Weight Bearing: Weight bearing as tolerated      Mobility Bed Mobility Overal bed mobility: Needs Assistance Bed Mobility: Supine to Sit;Sit to Supine;Rolling Rolling: Mod assist   Supine to sit: Max assist Sit to supine: Max  assist   General bed mobility comments: Assist with managing LLE and raising trunk.    Transfers Overall transfer level: Needs assistance               General transfer comment: Attempted stand pivot transfer, however pt calling out in pain and stating that he "can't do it" after barely rising from EOB with Max A. Pt states that it "hurts too much" to bear any weight through LLE.    Balance Overall balance assessment: Needs assistance Sitting-balance support: Single extremity supported;Feet supported Sitting balance-Leahy Scale: Zero Sitting balance - Comments: Pt leaned onto R side while sitting EOB and could not maintain upright sitting position even with assistance due to pain in L hip.                                   ADL either performed or assessed with clinical judgement   ADL Overall ADL's : Needs assistance/impaired Eating/Feeding: Independent   Grooming: Set up;Bed level   Upper Body Bathing: Moderate assistance   Lower Body Bathing: Maximal assistance   Upper Body Dressing : Moderate assistance   Lower Body Dressing: Maximal assistance                 General ADL Comments: Pt is limited by pain, balance, activity tolerance, and weakness. Pt likely requiring Mod - Max A +2 for functional transfers based on performance during eval.     Vision Baseline Vision/History: 1 Wears glasses       Perception     Praxis      Pertinent Vitals/Pain  Pain Assessment: 0-10 Pain Score: 10-Worst pain ever Pain Location: L hip Pain Descriptors / Indicators: Aching;Tender;Throbbing;Discomfort Pain Intervention(s): Limited activity within patient's tolerance;Repositioned;Patient requesting pain meds-RN notified;Monitored during session     Hand Dominance Right   Extremity/Trunk Assessment Upper Extremity Assessment Upper Extremity Assessment: Generalized weakness;LUE deficits/detail LUE Deficits / Details: Reports his left shoulder is  "disintegrated" and that he needs it replaced. Noted instability and limited ROM/strength.   Lower Extremity Assessment Lower Extremity Assessment: Defer to PT evaluation LLE Deficits / Details: Grossly 2-/5       Communication Communication Communication: No difficulties   Cognition Arousal/Alertness: Awake/alert Behavior During Therapy: Anxious Overall Cognitive Status: Within Functional Limits for tasks assessed                                    General Comments       Exercises Other Exercises Other Exercises: Pt. educated on hip precautions.   Shoulder Instructions      Home Living Family/patient expects to be discharged to:: Skilled nursing facility Living Arrangements: Spouse/significant other Available Help at Discharge: Family Type of Home: House                       Home Equipment: Gilford Rile - 2 wheels          Prior Functioning/Environment Level of Independence: Needs assistance  Gait / Transfers Assistance Needed: Has been working on amb in halls with RW and min A in rehab; prior to [facility] was independent ADL's / Homemaking Assistance Needed: Requires assist with ADLs            OT Problem List: Decreased strength;Decreased range of motion;Decreased activity tolerance;Impaired balance (sitting and/or standing);Decreased safety awareness;Decreased knowledge of use of DME or AE;Decreased knowledge of precautions;Pain      OT Treatment/Interventions: Self-care/ADL training;Therapeutic exercise;Neuromuscular education;Energy conservation;DME and/or AE instruction;Therapeutic activities;Patient/family education;Balance training    OT Goals(Current goals can be found in the care plan section) Acute Rehab OT Goals Patient Stated Goal: Pt's goal is to decrease pain. OT Goal Formulation: With patient Time For Goal Achievement: 02/10/21 Potential to Achieve Goals: Good  OT Frequency: Min 2X/week   Barriers to D/C:             Co-evaluation              AM-PAC OT "6 Clicks" Daily Activity     Outcome Measure Help from another person eating meals?: None Help from another person taking care of personal grooming?: A Little Help from another person toileting, which includes using toliet, bedpan, or urinal?: A Lot Help from another person bathing (including washing, rinsing, drying)?: A Lot Help from another person to put on and taking off regular upper body clothing?: A Lot Help from another person to put on and taking off regular lower body clothing?: A Lot 6 Click Score: 15   End of Session Equipment Utilized During Treatment: Gait belt Nurse Communication: Mobility status;Patient requests pain meds  Activity Tolerance: Patient limited by pain Patient left: in bed;with call bell/phone within reach;with bed alarm set  OT Visit Diagnosis: Unsteadiness on feet (R26.81);Other abnormalities of gait and mobility (R26.89);History of falling (Z91.81);Muscle weakness (generalized) (M62.81);Pain                Time: 2094-7096 OT Time Calculation (min): 29 min Charges:  OT General Charges $OT Visit: 1 Visit OT Evaluation $OT Eval Moderate  Complexity: 1 Mod OT Treatments $Therapeutic Activity: 8-22 mins  Mohamed Portlock C, OT/L  Acute Rehab [place] 01/27/2021, 2:31 PM

## 2021-01-27 NOTE — Progress Notes (Signed)
   Subjective:  Patient reports pain as mild, overall much better today . Seen and evaluated by ID  Objective:   VITALS:   Vitals:   01/26/21 1950 01/27/21 0004 01/27/21 0515 01/27/21 0744  BP: 127/73 118/64 128/65 129/69  Pulse: 100 66 60 64  Resp: 14 15 17 16   Temp: 98.4 F (36.9 C) 98 F (36.7 C) 97.8 F (36.6 C) (!) 97.4 F (36.3 C)  TempSrc: Oral Oral Oral Oral  SpO2: 97% 98% 98% 99%  Weight:      Height:        Neurologically intact Neurovascular intact Intact pulses distally Vac in place holding suction  Lab Results  Component Value Date   WBC 11.3 (H) 01/27/2021   HGB 8.5 (L) 01/27/2021   HCT 27.8 (L) 01/27/2021   MCV 91.4 01/27/2021   PLT 305 01/27/2021     Assessment/Plan:  1 Day Post-Op   - Expected postop acute blood loss anemia - will monitor for symptoms - Patient to work with PT/OT to optimize mobilization safely - DVT ppx - SCDs, ambulation, heparin 5000 TID - Appreciate ID recs, fu cultures - WBAT operative extremity - Pain control - multimodal pain management, ATC acetaminophen in conjunction with as needed narcotic (oxycodone), although this should be minimized with other modalities    Zahi Plaskett 01/27/2021, 12:31 PM

## 2021-01-27 NOTE — Progress Notes (Signed)
PROGRESS NOTE    Jason Hartman  GGE:[id number] DOB: 02-24-1936 DOA: 01/26/2021 PCP: [name], MD    Brief Narrative:  Jason Hartman is a 85 y.o. male with medical history significant of DM2, HTN, BPH, PUD, parkinson's disease, HLD, CKD 3a, PAF, anxiety who presented to the hospitalist service after surgical debridement of left hip this evening.  Patient presented 10/20 from [facility] to his orthopedic office with drainage from the site of hip hemiarthroplasty on the left (9/26).  The drainage started after a fall at the rehab and he was started on Abx for possible infection of the hip.  He underwent washout with orthopedics today and VAC was placed.  He was seen in the PACU and notes continued pain in the hip.  He otherwise notes that he is taking the medications they give him at [facility].   Consultants:  ID, orthopedics  Procedures:   Antimicrobials:  Meropenem and vancomycin   Subjective: Has multiple complaints.  Complaining of pain and he is not getting his pain meds on time.  Complaining of his breakfast not being changed?  No other complaints  Objective: Vitals:   01/26/21 1950 01/27/21 0004 01/27/21 0515 01/27/21 0744  BP: 127/73 118/64 128/65 129/69  Pulse: 100 66 60 64  Resp: 14 15 17 16   Temp: 98.4 F (36.9 C) 98 F (36.7 C) 97.8 F (36.6 C) (!) 97.4 F (36.3 C)  TempSrc: Oral Oral Oral Oral  SpO2: 97% 98% 98% 99%  Weight:      Height:        Intake/Output Summary (Last 24 hours) at 01/27/2021 0808 Last data filed at 01/27/2021 4650 Gross per 24 hour  Intake 3082 ml  Output 825 ml  Net 2257 ml   Filed Weights   01/26/21 1226  Weight: 70.3 kg    Examination:  General exam:  nad , foul mood Respiratory system: Clear to auscultation. Respiratory effort normal. Cardiovascular system: S1 & S2 heard, RRR. No JVD, murmurs, rubs, gallops or clicks.  Gastrointestinal system: Abdomen is nondistended, soft and nontender.Normal bowel sounds  heard. Central nervous system: Grossly intact  Extremities: No edema Psychiatry:Mood & affect appropriate in current setting.     Data Reviewed: I have personally reviewed following labs and imaging studies  CBC: Recent Labs  Lab 01/27/21 0108  WBC 11.3*  HGB 8.5*  HCT 27.8*  MCV 91.4  PLT 354   Basic Metabolic Panel: Recent Labs  Lab 01/26/21 2140 01/27/21 0108  NA 129* 132*  K 5.1 5.7*  CL 98 100  CO2 21* 24  GLUCOSE 220* 215*  BUN 20 22  CREATININE 1.07 0.99  CALCIUM 8.4* 8.5*   GFR: Estimated Creatinine Clearance: 57.2 mL/min (by C-G formula based on SCr of 0.99 mg/dL). Liver Function Tests: No results for input(s): AST, ALT, ALKPHOS, BILITOT, PROT, ALBUMIN in the last 168 hours. No results for input(s): LIPASE, AMYLASE in the last 168 hours. No results for input(s): AMMONIA in the last 168 hours. Coagulation Profile: No results for input(s): INR, PROTIME in the last 168 hours. Cardiac Enzymes: No results for input(s): CKTOTAL, CKMB, CKMBINDEX, TROPONINI in the last 168 hours. BNP (last 3 results) No results for input(s): PROBNP in the last 8760 hours. HbA1C: Recent Labs    01/27/21 0108  HGBA1C 6.8*   CBG: Recent Labs  Lab 01/26/21 1225 01/26/21 1741 01/26/21 2213 01/27/21 0805  GLUCAP 132* 140* 219* 161*   Lipid Profile: No results for input(s): CHOL, HDL,  LDLCALC, TRIG, CHOLHDL, LDLDIRECT in the last 72 hours. Thyroid Function Tests: No results for input(s): TSH, T4TOTAL, FREET4, T3FREE, THYROIDAB in the last 72 hours. Anemia Panel: No results for input(s): VITAMINB12, FOLATE, FERRITIN, TIBC, IRON, RETICCTPCT in the last 72 hours. Sepsis Labs: No results for input(s): PROCALCITON, LATICACIDVEN in the last 168 hours.  No results found for this or any previous visit (from the past 240 hour(s)).       Radiology Studies: No results found.      Scheduled Meds:  acetaminophen  1,000 mg Oral Q6H   amiodarone  200 mg Oral Daily    clonazePAM  0.25 mg Oral Daily   clonazePAM  0.5 mg Oral QHS   docusate sodium  100 mg Oral BID   famotidine  20 mg Oral Daily   ferrous sulfate  325 mg Oral Q breakfast   gabapentin  300 mg Oral QHS   heparin  5,000 Units Subcutaneous Q8H   insulin aspart  0-5 Units Subcutaneous QHS   insulin aspart  0-9 Units Subcutaneous TID WC   insulin glargine-yfgn  4 Units Subcutaneous BID   melatonin  5 mg Oral QHS   metoprolol tartrate  12.5 mg Oral BID   OLANZapine  2.5 mg Oral QHS   Rotigotine  1 patch Transdermal QHS   sodium polystyrene  30 g Oral Once   thiamine  100 mg Oral Daily   traMADol  75 mg Oral BID   Continuous Infusions:  sodium chloride 125 mL/hr at 01/27/21 0608   cefTRIAXone (ROCEPHIN)  IV 2 g (01/27/21 0033)   vancomycin      Assessment & Plan:   Principal Problem:   Hip abrasion, infected Active Problems:   Essential hypertension   Parkinsonism (HCC)   Panic disorder without agoraphobia   GERD (gastroesophageal reflux disease)   Diabetic peripheral neuropathy (HCC)   Chronic kidney disease, stage 3a (HCC)   Slow transit constipation   PAF (paroxysmal atrial fibrillation) (HCC)   Left hip prosthetic joint infection  Status post I&D of left hip hemiarthroplasty wound Wound VAC in place Ortho following ID consulted-input was appreciated.  Continue vancomycin per pharmacy Given history of ESBL we will change from ceftriaxone to meropenem Follow-up surgical wound cultures Wound care Pain control    Essential hypertension Stable continue metoprolol    Parkinsonism (HCC) - Continue rotigotine patch   Panic disorder without agoraphobia -Continue clonazepam and Zyprexa     GERD (gastroesophageal reflux disease) - Continue home pepcid,     Diabetes type 2 Diabetic peripheral neuropathy ([place]) - Hold home metformin and semaglutide BG stable  continue Lantus Continue R-ISS - Check A1C    Chronic kidney disease, stage 3a (HCC) stable - Avoid  nephrotoxins    Slow transit constipation - Given he will be on narcotics, will start a bowel regimen    PAF (paroxysmal atrial fibrillation) (HCC) - Hold apixaban given surgery - Heparin SQ for DVT PPx - Continue amiodarone and metoprolol   Mild Hyponatremia - Recheck Na     DVT prophylaxis: Heparin Code Status: Full Family Communication: None at bedside Disposition Plan:  Status is: Inpatient  Remains inpatient appropriate because: Being treated with IV antibiotic and severity of his illness requiring hospitalization            LOS: 1 day   Time spent: 35 minutes with more than 50% on COC    Jason Hanlon, MD [facility] Pager [phone]  If 7PM-7AM, please contact night-coverage  01/27/2021, 8:08 AM

## 2021-01-27 NOTE — Plan of Care (Signed)
  Problem: Education: Goal: Required Educational Video(s) Outcome: Progressing   Problem: Clinical Measurements: Goal: Postoperative complications will be avoided or minimized Outcome: Progressing   Problem: Skin Integrity: Goal: Demonstration of wound healing without infection will improve Outcome: Progressing   

## 2021-01-27 NOTE — Evaluation (Signed)
Physical Therapy Evaluation Patient Details Name: Jason Hartman MRN: [id number] DOB: [birth year] Today's Date: 01/27/2021  History of Present Illness  Pt. is 85 yr old M s/p L ant/lat hip hemiarthroplasty admitted on 10/21 with inc drainage from recent L hip incision. Pt. was recovering well at rehab when he experienced a fall OOB and has since been having inc drainage. Underwent I + D of L hip on 10/21 with placement of wound vac. PMH: CAD, cellulitis, HTN, OSA, DM, neuropathy, radiculopathy, PD  Clinical Impression  Pt. Was recovering well from L ant/lat hip hemiarthroplasty in rehab when he had fall complicating recovery. Pt. Underwent I + D of L hip and is currently requiring mod A to roll and is currently refusing all other activity due to extreme pain in operative extremity.  Pt. Will require skilled PT in acute care to address dec activity tolerance, bed mobility and transfer difficulty, and initiate gait training.  Pt. Would benefit from return to rehab prior to transition home for safety.     Recommendations for follow up therapy are one component of a multi-disciplinary discharge planning process, led by the attending physician.  Recommendations may be updated based on patient status, additional functional criteria and insurance authorization.  Follow Up Recommendations SNF    Equipment Recommendations  Rolling walker with 5" wheels    Recommendations for Other Services       Precautions / Restrictions Precautions Precautions: Anterior Hip Precaution Comments: Reviewed precautions with patient, demos partial understanding Restrictions Weight Bearing Restrictions: Yes LLE Weight Bearing: Weight bearing as tolerated      Mobility  Bed Mobility Overal bed mobility: Needs Assistance Bed Mobility: Rolling Rolling: Mod assist         General bed mobility comments: Refuses any activity other than rolling in bed.  C/o pain on buttocks, but will not let PT assist him to chair  to change position. States he's not ready to get OOB.  PT offers to help him sit EOB breifly, but pt. refuses - wants to eat.  PT states she will assist him back when food arrives but he continues to refuse.  NSG comes in room to assist PT, but pt. will not trial activity.    Transfers                    Ambulation/Gait                Stairs            Wheelchair Mobility    Modified Rankin (Stroke Patients Only)       Balance                                             Pertinent Vitals/Pain Pain Assessment: 0-10 Pain Score: 8  Pain Location: C/o extreme pain in LE, NSG aware.  States he can only have IV dilaudid, but lost IV site Pain Descriptors / Indicators: Aching;Tender;Throbbing;Discomfort Pain Intervention(s): Limited activity within patient's tolerance;Monitored during session    [place] expects to be discharged to:: Skilled nursing facility (Pt. has been in rehab since recent ant/lat THA) Living Arrangements: Spouse/significant other Available Help at Discharge: Family Type of Home: House         Home Equipment: Gilford Rile - 2 wheels      Prior Function Level of Independence: Needs assistance  Gait / Transfers Assistance Needed: Has been working on amb in halls with RW and min A in rehab; prior to [place] was independent  ADL's / Homemaking Assistance Needed: Requires assist with ADLs        Hand Dominance        Extremity/Trunk Assessment   Upper Extremity Assessment Upper Extremity Assessment: Defer to OT evaluation    Lower Extremity Assessment Lower Extremity Assessment: LLE deficits/detail LLE Deficits / Details: Grossly 2-/5       Communication      Cognition Arousal/Alertness: Awake/alert Behavior During Therapy: Agitated;Anxious Overall Cognitive Status: Within Functional Limits for tasks assessed                                 General Comments: Pt. is highly  agitated and uncooperative.  Wants to eat and wants pain medicine.  Does not want to work with PT.  NSG made aware.      General Comments      Exercises Other Exercises Other Exercises: Pt. educated on hip precautions.   Assessment/Plan    PT Assessment Patient needs continued PT services  PT Problem List Decreased strength;Decreased mobility;Decreased range of motion;Decreased knowledge of precautions;Decreased activity tolerance;Decreased balance       PT Treatment Interventions Gait training;Balance training;Therapeutic exercise;DME instruction;Functional mobility training;Therapeutic activities;Patient/family education    PT Goals (Current goals can be found in the Care Plan section)  Acute Rehab PT Goals Patient Stated Goal: Pt's goal is to decrease pain. PT Goal Formulation: With patient Time For Goal Achievement: 02/10/21 Potential to Achieve Goals: Good    Frequency     Barriers to discharge        Co-evaluation               AM-PAC PT "6 Clicks" Mobility  Outcome Measure Help needed turning from your back to your side while in a flat bed without using bedrails?: A Lot Help needed moving from lying on your back to sitting on the side of a flat bed without using bedrails?: A Lot Help needed moving to and from a bed to a chair (including a wheelchair)?: A Lot Help needed standing up from a chair using your arms (e.g., wheelchair or bedside chair)?: A Lot Help needed to walk in hospital room?: A Lot Help needed climbing 3-5 steps with a railing? : A Lot 6 Click Score: 12    End of Session   Activity Tolerance: Patient limited by pain Patient left: in bed;with call bell/phone within reach;with bed alarm set Nurse Communication: Mobility status PT Visit Diagnosis: Other abnormalities of gait and mobility (R26.89);Repeated falls (R29.6);Pain Pain - part of body: Hip    Time: 1610-9604 PT Time Calculation (min) (ACUTE ONLY): 21 min   Charges:   PT  Evaluation $PT Eval Low Complexity: 1 Low         Dwight Adamczak A. Monick Rena, PT, DPT Acute Rehabilitation Services Office: [place] 01/27/2021, 11:34 AM

## 2021-01-27 NOTE — Progress Notes (Signed)
Arrived to 6n01 from PACU in bed. Wife at bedside. Pt denies pain/nausea at this time. Pt anxious but cooperative. Will continue to monitor.

## 2021-01-27 NOTE — Consult Note (Signed)
[name] for Infectious Disease    Date of Admission:  01/26/2021     Reason for Consult:Left hip PJI     Referring Physician: Dr Sammuel Hines  Current antibiotics: Vancomycin 10/21-present Ceftriaxone 10/21-present  Previous antibiotics: Cefazolin x1 10/21  ASSESSMENT:    85 y.o. male admitted with:  Left hip prosthetic joint infection/osteomyelitis: Presented with presumably infected wound that occurred after sustaining a fall at his SNF with a wound that tracked below the fascia and down to the bone and hemiarthroplasty.  Status post irrigation and debridement 01/26/2021 with orthopedic surgery.  Likely not a candidate for any type of resection arthroplasty. History of ESBL Diabetes CKD Atrial fibrillation Parkinsonism  RECOMMENDATIONS:    Continue vancomycin per pharmacy Given history of ESBL will change from ceftriaxone to meropenem Follow-up surgical and blood cultures Wound care, glycemic control Will follow   Principal Problem:   Hip abrasion, infected Active Problems:   Essential hypertension   Parkinsonism (HCC)   Panic disorder without agoraphobia   GERD (gastroesophageal reflux disease)   Diabetic peripheral neuropathy (HCC)   Chronic kidney disease, stage 3a (HCC)   Slow transit constipation   PAF (paroxysmal atrial fibrillation) (HCC)   MEDICATIONS:    Scheduled Meds:  acetaminophen  1,000 mg Oral Q6H   amiodarone  200 mg Oral Daily   clonazePAM  0.25 mg Oral Daily   clonazePAM  0.5 mg Oral QHS   docusate sodium  100 mg Oral BID   famotidine  20 mg Oral Daily   ferrous sulfate  325 mg Oral Q breakfast   gabapentin  300 mg Oral QHS   heparin  5,000 Units Subcutaneous Q8H   insulin aspart  0-5 Units Subcutaneous QHS   insulin aspart  0-9 Units Subcutaneous TID WC   insulin glargine-yfgn  4 Units Subcutaneous BID   melatonin  5 mg Oral QHS   metoprolol tartrate  12.5 mg Oral BID   OLANZapine  2.5 mg Oral QHS   Rotigotine  1 patch  Transdermal QHS   thiamine  100 mg Oral Daily   traMADol  75 mg Oral BID   Continuous Infusions:  sodium chloride 125 mL/hr at 01/27/21 0608   cefTRIAXone (ROCEPHIN)  IV 2 g (01/27/21 0033)   vancomycin     PRN Meds:.HYDROmorphone (DILAUDID) injection, methocarbamol, OLANZapine, ondansetron **OR** ondansetron (ZOFRAN) IV  HPI:    Jason Hartman is a 85 y.o. male with a past medical history of diabetes, hypertension, BPH, Parkinson's, hyperlipidemia, stage III CKD, atrial fibrillation, ESBL colonization, and anxiety who was admitted to the hospitalist service last night after surgical debridement of his left hip by orthopedic surgery.  Patient initially presented to his orthopedic office on 10/20 from [facility] due to drainage from the site of a recent hip hemiarthroplasty on 12/30/2020 for a left displaced femoral neck fracture.  The drainage developed after a fall at rehab and at that time he was started on antibiotics for possible infection with Rocephin (per ortho).  Due to worsening drainage and concern for ongoing infection he was taken last night for washout with orthopedics and had a wound VAC placed.  He was started on vancomycin and ceftriaxone overnight.  Per orthopedic surgery, at the time of his wound washout there was a large amount of liquefied type fat that was draining from the wound.  It was felt that the wound tracked deep beyond the fascia and down to his hardware and bone.  He did not undergo  resection of his implant yesterday.  This morning he is in his hospital bed and his greatest concern is trying to get breakfast as he is hungry and has not eaten for a long time.   Past Medical History:  Diagnosis Date   CAD (coronary artery disease)    s/p stents   Cellulitis and abscess of leg, except foot    Hypertension    Obstructive sleep apnea (adult) (pediatric)    Restless legs syndrome (RLS)    Thoracic or lumbosacral neuritis or radiculitis, unspecified    Type II or  unspecified type diabetes mellitus without mention of complication, not stated as uncontrolled    Unspecified disease of pericardium    Unspecified hereditary and idiopathic peripheral neuropathy     Social History   Tobacco Use   Smoking status: Former    Types: Cigarettes    Quit date: 09/01/1980    Years since quitting: 40.4   Smokeless tobacco: Never  Vaping Use   Vaping Use: Never used  Substance Use Topics   Alcohol use: Yes    Comment: rare   Drug use: Never    Family History  Problem Relation Age of Onset   Diabetes Father     Allergies  Allergen Reactions   Oxycodone Anaphylaxis   Iodinated Diagnostic Agents Hives    alleric to renografin,isovue & omnipaque, hives, requires 13 hr prep//a.[email or web address], Onset Date: [id number]      Iodine Hives and Rash    alleric to renografin,isovue & omnipaque, hives, requires 13 hr prep//a.[email or web address], Onset Date: [id number] allergic to renografin,isovue & omnipaque, hives, requires 13 hr prep//a.[email or web address], Onset Date: [id number]   Linezolid Hives and Rash        Methadone Hcl Other (See Comments)    hallucinations    Promethazine Other (See Comments)    Delirium (pulled out IV), erratic behavior Mental status change     Isovue [Iopamidol] Hives   Morphine Sulfate Other (See Comments)    Unknown reaction   Omnipaque [Iohexol] Hives    Code: HIVES, Desc: alleric to renografin,isovue & omnipaque, hives, requires 13 hr prep//a.[email or web address], Onset Date: [id number]    Quetiapine Other (See Comments)    Unknown reaction   Renografin [Diatrizoate] Hives   Statins Other (See Comments)    Muscle weakness   Zolpidem Other (See Comments)    Erratic behavior/delusions Altered mental status   Atorvastatin Itching and Other (See Comments)    Tired, weakness    Carbidopa-Levodopa Anxiety   Chlorhexidine Gluconate [Chlorhexidine] Hives and Rash   Clindamycin Rash   Colesevelam Other (See Comments)    tired    Doxazosin Rash   Lovastatin Other  (See Comments)    Tired, nervousness    Rosuvastatin Other (See Comments)    Tired, weakness     Review of Systems  Constitutional: Negative.   Respiratory: Negative.    Cardiovascular: Negative.   Gastrointestinal:        + Hungry  Musculoskeletal:  Positive for joint pain.  Skin: Negative.   Psychiatric/Behavioral:  The patient is nervous/anxious.    OBJECTIVE:   Blood pressure 129/69, pulse 64, temperature (!) 97.4 F (36.3 C), temperature source Oral, resp. rate 16, height 5\' 10"  (1.778 m), weight 70.3 kg, SpO2 99 %. Body mass index is 22.24 kg/m.  Physical Exam Constitutional:      General: He is not in acute distress.    Appearance: Normal appearance.     Comments: He is anxious and upset about not having a  diet order yet.  HENT:     Head: Normocephalic and atraumatic.  Eyes:     Extraocular Movements: Extraocular movements intact.     Conjunctiva/sclera: Conjunctivae normal.  Pulmonary:     Effort: Pulmonary effort is normal. No respiratory distress.  Abdominal:     General: There is no distension.     Palpations: Abdomen is soft.     Tenderness: There is no abdominal tenderness.  Musculoskeletal:     Cervical back: Normal range of motion and neck supple.     Comments: Left hip wound vac in place.   Skin:    General: Skin is warm and dry.  Neurological:     General: No focal deficit present.     Mental Status: He is alert and oriented to person, place, and time.  Psychiatric:        Mood and Affect: Mood normal.        Behavior: Behavior normal.     Lab Results: Lab Results  Component Value Date   WBC 11.3 (H) 01/27/2021   HGB 8.5 (L) 01/27/2021   HCT 27.8 (L) 01/27/2021   MCV 91.4 01/27/2021   PLT 305 01/27/2021    Lab Results  Component Value Date   NA 132 (L) 01/27/2021   K 5.7 (H) 01/27/2021   CO2 24 01/27/2021   GLUCOSE 215 (H) 01/27/2021   BUN 22 01/27/2021   CREATININE 0.99 01/27/2021   CALCIUM 8.5 (L) 01/27/2021   GFRNONAA >60  01/27/2021   GFRAA 56 (L) 04/27/2019    Lab Results  Component Value Date   ALT 18 01/03/2021   AST 28 01/03/2021   ALKPHOS 88 01/03/2021   BILITOT 0.9 01/03/2021       Component Value Date/Time   CRP 0.6 09/09/2020 0635   CRP 7.8 (H) 11/12/2016 1129       Component Value Date/Time   ESRSEDRATE 0 09/10/2020 1108    I have reviewed the micro and lab results in Epic.  Imaging: No results found.   Imaging independently reviewed in Epic.  [facility] for Infectious [facility] [phone] pager 01/27/2021, 10:10 AM   2

## 2021-01-28 DIAGNOSIS — E1142 Type 2 diabetes mellitus with diabetic polyneuropathy: Secondary | ICD-10-CM | POA: Diagnosis not present

## 2021-01-28 DIAGNOSIS — N1831 Chronic kidney disease, stage 3a: Secondary | ICD-10-CM | POA: Diagnosis not present

## 2021-01-28 DIAGNOSIS — A499 Bacterial infection, unspecified: Secondary | ICD-10-CM | POA: Diagnosis not present

## 2021-01-28 DIAGNOSIS — T8452XD Infection and inflammatory reaction due to internal left hip prosthesis, subsequent encounter: Secondary | ICD-10-CM | POA: Diagnosis not present

## 2021-01-28 LAB — GLUCOSE, CAPILLARY
Glucose-Capillary: 143 mg/dL — ABNORMAL HIGH (ref 70–99)
Glucose-Capillary: 159 mg/dL — ABNORMAL HIGH (ref 70–99)
Glucose-Capillary: 126 mg/dL — ABNORMAL HIGH (ref 70–99)
Glucose-Capillary: 160 mg/dL — ABNORMAL HIGH (ref 70–99)

## 2021-01-28 NOTE — Anesthesia Postprocedure Evaluation (Addendum)
Anesthesia Post Note  Patient: Jason Hartman  Procedure(s) Performed: IRRIGATION AND DEBRIDEMENT LEFT HIP (Left: Hip) APPLICATION OF WOUND VAC (Left: Hip)     Patient location during evaluation: PACU Anesthesia Type: General Level of consciousness: sedated Pain management: pain level controlled Vital Signs Assessment: post-procedure vital signs reviewed and stable Respiratory status: spontaneous breathing and respiratory function stable Cardiovascular status: stable Postop Assessment: no apparent nausea or vomiting Anesthetic complications: no   No notable events documented.               Maurissa Ambrose DANIEL

## 2021-01-28 NOTE — Progress Notes (Addendum)
PROGRESS NOTE    Jason Hartman  ZOX:[id number]  DOB: Nov 25, 1935  PCP: [name], MD Admit date:01/26/2021 Chief compliant: draining wound after fall at SNF, recent hemiarthroplasty Presented with presumably infected wound that occurred after sustaining a fall at his SNF with a wound that tracked below the fascia and down to the bone and hemiarthroplasty.  Hospital course: Patient admitted to [facility] with broad spectrum abx and ortho/ ID consults for Left hip prosthetic joint infection/osteomyelitis.  Status post irrigation and debridement 01/26/2021 with orthopedic surgery.  Likely not a candidate for any type of resection arthroplasty. 10/22: Given history of ESBL, abx changed by ID from ceftriaxone to meropenem. WBAT and MMPT per ortho.   Subjective: Patient laying in bed and uncomfortable with his head position.  Requested to lower the head end of bed and also requested pain medication to help with left hip pain-6/10-unable to move left lower extremity due to pain..  Denied any nausea or vomiting but noted only ate 25% (soup) in the lunch tray.  He stated he does not like to be messy spilling the food but will try if gets assistance with feeding.  Objective: Vitals:   01/27/21 1617 01/27/21 2028 01/28/21 0502 01/28/21 0834  BP: (!) 97/59 (!) 107/33 134/65 (!) 174/63  Pulse: 65 69 66 88  Resp: 16 18 18 17   Temp: 98.1 F (36.7 C) 98.3 F (36.8 C) (!) 97.5 F (36.4 C) (!) 97.3 F (36.3 C)  TempSrc: Oral Oral Oral Oral  SpO2: 100% 100% 100% 100%  Weight:      Height:        Intake/Output Summary (Last 24 hours) at 01/28/2021 1405 Last data filed at 01/28/2021 1100 Gross per 24 hour  Intake 1269.38 ml  Output 1650 ml  Net -380.62 ml   Filed Weights   01/26/21 1226  Weight: 70.3 kg    Physical Examination:  General: Thin built, mild distress due to pain in bed position noted Head ENT: Atraumatic normocephalic, PERRLA, neck supple Heart: S1-S2 heard, regular rate and  rhythm, no murmurs.  No leg edema noted Lungs: Equal air entry bilaterally, no rhonchi or rales on exam, no accessory muscle use Abdomen: Bowel sounds heard, soft, nontender, nondistended. No organomegaly.  No CVA tenderness Extremities: No pedal edema.  Limited range of motion along the left hip, wound VAC in place Neurological: Awake alert oriented x3, no focal weakness or numbness, strength and sensations to crude touch intact     Data Reviewed: I have personally reviewed following labs and imaging studies  CBC: Recent Labs  Lab 01/27/21 0108  WBC 11.3*  HGB 8.5*  HCT 27.8*  MCV 91.4  PLT 811   Basic Metabolic Panel: Recent Labs  Lab 01/26/21 2140 01/27/21 0108  NA 129* 132*  K 5.1 5.7*  CL 98 100  CO2 21* 24  GLUCOSE 220* 215*  BUN 20 22  CREATININE 1.07 0.99  CALCIUM 8.4* 8.5*   GFR: Estimated Creatinine Clearance: 57.2 mL/min (by C-G formula based on SCr of 0.99 mg/dL). Liver Function Tests: No results for input(s): AST, ALT, ALKPHOS, BILITOT, PROT, ALBUMIN in the last 168 hours. No results for input(s): LIPASE, AMYLASE in the last 168 hours. No results for input(s): AMMONIA in the last 168 hours. Coagulation Profile: No results for input(s): INR, PROTIME in the last 168 hours. Cardiac Enzymes: No results for input(s): CKTOTAL, CKMB, CKMBINDEX, TROPONINI in the last 168 hours. BNP (last 3 results) No results for input(s): PROBNP in the  last 8760 hours. HbA1C: Recent Labs    01/27/21 0108  HGBA1C 6.8*   CBG: Recent Labs  Lab 01/27/21 1110 01/27/21 1633 01/27/21 2024 01/28/21 0830 01/28/21 1137  GLUCAP 187* 144* 168* 143* 159*   Lipid Profile: No results for input(s): CHOL, HDL, LDLCALC, TRIG, CHOLHDL, LDLDIRECT in the last 72 hours. Thyroid Function Tests: No results for input(s): TSH, T4TOTAL, FREET4, T3FREE, THYROIDAB in the last 72 hours. Anemia Panel: No results for input(s): VITAMINB12, FOLATE, FERRITIN, TIBC, IRON, RETICCTPCT in the last  72 hours. Sepsis Labs: No results for input(s): PROCALCITON, LATICACIDVEN in the last 168 hours.  Recent Results (from the past 240 hour(s))  Culture, blood (routine x 2)     Status: None (Preliminary result)   Collection Time: 01/26/21  9:40 PM   Specimen: BLOOD RIGHT HAND  Result Value Ref Range Status   Specimen Description BLOOD RIGHT HAND  Final   Special Requests   Final    BOTTLES DRAWN AEROBIC ONLY Blood Culture results may not be optimal due to an inadequate volume of blood received in culture bottles   Culture   Final    NO GROWTH 2 DAYS Performed at [facility], [place] [address] [place], [place] [address]    Report Status PENDING  Incomplete  Culture, blood (routine x 2)     Status: None (Preliminary result)   Collection Time: 01/26/21  9:51 PM   Specimen: BLOOD LEFT ARM  Result Value Ref Range Status   Specimen Description BLOOD LEFT ARM  Final   Special Requests   Final    BOTTLES DRAWN AEROBIC ONLY Blood Culture results may not be optimal due to an inadequate volume of blood received in culture bottles   Culture   Final    NO GROWTH 2 DAYS Performed at [facility], [place] [address] [place], [place] [address]    Report Status PENDING  Incomplete      Radiology Studies: No results found.    Scheduled Meds:  amiodarone  200 mg Oral Daily   clonazePAM  0.25 mg Oral Daily   clonazePAM  0.5 mg Oral QHS   docusate sodium  100 mg Oral BID   famotidine  20 mg Oral Daily   ferrous sulfate  325 mg Oral Q breakfast   gabapentin  300 mg Oral QHS   heparin  5,000 Units Subcutaneous Q8H   insulin aspart  0-5 Units Subcutaneous QHS   insulin aspart  0-9 Units Subcutaneous TID WC   insulin glargine-yfgn  4 Units Subcutaneous BID   melatonin  5 mg Oral QHS   metoprolol tartrate  12.5 mg Oral BID   OLANZapine  2.5 mg Oral QHS   Rotigotine  1 patch Transdermal QHS   thiamine  100 mg Oral Daily   traMADol  75 mg Oral BID   Continuous Infusions:  sodium  chloride 125 mL/hr at 01/27/21 1852   meropenem (MERREM) IV Stopped (01/28/21 6761)   vancomycin Stopped (01/27/21 2257)      Assessment/Plan:   #1.  Left hip prosthetic infection: S/p OR irrigation and debridement by orthopedics on 10/21, wound VAC in place.  Seen by infectious diseases-antibiotics transitioned to meropenem given history of ESBL.  Follow-up surgical wound cultures.  Encouraged to use pain medicines PRN as ordered   #2: Hypertension: BP low yesterday but appears to be elevated today likely due to pain.  Remains on IV fluids.  Continue metoprolol-add holding parameters  #3:Hyperkalemia: Patient noted  to have creatinine 0.90 on labs yesterday with hyperkalemia 5.7.  Not sure if truly has underlying CKD as mentioned in prior notes.  Repeat labs today and treat hyperkalemia if persistent.  #4: Paroxysmal atrial fibrillation: Anticoagulation held for surgical procedure--now on wound VAC-can resume Eliquis if no further interventions planned and when okay per orthopedics.  Repeat hemoglobin level today.  Continue amiodarone, metoprolol  #5: Diabetes mellitus type 2 with peripheral neuropathy: Oral hypoglycemics on hold.  Continue Lantus and sliding scale insulin  #6: Hyponatremia: Sodium appears to be improving slowly, his oral intake however remains poor.  Will reduce IV fluid rate and can likely DC in a.m. if eating adequately.  Requested nurse to assist with oral intake.Marland Kitchen  #7: GERD: Continue home Pepcid.  #8: Anxiety/panic disorder: Resume home meds   DVT prophylaxis: Heparin Code Status: Full code Family / Patient Communication: Discussed with patient in detail and requested nurse to address some of his concerns laidback position, assistance with feeding etc. Disposition Plan:   Status is: Inpatient   Remains inpatient appropriate because: IV antibiotics       Time spent: 25 minutes     >50% time spent in discussions with care team and coordination of  care.    Guilford Shi, MD [facility] Pager in [place]  If 7PM-7AM, please contact night-coverage [email or web address] 01/28/2021, 2:05 PM

## 2021-01-28 NOTE — Progress Notes (Signed)
   Subjective:  Sleeping comfortably in bed. Tolerating diet. No issues overnight. PICC attempt unsuccessful due to anxiety.  Objective:   VITALS:   Vitals:   01/27/21 1617 01/27/21 2028 01/28/21 0502 01/28/21 0834  BP: (!) 97/59 (!) 107/33 134/65 (!) 174/63  Pulse: 65 69 66 88  Resp: 16 18 18 17   Temp: 98.1 F (36.7 C) 98.3 F (36.8 C) (!) 97.5 F (36.4 C) (!) 97.3 F (36.3 C)  TempSrc: Oral Oral Oral Oral  SpO2: 100% 100% 100% 100%  Weight:      Height:        Neurologically intact Neurovascular intact Intact pulses distally Vac in place holding suction  Lab Results  Component Value Date   WBC 11.3 (H) 01/27/2021   HGB 8.5 (L) 01/27/2021   HCT 27.8 (L) 01/27/2021   MCV 91.4 01/27/2021   PLT 305 01/27/2021     Assessment/Plan:  2 Days Post-Op   - Expected postop acute blood loss anemia - will monitor for symptoms - Patient to work with PT/OT to optimize mobilization safely - DVT ppx - SCDs, ambulation, heparin 5000 TID - Appreciate ID recs, fu cultures (spoke with lab this AM, they will process the specimen asap) - WBAT operative extremity - Pain control - multimodal pain management, ATC acetaminophen in conjunction with as needed narcotic (oxycodone), although this should be minimized with other modalities    Jason Hartman 01/28/2021, 8:42 AM

## 2021-01-28 NOTE — Plan of Care (Signed)
  Problem: Education: Goal: Required Educational Video(s) Outcome: Progressing   Problem: Clinical Measurements: Goal: Postoperative complications will be avoided or minimized Outcome: Progressing   Problem: Skin Integrity: Goal: Demonstration of wound healing without infection will improve Outcome: Progressing   

## 2021-01-28 NOTE — TOC Progression Note (Signed)
Transition of Care [facility]) - Progression Note    Patient Details  Name: MARKELLE NAJARIAN MRN: [id number] Date of Birth: [birth year]  Transition of Care [facility]) CM/SW [place], [place] Phone Number: 01/28/2021, 2:14 PM  Clinical Narrative:     CSW received call from [place] with [facility] who confirmed that patient can return there when medically ready. Patients spouse has been updated.CSW will continue to follow and assist with patients dc planning needs.  Expected Discharge Plan: [place] Barriers to Discharge: Continued Medical Work up  Expected Discharge Plan and Services Expected Discharge Plan: [place] In-house Referral: Clinical Social Work     Living arrangements for the past 2 months: [place] (From [facility] short term rehab)                                       Social Determinants of Health (SDOH) Interventions    Readmission Risk Interventions No flowsheet data found.

## 2021-01-28 NOTE — NC FL2 (Signed)
[place] LEVEL OF CARE SCREENING TOOL     IDENTIFICATION  Patient Name: Jason Hartman Birthdate: January 12, 1936 Sex: male Admission Date (Current Location): 01/26/2021  [facility] and [place] Number:  Herbalist and Address:  [place]. [facility], [place] [address] [place], [place] [address]      Provider Number: [id number]  Attending Physician Name and Address:  Guilford Shi, MD  Relative Name and Phone Number:  Curt Bears [phone]    Current Level of Care: Hospital Recommended Level of Care: [place] Prior Approval Number:    Date Approved/Denied:   PASRR Number: [id number]  Discharge Plan: SNF    Current Diagnoses: Patient Active Problem List   Diagnosis Date Noted   ESBL (extended spectrum beta-lactamase) producing bacteria infection 01/27/2021   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[place]) 01/26/2021   Hip fracture ([place]) 12/29/2020   Malnutrition of moderate degree 10/27/2020   UTI (urinary tract infection) 10/25/2020   Sepsis ([place]) 10/12/2020   AMS (altered mental status)    PAF (paroxysmal atrial fibrillation) ([place])    Acute metabolic encephalopathy 84/69/6295   Anxiety 09/09/2020   SIRS (systemic inflammatory response syndrome) ([place]) 09/09/2020   Diaphoresis    Slow transit constipation 08/07/2020   Abdominal aortic aneurysm without rupture 03/17/2020   Abnormal gait 03/17/2020   Ascending aorta dilatation (HCC) 03/17/2020   Diabetic renal disease ([place]) 03/17/2020   Recurrent UTI 07/16/2019   Hypertensive urgency 04/26/2019   Malignant HTN with heart disease, w/o CHF, w/o chronic kidney disease 04/26/2019   Elevated troponin    LFTs abnormal    Statin intolerance 01/15/2019   Penis pain 04/10/2018   Urethra cancer ([place]) 02/25/2018   Lower extremity edema 06/09/2017   Peptic ulcer disease 05/16/2017   GERD (gastroesophageal reflux disease) 05/16/2017   Chronic kidney disease, stage 3a ([place])  04/11/2017   Lower urinary tract symptoms (LUTS) 04/15/2016   Narcotic dependence ([place]) 08/31/2015   Imbalance 08/31/2015   Diabetic peripheral neuropathy ([place]) 08/31/2015   Other malaise and fatigue 05/02/2015   Chronic fatigue 05/02/2015   Panic disorder without agoraphobia 03/22/2015   Hyperlipidemia 02/03/2014   Thrombocytopenia ([place]) 08/18/2013   Parkinsonism ([place]) 08/18/2013   Other disorders of lung 08/18/2013   Organic impotence 08/18/2013   Memory loss 08/18/2013   Low back pain 08/18/2013   Iron deficiency anemia 08/18/2013   Hypercalcemia 08/18/2013   Cellulitis of right leg 08/18/2013   Atherosclerotic heart disease of native coronary artery without angina pectoris 08/18/2013   RLS (restless legs syndrome) 09/01/2012   HERPES SIMPLEX INFECTION 11/06/2006   Type II diabetes mellitus ([place]) 11/06/2006   HYPOGONADISM 11/06/2006   VITAMIN D DEFICIENCY 11/06/2006   ANXIETY 11/06/2006   OBSTRUCTIVE SLEEP APNEA 11/06/2006   RESTLESS LEG SYNDROME 11/06/2006   Essential hypertension 11/06/2006   BENIGN PROSTATIC HYPERTROPHY 11/06/2006   ROSACEA 11/06/2006   OSTEOARTHRITIS 11/06/2006   INSOMNIA 11/06/2006   HYPERGLYCEMIA 11/06/2006   COLONIC POLYPS, HX OF 11/06/2006    Orientation RESPIRATION BLADDER Height & Weight     Self, Time, Place  Normal Incontinent, External catheter (External Urinary Catheter) Weight: 155 lb (70.3 kg) Height:  5\' 10"  (177.8 cm)  BEHAVIORAL SYMPTOMS/MOOD NEUROLOGICAL BOWEL NUTRITION STATUS      Incontinent Diet (Please see discharge summary)  AMBULATORY STATUS COMMUNICATION OF NEEDS Skin   Limited Assist Verbally Other (Comment) (Pale,dry,Incision Closed Hip L,Neg. Pressure wound therapy,Incision closed Hip L,Hydrogel,Wound Incision open or dehiced skin tear elbow,anterior,L,with pus  discharge,wound incision open or dehiced skin tear arm,anterior,lower,proximal,R,see add. info)                       Personal Care Assistance Level of  Assistance  Bathing, Feeding, Dressing Bathing Assistance: Maximum assistance Feeding assistance: Limited assistance (Can feed himself, Assist with meal set up) Dressing Assistance: Maximum assistance     Functional Limitations Info  Sight, Hearing, Speech Sight Info: Impaired Hearing Info: Adequate Speech Info: Adequate    SPECIAL CARE FACTORS FREQUENCY  PT (By licensed PT), OT (By licensed OT)     PT Frequency: 5x min weekly OT Frequency: 5x min weekly            Contractures Contractures Info: Not present    Additional Factors Info  Code Status, Psychotropic, Isolation Precautions Code Status Info: FULL Allergies Info: Oxycodone,Iodinated Diagnostic Agents,Iodine,Linezolid,Methadone Hcl,Promethazine,Isovue (iopamidol),Methadone,Morphine Sulfate,Omnipaque (iohexol),Quetiapine,Renografin (diatrizoate),Statins,Zolpidem,Atorvastatin,Carbidopa-levodopa,Chlorhexidine Gluconate (chlorhexidine),Clindamycin,Colesevelam,Doxazosin,Lovastatin,Rosuvastatin Psychotropic Info: clonazePAM (KLONOPIN) tablet 0.5 mg daily at bedtime,clonazePAM (KLONOPIN) disintegrating tablet 0.25 mg daily,OLANZapine (ZYPREXA) tablet 2.5 mg daily at bedtime Insulin Sliding Scale Info: insulin aspart (novoLOG) injection 0-5 Units daily at bedtime,insulin aspart (novoLOG) injection 0-9 Units 3 times daily with meals,insulin glargine-yfgn (SEMGLEE) injection 4 Units 2 times daily Isolation Precautions Info: ESBL     Current Medications (01/28/2021):  This is the current hospital active medication list Current Facility-Administered Medications  Medication Dose Route Frequency Provider Last Rate Last Admin   0.9 %  sodium chloride infusion   Intravenous Continuous Guilford Shi, MD 125 mL/hr at 01/27/21 1852 New Bag at 01/27/21 1852   amiodarone (PACERONE) tablet 200 mg  200 mg Oral Daily Gilles Chiquito B, MD   200 mg at 01/28/21 0836   clonazePAM (KLONOPIN) disintegrating tablet 0.25 mg  0.25 mg Oral Daily  Gilles Chiquito B, MD   0.25 mg at 01/28/21 0835   clonazePAM (KLONOPIN) tablet 0.5 mg  0.5 mg Oral QHS Gilles Chiquito B, MD   0.5 mg at 01/27/21 2130   docusate sodium (COLACE) capsule 100 mg  100 mg Oral BID Gilles Chiquito B, MD   100 mg at 01/28/21 0836   famotidine (PEPCID) tablet 20 mg  20 mg Oral Daily Gilles Chiquito B, MD   20 mg at 01/28/21 3220   ferrous sulfate tablet 325 mg  325 mg Oral Q breakfast Gilles Chiquito B, MD   325 mg at 01/28/21 0836   gabapentin (NEURONTIN) capsule 300 mg  300 mg Oral QHS Gilles Chiquito B, MD   300 mg at 01/27/21 2130   heparin injection 5,000 Units  5,000 Units Subcutaneous Q8H Gilles Chiquito B, MD   5,000 Units at 01/28/21 0538   HYDROmorphone (DILAUDID) injection 0.5-1 mg  0.5-1 mg Intravenous Q4H PRN Gilles Chiquito B, MD   1 mg at 01/28/21 1144   insulin aspart (novoLOG) injection 0-5 Units  0-5 Units Subcutaneous QHS Gilles Chiquito B, MD   2 Units at 01/26/21 2305   insulin aspart (novoLOG) injection 0-9 Units  0-9 Units Subcutaneous TID WC Gilles Chiquito B, MD   2 Units at 01/28/21 1144   insulin glargine-yfgn (SEMGLEE) injection 4 Units  4 Units Subcutaneous BID Gilles Chiquito B, MD   4 Units at 01/28/21 0838   melatonin tablet 5 mg  5 mg Oral QHS Gilles Chiquito B, MD   5 mg at 01/27/21 2129   meropenem (MERREM) 2 g in sodium chloride 0.9 % 100 mL IVPB  2 g Intravenous Q8H Darlina Sicilian, [facility]  Stopped at 01/28/21 0644   methocarbamol (ROBAXIN) tablet 500 mg  500 mg Oral Q6H PRN Gilles Chiquito B, MD   500 mg at 01/27/21 5465   metoprolol tartrate (LOPRESSOR) tablet 12.5 mg  12.5 mg Oral BID Guilford Shi, MD   12.5 mg at 01/28/21 0836   OLANZapine (ZYPREXA) tablet 2.5 mg  2.5 mg Oral BID PRN Gilles Chiquito B, MD   2.5 mg at 01/28/21 1143   OLANZapine (ZYPREXA) tablet 2.5 mg  2.5 mg Oral QHS Gilles Chiquito B, MD   2.5 mg at 01/27/21 2129   ondansetron (ZOFRAN) tablet 4 mg  4 mg Oral Q6H PRN Sid Falcon, MD       Or   ondansetron [facility]) injection 4 mg  4  mg Intravenous Q6H PRN Sid Falcon, MD       Rotigotine PT24 1 patch  1 patch Transdermal QHS Gilles Chiquito B, MD   1 patch at 01/27/21 2141   thiamine tablet 100 mg  100 mg Oral Daily Gilles Chiquito B, MD   100 mg at 01/28/21 0835   traMADol (ULTRAM) tablet 75 mg  75 mg Oral BID Gilles Chiquito B, MD   75 mg at 01/28/21 0836   vancomycin (VANCOREADY) IVPB 1250 mg/250 mL  1,250 mg Intravenous Q24H Sherilyn Dacosta, [facility]   Stopped at 01/27/21 2257     Discharge Medications: Please see discharge summary for a list of discharge medications.  Relevant Imaging Results:  Relevant Lab Results:   Additional Information SSN- [id number], Both Covid Vaccines and 1 Booster, Wound Incision open or dehiced MASD Buttocks,proximal,Lower,Foam lift dressing,assess site every shift,Negative Pressure Wound therapy,Hip Right  Milas Gain, LCSWA

## 2021-01-28 NOTE — TOC Initial Note (Addendum)
Transition of Care [facility]) - Initial/Assessment Note    Patient Details  Name: Jason Hartman MRN: [id number] Date of Birth: [birth year]  [facility]) CM/SW Contact:    Milas Gain, [place] Phone Number: 01/28/2021, 11:39 AM  Clinical Narrative:                  CSW received consult for possible SNF placement at time of discharge. Due to patients current orientation CSW spoke with patients spouse Curt Bears.CSW spoke with patients spouse regarding PT recommendation of SNF placement at time of discharge. Patients spouse expressed understanding of PT recommendation and is agreeable to SNF placement for patient at time of discharge. Patients spouse reports patient comes from [facility] term. Patients spouse confirms plan is for patient to return to [facility] when medically ready for dc. CSW discussed insurance authorization process.Patients spouse reports patient has received the COVID vaccines as well as 1 booster. No further questions reported at this time. CSW awaiting callback from [facility] to confirm SNF bed for patient.CSW to continue to follow and assist with discharge planning needs.  Expected Discharge Plan: Skilled Nursing Facility Barriers to Discharge: Continued Medical Work up   Patient Goals and CMS Choice   CMS Medicare.gov Compare Post Acute Care list provided to:: Patient Represenative (must comment) (Patient spouse Curt Bears) Choice offered to / list presented to : Spouse (Patient spouse Curt Bears)  Expected Discharge Plan and Services Expected Discharge Plan: [place] In-house Referral: Clinical Social Work     Living arrangements for the past 2 months: [place] (From [facility] short term rehab)                                      Prior Living Arrangements/Services Living arrangements for the past 2 months: [place] (From [facility] short term rehab) Lives with:: Self, Presenter, broadcasting  (recently came from [facility] term) Patient language and need for interpreter reviewed:: Yes Do you feel safe going back to the place where you live?: No   SNF  Need for Family Participation in Patient Care: Yes (Comment) Care giver support system in place?: Yes (comment)   Criminal Activity/Legal Involvement Pertinent to Current Situation/Hospitalization: No - Comment as needed  Activities of Daily Living      Permission Sought/Granted Permission sought to share information with : Case Manager, Family Supports, Customer service manager Permission granted to share information with : No  Share Information with NAME: Only oriented to person,place,time spoke with patients spouse Curt Bears  Permission granted to share info w AGENCY: Only oriented to person,place,time spoke with patients spouse Kathryn/ SNF  Permission granted to share info w Relationship: Only oriented to person,place,time spoke with patients spouse Curt Bears, spouse  Permission granted to share info w Contact Information: Only oriented to person,place,time spoke with patients spouse Curt Bears [phone]  Emotional Assessment       Orientation: : Oriented to Self, Oriented to Place, Oriented to  Time Alcohol / Substance Use: Not Applicable Psych Involvement: No (comment)  Admission diagnosis:  Hip abrasion, infected [N98.921J, L08.9] Patient Active Problem List   Diagnosis Date Noted   ESBL (extended spectrum beta-lactamase) producing bacteria infection 01/27/2021   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[place]) 01/26/2021   Hip fracture ([place]) 12/29/2020   Malnutrition of moderate degree 10/27/2020   UTI (urinary tract infection) 10/25/2020   Sepsis ([place]) 10/12/2020  AMS (altered mental status)    PAF (paroxysmal atrial fibrillation) (HCC)    Acute metabolic encephalopathy 32/91/9166   Anxiety 09/09/2020   SIRS (systemic inflammatory response syndrome) (HCC) 09/09/2020   Diaphoresis    Slow transit  constipation 08/07/2020   Abdominal aortic aneurysm without rupture 03/17/2020   Abnormal gait 03/17/2020   Ascending aorta dilatation (HCC) 03/17/2020   Diabetic renal disease ([place]) 03/17/2020   Recurrent UTI 07/16/2019   Hypertensive urgency 04/26/2019   Malignant HTN with heart disease, w/o CHF, w/o chronic kidney disease 04/26/2019   Elevated troponin    LFTs abnormal    Statin intolerance 01/15/2019   Penis pain 04/10/2018   Urethra cancer ([place]) 02/25/2018   Lower extremity edema 06/09/2017   Peptic ulcer disease 05/16/2017   GERD (gastroesophageal reflux disease) 05/16/2017   Chronic kidney disease, stage 3a ([place]) 04/11/2017   Lower urinary tract symptoms (LUTS) 04/15/2016   Narcotic dependence ([place]) 08/31/2015   Imbalance 08/31/2015   Diabetic peripheral neuropathy ([place]) 08/31/2015   Other malaise and fatigue 05/02/2015   Chronic fatigue 05/02/2015   Panic disorder without agoraphobia 03/22/2015   Hyperlipidemia 02/03/2014   Thrombocytopenia ([place]) 08/18/2013   Parkinsonism ([place]) 08/18/2013   Other disorders of lung 08/18/2013   Organic impotence 08/18/2013   Memory loss 08/18/2013   Low back pain 08/18/2013   Iron deficiency anemia 08/18/2013   Hypercalcemia 08/18/2013   Cellulitis of right leg 08/18/2013   Atherosclerotic heart disease of native coronary artery without angina pectoris 08/18/2013   RLS (restless legs syndrome) 09/01/2012   HERPES SIMPLEX INFECTION 11/06/2006   Type II diabetes mellitus ([place]) 11/06/2006   HYPOGONADISM 11/06/2006   VITAMIN D DEFICIENCY 11/06/2006   ANXIETY 11/06/2006   OBSTRUCTIVE SLEEP APNEA 11/06/2006   RESTLESS LEG SYNDROME 11/06/2006   Essential hypertension 11/06/2006   BENIGN PROSTATIC HYPERTROPHY 11/06/2006   ROSACEA 11/06/2006   OSTEOARTHRITIS 11/06/2006   INSOMNIA 11/06/2006   HYPERGLYCEMIA 11/06/2006   COLONIC POLYPS, HX OF 11/06/2006   PCP:  [name], MD Pharmacy:   [facility] [address] [place] [address] Phone: [phone] Fax: [phone]     Social Determinants of Health (SDOH) Interventions    Readmission Risk Interventions No flowsheet data found.

## 2021-01-28 NOTE — Addendum Note (Signed)
Addendum  created 01/28/21 1936 by Duane Boston, MD   Clinical Note Signed

## 2021-01-29 ENCOUNTER — Encounter (HOSPITAL_COMMUNITY): Payer: Self-pay | Admitting: Orthopaedic Surgery

## 2021-01-29 DIAGNOSIS — T8452XD Infection and inflammatory reaction due to internal left hip prosthesis, subsequent encounter: Secondary | ICD-10-CM | POA: Diagnosis not present

## 2021-01-29 DIAGNOSIS — I480 Paroxysmal atrial fibrillation: Secondary | ICD-10-CM | POA: Diagnosis not present

## 2021-01-29 DIAGNOSIS — N1831 Chronic kidney disease, stage 3a: Secondary | ICD-10-CM | POA: Diagnosis not present

## 2021-01-29 LAB — GLUCOSE, CAPILLARY
Glucose-Capillary: 131 mg/dL — ABNORMAL HIGH (ref 70–99)
Glucose-Capillary: 158 mg/dL — ABNORMAL HIGH (ref 70–99)
Glucose-Capillary: 166 mg/dL — ABNORMAL HIGH (ref 70–99)
Glucose-Capillary: 235 mg/dL — ABNORMAL HIGH (ref 70–99)

## 2021-01-29 LAB — RESP PANEL BY RT-PCR (FLU A&B, COVID) ARPGX2
Influenza A by PCR: NEGATIVE
Influenza B by PCR: NEGATIVE
SARS Coronavirus 2 by RT PCR: NEGATIVE

## 2021-01-29 LAB — CBC
HCT: 27.9 % — ABNORMAL LOW (ref 39.0–52.0)
Hemoglobin: 8.5 g/dL — ABNORMAL LOW (ref 13.0–17.0)
MCH: 28.1 pg (ref 26.0–34.0)
MCHC: 30.5 g/dL (ref 30.0–36.0)
MCV: 92.4 fL (ref 80.0–100.0)
Platelets: 260 10*3/uL (ref 150–400)
RBC: 3.02 MIL/uL — ABNORMAL LOW (ref 4.22–5.81)
RDW: 15 % (ref 11.5–15.5)
WBC: 8.2 10*3/uL (ref 4.0–10.5)
nRBC: 0 % (ref 0.0–0.2)

## 2021-01-29 LAB — BASIC METABOLIC PANEL
Anion gap: 8 (ref 5–15)
BUN: 15 mg/dL (ref 8–23)
CO2: 27 mmol/L (ref 22–32)
Calcium: 8.2 mg/dL — ABNORMAL LOW (ref 8.9–10.3)
Chloride: 102 mmol/L (ref 98–111)
Creatinine, Ser: 1.03 mg/dL (ref 0.61–1.24)
GFR, Estimated: >60 mL/min (ref >60)
Glucose, Bld: 163 mg/dL — ABNORMAL HIGH (ref 70–99)
Potassium: 3.8 mmol/L (ref 3.5–5.1)
Sodium: 137 mmol/L (ref 135–145)

## 2021-01-29 LAB — VANCOMYCIN, TROUGH: Vancomycin Tr: 14 ug/mL — ABNORMAL LOW (ref 15–20)

## 2021-01-29 MED ORDER — ENSURE ENLIVE PO LIQD
237.0000 mL | Freq: Three times a day (TID) | ORAL | Status: DC
  Administered 2021-01-29 – 2021-02-01 (×10): 237 mL via ORAL

## 2021-01-29 MED ORDER — ADULT MULTIVITAMIN W/MINERALS CH
1.0000 | ORAL_TABLET | Freq: Every day | ORAL | Status: DC
  Administered 2021-01-29 – 2021-02-01 (×4): 1 via ORAL
  Filled 2021-01-29 (×4): qty 1

## 2021-01-29 NOTE — Care Management Important Message (Signed)
Important Message  Patient Details  Name: Jason Hartman MRN: [id number] Date of Birth: Jan 21, 1936   Medicare Important Message Given:  Yes Patient has a caution precaution order in place  Will mail the patient IM to the patient home address.     Julena Barbour 01/29/2021, 3:15 PM

## 2021-01-29 NOTE — Progress Notes (Addendum)
Initial Nutrition Assessment  DOCUMENTATION CODES:   Not applicable Suspect ongoing moderate malnutrition  INTERVENTION:   Ensure Enlive po TID, each supplement provides 350 kcal and 20 grams of protein MVI with minerals daily  NUTRITION DIAGNOSIS:   Increased nutrient needs related to wound healing as evidenced by estimated needs.  GOAL:   Patient will meet greater than or equal to 90% of their needs.  MONITOR:   PO intake, Supplement acceptance, Labs, Skin  REASON FOR ASSESSMENT:   Malnutrition Screening Tool    ASSESSMENT:   85 yo male admitted from skilled nursing rehab with left hip wound from prosthetic wound infection. PMH includes recent left hip hemiarthroplasty on 9/26 after a fall, Parkinson's disease, HTN, CAD, paroxysmal A. fib, peripheral neuropathy, DM-2.  S/P debridement and washout of L hip wound on 10/21. VAC was placed.   Spoke with patient on the phone, but he was unable to provide any reliable nutrition history. He says he is eating well.  Currently on a carbohydrate modified diet.  Patient ate 100% of breakfast yesterday.  No other meal intakes recorded.  Labs reviewed.  CBG: 131-158  Medications reviewed and include Colace, Pepcid, ferrous sulfate, Novolog, Semglee, thiamine, IV antibiotics.  Patient weighed 80 kg on 11/19/20.  Most recent weight 70.3 kg on 01/26/21. 12% weight loss within 3 months is severe. Patient was identified to have moderate malnutrition on admission 1 month ago.  Suspect malnutrition is ongoing, but unable to obtain enough information at this time for identification of malnutrition.   NUTRITION - FOCUSED PHYSICAL EXAM:  Unable to complete  Diet Order:   Diet Order             Diet Carb Modified Fluid consistency: Thin; Room service appropriate? Yes  Diet effective now                   EDUCATION NEEDS:   Not appropriate for education at this time  Skin:  Skin Integrity Issues:: Other (Comment), Wound  VAC Wound Vac: L hip incision Other: skin tear L elbow & R arm  Last BM:  no BM documented  Height:   Ht Readings from Last 1 Encounters:  01/26/21 5\' 10"  (1.778 m)    Weight:   Wt Readings from Last 1 Encounters:  01/26/21 70.3 kg    BMI:  Body mass index is 22.24 kg/m.  Estimated Nutritional Needs:   Kcal:  2100-2300  Protein:  100-120 gm  Fluid:  > 2.1 L    Lucas Mallow, RD, LDN, CNSC Please refer to Amion for contact information.

## 2021-01-29 NOTE — Progress Notes (Signed)
PROGRESS NOTE    Jason Hartman  HLK:[id number] DOB: 1935-05-09 DOA: 01/26/2021 PCP: [name], MD    Brief Narrative:  85 year old gentleman with history of recent left hip hemiarthroplasty on 9/26 after a fall, has history of Parkinson's, essential hypertension, CKD stage IIIa and paroxysmal A. fib along with previous history of ESBL who presented from skilled nursing rehab with ongoing left hip wound.  Patient was brought to orthopedics office with drainage from the site of the hip hemiarthroplasty that was done on 9/26.  He also suffered a fall at rehab.  He was taking oral antibiotics for presumed infection of the hip.  Patient was brought to the operating room and admitted to hospitalist service after undergoing debridement and washout on 10/21.   Assessment & Plan:   Principal Problem:   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HCC) Active Problems:   Essential hypertension   Parkinsonism (HCC)   Panic disorder without agoraphobia   GERD (gastroesophageal reflux disease)   Diabetic peripheral neuropathy (HCC)   Chronic kidney disease, stage 3a (HCC)   Slow transit constipation   PAF (paroxysmal atrial fibrillation) (HCC)   ESBL (extended spectrum beta-lactamase) producing bacteria infection  Prosthetic joint infection of the left hip: 10/21, debridement and wound VAC placement.  Followed by orthopedics and infectious disease.  Not a multistage surgical procedure candidate due to extensive underlying medical issues.  Probably long-term antibiotics treatment. Currently remains on vancomycin and meropenem, surgical wound cultures are pending.  Followed by ID.  Anticipate prolonged IV antibiotics. Pain relief and continue to work with PT OT to increase mobility and endurance.  Weightbearing as tolerated as per orthopedics.  Essential hypertension: Blood pressure fairly stable.  Antihypertensives with holding parameters.  Paroxysmal A. fib: Rate controlled on amiodarone and  metoprolol.  Eliquis on hold.  Once no further procedures planned, will continue.  Type 2 diabetes with peripheral neuropathy: Oral hypoglycemics on hold.  Blood sugars are fairly stable.  Continue Lantus and sliding scale insulin.  Anxiety/panic attacks: Patient on Klonopin, melatonin at night, gabapentin and Zyprexa at night.  Continued.  Continue to work with PT OT.  Adequate pain management.  Antibiotics plans to finalize before transitioning back to skilled nursing facility.   DVT prophylaxis: heparin injection 5,000 Units Start: 01/27/21 0600 SCDs Start: 01/26/21 1944 Place TED hose Start: 01/26/21 1944   Code Status: Full code Family Communication: None at bedside Disposition Plan: Status is: Inpatient  Remains inpatient appropriate because: Active surgical procedures.  IV antibiotics.         Consultants:  Infectious disease Orthopedics  Procedures:  Debridement left hip  Antimicrobials:  Vancomycin meropenem 10/21---   Subjective: Patient seen and examined.  Anxious.  Left hip hurts.  Has not mobilized.  He does not remember talking to surgeon.  He was sleepy with pain medication.  He was wondering what they can do for him to make him walk again.  Objective: Vitals:   01/28/21 1700 01/28/21 2105 01/29/21 0511 01/29/21 0734  BP: (!) 125/55 (!) 129/56 (!) 137/57 (!) 111/97  Pulse: 73 80 89 89  Resp: 17 17 17 16   Temp: 97.9 F (36.6 C) 98.2 F (36.8 C) 98.6 F (37 C) 98 F (36.7 C)  TempSrc: Oral Oral Oral Oral  SpO2: 99% 99% 98% 99%  Weight:      Height:        Intake/Output Summary (Last 24 hours) at 01/29/2021 1143 Last data filed at 01/29/2021 1052 Gross per 24 hour  Intake 3684.82 ml  Output 2050 ml  Net 1634.82 ml   Filed Weights   01/26/21 1226  Weight: 70.3 kg    Examination:  General exam: Frail.  Debilitated.  In mild distress and anxious.  Not in pain at rest.  Anxious to move. Respiratory system: Clear to auscultation.  Respiratory effort normal.  No added sounds. Cardiovascular system: S1 & S2 heard, RRR. No JVD, murmurs, rubs, gallops or clicks. Gastrointestinal system: Abdomen is nondistended, soft and nontender. No organomegaly or masses felt. Normal bowel sounds heard. Central nervous system: Alert and oriented.  Generalized weakness. Extremities:  Left hip with open incision, fitted with wound VAC.  Minimal clear drainage on the canister.  Distal neurovascular status intact.    Data Reviewed: I have personally reviewed following labs and imaging studies  CBC: Recent Labs  Lab 01/27/21 0108 01/29/21 0123  WBC 11.3* 8.2  HGB 8.5* 8.5*  HCT 27.8* 27.9*  MCV 91.4 92.4  PLT 305 017   Basic Metabolic Panel: Recent Labs  Lab 01/26/21 2140 01/27/21 0108 01/29/21 0123  NA 129* 132* 137  K 5.1 5.7* 3.8  CL 98 100 102  CO2 21* 24 27  GLUCOSE 220* 215* 163*  BUN 20 22 15   CREATININE 1.07 0.99 1.03  CALCIUM 8.4* 8.5* 8.2*   GFR: Estimated Creatinine Clearance: 55 mL/min (by C-G formula based on SCr of 1.03 mg/dL). Liver Function Tests: No results for input(s): AST, ALT, ALKPHOS, BILITOT, PROT, ALBUMIN in the last 168 hours. No results for input(s): LIPASE, AMYLASE in the last 168 hours. No results for input(s): AMMONIA in the last 168 hours. Coagulation Profile: No results for input(s): INR, PROTIME in the last 168 hours. Cardiac Enzymes: No results for input(s): CKTOTAL, CKMB, CKMBINDEX, TROPONINI in the last 168 hours. BNP (last 3 results) No results for input(s): PROBNP in the last 8760 hours. HbA1C: Recent Labs    01/27/21 0108  HGBA1C 6.8*   CBG: Recent Labs  Lab 01/28/21 1137 01/28/21 1713 01/28/21 2101 01/29/21 0757 01/29/21 1114  GLUCAP 159* 126* 160* 131* 158*   Lipid Profile: No results for input(s): CHOL, HDL, LDLCALC, TRIG, CHOLHDL, LDLDIRECT in the last 72 hours. Thyroid Function Tests: No results for input(s): TSH, T4TOTAL, FREET4, T3FREE, THYROIDAB in the  last 72 hours. Anemia Panel: No results for input(s): VITAMINB12, FOLATE, FERRITIN, TIBC, IRON, RETICCTPCT in the last 72 hours. Sepsis Labs: No results for input(s): PROCALCITON, LATICACIDVEN in the last 168 hours.  Recent Results (from the past 240 hour(s))  Aerobic/Anaerobic Culture w Gram Stain (surgical/deep wound)     Status: None (Preliminary result)   Collection Time: 01/26/21  5:04 PM   Specimen: PATH Other; Tissue  Result Value Ref Range Status   Specimen Description SYNOVIAL FLUID  Final   Special Requests SPEC A  Final   Gram Stain   Final    NO SQUAMOUS EPITHELIAL CELLS SEEN FEW WBC SEEN NO ORGANISMS SEEN    Culture   Final    CULTURE REINCUBATED FOR BETTER GROWTH Performed at [facility], [place] [address] [place], [place] [address]    Report Status PENDING  Incomplete  Aerobic/Anaerobic Culture w Gram Stain (surgical/deep wound)     Status: None (Preliminary result)   Collection Time: 01/26/21  5:04 PM   Specimen: PATH Other; Tissue  Result Value Ref Range Status   Specimen Description TISSUE  Final   Special Requests IT BAND 1 SPEC B  Final   Gram Stain  Final    NO SQUAMOUS EPITHELIAL CELLS SEEN MODERATE WBC SEEN NO ORGANISMS SEEN    Culture   Final    CULTURE REINCUBATED FOR BETTER GROWTH Performed at [facility], [place] [address], [place], [place] [address]    Report Status PENDING  Incomplete  Aerobic/Anaerobic Culture w Gram Stain (surgical/deep wound)     Status: None (Preliminary result)   Collection Time: 01/26/21  5:05 PM   Specimen: PATH Other; Tissue  Result Value Ref Range Status   Specimen Description TISSUE  Final   Special Requests IT BAND 2 SPEC C  Final   Gram Stain   Final    FEW SQUAMOUS EPITHELIAL CELLS PRESENT MODERATE WBC SEEN NO ORGANISMS SEEN    Culture   Final    CULTURE REINCUBATED FOR BETTER GROWTH Performed at [facility], [address], [place], [place] [address]    Report Status PENDING  Incomplete   Culture, blood (routine x 2)     Status: None (Preliminary result)   Collection Time: 01/26/21  9:40 PM   Specimen: BLOOD RIGHT HAND  Result Value Ref Range Status   Specimen Description BLOOD RIGHT HAND  Final   Special Requests   Final    BOTTLES DRAWN AEROBIC ONLY Blood Culture results may not be optimal due to an inadequate volume of blood received in culture bottles   Culture   Final    NO GROWTH 3 DAYS Performed at [facility], [place] [address], [place], [place] [address]    Report Status PENDING  Incomplete  Culture, blood (routine x 2)     Status: None (Preliminary result)   Collection Time: 01/26/21  9:51 PM   Specimen: BLOOD LEFT ARM  Result Value Ref Range Status   Specimen Description BLOOD LEFT ARM  Final   Special Requests   Final    BOTTLES DRAWN AEROBIC ONLY Blood Culture results may not be optimal due to an inadequate volume of blood received in culture bottles   Culture   Final    NO GROWTH 3 DAYS Performed at [facility], [place] [address] [place], [place] [address]    Report Status PENDING  Incomplete         Radiology Studies: No results found.      Scheduled Meds:  amiodarone  200 mg Oral Daily   clonazePAM  0.25 mg Oral Daily   clonazePAM  0.5 mg Oral QHS   docusate sodium  100 mg Oral BID   famotidine  20 mg Oral Daily   ferrous sulfate  325 mg Oral Q breakfast   gabapentin  300 mg Oral QHS   heparin  5,000 Units Subcutaneous Q8H   insulin aspart  0-5 Units Subcutaneous QHS   insulin aspart  0-9 Units Subcutaneous TID WC   insulin glargine-yfgn  4 Units Subcutaneous BID   melatonin  5 mg Oral QHS   metoprolol tartrate  12.5 mg Oral BID   OLANZapine  2.5 mg Oral QHS   Rotigotine  1 patch Transdermal QHS   thiamine  100 mg Oral Daily   traMADol  75 mg Oral BID   Continuous Infusions:  sodium chloride 75 mL/hr at 01/28/21 1554   meropenem (MERREM) IV 2 g (01/29/21 0836)   vancomycin 1,250 mg (01/28/21 2038)     LOS: 3 days     Time spent: 35 minutes    Barb Merino, MD [facility] Pager [phone]

## 2021-01-29 NOTE — Progress Notes (Signed)
Subjective: No new complaints   Antibiotics:  Anti-infectives (From admission, onward)    Start     Dose/Rate Route Frequency Ordered Stop   01/27/21 2100  vancomycin (VANCOREADY) IVPB 1250 mg/250 mL        1,250 mg 166.7 mL/hr over 90 Minutes Intravenous Every 24 hours 01/26/21 1957     01/27/21 1200  meropenem (MERREM) 2 g in sodium chloride 0.9 % 100 mL IVPB        2 g 200 mL/hr over 30 Minutes Intravenous Every 8 hours 01/27/21 1103     01/26/21 2100  cefTRIAXone (ROCEPHIN) 2 g in sodium chloride 0.9 % 100 mL IVPB  Status:  Discontinued        2 g 200 mL/hr over 30 Minutes Intravenous Every 24 hours 01/26/21 1943 01/27/21 1051   01/26/21 2100  vancomycin (VANCOREADY) IVPB 1500 mg/300 mL        1,500 mg 150 mL/hr over 120 Minutes Intravenous  Once 01/26/21 1952 01/27/21 0028   01/26/21 1230  ceFAZolin (ANCEF) IVPB 2g/100 mL premix        2 g 200 mL/hr over 30 Minutes Intravenous On call to O.R. 01/26/21 1224 01/26/21 1710       Medications: Scheduled Meds:  amiodarone  200 mg Oral Daily   clonazePAM  0.25 mg Oral Daily   clonazePAM  0.5 mg Oral QHS   docusate sodium  100 mg Oral BID   famotidine  20 mg Oral Daily   feeding supplement  237 mL Oral TID BM   ferrous sulfate  325 mg Oral Q breakfast   gabapentin  300 mg Oral QHS   heparin  5,000 Units Subcutaneous Q8H   insulin aspart  0-5 Units Subcutaneous QHS   insulin aspart  0-9 Units Subcutaneous TID WC   insulin glargine-yfgn  4 Units Subcutaneous BID   melatonin  5 mg Oral QHS   metoprolol tartrate  12.5 mg Oral BID   multivitamin with minerals  1 tablet Oral Daily   OLANZapine  2.5 mg Oral QHS   Rotigotine  1 patch Transdermal QHS   thiamine  100 mg Oral Daily   traMADol  75 mg Oral BID   Continuous Infusions:  sodium chloride 75 mL/hr at 01/28/21 1554   meropenem (MERREM) IV 2 g (01/29/21 1437)   vancomycin 1,250 mg (01/28/21 2038)   PRN Meds:.HYDROmorphone (DILAUDID) injection,  methocarbamol, OLANZapine, ondansetron **OR** ondansetron (ZOFRAN) IV    Objective: Weight change:   Intake/Output Summary (Last 24 hours) at 01/29/2021 1714 Last data filed at 01/29/2021 1440 Gross per 24 hour  Intake 2008.89 ml  Output 1900 ml  Net 108.89 ml   Blood pressure (!) 122/49, pulse 77, temperature 98.5 F (36.9 C), temperature source Oral, resp. rate 16, height 5\' 10"  (1.778 m), weight 70.3 kg, SpO2 99 %. Temp:  [98 F (36.7 C)-98.6 F (37 C)] 98.5 F (36.9 C) (10/24 1705) Pulse Rate:  [77-89] 77 (10/24 1705) Resp:  [16-17] 16 (10/24 1705) BP: (111-137)/(49-97) 122/49 (10/24 1705) SpO2:  [98 %-99 %] 99 % (10/24 1705)  Physical Exam: Physical Exam Constitutional:      Appearance: He is well-developed.  HENT:     Head: Normocephalic and atraumatic.  Eyes:     Conjunctiva/sclera: Conjunctivae normal.  Cardiovascular:     Rate and Rhythm: Normal rate and regular rhythm.  Pulmonary:     Effort: Pulmonary effort is normal. No respiratory distress.  Breath sounds: No wheezing.  Abdominal:     General: There is no distension.     Palpations: Abdomen is soft.  Musculoskeletal:        General: Normal range of motion.     Cervical back: Normal range of motion and neck supple.  Skin:    General: Skin is warm and dry.     Findings: No erythema or rash.  Neurological:     General: No focal deficit present.     Mental Status: He is alert and oriented to person, place, and time.  Psychiatric:        Attention and Perception: Attention normal.        Mood and Affect: Mood normal.        Speech: Speech is delayed.        Behavior: Behavior normal.        Thought Content: Thought content normal.        Cognition and Memory: Memory is impaired.        Judgment: Judgment normal.     Wound vacuum in place  CBC:    BMET Recent Labs    01/27/21 0108 01/29/21 0123  NA 132* 137  K 5.7* 3.8  CL 100 102  CO2 24 27  GLUCOSE 215* 163*  BUN 22 15   CREATININE 0.99 1.03  CALCIUM 8.5* 8.2*     Liver Panel  No results for input(s): PROT, ALBUMIN, AST, ALT, ALKPHOS, BILITOT, BILIDIR, IBILI in the last 72 hours.     Sedimentation Rate No results for input(s): ESRSEDRATE in the last 72 hours. C-Reactive Protein No results for input(s): CRP in the last 72 hours.  Micro Results: Recent Results (from the past 720 hour(s))  Resp Panel by RT-PCR (Flu A&B, Covid) Nasopharyngeal Swab     Status: None   Collection Time: 01/08/21  1:10 AM   Specimen: Nasopharyngeal Swab; Nasopharyngeal(NP) swabs in vial transport medium  Result Value Ref Range Status   SARS Coronavirus 2 by RT PCR NEGATIVE NEGATIVE Final    Comment: (NOTE) SARS-CoV-2 target nucleic acids are NOT DETECTED.  The SARS-CoV-2 RNA is generally detectable in upper respiratory specimens during the acute phase of infection. The lowest concentration of SARS-CoV-2 viral copies this assay can detect is 138 copies/mL. A negative result does not preclude SARS-Cov-2 infection and should not be used as the sole basis for treatment or other patient management decisions. A negative result may occur with  improper specimen collection/handling, submission of specimen other than nasopharyngeal swab, presence of viral mutation(s) within the areas targeted by this assay, and inadequate number of viral copies(<138 copies/mL). A negative result must be combined with clinical observations, patient history, and epidemiological information. The expected result is Negative.  Fact Sheet for Patients:  EntrepreneurPulse.com.au  Fact Sheet for Healthcare Providers:  IncredibleEmployment.be  This test is no t yet approved or cleared by the [place] FDA and  has been authorized for detection and/or diagnosis of SARS-CoV-2 by FDA under an Emergency Use Authorization (EUA). This EUA will remain  in effect (meaning this test can be used) for the duration  of the COVID-19 declaration under Section 564(b)(1) of the Act, 21 U.S.C.section 360bbb-3(b)(1), unless the authorization is terminated  or revoked sooner.       Influenza A by PCR NEGATIVE NEGATIVE Final   Influenza B by PCR NEGATIVE NEGATIVE Final    Comment: (NOTE) The Xpert Xpress SARS-CoV-2/FLU/RSV plus assay is intended as an aid in the diagnosis of  influenza from Nasopharyngeal swab specimens and should not be used as a sole basis for treatment. Nasal washings and aspirates are unacceptable for Xpert Xpress SARS-CoV-2/FLU/RSV testing.  Fact Sheet for Patients: EntrepreneurPulse.com.au  Fact Sheet for Healthcare Providers: IncredibleEmployment.be  This test is not yet approved or cleared by the [place] FDA and has been authorized for detection and/or diagnosis of SARS-CoV-2 by FDA under an Emergency Use Authorization (EUA). This EUA will remain in effect (meaning this test can be used) for the duration of the COVID-19 declaration under Section 564(b)(1) of the Act, 21 U.S.C. section 360bbb-3(b)(1), unless the authorization is terminated or revoked.  Performed at [facility], [place] [address], [place], [place] [address]   Aerobic/Anaerobic Culture w Gram Stain (surgical/deep wound)     Status: None (Preliminary result)   Collection Time: 01/26/21  5:04 PM   Specimen: PATH Other; Tissue  Result Value Ref Range Status   Specimen Description SYNOVIAL FLUID  Final   Special Requests SPEC A  Final   Gram Stain   Final    NO SQUAMOUS EPITHELIAL CELLS SEEN FEW WBC SEEN NO ORGANISMS SEEN    Culture   Final    CULTURE REINCUBATED FOR BETTER GROWTH Performed at [facility], [place] [address], [place], [place] [address]    Report Status PENDING  Incomplete  Aerobic/Anaerobic Culture w Gram Stain (surgical/deep wound)     Status: None (Preliminary result)   Collection Time: 01/26/21  5:04 PM   Specimen: PATH Other; Tissue   Result Value Ref Range Status   Specimen Description TISSUE  Final   Special Requests IT BAND 1 SPEC B  Final   Gram Stain   Final    NO SQUAMOUS EPITHELIAL CELLS SEEN MODERATE WBC SEEN NO ORGANISMS SEEN    Culture   Final    CULTURE REINCUBATED FOR BETTER GROWTH Performed at [facility], [address], [place], [place] [address]    Report Status PENDING  Incomplete  Aerobic/Anaerobic Culture w Gram Stain (surgical/deep wound)     Status: None (Preliminary result)   Collection Time: 01/26/21  5:05 PM   Specimen: PATH Other; Tissue  Result Value Ref Range Status   Specimen Description TISSUE  Final   Special Requests IT BAND 2 SPEC C  Final   Gram Stain   Final    FEW SQUAMOUS EPITHELIAL CELLS PRESENT MODERATE WBC SEEN NO ORGANISMS SEEN    Culture   Final    CULTURE REINCUBATED FOR BETTER GROWTH Performed at [facility], [address], [place], [place] [address]    Report Status PENDING  Incomplete  Culture, blood (routine x 2)     Status: None (Preliminary result)   Collection Time: 01/26/21  9:40 PM   Specimen: BLOOD RIGHT HAND  Result Value Ref Range Status   Specimen Description BLOOD RIGHT HAND  Final   Special Requests   Final    BOTTLES DRAWN AEROBIC ONLY Blood Culture results may not be optimal due to an inadequate volume of blood received in culture bottles   Culture   Final    NO GROWTH 3 DAYS Performed at [facility], [place] [address] [place],  [address]    Report Status PENDING  Incomplete  Culture, blood (routine x 2)     Status: None (Preliminary result)   Collection Time: 01/26/21  9:51 PM   Specimen: BLOOD LEFT ARM  Result Value Ref Range Status  Specimen Description BLOOD LEFT ARM  Final   Special Requests   Final    BOTTLES DRAWN AEROBIC ONLY Blood Culture results may not be optimal due to an inadequate volume of blood received in culture bottles   Culture   Final    NO GROWTH 3 DAYS Performed at [facility], [place] [address] [place], [place] [address]    Report Status PENDING  Incomplete  Resp Panel by RT-PCR (Flu A&B, Covid) Nasopharyngeal Swab     Status: None   Collection Time: 01/29/21 10:43 AM   Specimen: Nasopharyngeal Swab; Nasopharyngeal(NP) swabs in vial transport medium  Result Value Ref Range Status   SARS Coronavirus 2 by RT PCR NEGATIVE NEGATIVE Final    Comment: (NOTE) SARS-CoV-2 target nucleic acids are NOT DETECTED.  The SARS-CoV-2 RNA is generally detectable in upper respiratory specimens during the acute phase of infection. The lowest concentration of SARS-CoV-2 viral copies this assay can detect is 138 copies/mL. A negative result does not preclude SARS-Cov-2 infection and should not be used as the sole basis for treatment or other patient management decisions. A negative result may occur with  improper specimen collection/handling, submission of specimen other than nasopharyngeal swab, presence of viral mutation(s) within the areas targeted by this assay, and inadequate number of viral copies(<138 copies/mL). A negative result must be combined with clinical observations, patient history, and epidemiological information. The expected result is Negative.  Fact Sheet for Patients:  EntrepreneurPulse.com.au  Fact Sheet for Healthcare Providers:  IncredibleEmployment.be  This test is no t yet approved or cleared by the [place] FDA and  has been authorized for detection and/or diagnosis of SARS-CoV-2 by FDA under an Emergency Use Authorization (EUA). This EUA will remain  in effect (meaning this test can be used) for the duration of the COVID-19 declaration under Section 564(b)(1) of the Act, 21 U.S.C.section 360bbb-3(b)(1), unless the authorization is terminated  or revoked sooner.       Influenza A by PCR NEGATIVE NEGATIVE Final   Influenza B by PCR NEGATIVE NEGATIVE Final    Comment: (NOTE) The Xpert Xpress  SARS-CoV-2/FLU/RSV plus assay is intended as an aid in the diagnosis of influenza from Nasopharyngeal swab specimens and should not be used as a sole basis for treatment. Nasal washings and aspirates are unacceptable for Xpert Xpress SARS-CoV-2/FLU/RSV testing.  Fact Sheet for Patients: EntrepreneurPulse.com.au  Fact Sheet for Healthcare Providers: IncredibleEmployment.be  This test is not yet approved or cleared by the [place] FDA and has been authorized for detection and/or diagnosis of SARS-CoV-2 by FDA under an Emergency Use Authorization (EUA). This EUA will remain in effect (meaning this test can be used) for the duration of the COVID-19 declaration under Section 564(b)(1) of the Act, 21 U.S.C. section 360bbb-3(b)(1), unless the authorization is terminated or revoked.  Performed at [facility], [place] [address] [place], [place] [address]     Studies/Results: No results found.    Assessment/Plan:  INTERVAL HISTORY: cultures unrevealing    Principal Problem:   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HCC) Active Problems:   Essential hypertension   Parkinsonism (HCC)   Panic disorder without agoraphobia   GERD (gastroesophageal reflux disease)   Diabetic peripheral neuropathy (HCC)   Chronic kidney disease, stage 3a (HCC)   Slow transit constipation   PAF (paroxysmal atrial fibrillation) (HCC)   ESBL (extended spectrum beta-lactamase) producing bacteria infection    Jason Hartman is a 85 y.o. male with  admission  for infected left prosthetic hip, sp I and D but retention of prosthesis (on meropenem and vancomycin)  He remains a bit confused and does not seem to understand that he HAS a deep infection  #1 Prosthetic hip infection:  We will followup intra-operative cultures  Hopefully an organism is isolated so that we can give him targeted rather than empiric treatment esp since he will require protracted oral  antibiotics after finishing IV antibiotics.  I spent36 minutes with the patient including face to face counseling of the patient personally reviewing films of the hip updated cultures datea, along with  records before and during the visit and in coordination of his care.    LOS: 3 days   Alcide Evener 01/29/2021, 5:14 PM

## 2021-01-30 DIAGNOSIS — A499 Bacterial infection, unspecified: Secondary | ICD-10-CM | POA: Diagnosis not present

## 2021-01-30 DIAGNOSIS — T8452XA Infection and inflammatory reaction due to internal left hip prosthesis, initial encounter: Secondary | ICD-10-CM | POA: Diagnosis not present

## 2021-01-30 DIAGNOSIS — A4902 Methicillin resistant Staphylococcus aureus infection, unspecified site: Secondary | ICD-10-CM

## 2021-01-30 DIAGNOSIS — E1142 Type 2 diabetes mellitus with diabetic polyneuropathy: Secondary | ICD-10-CM | POA: Diagnosis not present

## 2021-01-30 DIAGNOSIS — N1831 Chronic kidney disease, stage 3a: Secondary | ICD-10-CM | POA: Diagnosis not present

## 2021-01-30 DIAGNOSIS — A4901 Methicillin susceptible Staphylococcus aureus infection, unspecified site: Secondary | ICD-10-CM

## 2021-01-30 DIAGNOSIS — T8452XD Infection and inflammatory reaction due to internal left hip prosthesis, subsequent encounter: Secondary | ICD-10-CM | POA: Diagnosis not present

## 2021-01-30 LAB — ACID FAST SMEAR (AFB, MYCOBACTERIA)
Acid Fast Smear: NEGATIVE
Acid Fast Smear: NEGATIVE
Acid Fast Smear: NEGATIVE

## 2021-01-30 LAB — GLUCOSE, CAPILLARY
Glucose-Capillary: 197 mg/dL — ABNORMAL HIGH (ref 70–99)
Glucose-Capillary: 129 mg/dL — ABNORMAL HIGH (ref 70–99)
Glucose-Capillary: 198 mg/dL — ABNORMAL HIGH (ref 70–99)
Glucose-Capillary: 190 mg/dL — ABNORMAL HIGH (ref 70–99)

## 2021-01-30 LAB — VANCOMYCIN, PEAK: Vancomycin Pk: 33 ug/mL (ref 30–40)

## 2021-01-30 MED ORDER — POLYETHYLENE GLYCOL 3350 17 G PO PACK
17.0000 g | PACK | Freq: Every day | ORAL | Status: DC
  Administered 2021-01-30: 17 g via ORAL
  Filled 2021-01-30: qty 1

## 2021-01-30 NOTE — Progress Notes (Signed)
OT Cancellation Note  Patient Details Name: Jason Hartman MRN: [id number] DOB: 11/15/1935   Cancelled Treatment:    Reason Eval/Treat Not Completed: Patient declined, no reason specified. Pt refuses, stating his leg is "just hurting too bad to do anything". OT suggested bed level at least bed level activity, however pt maintains refusal even with max encouragement from OT and pt's wife who was present. Pt requesting OT come back tomorrow. Will follow up as able.  Helane Gunther, OT/L  [facility] [place] 01/30/2021, 3:55 PM

## 2021-01-30 NOTE — Consult Note (Signed)
[facility] CM Inpatient Consult   01/30/2021  Jason Hartman [birth year] [id number]  Patient chart has been reviewed for readmissions less than 30 days and extreme high risk score for unplanned readmissions.  Patient assessed for potential [facility] CM) post hospital follow up needs.  Per review, patient admitted from skilled nursing facility and current recommendation is for SNF. If going to SNF, patient needs will be met at that level of care.   Plan: Will continue to follow for progression.  Of note, [facility] Care Management services does not replace or interfere with any services that are arranged by inpatient case management or social work.   Netta Cedars, MSN, [facility] Liaison Nurse Mobile Phone [phone]  Toll free office [phone]

## 2021-01-30 NOTE — Progress Notes (Signed)
Subjective: No new complaints   Antibiotics:  Anti-infectives (From admission, onward)    Start     Dose/Rate Route Frequency Ordered Stop   01/27/21 2100  vancomycin (VANCOREADY) IVPB 1250 mg/250 mL        1,250 mg 166.7 mL/hr over 90 Minutes Intravenous Every 24 hours 01/26/21 1957     01/27/21 1200  meropenem (MERREM) 2 g in sodium chloride 0.9 % 100 mL IVPB        2 g 200 mL/hr over 30 Minutes Intravenous Every 8 hours 01/27/21 1103     01/26/21 2100  cefTRIAXone (ROCEPHIN) 2 g in sodium chloride 0.9 % 100 mL IVPB  Status:  Discontinued        2 g 200 mL/hr over 30 Minutes Intravenous Every 24 hours 01/26/21 1943 01/27/21 1051   01/26/21 2100  vancomycin (VANCOREADY) IVPB 1500 mg/300 mL        1,500 mg 150 mL/hr over 120 Minutes Intravenous  Once 01/26/21 1952 01/27/21 0028   01/26/21 1230  ceFAZolin (ANCEF) IVPB 2g/100 mL premix        2 g 200 mL/hr over 30 Minutes Intravenous On call to O.R. 01/26/21 1224 01/26/21 1710       Medications: Scheduled Meds:  amiodarone  200 mg Oral Daily   clonazePAM  0.25 mg Oral Daily   clonazePAM  0.5 mg Oral QHS   docusate sodium  100 mg Oral BID   famotidine  20 mg Oral Daily   feeding supplement  237 mL Oral TID BM   ferrous sulfate  325 mg Oral Q breakfast   gabapentin  300 mg Oral QHS   heparin  5,000 Units Subcutaneous Q8H   insulin aspart  0-5 Units Subcutaneous QHS   insulin aspart  0-9 Units Subcutaneous TID WC   insulin glargine-yfgn  4 Units Subcutaneous BID   melatonin  5 mg Oral QHS   metoprolol tartrate  12.5 mg Oral BID   multivitamin with minerals  1 tablet Oral Daily   OLANZapine  2.5 mg Oral QHS   polyethylene glycol  17 g Oral Daily   Rotigotine  1 patch Transdermal QHS   thiamine  100 mg Oral Daily   traMADol  75 mg Oral BID   Continuous Infusions:  meropenem (MERREM) IV 2 g (01/30/21 1527)   vancomycin 1,250 mg (01/29/21 2110)   PRN Meds:.HYDROmorphone (DILAUDID) injection, methocarbamol,  OLANZapine, ondansetron **OR** ondansetron (ZOFRAN) IV    Objective: Weight change:   Intake/Output Summary (Last 24 hours) at 01/30/2021 1736 Last data filed at 01/30/2021 1527 Gross per 24 hour  Intake 1500.68 ml  Output 1001 ml  Net 499.68 ml    Blood pressure 140/75, pulse 94, temperature 98.3 F (36.8 C), temperature source Oral, resp. rate 17, height 5\' 10"  (1.778 m), weight 70.3 kg, SpO2 98 %. Temp:  [98 F (36.7 C)-98.3 F (36.8 C)] 98.3 F (36.8 C) (10/25 1501) Pulse Rate:  [75-94] 94 (10/25 1501) Resp:  [16-18] 17 (10/25 1501) BP: (127-140)/(50-75) 140/75 (10/25 1501) SpO2:  [98 %-100 %] 98 % (10/25 1501)  Physical Exam: Physical Exam Constitutional:      Appearance: He is well-developed.  HENT:     Head: Normocephalic and atraumatic.  Eyes:     Conjunctiva/sclera: Conjunctivae normal.  Cardiovascular:     Rate and Rhythm: Normal rate and regular rhythm.  Pulmonary:     Effort: Pulmonary effort is normal. No respiratory distress.  Breath sounds: Normal breath sounds. No stridor. No wheezing.  Abdominal:     General: There is no distension.     Palpations: Abdomen is soft.  Musculoskeletal:        General: Normal range of motion.     Cervical back: Normal range of motion and neck supple.  Skin:    General: Skin is warm and dry.     Findings: No erythema or rash.  Neurological:     General: No focal deficit present.     Mental Status: He is alert and oriented to person, place, and time.  Psychiatric:        Mood and Affect: Mood is depressed.        Speech: Speech is delayed.        Behavior: Behavior is cooperative.        Thought Content: Thought content normal.        Cognition and Memory: Cognition is impaired. Memory is impaired.     Wound vacuum in place  CBC:    BMET Recent Labs    01/29/21 0123  NA 137  K 3.8  CL 102  CO2 27  GLUCOSE 163*  BUN 15  CREATININE 1.03  CALCIUM 8.2*      Liver Panel  No results for  input(s): PROT, ALBUMIN, AST, ALT, ALKPHOS, BILITOT, BILIDIR, IBILI in the last 72 hours.     Sedimentation Rate No results for input(s): ESRSEDRATE in the last 72 hours. C-Reactive Protein No results for input(s): CRP in the last 72 hours.  Micro Results: Recent Results (from the past 720 hour(s))  Resp Panel by RT-PCR (Flu A&B, Covid) Nasopharyngeal Swab     Status: None   Collection Time: 01/08/21  1:10 AM   Specimen: Nasopharyngeal Swab; Nasopharyngeal(NP) swabs in vial transport medium  Result Value Ref Range Status   SARS Coronavirus 2 by RT PCR NEGATIVE NEGATIVE Final    Comment: (NOTE) SARS-CoV-2 target nucleic acids are NOT DETECTED.  The SARS-CoV-2 RNA is generally detectable in upper respiratory specimens during the acute phase of infection. The lowest concentration of SARS-CoV-2 viral copies this assay can detect is 138 copies/mL. A negative result does not preclude SARS-Cov-2 infection and should not be used as the sole basis for treatment or other patient management decisions. A negative result may occur with  improper specimen collection/handling, submission of specimen other than nasopharyngeal swab, presence of viral mutation(s) within the areas targeted by this assay, and inadequate number of viral copies(<138 copies/mL). A negative result must be combined with clinical observations, patient history, and epidemiological information. The expected result is Negative.  Fact Sheet for Patients:  EntrepreneurPulse.com.au  Fact Sheet for Healthcare Providers:  IncredibleEmployment.be  This test is no t yet approved or cleared by the [place] FDA and  has been authorized for detection and/or diagnosis of SARS-CoV-2 by FDA under an Emergency Use Authorization (EUA). This EUA will remain  in effect (meaning this test can be used) for the duration of the COVID-19 declaration under Section 564(b)(1) of the Act,  21 U.S.C.section 360bbb-3(b)(1), unless the authorization is terminated  or revoked sooner.       Influenza A by PCR NEGATIVE NEGATIVE Final   Influenza B by PCR NEGATIVE NEGATIVE Final    Comment: (NOTE) The Xpert Xpress SARS-CoV-2/FLU/RSV plus assay is intended as an aid in the diagnosis of influenza from Nasopharyngeal swab specimens and should not be used as a sole basis for treatment. Nasal washings and aspirates are  unacceptable for Xpert Xpress SARS-CoV-2/FLU/RSV testing.  Fact Sheet for Patients: EntrepreneurPulse.com.au  Fact Sheet for Healthcare Providers: IncredibleEmployment.be  This test is not yet approved or cleared by the [place] FDA and has been authorized for detection and/or diagnosis of SARS-CoV-2 by FDA under an Emergency Use Authorization (EUA). This EUA will remain in effect (meaning this test can be used) for the duration of the COVID-19 declaration under Section 564(b)(1) of the Act, 21 U.S.C. section 360bbb-3(b)(1), unless the authorization is terminated or revoked.  Performed at [facility], [place] [address] [place], [place] [address]   Aerobic/Anaerobic Culture w Gram Stain (surgical/deep wound)     Status: None (Preliminary result)   Collection Time: 01/26/21  5:04 PM   Specimen: PATH Other; Tissue  Result Value Ref Range Status   Specimen Description SYNOVIAL FLUID  Final   Special Requests SPEC A  Final   Gram Stain   Final    NO SQUAMOUS EPITHELIAL CELLS SEEN FEW WBC SEEN NO ORGANISMS SEEN    Culture   Final    FEW STAPHYLOCOCCUS AUREUS RARE KLEBSIELLA PNEUMONIAE Confirmed Extended Spectrum Beta-Lactamase Producer (ESBL).  In bloodstream infections from ESBL organisms, carbapenems are preferred over piperacillin/tazobactam. They are shown to have a lower risk of mortality. HOLDING FOR POSSIBLE ANAEROBE Performed at [facility], [place] [address] [place], [place] [address]    Report  Status PENDING  Incomplete   Organism ID, Bacteria KLEBSIELLA PNEUMONIAE  Final      Susceptibility   Klebsiella pneumoniae - MIC*    AMPICILLIN >=32 RESISTANT Resistant     CEFAZOLIN >=64 RESISTANT Resistant     CEFEPIME 2 SENSITIVE Sensitive     CEFTAZIDIME RESISTANT Resistant     CEFTRIAXONE >=64 RESISTANT Resistant     CIPROFLOXACIN 2 RESISTANT Resistant     GENTAMICIN >=16 RESISTANT Resistant     IMIPENEM <=0.25 SENSITIVE Sensitive     TRIMETH/SULFA >=320 RESISTANT Resistant     AMPICILLIN/SULBACTAM >=32 RESISTANT Resistant     PIP/TAZO 16 SENSITIVE Sensitive     * RARE KLEBSIELLA PNEUMONIAE  Aerobic/Anaerobic Culture w Gram Stain (surgical/deep wound)     Status: None (Preliminary result)   Collection Time: 01/26/21  5:04 PM   Specimen: PATH Other; Tissue  Result Value Ref Range Status   Specimen Description TISSUE  Final   Special Requests IT BAND 1 SPEC B  Final   Gram Stain   Final    NO SQUAMOUS EPITHELIAL CELLS SEEN MODERATE WBC SEEN NO ORGANISMS SEEN    Culture   Final    RARE STAPHYLOCOCCUS AUREUS HOLDING FOR POSSIBLE ANAEROBE Performed at [facility], [address], [place], [place] [address]    Report Status PENDING  Incomplete  Acid Fast Smear (AFB)     Status: None   Collection Time: 01/26/21  5:04 PM   Specimen: PATH Other; Tissue  Result Value Ref Range Status   AFB Specimen Processing Concentration  Final   Acid Fast Smear Negative  Final    Comment: (NOTE) Performed At: [facility] [place], [place] [id number] Rush Farmer MD [id number]    Source (AFB) SYNOVIAL FLUID  Final    Comment: Performed at [facility], [place] [address], [place], [place] [address]  Acid Fast Smear (AFB)     Status: None   Collection Time: 01/26/21  5:04 PM   Specimen: PATH Other; Tissue  Result Value Ref Range Status   AFB  Specimen Processing Comment  Final    Comment: Tissue Grinding and Digestion/Decontamination   Acid Fast  Smear Negative  Final    Comment: (NOTE) Performed At: [facility] [place], [place] [id number] Rush Farmer MD FI:[id number]    Source (AFB) IT BAND 1  Final    Comment: Performed at [facility], [place] [address], [place], [place] [address]  Aerobic/Anaerobic Culture w Gram Stain (surgical/deep wound)     Status: None (Preliminary result)   Collection Time: 01/26/21  5:05 PM   Specimen: PATH Other; Tissue  Result Value Ref Range Status   Specimen Description TISSUE  Final   Special Requests IT BAND 2 SPEC C  Final   Gram Stain   Final    FEW SQUAMOUS EPITHELIAL CELLS PRESENT MODERATE WBC SEEN NO ORGANISMS SEEN Performed at [facility], [place] [address] [place], [place] [address]    Culture   Final    RARE STAPHYLOCOCCUS AUREUS RARE KLEBSIELLA PNEUMONIAE SUSCEPTIBILITIES TO FOLLOW NO ANAEROBES ISOLATED; CULTURE IN PROGRESS FOR 5 DAYS    Report Status PENDING  Incomplete  Acid Fast Smear (AFB)     Status: None   Collection Time: 01/26/21  5:05 PM   Specimen: PATH Other; Tissue  Result Value Ref Range Status   AFB Specimen Processing Comment  Final    Comment: Tissue Grinding and Digestion/Decontamination   Acid Fast Smear Negative  Final    Comment: (NOTE) Performed At: [facility] [place], [place] [id number] Rush Farmer MD DU:[id number]    Source (AFB) IT BAND 2  Final    Comment: Performed at [facility], [place] [address], [place], [place] [address]  Culture, blood (routine x 2)     Status: None (Preliminary result)   Collection Time: 01/26/21  9:40 PM   Specimen: BLOOD RIGHT HAND  Result Value Ref Range Status   Specimen Description BLOOD RIGHT HAND  Final   Special Requests   Final    BOTTLES DRAWN AEROBIC ONLY Blood Culture results may not be optimal due to an inadequate volume of blood received in culture bottles   Culture   Final    NO GROWTH 4 DAYS Performed at [facility], [place]  [address], [place], [place] [address]    Report Status PENDING  Incomplete  Culture, blood (routine x 2)     Status: None (Preliminary result)   Collection Time: 01/26/21  9:51 PM   Specimen: BLOOD LEFT ARM  Result Value Ref Range Status   Specimen Description BLOOD LEFT ARM  Final   Special Requests   Final    BOTTLES DRAWN AEROBIC ONLY Blood Culture results may not be optimal due to an inadequate volume of blood received in culture bottles   Culture   Final    NO GROWTH 4 DAYS Performed at [facility], [place] [address], [place],  [address]    Report Status PENDING  Incomplete  Resp Panel by RT-PCR (Flu A&B, Covid) Nasopharyngeal Swab     Status: None   Collection Time: 01/29/21 10:43 AM   Specimen: Nasopharyngeal Swab; Nasopharyngeal(NP) swabs in vial transport medium  Result Value Ref Range Status   SARS Coronavirus 2 by RT PCR NEGATIVE NEGATIVE Final    Comment: (NOTE) SARS-CoV-2 target nucleic acids are NOT DETECTED.  The SARS-CoV-2 RNA is generally detectable in upper respiratory specimens during the acute phase of infection. The lowest concentration of SARS-CoV-2 viral  copies this assay can detect is 138 copies/mL. A negative result does not preclude SARS-Cov-2 infection and should not be used as the sole basis for treatment or other patient management decisions. A negative result may occur with  improper specimen collection/handling, submission of specimen other than nasopharyngeal swab, presence of viral mutation(s) within the areas targeted by this assay, and inadequate number of viral copies(<138 copies/mL). A negative result must be combined with clinical observations, patient history, and epidemiological information. The expected result is Negative.  Fact Sheet for Patients:  EntrepreneurPulse.com.au  Fact Sheet for Healthcare Providers:  IncredibleEmployment.be  This test is no t yet approved or cleared by the [place] FDA and  has been authorized for detection and/or diagnosis of SARS-CoV-2 by FDA under an Emergency Use Authorization (EUA). This EUA will remain  in effect (meaning this test can be used) for the duration of the COVID-19 declaration under Section 564(b)(1) of the Act, 21 U.S.C.section 360bbb-3(b)(1), unless the authorization is terminated  or revoked sooner.       Influenza A by PCR NEGATIVE NEGATIVE Final   Influenza B by PCR NEGATIVE NEGATIVE Final    Comment: (NOTE) The Xpert Xpress SARS-CoV-2/FLU/RSV plus assay is intended as an aid in the diagnosis of influenza from Nasopharyngeal swab specimens and should not be used as a sole basis for treatment. Nasal washings and aspirates are unacceptable for Xpert Xpress SARS-CoV-2/FLU/RSV testing.  Fact Sheet for Patients: EntrepreneurPulse.com.au  Fact Sheet for Healthcare Providers: IncredibleEmployment.be  This test is not yet approved or cleared by the [place] FDA and has been authorized for detection and/or diagnosis of SARS-CoV-2 by FDA under an Emergency Use Authorization (EUA). This EUA will remain in effect (meaning this test can be used) for the duration of the COVID-19 declaration under Section 564(b)(1) of the Act, 21 U.S.C. section 360bbb-3(b)(1), unless the authorization is terminated or revoked.  Performed at [facility], [place] [address], [place], [place] [address]     Studies/Results: No results found.    Assessment/Plan:  INTERVAL HISTORY:   Unfortunately he is growing an ESBL klebsiella pneumoniae and Staphylococcus Aureus   Principal Problem:   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HCC) Active Problems:   Essential hypertension   Parkinsonism (HCC)   Panic disorder without agoraphobia   GERD (gastroesophageal reflux disease)   Diabetic peripheral neuropathy (HCC)   Chronic kidney disease, stage 3a (HCC)   Slow transit constipation   PAF  (paroxysmal atrial fibrillation) (HCC)   ESBL (extended spectrum beta-lactamase) producing bacteria infection    Jason Hartman is a 85 y.o. male with  admission for infected left prosthetic hip, sp I and D but retention of prosthesis (on meropenem and vancomycin)   #1 Prosthetic hip infection:  Unfortunately he has been found to have ESBL klebsiella and Staphylococcus aureus  The former organism is MDR without readily available oral options  Continue vancomycin and meropenem for now  Will switch to vancomycin and ertapenem and DC and treat for at least 2 months  We could then try to procure Omadacycline but my experience with medicare patients is that they still face copays with coverage of 1.6K to 3K in copays  I do NOT think that chronic suppression is feasible but we can try  If there is any possibility of explantation of prosthesis this would make treatmetn and cure possible.  If not this will as I mention be an infection that will likely be not suppressed and will recur.  I spent more than 36 minutes with the patient including face to face counseling of the patient re his PJI personally reviewing CT abdomen and pelvis , along with updated culture data, and with  review of medical records before and during the visit and in coordination of his care.      LOS: 4 days   Alcide Evener 01/30/2021, 5:36 PM

## 2021-01-30 NOTE — Progress Notes (Signed)
Pharmacy Antibiotic Note  Jason Hartman is a 85 y.o. male admitted on 01/26/2021 with  L hip prosthetic joint infection/osteomyelitis . The patient is now s/p I&D of L hip hemiarthroplasty wound on 10/21, likely not a candidate for resection arthroplasty. The patient has a history of MRSA and ESBL and is being seen by ID. Pharmacy has been consulted for meropenem and vancomycin dosing.  10/25 AM update:  Vancomycin AUC is therapeutic at 550 Renal function stable   Plan: -Cont Vancomycin 1250 mg IV q24h -Cont Merrem 2g IV q8h -Monitor clinical status, renal function, ID/ortho recommendations, and length of therapy  -Repeat vancomycin peaks and troughs as clinically indicated  Height: 5\' 10"  (177.8 cm) Weight: 70.3 kg (155 lb) IBW/kg (Calculated) : 73  Temp (24hrs), Avg:98.4 F (36.9 C), Min:98 F (36.7 C), Max:98.6 F (37 C)  Recent Labs  Lab 01/26/21 2140 01/27/21 0108 01/29/21 0123 01/29/21 2033 01/30/21 0000  WBC  --  11.3* 8.2  --   --   CREATININE 1.07 0.99 1.03  --   --   VANCOTROUGH  --   --   --  14*  --   VANCOPEAK  --   --   --   --  33     Estimated Creatinine Clearance: 55 mL/min (by C-G formula based on SCr of 1.03 mg/dL).    Allergies  Allergen Reactions   Oxycodone Anaphylaxis   Iodinated Diagnostic Agents Hives    alleric to renografin,isovue & omnipaque, hives, requires 13 hr prep//a.calhoun, Onset Date: [id number]      Iodine Hives and Rash    alleric to renografin,isovue & omnipaque, hives, requires 13 hr prep//a.calhoun, Onset Date: [id number] allergic to renografin,isovue & omnipaque, hives, requires 13 hr prep//a.calhoun, Onset Date: [id number]   Linezolid Hives and Rash        Methadone Hcl Other (See Comments)    hallucinations    Promethazine Other (See Comments)    Delirium (pulled out IV), erratic behavior Mental status change     Isovue [Iopamidol] Hives   Methadone    Morphine Sulfate Other (See Comments)    Unknown reaction    Omnipaque [Iohexol] Hives    Code: HIVES, Desc: alleric to renografin,isovue & omnipaque, hives, requires 13 hr prep//a.calhoun, Onset Date: [id number]    Quetiapine Other (See Comments)    Unknown reaction   Renografin [Diatrizoate] Hives   Statins Other (See Comments)    Muscle weakness   Zolpidem Other (See Comments)    Erratic behavior/delusions Altered mental status   Atorvastatin Itching and Other (See Comments)    Tired, weakness    Carbidopa-Levodopa Anxiety   Chlorhexidine Gluconate [Chlorhexidine] Hives and Rash   Clindamycin Rash   Colesevelam Other (See Comments)    tired    Doxazosin Rash   Lovastatin Other (See Comments)    Tired, nervousness    Rosuvastatin Other (See Comments)    Tired, weakness    Narda Bonds, PharmD, BCPS Clinical Pharmacist Phone: [phone]

## 2021-01-30 NOTE — Progress Notes (Signed)
   Subjective:  Sleeping comfortably in bed. Tolerating diet. Refusing mobility with PT. Cultures remain NGTD.  Objective:   VITALS:   Vitals:   01/29/21 0734 01/29/21 1705 01/29/21 2108 01/30/21 0610  BP: (!) 111/97 (!) 122/49 140/67 (!) 127/59  Pulse: 89 77 86 78  Resp: _0 Temp: 98 F (36.7 C) 98.5 F (36.9 C) 98.1 F (36.7 C) 98 F (36.7 C)  TempSrc: Oral Oral Oral Oral  SpO2: 99% 99% 98% 98%  Weight:      Height:        Neurologically intact Neurovascular intact Intact pulses distally Vac in place holding suction  Lab Results  Component Value Date   WBC 8.2 01/29/2021   HGB 8.5 (L) 01/29/2021   HCT 27.9 (L) 01/29/2021   MCV 92.4 01/29/2021   PLT 260 01/29/2021     Assessment/Plan:  4 Days Post-Op   - Expected postop acute blood loss anemia - will monitor for symptoms - Patient to work with PT/OT to optimize mobilization safely - DVT ppx - SCDs, ambulation, heparin 5000 TID - Appreciate ID recs, fu cultures (NGTD), recommend ESR/CRP trending to assess efficacy of antibiotics - WBAT operative extremity - Pain control - multimodal pain management, ATC acetaminophen in conjunction with as needed narcotic (oxycodone), although this should be minimized with other modalities    Jason Hartman 01/30/2021, 7:51 AM

## 2021-01-30 NOTE — Progress Notes (Signed)
PROGRESS NOTE    Jason Hartman  DTO:[id number] DOB: [birth year] DOA: 01/26/2021 PCP: [name], MD    Brief Narrative:  85 year old gentleman with history of recent left hip hemiarthroplasty on 9/26 after a fall, has history of Parkinson's, essential hypertension, CKD stage IIIa and paroxysmal A. fib along with previous history of ESBL who presented from skilled nursing rehab with ongoing left hip wound.  Patient was brought to orthopedics office with drainage from the site of the hip hemiarthroplasty that was done on 9/26.  He also suffered a fall at rehab.  He was taking oral antibiotics for presumed infection of the hip.  Patient was brought to the operating room and admitted to hospitalist service after undergoing debridement and washout on 10/21.   Assessment & Plan:   Principal Problem:   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HCC) Active Problems:   Essential hypertension   Parkinsonism (HCC)   Panic disorder without agoraphobia   GERD (gastroesophageal reflux disease)   Diabetic peripheral neuropathy (HCC)   Chronic kidney disease, stage 3a (HCC)   Slow transit constipation   PAF (paroxysmal atrial fibrillation) (HCC)   ESBL (extended spectrum beta-lactamase) producing bacteria infection  Prosthetic joint infection of the left hip: 10/21, debridement and wound VAC placement.  Followed by orthopedics and infectious disease.  Not a multistage surgical procedure candidate due to extensive underlying medical issues.  Probably long-term antibiotics treatment. Currently remains on vancomycin and meropenem, surgical wound cultures with rare staph aureus and ESBL Klebsiella.  Followed by ID.  Anticipate prolonged IV antibiotics. Pain relief and continue to work with PT OT to increase mobility and endurance.  Weightbearing as tolerated as per orthopedics.  Essential hypertension: Blood pressure fairly stable.  Antihypertensives with holding parameters.  Paroxysmal A. fib: Rate  controlled on amiodarone and metoprolol.  Eliquis on hold.  Once no further procedures planned, will continue.  Type 2 diabetes with peripheral neuropathy: Oral hypoglycemics on hold.  Blood sugars are fairly stable.  Continue Lantus and sliding scale insulin.  Anxiety/panic attacks: Patient on Klonopin, melatonin at night, gabapentin and Zyprexa at night.  Continued.  Continue to work with PT OT.  Adequate pain management.  Antibiotics plans to finalize before transitioning back to skilled nursing facility.   DVT prophylaxis: heparin injection 5,000 Units Start: 01/27/21 0600 SCDs Start: 01/26/21 1944 Place TED hose Start: 01/26/21 1944   Code Status: Full code Family Communication: None at bedside Disposition Plan: Status is: Inpatient  Remains inpatient appropriate because: Active surgical procedures.  IV antibiotics.         Consultants:  Infectious disease Orthopedics  Procedures:  Debridement left hip  Antimicrobials:  Vancomycin meropenem 10/21---   Subjective: Patient seen and examined.  Denies any complaints at rest.  He is wondering how he will do on walking.  Objective: Vitals:   01/29/21 1705 01/29/21 2108 01/30/21 0610 01/30/21 0840  BP: (!) 122/49 140/67 (!) 127/59 (!) 132/50  Pulse: 77 86 78 75  Resp: 16 16 16 18   Temp: 98.5 F (36.9 C) 98.1 F (36.7 C) 98 F (36.7 C) 98 F (36.7 C)  TempSrc: Oral Oral Oral Oral  SpO2: 99% 98% 98% 100%  Weight:      Height:        Intake/Output Summary (Last 24 hours) at 01/30/2021 1249 Last data filed at 01/30/2021 1006 Gross per 24 hour  Intake 2020.73 ml  Output 1250 ml  Net 770.73 ml   Filed Weights   01/26/21 1226  Weight: 70.3 kg    Examination:  General exam: Frail.  Debilitated.  In mild distress and anxious.  Not in pain at rest.  Chronically sick looking. Respiratory system: Clear to auscultation. Respiratory effort normal.  No added sounds. Cardiovascular system: S1 & S2 heard, RRR. No  JVD, murmurs, rubs, gallops or clicks.  Gastrointestinal system: Abdomen is nondistended, soft and nontender. No organomegaly or masses felt. Normal bowel sounds heard. Central nervous system: Alert and oriented.  Generalized weakness. Extremities:  Left hip with open incision, fitted with wound VAC.  Minimal clear drainage on the canister.  Distal neurovascular status intact.    Data Reviewed: I have personally reviewed following labs and imaging studies  CBC: Recent Labs  Lab 01/27/21 0108 01/29/21 0123  WBC 11.3* 8.2  HGB 8.5* 8.5*  HCT 27.8* 27.9*  MCV 91.4 92.4  PLT 305 740   Basic Metabolic Panel: Recent Labs  Lab 01/26/21 2140 01/27/21 0108 01/29/21 0123  NA 129* 132* 137  K 5.1 5.7* 3.8  CL 98 100 102  CO2 21* 24 27  GLUCOSE 220* 215* 163*  BUN 20 22 15   CREATININE 1.07 0.99 1.03  CALCIUM 8.4* 8.5* 8.2*   GFR: Estimated Creatinine Clearance: 55 mL/min (by C-G formula based on SCr of 1.03 mg/dL). Liver Function Tests: No results for input(s): AST, ALT, ALKPHOS, BILITOT, PROT, ALBUMIN in the last 168 hours. No results for input(s): LIPASE, AMYLASE in the last 168 hours. No results for input(s): AMMONIA in the last 168 hours. Coagulation Profile: No results for input(s): INR, PROTIME in the last 168 hours. Cardiac Enzymes: No results for input(s): CKTOTAL, CKMB, CKMBINDEX, TROPONINI in the last 168 hours. BNP (last 3 results) No results for input(s): PROBNP in the last 8760 hours. HbA1C: No results for input(s): HGBA1C in the last 72 hours.  CBG: Recent Labs  Lab 01/29/21 1114 01/29/21 1641 01/29/21 2105 01/30/21 0837 01/30/21 1225  GLUCAP 158* 166* 235* 197* 129*   Lipid Profile: No results for input(s): CHOL, HDL, LDLCALC, TRIG, CHOLHDL, LDLDIRECT in the last 72 hours. Thyroid Function Tests: No results for input(s): TSH, T4TOTAL, FREET4, T3FREE, THYROIDAB in the last 72 hours. Anemia Panel: No results for input(s): VITAMINB12, FOLATE,  FERRITIN, TIBC, IRON, RETICCTPCT in the last 72 hours. Sepsis Labs: No results for input(s): PROCALCITON, LATICACIDVEN in the last 168 hours.  Recent Results (from the past 240 hour(s))  Aerobic/Anaerobic Culture w Gram Stain (surgical/deep wound)     Status: None (Preliminary result)   Collection Time: 01/26/21  5:04 PM   Specimen: PATH Other; Tissue  Result Value Ref Range Status   Specimen Description SYNOVIAL FLUID  Final   Special Requests SPEC A  Final   Gram Stain   Final    NO SQUAMOUS EPITHELIAL CELLS SEEN FEW WBC SEEN NO ORGANISMS SEEN Performed at [facility], [address], [place], [place] [address]    Culture   Final    FEW STAPHYLOCOCCUS AUREUS RARE KLEBSIELLA PNEUMONIAE Confirmed Extended Spectrum Beta-Lactamase Producer (ESBL).  In bloodstream infections from ESBL organisms, carbapenems are preferred over piperacillin/tazobactam. They are shown to have a lower risk of mortality.    Report Status PENDING  Incomplete   Organism ID, Bacteria KLEBSIELLA PNEUMONIAE  Final      Susceptibility   Klebsiella pneumoniae - MIC*    AMPICILLIN >=32 RESISTANT Resistant     CEFAZOLIN >=64 RESISTANT Resistant     CEFEPIME 2 SENSITIVE Sensitive     CEFTAZIDIME RESISTANT  Resistant     CEFTRIAXONE >=64 RESISTANT Resistant     CIPROFLOXACIN 2 RESISTANT Resistant     GENTAMICIN >=16 RESISTANT Resistant     IMIPENEM <=0.25 SENSITIVE Sensitive     TRIMETH/SULFA >=320 RESISTANT Resistant     AMPICILLIN/SULBACTAM >=32 RESISTANT Resistant     PIP/TAZO 16 SENSITIVE Sensitive     * RARE KLEBSIELLA PNEUMONIAE  Aerobic/Anaerobic Culture w Gram Stain (surgical/deep wound)     Status: None (Preliminary result)   Collection Time: 01/26/21  5:04 PM   Specimen: PATH Other; Tissue  Result Value Ref Range Status   Specimen Description TISSUE  Final   Special Requests IT BAND 1 SPEC B  Final   Gram Stain   Final    NO SQUAMOUS EPITHELIAL CELLS SEEN MODERATE WBC SEEN NO ORGANISMS  SEEN Performed at [facility], [place] [address], [place], [place] [address]    Culture RARE STAPHYLOCOCCUS AUREUS  Final   Report Status PENDING  Incomplete  Acid Fast Smear (AFB)     Status: None   Collection Time: 01/26/21  5:04 PM   Specimen: PATH Other; Tissue  Result Value Ref Range Status   AFB Specimen Processing Concentration  Final   Acid Fast Smear Negative  Final    Comment: (NOTE) Performed At: [facility] [place], [place] [id number] Rush Farmer MD [id number]    Source (AFB) SYNOVIAL FLUID  Final    Comment: Performed at [facility], [place] [address], [place], [place] [address]  Acid Fast Smear (AFB)     Status: None   Collection Time: 01/26/21  5:04 PM   Specimen: PATH Other; Tissue  Result Value Ref Range Status   AFB Specimen Processing Comment  Final    Comment: Tissue Grinding and Digestion/Decontamination   Acid Fast Smear Negative  Final    Comment: (NOTE) Performed At: [facility] [place], [place] [id number] Rush Farmer MD [id number]    Source (AFB) IT BAND 1  Final    Comment: Performed at [facility], [place] [address], [place], [place] [address]  Aerobic/Anaerobic Culture w Gram Stain (surgical/deep wound)     Status: None (Preliminary result)   Collection Time: 01/26/21  5:05 PM   Specimen: PATH Other; Tissue  Result Value Ref Range Status   Specimen Description TISSUE  Final   Special Requests IT BAND 2 SPEC C  Final   Gram Stain   Final    FEW SQUAMOUS EPITHELIAL CELLS PRESENT MODERATE WBC SEEN NO ORGANISMS SEEN    Culture   Final    RARE STAPHYLOCOCCUS AUREUS RARE KLEBSIELLA PNEUMONIAE SUSCEPTIBILITIES TO FOLLOW Performed at [facility], [place] [address] [place], [place] [address]    Report Status PENDING  Incomplete  Acid Fast Smear (AFB)     Status: None   Collection Time: 01/26/21  5:05 PM   Specimen: PATH Other; Tissue  Result Value Ref Range Status    AFB Specimen Processing Comment  Final    Comment: Tissue Grinding and Digestion/Decontamination   Acid Fast Smear Negative  Final    Comment: (NOTE) Performed At: [facility] [place], [place] [id number] Rush Farmer MD [id number]    Source (AFB) IT BAND 2  Final    Comment: Performed at [facility], [place] [address], [place], [place] [address]  Culture, blood (routine x 2)     Status: None (Preliminary result)   Collection  Time: 01/26/21  9:40 PM   Specimen: BLOOD RIGHT HAND  Result Value Ref Range Status   Specimen Description BLOOD RIGHT HAND  Final   Special Requests   Final    BOTTLES DRAWN AEROBIC ONLY Blood Culture results may not be optimal due to an inadequate volume of blood received in culture bottles   Culture   Final    NO GROWTH 4 DAYS Performed at [facility], [place] [address], [place], [place] [address]    Report Status PENDING  Incomplete  Culture, blood (routine x 2)     Status: None (Preliminary result)   Collection Time: 01/26/21  9:51 PM   Specimen: BLOOD LEFT ARM  Result Value Ref Range Status   Specimen Description BLOOD LEFT ARM  Final   Special Requests   Final    BOTTLES DRAWN AEROBIC ONLY Blood Culture results may not be optimal due to an inadequate volume of blood received in culture bottles   Culture   Final    NO GROWTH 4 DAYS Performed at [facility], [place] [address] [place], [place] [address]    Report Status PENDING  Incomplete  Resp Panel by RT-PCR (Flu A&B, Covid) Nasopharyngeal Swab     Status: None   Collection Time: 01/29/21 10:43 AM   Specimen: Nasopharyngeal Swab; Nasopharyngeal(NP) swabs in vial transport medium  Result Value Ref Range Status   SARS Coronavirus 2 by RT PCR NEGATIVE NEGATIVE Final    Comment: (NOTE) SARS-CoV-2 target nucleic acids are NOT DETECTED.  The SARS-CoV-2 RNA is generally detectable in upper respiratory specimens during the acute phase of infection. The  lowest concentration of SARS-CoV-2 viral copies this assay can detect is 138 copies/mL. A negative result does not preclude SARS-Cov-2 infection and should not be used as the sole basis for treatment or other patient management decisions. A negative result may occur with  improper specimen collection/handling, submission of specimen other than nasopharyngeal swab, presence of viral mutation(s) within the areas targeted by this assay, and inadequate number of viral copies(<138 copies/mL). A negative result must be combined with clinical observations, patient history, and epidemiological information. The expected result is Negative.  Fact Sheet for Patients:  EntrepreneurPulse.com.au  Fact Sheet for Healthcare Providers:  IncredibleEmployment.be  This test is no t yet approved or cleared by the [place] FDA and  has been authorized for detection and/or diagnosis of SARS-CoV-2 by FDA under an Emergency Use Authorization (EUA). This EUA will remain  in effect (meaning this test can be used) for the duration of the COVID-19 declaration under Section 564(b)(1) of the Act, 21 U.S.C.section 360bbb-3(b)(1), unless the authorization is terminated  or revoked sooner.       Influenza A by PCR NEGATIVE NEGATIVE Final   Influenza B by PCR NEGATIVE NEGATIVE Final    Comment: (NOTE) The Xpert Xpress SARS-CoV-2/FLU/RSV plus assay is intended as an aid in the diagnosis of influenza from Nasopharyngeal swab specimens and should not be used as a sole basis for treatment. Nasal washings and aspirates are unacceptable for Xpert Xpress SARS-CoV-2/FLU/RSV testing.  Fact Sheet for Patients: EntrepreneurPulse.com.au  Fact Sheet for Healthcare Providers: IncredibleEmployment.be  This test is not yet approved or cleared by the [place] FDA and has been authorized for detection and/or diagnosis of SARS-CoV-2 by FDA under  an Emergency Use Authorization (EUA). This EUA will remain in effect (meaning this test can be used) for the duration of the COVID-19 declaration under Section 564(b)(1) of the  Act, 21 U.S.C. section 360bbb-3(b)(1), unless the authorization is terminated or revoked.  Performed at [facility], [place] [address] [place], [place] [address]          Radiology Studies: No results found.      Scheduled Meds:  amiodarone  200 mg Oral Daily   clonazePAM  0.25 mg Oral Daily   clonazePAM  0.5 mg Oral QHS   docusate sodium  100 mg Oral BID   famotidine  20 mg Oral Daily   feeding supplement  237 mL Oral TID BM   ferrous sulfate  325 mg Oral Q breakfast   gabapentin  300 mg Oral QHS   heparin  5,000 Units Subcutaneous Q8H   insulin aspart  0-5 Units Subcutaneous QHS   insulin aspart  0-9 Units Subcutaneous TID WC   insulin glargine-yfgn  4 Units Subcutaneous BID   melatonin  5 mg Oral QHS   metoprolol tartrate  12.5 mg Oral BID   multivitamin with minerals  1 tablet Oral Daily   OLANZapine  2.5 mg Oral QHS   polyethylene glycol  17 g Oral Daily   Rotigotine  1 patch Transdermal QHS   thiamine  100 mg Oral Daily   traMADol  75 mg Oral BID   Continuous Infusions:  meropenem (MERREM) IV 2 g (01/30/21 0617)   vancomycin 1,250 mg (01/29/21 2110)     LOS: 4 days    Time spent: 30 minutes.    Barb Merino, MD [facility] Pager [phone]

## 2021-01-30 NOTE — Progress Notes (Signed)
PT Cancellation Note  Patient Details Name: Jason Hartman MRN: [id number] DOB: [birth year]   Cancelled Treatment:    Reason Eval/Treat Not Completed: Other (comment).  Pt refused therapy stating he was eating (tray was on counter).  Asked PT to return at another time. Will follow up as time and pt allow.   Ramond Dial 01/30/2021, 1:43 PM  Mee Hives, PT PhD Acute Rehab Dept. Number: [place]

## 2021-01-31 ENCOUNTER — Inpatient Hospital Stay: Payer: Self-pay

## 2021-01-31 DIAGNOSIS — T8452XD Infection and inflammatory reaction due to internal left hip prosthesis, subsequent encounter: Secondary | ICD-10-CM | POA: Diagnosis not present

## 2021-01-31 DIAGNOSIS — N1831 Chronic kidney disease, stage 3a: Secondary | ICD-10-CM | POA: Diagnosis not present

## 2021-01-31 DIAGNOSIS — T8452XA Infection and inflammatory reaction due to internal left hip prosthesis, initial encounter: Secondary | ICD-10-CM | POA: Diagnosis not present

## 2021-01-31 DIAGNOSIS — E1142 Type 2 diabetes mellitus with diabetic polyneuropathy: Secondary | ICD-10-CM | POA: Diagnosis not present

## 2021-01-31 DIAGNOSIS — A499 Bacterial infection, unspecified: Secondary | ICD-10-CM | POA: Diagnosis not present

## 2021-01-31 LAB — CULTURE, BLOOD (ROUTINE X 2)
Culture: NO GROWTH
Culture: NO GROWTH

## 2021-01-31 LAB — GLUCOSE, CAPILLARY
Glucose-Capillary: 138 mg/dL — ABNORMAL HIGH (ref 70–99)
Glucose-Capillary: 150 mg/dL — ABNORMAL HIGH (ref 70–99)
Glucose-Capillary: 195 mg/dL — ABNORMAL HIGH (ref 70–99)
Glucose-Capillary: 161 mg/dL — ABNORMAL HIGH (ref 70–99)

## 2021-01-31 MED ORDER — POLYETHYLENE GLYCOL 3350 17 G PO PACK
17.0000 g | PACK | Freq: Two times a day (BID) | ORAL | Status: DC
  Administered 2021-01-31 – 2021-02-01 (×3): 17 g via ORAL
  Filled 2021-01-31 (×3): qty 1

## 2021-01-31 MED ORDER — APIXABAN 5 MG PO TABS: 5.0000 mg | ORAL_TABLET | Freq: Two times a day (BID) | ORAL | Status: DC

## 2021-01-31 MED ORDER — BISACODYL 10 MG RE SUPP
10.0000 mg | Freq: Once | RECTAL | Status: AC
  Administered 2021-01-31: 10 mg via RECTAL
  Filled 2021-01-31: qty 1

## 2021-01-31 MED ORDER — APIXABAN 5 MG PO TABS
5.0000 mg | ORAL_TABLET | Freq: Two times a day (BID) | ORAL | Status: DC
  Administered 2021-01-31 – 2021-02-01 (×2): 5 mg via ORAL
  Filled 2021-01-31 (×2): qty 1

## 2021-01-31 MED ORDER — SODIUM CHLORIDE 0.9 % IV SOLN
1.0000 g | Freq: Three times a day (TID) | INTRAVENOUS | Status: DC
  Administered 2021-01-31 – 2021-02-01 (×4): 1 g via INTRAVENOUS
  Filled 2021-01-31 (×6): qty 1

## 2021-01-31 NOTE — Progress Notes (Signed)
PROGRESS NOTE    Jason Hartman  ALP:[id number] DOB: March 11, 1936 DOA: 01/26/2021 PCP: [name], MD    Brief Narrative:  85 year old gentleman with history of recent left hip hemiarthroplasty on 9/26 after a fall, has history of Parkinson's, essential hypertension, CKD stage IIIa and paroxysmal A. fib along with previous history of ESBL who presented from skilled nursing rehab with ongoing left hip wound.  Patient was brought to orthopedics office with drainage from the site of the hip hemiarthroplasty that was done on 9/26.  He also suffered a fall at rehab.  He was taking oral antibiotics for presumed infection of the hip.  Patient was brought to the operating room and admitted to hospitalist service after undergoing debridement and washout on 10/21.   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 MRSA and ESBL Klebsiella on the local cultures. 10/21, debridement and wound VAC placement.  Followed by orthopedics and infectious disease.  Not a multistage surgical procedure candidate due to extensive underlying medical issues.  Probably long-term antibiotics treatment. Currently remains on vancomycin and meropenem, surgical wound cultures with MRSA and ESBL Klebsiella.  Followed by ID.  Anticipate prolonged IV antibiotics.  PICC line today. Pain relief and continue to work with PT OT to increase mobility and endurance.  Weightbearing as tolerated as per  orthopedics.  Essential hypertension: Blood pressure fairly stable.  Antihypertensives with holding parameters.  Paroxysmal A. fib: Rate controlled on amiodarone and metoprolol.  Eliquis was on hold.  Resumed today.  Do not anticipate further surgery.  Type 2 diabetes with peripheral neuropathy: Oral hypoglycemics on hold.  Blood sugars are fairly stable.  Continue Lantus and sliding scale insulin.  Will discharge on oral hypoglycemics.  Anxiety/panic attacks: Patient on Klonopin, melatonin at night, gabapentin and Zyprexa at night.  Continued.  Continue to work with PT OT.  Adequate pain management.  Bowel regimen. PICC line today.  COVID testing today. Anticipate discharge back to skilled nursing facility tomorrow with prolonged IV antibiotics.   DVT prophylaxis: SCDs Start: 01/26/21 1944 Place TED hose Start: 01/26/21 1944 apixaban (ELIQUIS) tablet 5 mg   Code Status: Full code Family Communication: None at bedside Disposition Plan: Status is: Inpatient  Remains inpatient appropriate because: Active surgical procedures.  IV antibiotics.         Consultants:  Infectious disease Orthopedics  Procedures:  Debridement left hip  Antimicrobials:  Vancomycin meropenem 10/21---   Subjective: Patient seen and examined.  straining to poop.  Just does not feel well.  No other active events.  Objective: Vitals:   01/30/21 1501 01/30/21 1942 01/31/21 0458 01/31/21 0756  BP: 140/75 (!) 141/67 (!) 130/58 (!) 111/53  Pulse: 94 (!) 110 89 88  Resp: 17 20 18 16   Temp: 98.3 F (36.8 C) 99.7 F (37.6 C) 98.6 F (37 C) 98.2 F (36.8 C)  TempSrc: Oral Oral Oral Oral  SpO2: 98% 98% 96% 97%  Weight:      Height:  Intake/Output Summary (Last 24 hours) at 01/31/2021 1322 Last data filed at 01/31/2021 0900 Gross per 24 hour  Intake 1044.51 ml  Output 1400 ml  Net -355.49 ml   Filed Weights   01/26/21 1226  Weight: 70.3 kg    Examination:  General exam: Frail.   Debilitated.  Chronically sick looking. Respiratory system: Clear to auscultation. Respiratory effort normal.  No added sounds. Cardiovascular system: S1 & S2 heard, RRR. No JVD, murmurs, rubs, gallops or clicks.  Gastrointestinal system: Abdomen is nondistended, soft and nontender. No organomegaly or masses felt. Normal bowel sounds heard. Central nervous system: Alert and oriented.  Generalized weakness. Extremities:  Left hip with open incision, fitted with wound VAC.  Minimal clear drainage on the canister.  Distal neurovascular status intact.    Data Reviewed: I have personally reviewed following labs and imaging studies  CBC: Recent Labs  Lab 01/27/21 0108 01/29/21 0123  WBC 11.3* 8.2  HGB 8.5* 8.5*  HCT 27.8* 27.9*  MCV 91.4 92.4  PLT 305 454   Basic Metabolic Panel: Recent Labs  Lab 01/26/21 2140 01/27/21 0108 01/29/21 0123  NA 129* 132* 137  K 5.1 5.7* 3.8  CL 98 100 102  CO2 21* 24 27  GLUCOSE 220* 215* 163*  BUN 20 22 15   CREATININE 1.07 0.99 1.03  CALCIUM 8.4* 8.5* 8.2*   GFR: Estimated Creatinine Clearance: 55 mL/min (by C-G formula based on SCr of 1.03 mg/dL). Liver Function Tests: No results for input(s): AST, ALT, ALKPHOS, BILITOT, PROT, ALBUMIN in the last 168 hours. No results for input(s): LIPASE, AMYLASE in the last 168 hours. No results for input(s): AMMONIA in the last 168 hours. Coagulation Profile: No results for input(s): INR, PROTIME in the last 168 hours. Cardiac Enzymes: No results for input(s): CKTOTAL, CKMB, CKMBINDEX, TROPONINI in the last 168 hours. BNP (last 3 results) No results for input(s): PROBNP in the last 8760 hours. HbA1C: No results for input(s): HGBA1C in the last 72 hours.  CBG: Recent Labs  Lab 01/30/21 1225 01/30/21 1740 01/30/21 2053 01/31/21 0755 01/31/21 1254  GLUCAP 129* 198* 190* 138* 150*   Lipid Profile: No results for input(s): CHOL, HDL, LDLCALC, TRIG, CHOLHDL, LDLDIRECT in the last 72  hours. Thyroid Function Tests: No results for input(s): TSH, T4TOTAL, FREET4, T3FREE, THYROIDAB in the last 72 hours. Anemia Panel: No results for input(s): VITAMINB12, FOLATE, FERRITIN, TIBC, IRON, RETICCTPCT in the last 72 hours. Sepsis Labs: No results for input(s): PROCALCITON, LATICACIDVEN in the last 168 hours.  Recent Results (from the past 240 hour(s))  Fungus Culture With Stain     Status: None (Preliminary result)   Collection Time: 01/26/21  5:04 PM   Specimen: PATH Other; Tissue  Result Value Ref Range Status   Fungus Stain Final report  Final    Comment: (NOTE) Performed At: [facility] [place], [place] [id number] Rush Farmer MD WG:[id number]    Fungus (Mycology) Culture PENDING  Incomplete   Fungal Source [facility] A  Final    Comment: Performed at [facility], [place] [address] [place], [place] [address]  Fungus Culture With Stain     Status: None (Preliminary result)   Collection Time: 01/26/21  5:04 PM   Specimen: PATH Other; Tissue  Result Value Ref Range Status   Fungus Stain Final report  Final    Comment: (NOTE) Performed At: [facility] [place], [place] [id number] Rush Farmer MD KG:[id number]    Fungus (Mycology)  Culture PENDING  Incomplete   Fungal Source SPEC B  Final    Comment: Performed at [facility], [place] [address], [place], [place] [address]  Aerobic/Anaerobic Culture w Gram Stain (surgical/deep wound)     Status: None (Preliminary result)   Collection Time: 01/26/21  5:04 PM   Specimen: PATH Other; Tissue  Result Value Ref Range Status   Specimen Description SYNOVIAL FLUID  Final   Special Requests SPEC A  Final   Gram Stain   Final    NO SQUAMOUS EPITHELIAL CELLS SEEN FEW WBC SEEN NO ORGANISMS SEEN Performed at [facility], [place] [address], [place], [place] [address]    Culture   Final    FEW METHICILLIN RESISTANT STAPHYLOCOCCUS AUREUS RARE KLEBSIELLA  PNEUMONIAE Confirmed Extended Spectrum Beta-Lactamase Producer (ESBL).  In bloodstream infections from ESBL organisms, carbapenems are preferred over piperacillin/tazobactam. They are shown to have a lower risk of mortality. NO ANAEROBES ISOLATED; CULTURE IN PROGRESS FOR 5 DAYS    Report Status PENDING  Incomplete   Organism ID, Bacteria KLEBSIELLA PNEUMONIAE  Final   Organism ID, Bacteria METHICILLIN RESISTANT STAPHYLOCOCCUS AUREUS  Final      Susceptibility   Klebsiella pneumoniae - MIC*    AMPICILLIN >=32 RESISTANT Resistant     CEFAZOLIN >=64 RESISTANT Resistant     CEFEPIME 2 SENSITIVE Sensitive     CEFTAZIDIME RESISTANT Resistant     CEFTRIAXONE >=64 RESISTANT Resistant     CIPROFLOXACIN 2 RESISTANT Resistant     GENTAMICIN >=16 RESISTANT Resistant     IMIPENEM <=0.25 SENSITIVE Sensitive     TRIMETH/SULFA >=320 RESISTANT Resistant     AMPICILLIN/SULBACTAM >=32 RESISTANT Resistant     PIP/TAZO 16 SENSITIVE Sensitive     * RARE KLEBSIELLA PNEUMONIAE   Methicillin resistant staphylococcus aureus - MIC*    CIPROFLOXACIN >=8 RESISTANT Resistant     ERYTHROMYCIN >=8 RESISTANT Resistant     GENTAMICIN <=0.5 SENSITIVE Sensitive     OXACILLIN >=4 RESISTANT Resistant     TETRACYCLINE <=1 SENSITIVE Sensitive     VANCOMYCIN 1 SENSITIVE Sensitive     TRIMETH/SULFA >=320 RESISTANT Resistant     CLINDAMYCIN <=0.25 SENSITIVE Sensitive     RIFAMPIN <=0.5 SENSITIVE Sensitive     Inducible Clindamycin NEGATIVE Sensitive     * FEW METHICILLIN RESISTANT STAPHYLOCOCCUS AUREUS  Aerobic/Anaerobic Culture w Gram Stain (surgical/deep wound)     Status: None (Preliminary result)   Collection Time: 01/26/21  5:04 PM   Specimen: PATH Other; Tissue  Result Value Ref Range Status   Specimen Description TISSUE  Final   Special Requests IT BAND 1 SPEC B  Final   Gram Stain   Final    NO SQUAMOUS EPITHELIAL CELLS SEEN MODERATE WBC SEEN NO ORGANISMS SEEN    Culture   Final    RARE STAPHYLOCOCCUS  AUREUS SUSCEPTIBILITIES TO FOLLOW Performed at [facility], [address], [place], [place] [address]    Report Status PENDING  Incomplete  Acid Fast Smear (AFB)     Status: None   Collection Time: 01/26/21  5:04 PM   Specimen: PATH Other; Tissue  Result Value Ref Range Status   AFB Specimen Processing Concentration  Final   Acid Fast Smear Negative  Final    Comment: (NOTE) Performed At: [facility] [place], [place] [id number] Rush Farmer MD [id number]:[id number]    Source (AFB) SYNOVIAL FLUID  Final    Comment: Performed at [facility]  Lab, [address] [place], [place] [address]  Acid Fast Smear (AFB)     Status: None   Collection Time: 01/26/21  5:04 PM   Specimen: PATH Other; Tissue  Result Value Ref Range Status   AFB Specimen Processing Comment  Final    Comment: Tissue Grinding and Digestion/Decontamination   Acid Fast Smear Negative  Final    Comment: (NOTE) Performed At: [facility] [place], [place] [id number] Rush Farmer MD MV:[id number]    Source (AFB) IT BAND 1  Final    Comment: Performed at [facility], [place] [address], [place], [place] [address]  Fungus Culture Result     Status: None   Collection Time: 01/26/21  5:04 PM  Result Value Ref Range Status   Result 1 Comment  Final    Comment: (NOTE) KOH/Calcofluor preparation:  no fungus observed. Performed At: [facility] [place], [place] [id number] Rush Farmer MD DG:[id number]   Fungus Culture Result     Status: None   Collection Time: 01/26/21  5:04 PM  Result Value Ref Range Status   Result 1 Comment  Final    Comment: (NOTE) KOH/Calcofluor preparation:  no fungus observed. Performed At: [facility] [place], [place] [id number] Rush Farmer MD [id number]   Fungus Culture With Stain     Status: None (Preliminary result)   Collection Time: 01/26/21  5:05 PM   Specimen: PATH Other;  Tissue  Result Value Ref Range Status   Fungus Stain Final report  Final    Comment: (NOTE) Performed At: [facility] [place], [place] [id number] Rush Farmer MD [id number]    Fungus (Mycology) Culture PENDING  Incomplete   Fungal Source [facility] C  Final    Comment: Performed at [facility], [place] [address], [place], [place] [address]  Aerobic/Anaerobic Culture w Gram Stain (surgical/deep wound)     Status: None (Preliminary result)   Collection Time: 01/26/21  5:05 PM   Specimen: PATH Other; Tissue  Result Value Ref Range Status   Specimen Description TISSUE  Final   Special Requests IT BAND 2 SPEC C  Final   Gram Stain   Final    FEW SQUAMOUS EPITHELIAL CELLS PRESENT MODERATE WBC SEEN NO ORGANISMS SEEN Performed at [facility], [place] [address], [place], [place] [address]    Culture   Final    RARE METHICILLIN RESISTANT STAPHYLOCOCCUS AUREUS RARE KLEBSIELLA PNEUMONIAE Confirmed Extended Spectrum Beta-Lactamase Producer (ESBL).  In bloodstream infections from ESBL organisms, carbapenems are preferred over piperacillin/tazobactam. They are shown to have a lower risk of mortality. NO ANAEROBES ISOLATED; CULTURE IN PROGRESS FOR 5 DAYS    Report Status PENDING  Incomplete   Organism ID, Bacteria METHICILLIN RESISTANT STAPHYLOCOCCUS AUREUS  Final   Organism ID, Bacteria KLEBSIELLA PNEUMONIAE  Final      Susceptibility   Klebsiella pneumoniae - MIC*    AMPICILLIN >=32 RESISTANT Resistant     CEFAZOLIN >=64 RESISTANT Resistant     CEFEPIME 2 SENSITIVE Sensitive     CEFTAZIDIME RESISTANT Resistant     CEFTRIAXONE >=64 RESISTANT Resistant     CIPROFLOXACIN 1 RESISTANT Resistant     GENTAMICIN >=16 RESISTANT Resistant     IMIPENEM <=0.25 SENSITIVE Sensitive     TRIMETH/SULFA >=320 RESISTANT Resistant     AMPICILLIN/SULBACTAM >=32 RESISTANT Resistant     PIP/TAZO 16 SENSITIVE Sensitive     * RARE KLEBSIELLA PNEUMONIAE  Methicillin resistant  staphylococcus aureus - MIC*    CIPROFLOXACIN >=8 RESISTANT Resistant     ERYTHROMYCIN >=8 RESISTANT Resistant     GENTAMICIN <=0.5 SENSITIVE Sensitive     OXACILLIN >=4 RESISTANT Resistant     TETRACYCLINE <=1 SENSITIVE Sensitive     VANCOMYCIN 1 SENSITIVE Sensitive     TRIMETH/SULFA >=320 RESISTANT Resistant     CLINDAMYCIN <=0.25 SENSITIVE Sensitive     RIFAMPIN <=0.5 SENSITIVE Sensitive     Inducible Clindamycin NEGATIVE Sensitive     * RARE METHICILLIN RESISTANT STAPHYLOCOCCUS AUREUS  Acid Fast Smear (AFB)     Status: None   Collection Time: 01/26/21  5:05 PM   Specimen: PATH Other; Tissue  Result Value Ref Range Status   AFB Specimen Processing Comment  Final    Comment: Tissue Grinding and Digestion/Decontamination   Acid Fast Smear Negative  Final    Comment: (NOTE) Performed At: [facility] [place] [address] [place], [place] [id number] Rush Farmer MD [id number]    Source (AFB) IT BAND 2  Final    Comment: Performed at [facility], [place] [address], [place], [place] [address]  Fungus Culture Result     Status: None   Collection Time: 01/26/21  5:05 PM  Result Value Ref Range Status   Result 1 Comment  Final    Comment: (NOTE) KOH/Calcofluor preparation:  no fungus observed. Performed At: [facility] [place], [place] [id number] Rush Farmer MD ER:[id number]   Culture, blood (routine x 2)     Status: None   Collection Time: 01/26/21  9:40 PM   Specimen: BLOOD RIGHT HAND  Result Value Ref Range Status   Specimen Description BLOOD RIGHT HAND  Final   Special Requests   Final    BOTTLES DRAWN AEROBIC ONLY Blood Culture results may not be optimal due to an inadequate volume of blood received in culture bottles   Culture   Final    NO GROWTH 5 DAYS Performed at [facility], [place] [address], [place], [place] [address]    Report Status 01/31/2021 FINAL  Final  Culture, blood (routine x 2)     Status: None   Collection  Time: 01/26/21  9:51 PM   Specimen: BLOOD LEFT ARM  Result Value Ref Range Status   Specimen Description BLOOD LEFT ARM  Final   Special Requests   Final    BOTTLES DRAWN AEROBIC ONLY Blood Culture results may not be optimal due to an inadequate volume of blood received in culture bottles   Culture   Final    NO GROWTH 5 DAYS Performed at [facility], [place] [address] [place],  [address]    Report Status 01/31/2021 FINAL  Final  Resp Panel by RT-PCR (Flu A&B, Covid) Nasopharyngeal Swab     Status: None   Collection Time: 01/29/21 10:43 AM   Specimen: Nasopharyngeal Swab; Nasopharyngeal(NP) swabs in vial transport medium  Result Value Ref Range Status   SARS Coronavirus 2 by RT PCR NEGATIVE NEGATIVE Final    Comment: (NOTE) SARS-CoV-2 target nucleic acids are NOT DETECTED.  The SARS-CoV-2 RNA is generally detectable in upper respiratory specimens during the acute phase of infection. The lowest concentration of SARS-CoV-2 viral copies this assay can detect is 138 copies/mL. A negative result does not preclude SARS-Cov-2 infection and should not be used as the sole basis for treatment or other patient management decisions. A negative result may occur with  improper specimen  collection/handling, submission of specimen other than nasopharyngeal swab, presence of viral mutation(s) within the areas targeted by this assay, and inadequate number of viral copies(<138 copies/mL). A negative result must be combined with clinical observations, patient history, and epidemiological information. The expected result is Negative.  Fact Sheet for Patients:  EntrepreneurPulse.com.au  Fact Sheet for Healthcare Providers:  IncredibleEmployment.be  This test is no t yet approved or cleared by the [place] FDA and  has been authorized for detection and/or diagnosis of SARS-CoV-2 by FDA under an Emergency Use Authorization (EUA). This EUA will  remain  in effect (meaning this test can be used) for the duration of the COVID-19 declaration under Section 564(b)(1) of the Act, 21 U.S.C.section 360bbb-3(b)(1), unless the authorization is terminated  or revoked sooner.       Influenza A by PCR NEGATIVE NEGATIVE Final   Influenza B by PCR NEGATIVE NEGATIVE Final    Comment: (NOTE) The Xpert Xpress SARS-CoV-2/FLU/RSV plus assay is intended as an aid in the diagnosis of influenza from Nasopharyngeal swab specimens and should not be used as a sole basis for treatment. Nasal washings and aspirates are unacceptable for Xpert Xpress SARS-CoV-2/FLU/RSV testing.  Fact Sheet for Patients: EntrepreneurPulse.com.au  Fact Sheet for Healthcare Providers: IncredibleEmployment.be  This test is not yet approved or cleared by the [place] FDA and has been authorized for detection and/or diagnosis of SARS-CoV-2 by FDA under an Emergency Use Authorization (EUA). This EUA will remain in effect (meaning this test can be used) for the duration of the COVID-19 declaration under Section 564(b)(1) of the Act, 21 U.S.C. section 360bbb-3(b)(1), unless the authorization is terminated or revoked.  Performed at [facility], [place] [address], [place],  [address]          Radiology Studies: [place] EKG SITE RITE  Result Date: 01/31/2021 If Site Rite image not attached, placement could not be confirmed due to current cardiac rhythm.       Scheduled Meds:  amiodarone  200 mg Oral Daily   apixaban  5 mg Oral BID   clonazePAM  0.25 mg Oral Daily   clonazePAM  0.5 mg Oral QHS   docusate sodium  100 mg Oral BID   famotidine  20 mg Oral Daily   feeding supplement  237 mL Oral TID BM   ferrous sulfate  325 mg Oral Q breakfast   gabapentin  300 mg Oral QHS   insulin aspart  0-5 Units Subcutaneous QHS   insulin aspart  0-9 Units Subcutaneous TID WC   insulin glargine-yfgn  4 Units Subcutaneous BID    melatonin  5 mg Oral QHS   metoprolol tartrate  12.5 mg Oral BID   multivitamin with minerals  1 tablet Oral Daily   OLANZapine  2.5 mg Oral QHS   polyethylene glycol  17 g Oral BID   Rotigotine  1 patch Transdermal QHS   thiamine  100 mg Oral Daily   traMADol  75 mg Oral BID   Continuous Infusions:  meropenem (MERREM) IV     vancomycin 1,250 mg (01/30/21 2112)     LOS: 5 days    Time spent: 30 minutes.    Barb Merino, MD [facility] Pager [phone]

## 2021-01-31 NOTE — Progress Notes (Addendum)
      INFECTIOUS DISEASE ATTENDING ADDENDUM:   Date: 01/31/2021  Patient name: Jason Hartman  Medical record number: [id number]  Date of birth: May 12, 1935   Diagnosis: Prosthetic hip infection  Culture Result: ESBL Klebsiella pneumonia and MRSA  Allergies  Allergen Reactions   Oxycodone Anaphylaxis   Iodinated Diagnostic Agents Hives    alleric to renografin,isovue & omnipaque, hives, requires 13 hr prep//a.calhoun, Onset Date: [id number]      Iodine Hives and Rash    alleric to renografin,isovue & omnipaque, hives, requires 13 hr prep//a.calhoun, Onset Date: [id number] allergic to renografin,isovue & omnipaque, hives, requires 13 hr prep//a.calhoun, Onset Date: [id number]   Linezolid Hives and Rash        Methadone Hcl Other (See Comments)    hallucinations    Promethazine Other (See Comments)    Delirium (pulled out IV), erratic behavior Mental status change     Isovue [Iopamidol] Hives   Methadone    Morphine Sulfate Other (See Comments)    Unknown reaction   Omnipaque [Iohexol] Hives    Code: HIVES, Desc: alleric to renografin,isovue & omnipaque, hives, requires 13 hr prep//a.calhoun, Onset Date: [id number]    Quetiapine Other (See Comments)    Unknown reaction   Renografin [Diatrizoate] Hives   Statins Other (See Comments)    Muscle weakness   Zolpidem Other (See Comments)    Erratic behavior/delusions Altered mental status   Atorvastatin Itching and Other (See Comments)    Tired, weakness    Carbidopa-Levodopa Anxiety   Chlorhexidine Gluconate [Chlorhexidine] Hives and Rash   Clindamycin Rash   Colesevelam Other (See Comments)    tired    Doxazosin Rash   Lovastatin Other (See Comments)    Tired, nervousness    Rosuvastatin Other (See Comments)    Tired, weakness     OPAT Orders Discharge antibiotics to be given via PICC line Discharge antibiotics: Ertapenem 1 g daily along with vancomycin dosed per pharmacy  Aim for Vancomycin trough 15-20  or AUC 400-550 (unless otherwise indicated) Duration: 8 weeks  End Date:  December 16th, 2022  [facility] Per Protocol:  Home health RN for IV administration and teaching; PICC line care and labs.     Labs biweekly on IV antibiotics:  --X_ BMP w GFR _X _Vancomycin troughs  Labs weekly while on IV antibiotics: _x_ CBC with differential  _x_ CRP _x_ ESR   __ Please pull PIC at completion of IV antibiotics x__ Please leave PIC in place until doctor has seen patient or been notified  Fax weekly labs to [phone]  Clinic Follow Up Appt:   Jason Hartman has an appointment on 02/19/2021 at 230PM with Dr. Juleen China at the:  [facility] for Infectious Disease is located in the [facility] at  [address] in [place].  [address], which is located to the left of the elevators.  Phone: [phone]  Fax: [phone]  [email or web address]   He should arrive 15-30 minutes prior to his appointment.  We will follow-up on the remaining Klebsiella pneumonia and Staphylococcus aureus sensitivities that have not been finalized but I expect that they will be the same organism as we have seen already.  Otherwise we will sign off please call with further questions.   Alcide Evener 01/31/2021, 4:59 PM

## 2021-01-31 NOTE — Plan of Care (Signed)

## 2021-01-31 NOTE — Plan of Care (Signed)
  Problem: Skin Integrity: Goal: Demonstration of wound healing without infection will improve Outcome: Progressing   Problem: Education: Goal: Knowledge of General Education information will improve Description: Including pain rating scale, medication(s)/side effects and non-pharmacologic comfort measures Outcome: Progressing   Problem: Health Behavior/Discharge Planning: Goal: Ability to manage health-related needs will improve Outcome: Progressing   Problem: Activity: Goal: Risk for activity intolerance will decrease Outcome: Progressing   Problem: Nutrition: Goal: Adequate nutrition will be maintained Outcome: Progressing   Problem: Elimination: Goal: Will not experience complications related to bowel motility Outcome: Progressing Goal: Will not experience complications related to urinary retention Outcome: Progressing   Problem: Pain Managment: Goal: General experience of comfort will improve Outcome: Progressing   Problem: Safety: Goal: Ability to remain free from injury will improve Outcome: Progressing

## 2021-01-31 NOTE — Progress Notes (Signed)
Physical Therapy Treatment Patient Details Name: Jason Hartman MRN: [id number] DOB: 24-Jun-1935 Today's Date: 01/31/2021   History of Present Illness Pt. is 85 yr old M s/p L ant/lat hip hemiarthroplasty admitted on 10/21 with inc drainage from recent L hip incision. Pt. was recovering well at rehab when he experienced a fall OOB and has since been having inc drainage. Underwent I + D of L hip on 10/21 with placement of wound vac. PMH: CAD, cellulitis, HTN, OSA, DM, neuropathy, radiculopathy, PD    PT Comments    Pt continues to require max encouragement to participate minimally in therapy.  PT initiates gentle therex in bed and pt demos fair tolerance to slight ROM with inc muscle guarding and dec participation noted.  Pt is reeducated on the importance of movement.  Does not demo understanding.  PT to continue to follow up to encourage pt to trial EOB.  Recommendations for follow up therapy are one component of a multi-disciplinary discharge planning process, led by the attending physician.  Recommendations may be updated based on patient status, additional functional criteria and insurance authorization.  Follow Up Recommendations  Skilled nursing-short term rehab (<3 hours/day)     Assistance Recommended at Discharge Frequent or constant Supervision/Assistance  Equipment Recommendations  Wheelchair (measurements PT);Rolling walker (2 wheels)    Recommendations for Other Services       Precautions / Restrictions Precautions Precautions: Anterior Hip Restrictions LLE Weight Bearing: Weight bearing as tolerated     Mobility  Bed Mobility                    Transfers                        Ambulation/Gait                 Stairs             Wheelchair Mobility    Modified Rankin (Stroke Patients Only)       Balance                                            Cognition Arousal/Alertness: Awake/alert Behavior During  Therapy: Agitated;Anxious Overall Cognitive Status: Difficult to assess                                 General Comments: Pt. is supine in bed when PT arrives.  Yelling out for help.  Wants pain meds, NSG notified.  Reluctant to work with PT.  States he made an agreement to work with the "therapy guy at 69".  PT informs pt. that that is the OT, and PT would like to assist pt. in beginning to move the LEs.  With inc encouragement, pt. is agreeable to try some movement in bed.        Exercises General Exercises - Lower Extremity Heel Slides: AAROM;Left;10 reps Hip ABduction/ADduction:  (PT moves pt. leg towards midline and then to neutral, very small movements.) Straight Leg Raises: AAROM;Left;10 reps    General Comments        Pertinent Vitals/Pain Pain Assessment: Faces Faces Pain Scale: Hurts worst Pain Location: L hip Pain Descriptors / Indicators: Tender;Throbbing;Aching;Guarding;Discomfort;Grimacing;Constant Pain Intervention(s): Limited activity within patient's tolerance;Monitored during session;Patient requesting pain meds-RN notified    Home  Living                          Prior Function            PT Goals (current goals can now be found in the care plan section) Progress towards PT goals: Not progressing toward goals - comment (Pt. remains limited by pain, refuses to try most activities.)    Frequency           PT Plan Current plan remains appropriate    Co-evaluation              AM-PAC PT "6 Clicks" Mobility   Outcome Measure  Help needed turning from your back to your side while in a flat bed without using bedrails?: A Lot Help needed moving from lying on your back to sitting on the side of a flat bed without using bedrails?: A Lot Help needed moving to and from a bed to a chair (including a wheelchair)?: A Lot Help needed standing up from a chair using your arms (e.g., wheelchair or bedside chair)?: A Lot Help needed to  walk in hospital room?: Total Help needed climbing 3-5 steps with a railing? : Total 6 Click Score: 10    End of Session   Activity Tolerance: Patient limited by pain Patient left: in bed;with bed alarm set;with call bell/phone within reach         Time: 0955-1006 PT Time Calculation (min) (ACUTE ONLY): 11 min  Charges:  $Therapeutic Exercise: 8-22 mins                     Aaren Atallah A. Murriel Eidem, PT, DPT Acute Rehabilitation Services Office: [place] 01/31/2021, 10:14 AM

## 2021-01-31 NOTE — Progress Notes (Signed)
[facility] for Infectious Disease  Date of Admission:  01/26/2021     Total days of antibiotics 6         ASSESSMENT:  Mr. 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 Not a surgical candidate to replace the hip. Awaiting Staphylococcus aureus sensitivities for final antibiotic recommendations. Will need at least 8 weeks of IV antibiotics and oral antibiotic regimen will be a challenge given multi-drug resistant Klebsiella. If cultures positive for MRSA will keep vancomycin and ertapenem and if cultures positive for MSSA will do ertapenem only. Will need PICC line placed prior to discharge. Most recent PT note with disposition to skilled nursing. I updated his wife Curt Bears via phone per his request. Remaining medical and supportive care per primary team.   PLAN:  Continue vancomycin and ertapenem.  Await Staphylococcus aureus sensitivities.  PICC line prior to discharge Will need 8 weeks of antibiotics Remaining medical and supportive care per primary team.   Principal Problem:   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[place]) Active Problems:   Essential hypertension   Parkinsonism (HCC)   Panic disorder without agoraphobia   GERD (gastroesophageal reflux disease)   Diabetic peripheral neuropathy (HCC)   Chronic kidney disease, stage 3a (HCC)   Slow transit constipation   PAF (paroxysmal atrial fibrillation) (HCC)   ESBL (extended spectrum beta-lactamase) producing bacteria infection   Staphylococcus aureus infection    amiodarone  200 mg Oral Daily   clonazePAM  0.25 mg Oral Daily   clonazePAM  0.5 mg Oral QHS   docusate sodium  100 mg Oral BID   famotidine  20 mg Oral Daily   feeding supplement  237 mL Oral TID BM   ferrous sulfate  325 mg Oral Q breakfast   gabapentin  300 mg Oral QHS   heparin  5,000 Units Subcutaneous Q8H   insulin aspart  0-5 Units Subcutaneous QHS    insulin aspart  0-9 Units Subcutaneous TID WC   insulin glargine-yfgn  4 Units Subcutaneous BID   melatonin  5 mg Oral QHS   metoprolol tartrate  12.5 mg Oral BID   multivitamin with minerals  1 tablet Oral Daily   OLANZapine  2.5 mg Oral QHS   polyethylene glycol  17 g Oral BID   Rotigotine  1 patch Transdermal QHS   thiamine  100 mg Oral Daily   traMADol  75 mg Oral BID    SUBJECTIVE:  Afebrile overnight. Confused and waiting on 11am appointment with Physical Therapy.   Allergies  Allergen Reactions   Oxycodone Anaphylaxis   Iodinated Diagnostic Agents Hives    alleric to renografin,isovue & omnipaque, hives, requires 13 hr prep//a.calhoun, Onset Date: [id number]      Iodine Hives and Rash    alleric to renografin,isovue & omnipaque, hives, requires 13 hr prep//a.[email or web address], Onset Date: [id number] allergic to renografin,isovue & omnipaque, hives, requires 13 hr prep//a.calhoun, Onset Date: [id number]   Linezolid Hives and Rash        Methadone Hcl Other (See Comments)    hallucinations    Promethazine Other (See Comments)    Delirium (pulled out IV), erratic behavior Mental status change     Isovue [Iopamidol] Hives   Methadone    Morphine Sulfate Other (See Comments)    Unknown reaction   Omnipaque [Iohexol] Hives    Code: HIVES, Desc: alleric to renografin,isovue & omnipaque, hives, requires 13 hr prep//a.calhoun, Onset  Date: [id number]    Quetiapine Other (See Comments)    Unknown reaction   Renografin [Diatrizoate] Hives   Statins Other (See Comments)    Muscle weakness   Zolpidem Other (See Comments)    Erratic behavior/delusions Altered mental status   Atorvastatin Itching and Other (See Comments)    Tired, weakness    Carbidopa-Levodopa Anxiety   Chlorhexidine Gluconate [Chlorhexidine] Hives and Rash   Clindamycin Rash   Colesevelam Other (See Comments)    tired    Doxazosin Rash   Lovastatin Other (See Comments)    Tired, nervousness    Rosuvastatin  Other (See Comments)    Tired, weakness      Review of Systems: Review of Systems  Constitutional:  Negative for chills, fever and weight loss.  Respiratory:  Negative for cough, shortness of breath and wheezing.   Cardiovascular:  Negative for chest pain and leg swelling.  Gastrointestinal:  Negative for abdominal pain, constipation, diarrhea, nausea and vomiting.  Skin:  Negative for rash.     OBJECTIVE: Vitals:   01/30/21 1501 01/30/21 1942 01/31/21 0458 01/31/21 0756  BP: 140/75 (!) 141/67 (!) 130/58 (!) 111/53  Pulse: 94 (!) 110 89 88  Resp: 17 20 18 16   Temp: 98.3 F (36.8 C) 99.7 F (37.6 C) 98.6 F (37 C) 98.2 F (36.8 C)  TempSrc: Oral Oral Oral Oral  SpO2: 98% 98% 96% 97%  Weight:      Height:       Body mass index is 22.24 kg/m.  Physical Exam Constitutional:      General: He is not in acute distress.    Appearance: He is well-developed.     Comments: Lying in bed with head of bed elevated; confused.   Cardiovascular:     Rate and Rhythm: Normal rate and regular rhythm.     Heart sounds: Normal heart sounds.  Pulmonary:     Effort: Pulmonary effort is normal.     Breath sounds: Normal breath sounds.  Musculoskeletal:     Comments: Wound VAC in place  Skin:    General: Skin is warm and dry.  Neurological:     Mental Status: He is alert.    Lab Results Lab Results  Component Value Date   WBC 8.2 01/29/2021   HGB 8.5 (L) 01/29/2021   HCT 27.9 (L) 01/29/2021   MCV 92.4 01/29/2021   PLT 260 01/29/2021    Lab Results  Component Value Date   CREATININE 1.03 01/29/2021   BUN 15 01/29/2021   NA 137 01/29/2021   K 3.8 01/29/2021   CL 102 01/29/2021   CO2 27 01/29/2021    Lab Results  Component Value Date   ALT 18 01/03/2021   AST 28 01/03/2021   ALKPHOS 88 01/03/2021   BILITOT 0.9 01/03/2021     Microbiology: Recent Results (from the past 240 hour(s))  Aerobic/Anaerobic Culture w Gram Stain (surgical/deep wound)     Status: None  (Preliminary result)   Collection Time: 01/26/21  5:04 PM   Specimen: PATH Other; Tissue  Result Value Ref Range Status   Specimen Description SYNOVIAL FLUID  Final   Special Requests SPEC A  Final   Gram Stain   Final    NO SQUAMOUS EPITHELIAL CELLS SEEN FEW WBC SEEN NO ORGANISMS SEEN    Culture   Final    FEW STAPHYLOCOCCUS AUREUS RARE KLEBSIELLA PNEUMONIAE Confirmed Extended Spectrum Beta-Lactamase Producer (ESBL).  In bloodstream infections from ESBL organisms, carbapenems are preferred  over piperacillin/tazobactam. They are shown to have a lower risk of mortality. HOLDING FOR POSSIBLE ANAEROBE Performed at [facility], [place] [address] [place], [place] [address]    Report Status PENDING  Incomplete   Organism ID, Bacteria KLEBSIELLA PNEUMONIAE  Final      Susceptibility   Klebsiella pneumoniae - MIC*    AMPICILLIN >=32 RESISTANT Resistant     CEFAZOLIN >=64 RESISTANT Resistant     CEFEPIME 2 SENSITIVE Sensitive     CEFTAZIDIME RESISTANT Resistant     CEFTRIAXONE >=64 RESISTANT Resistant     CIPROFLOXACIN 2 RESISTANT Resistant     GENTAMICIN >=16 RESISTANT Resistant     IMIPENEM <=0.25 SENSITIVE Sensitive     TRIMETH/SULFA >=320 RESISTANT Resistant     AMPICILLIN/SULBACTAM >=32 RESISTANT Resistant     PIP/TAZO 16 SENSITIVE Sensitive     * RARE KLEBSIELLA PNEUMONIAE  Aerobic/Anaerobic Culture w Gram Stain (surgical/deep wound)     Status: None (Preliminary result)   Collection Time: 01/26/21  5:04 PM   Specimen: PATH Other; Tissue  Result Value Ref Range Status   Specimen Description TISSUE  Final   Special Requests IT BAND 1 SPEC B  Final   Gram Stain   Final    NO SQUAMOUS EPITHELIAL CELLS SEEN MODERATE WBC SEEN NO ORGANISMS SEEN    Culture   Final    RARE STAPHYLOCOCCUS AUREUS HOLDING FOR POSSIBLE ANAEROBE Performed at [facility], [address], [place], [place] [address]    Report Status PENDING  Incomplete  Acid Fast Smear (AFB)     Status:  None   Collection Time: 01/26/21  5:04 PM   Specimen: PATH Other; Tissue  Result Value Ref Range Status   AFB Specimen Processing Concentration  Final   Acid Fast Smear Negative  Final    Comment: (NOTE) Performed At: [facility] [place], [place] [id number] Rush Farmer MD YJ:[id number]    Source (AFB) SYNOVIAL FLUID  Final    Comment: Performed at [facility], [place] [address] [place], [place] [address]  Acid Fast Smear (AFB)     Status: None   Collection Time: 01/26/21  5:04 PM   Specimen: PATH Other; Tissue  Result Value Ref Range Status   AFB Specimen Processing Comment  Final    Comment: Tissue Grinding and Digestion/Decontamination   Acid Fast Smear Negative  Final    Comment: (NOTE) Performed At: [facility] [place], [place] [id number] Rush Farmer MD OI:[id number]    Source (AFB) IT BAND 1  Final    Comment: Performed at [facility], [place] [address] [place], [place] [address]  Aerobic/Anaerobic Culture w Gram Stain (surgical/deep wound)     Status: None (Preliminary result)   Collection Time: 01/26/21  5:05 PM   Specimen: PATH Other; Tissue  Result Value Ref Range Status   Specimen Description TISSUE  Final   Special Requests IT BAND 2 SPEC C  Final   Gram Stain   Final    FEW SQUAMOUS EPITHELIAL CELLS PRESENT MODERATE WBC SEEN NO ORGANISMS SEEN Performed at [facility], [place] [address], [place], [place] [address]    Culture   Final    RARE STAPHYLOCOCCUS AUREUS RARE KLEBSIELLA PNEUMONIAE SUSCEPTIBILITIES TO FOLLOW NO ANAEROBES ISOLATED; CULTURE IN PROGRESS FOR 5 DAYS    Report Status PENDING  Incomplete  Acid Fast Smear (AFB)     Status: None   Collection Time:  01/26/21  5:05 PM   Specimen: PATH Other; Tissue  Result Value Ref Range Status   AFB Specimen Processing Comment  Final    Comment: Tissue Grinding and Digestion/Decontamination   Acid Fast Smear Negative  Final    Comment:  (NOTE) Performed At: [facility], [place] [id number] Rush Farmer MD KT:[id number]    Source (AFB) IT BAND 2  Final    Comment: Performed at [facility], [place] [address], [place], [place] [address]  Culture, blood (routine x 2)     Status: None   Collection Time: 01/26/21  9:40 PM   Specimen: BLOOD RIGHT HAND  Result Value Ref Range Status   Specimen Description BLOOD RIGHT HAND  Final   Special Requests   Final    BOTTLES DRAWN AEROBIC ONLY Blood Culture results may not be optimal due to an inadequate volume of blood received in culture bottles   Culture   Final    NO GROWTH 5 DAYS Performed at [facility], [place] [address] [place], [place] [address]    Report Status 01/31/2021 FINAL  Final  Culture, blood (routine x 2)     Status: None   Collection Time: 01/26/21  9:51 PM   Specimen: BLOOD LEFT ARM  Result Value Ref Range Status   Specimen Description BLOOD LEFT ARM  Final   Special Requests   Final    BOTTLES DRAWN AEROBIC ONLY Blood Culture results may not be optimal due to an inadequate volume of blood received in culture bottles   Culture   Final    NO GROWTH 5 DAYS Performed at [facility], [place] [address] [place],  [address]    Report Status 01/31/2021 FINAL  Final  Resp Panel by RT-PCR (Flu A&B, Covid) Nasopharyngeal Swab     Status: None   Collection Time: 01/29/21 10:43 AM   Specimen: Nasopharyngeal Swab; Nasopharyngeal(NP) swabs in vial transport medium  Result Value Ref Range Status   SARS Coronavirus 2 by RT PCR NEGATIVE NEGATIVE Final    Comment: (NOTE) SARS-CoV-2 target nucleic acids are NOT DETECTED.  The SARS-CoV-2 RNA is generally detectable in upper respiratory specimens during the acute phase of infection. The lowest concentration of SARS-CoV-2 viral copies this assay can detect is 138 copies/mL. A negative result does not preclude SARS-Cov-2 infection and should not be used as the sole basis  for treatment or other patient management decisions. A negative result may occur with  improper specimen collection/handling, submission of specimen other than nasopharyngeal swab, presence of viral mutation(s) within the areas targeted by this assay, and inadequate number of viral copies(<138 copies/mL). A negative result must be combined with clinical observations, patient history, and epidemiological information. The expected result is Negative.  Fact Sheet for Patients:  EntrepreneurPulse.com.au  Fact Sheet for Healthcare Providers:  IncredibleEmployment.be  This test is no t yet approved or cleared by the [place] FDA and  has been authorized for detection and/or diagnosis of SARS-CoV-2 by FDA under an Emergency Use Authorization (EUA). This EUA will remain  in effect (meaning this test can be used) for the duration of the COVID-19 declaration under Section 564(b)(1) of the Act, 21 U.S.C.section 360bbb-3(b)(1), unless the authorization is terminated  or revoked sooner.       Influenza A by PCR NEGATIVE NEGATIVE Final   Influenza B by PCR NEGATIVE NEGATIVE Final    Comment: (NOTE) The Xpert Xpress SARS-CoV-2/FLU/RSV plus assay is intended as  an aid in the diagnosis of influenza from Nasopharyngeal swab specimens and should not be used as a sole basis for treatment. Nasal washings and aspirates are unacceptable for Xpert Xpress SARS-CoV-2/FLU/RSV testing.  Fact Sheet for Patients: EntrepreneurPulse.com.au  Fact Sheet for Healthcare Providers: IncredibleEmployment.be  This test is not yet approved or cleared by the [place] FDA and has been authorized for detection and/or diagnosis of SARS-CoV-2 by FDA under an Emergency Use Authorization (EUA). This EUA will remain in effect (meaning this test can be used) for the duration of the COVID-19 declaration under Section 564(b)(1) of the Act, 21  U.S.C. section 360bbb-3(b)(1), unless the authorization is terminated or revoked.  Performed at [facility], [place] [address], [place], [place] [address]      Terri Piedra, [facility]  01/31/2021  11:13 AM

## 2021-01-31 NOTE — Progress Notes (Signed)
Occupational Therapy Treatment Patient Details Name: Jason Hartman MRN: [id number] DOB: 1935/06/10 Today's Date: 01/31/2021   History of present illness Pt. is 85 yr old M s/p L ant/lat hip hemiarthroplasty admitted on 10/21 with inc drainage from recent L hip incision. Pt. was recovering well at rehab when he experienced a fall OOB and has since been having inc drainage. Underwent I + D of L hip on 10/21 with placement of wound vac. PMH: CAD, cellulitis, HTN, OSA, DM, neuropathy, radiculopathy, PD   OT comments  Pt with limited ability/willingness to participate in OT session due to pain, despite being premedicated. Pt initially requesting to defer mobility, however agree after encouragement. Pt requiring Max A with bed mobility and Min guard for limited grooming at EOB. Continues to be appropriate for SNF at d/c. Will continue to follow acutely.   Recommendations for follow up therapy are one component of a multi-disciplinary discharge planning process, led by the attending physician.  Recommendations may be updated based on patient status, additional functional criteria and insurance authorization.    Follow Up Recommendations  Skilled nursing-short term rehab (<3 hours/day)    Assistance Recommended at Discharge    Equipment Recommendations  Other (comment) (TBD at next venue of care)    Recommendations for Other Services      Precautions / Restrictions Precautions Precautions: Anterior Hip Precaution Booklet Issued: Yes (comment) Precaution Comments: Reviewed precautions with patient, demos partial understanding Restrictions Weight Bearing Restrictions: Yes LLE Weight Bearing: Weight bearing as tolerated       Mobility Bed Mobility Overal bed mobility: Needs Assistance Bed Mobility: Supine to Sit;Sit to Supine     Supine to sit: Max assist Sit to supine: Max assist   General bed mobility comments: Assist with managing LLE and raising trunk.    Transfers                          Balance Overall balance assessment: Needs assistance Sitting-balance support: Single extremity supported;Feet supported Sitting balance-Leahy Scale: Poor                                     ADL either performed or assessed with clinical judgement   ADL Overall ADL's : Needs assistance/impaired     Grooming: Wash/dry face;Min guard;Sitting Grooming Details (indicate cue type and reason): At EOB.                               General ADL Comments: Pt is limited by pain, balance, activity tolerance, and weakness. Planned more grooming activity at EOB, however pt declining. Pt with significant R-sided lean in sitting to offload wieght from L hip due to pain, requiring external support for balance. Pt calling out in pain for duration of mobility/activity, requesting to lie back down.     Vision       Perception     Praxis      Cognition Arousal/Alertness: Awake/alert Behavior During Therapy: Agitated;Anxious Overall Cognitive Status: Difficult to assess                                            Exercises     Shoulder Instructions       General  Comments      Pertinent Vitals/ Pain       Pain Assessment: 0-10 Pain Score: 10-Worst pain ever Pain Location: L hip Pain Descriptors / Indicators: Tender;Throbbing;Aching;Guarding;Discomfort;Grimacing;Constant Pain Intervention(s): Limited activity within patient's tolerance;Monitored during session;Premedicated before session;Repositioned  Home Living                                          Prior Functioning/Environment              Frequency  Min 2X/week        Progress Toward Goals  OT Goals(current goals can now be found in the care plan section)  Progress towards OT goals: Not progressing toward goals - comment  Acute Rehab OT Goals OT Goal Formulation: With patient Time For Goal Achievement: 02/10/21 Potential to  Achieve Goals: Fair ADL Goals Pt Will Perform Grooming: with modified independence;standing Pt Will Perform Lower Body Bathing: with supervision Pt Will Perform Upper Body Dressing: with supervision Pt Will Perform Lower Body Dressing: with min guard assist;with adaptive equipment Pt Will Transfer to Toilet: with min guard assist;bedside commode;stand pivot transfer  Plan      Co-evaluation                 AM-PAC OT "6 Clicks" Daily Activity     Outcome Measure   Help from another person eating meals?: None Help from another person taking care of personal grooming?: A Little Help from another person toileting, which includes using toliet, bedpan, or urinal?: A Lot Help from another person bathing (including washing, rinsing, drying)?: A Lot Help from another person to put on and taking off regular upper body clothing?: A Lot Help from another person to put on and taking off regular lower body clothing?: A Lot 6 Click Score: 15    End of Session    OT Visit Diagnosis: Unsteadiness on feet (R26.81);Other abnormalities of gait and mobility (R26.89);History of falling (Z91.81);Muscle weakness (generalized) (M62.81);Pain   Activity Tolerance Patient limited by pain   Patient Left in bed;with call bell/phone within reach;with bed alarm set   Nurse Communication Mobility status        Time: 9379-0240 OT Time Calculation (min): 19 min  Charges: OT General Charges $OT Visit: 1 Visit OT Treatments $Self Care/Home Management : 8-22 mins  Nicholas Trompeter C, OT/L  Acute Rehab [place] 01/31/2021, 5:19 PM

## 2021-02-01 ENCOUNTER — Other Ambulatory Visit (HOSPITAL_COMMUNITY): Payer: Self-pay

## 2021-02-01 DIAGNOSIS — N1831 Chronic kidney disease, stage 3a: Secondary | ICD-10-CM | POA: Diagnosis not present

## 2021-02-01 DIAGNOSIS — A4901 Methicillin susceptible Staphylococcus aureus infection, unspecified site: Secondary | ICD-10-CM | POA: Diagnosis not present

## 2021-02-01 DIAGNOSIS — T8452XD Infection and inflammatory reaction due to internal left hip prosthesis, subsequent encounter: Secondary | ICD-10-CM | POA: Diagnosis not present

## 2021-02-01 DIAGNOSIS — A499 Bacterial infection, unspecified: Secondary | ICD-10-CM | POA: Diagnosis not present

## 2021-02-01 DIAGNOSIS — E1142 Type 2 diabetes mellitus with diabetic polyneuropathy: Secondary | ICD-10-CM | POA: Diagnosis not present

## 2021-02-01 DIAGNOSIS — Z1612 Extended spectrum beta lactamase (ESBL) resistance: Secondary | ICD-10-CM | POA: Diagnosis not present

## 2021-02-01 DIAGNOSIS — A4902 Methicillin resistant Staphylococcus aureus infection, unspecified site: Secondary | ICD-10-CM

## 2021-02-01 LAB — AEROBIC/ANAEROBIC CULTURE W GRAM STAIN (SURGICAL/DEEP WOUND)
Gram Stain: NONE SEEN
Gram Stain: NONE SEEN

## 2021-02-01 LAB — GLUCOSE, CAPILLARY
Glucose-Capillary: 119 mg/dL — ABNORMAL HIGH (ref 70–99)
Glucose-Capillary: 200 mg/dL — ABNORMAL HIGH (ref 70–99)
Glucose-Capillary: 173 mg/dL — ABNORMAL HIGH (ref 70–99)
Glucose-Capillary: 173 mg/dL — ABNORMAL HIGH (ref 70–99)

## 2021-02-01 LAB — SARS CORONAVIRUS 2 (TAT 6-24 HRS): SARS Coronavirus 2: NEGATIVE

## 2021-02-01 MED ORDER — CLONAZEPAM 0.25 MG PO TBDP: 0.2500 mg | ORAL_TABLET | Freq: Every morning | ORAL | 0 refills | Status: AC

## 2021-02-01 MED ORDER — CLONAZEPAM 0.5 MG PO TABS: 0.5000 mg | ORAL_TABLET | Freq: Every day | ORAL | 0 refills | Status: AC

## 2021-02-01 MED ORDER — SODIUM CHLORIDE 0.9 % IV SOLN
1.0000 g | INTRAVENOUS | Status: DC
  Filled 2021-02-01: qty 1

## 2021-02-01 MED ORDER — SODIUM CHLORIDE 0.9% FLUSH
10.0000 mL | INTRAVENOUS | Status: DC | PRN
  Administered 2021-02-01: 10 mL

## 2021-02-01 MED ORDER — TRAMADOL HCL 50 MG PO TABS: 75.0000 mg | ORAL_TABLET | Freq: Two times a day (BID) | ORAL | 0 refills | Status: AC

## 2021-02-01 MED ORDER — APIXABAN 5 MG PO TABS: 5.0000 mg | ORAL_TABLET | Freq: Two times a day (BID) | ORAL | Status: AC

## 2021-02-01 MED ORDER — OLANZAPINE 2.5 MG PO TABS: 2.5000 mg | ORAL_TABLET | Freq: Every day | ORAL | 0 refills | Status: AC

## 2021-02-01 MED ORDER — VANCOMYCIN IV (FOR PTA / DISCHARGE USE ONLY): 1250.0000 mg | INTRAVENOUS | 0 refills | Status: AC

## 2021-02-01 MED ORDER — ERTAPENEM IV (FOR PTA / DISCHARGE USE ONLY): 1.0000 g | INTRAVENOUS | 0 refills | Status: AC

## 2021-02-01 NOTE — Progress Notes (Signed)
PHARMACY CONSULT NOTE FOR:  OUTPATIENT  PARENTERAL ANTIBIOTIC THERAPY (OPAT)  Indication: PJI Regimen:  Vancomycin 1250 mg IV Q24H Ertapenem 1g IV Q24H End date: 03/23/21  IV antibiotic discharge orders are pended. To discharging provider:  please sign these orders via discharge navigator,  Select New Orders & click on the button choice - Manage This Unsigned Work.     Thank you for allowing pharmacy to be a part of this patient's care.  Lestine Box, PharmD PGY2 Infectious Diseases Pharmacy Resident   Please check [email or web address] for unit-specific pharmacy phone numbers

## 2021-02-01 NOTE — Progress Notes (Signed)
Peripherally Inserted Central Catheter Placement  The IV Nurse has discussed with the patient and/or persons authorized to consent for the patient, the purpose of this procedure and the potential benefits and risks involved with this procedure.  The benefits include less needle sticks, lab draws from the catheter, and the patient may be discharged home with the catheter. Risks include, but not limited to, infection, bleeding, blood clot (thrombus formation), and puncture of an artery; nerve damage and irregular heartbeat and possibility to perform a PICC exchange if needed/ordered by physician.  Alternatives to this procedure were also discussed.  Bard Power PICC patient education guide, fact sheet on infection prevention and patient information card has been provided to patient /or left at bedside.    PICC Placement Documentation  PICC Single Lumen 30/86/57 Left Basilic 46 cm 0 cm (Active)  Indication for Insertion or Continuance of Line Home intravenous therapies (PICC only) 02/01/21 1006  Exposed Catheter (cm) 0 cm 02/01/21 1006  Site Assessment Clean;Dry;Intact 02/01/21 1006  Line Status Flushed;Saline locked;Blood return noted 02/01/21 1006  Dressing Type Securing device;Transparent 02/01/21 1006  Dressing Status Clean;Dry;Intact 02/01/21 1006  Antimicrobial disc in place? Yes 02/01/21 1006  Safety Lock Not Applicable 84/69/62 [id number]       Frances Maywood 02/01/2021, 10:09 AM

## 2021-02-01 NOTE — TOC Benefit Eligibility Note (Signed)
Patient Advocate Encounter   Received notification that prior authorization for Nuzyra 150 mg tablets is required.   PA submitted on 02/01/2021 [place] [id number] Status is pending       Lyndel Safe, [place] Patient Advocate Specialist [place] Patient Advocate Team Direct Number: [phone]  Fax: [phone]

## 2021-02-01 NOTE — Progress Notes (Signed)
[place] place for the third time, no one picking up my call.

## 2021-02-01 NOTE — Progress Notes (Signed)
Subjective:  No new complaints   Antibiotics:  Anti-infectives (From admission, onward)    Start     Dose/Rate Route Frequency Ordered Stop   02/01/21 0000  ertapenem [facility]) IVPB        1 g Intravenous Every 24 hours 02/01/21 0958 03/23/21 2359   02/01/21 0000  vancomycin IVPB        1,250 mg Intravenous Every 24 hours 02/01/21 0958 03/23/21 2359   01/31/21 1400  meropenem (MERREM) 1 g in sodium chloride 0.9 % 100 mL IVPB        1 g 200 mL/hr over 30 Minutes Intravenous Every 8 hours 01/31/21 1148     01/27/21 2100  vancomycin (VANCOREADY) IVPB 1250 mg/250 mL        1,250 mg 166.7 mL/hr over 90 Minutes Intravenous Every 24 hours 01/26/21 1957     01/27/21 1200  meropenem (MERREM) 2 g in sodium chloride 0.9 % 100 mL IVPB  Status:  Discontinued        2 g 200 mL/hr over 30 Minutes Intravenous Every 8 hours 01/27/21 1103 01/31/21 1148   01/26/21 2100  cefTRIAXone (ROCEPHIN) 2 g in sodium chloride 0.9 % 100 mL IVPB  Status:  Discontinued        2 g 200 mL/hr over 30 Minutes Intravenous Every 24 hours 01/26/21 1943 01/27/21 1051   01/26/21 2100  vancomycin (VANCOREADY) IVPB 1500 mg/300 mL        1,500 mg 150 mL/hr over 120 Minutes Intravenous  Once 01/26/21 1952 01/27/21 0028   01/26/21 1230  ceFAZolin (ANCEF) IVPB 2g/100 mL premix        2 g 200 mL/hr over 30 Minutes Intravenous On call to O.R. 01/26/21 1224 01/26/21 1710       Medications: Scheduled Meds:  amiodarone  200 mg Oral Daily   apixaban  5 mg Oral BID   clonazePAM  0.25 mg Oral Daily   clonazePAM  0.5 mg Oral QHS   docusate sodium  100 mg Oral BID   famotidine  20 mg Oral Daily   feeding supplement  237 mL Oral TID BM   ferrous sulfate  325 mg Oral Q breakfast   gabapentin  300 mg Oral QHS   insulin aspart  0-5 Units Subcutaneous QHS   insulin aspart  0-9 Units Subcutaneous TID WC   insulin glargine-yfgn  4 Units Subcutaneous BID   melatonin  5 mg Oral QHS   metoprolol tartrate  12.5 mg Oral  BID   multivitamin with minerals  1 tablet Oral Daily   OLANZapine  2.5 mg Oral QHS   polyethylene glycol  17 g Oral BID   Rotigotine  1 patch Transdermal QHS   thiamine  100 mg Oral Daily   traMADol  75 mg Oral BID   Continuous Infusions:  meropenem (MERREM) IV 1 g (02/01/21 1448)   vancomycin Stopped (01/31/21 2229)   PRN Meds:.HYDROmorphone (DILAUDID) injection, methocarbamol, OLANZapine, ondansetron **OR** ondansetron (ZOFRAN) IV, sodium chloride flush    Objective: Weight change:   Intake/Output Summary (Last 24 hours) at 02/01/2021 1737 Last data filed at 02/01/2021 1500 Gross per 24 hour  Intake 1365.06 ml  Output 400 ml  Net 965.06 ml    Blood pressure (!) 128/58, pulse 69, temperature 97.6 F (36.4 C), temperature source Oral, resp. rate 14, height 5' 10"  (1.778 m), weight 70.3 kg, SpO2 99 %. Temp:  [97.6 F (36.4 C)-98.8 F (37.1 C)] 97.6  F (36.4 C) (10/27 1547) Pulse Rate:  [69-85] 69 (10/27 1547) Resp:  [14-18] 14 (10/27 1547) BP: (105-153)/(58-74) 128/58 (10/27 1547) SpO2:  [98 %-100 %] 99 % (10/27 1547)  Physical Exam: Physical Exam Vitals reviewed.  HENT:     Head: Normocephalic and atraumatic.  Cardiovascular:     Rate and Rhythm: Normal rate.  Pulmonary:     Effort: Pulmonary effort is normal. No respiratory distress.     Breath sounds: No wheezing.  Abdominal:     General: There is no distension.  Neurological:     General: No focal deficit present.     Mental Status: He is alert.  Psychiatric:        Behavior: Behavior normal.        Thought Content: Thought content normal.        Cognition and Memory: Memory is impaired. He exhibits impaired recent memory and impaired remote memory.     Wound vacuum in place  CBC:    BMET No results for input(s): NA, K, CL, CO2, GLUCOSE, BUN, CREATININE, CALCIUM in the last 72 hours.    Liver Panel  No results for input(s): PROT, ALBUMIN, AST, ALT, ALKPHOS, BILITOT, BILIDIR, IBILI in the  last 72 hours.     Sedimentation Rate No results for input(s): ESRSEDRATE in the last 72 hours. C-Reactive Protein No results for input(s): CRP in the last 72 hours.  Micro Results: Recent Results (from the past 720 hour(s))  Resp Panel by RT-PCR (Flu A&B, Covid) Nasopharyngeal Swab     Status: None   Collection Time: 01/08/21  1:10 AM   Specimen: Nasopharyngeal Swab; Nasopharyngeal(NP) swabs in vial transport medium  Result Value Ref Range Status   SARS Coronavirus 2 by RT PCR NEGATIVE NEGATIVE Final    Comment: (NOTE) SARS-CoV-2 target nucleic acids are NOT DETECTED.  The SARS-CoV-2 RNA is generally detectable in upper respiratory specimens during the acute phase of infection. The lowest concentration of SARS-CoV-2 viral copies this assay can detect is 138 copies/mL. A negative result does not preclude SARS-Cov-2 infection and should not be used as the sole basis for treatment or other patient management decisions. A negative result may occur with  improper specimen collection/handling, submission of specimen other than nasopharyngeal swab, presence of viral mutation(s) within the areas targeted by this assay, and inadequate number of viral copies(<138 copies/mL). A negative result must be combined with clinical observations, patient history, and epidemiological information. The expected result is Negative.  Fact Sheet for Patients:  EntrepreneurPulse.com.au  Fact Sheet for Healthcare Providers:  IncredibleEmployment.be  This test is no t yet approved or cleared by the [place] FDA and  has been authorized for detection and/or diagnosis of SARS-CoV-2 by FDA under an Emergency Use Authorization (EUA). This EUA will remain  in effect (meaning this test can be used) for the duration of the COVID-19 declaration under Section 564(b)(1) of the Act, 21 U.S.C.section 360bbb-3(b)(1), unless the authorization is terminated  or revoked  sooner.       Influenza A by PCR NEGATIVE NEGATIVE Final   Influenza B by PCR NEGATIVE NEGATIVE Final    Comment: (NOTE) The Xpert Xpress SARS-CoV-2/FLU/RSV plus assay is intended as an aid in the diagnosis of influenza from Nasopharyngeal swab specimens and should not be used as a sole basis for treatment. Nasal washings and aspirates are unacceptable for Xpert Xpress SARS-CoV-2/FLU/RSV testing.  Fact Sheet for Patients: EntrepreneurPulse.com.au  Fact Sheet for Healthcare Providers: IncredibleEmployment.be  This test is not  yet approved or cleared by the [place] and has been authorized for detection and/or diagnosis of SARS-CoV-2 by FDA under an Emergency Use Authorization (EUA). This EUA will remain in effect (meaning this test can be used) for the duration of the COVID-19 declaration under Section 564(b)(1) of the Act, 21 U.S.C. section 360bbb-3(b)(1), unless the authorization is terminated or revoked.  Performed at [facility], [place] [address] [place], [place] [address]   Fungus Culture With Stain     Status: None (Preliminary result)   Collection Time: 01/26/21  5:04 PM   Specimen: PATH Other; Tissue  Result Value Ref Range Status   Fungus Stain Final report  Final    Comment: (NOTE) Performed At: [facility] [place], [place] [id number] Rush Farmer MD [id number]    Fungus (Mycology) Culture PENDING  Incomplete   Fungal Source [facility] A  Final    Comment: Performed at [facility], [place] [address], [place], [place] [address]  Fungus Culture With Stain     Status: None (Preliminary result)   Collection Time: 01/26/21  5:04 PM   Specimen: PATH Other; Tissue  Result Value Ref Range Status   Fungus Stain Final report  Final    Comment: (NOTE) Performed At: [facility] [place], [place] [id number] Rush Farmer MD [id number]    Fungus (Mycology) Culture  PENDING  Incomplete   Fungal Source [facility] B  Final    Comment: Performed at [facility], [place] [address] [place], [place] [address]  Aerobic/Anaerobic Culture w Gram Stain (surgical/deep wound)     Status: None   Collection Time: 01/26/21  5:04 PM   Specimen: PATH Other; Tissue  Result Value Ref Range Status   Specimen Description SYNOVIAL FLUID  Final   Special Requests SPEC A  Final   Gram Stain   Final    NO SQUAMOUS EPITHELIAL CELLS SEEN FEW WBC SEEN NO ORGANISMS SEEN    Culture   Final    FEW METHICILLIN RESISTANT STAPHYLOCOCCUS AUREUS RARE KLEBSIELLA PNEUMONIAE Confirmed Extended Spectrum Beta-Lactamase Producer (ESBL).  In bloodstream infections from ESBL organisms, carbapenems are preferred over piperacillin/tazobactam. They are shown to have a lower risk of mortality. NO ANAEROBES ISOLATED Performed at [facility], [place] [address] [place], [place] [address]    Report Status 02/01/2021 FINAL  Final   Organism ID, Bacteria KLEBSIELLA PNEUMONIAE  Final   Organism ID, Bacteria METHICILLIN RESISTANT STAPHYLOCOCCUS AUREUS  Final      Susceptibility   Klebsiella pneumoniae - MIC*    AMPICILLIN >=32 RESISTANT Resistant     CEFAZOLIN >=64 RESISTANT Resistant     CEFEPIME 2 SENSITIVE Sensitive     CEFTAZIDIME RESISTANT Resistant     CEFTRIAXONE >=64 RESISTANT Resistant     CIPROFLOXACIN 2 RESISTANT Resistant     GENTAMICIN >=16 RESISTANT Resistant     IMIPENEM <=0.25 SENSITIVE Sensitive     TRIMETH/SULFA >=320 RESISTANT Resistant     AMPICILLIN/SULBACTAM >=32 RESISTANT Resistant     PIP/TAZO 16 SENSITIVE Sensitive     * RARE KLEBSIELLA PNEUMONIAE   Methicillin resistant staphylococcus aureus - MIC*    CIPROFLOXACIN >=8 RESISTANT Resistant     ERYTHROMYCIN >=8 RESISTANT Resistant     GENTAMICIN <=0.5 SENSITIVE Sensitive     OXACILLIN >=4 RESISTANT Resistant     TETRACYCLINE <=1 SENSITIVE Sensitive     VANCOMYCIN 1 SENSITIVE Sensitive     TRIMETH/SULFA >=320  RESISTANT  Resistant     CLINDAMYCIN <=0.25 SENSITIVE Sensitive     RIFAMPIN <=0.5 SENSITIVE Sensitive     Inducible Clindamycin NEGATIVE Sensitive     * FEW METHICILLIN RESISTANT STAPHYLOCOCCUS AUREUS  Aerobic/Anaerobic Culture w Gram Stain (surgical/deep wound)     Status: None   Collection Time: 01/26/21  5:04 PM   Specimen: PATH Other; Tissue  Result Value Ref Range Status   Specimen Description TISSUE  Final   Special Requests IT BAND 1 SPEC B  Final   Gram Stain   Final    NO SQUAMOUS EPITHELIAL CELLS SEEN MODERATE WBC SEEN NO ORGANISMS SEEN    Culture   Final    RARE METHICILLIN RESISTANT STAPHYLOCOCCUS AUREUS RARE KLEBSIELLA PNEUMONIAE SUSCEPTIBILITIES PERFORMED ON PREVIOUS CULTURE WITHIN THE LAST 5 DAYS. NO ANAEROBES ISOLATED Performed at [facility], [place] [address] [place], [place] [address]    Report Status 02/01/2021 FINAL  Final   Organism ID, Bacteria METHICILLIN RESISTANT STAPHYLOCOCCUS AUREUS  Final      Susceptibility   Methicillin resistant staphylococcus aureus - MIC*    CIPROFLOXACIN >=8 RESISTANT Resistant     ERYTHROMYCIN >=8 RESISTANT Resistant     GENTAMICIN <=0.5 SENSITIVE Sensitive     OXACILLIN >=4 RESISTANT Resistant     TETRACYCLINE <=1 SENSITIVE Sensitive     VANCOMYCIN 1 SENSITIVE Sensitive     TRIMETH/SULFA >=320 RESISTANT Resistant     CLINDAMYCIN <=0.25 SENSITIVE Sensitive     RIFAMPIN <=0.5 SENSITIVE Sensitive     Inducible Clindamycin NEGATIVE Sensitive     * RARE METHICILLIN RESISTANT STAPHYLOCOCCUS AUREUS  Acid Fast Smear (AFB)     Status: None   Collection Time: 01/26/21  5:04 PM   Specimen: PATH Other; Tissue  Result Value Ref Range Status   AFB Specimen Processing Concentration  Final   Acid Fast Smear Negative  Final    Comment: (NOTE) Performed At: [facility] [place], [place] [id number] Rush Farmer MD [id number]    Source (AFB) SYNOVIAL FLUID  Final    Comment: Performed at [facility], [address], [place], [place] [address]  Acid Fast Smear (AFB)     Status: None   Collection Time: 01/26/21  5:04 PM   Specimen: PATH Other; Tissue  Result Value Ref Range Status   AFB Specimen Processing Comment  Final    Comment: Tissue Grinding and Digestion/Decontamination   Acid Fast Smear Negative  Final    Comment: (NOTE) Performed At: [facility] [place], [place] [id number] Rush Farmer MD PH:[id number]    Source (AFB) IT BAND 1  Final    Comment: Performed at [facility], [place] [address], [place],  [address]  Fungus Culture Result     Status: None   Collection Time: 01/26/21  5:04 PM  Result Value Ref Range Status   Result 1 Comment  Final    Comment: (NOTE) KOH/Calcofluor preparation:  no fungus observed. Performed At: [facility] [place], [place] [id number] Rush Farmer MD [id number]   Fungus Culture Result     Status: None   Collection Time: 01/26/21  5:04 PM  Result Value Ref Range Status   Result 1 Comment  Final    Comment: (NOTE) KOH/Calcofluor preparation:  no fungus observed. Performed At: [facility] [place], [place] [id number] Rush Farmer MD [id number]   Fungus Culture With Stain     Status: None (Preliminary result)  Collection Time: 01/26/21  5:05 PM   Specimen: PATH Other; Tissue  Result Value Ref Range Status   Fungus Stain Final report  Final    Comment: (NOTE) Performed At: [facility] [place], [place] [id number] Rush Farmer MD [id number]    Fungus (Mycology) Culture PENDING  Incomplete   Fungal Source [facility] C  Final    Comment: Performed at [facility], [place] [address], [place], [place] [address]  Aerobic/Anaerobic Culture w Gram Stain (surgical/deep wound)     Status: None (Preliminary result)   Collection Time: 01/26/21  5:05 PM   Specimen: PATH Other; Tissue  Result Value Ref Range Status    Specimen Description TISSUE  Final   Special Requests IT BAND 2 SPEC C  Final   Gram Stain   Final    FEW SQUAMOUS EPITHELIAL CELLS PRESENT MODERATE WBC SEEN NO ORGANISMS SEEN    Culture   Final    RARE METHICILLIN RESISTANT STAPHYLOCOCCUS AUREUS RARE KLEBSIELLA PNEUMONIAE Confirmed Extended Spectrum Beta-Lactamase Producer (ESBL).  In bloodstream infections from ESBL organisms, carbapenems are preferred over piperacillin/tazobactam. They are shown to have a lower risk of mortality. NO ANAEROBES ISOLATED; CULTURE IN PROGRESS FOR 5 DAYS Sent to [place] for further susceptibility testing. Performed at [facility], [place] [address] [place], [place] [address]    Report Status PENDING  Incomplete   Organism ID, Bacteria METHICILLIN RESISTANT STAPHYLOCOCCUS AUREUS  Final   Organism ID, Bacteria KLEBSIELLA PNEUMONIAE  Final      Susceptibility   Klebsiella pneumoniae - MIC*    AMPICILLIN >=32 RESISTANT Resistant     CEFAZOLIN >=64 RESISTANT Resistant     CEFEPIME 2 SENSITIVE Sensitive     CEFTAZIDIME RESISTANT Resistant     CEFTRIAXONE >=64 RESISTANT Resistant     CIPROFLOXACIN 1 RESISTANT Resistant     GENTAMICIN >=16 RESISTANT Resistant     IMIPENEM <=0.25 SENSITIVE Sensitive     TRIMETH/SULFA >=320 RESISTANT Resistant     AMPICILLIN/SULBACTAM >=32 RESISTANT Resistant     PIP/TAZO 16 SENSITIVE Sensitive     * RARE KLEBSIELLA PNEUMONIAE   Methicillin resistant staphylococcus aureus - MIC*    CIPROFLOXACIN >=8 RESISTANT Resistant     ERYTHROMYCIN >=8 RESISTANT Resistant     GENTAMICIN <=0.5 SENSITIVE Sensitive     OXACILLIN >=4 RESISTANT Resistant     TETRACYCLINE <=1 SENSITIVE Sensitive     VANCOMYCIN 1 SENSITIVE Sensitive     TRIMETH/SULFA >=320 RESISTANT Resistant     CLINDAMYCIN <=0.25 SENSITIVE Sensitive     RIFAMPIN <=0.5 SENSITIVE Sensitive     Inducible Clindamycin NEGATIVE Sensitive     * RARE METHICILLIN RESISTANT STAPHYLOCOCCUS AUREUS  Acid Fast Smear (AFB)      Status: None   Collection Time: 01/26/21  5:05 PM   Specimen: PATH Other; Tissue  Result Value Ref Range Status   AFB Specimen Processing Comment  Final    Comment: Tissue Grinding and Digestion/Decontamination   Acid Fast Smear Negative  Final    Comment: (NOTE) Performed At: [facility] [place], [place] [id number] Rush Farmer MD RW:[id number]    Source (AFB) IT BAND 2  Final    Comment: Performed at [facility], [place] [address], [place], [place] [address]  Fungus Culture Result     Status: None   Collection Time: 01/26/21  5:05 PM  Result Value Ref Range Status   Result 1 Comment  Final    Comment: (  NOTE) KOH/Calcofluor preparation:  no fungus observed. Performed At: [facility] [place], [place] [id number] Rush Farmer MD [id number]   Culture, blood (routine x 2)     Status: None   Collection Time: 01/26/21  9:40 PM   Specimen: BLOOD RIGHT HAND  Result Value Ref Range Status   Specimen Description BLOOD RIGHT HAND  Final   Special Requests   Final    BOTTLES DRAWN AEROBIC ONLY Blood Culture results may not be optimal due to an inadequate volume of blood received in culture bottles   Culture   Final    NO GROWTH 5 DAYS Performed at [facility], [place] [address] [place], [place] [address]    Report Status 01/31/2021 FINAL  Final  Culture, blood (routine x 2)     Status: None   Collection Time: 01/26/21  9:51 PM   Specimen: BLOOD LEFT ARM  Result Value Ref Range Status   Specimen Description BLOOD LEFT ARM  Final   Special Requests   Final    BOTTLES DRAWN AEROBIC ONLY Blood Culture results may not be optimal due to an inadequate volume of blood received in culture bottles   Culture   Final    NO GROWTH 5 DAYS Performed at [facility], [place] [address] [place],  [address]    Report Status 01/31/2021 FINAL  Final  Resp Panel by RT-PCR (Flu A&B, Covid) Nasopharyngeal Swab     Status: None    Collection Time: 01/29/21 10:43 AM   Specimen: Nasopharyngeal Swab; Nasopharyngeal(NP) swabs in vial transport medium  Result Value Ref Range Status   SARS Coronavirus 2 by RT PCR NEGATIVE NEGATIVE Final    Comment: (NOTE) SARS-CoV-2 target nucleic acids are NOT DETECTED.  The SARS-CoV-2 RNA is generally detectable in upper respiratory specimens during the acute phase of infection. The lowest concentration of SARS-CoV-2 viral copies this assay can detect is 138 copies/mL. A negative result does not preclude SARS-Cov-2 infection and should not be used as the sole basis for treatment or other patient management decisions. A negative result may occur with  improper specimen collection/handling, submission of specimen other than nasopharyngeal swab, presence of viral mutation(s) within the areas targeted by this assay, and inadequate number of viral copies(<138 copies/mL). A negative result must be combined with clinical observations, patient history, and epidemiological information. The expected result is Negative.  Fact Sheet for Patients:  EntrepreneurPulse.com.au  Fact Sheet for Healthcare Providers:  IncredibleEmployment.be  This test is no t yet approved or cleared by the [place] FDA and  has been authorized for detection and/or diagnosis of SARS-CoV-2 by FDA under an Emergency Use Authorization (EUA). This EUA will remain  in effect (meaning this test can be used) for the duration of the COVID-19 declaration under Section 564(b)(1) of the Act, 21 U.S.C.section 360bbb-3(b)(1), unless the authorization is terminated  or revoked sooner.       Influenza A by PCR NEGATIVE NEGATIVE Final   Influenza B by PCR NEGATIVE NEGATIVE Final    Comment: (NOTE) The Xpert Xpress SARS-CoV-2/FLU/RSV plus assay is intended as an aid in the diagnosis of influenza from Nasopharyngeal swab specimens and should not be used as a sole basis for treatment.  Nasal washings and aspirates are unacceptable for Xpert Xpress SARS-CoV-2/FLU/RSV testing.  Fact Sheet for Patients: EntrepreneurPulse.com.au  Fact Sheet for Healthcare Providers: IncredibleEmployment.be  This test is not yet approved or cleared by the [place] and has been authorized  for detection and/or diagnosis of SARS-CoV-2 by FDA under an Emergency Use Authorization (EUA). This EUA will remain in effect (meaning this test can be used) for the duration of the COVID-19 declaration under Section 564(b)(1) of the Act, 21 U.S.C. section 360bbb-3(b)(1), unless the authorization is terminated or revoked.  Performed at [facility], [place] [address] [place], [place] [address]   SARS CORONAVIRUS 2 (TAT 6-24 HRS) Nasopharyngeal Nasopharyngeal Swab     Status: None   Collection Time: 01/31/21  1:19 PM   Specimen: Nasopharyngeal Swab  Result Value Ref Range Status   SARS Coronavirus 2 NEGATIVE NEGATIVE Final    Comment: (NOTE) SARS-CoV-2 target nucleic acids are NOT DETECTED.  The SARS-CoV-2 RNA is generally detectable in upper and lower respiratory specimens during the acute phase of infection. Negative results do not preclude SARS-CoV-2 infection, do not rule out co-infections with other pathogens, and should not be used as the sole basis for treatment or other patient management decisions. Negative results must be combined with clinical observations, patient history, and epidemiological information. The expected result is Negative.  Fact Sheet for Patients: SugarRoll.be  Fact Sheet for Healthcare Providers: https://www.woods-mathews.com/  This test is not yet approved or cleared by the [place] FDA and  has been authorized for detection and/or diagnosis of SARS-CoV-2 by FDA under an Emergency Use Authorization (EUA). This EUA will remain  in effect (meaning this test can be used)  for the duration of the COVID-19 declaration under Se ction 564(b)(1) of the Act, 21 U.S.C. section 360bbb-3(b)(1), unless the authorization is terminated or revoked sooner.  Performed at [facility], [place] [address], [place], [place] [address]     Studies/Results: [place] EKG SITE RITE  Result Date: 01/31/2021 If [facility] image not attached, placement could not be confirmed due to current cardiac rhythm.     Assessment/Plan:  INTERVAL HISTORY:   ESBL and MRSA grew from both cultures obtained in OR  Principal Problem:   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HCC) Active Problems:   Essential hypertension   Parkinsonism (HCC)   Panic disorder without agoraphobia   GERD (gastroesophageal reflux disease)   Diabetic peripheral neuropathy (HCC)   Chronic kidney disease, stage 3a (HCC)   Slow transit constipation   PAF (paroxysmal atrial fibrillation) (HCC)   ESBL (extended spectrum beta-lactamase) producing bacteria infection   Staphylococcus aureus infection    Jason Hartman is a 85 y.o. 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   Diagnosis: PJI of hip  Culture Result: ESBL Klebsiella and MRSA   Allergies  Allergen Reactions   Oxycodone Anaphylaxis   Iodinated Diagnostic Agents Hives    alleric to renografin,isovue & omnipaque, hives, requires 13 hr prep//a.[place]houn,  Onset Date: [id number]      Iodine Hives and Rash    alleric to renografin,isovue &  omnipaque, hives, requires 13 hr prep//a.[place]houn, Onset Date: [id number] allergic to renografin,isovue & omnipaque, hives, requires 13 hr prep//a.calhoun, Onset Date: [id number]   Linezolid Hives and Rash        Methadone Hcl Other (See Comments)    hallucinations    Promethazine Other (See Comments)    Delirium (pulled out IV), erratic behavior Mental status change     Isovue [Iopamidol] Hives   Methadone    Morphine Sulfate Other (See Comments)    Unknown reaction   Omnipaque [Iohexol] Hives    Code: HIVES, Desc: alleric to renografin,isovue & omnipaque, hives, requires 13 hr prep//a.calhoun, Onset Date: [id number]    Quetiapine Other (See Comments)    Unknown reaction   Renografin [Diatrizoate] Hives   Statins Other (See Comments)    Muscle weakness   Zolpidem Other (See Comments)    Erratic behavior/delusions Altered mental status   Atorvastatin Itching and Other (See Comments)    Tired, weakness    Carbidopa-Levodopa Anxiety   Chlorhexidine Gluconate [Chlorhexidine] Hives and Rash   Clindamycin Rash   Colesevelam Other (See Comments)    tired    Doxazosin Rash   Lovastatin Other (See Comments)    Tired, nervousness    Rosuvastatin Other (See Comments)    Tired, weakness     OPAT Orders Discharge antibiotics to be given via PICC line  Discharge antibiotics:Ertapenem 1 gram daily and vancomycin  Per pharmacy protocol  Aim for Vancomycin trough 15-20 or AUC 400-550 (unless otherwise indicated) Duration:  Ertapenem 3 months  Vancomycin 6 weeks   End Date:  Ertapenem January 12th, 2023  Vancomcyin December 1st, 2022  [facility] Per Protocol:  Home health RN for IV administration and teaching; PICC line care and labs.    Labs weekly while on IV antibiotics:  While on Vancomycin  BIWEEKLY  _x_ BMP w GFR  _x_ Vancomycin troughs  When on invanz along weekly BMP w  GFR  Weekly labs  _x_ CBC with differential _x_ BMP   _x_ CRP _x_ ESR   __ Please pull PIC at completion of IV antibiotics x__ Please leave PIC in place until doctor has seen patient or been notified  Fax weekly labs to [phone]  Clinic Follow Up Appt:    SAADIQ POCHE has an appointment on 02/19/2021 at 230PM with Dr. Juleen China  [facility] is located in the [facility] at  [place] in [place].  [address], which is located to the left of the elevators.  Phone: [phone]  Fax: [phone]  [email or web address]  He should arrive 15-30 minutes prior to his appointment  I will sign off for now  Please call with further questions.     LOS: 6 days   Alcide Evener 02/01/2021, 5:37 PM

## 2021-02-01 NOTE — Progress Notes (Signed)
   Subjective: Pain well controlled. Abx narrowed per ID. He has had good appetite. Limited ability in working with PT.  Objective:   VITALS:   Vitals:   01/31/21 0756 01/31/21 1631 01/31/21 2055 02/01/21 0520  BP: (!) 111/53 (!) 123/54 105/72 (!) 129/59  Pulse: 88 77 85 75  Resp: 16 16  18   Temp: 98.2 F (36.8 C) 98.2 F (36.8 C) 98.8 F (37.1 C) 98.3 F (36.8 C)  TempSrc: Oral Oral Oral Oral  SpO2: 97% 100% 99% 98%  Weight:      Height:        Neurologically intact Neurovascular intact Intact pulses distally Vac in place holding suction  Lab Results  Component Value Date   WBC 8.2 01/29/2021   HGB 8.5 (L) 01/29/2021   HCT 27.9 (L) 01/29/2021   MCV 92.4 01/29/2021   PLT 260 01/29/2021     Assessment/Plan:  6 Days Post-Op    - Patient to work with PT/OT to optimize mobilization safely - DVT ppx - SCDs, ambulation, heparin 5000 TID - Appreciate ID recs, abx selection, pending PICC placement for long term antibiotic treatment - WBAT operative extremity   Jason Hartman 02/01/2021, 8:43 AM

## 2021-02-01 NOTE — Progress Notes (Signed)
[facility] picked up patient to transport to [facility]. Report not given due to [facility] not answering phones.

## 2021-02-01 NOTE — TOC Benefit Eligibility Note (Signed)
Patient Advocate Encounter  Prior Authorization for Nuzyra 150 mg tablets has been approved.    PA# [id number] Effective dates: 02/01/2021 through 04/07/2022  Patients co-pay is $771.65.     Lyndel Safe, [place] Patient Advocate Specialist [place] Patient Advocate Team Direct Number: [phone]  Fax: [phone]

## 2021-02-01 NOTE — Discharge Instructions (Signed)
     Discharge Instructions    Attending Surgeon: Vanetta Mulders, MD Office Phone Number: [phone]   Diagnosis and Procedures:    Surgeries Performed Left hip irrigation and debridement  Discharge Plan:    Diet: Resume usual diet. Begin with light or bland foods.  Drink plenty of fluids.  Activity:  Weight bearing as tolerated left leg You are advised to go home directly from the hospital or surgical center. Restrict your activities.  GENERAL INSTRUCTIONS: 1.  Keep your surgical site elevated above your heart for at least 5-7 days or longer to prevent swelling. This will improve your comfort and your overall recovery following surgery.     2. Please call Dr. Eddie Dibbles office at [phone] with questions Monday-Friday during business hours. If no one answers, please leave a message and someone should get back to the patient within 24 hours. For emergencies please call 911 or proceed to the emergency room.   3. Patient to notify surgical team if experiences any of the following: Bowel/Bladder dysfunction, uncontrolled pain, nerve/muscle weakness, incision with increased drainage or redness, nausea/vomiting and Fever greater than 101.0 F.  Be alert for signs of infection including redness, streaking, odor, fever or chills. Be alert for excessive pain or bleeding and notify your surgeon immediately.  WOUND INSTRUCTIONS:   Leave your wound vac in place until followup.  Keep it clean and dry.

## 2021-02-01 NOTE — TOC Progression Note (Signed)
Transition of Care [facility]) - Progression Note    Patient Details  Name: Jason Hartman MRN: [id number] Date of Birth: [birth year]  [facility] CM/SW Contact  Emeterio Reeve, [place] Phone Number: 02/01/2021, 2:10 PM  Clinical Narrative:     Patient will DC to: [name] Anticipated DC date: 02/01/21 Family notified: Wife, Consuello Masse Transport by: Corey Harold     Per MD patient ready for DC to [facility]. RN, patient, patient's family, and facility notified of DC. Discharge Summary and FL2 sent to facility. DC packet on chart. Insurance Josem Kaufmann has been received and pt is covid negative. Ambulance transport requested for patient.    RN to call report to [phone].  CSW will sign off for now as social work intervention is no longer needed. Please consult [place] again if new needs arise.   Expected Discharge Plan: [place] Barriers to Discharge: Continued Medical Work up  Expected Discharge Plan and Services Expected Discharge Plan: [place] In-house Referral: Clinical Social Work     Living arrangements for the past 2 months: [place] (From [facility] short term rehab) Expected Discharge Date: 02/01/21                                     Social Determinants of Health (SDOH) Interventions    Readmission Risk Interventions No flowsheet data found.  Emeterio Reeve, LCSW Clinical Social Worker

## 2021-02-01 NOTE — Progress Notes (Signed)
Called SNF for report but transferred to voicemail X 2.

## 2021-02-01 NOTE — Discharge Summary (Signed)
Physician Discharge Summary  Jason Hartman YIF:[id number] DOB: 08-24-1935 DOA: 01/26/2021  PCP: [name], MD  Admit date: 01/26/2021 Discharge date: 02/01/2021  Admitted From: SNF Disposition:  SNF  Recommendations for Outpatient Follow-up:  Follow up with PCP in 1-2 weeks Drug monitoring , drug levels as instructed below and send to ID clinic as below.  Home Health:NA  Equipment/Devices:NA   Discharge Condition:Fair  CODE STATUS:Full code  Diet recommendation: low salt, low carb diet   Discharge Summary: 85 year old gentleman with history of recent left hip hemiarthroplasty on 9/26 after a fall, has history of Parkinson's, essential hypertension, CKD stage IIIa and paroxysmal A. fib along with previous history of ESBL who presented from skilled nursing rehab with ongoing left hip wound.  Patient was brought to orthopedics office with drainage from the site of the hip hemiarthroplasty that was done on 9/26.  He also suffered a fall at rehab.  He was taking oral antibiotics for presumed infection of the hip.  Patient was brought to the operating room and admitted to hospitalist service after undergoing debridement and washout on 10/21.     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 MRSA and ESBL Klebsiella on the local cultures. 10/21, debridement and wound VAC placement.  Followed by orthopedics and infectious disease.  Not a multistage surgical procedure candidate due to extensive underlying medical issues.  Probably long-term antibiotics treatment. Currently remains on vancomycin and meropenem, surgical wound cultures with MRSA and ESBL Klebsiella.  Followed by ID.  Anticipate prolonged IV antibiotics.  PICC line today. Pain relief with tramadol Weightbearing as tolerated Wound VAC to be kept until surgical follow-up.   Essential hypertension: Blood pressure fairly stable.  Antihypertensives  with holding parameters.   Paroxysmal A. fib: Rate controlled on amiodarone and metoprolol.  Eliquis resumed that was held previously with hematuria.  Type 2 diabetes with peripheral neuropathy: Blood sugars are stable.  Resume insulin doses.   Anxiety/panic attacks: Patient on Klonopin, melatonin at night, gabapentin and Zyprexa at night.  Continued.  Poor prognosis.  Unable to remove hardware.  Chronic suppressive antibiotic therapy as a scheduled.  Discharge back to skilled nursing facility for antibiotics and ongoing PT OT.     Discharge Diagnoses:  Principal Problem:   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[place]) Active Problems:   Essential hypertension   Parkinsonism (HCC)   Panic disorder without agoraphobia   GERD (gastroesophageal reflux disease)   Diabetic peripheral neuropathy (HCC)   Chronic kidney disease, stage 3a (HCC)   Slow transit constipation   PAF (paroxysmal atrial fibrillation) (HCC)   ESBL (extended spectrum beta-lactamase) producing bacteria infection   Staphylococcus aureus infection    Discharge Instructions  Discharge Instructions     [facility] pharmacist to adjust dose for Vancomycin, Aminoglycosides and other anti-infective therapies as requested by physician.   Complete by: As directed    [facility] to provide Cath Flo 46m   Complete by: As directed    Administer for PICC line occlusion and as ordered by physician for other access device issues.   Anaphylaxis Kit: Provided to treat any anaphylactic reaction to the medication being provided to the patient if First Dose or when requested by physician   Complete by: As directed    Epinephrine 176mml vial / amp: Administer 0.38m85m0.38ml638mubcutaneously once for moderate to severe anaphylaxis, nurse to call physician and pharmacy  when reaction occurs and call 911 if needed for immediate care   Diphenhydramine 75m/ml IV vial: Administer 25-585mIV/IM PRN for first dose  reaction, rash, itching, mild reaction, nurse to call physician and pharmacy when reaction occurs   Sodium Chloride 0.9% NS 50046mV: Administer if needed for hypovolemic blood pressure drop or as ordered by physician after call to physician with anaphylactic reaction   Change dressing on IV access line weekly and PRN   Complete by: As directed    Diet - low sodium heart healthy   Complete by: As directed    Discharge wound care:   Complete by: As directed    Negative Pressure Wound Therapy Hip Right   Flush IV access with Sodium Chloride 0.9% and Heparin 10 units/ml or 100 units/ml   Complete by: As directed    Home infusion instructions - [facility]   Complete by: As directed    Instructions: Flush IV access with Sodium Chloride 0.9% and Heparin 10units/ml or 100units/ml   Change dressing on IV access line: Weekly and PRN   Instructions Cath Flo 2mg87mdminister for PICC Line occlusion and as ordered by physician for other access device   [facility] pharmacist to adjust dose for: Vancomycin, Aminoglycosides and other anti-infective therapies as requested by physician   Increase activity slowly   Complete by: As directed    Method of administration may be changed at the discretion of home infusion pharmacist based upon assessment of the patient and/or caregiver's ability to self-administer the medication ordered   Complete by: As directed       Allergies as of 02/01/2021       Reactions   Oxycodone Anaphylaxis   Iodinated Diagnostic Agents Hives   alleric to renografin,isovue & omnipaque, hives, requires 13 hr prep//a.calhoun, Onset Date: [id number] Hives, Rash   alleric to renografin,isovue & omnipaque, hives, requires 13 hr prep//a.calhoun, Onset Date: [id number] to renografin,isovue & omnipaque, hives, requires 13 hr prep//a.calhoun, Onset Date: [id number] Hives, Rash      Methadone Hcl Other (See Comments)   hallucinations    Promethazine Other (See Comments)   Delirium (pulled out IV), erratic behavior Mental status change   Isovue [iopamidol] Hives   Methadone    Morphine Sulfate Other (See Comments)   Unknown reaction   Omnipaque [iohexol] Hives   Code: HIVES, Desc: alleric to renografin,isovue & omnipaque, hives, requires 13 hr prep//a.calhoun, Onset Date: [id number] Other (See Comments)   Unknown reaction   Renografin [diatrizoate] Hives   Statins Other (See Comments)   Muscle weakness   Zolpidem Other (See Comments)   Erratic behavior/delusions Altered mental status   Atorvastatin Itching, Other (See Comments)   Tired, weakness   Carbidopa-levodopa Anxiety   Chlorhexidine Gluconate [chlorhexidine] Hives, Rash   Clindamycin Rash   Colesevelam Other (See Comments)   tired   Doxazosin Rash   Lovastatin Other (See Comments)   Tired, nervousness   Rosuvastatin Other (See Comments)   Tired, weakness        Medication List     STOP taking these medications    cefTRIAXone 1 g injection Commonly known as: ROCEPHIN   HYDROcodone-acetaminophen 5-325 MG tablet Commonly known as: NORCO/VICODIN       TAKE these medications    acetaminophen 500 MG tablet Commonly known as: TYLENOL Take 1,000 mg by mouth every 8 (eight) hours.   Acidophilus Lactobacillus Caps Take 1 capsule by mouth  every evening.   amiodarone 200 MG tablet Commonly known as: PACERONE Take 1 tablet (200 mg total) by mouth daily.   apixaban 5 MG Tabs tablet Commonly known as: ELIQUIS Take 1 tablet (5 mg total) by mouth 2 (two) times daily.   clonazePAM 0.25 MG disintegrating tablet Commonly known as: KLONOPIN Take 1 tablet (0.25 mg total) by mouth every morning.   clonazePAM 0.5 MG tablet Commonly known as: KLONOPIN Take 1 tablet (0.5 mg total) by mouth at bedtime.   ertapenem  IVPB Commonly known as: INVANZ Inject 1 g into the vein daily. Indication:  PJI First Dose: Yes Last Day of Therapy:   03/23/21 Labs - Once weekly:  CBC/D and BMP, Labs - Every other week:  ESR and CRP Method of administration: Mini-Bag Plus / Gravity Method of administration may be changed at the discretion of home infusion pharmacist based upon assessment of the patient and/or caregiver's ability to self-administer the medication ordered.   famotidine 20 MG tablet Commonly known as: PEPCID Take 20 mg by mouth every morning.   ferrous sulfate 325 (65 FE) MG EC tablet Take 325 mg by mouth every morning.   gabapentin 300 MG capsule Commonly known as: NEURONTIN Take 1 capsule (300 mg total) by mouth at bedtime.   Lantus SoloStar 100 UNIT/ML Solostar Pen Generic drug: insulin glargine Inject 8 Units into the skin 2 (two) times daily.   melatonin 5 MG Tabs Take 5 mg by mouth at bedtime.   metFORMIN 500 MG 24 hr tablet Commonly known as: GLUCOPHAGE-XR Take 500 mg by mouth daily with breakfast.   methocarbamol 500 MG tablet Commonly known as: ROBAXIN Take 1 tablet (500 mg total) by mouth every 6 (six) hours as needed for muscle spasms.   metoprolol tartrate 25 MG tablet Commonly known as: LOPRESSOR Take 0.5 tablets (12.5 mg total) by mouth 2 (two) times daily.   Neupro 8 MG/24HR Pt24 Generic drug: Rotigotine Place 1 patch onto the skin at bedtime. For Parkinson's   nystatin powder Commonly known as: MYCOSTATIN/NYSTOP Apply 1 application topically 2 (two) times daily. To rash on back, groin, and sacrum   OLANZapine 2.5 MG tablet Commonly known as: ZYPREXA Take 1 tablet (2.5 mg total) by mouth at bedtime. What changed: additional instructions   Ozempic (0.25 or 0.5 MG/DOSE) 2 MG/1.5ML Sopn Generic drug: Semaglutide(0.25 or 0.5MG/DOS) Inject 0.5 mg into the skin every Tuesday.   polyethylene glycol powder 17 GM/SCOOP powder Commonly known as: GLYCOLAX/MIRALAX Take 17 g by mouth every morning. Mix in 8 oz water and drink   senna-docusate 8.6-50 MG tablet Commonly known as:  Senokot-S Take 1 tablet by mouth 2 (two) times daily as needed for moderate constipation.   thiamine 100 MG tablet Take 1 tablet (100 mg total) by mouth daily.   traMADol 50 MG tablet Commonly known as: ULTRAM Take 1.5 tablets (75 mg total) by mouth 2 (two) times daily.   vancomycin  IVPB Inject 1,250 mg into the vein daily. Indication:  PJI First Dose: Yes Last Day of Therapy:  03/23/21 Labs - Sunday/Monday:  CBC/D, BMP, and vancomycin trough. Labs - Thursday:  BMP and vancomycin trough Labs - Every other week:  ESR and CRP Method of administration:Elastomeric Method of administration may be changed at the discretion of the patient and/or caregiver's ability to self-administer the medication ordered.               Discharge Care Instructions  (From admission, onward)  Start     Ordered   02/01/21 0000  Change dressing on IV access line weekly and PRN  (Home infusion instructions - [facility] )        02/01/21 0958   02/01/21 0000  Discharge wound care:       Comments: Negative Pressure Wound Therapy Hip Right   02/01/21 5056            Follow-up Information     Vanetta Mulders, MD Follow up.   Specialty: Orthopedic Surgery Contact information: [address] [place] [address] [phone]                Allergies  Allergen Reactions   Oxycodone Anaphylaxis   Iodinated Diagnostic Agents Hives    alleric to renografin,isovue & omnipaque, hives, requires 13 hr prep//a.calhoun, Onset Date: [id number]      Iodine Hives and Rash    alleric to renografin,isovue & omnipaque, hives, requires 13 hr prep//a.calhoun, Onset Date: [id number] allergic to renografin,isovue & omnipaque, hives, requires 13 hr prep//a.calhoun, Onset Date: [id number]   Linezolid Hives and Rash        Methadone Hcl Other (See Comments)    hallucinations    Promethazine Other (See Comments)    Delirium (pulled out IV), erratic  behavior Mental status change     Isovue [Iopamidol] Hives   Methadone    Morphine Sulfate Other (See Comments)    Unknown reaction   Omnipaque [Iohexol] Hives    Code: HIVES, Desc: alleric to renografin,isovue & omnipaque, hives, requires 13 hr prep//a.calhoun, Onset Date: [id number]    Quetiapine Other (See Comments)    Unknown reaction   Renografin [Diatrizoate] Hives   Statins Other (See Comments)    Muscle weakness   Zolpidem Other (See Comments)    Erratic behavior/delusions Altered mental status   Atorvastatin Itching and Other (See Comments)    Tired, weakness    Carbidopa-Levodopa Anxiety   Chlorhexidine Gluconate [Chlorhexidine] Hives and Rash   Clindamycin Rash   Colesevelam Other (See Comments)    tired    Doxazosin Rash   Lovastatin Other (See Comments)    Tired, nervousness    Rosuvastatin Other (See Comments)    Tired, weakness     Consultations: Orthopedics Infectious disease   Procedures/Studies: CT ABDOMEN PELVIS WO CONTRAST  Result Date: 01/08/2021 CLINICAL DATA:  Abdominal pain and fever.  Urinary tract infection. EXAM: CT ABDOMEN AND PELVIS WITHOUT CONTRAST TECHNIQUE: Multidetector CT imaging of the abdomen and pelvis was performed following the standard protocol without IV contrast. COMPARISON:  Ultrasound 01/01/2021.  CT 10/25/2020. FINDINGS: Lower chest: Small dependent pleural effusions with dependent atelectasis, right more than left. Ingested material in the esophagus, question reflux versus poor peristalsis. Hepatobiliary: Liver parenchyma appears normal without contrast. No calcified gallstones. Pancreas: No significant pancreatic finding. Spleen: Normal Adrenals/Urinary Tract: Adrenal glands are normal. Small nonobstructing stones in the right kidney as seen previously. Small bilateral renal cysts as seen previously. No hydronephrosis or passing stone. There is nonspecific retroperitoneal edema. No evidence of a definable retroperitoneal  hemorrhage or drainable fluid collection. Bladder appears normal. Stomach/Bowel: No evidence of bowel obstruction. No acute inflammatory bowel process is identified. Moderate amount of stool in the left colon. Vascular/Lymphatic: Aortic atherosclerosis. No aneurysm. IVC is normal. No adenopathy. Reproductive: Normal Other: No intraperitoneal fluid or air. Musculoskeletal: Interval hip replacement on the left. Fluid in air in the soft tissues along the surgical approach. Given the recent surgery, this is indeterminate for  expected postoperative change versus postoperative infection. IMPRESSION: Small pleural effusions layering dependently with dependent atelectasis, right more than left. Nonobstructing calculi in the right kidney as seen previously. No hydronephrosis. Increase in retroperitoneal edema in a nonspecific fashion when compared to the previous exam. This may be due to the recent surgery and recumbent positioning. Interval left hip replacement. Fluid and air in the soft tissues in the region of the surgical approach is indeterminate for expected postoperative change versus postoperative infection. Electronically Signed   By: Nelson Chimes M.D.   On: 01/08/2021 11:21   CT HEAD WO CONTRAST (5MM)  Result Date: 01/11/2021 CLINICAL DATA:  85 year old male status recently discharged after being treated for a fall which resulted in left hip fracture. Repeat injury today. EXAM: CT HEAD WITHOUT CONTRAST TECHNIQUE: Contiguous axial images were obtained from the base of the skull through the vertex without intravenous contrast. COMPARISON:  Brain MRI 09/19/2020.  Head CT 10/11/2020. FINDINGS: Brain: Advanced chronic small vessel disease. Chronic bilateral basal ganglia and right thalamic lacunar infarcts with stable ex vacuo enlargement of the right lateral ventricle. Patchy additional bilateral white matter hypodensity. No midline shift, ventriculomegaly, mass effect, evidence of mass lesion, intracranial  hemorrhage or evidence of cortically based acute infarction. Vascular: Extensive Calcified atherosclerosis at the skull base. No suspicious intracranial vascular hyperdensity. 6 Skull: Stable. No acute osseous abnormality identified. Sinuses/Orbits: Low-density fluid level in the right sphenoid sinus has increased since July. But other Visualized paranasal sinuses and mastoids are clear. This appears inflammatory. Other: No acute orbit or scalp soft tissue injury identified. IMPRESSION: 1. No acute intracranial abnormality or acute traumatic injury identified. 2. Stable CT appearance of advanced chronic small vessel disease. 3. Acute right sphenoid sinusitis. Electronically Signed   By: Genevie Ann M.D.   On: 01/11/2021 04:54   CT NO CHARGE  Result Date: 01/20/2021 CLINICAL DATA:  Ascending aortic dilatation. EXAM: CT ADDITIONAL VIEWS AT NO CHARGE TECHNIQUE: Inadvertent chest CT without contrast was performed (a CT of the abdomen and pelvis was intended and obtained). CONTRAST:  None COMPARISON:  Chest CT 06/30/2017 FINDINGS: Normal heart size. No pericardial effusion. Aortic and coronary atherosclerosis. Dilated ascending aorta which measures up to 4.3 cm in diameter, unchanged from 2018. Semi-solid material throughout the esophagus. No visible mass or inflammation underlying. Atelectasis at the lung bases. Severe glenohumeral osteoarthritis, worse on the left. IMPRESSION: 1. Reportedly inadvertent chest CT performed at time of abdominal CT. 2. Atelectasis at the lung bases. 3. Food/material retained in the esophagus without underlying inflammation or visible mass. Please correlate for chronic dysphagia. 4. Dilated ascending aorta at 4.3 cm, unchanged from 2019. Recommend annual imaging followup by CTA or MRA. This recommendation follows 2010 ACCF/AHA/AATS/ACR/ASA/SCA/SCAI/SIR/STS/SVM Guidelines for the Diagnosis and Management of Patients with Thoracic Aortic Disease. Circulation. 2010; 121: Z858-I502. Aortic  aneurysm NOS (ICD10-I71.9) Electronically Signed   By: Jorje Guild M.D.   On: 01/20/2021 07:05   DG Hip Unilat W or Wo Pelvis 2-3 Views Right  Result Date: 01/11/2021 CLINICAL DATA:  85 year old male status recently discharged after being treated for a fall which resulted in left hip fracture. Repeat hip injury today. EXAM: DG HIP (WITH OR WITHOUT PELVIS) 2-3V RIGHT COMPARISON:  Postoperative pelvis radiograph 12/30/2020 FINDINGS: Proximal left femoral arthroplasty appears stable and normally located within the acetabulum. Postoperative soft tissue gas and skin staples persist along the left lateral thigh. The remaining visible left femur appears intact. Pelvis appears stable and intact. Stable and grossly intact proximal right femur.  Negative visible bowel gas. Calcified iliofemoral atherosclerosis. IMPRESSION: Stable recent left hip hemiarthroplasty. No new fracture or dislocation identified. Electronically Signed   By: Genevie Ann M.D.   On: 01/11/2021 04:50   [place] EKG SITE RITE  Result Date: 01/31/2021 If Site Rite image not attached, placement could not be confirmed due to current cardiac rhythm.  (Echo, Carotid, EGD, Colonoscopy, ERCP)    Subjective: Patient seen and examined.  Getting PICC line.  Had some pain on the left hip.  Bowel movements are regulated now.  Wife at the bedside.   Discharge Exam: Vitals:   02/01/21 0520 02/01/21 0847  BP: (!) 129/59 (!) 153/74  Pulse: 75 82  Resp: 18 16  Temp: 98.3 F (36.8 C) 98.2 F (36.8 C)  SpO2: 98% 100%   Vitals:   01/31/21 1631 01/31/21 2055 02/01/21 0520 02/01/21 0847  BP: (!) 123/54 105/72 (!) 129/59 (!) 153/74  Pulse: 77 85 75 82  Resp: 16  18 16   Temp: 98.2 F (36.8 C) 98.8 F (37.1 C) 98.3 F (36.8 C) 98.2 F (36.8 C)  TempSrc: Oral Oral Oral Oral  SpO2: 100% 99% 98% 100%  Weight:      Height:        General: Pt is alert, awake, not in acute distress Chronically sick looking.  Frail and debilitated.  Not in any  distress. Cardiovascular: RRR, S1/S2 +, no rubs, no gallops Respiratory: CTA bilaterally, no wheezing, no rhonchi Abdominal: Soft, NT, ND, bowel sounds + Extremities:  Left hip lateral thigh incision with wound VAC fetid.  Minimal secretions in the canister. Distal neurovascular status intact.    The results of significant diagnostics from this hospitalization (including imaging, microbiology, ancillary and laboratory) are listed below for reference.     Microbiology: Recent Results (from the past 240 hour(s))  Fungus Culture With Stain     Status: None (Preliminary result)   Collection Time: 01/26/21  5:04 PM   Specimen: PATH Other; Tissue  Result Value Ref Range Status   Fungus Stain Final report  Final    Comment: (NOTE) Performed At: [facility] [place], [place] [id number] Rush Farmer MD WI:[id number]    Fungus (Mycology) Culture PENDING  Incomplete   Fungal Source [facility] A  Final    Comment: Performed at [facility], [place] [address], [place], [place] [address]  Fungus Culture With Stain     Status: None (Preliminary result)   Collection Time: 01/26/21  5:04 PM   Specimen: PATH Other; Tissue  Result Value Ref Range Status   Fungus Stain Final report  Final    Comment: (NOTE) Performed At: [facility] [place], [place] [id number] Rush Farmer MD [id number]    Fungus (Mycology) Culture PENDING  Incomplete   Fungal Source [facility] B  Final    Comment: Performed at [facility], [place] [address], [place], [place] [address]  Aerobic/Anaerobic Culture w Gram Stain (surgical/deep wound)     Status: None (Preliminary result)   Collection Time: 01/26/21  5:04 PM   Specimen: PATH Other; Tissue  Result Value Ref Range Status   Specimen Description SYNOVIAL FLUID  Final   Special Requests SPEC A  Final   Gram Stain   Final    NO SQUAMOUS EPITHELIAL CELLS SEEN FEW WBC SEEN NO ORGANISMS SEEN Performed at [facility], [place] [address] [place], [place] [address]    Culture   Final  FEW METHICILLIN RESISTANT STAPHYLOCOCCUS AUREUS RARE KLEBSIELLA PNEUMONIAE Confirmed Extended Spectrum Beta-Lactamase Producer (ESBL).  In bloodstream infections from ESBL organisms, carbapenems are preferred over piperacillin/tazobactam. They are shown to have a lower risk of mortality. NO ANAEROBES ISOLATED; CULTURE IN PROGRESS FOR 5 DAYS    Report Status PENDING  Incomplete   Organism ID, Bacteria KLEBSIELLA PNEUMONIAE  Final   Organism ID, Bacteria METHICILLIN RESISTANT STAPHYLOCOCCUS AUREUS  Final      Susceptibility   Klebsiella pneumoniae - MIC*    AMPICILLIN >=32 RESISTANT Resistant     CEFAZOLIN >=64 RESISTANT Resistant     CEFEPIME 2 SENSITIVE Sensitive     CEFTAZIDIME RESISTANT Resistant     CEFTRIAXONE >=64 RESISTANT Resistant     CIPROFLOXACIN 2 RESISTANT Resistant     GENTAMICIN >=16 RESISTANT Resistant     IMIPENEM <=0.25 SENSITIVE Sensitive     TRIMETH/SULFA >=320 RESISTANT Resistant     AMPICILLIN/SULBACTAM >=32 RESISTANT Resistant     PIP/TAZO 16 SENSITIVE Sensitive     * RARE KLEBSIELLA PNEUMONIAE   Methicillin resistant staphylococcus aureus - MIC*    CIPROFLOXACIN >=8 RESISTANT Resistant     ERYTHROMYCIN >=8 RESISTANT Resistant     GENTAMICIN <=0.5 SENSITIVE Sensitive     OXACILLIN >=4 RESISTANT Resistant     TETRACYCLINE <=1 SENSITIVE Sensitive     VANCOMYCIN 1 SENSITIVE Sensitive     TRIMETH/SULFA >=320 RESISTANT Resistant     CLINDAMYCIN <=0.25 SENSITIVE Sensitive     RIFAMPIN <=0.5 SENSITIVE Sensitive     Inducible Clindamycin NEGATIVE Sensitive     * FEW METHICILLIN RESISTANT STAPHYLOCOCCUS AUREUS  Aerobic/Anaerobic Culture w Gram Stain (surgical/deep wound)     Status: None (Preliminary result)   Collection Time: 01/26/21  5:04 PM   Specimen: PATH Other; Tissue  Result Value Ref Range Status   Specimen Description TISSUE  Final   Special Requests IT BAND 1 SPEC B  Final    Gram Stain   Final    NO SQUAMOUS EPITHELIAL CELLS SEEN MODERATE WBC SEEN NO ORGANISMS SEEN    Culture   Final    RARE STAPHYLOCOCCUS AUREUS RARE KLEBSIELLA PNEUMONIAE SUSCEPTIBILITIES TO FOLLOW Performed at [facility], [address] [place], [place] [address]    Report Status PENDING  Incomplete  Acid Fast Smear (AFB)     Status: None   Collection Time: 01/26/21  5:04 PM   Specimen: PATH Other; Tissue  Result Value Ref Range Status   AFB Specimen Processing Concentration  Final   Acid Fast Smear Negative  Final    Comment: (NOTE) Performed At: [facility] [place], [place] [id number] Rush Farmer MD UU:[id number]    Source (AFB) SYNOVIAL FLUID  Final    Comment: Performed at [facility], [place] [address] [place], [place] [address]  Acid Fast Smear (AFB)     Status: None   Collection Time: 01/26/21  5:04 PM   Specimen: PATH Other; Tissue  Result Value Ref Range Status   AFB Specimen Processing Comment  Final    Comment: Tissue Grinding and Digestion/Decontamination   Acid Fast Smear Negative  Final    Comment: (NOTE) Performed At: [facility] [place], [place] [id number] Rush Farmer MD PP:[id number]    Source (AFB) IT BAND 1  Final    Comment: Performed at [facility], [place] [address], [place], [place] [address]  Fungus Culture Result     Status: None   Collection  Time: 01/26/21  5:04 PM  Result Value Ref Range Status   Result 1 Comment  Final    Comment: (NOTE) KOH/Calcofluor preparation:  no fungus observed. Performed At: [facility] [place], [place] [id number] Rush Farmer MD HU:[id number]   Fungus Culture Result     Status: None   Collection Time: 01/26/21  5:04 PM  Result Value Ref Range Status   Result 1 Comment  Final    Comment: (NOTE) KOH/Calcofluor preparation:  no fungus observed. Performed At: [facility] [place], [place]  [id number] Rush Farmer MD ZG:[id number]   Fungus Culture With Stain     Status: None (Preliminary result)   Collection Time: 01/26/21  5:05 PM   Specimen: PATH Other; Tissue  Result Value Ref Range Status   Fungus Stain Final report  Final    Comment: (NOTE) Performed At: [facility] [place], [place] [id number] Rush Farmer MD ZL:[id number]    Fungus (Mycology) Culture PENDING  Incomplete   Fungal Source [facility] C  Final    Comment: Performed at [facility], [place] [address], [place], [place] [address]  Aerobic/Anaerobic Culture w Gram Stain (surgical/deep wound)     Status: None (Preliminary result)   Collection Time: 01/26/21  5:05 PM   Specimen: PATH Other; Tissue  Result Value Ref Range Status   Specimen Description TISSUE  Final   Special Requests IT BAND 2 SPEC C  Final   Gram Stain   Final    FEW SQUAMOUS EPITHELIAL CELLS PRESENT MODERATE WBC SEEN NO ORGANISMS SEEN Performed at [facility], [place] [address] [place], [place] [address]    Culture   Final    RARE METHICILLIN RESISTANT STAPHYLOCOCCUS AUREUS RARE KLEBSIELLA PNEUMONIAE Confirmed Extended Spectrum Beta-Lactamase Producer (ESBL).  In bloodstream infections from ESBL organisms, carbapenems are preferred over piperacillin/tazobactam. They are shown to have a lower risk of mortality. NO ANAEROBES ISOLATED; CULTURE IN PROGRESS FOR 5 DAYS    Report Status PENDING  Incomplete   Organism ID, Bacteria METHICILLIN RESISTANT STAPHYLOCOCCUS AUREUS  Final   Organism ID, Bacteria KLEBSIELLA PNEUMONIAE  Final      Susceptibility   Klebsiella pneumoniae - MIC*    AMPICILLIN >=32 RESISTANT Resistant     CEFAZOLIN >=64 RESISTANT Resistant     CEFEPIME 2 SENSITIVE Sensitive     CEFTAZIDIME RESISTANT Resistant     CEFTRIAXONE >=64 RESISTANT Resistant     CIPROFLOXACIN 1 RESISTANT Resistant     GENTAMICIN >=16 RESISTANT Resistant     IMIPENEM <=0.25 SENSITIVE Sensitive     TRIMETH/SULFA  >=320 RESISTANT Resistant     AMPICILLIN/SULBACTAM >=32 RESISTANT Resistant     PIP/TAZO 16 SENSITIVE Sensitive     * RARE KLEBSIELLA PNEUMONIAE   Methicillin resistant staphylococcus aureus - MIC*    CIPROFLOXACIN >=8 RESISTANT Resistant     ERYTHROMYCIN >=8 RESISTANT Resistant     GENTAMICIN <=0.5 SENSITIVE Sensitive     OXACILLIN >=4 RESISTANT Resistant     TETRACYCLINE <=1 SENSITIVE Sensitive     VANCOMYCIN 1 SENSITIVE Sensitive     TRIMETH/SULFA >=320 RESISTANT Resistant     CLINDAMYCIN <=0.25 SENSITIVE Sensitive     RIFAMPIN <=0.5 SENSITIVE Sensitive     Inducible Clindamycin NEGATIVE Sensitive     * RARE METHICILLIN RESISTANT STAPHYLOCOCCUS AUREUS  Acid Fast Smear (AFB)     Status: None   Collection Time: 01/26/21  5:05 PM   Specimen: PATH Other; Tissue  Result Value Ref Range Status   AFB Specimen Processing Comment  Final    Comment: Tissue Grinding and Digestion/Decontamination   Acid Fast Smear Negative  Final    Comment: (NOTE) Performed At: [facility] [place], [place] [id number] Rush Farmer MD GU:[id number]    Source (AFB) IT BAND 2  Final    Comment: Performed at [facility], [place] [address] [place], [place] [address]  Fungus Culture Result     Status: None   Collection Time: 01/26/21  5:05 PM  Result Value Ref Range Status   Result 1 Comment  Final    Comment: (NOTE) KOH/Calcofluor preparation:  no fungus observed. Performed At: [facility] [place], [place] [id number] Rush Farmer MD OA:[id number]   Culture, blood (routine x 2)     Status: None   Collection Time: 01/26/21  9:40 PM   Specimen: BLOOD RIGHT HAND  Result Value Ref Range Status   Specimen Description BLOOD RIGHT HAND  Final   Special Requests   Final    BOTTLES DRAWN AEROBIC ONLY Blood Culture results may not be optimal due to an inadequate volume of blood received in culture bottles   Culture   Final    NO GROWTH 5 DAYS Performed  at [facility], [place] [address] [place], [place] [address]    Report Status 01/31/2021 FINAL  Final  Culture, blood (routine x 2)     Status: None   Collection Time: 01/26/21  9:51 PM   Specimen: BLOOD LEFT ARM  Result Value Ref Range Status   Specimen Description BLOOD LEFT ARM  Final   Special Requests   Final    BOTTLES DRAWN AEROBIC ONLY Blood Culture results may not be optimal due to an inadequate volume of blood received in culture bottles   Culture   Final    NO GROWTH 5 DAYS Performed at [facility], [place] [address], [place],  [address]    Report Status 01/31/2021 FINAL  Final  Resp Panel by RT-PCR (Flu A&B, Covid) Nasopharyngeal Swab     Status: None   Collection Time: 01/29/21 10:43 AM   Specimen: Nasopharyngeal Swab; Nasopharyngeal(NP) swabs in vial transport medium  Result Value Ref Range Status   SARS Coronavirus 2 by RT PCR NEGATIVE NEGATIVE Final    Comment: (NOTE) SARS-CoV-2 target nucleic acids are NOT DETECTED.  The SARS-CoV-2 RNA is generally detectable in upper respiratory specimens during the acute phase of infection. The lowest concentration of SARS-CoV-2 viral copies this assay can detect is 138 copies/mL. A negative result does not preclude SARS-Cov-2 infection and should not be used as the sole basis for treatment or other patient management decisions. A negative result may occur with  improper specimen collection/handling, submission of specimen other than nasopharyngeal swab, presence of viral mutation(s) within the areas targeted by this assay, and inadequate number of viral copies(<138 copies/mL). A negative result must be combined with clinical observations, patient history, and epidemiological information. The expected result is Negative.  Fact Sheet for Patients:  EntrepreneurPulse.com.au  Fact Sheet for Healthcare Providers:  IncredibleEmployment.be  This test is no t yet approved or  cleared by the [place] FDA and  has been authorized for detection and/or diagnosis of SARS-CoV-2 by FDA under an Emergency Use Authorization (EUA). This EUA will remain  in effect (meaning this test can be used) for the duration of the COVID-19 declaration under Section 564(b)(1) of the Act, 21  U.S.C.section 360bbb-3(b)(1), unless the authorization is terminated  or revoked sooner.       Influenza A by PCR NEGATIVE NEGATIVE Final   Influenza B by PCR NEGATIVE NEGATIVE Final    Comment: (NOTE) The Xpert Xpress SARS-CoV-2/FLU/RSV plus assay is intended as an aid in the diagnosis of influenza from Nasopharyngeal swab specimens and should not be used as a sole basis for treatment. Nasal washings and aspirates are unacceptable for Xpert Xpress SARS-CoV-2/FLU/RSV testing.  Fact Sheet for Patients: EntrepreneurPulse.com.au  Fact Sheet for Healthcare Providers: IncredibleEmployment.be  This test is not yet approved or cleared by the [place] FDA and has been authorized for detection and/or diagnosis of SARS-CoV-2 by FDA under an Emergency Use Authorization (EUA). This EUA will remain in effect (meaning this test can be used) for the duration of the COVID-19 declaration under Section 564(b)(1) of the Act, 21 U.S.C. section 360bbb-3(b)(1), unless the authorization is terminated or revoked.  Performed at [facility], [place] [address], [place],  [address]      Labs: BNP (last 3 results) Recent Labs    09/10/20 1108  BNP 69.4   Basic Metabolic Panel: Recent Labs  Lab 01/26/21 2140 01/27/21 0108 01/29/21 0123  NA 129* 132* 137  K 5.1 5.7* 3.8  CL 98 100 102  CO2 21* 24 27  GLUCOSE 220* 215* 163*  BUN 20 22 15   CREATININE 1.07 0.99 1.03  CALCIUM 8.4* 8.5* 8.2*   Liver Function Tests: No results for input(s): AST, ALT, ALKPHOS, BILITOT, PROT, ALBUMIN in the last 168 hours. No results for input(s): LIPASE, AMYLASE  in the last 168 hours. No results for input(s): AMMONIA in the last 168 hours. CBC: Recent Labs  Lab 01/27/21 0108 01/29/21 0123  WBC 11.3* 8.2  HGB 8.5* 8.5*  HCT 27.8* 27.9*  MCV 91.4 92.4  PLT 305 260   Cardiac Enzymes: No results for input(s): CKTOTAL, CKMB, CKMBINDEX, TROPONINI in the last 168 hours. BNP: Invalid input(s): POCBNP CBG: Recent Labs  Lab 01/30/21 2053 01/31/21 0755 01/31/21 1254 01/31/21 1628 01/31/21 2020  GLUCAP 190* 138* 150* 195* 161*   D-Dimer No results for input(s): DDIMER in the last 72 hours. Hgb A1c No results for input(s): HGBA1C in the last 72 hours. Lipid Profile No results for input(s): CHOL, HDL, LDLCALC, TRIG, CHOLHDL, LDLDIRECT in the last 72 hours. Thyroid function studies No results for input(s): TSH, T4TOTAL, T3FREE, THYROIDAB in the last 72 hours.  Invalid input(s): FREET3 Anemia work up No results for input(s): VITAMINB12, FOLATE, FERRITIN, TIBC, IRON, RETICCTPCT in the last 72 hours. Urinalysis    Component Value Date/Time   COLORURINE AMBER (A) 01/04/2021 1344   APPEARANCEUR TURBID (A) 01/04/2021 1344   LABSPEC 1.010 01/04/2021 1344   PHURINE 5.0 01/04/2021 1344   GLUCOSEU NEGATIVE 01/04/2021 1344   HGBUR MODERATE (A) 01/04/2021 1344   BILIRUBINUR NEGATIVE 01/04/2021 1344   KETONESUR NEGATIVE 01/04/2021 1344   PROTEINUR 100 (A) 01/04/2021 1344   UROBILINOGEN 0.2 12/04/2013 0210   NITRITE NEGATIVE 01/04/2021 1344   LEUKOCYTESUR LARGE (A) 01/04/2021 1344   Sepsis Labs Invalid input(s): PROCALCITONIN,  WBC,  LACTICIDVEN Microbiology Recent Results (from the past 240 hour(s))  Fungus Culture With Stain     Status: None (Preliminary result)   Collection Time: 01/26/21  5:04 PM   Specimen: PATH Other; Tissue  Result Value Ref Range Status   Fungus Stain Final report  Final    Comment: (NOTE) Performed At: [facility] [place] [address] [place], [place] [id number] Rush Farmer MD SW:[id number]    Fungus  (Mycology) Culture PENDING  Incomplete   Fungal Source [facility] A  Final    Comment: Performed at [facility], [place] [address] [place], [place] [address]  Fungus Culture With Stain     Status: None (Preliminary result)   Collection Time: 01/26/21  5:04 PM   Specimen: PATH Other; Tissue  Result Value Ref Range Status   Fungus Stain Final report  Final    Comment: (NOTE) Performed At: [facility] [place], [place] [id number] Rush Farmer MD JQ:[id number]    Fungus (Mycology) Culture PENDING  Incomplete   Fungal Source [facility] B  Final    Comment: Performed at [facility], [place] [address], [place], [place] [address]  Aerobic/Anaerobic Culture w Gram Stain (surgical/deep wound)     Status: None (Preliminary result)   Collection Time: 01/26/21  5:04 PM   Specimen: PATH Other; Tissue  Result Value Ref Range Status   Specimen Description SYNOVIAL FLUID  Final   Special Requests SPEC A  Final   Gram Stain   Final    NO SQUAMOUS EPITHELIAL CELLS SEEN FEW WBC SEEN NO ORGANISMS SEEN Performed at [facility], [place] [address] [place], [place] [address]    Culture   Final    FEW METHICILLIN RESISTANT STAPHYLOCOCCUS AUREUS RARE KLEBSIELLA PNEUMONIAE Confirmed Extended Spectrum Beta-Lactamase Producer (ESBL).  In bloodstream infections from ESBL organisms, carbapenems are preferred over piperacillin/tazobactam. They are shown to have a lower risk of mortality. NO ANAEROBES ISOLATED; CULTURE IN PROGRESS FOR 5 DAYS    Report Status PENDING  Incomplete   Organism ID, Bacteria KLEBSIELLA PNEUMONIAE  Final   Organism ID, Bacteria METHICILLIN RESISTANT STAPHYLOCOCCUS AUREUS  Final      Susceptibility   Klebsiella pneumoniae - MIC*    AMPICILLIN >=32 RESISTANT Resistant     CEFAZOLIN >=64 RESISTANT Resistant     CEFEPIME 2 SENSITIVE Sensitive     CEFTAZIDIME RESISTANT Resistant     CEFTRIAXONE >=64 RESISTANT Resistant     CIPROFLOXACIN 2 RESISTANT Resistant      GENTAMICIN >=16 RESISTANT Resistant     IMIPENEM <=0.25 SENSITIVE Sensitive     TRIMETH/SULFA >=320 RESISTANT Resistant     AMPICILLIN/SULBACTAM >=32 RESISTANT Resistant     PIP/TAZO 16 SENSITIVE Sensitive     * RARE KLEBSIELLA PNEUMONIAE   Methicillin resistant staphylococcus aureus - MIC*    CIPROFLOXACIN >=8 RESISTANT Resistant     ERYTHROMYCIN >=8 RESISTANT Resistant     GENTAMICIN <=0.5 SENSITIVE Sensitive     OXACILLIN >=4 RESISTANT Resistant     TETRACYCLINE <=1 SENSITIVE Sensitive     VANCOMYCIN 1 SENSITIVE Sensitive     TRIMETH/SULFA >=320 RESISTANT Resistant     CLINDAMYCIN <=0.25 SENSITIVE Sensitive     RIFAMPIN <=0.5 SENSITIVE Sensitive     Inducible Clindamycin NEGATIVE Sensitive     * FEW METHICILLIN RESISTANT STAPHYLOCOCCUS AUREUS  Aerobic/Anaerobic Culture w Gram Stain (surgical/deep wound)     Status: None (Preliminary result)   Collection Time: 01/26/21  5:04 PM   Specimen: PATH Other; Tissue  Result Value Ref Range Status   Specimen Description TISSUE  Final   Special Requests IT BAND 1 SPEC B  Final   Gram Stain   Final    NO SQUAMOUS EPITHELIAL CELLS SEEN MODERATE WBC SEEN NO ORGANISMS SEEN    Culture   Final    RARE STAPHYLOCOCCUS AUREUS RARE  KLEBSIELLA PNEUMONIAE SUSCEPTIBILITIES TO FOLLOW Performed at [facility], [place] [address], [place], [place] [address]    Report Status PENDING  Incomplete  Acid Fast Smear (AFB)     Status: None   Collection Time: 01/26/21  5:04 PM   Specimen: PATH Other; Tissue  Result Value Ref Range Status   AFB Specimen Processing Concentration  Final   Acid Fast Smear Negative  Final    Comment: (NOTE) Performed At: [facility] [place], [place] [id number] Rush Farmer MD UG:[id number]    Source (AFB) SYNOVIAL FLUID  Final    Comment: Performed at [facility], [place] [address], [place], [place] [address]  Acid Fast Smear (AFB)     Status: None   Collection Time: 01/26/21  5:04  PM   Specimen: PATH Other; Tissue  Result Value Ref Range Status   AFB Specimen Processing Comment  Final    Comment: Tissue Grinding and Digestion/Decontamination   Acid Fast Smear Negative  Final    Comment: (NOTE) Performed At: [facility] [place], [place] [id number] Rush Farmer MD AV:[id number]    Source (AFB) IT BAND 1  Final    Comment: Performed at [facility], [place] [address], [place], [place] [address]  Fungus Culture Result     Status: None   Collection Time: 01/26/21  5:04 PM  Result Value Ref Range Status   Result 1 Comment  Final    Comment: (NOTE) KOH/Calcofluor preparation:  no fungus observed. Performed At: [facility] [place], [place] [id number] Rush Farmer MD GB:[id number]   Fungus Culture Result     Status: None   Collection Time: 01/26/21  5:04 PM  Result Value Ref Range Status   Result 1 Comment  Final    Comment: (NOTE) KOH/Calcofluor preparation:  no fungus observed. Performed At: [facility] [place], [place] [id number] Rush Farmer MD IY:[id number]   Fungus Culture With Stain     Status: None (Preliminary result)   Collection Time: 01/26/21  5:05 PM   Specimen: PATH Other; Tissue  Result Value Ref Range Status   Fungus Stain Final report  Final    Comment: (NOTE) Performed At: [facility] [place], [place] [id number] Rush Farmer MD RP:[id number]    Fungus (Mycology) Culture PENDING  Incomplete   Fungal Source [facility] C  Final    Comment: Performed at [facility], [place] [address], [place], [place] [address]  Aerobic/Anaerobic Culture w Gram Stain (surgical/deep wound)     Status: None (Preliminary result)   Collection Time: 01/26/21  5:05 PM   Specimen: PATH Other; Tissue  Result Value Ref Range Status   Specimen Description TISSUE  Final   Special Requests IT BAND 2 SPEC C  Final   Gram Stain   Final    FEW SQUAMOUS EPITHELIAL  CELLS PRESENT MODERATE WBC SEEN NO ORGANISMS SEEN Performed at [facility], [place] [address], [place], [place] [address]    Culture   Final    RARE METHICILLIN RESISTANT STAPHYLOCOCCUS AUREUS RARE KLEBSIELLA PNEUMONIAE Confirmed Extended Spectrum Beta-Lactamase Producer (ESBL).  In bloodstream infections from ESBL organisms, carbapenems are preferred over piperacillin/tazobactam. They are shown to have a lower risk of mortality. NO ANAEROBES ISOLATED; CULTURE IN PROGRESS FOR 5 DAYS    Report Status PENDING  Incomplete   Organism ID, Bacteria METHICILLIN RESISTANT STAPHYLOCOCCUS AUREUS  Final   Organism ID,  Bacteria KLEBSIELLA PNEUMONIAE  Final      Susceptibility   Klebsiella pneumoniae - MIC*    AMPICILLIN >=32 RESISTANT Resistant     CEFAZOLIN >=64 RESISTANT Resistant     CEFEPIME 2 SENSITIVE Sensitive     CEFTAZIDIME RESISTANT Resistant     CEFTRIAXONE >=64 RESISTANT Resistant     CIPROFLOXACIN 1 RESISTANT Resistant     GENTAMICIN >=16 RESISTANT Resistant     IMIPENEM <=0.25 SENSITIVE Sensitive     TRIMETH/SULFA >=320 RESISTANT Resistant     AMPICILLIN/SULBACTAM >=32 RESISTANT Resistant     PIP/TAZO 16 SENSITIVE Sensitive     * RARE KLEBSIELLA PNEUMONIAE   Methicillin resistant staphylococcus aureus - MIC*    CIPROFLOXACIN >=8 RESISTANT Resistant     ERYTHROMYCIN >=8 RESISTANT Resistant     GENTAMICIN <=0.5 SENSITIVE Sensitive     OXACILLIN >=4 RESISTANT Resistant     TETRACYCLINE <=1 SENSITIVE Sensitive     VANCOMYCIN 1 SENSITIVE Sensitive     TRIMETH/SULFA >=320 RESISTANT Resistant     CLINDAMYCIN <=0.25 SENSITIVE Sensitive     RIFAMPIN <=0.5 SENSITIVE Sensitive     Inducible Clindamycin NEGATIVE Sensitive     * RARE METHICILLIN RESISTANT STAPHYLOCOCCUS AUREUS  Acid Fast Smear (AFB)     Status: None   Collection Time: 01/26/21  5:05 PM   Specimen: PATH Other; Tissue  Result Value Ref Range Status   AFB Specimen Processing Comment  Final    Comment: Tissue  Grinding and Digestion/Decontamination   Acid Fast Smear Negative  Final    Comment: (NOTE) Performed At: [facility] [place], [place] [id number] Rush Farmer MD QT:[id number]    Source (AFB) IT BAND 2  Final    Comment: Performed at [facility], [address], [place], [place] [address]  Fungus Culture Result     Status: None   Collection Time: 01/26/21  5:05 PM  Result Value Ref Range Status   Result 1 Comment  Final    Comment: (NOTE) KOH/Calcofluor preparation:  no fungus observed. Performed At: [facility] [place], [place] [id number] Rush Farmer MD GO:[id number]   Culture, blood (routine x 2)     Status: None   Collection Time: 01/26/21  9:40 PM   Specimen: BLOOD RIGHT HAND  Result Value Ref Range Status   Specimen Description BLOOD RIGHT HAND  Final   Special Requests   Final    BOTTLES DRAWN AEROBIC ONLY Blood Culture results may not be optimal due to an inadequate volume of blood received in culture bottles   Culture   Final    NO GROWTH 5 DAYS Performed at [facility], [place] [address], [place], [place] [address]    Report Status 01/31/2021 FINAL  Final  Culture, blood (routine x 2)     Status: None   Collection Time: 01/26/21  9:51 PM   Specimen: BLOOD LEFT ARM  Result Value Ref Range Status   Specimen Description BLOOD LEFT ARM  Final   Special Requests   Final    BOTTLES DRAWN AEROBIC ONLY Blood Culture results may not be optimal due to an inadequate volume of blood received in culture bottles   Culture   Final    NO GROWTH 5 DAYS Performed at [facility], [place] [address] [place], [place] [address]    Report Status 01/31/2021 FINAL  Final  Resp Panel by RT-PCR (Flu A&B, Covid) Nasopharyngeal Swab     Status:  None   Collection Time: 01/29/21 10:43 AM   Specimen: Nasopharyngeal Swab; Nasopharyngeal(NP) swabs in vial transport medium  Result Value Ref Range Status   SARS Coronavirus 2 by  RT PCR NEGATIVE NEGATIVE Final    Comment: (NOTE) SARS-CoV-2 target nucleic acids are NOT DETECTED.  The SARS-CoV-2 RNA is generally detectable in upper respiratory specimens during the acute phase of infection. The lowest concentration of SARS-CoV-2 viral copies this assay can detect is 138 copies/mL. A negative result does not preclude SARS-Cov-2 infection and should not be used as the sole basis for treatment or other patient management decisions. A negative result may occur with  improper specimen collection/handling, submission of specimen other than nasopharyngeal swab, presence of viral mutation(s) within the areas targeted by this assay, and inadequate number of viral copies(<138 copies/mL). A negative result must be combined with clinical observations, patient history, and epidemiological information. The expected result is Negative.  Fact Sheet for Patients:  EntrepreneurPulse.com.au  Fact Sheet for Healthcare Providers:  IncredibleEmployment.be  This test is no t yet approved or cleared by the [place] FDA and  has been authorized for detection and/or diagnosis of SARS-CoV-2 by FDA under an Emergency Use Authorization (EUA). This EUA will remain  in effect (meaning this test can be used) for the duration of the COVID-19 declaration under Section 564(b)(1) of the Act, 21 U.S.C.section 360bbb-3(b)(1), unless the authorization is terminated  or revoked sooner.       Influenza A by PCR NEGATIVE NEGATIVE Final   Influenza B by PCR NEGATIVE NEGATIVE Final    Comment: (NOTE) The Xpert Xpress SARS-CoV-2/FLU/RSV plus assay is intended as an aid in the diagnosis of influenza from Nasopharyngeal swab specimens and should not be used as a sole basis for treatment. Nasal washings and aspirates are unacceptable for Xpert Xpress SARS-CoV-2/FLU/RSV testing.  Fact Sheet for Patients: EntrepreneurPulse.com.au  Fact Sheet  for Healthcare Providers: IncredibleEmployment.be  This test is not yet approved or cleared by the [place] FDA and has been authorized for detection and/or diagnosis of SARS-CoV-2 by FDA under an Emergency Use Authorization (EUA). This EUA will remain in effect (meaning this test can be used) for the duration of the COVID-19 declaration under Section 564(b)(1) of the Act, 21 U.S.C. section 360bbb-3(b)(1), unless the authorization is terminated or revoked.  Performed at [facility], [place] [address], [place], [place] [address]      Time coordinating discharge:  35 minutes  SIGNED:   Barb Merino, MD  [facility] 02/01/2021, 9:58 AM

## 2021-02-02 DIAGNOSIS — R451 Restlessness and agitation: Secondary | ICD-10-CM | POA: Diagnosis not present

## 2021-02-02 DIAGNOSIS — M6281 Muscle weakness (generalized): Secondary | ICD-10-CM | POA: Diagnosis not present

## 2021-02-02 DIAGNOSIS — R278 Other lack of coordination: Secondary | ICD-10-CM | POA: Diagnosis not present

## 2021-02-02 DIAGNOSIS — R41841 Cognitive communication deficit: Secondary | ICD-10-CM | POA: Diagnosis not present

## 2021-02-02 DIAGNOSIS — L03116 Cellulitis of left lower limb: Secondary | ICD-10-CM | POA: Diagnosis not present

## 2021-02-02 DIAGNOSIS — T8452XD Infection and inflammatory reaction due to internal left hip prosthesis, subsequent encounter: Secondary | ICD-10-CM | POA: Diagnosis not present

## 2021-02-02 DIAGNOSIS — R2681 Unsteadiness on feet: Secondary | ICD-10-CM | POA: Diagnosis not present

## 2021-02-02 DIAGNOSIS — R4182 Altered mental status, unspecified: Secondary | ICD-10-CM | POA: Diagnosis not present

## 2021-02-02 DIAGNOSIS — I714 Abdominal aortic aneurysm, without rupture, unspecified: Secondary | ICD-10-CM | POA: Diagnosis not present

## 2021-02-02 DIAGNOSIS — S72002D Fracture of unspecified part of neck of left femur, subsequent encounter for closed fracture with routine healing: Secondary | ICD-10-CM | POA: Diagnosis not present

## 2021-02-02 DIAGNOSIS — F411 Generalized anxiety disorder: Secondary | ICD-10-CM | POA: Diagnosis not present

## 2021-02-02 LAB — MISC LABCORP TEST (SEND OUT)
LabCorp test name: 1
LabCorp test name: 1
Labcorp test code: 96388
Labcorp test code: 96388

## 2021-02-02 LAB — MINIMUM INHIBITORY CONC. (1 DRUG)

## 2021-02-02 LAB — MIC RESULT

## 2021-02-04 DIAGNOSIS — M6281 Muscle weakness (generalized): Secondary | ICD-10-CM | POA: Diagnosis not present

## 2021-02-04 DIAGNOSIS — T8452XD Infection and inflammatory reaction due to internal left hip prosthesis, subsequent encounter: Secondary | ICD-10-CM | POA: Diagnosis not present

## 2021-02-04 DIAGNOSIS — R2681 Unsteadiness on feet: Secondary | ICD-10-CM | POA: Diagnosis not present

## 2021-02-04 DIAGNOSIS — L03116 Cellulitis of left lower limb: Secondary | ICD-10-CM | POA: Diagnosis not present

## 2021-02-04 DIAGNOSIS — S72002D Fracture of unspecified part of neck of left femur, subsequent encounter for closed fracture with routine healing: Secondary | ICD-10-CM | POA: Diagnosis not present

## 2021-02-04 DIAGNOSIS — F411 Generalized anxiety disorder: Secondary | ICD-10-CM | POA: Diagnosis not present

## 2021-02-05 DIAGNOSIS — I480 Paroxysmal atrial fibrillation: Secondary | ICD-10-CM | POA: Diagnosis not present

## 2021-02-05 DIAGNOSIS — L03116 Cellulitis of left lower limb: Secondary | ICD-10-CM | POA: Diagnosis not present

## 2021-02-05 DIAGNOSIS — N1831 Chronic kidney disease, stage 3a: Secondary | ICD-10-CM | POA: Diagnosis not present

## 2021-02-05 DIAGNOSIS — F4323 Adjustment disorder with mixed anxiety and depressed mood: Secondary | ICD-10-CM | POA: Diagnosis not present

## 2021-02-05 DIAGNOSIS — F419 Anxiety disorder, unspecified: Secondary | ICD-10-CM | POA: Diagnosis not present

## 2021-02-05 DIAGNOSIS — E782 Mixed hyperlipidemia: Secondary | ICD-10-CM | POA: Diagnosis not present

## 2021-02-05 DIAGNOSIS — R451 Restlessness and agitation: Secondary | ICD-10-CM | POA: Diagnosis not present

## 2021-02-05 DIAGNOSIS — R296 Repeated falls: Secondary | ICD-10-CM | POA: Diagnosis not present

## 2021-02-05 DIAGNOSIS — S72002D Fracture of unspecified part of neck of left femur, subsequent encounter for closed fracture with routine healing: Secondary | ICD-10-CM | POA: Diagnosis not present

## 2021-02-05 DIAGNOSIS — R2681 Unsteadiness on feet: Secondary | ICD-10-CM | POA: Diagnosis not present

## 2021-02-05 DIAGNOSIS — I10 Essential (primary) hypertension: Secondary | ICD-10-CM | POA: Diagnosis not present

## 2021-02-05 DIAGNOSIS — M6281 Muscle weakness (generalized): Secondary | ICD-10-CM | POA: Diagnosis not present

## 2021-02-05 DIAGNOSIS — D509 Iron deficiency anemia, unspecified: Secondary | ICD-10-CM | POA: Diagnosis not present

## 2021-02-05 DIAGNOSIS — G20 Parkinson's disease: Secondary | ICD-10-CM | POA: Diagnosis not present

## 2021-02-05 DIAGNOSIS — E1122 Type 2 diabetes mellitus with diabetic chronic kidney disease: Secondary | ICD-10-CM | POA: Diagnosis not present

## 2021-02-05 DIAGNOSIS — T8142XA Infection following a procedure, deep incisional surgical site, initial encounter: Secondary | ICD-10-CM | POA: Diagnosis not present

## 2021-02-05 DIAGNOSIS — T8452XD Infection and inflammatory reaction due to internal left hip prosthesis, subsequent encounter: Secondary | ICD-10-CM | POA: Diagnosis not present

## 2021-02-05 DIAGNOSIS — F411 Generalized anxiety disorder: Secondary | ICD-10-CM | POA: Diagnosis not present

## 2021-02-06 ENCOUNTER — Ambulatory Visit (HOSPITAL_BASED_OUTPATIENT_CLINIC_OR_DEPARTMENT_OTHER): Payer: Self-pay | Admitting: Orthopaedic Surgery

## 2021-02-06 ENCOUNTER — Encounter (HOSPITAL_BASED_OUTPATIENT_CLINIC_OR_DEPARTMENT_OTHER): Payer: Medicare Other | Admitting: Orthopaedic Surgery

## 2021-02-06 DIAGNOSIS — L03116 Cellulitis of left lower limb: Secondary | ICD-10-CM | POA: Diagnosis not present

## 2021-02-06 DIAGNOSIS — I2510 Atherosclerotic heart disease of native coronary artery without angina pectoris: Secondary | ICD-10-CM | POA: Diagnosis not present

## 2021-02-06 DIAGNOSIS — R4182 Altered mental status, unspecified: Secondary | ICD-10-CM | POA: Diagnosis not present

## 2021-02-06 DIAGNOSIS — F411 Generalized anxiety disorder: Secondary | ICD-10-CM | POA: Diagnosis not present

## 2021-02-06 DIAGNOSIS — R278 Other lack of coordination: Secondary | ICD-10-CM | POA: Diagnosis not present

## 2021-02-06 DIAGNOSIS — T8452XD Infection and inflammatory reaction due to internal left hip prosthesis, subsequent encounter: Secondary | ICD-10-CM | POA: Diagnosis not present

## 2021-02-06 DIAGNOSIS — S72002D Fracture of unspecified part of neck of left femur, subsequent encounter for closed fracture with routine healing: Secondary | ICD-10-CM | POA: Diagnosis not present

## 2021-02-06 DIAGNOSIS — R2681 Unsteadiness on feet: Secondary | ICD-10-CM | POA: Diagnosis not present

## 2021-02-06 DIAGNOSIS — R6520 Severe sepsis without septic shock: Secondary | ICD-10-CM | POA: Diagnosis not present

## 2021-02-06 DIAGNOSIS — I714 Abdominal aortic aneurysm, without rupture, unspecified: Secondary | ICD-10-CM | POA: Diagnosis not present

## 2021-02-06 DIAGNOSIS — R41841 Cognitive communication deficit: Secondary | ICD-10-CM | POA: Diagnosis not present

## 2021-02-06 DIAGNOSIS — M6281 Muscle weakness (generalized): Secondary | ICD-10-CM | POA: Diagnosis not present

## 2021-02-07 ENCOUNTER — Other Ambulatory Visit: Payer: Self-pay | Admitting: *Deleted

## 2021-02-07 DIAGNOSIS — F411 Generalized anxiety disorder: Secondary | ICD-10-CM | POA: Diagnosis not present

## 2021-02-07 DIAGNOSIS — I10 Essential (primary) hypertension: Secondary | ICD-10-CM | POA: Diagnosis not present

## 2021-02-07 DIAGNOSIS — S72002D Fracture of unspecified part of neck of left femur, subsequent encounter for closed fracture with routine healing: Secondary | ICD-10-CM | POA: Diagnosis not present

## 2021-02-07 DIAGNOSIS — T8142XA Infection following a procedure, deep incisional surgical site, initial encounter: Secondary | ICD-10-CM | POA: Diagnosis not present

## 2021-02-07 DIAGNOSIS — F419 Anxiety disorder, unspecified: Secondary | ICD-10-CM | POA: Diagnosis not present

## 2021-02-07 DIAGNOSIS — G20 Parkinson's disease: Secondary | ICD-10-CM | POA: Diagnosis not present

## 2021-02-07 DIAGNOSIS — E1122 Type 2 diabetes mellitus with diabetic chronic kidney disease: Secondary | ICD-10-CM | POA: Diagnosis not present

## 2021-02-07 DIAGNOSIS — R451 Restlessness and agitation: Secondary | ICD-10-CM | POA: Diagnosis not present

## 2021-02-07 DIAGNOSIS — N1831 Chronic kidney disease, stage 3a: Secondary | ICD-10-CM | POA: Diagnosis not present

## 2021-02-07 DIAGNOSIS — R2681 Unsteadiness on feet: Secondary | ICD-10-CM | POA: Diagnosis not present

## 2021-02-07 DIAGNOSIS — E782 Mixed hyperlipidemia: Secondary | ICD-10-CM | POA: Diagnosis not present

## 2021-02-07 DIAGNOSIS — T8452XD Infection and inflammatory reaction due to internal left hip prosthesis, subsequent encounter: Secondary | ICD-10-CM | POA: Diagnosis not present

## 2021-02-07 DIAGNOSIS — M6281 Muscle weakness (generalized): Secondary | ICD-10-CM | POA: Diagnosis not present

## 2021-02-07 DIAGNOSIS — D509 Iron deficiency anemia, unspecified: Secondary | ICD-10-CM | POA: Diagnosis not present

## 2021-02-07 DIAGNOSIS — R4182 Altered mental status, unspecified: Secondary | ICD-10-CM | POA: Diagnosis not present

## 2021-02-07 DIAGNOSIS — I480 Paroxysmal atrial fibrillation: Secondary | ICD-10-CM | POA: Diagnosis not present

## 2021-02-07 NOTE — Patient Outreach (Signed)
Member screened for potential [facility] Care Management needs. Mr. Mckillop resides in [facility].   Update received from [place] indicating member is paying privately for now. No anticipated dc date at this time.    Marthenia Rolling, MSN, RN,BSN [place] Acute Care Coordinator [phone] [facility]) [phone]  (Toll free office)

## 2021-02-08 DIAGNOSIS — L97312 Non-pressure chronic ulcer of right ankle with fat layer exposed: Secondary | ICD-10-CM | POA: Diagnosis not present

## 2021-02-08 DIAGNOSIS — S72002D Fracture of unspecified part of neck of left femur, subsequent encounter for closed fracture with routine healing: Secondary | ICD-10-CM | POA: Diagnosis not present

## 2021-02-08 DIAGNOSIS — T8142XA Infection following a procedure, deep incisional surgical site, initial encounter: Secondary | ICD-10-CM | POA: Diagnosis not present

## 2021-02-08 DIAGNOSIS — I2510 Atherosclerotic heart disease of native coronary artery without angina pectoris: Secondary | ICD-10-CM | POA: Diagnosis not present

## 2021-02-08 DIAGNOSIS — D509 Iron deficiency anemia, unspecified: Secondary | ICD-10-CM | POA: Diagnosis not present

## 2021-02-08 DIAGNOSIS — M869 Osteomyelitis, unspecified: Secondary | ICD-10-CM | POA: Diagnosis not present

## 2021-02-08 DIAGNOSIS — R4182 Altered mental status, unspecified: Secondary | ICD-10-CM | POA: Diagnosis not present

## 2021-02-08 DIAGNOSIS — R2681 Unsteadiness on feet: Secondary | ICD-10-CM | POA: Diagnosis not present

## 2021-02-08 DIAGNOSIS — F411 Generalized anxiety disorder: Secondary | ICD-10-CM | POA: Diagnosis not present

## 2021-02-08 DIAGNOSIS — L97322 Non-pressure chronic ulcer of left ankle with fat layer exposed: Secondary | ICD-10-CM | POA: Diagnosis not present

## 2021-02-08 DIAGNOSIS — E1122 Type 2 diabetes mellitus with diabetic chronic kidney disease: Secondary | ICD-10-CM | POA: Diagnosis not present

## 2021-02-08 DIAGNOSIS — N1831 Chronic kidney disease, stage 3a: Secondary | ICD-10-CM | POA: Diagnosis not present

## 2021-02-08 DIAGNOSIS — I10 Essential (primary) hypertension: Secondary | ICD-10-CM | POA: Diagnosis not present

## 2021-02-08 DIAGNOSIS — G20 Parkinson's disease: Secondary | ICD-10-CM | POA: Diagnosis not present

## 2021-02-08 DIAGNOSIS — I480 Paroxysmal atrial fibrillation: Secondary | ICD-10-CM | POA: Diagnosis not present

## 2021-02-08 DIAGNOSIS — E782 Mixed hyperlipidemia: Secondary | ICD-10-CM | POA: Diagnosis not present

## 2021-02-08 DIAGNOSIS — M6281 Muscle weakness (generalized): Secondary | ICD-10-CM | POA: Diagnosis not present

## 2021-02-08 DIAGNOSIS — G4733 Obstructive sleep apnea (adult) (pediatric): Secondary | ICD-10-CM | POA: Diagnosis not present

## 2021-02-08 DIAGNOSIS — T8452XD Infection and inflammatory reaction due to internal left hip prosthesis, subsequent encounter: Secondary | ICD-10-CM | POA: Diagnosis not present

## 2021-02-09 ENCOUNTER — Other Ambulatory Visit: Payer: Self-pay

## 2021-02-09 ENCOUNTER — Ambulatory Visit (INDEPENDENT_AMBULATORY_CARE_PROVIDER_SITE_OTHER): Payer: Medicare Other | Admitting: Orthopaedic Surgery

## 2021-02-09 DIAGNOSIS — I480 Paroxysmal atrial fibrillation: Secondary | ICD-10-CM | POA: Diagnosis not present

## 2021-02-09 DIAGNOSIS — I10 Essential (primary) hypertension: Secondary | ICD-10-CM | POA: Diagnosis not present

## 2021-02-09 DIAGNOSIS — D509 Iron deficiency anemia, unspecified: Secondary | ICD-10-CM | POA: Diagnosis not present

## 2021-02-09 DIAGNOSIS — R059 Cough, unspecified: Secondary | ICD-10-CM | POA: Diagnosis not present

## 2021-02-09 DIAGNOSIS — T8452XA Infection and inflammatory reaction due to internal left hip prosthesis, initial encounter: Secondary | ICD-10-CM

## 2021-02-09 DIAGNOSIS — G20 Parkinson's disease: Secondary | ICD-10-CM | POA: Diagnosis not present

## 2021-02-09 DIAGNOSIS — Z7401 Bed confinement status: Secondary | ICD-10-CM | POA: Diagnosis not present

## 2021-02-09 DIAGNOSIS — N1831 Chronic kidney disease, stage 3a: Secondary | ICD-10-CM | POA: Diagnosis not present

## 2021-02-09 DIAGNOSIS — F419 Anxiety disorder, unspecified: Secondary | ICD-10-CM | POA: Diagnosis not present

## 2021-02-09 DIAGNOSIS — G894 Chronic pain syndrome: Secondary | ICD-10-CM | POA: Diagnosis not present

## 2021-02-09 DIAGNOSIS — E1122 Type 2 diabetes mellitus with diabetic chronic kidney disease: Secondary | ICD-10-CM | POA: Diagnosis not present

## 2021-02-09 DIAGNOSIS — T8142XA Infection following a procedure, deep incisional surgical site, initial encounter: Secondary | ICD-10-CM | POA: Diagnosis not present

## 2021-02-09 DIAGNOSIS — R531 Weakness: Secondary | ICD-10-CM | POA: Diagnosis not present

## 2021-02-09 NOTE — Progress Notes (Signed)
Post Operative Evaluation    Procedure/Date of Surgery: Left hip hemiarthroplasty done on December 30, 2020 status post irrigation debridement January 26, 2021  Interval History:   Jason Hartman presents today for 2-week follow-up after a left prosthetic hip infection irrigation debridement.  Hardware was retained at that time due to the morbidity involved in the procedure.  He overall has improved pain although his mobility remains quite limited.  He has been seen by infectious disease in the outpatient he continues to get IV antibiotics for treatment of the infection.  He and his wife are again here today.  He has hesitant to perform or pursue any type of  PMH/PSH/Family History/Social History/Meds/Allergies:    Past Medical History:  Diagnosis Date   CAD (coronary artery disease)    s/p stents   Cellulitis and abscess of leg, except foot    Hypertension    Obstructive sleep apnea (adult) (pediatric)    Restless legs syndrome (RLS)    Thoracic or lumbosacral neuritis or radiculitis, unspecified    Type II or unspecified type diabetes mellitus without mention of complication, not stated as uncontrolled    Unspecified disease of pericardium    Unspecified hereditary and idiopathic peripheral neuropathy    Past Surgical History:  Procedure Laterality Date   ANAL FISSURE REPAIR     ANTERIOR APPROACH HEMI HIP ARTHROPLASTY Left 12/30/2020   Procedure: ANTERIOR APPROACH HIP ARTHROPLASTY;  Surgeon: Vanetta Mulders, MD;  Location: [facility];  Service: Orthopedics;  Laterality: Left;   APPLICATION OF WOUND VAC Left 01/26/2021   Procedure: APPLICATION OF WOUND VAC;  Surgeon: Vanetta Mulders, MD;  Location: [facility];  Service: Orthopedics;  Laterality: Left;   I & D EXTREMITY Left 01/26/2021   Procedure: IRRIGATION AND DEBRIDEMENT LEFT HIP;  Surgeon: Vanetta Mulders, MD;  Location: [facility];  Service: Orthopedics;  Laterality: Left;  30 MIN   pericarditis  2009   Social  History   Socioeconomic History   Marital status: Married    Spouse name: Jason Hartman   Number of children: 2   Years of education: College   Highest education level: Not on file  Occupational History   Occupation: Marine scientist: [employer]  Tobacco Use   Smoking status: Former    Types: Cigarettes    Quit date: 09/01/1980    Years since quitting: 40.4   Smokeless tobacco: Never  Vaping Use   Vaping Use: Never used  Substance and Sexual Activity   Alcohol use: Yes    Comment: rare   Drug use: Never   Sexual activity: Not on file  Other Topics Concern   Not on file  Social History Narrative   Patient lives at home with spouse.   Caffeine Use: occasionally   Social Determinants of Health   Financial Resource Strain: Not on file  Food Insecurity: Not on file  Transportation Needs: Not on file  Physical Activity: Not on file  Stress: Not on file  Social Connections: Not on file   Family History  Problem Relation Age of Onset   Diabetes Father    Allergies  Allergen Reactions   Oxycodone Anaphylaxis   Iodinated Diagnostic Agents Hives    alleric to renografin,isovue & omnipaque, hives, requires 13 hr prep//a.[email or web address], Onset Date: [id number]      Iodine Hives and  Rash    alleric to renografin,isovue & omnipaque, hives, requires 13 hr prep//a.[email or web address], Onset Date: [id number] allergic to renografin,isovue & omnipaque, hives, requires 13 hr prep//a.[email or web address], Onset Date: [id number]   Linezolid Hives and Rash        Methadone Hcl Other (See Comments)    hallucinations    Promethazine Other (See Comments)    Delirium (pulled out IV), erratic behavior Mental status change     Isovue [Iopamidol] Hives   Methadone    Morphine Sulfate Other (See Comments)    Unknown reaction   Omnipaque [Iohexol] Hives    Code: HIVES, Desc: alleric to renografin,isovue & omnipaque, hives, requires 13 hr prep//a.[email or web address], Onset Date: [id number]    Quetiapine Other (See Comments)     Unknown reaction   Renografin [Diatrizoate] Hives   Statins Other (See Comments)    Muscle weakness   Zolpidem Other (See Comments)    Erratic behavior/delusions Altered mental status   Atorvastatin Itching and Other (See Comments)    Tired, weakness    Carbidopa-Levodopa Anxiety   Chlorhexidine Gluconate [Chlorhexidine] Hives and Rash   Clindamycin Rash   Colesevelam Other (See Comments)    tired    Doxazosin Rash   Lovastatin Other (See Comments)    Tired, nervousness    Rosuvastatin Other (See Comments)    Tired, weakness    Current Outpatient Medications  Medication Sig Dispense Refill   acetaminophen (TYLENOL) 500 MG tablet Take 1,000 mg by mouth every 8 (eight) hours.     Acidophilus Lactobacillus CAPS Take 1 capsule by mouth every evening.     amiodarone (PACERONE) 200 MG tablet Take 1 tablet (200 mg total) by mouth daily.     apixaban (ELIQUIS) 5 MG TABS tablet Take 1 tablet (5 mg total) by mouth 2 (two) times daily. 60 tablet    clonazePAM (KLONOPIN) 0.25 MG disintegrating tablet Take 1 tablet (0.25 mg total) by mouth every morning. 30 tablet 0   clonazePAM (KLONOPIN) 0.5 MG tablet Take 1 tablet (0.5 mg total) by mouth at bedtime. 30 tablet 0   ertapenem (INVANZ) IVPB Inject 1 g into the vein daily. Indication:  PJI First Dose: Yes Last Day of Therapy:  03/23/21 Labs - Once weekly:  CBC/D and BMP, Labs - Every other week:  ESR and CRP Method of administration: Mini-Bag Plus / Gravity Method of administration may be changed at the discretion of home infusion pharmacist based upon assessment of the patient and/or caregiver's ability to self-administer the medication ordered. 50 Units 0   famotidine (PEPCID) 20 MG tablet Take 20 mg by mouth every morning.     ferrous sulfate 325 (65 FE) MG EC tablet Take 325 mg by mouth every morning.     gabapentin (NEURONTIN) 300 MG capsule Take 1 capsule (300 mg total) by mouth at bedtime.     LANTUS SOLOSTAR 100 UNIT/ML  Solostar Pen Inject 8 Units into the skin 2 (two) times daily. 15 mL 11   melatonin 5 MG TABS Take 5 mg by mouth at bedtime.     metFORMIN (GLUCOPHAGE-XR) 500 MG 24 hr tablet Take 500 mg by mouth daily with breakfast.     methocarbamol (ROBAXIN) 500 MG tablet Take 1 tablet (500 mg total) by mouth every 6 (six) hours as needed for muscle spasms. 30 tablet 0   metoprolol tartrate (LOPRESSOR) 25 MG tablet Take 0.5 tablets (12.5 mg total) by mouth 2 (two) times daily.     nystatin (MYCOSTATIN/NYSTOP) powder Apply 1 application topically  2 (two) times daily. To rash on back, groin, and sacrum     OLANZapine (ZYPREXA) 2.5 MG tablet Take 1 tablet (2.5 mg total) by mouth at bedtime. 30 tablet 0   polyethylene glycol powder (GLYCOLAX/MIRALAX) 17 GM/SCOOP powder Take 17 g by mouth every morning. Mix in 8 oz water and drink     Rotigotine (NEUPRO) 8 MG/24HR PT24 Place 1 patch onto the skin at bedtime. For Parkinson's     Semaglutide,0.25 or 0.5MG/DOS, (OZEMPIC, 0.25 OR 0.5 MG/DOSE,) 2 MG/1.5ML SOPN Inject 0.5 mg into the skin every Tuesday.     senna-docusate (SENOKOT-S) 8.6-50 MG tablet Take 1 tablet by mouth 2 (two) times daily as needed for moderate constipation.     thiamine 100 MG tablet Take 1 tablet (100 mg total) by mouth daily.     traMADol (ULTRAM) 50 MG tablet Take 1.5 tablets (75 mg total) by mouth 2 (two) times daily. 30 tablet 0   vancomycin IVPB Inject 1,250 mg into the vein daily. Indication:  PJI First Dose: Yes Last Day of Therapy:  03/23/21 Labs - Sunday/Monday:  CBC/D, BMP, and vancomycin trough. Labs - Thursday:  BMP and vancomycin trough Labs - Every other week:  ESR and CRP Method of administration:Elastomeric Method of administration may be changed at the discretion of the patient and/or caregiver's ability to self-administer the medication ordered. 50 Units 0   No current facility-administered medications for this visit.   No results found.  Review of Systems:   A ROS was  performed including pertinent positives and negatives as documented in the HPI.   Musculoskeletal Exam:    There were no vitals taken for this visit.  Left wound VAC removed today.  Sutures intact.  No active drainage.  No significant erythema or drainage from the wound  Imaging:    I personally reviewed and interpreted the radiographs.   Assessment:   85 year old male status post left hip hemiarthroplasty complicated by infection.  I did discuss ultimately with him and his wife as well as the infectious disease team that removal of the implant would be highly morbid procedure.  I discussed that the likelihood of him being able to walk given his already current very limited mobility status would be quite low following this procedure.  He does not wish to pursue an additional a morbid procedure at this time.  I discussed that should we not be able to control the infection with IV antibiotics that we would have to unfortunately perform this as it could be possibly life-threatening.  He and his wife both understand this.  His wound today does look good on appearance.  There is no active drainage.  I am hopeful that we will be able to control the infection with antibiotic suppression at this time.  Plan :    -I will see him back in 1 week for weekly wound checks       I personally saw and evaluated the patient, and participated in the management and treatment plan.  Vanetta Mulders, MD Attending Physician, Orthopedic Surgery  This document was dictated using Dragon voice recognition software. A reasonable attempt at proof reading has been made to minimize errors.

## 2021-02-10 DIAGNOSIS — F411 Generalized anxiety disorder: Secondary | ICD-10-CM | POA: Diagnosis not present

## 2021-02-10 DIAGNOSIS — M6281 Muscle weakness (generalized): Secondary | ICD-10-CM | POA: Diagnosis not present

## 2021-02-10 DIAGNOSIS — T8452XD Infection and inflammatory reaction due to internal left hip prosthesis, subsequent encounter: Secondary | ICD-10-CM | POA: Diagnosis not present

## 2021-02-10 DIAGNOSIS — N390 Urinary tract infection, site not specified: Secondary | ICD-10-CM | POA: Diagnosis not present

## 2021-02-10 DIAGNOSIS — R4182 Altered mental status, unspecified: Secondary | ICD-10-CM | POA: Diagnosis not present

## 2021-02-10 DIAGNOSIS — S72002D Fracture of unspecified part of neck of left femur, subsequent encounter for closed fracture with routine healing: Secondary | ICD-10-CM | POA: Diagnosis not present

## 2021-02-10 DIAGNOSIS — R2681 Unsteadiness on feet: Secondary | ICD-10-CM | POA: Diagnosis not present

## 2021-02-11 DIAGNOSIS — T8452XD Infection and inflammatory reaction due to internal left hip prosthesis, subsequent encounter: Secondary | ICD-10-CM | POA: Diagnosis not present

## 2021-02-11 DIAGNOSIS — M6281 Muscle weakness (generalized): Secondary | ICD-10-CM | POA: Diagnosis not present

## 2021-02-11 DIAGNOSIS — F411 Generalized anxiety disorder: Secondary | ICD-10-CM | POA: Diagnosis not present

## 2021-02-11 DIAGNOSIS — R4182 Altered mental status, unspecified: Secondary | ICD-10-CM | POA: Diagnosis not present

## 2021-02-11 DIAGNOSIS — R2681 Unsteadiness on feet: Secondary | ICD-10-CM | POA: Diagnosis not present

## 2021-02-11 DIAGNOSIS — S72002D Fracture of unspecified part of neck of left femur, subsequent encounter for closed fracture with routine healing: Secondary | ICD-10-CM | POA: Diagnosis not present

## 2021-02-12 DIAGNOSIS — T8452XD Infection and inflammatory reaction due to internal left hip prosthesis, subsequent encounter: Secondary | ICD-10-CM | POA: Diagnosis not present

## 2021-02-12 DIAGNOSIS — G894 Chronic pain syndrome: Secondary | ICD-10-CM | POA: Diagnosis not present

## 2021-02-12 DIAGNOSIS — F4323 Adjustment disorder with mixed anxiety and depressed mood: Secondary | ICD-10-CM | POA: Diagnosis not present

## 2021-02-12 DIAGNOSIS — K5900 Constipation, unspecified: Secondary | ICD-10-CM | POA: Diagnosis not present

## 2021-02-12 DIAGNOSIS — R2681 Unsteadiness on feet: Secondary | ICD-10-CM | POA: Diagnosis not present

## 2021-02-12 DIAGNOSIS — F5101 Primary insomnia: Secondary | ICD-10-CM | POA: Diagnosis not present

## 2021-02-12 DIAGNOSIS — E1122 Type 2 diabetes mellitus with diabetic chronic kidney disease: Secondary | ICD-10-CM | POA: Diagnosis not present

## 2021-02-12 DIAGNOSIS — S72002D Fracture of unspecified part of neck of left femur, subsequent encounter for closed fracture with routine healing: Secondary | ICD-10-CM | POA: Diagnosis not present

## 2021-02-12 DIAGNOSIS — I10 Essential (primary) hypertension: Secondary | ICD-10-CM | POA: Diagnosis not present

## 2021-02-12 DIAGNOSIS — F411 Generalized anxiety disorder: Secondary | ICD-10-CM | POA: Diagnosis not present

## 2021-02-12 DIAGNOSIS — T8142XA Infection following a procedure, deep incisional surgical site, initial encounter: Secondary | ICD-10-CM | POA: Diagnosis not present

## 2021-02-12 DIAGNOSIS — M6281 Muscle weakness (generalized): Secondary | ICD-10-CM | POA: Diagnosis not present

## 2021-02-12 DIAGNOSIS — R4182 Altered mental status, unspecified: Secondary | ICD-10-CM | POA: Diagnosis not present

## 2021-02-13 DIAGNOSIS — F411 Generalized anxiety disorder: Secondary | ICD-10-CM | POA: Diagnosis not present

## 2021-02-13 DIAGNOSIS — S72002D Fracture of unspecified part of neck of left femur, subsequent encounter for closed fracture with routine healing: Secondary | ICD-10-CM | POA: Diagnosis not present

## 2021-02-13 DIAGNOSIS — T8452XD Infection and inflammatory reaction due to internal left hip prosthesis, subsequent encounter: Secondary | ICD-10-CM | POA: Diagnosis not present

## 2021-02-13 DIAGNOSIS — R2681 Unsteadiness on feet: Secondary | ICD-10-CM | POA: Diagnosis not present

## 2021-02-13 DIAGNOSIS — M6281 Muscle weakness (generalized): Secondary | ICD-10-CM | POA: Diagnosis not present

## 2021-02-13 DIAGNOSIS — R4182 Altered mental status, unspecified: Secondary | ICD-10-CM | POA: Diagnosis not present

## 2021-02-14 ENCOUNTER — Other Ambulatory Visit: Payer: Self-pay

## 2021-02-14 ENCOUNTER — Non-Acute Institutional Stay: Payer: Medicare Other | Admitting: Student

## 2021-02-14 DIAGNOSIS — F411 Generalized anxiety disorder: Secondary | ICD-10-CM | POA: Diagnosis not present

## 2021-02-14 DIAGNOSIS — S72002D Fracture of unspecified part of neck of left femur, subsequent encounter for closed fracture with routine healing: Secondary | ICD-10-CM | POA: Diagnosis not present

## 2021-02-14 DIAGNOSIS — Z515 Encounter for palliative care: Secondary | ICD-10-CM | POA: Diagnosis not present

## 2021-02-14 DIAGNOSIS — R2681 Unsteadiness on feet: Secondary | ICD-10-CM | POA: Diagnosis not present

## 2021-02-14 DIAGNOSIS — M6281 Muscle weakness (generalized): Secondary | ICD-10-CM | POA: Diagnosis not present

## 2021-02-14 DIAGNOSIS — R531 Weakness: Secondary | ICD-10-CM

## 2021-02-14 DIAGNOSIS — T8452XD Infection and inflammatory reaction due to internal left hip prosthesis, subsequent encounter: Secondary | ICD-10-CM

## 2021-02-14 DIAGNOSIS — R4182 Altered mental status, unspecified: Secondary | ICD-10-CM | POA: Diagnosis not present

## 2021-02-14 DIAGNOSIS — R52 Pain, unspecified: Secondary | ICD-10-CM

## 2021-02-14 NOTE — Progress Notes (Signed)
[facility] Palliative Care Consult Note Telephone: [phone]  Fax: [phone]   Date of encounter: 02/14/21 6:29 PM PATIENT NAME: Jason Hartman [address] [id number]   [phone] (home)  DOB: [birth year] MRN: [id number] PRIMARY CARE PROVIDER:    [name], MD,  [facility] [employer] [address] [place] [address] [phone]  REFERRING PROVIDER:   Vilinda Boehringer, NP  RESPONSIBLE PARTY:    Contact Information     Name Relation Home Work Mobile   Vecchiarelli,Kathryn Spouse [phone]  [phone]   Brantlee, Penn   [phone]   Smith,Cynthia Daughter   [phone]        I met face to face with patient in the facility. Palliative Care was asked to follow this patient by consultation request of  Vilinda Boehringer, NP to address advance care planning and complex medical decision making. This is the initial visit.   Discussed with wife via telephone.                                    ASSESSMENT AND PLAN / RECOMMENDATIONS:   Advance Care Planning/Goals of Care: Goals include to maximize quality of life and symptom management. Patient/health care surrogate gave his/her permission to discuss.Our advance care planning conversation included a discussion about:    The value and importance of advance care planning  Experiences with loved ones who have been seriously ill or have died  Exploration of personal, cultural or spiritual beliefs that might influence medical decisions  Exploration of goals of care in the event of a sudden injury or illness  Identification and preparation of a healthcare agent  Review and updating or creation of an  advance directive document . CODE STATUS: Full Code   Education provided on Palliative medicine vs. Hospice services. Patient currently receiving IV antibiotics, he is uncertain if he will return home. Wife states patient would not be able to take care of patient if he does not  improve. Patient wishes to be Full code, hospitalizations as needed.   Symptom Management/Plan:  Chronic left hip infection-patient continues on ertapenem and vancomycin; continue as directed. Patient to follow up with orthopedics and infectious disease as directed. Wound care per facility staff.  Generalized weakness-secondary to chronic left hip infection, s/p left hip hemiarthroplasty. Patient to continue therapy as directed. Monitor for falls/safety.  Pain-secondary to left hip infection. Continue tramadol as directed; routine miralax and PRN Senna-S as directed.   Follow up Palliative Care Visit: Palliative care will continue to follow for complex medical decision making, advance care planning, and clarification of goals. Return in 4 weeks or prn.  I spent 35 minutes providing this consultation. More than 50% of the time in this consultation was spent in counseling and care coordination.    PPS: 40%  HOSPICE ELIGIBILITY/DIAGNOSIS: TBD  Chief Complaint: Palliative medicine initial consult.   HISTORY OF PRESENT ILLNESS:  XAIVER Hartman is a 85 y.o. year old male  with left hip hemiarthroplasty on 9/26, osteomyelitis, prosthetic joint infection of left hip, surgical wounds with ESBL, MRSA; Parkinson's disease, T2DM, anxiety, hypertension, paroxysmal atrial fibrillation, CKD 3a.  Patient currently at [facility] and rehab. Staff report patient currently working with therapy. Patient states he had been doing well up until his second fall. He is not ambulating at this time, he is out of bed with assistance. Patient is receiving chronic suppressive  antibiotics; has PICC line to left upper extremity. He endorses fair appetite. He states his pain is currently managed with current regimen. He denies shortness of breath, constipation, nausea. No sleep difficulty reported; receives melatonin. A 10-point review of systems is negative, except for the pertinent positives and negatives detailed in the  HPI.    History obtained from review of EMR, discussion with primary team, and interview with family, facility staff/caregiver and/or Mr. Karner.  I reviewed available labs, medications, imaging, studies and related documents from the EMR.  Records reviewed and summarized above.   Physical Exam: Pulse 76, resp 16, sats 97% on room air  Constitutional: NAD General: frail appearing EYES: anicteric sclera, lids intact, no discharge  ENMT: intact hearing, oral mucous membranes moist, dentition intact CV: S1S2, RRR, no LE edema Pulmonary: LCTA, no increased work of breathing, no cough Abdomen: normo-active BS + 4 quadrants, soft and non tender GU: deferred MSK: moves all extremities, non-ambulatory Skin: warm and dry, dressing to left hip CDI. Neuro: generalized weakness, A & O X 3 Psych: non-anxious affect Hem/lymph/immuno: no widespread bruising CURRENT PROBLEM LIST:  Patient Active Problem List   Diagnosis Date Noted   MRSA infection    ESBL (extended spectrum beta-lactamase) producing bacteria infection 01/27/2021   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[place]) 01/26/2021   Hip fracture ([place]) 12/29/2020   Malnutrition of moderate degree 10/27/2020   UTI (urinary tract infection) 10/25/2020   Sepsis ([place]) 10/12/2020   AMS (altered mental status)    PAF (paroxysmal atrial fibrillation) ([place])    Acute metabolic encephalopathy 57/26/2035   Anxiety 09/09/2020   SIRS (systemic inflammatory response syndrome) ([place]) 09/09/2020   Diaphoresis    Slow transit constipation 08/07/2020   Abdominal aortic aneurysm without rupture 03/17/2020   Abnormal gait 03/17/2020   Ascending aorta dilatation ([place]) 03/17/2020   Diabetic renal disease ([place]) 03/17/2020   Recurrent UTI 07/16/2019   Hypertensive urgency 04/26/2019   Malignant HTN with heart disease, w/o CHF, w/o chronic kidney disease 04/26/2019   Elevated troponin    LFTs abnormal    Statin intolerance 01/15/2019   Penis pain 04/10/2018    Urethra cancer ([place]) 02/25/2018   Lower extremity edema 06/09/2017   Peptic ulcer disease 05/16/2017   GERD (gastroesophageal reflux disease) 05/16/2017   Chronic kidney disease, stage 3a ([place]) 04/11/2017   Lower urinary tract symptoms (LUTS) 04/15/2016   Narcotic dependence ([place]) 08/31/2015   Imbalance 08/31/2015   Diabetic peripheral neuropathy ([place]) 08/31/2015   Other malaise and fatigue 05/02/2015   Chronic fatigue 05/02/2015   Panic disorder without agoraphobia 03/22/2015   Hyperlipidemia 02/03/2014   Thrombocytopenia ([place]) 08/18/2013   Parkinsonism ([place]) 08/18/2013   Other disorders of lung 08/18/2013   Organic impotence 08/18/2013   Memory loss 08/18/2013   Low back pain 08/18/2013   Iron deficiency anemia 08/18/2013   Hypercalcemia 08/18/2013   Cellulitis of right leg 08/18/2013   Atherosclerotic heart disease of native coronary artery without angina pectoris 08/18/2013   RLS (restless legs syndrome) 09/01/2012   HERPES SIMPLEX INFECTION 11/06/2006   Type II diabetes mellitus ([place]) 11/06/2006   HYPOGONADISM 11/06/2006   VITAMIN D DEFICIENCY 11/06/2006   ANXIETY 11/06/2006   OBSTRUCTIVE SLEEP APNEA 11/06/2006   RESTLESS LEG SYNDROME 11/06/2006   Essential hypertension 11/06/2006   BENIGN PROSTATIC HYPERTROPHY 11/06/2006   ROSACEA 11/06/2006   OSTEOARTHRITIS 11/06/2006   INSOMNIA 11/06/2006   HYPERGLYCEMIA 11/06/2006   COLONIC POLYPS, HX OF 11/06/2006   PAST MEDICAL HISTORY:  Active  Ambulatory Problems    Diagnosis Date Noted   HERPES SIMPLEX INFECTION 11/06/2006   Type II diabetes mellitus ([place]) 11/06/2006   HYPOGONADISM 11/06/2006   VITAMIN D DEFICIENCY 11/06/2006   ANXIETY 11/06/2006   OBSTRUCTIVE SLEEP APNEA 11/06/2006   RESTLESS LEG SYNDROME 11/06/2006   Essential hypertension 11/06/2006   BENIGN PROSTATIC HYPERTROPHY 11/06/2006   ROSACEA 11/06/2006   OSTEOARTHRITIS 11/06/2006   INSOMNIA 11/06/2006   HYPERGLYCEMIA 11/06/2006   COLONIC POLYPS, HX OF  11/06/2006   RLS (restless legs syndrome) 09/01/2012   Thrombocytopenia ([place]) 08/18/2013   Peptic ulcer disease 05/16/2017   Parkinsonism ([place]) 08/18/2013   Panic disorder without agoraphobia 03/22/2015   Other malaise and fatigue 05/02/2015   Other disorders of lung 08/18/2013   Organic impotence 08/18/2013   Narcotic dependence ([place]) 08/31/2015   Memory loss 08/18/2013   Lower urinary tract symptoms (LUTS) 04/15/2016   Lower extremity edema 06/09/2017   Low back pain 08/18/2013   Iron deficiency anemia 08/18/2013   Imbalance 08/31/2015   Hyperlipidemia 02/03/2014   Hypercalcemia 08/18/2013   GERD (gastroesophageal reflux disease) 05/16/2017   Diabetic peripheral neuropathy ([place]) 08/31/2015   Chronic kidney disease, stage 3a ([place]) 04/11/2017   Cellulitis of right leg 08/18/2013   Atherosclerotic heart disease of native coronary artery without angina pectoris 08/18/2013   Chronic fatigue 05/02/2015   Urethra cancer ([place]) 02/25/2018   Penis pain 04/10/2018   Hypertensive urgency 04/26/2019   Malignant HTN with heart disease, w/o CHF, w/o chronic kidney disease 04/26/2019   Elevated troponin    LFTs abnormal    Recurrent UTI 07/16/2019   Statin intolerance 01/15/2019   Abdominal aortic aneurysm without rupture 03/17/2020   Abnormal gait 03/17/2020   Ascending aorta dilatation ([place]) 03/17/2020   Diabetic renal disease ([place]) 03/17/2020   Slow transit constipation 08/07/2020   Anxiety 09/09/2020   SIRS (systemic inflammatory response syndrome) ([place]) 09/09/2020   Diaphoresis    Acute metabolic encephalopathy 64/15/8309   AMS (altered mental status)    PAF (paroxysmal atrial fibrillation) ([place])    Sepsis ([place]) 10/12/2020   UTI (urinary tract infection) 10/25/2020   Malnutrition of moderate degree 10/27/2020   Hip fracture ([place]) 12/29/2020   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[place]) 01/26/2021   ESBL (extended spectrum beta-lactamase) producing bacteria infection 01/27/2021    MRSA infection    Resolved Ambulatory Problems    Diagnosis Date Noted   No Resolved Ambulatory Problems   Past Medical History:  Diagnosis Date   CAD (coronary artery disease)    Cellulitis and abscess of leg, except foot    Hypertension    Thoracic or lumbosacral neuritis or radiculitis, unspecified    Type II or unspecified type diabetes mellitus without mention of complication, not stated as uncontrolled    Unspecified disease of pericardium    Unspecified hereditary and idiopathic peripheral neuropathy    SOCIAL HX:  Social History   Tobacco Use   Smoking status: Former    Types: Cigarettes    Quit date: 09/01/1980    Years since quitting: 40.4   Smokeless tobacco: Never  Substance Use Topics   Alcohol use: Yes    Comment: rare   FAMILY HX:  Family History  Problem Relation Age of Onset   Diabetes Father       ALLERGIES:  Allergies  Allergen Reactions   Oxycodone Anaphylaxis   Iodinated Diagnostic Agents Hives    alleric to renografin,isovue & omnipaque, hives, requires 13 hr prep//a.calhoun, Onset Date: [id number]  Iodine Hives and Rash    alleric to renografin,isovue & omnipaque, hives, requires 13 hr prep//a.calhoun, Onset Date: [id number] allergic to renografin,isovue & omnipaque, hives, requires 13 hr prep//a.calhoun, Onset Date: [id number]   Linezolid Hives and Rash        Methadone Hcl Other (See Comments)    hallucinations    Promethazine Other (See Comments)    Delirium (pulled out IV), erratic behavior Mental status change     Isovue [Iopamidol] Hives   Methadone    Morphine Sulfate Other (See Comments)    Unknown reaction   Omnipaque [Iohexol] Hives    Code: HIVES, Desc: alleric to renografin,isovue & omnipaque, hives, requires 13 hr prep//a.calhoun, Onset Date: [id number]    Quetiapine Other (See Comments)    Unknown reaction   Renografin [Diatrizoate] Hives   Statins Other (See Comments)    Muscle weakness   Zolpidem Other (See  Comments)    Erratic behavior/delusions Altered mental status   Atorvastatin Itching and Other (See Comments)    Tired, weakness    Carbidopa-Levodopa Anxiety   Chlorhexidine Gluconate [Chlorhexidine] Hives and Rash   Clindamycin Rash   Colesevelam Other (See Comments)    tired    Doxazosin Rash   Lovastatin Other (See Comments)    Tired, nervousness    Rosuvastatin Other (See Comments)    Tired, weakness      PERTINENT MEDICATIONS:  Outpatient Encounter Medications as of 02/14/2021  Medication Sig   acetaminophen (TYLENOL) 500 MG tablet Take 1,000 mg by mouth every 8 (eight) hours.   Acidophilus Lactobacillus CAPS Take 1 capsule by mouth every evening.   amiodarone (PACERONE) 200 MG tablet Take 1 tablet (200 mg total) by mouth daily.   apixaban (ELIQUIS) 5 MG TABS tablet Take 1 tablet (5 mg total) by mouth 2 (two) times daily.   clonazePAM (KLONOPIN) 0.25 MG disintegrating tablet Take 1 tablet (0.25 mg total) by mouth every morning.   clonazePAM (KLONOPIN) 0.5 MG tablet Take 1 tablet (0.5 mg total) by mouth at bedtime.   ertapenem (INVANZ) IVPB Inject 1 g into the vein daily. Indication:  PJI First Dose: Yes Last Day of Therapy:  03/23/21 Labs - Once weekly:  CBC/D and BMP, Labs - Every other week:  ESR and CRP Method of administration: Mini-Bag Plus / Gravity Method of administration may be changed at the discretion of home infusion pharmacist based upon assessment of the patient and/or caregiver's ability to self-administer the medication ordered.   famotidine (PEPCID) 20 MG tablet Take 20 mg by mouth every morning.   ferrous sulfate 325 (65 FE) MG EC tablet Take 325 mg by mouth every morning.   gabapentin (NEURONTIN) 300 MG capsule Take 1 capsule (300 mg total) by mouth at bedtime.   LANTUS SOLOSTAR 100 UNIT/ML Solostar Pen Inject 8 Units into the skin 2 (two) times daily.   melatonin 5 MG TABS Take 5 mg by mouth at bedtime.   metFORMIN (GLUCOPHAGE-XR) 500 MG 24 hr tablet  Take 500 mg by mouth daily with breakfast.   methocarbamol (ROBAXIN) 500 MG tablet Take 1 tablet (500 mg total) by mouth every 6 (six) hours as needed for muscle spasms.   metoprolol tartrate (LOPRESSOR) 25 MG tablet Take 0.5 tablets (12.5 mg total) by mouth 2 (two) times daily.   nystatin (MYCOSTATIN/NYSTOP) powder Apply 1 application topically 2 (two) times daily. To rash on back, groin, and sacrum   OLANZapine (ZYPREXA) 2.5 MG tablet Take 1 tablet (2.5 mg total) by mouth at  bedtime.   polyethylene glycol powder (GLYCOLAX/MIRALAX) 17 GM/SCOOP powder Take 17 g by mouth every morning. Mix in 8 oz water and drink   Rotigotine (NEUPRO) 8 MG/24HR PT24 Place 1 patch onto the skin at bedtime. For Parkinson's   Semaglutide,0.25 or 0.5MG/DOS, (OZEMPIC, 0.25 OR 0.5 MG/DOSE,) 2 MG/1.5ML SOPN Inject 0.5 mg into the skin every Tuesday.   senna-docusate (SENOKOT-S) 8.6-50 MG tablet Take 1 tablet by mouth 2 (two) times daily as needed for moderate constipation.   thiamine 100 MG tablet Take 1 tablet (100 mg total) by mouth daily.   traMADol (ULTRAM) 50 MG tablet Take 1.5 tablets (75 mg total) by mouth 2 (two) times daily.   vancomycin IVPB Inject 1,250 mg into the vein daily. Indication:  PJI First Dose: Yes Last Day of Therapy:  03/23/21 Labs - Sunday/Monday:  CBC/D, BMP, and vancomycin trough. Labs - Thursday:  BMP and vancomycin trough Labs - Every other week:  ESR and CRP Method of administration:Elastomeric Method of administration may be changed at the discretion of the patient and/or caregiver's ability to self-administer the medication ordered.   No facility-administered encounter medications on file as of 02/14/2021.   Thank you for the opportunity to participate in the care of Mr. Katzenstein.  The palliative care team will continue to follow. Please call our office at [phone] if we can be of additional assistance.   Ezekiel Slocumb, NP ,   COVID-19 PATIENT SCREENING TOOL Asked and negative  response unless otherwise noted:  Have you had symptoms of covid, tested positive or been in contact with someone with symptoms/positive test in the past 5-10 days? No

## 2021-02-15 DIAGNOSIS — L97312 Non-pressure chronic ulcer of right ankle with fat layer exposed: Secondary | ICD-10-CM | POA: Diagnosis not present

## 2021-02-15 DIAGNOSIS — L97322 Non-pressure chronic ulcer of left ankle with fat layer exposed: Secondary | ICD-10-CM | POA: Diagnosis not present

## 2021-02-15 DIAGNOSIS — T8189XA Other complications of procedures, not elsewhere classified, initial encounter: Secondary | ICD-10-CM | POA: Diagnosis not present

## 2021-02-15 DIAGNOSIS — R4182 Altered mental status, unspecified: Secondary | ICD-10-CM | POA: Diagnosis not present

## 2021-02-15 DIAGNOSIS — S72002D Fracture of unspecified part of neck of left femur, subsequent encounter for closed fracture with routine healing: Secondary | ICD-10-CM | POA: Diagnosis not present

## 2021-02-15 DIAGNOSIS — L97522 Non-pressure chronic ulcer of other part of left foot with fat layer exposed: Secondary | ICD-10-CM | POA: Diagnosis not present

## 2021-02-15 DIAGNOSIS — Z23 Encounter for immunization: Secondary | ICD-10-CM | POA: Diagnosis not present

## 2021-02-15 DIAGNOSIS — I10 Essential (primary) hypertension: Secondary | ICD-10-CM | POA: Diagnosis not present

## 2021-02-15 DIAGNOSIS — T8452XD Infection and inflammatory reaction due to internal left hip prosthesis, subsequent encounter: Secondary | ICD-10-CM | POA: Diagnosis not present

## 2021-02-15 DIAGNOSIS — M6281 Muscle weakness (generalized): Secondary | ICD-10-CM | POA: Diagnosis not present

## 2021-02-15 DIAGNOSIS — F411 Generalized anxiety disorder: Secondary | ICD-10-CM | POA: Diagnosis not present

## 2021-02-15 DIAGNOSIS — R2681 Unsteadiness on feet: Secondary | ICD-10-CM | POA: Diagnosis not present

## 2021-02-16 ENCOUNTER — Other Ambulatory Visit: Payer: Self-pay

## 2021-02-16 ENCOUNTER — Ambulatory Visit (INDEPENDENT_AMBULATORY_CARE_PROVIDER_SITE_OTHER): Payer: Medicare Other | Admitting: Orthopaedic Surgery

## 2021-02-16 DIAGNOSIS — T8452XA Infection and inflammatory reaction due to internal left hip prosthesis, initial encounter: Secondary | ICD-10-CM

## 2021-02-16 DIAGNOSIS — M2550 Pain in unspecified joint: Secondary | ICD-10-CM | POA: Diagnosis not present

## 2021-02-16 DIAGNOSIS — T8452XD Infection and inflammatory reaction due to internal left hip prosthesis, subsequent encounter: Secondary | ICD-10-CM | POA: Diagnosis not present

## 2021-02-16 DIAGNOSIS — Z7401 Bed confinement status: Secondary | ICD-10-CM | POA: Diagnosis not present

## 2021-02-16 DIAGNOSIS — R4182 Altered mental status, unspecified: Secondary | ICD-10-CM | POA: Diagnosis not present

## 2021-02-16 DIAGNOSIS — R2681 Unsteadiness on feet: Secondary | ICD-10-CM | POA: Diagnosis not present

## 2021-02-16 DIAGNOSIS — M6281 Muscle weakness (generalized): Secondary | ICD-10-CM | POA: Diagnosis not present

## 2021-02-16 DIAGNOSIS — F411 Generalized anxiety disorder: Secondary | ICD-10-CM | POA: Diagnosis not present

## 2021-02-16 DIAGNOSIS — S72002D Fracture of unspecified part of neck of left femur, subsequent encounter for closed fracture with routine healing: Secondary | ICD-10-CM | POA: Diagnosis not present

## 2021-02-16 NOTE — Progress Notes (Signed)
Post Operative Evaluation    Procedure/Date of Surgery: Left hip hemiarthroplasty done on December 30, 2020 status post irrigation debridement January 26, 2021  Interval History:  Jason Hartman presents today for follow-up of his left prosthetic hip infection.  Per his wife there is been some drainage at the inferior aspect of the wound.  He has been getting dressing changes per wound care at his rehab.  He continues to remain minimally ambulatory.  He is scheduled to see infectious disease following Monday. PMH/PSH/Family History/Social History/Meds/Allergies:    Past Medical History:  Diagnosis Date   CAD (coronary artery disease)    s/p stents   Cellulitis and abscess of leg, except foot    Hypertension    Obstructive sleep apnea (adult) (pediatric)    Restless legs syndrome (RLS)    Thoracic or lumbosacral neuritis or radiculitis, unspecified    Type II or unspecified type diabetes mellitus without mention of complication, not stated as uncontrolled    Unspecified disease of pericardium    Unspecified hereditary and idiopathic peripheral neuropathy    Past Surgical History:  Procedure Laterality Date   ANAL FISSURE REPAIR     ANTERIOR APPROACH HEMI HIP ARTHROPLASTY Left 12/30/2020   Procedure: ANTERIOR APPROACH HIP ARTHROPLASTY;  Surgeon: Vanetta Mulders, MD;  Location: [place];  Service: Orthopedics;  Laterality: Left;   APPLICATION OF WOUND VAC Left 01/26/2021   Procedure: APPLICATION OF WOUND VAC;  Surgeon: Vanetta Mulders, MD;  Location: [place];  Service: Orthopedics;  Laterality: Left;   I & D EXTREMITY Left 01/26/2021   Procedure: IRRIGATION AND DEBRIDEMENT LEFT HIP;  Surgeon: Vanetta Mulders, MD;  Location: [place];  Service: Orthopedics;  Laterality: Left;  30 MIN   pericarditis  2009   Social History   Socioeconomic History   Marital status: Married    Spouse name: [name]   Number of children: 2   Years of education: College   Highest  education level: Not on file  Occupational History   Occupation: Marine scientist: [employer]  Tobacco Use   Smoking status: Former    Types: Cigarettes    Quit date: 09/01/1980    Years since quitting: 40.4   Smokeless tobacco: Never  Vaping Use   Vaping Use: Never used  Substance and Sexual Activity   Alcohol use: Yes    Comment: rare   Drug use: Never   Sexual activity: Not on file  Other Topics Concern   Not on file  Social History Narrative   Patient lives at home with spouse.   Caffeine Use: occasionally   Social Determinants of Health   Financial Resource Strain: Not on file  Food Insecurity: Not on file  Transportation Needs: Not on file  Physical Activity: Not on file  Stress: Not on file  Social Connections: Not on file   Family History  Problem Relation Age of Onset   Diabetes Father    Allergies  Allergen Reactions   Oxycodone Anaphylaxis   Iodinated Diagnostic Agents Hives    alleric to renografin,isovue & omnipaque, hives, requires 13 hr prep//a.calhoun, Onset Date: [id number]      Iodine Hives and Rash    alleric to renografin,isovue & omnipaque, hives, requires 13 hr prep//a.calhoun, Onset Date: [id number] allergic to renografin,isovue & omnipaque, hives, requires 13 hr prep//a.calhoun, Onset  Date: [id number]   Linezolid Hives and Rash        Methadone Hcl Other (See Comments)    hallucinations    Promethazine Other (See Comments)    Delirium (pulled out IV), erratic behavior Mental status change     Isovue [Iopamidol] Hives   Methadone    Morphine Sulfate Other (See Comments)    Unknown reaction   Omnipaque [Iohexol] Hives    Code: HIVES, Desc: alleric to renografin,isovue & omnipaque, hives, requires 13 hr prep//a.calhoun, Onset Date: [id number]    Quetiapine Other (See Comments)    Unknown reaction   Renografin [Diatrizoate] Hives   Statins Other (See Comments)    Muscle weakness   Zolpidem Other (See Comments)    Erratic  behavior/delusions Altered mental status   Atorvastatin Itching and Other (See Comments)    Tired, weakness    Carbidopa-Levodopa Anxiety   Chlorhexidine Gluconate [Chlorhexidine] Hives and Rash   Clindamycin Rash   Colesevelam Other (See Comments)    tired    Doxazosin Rash   Lovastatin Other (See Comments)    Tired, nervousness    Rosuvastatin Other (See Comments)    Tired, weakness    Current Outpatient Medications  Medication Sig Dispense Refill   acetaminophen (TYLENOL) 500 MG tablet Take 1,000 mg by mouth every 8 (eight) hours.     Acidophilus Lactobacillus CAPS Take 1 capsule by mouth every evening.     amiodarone (PACERONE) 200 MG tablet Take 1 tablet (200 mg total) by mouth daily.     apixaban (ELIQUIS) 5 MG TABS tablet Take 1 tablet (5 mg total) by mouth 2 (two) times daily. 60 tablet    clonazePAM (KLONOPIN) 0.25 MG disintegrating tablet Take 1 tablet (0.25 mg total) by mouth every morning. 30 tablet 0   clonazePAM (KLONOPIN) 0.5 MG tablet Take 1 tablet (0.5 mg total) by mouth at bedtime. 30 tablet 0   ertapenem (INVANZ) IVPB Inject 1 g into the vein daily. Indication:  PJI First Dose: Yes Last Day of Therapy:  03/23/21 Labs - Once weekly:  CBC/D and BMP, Labs - Every other week:  ESR and CRP Method of administration: Mini-Bag Plus / Gravity Method of administration may be changed at the discretion of home infusion pharmacist based upon assessment of the patient and/or caregiver's ability to self-administer the medication ordered. 50 Units 0   famotidine (PEPCID) 20 MG tablet Take 20 mg by mouth every morning.     ferrous sulfate 325 (65 FE) MG EC tablet Take 325 mg by mouth every morning.     gabapentin (NEURONTIN) 300 MG capsule Take 1 capsule (300 mg total) by mouth at bedtime.     LANTUS SOLOSTAR 100 UNIT/ML Solostar Pen Inject 8 Units into the skin 2 (two) times daily. 15 mL 11   melatonin 5 MG TABS Take 5 mg by mouth at bedtime.     metFORMIN (GLUCOPHAGE-XR)  500 MG 24 hr tablet Take 500 mg by mouth daily with breakfast.     methocarbamol (ROBAXIN) 500 MG tablet Take 1 tablet (500 mg total) by mouth every 6 (six) hours as needed for muscle spasms. 30 tablet 0   metoprolol tartrate (LOPRESSOR) 25 MG tablet Take 0.5 tablets (12.5 mg total) by mouth 2 (two) times daily.     nystatin (MYCOSTATIN/NYSTOP) powder Apply 1 application topically 2 (two) times daily. To rash on back, groin, and sacrum     OLANZapine (ZYPREXA) 2.5 MG tablet Take 1 tablet (2.5 mg total) by mouth  at bedtime. 30 tablet 0   polyethylene glycol powder (GLYCOLAX/MIRALAX) 17 GM/SCOOP powder Take 17 g by mouth every morning. Mix in 8 oz water and drink     Rotigotine (NEUPRO) 8 MG/24HR PT24 Place 1 patch onto the skin at bedtime. For Parkinson's     Semaglutide,0.25 or 0.5MG/DOS, (OZEMPIC, 0.25 OR 0.5 MG/DOSE,) 2 MG/1.5ML SOPN Inject 0.5 mg into the skin every Tuesday.     senna-docusate (SENOKOT-S) 8.6-50 MG tablet Take 1 tablet by mouth 2 (two) times daily as needed for moderate constipation.     thiamine 100 MG tablet Take 1 tablet (100 mg total) by mouth daily.     traMADol (ULTRAM) 50 MG tablet Take 1.5 tablets (75 mg total) by mouth 2 (two) times daily. 30 tablet 0   vancomycin IVPB Inject 1,250 mg into the vein daily. Indication:  PJI First Dose: Yes Last Day of Therapy:  03/23/21 Labs - Sunday/Monday:  CBC/D, BMP, and vancomycin trough. Labs - Thursday:  BMP and vancomycin trough Labs - Every other week:  ESR and CRP Method of administration:Elastomeric Method of administration may be changed at the discretion of the patient and/or caregiver's ability to self-administer the medication ordered. 50 Units 0   No current facility-administered medications for this visit.   No results found.  Review of Systems:   A ROS was performed including pertinent positives and negatives as documented in the HPI.   Musculoskeletal Exam:    There were no vitals taken for this  visit.  Left wound VAC removed today.  Sutures intact.  Small area of dehiscence at the distal aspect of the wound.  No surrounding erythema or drainage i Imaging:    I personally reviewed and interpreted the radiographs.   Assessment:   85 year old male status post left hip hemiarthroplasty complicated by infection.  Given his significant medical comorbidities he continues to experience wound healing issues even while being treated on heavy-duty IV antibiotics.  He is quite limited in his ambulatory status.  At this point treatment options include continued antibiotic suppression versus implant removal and cement spacer.  I described that there is significant morbidity associated with the placement of the antibiotic spacer.  This would almost certainly result in a very high level of pain.  I do not believe this would necessarily improve his ambulatory status if anything this would worsen as the antibiotic spacer is not as durable as the metal implant currently in.  He is currently being seen by palliative medicine at his rehab facility which I do believe is very important for him.  At this time after lengthy discussion with his wife Jason Hartman and Him, We Have Elected to perform wound VAC changes every 4 days to the left hip.  I agree with palliative consult at his rehab.  We will continue to recommend antibiotic suppression at this time.  I will also plan to call his brother Jason Hartman who is a retired physician from the emergency medicine department to discuss more Plan :    -Return to clinic in 1 week for weekly evaluation and re-discussion       I personally saw and evaluated the patient, and participated in the management and treatment plan.  Vanetta Mulders, MD Attending Physician, Orthopedic Surgery  This document was dictated using Dragon voice recognition software. A reasonable attempt at proof reading has been made to minimize errors.

## 2021-02-17 DIAGNOSIS — R0989 Other specified symptoms and signs involving the circulatory and respiratory systems: Secondary | ICD-10-CM | POA: Diagnosis not present

## 2021-02-17 DIAGNOSIS — R059 Cough, unspecified: Secondary | ICD-10-CM | POA: Diagnosis not present

## 2021-02-17 DIAGNOSIS — L03116 Cellulitis of left lower limb: Secondary | ICD-10-CM | POA: Diagnosis not present

## 2021-02-18 DIAGNOSIS — R4182 Altered mental status, unspecified: Secondary | ICD-10-CM | POA: Diagnosis not present

## 2021-02-18 DIAGNOSIS — T8452XD Infection and inflammatory reaction due to internal left hip prosthesis, subsequent encounter: Secondary | ICD-10-CM | POA: Diagnosis not present

## 2021-02-18 DIAGNOSIS — S72002D Fracture of unspecified part of neck of left femur, subsequent encounter for closed fracture with routine healing: Secondary | ICD-10-CM | POA: Diagnosis not present

## 2021-02-18 DIAGNOSIS — R2681 Unsteadiness on feet: Secondary | ICD-10-CM | POA: Diagnosis not present

## 2021-02-18 DIAGNOSIS — F411 Generalized anxiety disorder: Secondary | ICD-10-CM | POA: Diagnosis not present

## 2021-02-18 DIAGNOSIS — M6281 Muscle weakness (generalized): Secondary | ICD-10-CM | POA: Diagnosis not present

## 2021-02-19 ENCOUNTER — Ambulatory Visit (INDEPENDENT_AMBULATORY_CARE_PROVIDER_SITE_OTHER): Payer: Medicare Other | Admitting: Internal Medicine

## 2021-02-19 ENCOUNTER — Telehealth: Payer: Self-pay

## 2021-02-19 ENCOUNTER — Other Ambulatory Visit: Payer: Self-pay

## 2021-02-19 ENCOUNTER — Encounter: Payer: Self-pay | Admitting: Internal Medicine

## 2021-02-19 VITALS — BP 115/68 | HR 77 | Temp 97.4°F

## 2021-02-19 DIAGNOSIS — Z1612 Extended spectrum beta lactamase (ESBL) resistance: Secondary | ICD-10-CM | POA: Diagnosis not present

## 2021-02-19 DIAGNOSIS — A4902 Methicillin resistant Staphylococcus aureus infection, unspecified site: Secondary | ICD-10-CM | POA: Diagnosis not present

## 2021-02-19 DIAGNOSIS — F4323 Adjustment disorder with mixed anxiety and depressed mood: Secondary | ICD-10-CM | POA: Diagnosis not present

## 2021-02-19 DIAGNOSIS — N390 Urinary tract infection, site not specified: Secondary | ICD-10-CM | POA: Diagnosis not present

## 2021-02-19 DIAGNOSIS — M6281 Muscle weakness (generalized): Secondary | ICD-10-CM | POA: Diagnosis not present

## 2021-02-19 DIAGNOSIS — A499 Bacterial infection, unspecified: Secondary | ICD-10-CM | POA: Diagnosis not present

## 2021-02-19 DIAGNOSIS — S72002D Fracture of unspecified part of neck of left femur, subsequent encounter for closed fracture with routine healing: Secondary | ICD-10-CM | POA: Diagnosis not present

## 2021-02-19 DIAGNOSIS — T8452XD Infection and inflammatory reaction due to internal left hip prosthesis, subsequent encounter: Secondary | ICD-10-CM

## 2021-02-19 DIAGNOSIS — F411 Generalized anxiety disorder: Secondary | ICD-10-CM | POA: Diagnosis not present

## 2021-02-19 DIAGNOSIS — R4182 Altered mental status, unspecified: Secondary | ICD-10-CM | POA: Diagnosis not present

## 2021-02-19 DIAGNOSIS — R2681 Unsteadiness on feet: Secondary | ICD-10-CM | POA: Diagnosis not present

## 2021-02-19 NOTE — Patient Instructions (Addendum)
Thank you for coming to see me today. It was a pleasure seeing you.  To Do: Continue Vancomycin and Ertapenem as ordered from the hospital via PICC line through 03/23/21 Then we will attempt to transition to an oral antibiotic for your infection Follow up in 4 weeks Please have labs faxed to [place] at [phone]  If you have any questions or concerns, please do not hesitate to call the office at [phone].  Take Care,   Jule Ser

## 2021-02-19 NOTE — Telephone Encounter (Addendum)
I attempted to call [facility] to give verbal orders for the patient. I left a voicemail for nursing to call back.   Per Dr. Juleen China patient is to continue IV antibiotics (Vancomycin and Ertapenem) through 03/23/21. Picc can be pulled after last dose. Patient also scheduled for follow up with Dr. Juleen China on 03/16/21 at 3:30 pm. We will also need labs faxed to our office for the patient. We currently have no updated labs on the patient.  Petro Talent T Brooks Sailors

## 2021-02-19 NOTE — Progress Notes (Signed)
[place] for Infectious Disease  CHIEF COMPLAINT:    Follow up for prosthetic hip infection  SUBJECTIVE:    Jason Hartman is a 85 y.o. male with PMHx as below who presents to the clinic for left hip prosthetic joint infection.   Patient was recently hospitalized at [facility] from 10/21 to 02/01/2021 after he presented with a prosthetic joint infection/osteomyelitis of the left hip.  He had a wound that developed after sustaining a fall at his skilled nursing facility that tracked down below the fascia and to the bone as well as his recent hemiarthroplasty.  He underwent I&D 01/26/2021 with orthopedic surgery and was deemed not to be a candidate for any type of resection arthroplasty.  Unfortunately, his OR cultures grew ESBL Klebsiella pneumonia (no conventional oral options) and MRSA.  He was discharged on vancomycin and ertapenem through 03/23/21 (~8 weeks of treatment).  He has been following with his surgeon since discharge and saw him on 02/16/21.  He continues to have issues with wound healing still but the plan is continued antibiotics, wound vac changes, wound care, and involvement of palliative care for assistance managing his symptoms.  No fevers, chills, but does have continued hip pain.  Tolerating antibiotics and no issues with PICC.  Please see A&P for the details of today's visit and status of the patient's medical problems.   Patient's Medications  New Prescriptions   No medications on file  Previous Medications   ACETAMINOPHEN (TYLENOL) 500 MG TABLET    Take 1,000 mg by mouth every 8 (eight) hours.   ACIDOPHILUS LACTOBACILLUS CAPS    Take 1 capsule by mouth every evening.   AMIODARONE (PACERONE) 200 MG TABLET    Take 1 tablet (200 mg total) by mouth daily.   APIXABAN (ELIQUIS) 5 MG TABS TABLET    Take 1 tablet (5 mg total) by mouth 2 (two) times daily.   CLONAZEPAM (KLONOPIN) 0.25 MG DISINTEGRATING TABLET    Take 1 tablet (0.25 mg total) by mouth every  morning.   CLONAZEPAM (KLONOPIN) 0.5 MG TABLET    Take 1 tablet (0.5 mg total) by mouth at bedtime.   ERTAPENEM (INVANZ) IVPB    Inject 1 g into the vein daily. Indication:  PJI First Dose: Yes Last Day of Therapy:  03/23/21 Labs - Once weekly:  CBC/D and BMP, Labs - Every other week:  ESR and CRP Method of administration: Mini-Bag Plus / Gravity Method of administration may be changed at the discretion of home infusion pharmacist based upon assessment of the patient and/or caregiver's ability to self-administer the medication ordered.   FAMOTIDINE (PEPCID) 20 MG TABLET    Take 20 mg by mouth every morning.   FERROUS SULFATE 325 (65 FE) MG EC TABLET    Take 325 mg by mouth every morning.   GABAPENTIN (NEURONTIN) 300 MG CAPSULE    Take 1 capsule (300 mg total) by mouth at bedtime.   LANTUS SOLOSTAR 100 UNIT/ML SOLOSTAR PEN    Inject 8 Units into the skin 2 (two) times daily.   MELATONIN 5 MG TABS    Take 5 mg by mouth at bedtime.   METFORMIN (GLUCOPHAGE-XR) 500 MG 24 HR TABLET    Take 500 mg by mouth daily with breakfast.   METHOCARBAMOL (ROBAXIN) 500 MG TABLET    Take 1 tablet (500 mg total) by mouth every 6 (six) hours as needed for muscle spasms.   METOPROLOL TARTRATE (LOPRESSOR) 25 MG  TABLET    Take 0.5 tablets (12.5 mg total) by mouth 2 (two) times daily.   NYSTATIN (MYCOSTATIN/NYSTOP) POWDER    Apply 1 application topically 2 (two) times daily. To rash on back, groin, and sacrum   OLANZAPINE (ZYPREXA) 2.5 MG TABLET    Take 1 tablet (2.5 mg total) by mouth at bedtime.   POLYETHYLENE GLYCOL POWDER (GLYCOLAX/MIRALAX) 17 GM/SCOOP POWDER    Take 17 g by mouth every morning. Mix in 8 oz water and drink   ROTIGOTINE (NEUPRO) 8 MG/24HR PT24    Place 1 patch onto the skin at bedtime. For Parkinson's   SEMAGLUTIDE,0.25 OR 0.5MG/DOS, (OZEMPIC, 0.25 OR 0.5 MG/DOSE,) 2 MG/1.5ML SOPN    Inject 0.5 mg into the skin every Tuesday.   SENNA-DOCUSATE (SENOKOT-S) 8.6-50 MG TABLET    Take 1 tablet by mouth 2  (two) times daily as needed for moderate constipation.   THIAMINE 100 MG TABLET    Take 1 tablet (100 mg total) by mouth daily.   TRAMADOL (ULTRAM) 50 MG TABLET    Take 1.5 tablets (75 mg total) by mouth 2 (two) times daily.   VANCOMYCIN IVPB    Inject 1,250 mg into the vein daily. Indication:  PJI First Dose: Yes Last Day of Therapy:  03/23/21 Labs - Sunday/Monday:  CBC/D, BMP, and vancomycin trough. Labs - Thursday:  BMP and vancomycin trough Labs - Every other week:  ESR and CRP Method of administration:Elastomeric Method of administration may be changed at the discretion of the patient and/or caregiver's ability to self-administer the medication ordered.  Modified Medications   No medications on file  Discontinued Medications   No medications on file      Past Medical History:  Diagnosis Date   CAD (coronary artery disease)    s/p stents   Cellulitis and abscess of leg, except foot    Hypertension    Obstructive sleep apnea (adult) (pediatric)    Restless legs syndrome (RLS)    Thoracic or lumbosacral neuritis or radiculitis, unspecified    Type II or unspecified type diabetes mellitus without mention of complication, not stated as uncontrolled    Unspecified disease of pericardium    Unspecified hereditary and idiopathic peripheral neuropathy     Social History   Tobacco Use   Smoking status: Former    Types: Cigarettes    Quit date: 09/01/1980    Years since quitting: 40.4   Smokeless tobacco: Never  Vaping Use   Vaping Use: Never used  Substance Use Topics   Alcohol use: Not Currently    Comment: rare   Drug use: Never    Family History  Problem Relation Age of Onset   Diabetes Father     Allergies  Allergen Reactions   Oxycodone Anaphylaxis   Iodinated Diagnostic Agents Hives    alleric to renografin,isovue & omnipaque, hives, requires 13 hr prep//a.calhoun, Onset Date: [id number]      Iodine Hives and Rash    alleric to renografin,isovue & omnipaque,  hives, requires 13 hr prep//a.calhoun, Onset Date: [id number] allergic to renografin,isovue & omnipaque, hives, requires 13 hr prep//a.calhoun, Onset Date: 32440102   Linezolid Hives and Rash        Methadone Hcl Other (See Comments)    hallucinations    Promethazine Other (See Comments)    Delirium (pulled out IV), erratic behavior Mental status change     Isovue [Iopamidol] Hives   Methadone    Morphine Sulfate Other (See Comments)    Unknown reaction  Omnipaque [Iohexol] Hives    Code: HIVES, Desc: alleric to renografin,isovue & omnipaque, hives, requires 13 hr prep//a.calhoun, Onset Date: [id number]    Quetiapine Other (See Comments)    Unknown reaction   Renografin [Diatrizoate] Hives   Statins Other (See Comments)    Muscle weakness   Zolpidem Other (See Comments)    Erratic behavior/delusions Altered mental status   Atorvastatin Itching and Other (See Comments)    Tired, weakness    Carbidopa-Levodopa Anxiety   Chlorhexidine Gluconate [Chlorhexidine] Hives and Rash   Clindamycin Rash   Colesevelam Other (See Comments)    tired    Doxazosin Rash   Lovastatin Other (See Comments)    Tired, nervousness    Rosuvastatin Other (See Comments)    Tired, weakness     Review of Systems  All other systems reviewed and are negative. Except as noted in HPI.    OBJECTIVE:    Vitals:   02/19/21 1414  BP: 115/68  Pulse: 77  Temp: (!) 97.4 F (36.3 C)  TempSrc: Oral   There is no height or weight on file to calculate BMI.  Physical Exam Constitutional:      Comments: Chronically ill appearing man, sitting in wheel chair, NAD.   Pulmonary:     Effort: Pulmonary effort is normal. No respiratory distress.  Musculoskeletal:     Comments: UE PICC in place.   Skin:    General: Skin is warm and dry.  Neurological:     General: No focal deficit present.     Mental Status: He is oriented to person, place, and time.  Psychiatric:        Mood and Affect: Mood  normal.        Behavior: Behavior normal.     Labs and Microbiology: CBC Latest Ref Rng & Units 01/29/2021 01/27/2021 01/11/2021  WBC 4.0 - 10.5 K/uL 8.2 11.3(H) 22.6(H)  Hemoglobin 13.0 - 17.0 g/dL 8.5(L) 8.5(L) 10.1(L)  Hematocrit 39.0 - 52.0 % 27.9(L) 27.8(L) 32.7(L)  Platelets 150 - 400 K/uL 260 305 477(H)   CMP Latest Ref Rng & Units 01/29/2021 01/27/2021 01/26/2021  Glucose 70 - 99 mg/dL 163(H) 215(H) 220(H)  BUN 8 - 23 mg/dL 15 22 20   Creatinine 0.61 - 1.24 mg/dL 1.03 0.99 1.07  Sodium 135 - 145 mmol/L 137 132(L) 129(L)  Potassium 3.5 - 5.1 mmol/L 3.8 5.7(H) 5.1  Chloride 98 - 111 mmol/L 102 100 98  CO2 22 - 32 mmol/L 27 24 21(L)  Calcium 8.9 - 10.3 mg/dL 8.2(L) 8.5(L) 8.4(L)  Total Protein 6.5 - 8.1 g/dL - - -  Total Bilirubin 0.3 - 1.2 mg/dL - - -  Alkaline Phos 38 - 126 U/L - - -  AST 15 - 41 U/L - - -  ALT 0 - 44 U/L - - -      ASSESSMENT & PLAN:    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HCC) Unfortunately, a very complicated situation with MDR organisms from his recent OR cultures and an inability to remove infected hardware.  There are no readily available oral options to treat the ESBL organism that grew but we will work on acquiring omadacycline at the appropriate time as a potential option.  For now continue with vancomycin and ertapenem 1 gm daily through 03/23/21 (8 weeks of coverage) for ESBL Kleb pneumonia and MRSA coverage.  Continue follow up with surgery and palliative care.  RTC in ~4 weeks.  Continue routine lab monitoring at SNF and have asked for  labs to be faxed to [place].      [facility] 02/19/2021, 2:45 PM   I spent 40 minutes dedicated to the care of this patient on the date of this encounter to include pre-visit review of records, face-to-face time with the patient discussing left hip PJI, ESBL, MRSA, and post-visit ordering of testing.

## 2021-02-19 NOTE — Assessment & Plan Note (Signed)
Unfortunately, a very complicated situation with MDR organisms from his recent OR cultures and an inability to remove infected hardware.  There are no readily available oral options to treat the ESBL organism that grew but we will work on acquiring omadacycline at the appropriate time as a potential option.  For now continue with vancomycin and ertapenem 1 gm daily through 03/23/21 (8 weeks of coverage) for ESBL Kleb pneumonia and MRSA coverage.  Continue follow up with surgery and palliative care.  RTC in ~4 weeks.  Continue routine lab monitoring at SNF and have asked for labs to be faxed to [place].

## 2021-02-20 DIAGNOSIS — K5900 Constipation, unspecified: Secondary | ICD-10-CM | POA: Diagnosis not present

## 2021-02-20 DIAGNOSIS — G20 Parkinson's disease: Secondary | ICD-10-CM | POA: Diagnosis not present

## 2021-02-20 DIAGNOSIS — R051 Acute cough: Secondary | ICD-10-CM | POA: Diagnosis not present

## 2021-02-20 DIAGNOSIS — F411 Generalized anxiety disorder: Secondary | ICD-10-CM | POA: Diagnosis not present

## 2021-02-20 DIAGNOSIS — T8142XA Infection following a procedure, deep incisional surgical site, initial encounter: Secondary | ICD-10-CM | POA: Diagnosis not present

## 2021-02-20 DIAGNOSIS — E1122 Type 2 diabetes mellitus with diabetic chronic kidney disease: Secondary | ICD-10-CM | POA: Diagnosis not present

## 2021-02-20 DIAGNOSIS — M6281 Muscle weakness (generalized): Secondary | ICD-10-CM | POA: Diagnosis not present

## 2021-02-20 DIAGNOSIS — D509 Iron deficiency anemia, unspecified: Secondary | ICD-10-CM | POA: Diagnosis not present

## 2021-02-20 DIAGNOSIS — I480 Paroxysmal atrial fibrillation: Secondary | ICD-10-CM | POA: Diagnosis not present

## 2021-02-20 DIAGNOSIS — I10 Essential (primary) hypertension: Secondary | ICD-10-CM | POA: Diagnosis not present

## 2021-02-20 DIAGNOSIS — S72002D Fracture of unspecified part of neck of left femur, subsequent encounter for closed fracture with routine healing: Secondary | ICD-10-CM | POA: Diagnosis not present

## 2021-02-20 DIAGNOSIS — R4182 Altered mental status, unspecified: Secondary | ICD-10-CM | POA: Diagnosis not present

## 2021-02-20 DIAGNOSIS — T8452XD Infection and inflammatory reaction due to internal left hip prosthesis, subsequent encounter: Secondary | ICD-10-CM | POA: Diagnosis not present

## 2021-02-20 DIAGNOSIS — R2681 Unsteadiness on feet: Secondary | ICD-10-CM | POA: Diagnosis not present

## 2021-02-20 DIAGNOSIS — G894 Chronic pain syndrome: Secondary | ICD-10-CM | POA: Diagnosis not present

## 2021-02-20 DIAGNOSIS — N1831 Chronic kidney disease, stage 3a: Secondary | ICD-10-CM | POA: Diagnosis not present

## 2021-02-20 DIAGNOSIS — F419 Anxiety disorder, unspecified: Secondary | ICD-10-CM | POA: Diagnosis not present

## 2021-02-20 DIAGNOSIS — R319 Hematuria, unspecified: Secondary | ICD-10-CM | POA: Diagnosis not present

## 2021-02-20 NOTE — Telephone Encounter (Signed)
RN called [facility] and asked to speak to patient's nurse to relay orders, call was dropped twice.   Beryle Flock, RN

## 2021-02-21 DIAGNOSIS — F411 Generalized anxiety disorder: Secondary | ICD-10-CM | POA: Diagnosis not present

## 2021-02-21 DIAGNOSIS — R2681 Unsteadiness on feet: Secondary | ICD-10-CM | POA: Diagnosis not present

## 2021-02-21 DIAGNOSIS — S72002D Fracture of unspecified part of neck of left femur, subsequent encounter for closed fracture with routine healing: Secondary | ICD-10-CM | POA: Diagnosis not present

## 2021-02-21 DIAGNOSIS — R4182 Altered mental status, unspecified: Secondary | ICD-10-CM | POA: Diagnosis not present

## 2021-02-21 DIAGNOSIS — T8452XD Infection and inflammatory reaction due to internal left hip prosthesis, subsequent encounter: Secondary | ICD-10-CM | POA: Diagnosis not present

## 2021-02-21 DIAGNOSIS — M6281 Muscle weakness (generalized): Secondary | ICD-10-CM | POA: Diagnosis not present

## 2021-02-22 DIAGNOSIS — L97322 Non-pressure chronic ulcer of left ankle with fat layer exposed: Secondary | ICD-10-CM | POA: Diagnosis not present

## 2021-02-22 DIAGNOSIS — R2681 Unsteadiness on feet: Secondary | ICD-10-CM | POA: Diagnosis not present

## 2021-02-22 DIAGNOSIS — T8189XA Other complications of procedures, not elsewhere classified, initial encounter: Secondary | ICD-10-CM | POA: Diagnosis not present

## 2021-02-22 DIAGNOSIS — R4182 Altered mental status, unspecified: Secondary | ICD-10-CM | POA: Diagnosis not present

## 2021-02-22 DIAGNOSIS — I10 Essential (primary) hypertension: Secondary | ICD-10-CM | POA: Diagnosis not present

## 2021-02-22 DIAGNOSIS — F411 Generalized anxiety disorder: Secondary | ICD-10-CM | POA: Diagnosis not present

## 2021-02-22 DIAGNOSIS — L97522 Non-pressure chronic ulcer of other part of left foot with fat layer exposed: Secondary | ICD-10-CM | POA: Diagnosis not present

## 2021-02-22 DIAGNOSIS — T8452XD Infection and inflammatory reaction due to internal left hip prosthesis, subsequent encounter: Secondary | ICD-10-CM | POA: Diagnosis not present

## 2021-02-22 DIAGNOSIS — M6281 Muscle weakness (generalized): Secondary | ICD-10-CM | POA: Diagnosis not present

## 2021-02-22 DIAGNOSIS — L97312 Non-pressure chronic ulcer of right ankle with fat layer exposed: Secondary | ICD-10-CM | POA: Diagnosis not present

## 2021-02-22 DIAGNOSIS — S72002D Fracture of unspecified part of neck of left femur, subsequent encounter for closed fracture with routine healing: Secondary | ICD-10-CM | POA: Diagnosis not present

## 2021-02-23 ENCOUNTER — Encounter (HOSPITAL_BASED_OUTPATIENT_CLINIC_OR_DEPARTMENT_OTHER): Payer: Medicare Other | Admitting: Orthopaedic Surgery

## 2021-02-23 DIAGNOSIS — M6281 Muscle weakness (generalized): Secondary | ICD-10-CM | POA: Diagnosis not present

## 2021-02-23 DIAGNOSIS — S72002D Fracture of unspecified part of neck of left femur, subsequent encounter for closed fracture with routine healing: Secondary | ICD-10-CM | POA: Diagnosis not present

## 2021-02-23 DIAGNOSIS — R2681 Unsteadiness on feet: Secondary | ICD-10-CM | POA: Diagnosis not present

## 2021-02-23 DIAGNOSIS — T8452XD Infection and inflammatory reaction due to internal left hip prosthesis, subsequent encounter: Secondary | ICD-10-CM | POA: Diagnosis not present

## 2021-02-23 DIAGNOSIS — R4182 Altered mental status, unspecified: Secondary | ICD-10-CM | POA: Diagnosis not present

## 2021-02-23 DIAGNOSIS — F411 Generalized anxiety disorder: Secondary | ICD-10-CM | POA: Diagnosis not present

## 2021-02-23 NOTE — Telephone Encounter (Signed)
Left voicemail for [facility] unit manager requesting call back regarding orders.    Beryle Flock, RN

## 2021-02-23 NOTE — Telephone Encounter (Signed)
[facility], call was dropped again.   Beryle Flock, RN

## 2021-02-23 NOTE — Telephone Encounter (Signed)
Have been unsuccessful in reaching nursing staff at [facility]. RN faxed orders per Dr. Juleen China to the facility at [phone].  Beryle Flock, RN

## 2021-02-25 DIAGNOSIS — S72002D Fracture of unspecified part of neck of left femur, subsequent encounter for closed fracture with routine healing: Secondary | ICD-10-CM | POA: Diagnosis not present

## 2021-02-25 DIAGNOSIS — M6281 Muscle weakness (generalized): Secondary | ICD-10-CM | POA: Diagnosis not present

## 2021-02-25 DIAGNOSIS — R4182 Altered mental status, unspecified: Secondary | ICD-10-CM | POA: Diagnosis not present

## 2021-02-25 DIAGNOSIS — T8452XD Infection and inflammatory reaction due to internal left hip prosthesis, subsequent encounter: Secondary | ICD-10-CM | POA: Diagnosis not present

## 2021-02-25 DIAGNOSIS — R2681 Unsteadiness on feet: Secondary | ICD-10-CM | POA: Diagnosis not present

## 2021-02-25 DIAGNOSIS — F411 Generalized anxiety disorder: Secondary | ICD-10-CM | POA: Diagnosis not present

## 2021-02-26 DIAGNOSIS — L03116 Cellulitis of left lower limb: Secondary | ICD-10-CM | POA: Diagnosis not present

## 2021-02-26 DIAGNOSIS — T8452XD Infection and inflammatory reaction due to internal left hip prosthesis, subsequent encounter: Secondary | ICD-10-CM | POA: Diagnosis not present

## 2021-02-26 DIAGNOSIS — R2681 Unsteadiness on feet: Secondary | ICD-10-CM | POA: Diagnosis not present

## 2021-02-26 DIAGNOSIS — S72002D Fracture of unspecified part of neck of left femur, subsequent encounter for closed fracture with routine healing: Secondary | ICD-10-CM | POA: Diagnosis not present

## 2021-02-26 DIAGNOSIS — R4182 Altered mental status, unspecified: Secondary | ICD-10-CM | POA: Diagnosis not present

## 2021-02-26 DIAGNOSIS — M6281 Muscle weakness (generalized): Secondary | ICD-10-CM | POA: Diagnosis not present

## 2021-02-26 DIAGNOSIS — F5101 Primary insomnia: Secondary | ICD-10-CM | POA: Diagnosis not present

## 2021-02-26 DIAGNOSIS — F411 Generalized anxiety disorder: Secondary | ICD-10-CM | POA: Diagnosis not present

## 2021-02-26 DIAGNOSIS — F4323 Adjustment disorder with mixed anxiety and depressed mood: Secondary | ICD-10-CM | POA: Diagnosis not present

## 2021-02-27 ENCOUNTER — Other Ambulatory Visit: Payer: Self-pay

## 2021-02-27 ENCOUNTER — Ambulatory Visit (INDEPENDENT_AMBULATORY_CARE_PROVIDER_SITE_OTHER): Payer: Medicare Other | Admitting: Orthopaedic Surgery

## 2021-02-27 DIAGNOSIS — M6281 Muscle weakness (generalized): Secondary | ICD-10-CM | POA: Diagnosis not present

## 2021-02-27 DIAGNOSIS — R4182 Altered mental status, unspecified: Secondary | ICD-10-CM | POA: Diagnosis not present

## 2021-02-27 DIAGNOSIS — F411 Generalized anxiety disorder: Secondary | ICD-10-CM | POA: Diagnosis not present

## 2021-02-27 DIAGNOSIS — S72002D Fracture of unspecified part of neck of left femur, subsequent encounter for closed fracture with routine healing: Secondary | ICD-10-CM | POA: Diagnosis not present

## 2021-02-27 DIAGNOSIS — R2681 Unsteadiness on feet: Secondary | ICD-10-CM | POA: Diagnosis not present

## 2021-02-27 DIAGNOSIS — T8452XD Infection and inflammatory reaction due to internal left hip prosthesis, subsequent encounter: Secondary | ICD-10-CM | POA: Diagnosis not present

## 2021-02-27 DIAGNOSIS — T8452XA Infection and inflammatory reaction due to internal left hip prosthesis, initial encounter: Secondary | ICD-10-CM

## 2021-02-27 LAB — FUNGUS CULTURE WITH STAIN

## 2021-02-27 LAB — FUNGAL ORGANISM REFLEX

## 2021-02-27 LAB — FUNGUS CULTURE RESULT

## 2021-02-27 NOTE — Progress Notes (Signed)
Post Operative Evaluation    Procedure/Date of Surgery: Left hip hemiarthroplasty done on December 30, 2020 status post irrigation debridement January 26, 2021  Interval History:  Come presents today for 2-week follow-up on his left periprosthetic hip infection.  Since that time there is been progressive discussion with him his wife Juliann Pulse as well as his brother Herbie Baltimore.  We have all decided on a plan going forward of chronic antibiotic suppression as we do not believe that he would necessarily be healthy for another revision surgery and removal of spacer.  He has subsequently had a wound VAC placed on the left hip by his nursing home.  This has been changed every 4 days.   PMH/PSH/Family History/Social History/Meds/Allergies:    Past Medical History:  Diagnosis Date   CAD (coronary artery disease)    s/p stents   Cellulitis and abscess of leg, except foot    Hypertension    Obstructive sleep apnea (adult) (pediatric)    Restless legs syndrome (RLS)    Thoracic or lumbosacral neuritis or radiculitis, unspecified    Type II or unspecified type diabetes mellitus without mention of complication, not stated as uncontrolled    Unspecified disease of pericardium    Unspecified hereditary and idiopathic peripheral neuropathy    Past Surgical History:  Procedure Laterality Date   ANAL FISSURE REPAIR     ANTERIOR APPROACH HEMI HIP ARTHROPLASTY Left 12/30/2020   Procedure: ANTERIOR APPROACH HIP ARTHROPLASTY;  Surgeon: Vanetta Mulders, MD;  Location: [place];  Service: Orthopedics;  Laterality: Left;   APPLICATION OF WOUND VAC Left 01/26/2021   Procedure: APPLICATION OF WOUND VAC;  Surgeon: Vanetta Mulders, MD;  Location: [place];  Service: Orthopedics;  Laterality: Left;   I & D EXTREMITY Left 01/26/2021   Procedure: IRRIGATION AND DEBRIDEMENT LEFT HIP;  Surgeon: Vanetta Mulders, MD;  Location: [place];  Service: Orthopedics;  Laterality: Left;  30 MIN    pericarditis  2009   Social History   Socioeconomic History   Marital status: Married    Spouse name: [name]   Number of children: 2   Years of education: College   Highest education level: Not on file  Occupational History   Occupation: Marine scientist: [employer]  Tobacco Use   Smoking status: Former    Types: Cigarettes    Quit date: 09/01/1980    Years since quitting: 40.5   Smokeless tobacco: Never  Vaping Use   Vaping Use: Never used  Substance and Sexual Activity   Alcohol use: Not Currently    Comment: rare   Drug use: Never   Sexual activity: Not on file  Other Topics Concern   Not on file  Social History Narrative   Patient lives at home with spouse.   Caffeine Use: occasionally   Social Determinants of Health   Financial Resource Strain: Not on file  Food Insecurity: Not on file  Transportation Needs: Not on file  Physical Activity: Not on file  Stress: Not on file  Social Connections: Not on file   Family History  Problem Relation Age of Onset   Diabetes Father    Allergies  Allergen Reactions   Oxycodone Anaphylaxis   Iodinated Diagnostic Agents Hives    alleric to renografin,isovue & omnipaque, hives, requires 13 hr prep//[email or web address], Onset Date: [id number]  Iodine Hives and Rash    alleric to renografin,isovue & omnipaque, hives, requires 13 hr prep//[email or web address], Onset Date: [id number] allergic to renografin,isovue & omnipaque, hives, requires 13 hr prep//[email or web address], Onset Date: [id number]   Linezolid Hives and Rash        Methadone Hcl Other (See Comments)    hallucinations    Promethazine Other (See Comments)    Delirium (pulled out IV), erratic behavior Mental status change     Isovue [Iopamidol] Hives   Methadone    Morphine Sulfate Other (See Comments)    Unknown reaction   Omnipaque [Iohexol] Hives    Code: HIVES, Desc: alleric to renografin,isovue & omnipaque, hives, requires 13 hr prep//[email or web address], Onset Date:  [id number]    Quetiapine Other (See Comments)    Unknown reaction   Renografin [Diatrizoate] Hives   Statins Other (See Comments)    Muscle weakness   Zolpidem Other (See Comments)    Erratic behavior/delusions Altered mental status   Atorvastatin Itching and Other (See Comments)    Tired, weakness    Carbidopa-Levodopa Anxiety   Chlorhexidine Gluconate [Chlorhexidine] Hives and Rash   Clindamycin Rash   Colesevelam Other (See Comments)    tired    Doxazosin Rash   Lovastatin Other (See Comments)    Tired, nervousness    Rosuvastatin Other (See Comments)    Tired, weakness    Current Outpatient Medications  Medication Sig Dispense Refill   acetaminophen (TYLENOL) 500 MG tablet Take 1,000 mg by mouth every 8 (eight) hours.     Acidophilus Lactobacillus CAPS Take 1 capsule by mouth every evening.     amiodarone (PACERONE) 200 MG tablet Take 1 tablet (200 mg total) by mouth daily.     apixaban (ELIQUIS) 5 MG TABS tablet Take 1 tablet (5 mg total) by mouth 2 (two) times daily. 60 tablet    clonazePAM (KLONOPIN) 0.25 MG disintegrating tablet Take 1 tablet (0.25 mg total) by mouth every morning. 30 tablet 0   clonazePAM (KLONOPIN) 0.5 MG tablet Take 1 tablet (0.5 mg total) by mouth at bedtime. 30 tablet 0   ertapenem (INVANZ) IVPB Inject 1 g into the vein daily. Indication:  PJI First Dose: Yes Last Day of Therapy:  03/23/21 Labs - Once weekly:  CBC/D and BMP, Labs - Every other week:  ESR and CRP Method of administration: Mini-Bag Plus / Gravity Method of administration may be changed at the discretion of home infusion pharmacist based upon assessment of the patient and/or caregiver's ability to self-administer the medication ordered. 50 Units 0   famotidine (PEPCID) 20 MG tablet Take 20 mg by mouth every morning.     ferrous sulfate 325 (65 FE) MG EC tablet Take 325 mg by mouth every morning.     gabapentin (NEURONTIN) 300 MG capsule Take 1 capsule (300 mg total) by mouth at  bedtime.     LANTUS SOLOSTAR 100 UNIT/ML Solostar Pen Inject 8 Units into the skin 2 (two) times daily. 15 mL 11   melatonin 5 MG TABS Take 5 mg by mouth at bedtime.     metFORMIN (GLUCOPHAGE-XR) 500 MG 24 hr tablet Take 500 mg by mouth daily with breakfast.     methocarbamol (ROBAXIN) 500 MG tablet Take 1 tablet (500 mg total) by mouth every 6 (six) hours as needed for muscle spasms. 30 tablet 0   metoprolol tartrate (LOPRESSOR) 25 MG tablet Take 0.5 tablets (12.5 mg total) by mouth 2 (two) times daily.     nystatin (MYCOSTATIN/NYSTOP) powder Apply  1 application topically 2 (two) times daily. To rash on back, groin, and sacrum     OLANZapine (ZYPREXA) 2.5 MG tablet Take 1 tablet (2.5 mg total) by mouth at bedtime. 30 tablet 0   polyethylene glycol powder (GLYCOLAX/MIRALAX) 17 GM/SCOOP powder Take 17 g by mouth every morning. Mix in 8 oz water and drink     Rotigotine (NEUPRO) 8 MG/24HR PT24 Place 1 patch onto the skin at bedtime. For Parkinson's     Semaglutide,0.25 or 0.5MG/DOS, (OZEMPIC, 0.25 OR 0.5 MG/DOSE,) 2 MG/1.5ML SOPN Inject 0.5 mg into the skin every Tuesday.     senna-docusate (SENOKOT-S) 8.6-50 MG tablet Take 1 tablet by mouth 2 (two) times daily as needed for moderate constipation.     thiamine 100 MG tablet Take 1 tablet (100 mg total) by mouth daily.     traMADol (ULTRAM) 50 MG tablet Take 1.5 tablets (75 mg total) by mouth 2 (two) times daily. 30 tablet 0   vancomycin IVPB Inject 1,250 mg into the vein daily. Indication:  PJI First Dose: Yes Last Day of Therapy:  03/23/21 Labs - Sunday/Monday:  CBC/D, BMP, and vancomycin trough. Labs - Thursday:  BMP and vancomycin trough Labs - Every other week:  ESR and CRP Method of administration:Elastomeric Method of administration may be changed at the discretion of the patient and/or caregiver's ability to self-administer the medication ordered. 50 Units 0   No current facility-administered medications for this visit.   No results  found.  Review of Systems:   A ROS was performed including pertinent positives and negatives as documented in the HPI.   Musculoskeletal Exam:    There were no vitals taken for this visit.  Left wound VAC in place.  Remainder of extremities distal neurovascular intact.  He is sitting in a stretcher. Imaging:    I personally reviewed and interpreted the radiographs.   Assessment:   85 year old male status post left hip hemiarthroplasty complicated by infection.  At this time I would recommend continuing antibiotic suppression.  We have also collectively decided to pursue a more palliative care strategy so that we can maximize his comfort.  He will remain on IV antibiotics at this time.  We will continue to manage with weekly wound VAC changes to account for his drainage. Plan :    -Return to clinic in 2 weeks for reading assessment       I personally saw and evaluated the patient, and participated in the management and treatment plan.  Vanetta Mulders, MD Attending Physician, Orthopedic Surgery  This document was dictated using Dragon voice recognition software. A reasonable attempt at proof reading has been made to minimize errors.

## 2021-02-28 DIAGNOSIS — R4182 Altered mental status, unspecified: Secondary | ICD-10-CM | POA: Diagnosis not present

## 2021-02-28 DIAGNOSIS — T8452XD Infection and inflammatory reaction due to internal left hip prosthesis, subsequent encounter: Secondary | ICD-10-CM | POA: Diagnosis not present

## 2021-02-28 DIAGNOSIS — F411 Generalized anxiety disorder: Secondary | ICD-10-CM | POA: Diagnosis not present

## 2021-02-28 DIAGNOSIS — S72002D Fracture of unspecified part of neck of left femur, subsequent encounter for closed fracture with routine healing: Secondary | ICD-10-CM | POA: Diagnosis not present

## 2021-02-28 DIAGNOSIS — M6281 Muscle weakness (generalized): Secondary | ICD-10-CM | POA: Diagnosis not present

## 2021-02-28 DIAGNOSIS — R2681 Unsteadiness on feet: Secondary | ICD-10-CM | POA: Diagnosis not present

## 2021-02-28 DIAGNOSIS — Z79899 Other long term (current) drug therapy: Secondary | ICD-10-CM | POA: Diagnosis not present

## 2021-03-01 ENCOUNTER — Emergency Department (HOSPITAL_COMMUNITY): Payer: Medicare Other

## 2021-03-01 ENCOUNTER — Encounter (HOSPITAL_COMMUNITY): Payer: Self-pay

## 2021-03-01 ENCOUNTER — Emergency Department (HOSPITAL_COMMUNITY)
Admission: EM | Admit: 2021-03-01 | Discharge: 2021-03-02 | Disposition: A | Discharge status: Home / Self Care | Payer: Medicare Other | Attending: Emergency Medicine | Admitting: Emergency Medicine

## 2021-03-01 ENCOUNTER — Other Ambulatory Visit: Payer: Self-pay

## 2021-03-01 DIAGNOSIS — S098XXA Other specified injuries of head, initial encounter: Secondary | ICD-10-CM | POA: Diagnosis not present

## 2021-03-01 DIAGNOSIS — J9811 Atelectasis: Secondary | ICD-10-CM | POA: Diagnosis not present

## 2021-03-01 DIAGNOSIS — Z452 Encounter for adjustment and management of vascular access device: Secondary | ICD-10-CM | POA: Diagnosis not present

## 2021-03-01 DIAGNOSIS — W06XXXA Fall from bed, initial encounter: Secondary | ICD-10-CM | POA: Insufficient documentation

## 2021-03-01 DIAGNOSIS — Y9389 Activity, other specified: Secondary | ICD-10-CM | POA: Insufficient documentation

## 2021-03-01 DIAGNOSIS — A4902 Methicillin resistant Staphylococcus aureus infection, unspecified site: Secondary | ICD-10-CM | POA: Insufficient documentation

## 2021-03-01 DIAGNOSIS — W19XXXA Unspecified fall, initial encounter: Secondary | ICD-10-CM | POA: Diagnosis not present

## 2021-03-01 DIAGNOSIS — Z95828 Presence of other vascular implants and grafts: Secondary | ICD-10-CM

## 2021-03-01 DIAGNOSIS — L97522 Non-pressure chronic ulcer of other part of left foot with fat layer exposed: Secondary | ICD-10-CM | POA: Diagnosis not present

## 2021-03-01 DIAGNOSIS — M009 Pyogenic arthritis, unspecified: Secondary | ICD-10-CM | POA: Insufficient documentation

## 2021-03-01 DIAGNOSIS — S71002A Unspecified open wound, left hip, initial encounter: Secondary | ICD-10-CM | POA: Diagnosis not present

## 2021-03-01 DIAGNOSIS — T8189XA Other complications of procedures, not elsewhere classified, initial encounter: Secondary | ICD-10-CM | POA: Diagnosis not present

## 2021-03-01 DIAGNOSIS — B9689 Other specified bacterial agents as the cause of diseases classified elsewhere: Secondary | ICD-10-CM | POA: Diagnosis not present

## 2021-03-01 DIAGNOSIS — L97322 Non-pressure chronic ulcer of left ankle with fat layer exposed: Secondary | ICD-10-CM | POA: Diagnosis not present

## 2021-03-01 DIAGNOSIS — S0990XA Unspecified injury of head, initial encounter: Secondary | ICD-10-CM | POA: Diagnosis not present

## 2021-03-01 DIAGNOSIS — I639 Cerebral infarction, unspecified: Secondary | ICD-10-CM | POA: Diagnosis not present

## 2021-03-01 DIAGNOSIS — E119 Type 2 diabetes mellitus without complications: Secondary | ICD-10-CM | POA: Diagnosis not present

## 2021-03-01 DIAGNOSIS — L97312 Non-pressure chronic ulcer of right ankle with fat layer exposed: Secondary | ICD-10-CM | POA: Diagnosis not present

## 2021-03-01 DIAGNOSIS — I10 Essential (primary) hypertension: Secondary | ICD-10-CM | POA: Diagnosis not present

## 2021-03-01 DIAGNOSIS — Z043 Encounter for examination and observation following other accident: Secondary | ICD-10-CM | POA: Diagnosis present

## 2021-03-01 DIAGNOSIS — Z7901 Long term (current) use of anticoagulants: Secondary | ICD-10-CM | POA: Insufficient documentation

## 2021-03-01 DIAGNOSIS — I6782 Cerebral ischemia: Secondary | ICD-10-CM | POA: Diagnosis not present

## 2021-03-01 LAB — CBC
HCT: 30.1 % — ABNORMAL LOW (ref 39.0–52.0)
Hemoglobin: 9.3 g/dL — ABNORMAL LOW (ref 13.0–17.0)
MCH: 27.8 pg (ref 26.0–34.0)
MCHC: 30.9 g/dL (ref 30.0–36.0)
MCV: 90.1 fL (ref 80.0–100.0)
Platelets: 244 10*3/uL (ref 150–400)
RBC: 3.34 MIL/uL — ABNORMAL LOW (ref 4.22–5.81)
RDW: 15.3 % (ref 11.5–15.5)
WBC: 8 10*3/uL (ref 4.0–10.5)
nRBC: 0 % (ref 0.0–0.2)

## 2021-03-01 LAB — COMPREHENSIVE METABOLIC PANEL
ALT: 9 U/L (ref 0–44)
AST: 13 U/L — ABNORMAL LOW (ref 15–41)
Albumin: 2.1 g/dL — ABNORMAL LOW (ref 3.5–5.0)
Alkaline Phosphatase: 61 U/L (ref 38–126)
Anion gap: 6 (ref 5–15)
BUN: 15 mg/dL (ref 8–23)
CO2: 28 mmol/L (ref 22–32)
Calcium: 8.2 mg/dL — ABNORMAL LOW (ref 8.9–10.3)
Chloride: 107 mmol/L (ref 98–111)
Creatinine, Ser: 0.75 mg/dL (ref 0.61–1.24)
GFR, Estimated: >60 mL/min (ref >60)
Glucose, Bld: 62 mg/dL — ABNORMAL LOW (ref 70–99)
Potassium: 3.5 mmol/L (ref 3.5–5.1)
Sodium: 141 mmol/L (ref 135–145)
Total Bilirubin: 0.4 mg/dL (ref 0.3–1.2)
Total Protein: 5.4 g/dL — ABNORMAL LOW (ref 6.5–8.1)

## 2021-03-01 LAB — CBG MONITORING, ED: Glucose-Capillary: 73 mg/dL (ref 70–99)

## 2021-03-01 LAB — VANCOMYCIN, RANDOM: Vancomycin Rm: 16

## 2021-03-01 IMAGING — DX DG CHEST 1V PORT
1 series · 1 of 1 positions shown · non-contrast
Comparison: [DATE]

CLINICAL DATA: Status post fall.

EXAM:
PORTABLE CHEST 1 VIEW

[chest]
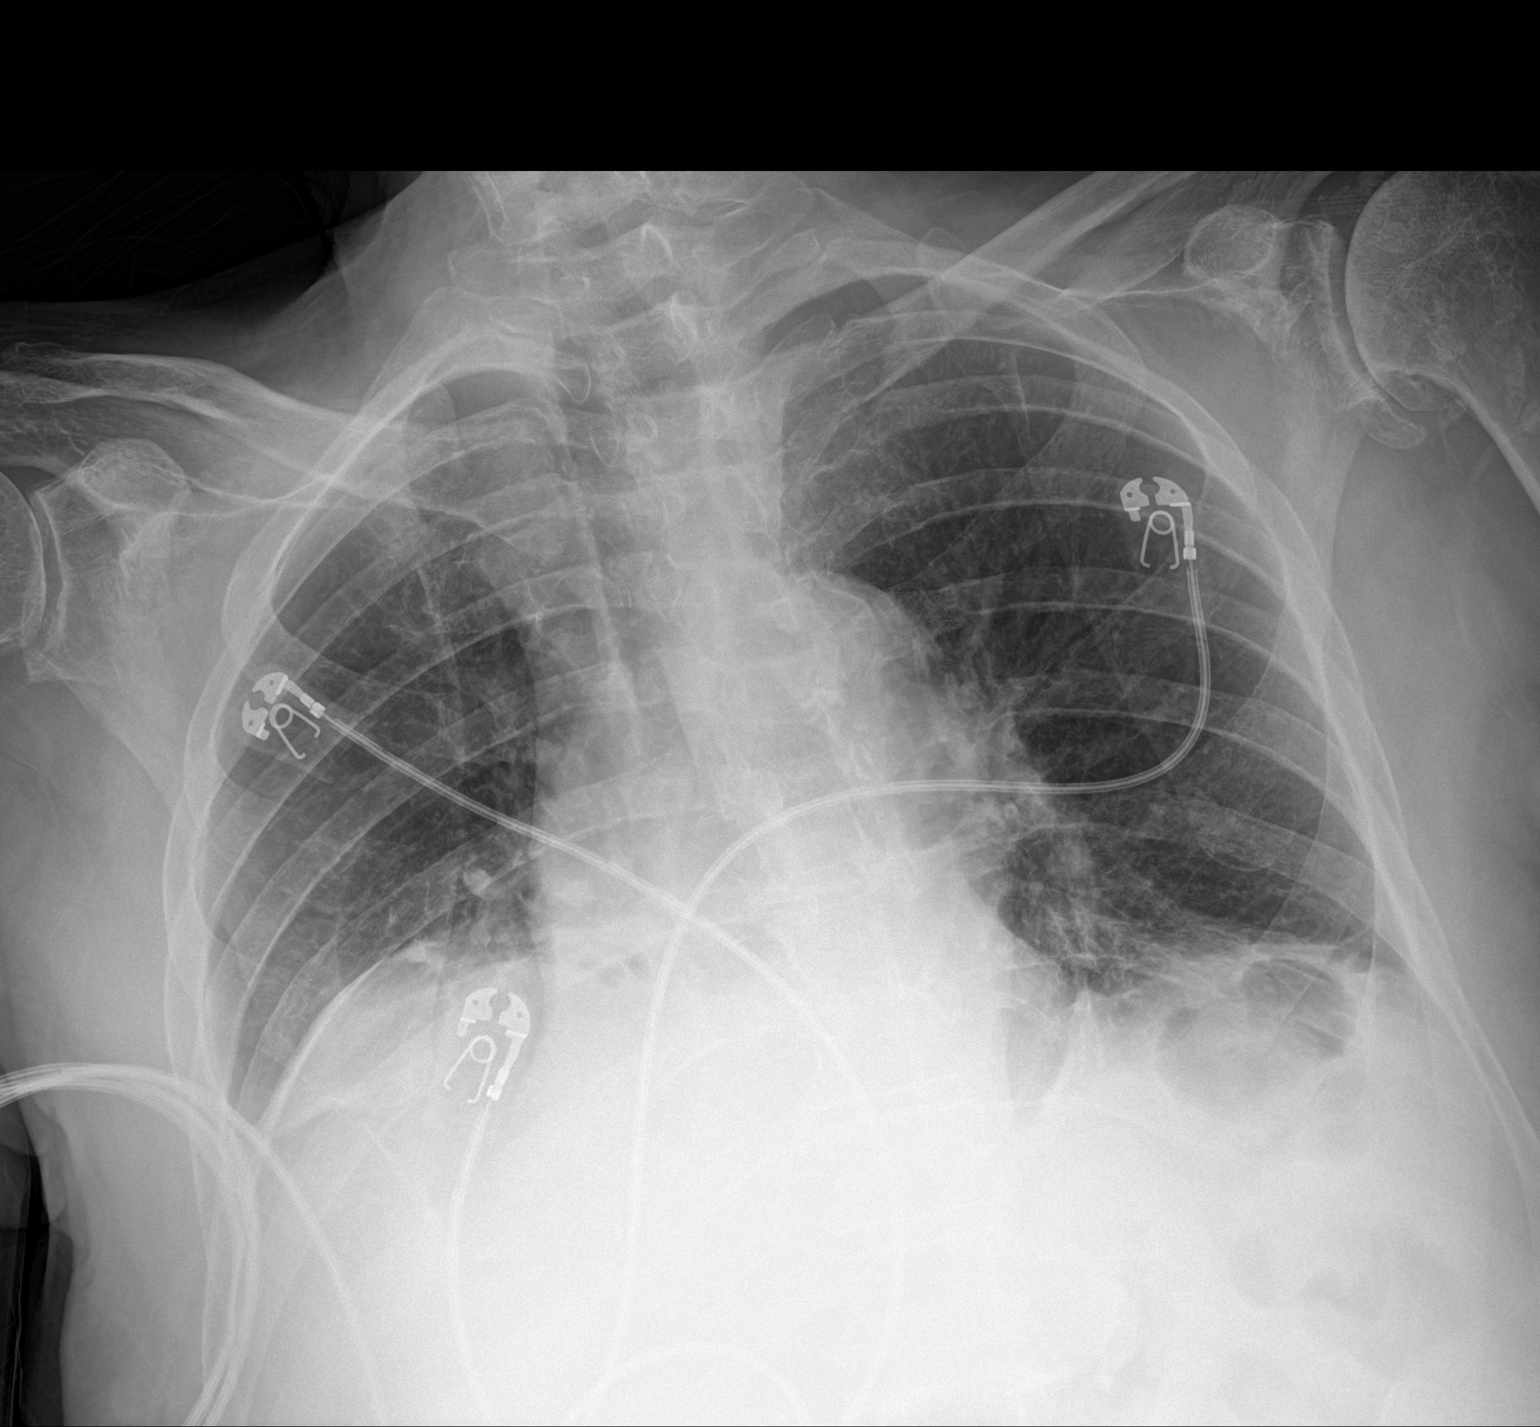

[1 of 1 positions shown; findings below may reference images not displayed]

FINDINGS: The heart size and mediastinal contours are stable. Mild atelectasis
of left lung base is identified. Chronic elevation of right
hemidiaphragm is noted. The visualized skeletal structures are
unremarkable.
IMPRESSION: Mild atelectasis of left lung base.

## 2021-03-01 IMAGING — DX DG CHEST 1V PORT
1 series · 1 of 1 positions shown · non-contrast
Comparison: [DATE]

CLINICAL DATA: PICC line placement

EXAM:
PORTABLE CHEST 1 VIEW

[chest]
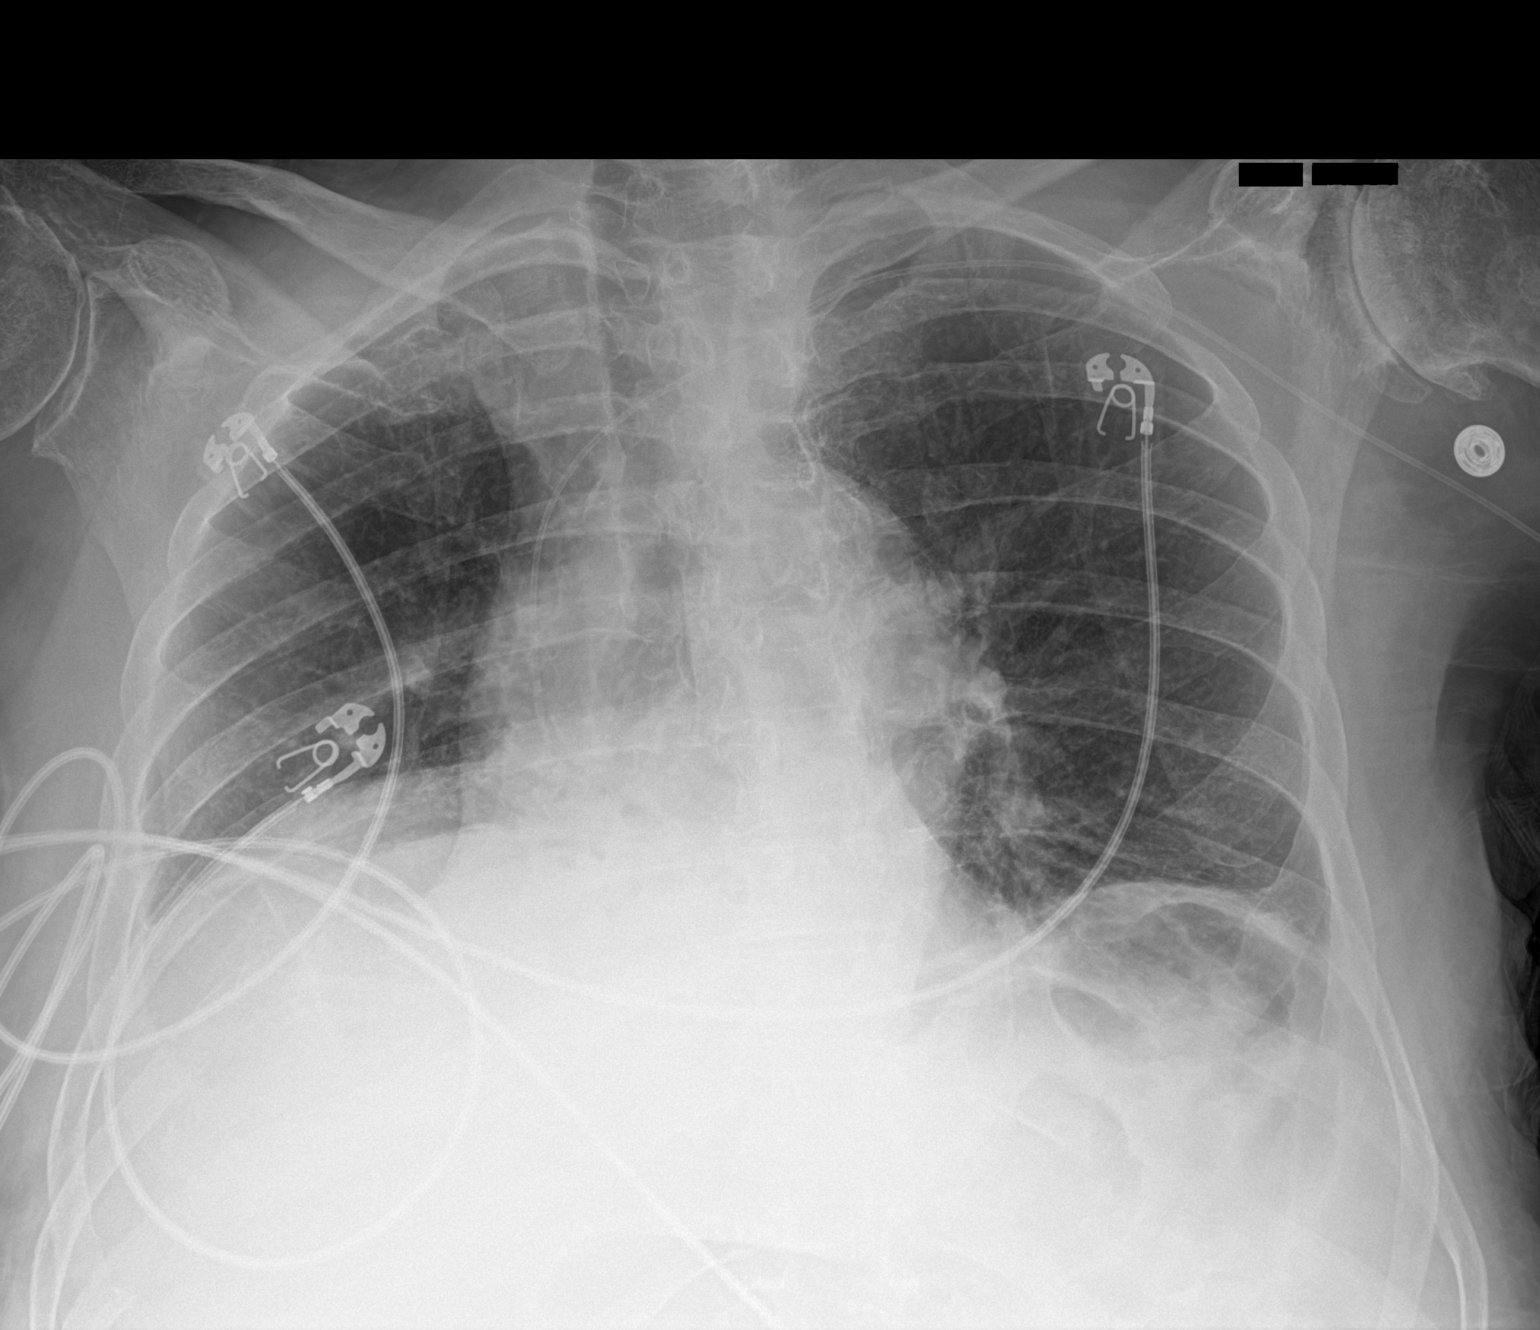

[1 of 1 positions shown; findings below may reference images not displayed]

FINDINGS: Single frontal view of the chest demonstrates left-sided PICC tip
overlying superior vena cava. Cardiac silhouette is stable. Chronic
elevation of the right hemidiaphragm. Patchy consolidation at the
lung bases consistent with atelectasis. No effusion or pneumothorax.
No acute bony abnormalities.
IMPRESSION: 1. Left-sided PICC tip projects over superior vena cava.
2. Stable hypoventilatory changes.

## 2021-03-01 IMAGING — CT CT HEAD W/O CM
4 series · 17 of 47 positions shown, 19 images · non-contrast
Comparison: [DATE]

CLINICAL DATA: Head trauma, minor (Age >= 65y)

EXAM:
CT HEAD WITHOUT CONTRAST
TECHNIQUE: Contiguous axial images were obtained from the base of the skull
through the vertex without intravenous contrast.

[Series 3: head wo · axial · 0.42mm/px · z∈[-94,+26]mm · 7 of 33 slices shown, 9 images]
[im 5/33  brain]
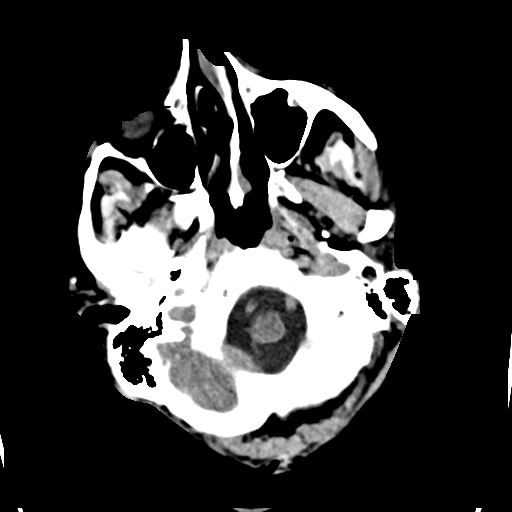
[im 5/33  bone]
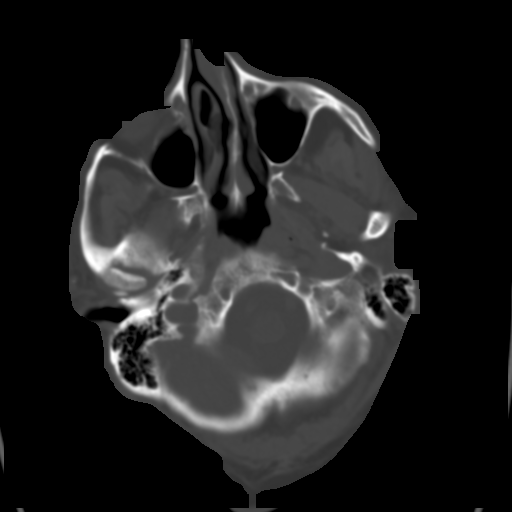
[im 9/33  brain]
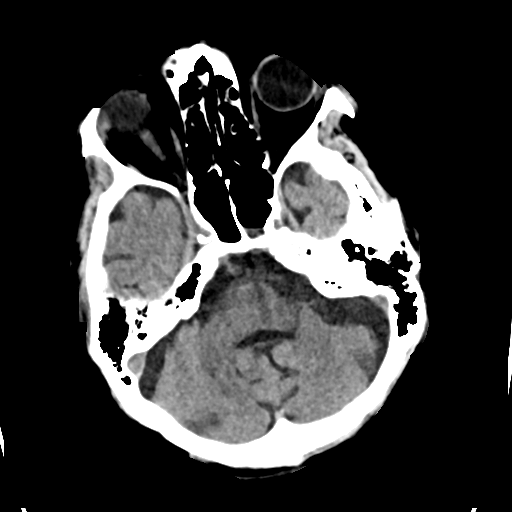
[im 13/33  brain]
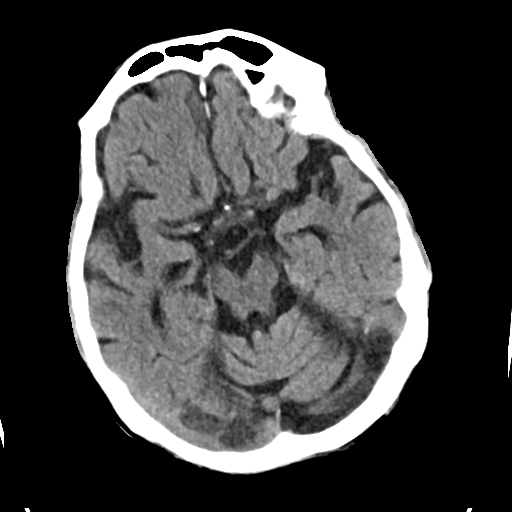
[im 17/33  brain]
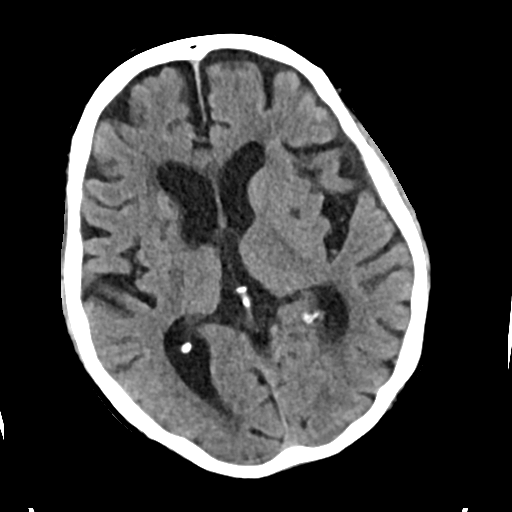
[im 21/33  brain]
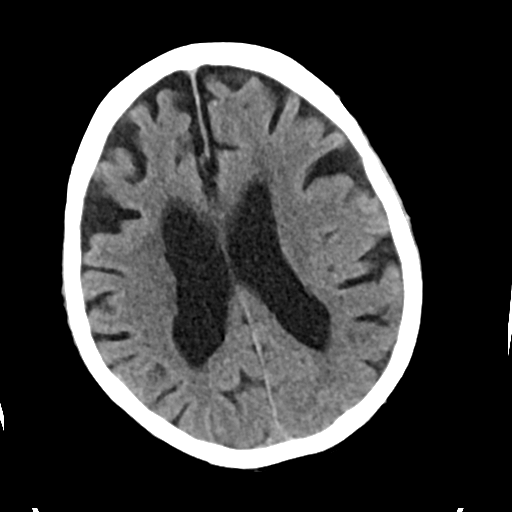
[im 21/33  bone]
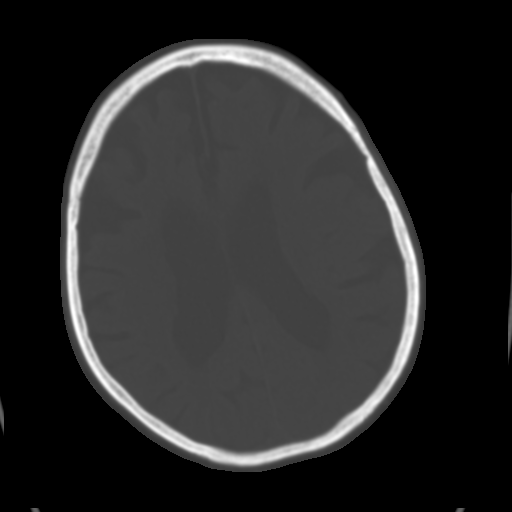
[im 25/33  brain]
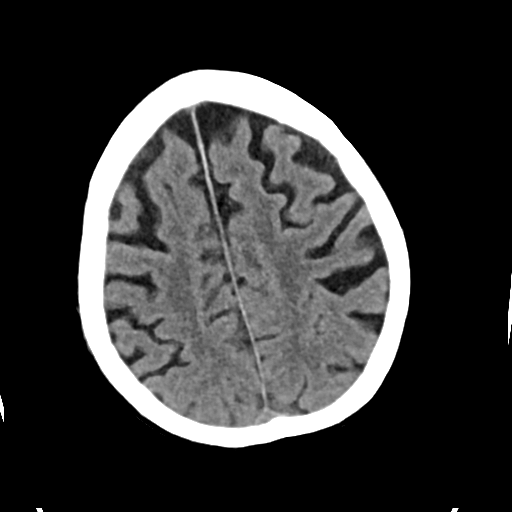
[im 29/33  brain]
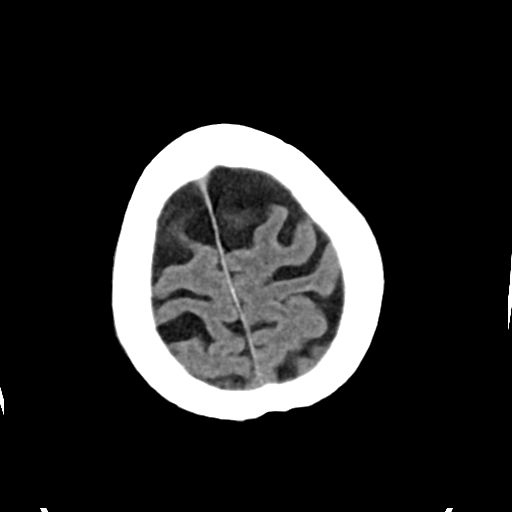

[Series 4: head bone · axial · 0.42mm/px · z∈[-98,-42]mm · 4 of 82 slices shown]
[im 9/82  bone]
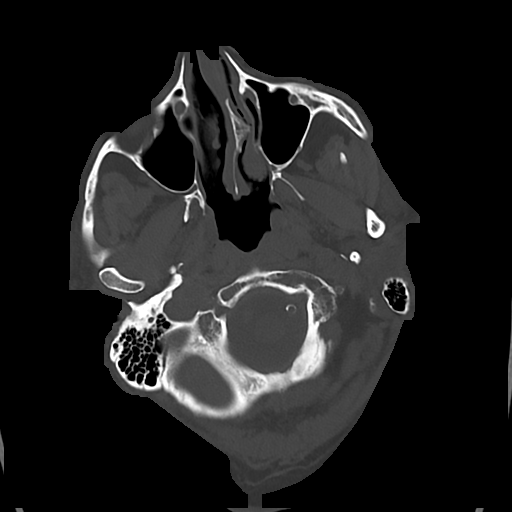
[im 17/82  bone]
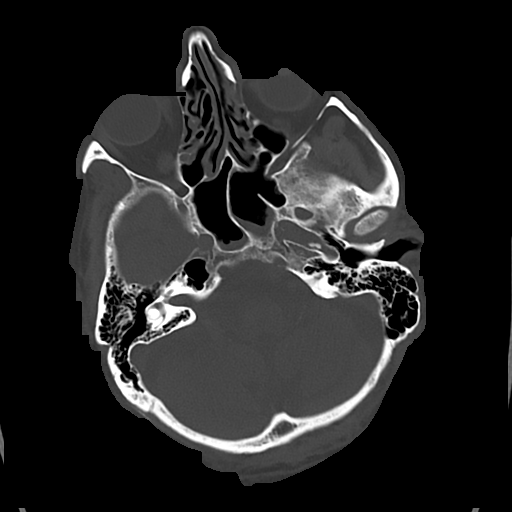
[im 25/82  bone]
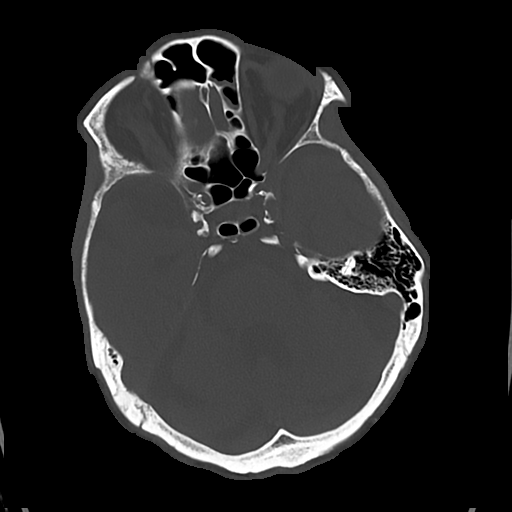
[im 37/82  bone]
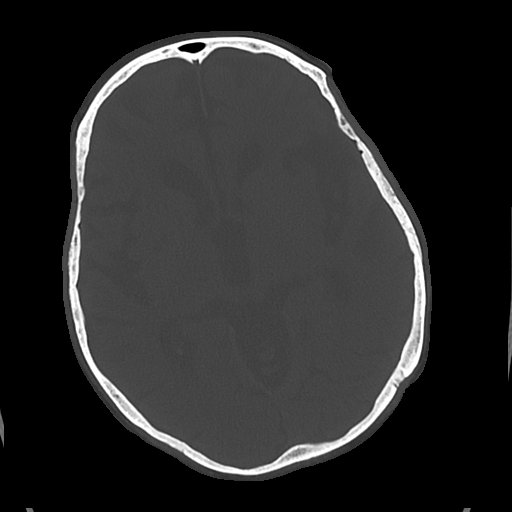

[Series 5: cor soft · coronal · 0.36mm/px · 3 of 76 slices shown]
[im 26/76  brain]
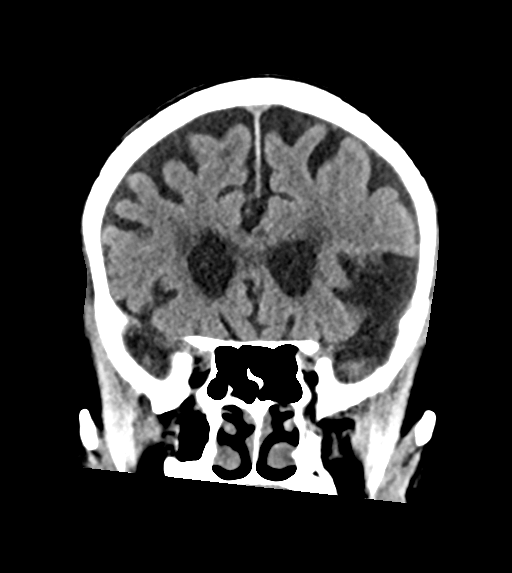
[im 34/76  brain]
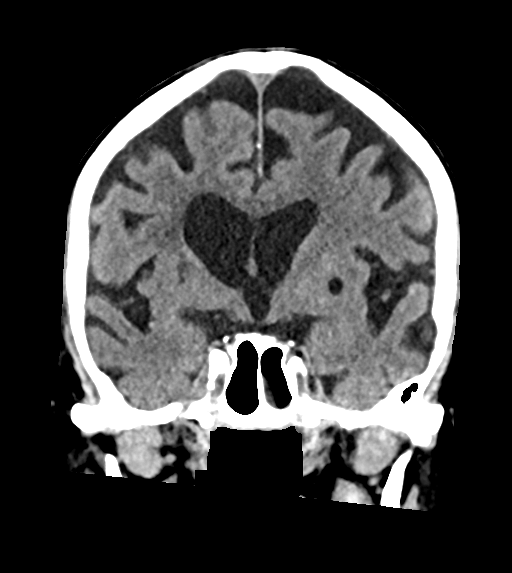
[im 42/76  brain]
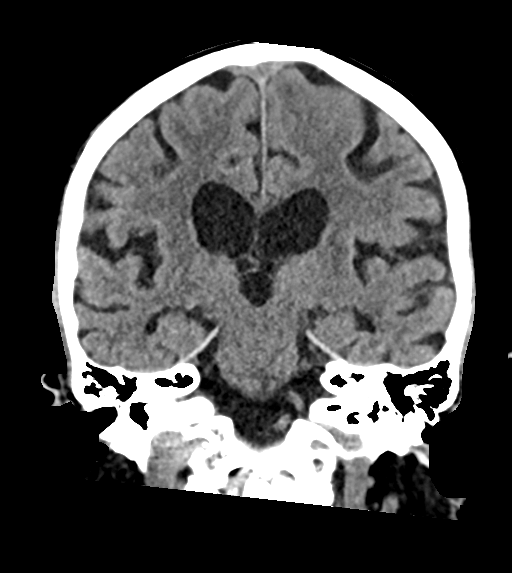

[Series 6: sag soft · sagittal · 0.37mm/px · 3 of 62 slices shown]
[im 21/62  brain]
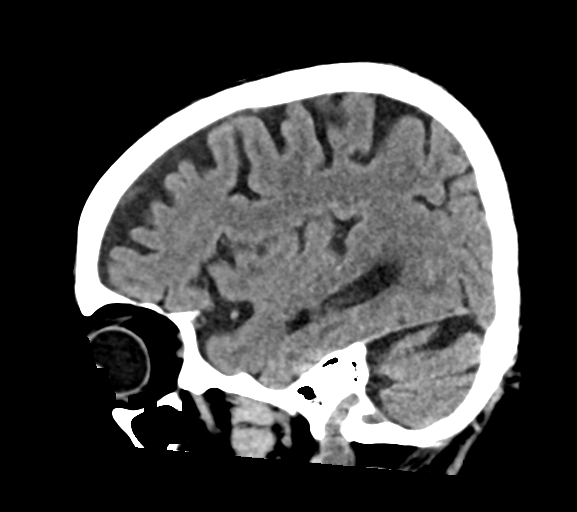
[im 31/62  brain]
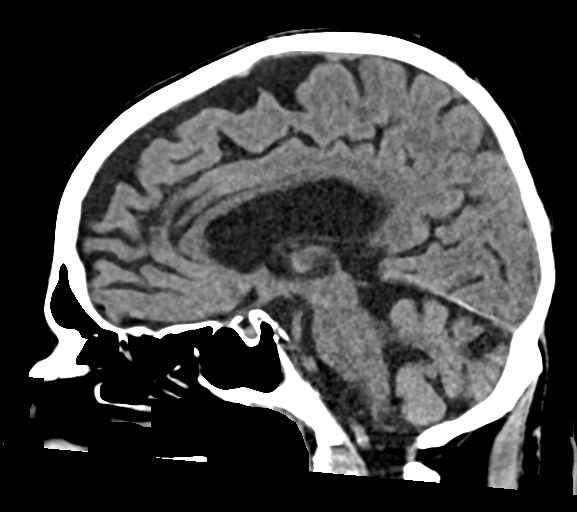
[im 41/62  brain]
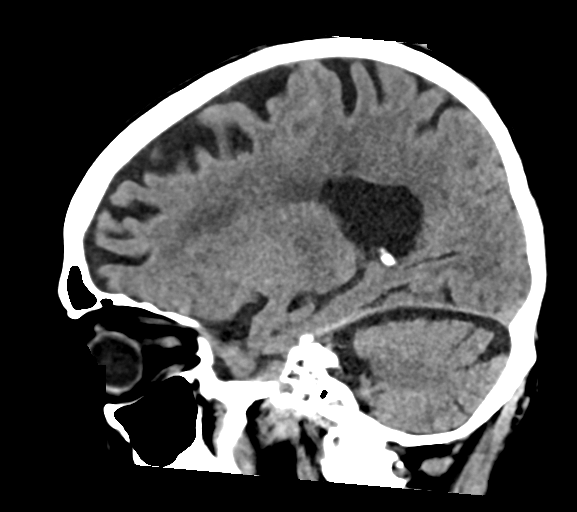

[17 of 47 positions shown; findings below may reference images not displayed]

FINDINGS: Brain: No evidence of acute infarction, hemorrhage, hydrocephalus,
extra-axial collection or mass lesion/mass effect. Chronic bilateral
basal ganglia infarcts. Stable size and configuration of the
ventricles. Patchy low-density changes within the periventricular
and subcortical white matter compatible with chronic microvascular
ischemic change. Mild diffuse cerebral volume loss.

Vascular: Atherosclerotic calcifications involving the large vessels
of the skull base. No unexpected hyperdense vessel.

Skull: Normal. Negative for fracture or focal lesion.

Sinuses/Orbits: Mucosal thickening of the bilateral ethmoid air
cells. Small amount of layering debris in the bilateral sphenoid
sinuses.

Other: None.
IMPRESSION: 1. No acute intracranial findings.
2. Chronic microvascular ischemic change and cerebral volume loss.

## 2021-03-01 MED ORDER — SODIUM CHLORIDE 0.9 % IV SOLN
1.0000 g | INTRAVENOUS | Status: DC
  Filled 2021-03-01: qty 1

## 2021-03-01 MED ORDER — VANCOMYCIN HCL 1500 MG/300ML IV SOLN
1500.0000 mg | INTRAVENOUS | Status: DC
  Administered 2021-03-01: 1500 mg via INTRAVENOUS
  Filled 2021-03-01: qty 300

## 2021-03-01 NOTE — ED Provider Notes (Signed)
[facility] EMERGENCY DEPARTMENT Provider Note   CSN: [id number] Arrival date & time: 03/01/21  1354     History Chief Complaint  Patient presents with   Fall   Level 2    xarelto    Jason Hartman is a 85 y.o. male.   Fall Pertinent negatives include no abdominal pain and no shortness of breath. Patient presents as a level 2 trauma.  Came from nursing home.  Rolled out of bed grabbing for something.  Did bump his head and is reportedly on Xarelto although reviewing MAR it appears he may not have had it for a few days.  Also has a PICC line in his left upper extremity for ertapenem and vancomycin for left hip infection.  The line was pulled out with the fall.  Really not complaining of any other pain.  Appears of gotten both the antibiotics this morning.  No neck pain     Past Medical History:  Diagnosis Date   CAD (coronary artery disease)    s/p stents   Cellulitis and abscess of leg, except foot    Hypertension    Obstructive sleep apnea (adult) (pediatric)    Restless legs syndrome (RLS)    Thoracic or lumbosacral neuritis or radiculitis, unspecified    Type II or unspecified type diabetes mellitus without mention of complication, not stated as uncontrolled    Unspecified disease of pericardium    Unspecified hereditary and idiopathic peripheral neuropathy     Patient Active Problem List   Diagnosis Date Noted   MRSA infection    ESBL (extended spectrum beta-lactamase) producing bacteria infection 01/27/2021   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[place]) 01/26/2021   Hip fracture ([place]) 12/29/2020   Malnutrition of moderate degree 10/27/2020   UTI (urinary tract infection) 10/25/2020   Sepsis ([place]) 10/12/2020   AMS (altered mental status)    PAF (paroxysmal atrial fibrillation) (HCC)    Acute metabolic encephalopathy 25/08/3974   Anxiety 09/09/2020   SIRS (systemic inflammatory response syndrome) ([place]) 09/09/2020   Diaphoresis    Slow transit  constipation 08/07/2020   Abdominal aortic aneurysm without rupture 03/17/2020   Abnormal gait 03/17/2020   Ascending aorta dilatation ([place]) 03/17/2020   Diabetic renal disease ([place]) 03/17/2020   Recurrent UTI 07/16/2019   Hypertensive urgency 04/26/2019   Malignant HTN with heart disease, w/o CHF, w/o chronic kidney disease 04/26/2019   Elevated troponin    LFTs abnormal    Statin intolerance 01/15/2019   Penis pain 04/10/2018   Urethra cancer ([place]) 02/25/2018   Lower extremity edema 06/09/2017   Peptic ulcer disease 05/16/2017   GERD (gastroesophageal reflux disease) 05/16/2017   Chronic kidney disease, stage 3a ([place]) 04/11/2017   Lower urinary tract symptoms (LUTS) 04/15/2016   Narcotic dependence ([place]) 08/31/2015   Imbalance 08/31/2015   Diabetic peripheral neuropathy ([place]) 08/31/2015   Other malaise and fatigue 05/02/2015   Chronic fatigue 05/02/2015   Panic disorder without agoraphobia 03/22/2015   Hyperlipidemia 02/03/2014   Thrombocytopenia ([place]) 08/18/2013   Parkinsonism ([place]) 08/18/2013   Other disorders of lung 08/18/2013   Organic impotence 08/18/2013   Memory loss 08/18/2013   Low back pain 08/18/2013   Iron deficiency anemia 08/18/2013   Hypercalcemia 08/18/2013   Cellulitis of right leg 08/18/2013   Atherosclerotic heart disease of native coronary artery without angina pectoris 08/18/2013   RLS (restless legs syndrome) 09/01/2012   HERPES SIMPLEX INFECTION 11/06/2006   Type II diabetes mellitus ([place]) 11/06/2006  HYPOGONADISM 11/06/2006   VITAMIN D DEFICIENCY 11/06/2006   ANXIETY 11/06/2006   OBSTRUCTIVE SLEEP APNEA 11/06/2006   RESTLESS LEG SYNDROME 11/06/2006   Essential hypertension 11/06/2006   BENIGN PROSTATIC HYPERTROPHY 11/06/2006   ROSACEA 11/06/2006   OSTEOARTHRITIS 11/06/2006   INSOMNIA 11/06/2006   HYPERGLYCEMIA 11/06/2006   COLONIC POLYPS, HX OF 11/06/2006    Past Surgical History:  Procedure Laterality Date   ANAL FISSURE REPAIR      ANTERIOR APPROACH HEMI HIP ARTHROPLASTY Left 12/30/2020   Procedure: ANTERIOR APPROACH HIP ARTHROPLASTY;  Surgeon: Vanetta Mulders, MD;  Location: [place];  Service: Orthopedics;  Laterality: Left;   APPLICATION OF WOUND VAC Left 01/26/2021   Procedure: APPLICATION OF WOUND VAC;  Surgeon: Vanetta Mulders, MD;  Location: [place];  Service: Orthopedics;  Laterality: Left;   I & D EXTREMITY Left 01/26/2021   Procedure: IRRIGATION AND DEBRIDEMENT LEFT HIP;  Surgeon: Vanetta Mulders, MD;  Location: [place];  Service: Orthopedics;  Laterality: Left;  30 MIN   pericarditis  2009       Family History  Problem Relation Age of Onset   Diabetes Father     Social History   Tobacco Use   Smoking status: Former    Types: Cigarettes    Quit date: 09/01/1980    Years since quitting: 40.5   Smokeless tobacco: Never  Vaping Use   Vaping Use: Never used  Substance Use Topics   Alcohol use: Not Currently    Comment: rare   Drug use: Never    Home Medications Prior to Admission medications   Medication Sig Start Date End Date Taking? Authorizing Provider  acetaminophen (TYLENOL) 500 MG tablet Take 1,000 mg by mouth every 8 (eight) hours.    [provider]  Acidophilus Lactobacillus CAPS Take 1 capsule by mouth every evening.    [provider]  amiodarone (PACERONE) 200 MG tablet Take 1 tablet (200 mg total) by mouth daily. 09/26/20   Mercy Riding, MD  apixaban (ELIQUIS) 5 MG TABS tablet Take 1 tablet (5 mg total) by mouth 2 (two) times daily. 02/01/21   Barb Merino, MD  clonazePAM (KLONOPIN) 0.25 MG disintegrating tablet Take 1 tablet (0.25 mg total) by mouth every morning. 02/01/21   Barb Merino, MD  clonazePAM (KLONOPIN) 0.5 MG tablet Take 1 tablet (0.5 mg total) by mouth at bedtime. 02/01/21   Barb Merino, MD  ertapenem [facility]) IVPB Inject 1 g into the vein daily. Indication:  PJI First Dose: Yes Last Day of Therapy:  03/23/21 Labs - Once weekly:  CBC/D and BMP, Labs  - Every other week:  ESR and CRP Method of administration: Mini-Bag Plus / Gravity Method of administration may be changed at the discretion of home infusion pharmacist based upon assessment of the patient and/or caregiver's ability to self-administer the medication ordered. 02/01/21 03/23/21  Barb Merino, MD  famotidine (PEPCID) 20 MG tablet Take 20 mg by mouth every morning.    [provider]  ferrous sulfate 325 (65 FE) MG EC tablet Take 325 mg by mouth every morning. 05/28/18   [provider]  gabapentin (NEURONTIN) 300 MG capsule Take 1 capsule (300 mg total) by mouth at bedtime. 09/25/20   Mercy Riding, MD  LANTUS SOLOSTAR 100 UNIT/ML Solostar Pen Inject 8 Units into the skin 2 (two) times daily. 09/25/20   Mercy Riding, MD  melatonin 5 MG TABS Take 5 mg by mouth at bedtime.    [provider]  metFORMIN (  GLUCOPHAGE-XR) 500 MG 24 hr tablet Take 500 mg by mouth daily with breakfast. 01/17/20   [provider]  methocarbamol (ROBAXIN) 500 MG tablet Take 1 tablet (500 mg total) by mouth every 6 (six) hours as needed for muscle spasms. 01/08/21   Amin, Jeanella Flattery, MD  metoprolol tartrate (LOPRESSOR) 25 MG tablet Take 0.5 tablets (12.5 mg total) by mouth 2 (two) times daily. 09/25/20   Mercy Riding, MD  nystatin (MYCOSTATIN/NYSTOP) powder Apply 1 application topically 2 (two) times daily. To rash on back, groin, and sacrum    [provider]  OLANZapine (ZYPREXA) 2.5 MG tablet Take 1 tablet (2.5 mg total) by mouth at bedtime. 02/01/21 03/03/21  Barb Merino, MD  polyethylene glycol powder (GLYCOLAX/MIRALAX) 17 GM/SCOOP powder Take 17 g by mouth every morning. Mix in 8 oz water and drink    [provider]  Rotigotine (NEUPRO) 8 MG/24HR PT24 Place 1 patch onto the skin at bedtime. For Parkinson's    [provider]  Semaglutide,0.25 or 0.5MG/DOS, (OZEMPIC, 0.25 OR 0.5 MG/DOSE,) 2 MG/1.5ML SOPN Inject 0.5 mg into the skin every  Tuesday.    [provider]  senna-docusate (SENOKOT-S) 8.6-50 MG tablet Take 1 tablet by mouth 2 (two) times daily as needed for moderate constipation. 09/25/20   Mercy Riding, MD  thiamine 100 MG tablet Take 1 tablet (100 mg total) by mouth daily. 09/26/20   Mercy Riding, MD  traMADol (ULTRAM) 50 MG tablet Take 1.5 tablets (75 mg total) by mouth 2 (two) times daily. 02/01/21   Barb Merino, MD  vancomycin IVPB Inject 1,250 mg into the vein daily. Indication:  PJI First Dose: Yes Last Day of Therapy:  03/23/21 Labs - Sunday/Monday:  CBC/D, BMP, and vancomycin trough. Labs - Thursday:  BMP and vancomycin trough Labs - Every other week:  ESR and CRP Method of administration:Elastomeric Method of administration may be changed at the discretion of the patient and/or caregiver's ability to self-administer the medication ordered. 02/01/21 03/23/21  Barb Merino, MD    Allergies    Oxycodone, Iodinated diagnostic agents, Iodine, Linezolid, Methadone hcl, Promethazine, Isovue [iopamidol], Methadone, Morphine sulfate, Omnipaque [iohexol], Quetiapine, Renografin [diatrizoate], Statins, Zolpidem, Atorvastatin, Carbidopa-levodopa, Chlorhexidine gluconate [chlorhexidine], Clindamycin, Colesevelam, Doxazosin, Lovastatin, and Rosuvastatin  Review of Systems   Review of Systems  Constitutional:  Negative for appetite change.  HENT:  Negative for congestion.   Respiratory:  Negative for shortness of breath.   Cardiovascular:  Negative for leg swelling.  Gastrointestinal:  Negative for abdominal pain.  Musculoskeletal:  Negative for back pain.       Chronic left hip wound  Neurological:  Negative for weakness.  Psychiatric/Behavioral:  Negative for confusion.    Physical Exam Updated Vital Signs BP (!) 141/65   Pulse (!) 57   Temp 97.6 F (36.4 C)   Resp 20   Ht 5' 10"  (1.778 m)   Wt 71.2 kg   SpO2 98%   BMI 22.53 kg/m   Physical Exam Vitals and nursing note reviewed.  HENT:      Head: Atraumatic.  Eyes:     Pupils: Pupils are equal, round, and reactive to light.  Cardiovascular:     Rate and Rhythm: Regular rhythm.  Abdominal:     Tenderness: There is no abdominal tenderness.  Musculoskeletal:     Comments: Wound VAC in place on left hip.  Left upper extremity in dressing where PICC line had been previously.  No extremity tenderness besides of the  left hip which is chronic.  Skin:    General: Skin is warm.     Capillary Refill: Capillary refill takes less than 2 seconds.  Neurological:     Mental Status: He is alert and oriented to person, place, and time.    ED Results / Procedures / Treatments   Labs (all labs ordered are listed, but only abnormal results are displayed) Labs Reviewed  COMPREHENSIVE METABOLIC PANEL  CBC  VANCOMYCIN, RANDOM    EKG None  Radiology CT HEAD WO CONTRAST (5MM)  Result Date: 03/01/2021 CLINICAL DATA:  Head trauma, minor (Age >= 65y) EXAM: CT HEAD WITHOUT CONTRAST TECHNIQUE: Contiguous axial images were obtained from the base of the skull through the vertex without intravenous contrast. COMPARISON:  01/11/2021 FINDINGS: Brain: No evidence of acute infarction, hemorrhage, hydrocephalus, extra-axial collection or mass lesion/mass effect. Chronic bilateral basal ganglia infarcts. Stable size and configuration of the ventricles. Patchy low-density changes within the periventricular and subcortical white matter compatible with chronic microvascular ischemic change. Mild diffuse cerebral volume loss. Vascular: Atherosclerotic calcifications involving the large vessels of the skull base. No unexpected hyperdense vessel. Skull: Normal. Negative for fracture or focal lesion. Sinuses/Orbits: Mucosal thickening of the bilateral ethmoid air cells. Small amount of layering debris in the bilateral sphenoid sinuses. Other: None. IMPRESSION: 1. No acute intracranial findings. 2. Chronic microvascular ischemic change and cerebral volume loss.  Electronically Signed   By: Davina Poke D.O.   On: 03/01/2021 14:51   DG Chest Portable 1 View  Result Date: 03/01/2021 CLINICAL DATA:  Status post fall. EXAM: PORTABLE CHEST 1 VIEW COMPARISON:  October 11, 2020 FINDINGS: The heart size and mediastinal contours are stable. Mild atelectasis of left lung base is identified. Chronic elevation of right hemidiaphragm is noted. The visualized skeletal structures are unremarkable. IMPRESSION: Mild atelectasis of left lung base. Electronically Signed   By: Abelardo Diesel M.D.   On: 03/01/2021 14:26   [place] EKG SITE RITE  Result Date: 03/01/2021 If Site Rite image not attached, placement could not be confirmed due to current cardiac rhythm.   Procedures Procedures   Medications Ordered in ED Medications - No data to display  ED Course  I have reviewed the triage vital signs and the nursing notes.  Pertinent labs & imaging results that were available during my care of the patient were reviewed by me and considered in my medical decision making (see chart for details).    MDM Rules/Calculators/A&P                           Patient with fall.  Came in as a level 2 trauma.  Head CT reassuring.  However had his PICC line pulled out.  Needs PICC line for his ertapenem and vancomycin.  Has had the infusion today.  Pending lab work and new PICC line placement.  Care turned over to Dr. Regenia Skeeter final Clinical Impression(s) / ED Diagnoses Final diagnoses:  None    Rx / DC Orders ED Discharge Orders     None        Davonna Belling, MD 03/01/21 [phone]

## 2021-03-01 NOTE — ED Provider Notes (Signed)
Labs are overall unremarkable compared to baseline.  He was given something to eat for his mildly low glucose which on fingerstick is normal.  PICC line was replaced and he is now stable for discharge home as per prior plan with Dr. Alvino Chapel.   Sherwood Gambler, MD 03/01/21 1902

## 2021-03-01 NOTE — Progress Notes (Signed)
Orthopedic Tech Progress Note Patient Details:  Jason Hartman 1935/04/24 [id number]  Checked in for Level 2 trauma with secretary.  Patient ID: ALPHEUS STIFF, male   DOB: 21-Aug-1935, 85 y.o.   MRN: [id number]  Carin Primrose 03/01/2021, 2:20 PM

## 2021-03-01 NOTE — ED Triage Notes (Addendum)
Level 2 BIB [facility] from [facility] d/t rolling OOB when he was reaching for something. Takes Xarelto, no LOC, denies hitting his head or any other injury. A/Ox4, verbal-able to make need known. No c/o acute pain, endorses chronic Lt hip pain, EDP aware. His established PICC was pulled out by mistake as a result from his fall.

## 2021-03-01 NOTE — Progress Notes (Signed)
Peripherally Inserted Central Catheter Placement  The IV Nurse has discussed with the patient and/or persons authorized to consent for the patient, the purpose of this procedure and the potential benefits and risks involved with this procedure.  The benefits include less needle sticks, lab draws from the catheter, and the patient may be discharged home with the catheter. Risks include, but not limited to, infection, bleeding, blood clot (thrombus formation), and puncture of an artery; nerve damage and irregular heartbeat and possibility to perform a PICC exchange if needed/ordered by physician.  Alternatives to this procedure were also discussed.  Bard Power PICC patient education guide, fact sheet on infection prevention and patient information card has been provided to patient /or left at bedside.    PICC Placement Documentation  PICC Single Lumen 35/52/17 Left Basilic 40 cm 0 cm (Active)  Indication for Insertion or Continuance of Line Prolonged intravenous therapies 03/01/21 1824  Exposed Catheter (cm) 0 cm 03/01/21 1824  Site Assessment Clean;Dry;Intact 03/01/21 1824  Line Status Flushed;Saline locked;Blood return noted 03/01/21 1824  Dressing Type Transparent;Securing device 03/01/21 1824  Dressing Status Clean;Dry;Intact 03/01/21 1824  Antimicrobial disc in place? Yes 03/01/21 1824  Safety Lock Not Applicable 47/15/95 3967  Dressing Intervention Other (Comment) 03/01/21 1824  Dressing Change Due 03/08/21 03/01/21 1824    RUA not used for placement of the PICC due to cephalic, brachial and basilic veins are smaller more than 45% vein occupancy.   Enos Fling 03/01/2021, 6:25 PM

## 2021-03-01 NOTE — ED Notes (Signed)
IV team at bedside 

## 2021-03-01 NOTE — ED Notes (Signed)
Chaplain responding to level 2 trauma.    Available for support during assessment.

## 2021-03-01 NOTE — ED Notes (Signed)
Patient transported to CT 

## 2021-03-02 ENCOUNTER — Emergency Department: Payer: Self-pay

## 2021-03-02 DIAGNOSIS — M6281 Muscle weakness (generalized): Secondary | ICD-10-CM | POA: Diagnosis not present

## 2021-03-02 DIAGNOSIS — F411 Generalized anxiety disorder: Secondary | ICD-10-CM | POA: Diagnosis not present

## 2021-03-02 DIAGNOSIS — R4182 Altered mental status, unspecified: Secondary | ICD-10-CM | POA: Diagnosis not present

## 2021-03-02 DIAGNOSIS — R41841 Cognitive communication deficit: Secondary | ICD-10-CM | POA: Diagnosis not present

## 2021-03-02 DIAGNOSIS — I10 Essential (primary) hypertension: Secondary | ICD-10-CM | POA: Diagnosis not present

## 2021-03-02 DIAGNOSIS — R278 Other lack of coordination: Secondary | ICD-10-CM | POA: Diagnosis not present

## 2021-03-02 DIAGNOSIS — I714 Abdominal aortic aneurysm, without rupture, unspecified: Secondary | ICD-10-CM | POA: Diagnosis not present

## 2021-03-02 DIAGNOSIS — T8452XD Infection and inflammatory reaction due to internal left hip prosthesis, subsequent encounter: Secondary | ICD-10-CM | POA: Diagnosis not present

## 2021-03-02 DIAGNOSIS — L03116 Cellulitis of left lower limb: Secondary | ICD-10-CM | POA: Diagnosis not present

## 2021-03-02 DIAGNOSIS — S72002D Fracture of unspecified part of neck of left femur, subsequent encounter for closed fracture with routine healing: Secondary | ICD-10-CM | POA: Diagnosis not present

## 2021-03-02 DIAGNOSIS — Z7401 Bed confinement status: Secondary | ICD-10-CM | POA: Diagnosis not present

## 2021-03-02 DIAGNOSIS — R2681 Unsteadiness on feet: Secondary | ICD-10-CM | POA: Diagnosis not present

## 2021-03-02 DIAGNOSIS — W19XXXA Unspecified fall, initial encounter: Secondary | ICD-10-CM | POA: Diagnosis not present

## 2021-03-03 DIAGNOSIS — R059 Cough, unspecified: Secondary | ICD-10-CM | POA: Diagnosis not present

## 2021-03-03 DIAGNOSIS — R0989 Other specified symptoms and signs involving the circulatory and respiratory systems: Secondary | ICD-10-CM | POA: Diagnosis not present

## 2021-03-04 DIAGNOSIS — T8452XD Infection and inflammatory reaction due to internal left hip prosthesis, subsequent encounter: Secondary | ICD-10-CM | POA: Diagnosis not present

## 2021-03-04 DIAGNOSIS — R4182 Altered mental status, unspecified: Secondary | ICD-10-CM | POA: Diagnosis not present

## 2021-03-04 DIAGNOSIS — S72002D Fracture of unspecified part of neck of left femur, subsequent encounter for closed fracture with routine healing: Secondary | ICD-10-CM | POA: Diagnosis not present

## 2021-03-04 DIAGNOSIS — F411 Generalized anxiety disorder: Secondary | ICD-10-CM | POA: Diagnosis not present

## 2021-03-04 DIAGNOSIS — R2681 Unsteadiness on feet: Secondary | ICD-10-CM | POA: Diagnosis not present

## 2021-03-04 DIAGNOSIS — M6281 Muscle weakness (generalized): Secondary | ICD-10-CM | POA: Diagnosis not present

## 2021-03-05 DIAGNOSIS — R4182 Altered mental status, unspecified: Secondary | ICD-10-CM | POA: Diagnosis not present

## 2021-03-05 DIAGNOSIS — M6281 Muscle weakness (generalized): Secondary | ICD-10-CM | POA: Diagnosis not present

## 2021-03-05 DIAGNOSIS — Z79899 Other long term (current) drug therapy: Secondary | ICD-10-CM | POA: Diagnosis not present

## 2021-03-05 DIAGNOSIS — T8452XD Infection and inflammatory reaction due to internal left hip prosthesis, subsequent encounter: Secondary | ICD-10-CM | POA: Diagnosis not present

## 2021-03-05 DIAGNOSIS — F411 Generalized anxiety disorder: Secondary | ICD-10-CM | POA: Diagnosis not present

## 2021-03-05 DIAGNOSIS — F5101 Primary insomnia: Secondary | ICD-10-CM | POA: Diagnosis not present

## 2021-03-05 DIAGNOSIS — R2681 Unsteadiness on feet: Secondary | ICD-10-CM | POA: Diagnosis not present

## 2021-03-05 DIAGNOSIS — S72002D Fracture of unspecified part of neck of left femur, subsequent encounter for closed fracture with routine healing: Secondary | ICD-10-CM | POA: Diagnosis not present

## 2021-03-05 DIAGNOSIS — F4323 Adjustment disorder with mixed anxiety and depressed mood: Secondary | ICD-10-CM | POA: Diagnosis not present

## 2021-03-06 DIAGNOSIS — I480 Paroxysmal atrial fibrillation: Secondary | ICD-10-CM | POA: Diagnosis not present

## 2021-03-06 DIAGNOSIS — F419 Anxiety disorder, unspecified: Secondary | ICD-10-CM | POA: Diagnosis not present

## 2021-03-06 DIAGNOSIS — R2681 Unsteadiness on feet: Secondary | ICD-10-CM | POA: Diagnosis not present

## 2021-03-06 DIAGNOSIS — T8452XD Infection and inflammatory reaction due to internal left hip prosthesis, subsequent encounter: Secondary | ICD-10-CM | POA: Diagnosis not present

## 2021-03-06 DIAGNOSIS — K5900 Constipation, unspecified: Secondary | ICD-10-CM | POA: Diagnosis not present

## 2021-03-06 DIAGNOSIS — S72002D Fracture of unspecified part of neck of left femur, subsequent encounter for closed fracture with routine healing: Secondary | ICD-10-CM | POA: Diagnosis not present

## 2021-03-06 DIAGNOSIS — M6281 Muscle weakness (generalized): Secondary | ICD-10-CM | POA: Diagnosis not present

## 2021-03-06 DIAGNOSIS — F411 Generalized anxiety disorder: Secondary | ICD-10-CM | POA: Diagnosis not present

## 2021-03-06 DIAGNOSIS — E1122 Type 2 diabetes mellitus with diabetic chronic kidney disease: Secondary | ICD-10-CM | POA: Diagnosis not present

## 2021-03-06 DIAGNOSIS — G894 Chronic pain syndrome: Secondary | ICD-10-CM | POA: Diagnosis not present

## 2021-03-06 DIAGNOSIS — Z8616 Personal history of COVID-19: Secondary | ICD-10-CM | POA: Diagnosis not present

## 2021-03-06 DIAGNOSIS — T8142XA Infection following a procedure, deep incisional surgical site, initial encounter: Secondary | ICD-10-CM | POA: Diagnosis not present

## 2021-03-06 DIAGNOSIS — R4182 Altered mental status, unspecified: Secondary | ICD-10-CM | POA: Diagnosis not present

## 2021-03-06 DIAGNOSIS — D509 Iron deficiency anemia, unspecified: Secondary | ICD-10-CM | POA: Diagnosis not present

## 2021-03-06 DIAGNOSIS — I10 Essential (primary) hypertension: Secondary | ICD-10-CM | POA: Diagnosis not present

## 2021-03-07 ENCOUNTER — Other Ambulatory Visit: Payer: Self-pay | Admitting: *Deleted

## 2021-03-07 DIAGNOSIS — M6281 Muscle weakness (generalized): Secondary | ICD-10-CM | POA: Diagnosis not present

## 2021-03-07 DIAGNOSIS — R2681 Unsteadiness on feet: Secondary | ICD-10-CM | POA: Diagnosis not present

## 2021-03-07 DIAGNOSIS — F411 Generalized anxiety disorder: Secondary | ICD-10-CM | POA: Diagnosis not present

## 2021-03-07 DIAGNOSIS — T8452XD Infection and inflammatory reaction due to internal left hip prosthesis, subsequent encounter: Secondary | ICD-10-CM | POA: Diagnosis not present

## 2021-03-07 DIAGNOSIS — S72002D Fracture of unspecified part of neck of left femur, subsequent encounter for closed fracture with routine healing: Secondary | ICD-10-CM | POA: Diagnosis not present

## 2021-03-07 DIAGNOSIS — R4182 Altered mental status, unspecified: Secondary | ICD-10-CM | POA: Diagnosis not present

## 2021-03-07 NOTE — Patient Outreach (Signed)
[place] Coordinator follow up. [name] resides in [facility] under private pay. Member screened for potential [facility] Care Management needs.   Communication sent to [facility] SW to inquire whether [name] will remain in facility long term.   Marthenia Rolling, MSN, RN,BSN [place] [facility] Coordinator [phone] [facility]) [phone]  (Toll free office)

## 2021-03-08 ENCOUNTER — Telehealth: Payer: Self-pay

## 2021-03-08 DIAGNOSIS — M6281 Muscle weakness (generalized): Secondary | ICD-10-CM | POA: Diagnosis not present

## 2021-03-08 DIAGNOSIS — R41841 Cognitive communication deficit: Secondary | ICD-10-CM | POA: Diagnosis not present

## 2021-03-08 DIAGNOSIS — E1122 Type 2 diabetes mellitus with diabetic chronic kidney disease: Secondary | ICD-10-CM | POA: Diagnosis not present

## 2021-03-08 DIAGNOSIS — R4182 Altered mental status, unspecified: Secondary | ICD-10-CM | POA: Diagnosis not present

## 2021-03-08 DIAGNOSIS — G20 Parkinson's disease: Secondary | ICD-10-CM | POA: Diagnosis not present

## 2021-03-08 DIAGNOSIS — Z4789 Encounter for other orthopedic aftercare: Secondary | ICD-10-CM | POA: Diagnosis not present

## 2021-03-08 DIAGNOSIS — F411 Generalized anxiety disorder: Secondary | ICD-10-CM | POA: Diagnosis not present

## 2021-03-08 DIAGNOSIS — R278 Other lack of coordination: Secondary | ICD-10-CM | POA: Diagnosis not present

## 2021-03-08 DIAGNOSIS — T8452XD Infection and inflammatory reaction due to internal left hip prosthesis, subsequent encounter: Secondary | ICD-10-CM | POA: Diagnosis not present

## 2021-03-08 DIAGNOSIS — S72002D Fracture of unspecified part of neck of left femur, subsequent encounter for closed fracture with routine healing: Secondary | ICD-10-CM | POA: Diagnosis not present

## 2021-03-08 DIAGNOSIS — T8189XA Other complications of procedures, not elsewhere classified, initial encounter: Secondary | ICD-10-CM | POA: Diagnosis not present

## 2021-03-08 DIAGNOSIS — R2681 Unsteadiness on feet: Secondary | ICD-10-CM | POA: Diagnosis not present

## 2021-03-08 DIAGNOSIS — M869 Osteomyelitis, unspecified: Secondary | ICD-10-CM | POA: Diagnosis not present

## 2021-03-08 DIAGNOSIS — I2510 Atherosclerotic heart disease of native coronary artery without angina pectoris: Secondary | ICD-10-CM | POA: Diagnosis not present

## 2021-03-08 DIAGNOSIS — L97322 Non-pressure chronic ulcer of left ankle with fat layer exposed: Secondary | ICD-10-CM | POA: Diagnosis not present

## 2021-03-08 DIAGNOSIS — R197 Diarrhea, unspecified: Secondary | ICD-10-CM | POA: Diagnosis not present

## 2021-03-08 DIAGNOSIS — D509 Iron deficiency anemia, unspecified: Secondary | ICD-10-CM | POA: Diagnosis not present

## 2021-03-08 DIAGNOSIS — G4733 Obstructive sleep apnea (adult) (pediatric): Secondary | ICD-10-CM | POA: Diagnosis not present

## 2021-03-08 DIAGNOSIS — I714 Abdominal aortic aneurysm, without rupture, unspecified: Secondary | ICD-10-CM | POA: Diagnosis not present

## 2021-03-08 DIAGNOSIS — L03116 Cellulitis of left lower limb: Secondary | ICD-10-CM | POA: Diagnosis not present

## 2021-03-08 DIAGNOSIS — L97522 Non-pressure chronic ulcer of other part of left foot with fat layer exposed: Secondary | ICD-10-CM | POA: Diagnosis not present

## 2021-03-08 DIAGNOSIS — I480 Paroxysmal atrial fibrillation: Secondary | ICD-10-CM | POA: Diagnosis not present

## 2021-03-08 NOTE — Telephone Encounter (Signed)
Stephanie with [facility] called stating that left hip has healed and closed.  Stated that skin is irritated by the tape that was around the wound.  Would like instructions on what to do and new orders?  CB# [phone].  Please advise.  Thank you.

## 2021-03-09 NOTE — Telephone Encounter (Signed)
Spoke to Jason Hartman. Informed her she can change dressing type to a latex free tape or other adherent tape. She reported wound vac was removed in facility on Wednesday and dressing has remained dry since removal. Also noted, incision and wound look good and healed.

## 2021-03-09 NOTE — Telephone Encounter (Signed)
LMOM for [place]. No answer

## 2021-03-11 DIAGNOSIS — R2681 Unsteadiness on feet: Secondary | ICD-10-CM | POA: Diagnosis not present

## 2021-03-11 DIAGNOSIS — S72002D Fracture of unspecified part of neck of left femur, subsequent encounter for closed fracture with routine healing: Secondary | ICD-10-CM | POA: Diagnosis not present

## 2021-03-11 DIAGNOSIS — F411 Generalized anxiety disorder: Secondary | ICD-10-CM | POA: Diagnosis not present

## 2021-03-11 DIAGNOSIS — R4182 Altered mental status, unspecified: Secondary | ICD-10-CM | POA: Diagnosis not present

## 2021-03-11 DIAGNOSIS — T8452XD Infection and inflammatory reaction due to internal left hip prosthesis, subsequent encounter: Secondary | ICD-10-CM | POA: Diagnosis not present

## 2021-03-11 DIAGNOSIS — M6281 Muscle weakness (generalized): Secondary | ICD-10-CM | POA: Diagnosis not present

## 2021-03-12 ENCOUNTER — Telehealth: Payer: Self-pay | Admitting: Orthopaedic Surgery

## 2021-03-12 ENCOUNTER — Other Ambulatory Visit (HOSPITAL_COMMUNITY): Payer: Self-pay

## 2021-03-12 DIAGNOSIS — G20 Parkinson's disease: Secondary | ICD-10-CM | POA: Diagnosis not present

## 2021-03-12 DIAGNOSIS — S72002D Fracture of unspecified part of neck of left femur, subsequent encounter for closed fracture with routine healing: Secondary | ICD-10-CM | POA: Diagnosis not present

## 2021-03-12 DIAGNOSIS — I10 Essential (primary) hypertension: Secondary | ICD-10-CM | POA: Diagnosis not present

## 2021-03-12 DIAGNOSIS — R2681 Unsteadiness on feet: Secondary | ICD-10-CM | POA: Diagnosis not present

## 2021-03-12 DIAGNOSIS — T8452XD Infection and inflammatory reaction due to internal left hip prosthesis, subsequent encounter: Secondary | ICD-10-CM | POA: Diagnosis not present

## 2021-03-12 DIAGNOSIS — R4182 Altered mental status, unspecified: Secondary | ICD-10-CM | POA: Diagnosis not present

## 2021-03-12 DIAGNOSIS — F4323 Adjustment disorder with mixed anxiety and depressed mood: Secondary | ICD-10-CM | POA: Diagnosis not present

## 2021-03-12 DIAGNOSIS — M6281 Muscle weakness (generalized): Secondary | ICD-10-CM | POA: Diagnosis not present

## 2021-03-12 DIAGNOSIS — F411 Generalized anxiety disorder: Secondary | ICD-10-CM | POA: Diagnosis not present

## 2021-03-12 DIAGNOSIS — F5101 Primary insomnia: Secondary | ICD-10-CM | POA: Diagnosis not present

## 2021-03-12 NOTE — Telephone Encounter (Signed)
Dwyane Dee with Nursing facility called and would like to know what to do about sutures and wound vac.  CB [phone] ext [phone]

## 2021-03-12 NOTE — Telephone Encounter (Signed)
Left message on confidential voicemail stating facility can remove the sutures and place the wound vac back on and we will see pt 03/15/21 for F/u appt

## 2021-03-13 DIAGNOSIS — R2681 Unsteadiness on feet: Secondary | ICD-10-CM | POA: Diagnosis not present

## 2021-03-13 DIAGNOSIS — M6281 Muscle weakness (generalized): Secondary | ICD-10-CM | POA: Diagnosis not present

## 2021-03-13 DIAGNOSIS — R4182 Altered mental status, unspecified: Secondary | ICD-10-CM | POA: Diagnosis not present

## 2021-03-13 DIAGNOSIS — T8452XD Infection and inflammatory reaction due to internal left hip prosthesis, subsequent encounter: Secondary | ICD-10-CM | POA: Diagnosis not present

## 2021-03-13 DIAGNOSIS — F411 Generalized anxiety disorder: Secondary | ICD-10-CM | POA: Diagnosis not present

## 2021-03-13 DIAGNOSIS — S72002D Fracture of unspecified part of neck of left femur, subsequent encounter for closed fracture with routine healing: Secondary | ICD-10-CM | POA: Diagnosis not present

## 2021-03-13 LAB — ACID FAST CULTURE WITH REFLEXED SENSITIVITIES (MYCOBACTERIA)
Acid Fast Culture: NEGATIVE
Acid Fast Culture: NEGATIVE
Acid Fast Culture: NEGATIVE

## 2021-03-14 ENCOUNTER — Emergency Department (HOSPITAL_COMMUNITY)
Admission: EM | Admit: 2021-03-14 | Discharge: 2021-03-15 | Disposition: A | Discharge status: Home / Self Care | Payer: Medicare Other | Attending: Emergency Medicine | Admitting: Emergency Medicine

## 2021-03-14 ENCOUNTER — Emergency Department (HOSPITAL_COMMUNITY): Payer: Medicare Other

## 2021-03-14 ENCOUNTER — Other Ambulatory Visit: Payer: Self-pay

## 2021-03-14 ENCOUNTER — Other Ambulatory Visit (HOSPITAL_COMMUNITY): Payer: Self-pay

## 2021-03-14 ENCOUNTER — Other Ambulatory Visit: Payer: Self-pay | Admitting: *Deleted

## 2021-03-14 ENCOUNTER — Encounter (HOSPITAL_COMMUNITY): Payer: Self-pay | Admitting: Emergency Medicine

## 2021-03-14 DIAGNOSIS — I2510 Atherosclerotic heart disease of native coronary artery without angina pectoris: Secondary | ICD-10-CM | POA: Diagnosis not present

## 2021-03-14 DIAGNOSIS — G20 Parkinson's disease: Secondary | ICD-10-CM | POA: Diagnosis not present

## 2021-03-14 DIAGNOSIS — Z79899 Other long term (current) drug therapy: Secondary | ICD-10-CM | POA: Diagnosis not present

## 2021-03-14 DIAGNOSIS — T8142XA Infection following a procedure, deep incisional surgical site, initial encounter: Secondary | ICD-10-CM | POA: Diagnosis not present

## 2021-03-14 DIAGNOSIS — I129 Hypertensive chronic kidney disease with stage 1 through stage 4 chronic kidney disease, or unspecified chronic kidney disease: Secondary | ICD-10-CM | POA: Insufficient documentation

## 2021-03-14 DIAGNOSIS — T8452XD Infection and inflammatory reaction due to internal left hip prosthesis, subsequent encounter: Secondary | ICD-10-CM | POA: Diagnosis not present

## 2021-03-14 DIAGNOSIS — M25561 Pain in right knee: Secondary | ICD-10-CM | POA: Diagnosis not present

## 2021-03-14 DIAGNOSIS — Z043 Encounter for examination and observation following other accident: Secondary | ICD-10-CM | POA: Diagnosis not present

## 2021-03-14 DIAGNOSIS — F0390 Unspecified dementia without behavioral disturbance: Secondary | ICD-10-CM

## 2021-03-14 DIAGNOSIS — W19XXXA Unspecified fall, initial encounter: Secondary | ICD-10-CM

## 2021-03-14 DIAGNOSIS — R0902 Hypoxemia: Secondary | ICD-10-CM | POA: Diagnosis not present

## 2021-03-14 DIAGNOSIS — N1831 Chronic kidney disease, stage 3a: Secondary | ICD-10-CM | POA: Diagnosis not present

## 2021-03-14 DIAGNOSIS — Z7901 Long term (current) use of anticoagulants: Secondary | ICD-10-CM | POA: Diagnosis not present

## 2021-03-14 DIAGNOSIS — E1122 Type 2 diabetes mellitus with diabetic chronic kidney disease: Secondary | ICD-10-CM | POA: Diagnosis not present

## 2021-03-14 DIAGNOSIS — S0990XA Unspecified injury of head, initial encounter: Secondary | ICD-10-CM | POA: Diagnosis not present

## 2021-03-14 DIAGNOSIS — W06XXXA Fall from bed, initial encounter: Secondary | ICD-10-CM | POA: Diagnosis not present

## 2021-03-14 DIAGNOSIS — I6523 Occlusion and stenosis of bilateral carotid arteries: Secondary | ICD-10-CM | POA: Diagnosis not present

## 2021-03-14 DIAGNOSIS — S80919A Unspecified superficial injury of unspecified knee, initial encounter: Secondary | ICD-10-CM | POA: Diagnosis not present

## 2021-03-14 DIAGNOSIS — J690 Pneumonitis due to inhalation of food and vomit: Secondary | ICD-10-CM | POA: Diagnosis not present

## 2021-03-14 DIAGNOSIS — R059 Cough, unspecified: Secondary | ICD-10-CM | POA: Diagnosis not present

## 2021-03-14 DIAGNOSIS — Z981 Arthrodesis status: Secondary | ICD-10-CM | POA: Diagnosis not present

## 2021-03-14 DIAGNOSIS — Z87891 Personal history of nicotine dependence: Secondary | ICD-10-CM | POA: Diagnosis not present

## 2021-03-14 DIAGNOSIS — R4182 Altered mental status, unspecified: Secondary | ICD-10-CM | POA: Diagnosis not present

## 2021-03-14 DIAGNOSIS — F411 Generalized anxiety disorder: Secondary | ICD-10-CM | POA: Diagnosis not present

## 2021-03-14 DIAGNOSIS — A0472 Enterocolitis due to Clostridium difficile, not specified as recurrent: Secondary | ICD-10-CM | POA: Diagnosis not present

## 2021-03-14 DIAGNOSIS — S8002XA Contusion of left knee, initial encounter: Secondary | ICD-10-CM | POA: Diagnosis not present

## 2021-03-14 DIAGNOSIS — R296 Repeated falls: Secondary | ICD-10-CM

## 2021-03-14 DIAGNOSIS — M2578 Osteophyte, vertebrae: Secondary | ICD-10-CM | POA: Diagnosis not present

## 2021-03-14 DIAGNOSIS — S72002D Fracture of unspecified part of neck of left femur, subsequent encounter for closed fracture with routine healing: Secondary | ICD-10-CM | POA: Diagnosis not present

## 2021-03-14 DIAGNOSIS — M79605 Pain in left leg: Secondary | ICD-10-CM | POA: Diagnosis not present

## 2021-03-14 DIAGNOSIS — Z96642 Presence of left artificial hip joint: Secondary | ICD-10-CM | POA: Diagnosis not present

## 2021-03-14 DIAGNOSIS — M6281 Muscle weakness (generalized): Secondary | ICD-10-CM | POA: Diagnosis not present

## 2021-03-14 DIAGNOSIS — Z7984 Long term (current) use of oral hypoglycemic drugs: Secondary | ICD-10-CM | POA: Insufficient documentation

## 2021-03-14 DIAGNOSIS — I672 Cerebral atherosclerosis: Secondary | ICD-10-CM | POA: Diagnosis not present

## 2021-03-14 DIAGNOSIS — R2681 Unsteadiness on feet: Secondary | ICD-10-CM | POA: Diagnosis not present

## 2021-03-14 IMAGING — CR DG CHEST 1V
1 series · 1 of 1 positions shown · non-contrast
Comparison: Chest x-ray [DATE].  CT chest [DATE].

CLINICAL DATA: Fall.

EXAM:
CHEST  1 VIEW

[chest ap]
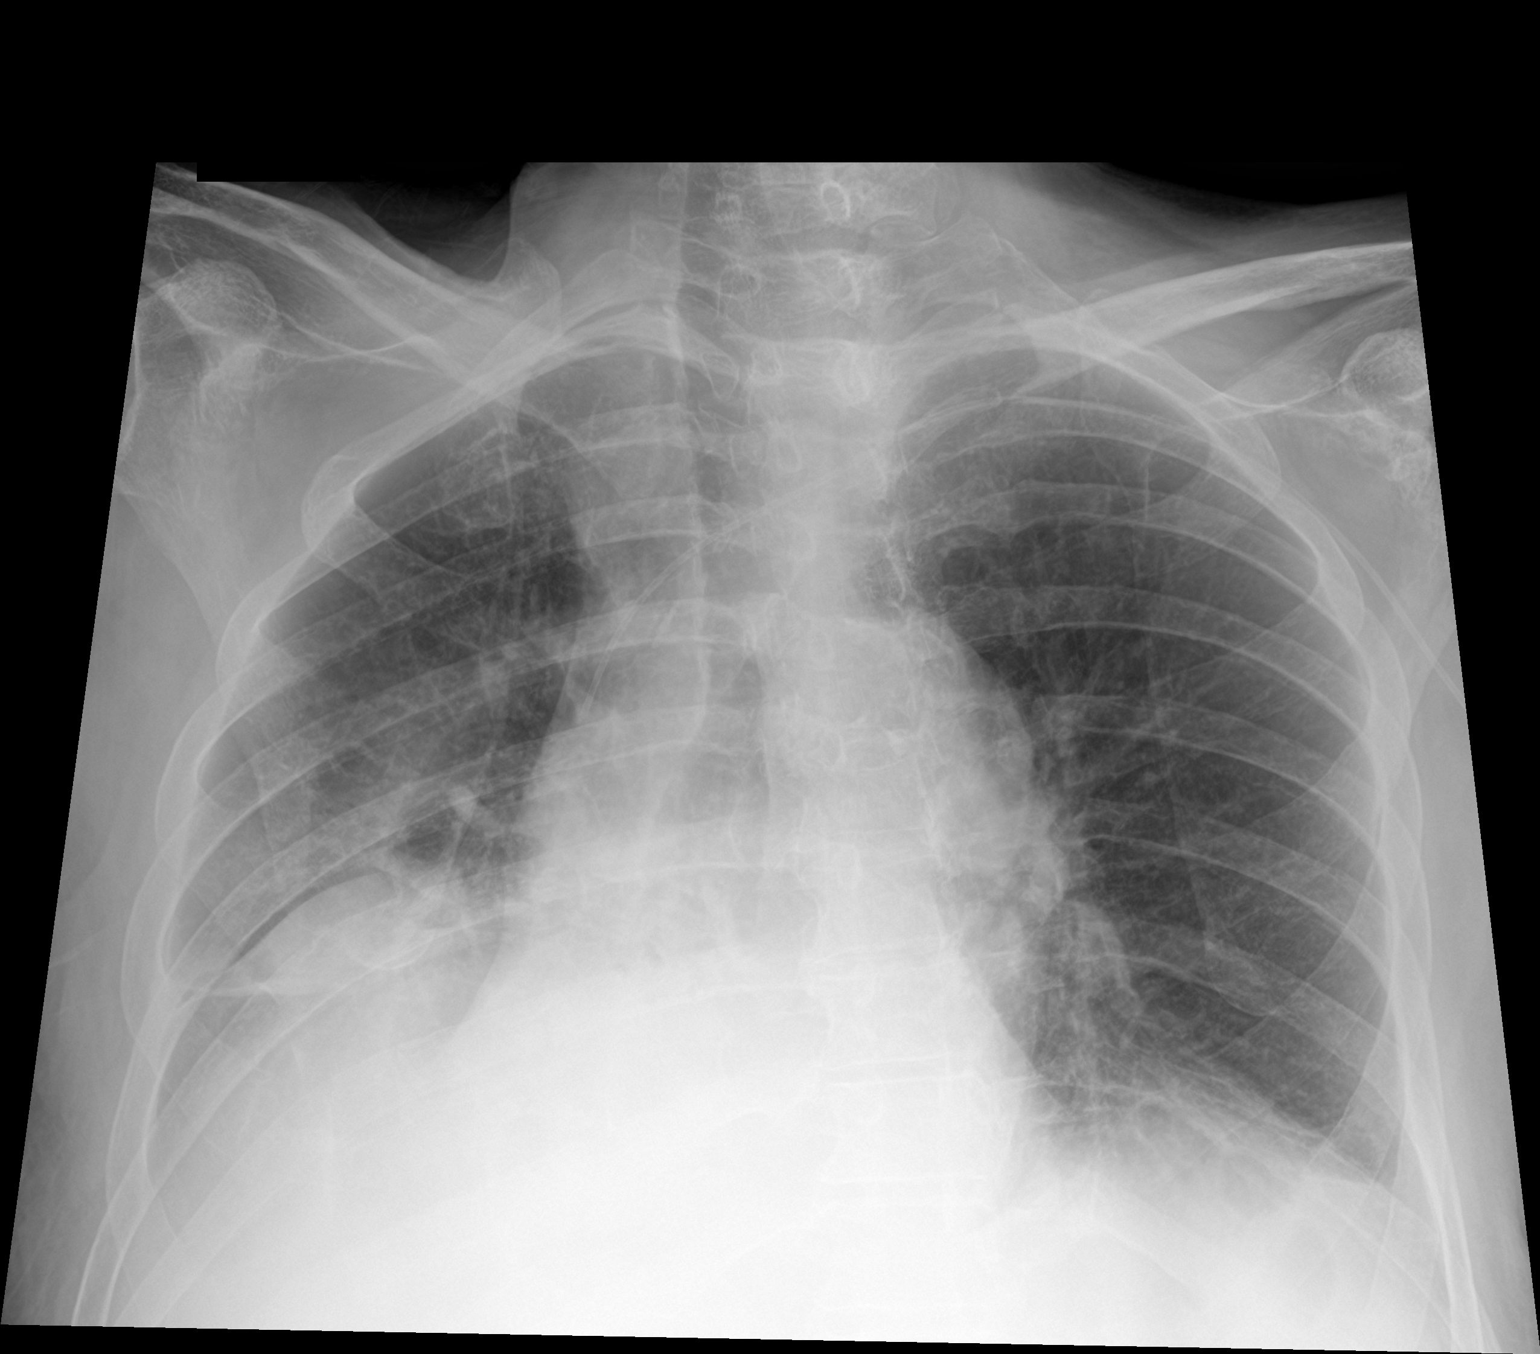

[1 of 1 positions shown; findings below may reference images not displayed]

FINDINGS: Left-sided central venous catheter tip projects over the proximal
SVC. The cardiomediastinal silhouette is within normal limits for
projection. There is stable mild elevation of the right
hemidiaphragm. There is no definite focal lung consolidation,
pleural effusion or pneumothorax. No acute fractures are seen.
IMPRESSION: 1. No acute cardiopulmonary process.

## 2021-03-14 IMAGING — CT CT CERVICAL SPINE W/O CM
3 of 4 series · 13 of 33 positions shown, 16 images · non-contrast
Comparison: [DATE], [DATE].

CLINICAL DATA: Neck trauma, fall out of bed.

EXAM:
CT HEAD WITHOUT CONTRAST
CT CERVICAL SPINE WITHOUT CONTRAST
TECHNIQUE: Multidetector CT imaging of the head and cervical spine was
performed following the standard protocol without intravenous
contrast. Multiplanar CT image reconstructions of the cervical spine
were also generated.

[Series 5: orthogonal axial st · axial · 0.21mm/px · z∈[-314,-218]mm · 5 of 81 slices shown, 7 images]
[im 14/81  soft-tissue]
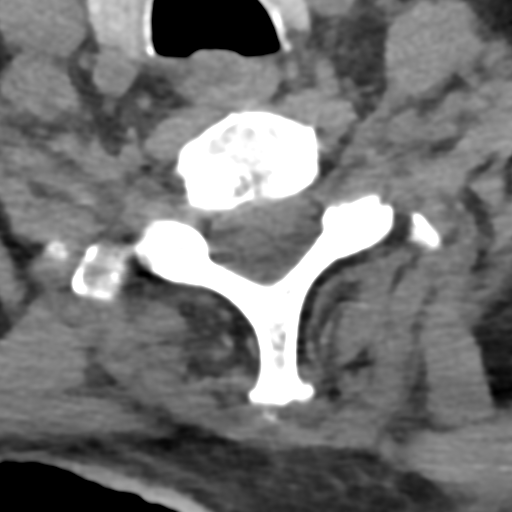
[im 14/81  bone]
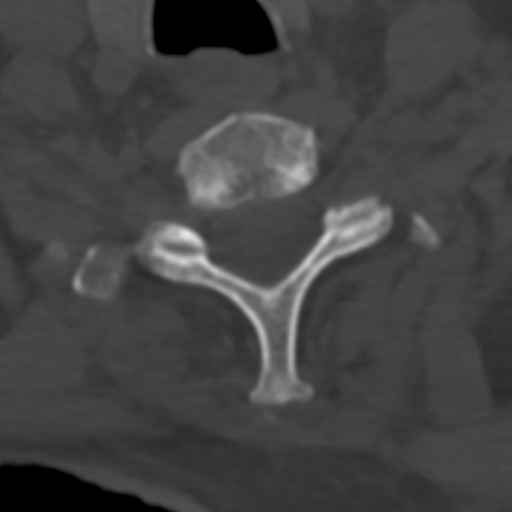
[im 27/81  bone]
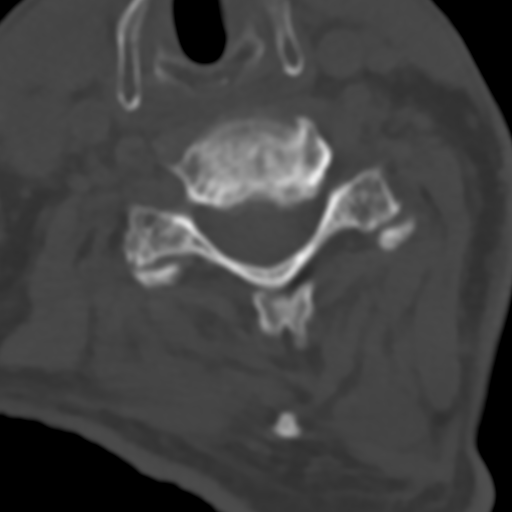
[im 41/81  bone]
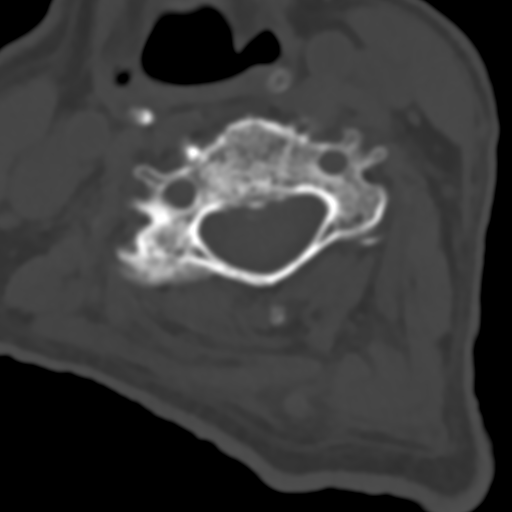
[im 54/81  bone]
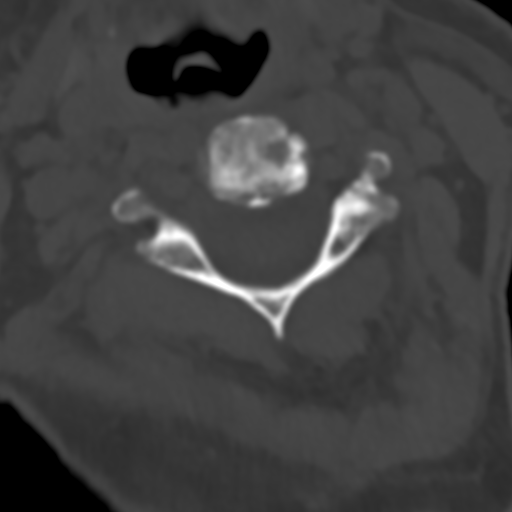
[im 67/81  soft-tissue]
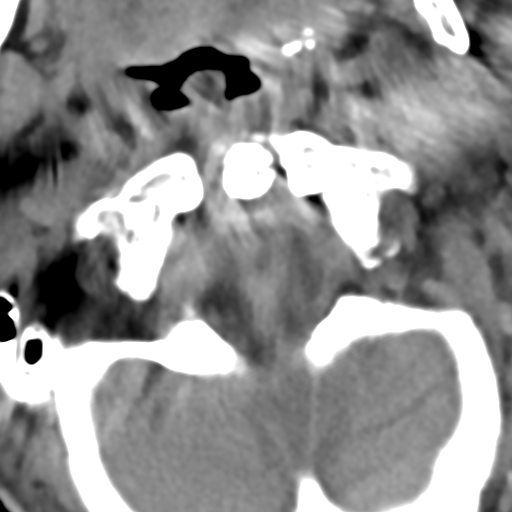
[im 67/81  bone]
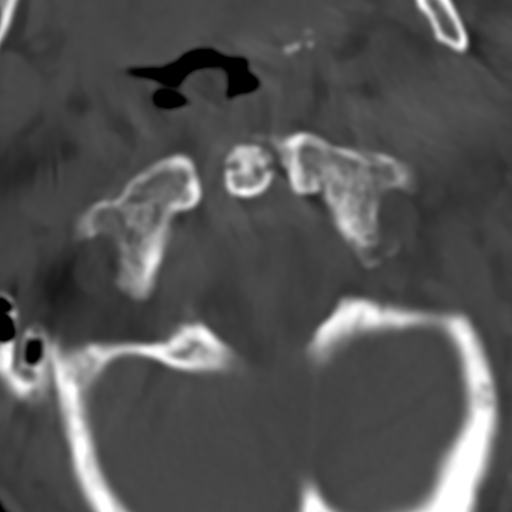

[Series 10: coronal bone · coronal · 0.29mm/px · 3 of 77 slices shown]
[im 16/77  bone]
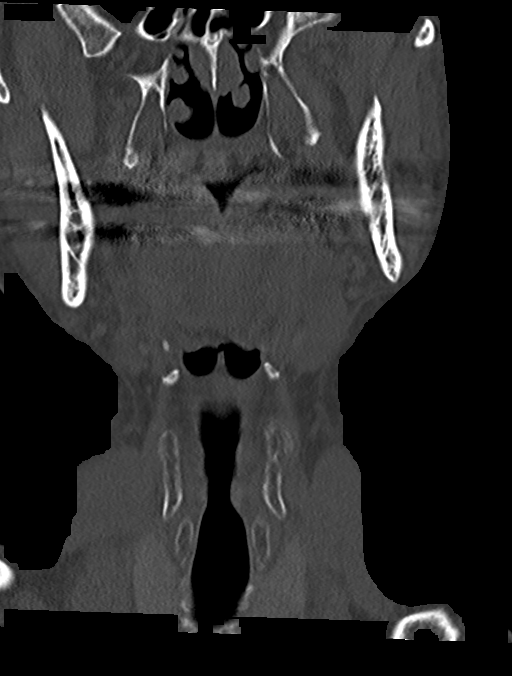
[im 31/77  bone]
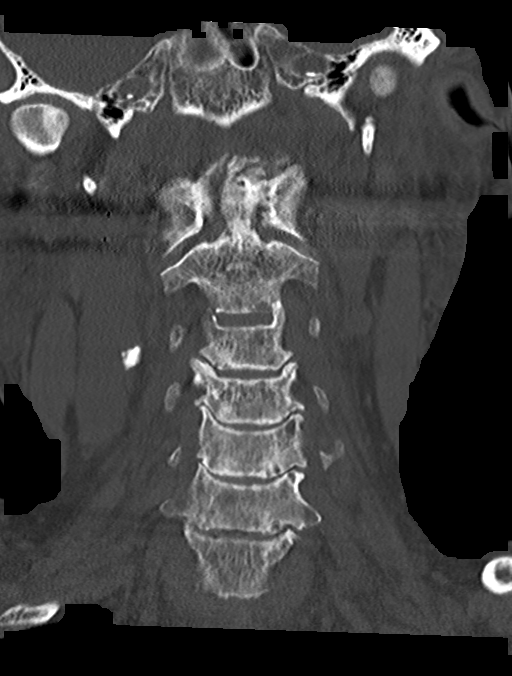
[im 46/77  bone]
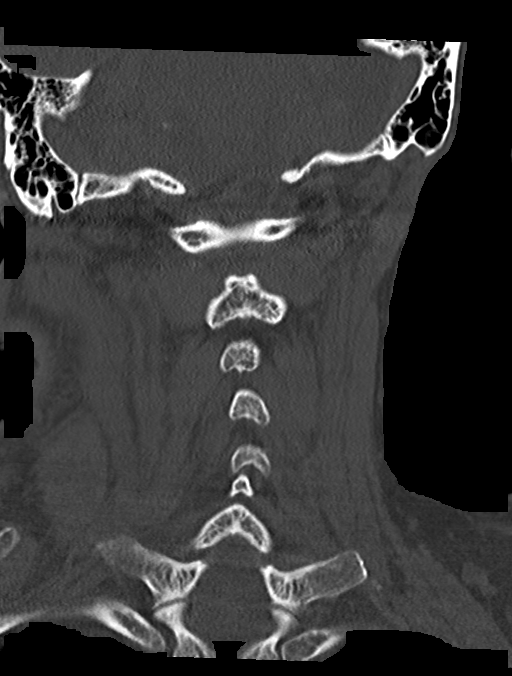

[Series 11: sagittal bone · sagittal · 0.30mm/px · 5 of 50 slices shown, 6 images]
[im 17/50  bone]
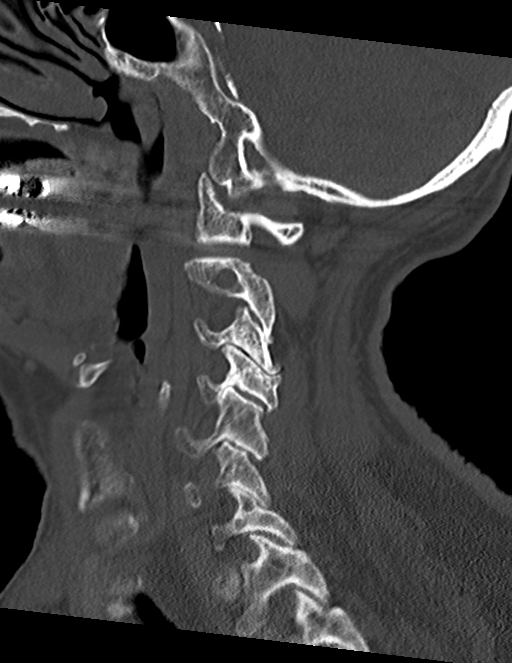
[im 21/50  bone]
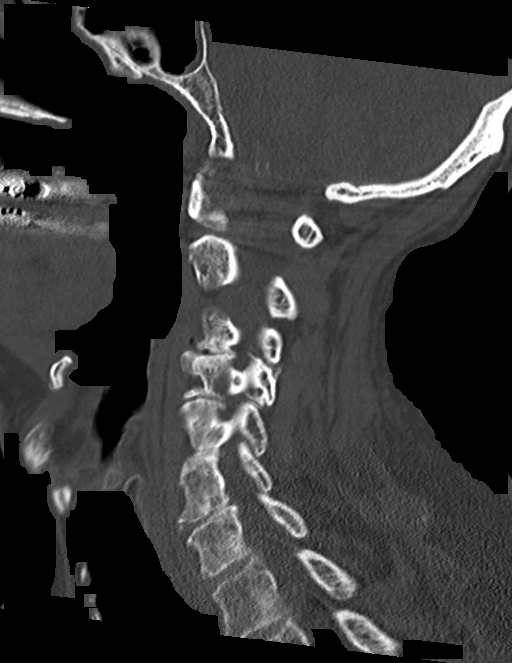
[im 25/50  soft-tissue]
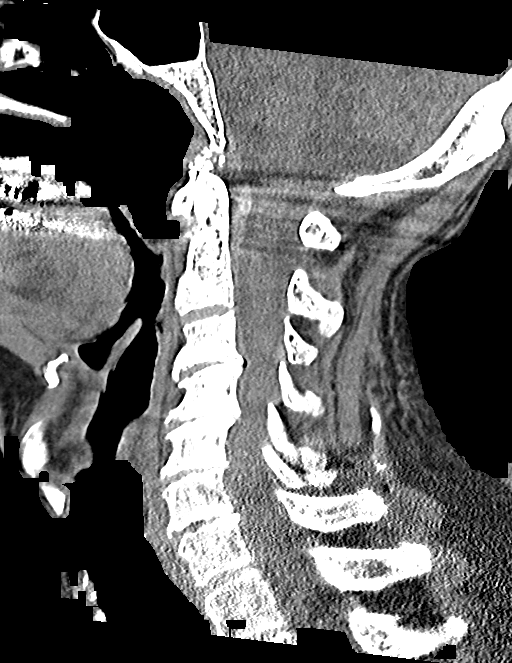
[im 25/50  bone]
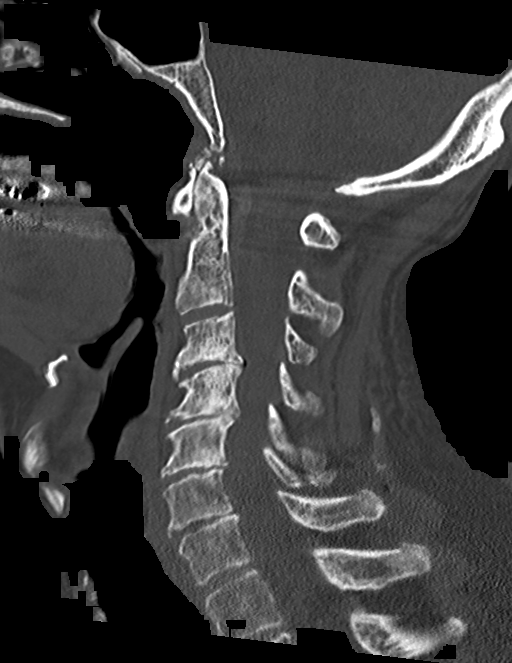
[im 29/50  bone]
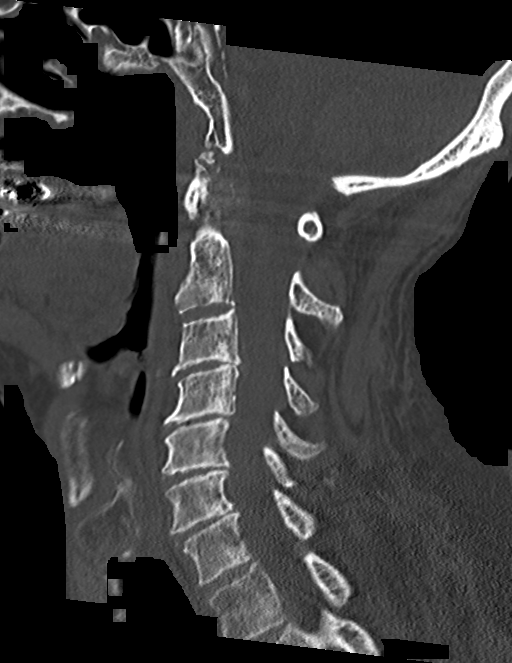
[im 33/50  bone]
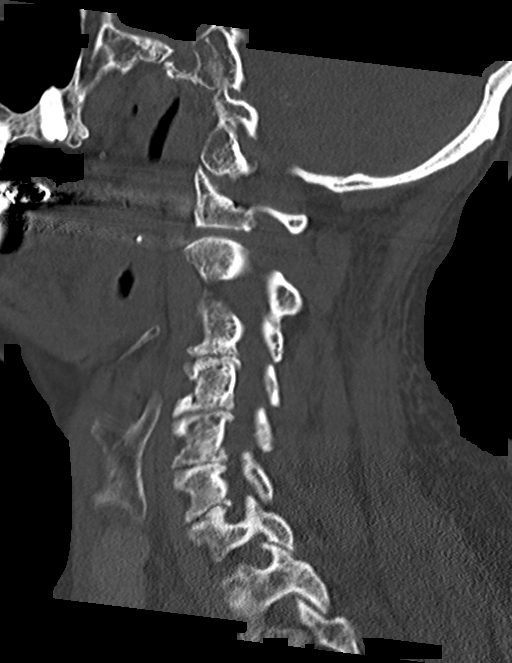

[13 of 33 positions shown; findings below may reference images not displayed]

FINDINGS: CT HEAD FINDINGS

Brain: No acute intracranial hemorrhage, midline shift or mass
effect. No extra-axial fluid collection is seen. There is diffuse
generalized atrophy. Subcortical and periventricular white matter
hypodensities are noted bilaterally. There is no hydrocephalus.
There are old lacunar infarcts in the basal ganglia bilaterally and
in the corona radiata on the right.

Vascular: Atherosclerotic calcification of the carotid siphons and
vertebral arteries. No hyperdense vessel

Skull: Normal. Negative for fracture or focal lesion.

Sinuses/Orbits: There is partial opacification of the sphenoid sinus
on the right. The orbits are within normal limits.

Other: None.

CT CERVICAL SPINE FINDINGS

Alignment: Normal.

Skull base and vertebrae: No acute fracture. No primary bone lesion
or focal pathologic process.

Soft tissues and spinal canal: No prevertebral fluid or swelling. No
visible canal hematoma.

Disc levels: Multilevel intervertebral disc space narrowing, disc
herniations, osteophyte formation, and facet arthropathy is present
resulting in mild-to-moderate spinal canal stenosis. There is fusion
of the C2-C3 facet on the left.

Upper chest: Negative.

Other: Atherosclerotic calcification of the carotid arteries is
noted.
IMPRESSION: 1. No acute intracranial hemorrhage.
2. Atrophy with chronic microvascular ischemic changes and old
lacunar infarcts bilaterally.
3. Stable degenerative changes in the cervical spine without
evidence of acute fracture.

## 2021-03-14 IMAGING — CR DG ANKLE COMPLETE 3+V*L*
3 series · 3 of 3 positions shown · non-contrast
Comparison: None.

CLINICAL DATA: Unwitnessed fall, dementia

EXAM:
LEFT ANKLE COMPLETE - 3+ VIEW

[ankle obl]
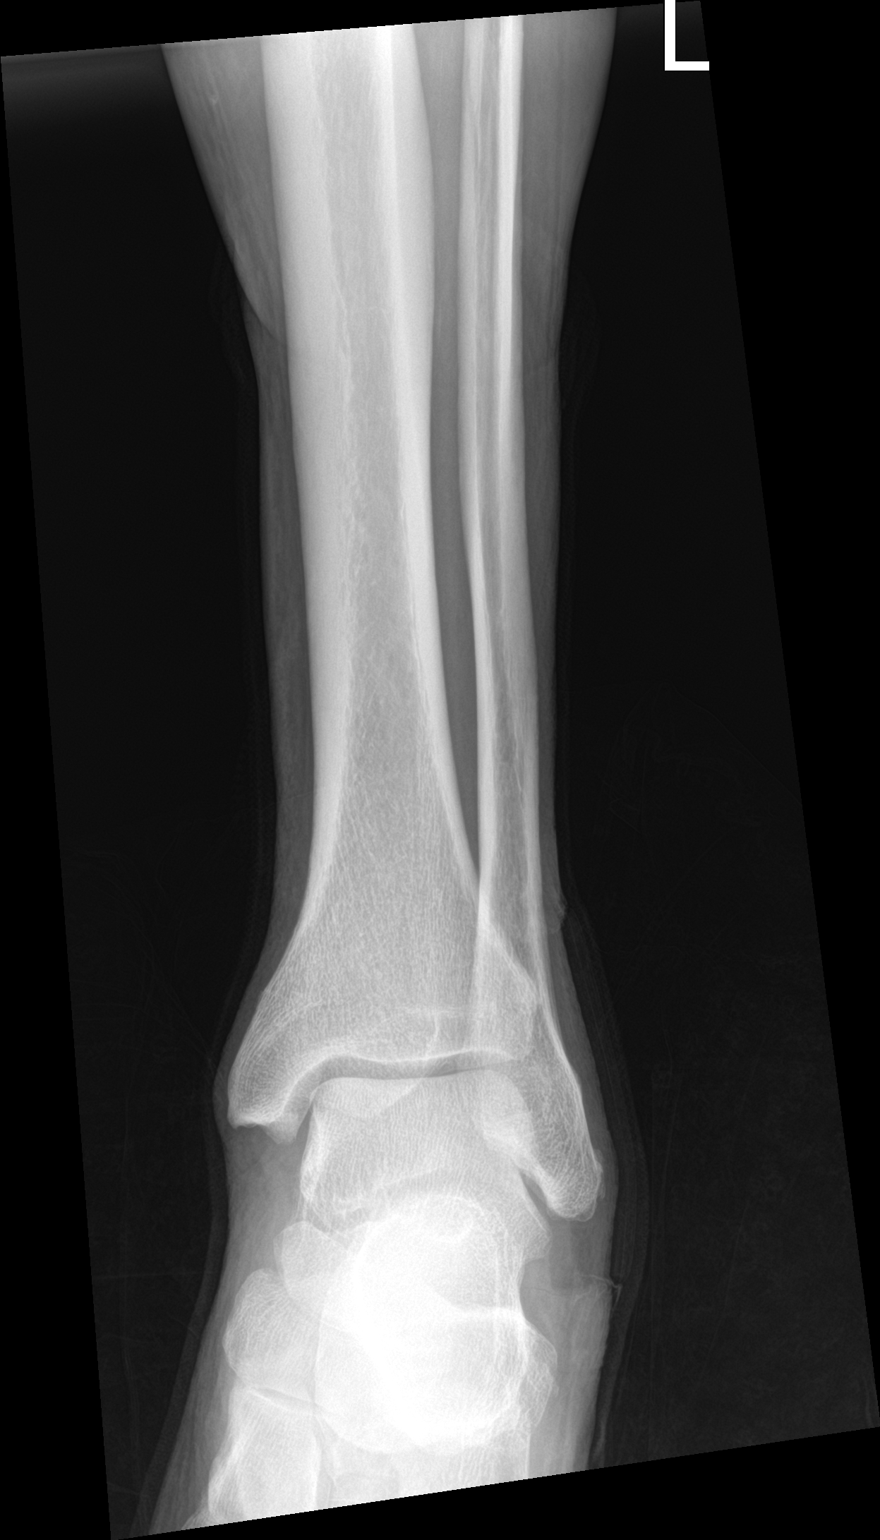

[ankle lat]
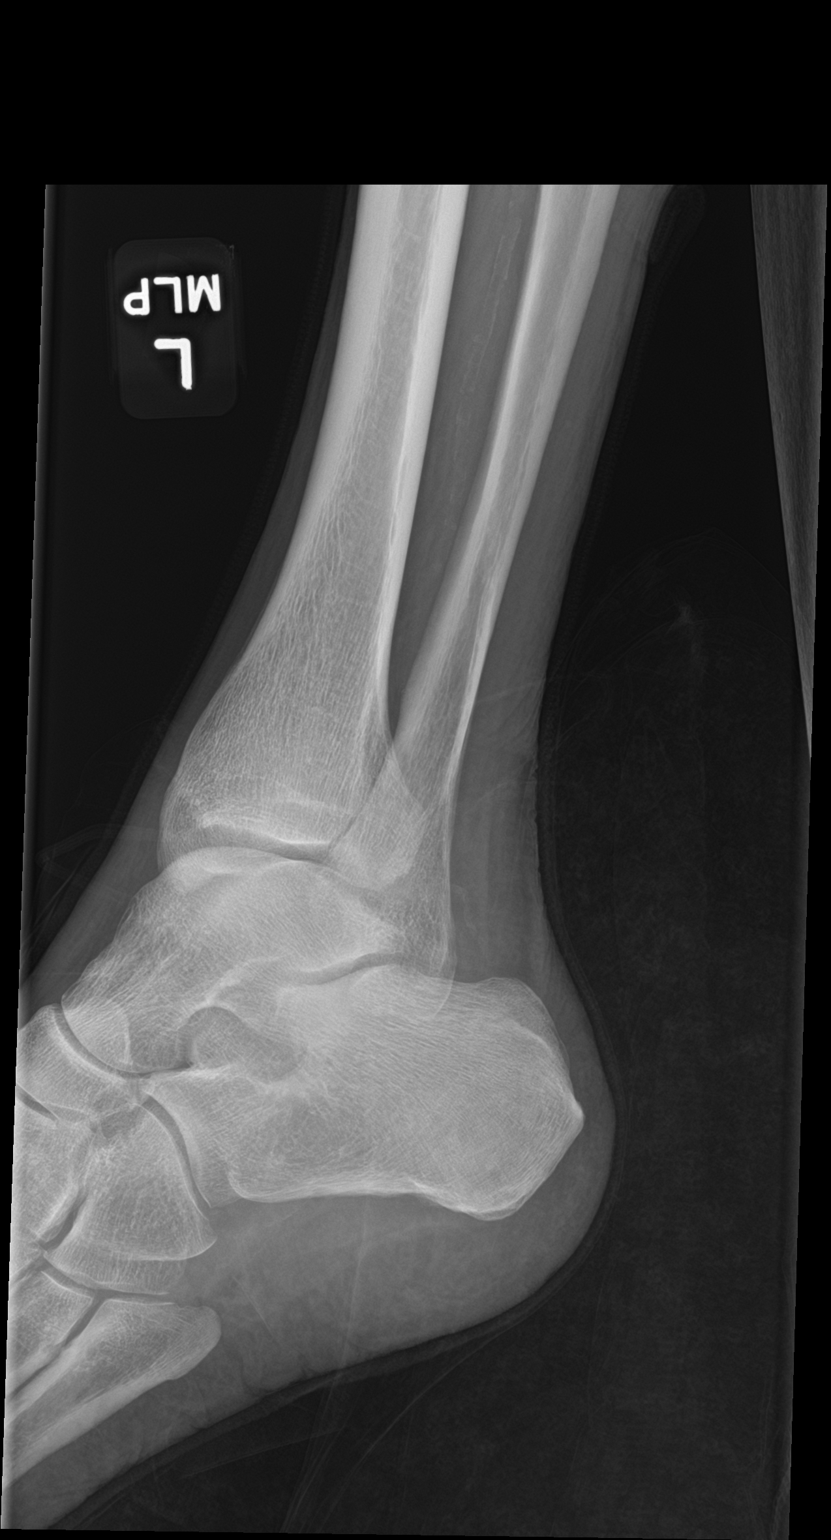

[ankle ap]
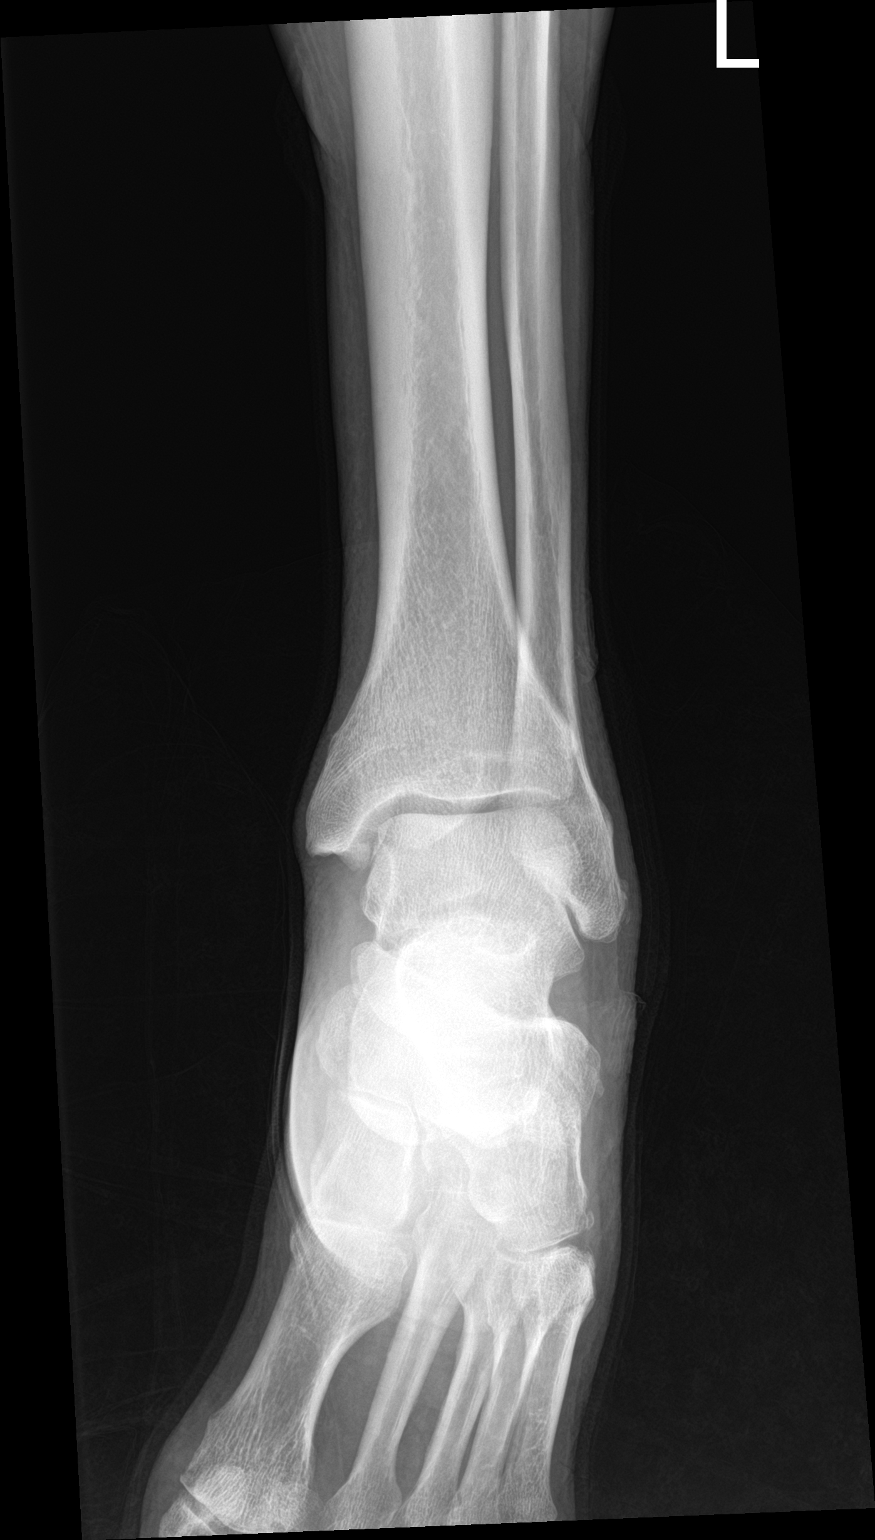

[3 of 3 positions shown; findings below may reference images not displayed]

FINDINGS: There is no evidence of fracture, dislocation, or joint effusion.
There is no evidence of arthropathy or other focal bone abnormality.
Soft tissues are unremarkable.
IMPRESSION: Negative.

## 2021-03-14 IMAGING — CR DG PELVIS 1-2V
2 series · 2 of 2 positions shown · non-contrast
Comparison: Pelvis x-ray [DATE].

CLINICAL DATA: Unwitnessed fall, dementia.

EXAM:
PELVIS - 1-2 VIEW

[pelvis ap (1 of 2)]
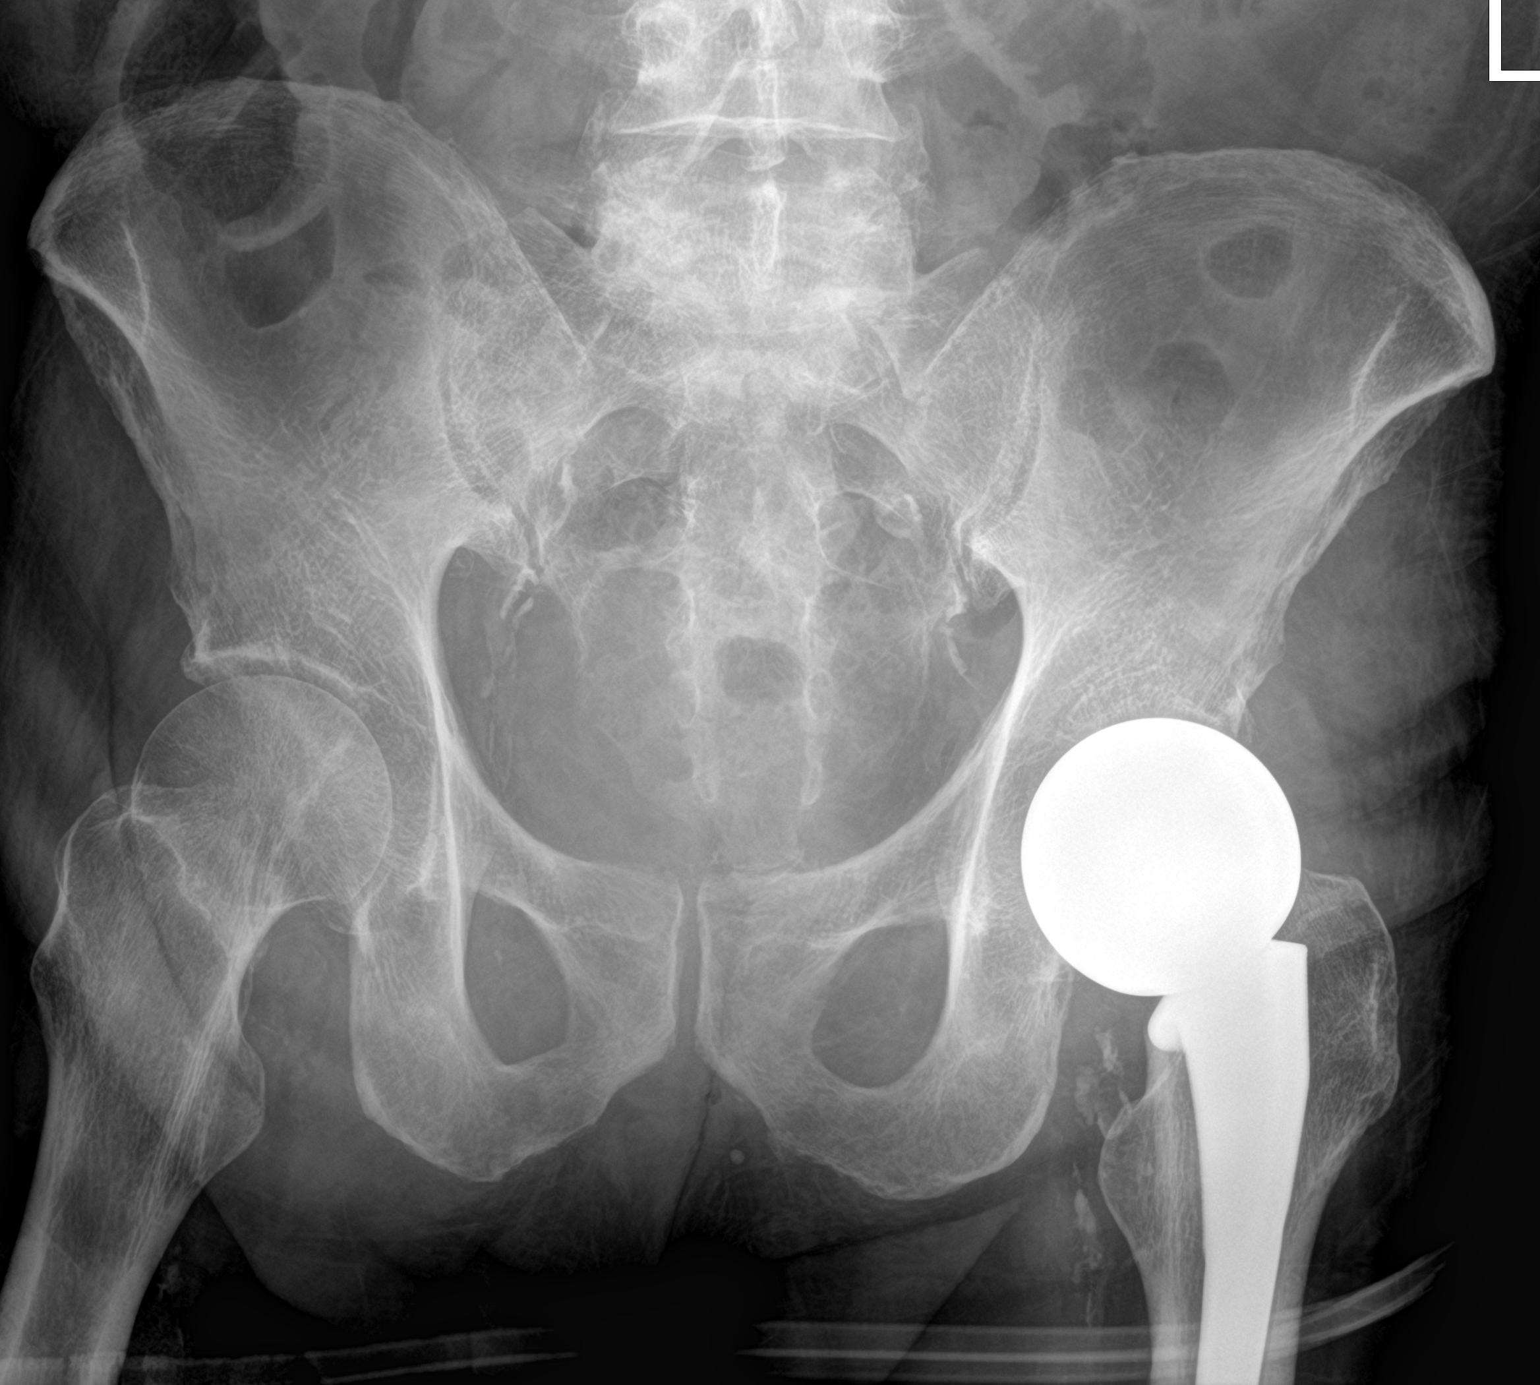

[pelvis ap (2 of 2)]
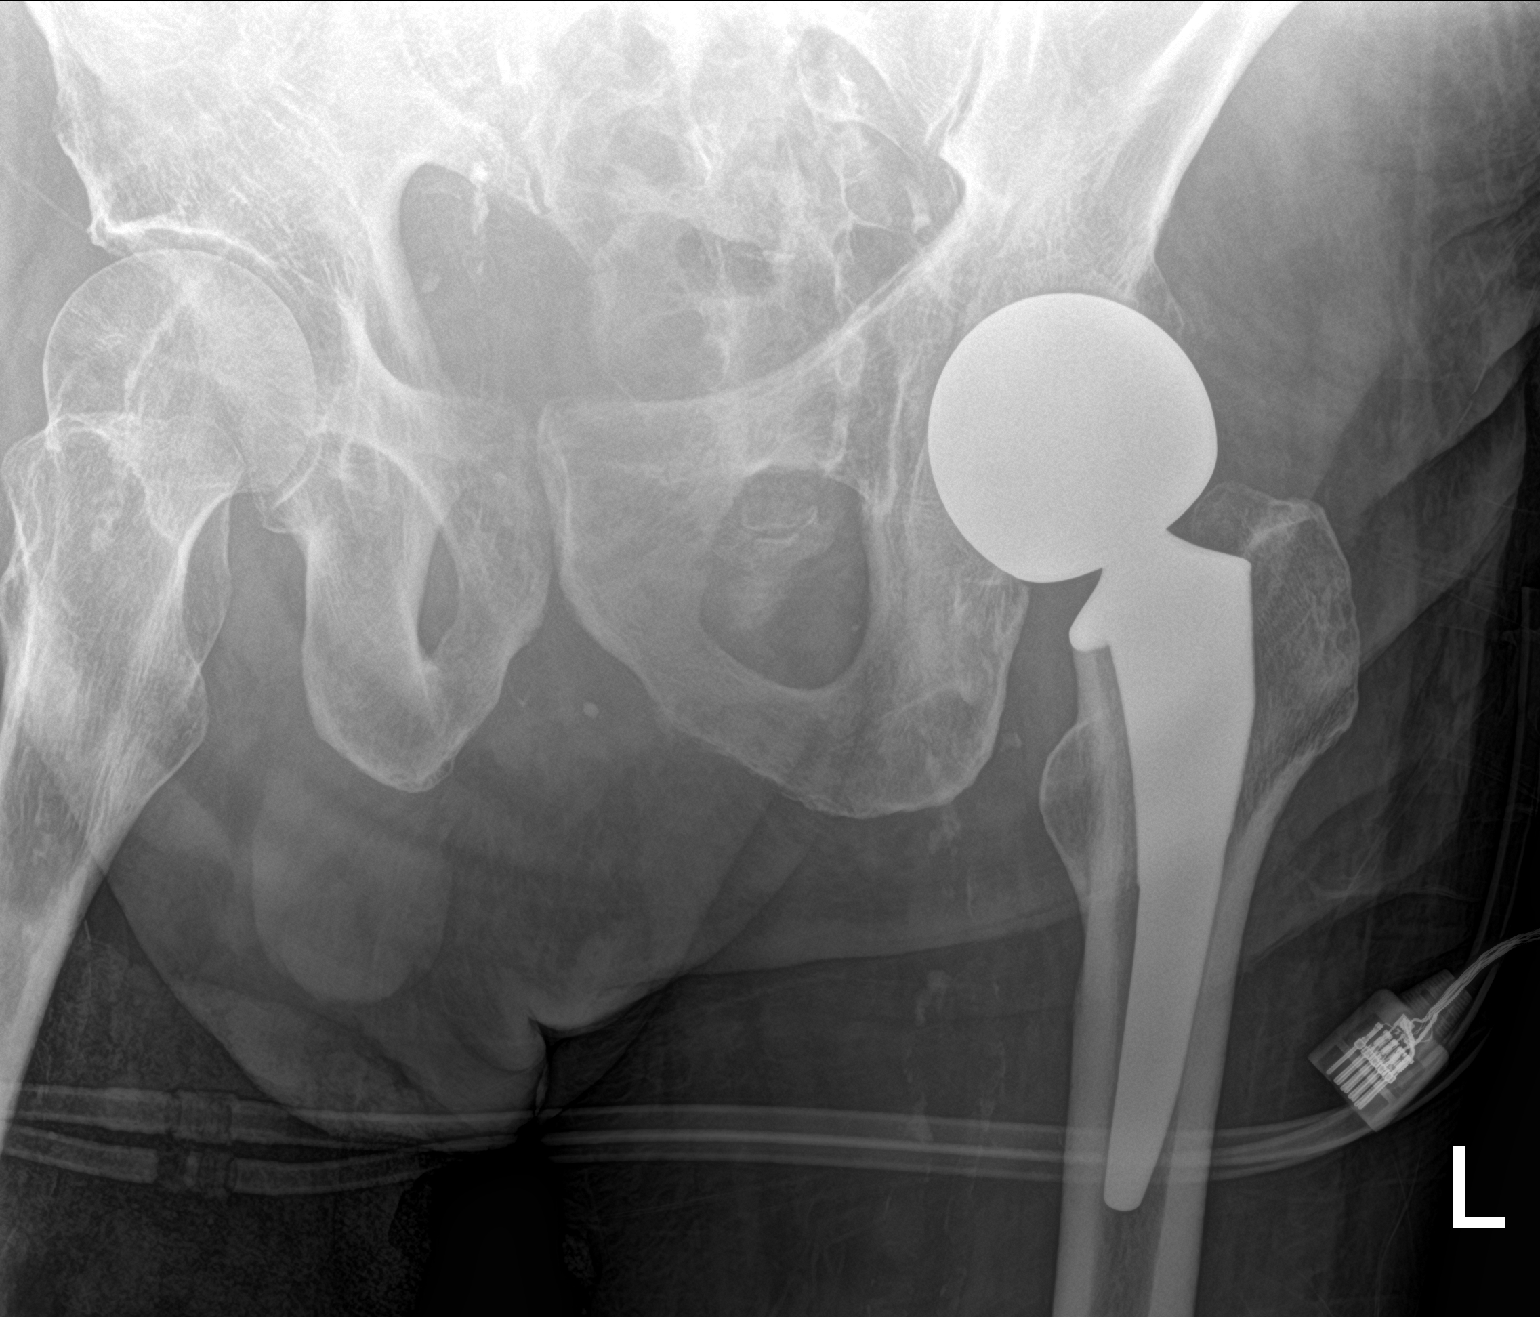

[2 of 2 positions shown; findings below may reference images not displayed]

FINDINGS: Left hip arthroplasty appears in anatomic alignment. There is no
evidence for hardware loosening or acute fracture. There is no
dislocation. Joint spaces are maintained.
IMPRESSION: 1. No acute fracture or dislocation.
2. Left hip arthroplasty in anatomic alignment.

## 2021-03-14 IMAGING — CT CT HEAD W/O CM
3 series · 14 of 47 positions shown, 16 images · non-contrast
Comparison: [DATE], [DATE].

CLINICAL DATA: Neck trauma, fall out of bed.

EXAM:
CT HEAD WITHOUT CONTRAST
CT CERVICAL SPINE WITHOUT CONTRAST
TECHNIQUE: Multidetector CT imaging of the head and cervical spine was
performed following the standard protocol without intravenous
contrast. Multiplanar CT image reconstructions of the cervical spine
were also generated.

[Series 4: head 5.0 h30s · axial · 0.45mm/px · z∈[-166,-16]mm · 8 of 36 slices shown, 10 images]
[im 3/36  brain]
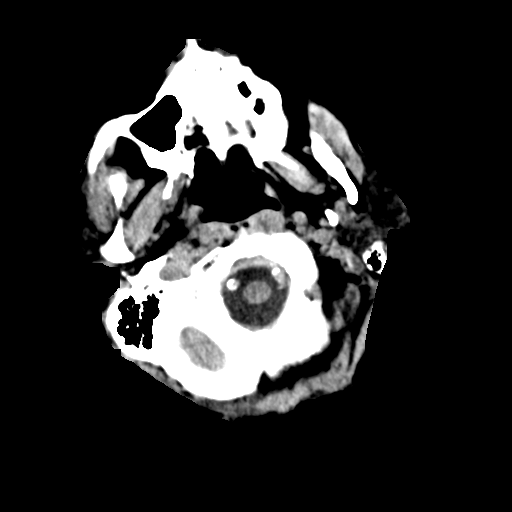
[im 3/36  bone]
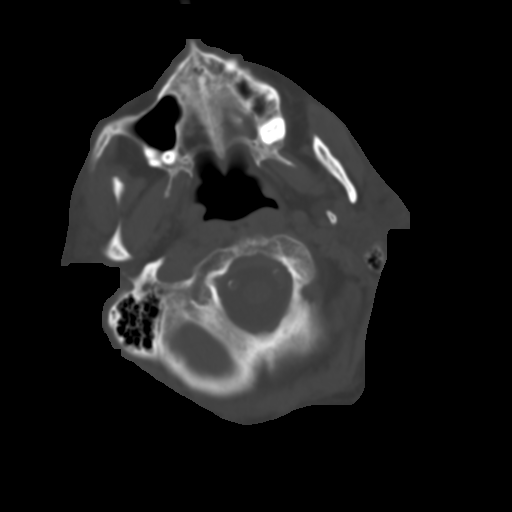
[im 8/36  brain]
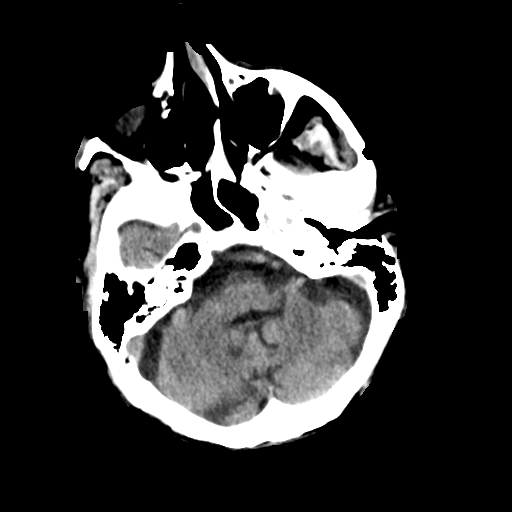
[im 11/36  brain]
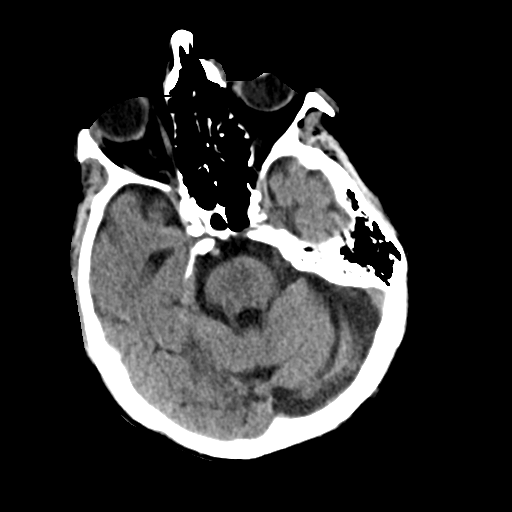
[im 16/36  brain]
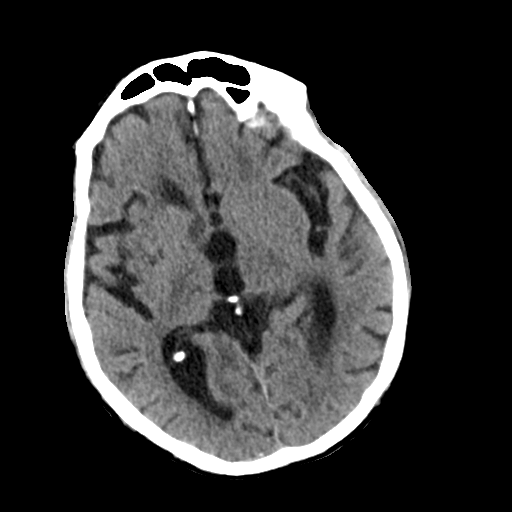
[im 20/36  brain]
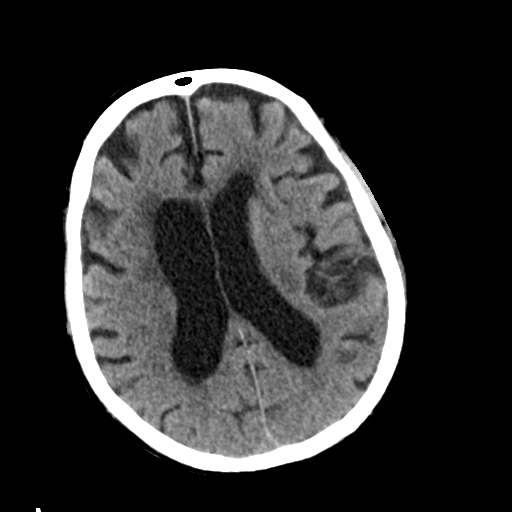
[im 20/36  bone]
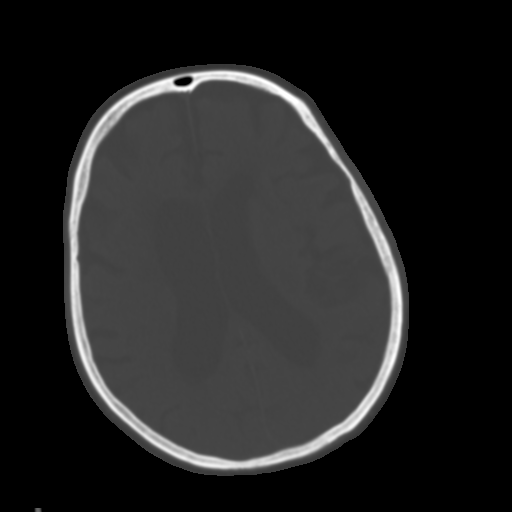
[im 25/36  brain]
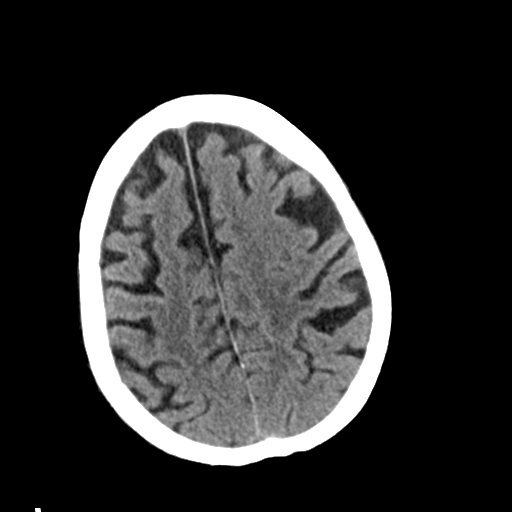
[im 28/36  brain]
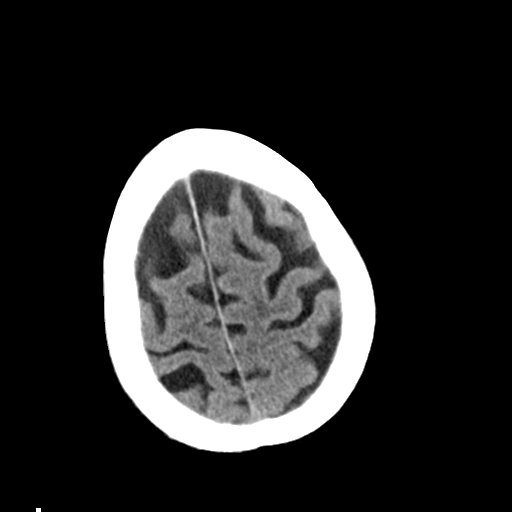
[im 33/36  brain]
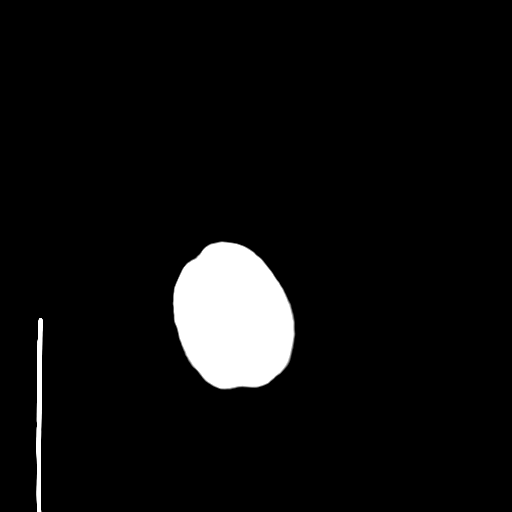

[Series 5: head 3.0 mpr cor · coronal · 0.34mm/px · 3 of 68 slices shown]
[im 23/68  brain]
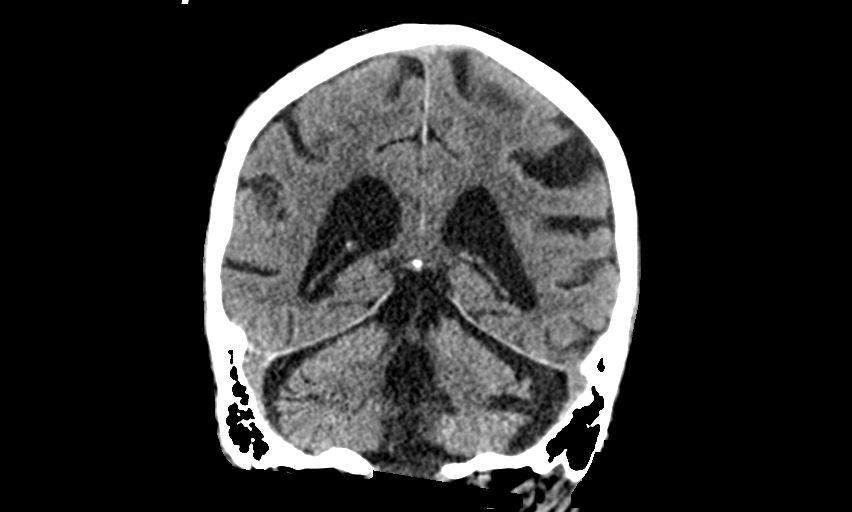
[im 30/68  brain]
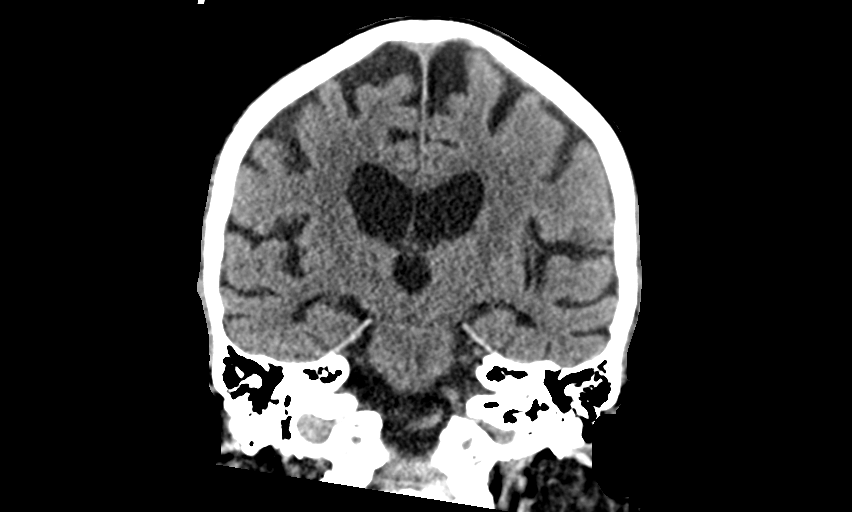
[im 38/68  brain]
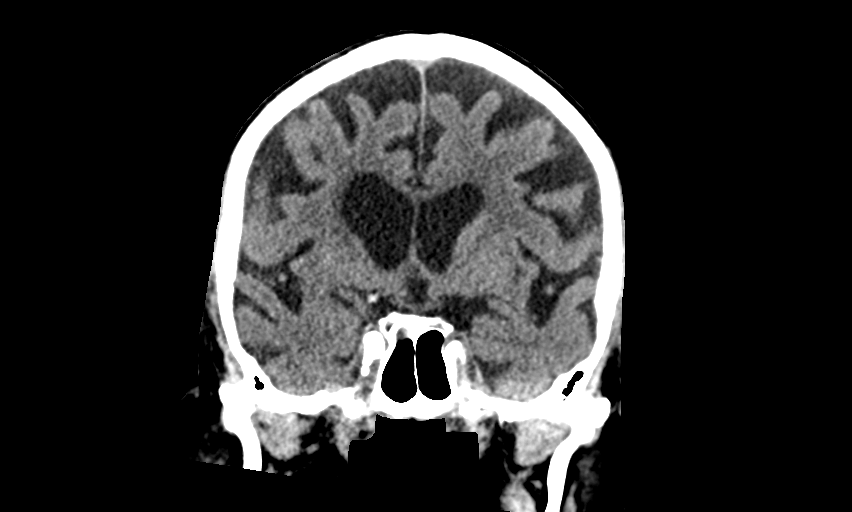

[Series 6: head 3.0 mpr sag · sagittal · 0.34mm/px · 3 of 58 slices shown]
[im 23/58  brain]
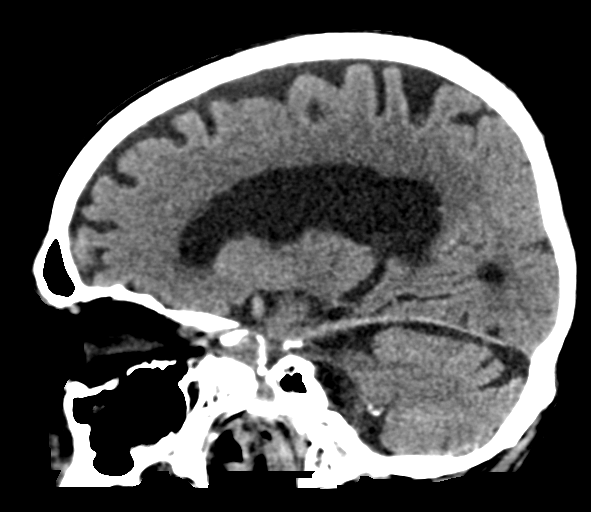
[im 29/58  brain]
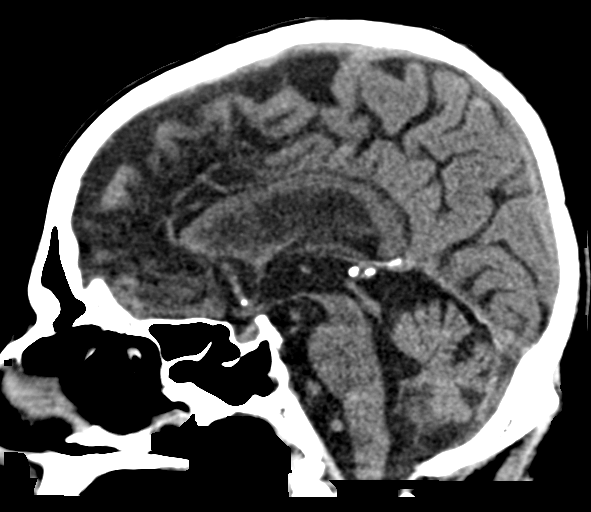
[im 35/58  brain]
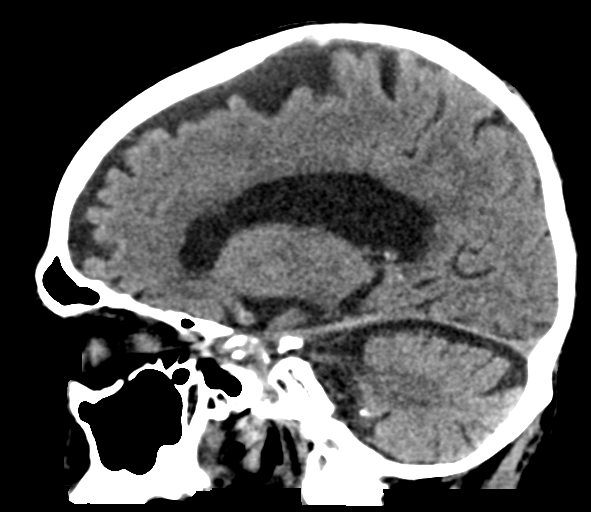

[14 of 47 positions shown; findings below may reference images not displayed]

FINDINGS: CT HEAD FINDINGS

Brain: No acute intracranial hemorrhage, midline shift or mass
effect. No extra-axial fluid collection is seen. There is diffuse
generalized atrophy. Subcortical and periventricular white matter
hypodensities are noted bilaterally. There is no hydrocephalus.
There are old lacunar infarcts in the basal ganglia bilaterally and
in the corona radiata on the right.

Vascular: Atherosclerotic calcification of the carotid siphons and
vertebral arteries. No hyperdense vessel

Skull: Normal. Negative for fracture or focal lesion.

Sinuses/Orbits: There is partial opacification of the sphenoid sinus
on the right. The orbits are within normal limits.

Other: None.

CT CERVICAL SPINE FINDINGS

Alignment: Normal.

Skull base and vertebrae: No acute fracture. No primary bone lesion
or focal pathologic process.

Soft tissues and spinal canal: No prevertebral fluid or swelling. No
visible canal hematoma.

Disc levels: Multilevel intervertebral disc space narrowing, disc
herniations, osteophyte formation, and facet arthropathy is present
resulting in mild-to-moderate spinal canal stenosis. There is fusion
of the C2-C3 facet on the left.

Upper chest: Negative.

Other: Atherosclerotic calcification of the carotid arteries is
noted.
IMPRESSION: 1. No acute intracranial hemorrhage.
2. Atrophy with chronic microvascular ischemic changes and old
lacunar infarcts bilaterally.
3. Stable degenerative changes in the cervical spine without
evidence of acute fracture.

## 2021-03-14 IMAGING — CR DG KNEE COMPLETE 4+V*L*
4 series · 4 of 4 positions shown · non-contrast
Comparison: None.

CLINICAL DATA: Unwitnessed fall.

EXAM:
LEFT KNEE - COMPLETE 4+ VIEW

[knee ap]
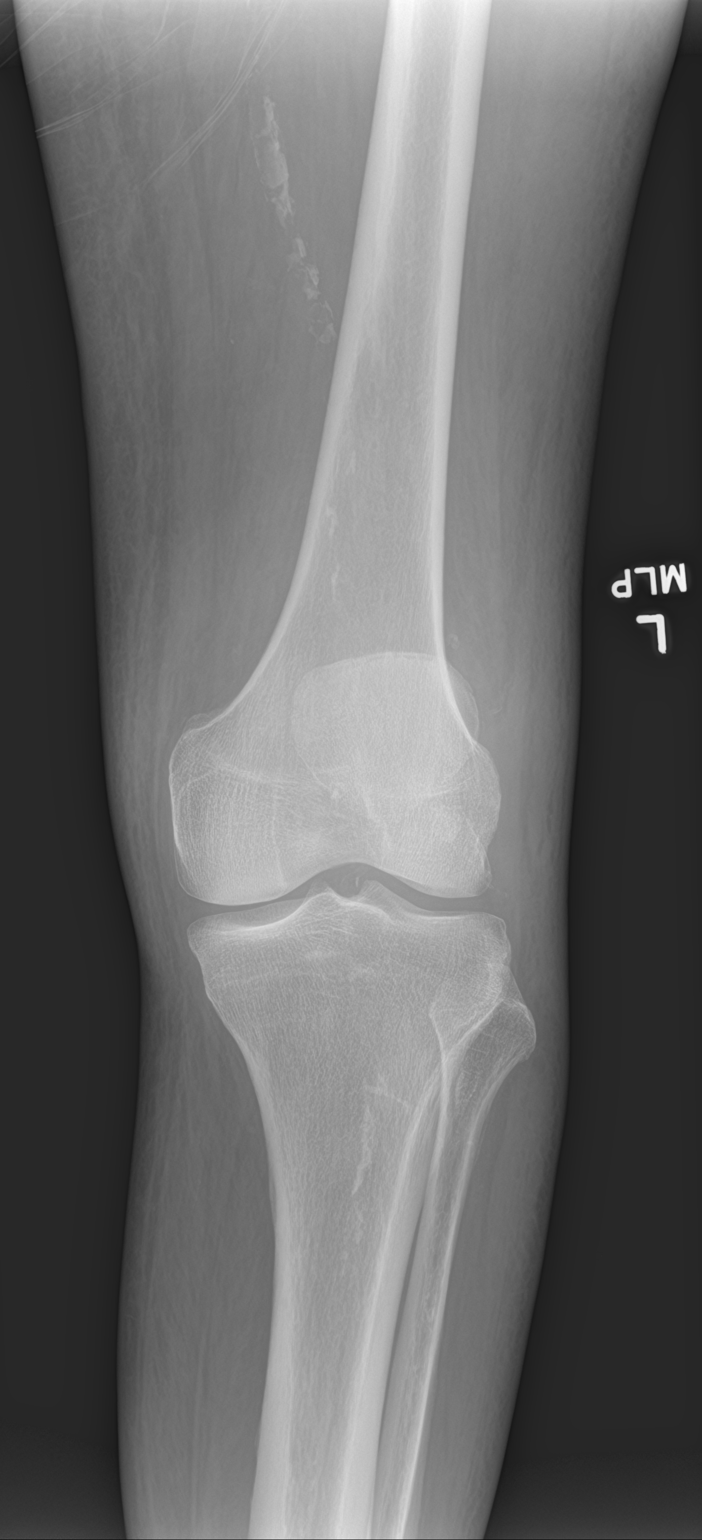

[knee lat]
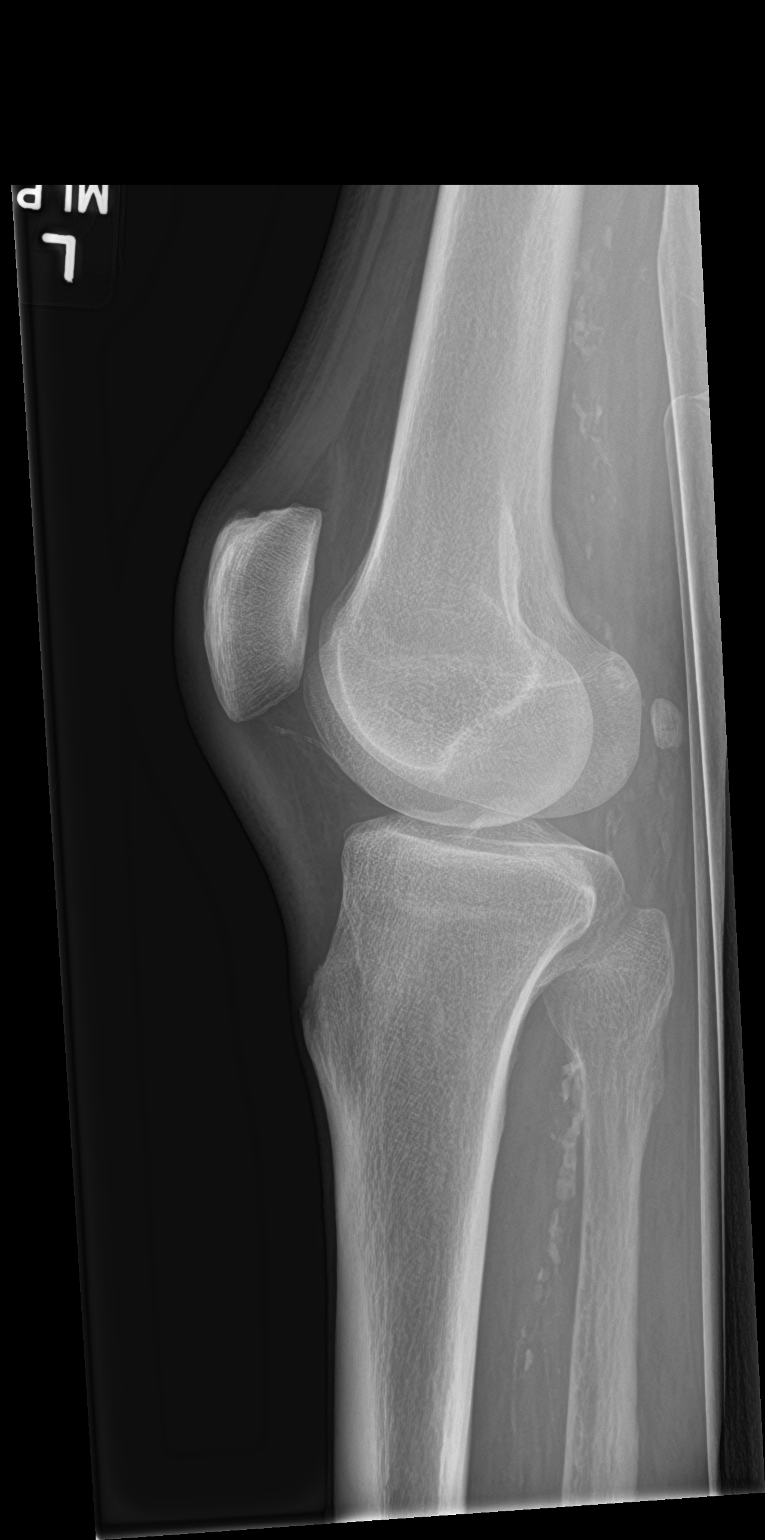

[knee obl (1 of 2)]
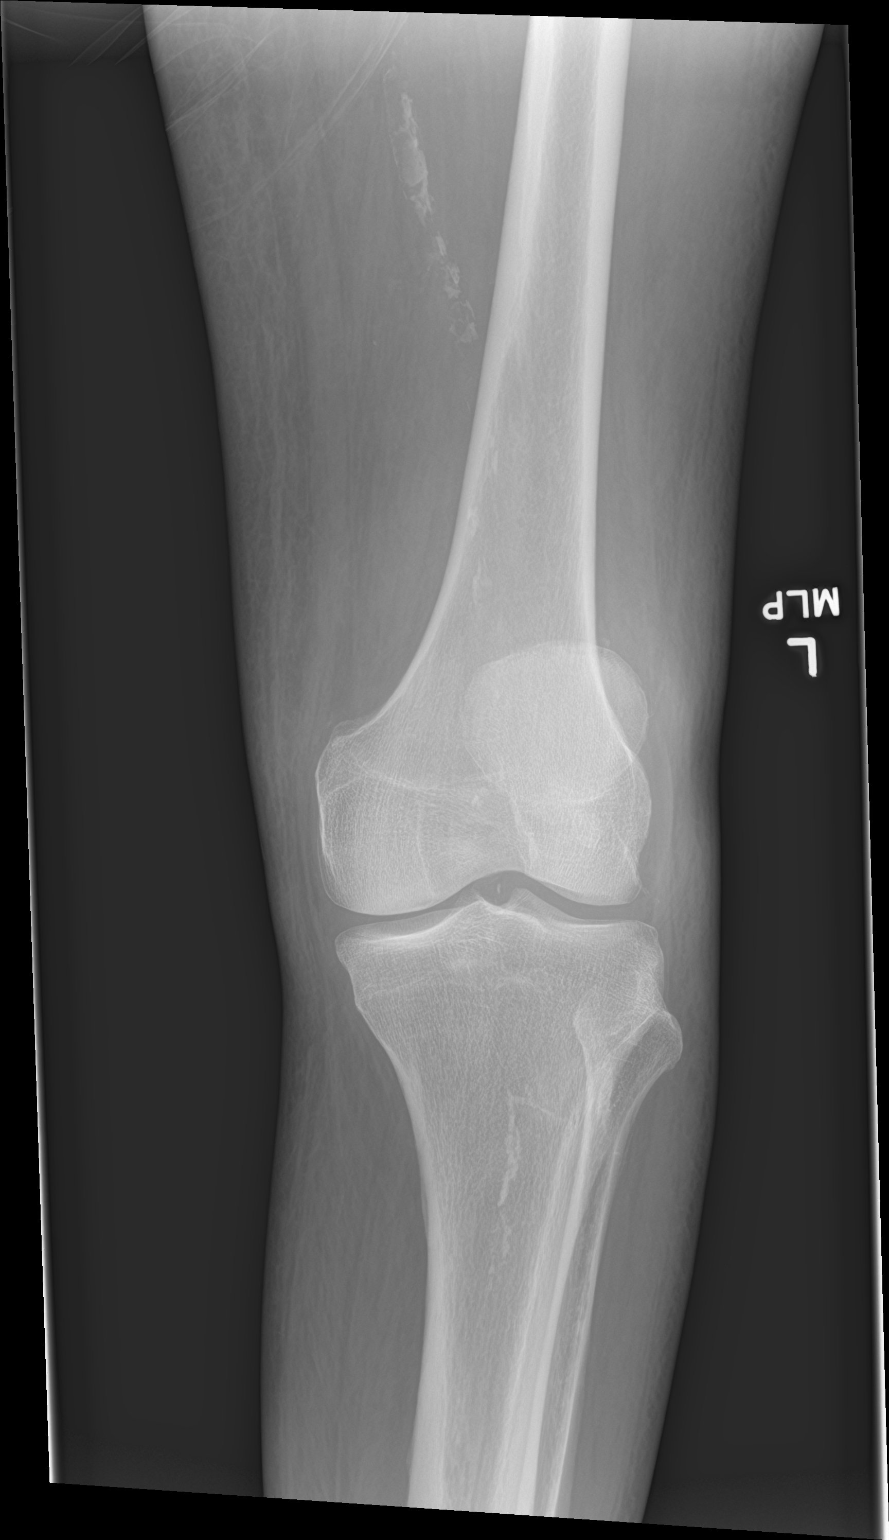

[knee obl (2 of 2)]
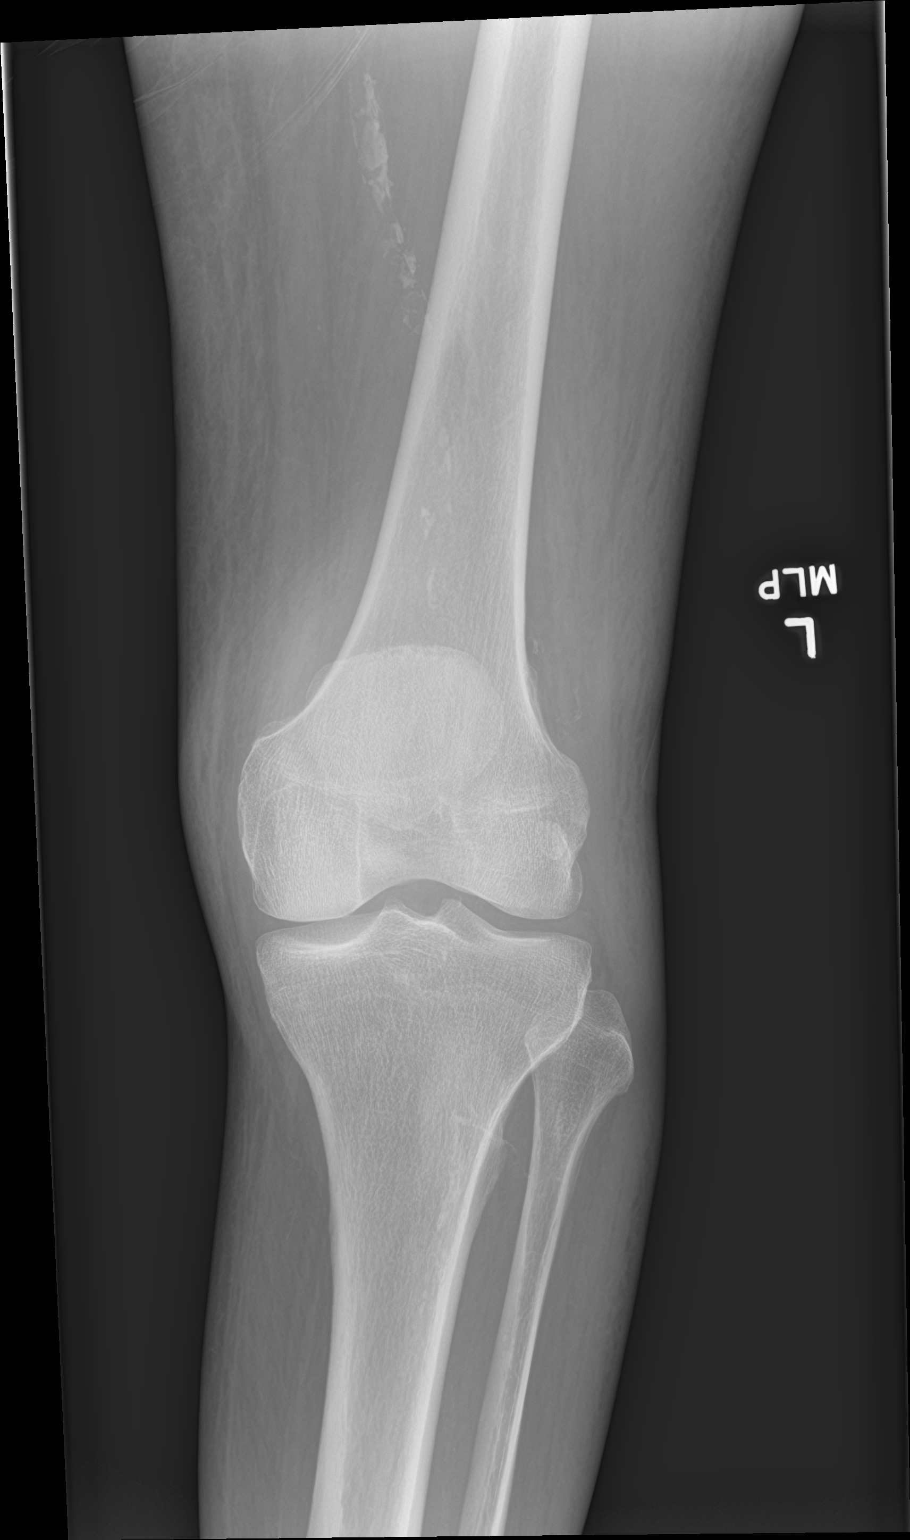

[4 of 4 positions shown; findings below may reference images not displayed]

FINDINGS: No acute fracture or dislocation. The bones are well mineralized. No
significant arthritic changes. No joint effusion. Vascular
calcification. The soft tissues are unremarkable.
IMPRESSION: Negative.

## 2021-03-14 NOTE — Patient Outreach (Signed)
[facility] Post- Acute Care Coordinator follow up. Member screened for potential [facility] Care Management needs.  Jason Hartman remains in [place] under private pay.  Update received from [facility], [place] indicating Jason Hartman will stay longer than his privately paid 30 days in SNF.  States spouse is not able to manage member at home at this time.   Writer inquired whether palliative is following in SNF.  Will continue to follow.   Marthenia Rolling, MSN, RN,BSN [place] Acute Care Coordinator [phone] [facility]) [phone]  (Toll free office)

## 2021-03-14 NOTE — ED Triage Notes (Signed)
Pt BIB [facility] from [facility], had a fall out of bed today. Denies LOC, per paperwork last time blood thinner given was in November. Alert to baseline, hx dementia.

## 2021-03-14 NOTE — ED Notes (Signed)
The facility  where the patient resides would like for the patient to get a CT of the head and an xray of the left leg.

## 2021-03-14 NOTE — Discharge Instructions (Signed)
Please bring Jason Hartman back to the emergency department if he develops any new or worsening symptoms.  Thank you.

## 2021-03-14 NOTE — ED Provider Notes (Signed)
[facility] EMERGENCY DEPARTMENT Provider Note   CSN: [id number] Arrival date & time: 03/14/21  2123     History Chief Complaint  Patient presents with   [name]    Jason Hartman is a 85 y.o. male.  HPI Patient is an 85 year old male with a history of CAD, PAF, MRSA, Parkinson's, memory loss, who presents to the emergency department via [facility] from [facility] due to an unwitnessed fall.  Patient tells me that his dinner tray was falling and he attempted to catch it and fell out of bed and hit the floor.  Believes that he hit the top of his head during the fall.  Patient notes pain to his left leg.  Patient otherwise denies any other regions of pain.  Currently A&O x2.  Patient knows his name as well as the year.  Patient discussed with nursing staff at [facility].  They state that he is currently at his cognitive baseline.  They did not witness the fall.  They are unsure of head trauma but states that his "head was above the bed wheel".  Medical records indicate the patient is on Eliquis though his paperwork from the facility indicates that he discontinued Eliquis on November 16.  They state that he has a PICC line in place and is currently receiving IV vancomycin and ertapenem for a MRSA infection.    Past Medical History:  Diagnosis Date   CAD (coronary artery disease)    s/p stents   Cellulitis and abscess of leg, except foot    Hypertension    Obstructive sleep apnea (adult) (pediatric)    Restless legs syndrome (RLS)    Thoracic or lumbosacral neuritis or radiculitis, unspecified    Type II or unspecified type diabetes mellitus without mention of complication, not stated as uncontrolled    Unspecified disease of pericardium    Unspecified hereditary and idiopathic peripheral neuropathy     Patient Active Problem List   Diagnosis Date Noted   MRSA infection    ESBL (extended spectrum beta-lactamase) producing bacteria infection 01/27/2021   Prosthetic  joint infection of left hip ([place]) 01/26/2021   Hip fracture ([place]) 12/29/2020   Malnutrition of moderate degree 10/27/2020   UTI (urinary tract infection) 10/25/2020   Sepsis ([place]) 10/12/2020   AMS (altered mental status)    PAF (paroxysmal atrial fibrillation) (HCC)    Acute metabolic encephalopathy 11/94/1740   Anxiety 09/09/2020   SIRS (systemic inflammatory response syndrome) ([place]) 09/09/2020   Diaphoresis    Slow transit constipation 08/07/2020   Abdominal aortic aneurysm without rupture 03/17/2020   Abnormal gait 03/17/2020   Ascending aorta dilatation ([place]) 03/17/2020   Diabetic renal disease ([place]) 03/17/2020   Recurrent UTI 07/16/2019   Hypertensive urgency 04/26/2019   Malignant HTN with heart disease, w/o CHF, w/o chronic kidney disease 04/26/2019   Elevated troponin    LFTs abnormal    Statin intolerance 01/15/2019   Penis pain 04/10/2018   Urethra cancer ([place]) 02/25/2018   Lower extremity edema 06/09/2017   Peptic ulcer disease 05/16/2017   GERD (gastroesophageal reflux disease) 05/16/2017   Chronic kidney disease, stage 3a ([place]) 04/11/2017   Lower urinary tract symptoms (LUTS) 04/15/2016   Narcotic dependence ([place]) 08/31/2015   Imbalance 08/31/2015   Diabetic peripheral neuropathy ([place]) 08/31/2015   Other malaise and fatigue 05/02/2015   Chronic fatigue 05/02/2015   Panic disorder without agoraphobia 03/22/2015   Hyperlipidemia 02/03/2014   Thrombocytopenia ([place]) 08/18/2013   Parkinsonism ([place]) 08/18/2013  Other disorders of lung 08/18/2013   Organic impotence 08/18/2013   Memory loss 08/18/2013   Low back pain 08/18/2013   Iron deficiency anemia 08/18/2013   Hypercalcemia 08/18/2013   Cellulitis of right leg 08/18/2013   Atherosclerotic heart disease of native coronary artery without angina pectoris 08/18/2013   RLS (restless legs syndrome) 09/01/2012   HERPES SIMPLEX INFECTION 11/06/2006   Type II diabetes mellitus ([place]) 11/06/2006   HYPOGONADISM  11/06/2006   VITAMIN D DEFICIENCY 11/06/2006   ANXIETY 11/06/2006   OBSTRUCTIVE SLEEP APNEA 11/06/2006   RESTLESS LEG SYNDROME 11/06/2006   Essential hypertension 11/06/2006   BENIGN PROSTATIC HYPERTROPHY 11/06/2006   ROSACEA 11/06/2006   OSTEOARTHRITIS 11/06/2006   INSOMNIA 11/06/2006   HYPERGLYCEMIA 11/06/2006   COLONIC POLYPS, HX OF 11/06/2006    Past Surgical History:  Procedure Laterality Date   ANAL FISSURE REPAIR     ANTERIOR APPROACH HEMI HIP ARTHROPLASTY Left 12/30/2020   Procedure: ANTERIOR APPROACH HIP ARTHROPLASTY;  Surgeon: Vanetta Mulders, MD;  Location: [place];  Service: Orthopedics;  Laterality: Left;   APPLICATION OF WOUND VAC Left 01/26/2021   Procedure: APPLICATION OF WOUND VAC;  Surgeon: Vanetta Mulders, MD;  Location: [place];  Service: Orthopedics;  Laterality: Left;   I & D EXTREMITY Left 01/26/2021   Procedure: IRRIGATION AND DEBRIDEMENT LEFT HIP;  Surgeon: Vanetta Mulders, MD;  Location: [place] [facility];  Service: Orthopedics;  Laterality: Left;  30 MIN   pericarditis  2009       Family History  Problem Relation Age of Onset   Diabetes Father     Social History   Tobacco Use   Smoking status: Former    Types: Cigarettes    Quit date: 09/01/1980    Years since quitting: 40.5   Smokeless tobacco: Never  Vaping Use   Vaping Use: Never used  Substance Use Topics   Alcohol use: Not Currently    Comment: rare   Drug use: Never    Home Medications Prior to Admission medications   Medication Sig Start Date End Date Taking? Authorizing Provider  acetaminophen (TYLENOL) 500 MG tablet Take 1,000 mg by mouth every 8 (eight) hours.    [provider]  Acidophilus Lactobacillus CAPS Take 1 capsule by mouth every evening.    [provider]  amiodarone (PACERONE) 200 MG tablet Take 1 tablet (200 mg total) by mouth daily. 09/26/20   Mercy Riding, MD  apixaban (ELIQUIS) 5 MG TABS tablet Take 1 tablet (5 mg total) by mouth 2 (two) times daily. 02/01/21    Barb Merino, MD  clonazePAM (KLONOPIN) 0.25 MG disintegrating tablet Take 1 tablet (0.25 mg total) by mouth every morning. 02/01/21   Barb Merino, MD  clonazePAM (KLONOPIN) 0.5 MG tablet Take 1 tablet (0.5 mg total) by mouth at bedtime. 02/01/21   Barb Merino, MD  ertapenem [facility]) IVPB Inject 1 g into the vein daily. Indication:  PJI First Dose: Yes Last Day of Therapy:  03/23/21 Labs - Once weekly:  CBC/D and BMP, Labs - Every other week:  ESR and CRP Method of administration: Mini-Bag Plus / Gravity Method of administration may be changed at the discretion of home infusion pharmacist based upon assessment of the patient and/or caregiver's ability to self-administer the medication ordered. 02/01/21 03/23/21  Barb Merino, MD  famotidine (PEPCID) 20 MG tablet Take 20 mg by mouth every morning.    [provider]  ferrous sulfate 325 (65 FE) MG EC tablet Take 325 mg by mouth every  morning. 05/28/18   [provider]  gabapentin (NEURONTIN) 300 MG capsule Take 1 capsule (300 mg total) by mouth at bedtime. 09/25/20   Mercy Riding, MD  LANTUS SOLOSTAR 100 UNIT/ML Solostar Pen Inject 8 Units into the skin 2 (two) times daily. 09/25/20   Mercy Riding, MD  melatonin 5 MG TABS Take 5 mg by mouth at bedtime.    [provider]  metFORMIN (GLUCOPHAGE-XR) 500 MG 24 hr tablet Take 500 mg by mouth daily with breakfast. 01/17/20   [provider]  methocarbamol (ROBAXIN) 500 MG tablet Take 1 tablet (500 mg total) by mouth every 6 (six) hours as needed for muscle spasms. 01/08/21   Amin, Jeanella Flattery, MD  metoprolol tartrate (LOPRESSOR) 25 MG tablet Take 0.5 tablets (12.5 mg total) by mouth 2 (two) times daily. 09/25/20   Mercy Riding, MD  nystatin (MYCOSTATIN/NYSTOP) powder Apply 1 application topically 2 (two) times daily. To rash on back, groin, and sacrum    [provider]  OLANZapine (ZYPREXA) 2.5 MG tablet Take 1 tablet (2.5 mg total) by mouth at  bedtime. 02/01/21 03/03/21  Barb Merino, MD  polyethylene glycol powder (GLYCOLAX/MIRALAX) 17 GM/SCOOP powder Take 17 g by mouth every morning. Mix in 8 oz water and drink    [provider]  Rotigotine (NEUPRO) 8 MG/24HR PT24 Place 1 patch onto the skin at bedtime. For Parkinson's    [provider]  Semaglutide,0.25 or 0.5MG/DOS, (OZEMPIC, 0.25 OR 0.5 MG/DOSE,) 2 MG/1.5ML SOPN Inject 0.5 mg into the skin every Tuesday.    [provider]  senna-docusate (SENOKOT-S) 8.6-50 MG tablet Take 1 tablet by mouth 2 (two) times daily as needed for moderate constipation. 09/25/20   Mercy Riding, MD  thiamine 100 MG tablet Take 1 tablet (100 mg total) by mouth daily. 09/26/20   Mercy Riding, MD  traMADol (ULTRAM) 50 MG tablet Take 1.5 tablets (75 mg total) by mouth 2 (two) times daily. 02/01/21   Barb Merino, MD  vancomycin IVPB Inject 1,250 mg into the vein daily. Indication:  PJI First Dose: Yes Last Day of Therapy:  03/23/21 Labs - Sunday/Monday:  CBC/D, BMP, and vancomycin trough. Labs - Thursday:  BMP and vancomycin trough Labs - Every other week:  ESR and CRP Method of administration:Elastomeric Method of administration may be changed at the discretion of the patient and/or caregiver's ability to self-administer the medication ordered. 02/01/21 03/23/21  Barb Merino, MD    Allergies    Oxycodone, Iodinated diagnostic agents, Iodine, Linezolid, Methadone hcl, Promethazine, Isovue [iopamidol], Methadone, Morphine sulfate, Omnipaque [iohexol], Quetiapine, Renografin [diatrizoate], Statins, Zolpidem, Atorvastatin, Carbidopa-levodopa, Chlorhexidine gluconate [chlorhexidine], Clindamycin, Colesevelam, Doxazosin, Lovastatin, and Rosuvastatin  Review of Systems   Review of Systems  All other systems reviewed and are negative. Ten systems reviewed and are negative for acute change, except as noted in the HPI.   Physical Exam Updated Vital Signs BP (!) 178/79 (BP  Location: Right Arm)   Pulse 81   Temp 97.9 F (36.6 C)   Resp 20   SpO2 92%   Physical Exam Vitals and nursing note reviewed.  Constitutional:      General: He is not in acute distress.    Appearance: Normal appearance. He is normal weight. He is not ill-appearing, toxic-appearing or diaphoretic.     Comments: Cachectic elderly adult male.  Follows commands and answers questions when asked.  HENT:     Head: Normocephalic and atraumatic.     Comments:  No visible or palpable signs of trauma across the face or scalp.  No bleeding or lacerations.    Right Ear: External ear normal.     Left Ear: External ear normal.     Nose: Nose normal.     Mouth/Throat:     Mouth: Mucous membranes are moist.     Pharynx: Oropharynx is clear. No oropharyngeal exudate or posterior oropharyngeal erythema.  Eyes:     General: No scleral icterus.       Right eye: No discharge.        Left eye: No discharge.     Extraocular Movements: Extraocular movements intact.     Conjunctiva/sclera: Conjunctivae normal.  Cardiovascular:     Rate and Rhythm: Normal rate and regular rhythm.     Pulses: Normal pulses.     Heart sounds: Normal heart sounds. No murmur heard.   No friction rub. No gallop.     Comments: PICC line in place in the left upper extremity.  Appears to be appropriately positioned with bandaging intact. Pulmonary:     Effort: Pulmonary effort is normal. No respiratory distress.     Breath sounds: Normal breath sounds. No stridor. No wheezing, rhonchi or rales.     Comments: Lungs are clear to auscultation bilaterally.  No anterior chest wall tenderness or signs of trauma. Abdominal:     General: Abdomen is flat.     Palpations: Abdomen is soft.     Tenderness: There is no abdominal tenderness.     Comments: Abdomen is soft and nontender.  No signs of trauma.  Musculoskeletal:        General: Tenderness present. Normal range of motion.     Cervical back: Normal range of motion and neck  supple. No tenderness.     Comments: No midline C, T, or L-spine tenderness.  No step-offs, crepitus, or deformities.  No signs of trauma.  No tenderness appreciated with manipulation of the pelvic girdle.  Small bruise and mild tenderness noted along the left medial knee.  Skin:    General: Skin is warm and dry.     Findings: Bruising present.  Neurological:     General: No focal deficit present.     Mental Status: He is alert.     Comments: A&O x2.  Frail and has difficulty moving his extremities.  Psychiatric:        Mood and Affect: Mood normal.        Behavior: Behavior normal.   ED Results / Procedures / Treatments   Labs (all labs ordered are listed, but only abnormal results are displayed) Labs Reviewed - No data to display  EKG None  Radiology DG Chest 1 View  Result Date: 03/14/2021 CLINICAL DATA:  Fall. EXAM: CHEST  1 VIEW COMPARISON:  Chest x-ray 03/01/2021.  CT chest 12/21/2019. FINDINGS: Left-sided central venous catheter tip projects over the proximal SVC. The cardiomediastinal silhouette is within normal limits for projection. There is stable mild elevation of the right hemidiaphragm. There is no definite focal lung consolidation, pleural effusion or pneumothorax. No acute fractures are seen. IMPRESSION: 1. No acute cardiopulmonary process. Electronically Signed   By: Ronney Asters M.D.   On: 03/14/2021 22:31   DG Pelvis 1-2 Views  Result Date: 03/14/2021 CLINICAL DATA:  Unwitnessed fall, dementia. EXAM: PELVIS - 1-2 VIEW COMPARISON:  Pelvis x-ray 12/30/2020. FINDINGS: Left hip arthroplasty appears in anatomic alignment. There is no evidence for hardware loosening or acute fracture. There is no dislocation. Joint  spaces are maintained. IMPRESSION: 1. No acute fracture or dislocation. 2. Left hip arthroplasty in anatomic alignment. Electronically Signed   By: Ronney Asters M.D.   On: 03/14/2021 22:34   DG Ankle Complete Left  Result Date: 03/14/2021 CLINICAL DATA:   Unwitnessed fall, dementia EXAM: LEFT ANKLE COMPLETE - 3+ VIEW COMPARISON:  None. FINDINGS: There is no evidence of fracture, dislocation, or joint effusion. There is no evidence of arthropathy or other focal bone abnormality. Soft tissues are unremarkable. IMPRESSION: Negative. Electronically Signed   By: Fidela Salisbury M.D.   On: 03/14/2021 22:31   CT HEAD WO CONTRAST (5MM)  Result Date: 03/14/2021 CLINICAL DATA:  Neck trauma, fall out of bed. EXAM: CT HEAD WITHOUT CONTRAST CT CERVICAL SPINE WITHOUT CONTRAST TECHNIQUE: Multidetector CT imaging of the head and cervical spine was performed following the standard protocol without intravenous contrast. Multiplanar CT image reconstructions of the cervical spine were also generated. COMPARISON:  10/11/2020, 01/11/2021. FINDINGS: CT HEAD FINDINGS Brain: No acute intracranial hemorrhage, midline shift or mass effect. No extra-axial fluid collection is seen. There is diffuse generalized atrophy. Subcortical and periventricular white matter hypodensities are noted bilaterally. There is no hydrocephalus. There are old lacunar infarcts in the basal ganglia bilaterally and in the corona radiata on the right. Vascular: Atherosclerotic calcification of the carotid siphons and vertebral arteries. No hyperdense vessel Skull: Normal. Negative for fracture or focal lesion. Sinuses/Orbits: There is partial opacification of the sphenoid sinus on the right. The orbits are within normal limits. Other: None. CT CERVICAL SPINE FINDINGS Alignment: Normal. Skull base and vertebrae: No acute fracture. No primary bone lesion or focal pathologic process. Soft tissues and spinal canal: No prevertebral fluid or swelling. No visible canal hematoma. Disc levels: Multilevel intervertebral disc space narrowing, disc herniations, osteophyte formation, and facet arthropathy is present resulting in mild-to-moderate spinal canal stenosis. There is fusion of the C2-C3 facet on the left. Upper chest:  Negative. Other: Atherosclerotic calcification of the carotid arteries is noted. IMPRESSION: 1. No acute intracranial hemorrhage. 2. Atrophy with chronic microvascular ischemic changes and old lacunar infarcts bilaterally. 3. Stable degenerative changes in the cervical spine without evidence of acute fracture. Electronically Signed   By: Brett Fairy M.D.   On: 03/14/2021 23:04   CT Cervical Spine Wo Contrast  Result Date: 03/14/2021 CLINICAL DATA:  Neck trauma, fall out of bed. EXAM: CT HEAD WITHOUT CONTRAST CT CERVICAL SPINE WITHOUT CONTRAST TECHNIQUE: Multidetector CT imaging of the head and cervical spine was performed following the standard protocol without intravenous contrast. Multiplanar CT image reconstructions of the cervical spine were also generated. COMPARISON:  10/11/2020, 01/11/2021. FINDINGS: CT HEAD FINDINGS Brain: No acute intracranial hemorrhage, midline shift or mass effect. No extra-axial fluid collection is seen. There is diffuse generalized atrophy. Subcortical and periventricular white matter hypodensities are noted bilaterally. There is no hydrocephalus. There are old lacunar infarcts in the basal ganglia bilaterally and in the corona radiata on the right. Vascular: Atherosclerotic calcification of the carotid siphons and vertebral arteries. No hyperdense vessel Skull: Normal. Negative for fracture or focal lesion. Sinuses/Orbits: There is partial opacification of the sphenoid sinus on the right. The orbits are within normal limits. Other: None. CT CERVICAL SPINE FINDINGS Alignment: Normal. Skull base and vertebrae: No acute fracture. No primary bone lesion or focal pathologic process. Soft tissues and spinal canal: No prevertebral fluid or swelling. No visible canal hematoma. Disc levels: Multilevel intervertebral disc space narrowing, disc herniations, osteophyte formation, and facet arthropathy is present resulting in  mild-to-moderate spinal canal stenosis. There is fusion of the  C2-C3 facet on the left. Upper chest: Negative. Other: Atherosclerotic calcification of the carotid arteries is noted. IMPRESSION: 1. No acute intracranial hemorrhage. 2. Atrophy with chronic microvascular ischemic changes and old lacunar infarcts bilaterally. 3. Stable degenerative changes in the cervical spine without evidence of acute fracture. Electronically Signed   By: Brett Fairy M.D.   On: 03/14/2021 23:04   DG Knee Complete 4 Views Left  Result Date: 03/14/2021 CLINICAL DATA:  Unwitnessed fall. EXAM: LEFT KNEE - COMPLETE 4+ VIEW COMPARISON:  None. FINDINGS: No acute fracture or dislocation. The bones are well mineralized. No significant arthritic changes. No joint effusion. Vascular calcification. The soft tissues are unremarkable. IMPRESSION: Negative. Electronically Signed   By: Anner Crete M.D.   On: 03/14/2021 22:33    Procedures Procedures   Medications Ordered in ED Medications - No data to display  ED Course  I have reviewed the triage vital signs and the nursing notes.  Pertinent labs & imaging results that were available during my care of the patient were reviewed by me and considered in my medical decision making (see chart for details).     MDM Rules/Calculators/A&P                          Pt is a 85 y.o. male who presents to the emergency department from [facility] due to an unwitnessed fall out of bed.  Patient complains of pain to his left leg as well as to the top of his head.  Imaging: CXR is negative. Pelvic x-ray shows no acute fracture or dislocation. Left hip arthroplasty in appropriate anatomic alignment. X-ray of the left knee is negative. X-ray of the left ankle is negative. CT scan of the head and neck without contrast shows IMPRESSION: 1. No acute intracranial hemorrhage. 2. Atrophy with chronic microvascular ischemic changes and old lacunar infarcts bilaterally. 3. Stable degenerative changes in the cervical spine without evidence of acute  fracture.  I, Rayna Sexton, PA-C, personally reviewed and evaluated these images and lab results as part of my medical decision-making.  On my exam patient notes that he struck the top of his head and notes "left leg pain" but otherwise has no complaints. No obvious signs of trauma. CT scans and x-rays obtained with findings as noted above. Imaging all appears reassuring. Per records from his nursing facility it appears that his eliquis was discontinued in November and he is no longer anticoagulated.  Feel that the patient is stable for discharge at this time. Will transfer patient back to his facility via [place]. Patient sleeping comfortably in bed.  Note: Portions of this report may have been transcribed using voice recognition software. Every effort was made to ensure accuracy; however, inadvertent computerized transcription errors may be present.   Final Clinical Impression(s) / ED Diagnoses Final diagnoses:  Fall, initial encounter  Injury of head, initial encounter  Left leg pain   Rx / DC Orders ED Discharge Orders     None        Rayna Sexton, PA-C 03/15/21 1122    Regan Lemming, MD 03/15/21 1501

## 2021-03-15 ENCOUNTER — Encounter (HOSPITAL_BASED_OUTPATIENT_CLINIC_OR_DEPARTMENT_OTHER): Payer: Medicare Other | Admitting: Orthopaedic Surgery

## 2021-03-15 DIAGNOSIS — L97522 Non-pressure chronic ulcer of other part of left foot with fat layer exposed: Secondary | ICD-10-CM | POA: Diagnosis not present

## 2021-03-15 DIAGNOSIS — M6281 Muscle weakness (generalized): Secondary | ICD-10-CM | POA: Diagnosis not present

## 2021-03-15 DIAGNOSIS — Z7401 Bed confinement status: Secondary | ICD-10-CM | POA: Diagnosis not present

## 2021-03-15 DIAGNOSIS — T8452XD Infection and inflammatory reaction due to internal left hip prosthesis, subsequent encounter: Secondary | ICD-10-CM | POA: Diagnosis not present

## 2021-03-15 DIAGNOSIS — F411 Generalized anxiety disorder: Secondary | ICD-10-CM | POA: Diagnosis not present

## 2021-03-15 DIAGNOSIS — L97322 Non-pressure chronic ulcer of left ankle with fat layer exposed: Secondary | ICD-10-CM | POA: Diagnosis not present

## 2021-03-15 DIAGNOSIS — R4182 Altered mental status, unspecified: Secondary | ICD-10-CM | POA: Diagnosis not present

## 2021-03-15 DIAGNOSIS — I10 Essential (primary) hypertension: Secondary | ICD-10-CM | POA: Diagnosis not present

## 2021-03-15 DIAGNOSIS — S72002D Fracture of unspecified part of neck of left femur, subsequent encounter for closed fracture with routine healing: Secondary | ICD-10-CM | POA: Diagnosis not present

## 2021-03-15 DIAGNOSIS — T8189XA Other complications of procedures, not elsewhere classified, initial encounter: Secondary | ICD-10-CM | POA: Diagnosis not present

## 2021-03-15 DIAGNOSIS — R2681 Unsteadiness on feet: Secondary | ICD-10-CM | POA: Diagnosis not present

## 2021-03-15 DIAGNOSIS — R0902 Hypoxemia: Secondary | ICD-10-CM | POA: Diagnosis not present

## 2021-03-16 ENCOUNTER — Ambulatory Visit: Payer: Medicare Other | Admitting: Internal Medicine

## 2021-03-16 DIAGNOSIS — M6281 Muscle weakness (generalized): Secondary | ICD-10-CM | POA: Diagnosis not present

## 2021-03-16 DIAGNOSIS — F411 Generalized anxiety disorder: Secondary | ICD-10-CM | POA: Diagnosis not present

## 2021-03-16 DIAGNOSIS — R2681 Unsteadiness on feet: Secondary | ICD-10-CM | POA: Diagnosis not present

## 2021-03-16 DIAGNOSIS — S72002D Fracture of unspecified part of neck of left femur, subsequent encounter for closed fracture with routine healing: Secondary | ICD-10-CM | POA: Diagnosis not present

## 2021-03-16 DIAGNOSIS — T8452XD Infection and inflammatory reaction due to internal left hip prosthesis, subsequent encounter: Secondary | ICD-10-CM | POA: Diagnosis not present

## 2021-03-16 DIAGNOSIS — R4182 Altered mental status, unspecified: Secondary | ICD-10-CM | POA: Diagnosis not present

## 2021-03-16 NOTE — Progress Notes (Deleted)
[place] for Infectious Disease  CHIEF COMPLAINT:    Follow up for prosthetic hip infection  SUBJECTIVE:    Jason Hartman is a 85 y.o. 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   Here for routine follow up.  Was last seen on 02/19/21 by myself.  Since that time he has been evaluated in the ED x 2 for a fall.  On one of those occasions his PICC line was dislodged and had to be replaced.    He was hospitalized at [facility] from 10/21-10/27/22 after presenting with PJI/OM of the left hip.  This was in the setting of a wound that developed after sustaining a fall at his SNF.  This wound tracked down below the fascia and to the bone as well as a recent hip hemiarthroplasty.  He is s/p I&D on 1021/22 with orthopedics and was deemed not to be a candidate for any type of resection arthroplasty.   His OR cultures unfortunately grew MRSA and ESBL Kleb pna.  There are no conventional oral options for the latter and he is currently on vancomycin and ertapenem through 03/23/21 (8 weeks of treatment).  He is here today for follow up and continues to live at [facility].  He has no new specific complaints.  He has been seen by his surgeon last on 02/27/21.  Please see A&P for the details of today's visit and status of the patient's medical problems.   Patient's Medications  New Prescriptions   No medications on file  Previous Medications   ACETAMINOPHEN (TYLENOL) 500 MG TABLET    Take 1,000 mg by mouth every 8 (eight) hours.   ACIDOPHILUS LACTOBACILLUS CAPS    Take 1 capsule by mouth every evening.   AMIODARONE (PACERONE) 200 MG TABLET    Take 1 tablet (200 mg total) by mouth daily.   APIXABAN (ELIQUIS) 5 MG TABS TABLET    Take 1 tablet (5 mg total) by mouth 2 (two) times daily.   CLONAZEPAM (KLONOPIN) 0.25 MG DISINTEGRATING TABLET    Take 1 tablet (0.25 mg total) by mouth every morning.   CLONAZEPAM (KLONOPIN) 0.5 MG TABLET    Take 1  tablet (0.5 mg total) by mouth at bedtime.   ERTAPENEM (INVANZ) IVPB    Inject 1 g into the vein daily. Indication:  PJI First Dose: Yes Last Day of Therapy:  03/23/21 Labs - Once weekly:  CBC/D and BMP, Labs - Every other week:  ESR and CRP Method of administration: Mini-Bag Plus / Gravity Method of administration may be changed at the discretion of home infusion pharmacist based upon assessment of the patient and/or caregiver's ability to self-administer the medication ordered.   FAMOTIDINE (PEPCID) 20 MG TABLET    Take 20 mg by mouth every morning.   FERROUS SULFATE 325 (65 FE) MG EC TABLET    Take 325 mg by mouth every morning.   GABAPENTIN (NEURONTIN) 300 MG CAPSULE    Take 1 capsule (300 mg total) by mouth at bedtime.   LANTUS SOLOSTAR 100 UNIT/ML SOLOSTAR PEN    Inject 8 Units into the skin 2 (two) times daily.   MELATONIN 5 MG TABS    Take 5 mg by mouth at bedtime.   METFORMIN (GLUCOPHAGE-XR) 500 MG 24 HR TABLET    Take 500 mg by mouth daily with breakfast.   METHOCARBAMOL (ROBAXIN) 500 MG TABLET  Take 1 tablet (500 mg total) by mouth every 6 (six) hours as needed for muscle spasms.   METOPROLOL TARTRATE (LOPRESSOR) 25 MG TABLET    Take 0.5 tablets (12.5 mg total) by mouth 2 (two) times daily.   NYSTATIN (MYCOSTATIN/NYSTOP) POWDER    Apply 1 application topically 2 (two) times daily. To rash on back, groin, and sacrum   OLANZAPINE (ZYPREXA) 2.5 MG TABLET    Take 1 tablet (2.5 mg total) by mouth at bedtime.   POLYETHYLENE GLYCOL POWDER (GLYCOLAX/MIRALAX) 17 GM/SCOOP POWDER    Take 17 g by mouth every morning. Mix in 8 oz water and drink   ROTIGOTINE (NEUPRO) 8 MG/24HR PT24    Place 1 patch onto the skin at bedtime. For Parkinson's   SEMAGLUTIDE,0.25 OR 0.5MG/DOS, (OZEMPIC, 0.25 OR 0.5 MG/DOSE,) 2 MG/1.5ML SOPN    Inject 0.5 mg into the skin every Tuesday.   SENNA-DOCUSATE (SENOKOT-S) 8.6-50 MG TABLET    Take 1 tablet by mouth 2 (two) times daily as needed for moderate constipation.    THIAMINE 100 MG TABLET    Take 1 tablet (100 mg total) by mouth daily.   TRAMADOL (ULTRAM) 50 MG TABLET    Take 1.5 tablets (75 mg total) by mouth 2 (two) times daily.   VANCOMYCIN IVPB    Inject 1,250 mg into the vein daily. Indication:  PJI First Dose: Yes Last Day of Therapy:  03/23/21 Labs - Sunday/Monday:  CBC/D, BMP, and vancomycin trough. Labs - Thursday:  BMP and vancomycin trough Labs - Every other week:  ESR and CRP Method of administration:Elastomeric Method of administration may be changed at the discretion of the patient and/or caregiver's ability to self-administer the medication ordered.  Modified Medications   No medications on file  Discontinued Medications   No medications on file      Past Medical History:  Diagnosis Date   CAD (coronary artery disease)    s/p stents   Cellulitis and abscess of leg, except foot    Hypertension    Obstructive sleep apnea (adult) (pediatric)    Restless legs syndrome (RLS)    Thoracic or lumbosacral neuritis or radiculitis, unspecified    Type II or unspecified type diabetes mellitus without mention of complication, not stated as uncontrolled    Unspecified disease of pericardium    Unspecified hereditary and idiopathic peripheral neuropathy     Social History   Tobacco Use   Smoking status: Former    Types: Cigarettes    Quit date: 09/01/1980    Years since quitting: 40.5   Smokeless tobacco: Never  Vaping Use   Vaping Use: Never used  Substance Use Topics   Alcohol use: Not Currently    Comment: rare   Drug use: Never    Family History  Problem Relation Age of Onset   Diabetes Father     Allergies  Allergen Reactions   Oxycodone Anaphylaxis   Iodinated Diagnostic Agents Hives    alleric to renografin,isovue & omnipaque, hives, requires 13 hr prep//a.[email or web address], Onset Date: [id number]      Iodine Hives and Rash    alleric to renografin,isovue & omnipaque, hives, requires 13 hr prep//a.[email or web address], Onset Date:  [id number] allergic to renografin,isovue & omnipaque, hives, requires 13 hr prep//a.[email or web address], Onset Date: [id number]   Linezolid Hives and Rash        Methadone Hcl Other (See Comments)    hallucinations    Promethazine Other (See Comments)    Delirium (pulled out IV), erratic behavior Mental status  change     Isovue [Iopamidol] Hives   Methadone    Morphine Sulfate Other (See Comments)    Unknown reaction   Omnipaque [Iohexol] Hives    Code: HIVES, Desc: alleric to renografin,isovue & omnipaque, hives, requires 13 hr prep//a.[email or web address], Onset Date: [id number]    Quetiapine Other (See Comments)    Unknown reaction   Renografin [Diatrizoate] Hives   Statins Other (See Comments)    Muscle weakness   Zolpidem Other (See Comments)    Erratic behavior/delusions Altered mental status   Atorvastatin Itching and Other (See Comments)    Tired, weakness    Carbidopa-Levodopa Anxiety   Chlorhexidine Gluconate [Chlorhexidine] Hives and Rash   Clindamycin Rash   Colesevelam Other (See Comments)    tired    Doxazosin Rash   Lovastatin Other (See Comments)    Tired, nervousness    Rosuvastatin Other (See Comments)    Tired, weakness     ROS   OBJECTIVE:    There were no vitals filed for this visit. There is no height or weight on file to calculate BMI.  Physical Exam   Labs and Microbiology: CBC Latest Ref Rng & Units 03/01/2021 01/29/2021 01/27/2021  WBC 4.0 - 10.5 K/uL 8.0 8.2 11.3(H)  Hemoglobin 13.0 - 17.0 g/dL 9.3(L) 8.5(L) 8.5(L)  Hematocrit 39.0 - 52.0 % 30.1(L) 27.9(L) 27.8(L)  Platelets 150 - 400 K/uL 244 260 305   CMP Latest Ref Rng & Units 03/01/2021 01/29/2021 01/27/2021  Glucose 70 - 99 mg/dL 62(L) 163(H) 215(H)  BUN 8 - 23 mg/dL 15 15 22   Creatinine 0.61 - 1.24 mg/dL 0.75 1.03 0.99  Sodium 135 - 145 mmol/L 141 137 132(L)  Potassium 3.5 - 5.1 mmol/L 3.5 3.8 5.7(H)  Chloride 98 - 111 mmol/L 107 102 100  CO2 22 - 32 mmol/L 28 27 24   Calcium 8.9 - 10.3 mg/dL  8.2(L) 8.2(L) 8.5(L)  Total Protein 6.5 - 8.1 g/dL 5.4(L) - -  Total Bilirubin 0.3 - 1.2 mg/dL 0.4 - -  Alkaline Phos 38 - 126 U/L 61 - -  AST 15 - 41 U/L 13(L) - -  ALT 0 - 44 U/L 9 - -      ASSESSMENT & PLAN:    No problem-specific Assessment & Plan notes found for this encounter.   No orders of the defined types were placed in this encounter.    This is a challenging situation given the MDR organisms isolated from his surgery in October and that he is unable to undergo a more definitive resection arthoplasty and antibiotic spacer placement.  We looked into omadacycline 356m daily PO as a suppressive option given it's MRSA and ESBL coverage, however, it was going to cost patient $750 per month.  Fortunately, with the assistance of pharmacy and [facility]present[facility] we have been able to procure 6 weeks worth of this antibiotic for zero cost.  Plan will be to finish ertapenem and vancomycin via PICC line as planned on 03/23/21.  This will complete 8 weeks of effective IV therapy.  Will then transition to omadacycline 3037mdaily for 6 more weeks.  At that point, will probably transition to doxycycline 10054mID for MRSA suppression as I would anticipate this to cause more of an issue with chronic infection and hopefully the ESBL will have been eradicated after the 14 weeks of therapy he will receive.  He was given 2 weeks of omadacycline today.  The next 2 week supply will be mailed to his house.  The  last 2 week supply will be delivered to our clinic.  RTC 4 weeks.  I also agree with overall plan towards a more palliative approach to maximize his comfort.  We have no other oral antibiotic options for his ESBL isolate.   [facility]ious [facility] 03/16/2021, 6:35 AM

## 2021-03-18 DIAGNOSIS — R2681 Unsteadiness on feet: Secondary | ICD-10-CM | POA: Diagnosis not present

## 2021-03-18 DIAGNOSIS — T8452XD Infection and inflammatory reaction due to internal left hip prosthesis, subsequent encounter: Secondary | ICD-10-CM | POA: Diagnosis not present

## 2021-03-18 DIAGNOSIS — S72002D Fracture of unspecified part of neck of left femur, subsequent encounter for closed fracture with routine healing: Secondary | ICD-10-CM | POA: Diagnosis not present

## 2021-03-18 DIAGNOSIS — M6281 Muscle weakness (generalized): Secondary | ICD-10-CM | POA: Diagnosis not present

## 2021-03-18 DIAGNOSIS — R4182 Altered mental status, unspecified: Secondary | ICD-10-CM | POA: Diagnosis not present

## 2021-03-18 DIAGNOSIS — F411 Generalized anxiety disorder: Secondary | ICD-10-CM | POA: Diagnosis not present

## 2021-03-19 ENCOUNTER — Non-Acute Institutional Stay: Payer: Medicare Other | Admitting: Student

## 2021-03-19 ENCOUNTER — Other Ambulatory Visit: Payer: Self-pay

## 2021-03-19 DIAGNOSIS — J690 Pneumonitis due to inhalation of food and vomit: Secondary | ICD-10-CM

## 2021-03-19 DIAGNOSIS — T8452XD Infection and inflammatory reaction due to internal left hip prosthesis, subsequent encounter: Secondary | ICD-10-CM

## 2021-03-19 DIAGNOSIS — R4182 Altered mental status, unspecified: Secondary | ICD-10-CM | POA: Diagnosis not present

## 2021-03-19 DIAGNOSIS — Z515 Encounter for palliative care: Secondary | ICD-10-CM | POA: Diagnosis not present

## 2021-03-19 DIAGNOSIS — R531 Weakness: Secondary | ICD-10-CM | POA: Diagnosis not present

## 2021-03-19 DIAGNOSIS — R52 Pain, unspecified: Secondary | ICD-10-CM | POA: Diagnosis not present

## 2021-03-19 DIAGNOSIS — F411 Generalized anxiety disorder: Secondary | ICD-10-CM | POA: Diagnosis not present

## 2021-03-19 DIAGNOSIS — S72002D Fracture of unspecified part of neck of left femur, subsequent encounter for closed fracture with routine healing: Secondary | ICD-10-CM | POA: Diagnosis not present

## 2021-03-19 DIAGNOSIS — M6281 Muscle weakness (generalized): Secondary | ICD-10-CM | POA: Diagnosis not present

## 2021-03-19 DIAGNOSIS — R2681 Unsteadiness on feet: Secondary | ICD-10-CM | POA: Diagnosis not present

## 2021-03-19 NOTE — Progress Notes (Addendum)
[facility] Consult Note Telephone: [phone]  Fax: [phone]    Date of encounter: 03/19/21  PATIENT NAME: Jason Hartman [address] [place] [address]   [phone] (home)  DOB: 10/18/1935 MRN: [id number] PRIMARY CARE PROVIDER:    [name], MD,  [place]. [employer] [address] [place] [address] [phone]  REFERRING PROVIDER:   Vilinda Boehringer, NP  RESPONSIBLE PARTY:    Contact Information     Name Relation Home Work Mobile   Biffle,Kathryn Spouse [phone]  [phone]   Antrell, Tipler   [phone]   Smith,Cynthia Daughter   [phone]        I met face to face with patient  in the facility. Palliative Care was asked to follow this patient by consultation request of  Vilinda Boehringer, NP to address advance care planning and complex medical decision making. This is a follow up visit.                                   ASSESSMENT AND PLAN / RECOMMENDATIONS:   Advance Care Planning/Goals of Care: Goals include to maximize quality of life and symptom management. Our advance care planning conversation included a discussion about:    The value and importance of advance care planning  Experiences with loved ones who have been seriously ill or have died  Exploration of personal, cultural or spiritual beliefs that might influence medical decisions  Exploration of goals of care in the event of a sudden injury or illness  Decision not to resuscitate or to de-escalate disease focused treatments due to poor prognosis. CODE STATUS: Full Code  Education provided on Palliative Medicine vs. Hospice services. Discussed with wife aggressive treatments vs comfort path. Wife states she does not want patient to continue going out to appointments as this is much more difficult for him, difficulty sitting in w/c. We discussed code status. She is in agreement with a DNR, but need to speak with patient's two  children. She is also in agreement with comfort path and acknowledges patient continued decline, infections. She states patient will be switching over to PO antibiotics when Vancomycin stops on 12/16. Wife will also discuss hospice with family. If family is in agreement, will proceed with hospice referral once he switches over to PO antibiotics.   Symptom Management/Plan:  Chronic left hip infection-patient continues on ertapenem and vancomycin; continue through 03/23/21. Patient to switch over to PO antibiotics for chronic suppression when IV antibiotics are complete. Follow up with ID as scheduled. Recommend air mattress; discussed with nursing supervisor.   Generalized weakness-secondary to chronic left hip infection, s/p left hip hemiarthroplasty. Staff to continue to provide assistance with all adl's.   Pain-secondary to left hip infection. Recommend increasing tramadol to 50 mg TID; discussed with nursing supervisor and facility NP.  Right Lower Lobe Pneumonia-likely due to aspiration. Continue Levaquin as directed, recommend Mucinex, aspiration precautions.   Follow up Palliative Care Visit: Palliative care will continue to follow for complex medical decision making, advance care planning, and clarification of goals. Return in 4 weeks or prn.  I spent 35 minutes providing this consultation. More than 50% of the time in this consultation was spent in counseling and care coordination.    PPS: 30%  HOSPICE ELIGIBILITY/DIAGNOSIS: TBD  Chief Complaint: Palliative Medicine follow up visit.   HISTORY OF PRESENT ILLNESS:  Jason Hartman is  a 85 y.o. year old male  with left hip hemiarthroplasty on 9/26, osteomyelitis, prosthetic joint infection of left hip, surgical wounds with ESBL, MRSA; Parkinson's disease, T2DM, anxiety, hypertension, paroxysmal atrial fibrillation, CKD 3a. ED visits on 03/14/21 and 03/01/21.   Patient currently at [facility]. Patient continues to receive  IV Vancomycin and ertapenem for left hip/wound infection. He is also currently receiving Levaquin for pneumonia. He is also receiving Dificid for c-diff through 03/23/21. He has a wound vac to left hip; changed every 4 days per facility staff. Patient is having increased difficulty clearing secretions, likely aspirating. He has increased weakness; no longer getting out of bed. He is dependent for all adl's. He is needing to be fed. His appetite is poor. No recent weights on file, although he is visibly thinner. His voice is much weaker. He has had increased confusion. He has had a couple of falls recently. He has intervals will he will yell out; receiving clonazepam routinely. Patient does yell out in pain when being turned or repositioned.  A 10-point review of systems is negative, except for the pertinent positives and negatives detailed in the HPI.    History obtained from review of EMR, discussion with primary team, and interview with family, facility staff/caregiver and/or Mr. Belshe.  I reviewed available labs, medications, imaging, studies and related documents from the EMR.  Records reviewed and summarized above.     Physical Exam: Pulse 72, resp 20  Constitutional: NAD General: frail appearing, thin, ill appearing EYES: anicteric sclera, lids intact, no discharge  ENMT: intact hearing, oral mucous membranes moist, voice weak, low in tone CV: S1S2, RRR, no LE edema Pulmonary: Left lobes clear, right lobes diminished,  no increased work of breathing, occasional non productive cough; room air Abdomen: normo-active BS + 4 quadrants, soft and non tender, GU: deferred MSK: sarcopenia, non-ambulatory Skin: cool and dry, wound vac to left hip, foam dressing to bilateral ankles Neuro: generalized weakness, A & O to person, place Psych: non-anxious affect, flat affect Hem/lymph/immuno: no widespread bruising   Thank you for the opportunity to participate in the care of Mr. Vanderpol.  The palliative  care team will continue to follow. Please call our office at [phone] if we can be of additional assistance.   Ezekiel Slocumb, NP   COVID-19 PATIENT SCREENING TOOL Asked and negative response unless otherwise noted:   Have you had symptoms of covid, tested positive or been in contact with someone with symptoms/positive test in the past 5-10 days? No

## 2021-03-20 ENCOUNTER — Telehealth: Payer: Self-pay

## 2021-03-20 DIAGNOSIS — S72002D Fracture of unspecified part of neck of left femur, subsequent encounter for closed fracture with routine healing: Secondary | ICD-10-CM | POA: Diagnosis not present

## 2021-03-20 DIAGNOSIS — F411 Generalized anxiety disorder: Secondary | ICD-10-CM | POA: Diagnosis not present

## 2021-03-20 DIAGNOSIS — M6281 Muscle weakness (generalized): Secondary | ICD-10-CM | POA: Diagnosis not present

## 2021-03-20 DIAGNOSIS — R2681 Unsteadiness on feet: Secondary | ICD-10-CM | POA: Diagnosis not present

## 2021-03-20 DIAGNOSIS — R4182 Altered mental status, unspecified: Secondary | ICD-10-CM | POA: Diagnosis not present

## 2021-03-20 DIAGNOSIS — I10 Essential (primary) hypertension: Secondary | ICD-10-CM | POA: Diagnosis not present

## 2021-03-20 DIAGNOSIS — T8452XD Infection and inflammatory reaction due to internal left hip prosthesis, subsequent encounter: Secondary | ICD-10-CM | POA: Diagnosis not present

## 2021-03-20 NOTE — Telephone Encounter (Signed)
Spoke with patient's wife and relayed Dr. Alcario Drought message. She states the patient will not be able to make it to his appointment tomorrow, but she can come by the office around 3/3:30 pm tomorrow 12/14 to sign for the medication.   Beryle Flock, RN

## 2021-03-20 NOTE — Telephone Encounter (Signed)
Patient's wife called wanting to know if he would be transitioned to oral antibiotics once his PICC is pulled on 12/16.   She states he was unable to make it to his appointment on the 9th because "he really does not need to be transported to appointments at this point." Patient is in hospice care.   Left voicemail with nursing at [facility] requesting labs be faxed over.   Beryle Flock, RN

## 2021-03-21 ENCOUNTER — Telehealth: Payer: Self-pay | Admitting: Student

## 2021-03-21 ENCOUNTER — Ambulatory Visit: Payer: Medicare Other | Admitting: Internal Medicine

## 2021-03-21 DIAGNOSIS — M6281 Muscle weakness (generalized): Secondary | ICD-10-CM | POA: Diagnosis not present

## 2021-03-21 DIAGNOSIS — R4182 Altered mental status, unspecified: Secondary | ICD-10-CM | POA: Diagnosis not present

## 2021-03-21 DIAGNOSIS — F411 Generalized anxiety disorder: Secondary | ICD-10-CM | POA: Diagnosis not present

## 2021-03-21 DIAGNOSIS — T8452XD Infection and inflammatory reaction due to internal left hip prosthesis, subsequent encounter: Secondary | ICD-10-CM | POA: Diagnosis not present

## 2021-03-21 DIAGNOSIS — S72002D Fracture of unspecified part of neck of left femur, subsequent encounter for closed fracture with routine healing: Secondary | ICD-10-CM | POA: Diagnosis not present

## 2021-03-21 DIAGNOSIS — R2681 Unsteadiness on feet: Secondary | ICD-10-CM | POA: Diagnosis not present

## 2021-03-21 NOTE — Telephone Encounter (Signed)
Sounds good. We have the paperwork ready for her to sign and the medication here for her to pick up. I will make sure everything is coordinated!

## 2021-03-21 NOTE — Telephone Encounter (Signed)
Palliative NP followed up with patient's wife regarding code status. She states that she has spoken to patient's children and they are all in agreement with DNR. NP answered additional questions she had about hospice services. She states patient daughter may still have some questions. Wife was on her way to pick up his PO antibiotics from ID. He will be switching to PO antibiotic when IV antibiotics complete on 03/23/21.   Facility NP notified of family being in agreement for DNR. Awaiting decision on proceeding with hospice evaluation. Family is encouraged to call with any questions or concerns that may arise.

## 2021-03-22 ENCOUNTER — Other Ambulatory Visit (HOSPITAL_COMMUNITY): Payer: Self-pay

## 2021-03-22 ENCOUNTER — Telehealth: Payer: Self-pay | Admitting: Student

## 2021-03-22 DIAGNOSIS — F411 Generalized anxiety disorder: Secondary | ICD-10-CM | POA: Diagnosis not present

## 2021-03-22 DIAGNOSIS — T8452XD Infection and inflammatory reaction due to internal left hip prosthesis, subsequent encounter: Secondary | ICD-10-CM | POA: Diagnosis not present

## 2021-03-22 DIAGNOSIS — R2681 Unsteadiness on feet: Secondary | ICD-10-CM | POA: Diagnosis not present

## 2021-03-22 DIAGNOSIS — T8189XA Other complications of procedures, not elsewhere classified, initial encounter: Secondary | ICD-10-CM | POA: Diagnosis not present

## 2021-03-22 DIAGNOSIS — S72002D Fracture of unspecified part of neck of left femur, subsequent encounter for closed fracture with routine healing: Secondary | ICD-10-CM | POA: Diagnosis not present

## 2021-03-22 DIAGNOSIS — R4182 Altered mental status, unspecified: Secondary | ICD-10-CM | POA: Diagnosis not present

## 2021-03-22 DIAGNOSIS — M6281 Muscle weakness (generalized): Secondary | ICD-10-CM | POA: Diagnosis not present

## 2021-03-22 DIAGNOSIS — L97522 Non-pressure chronic ulcer of other part of left foot with fat layer exposed: Secondary | ICD-10-CM | POA: Diagnosis not present

## 2021-03-22 NOTE — Telephone Encounter (Signed)
Palliative NP spoke with patient's wife. She agrees with proceeding with hospice evaluation. Patient reviewed with Hospice medical director; he is in agreement as well.   Mendel Ryder, NP at [facility] notified of that family ready for hospice. She will send over hospice order.

## 2021-03-23 ENCOUNTER — Telehealth: Payer: Self-pay

## 2021-03-23 DIAGNOSIS — F411 Generalized anxiety disorder: Secondary | ICD-10-CM | POA: Diagnosis not present

## 2021-03-23 DIAGNOSIS — R4182 Altered mental status, unspecified: Secondary | ICD-10-CM | POA: Diagnosis not present

## 2021-03-23 DIAGNOSIS — S72002D Fracture of unspecified part of neck of left femur, subsequent encounter for closed fracture with routine healing: Secondary | ICD-10-CM | POA: Diagnosis not present

## 2021-03-23 DIAGNOSIS — R2681 Unsteadiness on feet: Secondary | ICD-10-CM | POA: Diagnosis not present

## 2021-03-23 DIAGNOSIS — T8452XD Infection and inflammatory reaction due to internal left hip prosthesis, subsequent encounter: Secondary | ICD-10-CM | POA: Diagnosis not present

## 2021-03-23 DIAGNOSIS — M6281 Muscle weakness (generalized): Secondary | ICD-10-CM | POA: Diagnosis not present

## 2021-03-23 NOTE — Telephone Encounter (Signed)
Dr. Juleen China, would you be ok just calling it completed without these last doses?

## 2021-03-23 NOTE — Telephone Encounter (Signed)
Informed Colletta Maryland that patient could switch to oral abx omadacycline. Colletta Maryland stated that patient's wife was told that medication is over 7500 dollars and she hasn't picked it up. Patient is also about to go to hospice - paperwork was done yesterday. Patient will probably be admitted within next 48 hours.

## 2021-03-23 NOTE — Telephone Encounter (Signed)
Colletta Maryland, RN called to report patient pulled out his PICC line. Patient had 1 more dose of vancomycin and ertapenem today. Colletta Maryland wants to know if patient can have last dose of abx's through IV.    [facility] Call back # - Rathbun Dunn, [place]

## 2021-03-23 NOTE — Telephone Encounter (Signed)
Please let Jason Hartman know that we gave patient's wife 2 weeks of omadacycline samples yesterday and will have more to give her. They will not have to pay the very high copay. Thanks!

## 2021-03-24 ENCOUNTER — Emergency Department (HOSPITAL_COMMUNITY): Payer: Medicare Other

## 2021-03-24 ENCOUNTER — Emergency Department (HOSPITAL_COMMUNITY)
Admission: EM | Admit: 2021-03-24 | Discharge: 2021-03-24 | Disposition: A | Discharge status: Home / Self Care | Payer: Medicare Other | Attending: Emergency Medicine | Admitting: Emergency Medicine

## 2021-03-24 ENCOUNTER — Other Ambulatory Visit: Payer: Self-pay

## 2021-03-24 DIAGNOSIS — W06XXXA Fall from bed, initial encounter: Secondary | ICD-10-CM | POA: Insufficient documentation

## 2021-03-24 DIAGNOSIS — N1831 Chronic kidney disease, stage 3a: Secondary | ICD-10-CM | POA: Diagnosis not present

## 2021-03-24 DIAGNOSIS — Z7901 Long term (current) use of anticoagulants: Secondary | ICD-10-CM | POA: Insufficient documentation

## 2021-03-24 DIAGNOSIS — R0902 Hypoxemia: Secondary | ICD-10-CM | POA: Diagnosis not present

## 2021-03-24 DIAGNOSIS — Z7984 Long term (current) use of oral hypoglycemic drugs: Secondary | ICD-10-CM | POA: Insufficient documentation

## 2021-03-24 DIAGNOSIS — W19XXXA Unspecified fall, initial encounter: Secondary | ICD-10-CM

## 2021-03-24 DIAGNOSIS — I517 Cardiomegaly: Secondary | ICD-10-CM | POA: Diagnosis not present

## 2021-03-24 DIAGNOSIS — R519 Headache, unspecified: Secondary | ICD-10-CM | POA: Insufficient documentation

## 2021-03-24 DIAGNOSIS — I2510 Atherosclerotic heart disease of native coronary artery without angina pectoris: Secondary | ICD-10-CM | POA: Insufficient documentation

## 2021-03-24 DIAGNOSIS — I639 Cerebral infarction, unspecified: Secondary | ICD-10-CM | POA: Diagnosis not present

## 2021-03-24 DIAGNOSIS — Z515 Encounter for palliative care: Secondary | ICD-10-CM | POA: Insufficient documentation

## 2021-03-24 DIAGNOSIS — Z043 Encounter for examination and observation following other accident: Secondary | ICD-10-CM | POA: Insufficient documentation

## 2021-03-24 DIAGNOSIS — U071 COVID-19: Secondary | ICD-10-CM | POA: Insufficient documentation

## 2021-03-24 DIAGNOSIS — S0990XA Unspecified injury of head, initial encounter: Secondary | ICD-10-CM | POA: Diagnosis not present

## 2021-03-24 DIAGNOSIS — E1122 Type 2 diabetes mellitus with diabetic chronic kidney disease: Secondary | ICD-10-CM | POA: Diagnosis not present

## 2021-03-24 DIAGNOSIS — Z794 Long term (current) use of insulin: Secondary | ICD-10-CM | POA: Diagnosis not present

## 2021-03-24 DIAGNOSIS — R410 Disorientation, unspecified: Secondary | ICD-10-CM | POA: Diagnosis not present

## 2021-03-24 DIAGNOSIS — Z79899 Other long term (current) drug therapy: Secondary | ICD-10-CM | POA: Insufficient documentation

## 2021-03-24 DIAGNOSIS — Z8554 Personal history of malignant neoplasm of ureter: Secondary | ICD-10-CM | POA: Insufficient documentation

## 2021-03-24 DIAGNOSIS — I959 Hypotension, unspecified: Secondary | ICD-10-CM | POA: Diagnosis not present

## 2021-03-24 DIAGNOSIS — J9811 Atelectasis: Secondary | ICD-10-CM | POA: Diagnosis not present

## 2021-03-24 DIAGNOSIS — J90 Pleural effusion, not elsewhere classified: Secondary | ICD-10-CM | POA: Diagnosis not present

## 2021-03-24 DIAGNOSIS — Z7401 Bed confinement status: Secondary | ICD-10-CM | POA: Diagnosis not present

## 2021-03-24 DIAGNOSIS — Z87891 Personal history of nicotine dependence: Secondary | ICD-10-CM | POA: Diagnosis not present

## 2021-03-24 DIAGNOSIS — I129 Hypertensive chronic kidney disease with stage 1 through stage 4 chronic kidney disease, or unspecified chronic kidney disease: Secondary | ICD-10-CM | POA: Diagnosis not present

## 2021-03-24 DIAGNOSIS — E1142 Type 2 diabetes mellitus with diabetic polyneuropathy: Secondary | ICD-10-CM | POA: Diagnosis not present

## 2021-03-24 DIAGNOSIS — F29 Unspecified psychosis not due to a substance or known physiological condition: Secondary | ICD-10-CM | POA: Diagnosis not present

## 2021-03-24 DIAGNOSIS — Z743 Need for continuous supervision: Secondary | ICD-10-CM | POA: Diagnosis not present

## 2021-03-24 LAB — CBC WITH DIFFERENTIAL/PLATELET
Abs Immature Granulocytes: 0.04 10*3/uL (ref 0.00–0.07)
Basophils Absolute: 0 10*3/uL (ref 0.0–0.1)
Basophils Relative: 1 %
Eosinophils Absolute: 0.3 10*3/uL (ref 0.0–0.5)
Eosinophils Relative: 3 %
HCT: 39.8 % (ref 39.0–52.0)
Hemoglobin: 11.8 g/dL — ABNORMAL LOW (ref 13.0–17.0)
Immature Granulocytes: 1 %
Lymphocytes Relative: 11 %
Lymphs Abs: 0.9 10*3/uL (ref 0.7–4.0)
MCH: 27.2 pg (ref 26.0–34.0)
MCHC: 29.6 g/dL — ABNORMAL LOW (ref 30.0–36.0)
MCV: 91.7 fL (ref 80.0–100.0)
Monocytes Absolute: 0.5 10*3/uL (ref 0.1–1.0)
Monocytes Relative: 5 %
Neutro Abs: 6.5 10*3/uL (ref 1.7–7.7)
Neutrophils Relative %: 79 %
Platelets: 207 10*3/uL (ref 150–400)
RBC: 4.34 MIL/uL (ref 4.22–5.81)
RDW: 14.6 % (ref 11.5–15.5)
WBC: 8.3 10*3/uL (ref 4.0–10.5)
nRBC: 0 % (ref 0.0–0.2)

## 2021-03-24 LAB — COMPREHENSIVE METABOLIC PANEL
ALT: 6 U/L (ref 0–44)
AST: 14 U/L — ABNORMAL LOW (ref 15–41)
Albumin: 2.2 g/dL — ABNORMAL LOW (ref 3.5–5.0)
Alkaline Phosphatase: 70 U/L (ref 38–126)
Anion gap: 4 — ABNORMAL LOW (ref 5–15)
BUN: 15 mg/dL (ref 8–23)
CO2: 28 mmol/L (ref 22–32)
Calcium: 8.2 mg/dL — ABNORMAL LOW (ref 8.9–10.3)
Chloride: 106 mmol/L (ref 98–111)
Creatinine, Ser: 0.89 mg/dL (ref 0.61–1.24)
GFR, Estimated: >60 mL/min (ref >60)
Glucose, Bld: 95 mg/dL (ref 70–99)
Potassium: 3.7 mmol/L (ref 3.5–5.1)
Sodium: 138 mmol/L (ref 135–145)
Total Bilirubin: 0.3 mg/dL (ref 0.3–1.2)
Total Protein: 5.1 g/dL — ABNORMAL LOW (ref 6.5–8.1)

## 2021-03-24 LAB — RESP PANEL BY RT-PCR (FLU A&B, COVID) ARPGX2
Influenza A by PCR: NEGATIVE
Influenza B by PCR: NEGATIVE
SARS Coronavirus 2 by RT PCR: POSITIVE — AB

## 2021-03-24 IMAGING — CT CT CERVICAL SPINE W/O CM
3 of 4 series · 13 of 33 positions shown, 16 images · non-contrast
Comparison: [DATE]

CLINICAL DATA: Status post fall.

EXAM:
CT CERVICAL SPINE WITHOUT CONTRAST
TECHNIQUE: Multidetector CT imaging of the cervical spine was performed without
intravenous contrast. Multiplanar CT image reconstructions were also
generated.

[Series 4: c_spine 2.0 st · axial · 0.34mm/px · z∈[-368,-240]mm · 5 of 96 slices shown, 7 images]
[im 16/96  soft-tissue]
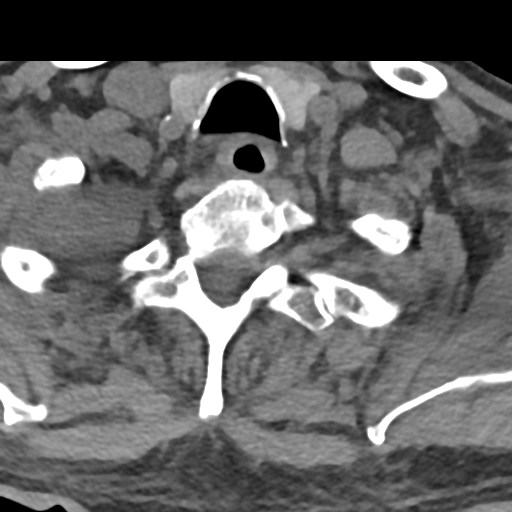
[im 16/96  bone]
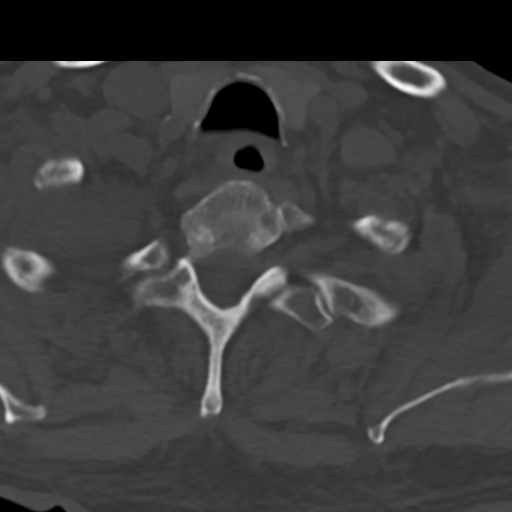
[im 32/96  bone]
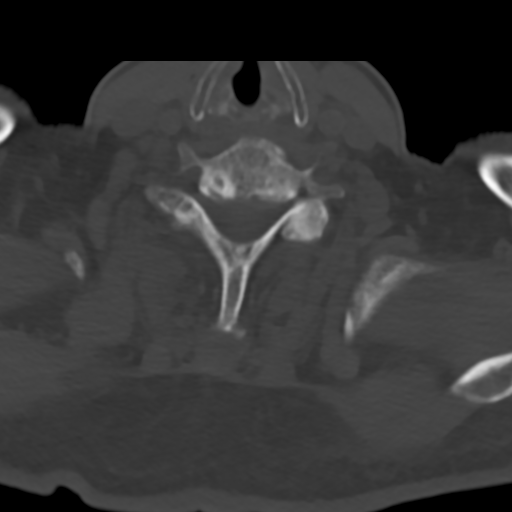
[im 48/96  bone]
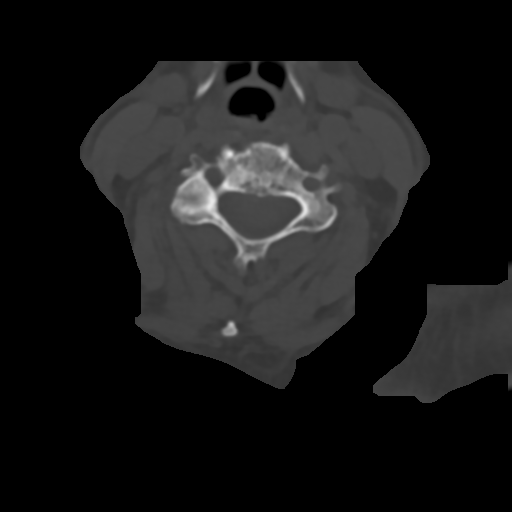
[im 64/96  bone]
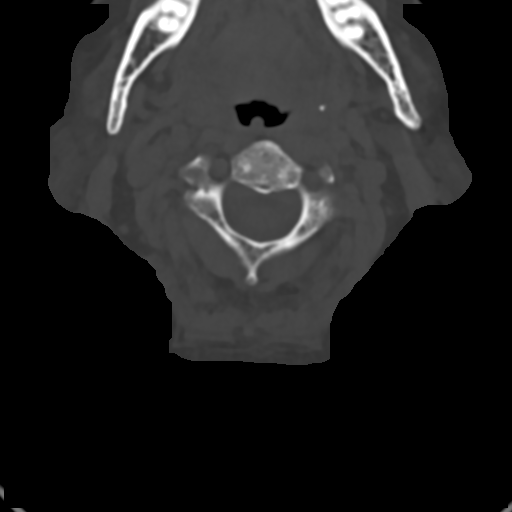
[im 80/96  soft-tissue]
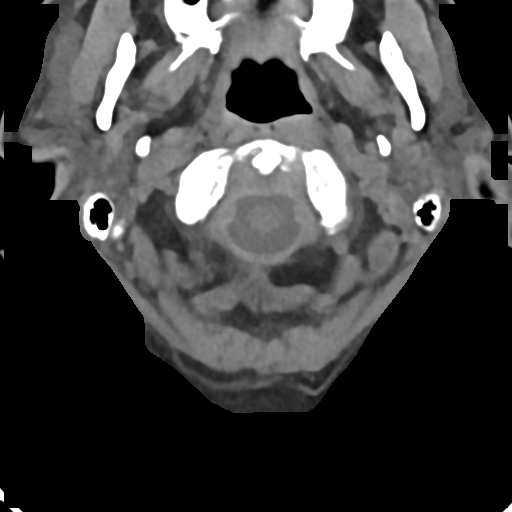
[im 80/96  bone]
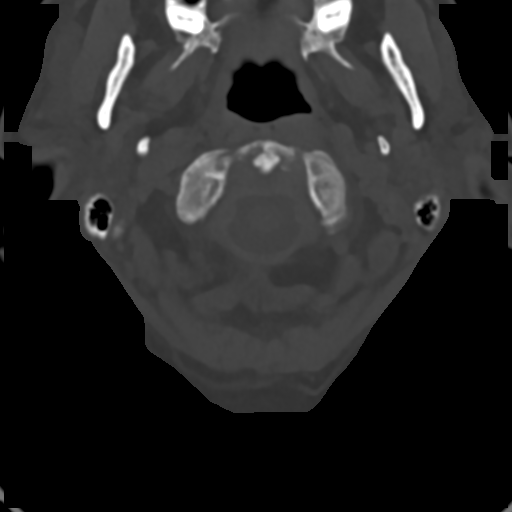

[Series 8: c_spine 2.0 sag bone · sagittal · 0.28mm/px · 5 of 61 slices shown, 6 images]
[im 21/61  bone]
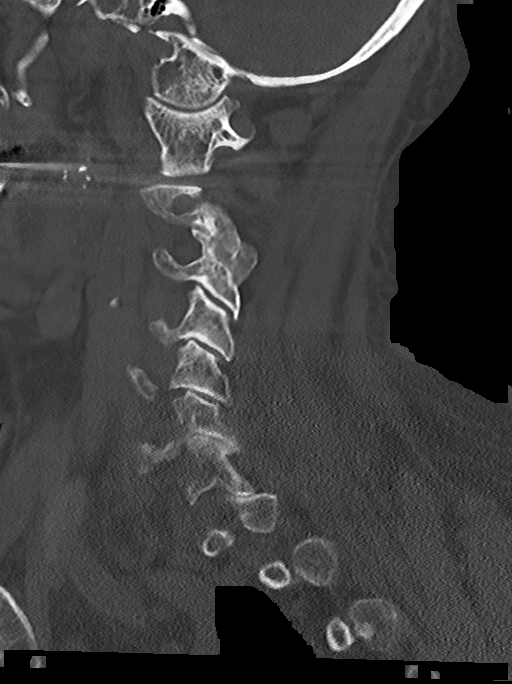
[im 26/61  bone]
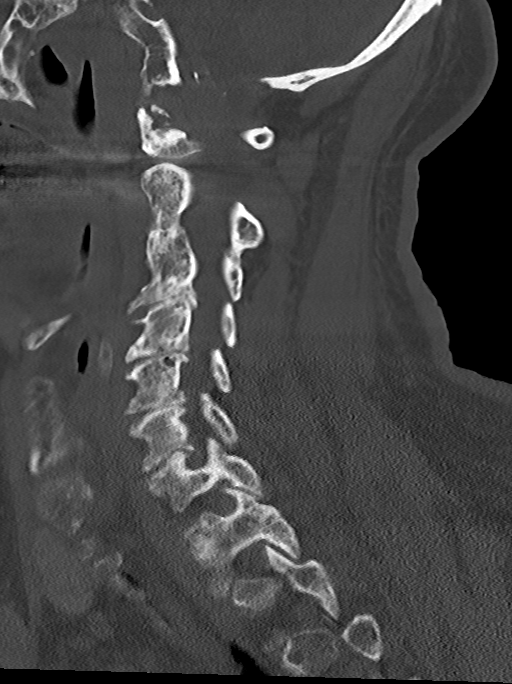
[im 31/61  soft-tissue]
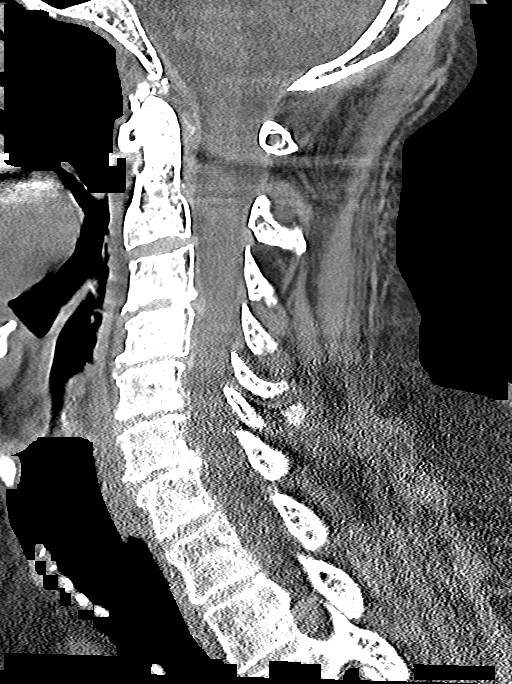
[im 31/61  bone]
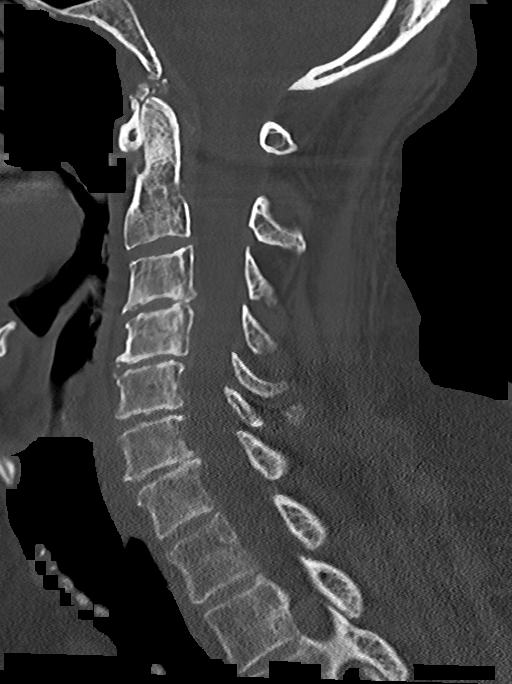
[im 36/61  bone]
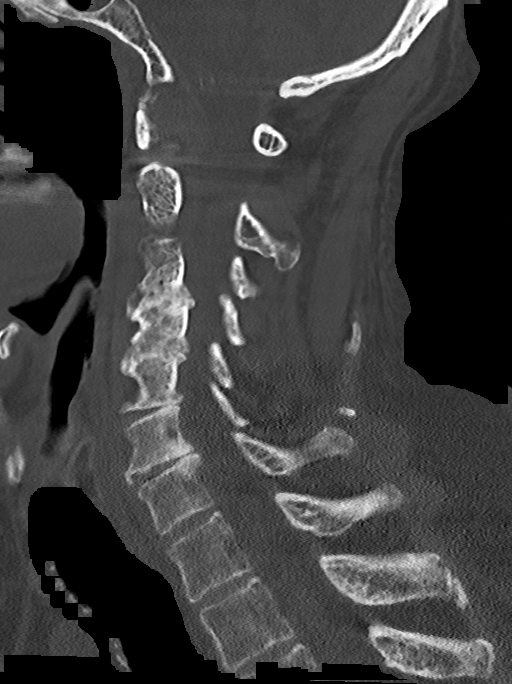
[im 41/61  bone]
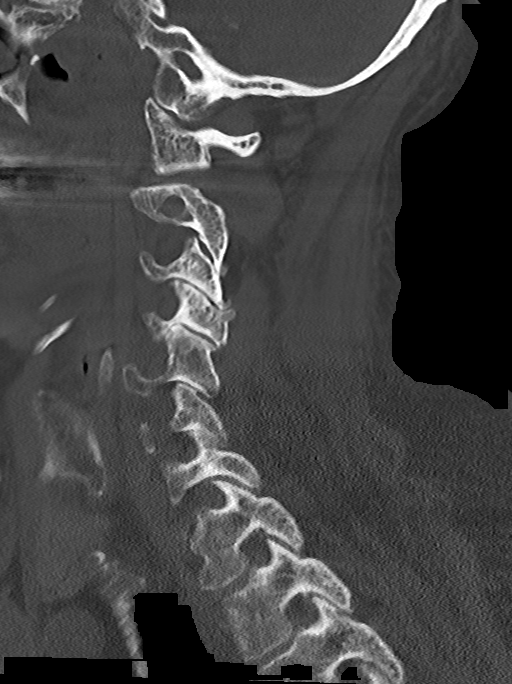

[Series 9: c_spine 2.0 cor bone · coronal · 0.28mm/px · 3 of 61 slices shown]
[im 13/61  bone]
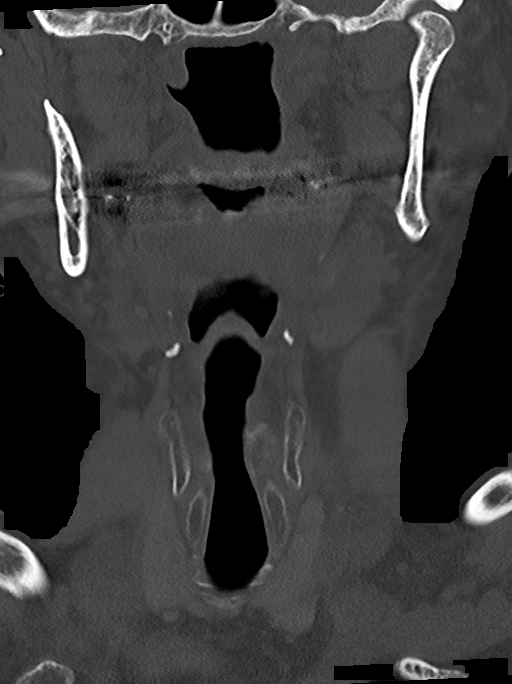
[im 25/61  bone]
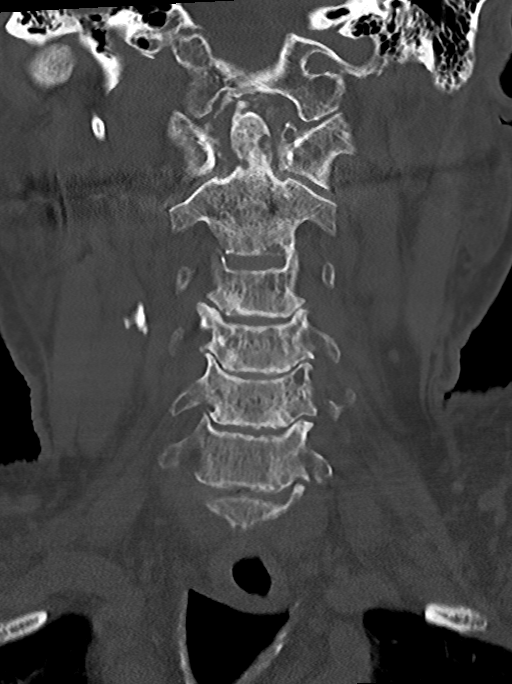
[im 37/61  bone]
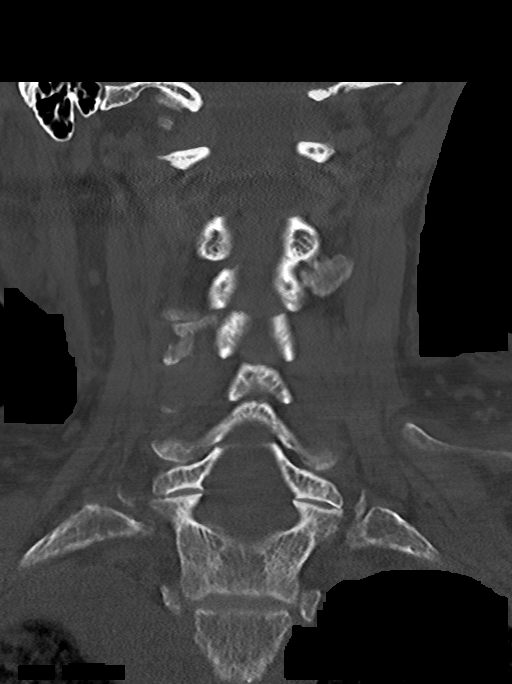

[13 of 33 positions shown; findings below may reference images not displayed]

FINDINGS: Alignment: Normal.

Skull base and vertebrae: No acute fracture. A stable chronic
deformity is seen above the anterior atlantoaxial articulation,
adjacent to the anterior aspect of the tip of the dens. Chronic and
degenerative changes are seen involving the body and tip of the
dens.

Soft tissues and spinal canal: No prevertebral fluid or swelling. No
visible canal hematoma.

Disc levels: Moderate to marked severity endplate sclerosis is seen
at the levels of C3-C4, C4-C5, C5-C6 and C6-C7.

There is moderate severity narrowing of the anterior atlantoaxial
articulation. Moderate to marked severity intervertebral disc space
narrowing is also seen at C3-C4, C4-C5, C5-C6 and C6-C7.

Bilateral marked severity multilevel facet joint hypertrophy is
noted, with fusion of the C2-C3 facet on the left.

Upper chest: There is a large right-sided pleural effusion. A
moderate size left pleural effusion is also noted.

Other: None.
IMPRESSION: 1. No acute fracture within the cervical spine.
2. Marked severity chronic and multilevel degenerative changes, as
described above.
3. Large right-sided pleural effusion.
4. Moderate size left pleural effusion.

## 2021-03-24 IMAGING — CT CT HEAD W/O CM
4 series · 16 of 47 positions shown, 18 images · non-contrast
Comparison: [DATE]

CLINICAL DATA: Head trauma, moderate-severe.  Unwitnessed fall.

EXAM:
CT HEAD WITHOUT CONTRAST
TECHNIQUE: Contiguous axial images were obtained from the base of the skull
through the vertex without intravenous contrast.

[Series 3: head without · axial · non-contrast · 0.42mm/px · z∈[-224,-88]mm · 7 of 37 slices shown, 9 images]
[im 5/37  brain]
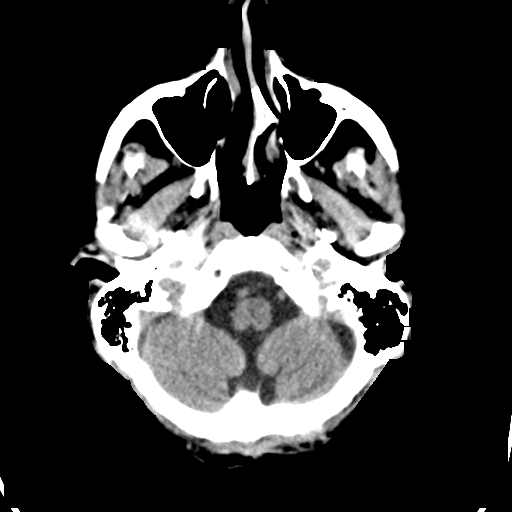
[im 5/37  bone]
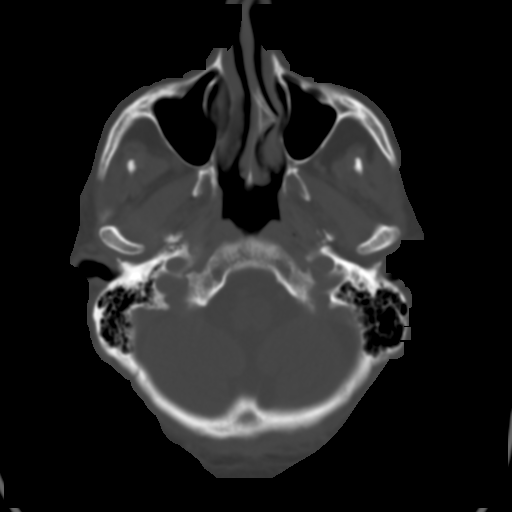
[im 10/37  brain]
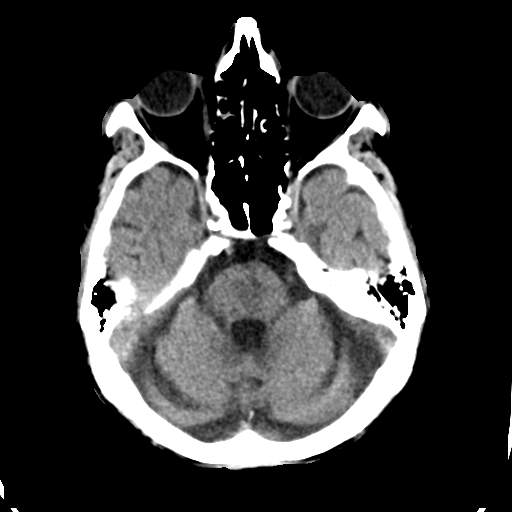
[im 14/37  brain]
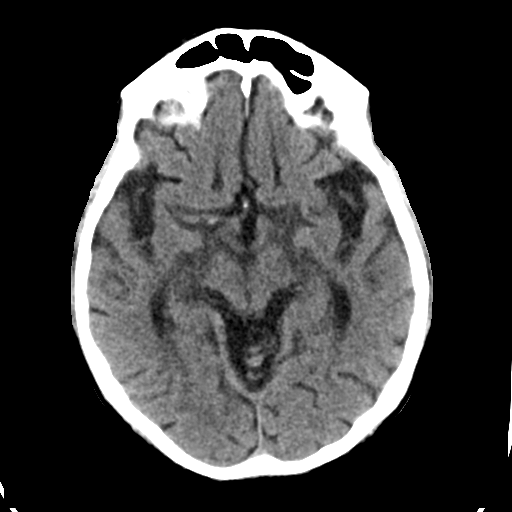
[im 19/37  brain]
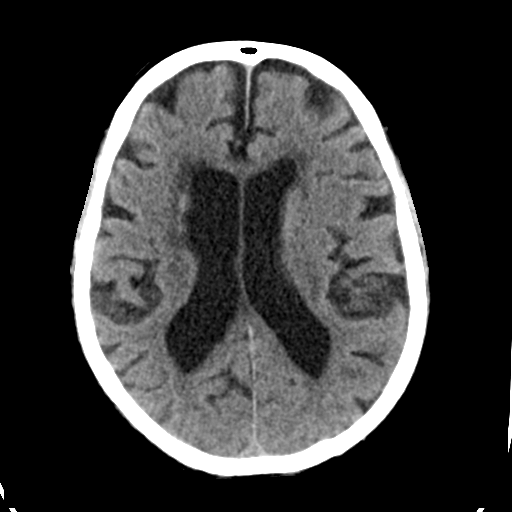
[im 23/37  brain]
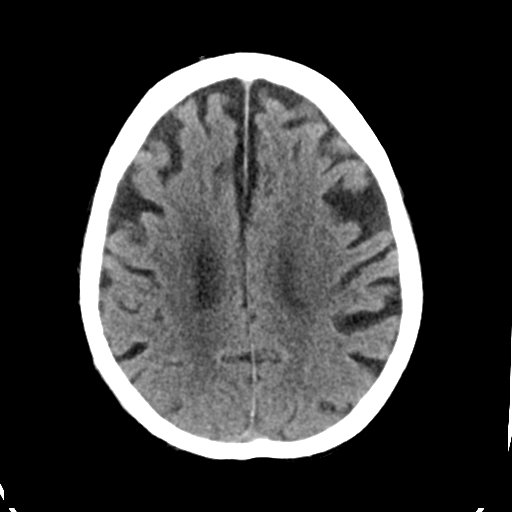
[im 23/37  bone]
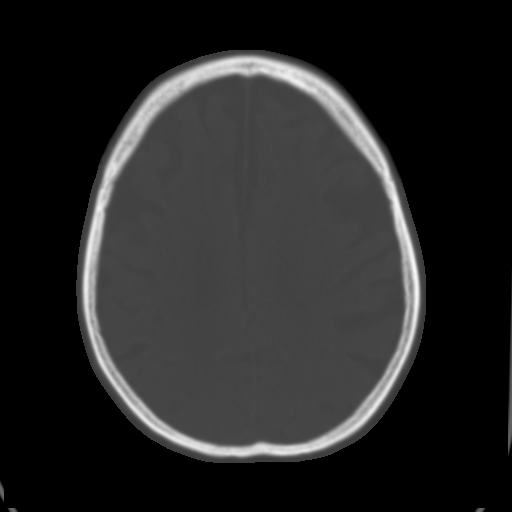
[im 28/37  brain]
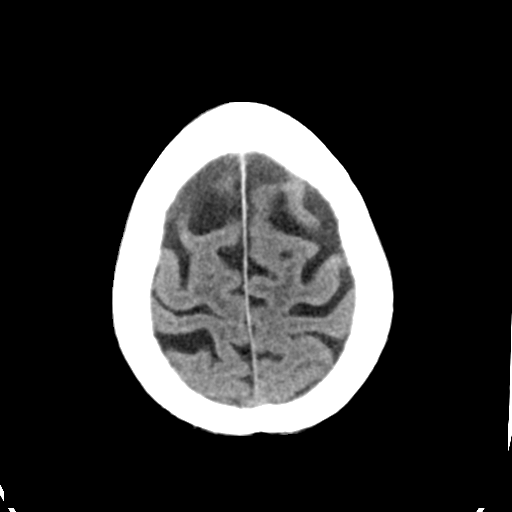
[im 32/37  brain]
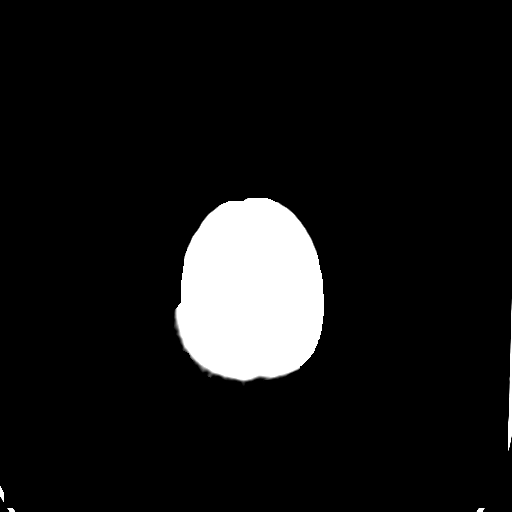

[Series 4: head bone · axial · 0.42mm/px · z∈[-226,-190]mm · 3 of 91 slices shown]
[im 10/91  bone]
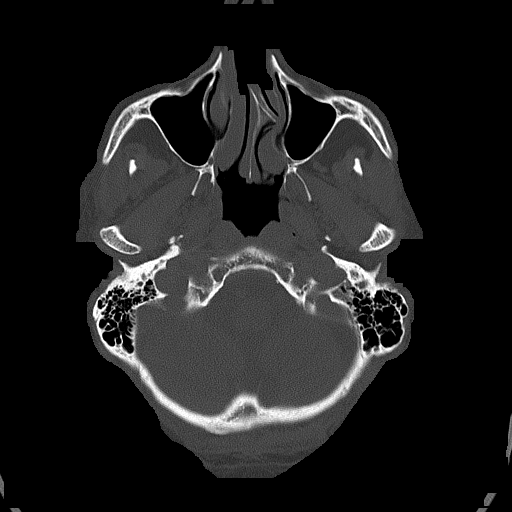
[im 19/91  bone]
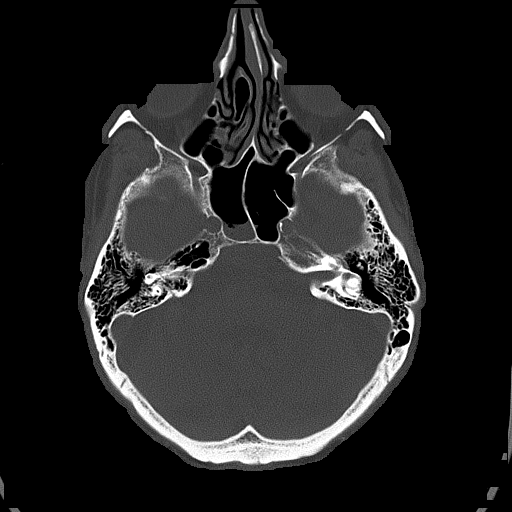
[im 28/91  bone]
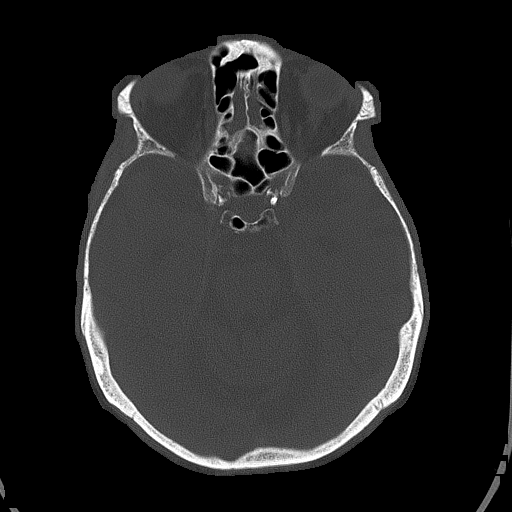

[Series 5: head without cor · coronal · non-contrast · 0.33mm/px · 3 of 69 slices shown]
[im 23/69  brain]
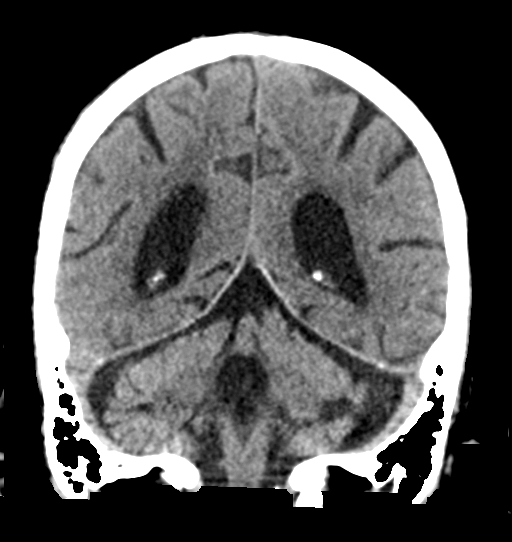
[im 31/69  brain]
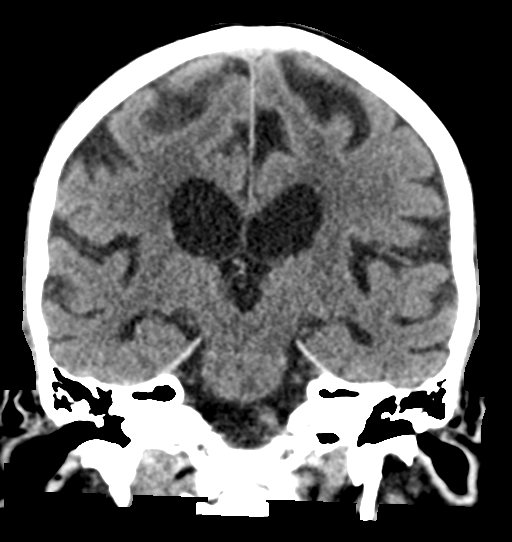
[im 38/69  brain]
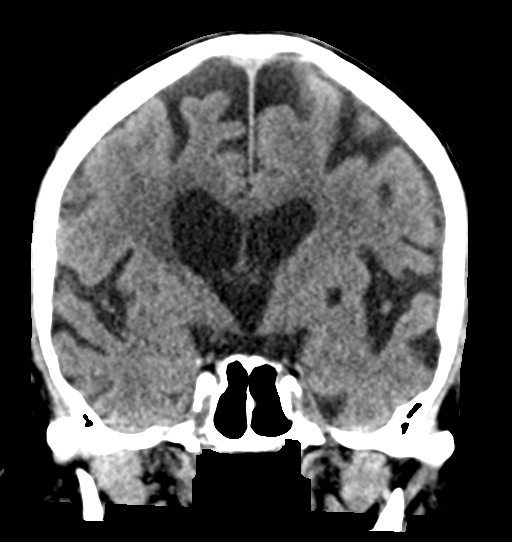

[Series 6: head without sag · sagittal · non-contrast · 0.35mm/px · 3 of 59 slices shown]
[im 20/59  brain]
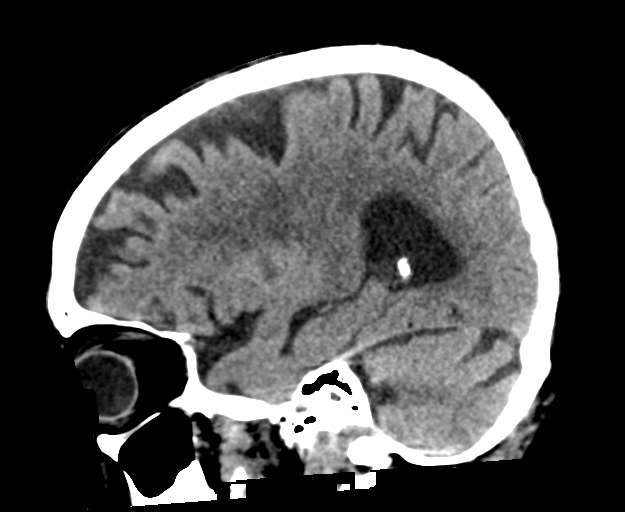
[im 30/59  brain]
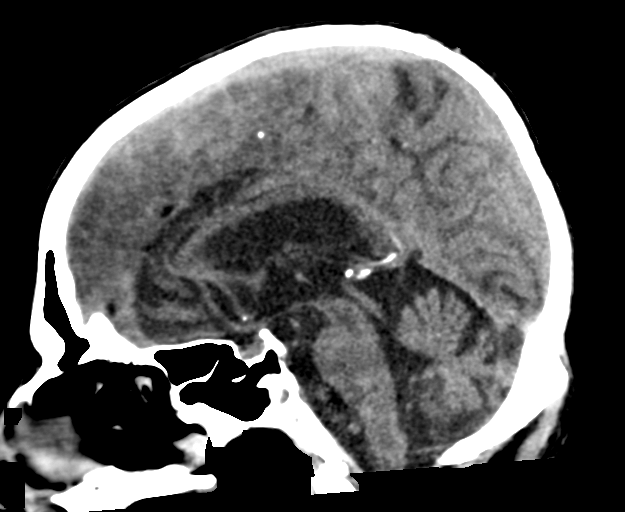
[im 39/59  brain]
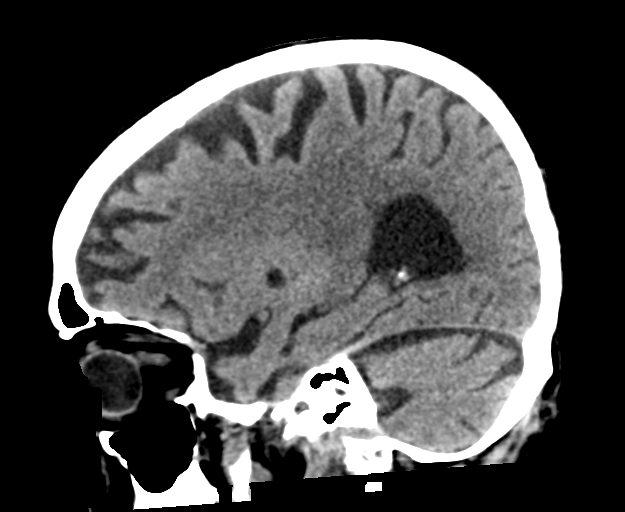

[16 of 47 positions shown; findings below may reference images not displayed]

FINDINGS: Brain: Normal anatomic configuration. Parenchymal volume loss is
commensurate with the patient's age. Mild periventricular white
matter changes are present likely reflecting the sequela of small
vessel ischemia. Remote lacunar infarcts are noted within the basal
ganglia bilaterally and right corona radiata. No abnormal intra or
extra-axial mass lesion or fluid collection. No abnormal mass effect
or midline shift. No evidence of acute intracranial hemorrhage or
infarct. Ventricular size is normal. Cerebellum unremarkable.

Vascular: No asymmetric hyperdense vasculature at the skull base.

Skull: Intact

Sinuses/Orbits: Paranasal sinuses are clear. Orbits are
unremarkable.

Other: Mastoid air cells and middle ear cavities are clear.
IMPRESSION: No acute intracranial injury. No calvarial fracture. Stable
senescent change and remote infarcts.

## 2021-03-24 IMAGING — DX DG CHEST 1V
1 series · 1 of 1 positions shown · non-contrast
Comparison: Portable chest [DATE].

CLINICAL DATA: Encounter for fall injury.

EXAM:
CHEST  1 VIEW

[chest ap]
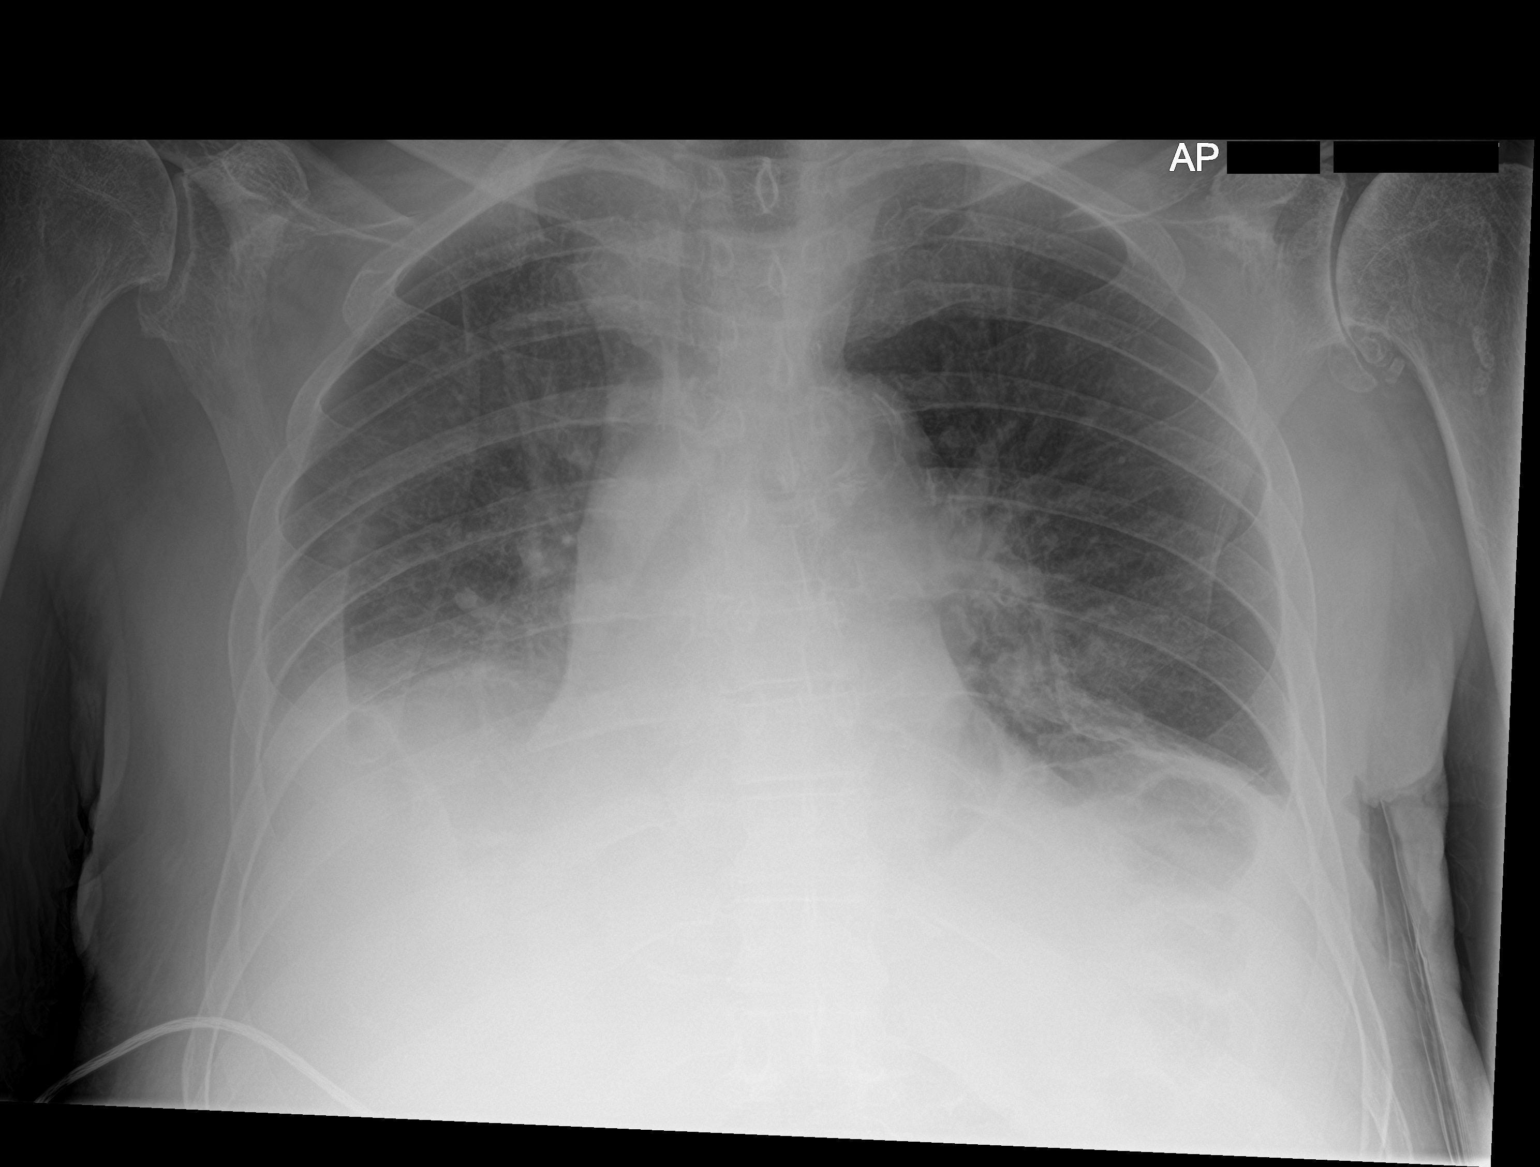

[1 of 1 positions shown; findings below may reference images not displayed]

FINDINGS: Small to moderate interval increased layering right pleural effusion
is noted with overlying atelectasis or consolidation. There is a
small left pleural effusion with adjacent atelectatic bands.

Remaining lungs are clear. There is cardiomegaly without findings of
CHF.

There is aortic tortuosity and calcification. Interval removal left
PICC.
IMPRESSION: 1. Small to moderate-sized right pleural effusion with overlying
atelectasis or consolidation.
2. Small left pleural effusion with adjacent linear atelectasis.
3. Cardiomegaly.

## 2021-03-24 IMAGING — DX DG HIP (WITH OR WITHOUT PELVIS) 2-3V*L*
3 series · 3 of 3 positions shown · non-contrast
Comparison: [DATE]

CLINICAL DATA: Status post fall.

EXAM:
DG HIP (WITH OR WITHOUT PELVIS) 2-3V LEFT

[pelvis ap]
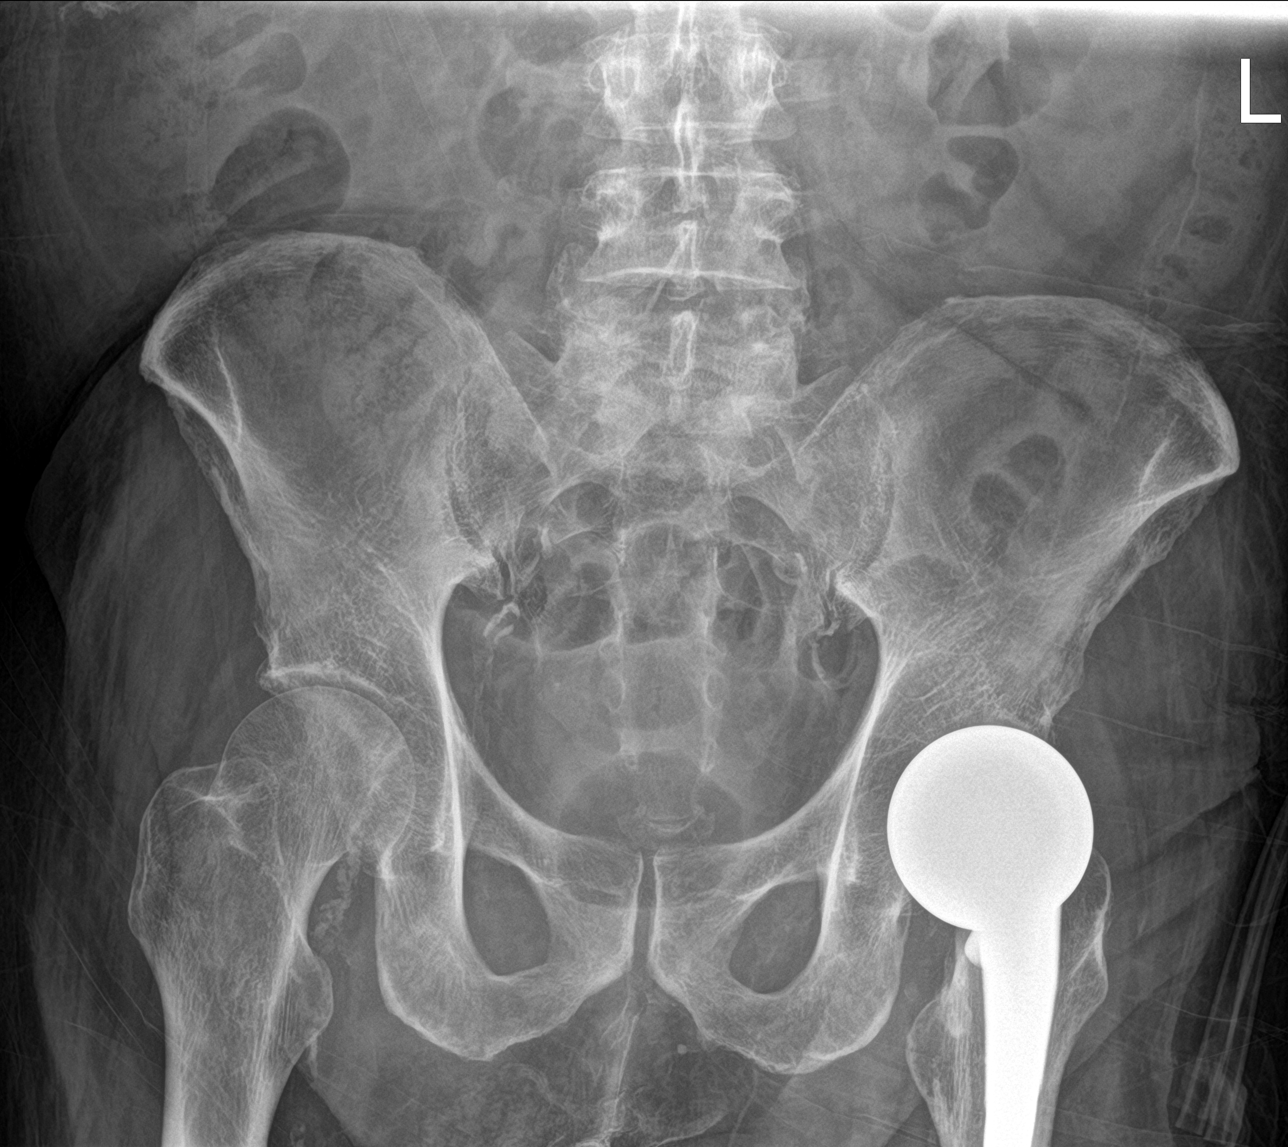

[hip ap]
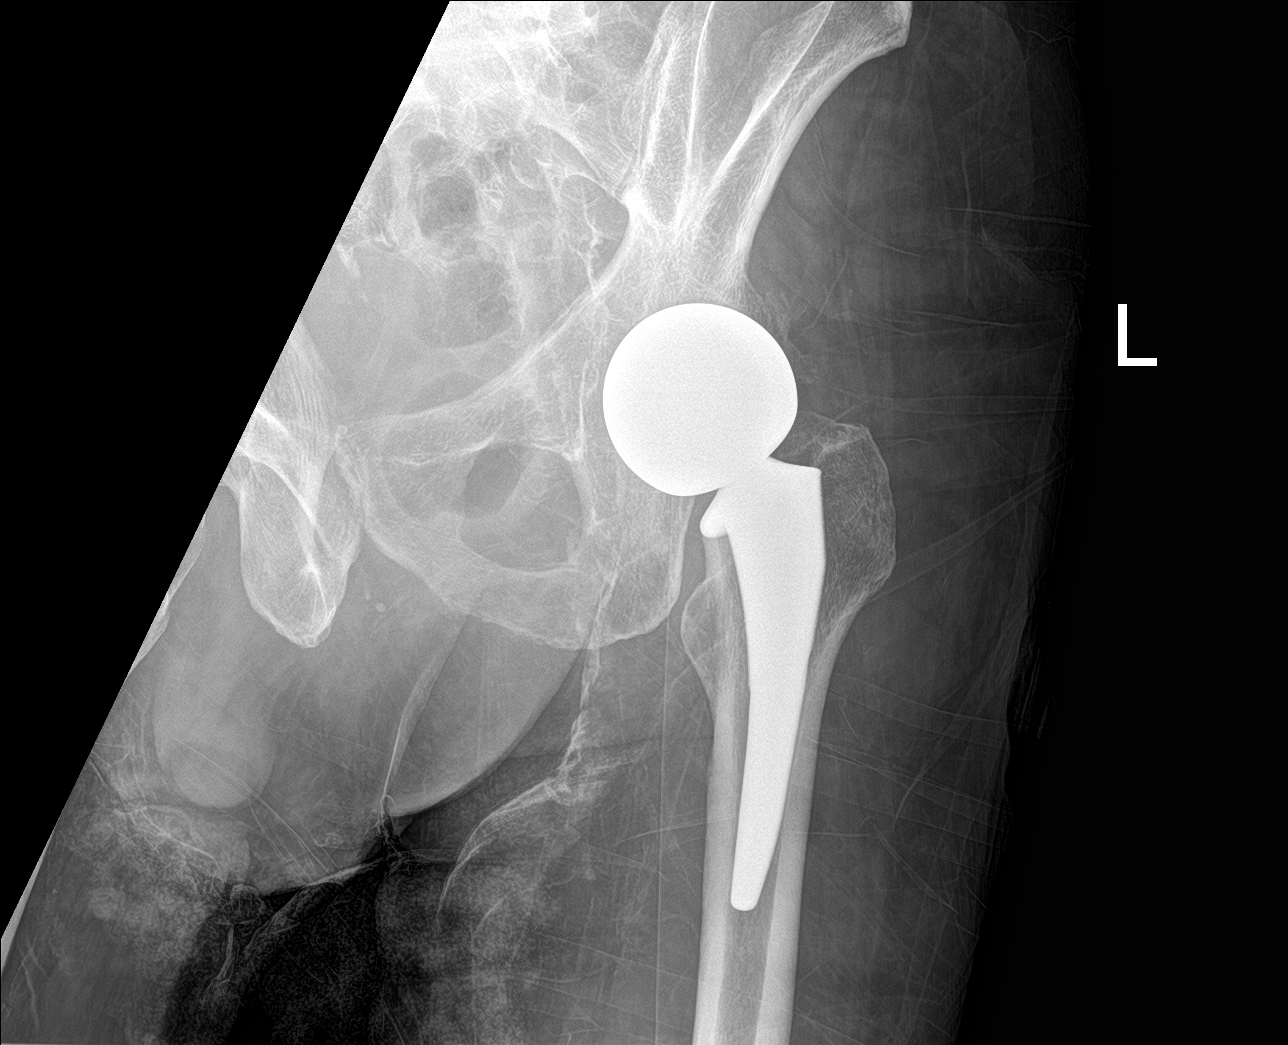

[hip lat]
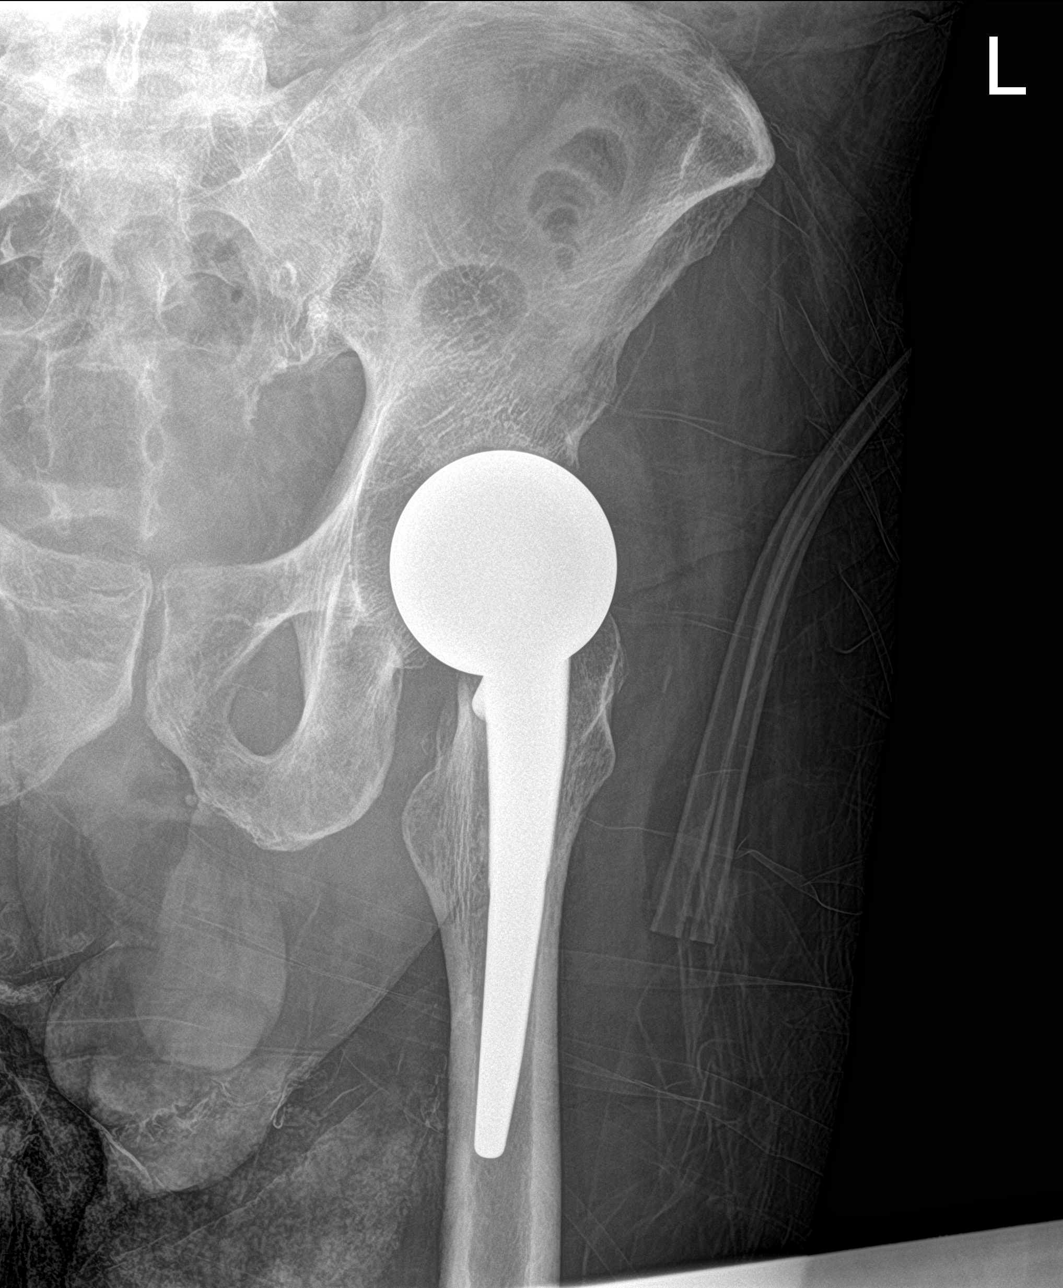

[3 of 3 positions shown; findings below may reference images not displayed]

FINDINGS: There is no evidence of an acute hip fracture or dislocation. A left
hip replacement is seen without evidence of surrounding lucency to
suggest the presence of hardware loosening or infection. This
represents a new finding when compared to the prior study.
Degenerative changes are seen involving the visualized portion of
the right hip, in the form of joint space narrowing and acetabular
sclerosis.
IMPRESSION: 1. No acute fracture or dislocation.
2. Left hip replacement without evidence of hardware loosening or
infection.

## 2021-03-24 NOTE — ED Triage Notes (Signed)
BIB [facility] for fall. Facility found him with head off the bed on the floor with feet still in bed. AMS at baseline. No sure how long was like that. Unknown LOC. Abrasion to the bridge on nose. Small hematoma to forehead. Med vac in place possible MRSA?  97% 3L [place] baseline 132/64 60 heart rate CBG 125

## 2021-03-24 NOTE — ED Notes (Signed)
[facility] called and I spoke with Tiffany aware patient is Covid positive

## 2021-03-24 NOTE — Discharge Instructions (Signed)
Follow-up with your hospice team tomorrow.  Return to the ER if you have pain needs are not being met with hospice if you have new concerns or if you have any additional questions.

## 2021-03-24 NOTE — ED Notes (Signed)
Pt is incontinent of urine and feces. Changed by this RN twice to a clean and dry diaper and bed linens.

## 2021-03-24 NOTE — ED Provider Notes (Signed)
[facility] EMERGENCY DEPARTMENT Provider Note   CSN: [id number] Arrival date & time: 03/24/21  0112     History Chief Complaint  Patient presents with   [name]    Jason Hartman is a 85 y.o. male.  Patient presents from a nursing facility, chief complaint of fall out of bed.  Unknown how long he had fallen.  No other reports of fevers or cough or vomiting or diarrhea.  Patient's history is complicated with history of hip fracture and subsequent infection now on a wound VAC.  Nursing home had diagnosed pneumonia and the patient was on Levaquin per family as well.  No other reports of fevers or cough or vomiting.  Patient is on 3 L nasal cannula at baseline which she continues to use now.      Past Medical History:  Diagnosis Date   CAD (coronary artery disease)    s/p stents   Cellulitis and abscess of leg, except foot    Hypertension    Obstructive sleep apnea (adult) (pediatric)    Restless legs syndrome (RLS)    Thoracic or lumbosacral neuritis or radiculitis, unspecified    Type II or unspecified type diabetes mellitus without mention of complication, not stated as uncontrolled    Unspecified disease of pericardium    Unspecified hereditary and idiopathic peripheral neuropathy     Patient Active Problem List   Diagnosis Date Noted   MRSA infection    ESBL (extended spectrum beta-lactamase) producing bacteria infection 01/27/2021   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   Hip fracture ([place]) 12/29/2020   Malnutrition of moderate degree 10/27/2020   UTI (urinary tract infection) 10/25/2020   Sepsis ([place]) 10/12/2020   AMS (altered mental status)    PAF (paroxysmal atrial fibrillation) (HCC)    Acute metabolic encephalopathy 23/55/7322   Anxiety 09/09/2020   SIRS (systemic inflammatory response syndrome) ([place]) 09/09/2020   Diaphoresis    Slow transit constipation 08/07/2020   Abdominal aortic aneurysm without rupture 03/17/2020    Abnormal gait 03/17/2020   Ascending aorta dilatation ([place]) 03/17/2020   Diabetic renal disease ([place]) 03/17/2020   Recurrent UTI 07/16/2019   Hypertensive urgency 04/26/2019   Malignant HTN with heart disease, w/o CHF, w/o chronic kidney disease 04/26/2019   Elevated troponin    LFTs abnormal    Statin intolerance 01/15/2019   Penis pain 04/10/2018   Urethra cancer ([place]) 02/25/2018   Lower extremity edema 06/09/2017   Peptic ulcer disease 05/16/2017   GERD (gastroesophageal reflux disease) 05/16/2017   Chronic kidney disease, stage 3a ([place]) 04/11/2017   Lower urinary tract symptoms (LUTS) 04/15/2016   Narcotic dependence ([place]) 08/31/2015   Imbalance 08/31/2015   Diabetic peripheral neuropathy ([place]) 08/31/2015   Other malaise and fatigue 05/02/2015   Chronic fatigue 05/02/2015   Panic disorder without agoraphobia 03/22/2015   Hyperlipidemia 02/03/2014   Thrombocytopenia ([place]) 08/18/2013   Parkinsonism ([place]) 08/18/2013   Other disorders of lung 08/18/2013   Organic impotence 08/18/2013   Memory loss 08/18/2013   Low back pain 08/18/2013   Iron deficiency anemia 08/18/2013   Hypercalcemia 08/18/2013   Cellulitis of right leg 08/18/2013   Atherosclerotic heart disease of native coronary artery without angina pectoris 08/18/2013   RLS (restless legs syndrome) 09/01/2012   HERPES SIMPLEX INFECTION 11/06/2006   Type II diabetes mellitus ([place]) 11/06/2006   HYPOGONADISM 11/06/2006   VITAMIN D DEFICIENCY 11/06/2006   ANXIETY 11/06/2006   OBSTRUCTIVE SLEEP APNEA 11/06/2006   RESTLESS LEG  SYNDROME 11/06/2006   Essential hypertension 11/06/2006   BENIGN PROSTATIC HYPERTROPHY 11/06/2006   ROSACEA 11/06/2006   OSTEOARTHRITIS 11/06/2006   INSOMNIA 11/06/2006   HYPERGLYCEMIA 11/06/2006   COLONIC POLYPS, HX OF 11/06/2006    Past Surgical History:  Procedure Laterality Date   ANAL FISSURE REPAIR     ANTERIOR APPROACH HEMI HIP ARTHROPLASTY Left 12/30/2020   Procedure: ANTERIOR  APPROACH HIP ARTHROPLASTY;  Surgeon: Vanetta Mulders, MD;  Location: [place];  Service: Orthopedics;  Laterality: Left;   APPLICATION OF WOUND VAC Left 01/26/2021   Procedure: APPLICATION OF WOUND VAC;  Surgeon: Vanetta Mulders, MD;  Location: [place];  Service: Orthopedics;  Laterality: Left;   I & D EXTREMITY Left 01/26/2021   Procedure: IRRIGATION AND DEBRIDEMENT LEFT HIP;  Surgeon: Vanetta Mulders, MD;  Location: [place];  Service: Orthopedics;  Laterality: Left;  30 MIN   pericarditis  2009       Family History  Problem Relation Age of Onset   Diabetes Father     Social History   Tobacco Use   Smoking status: Former    Types: Cigarettes    Quit date: 09/01/1980    Years since quitting: 40.5   Smokeless tobacco: Never  Vaping Use   Vaping Use: Never used  Substance Use Topics   Alcohol use: Not Currently    Comment: rare   Drug use: Never    Home Medications Prior to Admission medications   Medication Sig Start Date End Date Taking? Authorizing Provider  acetaminophen (TYLENOL) 500 MG tablet Take 1,000 mg by mouth every 8 (eight) hours.    [provider]  Acidophilus Lactobacillus CAPS Take 1 capsule by mouth every evening.    [provider]  amiodarone (PACERONE) 200 MG tablet Take 1 tablet (200 mg total) by mouth daily. 09/26/20   Mercy Riding, MD  apixaban (ELIQUIS) 5 MG TABS tablet Take 1 tablet (5 mg total) by mouth 2 (two) times daily. 02/01/21   Barb Merino, MD  clonazePAM (KLONOPIN) 0.25 MG disintegrating tablet Take 1 tablet (0.25 mg total) by mouth every morning. 02/01/21   Barb Merino, MD  clonazePAM (KLONOPIN) 0.5 MG tablet Take 1 tablet (0.5 mg total) by mouth at bedtime. 02/01/21   Barb Merino, MD  famotidine (PEPCID) 20 MG tablet Take 20 mg by mouth every morning.    [provider]  ferrous sulfate 325 (65 FE) MG EC tablet Take 325 mg by mouth every morning. 05/28/18   [provider]  gabapentin (NEURONTIN) 300 MG  capsule Take 1 capsule (300 mg total) by mouth at bedtime. 09/25/20   Mercy Riding, MD  LANTUS SOLOSTAR 100 UNIT/ML Solostar Pen Inject 8 Units into the skin 2 (two) times daily. 09/25/20   Mercy Riding, MD  melatonin 5 MG TABS Take 5 mg by mouth at bedtime.    [provider]  metFORMIN (GLUCOPHAGE-XR) 500 MG 24 hr tablet Take 500 mg by mouth daily with breakfast. 01/17/20   [provider]  methocarbamol (ROBAXIN) 500 MG tablet Take 1 tablet (500 mg total) by mouth every 6 (six) hours as needed for muscle spasms. 01/08/21   Amin, Jeanella Flattery, MD  metoprolol tartrate (LOPRESSOR) 25 MG tablet Take 0.5 tablets (12.5 mg total) by mouth 2 (two) times daily. 09/25/20   Mercy Riding, MD  nystatin (MYCOSTATIN/NYSTOP) powder Apply 1 application topically 2 (two) times daily. To rash on back, groin, and sacrum    [provider]  OLANZapine (ZYPREXA) 2.5 MG tablet Take 1 tablet (2.5 mg total) by mouth at bedtime. 02/01/21 03/03/21  Barb Merino, MD  polyethylene glycol powder (GLYCOLAX/MIRALAX) 17 GM/SCOOP powder Take 17 g by mouth every morning. Mix in 8 oz water and drink    [provider]  Rotigotine (NEUPRO) 8 MG/24HR PT24 Place 1 patch onto the skin at bedtime. For Parkinson's    [provider]  Semaglutide,0.25 or 0.5MG /DOS, (OZEMPIC, 0.25 OR 0.5 MG/DOSE,) 2 MG/1.5ML SOPN Inject 0.5 mg into the skin every Tuesday.    [provider]  senna-docusate (SENOKOT-S) 8.6-50 MG tablet Take 1 tablet by mouth 2 (two) times daily as needed for moderate constipation. 09/25/20   Mercy Riding, MD  thiamine 100 MG tablet Take 1 tablet (100 mg total) by mouth daily. 09/26/20   Mercy Riding, MD  traMADol (ULTRAM) 50 MG tablet Take 1.5 tablets (75 mg total) by mouth 2 (two) times daily. 02/01/21   Barb Merino, MD    Allergies    Oxycodone, Iodinated diagnostic agents, Iodine, Linezolid, Methadone hcl, Promethazine, Isovue [iopamidol], Methadone, Morphine  sulfate, Omnipaque [iohexol], Quetiapine, Renografin [diatrizoate], Statins, Zolpidem, Atorvastatin, Carbidopa-levodopa, Chlorhexidine gluconate [chlorhexidine], Clindamycin, Colesevelam, Doxazosin, Lovastatin, and Rosuvastatin  Review of Systems   Review of Systems  Constitutional:  Negative for fever.  HENT:  Negative for ear pain and sore throat.   Eyes:  Negative for pain.  Respiratory:  Negative for cough.   Cardiovascular:  Negative for chest pain.  Gastrointestinal:  Negative for abdominal pain.  Genitourinary:  Negative for flank pain.  Musculoskeletal:  Negative for back pain.  Skin:  Negative for color change and rash.  Neurological:  Negative for syncope.  All other systems reviewed and are negative.  Physical Exam Updated Vital Signs BP (!) 163/62 (BP Location: Left Arm)    Pulse (!) 55    Temp 97.8 F (36.6 C) (Oral)    Resp 18    SpO2 100%   Physical Exam Constitutional:      Appearance: He is well-developed.  HENT:     Head: Normocephalic.     Nose: Nose normal.  Eyes:     Extraocular Movements: Extraocular movements intact.  Cardiovascular:     Rate and Rhythm: Normal rate.  Pulmonary:     Effort: Pulmonary effort is normal.  Musculoskeletal:     Comments: Pain with attempted range of motion of the left hip.  Otherwise neurovascular intact bilateral lower extremities.  No T or L-spine tenderness or step-offs noted.  Skin:    Coloration: Skin is not jaundiced.  Neurological:     General: No focal deficit present.     Mental Status: He is alert. Mental status is at baseline.    ED Results / Procedures / Treatments   Labs (all labs ordered are listed, but only abnormal results are displayed) Labs Reviewed  CBC WITH DIFFERENTIAL/PLATELET - Abnormal; Notable for the following components:      Result Value   Hemoglobin 11.8 (*)    MCHC 29.6 (*)    All other components within normal limits  COMPREHENSIVE METABOLIC PANEL - Abnormal; Notable for the following  components:   Calcium 8.2 (*)    Total Protein 5.1 (*)    Albumin 2.2 (*)    AST 14 (*)    Anion gap 4 (*)    All other components within normal limits  RESP PANEL BY RT-PCR (FLU A&B, COVID) ARPGX2  URINALYSIS, ROUTINE W REFLEX MICROSCOPIC  EKG None  Radiology DG Chest 1 View  Result Date: 03/24/2021 CLINICAL DATA:  Encounter for fall injury. EXAM: CHEST  1 VIEW COMPARISON:  Portable chest 03/14/2021. FINDINGS: Small to moderate interval increased layering right pleural effusion is noted with overlying atelectasis or consolidation. There is a small left pleural effusion with adjacent atelectatic bands. Remaining lungs are clear. There is cardiomegaly without findings of CHF. There is aortic tortuosity and calcification. Interval removal left PICC. IMPRESSION: 1. Small to moderate-sized right pleural effusion with overlying atelectasis or consolidation. 2. Small left pleural effusion with adjacent linear atelectasis. 3. Cardiomegaly. Electronically Signed   By: Telford Nab M.D.   On: 03/24/2021 03:11   CT Head Wo Contrast  Result Date: 03/24/2021 CLINICAL DATA:  Head trauma, moderate-severe.  Unwitnessed fall. EXAM: CT HEAD WITHOUT CONTRAST TECHNIQUE: Contiguous axial images were obtained from the base of the skull through the vertex without intravenous contrast. COMPARISON:  03/14/2021 FINDINGS: Brain: Normal anatomic configuration. Parenchymal volume loss is commensurate with the patient's age. Mild periventricular white matter changes are present likely reflecting the sequela of small vessel ischemia. Remote lacunar infarcts are noted within the basal ganglia bilaterally and right corona radiata. No abnormal intra or extra-axial mass lesion or fluid collection. No abnormal mass effect or midline shift. No evidence of acute intracranial hemorrhage or infarct. Ventricular size is normal. Cerebellum unremarkable. Vascular: No asymmetric hyperdense vasculature at the skull base. Skull:  Intact Sinuses/Orbits: Paranasal sinuses are clear. Orbits are unremarkable. Other: Mastoid air cells and middle ear cavities are clear. IMPRESSION: No acute intracranial injury. No calvarial fracture. Stable senescent change and remote infarcts. Electronically Signed   By: Fidela [place] M.D.   On: 03/24/2021 02:25   CT Cervical Spine Wo Contrast  Result Date: 03/24/2021 CLINICAL DATA:  Status post fall. EXAM: CT CERVICAL SPINE WITHOUT CONTRAST TECHNIQUE: Multidetector CT imaging of the cervical spine was performed without intravenous contrast. Multiplanar CT image reconstructions were also generated. COMPARISON:  March 14, 2021 FINDINGS: Alignment: Normal. Skull base and vertebrae: No acute fracture. A stable chronic deformity is seen above the anterior atlantoaxial articulation, adjacent to the anterior aspect of the tip of the dens. Chronic and degenerative changes are seen involving the body and tip of the dens. Soft tissues and spinal canal: No prevertebral fluid or swelling. No visible canal hematoma. Disc levels: Moderate to marked severity endplate sclerosis is seen at the levels of C3-C4, C4-C5, C5-C6 and C6-C7. There is moderate severity narrowing of the anterior atlantoaxial articulation. Moderate to marked severity intervertebral disc space narrowing is also seen at C3-C4, C4-C5, C5-C6 and C6-C7. Bilateral marked severity multilevel facet joint hypertrophy is noted, with fusion of the C2-C3 facet on the left. Upper chest: There is a large right-sided pleural effusion. A moderate size left pleural effusion is also noted. Other: None. IMPRESSION: 1. No acute fracture within the cervical spine. 2. Marked severity chronic and multilevel degenerative changes, as described above. 3. Large right-sided pleural effusion. 4. Moderate size left pleural effusion. Electronically Signed   By: Virgina Norfolk M.D.   On: 03/24/2021 02:16   DG Hip Unilat W or Wo Pelvis 2-3 Views Left  Result Date:  03/24/2021 CLINICAL DATA:  Status post fall. EXAM: DG HIP (WITH OR WITHOUT PELVIS) 2-3V LEFT COMPARISON:  December 29, 2020 FINDINGS: There is no evidence of an acute hip fracture or dislocation. A left hip replacement is seen without evidence of surrounding lucency to suggest the presence of hardware loosening or infection. This  represents a new finding when compared to the prior study. Degenerative changes are seen involving the visualized portion of the right hip, in the form of joint space narrowing and acetabular sclerosis. IMPRESSION: 1. No acute fracture or dislocation. 2. Left hip replacement without evidence of hardware loosening or infection. Electronically Signed   By: Virgina Norfolk M.D.   On: 03/24/2021 02:58    Procedures Procedures   Medications Ordered in ED Medications - No data to display  ED Course  I have reviewed the triage vital signs and the nursing notes.  Pertinent labs & imaging results that were available during my care of the patient were reviewed by me and considered in my medical decision making (see chart for details).    MDM Rules/Calculators/A&P                         CT imaging of the head and C-spine show no acute findings. X-rays of the pelvis show no acute findings as well.  Chest x-ray shows bilateral effusions.  However patient's O2 saturation is maintaining his baseline 3 L nasal cannula.  Family states that he was recently diagnosed with pneumonia and is receiving antibiotics.  They are in talks with hospice care and have decided to pursue hospice for the patient.  They will be meeting with her hospice team again tomorrow.    Final Clinical Impression(s) / ED Diagnoses Final diagnoses:  Fall, initial encounter  Hospice care    Rx / DC Orders ED Discharge Orders     None        Luna Fuse, MD 03/24/21 0400

## 2021-03-24 NOTE — ED Notes (Signed)
Patient returned from xray. States he wants to go home. Confused.

## 2021-03-24 NOTE — ED Notes (Signed)
Attempted to call Isaias Cowman [phone] , someone answered transferred my called and was disconnected.

## 2021-03-24 NOTE — ED Notes (Signed)
Report called to [place] at [facility] and rehab.

## 2021-03-24 NOTE — ED Notes (Signed)
Attempted to call report to [facility]. No answer

## 2021-03-26 ENCOUNTER — Other Ambulatory Visit: Payer: Self-pay | Admitting: Pharmacist

## 2021-03-26 ENCOUNTER — Telehealth: Payer: Self-pay | Admitting: Orthopaedic Surgery

## 2021-03-26 ENCOUNTER — Other Ambulatory Visit (HOSPITAL_COMMUNITY): Payer: Self-pay

## 2021-03-26 DIAGNOSIS — F419 Anxiety disorder, unspecified: Secondary | ICD-10-CM | POA: Diagnosis not present

## 2021-03-26 DIAGNOSIS — I129 Hypertensive chronic kidney disease with stage 1 through stage 4 chronic kidney disease, or unspecified chronic kidney disease: Secondary | ICD-10-CM | POA: Diagnosis not present

## 2021-03-26 DIAGNOSIS — E1142 Type 2 diabetes mellitus with diabetic polyneuropathy: Secondary | ICD-10-CM | POA: Diagnosis not present

## 2021-03-26 DIAGNOSIS — Z96642 Presence of left artificial hip joint: Secondary | ICD-10-CM | POA: Diagnosis not present

## 2021-03-26 DIAGNOSIS — E785 Hyperlipidemia, unspecified: Secondary | ICD-10-CM | POA: Diagnosis not present

## 2021-03-26 DIAGNOSIS — J9691 Respiratory failure, unspecified with hypoxia: Secondary | ICD-10-CM | POA: Diagnosis not present

## 2021-03-26 DIAGNOSIS — I2510 Atherosclerotic heart disease of native coronary artery without angina pectoris: Secondary | ICD-10-CM | POA: Diagnosis not present

## 2021-03-26 DIAGNOSIS — L308 Other specified dermatitis: Secondary | ICD-10-CM | POA: Diagnosis not present

## 2021-03-26 DIAGNOSIS — R634 Abnormal weight loss: Secondary | ICD-10-CM | POA: Diagnosis not present

## 2021-03-26 DIAGNOSIS — I714 Abdominal aortic aneurysm, without rupture, unspecified: Secondary | ICD-10-CM | POA: Diagnosis not present

## 2021-03-26 DIAGNOSIS — R1310 Dysphagia, unspecified: Secondary | ICD-10-CM | POA: Diagnosis not present

## 2021-03-26 DIAGNOSIS — I4891 Unspecified atrial fibrillation: Secondary | ICD-10-CM | POA: Diagnosis not present

## 2021-03-26 DIAGNOSIS — T8452XD Infection and inflammatory reaction due to internal left hip prosthesis, subsequent encounter: Secondary | ICD-10-CM

## 2021-03-26 DIAGNOSIS — G2581 Restless legs syndrome: Secondary | ICD-10-CM | POA: Diagnosis not present

## 2021-03-26 DIAGNOSIS — S72002S Fracture of unspecified part of neck of left femur, sequela: Secondary | ICD-10-CM | POA: Diagnosis not present

## 2021-03-26 DIAGNOSIS — E1122 Type 2 diabetes mellitus with diabetic chronic kidney disease: Secondary | ICD-10-CM | POA: Diagnosis not present

## 2021-03-26 DIAGNOSIS — R296 Repeated falls: Secondary | ICD-10-CM | POA: Diagnosis not present

## 2021-03-26 DIAGNOSIS — E559 Vitamin D deficiency, unspecified: Secondary | ICD-10-CM | POA: Diagnosis not present

## 2021-03-26 DIAGNOSIS — M86152 Other acute osteomyelitis, left femur: Secondary | ICD-10-CM | POA: Diagnosis not present

## 2021-03-26 DIAGNOSIS — L89891 Pressure ulcer of other site, stage 1: Secondary | ICD-10-CM | POA: Diagnosis not present

## 2021-03-26 DIAGNOSIS — N400 Enlarged prostate without lower urinary tract symptoms: Secondary | ICD-10-CM | POA: Diagnosis not present

## 2021-03-26 DIAGNOSIS — D509 Iron deficiency anemia, unspecified: Secondary | ICD-10-CM | POA: Diagnosis not present

## 2021-03-26 DIAGNOSIS — E1169 Type 2 diabetes mellitus with other specified complication: Secondary | ICD-10-CM | POA: Diagnosis not present

## 2021-03-26 DIAGNOSIS — N1831 Chronic kidney disease, stage 3a: Secondary | ICD-10-CM | POA: Diagnosis not present

## 2021-03-26 DIAGNOSIS — G20 Parkinson's disease: Secondary | ICD-10-CM | POA: Diagnosis not present

## 2021-03-26 DIAGNOSIS — G4733 Obstructive sleep apnea (adult) (pediatric): Secondary | ICD-10-CM | POA: Diagnosis not present

## 2021-03-26 MED ORDER — OMADACYCLINE TOSYLATE 150 MG PO TABS: 300.0000 mg | ORAL_TABLET | Freq: Every day | ORAL | 0 refills | Status: AC

## 2021-03-26 MED ORDER — OMADACYCLINE TOSYLATE 150 MG PO TABS: 300.0000 mg | ORAL_TABLET | Freq: Every day | ORAL | 2 refills | Status: DC

## 2021-03-26 NOTE — Telephone Encounter (Signed)
Discussed plan with Colletta Maryland over the phone today. Plan for 6 weeks of oral omadacycline 300mg  once daily, and supply will be via samples that wife will have to pick up every 2 weeks. She requested faxed copy of orders. I entered no print omadacycline order on his profile. Cassie, would you mind printing prescription and/or notes for her? Fax # is [phone]. Thank you!

## 2021-03-26 NOTE — Telephone Encounter (Signed)
Rehab called and needs someone to call them. They have questions about wound vac. Pt is going into hospice.   CB [phone]

## 2021-03-26 NOTE — Telephone Encounter (Signed)
Faxed Nuzyra prescription to number above. Thanks!

## 2021-03-27 ENCOUNTER — Telehealth: Payer: Self-pay

## 2021-03-27 DIAGNOSIS — E1169 Type 2 diabetes mellitus with other specified complication: Secondary | ICD-10-CM | POA: Diagnosis not present

## 2021-03-27 DIAGNOSIS — I2510 Atherosclerotic heart disease of native coronary artery without angina pectoris: Secondary | ICD-10-CM | POA: Diagnosis not present

## 2021-03-27 DIAGNOSIS — G20 Parkinson's disease: Secondary | ICD-10-CM | POA: Diagnosis not present

## 2021-03-27 DIAGNOSIS — S72002S Fracture of unspecified part of neck of left femur, sequela: Secondary | ICD-10-CM | POA: Diagnosis not present

## 2021-03-27 DIAGNOSIS — Z96642 Presence of left artificial hip joint: Secondary | ICD-10-CM | POA: Diagnosis not present

## 2021-03-27 DIAGNOSIS — L97522 Non-pressure chronic ulcer of other part of left foot with fat layer exposed: Secondary | ICD-10-CM | POA: Diagnosis not present

## 2021-03-27 DIAGNOSIS — L24A9 Irritant contact dermatitis due friction or contact with other specified body fluids: Secondary | ICD-10-CM | POA: Diagnosis not present

## 2021-03-27 DIAGNOSIS — M86152 Other acute osteomyelitis, left femur: Secondary | ICD-10-CM | POA: Diagnosis not present

## 2021-03-27 NOTE — Telephone Encounter (Signed)
[facility] Patient Advocate Encounter  Received notification from Patient Assistance [facility] that patient has been denied enrollment into their program to receive 2 week supply of Nuzyra  from the drug manufacturer.      Enrollment form processed and was denied to patient exceeding income criteria. Patient may appeal by submitting income documentation to program if income is less than $65,500.00 for [facility] of 2. Contact at [phone].  Prescription was filled at [facility] for 2 weeks for $600.00  Patient knows to call the office with questions or concerns.   [facility] will continue to follow.  Ileene Patrick, [facility] Patient [facility] for Infectious Disease Phone: [phone] Fax:  [phone]

## 2021-03-29 DIAGNOSIS — S72002S Fracture of unspecified part of neck of left femur, sequela: Secondary | ICD-10-CM | POA: Diagnosis not present

## 2021-03-29 DIAGNOSIS — G20 Parkinson's disease: Secondary | ICD-10-CM | POA: Diagnosis not present

## 2021-03-29 DIAGNOSIS — Z96642 Presence of left artificial hip joint: Secondary | ICD-10-CM | POA: Diagnosis not present

## 2021-03-29 DIAGNOSIS — M86152 Other acute osteomyelitis, left femur: Secondary | ICD-10-CM | POA: Diagnosis not present

## 2021-03-29 DIAGNOSIS — I2510 Atherosclerotic heart disease of native coronary artery without angina pectoris: Secondary | ICD-10-CM | POA: Diagnosis not present

## 2021-03-29 DIAGNOSIS — E1169 Type 2 diabetes mellitus with other specified complication: Secondary | ICD-10-CM | POA: Diagnosis not present

## 2021-03-31 DIAGNOSIS — Z96642 Presence of left artificial hip joint: Secondary | ICD-10-CM | POA: Diagnosis not present

## 2021-03-31 DIAGNOSIS — G20 Parkinson's disease: Secondary | ICD-10-CM | POA: Diagnosis not present

## 2021-03-31 DIAGNOSIS — I2510 Atherosclerotic heart disease of native coronary artery without angina pectoris: Secondary | ICD-10-CM | POA: Diagnosis not present

## 2021-03-31 DIAGNOSIS — E1169 Type 2 diabetes mellitus with other specified complication: Secondary | ICD-10-CM | POA: Diagnosis not present

## 2021-03-31 DIAGNOSIS — M86152 Other acute osteomyelitis, left femur: Secondary | ICD-10-CM | POA: Diagnosis not present

## 2021-03-31 DIAGNOSIS — S72002S Fracture of unspecified part of neck of left femur, sequela: Secondary | ICD-10-CM | POA: Diagnosis not present

## 2021-04-02 DIAGNOSIS — M86152 Other acute osteomyelitis, left femur: Secondary | ICD-10-CM | POA: Diagnosis not present

## 2021-04-02 DIAGNOSIS — E1169 Type 2 diabetes mellitus with other specified complication: Secondary | ICD-10-CM | POA: Diagnosis not present

## 2021-04-02 DIAGNOSIS — Z96642 Presence of left artificial hip joint: Secondary | ICD-10-CM | POA: Diagnosis not present

## 2021-04-02 DIAGNOSIS — I2510 Atherosclerotic heart disease of native coronary artery without angina pectoris: Secondary | ICD-10-CM | POA: Diagnosis not present

## 2021-04-02 DIAGNOSIS — G20 Parkinson's disease: Secondary | ICD-10-CM | POA: Diagnosis not present

## 2021-04-02 DIAGNOSIS — S72002S Fracture of unspecified part of neck of left femur, sequela: Secondary | ICD-10-CM | POA: Diagnosis not present

## 2021-04-04 DIAGNOSIS — R296 Repeated falls: Secondary | ICD-10-CM | POA: Diagnosis not present

## 2021-04-04 DIAGNOSIS — I480 Paroxysmal atrial fibrillation: Secondary | ICD-10-CM | POA: Diagnosis not present

## 2021-04-04 DIAGNOSIS — E1122 Type 2 diabetes mellitus with diabetic chronic kidney disease: Secondary | ICD-10-CM | POA: Diagnosis not present

## 2021-04-04 DIAGNOSIS — A0472 Enterocolitis due to Clostridium difficile, not specified as recurrent: Secondary | ICD-10-CM | POA: Diagnosis not present

## 2021-04-04 DIAGNOSIS — T8142XA Infection following a procedure, deep incisional surgical site, initial encounter: Secondary | ICD-10-CM | POA: Diagnosis not present

## 2021-04-04 DIAGNOSIS — G20 Parkinson's disease: Secondary | ICD-10-CM | POA: Diagnosis not present

## 2021-04-04 DIAGNOSIS — M869 Osteomyelitis, unspecified: Secondary | ICD-10-CM | POA: Diagnosis not present

## 2021-04-05 DIAGNOSIS — L89323 Pressure ulcer of left buttock, stage 3: Secondary | ICD-10-CM | POA: Diagnosis not present

## 2021-04-05 DIAGNOSIS — M86152 Other acute osteomyelitis, left femur: Secondary | ICD-10-CM | POA: Diagnosis not present

## 2021-04-05 DIAGNOSIS — E1169 Type 2 diabetes mellitus with other specified complication: Secondary | ICD-10-CM | POA: Diagnosis not present

## 2021-04-05 DIAGNOSIS — L97522 Non-pressure chronic ulcer of other part of left foot with fat layer exposed: Secondary | ICD-10-CM | POA: Diagnosis not present

## 2021-04-05 DIAGNOSIS — Z96642 Presence of left artificial hip joint: Secondary | ICD-10-CM | POA: Diagnosis not present

## 2021-04-05 DIAGNOSIS — G20 Parkinson's disease: Secondary | ICD-10-CM | POA: Diagnosis not present

## 2021-04-05 DIAGNOSIS — I2510 Atherosclerotic heart disease of native coronary artery without angina pectoris: Secondary | ICD-10-CM | POA: Diagnosis not present

## 2021-04-05 DIAGNOSIS — S72002S Fracture of unspecified part of neck of left femur, sequela: Secondary | ICD-10-CM | POA: Diagnosis not present

## 2021-04-06 DIAGNOSIS — E1169 Type 2 diabetes mellitus with other specified complication: Secondary | ICD-10-CM | POA: Diagnosis not present

## 2021-04-06 DIAGNOSIS — Z96642 Presence of left artificial hip joint: Secondary | ICD-10-CM | POA: Diagnosis not present

## 2021-04-06 DIAGNOSIS — M86152 Other acute osteomyelitis, left femur: Secondary | ICD-10-CM | POA: Diagnosis not present

## 2021-04-06 DIAGNOSIS — I2510 Atherosclerotic heart disease of native coronary artery without angina pectoris: Secondary | ICD-10-CM | POA: Diagnosis not present

## 2021-04-06 DIAGNOSIS — S72002S Fracture of unspecified part of neck of left femur, sequela: Secondary | ICD-10-CM | POA: Diagnosis not present

## 2021-04-06 DIAGNOSIS — G20 Parkinson's disease: Secondary | ICD-10-CM | POA: Diagnosis not present

## 2021-04-06 LAB — AEROBIC/ANAEROBIC CULTURE W GRAM STAIN (SURGICAL/DEEP WOUND)

## 2021-04-07 DIAGNOSIS — I2510 Atherosclerotic heart disease of native coronary artery without angina pectoris: Secondary | ICD-10-CM | POA: Diagnosis not present

## 2021-04-07 DIAGNOSIS — G20 Parkinson's disease: Secondary | ICD-10-CM | POA: Diagnosis not present

## 2021-04-07 DIAGNOSIS — E1169 Type 2 diabetes mellitus with other specified complication: Secondary | ICD-10-CM | POA: Diagnosis not present

## 2021-04-07 DIAGNOSIS — S72002S Fracture of unspecified part of neck of left femur, sequela: Secondary | ICD-10-CM | POA: Diagnosis not present

## 2021-04-07 DIAGNOSIS — Z96642 Presence of left artificial hip joint: Secondary | ICD-10-CM | POA: Diagnosis not present

## 2021-04-07 DIAGNOSIS — M86152 Other acute osteomyelitis, left femur: Secondary | ICD-10-CM | POA: Diagnosis not present

## 2021-04-08 DIAGNOSIS — E1169 Type 2 diabetes mellitus with other specified complication: Secondary | ICD-10-CM | POA: Diagnosis not present

## 2021-04-08 DIAGNOSIS — Z96642 Presence of left artificial hip joint: Secondary | ICD-10-CM | POA: Diagnosis not present

## 2021-04-08 DIAGNOSIS — E1142 Type 2 diabetes mellitus with diabetic polyneuropathy: Secondary | ICD-10-CM | POA: Diagnosis not present

## 2021-04-08 DIAGNOSIS — M86152 Other acute osteomyelitis, left femur: Secondary | ICD-10-CM | POA: Diagnosis not present

## 2021-04-08 DIAGNOSIS — E785 Hyperlipidemia, unspecified: Secondary | ICD-10-CM | POA: Diagnosis not present

## 2021-04-08 DIAGNOSIS — G20 Parkinson's disease: Secondary | ICD-10-CM | POA: Diagnosis not present

## 2021-04-08 DIAGNOSIS — D509 Iron deficiency anemia, unspecified: Secondary | ICD-10-CM | POA: Diagnosis not present

## 2021-04-08 DIAGNOSIS — L89891 Pressure ulcer of other site, stage 1: Secondary | ICD-10-CM | POA: Diagnosis not present

## 2021-04-08 DIAGNOSIS — L308 Other specified dermatitis: Secondary | ICD-10-CM | POA: Diagnosis not present

## 2021-04-08 DIAGNOSIS — I129 Hypertensive chronic kidney disease with stage 1 through stage 4 chronic kidney disease, or unspecified chronic kidney disease: Secondary | ICD-10-CM | POA: Diagnosis not present

## 2021-04-08 DIAGNOSIS — M25552 Pain in left hip: Secondary | ICD-10-CM | POA: Diagnosis not present

## 2021-04-08 DIAGNOSIS — G2581 Restless legs syndrome: Secondary | ICD-10-CM | POA: Diagnosis not present

## 2021-04-08 DIAGNOSIS — S72002S Fracture of unspecified part of neck of left femur, sequela: Secondary | ICD-10-CM | POA: Diagnosis not present

## 2021-04-08 DIAGNOSIS — J9691 Respiratory failure, unspecified with hypoxia: Secondary | ICD-10-CM | POA: Diagnosis not present

## 2021-04-08 DIAGNOSIS — I2510 Atherosclerotic heart disease of native coronary artery without angina pectoris: Secondary | ICD-10-CM | POA: Diagnosis not present

## 2021-04-08 DIAGNOSIS — R634 Abnormal weight loss: Secondary | ICD-10-CM | POA: Diagnosis not present

## 2021-04-08 DIAGNOSIS — F419 Anxiety disorder, unspecified: Secondary | ICD-10-CM | POA: Diagnosis not present

## 2021-04-08 DIAGNOSIS — R1310 Dysphagia, unspecified: Secondary | ICD-10-CM | POA: Diagnosis not present

## 2021-04-08 DIAGNOSIS — I714 Abdominal aortic aneurysm, without rupture, unspecified: Secondary | ICD-10-CM | POA: Diagnosis not present

## 2021-04-08 DIAGNOSIS — N1831 Chronic kidney disease, stage 3a: Secondary | ICD-10-CM | POA: Diagnosis not present

## 2021-04-08 DIAGNOSIS — E559 Vitamin D deficiency, unspecified: Secondary | ICD-10-CM | POA: Diagnosis not present

## 2021-04-08 DIAGNOSIS — I4891 Unspecified atrial fibrillation: Secondary | ICD-10-CM | POA: Diagnosis not present

## 2021-04-08 DIAGNOSIS — R296 Repeated falls: Secondary | ICD-10-CM | POA: Diagnosis not present

## 2021-04-08 DIAGNOSIS — G4733 Obstructive sleep apnea (adult) (pediatric): Secondary | ICD-10-CM | POA: Diagnosis not present

## 2021-04-08 DIAGNOSIS — E1122 Type 2 diabetes mellitus with diabetic chronic kidney disease: Secondary | ICD-10-CM | POA: Diagnosis not present

## 2021-04-08 DIAGNOSIS — N400 Enlarged prostate without lower urinary tract symptoms: Secondary | ICD-10-CM | POA: Diagnosis not present

## 2021-04-09 DIAGNOSIS — M86152 Other acute osteomyelitis, left femur: Secondary | ICD-10-CM | POA: Diagnosis not present

## 2021-04-09 DIAGNOSIS — S72002S Fracture of unspecified part of neck of left femur, sequela: Secondary | ICD-10-CM | POA: Diagnosis not present

## 2021-04-09 DIAGNOSIS — R296 Repeated falls: Secondary | ICD-10-CM | POA: Diagnosis not present

## 2021-04-09 DIAGNOSIS — Z96642 Presence of left artificial hip joint: Secondary | ICD-10-CM | POA: Diagnosis not present

## 2021-04-09 DIAGNOSIS — E1169 Type 2 diabetes mellitus with other specified complication: Secondary | ICD-10-CM | POA: Diagnosis not present

## 2021-04-09 DIAGNOSIS — G20 Parkinson's disease: Secondary | ICD-10-CM | POA: Diagnosis not present

## 2021-04-09 DIAGNOSIS — I2510 Atherosclerotic heart disease of native coronary artery without angina pectoris: Secondary | ICD-10-CM | POA: Diagnosis not present

## 2021-04-12 DIAGNOSIS — L89892 Pressure ulcer of other site, stage 2: Secondary | ICD-10-CM | POA: Diagnosis not present

## 2021-04-12 DIAGNOSIS — F411 Generalized anxiety disorder: Secondary | ICD-10-CM | POA: Diagnosis not present

## 2021-04-12 DIAGNOSIS — G20 Parkinson's disease: Secondary | ICD-10-CM | POA: Diagnosis not present

## 2021-04-12 DIAGNOSIS — F4323 Adjustment disorder with mixed anxiety and depressed mood: Secondary | ICD-10-CM | POA: Diagnosis not present

## 2021-04-12 DIAGNOSIS — F5101 Primary insomnia: Secondary | ICD-10-CM | POA: Diagnosis not present

## 2021-04-12 DIAGNOSIS — L97522 Non-pressure chronic ulcer of other part of left foot with fat layer exposed: Secondary | ICD-10-CM | POA: Diagnosis not present

## 2021-04-12 DIAGNOSIS — L89323 Pressure ulcer of left buttock, stage 3: Secondary | ICD-10-CM | POA: Diagnosis not present

## 2021-04-13 ENCOUNTER — Encounter (HOSPITAL_COMMUNITY): Payer: Self-pay | Admitting: Radiology

## 2021-04-13 DIAGNOSIS — I2510 Atherosclerotic heart disease of native coronary artery without angina pectoris: Secondary | ICD-10-CM | POA: Diagnosis not present

## 2021-04-13 DIAGNOSIS — Z96642 Presence of left artificial hip joint: Secondary | ICD-10-CM | POA: Diagnosis not present

## 2021-04-13 DIAGNOSIS — G20 Parkinson's disease: Secondary | ICD-10-CM | POA: Diagnosis not present

## 2021-04-13 DIAGNOSIS — E1169 Type 2 diabetes mellitus with other specified complication: Secondary | ICD-10-CM | POA: Diagnosis not present

## 2021-04-13 DIAGNOSIS — M86152 Other acute osteomyelitis, left femur: Secondary | ICD-10-CM | POA: Diagnosis not present

## 2021-04-13 DIAGNOSIS — S72002S Fracture of unspecified part of neck of left femur, sequela: Secondary | ICD-10-CM | POA: Diagnosis not present

## 2021-04-16 DIAGNOSIS — F4323 Adjustment disorder with mixed anxiety and depressed mood: Secondary | ICD-10-CM | POA: Diagnosis not present

## 2021-04-16 DIAGNOSIS — I2510 Atherosclerotic heart disease of native coronary artery without angina pectoris: Secondary | ICD-10-CM | POA: Diagnosis not present

## 2021-04-16 DIAGNOSIS — G20 Parkinson's disease: Secondary | ICD-10-CM | POA: Diagnosis not present

## 2021-04-16 DIAGNOSIS — Z96642 Presence of left artificial hip joint: Secondary | ICD-10-CM | POA: Diagnosis not present

## 2021-04-16 DIAGNOSIS — R296 Repeated falls: Secondary | ICD-10-CM | POA: Diagnosis not present

## 2021-04-16 DIAGNOSIS — M86152 Other acute osteomyelitis, left femur: Secondary | ICD-10-CM | POA: Diagnosis not present

## 2021-04-16 DIAGNOSIS — E1169 Type 2 diabetes mellitus with other specified complication: Secondary | ICD-10-CM | POA: Diagnosis not present

## 2021-04-16 DIAGNOSIS — F5101 Primary insomnia: Secondary | ICD-10-CM | POA: Diagnosis not present

## 2021-04-16 DIAGNOSIS — S72002S Fracture of unspecified part of neck of left femur, sequela: Secondary | ICD-10-CM | POA: Diagnosis not present

## 2021-04-17 DIAGNOSIS — Z96642 Presence of left artificial hip joint: Secondary | ICD-10-CM | POA: Diagnosis not present

## 2021-04-17 DIAGNOSIS — M86152 Other acute osteomyelitis, left femur: Secondary | ICD-10-CM | POA: Diagnosis not present

## 2021-04-17 DIAGNOSIS — I2510 Atherosclerotic heart disease of native coronary artery without angina pectoris: Secondary | ICD-10-CM | POA: Diagnosis not present

## 2021-04-17 DIAGNOSIS — E1169 Type 2 diabetes mellitus with other specified complication: Secondary | ICD-10-CM | POA: Diagnosis not present

## 2021-04-17 DIAGNOSIS — G20 Parkinson's disease: Secondary | ICD-10-CM | POA: Diagnosis not present

## 2021-04-17 DIAGNOSIS — S72002S Fracture of unspecified part of neck of left femur, sequela: Secondary | ICD-10-CM | POA: Diagnosis not present

## 2021-04-18 DIAGNOSIS — R296 Repeated falls: Secondary | ICD-10-CM | POA: Diagnosis not present

## 2021-04-19 DIAGNOSIS — Z96642 Presence of left artificial hip joint: Secondary | ICD-10-CM | POA: Diagnosis not present

## 2021-04-19 DIAGNOSIS — I2510 Atherosclerotic heart disease of native coronary artery without angina pectoris: Secondary | ICD-10-CM | POA: Diagnosis not present

## 2021-04-19 DIAGNOSIS — L97522 Non-pressure chronic ulcer of other part of left foot with fat layer exposed: Secondary | ICD-10-CM | POA: Diagnosis not present

## 2021-04-19 DIAGNOSIS — E1169 Type 2 diabetes mellitus with other specified complication: Secondary | ICD-10-CM | POA: Diagnosis not present

## 2021-04-19 DIAGNOSIS — L89323 Pressure ulcer of left buttock, stage 3: Secondary | ICD-10-CM | POA: Diagnosis not present

## 2021-04-19 DIAGNOSIS — M86152 Other acute osteomyelitis, left femur: Secondary | ICD-10-CM | POA: Diagnosis not present

## 2021-04-19 DIAGNOSIS — M869 Osteomyelitis, unspecified: Secondary | ICD-10-CM | POA: Diagnosis not present

## 2021-04-19 DIAGNOSIS — S72002S Fracture of unspecified part of neck of left femur, sequela: Secondary | ICD-10-CM | POA: Diagnosis not present

## 2021-04-19 DIAGNOSIS — I480 Paroxysmal atrial fibrillation: Secondary | ICD-10-CM | POA: Diagnosis not present

## 2021-04-19 DIAGNOSIS — G20 Parkinson's disease: Secondary | ICD-10-CM | POA: Diagnosis not present

## 2021-04-19 DIAGNOSIS — L89893 Pressure ulcer of other site, stage 3: Secondary | ICD-10-CM | POA: Diagnosis not present

## 2021-04-20 DIAGNOSIS — S72002S Fracture of unspecified part of neck of left femur, sequela: Secondary | ICD-10-CM | POA: Diagnosis not present

## 2021-04-20 DIAGNOSIS — Z96642 Presence of left artificial hip joint: Secondary | ICD-10-CM | POA: Diagnosis not present

## 2021-04-20 DIAGNOSIS — M86152 Other acute osteomyelitis, left femur: Secondary | ICD-10-CM | POA: Diagnosis not present

## 2021-04-20 DIAGNOSIS — E1169 Type 2 diabetes mellitus with other specified complication: Secondary | ICD-10-CM | POA: Diagnosis not present

## 2021-04-20 DIAGNOSIS — G20 Parkinson's disease: Secondary | ICD-10-CM | POA: Diagnosis not present

## 2021-04-20 DIAGNOSIS — I2510 Atherosclerotic heart disease of native coronary artery without angina pectoris: Secondary | ICD-10-CM | POA: Diagnosis not present

## 2021-04-21 DIAGNOSIS — Z96642 Presence of left artificial hip joint: Secondary | ICD-10-CM | POA: Diagnosis not present

## 2021-04-21 DIAGNOSIS — I2510 Atherosclerotic heart disease of native coronary artery without angina pectoris: Secondary | ICD-10-CM | POA: Diagnosis not present

## 2021-04-21 DIAGNOSIS — S72002S Fracture of unspecified part of neck of left femur, sequela: Secondary | ICD-10-CM | POA: Diagnosis not present

## 2021-04-21 DIAGNOSIS — G20 Parkinson's disease: Secondary | ICD-10-CM | POA: Diagnosis not present

## 2021-04-21 DIAGNOSIS — M86152 Other acute osteomyelitis, left femur: Secondary | ICD-10-CM | POA: Diagnosis not present

## 2021-04-21 DIAGNOSIS — E1169 Type 2 diabetes mellitus with other specified complication: Secondary | ICD-10-CM | POA: Diagnosis not present

## 2021-04-23 DIAGNOSIS — I2510 Atherosclerotic heart disease of native coronary artery without angina pectoris: Secondary | ICD-10-CM | POA: Diagnosis not present

## 2021-04-23 DIAGNOSIS — G20 Parkinson's disease: Secondary | ICD-10-CM | POA: Diagnosis not present

## 2021-04-23 DIAGNOSIS — E1169 Type 2 diabetes mellitus with other specified complication: Secondary | ICD-10-CM | POA: Diagnosis not present

## 2021-04-23 DIAGNOSIS — S72002S Fracture of unspecified part of neck of left femur, sequela: Secondary | ICD-10-CM | POA: Diagnosis not present

## 2021-04-23 DIAGNOSIS — Z96642 Presence of left artificial hip joint: Secondary | ICD-10-CM | POA: Diagnosis not present

## 2021-04-23 DIAGNOSIS — M86152 Other acute osteomyelitis, left femur: Secondary | ICD-10-CM | POA: Diagnosis not present

## 2021-04-24 DIAGNOSIS — G20 Parkinson's disease: Secondary | ICD-10-CM | POA: Diagnosis not present

## 2021-04-24 DIAGNOSIS — E1169 Type 2 diabetes mellitus with other specified complication: Secondary | ICD-10-CM | POA: Diagnosis not present

## 2021-04-24 DIAGNOSIS — Z96642 Presence of left artificial hip joint: Secondary | ICD-10-CM | POA: Diagnosis not present

## 2021-04-24 DIAGNOSIS — M86152 Other acute osteomyelitis, left femur: Secondary | ICD-10-CM | POA: Diagnosis not present

## 2021-04-24 DIAGNOSIS — S72002S Fracture of unspecified part of neck of left femur, sequela: Secondary | ICD-10-CM | POA: Diagnosis not present

## 2021-04-24 DIAGNOSIS — I2510 Atherosclerotic heart disease of native coronary artery without angina pectoris: Secondary | ICD-10-CM | POA: Diagnosis not present

## 2021-04-25 ENCOUNTER — Other Ambulatory Visit (HOSPITAL_COMMUNITY): Payer: Self-pay

## 2021-04-25 ENCOUNTER — Telehealth: Payer: Self-pay

## 2021-04-25 NOTE — Telephone Encounter (Signed)
Dr. Juleen China - see above note from [facility]. Will wait until you take a look to see what next steps are.

## 2021-04-25 NOTE — Telephone Encounter (Signed)
LVM with [facility] requesting callback to discuss doxycycline orders. Thanks!

## 2021-04-25 NOTE — Telephone Encounter (Signed)
Jason Hartman reached out to our office requesting a refill for patient's Nuzyra. Patient missed follow up appointment on 03/21/21 with our office and also I see a denial in his chart regarding a denial for the [facility] patient assistance program. Patient will take his last dose today and is still at [facility]. Contact wife Jason Hartman with any changes as well. Please advise on refilling the medication. Brooke Steinhilber T Brooks Sailors

## 2021-04-25 NOTE — Telephone Encounter (Signed)
Patient was denied for patient assistance through [place] , but his insurance will pay for the medication for him he will have a high copay $3686.06 for 60 tabs for 30 days.

## 2021-04-26 DIAGNOSIS — L89323 Pressure ulcer of left buttock, stage 3: Secondary | ICD-10-CM | POA: Diagnosis not present

## 2021-04-26 DIAGNOSIS — F324 Major depressive disorder, single episode, in partial remission: Secondary | ICD-10-CM | POA: Diagnosis not present

## 2021-04-26 DIAGNOSIS — E1169 Type 2 diabetes mellitus with other specified complication: Secondary | ICD-10-CM | POA: Diagnosis not present

## 2021-04-26 DIAGNOSIS — Z96642 Presence of left artificial hip joint: Secondary | ICD-10-CM | POA: Diagnosis not present

## 2021-04-26 DIAGNOSIS — F5101 Primary insomnia: Secondary | ICD-10-CM | POA: Diagnosis not present

## 2021-04-26 DIAGNOSIS — I2510 Atherosclerotic heart disease of native coronary artery without angina pectoris: Secondary | ICD-10-CM | POA: Diagnosis not present

## 2021-04-26 DIAGNOSIS — M86152 Other acute osteomyelitis, left femur: Secondary | ICD-10-CM | POA: Diagnosis not present

## 2021-04-26 DIAGNOSIS — L97522 Non-pressure chronic ulcer of other part of left foot with fat layer exposed: Secondary | ICD-10-CM | POA: Diagnosis not present

## 2021-04-26 DIAGNOSIS — S72002S Fracture of unspecified part of neck of left femur, sequela: Secondary | ICD-10-CM | POA: Diagnosis not present

## 2021-04-26 DIAGNOSIS — F4323 Adjustment disorder with mixed anxiety and depressed mood: Secondary | ICD-10-CM | POA: Diagnosis not present

## 2021-04-26 DIAGNOSIS — L89890 Pressure ulcer of other site, unstageable: Secondary | ICD-10-CM | POA: Diagnosis not present

## 2021-04-26 DIAGNOSIS — F411 Generalized anxiety disorder: Secondary | ICD-10-CM | POA: Diagnosis not present

## 2021-04-26 DIAGNOSIS — G9341 Metabolic encephalopathy: Secondary | ICD-10-CM | POA: Diagnosis not present

## 2021-04-26 DIAGNOSIS — G20 Parkinson's disease: Secondary | ICD-10-CM | POA: Diagnosis not present

## 2021-04-26 DIAGNOSIS — R4182 Altered mental status, unspecified: Secondary | ICD-10-CM | POA: Diagnosis not present

## 2021-04-26 DIAGNOSIS — L89896 Pressure-induced deep tissue damage of other site: Secondary | ICD-10-CM | POA: Diagnosis not present

## 2021-04-26 NOTE — Telephone Encounter (Signed)
Spoke to [employer] at [employer] and relayed verbal order to stop [place] and start doxycycline 100mg  BID indefinitely for suppression at this time per Dr. Juleen China. Relayed information to patient's wife as well so she would be up-to-date. Estill Bamberg

## 2021-04-27 DIAGNOSIS — R0989 Other specified symptoms and signs involving the circulatory and respiratory systems: Secondary | ICD-10-CM | POA: Diagnosis not present

## 2021-04-27 DIAGNOSIS — R059 Cough, unspecified: Secondary | ICD-10-CM | POA: Diagnosis not present

## 2021-04-30 DIAGNOSIS — E1169 Type 2 diabetes mellitus with other specified complication: Secondary | ICD-10-CM | POA: Diagnosis not present

## 2021-04-30 DIAGNOSIS — I2510 Atherosclerotic heart disease of native coronary artery without angina pectoris: Secondary | ICD-10-CM | POA: Diagnosis not present

## 2021-04-30 DIAGNOSIS — Z96642 Presence of left artificial hip joint: Secondary | ICD-10-CM | POA: Diagnosis not present

## 2021-04-30 DIAGNOSIS — F5101 Primary insomnia: Secondary | ICD-10-CM | POA: Diagnosis not present

## 2021-04-30 DIAGNOSIS — S72002S Fracture of unspecified part of neck of left femur, sequela: Secondary | ICD-10-CM | POA: Diagnosis not present

## 2021-04-30 DIAGNOSIS — G20 Parkinson's disease: Secondary | ICD-10-CM | POA: Diagnosis not present

## 2021-04-30 DIAGNOSIS — F4323 Adjustment disorder with mixed anxiety and depressed mood: Secondary | ICD-10-CM | POA: Diagnosis not present

## 2021-04-30 DIAGNOSIS — M86152 Other acute osteomyelitis, left femur: Secondary | ICD-10-CM | POA: Diagnosis not present

## 2021-05-01 DIAGNOSIS — R0989 Other specified symptoms and signs involving the circulatory and respiratory systems: Secondary | ICD-10-CM | POA: Diagnosis not present

## 2021-05-01 DIAGNOSIS — I480 Paroxysmal atrial fibrillation: Secondary | ICD-10-CM | POA: Diagnosis not present

## 2021-05-01 DIAGNOSIS — E1122 Type 2 diabetes mellitus with diabetic chronic kidney disease: Secondary | ICD-10-CM | POA: Diagnosis not present

## 2021-05-01 DIAGNOSIS — R0602 Shortness of breath: Secondary | ICD-10-CM | POA: Diagnosis not present

## 2021-05-01 DIAGNOSIS — R296 Repeated falls: Secondary | ICD-10-CM | POA: Diagnosis not present

## 2021-05-01 DIAGNOSIS — J9811 Atelectasis: Secondary | ICD-10-CM | POA: Diagnosis not present

## 2021-05-01 DIAGNOSIS — G20 Parkinson's disease: Secondary | ICD-10-CM | POA: Diagnosis not present

## 2021-05-01 DIAGNOSIS — A0472 Enterocolitis due to Clostridium difficile, not specified as recurrent: Secondary | ICD-10-CM | POA: Diagnosis not present

## 2021-05-02 DIAGNOSIS — J208 Acute bronchitis due to other specified organisms: Secondary | ICD-10-CM | POA: Diagnosis not present

## 2021-05-02 DIAGNOSIS — R296 Repeated falls: Secondary | ICD-10-CM | POA: Diagnosis not present

## 2021-05-02 DIAGNOSIS — S72002S Fracture of unspecified part of neck of left femur, sequela: Secondary | ICD-10-CM | POA: Diagnosis not present

## 2021-05-02 DIAGNOSIS — I2510 Atherosclerotic heart disease of native coronary artery without angina pectoris: Secondary | ICD-10-CM | POA: Diagnosis not present

## 2021-05-02 DIAGNOSIS — M86152 Other acute osteomyelitis, left femur: Secondary | ICD-10-CM | POA: Diagnosis not present

## 2021-05-02 DIAGNOSIS — E1169 Type 2 diabetes mellitus with other specified complication: Secondary | ICD-10-CM | POA: Diagnosis not present

## 2021-05-02 DIAGNOSIS — Z96642 Presence of left artificial hip joint: Secondary | ICD-10-CM | POA: Diagnosis not present

## 2021-05-02 DIAGNOSIS — G20 Parkinson's disease: Secondary | ICD-10-CM | POA: Diagnosis not present

## 2021-05-03 DIAGNOSIS — L89896 Pressure-induced deep tissue damage of other site: Secondary | ICD-10-CM | POA: Diagnosis not present

## 2021-05-03 DIAGNOSIS — L97522 Non-pressure chronic ulcer of other part of left foot with fat layer exposed: Secondary | ICD-10-CM | POA: Diagnosis not present

## 2021-05-03 DIAGNOSIS — I110 Hypertensive heart disease with heart failure: Secondary | ICD-10-CM | POA: Diagnosis not present

## 2021-05-03 DIAGNOSIS — L89890 Pressure ulcer of other site, unstageable: Secondary | ICD-10-CM | POA: Diagnosis not present

## 2021-05-04 DIAGNOSIS — Z96642 Presence of left artificial hip joint: Secondary | ICD-10-CM | POA: Diagnosis not present

## 2021-05-04 DIAGNOSIS — M86152 Other acute osteomyelitis, left femur: Secondary | ICD-10-CM | POA: Diagnosis not present

## 2021-05-04 DIAGNOSIS — G20 Parkinson's disease: Secondary | ICD-10-CM | POA: Diagnosis not present

## 2021-05-04 DIAGNOSIS — I2510 Atherosclerotic heart disease of native coronary artery without angina pectoris: Secondary | ICD-10-CM | POA: Diagnosis not present

## 2021-05-04 DIAGNOSIS — S72002S Fracture of unspecified part of neck of left femur, sequela: Secondary | ICD-10-CM | POA: Diagnosis not present

## 2021-05-04 DIAGNOSIS — E1169 Type 2 diabetes mellitus with other specified complication: Secondary | ICD-10-CM | POA: Diagnosis not present

## 2021-05-05 DIAGNOSIS — Z96642 Presence of left artificial hip joint: Secondary | ICD-10-CM | POA: Diagnosis not present

## 2021-05-05 DIAGNOSIS — S72002S Fracture of unspecified part of neck of left femur, sequela: Secondary | ICD-10-CM | POA: Diagnosis not present

## 2021-05-05 DIAGNOSIS — G20 Parkinson's disease: Secondary | ICD-10-CM | POA: Diagnosis not present

## 2021-05-05 DIAGNOSIS — M86152 Other acute osteomyelitis, left femur: Secondary | ICD-10-CM | POA: Diagnosis not present

## 2021-05-05 DIAGNOSIS — I2510 Atherosclerotic heart disease of native coronary artery without angina pectoris: Secondary | ICD-10-CM | POA: Diagnosis not present

## 2021-05-05 DIAGNOSIS — E1169 Type 2 diabetes mellitus with other specified complication: Secondary | ICD-10-CM | POA: Diagnosis not present

## 2021-05-07 DIAGNOSIS — Z96642 Presence of left artificial hip joint: Secondary | ICD-10-CM | POA: Diagnosis not present

## 2021-05-07 DIAGNOSIS — G20 Parkinson's disease: Secondary | ICD-10-CM | POA: Diagnosis not present

## 2021-05-07 DIAGNOSIS — E1169 Type 2 diabetes mellitus with other specified complication: Secondary | ICD-10-CM | POA: Diagnosis not present

## 2021-05-07 DIAGNOSIS — S72002S Fracture of unspecified part of neck of left femur, sequela: Secondary | ICD-10-CM | POA: Diagnosis not present

## 2021-05-07 DIAGNOSIS — F5101 Primary insomnia: Secondary | ICD-10-CM | POA: Diagnosis not present

## 2021-05-07 DIAGNOSIS — M86152 Other acute osteomyelitis, left femur: Secondary | ICD-10-CM | POA: Diagnosis not present

## 2021-05-07 DIAGNOSIS — I2510 Atherosclerotic heart disease of native coronary artery without angina pectoris: Secondary | ICD-10-CM | POA: Diagnosis not present

## 2021-05-07 DIAGNOSIS — F4323 Adjustment disorder with mixed anxiety and depressed mood: Secondary | ICD-10-CM | POA: Diagnosis not present

## 2021-05-09 DIAGNOSIS — M86152 Other acute osteomyelitis, left femur: Secondary | ICD-10-CM | POA: Diagnosis not present

## 2021-05-09 DIAGNOSIS — G4733 Obstructive sleep apnea (adult) (pediatric): Secondary | ICD-10-CM | POA: Diagnosis not present

## 2021-05-09 DIAGNOSIS — L308 Other specified dermatitis: Secondary | ICD-10-CM | POA: Diagnosis not present

## 2021-05-09 DIAGNOSIS — Z96642 Presence of left artificial hip joint: Secondary | ICD-10-CM | POA: Diagnosis not present

## 2021-05-09 DIAGNOSIS — J9691 Respiratory failure, unspecified with hypoxia: Secondary | ICD-10-CM | POA: Diagnosis not present

## 2021-05-09 DIAGNOSIS — E1169 Type 2 diabetes mellitus with other specified complication: Secondary | ICD-10-CM | POA: Diagnosis not present

## 2021-05-09 DIAGNOSIS — S72002S Fracture of unspecified part of neck of left femur, sequela: Secondary | ICD-10-CM | POA: Diagnosis not present

## 2021-05-09 DIAGNOSIS — G20 Parkinson's disease: Secondary | ICD-10-CM | POA: Diagnosis not present

## 2021-05-09 DIAGNOSIS — R634 Abnormal weight loss: Secondary | ICD-10-CM | POA: Diagnosis not present

## 2021-05-09 DIAGNOSIS — E1122 Type 2 diabetes mellitus with diabetic chronic kidney disease: Secondary | ICD-10-CM | POA: Diagnosis not present

## 2021-05-09 DIAGNOSIS — L89891 Pressure ulcer of other site, stage 1: Secondary | ICD-10-CM | POA: Diagnosis not present

## 2021-05-09 DIAGNOSIS — G2581 Restless legs syndrome: Secondary | ICD-10-CM | POA: Diagnosis not present

## 2021-05-09 DIAGNOSIS — I714 Abdominal aortic aneurysm, without rupture, unspecified: Secondary | ICD-10-CM | POA: Diagnosis not present

## 2021-05-09 DIAGNOSIS — E559 Vitamin D deficiency, unspecified: Secondary | ICD-10-CM | POA: Diagnosis not present

## 2021-05-09 DIAGNOSIS — N1831 Chronic kidney disease, stage 3a: Secondary | ICD-10-CM | POA: Diagnosis not present

## 2021-05-09 DIAGNOSIS — D509 Iron deficiency anemia, unspecified: Secondary | ICD-10-CM | POA: Diagnosis not present

## 2021-05-09 DIAGNOSIS — E1142 Type 2 diabetes mellitus with diabetic polyneuropathy: Secondary | ICD-10-CM | POA: Diagnosis not present

## 2021-05-09 DIAGNOSIS — I129 Hypertensive chronic kidney disease with stage 1 through stage 4 chronic kidney disease, or unspecified chronic kidney disease: Secondary | ICD-10-CM | POA: Diagnosis not present

## 2021-05-09 DIAGNOSIS — R296 Repeated falls: Secondary | ICD-10-CM | POA: Diagnosis not present

## 2021-05-09 DIAGNOSIS — R1310 Dysphagia, unspecified: Secondary | ICD-10-CM | POA: Diagnosis not present

## 2021-05-09 DIAGNOSIS — F419 Anxiety disorder, unspecified: Secondary | ICD-10-CM | POA: Diagnosis not present

## 2021-05-09 DIAGNOSIS — I4891 Unspecified atrial fibrillation: Secondary | ICD-10-CM | POA: Diagnosis not present

## 2021-05-09 DIAGNOSIS — I2510 Atherosclerotic heart disease of native coronary artery without angina pectoris: Secondary | ICD-10-CM | POA: Diagnosis not present

## 2021-05-09 DIAGNOSIS — N400 Enlarged prostate without lower urinary tract symptoms: Secondary | ICD-10-CM | POA: Diagnosis not present

## 2021-05-09 DIAGNOSIS — E785 Hyperlipidemia, unspecified: Secondary | ICD-10-CM | POA: Diagnosis not present

## 2021-05-10 DIAGNOSIS — L89890 Pressure ulcer of other site, unstageable: Secondary | ICD-10-CM | POA: Diagnosis not present

## 2021-05-10 DIAGNOSIS — L89896 Pressure-induced deep tissue damage of other site: Secondary | ICD-10-CM | POA: Diagnosis not present

## 2021-05-10 DIAGNOSIS — L97522 Non-pressure chronic ulcer of other part of left foot with fat layer exposed: Secondary | ICD-10-CM | POA: Diagnosis not present

## 2021-05-11 DIAGNOSIS — Z96642 Presence of left artificial hip joint: Secondary | ICD-10-CM | POA: Diagnosis not present

## 2021-05-11 DIAGNOSIS — S72002S Fracture of unspecified part of neck of left femur, sequela: Secondary | ICD-10-CM | POA: Diagnosis not present

## 2021-05-11 DIAGNOSIS — I2510 Atherosclerotic heart disease of native coronary artery without angina pectoris: Secondary | ICD-10-CM | POA: Diagnosis not present

## 2021-05-11 DIAGNOSIS — R296 Repeated falls: Secondary | ICD-10-CM | POA: Diagnosis not present

## 2021-05-11 DIAGNOSIS — M86152 Other acute osteomyelitis, left femur: Secondary | ICD-10-CM | POA: Diagnosis not present

## 2021-05-11 DIAGNOSIS — G20 Parkinson's disease: Secondary | ICD-10-CM | POA: Diagnosis not present

## 2021-05-11 DIAGNOSIS — E1169 Type 2 diabetes mellitus with other specified complication: Secondary | ICD-10-CM | POA: Diagnosis not present

## 2021-05-14 DIAGNOSIS — S72002S Fracture of unspecified part of neck of left femur, sequela: Secondary | ICD-10-CM | POA: Diagnosis not present

## 2021-05-14 DIAGNOSIS — Z96642 Presence of left artificial hip joint: Secondary | ICD-10-CM | POA: Diagnosis not present

## 2021-05-14 DIAGNOSIS — M86152 Other acute osteomyelitis, left femur: Secondary | ICD-10-CM | POA: Diagnosis not present

## 2021-05-14 DIAGNOSIS — E1169 Type 2 diabetes mellitus with other specified complication: Secondary | ICD-10-CM | POA: Diagnosis not present

## 2021-05-14 DIAGNOSIS — G20 Parkinson's disease: Secondary | ICD-10-CM | POA: Diagnosis not present

## 2021-05-14 DIAGNOSIS — I2510 Atherosclerotic heart disease of native coronary artery without angina pectoris: Secondary | ICD-10-CM | POA: Diagnosis not present

## 2021-05-15 DIAGNOSIS — S72002S Fracture of unspecified part of neck of left femur, sequela: Secondary | ICD-10-CM | POA: Diagnosis not present

## 2021-05-15 DIAGNOSIS — M86152 Other acute osteomyelitis, left femur: Secondary | ICD-10-CM | POA: Diagnosis not present

## 2021-05-15 DIAGNOSIS — Z96642 Presence of left artificial hip joint: Secondary | ICD-10-CM | POA: Diagnosis not present

## 2021-05-15 DIAGNOSIS — I2510 Atherosclerotic heart disease of native coronary artery without angina pectoris: Secondary | ICD-10-CM | POA: Diagnosis not present

## 2021-05-15 DIAGNOSIS — G20 Parkinson's disease: Secondary | ICD-10-CM | POA: Diagnosis not present

## 2021-05-15 DIAGNOSIS — E1169 Type 2 diabetes mellitus with other specified complication: Secondary | ICD-10-CM | POA: Diagnosis not present

## 2021-05-17 DIAGNOSIS — L89890 Pressure ulcer of other site, unstageable: Secondary | ICD-10-CM | POA: Diagnosis not present

## 2021-05-17 DIAGNOSIS — E1169 Type 2 diabetes mellitus with other specified complication: Secondary | ICD-10-CM | POA: Diagnosis not present

## 2021-05-17 DIAGNOSIS — G20 Parkinson's disease: Secondary | ICD-10-CM | POA: Diagnosis not present

## 2021-05-17 DIAGNOSIS — L89896 Pressure-induced deep tissue damage of other site: Secondary | ICD-10-CM | POA: Diagnosis not present

## 2021-05-17 DIAGNOSIS — Z96642 Presence of left artificial hip joint: Secondary | ICD-10-CM | POA: Diagnosis not present

## 2021-05-17 DIAGNOSIS — T8142XA Infection following a procedure, deep incisional surgical site, initial encounter: Secondary | ICD-10-CM | POA: Diagnosis not present

## 2021-05-17 DIAGNOSIS — I2510 Atherosclerotic heart disease of native coronary artery without angina pectoris: Secondary | ICD-10-CM | POA: Diagnosis not present

## 2021-05-17 DIAGNOSIS — M86152 Other acute osteomyelitis, left femur: Secondary | ICD-10-CM | POA: Diagnosis not present

## 2021-05-17 DIAGNOSIS — M869 Osteomyelitis, unspecified: Secondary | ICD-10-CM | POA: Diagnosis not present

## 2021-05-17 DIAGNOSIS — I480 Paroxysmal atrial fibrillation: Secondary | ICD-10-CM | POA: Diagnosis not present

## 2021-05-17 DIAGNOSIS — S72002S Fracture of unspecified part of neck of left femur, sequela: Secondary | ICD-10-CM | POA: Diagnosis not present

## 2021-05-18 DIAGNOSIS — E1169 Type 2 diabetes mellitus with other specified complication: Secondary | ICD-10-CM | POA: Diagnosis not present

## 2021-05-18 DIAGNOSIS — I2510 Atherosclerotic heart disease of native coronary artery without angina pectoris: Secondary | ICD-10-CM | POA: Diagnosis not present

## 2021-05-18 DIAGNOSIS — M86152 Other acute osteomyelitis, left femur: Secondary | ICD-10-CM | POA: Diagnosis not present

## 2021-05-18 DIAGNOSIS — Z96642 Presence of left artificial hip joint: Secondary | ICD-10-CM | POA: Diagnosis not present

## 2021-05-18 DIAGNOSIS — S72002S Fracture of unspecified part of neck of left femur, sequela: Secondary | ICD-10-CM | POA: Diagnosis not present

## 2021-05-18 DIAGNOSIS — G20 Parkinson's disease: Secondary | ICD-10-CM | POA: Diagnosis not present

## 2021-05-21 DIAGNOSIS — M86152 Other acute osteomyelitis, left femur: Secondary | ICD-10-CM | POA: Diagnosis not present

## 2021-05-21 DIAGNOSIS — Z96642 Presence of left artificial hip joint: Secondary | ICD-10-CM | POA: Diagnosis not present

## 2021-05-21 DIAGNOSIS — S72002S Fracture of unspecified part of neck of left femur, sequela: Secondary | ICD-10-CM | POA: Diagnosis not present

## 2021-05-21 DIAGNOSIS — E1169 Type 2 diabetes mellitus with other specified complication: Secondary | ICD-10-CM | POA: Diagnosis not present

## 2021-05-21 DIAGNOSIS — G20 Parkinson's disease: Secondary | ICD-10-CM | POA: Diagnosis not present

## 2021-05-21 DIAGNOSIS — F411 Generalized anxiety disorder: Secondary | ICD-10-CM | POA: Diagnosis not present

## 2021-05-21 DIAGNOSIS — F5101 Primary insomnia: Secondary | ICD-10-CM | POA: Diagnosis not present

## 2021-05-21 DIAGNOSIS — I2510 Atherosclerotic heart disease of native coronary artery without angina pectoris: Secondary | ICD-10-CM | POA: Diagnosis not present

## 2021-05-22 DIAGNOSIS — G20 Parkinson's disease: Secondary | ICD-10-CM | POA: Diagnosis not present

## 2021-05-22 DIAGNOSIS — R278 Other lack of coordination: Secondary | ICD-10-CM | POA: Diagnosis not present

## 2021-05-22 DIAGNOSIS — M86152 Other acute osteomyelitis, left femur: Secondary | ICD-10-CM | POA: Diagnosis not present

## 2021-05-22 DIAGNOSIS — S72002S Fracture of unspecified part of neck of left femur, sequela: Secondary | ICD-10-CM | POA: Diagnosis not present

## 2021-05-22 DIAGNOSIS — M6281 Muscle weakness (generalized): Secondary | ICD-10-CM | POA: Diagnosis not present

## 2021-05-22 DIAGNOSIS — Z96642 Presence of left artificial hip joint: Secondary | ICD-10-CM | POA: Diagnosis not present

## 2021-05-22 DIAGNOSIS — Z9181 History of falling: Secondary | ICD-10-CM | POA: Diagnosis not present

## 2021-05-22 DIAGNOSIS — I2510 Atherosclerotic heart disease of native coronary artery without angina pectoris: Secondary | ICD-10-CM | POA: Diagnosis not present

## 2021-05-22 DIAGNOSIS — E1169 Type 2 diabetes mellitus with other specified complication: Secondary | ICD-10-CM | POA: Diagnosis not present

## 2021-05-23 DIAGNOSIS — M6281 Muscle weakness (generalized): Secondary | ICD-10-CM | POA: Diagnosis not present

## 2021-05-23 DIAGNOSIS — Z9181 History of falling: Secondary | ICD-10-CM | POA: Diagnosis not present

## 2021-05-23 DIAGNOSIS — R278 Other lack of coordination: Secondary | ICD-10-CM | POA: Diagnosis not present

## 2021-05-24 DIAGNOSIS — Z9181 History of falling: Secondary | ICD-10-CM | POA: Diagnosis not present

## 2021-05-24 DIAGNOSIS — L89890 Pressure ulcer of other site, unstageable: Secondary | ICD-10-CM | POA: Diagnosis not present

## 2021-05-24 DIAGNOSIS — M6281 Muscle weakness (generalized): Secondary | ICD-10-CM | POA: Diagnosis not present

## 2021-05-24 DIAGNOSIS — R278 Other lack of coordination: Secondary | ICD-10-CM | POA: Diagnosis not present

## 2021-05-24 DIAGNOSIS — L89896 Pressure-induced deep tissue damage of other site: Secondary | ICD-10-CM | POA: Diagnosis not present

## 2021-05-25 DIAGNOSIS — M6281 Muscle weakness (generalized): Secondary | ICD-10-CM | POA: Diagnosis not present

## 2021-05-25 DIAGNOSIS — S72002S Fracture of unspecified part of neck of left femur, sequela: Secondary | ICD-10-CM | POA: Diagnosis not present

## 2021-05-25 DIAGNOSIS — M86152 Other acute osteomyelitis, left femur: Secondary | ICD-10-CM | POA: Diagnosis not present

## 2021-05-25 DIAGNOSIS — Z9181 History of falling: Secondary | ICD-10-CM | POA: Diagnosis not present

## 2021-05-25 DIAGNOSIS — E1169 Type 2 diabetes mellitus with other specified complication: Secondary | ICD-10-CM | POA: Diagnosis not present

## 2021-05-25 DIAGNOSIS — I2510 Atherosclerotic heart disease of native coronary artery without angina pectoris: Secondary | ICD-10-CM | POA: Diagnosis not present

## 2021-05-25 DIAGNOSIS — R278 Other lack of coordination: Secondary | ICD-10-CM | POA: Diagnosis not present

## 2021-05-25 DIAGNOSIS — G20 Parkinson's disease: Secondary | ICD-10-CM | POA: Diagnosis not present

## 2021-05-25 DIAGNOSIS — Z96642 Presence of left artificial hip joint: Secondary | ICD-10-CM | POA: Diagnosis not present

## 2021-05-28 DIAGNOSIS — M86152 Other acute osteomyelitis, left femur: Secondary | ICD-10-CM | POA: Diagnosis not present

## 2021-05-28 DIAGNOSIS — I2510 Atherosclerotic heart disease of native coronary artery without angina pectoris: Secondary | ICD-10-CM | POA: Diagnosis not present

## 2021-05-28 DIAGNOSIS — S72002S Fracture of unspecified part of neck of left femur, sequela: Secondary | ICD-10-CM | POA: Diagnosis not present

## 2021-05-28 DIAGNOSIS — G20 Parkinson's disease: Secondary | ICD-10-CM | POA: Diagnosis not present

## 2021-05-28 DIAGNOSIS — M6281 Muscle weakness (generalized): Secondary | ICD-10-CM | POA: Diagnosis not present

## 2021-05-28 DIAGNOSIS — E1169 Type 2 diabetes mellitus with other specified complication: Secondary | ICD-10-CM | POA: Diagnosis not present

## 2021-05-28 DIAGNOSIS — Z96642 Presence of left artificial hip joint: Secondary | ICD-10-CM | POA: Diagnosis not present

## 2021-05-28 DIAGNOSIS — Z9181 History of falling: Secondary | ICD-10-CM | POA: Diagnosis not present

## 2021-05-28 DIAGNOSIS — R278 Other lack of coordination: Secondary | ICD-10-CM | POA: Diagnosis not present

## 2021-05-29 DIAGNOSIS — M6281 Muscle weakness (generalized): Secondary | ICD-10-CM | POA: Diagnosis not present

## 2021-05-29 DIAGNOSIS — R296 Repeated falls: Secondary | ICD-10-CM | POA: Diagnosis not present

## 2021-05-29 DIAGNOSIS — T8142XA Infection following a procedure, deep incisional surgical site, initial encounter: Secondary | ICD-10-CM | POA: Diagnosis not present

## 2021-05-29 DIAGNOSIS — G20 Parkinson's disease: Secondary | ICD-10-CM | POA: Diagnosis not present

## 2021-05-29 DIAGNOSIS — R278 Other lack of coordination: Secondary | ICD-10-CM | POA: Diagnosis not present

## 2021-05-29 DIAGNOSIS — E1122 Type 2 diabetes mellitus with diabetic chronic kidney disease: Secondary | ICD-10-CM | POA: Diagnosis not present

## 2021-05-29 DIAGNOSIS — I480 Paroxysmal atrial fibrillation: Secondary | ICD-10-CM | POA: Diagnosis not present

## 2021-05-29 DIAGNOSIS — Z9181 History of falling: Secondary | ICD-10-CM | POA: Diagnosis not present

## 2021-05-29 DIAGNOSIS — M869 Osteomyelitis, unspecified: Secondary | ICD-10-CM | POA: Diagnosis not present

## 2021-05-30 DIAGNOSIS — R278 Other lack of coordination: Secondary | ICD-10-CM | POA: Diagnosis not present

## 2021-05-30 DIAGNOSIS — M6281 Muscle weakness (generalized): Secondary | ICD-10-CM | POA: Diagnosis not present

## 2021-05-30 DIAGNOSIS — Z9181 History of falling: Secondary | ICD-10-CM | POA: Diagnosis not present

## 2021-05-31 DIAGNOSIS — R918 Other nonspecific abnormal finding of lung field: Secondary | ICD-10-CM | POA: Diagnosis not present

## 2021-05-31 DIAGNOSIS — R059 Cough, unspecified: Secondary | ICD-10-CM | POA: Diagnosis not present

## 2021-05-31 DIAGNOSIS — L89890 Pressure ulcer of other site, unstageable: Secondary | ICD-10-CM | POA: Diagnosis not present

## 2021-05-31 DIAGNOSIS — L89896 Pressure-induced deep tissue damage of other site: Secondary | ICD-10-CM | POA: Diagnosis not present

## 2021-06-01 DIAGNOSIS — G20 Parkinson's disease: Secondary | ICD-10-CM | POA: Diagnosis not present

## 2021-06-01 DIAGNOSIS — S72002S Fracture of unspecified part of neck of left femur, sequela: Secondary | ICD-10-CM | POA: Diagnosis not present

## 2021-06-01 DIAGNOSIS — M86152 Other acute osteomyelitis, left femur: Secondary | ICD-10-CM | POA: Diagnosis not present

## 2021-06-01 DIAGNOSIS — Z9181 History of falling: Secondary | ICD-10-CM | POA: Diagnosis not present

## 2021-06-01 DIAGNOSIS — Z96642 Presence of left artificial hip joint: Secondary | ICD-10-CM | POA: Diagnosis not present

## 2021-06-01 DIAGNOSIS — R278 Other lack of coordination: Secondary | ICD-10-CM | POA: Diagnosis not present

## 2021-06-01 DIAGNOSIS — M6281 Muscle weakness (generalized): Secondary | ICD-10-CM | POA: Diagnosis not present

## 2021-06-01 DIAGNOSIS — I2510 Atherosclerotic heart disease of native coronary artery without angina pectoris: Secondary | ICD-10-CM | POA: Diagnosis not present

## 2021-06-01 DIAGNOSIS — E1169 Type 2 diabetes mellitus with other specified complication: Secondary | ICD-10-CM | POA: Diagnosis not present

## 2021-06-04 DIAGNOSIS — I2510 Atherosclerotic heart disease of native coronary artery without angina pectoris: Secondary | ICD-10-CM | POA: Diagnosis not present

## 2021-06-04 DIAGNOSIS — M6281 Muscle weakness (generalized): Secondary | ICD-10-CM | POA: Diagnosis not present

## 2021-06-04 DIAGNOSIS — E1169 Type 2 diabetes mellitus with other specified complication: Secondary | ICD-10-CM | POA: Diagnosis not present

## 2021-06-04 DIAGNOSIS — M86152 Other acute osteomyelitis, left femur: Secondary | ICD-10-CM | POA: Diagnosis not present

## 2021-06-04 DIAGNOSIS — R278 Other lack of coordination: Secondary | ICD-10-CM | POA: Diagnosis not present

## 2021-06-04 DIAGNOSIS — Z9181 History of falling: Secondary | ICD-10-CM | POA: Diagnosis not present

## 2021-06-04 DIAGNOSIS — G20 Parkinson's disease: Secondary | ICD-10-CM | POA: Diagnosis not present

## 2021-06-04 DIAGNOSIS — Z96642 Presence of left artificial hip joint: Secondary | ICD-10-CM | POA: Diagnosis not present

## 2021-06-04 DIAGNOSIS — S72002S Fracture of unspecified part of neck of left femur, sequela: Secondary | ICD-10-CM | POA: Diagnosis not present

## 2021-06-05 DIAGNOSIS — Z96642 Presence of left artificial hip joint: Secondary | ICD-10-CM | POA: Diagnosis not present

## 2021-06-05 DIAGNOSIS — M86152 Other acute osteomyelitis, left femur: Secondary | ICD-10-CM | POA: Diagnosis not present

## 2021-06-05 DIAGNOSIS — E1169 Type 2 diabetes mellitus with other specified complication: Secondary | ICD-10-CM | POA: Diagnosis not present

## 2021-06-05 DIAGNOSIS — I2510 Atherosclerotic heart disease of native coronary artery without angina pectoris: Secondary | ICD-10-CM | POA: Diagnosis not present

## 2021-06-05 DIAGNOSIS — G20 Parkinson's disease: Secondary | ICD-10-CM | POA: Diagnosis not present

## 2021-06-05 DIAGNOSIS — S72002S Fracture of unspecified part of neck of left femur, sequela: Secondary | ICD-10-CM | POA: Diagnosis not present

## 2021-06-06 DIAGNOSIS — L308 Other specified dermatitis: Secondary | ICD-10-CM | POA: Diagnosis not present

## 2021-06-06 DIAGNOSIS — L89891 Pressure ulcer of other site, stage 1: Secondary | ICD-10-CM | POA: Diagnosis not present

## 2021-06-06 DIAGNOSIS — G2581 Restless legs syndrome: Secondary | ICD-10-CM | POA: Diagnosis not present

## 2021-06-06 DIAGNOSIS — I2510 Atherosclerotic heart disease of native coronary artery without angina pectoris: Secondary | ICD-10-CM | POA: Diagnosis not present

## 2021-06-06 DIAGNOSIS — R296 Repeated falls: Secondary | ICD-10-CM | POA: Diagnosis not present

## 2021-06-06 DIAGNOSIS — G20 Parkinson's disease: Secondary | ICD-10-CM | POA: Diagnosis not present

## 2021-06-06 DIAGNOSIS — E785 Hyperlipidemia, unspecified: Secondary | ICD-10-CM | POA: Diagnosis not present

## 2021-06-06 DIAGNOSIS — J9691 Respiratory failure, unspecified with hypoxia: Secondary | ICD-10-CM | POA: Diagnosis not present

## 2021-06-06 DIAGNOSIS — M6281 Muscle weakness (generalized): Secondary | ICD-10-CM | POA: Diagnosis not present

## 2021-06-06 DIAGNOSIS — N1831 Chronic kidney disease, stage 3a: Secondary | ICD-10-CM | POA: Diagnosis not present

## 2021-06-06 DIAGNOSIS — Z96642 Presence of left artificial hip joint: Secondary | ICD-10-CM | POA: Diagnosis not present

## 2021-06-06 DIAGNOSIS — E1169 Type 2 diabetes mellitus with other specified complication: Secondary | ICD-10-CM | POA: Diagnosis not present

## 2021-06-06 DIAGNOSIS — E1122 Type 2 diabetes mellitus with diabetic chronic kidney disease: Secondary | ICD-10-CM | POA: Diagnosis not present

## 2021-06-06 DIAGNOSIS — M86152 Other acute osteomyelitis, left femur: Secondary | ICD-10-CM | POA: Diagnosis not present

## 2021-06-06 DIAGNOSIS — E559 Vitamin D deficiency, unspecified: Secondary | ICD-10-CM | POA: Diagnosis not present

## 2021-06-06 DIAGNOSIS — E1142 Type 2 diabetes mellitus with diabetic polyneuropathy: Secondary | ICD-10-CM | POA: Diagnosis not present

## 2021-06-06 DIAGNOSIS — R1310 Dysphagia, unspecified: Secondary | ICD-10-CM | POA: Diagnosis not present

## 2021-06-06 DIAGNOSIS — G4733 Obstructive sleep apnea (adult) (pediatric): Secondary | ICD-10-CM | POA: Diagnosis not present

## 2021-06-06 DIAGNOSIS — D509 Iron deficiency anemia, unspecified: Secondary | ICD-10-CM | POA: Diagnosis not present

## 2021-06-06 DIAGNOSIS — I714 Abdominal aortic aneurysm, without rupture, unspecified: Secondary | ICD-10-CM | POA: Diagnosis not present

## 2021-06-06 DIAGNOSIS — I129 Hypertensive chronic kidney disease with stage 1 through stage 4 chronic kidney disease, or unspecified chronic kidney disease: Secondary | ICD-10-CM | POA: Diagnosis not present

## 2021-06-06 DIAGNOSIS — I4891 Unspecified atrial fibrillation: Secondary | ICD-10-CM | POA: Diagnosis not present

## 2021-06-06 DIAGNOSIS — S72002S Fracture of unspecified part of neck of left femur, sequela: Secondary | ICD-10-CM | POA: Diagnosis not present

## 2021-06-06 DIAGNOSIS — Z9181 History of falling: Secondary | ICD-10-CM | POA: Diagnosis not present

## 2021-06-06 DIAGNOSIS — N400 Enlarged prostate without lower urinary tract symptoms: Secondary | ICD-10-CM | POA: Diagnosis not present

## 2021-06-06 DIAGNOSIS — R278 Other lack of coordination: Secondary | ICD-10-CM | POA: Diagnosis not present

## 2021-06-06 DIAGNOSIS — R634 Abnormal weight loss: Secondary | ICD-10-CM | POA: Diagnosis not present

## 2021-06-06 DIAGNOSIS — F419 Anxiety disorder, unspecified: Secondary | ICD-10-CM | POA: Diagnosis not present

## 2021-06-07 DIAGNOSIS — Z9181 History of falling: Secondary | ICD-10-CM | POA: Diagnosis not present

## 2021-06-07 DIAGNOSIS — M6281 Muscle weakness (generalized): Secondary | ICD-10-CM | POA: Diagnosis not present

## 2021-06-07 DIAGNOSIS — L89890 Pressure ulcer of other site, unstageable: Secondary | ICD-10-CM | POA: Diagnosis not present

## 2021-06-07 DIAGNOSIS — G20 Parkinson's disease: Secondary | ICD-10-CM | POA: Diagnosis not present

## 2021-06-07 DIAGNOSIS — S72002S Fracture of unspecified part of neck of left femur, sequela: Secondary | ICD-10-CM | POA: Diagnosis not present

## 2021-06-07 DIAGNOSIS — Z96642 Presence of left artificial hip joint: Secondary | ICD-10-CM | POA: Diagnosis not present

## 2021-06-07 DIAGNOSIS — R278 Other lack of coordination: Secondary | ICD-10-CM | POA: Diagnosis not present

## 2021-06-07 DIAGNOSIS — I2510 Atherosclerotic heart disease of native coronary artery without angina pectoris: Secondary | ICD-10-CM | POA: Diagnosis not present

## 2021-06-07 DIAGNOSIS — L89896 Pressure-induced deep tissue damage of other site: Secondary | ICD-10-CM | POA: Diagnosis not present

## 2021-06-07 DIAGNOSIS — M86152 Other acute osteomyelitis, left femur: Secondary | ICD-10-CM | POA: Diagnosis not present

## 2021-06-07 DIAGNOSIS — E1169 Type 2 diabetes mellitus with other specified complication: Secondary | ICD-10-CM | POA: Diagnosis not present

## 2021-06-08 DIAGNOSIS — S72002S Fracture of unspecified part of neck of left femur, sequela: Secondary | ICD-10-CM | POA: Diagnosis not present

## 2021-06-08 DIAGNOSIS — M6281 Muscle weakness (generalized): Secondary | ICD-10-CM | POA: Diagnosis not present

## 2021-06-08 DIAGNOSIS — M86152 Other acute osteomyelitis, left femur: Secondary | ICD-10-CM | POA: Diagnosis not present

## 2021-06-08 DIAGNOSIS — Z96642 Presence of left artificial hip joint: Secondary | ICD-10-CM | POA: Diagnosis not present

## 2021-06-08 DIAGNOSIS — E1169 Type 2 diabetes mellitus with other specified complication: Secondary | ICD-10-CM | POA: Diagnosis not present

## 2021-06-08 DIAGNOSIS — G20 Parkinson's disease: Secondary | ICD-10-CM | POA: Diagnosis not present

## 2021-06-08 DIAGNOSIS — I2510 Atherosclerotic heart disease of native coronary artery without angina pectoris: Secondary | ICD-10-CM | POA: Diagnosis not present

## 2021-06-08 DIAGNOSIS — Z9181 History of falling: Secondary | ICD-10-CM | POA: Diagnosis not present

## 2021-06-08 DIAGNOSIS — R278 Other lack of coordination: Secondary | ICD-10-CM | POA: Diagnosis not present

## 2021-06-09 DIAGNOSIS — G20 Parkinson's disease: Secondary | ICD-10-CM | POA: Diagnosis not present

## 2021-06-09 DIAGNOSIS — E1169 Type 2 diabetes mellitus with other specified complication: Secondary | ICD-10-CM | POA: Diagnosis not present

## 2021-06-09 DIAGNOSIS — M86152 Other acute osteomyelitis, left femur: Secondary | ICD-10-CM | POA: Diagnosis not present

## 2021-06-09 DIAGNOSIS — Z96642 Presence of left artificial hip joint: Secondary | ICD-10-CM | POA: Diagnosis not present

## 2021-06-09 DIAGNOSIS — S72002S Fracture of unspecified part of neck of left femur, sequela: Secondary | ICD-10-CM | POA: Diagnosis not present

## 2021-06-09 DIAGNOSIS — I2510 Atherosclerotic heart disease of native coronary artery without angina pectoris: Secondary | ICD-10-CM | POA: Diagnosis not present

## 2021-06-10 DIAGNOSIS — M86152 Other acute osteomyelitis, left femur: Secondary | ICD-10-CM | POA: Diagnosis not present

## 2021-06-10 DIAGNOSIS — E1169 Type 2 diabetes mellitus with other specified complication: Secondary | ICD-10-CM | POA: Diagnosis not present

## 2021-06-10 DIAGNOSIS — S72002S Fracture of unspecified part of neck of left femur, sequela: Secondary | ICD-10-CM | POA: Diagnosis not present

## 2021-06-10 DIAGNOSIS — G20 Parkinson's disease: Secondary | ICD-10-CM | POA: Diagnosis not present

## 2021-06-10 DIAGNOSIS — I2510 Atherosclerotic heart disease of native coronary artery without angina pectoris: Secondary | ICD-10-CM | POA: Diagnosis not present

## 2021-06-10 DIAGNOSIS — Z96642 Presence of left artificial hip joint: Secondary | ICD-10-CM | POA: Diagnosis not present

## 2021-06-11 DIAGNOSIS — R278 Other lack of coordination: Secondary | ICD-10-CM | POA: Diagnosis not present

## 2021-06-11 DIAGNOSIS — M86152 Other acute osteomyelitis, left femur: Secondary | ICD-10-CM | POA: Diagnosis not present

## 2021-06-11 DIAGNOSIS — G20 Parkinson's disease: Secondary | ICD-10-CM | POA: Diagnosis not present

## 2021-06-11 DIAGNOSIS — S72002S Fracture of unspecified part of neck of left femur, sequela: Secondary | ICD-10-CM | POA: Diagnosis not present

## 2021-06-11 DIAGNOSIS — Z9181 History of falling: Secondary | ICD-10-CM | POA: Diagnosis not present

## 2021-06-11 DIAGNOSIS — I2510 Atherosclerotic heart disease of native coronary artery without angina pectoris: Secondary | ICD-10-CM | POA: Diagnosis not present

## 2021-06-11 DIAGNOSIS — E1169 Type 2 diabetes mellitus with other specified complication: Secondary | ICD-10-CM | POA: Diagnosis not present

## 2021-06-11 DIAGNOSIS — Z96642 Presence of left artificial hip joint: Secondary | ICD-10-CM | POA: Diagnosis not present

## 2021-06-11 DIAGNOSIS — M6281 Muscle weakness (generalized): Secondary | ICD-10-CM | POA: Diagnosis not present

## 2021-06-12 DIAGNOSIS — M6281 Muscle weakness (generalized): Secondary | ICD-10-CM | POA: Diagnosis not present

## 2021-06-12 DIAGNOSIS — L89894 Pressure ulcer of other site, stage 4: Secondary | ICD-10-CM | POA: Diagnosis not present

## 2021-06-12 DIAGNOSIS — R278 Other lack of coordination: Secondary | ICD-10-CM | POA: Diagnosis not present

## 2021-06-12 DIAGNOSIS — Z9181 History of falling: Secondary | ICD-10-CM | POA: Diagnosis not present

## 2021-06-12 DIAGNOSIS — L89896 Pressure-induced deep tissue damage of other site: Secondary | ICD-10-CM | POA: Diagnosis not present

## 2021-06-13 DIAGNOSIS — M6281 Muscle weakness (generalized): Secondary | ICD-10-CM | POA: Diagnosis not present

## 2021-06-13 DIAGNOSIS — Z9181 History of falling: Secondary | ICD-10-CM | POA: Diagnosis not present

## 2021-06-13 DIAGNOSIS — R278 Other lack of coordination: Secondary | ICD-10-CM | POA: Diagnosis not present

## 2021-06-14 DIAGNOSIS — R278 Other lack of coordination: Secondary | ICD-10-CM | POA: Diagnosis not present

## 2021-06-14 DIAGNOSIS — M6281 Muscle weakness (generalized): Secondary | ICD-10-CM | POA: Diagnosis not present

## 2021-06-14 DIAGNOSIS — Z9181 History of falling: Secondary | ICD-10-CM | POA: Diagnosis not present

## 2021-06-15 DIAGNOSIS — S72002S Fracture of unspecified part of neck of left femur, sequela: Secondary | ICD-10-CM | POA: Diagnosis not present

## 2021-06-15 DIAGNOSIS — Z96642 Presence of left artificial hip joint: Secondary | ICD-10-CM | POA: Diagnosis not present

## 2021-06-15 DIAGNOSIS — R278 Other lack of coordination: Secondary | ICD-10-CM | POA: Diagnosis not present

## 2021-06-15 DIAGNOSIS — M6281 Muscle weakness (generalized): Secondary | ICD-10-CM | POA: Diagnosis not present

## 2021-06-15 DIAGNOSIS — G20 Parkinson's disease: Secondary | ICD-10-CM | POA: Diagnosis not present

## 2021-06-15 DIAGNOSIS — M86152 Other acute osteomyelitis, left femur: Secondary | ICD-10-CM | POA: Diagnosis not present

## 2021-06-15 DIAGNOSIS — I2510 Atherosclerotic heart disease of native coronary artery without angina pectoris: Secondary | ICD-10-CM | POA: Diagnosis not present

## 2021-06-15 DIAGNOSIS — E1169 Type 2 diabetes mellitus with other specified complication: Secondary | ICD-10-CM | POA: Diagnosis not present

## 2021-06-15 DIAGNOSIS — Z9181 History of falling: Secondary | ICD-10-CM | POA: Diagnosis not present

## 2021-06-16 DIAGNOSIS — E1169 Type 2 diabetes mellitus with other specified complication: Secondary | ICD-10-CM | POA: Diagnosis not present

## 2021-06-16 DIAGNOSIS — S72002S Fracture of unspecified part of neck of left femur, sequela: Secondary | ICD-10-CM | POA: Diagnosis not present

## 2021-06-16 DIAGNOSIS — M86152 Other acute osteomyelitis, left femur: Secondary | ICD-10-CM | POA: Diagnosis not present

## 2021-06-16 DIAGNOSIS — G20 Parkinson's disease: Secondary | ICD-10-CM | POA: Diagnosis not present

## 2021-06-16 DIAGNOSIS — Z96642 Presence of left artificial hip joint: Secondary | ICD-10-CM | POA: Diagnosis not present

## 2021-06-16 DIAGNOSIS — I2510 Atherosclerotic heart disease of native coronary artery without angina pectoris: Secondary | ICD-10-CM | POA: Diagnosis not present

## 2021-06-18 DIAGNOSIS — G20 Parkinson's disease: Secondary | ICD-10-CM | POA: Diagnosis not present

## 2021-06-18 DIAGNOSIS — I2510 Atherosclerotic heart disease of native coronary artery without angina pectoris: Secondary | ICD-10-CM | POA: Diagnosis not present

## 2021-06-18 DIAGNOSIS — R278 Other lack of coordination: Secondary | ICD-10-CM | POA: Diagnosis not present

## 2021-06-18 DIAGNOSIS — E1169 Type 2 diabetes mellitus with other specified complication: Secondary | ICD-10-CM | POA: Diagnosis not present

## 2021-06-18 DIAGNOSIS — M6281 Muscle weakness (generalized): Secondary | ICD-10-CM | POA: Diagnosis not present

## 2021-06-18 DIAGNOSIS — Z9181 History of falling: Secondary | ICD-10-CM | POA: Diagnosis not present

## 2021-06-18 DIAGNOSIS — Z96642 Presence of left artificial hip joint: Secondary | ICD-10-CM | POA: Diagnosis not present

## 2021-06-18 DIAGNOSIS — M86152 Other acute osteomyelitis, left femur: Secondary | ICD-10-CM | POA: Diagnosis not present

## 2021-06-18 DIAGNOSIS — S72002S Fracture of unspecified part of neck of left femur, sequela: Secondary | ICD-10-CM | POA: Diagnosis not present

## 2021-06-19 DIAGNOSIS — Z96642 Presence of left artificial hip joint: Secondary | ICD-10-CM | POA: Diagnosis not present

## 2021-06-19 DIAGNOSIS — M86152 Other acute osteomyelitis, left femur: Secondary | ICD-10-CM | POA: Diagnosis not present

## 2021-06-19 DIAGNOSIS — S72002S Fracture of unspecified part of neck of left femur, sequela: Secondary | ICD-10-CM | POA: Diagnosis not present

## 2021-06-19 DIAGNOSIS — L89894 Pressure ulcer of other site, stage 4: Secondary | ICD-10-CM | POA: Diagnosis not present

## 2021-06-19 DIAGNOSIS — L89896 Pressure-induced deep tissue damage of other site: Secondary | ICD-10-CM | POA: Diagnosis not present

## 2021-06-19 DIAGNOSIS — I2510 Atherosclerotic heart disease of native coronary artery without angina pectoris: Secondary | ICD-10-CM | POA: Diagnosis not present

## 2021-06-19 DIAGNOSIS — G20 Parkinson's disease: Secondary | ICD-10-CM | POA: Diagnosis not present

## 2021-06-19 DIAGNOSIS — E1169 Type 2 diabetes mellitus with other specified complication: Secondary | ICD-10-CM | POA: Diagnosis not present

## 2021-06-22 DIAGNOSIS — M86152 Other acute osteomyelitis, left femur: Secondary | ICD-10-CM | POA: Diagnosis not present

## 2021-06-22 DIAGNOSIS — Z96642 Presence of left artificial hip joint: Secondary | ICD-10-CM | POA: Diagnosis not present

## 2021-06-22 DIAGNOSIS — S72002S Fracture of unspecified part of neck of left femur, sequela: Secondary | ICD-10-CM | POA: Diagnosis not present

## 2021-06-22 DIAGNOSIS — G20 Parkinson's disease: Secondary | ICD-10-CM | POA: Diagnosis not present

## 2021-06-22 DIAGNOSIS — E1169 Type 2 diabetes mellitus with other specified complication: Secondary | ICD-10-CM | POA: Diagnosis not present

## 2021-06-22 DIAGNOSIS — I2510 Atherosclerotic heart disease of native coronary artery without angina pectoris: Secondary | ICD-10-CM | POA: Diagnosis not present

## 2021-06-24 DIAGNOSIS — S72002S Fracture of unspecified part of neck of left femur, sequela: Secondary | ICD-10-CM | POA: Diagnosis not present

## 2021-06-24 DIAGNOSIS — I2510 Atherosclerotic heart disease of native coronary artery without angina pectoris: Secondary | ICD-10-CM | POA: Diagnosis not present

## 2021-06-24 DIAGNOSIS — G20 Parkinson's disease: Secondary | ICD-10-CM | POA: Diagnosis not present

## 2021-06-24 DIAGNOSIS — M86152 Other acute osteomyelitis, left femur: Secondary | ICD-10-CM | POA: Diagnosis not present

## 2021-06-24 DIAGNOSIS — E1169 Type 2 diabetes mellitus with other specified complication: Secondary | ICD-10-CM | POA: Diagnosis not present

## 2021-06-24 DIAGNOSIS — Z96642 Presence of left artificial hip joint: Secondary | ICD-10-CM | POA: Diagnosis not present

## 2021-06-25 DIAGNOSIS — M86152 Other acute osteomyelitis, left femur: Secondary | ICD-10-CM | POA: Diagnosis not present

## 2021-06-25 DIAGNOSIS — G20 Parkinson's disease: Secondary | ICD-10-CM | POA: Diagnosis not present

## 2021-06-25 DIAGNOSIS — Z96642 Presence of left artificial hip joint: Secondary | ICD-10-CM | POA: Diagnosis not present

## 2021-06-25 DIAGNOSIS — I2510 Atherosclerotic heart disease of native coronary artery without angina pectoris: Secondary | ICD-10-CM | POA: Diagnosis not present

## 2021-06-25 DIAGNOSIS — E1169 Type 2 diabetes mellitus with other specified complication: Secondary | ICD-10-CM | POA: Diagnosis not present

## 2021-06-25 DIAGNOSIS — S72002S Fracture of unspecified part of neck of left femur, sequela: Secondary | ICD-10-CM | POA: Diagnosis not present

## 2021-06-26 DIAGNOSIS — L89896 Pressure-induced deep tissue damage of other site: Secondary | ICD-10-CM | POA: Diagnosis not present

## 2021-06-26 DIAGNOSIS — I739 Peripheral vascular disease, unspecified: Secondary | ICD-10-CM | POA: Diagnosis not present

## 2021-06-26 DIAGNOSIS — B351 Tinea unguium: Secondary | ICD-10-CM | POA: Diagnosis not present

## 2021-06-26 DIAGNOSIS — L89894 Pressure ulcer of other site, stage 4: Secondary | ICD-10-CM | POA: Diagnosis not present

## 2021-06-26 DIAGNOSIS — Q845 Enlarged and hypertrophic nails: Secondary | ICD-10-CM | POA: Diagnosis not present

## 2021-06-28 DIAGNOSIS — G20 Parkinson's disease: Secondary | ICD-10-CM | POA: Diagnosis not present

## 2021-06-28 DIAGNOSIS — S72002S Fracture of unspecified part of neck of left femur, sequela: Secondary | ICD-10-CM | POA: Diagnosis not present

## 2021-06-28 DIAGNOSIS — I2510 Atherosclerotic heart disease of native coronary artery without angina pectoris: Secondary | ICD-10-CM | POA: Diagnosis not present

## 2021-06-28 DIAGNOSIS — E1169 Type 2 diabetes mellitus with other specified complication: Secondary | ICD-10-CM | POA: Diagnosis not present

## 2021-06-28 DIAGNOSIS — Z96642 Presence of left artificial hip joint: Secondary | ICD-10-CM | POA: Diagnosis not present

## 2021-06-28 DIAGNOSIS — M86152 Other acute osteomyelitis, left femur: Secondary | ICD-10-CM | POA: Diagnosis not present

## 2021-06-29 DIAGNOSIS — Z96642 Presence of left artificial hip joint: Secondary | ICD-10-CM | POA: Diagnosis not present

## 2021-06-29 DIAGNOSIS — I480 Paroxysmal atrial fibrillation: Secondary | ICD-10-CM | POA: Diagnosis not present

## 2021-06-29 DIAGNOSIS — E1122 Type 2 diabetes mellitus with diabetic chronic kidney disease: Secondary | ICD-10-CM | POA: Diagnosis not present

## 2021-06-29 DIAGNOSIS — I2510 Atherosclerotic heart disease of native coronary artery without angina pectoris: Secondary | ICD-10-CM | POA: Diagnosis not present

## 2021-06-29 DIAGNOSIS — G20 Parkinson's disease: Secondary | ICD-10-CM | POA: Diagnosis not present

## 2021-06-29 DIAGNOSIS — M86152 Other acute osteomyelitis, left femur: Secondary | ICD-10-CM | POA: Diagnosis not present

## 2021-06-29 DIAGNOSIS — M6281 Muscle weakness (generalized): Secondary | ICD-10-CM | POA: Diagnosis not present

## 2021-06-29 DIAGNOSIS — E1169 Type 2 diabetes mellitus with other specified complication: Secondary | ICD-10-CM | POA: Diagnosis not present

## 2021-06-29 DIAGNOSIS — S72002S Fracture of unspecified part of neck of left femur, sequela: Secondary | ICD-10-CM | POA: Diagnosis not present

## 2021-06-29 DIAGNOSIS — R296 Repeated falls: Secondary | ICD-10-CM | POA: Diagnosis not present

## 2021-06-29 DIAGNOSIS — M869 Osteomyelitis, unspecified: Secondary | ICD-10-CM | POA: Diagnosis not present

## 2021-07-02 DIAGNOSIS — S72002S Fracture of unspecified part of neck of left femur, sequela: Secondary | ICD-10-CM | POA: Diagnosis not present

## 2021-07-02 DIAGNOSIS — E1169 Type 2 diabetes mellitus with other specified complication: Secondary | ICD-10-CM | POA: Diagnosis not present

## 2021-07-02 DIAGNOSIS — Z96642 Presence of left artificial hip joint: Secondary | ICD-10-CM | POA: Diagnosis not present

## 2021-07-02 DIAGNOSIS — G20 Parkinson's disease: Secondary | ICD-10-CM | POA: Diagnosis not present

## 2021-07-02 DIAGNOSIS — M86152 Other acute osteomyelitis, left femur: Secondary | ICD-10-CM | POA: Diagnosis not present

## 2021-07-02 DIAGNOSIS — I2510 Atherosclerotic heart disease of native coronary artery without angina pectoris: Secondary | ICD-10-CM | POA: Diagnosis not present

## 2021-07-03 DIAGNOSIS — L89894 Pressure ulcer of other site, stage 4: Secondary | ICD-10-CM | POA: Diagnosis not present

## 2021-07-03 DIAGNOSIS — L89896 Pressure-induced deep tissue damage of other site: Secondary | ICD-10-CM | POA: Diagnosis not present

## 2021-07-04 DIAGNOSIS — Z96642 Presence of left artificial hip joint: Secondary | ICD-10-CM | POA: Diagnosis not present

## 2021-07-04 DIAGNOSIS — G20 Parkinson's disease: Secondary | ICD-10-CM | POA: Diagnosis not present

## 2021-07-04 DIAGNOSIS — M86152 Other acute osteomyelitis, left femur: Secondary | ICD-10-CM | POA: Diagnosis not present

## 2021-07-04 DIAGNOSIS — E1169 Type 2 diabetes mellitus with other specified complication: Secondary | ICD-10-CM | POA: Diagnosis not present

## 2021-07-04 DIAGNOSIS — S72002S Fracture of unspecified part of neck of left femur, sequela: Secondary | ICD-10-CM | POA: Diagnosis not present

## 2021-07-04 DIAGNOSIS — I2510 Atherosclerotic heart disease of native coronary artery without angina pectoris: Secondary | ICD-10-CM | POA: Diagnosis not present

## 2021-07-06 DIAGNOSIS — E1169 Type 2 diabetes mellitus with other specified complication: Secondary | ICD-10-CM | POA: Diagnosis not present

## 2021-07-06 DIAGNOSIS — G20 Parkinson's disease: Secondary | ICD-10-CM | POA: Diagnosis not present

## 2021-07-06 DIAGNOSIS — Z96642 Presence of left artificial hip joint: Secondary | ICD-10-CM | POA: Diagnosis not present

## 2021-07-06 DIAGNOSIS — S72002S Fracture of unspecified part of neck of left femur, sequela: Secondary | ICD-10-CM | POA: Diagnosis not present

## 2021-07-06 DIAGNOSIS — I2510 Atherosclerotic heart disease of native coronary artery without angina pectoris: Secondary | ICD-10-CM | POA: Diagnosis not present

## 2021-07-06 DIAGNOSIS — M86152 Other acute osteomyelitis, left femur: Secondary | ICD-10-CM | POA: Diagnosis not present

## 2021-07-07 DIAGNOSIS — F419 Anxiety disorder, unspecified: Secondary | ICD-10-CM | POA: Diagnosis not present

## 2021-07-07 DIAGNOSIS — E1169 Type 2 diabetes mellitus with other specified complication: Secondary | ICD-10-CM | POA: Diagnosis not present

## 2021-07-07 DIAGNOSIS — D509 Iron deficiency anemia, unspecified: Secondary | ICD-10-CM | POA: Diagnosis not present

## 2021-07-07 DIAGNOSIS — M86152 Other acute osteomyelitis, left femur: Secondary | ICD-10-CM | POA: Diagnosis not present

## 2021-07-07 DIAGNOSIS — E785 Hyperlipidemia, unspecified: Secondary | ICD-10-CM | POA: Diagnosis not present

## 2021-07-07 DIAGNOSIS — I2510 Atherosclerotic heart disease of native coronary artery without angina pectoris: Secondary | ICD-10-CM | POA: Diagnosis not present

## 2021-07-07 DIAGNOSIS — L308 Other specified dermatitis: Secondary | ICD-10-CM | POA: Diagnosis not present

## 2021-07-07 DIAGNOSIS — R296 Repeated falls: Secondary | ICD-10-CM | POA: Diagnosis not present

## 2021-07-07 DIAGNOSIS — R634 Abnormal weight loss: Secondary | ICD-10-CM | POA: Diagnosis not present

## 2021-07-07 DIAGNOSIS — E1122 Type 2 diabetes mellitus with diabetic chronic kidney disease: Secondary | ICD-10-CM | POA: Diagnosis not present

## 2021-07-07 DIAGNOSIS — E559 Vitamin D deficiency, unspecified: Secondary | ICD-10-CM | POA: Diagnosis not present

## 2021-07-07 DIAGNOSIS — E1142 Type 2 diabetes mellitus with diabetic polyneuropathy: Secondary | ICD-10-CM | POA: Diagnosis not present

## 2021-07-07 DIAGNOSIS — R1310 Dysphagia, unspecified: Secondary | ICD-10-CM | POA: Diagnosis not present

## 2021-07-07 DIAGNOSIS — N400 Enlarged prostate without lower urinary tract symptoms: Secondary | ICD-10-CM | POA: Diagnosis not present

## 2021-07-07 DIAGNOSIS — Z96642 Presence of left artificial hip joint: Secondary | ICD-10-CM | POA: Diagnosis not present

## 2021-07-07 DIAGNOSIS — I129 Hypertensive chronic kidney disease with stage 1 through stage 4 chronic kidney disease, or unspecified chronic kidney disease: Secondary | ICD-10-CM | POA: Diagnosis not present

## 2021-07-07 DIAGNOSIS — S72002S Fracture of unspecified part of neck of left femur, sequela: Secondary | ICD-10-CM | POA: Diagnosis not present

## 2021-07-07 DIAGNOSIS — G4733 Obstructive sleep apnea (adult) (pediatric): Secondary | ICD-10-CM | POA: Diagnosis not present

## 2021-07-07 DIAGNOSIS — G20 Parkinson's disease: Secondary | ICD-10-CM | POA: Diagnosis not present

## 2021-07-07 DIAGNOSIS — I4891 Unspecified atrial fibrillation: Secondary | ICD-10-CM | POA: Diagnosis not present

## 2021-07-07 DIAGNOSIS — J9691 Respiratory failure, unspecified with hypoxia: Secondary | ICD-10-CM | POA: Diagnosis not present

## 2021-07-07 DIAGNOSIS — L89891 Pressure ulcer of other site, stage 1: Secondary | ICD-10-CM | POA: Diagnosis not present

## 2021-07-07 DIAGNOSIS — N1831 Chronic kidney disease, stage 3a: Secondary | ICD-10-CM | POA: Diagnosis not present

## 2021-07-07 DIAGNOSIS — G2581 Restless legs syndrome: Secondary | ICD-10-CM | POA: Diagnosis not present

## 2021-07-07 DIAGNOSIS — I714 Abdominal aortic aneurysm, without rupture, unspecified: Secondary | ICD-10-CM | POA: Diagnosis not present

## 2021-07-09 DIAGNOSIS — I2510 Atherosclerotic heart disease of native coronary artery without angina pectoris: Secondary | ICD-10-CM | POA: Diagnosis not present

## 2021-07-09 DIAGNOSIS — M86152 Other acute osteomyelitis, left femur: Secondary | ICD-10-CM | POA: Diagnosis not present

## 2021-07-09 DIAGNOSIS — E1169 Type 2 diabetes mellitus with other specified complication: Secondary | ICD-10-CM | POA: Diagnosis not present

## 2021-07-09 DIAGNOSIS — G20 Parkinson's disease: Secondary | ICD-10-CM | POA: Diagnosis not present

## 2021-07-09 DIAGNOSIS — S72002S Fracture of unspecified part of neck of left femur, sequela: Secondary | ICD-10-CM | POA: Diagnosis not present

## 2021-07-09 DIAGNOSIS — Z96642 Presence of left artificial hip joint: Secondary | ICD-10-CM | POA: Diagnosis not present

## 2021-07-10 DIAGNOSIS — Z96642 Presence of left artificial hip joint: Secondary | ICD-10-CM | POA: Diagnosis not present

## 2021-07-10 DIAGNOSIS — E1169 Type 2 diabetes mellitus with other specified complication: Secondary | ICD-10-CM | POA: Diagnosis not present

## 2021-07-10 DIAGNOSIS — G20 Parkinson's disease: Secondary | ICD-10-CM | POA: Diagnosis not present

## 2021-07-10 DIAGNOSIS — L89896 Pressure-induced deep tissue damage of other site: Secondary | ICD-10-CM | POA: Diagnosis not present

## 2021-07-10 DIAGNOSIS — L89894 Pressure ulcer of other site, stage 4: Secondary | ICD-10-CM | POA: Diagnosis not present

## 2021-07-10 DIAGNOSIS — M86152 Other acute osteomyelitis, left femur: Secondary | ICD-10-CM | POA: Diagnosis not present

## 2021-07-10 DIAGNOSIS — I2510 Atherosclerotic heart disease of native coronary artery without angina pectoris: Secondary | ICD-10-CM | POA: Diagnosis not present

## 2021-07-10 DIAGNOSIS — S72002S Fracture of unspecified part of neck of left femur, sequela: Secondary | ICD-10-CM | POA: Diagnosis not present

## 2021-07-13 DIAGNOSIS — G20 Parkinson's disease: Secondary | ICD-10-CM | POA: Diagnosis not present

## 2021-07-13 DIAGNOSIS — I2510 Atherosclerotic heart disease of native coronary artery without angina pectoris: Secondary | ICD-10-CM | POA: Diagnosis not present

## 2021-07-13 DIAGNOSIS — E1169 Type 2 diabetes mellitus with other specified complication: Secondary | ICD-10-CM | POA: Diagnosis not present

## 2021-07-13 DIAGNOSIS — Z96642 Presence of left artificial hip joint: Secondary | ICD-10-CM | POA: Diagnosis not present

## 2021-07-13 DIAGNOSIS — M86152 Other acute osteomyelitis, left femur: Secondary | ICD-10-CM | POA: Diagnosis not present

## 2021-07-13 DIAGNOSIS — S72002S Fracture of unspecified part of neck of left femur, sequela: Secondary | ICD-10-CM | POA: Diagnosis not present

## 2021-08-06 DEATH — deceased
